# Patient Record
Sex: Female | Born: 1985 | Race: White | Hispanic: No | Marital: Married | Smoking: Never smoker
Health system: Southern US, Community
[De-identification: ages and names within clinical notes are randomized; demographics above are authoritative.]

## PROBLEM LIST (undated history)

## (undated) ENCOUNTER — Inpatient Hospital Stay (HOSPITAL_COMMUNITY): Payer: Self-pay

## (undated) VITALS — BP 101/66 | HR 87 | Temp 97.6°F | Resp 16 | Ht 66.0 in | Wt 226.0 lb

## (undated) VITALS — BP 118/67 | HR 91 | Temp 98.1°F | Resp 18 | Ht 66.0 in | Wt 265.0 lb

## (undated) DIAGNOSIS — F329 Major depressive disorder, single episode, unspecified: Secondary | ICD-10-CM

## (undated) DIAGNOSIS — F909 Attention-deficit hyperactivity disorder, unspecified type: Secondary | ICD-10-CM

## (undated) DIAGNOSIS — O1400 Mild to moderate pre-eclampsia, unspecified trimester: Secondary | ICD-10-CM

## (undated) DIAGNOSIS — Z8619 Personal history of other infectious and parasitic diseases: Secondary | ICD-10-CM

## (undated) DIAGNOSIS — K2970 Gastritis, unspecified, without bleeding: Secondary | ICD-10-CM

## (undated) DIAGNOSIS — K219 Gastro-esophageal reflux disease without esophagitis: Secondary | ICD-10-CM

## (undated) DIAGNOSIS — R87629 Unspecified abnormal cytological findings in specimens from vagina: Secondary | ICD-10-CM

## (undated) DIAGNOSIS — F319 Bipolar disorder, unspecified: Secondary | ICD-10-CM

## (undated) DIAGNOSIS — F32A Depression, unspecified: Secondary | ICD-10-CM

## (undated) DIAGNOSIS — O139 Gestational [pregnancy-induced] hypertension without significant proteinuria, unspecified trimester: Secondary | ICD-10-CM

## (undated) DIAGNOSIS — F419 Anxiety disorder, unspecified: Secondary | ICD-10-CM

## (undated) DIAGNOSIS — A630 Anogenital (venereal) warts: Secondary | ICD-10-CM

## (undated) HISTORY — PX: MOUTH SURGERY: SHX715

## (undated) HISTORY — DX: Anogenital (venereal) warts: A63.0

## (undated) HISTORY — DX: Gastro-esophageal reflux disease without esophagitis: K21.9

## (undated) HISTORY — PX: PLANTAR FASCIA SURGERY: SHX746

## (undated) HISTORY — DX: Unspecified abnormal cytological findings in specimens from vagina: R87.629

## (undated) HISTORY — PX: WISDOM TOOTH EXTRACTION: SHX21

## (undated) HISTORY — DX: Personal history of other infectious and parasitic diseases: Z86.19

## (undated) HISTORY — DX: Gastritis, unspecified, without bleeding: K29.70

## (undated) HISTORY — PX: EYE MUSCLE SURGERY: SHX370

---
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
## 2006-01-05 ENCOUNTER — Emergency Department (HOSPITAL_COMMUNITY): Admission: EM | Admit: 2006-01-05 | Discharge: 2006-01-05 | Payer: Self-pay | Admitting: Family Medicine

## 2006-02-19 ENCOUNTER — Ambulatory Visit (HOSPITAL_COMMUNITY): Payer: Self-pay | Admitting: Psychiatry

## 2006-09-07 ENCOUNTER — Emergency Department (HOSPITAL_COMMUNITY): Admission: EM | Admit: 2006-09-07 | Discharge: 2006-09-07 | Payer: Self-pay | Admitting: Emergency Medicine

## 2006-09-23 ENCOUNTER — Ambulatory Visit: Payer: Self-pay | Admitting: Obstetrics & Gynecology

## 2006-10-04 ENCOUNTER — Ambulatory Visit: Payer: Self-pay | Admitting: Psychiatry

## 2006-10-04 ENCOUNTER — Inpatient Hospital Stay (HOSPITAL_COMMUNITY): Admission: EM | Admit: 2006-10-04 | Discharge: 2006-10-08 | Payer: Self-pay | Admitting: Psychiatry

## 2006-10-14 ENCOUNTER — Ambulatory Visit (HOSPITAL_COMMUNITY): Payer: Self-pay | Admitting: Psychiatry

## 2006-11-13 ENCOUNTER — Ambulatory Visit (HOSPITAL_COMMUNITY): Payer: Self-pay | Admitting: Psychiatry

## 2007-01-01 ENCOUNTER — Emergency Department (HOSPITAL_COMMUNITY): Admission: EM | Admit: 2007-01-01 | Discharge: 2007-01-02 | Payer: Self-pay | Admitting: Emergency Medicine

## 2007-01-06 ENCOUNTER — Ambulatory Visit (HOSPITAL_COMMUNITY): Payer: Self-pay | Admitting: Psychiatry

## 2007-01-25 ENCOUNTER — Ambulatory Visit (HOSPITAL_COMMUNITY): Payer: Self-pay | Admitting: Psychiatry

## 2007-03-08 ENCOUNTER — Ambulatory Visit (HOSPITAL_COMMUNITY): Payer: Self-pay | Admitting: Psychiatry

## 2007-03-24 ENCOUNTER — Emergency Department (HOSPITAL_COMMUNITY): Admission: EM | Admit: 2007-03-24 | Discharge: 2007-03-24 | Payer: Self-pay | Admitting: Family Medicine

## 2007-04-05 ENCOUNTER — Ambulatory Visit (HOSPITAL_COMMUNITY): Payer: Self-pay | Admitting: Psychiatry

## 2007-05-01 ENCOUNTER — Ambulatory Visit: Payer: Self-pay | Admitting: Psychiatry

## 2007-05-01 ENCOUNTER — Inpatient Hospital Stay (HOSPITAL_COMMUNITY): Admission: AD | Admit: 2007-05-01 | Discharge: 2007-05-03 | Payer: Self-pay | Admitting: Psychiatry

## 2007-05-05 ENCOUNTER — Ambulatory Visit (HOSPITAL_COMMUNITY): Payer: Self-pay | Admitting: Psychiatry

## 2007-06-23 ENCOUNTER — Ambulatory Visit (HOSPITAL_COMMUNITY): Payer: Self-pay | Admitting: Psychiatry

## 2007-07-28 ENCOUNTER — Ambulatory Visit (HOSPITAL_COMMUNITY): Payer: Self-pay | Admitting: Psychiatry

## 2007-09-01 ENCOUNTER — Ambulatory Visit (HOSPITAL_COMMUNITY): Payer: Self-pay | Admitting: Psychiatry

## 2007-09-15 ENCOUNTER — Inpatient Hospital Stay (HOSPITAL_COMMUNITY): Admission: RE | Admit: 2007-09-15 | Discharge: 2007-09-17 | Payer: Self-pay | Admitting: *Deleted

## 2007-09-20 ENCOUNTER — Ambulatory Visit (HOSPITAL_COMMUNITY): Payer: Self-pay | Admitting: Psychiatry

## 2007-09-22 ENCOUNTER — Ambulatory Visit: Payer: Self-pay | Admitting: *Deleted

## 2007-10-11 ENCOUNTER — Ambulatory Visit (HOSPITAL_COMMUNITY): Payer: Self-pay | Admitting: Psychiatry

## 2007-11-03 ENCOUNTER — Emergency Department (HOSPITAL_COMMUNITY): Admission: EM | Admit: 2007-11-03 | Discharge: 2007-11-03 | Payer: Self-pay | Admitting: Emergency Medicine

## 2007-11-10 ENCOUNTER — Ambulatory Visit (HOSPITAL_COMMUNITY): Payer: Self-pay | Admitting: Psychiatry

## 2007-12-01 ENCOUNTER — Emergency Department (HOSPITAL_COMMUNITY): Admission: EM | Admit: 2007-12-01 | Discharge: 2007-12-01 | Payer: Self-pay | Admitting: Emergency Medicine

## 2007-12-06 ENCOUNTER — Ambulatory Visit (HOSPITAL_COMMUNITY): Payer: Self-pay | Admitting: Psychiatry

## 2008-01-03 ENCOUNTER — Ambulatory Visit (HOSPITAL_COMMUNITY): Payer: Self-pay | Admitting: Psychiatry

## 2008-02-06 ENCOUNTER — Inpatient Hospital Stay (HOSPITAL_COMMUNITY): Admission: AD | Admit: 2008-02-06 | Discharge: 2008-02-09 | Payer: Self-pay | Admitting: *Deleted

## 2008-02-06 ENCOUNTER — Ambulatory Visit: Payer: Self-pay | Admitting: *Deleted

## 2008-02-23 ENCOUNTER — Ambulatory Visit (HOSPITAL_COMMUNITY): Payer: Self-pay | Admitting: Psychiatry

## 2008-03-22 ENCOUNTER — Ambulatory Visit (HOSPITAL_COMMUNITY): Payer: Self-pay | Admitting: Psychiatry

## 2008-04-16 ENCOUNTER — Emergency Department (HOSPITAL_COMMUNITY): Admission: EM | Admit: 2008-04-16 | Discharge: 2008-04-17 | Payer: Self-pay | Admitting: Emergency Medicine

## 2008-05-24 ENCOUNTER — Ambulatory Visit (HOSPITAL_COMMUNITY): Payer: Self-pay | Admitting: Psychiatry

## 2008-06-23 ENCOUNTER — Ambulatory Visit (HOSPITAL_COMMUNITY): Payer: Self-pay | Admitting: Psychiatry

## 2008-07-10 ENCOUNTER — Ambulatory Visit (HOSPITAL_COMMUNITY): Payer: Self-pay | Admitting: Psychiatry

## 2008-08-23 ENCOUNTER — Ambulatory Visit (HOSPITAL_COMMUNITY): Payer: Self-pay | Admitting: Psychiatry

## 2008-09-12 ENCOUNTER — Ambulatory Visit: Payer: Self-pay | Admitting: *Deleted

## 2008-09-12 ENCOUNTER — Inpatient Hospital Stay (HOSPITAL_COMMUNITY): Admission: RE | Admit: 2008-09-12 | Discharge: 2008-09-14 | Payer: Self-pay | Admitting: *Deleted

## 2008-09-20 ENCOUNTER — Ambulatory Visit (HOSPITAL_COMMUNITY): Payer: Self-pay | Admitting: Psychiatry

## 2008-10-02 ENCOUNTER — Ambulatory Visit (HOSPITAL_COMMUNITY): Payer: Self-pay | Admitting: Psychiatry

## 2008-10-30 ENCOUNTER — Ambulatory Visit (HOSPITAL_COMMUNITY): Payer: Self-pay | Admitting: Psychiatry

## 2008-12-25 ENCOUNTER — Ambulatory Visit (HOSPITAL_COMMUNITY): Payer: Self-pay | Admitting: Psychiatry

## 2009-02-19 ENCOUNTER — Ambulatory Visit (HOSPITAL_COMMUNITY): Payer: Self-pay | Admitting: Psychiatry

## 2009-03-12 ENCOUNTER — Ambulatory Visit (HOSPITAL_COMMUNITY): Payer: Self-pay | Admitting: Psychiatry

## 2009-04-16 ENCOUNTER — Ambulatory Visit (HOSPITAL_COMMUNITY): Payer: Self-pay | Admitting: Psychiatry

## 2009-05-30 ENCOUNTER — Ambulatory Visit (HOSPITAL_COMMUNITY): Payer: Self-pay | Admitting: Psychiatry

## 2009-07-11 ENCOUNTER — Ambulatory Visit (HOSPITAL_COMMUNITY): Payer: Self-pay | Admitting: Psychiatry

## 2009-09-03 ENCOUNTER — Ambulatory Visit (HOSPITAL_COMMUNITY): Payer: Self-pay | Admitting: Psychiatry

## 2009-10-01 ENCOUNTER — Ambulatory Visit (HOSPITAL_COMMUNITY): Payer: Self-pay | Admitting: Psychiatry

## 2009-10-22 ENCOUNTER — Ambulatory Visit (HOSPITAL_COMMUNITY): Payer: Self-pay | Admitting: Psychiatry

## 2009-12-31 ENCOUNTER — Ambulatory Visit (HOSPITAL_COMMUNITY): Payer: Self-pay | Admitting: Psychiatry

## 2010-01-10 ENCOUNTER — Ambulatory Visit: Payer: Self-pay | Admitting: Internal Medicine

## 2010-01-10 DIAGNOSIS — F319 Bipolar disorder, unspecified: Secondary | ICD-10-CM | POA: Insufficient documentation

## 2010-01-10 LAB — CONVERTED CEMR LAB
ALT: 14 units/L (ref 0–35)
AST: 35 units/L (ref 0–37)
Albumin: 4.1 g/dL (ref 3.5–5.2)
Alkaline Phosphatase: 69 units/L (ref 39–117)
BUN: 5 mg/dL — ABNORMAL LOW (ref 6–23)
Basophils Absolute: 0 10*3/uL (ref 0.0–0.1)
Basophils Relative: 0 % (ref 0–1)
CO2: 35 meq/L — ABNORMAL HIGH (ref 19–32)
Calcium: 10.6 mg/dL — ABNORMAL HIGH (ref 8.4–10.5)
Chloride: 100 meq/L (ref 96–112)
Creatinine, Ser: 1 mg/dL (ref 0.40–1.20)
Eosinophils Absolute: 0 10*3/uL (ref 0.0–0.7)
Eosinophils Relative: 0 % (ref 0–5)
Glucose, Bld: 75 mg/dL (ref 70–99)
HCT: 38.8 % (ref 36.0–46.0)
Hemoglobin: 13 g/dL (ref 12.0–15.0)
Lymphocytes Relative: 57 % — ABNORMAL HIGH (ref 12–46)
Lymphs Abs: 1.8 10*3/uL (ref 0.7–4.0)
MCHC: 33.5 g/dL (ref 30.0–36.0)
MCV: 106.6 fL — ABNORMAL HIGH (ref >=78.0)
Monocytes Absolute: 0.3 10*3/uL (ref 0.1–1.0)
Monocytes Relative: 11 % (ref 3–12)
Neutro Abs: 1 10*3/uL — ABNORMAL LOW (ref 1.7–7.7)
Neutrophils Relative %: 32 % — ABNORMAL LOW (ref 43–77)
Platelets: 41 10*3/uL — CL (ref 150–400)
Potassium: 4.6 meq/L (ref 3.5–5.3)
RBC: 3.64 M/uL — ABNORMAL LOW (ref 3.87–5.11)
RDW: 16.3 % — ABNORMAL HIGH (ref 11.5–15.5)
Sodium: 143 meq/L (ref 135–145)
TSH: 5.539 microintl units/mL — ABNORMAL HIGH (ref 0.350–4.5)
Total Bilirubin: 0.8 mg/dL (ref 0.3–1.2)
Total Protein: 6.7 g/dL (ref 6.0–8.3)
Valproic Acid Lvl: 236.8 ug/mL (ref >=50.0)
WBC: 3.2 10*3/uL — ABNORMAL LOW (ref 4.0–10.5)

## 2010-01-11 ENCOUNTER — Telehealth: Payer: Self-pay | Admitting: *Deleted

## 2010-01-11 ENCOUNTER — Encounter: Payer: Self-pay | Admitting: Internal Medicine

## 2010-01-11 ENCOUNTER — Inpatient Hospital Stay (HOSPITAL_COMMUNITY): Admission: AD | Admit: 2010-01-11 | Discharge: 2010-01-15 | Payer: Self-pay | Admitting: Infectious Diseases

## 2010-01-11 ENCOUNTER — Ambulatory Visit: Payer: Self-pay | Admitting: Internal Medicine

## 2010-01-11 ENCOUNTER — Telehealth (INDEPENDENT_AMBULATORY_CARE_PROVIDER_SITE_OTHER): Payer: Self-pay | Admitting: Internal Medicine

## 2010-01-11 ENCOUNTER — Encounter: Payer: Self-pay | Admitting: *Deleted

## 2010-01-11 ENCOUNTER — Ambulatory Visit: Payer: Self-pay | Admitting: Infectious Diseases

## 2010-01-12 ENCOUNTER — Encounter: Payer: Self-pay | Admitting: Internal Medicine

## 2010-01-15 ENCOUNTER — Telehealth: Payer: Self-pay | Admitting: *Deleted

## 2010-01-16 LAB — CONVERTED CEMR LAB
Basophils Absolute: 0 10*3/uL (ref 0.0–0.1)
Basophils Relative: 0 % (ref 0–1)
Eosinophils Absolute: 0 10*3/uL (ref 0.0–0.7)
Eosinophils Relative: 0 % (ref 0–5)
HCT: 39.3 % (ref 36.0–46.0)
Hemoglobin: 13.6 g/dL (ref 12.0–15.0)
Lymphocytes Relative: 45 % (ref 12–46)
Lymphs Abs: 1 10*3/uL (ref 0.7–4.0)
MCHC: 34.7 g/dL (ref 30.0–36.0)
MCV: 108 fL — ABNORMAL HIGH (ref >=78.0)
Monocytes Absolute: 0.2 10*3/uL (ref 0.1–1.0)
Monocytes Relative: 8 % (ref 3–12)
Neutro Abs: 1 10*3/uL — ABNORMAL LOW (ref 1.7–7.7)
Neutrophils Relative %: 46 % (ref 43–77)
Platelets: 37 10*3/uL — CL (ref 150–400)
RBC: 3.64 M/uL — ABNORMAL LOW (ref 3.87–5.11)
RDW: 17.5 % — ABNORMAL HIGH (ref 11.5–15.5)
Valproic Acid Lvl: 197.1 ug/mL (ref >=50.0)
WBC: 2.2 10*3/uL — ABNORMAL LOW (ref 4.0–10.5)

## 2010-01-17 ENCOUNTER — Ambulatory Visit: Payer: Self-pay | Admitting: Internal Medicine

## 2010-01-17 LAB — CONVERTED CEMR LAB
Basophils Absolute: 0 10*3/uL (ref 0.0–0.1)
Basophils Relative: 0 % (ref 0–1)
Eosinophils Absolute: 0 10*3/uL (ref 0.0–0.7)
Eosinophils Relative: 0 % (ref 0–5)
Free T4: 1.29 ng/dL (ref 0.80–1.80)
HCT: 36.9 % (ref 36.0–46.0)
Hemoglobin: 12.7 g/dL (ref 12.0–15.0)
Lymphocytes Relative: 40 % (ref 12–46)
Lymphs Abs: 1.4 10*3/uL (ref 0.7–4.0)
MCHC: 34.3 g/dL (ref 30.0–36.0)
MCV: 108.7 fL — ABNORMAL HIGH (ref >=78.0)
Monocytes Absolute: 0.6 10*3/uL (ref 0.1–1.0)
Monocytes Relative: 16 % — ABNORMAL HIGH (ref 3–12)
Neutro Abs: 1.6 10*3/uL — ABNORMAL LOW (ref 1.7–7.7)
Neutrophils Relative %: 44 % (ref 43–77)
Platelets: 75 10*3/uL — ABNORMAL LOW (ref 150–400)
RBC Folate: 324 ng/mL (ref 180–600)
RBC: 3.39 M/uL — ABNORMAL LOW (ref 3.87–5.11)
RDW: 17.9 % — ABNORMAL HIGH (ref 11.5–15.5)
TSH: 0.806 microintl units/mL (ref 0.350–4.5)
Valproic Acid Lvl: <10 ug/mL — ABNORMAL LOW (ref >=50.0)
Vitamin B-12: 1432 pg/mL — ABNORMAL HIGH (ref 211–911)
WBC: 3.7 10*3/uL — ABNORMAL LOW (ref 4.0–10.5)

## 2010-01-23 ENCOUNTER — Telehealth (INDEPENDENT_AMBULATORY_CARE_PROVIDER_SITE_OTHER): Payer: Self-pay | Admitting: *Deleted

## 2010-02-06 ENCOUNTER — Ambulatory Visit (HOSPITAL_COMMUNITY): Payer: Self-pay | Admitting: Psychiatry

## 2010-02-07 ENCOUNTER — Encounter: Payer: Self-pay | Admitting: Internal Medicine

## 2010-02-11 ENCOUNTER — Ambulatory Visit: Payer: Self-pay | Admitting: Internal Medicine

## 2010-02-11 LAB — CONVERTED CEMR LAB
BUN: 8 mg/dL (ref 6–23)
Basophils Absolute: 0 10*3/uL (ref 0.0–0.1)
Basophils Relative: 0 % (ref 0–1)
CO2: 26 meq/L (ref 19–32)
Calcium: 9.6 mg/dL (ref 8.4–10.5)
Chloride: 106 meq/L (ref 96–112)
Creatinine, Ser: 0.77 mg/dL (ref 0.40–1.20)
Eosinophils Absolute: 0 10*3/uL (ref 0.0–0.7)
Eosinophils Relative: 0 % (ref 0–5)
Glucose, Bld: 83 mg/dL (ref 70–99)
HCT: 38.5 % (ref 36.0–46.0)
Hemoglobin: 12.9 g/dL (ref 12.0–15.0)
Lymphocytes Relative: 52 % — ABNORMAL HIGH (ref 12–46)
Lymphs Abs: 1.6 10*3/uL (ref 0.7–4.0)
MCHC: 33.5 g/dL (ref 30.0–36.0)
MCV: 102.1 fL — ABNORMAL HIGH (ref >=78.0)
Monocytes Absolute: 0.2 10*3/uL (ref 0.1–1.0)
Monocytes Relative: 6 % (ref 3–12)
Neutro Abs: 1.3 10*3/uL — ABNORMAL LOW (ref 1.7–7.7)
Neutrophils Relative %: 42 % — ABNORMAL LOW (ref 43–77)
Platelets: 126 10*3/uL — ABNORMAL LOW (ref 150–400)
Potassium: 4.9 meq/L (ref 3.5–5.3)
RBC: 3.77 M/uL — ABNORMAL LOW (ref 3.87–5.11)
RDW: 13.1 % (ref 11.5–15.5)
Sodium: 142 meq/L (ref 135–145)
WBC: 3.2 10*3/uL — ABNORMAL LOW (ref 4.0–10.5)

## 2010-02-18 ENCOUNTER — Telehealth: Payer: Self-pay | Admitting: Internal Medicine

## 2010-02-18 ENCOUNTER — Ambulatory Visit (HOSPITAL_COMMUNITY): Payer: Self-pay | Admitting: Psychiatry

## 2010-02-26 ENCOUNTER — Ambulatory Visit: Payer: Self-pay | Admitting: Internal Medicine

## 2010-02-26 ENCOUNTER — Encounter: Payer: Self-pay | Admitting: Internal Medicine

## 2010-02-26 LAB — CONVERTED CEMR LAB
Basophils Absolute: 0 10*3/uL (ref 0.0–0.1)
Basophils Relative: 0 % (ref 0–1)
Eosinophils Absolute: 0 10*3/uL (ref 0.0–0.7)
Eosinophils Relative: 0 % (ref 0–5)
HCT: 34.9 % — ABNORMAL LOW (ref 36.0–46.0)
Hemoglobin: 11.2 g/dL — ABNORMAL LOW (ref 12.0–15.0)
Lymphocytes Relative: 39 % (ref 12–46)
Lymphs Abs: 1.7 10*3/uL (ref 0.7–4.0)
MCHC: 32.1 g/dL (ref 30.0–36.0)
MCV: 103.9 fL — ABNORMAL HIGH (ref >=78.0)
Monocytes Absolute: 0.2 10*3/uL (ref 0.1–1.0)
Monocytes Relative: 4 % (ref 3–12)
Neutro Abs: 2.5 10*3/uL (ref 1.7–7.7)
Neutrophils Relative %: 57 % (ref 43–77)
Platelets: 167 10*3/uL (ref 150–400)
RBC: 3.36 M/uL — ABNORMAL LOW (ref 3.87–5.11)
RDW: 13.5 % (ref 11.5–15.5)
WBC: 4.3 10*3/uL (ref 4.0–10.5)

## 2010-03-04 ENCOUNTER — Ambulatory Visit (HOSPITAL_COMMUNITY): Payer: Self-pay | Admitting: Psychiatry

## 2010-03-05 ENCOUNTER — Ambulatory Visit: Payer: Self-pay | Admitting: Internal Medicine

## 2010-03-18 ENCOUNTER — Ambulatory Visit (HOSPITAL_COMMUNITY): Payer: Self-pay | Admitting: Psychiatry

## 2010-04-12 ENCOUNTER — Ambulatory Visit (HOSPITAL_COMMUNITY): Payer: Self-pay | Admitting: Psychiatry

## 2010-05-06 ENCOUNTER — Ambulatory Visit (HOSPITAL_COMMUNITY): Payer: Self-pay | Admitting: Psychiatry

## 2010-05-26 ENCOUNTER — Emergency Department (HOSPITAL_COMMUNITY): Admission: EM | Admit: 2010-05-26 | Discharge: 2010-05-26 | Payer: Self-pay | Admitting: Emergency Medicine

## 2010-06-03 ENCOUNTER — Ambulatory Visit (HOSPITAL_COMMUNITY): Payer: Self-pay | Admitting: Psychiatry

## 2010-06-19 ENCOUNTER — Ambulatory Visit (HOSPITAL_COMMUNITY): Payer: Self-pay | Admitting: Psychiatry

## 2010-06-24 ENCOUNTER — Emergency Department (HOSPITAL_COMMUNITY): Admission: EM | Admit: 2010-06-24 | Discharge: 2010-06-24 | Payer: Self-pay | Admitting: Family Medicine

## 2010-07-02 ENCOUNTER — Ambulatory Visit: Payer: Self-pay | Admitting: Internal Medicine

## 2010-07-02 LAB — CONVERTED CEMR LAB
ALT: 8 units/L (ref 0–35)
AST: 14 units/L (ref 0–37)
Albumin: 4.8 g/dL (ref 3.5–5.2)
Alkaline Phosphatase: 81 units/L (ref 39–117)
BUN: 10 mg/dL (ref 6–23)
Basophils Absolute: 0 10*3/uL (ref 0.0–0.1)
Basophils Relative: 0 % (ref 0–1)
CO2: 26 meq/L (ref 19–32)
Calcium: 9.9 mg/dL (ref 8.4–10.5)
Chloride: 104 meq/L (ref 96–112)
Creatinine, Ser: 0.94 mg/dL (ref 0.40–1.20)
Eosinophils Absolute: 0 10*3/uL (ref 0.0–0.7)
Eosinophils Relative: 0 % (ref 0–5)
Glucose, Bld: 95 mg/dL (ref 70–99)
HCT: 42.5 % (ref 36.0–46.0)
Hemoglobin: 13.3 g/dL (ref 12.0–15.0)
Lymphocytes Relative: 41 % (ref 12–46)
Lymphs Abs: 1.6 10*3/uL (ref 0.7–4.0)
MCHC: 31.3 g/dL (ref 30.0–36.0)
MCV: 94.4 fL (ref >=78.0)
Monocytes Absolute: 0.1 10*3/uL (ref 0.1–1.0)
Monocytes Relative: 2 % — ABNORMAL LOW (ref 3–12)
Neutro Abs: 2.3 10*3/uL (ref 1.7–7.7)
Neutrophils Relative %: 57 % (ref 43–77)
Platelets: 174 10*3/uL (ref 150–400)
Potassium: 5.1 meq/L (ref 3.5–5.3)
RBC: 4.5 M/uL (ref 3.87–5.11)
RDW: 13.9 % (ref 11.5–15.5)
Sodium: 141 meq/L (ref 135–145)
Total Bilirubin: 0.2 mg/dL — ABNORMAL LOW (ref 0.3–1.2)
Total Protein: 7.5 g/dL (ref 6.0–8.3)
WBC: 4 10*3/uL (ref 4.0–10.5)

## 2010-07-03 ENCOUNTER — Telehealth: Payer: Self-pay | Admitting: Internal Medicine

## 2010-07-23 ENCOUNTER — Other Ambulatory Visit: Payer: Self-pay

## 2010-07-23 ENCOUNTER — Ambulatory Visit: Payer: Self-pay | Admitting: Psychiatry

## 2010-07-23 ENCOUNTER — Inpatient Hospital Stay (HOSPITAL_COMMUNITY): Admission: RE | Admit: 2010-07-23 | Discharge: 2010-07-29 | Payer: Self-pay | Admitting: Psychiatry

## 2010-07-23 ENCOUNTER — Telehealth: Payer: Self-pay | Admitting: *Deleted

## 2010-07-31 ENCOUNTER — Ambulatory Visit (HOSPITAL_COMMUNITY): Payer: Self-pay | Admitting: Psychiatry

## 2010-08-28 ENCOUNTER — Ambulatory Visit: Payer: Self-pay | Admitting: Internal Medicine

## 2010-08-28 LAB — CONVERTED CEMR LAB: Beta hcg, urine, semiquantitative: NEGATIVE

## 2010-09-02 ENCOUNTER — Ambulatory Visit (HOSPITAL_COMMUNITY): Payer: Self-pay | Admitting: Psychiatry

## 2010-10-02 ENCOUNTER — Ambulatory Visit: Payer: Self-pay | Admitting: Internal Medicine

## 2010-10-02 LAB — CONVERTED CEMR LAB
Chlamydia, DNA Probe: NEGATIVE
GC Probe Amp, Genital: NEGATIVE
Pap Smear: NEGATIVE

## 2010-10-03 ENCOUNTER — Encounter: Payer: Self-pay | Admitting: Internal Medicine

## 2010-10-03 LAB — CONVERTED CEMR LAB
Candida species: NEGATIVE
Gardnerella vaginalis: NEGATIVE

## 2010-10-15 ENCOUNTER — Telehealth: Payer: Self-pay | Admitting: *Deleted

## 2010-10-25 ENCOUNTER — Emergency Department (HOSPITAL_COMMUNITY): Admission: EM | Admit: 2010-10-25 | Discharge: 2010-10-25 | Payer: Self-pay | Admitting: Family Medicine

## 2010-11-08 ENCOUNTER — Ambulatory Visit: Payer: Self-pay | Admitting: Psychiatry

## 2010-11-08 ENCOUNTER — Inpatient Hospital Stay (HOSPITAL_COMMUNITY)
Admission: RE | Admit: 2010-11-08 | Discharge: 2010-11-12 | Payer: Self-pay | Source: Home / Self Care | Admitting: Psychiatry

## 2010-11-20 ENCOUNTER — Ambulatory Visit: Payer: Self-pay | Admitting: Internal Medicine

## 2010-11-27 ENCOUNTER — Ambulatory Visit (HOSPITAL_COMMUNITY): Payer: Self-pay | Admitting: Psychiatry

## 2011-01-14 NOTE — Progress Notes (Signed)
Summary: Medication Changes//kg  Phone Note Other Incoming   Caller: Dr Jorja Loa Summary of Call: Received call from Dr Jorja Loa.  After reviewing pt's labs from 2/28, he would like to cut both the Abilify and Ritalin in half.  She will now be taking  Abilify 20mg  once daily  and Ritalin 20mg  once daily.  He would also like to have a repeat cbc in one week and in platlets remain low,suggested Dr Phillips Odor doing make a hematology referral.  Will contact pt to make sure she and her mother are aware of this and will also forward note to Dr Phillips Odor for review.Krystal Eaton [facility])  February 18, 2010 3:48 PM   Follow-up for Phone Call        Pt's mother not in, female on other end said to call back in .Krystal Eaton [facility])  February 18, 2010 4:20 PM Pt's mother contacted the clinic, she was actually in the office when Dr Jorja Loa called.  Pt will have repeat cbc drawn in our office on Tue March 15th around 3pm, will  put in order for cbc and forward note to Dr Phillips Odor for signature.Krystal Eaton [facility])  February 18, 2010 4:57 PM   Additional Follow-up for Phone Call Additional follow up Details #1::        Thank you. Agree with plan. I will sign orders and follow results. I a almost crtain this leukopenia is from the drugs- will see how next set of labs are before referring to heme.  Additional Follow-up by: Julaine Fusi  DO,  February 21, 2010 7:30 PM     Process Orders Check Orders Results:     [facility]: Check successful Tests Sent for requisitioning (February 21, 2010 7:29 PM):     02/26/2010: [facility] -- [facility] w/Diff [[id number]] (signed)

## 2011-01-14 NOTE — Miscellaneous (Signed)
Summary: Discussion with Arfeen  Clinical Lists Changes Spoke with Dr. Stacie Glaze started on Lamictal. Will check labs in 1 week. Orders: Added new Test order of T-Comprehensive Metabolic Panel [id number]) - Signed Added new Test order of T-CBC w/Diff [id number]) - Signed

## 2011-01-14 NOTE — Assessment & Plan Note (Signed)
Summary: FU VISIT/DS   Vital Signs:  Patient profile:   25 year old female Height:      64.75 inches (164.47 cm) Weight:      153.06 pounds (69.57 kg) BMI:     25.76 Temp:     98.8 degrees F (37.11 degrees C) oral Pulse rate:   96 / minute BP sitting:   116 / 72  (left arm)  Vitals Entered By: Angelina Ok RN (July 02, 2010 9:05 AM) CC: Depression, Preventive Care Pain Assessment Patient in pain? yes     Location: ABDOMINAL Intensity: 10 Type: Cramps Onset of pain  On period Nutritional Status BMI of 25 - 29 = overweight  Have you ever been in a relationship where you felt threatened, hurt or afraid?No   Does patient need assistance? Functional Status Self care Ambulation Normal Comments Check up.  Having period cramps.   CC:  Depression and Preventive Care.  History of Present Illness: [name] comes in for routine follow-up. She is requesting OCPs and has a yeast infection treated by UC. her mental health is much improved and her lab work has stabilized. No other copmplaints today. Continues with multiple psycho-social problems and problems controlling impulsive behavior. Vaginal infection symptoms improving.   Depression History:      The patient denies a depressed mood most of the day and a diminished interest in her usual daily activities.  Positive alarm features for depression include impaired concentration (indecisiveness).  However, she denies significant weight loss, insomnia, hypersomnia, psychomotor agitation, psychomotor retardation, fatigue (loss of energy), feelings of worthlessness (guilt), and recurrent thoughts of death or suicide.        The patient denies that she feels like life is not worth living, denies that she wishes that she were dead, and denies that she has thought about ending her life.         Preventive Screening-Counseling & Management  Alcohol-Tobacco     Alcohol drinks/day: 0     Smoking Status: never  Current Medications  (verified): 1)  Ritalin La 40 Mg Xr24h-Cap (Methylphenidate Hcl) .... Take 1 Tablet By Mouth Once A Day 2)  Risperdal 1 Mg Tabs (Risperidone) .... Take 1 Tablet By Mouth Once A Day 3)  Trazodone Hcl 50 Mg Tabs (Trazodone Hcl) .... Take 1 Tab By Mouth At Bedtime 4)  Lamictal 25 Mg Tabs (Lamotrigine) 5)  Naproxen 500 Mg Tabs (Naproxen) .... Take 1 Tablet By Mouth Two Times A Day As Needed For Pain 6)  Seasonique 0.15-0.03 &0.01 Mg Tabs (Levonorgest-Eth Estrad 91-Day) .... Take 1 Tablet By Mouth Once A Day As Directed  Allergies (verified): No Known Drug Allergies  Family History: Reviewed history from 01/10/2010 and no changes required. She has a biologic father who has a history of bipolar disorder and alcohol abuse. Maternal Grandmother died of Chronic Obstructive Lung Disease and Breast Cancer.  Social History: Reviewed history and no changes required. Lives at home with her Mother [name] who is primary Guardian and Clinical research associate. 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  Patient is smiling, happy, energetic and cooperative with examination. Eyes:  Mild Strabismus, wears glasses Lungs:  normal respiratory effort and normal breath sounds.   Heart:  normal  rate, regular rhythm, and no murmur.   Abdomen:  soft and non-tender.   Msk:  normal ROM and no joint tenderness.   Neurologic:  alert & oriented X3, cranial nerves II-XII intact, strength normal in all extremities, sensation intact to light touch, and gait normal.   Skin:  No rashes Psych:  at baseline- not agitated, behavior is appropriate   Impression & Recommendations:  Problem # 1:  BIPOLAR DISORDER UNSPECIFIED (ICD-296.80) Continue meds  as presribed by Dr. Lolly Mustache will check lab work today including blood counts and liver function. will send results to Dr. Robert Bellow office.  Orders: T-Comprehensive Metabolic Panel ([id number]) T-CBC w/Diff ([id number])  Problem # 2:  MENSTRUAL PAIN (ICD-625.3) NSAIDS as needed. Will send in script for Naproxen.  Problem # 3:  WARTS, MULTIPLE (ICD-078.10) Will refer to dermatologist for multiple wart removals (extensor surfaces of hands, elbows, knees, foot, also reports presence of gential warts). Has tried topical treatments without success.   Orders: Dermatology Referral (Derma)  Problem # 4:  Preventive Health Care (ICD-V70.0) Will need PAP at next visit. Will prescribe Birth control pills today- patient asking for continuous packs. Also recommended f/u at [facility] for other [facility] options.   Complete Medication List: 1)  Ritalin La 40 Mg Xr24h-cap (Methylphenidate hcl) .... Take 1 tablet by mouth once a day 2)  Risperdal 1 Mg Tabs (Risperidone) .... Take 1 tablet by mouth once a day 3)  Trazodone Hcl 50 Mg Tabs (Trazodone hcl) .... Take 1 tab by mouth at bedtime 4)  Lamictal 25 Mg Tabs (Lamotrigine) 5)  Naproxen 500 Mg Tabs (Naproxen) .... Take 1 tablet by mouth two times a day as needed for pain 6)  Seasonique 0.15-0.03 &0.01 Mg Tabs (Levonorgest-eth estrad 91-day) .... Take 1 tablet by mouth once a day as directed  Osteoporosis Risk Assessment:  Risk Factors for Fracture or Low Bone Density:   Race (White or Asian):     yes   Smoking status:       never  Patient Instructions: 1)  Please schedule a follow-up appointment in 3-6 months, sooner if needed. Prescriptions: NAPROXEN 500 MG TABS (NAPROXEN) Take 1 tablet by mouth two times a day as needed for pain  #60 x 2   Entered and Authorized by:   Julaine Fusi  DO   Signed by:   Julaine Fusi  DO on 07/02/2010   Method used:   Electronically to        [facility]  [address] [id number] (retail)       [address]       [place], [place]  [address]       Ph: [phone] or [phone]       Fax: [phone]   RxID:   [id number] SEASONIQUE 0.15-0.03 &0.01 MG TABS (LEVONORGEST-ETH ESTRAD 91-DAY) Take 1 tablet by mouth once a day as directed  #1 pak x 3   Entered and Authorized by:   Julaine Fusi  DO   Signed by:   Julaine Fusi  DO on 07/02/2010   Method used:   [facility]        [facility]  [address] [id number] ([facility]       [address]       [place], [place]  [address]       Ph: [phone] or [phone]       Fax: [phone]   RxID:   [id number]  NAPROXEN 500 MG TABS (NAPROXEN) Take 1 tablet by mouth two times a day as needed for pain  #60 x 2   Entered and Authorized by:   Julaine Fusi  DO   Signed by:   Julaine Fusi  DO on 07/02/2010   Method used:   Print then Give to Patient   RxID:   [id number]   Prevention & Chronic Care Immunizations   Influenza vaccine: Not documented    Tetanus booster: Not documented    Pneumococcal vaccine: Not documented  Other Screening   Pap smear: Not documented   Smoking status: never  (07/02/2010)  Process Orders Check Orders Results:     [facility]: Check successful Tests Sent for requisitioning (July 11, 2010 10:27 PM):     07/02/2010: [facility] -- T-Comprehensive Metabolic Panel [[id number]] (signed)     07/02/2010: [facility] -- T-CBC w/Diff [[id number]] (signed)     Vital Signs:  Patient profile:   25 year old female Height:      64.75 inches (164.47 cm) Weight:      153.06 pounds (69.57 kg) BMI:     25.76 Temp:     98.8 degrees F (37.11 degrees C) oral Pulse rate:   96 / minute BP sitting:   116 / 72  (left arm)  Vitals Entered By: Angelina Ok RN (July 02, 2010 9:05 AM)   Process Orders Check Orders Results:     [facility]: Check successful Tests Sent for requisitioning (July 11, 2010 10:27 PM):     07/02/2010:  [facility] -- T-Comprehensive Metabolic Panel [[id number]] (signed)     07/02/2010: [facility] -- T-CBC w/Diff [[id number]] (signed)

## 2011-01-14 NOTE — Assessment & Plan Note (Signed)
Summary: check up[mkj]   Vital Signs:  Patient profile:   25 year old female Height:      64.75 inches (164.47 cm) Weight:      133.04 pounds (60.47 kg) BMI:     22.39 Temp:     97.3 degrees F (36.28 degrees C) oral Pulse rate:   78 / minute BP sitting:   107 / 62  (right arm)  Vitals Entered By: Angelina Ok RN (March 05, 2010 1:31 PM) CC: Depression Is Patient Diabetic? No Pain Assessment Patient in pain? no      Nutritional Status BMI of 19 -24 = normal  Have you ever been in a relationship where you felt threatened, hurt or afraid?No   Does patient need assistance? Functional Status Self care Ambulation Normal Comments Check up. Needs of labs.  Problems with Dr. Lolly Mustache.  Had put pt on Depokoke that caused problems.     CC:  Depression.  History of Present Illness: [name] comes ion today for routine follow-up. She is doing much better after starting the Lamictal and reducing her doses of teh Abilify and Ritalin. Her blood counts have also been gradually improving. She does not complain of anymore problems with her balance or slurring of her speech. Her URi has resolved. In terms of her behavior her mother says that she is still extremely impulsive and unpredicatable. She has frequent "temper tantrums" according to her mother. Often troubled by anger outbursts and cannot handle basic life stress and frustration normally. She has 2 new tatoos on her arm- her mother advised against these but a friend apparently did them for free. She has gained some weight.   Depression History:      The patient is having a depressed mood most of the day and has a diminished interest in her usual daily activities.        The patient denies that she feels like life is not worth living, denies that she wishes that she were dead, and denies that she has thought about ending her life.         Preventive Screening-Counseling & Management  Alcohol-Tobacco     Alcohol drinks/day: 0     Smoking  Status: never  Current Medications (verified): 1)  Ritalin La 40 Mg Xr24h-Cap (Methylphenidate Hcl) .... Take 1 Tablet By Mouth Once A Day 2)  Abilify 20 Mg Tabs (Aripiprazole) .... 2 Tabs Daily By Mouth Daily 3)  Trazodone Hcl 50 Mg Tabs (Trazodone Hcl) .... Take 1 Tab By Mouth At Bedtime 4)  Cheratussin Ac 100-10 Mg/1ml Syrp (Guaifenesin-Codeine) .... 5ml By Mouth Q 6 Hours As Needed For Cough and Congestion 5)  Lamictal 25 Mg Tabs (Lamotrigine)  Allergies (verified): No Known Drug Allergies  Review of Systems      See HPI  Physical Exam  General:  alert, well-developed, well-nourished, and well-hydrated.  Patient is smiling, happy, energetic and cooperative with examination. Nose:  External nasal examination shows no deformity or inflammation. Nasal mucosa are pink and moist without lesions or exudates. Mouth:  poor dentition.   Neck:  supple and no masses.   Lungs:  normal respiratory effort and normal breath sounds.   Heart:  normal rate, regular rhythm, and no murmur.   Abdomen:  soft and non-tender.   Msk:  normal ROM and no joint tenderness.   Extremities:  No clubbing, cyanosis, edema, or deformity noted with normal full range of motion of all joints.   Neurologic:  alert & oriented X3, cranial  nerves II-XII intact, strength normal in all extremities, sensation intact to light touch, and gait normal.   Psych:  easily distracted, judgment poor, and hyperactive.     Impression & Recommendations:  Problem # 1:  BIPOLAR DISORDER UNSPECIFIED (ICD-296.80) Still not at an acceptable baseline for her day to day functuioning. She is in weekly counseling for her emotional issues. Dr. Lolly Mustache recently increased her Lamictal. No complications with teh new medication-no rashes or otehr complaints. Her ritalin and Abilfy were reduced. I will continue to follow her lab work at least every three months.  Problem # 2:  UPPER RESPIRATORY INFECTION (ICD-465.9) Resolved. Lingering dry hacking  cough- post-viral cough likely- could last for 3-6 weeks. Ok to continue PM cough medicine. Her updated medication list for this problem includes:    Cheratussin Ac 100-10 Mg/29ml Syrp (Guaifenesin-codeine) .Marland KitchenMarland KitchenMarland KitchenMarland Kitchen 5ml by mouth q 6 hours as needed for cough and congestion  Problem # 3:  THROMBOCYTOPENIA (ICD-287.5) Will check labs in 4 weeks.  Complete Medication List: 1)  Ritalin La 40 Mg Xr24h-cap (Methylphenidate hcl) .... Take 1 tablet by mouth once a day 2)  Abilify 20 Mg Tabs (Aripiprazole) .... 2 tabs daily by mouth daily 3)  Trazodone Hcl 50 Mg Tabs (Trazodone hcl) .... Take 1 tab by mouth at bedtime 4)  Cheratussin Ac 100-10 Mg/75ml Syrp (Guaifenesin-codeine) .... 5ml by mouth q 6 hours as needed for cough and congestion 5)  Lamictal 25 Mg Tabs (Lamotrigine)  Other Orders: Future Orders: T-CBC w/Diff ([id number]) ... 02/18/2011  Patient Instructions: 1)  Lab only appointment in 4 weeks. 2)  F/U in 3 months sooner if needed.  Prevention & Chronic Care Immunizations   Influenza vaccine: Not documented    Tetanus booster: Not documented    Pneumococcal vaccine: Not documented  Other Screening   Pap smear: Not documented   Smoking status: never  (03/05/2010)  Process Orders Check Orders Results:     [facility]: Check successful Tests Sent for requisitioning (March 21, 2010 9:19 PM):     02/18/2011: [facility] -- [facility] w/Diff [[id number]] (signed)

## 2011-01-14 NOTE — Assessment & Plan Note (Signed)
Summary: dry,hacking cough, productive at times-yellow, T 102/pcp-gold...   Vital Signs:  Patient profile:   25 year old female Height:      64.75 inches (164.47 cm) Weight:      125.3 pounds (56.95 kg) BMI:     21.09 Temp:     97.2 degrees F (36.22 degrees C) oral Pulse rate:   107 / minute BP sitting:   117 / 91  (left arm)  Vitals Entered By: Stanton Kidney Ditzler RN (February 11, 2010 4:58 PM) CC: Depression Is Patient Diabetic? No Pain Assessment Patient in pain? yes     Location: throat Intensity: 8 Type: sore Onset of pain  past 2 weeks Nutritional Status Detail appetite down  Have you ever been in a relationship where you felt threatened, hurt or afraid?denies   Does patient need assistance? Functional Status Self care Ambulation Normal Comments Mother with pt. Past 2 weeks off and on sore throat and yellow productive cough. Needs labs.   CC:  Depression.  History of Present Illness: [name] comes in with 2 week history of sinus congestion cough and intermittent fevers. Cough is productive. Nasal congestion is purulent and she has associated facila pain. Decreased appetite.  Depression History:      The patient denies a depressed mood most of the day and a diminished interest in her usual daily activities.        The patient denies that she feels like life is not worth living, denies that she wishes that she were dead, and denies that she has thought about ending her life.        Comments:  Followed by Dr. Lolly Mustache. Mother is primary caregiver and supervises medication.   Preventive Screening-Counseling & Management  Alcohol-Tobacco     Alcohol drinks/day: 0     Smoking Status: never  Current Medications (verified): 1)  Ritalin La 40 Mg Xr24h-Cap (Methylphenidate Hcl) .... Take 1 Tablet By Mouth Once A Day 2)  Abilify 20 Mg Tabs (Aripiprazole) .... 2 Tabs Daily By Mouth Daily 3)  Trazodone Hcl 50 Mg Tabs (Trazodone Hcl) .... Take 1 Tab By Mouth At Bedtime 4)   Augmentin 500-125 Mg Tabs (Amoxicillin-Pot Clavulanate) .... Take 1 Tablet By Mouth Two Times A Day 5)  Cheratussin Ac 100-10 Mg/32ml Syrp (Guaifenesin-Codeine) .... 5ml By Mouth Q 6 Hours As Needed For Cough and Congestion  Allergies (verified): No Known Drug Allergies  Physical Exam  General:  alert, well-developed, well-nourished, and well-hydrated.  Patient is smiling, happy, energetic and cooperative with examination. Ears:  bilateral inflammed TM Nose:  nasal dischargemucosal pallor.   Mouth:  pharyngeal erythema and poor dentition.   Neck:  supple and no masses.   Lungs:  normal respiratory effort and normal breath sounds.   Heart:  normal rate, regular rhythm, and no murmur.   Abdomen:  soft and non-tender.   Skin:  no rashes.   Psych:  Oriented X3, memory intact for recent and remote, and labile affect.     Impression & Recommendations:  Problem # 1:  UPPER RESPIRATORY INFECTION (ICD-465.9)  Will treat with Augmentin for 10 days since this is an ongoing problem. Exam and history consistent with sinusitis and bronchitis. Will give PM cough medicine.  Her updated medication list for this problem includes:    Cheratussin Ac 100-10 Mg/28ml Syrp (Guaifenesin-codeine) .Marland KitchenMarland KitchenMarland KitchenMarland Kitchen 5ml by mouth q 6 hours as needed for cough and congestion  Problem # 2:  BIPOLAR DISORDER UNSPECIFIED (ICD-296.80) Will check platelets today. Hx of depakote  tox. Now on Lamictal. No side effects- doing better. Mother still concerned about weightloss.  Complete Medication List: 1)  Ritalin La 40 Mg Xr24h-cap (Methylphenidate hcl) .... Take 1 tablet by mouth once a day 2)  Abilify 20 Mg Tabs (Aripiprazole) .... 2 tabs daily by mouth daily 3)  Trazodone Hcl 50 Mg Tabs (Trazodone hcl) .... Take 1 tab by mouth at bedtime 4)  Augmentin 500-125 Mg Tabs (Amoxicillin-pot clavulanate) .... Take 1 tablet by mouth two times a day 5)  Cheratussin Ac 100-10 Mg/77ml Syrp (Guaifenesin-codeine) .... 5ml by mouth q 6 hours as  needed for cough and congestion 6)  Lamictal 25 Mg Tabs (Lamotrigine)  Other Orders: T-Basic Metabolic Panel [id number]) T-CBC w/Diff [id number])  Patient Instructions: 1)  Please schedule a follow-up appointment in 1 month sooner if not better. Prescriptions: CHERATUSSIN AC 100-10 MG/5ML SYRP (GUAIFENESIN-CODEINE) 5ml by mouth q 6 hours as needed for cough and congestion  #174ml x 0   Entered and Authorized by:   Julaine Fusi  DO   Signed by:   Julaine Fusi  DO on 02/11/2010   Method used:   Print then Give to Patient   RxID:   [id number] AUGMENTIN 500-125 MG TABS (AMOXICILLIN-POT CLAVULANATE) Take 1 tablet by mouth two times a day  #20 x 0   Entered and Authorized by:   Julaine Fusi  DO   Signed by:   Julaine Fusi  DO on 02/11/2010   Method used:   Electronically to        [facility] [address] [phone]* (retail)       [address]       [place], [place]  [address]       Ph: [phone]       Fax: [phone]   RxID:   [id number]  Process Orders Check Orders Results:     [facility]: Order checked:     Vassie Loll MD NOT AUTHORIZED TO ORDER Tests Sent for requisitioning (February 12, 2010 11:12 AM):     02/11/2010: [facility] -- T-Basic Metabolic Panel [id number] (signed)     02/11/2010: [facility] -- [facility] w/Diff [[id number]] (signed)    Prevention & Chronic Care Immunizations   Influenza vaccine: Not documented    Tetanus booster: Not documented    Pneumococcal vaccine: Not documented  Other Screening   Pap smear: Not documented   Smoking status: never  (02/11/2010)      Resource handout printed.

## 2011-01-14 NOTE — Assessment & Plan Note (Signed)
Summary: ACUTE/GOLDING/TO SEE WOODYEAR/CH   Vital Signs:  Patient profile:   25 year old female Height:      64.75 inches Weight:      159.0 pounds BMI:     26.76 Temp:     99.1 degrees F oral Pulse rate:   76 / minute BP sitting:   118 / 76  (right arm)  Vitals Entered By: Filomena Jungling NT II (August 28, 2010 11:09 AM) CC: needs urine for Depo shot Is Patient Diabetic? No Pain Assessment Patient in pain? no      Nutritional Status BMI of 25 - 29 = overweight  Does patient need assistance? Functional Status Self care Ambulation Normal Comments MOM ASSISTS   CC:  needs urine for Depo shot.  History of Present Illness: 25 year old patient of Dr. Lamar Blinks who has decided that she wants to use depoprovera for birth control because seasonique BCP's are not covered by their insurance.   Her last menses ended yesterday. She is here with her mother. I spoke with both of them about depoprovera and importance of getting the injection every 12 weeks. I also talked about the fact that bleeding can be variable or she can become amenorrheic.   She and her mother voiced understanding and are without complaints.  Preventive Screening-Counseling & Management  Alcohol-Tobacco     Alcohol drinks/day: 0     Smoking Status: never  Current Medications (verified): 1)  Ritalin La 40 Mg Xr24h-Cap (Methylphenidate Hcl) .... Take 1 Tablet By Mouth Once A Day 2)  Risperdal 1 Mg Tabs (Risperidone) .... Take 1 Tablet By Mouth Once A Day 3)  Trazodone Hcl 50 Mg Tabs (Trazodone Hcl) .... Take 1 Tab By Mouth At Bedtime 4)  Lamictal 25 Mg Tabs (Lamotrigine) 5)  Naproxen 500 Mg Tabs (Naproxen) .... Take 1 Tablet By Mouth Two Times A Day As Needed For Pain  Allergies (verified): No Known Drug Allergies  Physical Exam  General:  alert and well-developed.  nervous about getting an injection.not further examined.   Impression & Recommendations:  Problem # 1:  CONTRACEPTIVE MANAGEMENT  (ICD-V25.09)  Urine pregnancy test done today which was negative. Depoprovera injection given today. They will schedule for her to return in 12 weeks. As I discussed in HPI use of depoprovera was explained and questions answered.  Orders: Depo-Provera 150mg  (J1055) Admin of Therapeutic Inj  intramuscular or subcutaneous (01027)  Complete Medication List: 1)  Ritalin La 40 Mg Xr24h-cap (Methylphenidate hcl) .... Take 1 tablet by mouth once a day 2)  Risperdal 1 Mg Tabs (Risperidone) .... Take 1 tablet by mouth once a day 3)  Trazodone Hcl 50 Mg Tabs (Trazodone hcl) .... Take 1 tab by mouth at bedtime 4)  Lamictal 25 Mg Tabs (Lamotrigine) 5)  Naproxen 500 Mg Tabs (Naproxen) .... Take 1 tablet by mouth two times a day as needed for pain  Other Orders: T-Urine Pregnancy (in -house) [phone])  Prevention & Chronic Care Immunizations   Influenza vaccine: Not documented    Tetanus booster: Not documented    Pneumococcal vaccine: Not documented  Other Screening   Pap smear: Not documented   Smoking status: never  (08/28/2010)   Process Orders Check Orders Results:     [facility]: Check successful     Laboratory Results   Urine Tests  Date/Time Received: August 28, 2010 11:30 AM Date/Time Reported: Alric Quan  August 28, 2010 11:30 AM     Urine HCG: negative  Medication Administration  Injection # 1:    Medication: Depo-Provera 150mg     Diagnosis: CONTRACEPTIVE MANAGEMENT (ICD-V25.09)    Route: IM    Site: L deltoid    Exp Date: 01/2013    Lot #: [id number]    Mfr: Illene Silver    Patient tolerated injection without complications    Given by: Angelina Ok RN (August 28, 2010 11:53 AM)  Orders Added: 1)  T-Urine Pregnancy (in -house) [81025] 2)  Est. Patient Level II [86578] 3)  Depo-Provera 150mg  [J1055] 4)  Admin of Therapeutic Inj  intramuscular or subcutaneous [46962]

## 2011-01-14 NOTE — Progress Notes (Signed)
Summary: dermatologist appt/ hla  Phone Note Call from Patient   Summary of Call: mother calls and states she needs to speak to gladys concerning the derm. appt. that pt had, please call her at [phone] or [phone] Initial call taken by: Marin Roberts RN,  October 15, 2010 12:01 PM  Follow-up for Phone Call        RTC to pt's mom.  Visits available were faxed to Dermatologist office.   Follow-up by: Angelina Ok RN,  October 15, 2010 5:01 PM

## 2011-01-14 NOTE — Progress Notes (Signed)
Summary: med change/gp  Phone Note Refill Request Message from:  Fax from Pharmacy on July 03, 2010 10:35 AM  Refills Requested: Medication #1:  SEASONIQUE 0.15-0.03 &0.01 MG TABS Take 1 tablet by mouth once a day as directed. This med. is not covered by pt.'s insurance; also none of the ?continuing packs aren't either per pharmacy. Please change.  Thanks   Method Requested: Electronic Initial call taken by: Chinita Pester RN,  July 03, 2010 10:35 AM  Follow-up for Phone Call        Our formulary checker showed that they would be covered. I will write her for ortho tricyclen and she can just skip the blanks(goal is to not have a period). The other option is that she can make an appointment at the [facility] in [facility]-i think the pills are given to her for free there-including the continuing packs-she can also consider depo-provera shots which after a few months would likely stop her period. Would you find out which option she would liek to do and then let me know. If she has lots of questions I am willing to call her but may not be able to call until later in the week. Also when you speak to her or her mom let them know all of her labs were normal. Follow-up by: Julaine Fusi  DO,  July 08, 2010 1:23 PM  Additional Follow-up for Phone Call Additional follow up Details #1::        Call to pt and her mom.  Pt has been to the [facility] before.  Because she has insurance she will have to still use the Medicaid.  Pt's mom was told about the Tri-Cyclen option of taking the pack except for the last week and staring on a new pack monthly or the Depo Provera injections every 3 months.  Pt's mon said that she will discuss the options with the pt and call the Clinics back to let us know.  Pt's mom was also informed that the labs were normal.  Mom voiced understanding of the plan/options. Angelina Ok RN  July 09, 2010 9:45 AM  Additional Follow-up by: Angelina Ok RN,  July 09, 2010  9:45 AM

## 2011-01-14 NOTE — Assessment & Plan Note (Signed)
Summary: EST-3 MONTH RECHECK/CH   Vital Signs:  Patient profile:   25 year old female Height:      64.75 inches (164.47 cm) Weight:      169.04 pounds (76.84 kg) BMI:     28.45 Temp:     98.7 degrees F (37.06 degrees C) oral Pulse rate:   100 / minute BP sitting:   106 / 68  (right arm) Cuff size:   large  Vitals Entered By: Angelina Ok RN (October 02, 2010 2:06 PM) CC: Depression Is Patient Diabetic? No Pain Assessment Patient in pain? yes     Location: throat Intensity: 8 Type: sore Onset of pain  With activity Nutritional Status BMI of 19 -24 = normal  Have you ever been in a relationship where you felt threatened, hurt or afraid?No   Does patient need assistance? Functional Status Self care Ambulation Normal Comments Check up.  Needs vaginal check.  Sore throat.  Burns when she coughs.  Lots of bumps in vaginal area.  Getting more.   CC:  Depression.  History of Present Illness: 1. Vaginal bumps. Pain and itching. -Symptoms for several months, bumps continue to grow and spread. -Sexually active, using condoms -No vaginal discharge -Needs PAP today  2. Cough, Congestions Dehydration new. Dry cough.  Otherwise doing well.     Depression History:      The patient denies a depressed mood most of the day and a diminished interest in her usual daily activities.         Preventive Screening-Counseling & Management  Alcohol-Tobacco     Alcohol drinks/day: 0     Smoking Status: never  Allergies: No Known Drug Allergies  Physical Exam  General:  Well-developed,well-nourished,in no acute distress; alert,appropriate and cooperative throughout examination Neck:  No deformities, masses, or tenderness noted. Lungs:  Normal respiratory effort, chest expands symmetrically. Lungs are clear to auscultation, no crackles or wheezes. Heart:  Normal rate and regular rhythm. S1 and S2 normal without gallop, murmur, click, rub or other extra sounds. Abdomen:  Bowel  sounds positive,abdomen soft and non-tender without masses, organomegaly or hernias noted. Genitalia:  no vaginal discharge, mucosa pink and moist, no vaginal or cervical lesions, normal uterus size and position, no adnexal masses or tenderness, labial erythema, and conlylomata.   Msk:  No deformity or scoliosis noted of thoracic or lumbar spine.   Skin:  color normal, no ecchymoses, and no ulcerations.  Multiple extensor surface warts. Psych:  Oriented X3, memory intact for recent and remote, and dysphoric affect.     Impression & Recommendations:  Problem # 1:  CONTRACEPTIVE MANAGEMENT (ICD-V25.09) Plan is for depo-provera q 3 months- I advised her to consider going to the health department for family planning issues. discussed safe sex practice and use of condoms and disease prevention importance.  Orders: T-Pap Smear, Thin Prep (03474) T-Chlamydia & GC Probe, Genital ([id number]) T-Wet Prep by Molecular Probe [phone])  Problem # 2:  WARTS, MULTIPLE (ICD-078.10) Referral to dermatology. Multiple warts- will need specialized care.  Problem # 3:  COUGH (ICD-786.2) Likely from post-nasal drip. Will try steroid nasal spray- if not improved will consider an oral anti-histamine- caution with her psych meds. advised to avoid OTC meds with DM or Psuedoephdrine.  Complete Medication List: 1)  Ritalin La 40 Mg Xr24h-cap (Methylphenidate hcl) .... Take 1 tablet by mouth once a day 2)  Risperdal 1 Mg Tabs (Risperidone) .... Take 1 tablet by mouth once a day 3)  Trazodone Hcl 50  Mg Tabs (Trazodone hcl) .... Take 1 tab by mouth at bedtime 4)  Lamictal 25 Mg Tabs (Lamotrigine) 5)  Naproxen 500 Mg Tabs (Naproxen) .... Take 1 tablet by mouth two times a day as needed for pain 6)  Flonase 50 Mcg/act Susp (Fluticasone propionate) .... One spray in each nostril twice daily as directed 7)  Depo-provera 150 Mg/ml Susp (Medroxyprogesterone acetate) .... Q3 months for contraception  Other  Orders: Influenza Vaccine MCR (16109)  Patient Instructions: 1)  Please schedule a follow-up appointment in 6 months. Prescriptions: FLONASE 50 MCG/ACT SUSP (FLUTICASONE PROPIONATE) One spray in each nostril twice daily as directed  #1 bottle x 3   Entered by:   Angelina Ok RN   Authorized by:   Julaine Fusi  DO   Signed by:   Julaine Fusi  DO on 11/05/2010   Method used:   Electronically to        [facility]  [address] [id number] ([facility])       [address]       [place], [place]  [address]       Ph: [phone] or [phone]       Fax: [phone]   RxID:   [id number]    Orders Added: 1)  Est. Patient Level IV [03474] 2)  T-Pap Smear, Thin Prep [25956] 3)  T-Chlamydia & GC Probe, Genital [87491/[id number]] 4)  T-Wet Prep by Molecular Probe [[id number]] 5)  Influenza Vaccine MCR [00025]   Immunizations Administered:  Influenza Vaccine # 1:    Vaccine Type: Fluvax MCR    Site: left deltoid    Mfr: GlaxoSmithKline    Dose: 0.5 ml    Route: IM    Given by: Dimas Aguas, SN, ?G.Herbin,RN    Exp. Date: 06/14/2011    Lot #: [id number]    VIS given: 07/09/10 version given October 02, 2010.  Flu Vaccine Consent Questions:    Do you have a history of severe allergic reactions to this vaccine? no    Any prior history of allergic reactions to egg and/or gelatin? no    Do you have a sensitivity to the preservative Thimersol? no    Do you have a past history of Guillan-Barre Syndrome? no    Do you currently have an acute febrile illness? no    Have you ever had a severe reaction to latex? no    Vaccine information given and explained to patient? yes    Are you currently pregnant? no   Immunizations Administered:  Influenza Vaccine # 1:    Vaccine Type: Fluvax MCR    Site: left deltoid    Mfr: GlaxoSmithKline    Dose: 0.5 ml    Route: IM    Given by: Dimas Aguas, SN, ?G.Herbin,RN    Exp. Date: 06/14/2011    Lot #: [id number]    VIS given:  07/09/10 version given October 02, 2010.  Prevention & Chronic Care Immunizations   Influenza vaccine: Fluvax MCR  (10/02/2010)    Tetanus booster: Not documented    Pneumococcal vaccine: Not documented  Other Screening   Pap smear: Not documented   Smoking status: never  (10/02/2010)  Process Orders Check Orders Results:     [facility]: Order checked:     5990 -- T-Chlamydia & GC Probe, Genital -- ABN required due to diagnosis (CPT: 30160,10932)     35573 -- T-Wet Prep by Molecular Probe -- ABN required due to diagnosis (CPT: [phone])  Tests Sent for requisitioning (November 05, 2010 12:07 AM):     10/02/2010: [facility] -- T-Pap Smear, Thin Prep [60454] (signed)     10/02/2010: [facility] -- T-Chlamydia & GC Probe, Genital [87491/[id number]] (signed)     10/02/2010: [facility] -- T-Wet Prep by Molecular Probe [phone] (signed)

## 2011-01-14 NOTE — Progress Notes (Signed)
Summary: phone/gg  Phone Note Call from Patient   Caller: Mom Summary of Call: Pt mother called and states pt has runny nose,  sore throat and non prod cough.  SHe is taking  fluids,  denies fever.   onset  2 - 3 days ago.  pt seen in clinic and had labs on  2/3.   Mother was told not to give otc meds without calling us first.  Please advise. # [phone] Initial call taken by: Merrie Roof RN,  January 23, 2010 2:17 PM  Follow-up for Phone Call        Her BP is well controlled, so she can take some OTC cold meds. MOst she needsrest and fluids. As she was just in the hospital, if this persists, she should come in to be seen Follow-up by: Zoila Shutter MD,  January 23, 2010 2:40 PM  Additional Follow-up for Phone Call Additional follow up Details #1::        Mother informed Additional Follow-up by: Merrie Roof RN,  January 23, 2010 5:10 PM

## 2011-01-14 NOTE — Progress Notes (Signed)
Summary: Abn labs//kg  Phone Note Outgoing Call   Call placed by: Krystal Eaton Duncan Dull),  January 11, 2010 11:54 AM Call placed to: Patient Summary of Call: Pt in the bed, discussed with mom that pt should not take any more Depakote untill she hears from either our office or Dr Sheela Stack office.  Pt has not taken her morning dose yet and mom verbalizes understanding of not to give her any until she hears from someone.  Contacted Dr Robert Bellow office to inform them of the results, he had already left the office.  He was paged at [phone].  He called back and suggested pt return to our office for repeat labs as this may be a lab error. He also wanted to know what time pt had taken the medicine in relation to what time the labs were drawn, which I could not answer at the time, but after calling pt's mom back, pt had taken her morning dose 1000mg  around 7am and labs were obtained.  Pt's mom stated that when her son gets home with the car around 2pm she will bring dtr in for repeat labs.  Follow-up for Phone Call        Pt returned for repeat labs and decision made by the attending to have pt admitted to [facility] for observation Follow-up by: Krystal Eaton Duncan Dull),  January 11, 2010 5:13 PM  New Problems: ENCOUNTER FOR LONG-TERM USE OF OTHER MEDICATIONS (ICD-V58.69)   New Problems: ENCOUNTER FOR LONG-TERM USE OF OTHER MEDICATIONS (ICD-V58.69)  Process Orders Check Orders Results:     [facility]: Check successful Tests Sent for requisitioning (January 11, 2010 5:11 PM):     01/11/2010: [facility] -- T-Valproic Acid (Depakene) [phone] (signed)     01/11/2010: [facility] -- T-CBC w/Diff [[id number]] (signed)    Vital Signs:  Patient profile:   25 year old female Weight:      135.9 pounds (61.77 kg) Temp:     97.6 degrees F (36.44 degrees C) Pulse rate:   97 / minute BP sitting:   112 / 70  (right arm)  Vitals Entered By: Krystal Eaton Duncan Dull) (January 11, 2010 5:12 PM)

## 2011-01-14 NOTE — Assessment & Plan Note (Signed)
Summary: depo shot/cfb  Nurse Visit   Allergies: No Known Drug Allergies  Medication Administration  Injection # 1:    Medication: Depo-Provera 150mg     Diagnosis: CONTRACEPTIVE MANAGEMENT (ICD-V25.09)    Route: IM    Site: L deltoid    Exp Date: 01/2013    Lot #: [id number]    Mfr: Illene Silver    Patient tolerated injection without complications    Given by: Angelina Ok RN (November 20, 2010 10:20 AM)  Orders Added: 1)  Depo-Provera 150mg  [J1055] 2)  Admin of Therapeutic Inj  intramuscular or subcutaneous [96372]   Medication Administration  Injection # 1:    Medication: Depo-Provera 150mg     Diagnosis: CONTRACEPTIVE MANAGEMENT (ICD-V25.09)    Route: IM    Site: L deltoid    Exp Date: 01/2013    Lot #: [id number]    Mfr: Illene Silver    Patient tolerated injection without complications    Given by: Angelina Ok RN (November 20, 2010 10:20 AM)  Orders Added: 1)  Depo-Provera 150mg  [J1055] 2)  Admin of Therapeutic Inj  intramuscular or subcutaneous [81191]
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
## 2011-01-14 NOTE — Progress Notes (Signed)
Summary: hfu/ hla  ---- Converted from flag ---- ---- 01/15/2010 9:07 AM, Marin Roberts RN wrote: i found out this am pt is still inpt, yesterday no one answered at the ph#, this am someone answered and informed me of that, i paged dr mills and ask her if pt was indeed still inpt, she said yes and pt would need an appt today, i will now call dr Phillips Odor and see if and how we will do this.   Ermin Parisien rnfne ---- 01/15/2010 8:56 AM, Marin Roberts RN wrote:   ---- 01/12/2010 10:02 AM, Nelda Bucks DO wrote: Pt will need f/u appt on [facility] 01/14/10, preferrably with Dr. Phillips Odor.  Please call the pts mother as early as possible to give her the time of the appt.  It is critical that the pt be seen on Monday.  Thanks! ------------------------------  Appended Document: hfu/ hla The patient has an appointment with Dr. Gwenlyn Perking on thursday 01/18/10; she will need to speak with Dr. Phillips Odor at that time.   This has been discussed with Dr. Phillips Odor and she is aware the patient will be in this week.  Please refer to the appended hospital D/C document for further details.

## 2011-01-14 NOTE — Progress Notes (Signed)
Summary: ED, [facility] pysch/ hla  Phone Note Call from Patient   Caller: Mom Summary of Call: pt's mother calls to say pt is going through a very bad time and has threatened to take a large amt of pills and "end it all", wants to bring pt in today, i explained that the ED at [facility] would be the very best place, she asks that i page dr Phillips Odor and ask her, i did so and dr Phillips Odor instructs yes that ED was best or psychiatrist but that if pt insists to give her a clinic appt, i relayed this to pt's mother and she says " so dr Phillips Odor will be waiting for [place]" when told no, that pt will see another clinic md she and pt state she will take pt to [facility] ED for eval. pt  was in background yelling and cursing, at this time i cautioned pt's mother that at anytime she could call 911, she stated she understood but that she didn't want to if it could be helped, states they are leaving now for ED Initial call taken by: Marin Roberts RN,  July 23, 2010 2:07 PM  Follow-up for Phone Call        pt did go to [facility] Follow-up by: Marin Roberts RN,  July 26, 2010 3:22 PM

## 2011-01-14 NOTE — Miscellaneous (Signed)
Summary: hospital D/C  Date of admission: 01/11/10 Date of discharge: 01/09/10  Brief summary: Pt admitted for depakote toxicity with the following signs: tremor, unsteady gait, anorexia, thrombocytopenia and leukopenia.  Depakote level on DC is 133.6.  The blood abnormalities may be 2/2 depakote (low plt, low wbc) +/- ritalin (low wbc) or may reflect more insidious process (leukemia, etc.)  She will need to remain off depakote will close follow up of CBC to ensure abnormalities resolve or, if not, further w/u is pursued.   In the meantime, she will need to begin a new medication for tx of her bipolar d/o and will need follow up with psych (mother and pt are very unhappy with current psychiatrist and refuse to see him further). Please refer to outline below for additional info/plan.  Will also instruct pt to stop taking prilosec for now; it may contribute to low plts.  This can be addressed at her f/u appt.   At the follow up appt the pt will need: - STAT depakote level - CBC, BMET - f/u on peripheral smear obtained in the hospital - referral for new psychiatrist (? Dr. Donell Beers); appt within the week! - discussion with current psychiatrist, previous psychiatrist (or new psychiatrist who will continue to follow pt) regarding new medication in place of Depakote.  The pt will need to be started on a new med on the day of her f/u appt.  This is critical to prevent potential psych decompensation.  By the time of her appt, the depakote will be out of her system. - discussion with psychiatrist re: potential of Ritalin to cause/contribute to leukopenia and possibility of need to taper off med.   Clinical Lists Changes  Medications: Removed medication of DEPAKOTE 500 MG TBEC (DIVALPROEX SODIUM) 2 tabs in am and 3 tabs at night Removed medication of OMEPRAZOLE 20 MG TBEC (OMEPRAZOLE) Take 1 tablet by mouth once a day Observations: Added new observation of INSTRUCTIONS: You will have an appointment  scheduled on Monday 01/14/10.  The clinic will call you as soon as possible with the appointment information. STOP taking depakote. STOP taking prilosec until your appt on Monday 01/14/10.  This medicine can sometimes contribute to low platlets in the blood. CONTINUE all of your other medications as directed. Please call the clinic at [phone] with any concerns or questions.  If a problem arises over the weekend, please call the hospital operator at [phone] and ask to speak to the resident on call for the [facility] Out[facility]. (01/12/2010 10:03)      Patient Instructions: 1)  You will have an appointment scheduled on Monday 01/14/10.  The clinic will call you as soon as possible with the appointment information. 2)  STOP taking depakote. 3)  STOP taking prilosec until your appt on Monday 01/14/10.  This medicine can sometimes contribute to low platlets in the blood. 4)  CONTINUE all of your other medications as directed. 5)  Please call the clinic at [phone] with any concerns or questions.  If a problem arises over the weekend, please call the hospital operator at [phone] and ask to speak to the resident on call for the [facility] Outpatient Clinic.  Appended Document: hospital D/C Pt remainined in the hospital until 01/15/10 2/2 significant thrombocytopenia.  She received 1 PLT transfusion with PLTs remaining stable at appro 50K.  She did not remain to speak with Dr. Jeanie Sewer about starting a medication in place of Depakote.  In my conversation with Dr. Jeanie Sewer, he  suggested she may not need an additional med as she is on Abilify, Ritalin, and trazodone.  Pt was instructed to continue these medications.   At her follow up appointment thie Thursday, she will need a CBC checked.  Additionally, Dr. Phillips Odor should see the patient and speak with her mother about out-pt psych referral and continued medication regimen.   Clinical Lists Changes

## 2011-01-14 NOTE — Assessment & Plan Note (Signed)
Summary: HFU-PER DR MILLS/RESIDENT TO SEE WITH DR. GOLDING/CFB   Vital Signs:  Patient profile:   25 year old female Height:      64.75 inches (164.47 cm) Weight:      132.4 pounds (60.18 kg) BMI:     22.28 Temp:     97.0 degrees F (36.11 degrees C) oral Pulse rate:   114 / minute BP sitting:   116 / 76  (left arm)  Vitals Entered By: Stanton Kidney Ditzler RN (January 17, 2010 2:55 PM) Is Patient Diabetic? No Pain Assessment Patient in pain? no      Nutritional Status BMI of 19 -24 = normal Nutritional Status Detail appetite good  Have you ever been in a relationship where you felt threatened, hurt or afraid?denies   Does patient need assistance? Functional Status Self care Ambulation Normal Comments Mother and father with pt. HFU - feeling better. Discuss heartrate.   History of Present Illness: 25 y/o female with pmh as outlined in the EMR; whocomes to the clinic for HFU after been admitted secondary to depakote intoxication and complicated with thrombocytopenia and leukopenia.  Patient is doing much better today and is requesting changes on her long term psychiatry followup and medication. She is currently using abilify, ritalin and trazodone for her bipolar disorder.  Patient haert rate is mildly elevated today, but patient is not feeling dizzy or lightheaded, she denies CP, SOB, and also abdominal pain or severe reflux.    Depression History:      The patient denies a depressed mood most of the day and a diminished interest in her usual daily activities.        The patient denies that she feels like life is not worth living, denies that she wishes that she were dead, and denies that she has thought about ending her life.         Preventive Screening-Counseling & Management  Alcohol-Tobacco     Alcohol drinks/day: 0     Smoking Status: never  Problems Prior to Update: 1)  Encounter For Long-term Use of Other Medications  (ICD-V58.69) 2)  Gait Imbalance   (ICD-781.2) 3)  Bipolar Disorder Unspecified  (ICD-296.80) 4)  Loss of Weight  (ICD-783.21) 5)  Tachycardia  (ICD-785.0)  Current Problems (verified): 1)  Encounter For Long-term Use of Other Medications  (ICD-V58.69) 2)  Gait Imbalance  (ICD-781.2) 3)  Bipolar Disorder Unspecified  (ICD-296.80) 4)  Loss of Weight  (ICD-783.21) 5)  Tachycardia  (ICD-785.0)  Medications Prior to Update: 1)  Ritalin La 40 Mg Xr24h-Cap (Methylphenidate Hcl) .... Take 1 Tablet By Mouth Once A Day 2)  Abilify 20 Mg Tabs (Aripiprazole) .... 2 Tabs Daily By Mouth Daily 3)  Trazodone Hcl 50 Mg Tabs (Trazodone Hcl) .... Take 1 Tab By Mouth At Bedtime  Current Medications (verified): 1)  Ritalin La 40 Mg Xr24h-Cap (Methylphenidate Hcl) .... Take 1 Tablet By Mouth Once A Day 2)  Abilify 20 Mg Tabs (Aripiprazole) .... 2 Tabs Daily By Mouth Daily 3)  Trazodone Hcl 50 Mg Tabs (Trazodone Hcl) .... Take 1 Tab By Mouth At Bedtime  Allergies (verified): No Known Drug Allergies  Past History:  Past Medical History: Last updated: 01/10/2010 Psychiatric History: -suicide attempt, OD on ASA -self mutilation/cutting during adolesence -Bipolar Disease Dental Problems Childhood Asthma GERD- endoscopy 2000 Sees Counselor regularly: Juliane Lack  Family History: Last updated: 01/10/2010 She has a biologic father who has a history of bipolar disorder and alcohol abuse. Maternal Grandmother died of  Chronic Obstructive Lung Disease and Breast Cancer.  Risk Factors: Alcohol Use: 0 (01/17/2010)  Risk Factors: Smoking Status: never (01/17/2010)  Review of Systems  The patient denies anorexia, fever, chest pain, syncope, dyspnea on exertion, headaches, abdominal pain, melena, hematochezia, severe indigestion/heartburn, and hematuria.    Physical Exam  General:  alert, well-developed, well-nourished, and well-hydrated.  Patient is smiling, happy, energetic and cooperative with examination. Lungs:  Normal  respiratory effort, chest expands symmetrically. Lungs are clear to auscultation, no crackles or wheezes. Heart:  Tachycardia, regular rhythm, no murmurs, no gallops. Abdomen:  Bowel sounds positive,abdomen soft and non-tender without masses, organomegaly or hernias noted. Neurologic:  alert & oriented X3, cranial nerves II-XII intact, strength normal in all extremities, sensation intact to light touch, and gait normal.     Impression & Recommendations:  Problem # 1:  BIPOLAR DISORDER UNSPECIFIED (ICD-296.80) Patient is doin good and stable at this point. Willcontinue abilify, ritalin and trazadone. Will arrange followup with Dr.Polvsky or new psychiatrist and will contact patient with apponment details. Will check CBC and depakote level today (to make sure is out of her system).  Problem # 2:  GERD (ICD-530.81) Use rolaids or tums for now in order to controlled your reflux symptoms. As soon as her platelets are back to normal, if she is still having GERD problems will start a her on a PPI for long term control. Lifestyle modification changes discussed and information given.  Problem # 3:  THROMBOCYTOPENIA (ICD-287.5) Patient thrombocytopenia most likely 2/2 to depakote intoxication. Will repeat CBC today in order to evaluate her platelets levels. Depakote level will be also check today, expecting to be out of her system or really today (all of them has been seen in hemolytic process, like increased destruction 2/2 depakote toxicity); but in order to be thourough and the fact that her MCV is also elevated will check B12 levels, folic acid levels and will repeat TSH (conditions that also share those smear findings).  Orders: T-CBC w/Diff [phone]) T-Vitamin B12 [phone]) T-Folic Acid; RBC ([id number]) T-TSH ([id number]) T-T4, Free ([id number])  Complete Medication List: 1)  Ritalin La 40 Mg Xr24h-cap (Methylphenidate hcl) .... Take 1 tablet by mouth once a day 2)  Abilify 20 Mg  Tabs (Aripiprazole) .... 2 tabs daily by mouth daily 3)  Trazodone Hcl 50 Mg Tabs (Trazodone hcl) .... Take 1 tab by mouth at bedtime  Other Orders: T-Valproic Acid (Depakene) ([id number])  Patient Instructions: 1)  Take your medications as prescribed. 2)  Please schedule a follow-up appointment in 1 month with PCP. 3)  We will call you with your psychiatriy appointment details as soon as we have it. 4)  Use tums or rolaids as directed for reflux. 5)  You will be called with any abnormalities in the tests performed today and the actions that need to be taken upon rsults. 6)  Avoid foods high in acid (tomatoes, citrus juices, spicy foods). Avoid eating within two hours of lying down or before exercising. Do not over eat; try smaller more frequent meals. Elevate head of bed twelve inches when sleeping. Process Orders Check Orders Results:     [facility]: Check successful Tests Sent for requisitioning (January 17, 2010 4:18 PM):     01/17/2010: [facility] -- T-CBC w/Diff [[id number]] (signed)     01/17/2010: [facility] -- T-Valproic Acid (Depakene) [phone] (signed)     01/17/2010: [facility] -- T-Vitamin B12 [[id number]] (signed)     01/17/2010: [facility] -- T-Folic Acid; RBC [[id number]] (signed)     01/17/2010: [facility] -- T-TSH [phone] (signed)     01/17/2010: [facility] -- T-T4, [name] [[id number]] (signed)    Prevention & Chronic Care Immunizations   Influenza vaccine: Not documented    Tetanus booster: Not documented    Pneumococcal vaccine: Not documented  Other Screening   Pap smear: Not documented   Smoking status: never  (01/17/2010)

## 2011-01-14 NOTE — Progress Notes (Signed)
  I was paged by [facility] with critically high level of valproic acid at 236.8 (normal 50-100) and critically low platelets at 41,000. Will notify Dr. Phillips Odor.

## 2011-01-14 NOTE — Miscellaneous (Signed)
Summary: Hospital Admission  INTERNAL MEDICINE ADMISSION HISTORY AND PHYSICAL  Attending: Dr. Ninetta Lights 1st contact: Dr. Arvilla Market [phone] 2nd contact: Dr. Sunnie Nielsen [phone]  After 5pm weekdays, holidays, and weekends: 1st contact: [phone] 2nd contact: [phone]  PCP: Dr. Phillips Odor.   XB:JYNW problems, tremors, bruises.  HPI: [name] is a 25 y/o F with PMH outlined below who presents as an admission from clinic for depakote toxicity.  The HPI is limited 2/2 pts mental capacity; mother is not present for exam and was unable to be reached by cell phone.  The patient reports tremors, "feeling jittery," unsteady gait and decreased appetite for a few weeks.  She reports her girlfriend noticed she was shaking in her sleep and was concerned she was having a seizure.  She denies loss of bowel/bladder incontinence, history of tongue biting, loss of consciousness, dizziness, visual changes but admits to falling at home a few days PTA 2/2 feeling unsteady.  She admits to decreased appetite for weeks to months with significant weight loss.  She denies fever, chills, stomach pain, chest pain, SOB, headache, diarrhea, nausea, vomiting, and bowel/bladder problems.  She endorses lower abdominal pain related to her menses.   No other complaints.  She presented to [facility] on 01/10/10 for evaluation of weight loss.  Depakote level was checked with routine labs and returned significantly elevated at 236.8.  She returned to clinic on 01/11/10 for follow up lab check of depakote level; results still elevated at 197 and pt symptomatic with unsteady gait and tremors.  At this time, it is unclear if she has been taking her depakote inappropriately or not following up with physician for routine check of depakote levels.  ALLERGIES: NKDA  PAST MEDICAL HISTORY: Gait imbalance Bipolar disorder. Suicidal attempt. History of self- inflicted injuries Depression with history of  suicidal ideation. 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   MEDICATIONS:  DEPAKOTE 500 MG TBEC (DIVALPROEX SODIUM) 2 tabs in am and 3 tabs at night RITALIN LA 40 MG XR24H-CAP (METHYLPHENIDATE HCL) Take 1 tablet by mouth once a day ABILIFY 20 MG TABS (ARIPIPRAZOLE) 2 tabs daily by mouth daily TRAZODONE HCL 50 MG TABS (TRAZODONE HCL) Take 1 tab by mouth at bedtime OMEPRAZOLE 20 MG TBEC (OMEPRAZOLE) Take 1 tablet by mouth once a day   SOCIAL HISTORY:  No alcohol or drug use on the part of the   patient.  FAMILY HISTORY Father had a history of alcohol abuse.  GNF:[id number] except as HPI.  VITALS:  T:97.8  P: 108 BP:146/86  R: 20 O2SAT: 94  ON:RA  PHYSICAL EXAM: General:  alert, well-developed, NAD, cooperative Head:  normocephalic and atraumatic.   Eyes:  + bilateral miosis, PERRLA, EOMI, vision grossly intact, conjuctive and sclerae within normal limits.   Mouth:  MMM, no erythema, no exudates, or lesions.   Neck:  supple, full ROM, trachea midline, no palp masses, no JVD, no carotid bruits.   Lungs:  CTAB, normal respiratory effort  Heart: RRR, no M/R/G Msk:  no joint swelling, warmth, or erythema  Ext: multiple eccymoses present on bilateral lower extremities.  No clubbing, cyanosis, or edema. Neurologic:  CN II-XII intact,+5 strength globally, sensation grossly intact, cerebellar function normal, gait unsteady and imbalanced with wide base.  Small tremors present.  No asterixis. Skin:  turgor normal and no rashes.   Psych: some difficulty understanding/interpreting questions, answers appropriately, normally interactive, good eye contact, affect as expected   LABS:  Platelet  Count (PLT)           120    (Sep. 2009)   Valproic Acid (Depakene)                 89.0  (Sep 2009)       WBC                  [L]  2.2 K/uL                    (4.0-10.5)   RBC                  [L]  3.64 MIL/uL                 (3.87-5.11   Hemoglobin                 13.6 g/dL                   (42.7-06.2   Hematocrit                39.3 %                      (36.0-46.0   MCV                  [H]  108.0 fL                    (78.0-100.   MCHC                      34.7 g/dL                   (37.6-28.3   RDW                  [H]  17.5 %                      (11.5-15.5   Platelet Count       [LL] 37 K/uL                     (150-400)     Platelet count confirmed on smear   Granulocyte %             46 %                        (43-77)   Absolute Gran        [L]  1.0 K/uL                    (1.7-7.7)   Lymph %                   45 %                        (12-46)   Absolute Lymph            1.0 K/uL                    (0.7-4.0)   Mono %                    8 %                         (  3-12)   Absolute Mono             0.2 K/uL                    (0.1-1.0)   Eos %                     0 %                         (0-5)   Absolute Eos              0.0 K/uL                    (0.0-0.7)   Baso %                    0 %                         (0-1)   Absolute Baso             0.0 K/uL                    (0.0-0.1)   WBC Morphology            SEE NOTE     WHITE COUNT CONFIRMED BY SMEAR   RBC Morphology       RESULT: Criteria for review not met   Smear Review       RESULT: Criteria for review not met  Tests: (2) Valproic Acid (Depakene) (23520)  Valproic Acid (Depakene)                        [HH] 197.1 ug/mL    ---236              TSH                  [H]  5.539 uIU/mL                (0.350-4.5  ASSESSMENT AND PLAN: (1) Depakote toxicity Depakote toxicity explains a number of [name] symptoms: anorexia, weight loss, tremors, thrombocytopenia, gait disturbance, and maybe leukopenia. - WIll check free T4 (TSH mildly elevated) - monitor depakote level - hold depakote - Check BMET to eval for acidosis/electrolye abnormalities 2/2 toxicity  (2) Leukopenia - may be 2/2 #1 - will check B12   (3)ADHD Continue home medications  (4)VTE  PROPH:scds

## 2011-01-20 ENCOUNTER — Other Ambulatory Visit: Payer: Self-pay | Admitting: Surgery

## 2011-01-29 ENCOUNTER — Encounter (HOSPITAL_COMMUNITY): Payer: Self-pay | Admitting: Psychiatry

## 2011-02-05 ENCOUNTER — Encounter (HOSPITAL_COMMUNITY): Payer: Medicaid Other | Admitting: Psychiatry

## 2011-02-05 DIAGNOSIS — F39 Unspecified mood [affective] disorder: Secondary | ICD-10-CM

## 2011-02-25 LAB — URINALYSIS, ROUTINE W REFLEX MICROSCOPIC
Bilirubin Urine: NEGATIVE
Glucose, UA: NEGATIVE mg/dL
Nitrite: NEGATIVE
Protein, ur: NEGATIVE mg/dL
Specific Gravity, Urine: 1.025 (ref 1.005–1.030)
Urobilinogen, UA: 0.2 mg/dL (ref 0.0–1.0)
pH: 6.5 (ref 5.0–8.0)

## 2011-02-25 LAB — COMPREHENSIVE METABOLIC PANEL
ALT: 12 U/L (ref 0–35)
AST: 16 U/L (ref 0–37)
Albumin: 3.7 g/dL (ref 3.5–5.2)
Alkaline Phosphatase: 67 U/L (ref 39–117)
BUN: 8 mg/dL (ref 6–23)
CO2: 28 mEq/L (ref 19–32)
Calcium: 9.1 mg/dL (ref 8.4–10.5)
Chloride: 105 mEq/L (ref 96–112)
Creatinine, Ser: 1.09 mg/dL (ref 0.4–1.2)
GFR calc Af Amer: >60 mL/min (ref >60)
GFR calc non Af Amer: >60 mL/min (ref >60)
Glucose, Bld: 100 mg/dL — ABNORMAL HIGH (ref 70–99)
Potassium: 4.3 mEq/L (ref 3.5–5.1)
Sodium: 141 mEq/L (ref 135–145)
Total Bilirubin: 0.4 mg/dL (ref 0.3–1.2)
Total Protein: 6.4 g/dL (ref 6.0–8.3)

## 2011-02-25 LAB — PREGNANCY, URINE: Preg Test, Ur: NEGATIVE

## 2011-02-25 LAB — URINE MICROSCOPIC-ADD ON

## 2011-02-28 LAB — URINE MICROSCOPIC-ADD ON

## 2011-02-28 LAB — DIFFERENTIAL
Basophils Absolute: 0 10*3/uL (ref 0.0–0.1)
Basophils Relative: 0 % (ref 0–1)
Eosinophils Absolute: 0 10*3/uL (ref 0.0–0.7)
Eosinophils Relative: 0 % (ref 0–5)
Lymphocytes Relative: 30 % (ref 12–46)
Lymphs Abs: 1.7 10*3/uL (ref 0.7–4.0)
Monocytes Absolute: 0.3 10*3/uL (ref 0.1–1.0)
Monocytes Relative: 5 % (ref 3–12)
Neutro Abs: 3.7 10*3/uL (ref 1.7–7.7)
Neutrophils Relative %: 65 % (ref 43–77)

## 2011-02-28 LAB — CBC
HCT: 38.4 % (ref 36.0–46.0)
Hemoglobin: 12.9 g/dL (ref 12.0–15.0)
MCH: 31 pg (ref 26.0–34.0)
MCHC: 33.5 g/dL (ref 30.0–36.0)
MCV: 92.5 fL (ref 78.0–100.0)
Platelets: 162 10*3/uL (ref 150–400)
RBC: 4.15 MIL/uL (ref 3.87–5.11)
RDW: 14.8 % (ref 11.5–15.5)
WBC: 5.6 10*3/uL (ref 4.0–10.5)

## 2011-02-28 LAB — DRUGS OF ABUSE SCREEN W/O ALC, ROUTINE URINE
Amphetamine Screen, Ur: NEGATIVE
Barbiturate Quant, Ur: NEGATIVE
Benzodiazepines.: NEGATIVE
Cocaine Metabolites: NEGATIVE
Creatinine,U: 188.4 mg/dL
Marijuana Metabolite: NEGATIVE
Methadone: NEGATIVE
Opiate Screen, Urine: NEGATIVE
Phencyclidine (PCP): NEGATIVE
Propoxyphene: NEGATIVE

## 2011-02-28 LAB — URINALYSIS, ROUTINE W REFLEX MICROSCOPIC
Bilirubin Urine: NEGATIVE
Glucose, UA: NEGATIVE mg/dL
Ketones, ur: NEGATIVE mg/dL
Leukocytes, UA: NEGATIVE
Nitrite: NEGATIVE
Protein, ur: NEGATIVE mg/dL
Specific Gravity, Urine: 1.03 (ref 1.005–1.030)
Urobilinogen, UA: 1 mg/dL (ref 0.0–1.0)
pH: 5.5 (ref 5.0–8.0)

## 2011-02-28 LAB — RAPID URINE DRUG SCREEN, HOSP PERFORMED
Amphetamines: NOT DETECTED
Barbiturates: NOT DETECTED
Benzodiazepines: POSITIVE — AB
Cocaine: NOT DETECTED
Opiates: NOT DETECTED
Tetrahydrocannabinol: NOT DETECTED

## 2011-02-28 LAB — BASIC METABOLIC PANEL
BUN: 10 mg/dL (ref 6–23)
CO2: 27 mEq/L (ref 19–32)
Calcium: 9.5 mg/dL (ref 8.4–10.5)
Chloride: 105 mEq/L (ref 96–112)
Creatinine, Ser: 0.9 mg/dL (ref 0.4–1.2)
GFR calc Af Amer: >60 mL/min (ref >60)
GFR calc non Af Amer: >60 mL/min (ref >60)
Glucose, Bld: 83 mg/dL (ref 70–99)
Potassium: 3.8 mEq/L (ref 3.5–5.1)
Sodium: 139 mEq/L (ref 135–145)

## 2011-02-28 LAB — TRICYCLICS SCREEN, URINE: TCA Scrn: NOT DETECTED

## 2011-02-28 LAB — PREGNANCY, URINE: Preg Test, Ur: NEGATIVE

## 2011-02-28 LAB — ETHANOL: Alcohol, Ethyl (B): <5 mg/dL (ref 0–10)

## 2011-03-02 LAB — COMPREHENSIVE METABOLIC PANEL
ALT: 13 U/L (ref 0–35)
ALT: 17 U/L (ref 0–35)
AST: 30 U/L (ref 0–37)
AST: 42 U/L — ABNORMAL HIGH (ref 0–37)
Albumin: 2.9 g/dL — ABNORMAL LOW (ref 3.5–5.2)
Albumin: 3 g/dL — ABNORMAL LOW (ref 3.5–5.2)
Alkaline Phosphatase: 52 U/L (ref 39–117)
Alkaline Phosphatase: 61 U/L (ref 39–117)
BUN: 1 mg/dL — ABNORMAL LOW (ref 6–23)
BUN: <1 mg/dL — ABNORMAL LOW (ref 6–23)
CO2: 33 mEq/L — ABNORMAL HIGH (ref 19–32)
CO2: 33 mEq/L — ABNORMAL HIGH (ref 19–32)
Calcium: 9.1 mg/dL (ref 8.4–10.5)
Calcium: 9.5 mg/dL (ref 8.4–10.5)
Chloride: 102 mEq/L (ref 96–112)
Chloride: 108 mEq/L (ref 96–112)
Creatinine, Ser: 0.79 mg/dL (ref 0.4–1.2)
Creatinine, Ser: 0.87 mg/dL (ref 0.4–1.2)
GFR calc Af Amer: >60 mL/min (ref >60)
GFR calc Af Amer: >60 mL/min (ref >60)
GFR calc non Af Amer: >60 mL/min (ref >60)
GFR calc non Af Amer: >60 mL/min (ref >60)
Glucose, Bld: 79 mg/dL (ref 70–99)
Glucose, Bld: 96 mg/dL (ref 70–99)
Potassium: 3.3 mEq/L — ABNORMAL LOW (ref 3.5–5.1)
Potassium: 4.5 mEq/L (ref 3.5–5.1)
Sodium: 141 mEq/L (ref 135–145)
Sodium: 145 mEq/L (ref 135–145)
Total Bilirubin: 0.8 mg/dL (ref 0.3–1.2)
Total Bilirubin: 1 mg/dL (ref 0.3–1.2)
Total Protein: 5.3 g/dL — ABNORMAL LOW (ref 6.0–8.3)
Total Protein: 5.4 g/dL — ABNORMAL LOW (ref 6.0–8.3)

## 2011-03-02 LAB — CBC
HCT: 30.4 % — ABNORMAL LOW (ref 36.0–46.0)
HCT: 30.9 % — ABNORMAL LOW (ref 36.0–46.0)
HCT: 34.4 % — ABNORMAL LOW (ref 36.0–46.0)
HCT: 34.5 % — ABNORMAL LOW (ref 36.0–46.0)
HCT: 37.1 % (ref 36.0–46.0)
Hemoglobin: 10.5 g/dL — ABNORMAL LOW (ref 12.0–15.0)
Hemoglobin: 10.7 g/dL — ABNORMAL LOW (ref 12.0–15.0)
Hemoglobin: 11.9 g/dL — ABNORMAL LOW (ref 12.0–15.0)
Hemoglobin: 11.9 g/dL — ABNORMAL LOW (ref 12.0–15.0)
Hemoglobin: 12.5 g/dL (ref 12.0–15.0)
MCHC: 33.7 g/dL (ref 30.0–36.0)
MCHC: 34.4 g/dL (ref 30.0–36.0)
MCHC: 34.4 g/dL (ref 30.0–36.0)
MCHC: 34.5 g/dL (ref 30.0–36.0)
MCHC: 34.5 g/dL (ref 30.0–36.0)
MCV: 106.9 fL — ABNORMAL HIGH (ref 78.0–100.0)
MCV: 107.8 fL — ABNORMAL HIGH (ref 78.0–100.0)
MCV: 107.8 fL — ABNORMAL HIGH (ref 78.0–100.0)
MCV: 108 fL — ABNORMAL HIGH (ref 78.0–100.0)
MCV: 108.7 fL — ABNORMAL HIGH (ref 78.0–100.0)
Platelets: 15 10*3/uL — CL (ref 150–400)
Platelets: 21 10*3/uL — CL (ref 150–400)
Platelets: 21 10*3/uL — CL (ref 150–400)
Platelets: 48 10*3/uL — ABNORMAL LOW (ref 150–400)
Platelets: 55 10*3/uL — ABNORMAL LOW (ref 150–400)
RBC: 2.85 MIL/uL — ABNORMAL LOW (ref 3.87–5.11)
RBC: 2.85 MIL/uL — ABNORMAL LOW (ref 3.87–5.11)
RBC: 3.19 MIL/uL — ABNORMAL LOW (ref 3.87–5.11)
RBC: 3.2 MIL/uL — ABNORMAL LOW (ref 3.87–5.11)
RBC: 3.44 MIL/uL — ABNORMAL LOW (ref 3.87–5.11)
RDW: 17.6 % — ABNORMAL HIGH (ref 11.5–15.5)
RDW: 17.8 % — ABNORMAL HIGH (ref 11.5–15.5)
RDW: 18 % — ABNORMAL HIGH (ref 11.5–15.5)
RDW: 18.2 % — ABNORMAL HIGH (ref 11.5–15.5)
RDW: 18.3 % — ABNORMAL HIGH (ref 11.5–15.5)
WBC: 2.2 10*3/uL — ABNORMAL LOW (ref 4.0–10.5)
WBC: 2.3 10*3/uL — ABNORMAL LOW (ref 4.0–10.5)
WBC: 3 10*3/uL — ABNORMAL LOW (ref 4.0–10.5)
WBC: 3.2 10*3/uL — ABNORMAL LOW (ref 4.0–10.5)
WBC: 3.2 10*3/uL — ABNORMAL LOW (ref 4.0–10.5)

## 2011-03-02 LAB — BASIC METABOLIC PANEL
BUN: 1 mg/dL — ABNORMAL LOW (ref 6–23)
CO2: 33 mEq/L — ABNORMAL HIGH (ref 19–32)
Calcium: 9.1 mg/dL (ref 8.4–10.5)
Chloride: 105 mEq/L (ref 96–112)
Creatinine, Ser: 0.84 mg/dL (ref 0.4–1.2)
GFR calc Af Amer: >60 mL/min (ref >60)
GFR calc non Af Amer: >60 mL/min (ref >60)
Glucose, Bld: 96 mg/dL (ref 70–99)
Potassium: 4.3 mEq/L (ref 3.5–5.1)
Sodium: 142 mEq/L (ref 135–145)

## 2011-03-02 LAB — VITAMIN B12: Vitamin B-12: 1154 pg/mL — ABNORMAL HIGH (ref 211–911)

## 2011-03-02 LAB — VALPROIC ACID LEVEL
Valproic Acid Lvl: 133.6 ug/mL — ABNORMAL HIGH (ref 50.0–100.0)
Valproic Acid Lvl: 135.4 ug/mL — ABNORMAL HIGH (ref 50.0–100.0)
Valproic Acid Lvl: 136.3 ug/mL — ABNORMAL HIGH (ref 50.0–100.0)
Valproic Acid Lvl: 164 ug/mL — ABNORMAL HIGH (ref 50.0–100.0)
Valproic Acid Lvl: 35.9 ug/mL — ABNORMAL LOW (ref 50.0–100.0)

## 2011-03-02 LAB — DIFFERENTIAL
Basophils Absolute: 0 10*3/uL (ref 0.0–0.1)
Basophils Relative: 0 % (ref 0–1)
Eosinophils Absolute: 0 10*3/uL (ref 0.0–0.7)
Eosinophils Relative: 0 % (ref 0–5)
Lymphocytes Relative: 48 % — ABNORMAL HIGH (ref 12–46)
Lymphs Abs: 1.5 10*3/uL (ref 0.7–4.0)
Monocytes Absolute: 0.5 10*3/uL (ref 0.1–1.0)
Monocytes Relative: 16 % — ABNORMAL HIGH (ref 3–12)
Neutro Abs: 1.1 10*3/uL — ABNORMAL LOW (ref 1.7–7.7)
Neutrophils Relative %: 36 % — ABNORMAL LOW (ref 43–77)

## 2011-03-02 LAB — GC/CHLAMYDIA PROBE AMP, GENITAL
Chlamydia, DNA Probe: NEGATIVE
GC Probe Amp, Genital: NEGATIVE

## 2011-03-02 LAB — PREPARE PLATELETS

## 2011-03-02 LAB — WET PREP, GENITAL: Trich, Wet Prep: NONE SEEN

## 2011-03-02 LAB — T4, FREE: Free T4: 0.93 ng/dL (ref 0.80–1.80)

## 2011-03-02 LAB — PROTIME-INR
INR: 1.32 (ref 0.00–1.49)
Prothrombin Time: 16.3 seconds — ABNORMAL HIGH (ref 11.6–15.2)

## 2011-03-02 LAB — APTT: aPTT: 33 seconds (ref 24–37)

## 2011-03-02 LAB — POCT URINALYSIS DIP (DEVICE)
Bilirubin Urine: NEGATIVE
Glucose, UA: NEGATIVE mg/dL
Hgb urine dipstick: NEGATIVE
Ketones, ur: NEGATIVE mg/dL
Nitrite: NEGATIVE
Protein, ur: NEGATIVE mg/dL
Specific Gravity, Urine: 1.025 (ref 1.005–1.030)
Urobilinogen, UA: 0.2 mg/dL (ref 0.0–1.0)
pH: 6 (ref 5.0–8.0)

## 2011-03-02 LAB — POCT PREGNANCY, URINE: Preg Test, Ur: NEGATIVE

## 2011-03-02 LAB — FOLATE: Folate: 3.6 ng/mL

## 2011-03-02 LAB — LACTATE DEHYDROGENASE: LDH: 189 U/L (ref 94–250)

## 2011-03-02 LAB — ABO/RH: ABO/RH(D): O POS

## 2011-03-02 LAB — HIV ANTIBODY (ROUTINE TESTING W REFLEX): HIV: NONREACTIVE

## 2011-03-02 LAB — AMMONIA: Ammonia: 42 umol/L — ABNORMAL HIGH (ref 11–35)

## 2011-03-02 LAB — TECHNOLOGIST SMEAR REVIEW

## 2011-03-02 LAB — MAGNESIUM: Magnesium: 2 mg/dL (ref 1.5–2.5)

## 2011-03-05 LAB — CBC
HCT: 30.4 % — ABNORMAL LOW (ref 36.0–46.0)
Hemoglobin: 10.5 g/dL — ABNORMAL LOW (ref 12.0–15.0)
MCHC: 34.5 g/dL (ref 30.0–36.0)
MCV: 107.2 fL — ABNORMAL HIGH (ref 78.0–100.0)
Platelets: 51 10*3/uL — ABNORMAL LOW (ref 150–400)
RBC: 2.84 MIL/uL — ABNORMAL LOW (ref 3.87–5.11)
RDW: 18.3 % — ABNORMAL HIGH (ref 11.5–15.5)
WBC: 4.1 10*3/uL (ref 4.0–10.5)

## 2011-03-05 LAB — BASIC METABOLIC PANEL
BUN: 3 mg/dL — ABNORMAL LOW (ref 6–23)
CO2: 29 mEq/L (ref 19–32)
Calcium: 9.2 mg/dL (ref 8.4–10.5)
Chloride: 108 mEq/L (ref 96–112)
Creatinine, Ser: 0.75 mg/dL (ref 0.4–1.2)
GFR calc Af Amer: >60 mL/min (ref >60)
GFR calc non Af Amer: >60 mL/min (ref >60)
Glucose, Bld: 81 mg/dL (ref 70–99)
Potassium: 4.4 mEq/L (ref 3.5–5.1)
Sodium: 142 mEq/L (ref 135–145)

## 2011-03-19 ENCOUNTER — Encounter (HOSPITAL_COMMUNITY): Payer: Medicaid Other | Admitting: Psychiatry

## 2011-03-22 ENCOUNTER — Inpatient Hospital Stay (INDEPENDENT_AMBULATORY_CARE_PROVIDER_SITE_OTHER)
Admission: RE | Admit: 2011-03-22 | Discharge: 2011-03-22 | Disposition: A | Discharge status: Home / Self Care | Payer: Medicare Other | Source: Ambulatory Visit | Attending: Family Medicine | Admitting: Family Medicine

## 2011-03-22 DIAGNOSIS — J029 Acute pharyngitis, unspecified: Secondary | ICD-10-CM

## 2011-03-22 DIAGNOSIS — J40 Bronchitis, not specified as acute or chronic: Secondary | ICD-10-CM

## 2011-03-22 DIAGNOSIS — J069 Acute upper respiratory infection, unspecified: Secondary | ICD-10-CM

## 2011-03-22 DIAGNOSIS — M940 Chondrocostal junction syndrome [Tietze]: Secondary | ICD-10-CM

## 2011-03-22 LAB — POCT RAPID STREP A (OFFICE): Streptococcus, Group A Screen (Direct): NEGATIVE

## 2011-03-24 ENCOUNTER — Encounter (INDEPENDENT_AMBULATORY_CARE_PROVIDER_SITE_OTHER): Payer: Medicaid Other | Admitting: Psychiatry

## 2011-03-24 DIAGNOSIS — F39 Unspecified mood [affective] disorder: Secondary | ICD-10-CM

## 2011-04-23 ENCOUNTER — Encounter (HOSPITAL_COMMUNITY): Payer: Medicare Other | Admitting: Psychiatry

## 2011-04-23 DIAGNOSIS — F319 Bipolar disorder, unspecified: Secondary | ICD-10-CM

## 2011-04-29 NOTE — H&P (Signed)
[name], [name]            ACCOUNT NO.:  [id number]   MEDICAL RECORD NO.:  [id number]          PATIENT TYPE:  IPS   LOCATION:  0401                          FACILITY:  [facility]   PHYSICIAN:  [name], M.D. DATE OF BIRTH:  10-12-1986   DATE OF ADMISSION:  02/06/2008  DATE OF DISCHARGE:                       PSYCHIATRIC ADMISSION ASSESSMENT   IDENTIFYING INFORMATION:  This is a 25 year old female who was  voluntarily admitted February 06, 2008.   HISTORY OF PRESENT ILLNESS:  The patient is here with a history of self-  inflicted injuries.  The patient has been feeling very depressed and  anxious and feeling suicidal with no plan.  She has been scratching  herself, biting and head and running into doors. She denies any  hallucinations or homicidal thoughts.  The patient had told her mother  what she was doing, although she states her  mother was aware as the  patient states she has done this to herself prior.  She denies any  specific stressors but she just states that there has been a lot of  fighting at [facility], the facility that she resides in.  Actions of  these other residents get her very frustrated.  The patient does state  that she wants to die, but denies that this is suicidal thinking and  again denies any specific plan to kill herself.  She reports compliance  with her medications.   PAST PSYCHIATRIC HISTORY:  The patient was here in October 2008.  She  sees Dr. Lolly Mustache for outpatient mental health services, also has a  therapist named Dr. West Carbo.   SOCIAL HISTORY:  This is a 25 year old female.  She currently resides at  [facility] since August 2008.  Will be living, she states, with her  mother and her roommate shortly and is looking forward to that.   FAMILY HISTORY:  Father with alcohol problems.   ALCOHOL DRUG HISTORY:  She denies any alcohol or drug use.   MEDICAL HISTORY:  Primary care [name] is none.  Medical problems are asthma.   MEDICATIONS:  Takes Ritalin LA 30 mg daily, Protonix 40 mg b.i.d.,  Abilify 40 mg at bedtime, trazodone 50 at bedtime, Valproic acid 2000 mg  at bedtime and Celexa 20 mg daily.  The patient feels the Celexa has  contributed to her feeling more depressed and does not want to take his  medication anymore.   DRUG ALLERGIES:  No known allergies.   PHYSICAL EXAM:  Was performed.  This is a young female in no acute  distress, somewhat overweight.  She denies any physical complaints.  Temperature is 98.4, 81 heart rate, 24 respirations, blood pressure is  116/64, 180 pounds, 5 feet 5 inches tall.  No acute distress, casually  dressed.  HEAD is atraumatic.  NECK:  Negative lymphadenopathy  CHEST is clear, no wheezes.  No rales.  BREAST:  Exam was deferred.  HEART:  Regular rate and rhythm.  No murmurs or gallops.  ABDOMEN:  Soft, nontender abdomen.  Pelvic and GU exam was deferred.  EXTREMITIES:  The patient moves all extremities.  No clubbing or edema.  SKIN:  She is fair skinned.  She does have noted greenish bruises to  both lower arms with some superficial scratch marks.  The patient has  dark purplish bruise to her right arm, self-inflicted.  NEUROLOGICAL findings are intact and nonfocal.   LABORATORY DATA:  None were drawn.   MENTAL STATUS EXAM:  She is fully alert, cooperative and she is casually  dressed, pleasant.  Her speech is appropriate, normal pace and tone.  No  pressure.  Her mood is neutral.  The patient's affect is she, she is  pleasant and cooperative, somewhat anxious, keeps looking out the  consultation window hoping that her mother will be here, but again  remains cooperative throughout the interview.  Her thought processes are  coherent, goal directed.  No evidence of any delusional thinking.  Denies any hallucinations, cognitive function intact.  Her memory is  good.  Judgment is poor, insight is fair.  Poor impulse control.   AXIS I:  Mood disorder NOS.  AXIS II:   Deferred.  AXIS III:  Asthma and GERD.  AXIS IV:  Problems with housing, other psychosocial problems related to  her burden of illness  AXIS V:  Current is 40.   PLAN:  Contract for safety.  We will stabilize her mood and thinking.  We will stop Celexa as the patient feels it has contributed to her  symptoms.  Will contact mother for discharge and other concerns.  We  will clarify her future housing arrangements.  Will continue to work on  her coping skills.  Her tentative length of stay is 2-4 days.      Landry Corporal, N.P.      [name], M.D.  Electronically Signed    JO/MEDQ  D:  02/07/2008  T:  02/07/2008  Job:  [id number]
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
## 2011-04-29 NOTE — Discharge Summary (Signed)
NAMEVIVIKA, [name]            ACCOUNT NO.:  [id number]   MEDICAL RECORD NO.:  [id number]          PATIENT TYPE:  IPS   LOCATION:  0507                          FACILITY:  [facility]   PHYSICIAN:  [name], M.D. DATE OF BIRTH:  1986/09/28   DATE OF ADMISSION:  09/12/2008  DATE OF DISCHARGE:  09/14/2008                               DISCHARGE SUMMARY   IDENTIFICATION:  This is a 25 year old single Caucasian female from  [place] who was admitted on a voluntary basis on September 12, 2008.   HISTORY OF PRESENT ILLNESS:  The patient states she is very depressed  with some suicidal ideation.  She had been to see her therapist Dr.  Juliane Lack, who referred her here.  Stressors include missing her  grandmother who passed away and conflict with her brother.  She states  she has borderline personality disorder and bipolar disorder.  She  denies any alcohol or drug use.  Appetite has been good.  I need help.   PAST PSYCHIATRIC HISTORY:  The patient was here in February 2009.  She  was diagnosed with mood disorder not otherwise specified.  She was  discharged on methylphenidate, Abilify, Depakene, trazodone and was to  follow up with Dr. Lolly Mustache.  Her therapist as indicated above is Dr.  Juliane Lack.   FAMILY HISTORY:  Father had a history of alcohol abuse.   ALCOHOL AND DRUG HISTORY:  No alcohol or drug use on the part of the  patient.   MEDICAL PROBLEMS:  Asthma.   MEDICATIONS:  1. Depakote ER 1000 mg in the morning and 1500 mg at bedtime.  2. Ritalin 30 mg in the morning.  3. Abilify 40 mg at bedtime.  4. Klonopin 0.5 mg in the a.m. and at bedtime.   DRUG ALLERGIES:  No known drug allergies.   PHYSICAL FINDINGS:  There were no acute physical or medical problems  noted.   ADMISSION LABORATORIES:  CMET was within normal limits.  Urine pregnancy  test negative.  Urinalysis was good.  MCV was 101.9.   HOSPITAL COURSE:  Upon admission, the patient was placed on  albuterol  inhaler 2 puffs q.i.d. p.r.n. asthma symptoms.  She was also placed on  Klonopin 0.5 mg p.o. t.i.d. p.r.n. anxiety.  In addition, she was  continued on Depakote ER 1000 mg in the morning and 1500 mg at bedtime,  Ritalin LA 30 mg in the morning, trazodone 50 mg at bedtime, Abilify 40  mg at bedtime, multivitamin 1 pill daily, Protonix 40 mg p.o. b.i.d.  The patient tolerated her medications well with no significant side  effects.  During the hospitalization, her mental status improved.  She  talked with her mother and felt ready to go back home to live with her.  She began to deny suicidal or homicidal ideation.  She was willing to  return to see Dr. Juliane Lack for therapy and Dr. Lolly Mustache for  medication management.  On September 14, 2008, mental status had improved  markedly from admission status.  The patient was less depressed.  The  patient's mood was euthymic.  Affect was  wider range.  There was no  suicidal or homicidal ideation.  No thoughts of self-injurious behavior.  No auditory or visual hallucinations.  No paranoia or delusions.  Thoughts were logical and goal-directed, thought content, had no  predominant theme.  Cognitive was grossly intact.  Insight fair,  judgment fair, impulse control fair.  It was felt the patient was safe  for discharge today.   DISCHARGE DIAGNOSES:  Axis I:  Bipolar disorder NOS.  Axis II:  Borderline personality disorder.  Axis III:  Asthma.  Axis IV:  Severe (problems with primary support group, burden of  psychiatric illness).  Axis V:  Global assessment of functioning was 50 upon discharge.  GAF  was 40 upon admission.  GAF highest past year was 70.   DISCHARGE PLAN:  There was no specific activity level or dietary  restrictions.   POSTHOSPITAL CARE PLANS:  The patient will see Dr. Lolly Mustache for followup  medication management on September 20, 2008, at 3 o'clock a.m.  She will  see Dr. Juliane Lack, her outpatient psychologist for  counseling.  Dr. Camila Li will call the patient with an appointment.   DISCHARGE MEDICATIONS:  1. Trazodone 50 mg at bedtime.  2. Ritalin LA 30 mg at 7:00 a.m.  3. Protonix 40 mg twice a day.  4. Abilify 40 mg at bedtime.  5. Vitamins as prescribed.  6. Klonopin 0.5 mg twice daily as needed for anxiety.  7. Depakene 250 mg/5 mL of syrup take 20 mL in the morning and 30 mL      at bedtime.  The patient preferred the liquid because she had a      difficult time swallowing the large Depakote pills.      [name], M.D.  Electronically Signed     BHS/MEDQ  D:  10/01/2008  T:  10/02/2008  Job:  [id number]
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
## 2011-04-30 ENCOUNTER — Ambulatory Visit: Payer: Medicaid Other | Admitting: Internal Medicine

## 2011-05-01 ENCOUNTER — Ambulatory Visit: Payer: Medicaid Other | Admitting: Internal Medicine
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
## 2011-05-02 NOTE — Op Note (Signed)
NAMEMOKSHA, DORGAN            ACCOUNT NO.:  [id number]   MEDICAL RECORD NO.:  [id number]          PATIENT TYPE:  OUT   LOCATION:  ULT                          FACILITY:  [facility]   PHYSICIAN:  Jon Gills, M.D.  DATE OF BIRTH:  September 17, 1986   DATE OF PROCEDURE:  09/20/2004  DATE OF DISCHARGE:  09/17/2004                                 OPERATIVE REPORT   PREOPERATIVE DIAGNOSIS:  Abdominal pain and vomiting.   POSTOPERATIVE DIAGNOSIS:  Abdominal pain and vomiting.   OPERATION PERFORMED:  Upper GI endoscopy with biopsy.   SURGEON:  Jon Gills, M.D.   ASSISTANT:  None.   ANESTHESIA:   DESCRIPTION OF PROCEDURE:  Following informed written consent, the patient  was taken to the operating room and placed under general anesthesia with  continuous cardiopulmonary monitoring.  The Olympus endoscope was advanced  by mouth and examined the esophagus, stomach and duodenum.  No definite  abnormalities were seen.  Several esophagus biopsies and duodenum biopsies  were obtained and submitted in [place] to pathology.  A solitary gastric  biopsy was obtained to investigate possible Helicobacter pylori.  The  endoscope was gradually withdrawn, the patient was awakened and taken to the  recovery room in satisfactory condition.  She will be released later today  to the care of her mother.  No specific return clinic appointment was  established pending results of the above biopsies.   DESCRIPTION OF TECHNICAL PROCEDURES USED:  Olympus GIF-140 endoscope.   SPECIMENS:  Esophagus times two, gastric times one, duodenum times two.       JHC/MEDQ  D:  09/23/2004  T:  09/23/2004  Job:  [id number]

## 2011-05-02 NOTE — Discharge Summary (Signed)
[place]  Patient:    [name], [name] Visit Number: [id number] MRN: [id number]          Service Type: PSY Location: 100 0101 02 Attending Physician:  Veneta Penton. Dictated by:    Daphine Deutscher, M.D. Admit Date:  06/26/2002   CC:         [facility] in [place]   Discharge Summary  DISCHARGE DIAGNOSES: 1. Salicylate poisoning, suicidal attempt. 2. Bipolar disorder. 3. Obsessive compulsive disorder. 4. Multiple suicidal attempts. 5. Oppositional defiant disorder.  DISCHARGE MEDICATIONS:  She was transferred to the [facility] with no other medications other than her psychiatric medications that she was already using, and they were supposed to be continued there.  They are adderall, Effexor, and Lithium.  CONDITION ON DISCHARGE:  Good medical condition.  FOLLOWUP:  She was instructed to follow up with her psychiatrist and pediatrician.  PROCEDURES:  None.  HISTORY OF PRESENT ILLNESS:  This is a 25 year old with an extensive psychiatric history that on the morning of admission tried to commit suicide by ingestion 10 to 11 pills of aspirin of 325 mg.  She was hospitalized the past month for another episode of suicidal attempt.  Her home regimen was Lithium, Effexor and ______, but she says she was becoming worse in the past couple of weeks after her grandmother died, and she tried to commit suicide today.  FAMILY HISTORY:  Significant for mother with depression, grandmother with depression.  By report, ______ with her mother, uncle, and brother.  She is a ninth grader and attends high school.  Has a lot of internet friends.  PHYSICAL EXAMINATION:  GENERAL:  The patient is alert and oriented, unable to focus and keep track of the conversation.  HEENT:  Pupils are equal, round and reactive to light and accommodation. Extraocular movements intact.  No nystagmus.  Cranial nerves III-XII tested. NECK:   No bruits, no JVD.  HEART:  Tachycardic with a physiological S3 gallop.  LUNGS:  Clear to auscultation bilaterally.  ABDOMEN:  Soft, nontender, nondistended, bowel sounds present.  EXTREMITIES:  No edema, pulses present.  ADMISSION LABORATORY DATA:  Salicylate level 45.1.  Sodium 143, potassium 4, chloride 104, bicarbonate 27, BUN 5, creatinine 0.9, glucose 106.  ABG showed a pH of 7.48, PCO2 34.1, PO2 108, bicarbonate 26, saturating 99% on room air. Lactic acid was 1.2.  HOSPITAL COURSE:  #1 - SUICIDAL POISONING:  This was only a mild poisoning.  Salicylate level was well below the dangerous toxicity level.  We followed her closely with serial BMETs and ABGs.  She closed the anion gap.  The lactic acid was decreased.  She received one 10 mEq of bicarbonate in the ER, but we did not administer anymore bicarbonate.  She was given only D5 normal saline 100 cc per hour, and was closely monitored.  #2 - SUICIDAL ATTEMPT:  We transferred her to [facility] for further workup and evaluation.  DISCHARGE LABORATORY DATA:  Sodium 142, potassium 3.4, chloride 112, CO2 25, BUN 3, creatinine 0.7, glucose 132.  pH 7.4, PCO2 38.6, PO2 96.8, bicarbonate 24.3.Dictated by:    Daphine Deutscher, M.D. Attending Physician:  Veneta Penton DD:  06/27/02 TD:  06/30/02 Job: [id number]
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
## 2011-05-02 NOTE — H&P (Signed)
NAME:  [name], [name]                  ACCOUNT NO.:  [id number]   MEDICAL RECORD NO.:  [id number]                   PATIENT TYPE:  INP   LOCATION:  0100                                 FACILITY:  [facility]   PHYSICIAN:  Beverly Milch, MD                  DATE OF BIRTH:  1986-02-09   DATE OF ADMISSION:  04/02/2004  DATE OF DISCHARGE:                         PSYCHIATRIC ADMISSION ASSESSMENT   IDENTIFICATION:  This is a 25 year old female 10th grade student at [employer] who is admitted emergently  voluntarily at the requirement of Dr. Mariana Single with the patient stating that  she wanted to come and mother stating that she wants the medications  adjusted as the patient is having panic attacks.  The patient apparently,  over the last two weeks, has made some suicide threats and threats to cut  herself.  She has attempted suicide by overdosing with aspirin as well as  cutting herself in the past.  Mother feels Wilhemena Durie is causing  irritability.  Mother has been stopping medication such as Depakote recently  because she feared the patient was gaining weight, even though she has lost  four pounds since her last admit in December 2004.   HISTORY OF PRESENT ILLNESS:  The patient is known from four previous  hospitalizations at the [facility]; June 9-17, 2003, July 13-  17, 2003, March 1-5, 2004, and November 30-November 17, 2003.  The patient in  the past has been treated with a number of medications that are difficult to  slowly tabulate, but include Cogentin, Geodon, Seroquel, Remeron, Effexor,  BuSpar, Adderall, lithium and now at the time of admission she has restarted  Depakote 500 mg q.h.s., Abilify 15 mg every morning and trazodone 100 mg  q.h.s. and mother wants her off of Strattera 80 mg every day.  Although the  patient states that she is having panic attacks, the patient seems to be  exhibiting crying spells, irritability,  moodiness, dissatisfaction and  general resistance to the expectations of others.  Although the mother  chooses to preformulate that these are symptoms due to medications, it  appears that the most likely offending trigger is stopping Depakote.  Mother  states that she worries that the patient is gaining weight, though the  patient has lost four pounds since her last admission.  The patient and  mother do not agree with the tentative formulation of anxiety disorder as  symptoms are reviewed.  Apparently the patient's anxiousness seems to be  more of reflection of mood instability.  The patient has limited coping  skills and social skills, as does mother.  Mother insists upon  hospitalization, when the patient wants that.  Mother reinforces the  patient's regressions and becomes overwhelmed herself and acknowledges that  she herself has depression.  The patient is under the outpatient care of Dr.  Mariana Single.  She does not acknowledge other therapy at this time.  The patient  does not address whether she is still engaged to someone that she would not  discuss last time.  She states that school is good, she has friends and that  home is good.  She notes that her brother used to take medications for  bipolar disorder.  She uses no illicit drugs or alcohol.  She states at the  time of this admission that she is not sexually active.   PAST MEDICAL HISTORY:  The patient has a history of asthma.  She does wear  glasses.  She had menarche at age 55 and her last menses was April 02, 2004.  She reports a history of migraine.  She and mother state that she gained  weight on Depakote.  Her last weight in December 2004 was 141 pounds and at  the time of admission she is 137 pounds.  Weight does not appear to be a  significant issue at this time.  Mother wants to keep the dose of Depakote  as low as possible, but acknowledges that Depakote helps the patient's moods  and if she has anxiety, helps this as  well, in the past.  The patient has no  medication allergies.  She has no history of seizure or syncope.  She has no  heart murmur or arrhythmia.   REVIEW OF SYSTEMS:  The patient denies difficulty with gait, gaze or  countenance.  She denies exposure to communicable disease or toxins.  She  denies rash, jaundice or purpura.  She denies chest pain, palpitations or  presyncope.  She denies abdominal pain, nausea, vomiting or diarrhea.  She  denies dysuria or arthralgia.   Immunizations are up to date.   FAMILY HISTORY:  Father had alcohol abuse and was emotionally and physically  maltreating of the patient.  The patient and mother were dependent upon the  maternal grandparents in the past with maternal grandfather dying in  approximately the year 2000 and maternal grandmother in February 2003.  The  patient's older brother, age 24, has bipolar disorder and was on medication  in the past, though apparently not now.  Mother has depression requiring  hospitalization in the past.   SOCIAL AND DEVELOPMENTAL HISTORY:  There are no complications or  consequences to gestation, delivery or neonatal period known.  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  She is in the 10th grade at  [employer] in the past.  The patient will not  discuss whether she is engaged any longer.  She does not use alcohol or  illicit drugs.  She will state specifically at this time that she is not  sexually active.   ASSETS:  The patient is somewhat more honest than over her last admission.   MENTAL STATUS EXAM:  Height is 65 inches and weight is 137 pounds with blood  pressure 100/66 and heart rate 89 supine and standing blood pressure 102/66  with heart rate of 91.  Neurological exam is generally intact.  The patient  has no abnormal involuntary movements.  She has no soft neurologic findings, except for  her vague cognitive limitations that are difficult to assess, as  the patient does not engage directly in assessment.  She is regressive and  resistant in her interpersonal style.  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  She has no pathologic reflexes or  abnormalities in muscle strengths or tone.  Her  gait and gaze are intact,  though she seems somewhat clumsy.  The patient's regression and resistance  as well as mother's resistance undermine outpatient treatment.  Mother seems  more anxious and overwhelmed than the patient, who is more resistant to  change.  The patient seems to manifest more mood instability than she does  panic and Depakote has been restarted for the last several days.  She does  not present hallucinations or delusions.  Suicide ideation and urge to cut  are reportedly resolved at the time of admission, though mother seems  anxious that these are still unstated treatment needs.  The patient and  mother expect ADHD and anxiety stabilization, but mood stabilization seems  more paramount as well as containment of oppositionality.   IMPRESSION:   AXIS I:  1. Bipolar disorder, mixed.  2. Oppositional defiant disorder.  3. Attention deficit hyperactivity disorder, combined type, moderate     severity.  4. Rule out anxiety disorder, not otherwise specified (provisional     diagnosis).  5. Parent-child problem.  6. Other specified family circumstances.  7. Noncompliance with treatment.   AXIS II:  1. Rule out borderline intellectual functioning (provisional diagnosis).  2. Rule out developmental coordination disorder (provisional diagnosis).  3. Rule out phonological disorder with apraxia of the speech (provisional     diagnosis).   AXIS III:  1. Asthma.  2. History of migraine.  3. Mother is concerned about weight gain, though weight is down four pounds     since last December.   AXIS IV:  Stressors:  Family - Severe, acute and  chronic; phase of life -  severe, chronic; school - moderate to severe, chronic.   AXIS V:  Global assessment of functioning of 46 with highest in the last  year of 58.   PLAN:  The patient is admitted as required for brief stabilization including  of medication and mood and resistant style.  We will change Strattera to  Concerta 36 mg every morning.  Mother refuses to increase Depakote, but we  will check a Depakote level the following day.  We will continue Abilify.  Mother declines Cymbalta instead of Concerta and anxiety does not seem to be  paramount concern for the patient or family.  Behavioral therapy, family  therapy and learning and social skills training are planned.                                               Beverly Milch, MD    GJ/MEDQ  D:  04/03/2004  T:  04/03/2004  Job:  [id number]

## 2011-05-02 NOTE — H&P (Signed)
   NAME:  [name], [name]                      ACCOUNT NO.:  [id number]   MEDICAL RECORD NO.:  [id number]                   PATIENT TYPE:  INP   LOCATION:  0106                                 FACILITY:  [facility]   PHYSICIAN:  Nawar M. Alnaquib, M.D.             DATE OF BIRTH:  01-Jan-1986   DATE OF ADMISSION:  02/13/2003  DATE OF DISCHARGE:                         PSYCHIATRIC ADMISSION ASSESSMENT   HISTORY OF PRESENT ILLNESS:  A 25 year old white female who is a known  diagnosis of bipolar affective disorder on medications for a long time.  She  was reported to have been recently depressed for the last 4 weeks, having a  lot of agitation, crying spells, and cutting on herself in the form of self  mutilation, as well as biting on herself.  She was having difficulty falling  asleep and with frequent awakenings.  Mom is reporting that her medications  were no good, and she had recently been on Effexor which was subsequently  discontinued.  Mom seems to be very distressed about the medication  situation and was asking to have medications changed.  Suicidal ideations  were present on admission.  She is denies these however when she was  interviewed on the ward.  She has a lot of fears about her mother is going  to die on her, particularly with the anniversary of her grandmother's death  a year ago.  Stresses include also grandfather's death about 3-4 years ago.   JUSTIFICATION FOR 24-HOUR CARE:  Dangerous to self.   PAST PSYCHIATRIC HISTORY:  Previous admissions to [facility], states __________  last time, and she sees Dr. Molli Hazard  __________ health therapy.  __________  has been going on for a year.   SUBSTANCE ABUSE HISTORY:  None.   PAST MEDICAL HISTORY:  Unremarkable.   ALLERGIES:  None known.  __________.                                               Nawar M. Alnaquib, M.D.    NMA/MEDQ  D:  02/14/2003  T:  02/14/2003  Job:  [id number]

## 2011-05-02 NOTE — Discharge Summary (Signed)
NAME:  [name], [name]                      ACCOUNT NO.:  [id number]   MEDICAL RECORD NO.:  [id number]                   PATIENT TYPE:  INP   LOCATION:  0101                                 FACILITY:  [facility]   PHYSICIAN:  Beverly Milch, MD                  DATE OF BIRTH:  [birth year]   DATE OF ADMISSION:  04/02/2004  DATE OF DISCHARGE:  04/04/2004                                 DISCHARGE SUMMARY   IDENTIFICATION:  This 25 year old female, 10th grade student at [employer], no longer in the [employer], was  admitted at the requirement of Dr. Mariana Single for inpatient stabilization of  reported panic after stopping Depakote.  Mother was most interested in  changing ADHD medications and the patient herself has perseveratively  fixated her social style becoming disorganized when her mood is less stable,  which she projects and formulates as anxiety.  She and mother note that she  has cut herself and overdosed with aspirin in the past and could be suicidal  again.   SYNOPSIS OF THE PRESENT ILLNESS:  This is the sixth hospitalization at the  [facility] for the patient.  At the time of admission she is  taking Strattera 80 mg every morning, Depakote 500 mg at bedtime, Abilify 15  mg every morning and trazodone 100 mg at bedtime.  The patient has just  restarted Depakote several days ago with mother being concerned about weight  gain, even though the patient weighs four pounds less than she did in  December 2004 at the time of her last admission.  She has had multiple  medications in the past including Cogentin, Geodon, Seroquel, Remergon,  Effexor, BuSpar, Adderall, Depakote and lithium at various times besides her  current medications.  Father had alcohol abuse and was emotionally and  physically maltreating.  Mother and patient were dependent upon maternal  grandparents with the grandfather dying in 11 and the grandmother in  February  2003.  Mother and patient have limited intent or skill to stabilize  the psychosocial function with mother having depression and requiring  hospitalization in the past as well.  Older brother had bipolar disorder,  but is no longer taking medication.   INITIAL MENTAL STATUS EXAM:  The patient had a regressive resistant style  that undermines her outpatient care.  She is perseverative in her social  fixations with mother being anxious while the patient seems to find some  secondary gain in her perseverative regressive style.  The patient does seem  to be somewhat destabilized in mood after stopping Depakote.  However, by  the time of her admission, her suicide ideation and urge to cut herself are  resolved and she expects discharge immediately after admission.  Mother is  interested in changing the Strattera to another medication.   LABORATORY FINDINGS:  On the day of discharge, the patient had a Depakote  level of  55.7 with reference range of 50 to 100, approximately nine hours  after her last dose.   HOSPITAL COURSE AND TREATMENT:  The patient had a general medical exam by  Edwena Blow, P.A.C.  that again reported that she was hospitalized  because of four panic attacks in the last week.  She reported that school is  good and that she does have friends and that home is okay.  She had menarche  at age 9 and reported a history of asthma.  She does wear glasses.  Her  height was 65 inches and weight was 137 pounds, down from 141 in December of  2004 at the [facility].  Her blood pressure on admission was  100/66 with heart rate of 89 sitting and 102/66 with heart rate of 91  standing.  At the time of discharge, her supine blood pressure was 94/63  with heart rate of 76 and standing blood pressure 100/69 with heart rate of  108.  The patient participated in all aspects of active inpatient treatment  including group, milieu, behavioral, individual, special education,  anger  management, family and occupational and therapeutic recreational therapies.  The patient began repeatedly asking for discharge and stating that she was  okay shortly after admission.  Her mother seemed to follow a similar line of  thought, but wanting medications changed.  They declined Cymbalta and  preferred a stimulant.  She was switched from Strattera to Concerta.  We  discussed increasing Depakote, but mother is apprehensive as she fears that  the patient will gain more weight.  The patient's suicide ideation and  ideation about self cutting were confronted repeatedly and were not present  or significant.  The patient had no significant anxiety during her hospital  stay, though she does have a perseverative social fixation upon replacement  for family security, which had been seeking a fiance last admission.  The  patient seems clinically to possibly have borderline intellectual  functioning and question of developmental coordination disorder is also  evident clinically.  She reports a history of migraine.  She had no  migraine, asthma or other physical difficulties during her hospital stay.  She had no anxiety and no suicidality or self injury.   FINAL DIAGNOSES:   AXIS I:  1. Bipolar disorder, mixed.  2. Oppositional defiant disorder.  3. Attention deficit hyperactivity disorder, combined type, moderate in     severity.  4. Parent-child problem.  5. Other specified family circumstances..  6. Noncompliance with treatment.   AXIS II:  1. Rule out developmental coordination disorder (provisional diagnosis).  2. Rule out borderline intellectual functioning (provisional diagnosis).  3. Rule out phonological disorder with apraxia of speech (provisional     diagnosis).   AXIS III:  1. Asthma.  2. History of migraine.   AXIS IV:  Stressors:  Family - Severe, acute and chronic; phase of life -  severe, acute and chronic; school - moderate to severe, chronic.  AXIS V:   Global assessment of functioning on admission was 46 with highest  in the last year estimated at 58 and discharge global assessment of  functioning was 53.   PLAN:  The patient was discharged to mother on the following medications  after final family therapy session.  In the final family therapy session  mother concluded that she thinks medications cause weight gain that makes  the patient panic and mother also suspected that a 24 year old female coworker  had one month ago verbally  requested fellatio from the patient and the  patient had refused.  Mother noted that father has yelling devaluation of  the patient.  Mother noted that the patient will have no further contact  with the 24 year old female.  The patient was discharged on the following  medications:  1. Concerta 36 mg every morning, quantity No. 30 prescribed with no refill.  2. Abilify 15 mg every morning, samples, No. 35 tablets provided.  3. Depakote 500 mg tablet every bedtime, having supply at home, and she may     need to increase to two tablets at bedtime if mood swings persist or if     the patient has significant anxiety with blood level appropriate for     increasing at 55.  4. Trazodone 100 mg tablet every bedtime, having supply at home.  5. Her Strattera was discontinued.   She will see Adelene Amas, Ph.D. for individual and family therapy, Apr 15, 2004 at 1500 and will see Dr. Mariana Single, April 12, 2004 at 10:00 a.m. for  psychiatric follow-up.  Crisis and safety plans are outlined if needed.  Weight control diet is outlined as well as activity being encouraged.   There is a signed release on the chart for both courtesy copies.                                               Beverly Milch, MD    GJ/MEDQ  D:  04/05/2004  T:  04/05/2004  Job:  [id number]   cc:   Laney Pastor. Alnaquib, M.D.  Fax: [phone]   Adelene Amas, Ph.D.  [address]  [place], [place] [address]

## 2011-05-02 NOTE — Discharge Summary (Signed)
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 Alnaquib, M.D., for inpatient stabilization of  suicidal ideation or threats.  The patient was suspected of having disrupted  her usual routine over Thanksgiving and therefore decompensated.  The  patient is very treatment-dependent.  The patient states subsequently that  she did not want to attend work at the [employer] on the day of  admission because she was feeling bored and tired and therefore lied about  the suicidal ideation.  The patient had been noncompliant with Strattera at  times but otherwise reported compliance with medications.  For full details,  please see the typed admission assessment.   HISTORY OF PRESENT ILLNESS:  The patient has had multiple hospitalizations  at the [facility] in the past though she has gone one of her  longest times without hospitalization in a recent treatment history with the  last being in March 2004.  The patient has not been self-injurious in the  interim or otherwise dangerous in her behavior.  She has stabilized in  relationships somewhat, now having a boyfriend that she states she is  engaged to marry.  The patient finds her job somewhat difficult but she not  very capable of clarifying why.  She notes she complains a lot on the job.  She describes stocking shelves on the job.  She maintains unrealistically  that everything will be fine if she  marries.  At the time of admission, she  is on Abilify 15 mg nightly, Cogentin 1 mg nightly, trazodone 100 mg  nightly, and Strattera 80 mg every morning.  Cognitive capabilities have  been questioned.  Mother has been hospitalized with depression in the past.   INITIAL MENTAL STATUS EXAM:  The patient stated she was lying about  suicidality though it is difficult to tell when she is lying about lying.  The patient is convoluted.  She is concrete and rather moribund in  appearance though she is no longer morbid in her identification with rap  music and associated themes.  She has no hypomanic symptoms now though she  reports she has bipolar disorder.  The patient has hypokinesia and  bradykinesia.  She reports self-cutting three weeks ago and has overdosed as  her most recent suicide attempt, which was at least nine months ago.   LABORATORY FINDINGS:  CBC is normal with white count 4300, hemoglobin 12.5,  MCV 87, and platelet count 179,000.  Comprehensive metabolic panel was  normal with sodium 141, potassium 3.9, glucose 91, creatinine 1, calcium 10,  albumin 3.9, AST 13, ALT 10, and GGT 9.  RPR was nonreactive.  TSH was  normal at 0.389 with reference range being 0.35-5.5.  Urine pregnancy test  was negative.  Urine drug screen was negative.  Urinalysis was normal with  specific gravity of 1.028.   HOSPITAL COURSE AND TREATMENT:  Every effort was made to discern the  accuracy of the patient's statements and she was most consistent in stating  that she was distorting about suicidality because she did not feel like  going to work that day.  The patient did not feel that Thanksgiving holiday  had been stressful nor did mother.  The patient did not reveal other active  psychiatric symptoms other than complaining frequently to the nurses,  particularly about wanting discharge but at times about other minor bodily  questions.  The patient maintained a rather abulic posture and faces though   she could become affectively recruited in her voice at times with certain  subjects.  There were questions as to whether she was still grieving the  loss of her grandmother who died one year ago.  There were questions as to  whether she may be overwhelmed with the prospect of getting married though  she stated this was an asset.  Mother is somewhat hesitant about change for  the patient.  As the patient was in the hospital and as she had some  parkinsonian side effects as well as still requiring Cogentin and that these  things may interfere with work as much as psychological issues had in the  past, we did lower the patient's Abilify to 10 mg nightly and her trazodone  to 50 mg nightly.  She was sleeping soundly during her hospital stay even  with these reductions.  Mother did work on the reasons and agreed by the  time of discharge to continue these adjustments at home.  Mother and the  patient had a final family session on the hospital unit, particularly for  establishing crisis and safety plans should there be further concerns.  Mother thinks the patient hates the job because it is too slow and that the  supervisor does not like the patient and that the patient has a difficult  time being around the other people.  The patient wants a different job.  We  worked on ways to constructively approach these concerns.  She was  discharged home in improved condition and was free of any suicidal ideation  or active aggression and was free of any mood or psychotic difficulties.  The patient does have some cognitive limitations but these are difficult to  assess further in the current setting and would require standardized  psychometric testing and measurement.   FINAL DIAGNOSES:   AXIS I:  1. Oppositional defiant disorder.  2. Attention-deficit hyperactivity disorder, combined type, moderate     severity.  3. Bipolar disorder, depressed, mild by history. 4. 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  5. Parent-child problem.  6. Other specified family circumstances.  7. Other interpersonal problems.  8. Noncompliance with Strattera.   AXIS II:  Rule out borderline intellectual functioning (provisional  diagnosis).   AXIS III:  1. Asthma.  2. Weight gain by history.   AXIS IV:  Stressors: Family- severe, predominantly acute and chronic; phase  of life- severe, predominantly acute; employment- moderate, predominantly  acute.   AXIS V:  Global assessment of functioning at the time of admission was 45  with highest in the last year estimated at 58 and discharge global  assessment of functioning 52.   PLAN:  The patient was discharged in improved condition and with safety.  She is discharged on the following medications:  1. Strattera 40 mg to take two every morning, quantity #60 with no refill     prescribed.  2. Abilify 10 mg to use one every bedtime, quantity #30 with no refill     prescribed.  3. Cogentin 1 mg at bedtime, quantity #30 with no refill.  4. Trazodone 50 mg at bedtime, quantity #30 with no refill prescribed.   Mother reviewed at discharge with myself and with nursing the reductions in  Abilify and trazodone and will call if there are any interim exacerbation of  symptoms.  Hopefully, the patient will have fewer parkinsonian side effects  and still be able to function as well from a mood perspective.  She will  then hopefully be able to engage in work more fulfillingly and receive  acknowledgement of such.  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  She  has crisis and safety plans are established if needed.  She will see Adelene Amas November 17, 2003, at 1600 for therapy and will see Nawar M.  Alnaquib, M.D., December 07, 2003, at 10:30 for psychiatric followup.  There  is a signed release on the chart for both courtesy copies.                                               Beverly Milch, MD    GJ/MEDQ  D:   11/18/2003  T:  11/20/2003  Job:  [id number]   cc:   Adelene Amas  [address]  [place], [place] [address]   Laney Pastor. Alnaquib, M.D.  Fax: [phone]

## 2011-05-02 NOTE — Discharge Summary (Signed)
[facility]  Patient:    [name], [name] Visit Number: [id number] MRN: [id number]          Service Type: PSY Location: 100 0104 02 Attending Physician:  Veneta Penton. Dictated by:   Veneta Penton, M.D. Admit Date:  05/23/2002 Disc. Date: 05/31/02                             Discharge Summary  REASON FOR ADMISSION:  This 25 year old white female was admitted complaining of depression with suicidal ideation with a plan to cut her wrists.  For further history of present illness, please see the patients psychiatric admission assessment.  PHYSICAL EXAMINATION:  At the time of admission was significant for a history of gastroesophageal reflux disease for which she took Zantac p.r.n. on an outpatient basis.  She had an otherwise unremarkable physical examination.  LABORATORY EXAMINATION:  The patient underwent a laboratory work-up to rule out any other medical problems contributing to her symptomatology.  A urine drug screen was positive only for amphetamines consistent with the patient taking Adderall.  A urine probe for gonorrhea and chlamydia were negative.  An RPR was nonreactive.  A CBC showed an MCHC of 34.1 and was otherwise unremarkable.  Basic metabolic panel was within normal limits.  Hepatic panel was unremarkable.  TSH and free T4 were within normal limits.  A GGT was within normal limits.  Lithium level on admission was 1.37.  Urine pregnancy test was negative.  A UA was unremarkable.  The patient received no x-rays, no special procedures, no additional consultations.  She sustained no complications during the course of this hospitalization.  HOSPITAL COURSE:  The patient was continued on Remeron, Adderall and Lithobid. She tolerated these medications well without side effects.  She was begun on a trial of Effexor XR and titrated up to a therapeutic dose.  At the time of discharge, she denies any homicidal or suicidal  ideation.  Her affect and mood have improved.  Her concentration has increased.  She is actively participating in all aspects of the therapeutic treatment program, no longer appears to a danger to herself or others, has shown no evidence of a thought disorder throughout this hospital course, and consequently is felt to have reached her maximum benefits of hospitalization and is ready for discharge to a less restricted alternative setting.  CONDITION ON DISCHARGE:  Improved  FINAL DIAGNOSIS: Axis I:    1. Bipolar disorder, mixed type, severe, without psychosis.            2. Rule out major depression.            3. Attention deficit hyperactivity disorder, combined type.            4. Post traumatic stress disorder.            5. Obsessive-compulsive disorder.            6. Oppositional-defiant disorder.            7. Rule out conduct disorder.            8. Cannabis abuse. 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 Axis III:  Gastroesophageal reflux disease. Axis IV:   Current psychosocial stressors are severe. Axis V:    Code 20 on admission, code 30 on discharge.  FURTHER EVALUATION AND TREATMENT RECOMMENDATIONS: 1. The patient is discharged to home. 2. She is discharged on an unrestricted  level of activity and a regular diet. 3. She will follow up with Dr. Kemper Durie at [facility]    for all further aspects of her psychiatric care and consequently I will    sign off on the case at this time.  She will follow up with her primary    care physician for all further aspects of her medical care. DISCHARGE MEDICATIONS: 1. Effexor XR 150 mg p.o. q.a.m. with food. 2. Adderall XR 30 mg p.o. q.a.m. 3. Remeron 15 mg p.o. q.h.s. 4. Lithobid 300 mg in the morning and 600 mg p.o. q.h.s. Dictated by:   Veneta Penton, M.D. Attending Physician:  Veneta Penton DD:  05/31/02 TD:  05/31/02 Job: [id number]

## 2011-05-02 NOTE — H&P (Signed)
[facility]  Patient:    [name], [name] Visit Number: [id number] MRN: [id number]          Service Type: PSY Location: 100 0101 02 Attending Physician:  Veneta Penton. Dictated by:   Carolanne Grumbling, M.D. Admit Date:  06/26/2002                     Psychiatric Admission Assessment  DATE OF ADMISSION:  June 26, 2002  CHIEF COMPLAINT:  The patient was admitted to the hospital in transfer from [facility] where she had been admitted after taking an overdose and cutting her wrist.  HISTORY OF PRESENT ILLNESS:  The patient said there were no precipitating factors; nothing bad happened, nothing changed, she just had the feeling that she wanted to kill herself so she tried.  She said she always has this feeling that life is not worth living.  Consequently, she feels suicidal more often than not.  She said nothing had really changed in her life to make her feel this way; it is just the way it was.  She said the only thing she could think of that might have made a difference was her grandmothers death a year ago but when asked about the relationship with the grandmother, she could not really think of anything but with time she said her grandmother did listen to her and not judge her but that was said with prompting from me about what she might have missed about her grandmother.  She basically did not produce any story about why she does what she does or feels what she feels.  PAST PSYCHIATRIC HISTORY:  She has been in this hospital about three times in the last year, the last time being in June 2003.  She also has been in [place] on one occasion.  Apparently all the admissions are very similar.  She says she does not see an outpatient therapist but she plans to start seeing one.  SUBSTANCE ABUSE HISTORY:  She has a history of using marijuana but no other drugs or legal problems.  PAST MEDICAL HISTORY:  Gastroesophageal reflux  disorder.  ALLERGIES:  She has no known allergies to medicine.  MEDICATIONS: 1. Effexor. 2. Adderall. 3. Lithobid. 4. Remeron.  FAMILY, SCHOOL, AND SOCIAL HISTORY:  She apparently has a learning disability or behavioral problems because she goes to the special school in this building.  She says she gets along with her peers.  She does not particularly like school but her grades are okay.  She admitted to having a learning disability but did not know exactly what kind.  She says she lives with her mother and her 33 year old brother.  She believes she gets along with them okay.  Her father has been separated from her mother since she was about 73 or 79 years old, she says.  She sees him whenever she wants to and he seems okay. Neither parent is remarried and family life, she says, is not an issue.  MENTAL STATUS EXAMINATION:  At the time of the initial evaluation revealed an alert, oriented girl who came to the interview willingly and was cooperative. She was fairly flat in her affect.  She admitted to feeling depressed and that life was not worth living, which she says she always has and having been in the hospital has not changed at all.  There was no evidence of any thought disorder or other psychosis.  Short and long-term memory were intact as  measured by her ability to recall recent and remote events in her own life. Judgment currently seemed poor based on her general attitude toward life. Insight was minimal.  Intellectual functioning seemed about average. Concentration was adequate.  Insight was lacking.  ADMISSION DIAGNOSES: Axis I:    1. Bipolar disorder, mixed, severe without psychosis, by history.            2. Attention-deficit/hyperactivity disorder.            3. Post-traumatic stress disorder by history.            4. Obsessive-compulsive disorder by history.            5. Oppositional defiant disorder.            6. Cannabis abuse. Axis II:   Learning disability, not  otherwise specified. Axis III:  Gastroesophageal reflux disease. Axis IV:   Mild. Axis V:    35/55  ASSETS AND STRENGTHS:  The patient knows what is expected of being in the hospital.  INITIAL PLAN OF CARE:  Stabilize to the point where she is not having suicidal ideation, if possible, and that she comes up with some explanation about why she does what she does so that she can guide her behavior more appropriately. Dr. Haynes Hoehn will be the attending.  ESTIMATED LENGTH OF STAY:  Three to five days. Dictated by:   Carolanne Grumbling, M.D. Attending Physician:  Veneta Penton DD:  06/27/02 TD:  06/28/02 Job: [id number]

## 2011-05-02 NOTE — H&P (Signed)
   NAME:  [name], [name]                      ACCOUNT NO.:  [id number]   MEDICAL RECORD NO.:  [id number]                   PATIENT TYPE:  INP   LOCATION:  0101                                 FACILITY:  [facility]   PHYSICIAN:  Nawar M. Alnaquib, M.D.             DATE OF BIRTH:  1986-05-26   DATE OF ADMISSION:  02/13/2003  DATE OF DISCHARGE:                         PSYCHIATRIC ADMISSION ASSESSMENT   ADDENDUM   CURRENT MEDICATIONS:  1. Geodon 160 mg at bedtime.  2. Lithium 600 mg b.i.d.  3. Adderall XR 30 mg q.a.m.   SOCIAL HISTORY:  The patient is living in [place].  Mom is 57 years old,  works at [employer] in [place].  Brother is 73 years old, employed, has finished  school.  Parents are separated.  Dad had a drinking problem.  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   FAMILY HISTORY:  Older brother has bipolar affective disorder.   MENTAL STATUS EXAM:  The patient reports to be depressed recently with  frequent suicidal ideation, irritability, anger episodes very frequent,  feelings of hopelessness, helplessness, anxiety specific about that mom  might die on her.  That was after she had lost two grandparents.  She  appears to have a labile mood, irritable, emotional, and tearful at times.  She was of fairly average IQ but has poor insight and judgment.   ADMISSION DIAGNOSES:   AXIS I:  Bipolar affective disorder, depressed.   AXIS II:  Deferred.   AXIS III:  None.   AXIS IV:  Psychosocial stressors.   AXIS V:  Global assessment of functioning 35.   ASSETS AND STRENGTHS:  Supportive mom, fairly good IQ.   INITIAL PLAN OF CARE:  Medication change.  Mom _________ medication changes.  She does not believe that the current medications are not working and she  also left the __________.    ESTIMATED LENGTH OF STAY:  One week.   POST HOSPITAL CARE PLAN:  Initial discharge plans are to home.     Nawar M. Alnaquib, M.D.    NMA/MEDQ  D:  02/15/2003  T:  02/15/2003  Job:  [id number]
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
## 2011-05-02 NOTE — Discharge Summary (Signed)
[facility]  Patient:    [name], [name] Visit Number: [id number] MRN: [id number]          Service Type: PSY Location: 100 0101 02 Attending Physician:  Veneta Penton. Dictated by:   Veneta Penton, M.D. Admit Date:  06/26/2002 Discharge Date: 07/04/2002                             Discharge Summary  REASON FOR ADMISSION:  This 25 year old white female was admitted for inpatient psychiatric stabilization following a drug overdose as a suicide attempt as well as attempting to cut her wrists.  For further history of present illness, please see the patients psychiatric admission assessment.  PHYSICAL EXAMINATION:  Significant for history of old and new superficial self-inflicted lacerations of both forearms, as well as having pulled out both her eyelashes and some of her eyebrows.  She had a history of learning disabilities and an otherwise unremarkable physical examination.  LABORATORY EXAMINATION:  The patient underwent a laboratory work-up to rule out any medical problems contributing to her symptomatology.  A urine probe for gonorrhea and chlamydia were negative.  Lithium level was 1.11.  TSH and free T4 were unremarkable.  Hepatic panel showed an albumin of 3.4 and was otherwise unremarkable.  An RPR was nonreactive.  Further laboratory workup was done at the emergency room at [facility] prior to the patient being transferred to this facility for more definitive psychiatric stabilization.  HOSPITAL COURSE:  On admission, the patient was psychomotor agitated.  Affect and mood were depressed, irritable, and angry.  She displayed poor impulse control, decreased concentration and attention span, and significant cognitive processing deficits.  She was profoundly oppositional and defiant.  She was continued on Lithobid, Remeron, and Adderall XR and Effexor was titrated up to a therapeutic dose.  At the time of discharge, she is actively  participating in all aspects of the therapeutic treatment program.  She denies any homicidal or suicidal ideation.  Her affect and mood have improved.  Her concentration has increased.  She has displayed no obsessive or compulsive behaviors while hospitalized.  She remains somewhat oppositional and defiant but is motivated for outpatient therapy, has displayed no evidence of a thought disorder and has displayed no behavior that was a danger to herself or others while hospitalized.  Consequently it is felt she has reached her maximum benefits of hospitalization and is ready for discharge to a less restricted alternative setting.  CONDITION ON DISCHARGE:  Improved  FINAL DIAGNOSIS: Axis I:    1. Bipolar disorder, mixed type, severe, without psychosis.            2. Attention deficit hyperactivity disorder, combined type.            3. Post traumatic stress disorder by history.            4. Obsessive-compulsive disorder by history.            5. Oppositional-defiant disorder.            6. Rule out conduct disorder.            7. Cannabis abuse. 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 Axis III:  Gastroesophageal reflux disease by history. Axis IV:   Current psychosocial stressors are severe. Axis V:    Code 20 on admission, code 30 on discharge.  FURTHER EVALUATION AND TREATMENT RECOMMENDATIONS: 1. The patient is discharged to  home. 2. She is discharged on an unrestricted level of activity and a regular diet. 3. She will follow up with Dr. Chestine Spore, her outpatient psychiatrist at [facility] for all further aspects of her psychiatric    care and consequently I will sign off on the case at this time.  The    patient will follow up with her primary care physician for all further    aspects of her medical care.  DISCHARGE MEDICATIONS: 1. Effexor XR 225 mg p.o. q.a.m. with food. 2. Adderall XR 30 mg p.o. q.a.m. 3. Lithobid 300 mg p.o. q.a.m. and  600 mg q.h.s. 4. Remeron 30 mg p.o. q.h.s. Dictated by:   Veneta Penton, M.D. Attending Physician:  Veneta Penton DD:  07/04/02 TD:  07/04/02 Job: [id number]
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
## 2011-05-02 NOTE — Discharge Summary (Signed)
NAME:  [name], BINNING                      ACCOUNT NO.:  [id number]   MEDICAL RECORD NO.:  [id number]                   PATIENT TYPE:  INP   LOCATION:  0103                                 FACILITY:  [facility]   PHYSICIAN:  Nawar M. Alnaquib, M.D.             DATE OF BIRTH:  September 04, 1986   DATE OF ADMISSION:  02/13/2003  DATE OF DISCHARGE:  02/17/2003                                 DISCHARGE SUMMARY   IDENTIFYING INFORMATION:  This is a 25 year old white female.   REASON FOR ADMISSION:  Depression with agitation with a known diagnosis of  bipolar affective disorder.  On medication for a long time.  The depression,  recently, has been lasting over a month with a lot of agitation and crying  spells, cutting on herself in the form of self-mutilation as well as biting  on herself.  She also had difficulty falling asleep with frequent  awakenings.  Mother had reported that the medication she was on was not  doing her any good.  Recently, her mom had discontinued her Effexor and the  idea that the parents had was to help change her medications into a more  responsive ones.  A recent stressor had also been her grandfather's death,  although it was 3-4 years ago.  It was the anniversary for that death.   ADMISSION DIAGNOSES:   AXIS I:  Bipolar affective disorder, depressed.   AXIS II:  Deferred.   AXIS III:  None.   AXIS IV:  Psychosocial stressors.   AXIS V:  35 Global Assessment of Functioning.   MEDICATIONS:  Geodon 60 mg at bedtime, lithium 600 mg b.i.d., __________  q.a.m.   LABORATORY DATA:  CBC differential:  Her white blood count was on the low  side 5 __________  as was her RBC count.  Her hemoglobin was 10.9 mg/dl.  Hematocrit was 32.2%, platelet count was somewhat low at 179,000.  Otherwise, differential was almost within normal limits.  Routine chemistry  included serum sodium 139, serum potassium 3.8 mEq/L, chloride 109 mEq/L,  glucose 106 mg/dl, BUN 4 mg/dl.  The  routine chemistry for her liver profile  was again within low range of normal.  The thyroid profile was normal.  Urine pregnancy test was negative.  Toxicology testing, urine drug screen,  positive for methamphetamines and none otherwise.  Drug screen with alcohol  in the urine was negative.  Urinalysis was normal.  Test for Chlamydia in  the urine was negative.  RPR was nonreactive.  These were the only tests  that were done other than the hematology findings all were normal.   MENTAL STATUS EXAM:  Before discharge, she was a lot more stable, looked a  lot happier.  Denied any suicidal ideation, any homicidal ideation, denied  feeling depressed.  She denied any psychotic symptoms.  Cognitive abilities  were average and insight and judgment were very much improved.   HOSPITAL COURSE:  She did well during her stay.  She gradually responded  well to medication change, calmed down and interacted well both on  individual therapy and on group therapy sessions.  Medication changes were  as follows:  The patient was continued on Adderall XR 30 mg q.a.m., Geodon  160 mg q.a.m., lithium carbonate 600 mg in the a.m. and bedtime (that was on  admission).  The medication changes included, on the second day of  admission, reduction of Adderall dosage to 20 mg q.a.m.  Addition of  Strattera 40 mg capsules q.a.m.  The patient was started on Abilify 7.5 mg  at bedtime.  Geodon was reduced in dosage to 80 mg at bedtime only.  Cogentin was added at 1 mg at bedtime.  Depakote ER was started 500 mg at  bedtime and lithium carbonate was discontinued at bedtime (maintained only  in the morning).  On February 15, 2003, the second day of admission, Adderall  was reduced to 15 mg q.a.m.  Abilify was increased to 10 mg at bedtime and  Geodon was discontinued.  Remeron was added 45 mg at bedtime on February 16, 2003.  The patient was doing very well on discharge.   DISCHARGE MEDICATIONS:  1. Abilify 10 mg at bedtime.   2. Adderall XR 10 mg q.a.m.  3. Strattera 80 mg q.a.m.  4. Remeron 30 mg at bedtime.  5. Depakote ER 500 mg at bedtime.   CONDITION ON DISCHARGE:  The patient was mentally very stable and her Global  Assessment of Functioning was 51-60, moderate.   DISCHARGE PLANS:  Follow-up visit to the outpatient clinic at [facility] to see Dr. Mariana Single on February 28, 2003 at 3:30 p.m. and to  continue with Bonita Quin __________  on February 20, 2003 at 4 p.m., who is a  Paramedic.                                               Laney Pastor. Alnaquib, M.D.    NMA/MEDQ  D:  03/13/2003  T:  03/13/2003  Job:  [id number]   cc:   Bonita Quin __________
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
## 2011-06-04 ENCOUNTER — Encounter (HOSPITAL_COMMUNITY): Payer: Medicare Other | Admitting: Psychiatry

## 2011-06-04 ENCOUNTER — Encounter (INDEPENDENT_AMBULATORY_CARE_PROVIDER_SITE_OTHER): Payer: Medicare Other | Admitting: Psychiatry

## 2011-06-04 DIAGNOSIS — F309 Manic episode, unspecified: Secondary | ICD-10-CM

## 2011-06-04 DIAGNOSIS — F988 Other specified behavioral and emotional disorders with onset usually occurring in childhood and adolescence: Secondary | ICD-10-CM

## 2011-06-05 ENCOUNTER — Ambulatory Visit (INDEPENDENT_AMBULATORY_CARE_PROVIDER_SITE_OTHER): Payer: Medicare Other | Admitting: Internal Medicine

## 2011-06-05 VITALS — BP 104/73 | HR 66 | Temp 98.6°F | Wt 208.6 lb

## 2011-06-05 DIAGNOSIS — N926 Irregular menstruation, unspecified: Secondary | ICD-10-CM | POA: Insufficient documentation

## 2011-06-05 DIAGNOSIS — F319 Bipolar disorder, unspecified: Secondary | ICD-10-CM

## 2011-06-05 LAB — POCT URINE PREGNANCY: Preg Test, Ur: NEGATIVE

## 2011-06-05 NOTE — Progress Notes (Signed)
  Subjective:    Patient ID: [name], female    DOB: 10-23-1986, 25 y.o.   MRN: [id number]  HPI 26 years old female who had unprotected sex about a month ago is here in the office for pregnancy tests. She is on multiple psych meds which can affect the fetus.  She has no other symptoms   Review of Systems  All other systems reviewed and are negative.       Objective:   Physical Exam  Constitutional: She is oriented to person, place, and time. She appears well-developed and well-nourished.  HENT:  Head: Normocephalic and atraumatic.  Right Ear: External ear normal.  Left Ear: External ear normal.  Eyes: Conjunctivae and EOM are normal. Pupils are equal, round, and reactive to light.  Neck: No JVD present. No tracheal deviation present. No thyromegaly present.  Cardiovascular: Normal rate, regular rhythm and normal heart sounds.  Exam reveals no gallop.   No murmur heard. Pulmonary/Chest: No respiratory distress. She has no wheezes. She has no rales. She exhibits no tenderness.  Abdominal: Soft. Bowel sounds are normal. She exhibits no distension and no mass. There is no tenderness. There is no rebound and no guarding.  Musculoskeletal: Normal range of motion. She exhibits no edema and no tenderness.  Lymphadenopathy:    She has no cervical adenopathy.  Neurological: She is alert and oriented to person, place, and time. She has normal reflexes. No cranial nerve deficit. Coordination normal.  Skin: No rash noted. No erythema.  Psychiatric: She has a normal mood and affect. Her behavior is normal. Thought content normal.       Limited exam as patient was estatic about not being pregnant and did not interact much with me. Mother accompanied the patient and said she was doing fine.           Assessment & Plan:

## 2011-06-05 NOTE — Assessment & Plan Note (Signed)
Negative urine tests. Continue all meds per psych physician.

## 2011-06-05 NOTE — Patient Instructions (Signed)
Return as needed

## 2011-06-05 NOTE — Assessment & Plan Note (Signed)
Likely secondary to depovera. She should stay on it.

## 2011-06-23 ENCOUNTER — Encounter: Payer: Medicare Other | Admitting: Internal Medicine

## 2011-06-27 ENCOUNTER — Ambulatory Visit (INDEPENDENT_AMBULATORY_CARE_PROVIDER_SITE_OTHER): Payer: Medicare Other | Admitting: Internal Medicine

## 2011-06-27 ENCOUNTER — Encounter: Payer: Self-pay | Admitting: Internal Medicine

## 2011-06-27 VITALS — BP 100/80 | HR 86 | Temp 98.1°F | Wt 207.4 lb

## 2011-06-27 DIAGNOSIS — J209 Acute bronchitis, unspecified: Secondary | ICD-10-CM

## 2011-06-27 DIAGNOSIS — N390 Urinary tract infection, site not specified: Secondary | ICD-10-CM

## 2011-06-27 MED ORDER — METHYLPHENIDATE HCL ER (LA) 30 MG PO CP24: 30.0000 mg | ORAL_CAPSULE | ORAL | Status: DC

## 2011-06-27 MED ORDER — SULFAMETHOXAZOLE-TRIMETHOPRIM 800-160 MG PO TABS: 1.0000 | ORAL_TABLET | Freq: Two times a day (BID) | ORAL | Status: AC

## 2011-06-27 MED ORDER — GUAIFENESIN ER 600 MG PO TB12: 600.0000 mg | ORAL_TABLET | Freq: Two times a day (BID) | ORAL | Status: DC

## 2011-06-27 NOTE — Progress Notes (Signed)
  Subjective:    Patient ID: [name], female    DOB: 22-Sep-1986, 25 y.o.   MRN: [id number]  HPI This is a 25 yo young lady with PMH of Bipolar disorder, ADHD and irregular menses presents to the clinic for cough x 1wk.  Pt stated that she started cough with sneezing and rhinorrhea 1 week ago. Some mild yellowish sputum noted. Trace tinged blood noted twice with her sputum x1 day. No night sweats. Able to sleep through the night. Patient also complained of chills at times with highest temperature 99.8 F noted yesterday. Positive sore throat worsening with swallowing. Mild dyspnea with exertion.  She also reported mild nausea and vomited one time with stomach content yesterday. No diarrhea.  When asked, the patient also stated she has had dysuria, and urinary frequency and urgency for 1 week. Patient's mother was with her in the room the entire time. I could sense that patient turned hostile when asked about her urinary symptoms. Patient declined to let us check her UA and throat swab despite the thorough reassurance and explanation of these exams. This was the first time for me to meet this patient. I don't feel that we have enough rapport for her to feel comfortable to tell me about her history of urinary problems and possible sexual activities.  Allergies  Allergen Reactions  . Depakote     Pt had h/o intolerance from Depakote. Stated that she never wants this med.     Review of Systems No headache. No chest pain, chest pressure or palpitation.  No abdominal pain. No melena, diarrhea or incontinence. No muscle weakness. Denies depression. No appetite or weight changes.      Objective:   Physical Exam Flat affect, little eye contact with me.  General: alert, well-developed.  Mouth: pharynx moist, erythema noted, and no exudates.  Lungs: normal respiratory effort, no accessory muscle use, normal breath sounds, no crackles, and no wheezes. Heart: normal rate, regular rhythm, no  murmur, no gallop, and no rub.  Abdomen: soft, non-tender, normal bowel sounds, no distention, no guarding, no rebound tenderness, no hepatomegaly, and no splenomegaly.  Msk: no joint swelling, no joint warmth, and no redness over joints.  Pulses: 2+ DP/PT pulses bilaterally Extremities: No cyanosis, clubbing, edema Neurologic: alert & oriented X3, cranial nerves II-XII intact, strength normal in all extremities, sensation intact to light touch, and gait normal.  Skin: turgor normal and no rashes.  Psych: Oriented X3, memory intact for recent and remote, normally interactive,  not anxious appearing, and not depressed appearing.        Assessment & Plan:

## 2011-06-27 NOTE — Assessment & Plan Note (Signed)
C/o dysuria, urinary frequency and urgency x1 week. No CVA tenderness. Patient declined to give [place] a urine sample for urine analysis despite the reassurance and explanation as to why we are checking it..   Patient's mother was with the patient in the room the entire time.  I got the sense that the patient was hostile when asked about signs and  symptoms urinary system and any sexual activities.  This was my first time seeing this pt.    I do not think that we have enough rapport for her to feel comfortable and tell me more about her history. - will cover her ? UTI with Bactrim DS that I prescribed for her bronchitis. - The pt and her mother left the clinic before I was able to give them AVS. They will be called by the clinic nurse for the instructions and follow up visit.

## 2011-06-27 NOTE — Assessment & Plan Note (Signed)
Cough with sputum x 1 week. The entire family is sick with the similar symptoms. Pt also c/o sore throat. Declined the throat swab for strep throat.  Given her h/o recurrent strep throat. Will cover with ABX - bactrim DS 1 tab po BID x 10 days -Mucinex 600 mg po BID x 2 weeks

## 2011-06-27 NOTE — Patient Instructions (Signed)
1. Will prescribe you the following medications   Bactrim DS 1 tab oral twice a day for 10 days.   Mucinex 600 mg Oral 1 tab twice a day for 2 weeks. 2. Please report to the clinic with any worsening of symptoms. 3. Follow up with the clinic in 3 weeks.

## 2011-07-09 ENCOUNTER — Encounter: Payer: Medicare Other | Admitting: Internal Medicine

## 2011-07-16 ENCOUNTER — Encounter (INDEPENDENT_AMBULATORY_CARE_PROVIDER_SITE_OTHER): Payer: Medicare Other | Admitting: Psychiatry

## 2011-07-16 DIAGNOSIS — F3189 Other bipolar disorder: Secondary | ICD-10-CM

## 2011-07-17 NOTE — Progress Notes (Signed)
[name], [name]            ACCOUNT NO.:  [id number]  MEDICAL RECORD NO.:  [id number]  LOCATION:  [facility]                           FACILITY:  [facility]  PHYSICIAN:  Syed T. Arfeen, M.D.   DATE OF BIRTH:  [birth year]                                PROGRESS NOTE  Date; 07/16/11 HISTORY OF PRESENT ILLNESS: The patient came in today for her follow-up appointment with her mother. On her last visit, she thought that she was pregnant.  We had decided to stop the medication; however, later she called that her test was negative, and she would like to resume her medication.  Per the patient and her mother, the patient has increased mood swings, anger and agitation.  She admitted to a lot of stress as she is going through a difficult relationship with her boyfriend.  She also admitted that she has not seen her therapist in the past few weeks.  She reported poor sleep, anger, being very impulsive, agitated and angry.  There are times when the mother stated that she has been yelling and cursing but so farno violent behavior.  PHYSICAL EXAMINATION: VITAL SIGNS:  Her height is 5 feet, 5 inches, weight 203 pounds which has actually dropped from last visit when she was weighing 207.  Her BMI is 34.  Her blood pressure is 127/83.  Her heart rate is 71.  CURRENT MEDICATIONS: 1. She is taking Lamictal 200 mg daily. 2. Abilify 20 mg daily. 3. Ritalin LA 30 mg daily. 4. Trazodone 300 mg daily.  MENTAL STATUS EXAM: The patient is casually dressed, easily irritable and angry with poor eye contact though she denies any auditory hallucinations, suicidal thoughts or homicidal thoughts.  She is difficult to engage in conversation with poor attention and poor concentration.  Her thought process was circumstantial.  She was easily irritable when talking about the relationship with her boyfriend.  However, there was no psychosis, obsession, delusion present.  Insight and judgment fair.  DIAGNOSES: Axis  I: 1. Bipolar disorder NOS. 2. ADD by history. Axis II:  Deferred. Axis III:  See medical history including obesity. Axis IV:  Mild to moderate.  PLAN: I talked with the patient and her mother at length.  I have noticed more moodiness and anger since we have increased the Ritalin to 30 mg.  We will try to reduce her Ritalin to 20 mg to see if that can help her moodiness.  Also, despite taking 300 mg of trazodone, she has been not sleeping very well.  We will try a small dose of Klonopin 0.5 mg, and if she cannot sleep with the Klonopin, then she can take trazodone 100 mg. I will also increase her Lamictal 150 mg to twice a day.  I explained the risks and benefits of medication in detail including if a rash happened that she needs to call immediately.  I also encouraged her to see a therapist for increased coping and social skills.  I will see her again in 3 weeks.     Syed T. Lolly Mustache, M.D.     STA/MEDQ  D:  07/16/2011  T:  07/16/2011  Job:  [id number]  Electronically Signed by Kathryne Sharper M.D. on  07/17/2011 02:25:49 PM

## 2011-08-06 ENCOUNTER — Encounter (INDEPENDENT_AMBULATORY_CARE_PROVIDER_SITE_OTHER): Payer: Medicare Other | Admitting: Psychiatry

## 2011-08-06 DIAGNOSIS — F39 Unspecified mood [affective] disorder: Secondary | ICD-10-CM

## 2011-09-03 ENCOUNTER — Encounter (INDEPENDENT_AMBULATORY_CARE_PROVIDER_SITE_OTHER): Payer: Medicare Other | Admitting: Psychiatry

## 2011-09-03 DIAGNOSIS — F988 Other specified behavioral and emotional disorders with onset usually occurring in childhood and adolescence: Secondary | ICD-10-CM

## 2011-09-03 DIAGNOSIS — F3189 Other bipolar disorder: Secondary | ICD-10-CM

## 2011-09-12 ENCOUNTER — Inpatient Hospital Stay (INDEPENDENT_AMBULATORY_CARE_PROVIDER_SITE_OTHER)
Admission: RE | Admit: 2011-09-12 | Discharge: 2011-09-12 | Disposition: A | Discharge status: Home / Self Care | Payer: Medicare Other | Source: Ambulatory Visit | Attending: Family Medicine | Admitting: Family Medicine

## 2011-09-12 DIAGNOSIS — B9789 Other viral agents as the cause of diseases classified elsewhere: Secondary | ICD-10-CM

## 2011-09-12 DIAGNOSIS — R197 Diarrhea, unspecified: Secondary | ICD-10-CM

## 2011-09-15 LAB — URINALYSIS, ROUTINE W REFLEX MICROSCOPIC
Bilirubin Urine: NEGATIVE
Glucose, UA: NEGATIVE
Hgb urine dipstick: NEGATIVE
Nitrite: NEGATIVE
Protein, ur: NEGATIVE
Specific Gravity, Urine: 1.031 — ABNORMAL HIGH
Urobilinogen, UA: 2 — ABNORMAL HIGH
pH: 6

## 2011-09-15 LAB — PREGNANCY, URINE: Preg Test, Ur: NEGATIVE

## 2011-09-15 LAB — COMPREHENSIVE METABOLIC PANEL
ALT: 19
AST: 32
Albumin: 3.2 — ABNORMAL LOW
Alkaline Phosphatase: 56
BUN: 11
CO2: 28
Calcium: 9.5
Chloride: 105
Creatinine, Ser: 0.86
GFR calc Af Amer: >60
GFR calc non Af Amer: >60
Glucose, Bld: 86
Potassium: 4.1
Sodium: 138
Total Bilirubin: 0.6
Total Protein: 6.6

## 2011-09-15 LAB — CBC
HCT: 39.1
HCT: 43.1
Hemoglobin: 13.2
Hemoglobin: 14.5
MCHC: 33.6
MCHC: 33.8
MCV: 101.2 — ABNORMAL HIGH
MCV: 101.9 — ABNORMAL HIGH
Platelets: 120 — ABNORMAL LOW
Platelets: 133 — ABNORMAL LOW
RBC: 3.87
RBC: 4.22
RDW: 13.3
RDW: 13.4
WBC: 5.8
WBC: 6.2

## 2011-09-15 LAB — VALPROIC ACID LEVEL: Valproic Acid Lvl: 89

## 2011-09-15 LAB — URINE DRUGS OF ABUSE SCREEN W ALC, ROUTINE (REF LAB)
Amphetamine Screen, Ur: NEGATIVE
Barbiturate Quant, Ur: NEGATIVE
Benzodiazepines.: NEGATIVE
Cocaine Metabolites: NEGATIVE
Creatinine,U: 251.9
Ethyl Alcohol: <10
Marijuana Metabolite: NEGATIVE
Methadone: NEGATIVE
Opiate Screen, Urine: NEGATIVE
Phencyclidine (PCP): NEGATIVE
Propoxyphene: NEGATIVE

## 2011-09-15 LAB — TSH: TSH: 4.071

## 2011-09-23 LAB — POCT RAPID STREP A: Streptococcus, Group A Screen (Direct): NEGATIVE

## 2011-09-25 LAB — DRUGS OF ABUSE SCREEN W/O ALC, ROUTINE URINE
Amphetamine Screen, Ur: NEGATIVE
Barbiturate Quant, Ur: NEGATIVE
Benzodiazepines.: NEGATIVE
Cocaine Metabolites: NEGATIVE
Creatinine,U: 110.4
Marijuana Metabolite: NEGATIVE
Methadone: NEGATIVE
Opiate Screen, Urine: NEGATIVE
Phencyclidine (PCP): NEGATIVE
Propoxyphene: NEGATIVE

## 2011-09-25 LAB — CBC
HCT: 35.7 — ABNORMAL LOW
Hemoglobin: 12.3
MCHC: 34.5
MCV: 95.9
Platelets: 145 — ABNORMAL LOW
RBC: 3.72 — ABNORMAL LOW
RDW: 13.9
WBC: 4.8

## 2011-09-25 LAB — COMPREHENSIVE METABOLIC PANEL
ALT: 10
AST: 14
Albumin: 2.9 — ABNORMAL LOW
Alkaline Phosphatase: 66
BUN: 16
CO2: 27
Calcium: 9.2
Chloride: 103
Creatinine, Ser: 0.93
GFR calc Af Amer: >60
GFR calc non Af Amer: >60
Glucose, Bld: 90
Potassium: 4.1
Sodium: 136
Total Bilirubin: 0.6
Total Protein: 5.5 — ABNORMAL LOW

## 2011-09-25 LAB — URINALYSIS, ROUTINE W REFLEX MICROSCOPIC
Bilirubin Urine: NEGATIVE
Glucose, UA: NEGATIVE
Hgb urine dipstick: NEGATIVE
Ketones, ur: NEGATIVE
Nitrite: NEGATIVE
Protein, ur: NEGATIVE
Specific Gravity, Urine: 1.023
Urobilinogen, UA: 0.2
pH: 6

## 2011-09-25 LAB — VALPROIC ACID LEVEL: Valproic Acid Lvl: 87.5

## 2011-09-25 LAB — TSH: TSH: 1.07

## 2011-09-25 LAB — URINE MICROSCOPIC-ADD ON

## 2011-09-25 LAB — PREGNANCY, URINE: Preg Test, Ur: NEGATIVE

## 2011-10-15 ENCOUNTER — Encounter (INDEPENDENT_AMBULATORY_CARE_PROVIDER_SITE_OTHER): Payer: Medicare Other | Admitting: Psychiatry

## 2011-10-15 DIAGNOSIS — F3189 Other bipolar disorder: Secondary | ICD-10-CM

## 2011-11-21 ENCOUNTER — Other Ambulatory Visit (HOSPITAL_COMMUNITY): Payer: Self-pay | Admitting: Physician Assistant

## 2011-11-21 DIAGNOSIS — F988 Other specified behavioral and emotional disorders with onset usually occurring in childhood and adolescence: Secondary | ICD-10-CM

## 2011-11-21 MED ORDER — METHYLPHENIDATE HCL ER (LA) 20 MG PO CP24: 20.0000 mg | ORAL_CAPSULE | ORAL | Status: DC

## 2011-12-12 ENCOUNTER — Encounter (HOSPITAL_COMMUNITY): Payer: Self-pay

## 2011-12-12 ENCOUNTER — Emergency Department (INDEPENDENT_AMBULATORY_CARE_PROVIDER_SITE_OTHER)
Admission: EM | Admit: 2011-12-12 | Discharge: 2011-12-12 | Disposition: A | Discharge status: Home / Self Care | Payer: Medicare Other | Source: Home / Self Care | Attending: Emergency Medicine | Admitting: Emergency Medicine

## 2011-12-12 ENCOUNTER — Emergency Department (INDEPENDENT_AMBULATORY_CARE_PROVIDER_SITE_OTHER): Payer: Medicare Other

## 2011-12-12 DIAGNOSIS — K044 Acute apical periodontitis of pulpal origin: Secondary | ICD-10-CM

## 2011-12-12 DIAGNOSIS — R42 Dizziness and giddiness: Secondary | ICD-10-CM

## 2011-12-12 DIAGNOSIS — R51 Headache: Secondary | ICD-10-CM

## 2011-12-12 DIAGNOSIS — K047 Periapical abscess without sinus: Secondary | ICD-10-CM

## 2011-12-12 DIAGNOSIS — R109 Unspecified abdominal pain: Secondary | ICD-10-CM

## 2011-12-12 DIAGNOSIS — R042 Hemoptysis: Secondary | ICD-10-CM

## 2011-12-12 HISTORY — DX: Attention-deficit hyperactivity disorder, unspecified type: F90.9

## 2011-12-12 HISTORY — DX: Bipolar disorder, unspecified: F31.9

## 2011-12-12 MED ORDER — HYDROCODONE-ACETAMINOPHEN 5-325 MG PO TABS: ORAL_TABLET | ORAL | Status: AC

## 2011-12-12 MED ORDER — AMOXICILLIN 500 MG PO CAPS: 1000.0000 mg | ORAL_CAPSULE | Freq: Three times a day (TID) | ORAL | Status: AC

## 2011-12-12 NOTE — Discharge Instructions (Signed)
Look up the [facility] [facility] [employer] for free dental clinics. Http://www.ncdental.org/ncds/Schedule.asp ° °Get there early and be prepared to wait. [employer] and [employer] have dental hygienist schools that provide low cost routine dental care.  ° °Other resources: °[facility] °[address] °[place], [place] °([phone] ° °Patients with Medicaid: °[facility]                     [facility] °[address]                                [address] °Phone:  [phone]                                                  Phone:  [phone] ° °If unable to pay or uninsured, contact:  [facility] or [facility] to become qualified for the adult dental clinic. ° °No matter what dental problem you have, it will not get better unless you get good dental care.  If the tooth is not taken care of, your symptoms will come back in time and you will be visiting us again in the Urgent Care Center with a bad toothache.  So, see your dentist as soon as possible.  If you don't have a dentist, we can give you a list of dentists.  Sometimes the most cost effective treatment is removal of the tooth.  This can be done very inexpensively through one of the low cost [facility] Centers such as the facility on [address] in [place] (1-800-336-8873).  The downside to this is that you will have one less tooth and this can effect your ability to chew. ° °Some other things that can be done for a dental infection include the following: ° °· Rinse your mouth out with hot salt water (1/2 tsp of table salt and a pinch of baking soda in 8 oz of hot water).  You can do this every 2 or 3 hours. °· Avoid cold foods, beverages, and cold air.  This will make your symptoms worse. °· Sleep with your head elevated.  Sleeping flat will cause your gums and oral tissues to swell and make them hurt more.  You can sleep on several pillows.  Even  better is to sleep in a recliner with your head higher than your heart. °· For mild to moderate pain, you can take Tylenol, ibuprofen, or Aleve. °· External application of heat by a heating pad, hot water bottle, or hot wet towel can help with pain and speed healing.  You can do this every 2 to 3 hours. Do not fall asleep on a heating pad since this can cause a burn.  °·  °

## 2011-12-12 NOTE — ED Notes (Signed)
C/o pain in teeth (lt upper) for weeks.  States she saw oral surgeon on 12/ 18 and he has scheduled her to have affected teeth removed.  States she is here today because feeling dizzy and lightheaded.  Taking penicillin and hydrocodone per the oral surgeon.

## 2011-12-12 NOTE — ED Provider Notes (Signed)
History     CSN: [id number]  Arrival date & time 12/12/11  1955   First MD Initiated Contact with Patient 12/12/11 1956      Chief Complaint  Patient presents with  . Dental Pain  . Dizziness    (Consider location/radiation/quality/duration/timing/severity/associated sxs/prior treatment) HPI Comments: [name] has had a two-week history of pain in multiple teeth both upper and lower, right and left and she is due to see an oral surgeon next week. She has swelling internally and externally. She has pain with chewing but no trouble swallowing or breathing and today she had cough and coughed up a small amount of blood. She felt somewhat chilled. She denies any fever. No shortness of breath, sweats, weight loss. She also has had a two-day history of feeling dizzy and lightheaded. It feels like her head is spinning. This comes and goes and is non-positional. She denies any ear congestion, ear pain, difficulty hearing, ringing in the ears. She also has had a two-day history of mid abdominal pain and she vomited once. There was no blood in the vomitus. She denies diarrhea or urinary symptoms. She has had no GYN complaints. Her last menstrual period was about 2 days ago. Today she has a headache on the left side in the front. She denies any diplopia, blurred vision, or neurological complaints.  Patient is a 25 y.o. female presenting with tooth pain.  Dental PainThe primary symptoms include headaches and cough. Primary symptoms do not include fever, shortness of breath or sore throat.  The headache is not associated with weakness.  Additional symptoms do not include: ear pain and fatigue.    Past Medical History  Diagnosis Date  . Bipolar disorder   . Attention deficit hyperactivity disorder     History reviewed. No pertinent past surgical history.  History reviewed. No pertinent family history.  History  Substance Use Topics  . Smoking status: Never Smoker   . Smokeless tobacco: Not on  file  . Alcohol Use: No    OB History    Grav Para Term Preterm Abortions TAB SAB Ect Mult Living                  Review of Systems  Constitutional: Negative for fever, chills, fatigue and unexpected weight change.  HENT: Negative for ear pain, congestion, sore throat and rhinorrhea.   Eyes: Negative for redness and visual disturbance.  Respiratory: Positive for cough. Negative for shortness of breath and wheezing.   Cardiovascular: Negative for chest pain and palpitations.  Gastrointestinal: Positive for nausea, vomiting and abdominal pain. Negative for diarrhea and constipation.  Genitourinary: Negative for dysuria, urgency and frequency.  Skin: Negative for rash.  Neurological: Positive for dizziness and headaches. Negative for facial asymmetry, speech difficulty, weakness and numbness.    Allergies  Depakote  Home Medications   Current Outpatient Rx  Name Route Sig Dispense Refill  . ARIPIPRAZOLE 20 MG PO TABS Oral Take 20 mg by mouth daily.      [name] HYDROCODONE-ACETAMINOPHEN 10-325 MG PO TABS Oral Take 1 tablet by mouth every 6 (six) hours as needed.      [name] LAMOTRIGINE 200 MG PO TABS Oral Take 150 mg by mouth 2 (two) times daily.     . METHYLPHENIDATE HCL ER (LA) 20 MG PO CP24 Oral Take 1 capsule (20 mg total) by mouth every morning. 30 capsule 0  . PENICILLIN V POTASSIUM 500 MG PO TABS Oral Take 500 mg by mouth 2 (two) times daily.      [name]  TRAZODONE HCL 100 MG PO TABS Oral Take 300 mg by mouth at bedtime.     . AMOXICILLIN 500 MG PO CAPS Oral Take 2 capsules (1,000 mg total) by mouth 3 (three) times daily. 60 capsule 0  . GUAIFENESIN ER 600 MG PO TB12 Oral Take 1 tablet (600 mg total) by mouth 2 (two) times daily. 60 tablet 2  . HYDROCODONE-ACETAMINOPHEN 5-325 MG PO TABS  1 to 2 tabs every 4 to 6 hours as needed for pain. 20 tablet 0  . METHYLPHENIDATE HCL ER (LA) 30 MG PO CP24 Oral Take 1 capsule (30 mg total) by mouth every morning. 30 capsule 0    BP 143/86  Pulse 94   Temp(Src) 98.2 F (36.8 C) (Oral)  Resp 18  SpO2 98%  LMP 12/10/2011  Physical Exam  Nursing note and vitals reviewed. Constitutional: She is oriented to person, place, and time. She appears well-developed and well-nourished. No distress.  HENT:  Head: Normocephalic and atraumatic.  Mouth/Throat: Oropharynx is clear and moist.       She has multiple carious teeth. There is no swelling inside the mouth on the outside. No swelling of the floor the mouth. The pharynx is clear.  Eyes: Conjunctivae and EOM are normal. Pupils are equal, round, and reactive to light. No scleral icterus.  Neck: Normal range of motion. Neck supple.  Cardiovascular: Normal rate, regular rhythm and normal heart sounds.  Exam reveals no gallop and no friction rub.   No murmur heard. Pulmonary/Chest: Effort normal and breath sounds normal. No respiratory distress. She has no wheezes. She has no rales.  Abdominal: Soft. Bowel sounds are normal. She exhibits no distension and no mass. There is tenderness (there is mild generalized tenderness to palpation without guarding or rebound. Bowel sounds are normal.). There is no rebound and no guarding.  Lymphadenopathy:    She has no cervical adenopathy.  Neurological: She is alert and oriented to person, place, and time.  Skin: Skin is warm and dry. No rash noted. She is not diaphoretic.    ED Course  Procedures (including critical care time)  Labs Reviewed - No data to display Dg Chest 2 View  12/12/2011  *RADIOLOGY REPORT*  Clinical Data: Chronic cough.  Hemoptysis today.  CHEST - 2 VIEW 12/12/2011:  Comparison: Two-view chest x-ray 09/06/2004 [facility].  Findings: Cardiomediastinal silhouette unremarkable.  Lungs clear. Bronchovascular markings normal.  Pulmonary vascularity normal.  No pleural effusions.  No pneumothorax.  Visualized bony thorax intact.  IMPRESSION: Normal examination.  Original Report Authenticated By: Arnell Sieving, M.D.     1.  Dental infection   2. Hemoptysis   3. Headache   4. Abdominal  pain, other specified site   5. Dizziness       MDM  She has multiple seemingly unrelated complaints. She does have several very decayed teeth. I'm not sure the cause of her other symptoms including hemoptysis, dizziness, headache, or abdominal pain. Chest x-ray was normal. I will go ahead and treat with amoxicillin and hydrocodone for the pain. She was urged to see her dentist as soon as possible, and to followup with her primary care physician if her other symptoms have not resolved by next week.        Roque Lias, MD 12/12/11 2126

## 2012-01-11 ENCOUNTER — Encounter (HOSPITAL_COMMUNITY): Payer: Self-pay | Admitting: Emergency Medicine

## 2012-01-11 ENCOUNTER — Emergency Department (HOSPITAL_COMMUNITY)
Admission: EM | Admit: 2012-01-11 | Discharge: 2012-01-12 | Disposition: A | Discharge status: Home / Self Care | Payer: Medicare Other | Attending: Emergency Medicine | Admitting: Emergency Medicine

## 2012-01-11 DIAGNOSIS — S51809A Unspecified open wound of unspecified forearm, initial encounter: Secondary | ICD-10-CM | POA: Insufficient documentation

## 2012-01-11 DIAGNOSIS — S61509A Unspecified open wound of unspecified wrist, initial encounter: Secondary | ICD-10-CM | POA: Insufficient documentation

## 2012-01-11 DIAGNOSIS — F329 Major depressive disorder, single episode, unspecified: Secondary | ICD-10-CM | POA: Insufficient documentation

## 2012-01-11 DIAGNOSIS — F3289 Other specified depressive episodes: Secondary | ICD-10-CM | POA: Insufficient documentation

## 2012-01-11 DIAGNOSIS — F489 Nonpsychotic mental disorder, unspecified: Secondary | ICD-10-CM | POA: Insufficient documentation

## 2012-01-11 DIAGNOSIS — Z7289 Other problems related to lifestyle: Secondary | ICD-10-CM

## 2012-01-11 DIAGNOSIS — X789XXA Intentional self-harm by unspecified sharp object, initial encounter: Secondary | ICD-10-CM | POA: Insufficient documentation

## 2012-01-11 DIAGNOSIS — IMO0002 Reserved for concepts with insufficient information to code with codable children: Secondary | ICD-10-CM

## 2012-01-11 HISTORY — DX: Major depressive disorder, single episode, unspecified: F32.9

## 2012-01-11 HISTORY — DX: Depression, unspecified: F32.A

## 2012-01-11 LAB — CBC
HCT: 39.8 % (ref 36.0–46.0)
Hemoglobin: 13 g/dL (ref 12.0–15.0)
MCH: 30.2 pg (ref 26.0–34.0)
MCHC: 32.7 g/dL (ref 30.0–36.0)
MCV: 92.3 fL (ref 78.0–100.0)
Platelets: 204 10*3/uL (ref 150–400)
RBC: 4.31 MIL/uL (ref 3.87–5.11)
RDW: 12.6 % (ref 11.5–15.5)
WBC: 8.6 10*3/uL (ref 4.0–10.5)

## 2012-01-11 LAB — COMPREHENSIVE METABOLIC PANEL
ALT: 11 U/L (ref 0–35)
AST: 16 U/L (ref 0–37)
Albumin: 3.9 g/dL (ref 3.5–5.2)
Alkaline Phosphatase: 83 U/L (ref 39–117)
BUN: 9 mg/dL (ref 6–23)
CO2: 26 mEq/L (ref 19–32)
Calcium: 9.3 mg/dL (ref 8.4–10.5)
Chloride: 102 mEq/L (ref 96–112)
Creatinine, Ser: 1.04 mg/dL (ref 0.50–1.10)
GFR calc Af Amer: 86 mL/min — ABNORMAL LOW (ref >90)
GFR calc non Af Amer: 75 mL/min — ABNORMAL LOW (ref >90)
Glucose, Bld: 74 mg/dL (ref 70–99)
Potassium: 3.9 mEq/L (ref 3.5–5.1)
Sodium: 137 mEq/L (ref 135–145)
Total Bilirubin: 0.1 mg/dL — ABNORMAL LOW (ref 0.3–1.2)
Total Protein: 7.6 g/dL (ref 6.0–8.3)

## 2012-01-11 LAB — URINALYSIS, ROUTINE W REFLEX MICROSCOPIC
Bilirubin Urine: NEGATIVE
Glucose, UA: NEGATIVE mg/dL
Ketones, ur: NEGATIVE mg/dL
Leukocytes, UA: NEGATIVE
Nitrite: NEGATIVE
Protein, ur: NEGATIVE mg/dL
Specific Gravity, Urine: 1.034 — ABNORMAL HIGH (ref 1.005–1.030)
Urobilinogen, UA: 0.2 mg/dL (ref 0.0–1.0)
pH: 5.5 (ref 5.0–8.0)

## 2012-01-11 LAB — URINE MICROSCOPIC-ADD ON

## 2012-01-11 LAB — ETHANOL: Alcohol, Ethyl (B): <11 mg/dL (ref 0–11)

## 2012-01-11 LAB — POCT PREGNANCY, URINE: Preg Test, Ur: NEGATIVE

## 2012-01-11 MED ORDER — BACITRACIN ZINC 500 UNIT/GM EX OINT
1.0000 "application " | TOPICAL_OINTMENT | Freq: Once | CUTANEOUS | Status: AC
  Administered 2012-01-11: 1 via TOPICAL

## 2012-01-11 MED ORDER — BACITRACIN ZINC 500 UNIT/GM EX OINT
TOPICAL_OINTMENT | CUTANEOUS | Status: AC
  Administered 2012-01-11: 1 via TOPICAL
  Filled 2012-01-11: qty 0.9

## 2012-01-11 MED ORDER — TETANUS-DIPHTH-ACELL PERTUSSIS 5-2.5-18.5 LF-MCG/0.5 IM SUSP
0.5000 mL | Freq: Once | INTRAMUSCULAR | Status: AC
  Administered 2012-01-11: 0.5 mL via INTRAMUSCULAR
  Filled 2012-01-11: qty 0.5

## 2012-01-11 NOTE — ED Provider Notes (Signed)
History     CSN: [id number]  Arrival date & time 01/11/12  2049   First MD Initiated Contact with Patient 01/11/12 2206      Chief Complaint  Patient presents with  . Extremity Laceration    left arm/hand    (Consider location/radiation/quality/duration/timing/severity/associated sxs/prior treatment) HPI Comments: Patient brought in to the Emergency Department by her mother.  Patient reports that earlier today she cut herself on the dorsal left forearm.  She reports that she was not trying to kill herself.  She said that she was just feeling angry at the time.  Patient has a history of Bipolar.  She is currently taking Abilify, Trazadone, and Lamictal.  Her psychiatrist is Dr. Lolly Mustache.  She denies any current thoughts of hurting herself or anyone else.  She denies any alcohol or recreational drug use.  She reports that she has had prior suicide attempts.  She reports taking any over dose of medication 5-6 years ago.  The history is provided by the patient.    Past Medical History  Diagnosis Date  . Bipolar disorder   . Attention deficit hyperactivity disorder   . Depression   . Asthma     Past Surgical History  Procedure Date  . Mouth surgery     History reviewed. No pertinent family history.  History  Substance Use Topics  . Smoking status: Never Smoker   . Smokeless tobacco: Not on file  . Alcohol Use: No    OB History    Grav Para Term Preterm Abortions TAB SAB Ect Mult Living                  Review of Systems  Constitutional: Negative for fever and chills.  Neurological: Negative for dizziness and numbness.  Psychiatric/Behavioral: Positive for self-injury. Negative for hallucinations, behavioral problems, confusion, sleep disturbance, dysphoric mood, decreased concentration and agitation. The patient is not nervous/anxious.     Allergies  Depakote  Home Medications   Current Outpatient Rx  Name Route Sig Dispense Refill  . ARIPIPRAZOLE 20 MG PO TABS  Oral Take 20 mg by mouth daily.      [name] LAMOTRIGINE 200 MG PO TABS Oral Take 150 mg by mouth 2 (two) times daily.     . METHYLPHENIDATE HCL ER (LA) 20 MG PO CP24 Oral Take 20 mg by mouth every morning.    . TRAZODONE HCL 100 MG PO TABS Oral Take 300 mg by mouth at bedtime.     . METHYLPHENIDATE HCL ER (LA) 20 MG PO CP24 Oral Take 1 capsule (20 mg total) by mouth every morning. 30 capsule 0  . METHYLPHENIDATE HCL ER (LA) 30 MG PO CP24 Oral Take 1 capsule (30 mg total) by mouth every morning. 30 capsule 0    BP 113/68  Pulse 95  Temp(Src) 98.9 F (37.2 C) (Oral)  Resp 16  SpO2 99%  LMP 12/09/2011  Physical Exam  Nursing note and vitals reviewed. Constitutional: She is oriented to person, place, and time. She appears well-developed and well-nourished.  Neck: Normal range of motion. Neck supple.  Cardiovascular: Normal rate, regular rhythm and normal heart sounds.   Pulmonary/Chest: Effort normal and breath sounds normal.  Neurological: She is alert and oriented to person, place, and time.  Skin: Skin is warm and dry.     Psychiatric: Her behavior is normal. Her speech is rapid and/or pressured. Cognition and memory are normal. She expresses impulsivity. She exhibits a depressed mood. She expresses no homicidal and  no suicidal ideation. She expresses no suicidal plans and no homicidal plans.    ED Course  Procedures (including critical care time)   Labs Reviewed  CBC  COMPREHENSIVE METABOLIC PANEL  POCT PREGNANCY, URINE  URINALYSIS, ROUTINE W REFLEX MICROSCOPIC  URINE RAPID DRUG SCREEN (HOSP PERFORMED)  ETHANOL   No results found.   No diagnosis found.  Patient given tetanus while in the ED.  1:24 AM Currently awaiting telepsych consult.  Patient discussed with Ivonne Andrew, PA-C who will follow up with patient.  MDM  Patient with self inflicted superficial lacerations of the dorsum of the left forearm.  Patient denies any current SI or HI.  Patient discussed with Dr.  Radford Pax who recommended having a telepsych consult.          Pascal Lux Camak, PA-C 01/12/12 [phone]

## 2012-01-11 NOTE — ED Notes (Signed)
Locked 2 bags of pt belongings in room  Pt and bags have been wanded
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
## 2012-01-11 NOTE — ED Notes (Signed)
Pt presented to the ER with lacerations to the left hand, pt states that "I was mad" pt further explains that this was not SI attempt, however, states that she is battelling with depression. Pt's mother states that around 1900 pt self inflicted injury to her left hand, at this time no active bleeding. Noted two lacerations 2 cm and 3 cm in length.

## 2012-01-12 ENCOUNTER — Encounter (HOSPITAL_COMMUNITY): Payer: Self-pay | Admitting: Psychiatry

## 2012-01-12 ENCOUNTER — Ambulatory Visit (INDEPENDENT_AMBULATORY_CARE_PROVIDER_SITE_OTHER): Payer: Medicare Other | Admitting: Psychiatry

## 2012-01-12 VITALS — Wt 219.0 lb

## 2012-01-12 DIAGNOSIS — F988 Other specified behavioral and emotional disorders with onset usually occurring in childhood and adolescence: Secondary | ICD-10-CM

## 2012-01-12 DIAGNOSIS — F319 Bipolar disorder, unspecified: Secondary | ICD-10-CM

## 2012-01-12 LAB — RAPID URINE DRUG SCREEN, HOSP PERFORMED
Amphetamines: POSITIVE — AB
Barbiturates: NOT DETECTED
Benzodiazepines: NOT DETECTED
Cocaine: NOT DETECTED
Opiates: NOT DETECTED
Tetrahydrocannabinol: NOT DETECTED

## 2012-01-12 MED ORDER — TRAZODONE HCL 100 MG PO TABS
300.0000 mg | ORAL_TABLET | Freq: Every day | ORAL | Status: DC
  Administered 2012-01-12: 300 mg via ORAL
  Filled 2012-01-12: qty 2
  Filled 2012-01-12: qty 1

## 2012-01-12 MED ORDER — ARIPIPRAZOLE 10 MG PO TABS: 20.0000 mg | ORAL_TABLET | Freq: Every day | ORAL | Status: DC

## 2012-01-12 MED ORDER — ARIPIPRAZOLE 30 MG PO TABS: 30.0000 mg | ORAL_TABLET | Freq: Every day | ORAL | Status: DC

## 2012-01-12 MED ORDER — METHYLPHENIDATE HCL ER (LA) 10 MG PO CP24: 10.0000 mg | ORAL_CAPSULE | Freq: Every day | ORAL | Status: DC

## 2012-01-12 MED ORDER — LAMOTRIGINE 150 MG PO TABS: 150.0000 mg | ORAL_TABLET | Freq: Two times a day (BID) | ORAL | Status: DC

## 2012-01-12 MED ORDER — LAMOTRIGINE 150 MG PO TABS
150.0000 mg | ORAL_TABLET | Freq: Two times a day (BID) | ORAL | Status: DC
  Administered 2012-01-12: 150 mg via ORAL
  Filled 2012-01-12 (×3): qty 1
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
## 2012-01-12 NOTE — ED Provider Notes (Signed)
Medical screening examination/treatment/procedure(s) were conducted as a shared visit with non-physician practitioner(s) and myself.  I personally evaluated the patient during the encounter  Pt was assessed by Dr Debbora Presto.  Pt is cleared for discharge from a psychiatric standpoint.  Pt regrets the impulsive behavior.  Pt denies SI  Celene Kras, MD 01/12/12 [phone]

## 2012-01-12 NOTE — Discharge Instructions (Signed)
Laceration Care, Adult A laceration is a cut that goes through all layers of the skin. The cut goes into the tissue beneath the skin. HOME CARE For stitches (sutures) or staples:  Keep the cut clean and dry.   If you have a bandage (dressing), change it at least once a day. Change the bandage if it gets wet or dirty, or as told by your doctor.   Wash the cut with soap and water 2 times a day. Rinse the cut with water. Pat it dry with a clean towel.   Put a thin layer of medicated cream on the cut as told by your doctor.   You may shower after the first 24 hours. Do not soak the cut in water until the stitches are removed.   Only take medicines as told by your doctor.   Have your stitches or staples removed as told by your doctor.  For skin adhesive strips:  Keep the cut clean and dry.   Do not get the strips wet. You may take a bath, but be careful to keep the cut dry.   If the cut gets wet, pat it dry with a clean towel.   The strips will fall off on their own. Do not remove the strips that are still stuck to the cut.  For wound glue:  You may shower or take baths. Do not soak or scrub the cut. Do not swim. Avoid heavy sweating until the glue falls off on its own. After a shower or bath, pat the cut dry with a clean towel.   Do not put medicine on your cut until the glue falls off.   If you have a bandage, do not put tape over the glue.   Avoid lots of sunlight or tanning lamps until the glue falls off. Put sunscreen on the cut for the first year to reduce your scar.   The glue will fall off on its own. Do not pick at the glue.  You may need a tetanus shot if:  You cannot remember when you had your last tetanus shot.   You have never had a tetanus shot.  If you need a tetanus shot and you choose not to have one, you may get tetanus. Sickness from tetanus can be serious. GET HELP RIGHT AWAY IF:   Your pain does not get better with medicine.   Your arm, hand, leg, or  foot loses feeling (numbness) or changes color.   Your cut is bleeding.   Your joint feels weak, or you cannot use your joint.   You have painful lumps on your body.   Your cut is red, puffy (swollen), or painful.   You have a red line on the skin near the cut.   You have yellowish-white fluid (pus) coming from the cut.   You have a fever.   You have a bad smell coming from the cut or bandage.   Your cut breaks open before or after stitches are removed.   You notice something coming out of the cut, such as wood or glass.   You cannot move a finger or toe.  MAKE SURE YOU:   Understand these instructions.   Will watch your condition.   Will get help right away if you are not doing well or get worse.  Document Released: 05/19/2008 Document Revised: 08/13/2011 Document Reviewed: 05/27/2011 [facility] Patient Information 2012 [place], [place].Suicidal Feelings, How to Help Yourself Everyone feels sad or unhappy at times, but depressing  thoughts and feelings of hopelessness can lead to thoughts of suicide. It can seem as if life is too tough to handle. It is as if the mountain is just too high and your climbing skills are not great enough. At that moment these dark thoughts and feelings may seem overwhelming and never ending. It is important to remember these feelings are temporary! They will go away. If you feel as though you have reached the point where suicide is the only answer, it is time to let someone know immediately. This is the first step to feeling better. The following steps will move you to safer ground and lead you in a positive direction out of depression. HOW TO COPE AND PREVENT SUICIDE  Let family, friends, teachers and/or counselors know. Get help. Try not to isolate yourself from those who care about you. Even though you may not feel sociable or think that you are not good company, talk with someone everyday. It is best if it is face to face. Remember, they will want to  help you.   Eat a regularly spaced and well-balanced diet, and get plenty of rest.   Avoid alcohol and drugs because they will only make you feel worse and may also lower your inhibitions. Remove them from the home. If you are thinking of taking an overdose of your prescribed medications, give your medicines to someone who can give them to you one day at a time. If you are on antidepressants, let your caregiver know of your feelings so he or she can provide a safer medication, if that is a concern.   Remove weapons or poisons from your home.   Try to stick to routines. That may mean just walking the dog or feeding the cat. Follow a schedule and remind yourself that you have to keep that schedule every day. Play with your pets. If it is possible, and you do not have a pet, get one. They give you a sense of well-being, lower your blood pressure and make your heart feel good. They need you, and we all want to be needed.   Set some realistic goals and achieve them. Make a list and cross things off as you go. Accomplishments give a sense of worth. Wait until you are feeling better before doing things you find difficult or unpleasant to do.   If you are able, try to start exercising. Even half-hour periods of exercise each day will make you feel better. Getting out in the sun or into nature helps you recover from depression faster. If you have a favorite place to walk, take advantage of that.   Increase safe activities that have always given you pleasure. This may include playing your favorite music, reading a good book, painting a picture or playing your favorite instrument. Do whatever takes your mind off your depression and puts a smile on your face.   Keep your living space well lit with windows open, and let the sun shine in. Bright light definitely treats depression, not just people with the seasonal affective disorders (SAD).  Above all else remember, depression is temporary. It will go away. Do  not contemplate suicide. Death as a permanent solution is not the answer. Suicide will take away the beautiful rest of life, and do lifelong harm to those around you who love you. Help is available. National Suicide Help Lines with 24 hour help are: 1-800-SUICIDE (249)042-4499 Document Released: 06/07/2003 Document Revised: 08/13/2011 Document Reviewed: 10/26/2007 [facility] Patient Information 2012 [place], [place].

## 2012-01-12 NOTE — ED Notes (Signed)
Patient provided with soup and crackers at this time.

## 2012-01-12 NOTE — ED Provider Notes (Signed)
Medical screening examination/treatment/procedure(s) were performed by non-physician practitioner and as supervising physician I was immediately available for consultation/collaboration.    Jadalee Westcott L Caramia Boutin, MD 01/12/12 2258 

## 2012-01-12 NOTE — ED Notes (Signed)
Patient provided with soup and crackers at this time. Updated on plan of care. Awaiting psych consult recommendation and discharge paperwork.

## 2012-01-12 NOTE — ED Notes (Signed)
Patient provided with soda x2.

## 2012-01-12 NOTE — BH Assessment (Signed)
Assessment Note   [name] is a 26 y.o. female who presents voluntarily with her mother to [facility]. Pt had self-inflicted lacerations on left forearm due to anger over today's breakup with boyfriend of 9 mos. Pt states it was not a suicide attempt and she denies SI. She describes mood as "moody". She stated she "was battling depression". Pt endorses fatigue but no other depressive symptoms. She reports hx of bipolar disorder. Pt states she is currently "manic" and that her mania is caused by "not getting enough sleep". Pt endorses decreased amt of sleep. She endorses minimal anxiety. Affect is irritable and sad. However, pt reports 3 or 4 previous suicide attempts but unable to recall dates of prior attempts.  Poor insight and poor impulse control. Pt denies hx of substance abuse. She denies HI and A/VH.   Pt reports she has appt 01/11/12 at 2 pm with her psychiatrist Dr. Lolly Mustache and has appt on 01/12/12 with her therapist Adelene Amas at [facility].  Axis I: Bipolar Disorder Axis II: Cluster B Traits Axis III:  Past Medical History  Diagnosis Date  . Bipolar disorder   . Attention deficit hyperactivity disorder   . Depression   . Asthma    Axis IV: other psychosocial or environmental problems, problems related to social environment and problems with primary support group Axis V: 51-60 moderate symptoms  Past Medical History:  Past Medical History  Diagnosis Date  . Bipolar disorder   . Attention deficit hyperactivity disorder   . Depression   . Asthma     Past Surgical History  Procedure Date  . Mouth surgery     Family History: History reviewed. No pertinent family history.  Social History:  reports that she has never smoked. She does not have any smokeless tobacco history on file. She reports that she does not drink alcohol or use illicit drugs.  Additional Social History:  Alcohol / Drug Use Pain Medications: none Prescriptions: none Over the Counter:  none History of alcohol / drug use?: No history of alcohol / drug abuse Longest period of sobriety (when/how long): n/a Allergies:  Allergies  Allergen Reactions  . Depakote     Pt had h/o intolerance from Depakote. Stated that she never wants this med.    Home Medications:  Medications Prior to Admission  Medication Dose Route Frequency Provider Last Rate Last Dose  . bacitracin ointment 1 application  1 application Topical Once Cyndra Numbers, MD   1 application at 01/11/12 2346  . lamoTRIgine (LAMICTAL) tablet 150 mg  150 mg Oral BID Pascal Lux Wingen, PA-C   150 mg at 01/12/12 0128  . TDaP (BOOSTRIX) injection 0.5 mL  0.5 mL Intramuscular Once [employer], PA-C   0.5 mL at 01/11/12 2313  . traZODone (DESYREL) tablet 300 mg  300 mg Oral QHS Pascal Lux Wingen, PA-C   300 mg at 01/12/12 0128  . DISCONTD: ARIPiprazole (ABILIFY) tablet 20 mg  20 mg Oral Daily Pascal Lux Wingen, PA-C       Medications Prior to Admission  Medication Sig Dispense Refill  . ARIPiprazole (ABILIFY) 20 MG tablet Take 20 mg by mouth daily.        [name] lamoTRIgine (LAMICTAL) 200 MG tablet Take 150 mg by mouth 2 (two) times daily.       . traZODone (DESYREL) 100 MG tablet Take 300 mg by mouth at bedtime.         OB/GYN Status:  Patient's last menstrual period  was 12/09/2011.  General Assessment Data Location of Assessment: [facility] ED Living Arrangements: Family members;Parent (mom, brother) Can pt return to current living arrangement?: Yes Admission Status: Voluntary Is patient capable of signing voluntary admission?: Yes Transfer from: Home Referral Source: Self/Family/Friend     Risk to self Suicidal Ideation: No Suicidal Intent: No Is patient at risk for suicide?: No Suicidal Plan?: No Access to Means: No What has been your use of drugs/alcohol within the last 12 months?: none Previous Attempts/Gestures: Yes How many times?: 3  Triggers for Past Attempts: Unpredictable Intentional Self  Injurious Behavior: Cutting Comment - Self Injurious Behavior: hx of cutting Family Suicide History: Unknown Recent stressful life event(s): Conflict (Comment) (break up w/ boyfriend) Persecutory voices/beliefs?: No Depression: No Depression Symptoms: Fatigue Substance abuse history and/or treatment for substance abuse?: No Suicide prevention information given to non-admitted patients: Not applicable  Risk to Others Homicidal Ideation: No Thoughts of Harm to Others: No Current Homicidal Intent: No Current Homicidal Plan: No Access to Homicidal Means: No Identified Victim: n.a History of harm to others?: No Violent Behavior Description: n/a Criminal Charges Pending?: No Does patient have a court date: No  Psychosis Hallucinations: None noted Delusions: None noted  Mental Status Report Appear/Hygiene:  (appropriate) Eye Contact: Good Motor Activity: Freedom of movement Speech: Logical/coherent Level of Consciousness: Drowsy;Alert Mood: Irritable ("moody") Affect: Irritable;Silly Anxiety Level: Minimal Thought Processes: Coherent;Relevant Judgement: Impaired Orientation: Person;Place;Time;Situation Obsessive Compulsive Thoughts/Behaviors: None  Cognitive Functioning Concentration: Decreased Memory: Recent Impaired IQ: Average Insight: Poor Impulse Control: Poor Appetite: Fair Weight Loss: 0  Weight Gain: 0  Sleep: Decreased Total Hours of Sleep:  (unknown) Vegetative Symptoms: None  Prior Inpatient Therapy Prior Inpatient Therapy: Yes Prior Therapy Dates: several inpatient episodes Prior Therapy Facilty/Provider(s): [facility] Reason for Treatment: bipolar, SI  Prior Outpatient Therapy Prior Outpatient Therapy: Yes Prior Therapy Dates: currently Prior Therapy Facilty/Provider(s): [facility]/dr arfeen Reason for Treatment: med management/bipolar  ADL Screening (condition at time of admission) Patient's cognitive ability adequate to safely complete  daily activities?: Yes Patient able to express need for assistance with ADLs?: Yes Independently performs ADLs?: Yes Weakness of Legs: None Weakness of Arms/Hands: None       Abuse/Neglect Assessment (Assessment to be complete while patient is alone) Physical Abuse: Denies Verbal Abuse: Denies Sexual Abuse: Denies Exploitation of patient/patient's resources: Denies Self-Neglect: Denies Values / Beliefs Cultural Requests During Hospitalization: None Spiritual Requests During Hospitalization: None        Additional Information 1:1 In Past 12 Months?: No CIRT Risk: No Elopement Risk: No Does patient have medical clearance?: Yes     Disposition:  Disposition Disposition of Patient: Outpatient treatment Type of outpatient treatment: Adult  Telepsych ordered.   On Site Evaluation by:   Reviewed with Physician:     Thornell Sartorius 01/12/2012 2:38 AM

## 2012-01-12 NOTE — Progress Notes (Signed)
Writer completed telepsych order. Dr. Jacky Kindle will conduct telepsych. EPD and pt's RN notified.

## 2012-01-26 ENCOUNTER — Ambulatory Visit (INDEPENDENT_AMBULATORY_CARE_PROVIDER_SITE_OTHER): Payer: Medicare Other | Admitting: Internal Medicine

## 2012-01-26 ENCOUNTER — Encounter: Payer: Self-pay | Admitting: Internal Medicine

## 2012-01-26 ENCOUNTER — Telehealth: Payer: Self-pay | Admitting: *Deleted

## 2012-01-26 DIAGNOSIS — J209 Acute bronchitis, unspecified: Secondary | ICD-10-CM

## 2012-01-26 MED ORDER — DM-GUAIFENESIN ER 30-600 MG PO TB12: 1.0000 | ORAL_TABLET | Freq: Two times a day (BID) | ORAL | Status: AC

## 2012-01-26 NOTE — Telephone Encounter (Signed)
Pt's mother calls and states pt has runny nose, cough and diarrhea for several days. Does not know about fevers.  appt per chilonb. 1445 dr Scot Dock. Pt and mother are agreeable

## 2012-01-26 NOTE — Assessment & Plan Note (Signed)
Given history and physical, this sounds like bilateral bronchitis. Will not prescribe any antibiotics at this time. Plan continue with symptomatic management with Mucinex and over-the-counter decongestants. Encourage hydration and vitamin C. We did not check for flu PCR in the clinic. The patient is 3 days in this illness so Tamiflu may not work  Past patient to call us back  by the end of this week if symptoms aren't better. Will give a prescription of Z-Pak at that time.

## 2012-01-26 NOTE — Progress Notes (Signed)
Patient ID: SHERAN NEWSTROM, female   DOB: 10/22/1986, 26 y.o.   MRN: [id number]  26 year old woman with ADHD and bipolar disorder comes to the clinic complaining of cough, sore throat, fatigue, runny nose and headache for the past 3 days. She does not remember how it started. Symptoms have been stable. Takes over-the-counter decongestant with some relief. Was incontinent with her mother who was diagnosed with COPD exacerbation last month and was in the hospital. Flu PCR was not checked in the hospital. No fever, chills, sputum production, chest pain or shortness of breath.  Physical exam   General Appearance:     Filed Vitals:   01/26/12 1426  BP: 118/77  Pulse: 82  Temp: 98.3 F (36.8 C)  TempSrc: Oral  Height: 5\' 6"  (1.676 m)  Weight: 222 lb 4.8 oz (100.835 kg)  SpO2: 97%     Alert, cooperative, no distress, appears stated age  Head:    Normocephalic, without obvious abnormality, atraumatic  Eyes:    PERRL, conjunctiva/corneas clear, EOM's intact, fundi    benign, both eyes     HEENT: Posterior pharyngeal wall congested and erythematous. No tonsillar enlargement.   Neck:   Supple, symmetrical, trachea midline, no adenopathy;       thyroid:  No enlargement/tenderness/nodules; no carotid   bruit or JVD  Lungs:     Clear to auscultation bilaterally, respirations unlabored  Chest wall:    No tenderness or deformity  Heart:    Regular rate and rhythm, S1 and S2 normal, no murmur, rub   or gallop  Abdomen:     Soft, non-tender, bowel sounds active all four quadrants,    no masses, no organomegaly  Extremities:   Extremities normal, atraumatic, no cyanosis or edema  Pulses:   2+ and symmetric all extremities  Skin:   Skin color, texture, turgor normal, no rashes or lesions  Neurologic:  nonfocal grossly   Review of systems As per history of present illness otherwise Constitutional: Denies fever, chills, diaphoresis, appetite change Respiratory: Denies SOB, DOE,, chest  tightness,  and wheezing.   Cardiovascular: Denies chest pain, palpitations and leg swelling.  Gastrointestinal: Denies nausea, vomiting, abdominal pain, diarrhea, constipation, blood in stool and abdominal distention.  Skin: Denies pallor, rash and wound.  Neurological: Denies dizziness, light-headedness, numbness and headaches.

## 2012-01-26 NOTE — Patient Instructions (Signed)
Call the clinic if you are not feeling better by the end of this week  Influenza Facts Flu (influenza) is a contagious respiratory illness caused by the influenza viruses. It can cause mild to severe illness. While most healthy people recover from the flu without specific treatment and without complications, older people, young children, and people with certain health conditions are at higher risk for serious complications from the flu, including death. CAUSES   The flu virus is spread from person to person by respiratory droplets from coughing and sneezing.   A person can also become infected by touching an object or surface with a virus on it and then touching their mouth, eye or nose.   Adults may be able to infect others from 1 day before symptoms occur and up to 7 days after getting sick. So it is possible to give someone the flu even before you know you are sick and continue to infect others while you are sick.  SYMPTOMS   Fever (usually high).   Headache.   Tiredness (can be extreme).   Cough.   Sore throat.   Runny or stuffy nose.   Body aches.   Diarrhea and vomiting may also occur, particularly in children.   These symptoms are referred to as "flu-like symptoms". A lot of different illnesses, including the common cold, can have similar symptoms.  DIAGNOSIS   There are tests that can determine if you have the flu as long you are tested within the first 2 or 3 days of illness.   A doctor's exam and additional tests may be needed to identify if you have a disease that is a complicating the flu.  RISKS AND COMPLICATIONS  Some of the complications caused by the flu include:  Bacterial pneumonia or progressive pneumonia caused by the flu virus.   Loss of body fluids (dehydration).   Worsening of chronic medical conditions, such as heart failure, asthma, or diabetes.   Sinus problems and ear infections.  HOME CARE INSTRUCTIONS   Seek medical care early on.   If you  are at high risk from complications of the flu, consult your health-care provider as soon as you develop flu-like symptoms. Those at high risk for complications include:   People 65 years or older.   People with chronic medical conditions, including diabetes.   Pregnant women.   Young children.   Your caregiver may recommend use of an antiviral medication to help treat the flu.   If you get the flu, get plenty of rest, drink a lot of liquids, and avoid using alcohol and tobacco.   You can take over-the-counter medications to relieve the symptoms of the flu if your caregiver approves. (Never give aspirin to children or teenagers who have flu-like symptoms, particularly fever).  PREVENTION  The single best way to prevent the flu is to get a flu vaccine each fall. Other measures that can help protect against the flu are:  Antiviral Medications   A number of antiviral drugs are approved for use in preventing the flu. These are prescription medications, and a doctor should be consulted before they are used.   Habits for Good Health   Cover your nose and mouth with a tissue when you cough or sneeze, throw the tissue away after you use it.   Wash your hands often with soap and water, especially after you cough or sneeze. If you are not near water, use an alcohol-based hand cleaner.   Avoid people who are sick.  If you get the flu, stay home from work or school. Avoid contact with other people so that you do not make them sick, too.   Try not to touch your eyes, nose, or mouth as germs ore often spread this way.  IN CHILDREN, EMERGENCY WARNING SIGNS THAT NEED URGENT MEDICAL ATTENTION:  Fast breathing or trouble breathing.   Bluish skin color.   Not drinking enough fluids.   Not waking up or not interacting.   Being so irritable that the child does not want to be held.   Flu-like symptoms improve but then return with fever and worse cough.   Fever with a rash.  IN ADULTS,  EMERGENCY WARNING SIGNS THAT NEED URGENT MEDICAL ATTENTION:  Difficulty breathing or shortness of breath.   Pain or pressure in the chest or abdomen.   Sudden dizziness.   Confusion.   Severe or persistent vomiting.  SEEK IMMEDIATE MEDICAL CARE IF:  You or someone you know is experiencing any of the symptoms above. When you arrive at the emergency center,report that you think you have the flu. You may be asked to wear a mask and/or sit in a secluded area to protect others from getting sick. MAKE SURE YOU:   Understand these instructions.   Monitor your condition.   Seek medical care if you are getting worse, or not improving.  Document Released: 12/04/2003 Document Revised: 08/13/2011 Document Reviewed: 08/30/2009 [facility] Patient Information 2012 [place], [place].

## 2012-02-02 ENCOUNTER — Ambulatory Visit (INDEPENDENT_AMBULATORY_CARE_PROVIDER_SITE_OTHER): Payer: Medicare Other | Admitting: Psychiatry

## 2012-02-02 ENCOUNTER — Emergency Department (HOSPITAL_COMMUNITY)
Admission: EM | Admit: 2012-02-02 | Discharge: 2012-02-02 | Disposition: A | Discharge status: Home / Self Care | Payer: Medicare Other | Source: Home / Self Care | Attending: Emergency Medicine | Admitting: Emergency Medicine

## 2012-02-02 ENCOUNTER — Encounter (HOSPITAL_COMMUNITY): Payer: Self-pay | Admitting: Psychiatry

## 2012-02-02 DIAGNOSIS — F988 Other specified behavioral and emotional disorders with onset usually occurring in childhood and adolescence: Secondary | ICD-10-CM

## 2012-02-02 DIAGNOSIS — F319 Bipolar disorder, unspecified: Secondary | ICD-10-CM

## 2012-02-02 MED ORDER — METHYLPHENIDATE HCL ER (LA) 10 MG PO CP24: 10.0000 mg | ORAL_CAPSULE | Freq: Every day | ORAL | Status: DC

## 2012-02-02 MED ORDER — LAMOTRIGINE 150 MG PO TABS: 150.0000 mg | ORAL_TABLET | Freq: Two times a day (BID) | ORAL | Status: DC

## 2012-02-02 MED ORDER — ARIPIPRAZOLE 30 MG PO TABS: 30.0000 mg | ORAL_TABLET | Freq: Every day | ORAL | Status: DC

## 2012-02-02 MED ORDER — TRAZODONE HCL 300 MG PO TABS: 300.0000 mg | ORAL_TABLET | Freq: Every day | ORAL | Status: DC

## 2012-02-02 NOTE — ED Notes (Signed)
Pt called x 3 .  °

## 2012-02-02 NOTE — Progress Notes (Signed)
Chief complaint I'm taking my medication  History of presenting illness Patient's 26 year old Caucasian female who came for her followup appointment with her mother. Patient was seen today at urgent care last week due to flulike symptoms. Patient is now doing better. Patient mother is concerned as patient restarted her relationship with her boyfriend. Patient is not sleeping very well and she talks with her boyfriend on the phone until midnight. However patient likes increase Abilify which is helping her mood swing and anger. Patient denies any agitation or any anger episode and last few weeks. She is tolerating Abilify 30 mg and reported no side effects. Patient also told her relationship is going very well. She does not want to end the relationship at this time. Patient has significant history of impulsive behavior. She had history of cutting herself when her demands are not met. He has h/o inpatient treatment. She [facility] however she has not heard any response from them. She is not using any drugs alcohol. She is seeing her therapist once a week.  Past psychiatric history Patient has a long history of impulsive behavior with multiple inpatient psychiatric treatment. She has history of suicidal attempt, cutting herself and aggressive behavior. She had history of treatment with Prozac Depakote Klonopin Concerta and Strattera.  Psychosocial history Patient lives with her mother. She has no children. She was involved in very intense relationship which ended yesterday.  Medical history Obesity  Mental status examination Patient is mildly obese female who is casually dressed and fairly groomed. Her attention and concentration is easily distracted. Her speech is fast, pressured and rapid at times. She is cooperative and maintained fair eye contact. She denies any active or passive suicidal thoughts or homicidal thoughts. She denies any paranoid thinking or  delusions. There no psychotic symptoms present. Her thought process is circumstantial. She's alert and oriented x3. Her insight, judgment and impulse control is fair.   Assessment Axis I bipolar disorder, ADD Axis II borderline intellect Axis III see medical history Axis IV moderate Axis V 50-55  Plan I talked to the patient again in length about her relationship. Patient has history of impulsive behavior when her relationship have ended. I explained risks and benefits of the medication along with risk of decompensation of her expectations not met in the relationship. This time patient is more aware about the consequences. She want t to try this relationship one more time. She will continue to see therapist and followup on CST program.  We talked about safety plan that in case if she feels worsening of her symptoms or any time having suicidal thoughts or homicidal thoughts and she need to call 911 or go local her. I will see her again in 4 weeks. Time spent 30 minutes

## 2012-02-02 NOTE — ED Notes (Signed)
Called second time without answer at 12:53. BHudson, Rn

## 2012-02-02 NOTE — ED Notes (Signed)
Called in waiting room at 12:35 with no response. Monna Fam, RN

## 2012-02-06 ENCOUNTER — Ambulatory Visit (INDEPENDENT_AMBULATORY_CARE_PROVIDER_SITE_OTHER): Payer: Medicare Other | Admitting: Internal Medicine

## 2012-02-06 ENCOUNTER — Encounter: Payer: Self-pay | Admitting: Internal Medicine

## 2012-02-06 VITALS — BP 117/74 | HR 77 | Temp 97.8°F | Ht 66.0 in | Wt 218.6 lb

## 2012-02-06 DIAGNOSIS — K921 Melena: Secondary | ICD-10-CM

## 2012-02-06 DIAGNOSIS — J209 Acute bronchitis, unspecified: Secondary | ICD-10-CM

## 2012-02-06 DIAGNOSIS — J40 Bronchitis, not specified as acute or chronic: Secondary | ICD-10-CM

## 2012-02-06 LAB — CBC WITH DIFFERENTIAL/PLATELET
Basophils Absolute: 0 10*3/uL (ref 0.0–0.1)
Basophils Relative: 0 % (ref 0–1)
Eosinophils Absolute: 0 10*3/uL (ref 0.0–0.7)
Eosinophils Relative: 0 % (ref 0–5)
HCT: 42.2 % (ref 36.0–46.0)
Hemoglobin: 13.2 g/dL (ref 12.0–15.0)
Lymphocytes Relative: 21 % (ref 12–46)
Lymphs Abs: 1.7 10*3/uL (ref 0.7–4.0)
MCH: 29.9 pg (ref 26.0–34.0)
MCHC: 31.3 g/dL (ref 30.0–36.0)
MCV: 95.5 fL (ref 78.0–100.0)
Monocytes Absolute: 0.3 10*3/uL (ref 0.1–1.0)
Monocytes Relative: 4 % (ref 3–12)
Neutro Abs: 6 10*3/uL (ref 1.7–7.7)
Neutrophils Relative %: 75 % (ref 43–77)
Platelets: 221 10*3/uL (ref 150–400)
RBC: 4.42 MIL/uL (ref 3.87–5.11)
RDW: 12.5 % (ref 11.5–15.5)
WBC: 8 10*3/uL (ref 4.0–10.5)

## 2012-02-06 MED ORDER — GUAIFENESIN-CODEINE 100-10 MG/5ML PO SYRP: 5.0000 mL | ORAL_SOLUTION | Freq: Three times a day (TID) | ORAL | Status: AC | PRN

## 2012-02-06 MED ORDER — AZITHROMYCIN 250 MG PO TABS: ORAL_TABLET | ORAL | Status: AC

## 2012-02-06 NOTE — Assessment & Plan Note (Signed)
She reports an episode of noticing trace amounts of blood in her stools yesterday. On my exam she was not noticed to have  external hemorrhoids, anal fissure or fistula. I think this could be related to internal hemorrhoid. Guaiac her stools and the results were negative. -Check CBC. -She was advised to call us if she has more episodes.

## 2012-02-06 NOTE — Assessment & Plan Note (Signed)
She reports symptoms of congestion, productive cough and fever for the last 10 days. I think this is most likely bronchitis. -Treat with Z-Pak. -She was advised to drink plenty of fluids and keep herself hydrated.

## 2012-02-06 NOTE — Patient Instructions (Signed)
Please schedule a follow up appointment as needed. . Please bring your medication bottles with your next appointment. Please take your medicines as prescribed. I will call you with your lab results if anything will be abnormal. Please drink plenty of fluids.

## 2012-02-06 NOTE — Progress Notes (Signed)
  Subjective:    Patient ID: [name], female    DOB: [birth year], 26 y.o.   MRN: [id number]  HPI: 26 year old woman with past medical history significant for attention deficit hyperactive disorder comes to the clinic for cough and congestion for last one week.  She was seen in our clinic about 10 days ago for viral URI and was given symptomatic treatment. She also went to the urgent care Center but states that she did not get any treatment from there, but records state that the patient left home before she was seen. She returns to the clinic today stating that she has been having productive cough for last one week there she is bringing up some yellowish phlegm associated with posttussive emesis. She states that she has been doing the sense yesterday she had 2 episodes in total the last one was this morning. She describes her vomiting is nonbilious and nonbloody .She also reports congestion, runny nose and low-grade fevers.  She also reports an episode of noticing small amount of blood while whiping when she moved her bowels yesterday. She states that this has never happened to her before. Also states that she does not have any problems with constipation.    Review of Systems  Constitutional: Positive for fever.  HENT: Negative for congestion and rhinorrhea.   Respiratory: Positive for cough and shortness of breath.   Cardiovascular: Negative for chest pain, palpitations and leg swelling.  Neurological: Negative for facial asymmetry, light-headedness and headaches.  Hematological: Negative for adenopathy.       Objective:   Physical Exam  Constitutional: She is oriented to person, place, and time. She appears well-developed and well-nourished. No distress.  HENT:  Head: Normocephalic and atraumatic.  Mouth/Throat: No oropharyngeal exudate.  Eyes: Conjunctivae and EOM are normal. Pupils are equal, round, and reactive to light.  Neck: Normal range of motion. Neck supple. No JVD  present. No tracheal deviation present. No thyromegaly present.  Cardiovascular: Normal rate, regular rhythm, normal heart sounds and intact distal pulses.   Pulmonary/Chest: No stridor. No respiratory distress. She has no wheezes. She has no rales.       Coarse breath sounds bilaterally  Abdominal: Soft. Bowel sounds are normal. She exhibits no distension. There is no tenderness. There is no rebound and no guarding.  Genitourinary:       Rectal exam- no external hemorrhoids noticed, guaiac negative  Musculoskeletal: Normal range of motion. She exhibits no edema and no tenderness.  Lymphadenopathy:    She has no cervical adenopathy.  Neurological: She is alert and oriented to person, place, and time. She has normal reflexes. No cranial nerve deficit. Coordination normal.  Skin: Skin is warm. She is not diaphoretic.          Assessment & Plan:

## 2012-03-01 ENCOUNTER — Encounter (HOSPITAL_COMMUNITY): Payer: Self-pay | Admitting: Psychiatry

## 2012-03-01 ENCOUNTER — Ambulatory Visit (INDEPENDENT_AMBULATORY_CARE_PROVIDER_SITE_OTHER): Payer: Medicare Other | Admitting: Psychiatry

## 2012-03-01 VITALS — BP 119/86 | HR 86 | Wt 220.0 lb

## 2012-03-01 DIAGNOSIS — F988 Other specified behavioral and emotional disorders with onset usually occurring in childhood and adolescence: Secondary | ICD-10-CM

## 2012-03-01 MED ORDER — METHYLPHENIDATE HCL ER (LA) 10 MG PO CP24: 10.0000 mg | ORAL_CAPSULE | Freq: Every day | ORAL | Status: DC

## 2012-03-01 NOTE — Progress Notes (Signed)
Chief complaint I medication management and followup  Current psychiatric medication Abilify 30 mg daily Lamictal 150 mg twice a day Ritalin 10 mg daily  History of presenting illness Patient's 26 year old Caucasian female who came for her followup appointment with her mother. Patient is compliant with the medication and reported no side effects. She started again relationship and having some tense moment on the phone. As per mother patient continue to be in the age and impulsive behavior. She was notice yelling cursing on the phone however she is able to calm herself and redirect. She was able to listen her mother and go to sleep. She is seeing therapist twice a week. She is taking Abilify and Lamictal without any side effects. She denies any tremors or shakes. She has gained some weight from the past however she also admitted eating sweets and not compliance with her calorie intake. The patient and her mother are still waiting from community support group program.  Past psychiatric history Patient has a long history of impulsive behavior with multiple inpatient psychiatric treatment. She has history of suicidal attempt, cutting herself and aggressive behavior. She had history of treatment with Prozac Depakote Klonopin Concerta and Strattera.  Psychosocial history Patient lives with her mother. She has no children. She was involved in very intense relationship which ended yesterday.  Medical history Obesity  Mental status examination Patient is mildly obese female who is casually dressed and fairly groomed. Her attention and concentration is easily distracted. Her speech is fast, pressured and rapid at times. She is cooperative and maintained fair eye contact. She denies any active or passive suicidal thoughts or homicidal thoughts. She denies any paranoid thinking or delusions. There no psychotic symptoms present. Her thought process is circumstantial. She's alert and oriented x3. Her insight,  judgment and impulse control is fair.   Assessment Axis I bipolar disorder, ADD Axis II borderline intellect Axis III see medical history Axis IV moderate Axis V 50-55  Plan I review psychosocial stressors, her current relationship with intense moment, last progress note and collateral information. One more time I reinforce about consequences if her relationship does not go very well. This time patient feel she has more control of her behavior and she is able to listen her mother when she feel very agitated. I will continue her current medication at this time. I also encouraged her to watch her diet carefully to avoid further weight gain. Risk and benefit explained in detail. I will see her again in 2 months. Time spent 30 minutes.

## 2012-03-29 ENCOUNTER — Encounter (HOSPITAL_COMMUNITY): Payer: Self-pay | Admitting: Psychiatry

## 2012-03-29 ENCOUNTER — Ambulatory Visit (INDEPENDENT_AMBULATORY_CARE_PROVIDER_SITE_OTHER): Payer: Medicare Other | Admitting: Psychiatry

## 2012-03-29 VITALS — Wt 222.3 lb

## 2012-03-29 DIAGNOSIS — F319 Bipolar disorder, unspecified: Secondary | ICD-10-CM

## 2012-03-29 DIAGNOSIS — F988 Other specified behavioral and emotional disorders with onset usually occurring in childhood and adolescence: Secondary | ICD-10-CM

## 2012-03-29 MED ORDER — METHYLPHENIDATE HCL ER (LA) 10 MG PO CP24: 10.0000 mg | ORAL_CAPSULE | Freq: Every day | ORAL | Status: DC

## 2012-03-29 MED ORDER — TRAZODONE HCL 300 MG PO TABS: 300.0000 mg | ORAL_TABLET | Freq: Every day | ORAL | Status: DC

## 2012-03-29 MED ORDER — ARIPIPRAZOLE 30 MG PO TABS: 30.0000 mg | ORAL_TABLET | Freq: Every day | ORAL | Status: DC

## 2012-03-29 MED ORDER — LAMOTRIGINE 150 MG PO TABS: 150.0000 mg | ORAL_TABLET | Freq: Two times a day (BID) | ORAL | Status: DC

## 2012-03-29 NOTE — Progress Notes (Signed)
Chief complaint Medication management and followup  Current psychiatric medication Abilify 30 mg daily Lamictal 150 mg twice a day Ritalin 10 mg daily  History of presenting illness Patient's 26 year old Caucasian female who came for her followup appointment with her mother. Patient is compliant with the medication and reported no side effects. She reported her relationship is going better.  She continues to have episodic agitation and anger but lately she denies any aggressive episode.  She is trying to get a job and applying with his places.  She sleeps better since we have increased the trazodone.  Her appetite remains unchanged.  Despite trying losing her weight she has not able to get weight loss.  However she is not disappointed and continues to work on weight loss.  She denies any tremors or rash.  Past psychiatric history Patient has a long history of impulsive behavior with multiple inpatient psychiatric treatment. She has history of suicidal attempt, cutting herself and aggressive behavior. She had history of treatment with Prozac Depakote Klonopin Concerta and Strattera.  Psychosocial history Patient lives with her mother. She has no children. She was involved in very intense relationship which ended yesterday.  Medical history Obesity  Mental status examination Patient is mildly obese female who is casually dressed and fairly groomed.  She is anxious but cooperative.  Her attention and concentration is distracted. Her speech is fast, pressured and rapid at times. She is cooperative and maintained fair eye contact. She denies any active or passive suicidal thoughts or homicidal thoughts. She denies any paranoid thinking or delusions. There no psychotic symptoms present. Her thought process is circumstantial. She's alert and oriented x3. Her insight, judgment and impulse control is fair.   Assessment Axis I bipolar disorder, ADD Axis II borderline intellect Axis III see medical  history Axis IV moderate Axis V 50-55  Plan I will continue her current medication.  I explained risks and benefits of medication.  I continued to encourage for whether exercise and monitor her diet.  I will see her again in 2 months.

## 2012-04-11 ENCOUNTER — Inpatient Hospital Stay (HOSPITAL_COMMUNITY)
Admission: RE | Admit: 2012-04-11 | Discharge: 2012-04-15 | DRG: 885 | Disposition: A | Discharge status: Home / Self Care | Payer: Medicare Other | Source: Ambulatory Visit | Attending: Psychiatry | Admitting: Psychiatry

## 2012-04-11 ENCOUNTER — Encounter (HOSPITAL_COMMUNITY): Payer: Self-pay

## 2012-04-11 DIAGNOSIS — F603 Borderline personality disorder: Secondary | ICD-10-CM | POA: Diagnosis present

## 2012-04-11 DIAGNOSIS — F3130 Bipolar disorder, current episode depressed, mild or moderate severity, unspecified: Principal | ICD-10-CM | POA: Diagnosis present

## 2012-04-11 DIAGNOSIS — F319 Bipolar disorder, unspecified: Secondary | ICD-10-CM

## 2012-04-11 DIAGNOSIS — F909 Attention-deficit hyperactivity disorder, unspecified type: Secondary | ICD-10-CM | POA: Diagnosis present

## 2012-04-11 DIAGNOSIS — J45909 Unspecified asthma, uncomplicated: Secondary | ICD-10-CM | POA: Diagnosis present

## 2012-04-11 HISTORY — DX: Anxiety disorder, unspecified: F41.9
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
## 2012-04-12 DIAGNOSIS — F319 Bipolar disorder, unspecified: Secondary | ICD-10-CM

## 2012-04-12 DIAGNOSIS — F909 Attention-deficit hyperactivity disorder, unspecified type: Secondary | ICD-10-CM

## 2012-04-12 LAB — COMPREHENSIVE METABOLIC PANEL
ALT: 11 U/L (ref 0–35)
AST: 13 U/L (ref 0–37)
Albumin: 3.6 g/dL (ref 3.5–5.2)
Alkaline Phosphatase: 72 U/L (ref 39–117)
BUN: 10 mg/dL (ref 6–23)
CO2: 25 mEq/L (ref 19–32)
Calcium: 9.4 mg/dL (ref 8.4–10.5)
Chloride: 104 mEq/L (ref 96–112)
Creatinine, Ser: 0.97 mg/dL (ref 0.50–1.10)
GFR calc Af Amer: >90 mL/min (ref >90)
GFR calc non Af Amer: 81 mL/min — ABNORMAL LOW (ref >90)
Glucose, Bld: 101 mg/dL — ABNORMAL HIGH (ref 70–99)
Potassium: 4 mEq/L (ref 3.5–5.1)
Sodium: 140 mEq/L (ref 135–145)
Total Bilirubin: 0.2 mg/dL — ABNORMAL LOW (ref 0.3–1.2)
Total Protein: 6.9 g/dL (ref 6.0–8.3)

## 2012-04-12 LAB — LIPID PANEL
Cholesterol: 142 mg/dL (ref 0–200)
HDL: 50 mg/dL (ref >39)
LDL Cholesterol: 78 mg/dL (ref 0–99)
Total CHOL/HDL Ratio: 2.8 RATIO
Triglycerides: 71 mg/dL (ref <150)
VLDL: 14 mg/dL (ref 0–40)

## 2012-04-12 LAB — CBC
HCT: 41.3 % (ref 36.0–46.0)
Hemoglobin: 13.4 g/dL (ref 12.0–15.0)
MCH: 30.1 pg (ref 26.0–34.0)
MCHC: 32.4 g/dL (ref 30.0–36.0)
MCV: 92.8 fL (ref 78.0–100.0)
Platelets: 215 10*3/uL (ref 150–400)
RBC: 4.45 MIL/uL (ref 3.87–5.11)
RDW: 13 % (ref 11.5–15.5)
WBC: 5.8 10*3/uL (ref 4.0–10.5)

## 2012-04-12 MED ORDER — ALUM & MAG HYDROXIDE-SIMETH 200-200-20 MG/5ML PO SUSP: 30.0000 mL | ORAL | Status: DC | PRN

## 2012-04-12 MED ORDER — CITALOPRAM HYDROBROMIDE 20 MG PO TABS
20.0000 mg | ORAL_TABLET | Freq: Every day | ORAL | Status: DC
  Administered 2012-04-12 – 2012-04-13 (×2): 20 mg via ORAL
  Filled 2012-04-12 (×4): qty 1

## 2012-04-12 MED ORDER — MAGNESIUM HYDROXIDE 400 MG/5ML PO SUSP: 30.0000 mL | Freq: Every day | ORAL | Status: DC | PRN

## 2012-04-12 MED ORDER — ACETAMINOPHEN 325 MG PO TABS: 650.0000 mg | ORAL_TABLET | Freq: Four times a day (QID) | ORAL | Status: DC | PRN

## 2012-04-12 MED ORDER — TRAZODONE HCL 150 MG PO TABS
300.0000 mg | ORAL_TABLET | Freq: Once | ORAL | Status: DC
  Filled 2012-04-12: qty 1
  Filled 2012-04-12: qty 2

## 2012-04-12 MED ORDER — LAMOTRIGINE 150 MG PO TABS
150.0000 mg | ORAL_TABLET | Freq: Once | ORAL | Status: DC
  Filled 2012-04-12: qty 6
  Filled 2012-04-12: qty 1

## 2012-04-12 NOTE — Progress Notes (Signed)
Patient seen during d/c planning group.  She advised of increased depression and SI due to breakup with boyfriend.  Patient advised she does not know if she can live without him.  She is denying SI/HI today.  She rates depression at five and anxiety at six. She reports having outpatient providers and access to medications.

## 2012-04-12 NOTE — Tx Team (Signed)
Initial Interdisciplinary Treatment Plan  PATIENT STRENGTHS: (choose at least two) Ability for insight Active sense of humor General fund of knowledge Motivation for treatment/growth Physical Health Religious Affiliation Special hobby/interest Supportive family/friends  PATIENT STRESSORS: Financial difficulties Loss of ... recent breakup with boyfriend *   PROBLEM LIST: Problem List/Patient Goals Date to be addressed Date deferred Reason deferred Estimated date of resolution  Increased risk for Si      Anxiety      Depression                                           DISCHARGE CRITERIA:  Ability to meet basic life and health needs Improved stabilization in mood, thinking, and/or behavior Motivation to continue treatment in a less acute level of care Need for constant or close observation no longer present Reduction of life-threatening or endangering symptoms to within safe limits Verbal commitment to aftercare and medication compliance  PRELIMINARY DISCHARGE PLAN: Attend PHP/IOP Outpatient therapy  PATIENT/FAMIILY INVOLVEMENT: This treatment plan has been presented to and reviewed with the patient, [name].  The patient and family have been given the opportunity to ask questions and make suggestions.  Manfred Shirts, RN 04/12/2012, 4:47 AM
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
## 2012-04-12 NOTE — Progress Notes (Signed)
Recreation Therapy Group Note  Date: 04/12/2012        Time: 1145       Group Topic/Focus: Patient invited to participate in animal assisted therapy. Pets as a coping skill and responsibility were discussed.   Participation Level: Minimal  Participation Quality: Attentive  Affect: Blunted  Cognitive: Alert  Additional Comments: None
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
## 2012-04-12 NOTE — Progress Notes (Signed)
Pt has been visible in milieu and interacting appropriately with peers. Appears flat and depressed. Calm and cooperative with assessment. No acute distress noted. She is childlike at times. States she is adjusting to the milieu and is going to groups. Wanted information about her anti-depressant. Information printed and reviewed. Understanding verbalized. Did have some concern about not having trazodone ordered. Support and encouragement provided. Writer assured her he would call on call MD if she tried to lay down for one hour and did not fall asleep. States she was agreeable with that plan. Otherwise offered no questions or concerns. Denied SI/HI/AVH and contracted for safety. POC reviewed and understanding verbalized. Safety has been maintained with Q15 minute observation. Will continue current POC.

## 2012-04-12 NOTE — Progress Notes (Addendum)
[facility] Group Notes: (Counselor/Nursing/MHT/Case Management/Adjunct) 04/12/2012   @  11:00am Overcoming Obstacles to Wellness   Type of Therapy:  Group Therapy  Participation Level:  None  Participation Quality:  Inattentive  Affect:  Blunted  Cognitive:  Appropriate  Insight:  None  Engagement in Group: None   Engagement in Therapy:  None  Modes of Intervention:  Support and Exploration  Summary of Progress/Problems:  [name] was not engaged in group process and left group early.  [name] 04/12/2012   12:44pm       [facility] Group Notes:  (Counselor/Nursing/MHT/Case Management/Adjunct) 04/12/2012  1:15pm From Suicide to [facility]   Type of Therapy:  Group Therapy  Participation Level:  Did Not Attend - [name] came to group but left again very soon after  [name] 04/12/2012  4:06 PM
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
## 2012-04-13 DIAGNOSIS — F603 Borderline personality disorder: Secondary | ICD-10-CM

## 2012-04-13 MED ORDER — CITALOPRAM HYDROBROMIDE 20 MG PO TABS
20.0000 mg | ORAL_TABLET | Freq: Every day | ORAL | Status: DC
  Administered 2012-04-14: 20 mg via ORAL
  Filled 2012-04-13 (×2): qty 1
  Filled 2012-04-13: qty 3

## 2012-04-13 MED ORDER — TRAZODONE HCL 150 MG PO TABS
300.0000 mg | ORAL_TABLET | Freq: Every day | ORAL | Status: DC
  Administered 2012-04-13 – 2012-04-14 (×2): 300 mg via ORAL
  Filled 2012-04-13 (×3): qty 2
  Filled 2012-04-13: qty 6

## 2012-04-13 MED ORDER — LAMOTRIGINE 150 MG PO TABS
150.0000 mg | ORAL_TABLET | Freq: Every day | ORAL | Status: DC
  Administered 2012-04-13 – 2012-04-14 (×2): 150 mg via ORAL
  Filled 2012-04-13 (×2): qty 1
  Filled 2012-04-13: qty 6
  Filled 2012-04-13: qty 1

## 2012-04-13 NOTE — Progress Notes (Addendum)
[facility] MD Progress Note  04/13/2012 10:45 PM  Diagnosis:  Axis I: Bipolar Disorder Axis II: Borderline Personality Dis.  ADL's:  Intact  Sleep: Good  Appetite:  Good  Suicidal Ideation:  Pt denies any suicidal ideation Homicidal Ideation:  Denies adamantly any homicidal thoughts.  Mental Status Examination/Evaluation: Objective:  Appearance: Casual  Eye Contact::  Good  Speech:  Clear and Coherent  Volume:  Normal  Mood:  Euphoric and Euthymic or a blend of this.  Pushing big time to leave.  Will need to check with family as to whether this makes any sense now.  Affect:  appearing pretty bright in affect, but am not sure if this ia a ruse.   Thought Process:  Coherent  Orientation:  Full  Thought Content:  WDL  Suicidal Thoughts:  No  Homicidal Thoughts:  No  Memory:  Immediate;   Fair  Judgement:  Fair  Insight:  Fair  Psychomotor Activity:  Normal  Concentration:  Fair  Recall:  Fair  Akathisia:  No  AIMS (if indicated):     Assets:  Communication Skills Desire for Improvement  Sleep:  Number of Hours: 6.75    Vital Signs:Blood pressure 132/81, pulse 120, temperature 97.9 F (36.6 C), temperature source Oral, resp. rate 18, height 5\' 6"  (1.676 m), weight 103.874 kg (229 lb). Current Medications: Current Facility-Administered Medications  Medication Dose Route Frequency Provider Last Rate Last Dose  . acetaminophen (TYLENOL) tablet 650 mg  650 mg Oral Q6H PRN Nehemiah Settle, MD      . alum & mag hydroxide-simeth (MAALOX/MYLANTA) 200-200-20 MG/5ML suspension 30 mL  30 mL Oral Q4H PRN Nehemiah Settle, MD      . citalopram (CELEXA) tablet 20 mg  20 mg Oral QHS Mike Craze, MD      . lamoTRIgine (LAMICTAL) tablet 150 mg  150 mg Oral Once Nehemiah Settle, MD      . lamoTRIgine (LAMICTAL) tablet 150 mg  150 mg Oral QHS Mike Craze, MD   150 mg at 04/13/12 2134  . magnesium hydroxide (MILK OF MAGNESIA) suspension 30 mL  30 mL Oral Daily  PRN Nehemiah Settle, MD      . traZODone (DESYREL) tablet 300 mg  300 mg Oral Once Nehemiah Settle, MD      . traZODone (DESYREL) tablet 300 mg  300 mg Oral QHS Mike Craze, MD   300 mg at 04/13/12 2135  . DISCONTD: citalopram (CELEXA) tablet 20 mg  20 mg Oral Daily Sanjuana Kava, NP   20 mg at 04/13/12 0932    Lab Results:  Results for orders placed during the hospital encounter of 04/11/12 (from the past 48 hour(s))  CBC     Status: Normal   Collection Time   04/12/12  6:27 AM      Component Value Range Comment   WBC 5.8  4.0 - 10.5 (K/uL)    RBC 4.45  3.87 - 5.11 (MIL/uL)    Hemoglobin 13.4  12.0 - 15.0 (g/dL)    HCT 16.1  09.6 - 04.5 (%)    MCV 92.8  78.0 - 100.0 (fL)    MCH 30.1  26.0 - 34.0 (pg)    MCHC 32.4  30.0 - 36.0 (g/dL)    RDW 40.9  81.1 - 91.4 (%)    Platelets 215  150 - 400 (K/uL)   COMPREHENSIVE METABOLIC PANEL     Status: Abnormal   Collection Time  04/12/12  6:27 AM      Component Value Range Comment   Sodium 140  135 - 145 (mEq/L)    Potassium 4.0  3.5 - 5.1 (mEq/L)    Chloride 104  96 - 112 (mEq/L)    CO2 25  19 - 32 (mEq/L)    Glucose, Bld 101 (*) 70 - 99 (mg/dL)    BUN 10  6 - 23 (mg/dL)    Creatinine, Ser 4.09  0.50 - 1.10 (mg/dL)    Calcium 9.4  8.4 - 10.5 (mg/dL)    Total Protein 6.9  6.0 - 8.3 (g/dL)    Albumin 3.6  3.5 - 5.2 (g/dL)    AST 13  0 - 37 (U/L)    ALT 11  0 - 35 (U/L)    Alkaline Phosphatase 72  39 - 117 (U/L)    Total Bilirubin 0.2 (*) 0.3 - 1.2 (mg/dL)    GFR calc non Af Amer 81 (*) >90 (mL/min)    GFR calc Af Amer >90  >90 (mL/min)   LIPID PANEL     Status: Normal   Collection Time   04/12/12  6:27 AM      Component Value Range Comment   Cholesterol 142  0 - 200 (mg/dL)    Triglycerides 71  <811 (mg/dL)    HDL 50  >91 (mg/dL)    Total CHOL/HDL Ratio 2.8      VLDL 14  0 - 40 (mg/dL)    LDL Cholesterol 78  0 - 99 (mg/dL)     Physical Findings: AIMS:  , ,  ,  ,    CIWA:    COWS:     Treatment Plan  Summary: Daily contact with patient to assess and evaluate symptoms and progress in treatment Medication management  Plan: Have staff check with her family as to whether she is any better or not. Restarting her medications and getting some sleep may be making a huge difference in her. Gave her the [employer] to develop her own coping strategy/serenity list. Dan Humphreys, Eldana Isip 04/13/2012, 10:45 PM

## 2012-04-13 NOTE — Progress Notes (Signed)
Pt. Brightens on approach. C/o being sleepy @ times. Pt. Turned in the urine specimen after the lab. had already left.Denies SI,HI, @ [facility].Pt. Contracts for safety & contracts not to cut.Pt. Took medications as prescribed. Continues on 15 minute checks. Pt. Safety maintained.

## 2012-04-13 NOTE — Progress Notes (Signed)
Slept well last nite, appetite is good, energy level is normal, ability to pay attention is good, depressed 2/10 and hopeless 1/10, denies SI or Hi, claims to be veery sleepy at this time, trying to attend groups without falling asleep, eating meals in the DR q33mins safety checks continue and support offered Safety maintained

## 2012-04-13 NOTE — Progress Notes (Addendum)
[facility] Group Notes:  (Counselor/Nursing/MHT/Case Management/Adjunct) 04/13/2012  11:00am Feelings About Diagnosis   Type of Therapy:  Group Therapy  Participation Level:  Did Not Attend     [name] 04/13/2012  12:43pm    [facility] Group Notes:  (Counselor/Nursing/MHT/Case Management/Adjunct)  04/13/2012  1:15pm Breathing & Meditation for Anger/Anxiety  Type of Therapy:  Group Therapy  Participation Level:  Did Not Attend    [name] 04/13/2012  4:21 PM

## 2012-04-14 LAB — URINALYSIS, ROUTINE W REFLEX MICROSCOPIC
Bilirubin Urine: NEGATIVE
Glucose, UA: NEGATIVE mg/dL
Hgb urine dipstick: NEGATIVE
Ketones, ur: NEGATIVE mg/dL
Leukocytes, UA: NEGATIVE
Nitrite: NEGATIVE
Protein, ur: NEGATIVE mg/dL
Specific Gravity, Urine: 1.027 (ref 1.005–1.030)
Urobilinogen, UA: 0.2 mg/dL (ref 0.0–1.0)
pH: 6 (ref 5.0–8.0)

## 2012-04-14 LAB — PREGNANCY, URINE: Preg Test, Ur: NEGATIVE

## 2012-04-14 MED ORDER — CITALOPRAM HYDROBROMIDE 20 MG PO TABS: 20.0000 mg | ORAL_TABLET | Freq: Every day | ORAL | Status: DC

## 2012-04-14 MED ORDER — TRAZODONE HCL 300 MG PO TABS: 300.0000 mg | ORAL_TABLET | Freq: Every day | ORAL | Status: DC

## 2012-04-14 MED ORDER — LAMOTRIGINE 150 MG PO TABS: 150.0000 mg | ORAL_TABLET | Freq: Two times a day (BID) | ORAL | Status: DC

## 2012-04-14 MED ORDER — FRUIT OF THE EARTH/ALOE VERA EX LOTN: 1.0000 "application " | TOPICAL_LOTION | CUTANEOUS | Status: DC | PRN

## 2012-04-14 NOTE — Progress Notes (Signed)
PHARMACIST - PHYSICIAN ORDER COMMUNICATION  CONCERNING: P&T Medication Policy on Herbal Medications  DESCRIPTION:  This patient's order for: Fruit of the earth/Aloe Caesar Chestnut has been noted.  This product(s) is classified as an "herbal" or natural product. Due to a lack of definitive safety studies or FDA approval, nonstandard manufacturing practices, plus the potential risk of unknown drug-drug interactions while on inpatient medications, the Pharmacy and Therapeutics Committee does not permit the use of "herbal" or natural products of this type within [facility].   ACTION TAKEN: The pharmacy department is unable to verify this order at this time and your patient has been informed of this safety policy. Please reevaluate patient's clinical condition at discharge and address if the herbal or natural product(s) should be resumed at that time.  Geoffry Paradise, PharmD.   Pager:  [phone] 4:24 PM

## 2012-04-14 NOTE — Progress Notes (Signed)
Pt denies SI/HI/AVH. Pt intrusive and assertive. Pt childlike in interaction. Pt attended groups. Support and encouragement offered. Pt receptive.

## 2012-04-14 NOTE — Progress Notes (Signed)
Pt resting in bed with eyes closed.  No distress observed.  Safety maintained with q15 minute checks. 

## 2012-04-14 NOTE — Tx Team (Signed)
Interdisciplinary Treatment Plan Update (Adult)  Date:  04/14/2012  Time Reviewed:  11:02 AM   Progress in Treatment: Attending groups:   Yes   Participating in groups:  Yes Taking medication as prescribed:  Yes Tolerating medication:  Yes Family/Significant othe contact made: Contact made with mother Patient understands diagnosis:  Yes Discussing patient identified problems/goals with staff: Yes Medical problems stabilized or resolved: Yes Denies suicidal/homicidal ideation:Yes Issues/concerns per patient self-inventory:  Other:  New problem(s) identified:  Reason for Continuation of Hospitalization: Anxiety Depression Medication stabilization  Interventions implemented related to continuation of hospitalization:  Medication Management; safety checks q 15 mins  Additional comments:  Estimated length of stay:  Discharge Plan:  New goal(s):  Review of initial/current patient goals per problem list:    1.  Goal(s):  Eliminate SI/Other thoughts of self-ham  Met:  Yes  Target date: d/c  As evidenced by: Patient is no longer endorsing SI/thoughts of self-harm  2.  Goal (s):   Reduce depression/anxiety (Previously rated both at six)  Met:  Yes  Target date: d/c  As evidenced by: Patient will rate depression at four or below (currently rates all symptoms at zero)  3.  Goal(s):  Stabilize on medications  Met:  Yes  Target date: d/c  As evidenced by:  Patient reports meds are working - symptoms improved  Attendees: Patient:  [name] 04/14/2012  11:03 AM  Family:     Physician:  Orson Aloe, MD 04/14/2012 11:02 AM   Nursing:   Baird Cancer, RN 04/14/2012 11:02 AM   CaseManager:  Juline Patch, LCSW 04/14/2012 11:02 AM   Counselor:  Angus Palms, LCSW 04/14/2012 11:02 AM   Other:  Serena Colonel, NP 04/14/2012 11:02 AM   Other:  Reyes Ivan, LCSWA 04/14/2012  11:02 AM     Other:  Chinita Greenland, RN 04/14/2012  11:09 AM  Other:      Scribe for Treatment  Team:   Wynn Banker, LCSW,  04/14/2012 11:02 AM

## 2012-04-14 NOTE — [facility] Suicide Risk Assessment (Signed)
Suicide Risk Assessment  Discharge Assessment     Demographic factors:  Adolescent or young adult;Caucasian    Current Mental Status Per Nursing Assessment::   On Admission:   (Si on/off contracts for safety) At Discharge:     Loss Factors: Loss of significant relationship;Financial problems / change in socioeconomic status  Historical Factors: Prior suicide attempts;Family history of mental illness or substance abuse;Anniversary of important loss  Risk Reduction Factors:      Continued Clinical Symptoms:  Previous Psychiatric Diagnoses and Treatments  Discharge Diagnoses:   AXIS I:  Bipolar, Depressed AXIS II:  Borderline Personality Dis. AXIS III:   Past Medical History  Diagnosis Date  . Bipolar disorder   . Attention deficit hyperactivity disorder   . Depression   . Asthma   . Anxiety    AXIS IV:  other psychosocial or environmental problems AXIS V:  41-50 serious symptoms  Cognitive Features That Contribute To Risk:  Thought constriction (tunnel vision)    Suicide Risk:  Minimal: No identifiable suicidal ideation.  Patients presenting with no risk factors but with morbid ruminations; may be classified as minimal risk based on the severity of the depressive symptoms  Current Mental Status Per Physician: ADL's:  Intact  Sleep: Good  Appetite:  Good  Suicidal Ideation:  Denies adamantly any suicidal thoughts. Homicidal Ideation:  Denies adamantly any homicidal thoughts.  Mental Status Examination/Evaluation: Objective:  Appearance: Casual  Eye Contact::  Good  Speech:  Clear and Coherent  Volume:  Normal  Mood:  Euthymic  Affect:  Congruent  Thought Process:  Coherent  Orientation:  Full  Thought Content:  WDL  Suicidal Thoughts:  No  Homicidal Thoughts:  No  Memory:  Immediate;   Good  Judgement:  Good  Insight:  Good  Psychomotor Activity:  Normal  Concentration:  Good  Recall:  Good  Akathisia:  No  AIMS (if indicated):     Assets:   Communication Skills Desire for Improvement Financial Resources/Insurance Housing  Sleep: Number of Hours: 6.25    Vital Signs: Blood pressure 104/71, pulse 103, temperature 98.3 F (36.8 C), temperature source Oral, resp. rate 20, height 5\' 6"  (1.676 m), weight 103.874 kg (229 lb).  Labs Results for orders placed during the hospital encounter of 04/11/12 (from the past 72 hour(s))  CBC     Status: Normal   Collection Time   04/12/12  6:27 AM      Component Value Range Comment   WBC 5.8  4.0 - 10.5 (K/uL)    RBC 4.45  3.87 - 5.11 (MIL/uL)    Hemoglobin 13.4  12.0 - 15.0 (g/dL)    HCT 78.2  95.6 - 21.3 (%)    MCV 92.8  78.0 - 100.0 (fL)    MCH 30.1  26.0 - 34.0 (pg)    MCHC 32.4  30.0 - 36.0 (g/dL)    RDW 08.6  57.8 - 46.9 (%)    Platelets 215  150 - 400 (K/uL)   COMPREHENSIVE METABOLIC PANEL     Status: Abnormal   Collection Time   04/12/12  6:27 AM      Component Value Range Comment   Sodium 140  135 - 145 (mEq/L)    Potassium 4.0  3.5 - 5.1 (mEq/L)    Chloride 104  96 - 112 (mEq/L)    CO2 25  19 - 32 (mEq/L)    Glucose, Bld 101 (*) 70 - 99 (mg/dL)    BUN 10  6 -  23 (mg/dL)    Creatinine, Ser 1.30  0.50 - 1.10 (mg/dL)    Calcium 9.4  8.4 - 10.5 (mg/dL)    Total Protein 6.9  6.0 - 8.3 (g/dL)    Albumin 3.6  3.5 - 5.2 (g/dL)    AST 13  0 - 37 (U/L)    ALT 11  0 - 35 (U/L)    Alkaline Phosphatase 72  39 - 117 (U/L)    Total Bilirubin 0.2 (*) 0.3 - 1.2 (mg/dL)    GFR calc non Af Amer 81 (*) >90 (mL/min)    GFR calc Af Amer >90  >90 (mL/min)   LIPID PANEL     Status: Normal   Collection Time   04/12/12  6:27 AM      Component Value Range Comment   Cholesterol 142  0 - 200 (mg/dL)    Triglycerides 71  <865 (mg/dL)    HDL 50  >78 (mg/dL)    Total CHOL/HDL Ratio 2.8      VLDL 14  0 - 40 (mg/dL)    LDL Cholesterol 78  0 - 99 (mg/dL)   PREGNANCY, URINE     Status: Normal   Collection Time   04/13/12  7:48 PM      Component Value Range Comment   Preg Test, Ur NEGATIVE   NEGATIVE    URINALYSIS, ROUTINE W REFLEX MICROSCOPIC     Status: Normal   Collection Time   04/13/12  7:48 PM      Component Value Range Comment   Color, Urine YELLOW  YELLOW     APPearance CLEAR  CLEAR     Specific Gravity, Urine 1.027  1.005 - 1.030     pH 6.0  5.0 - 8.0     Glucose, UA NEGATIVE  NEGATIVE (mg/dL)    Hgb urine dipstick NEGATIVE  NEGATIVE     Bilirubin Urine NEGATIVE  NEGATIVE     Ketones, ur NEGATIVE  NEGATIVE (mg/dL)    Protein, ur NEGATIVE  NEGATIVE (mg/dL)    Urobilinogen, UA 0.2  0.0 - 1.0 (mg/dL)    Nitrite NEGATIVE  NEGATIVE     Leukocytes, UA NEGATIVE  NEGATIVE  MICROSCOPIC NOT DONE ON URINES WITH NEGATIVE PROTEIN, BLOOD, LEUKOCYTES, NITRITE, OR GLUCOSE <1000 mg/dL.    RISK REDUCTION FACTORS: What pt has learned from hospital stay is that she won't get what she wants when she wants it.  She has also learned that she is better off alone than with someone who constantly tears her down.  She adds that she thinks that the Celexa has helped her with her compulsiveness and her anger.  She didn't get angry like she usually does when disappointed about getting to go when she wanted to .  Risk of self harm is elevated by her diagnosis and her impulsivity, but she seems to be learning and practicing some patience.  She realizes that she has God and herself and her family to live for.  Risk of harm to others is minimal in that she has not been involved in fights or had any legal charges filed on her.  Pt seen in treatment team where she was confronted about her sudden shifts.  She was advised that she will need to wait 24 mor hours and she was able to handle that well.   PLAN: Discharge home Continue Medication List  As of 04/14/2012  7:49 PM   STOP taking these medications  ARIPiprazole 30 MG tablet      methylphenidate 10 MG 24 hr capsule         TAKE these medications         citalopram 20 MG tablet   Commonly known as: CELEXA   Take 1 tablet (20 mg  total) by mouth at bedtime. For depression.      lamoTRIgine 150 MG tablet   Commonly known as: LAMICTAL   Take 1 tablet (150 mg total) by mouth 2 (two) times daily. For mood control.      trazodone 300 MG tablet   Commonly known as: DESYREL   Take 1 tablet (300 mg total) by mouth at bedtime. For insomnia.           Follow-up recommendations:  Activities: Resume typical activities Diet: Resume typical diet Other: Follow up with outpatient provider and report any side effects to out patient prescriber.  Abdikadir Fohl 04/14/2012, 7:47 PM

## 2012-04-14 NOTE — Progress Notes (Signed)
[facility] Family/Significant Other Suicide Prevention Education  Suicide Prevention Education:  Education Completed; Aralynn Brake, mother, [phone]) has been identified by the patient as the family member/significant other with whom the patient will be residing, and identified as the person(s) who will aid the patient in the event of a mental health crisis (suicidal ideations/suicide attempt).  With written consent from the patient, the family member/significant other has been provided the following suicide prevention education, prior to the and/or following the discharge of the patient.  The suicide prevention education provided includes the following:  Suicide risk factors  Suicide prevention and interventions  National Suicide Hotline telephone number  [facility] assessment telephone number  [facility] Emergency Assistance 911  [facility] and/or Residential Mobile Crisis Unit telephone number  Request made of family/significant other to:  Remove weapons (e.g., guns, rifles, knives), all items previously/currently identified as safety concern.    Remove drugs/medications (over-the-counter, prescriptions, illicit drugs), all items previously/currently identified as a safety concern.  Glendon stated she has concerns about Takita's sudden request to be discharged. She reported that after [place] and her boyfriend broke up she stated she was "over him" but then when [place] returned from church she found that [place] had cut herself several times. Glendon stated that in the past sudden changes have been a red flag for her, and that this makes her more concerned about Lexine being discharged today. She went on to say that yesterday at lunch  Jerri said she could not imagine her life without her boyfriend, but by dinner last night she was saying she was over him and wanted to leave. Another patient also reported to Glendon yesterday at lunch that Makaria had a  crying outburst which she said was about the boyfriend.   [name] was receptive to suicide prevention education and reported that [place] has no access to firearms. She stated that [place] does not use substances and that she is not concerned about there being a danger of Lorea hurting ex-boyfriend or him hurting her as the majority of their relationship was over the phone versus face to face. Glendon had no questions about suicide, but does have safety concerns.  Lyn Hollingshead, Lyndee Hensen 04/14/2012, 10:21 AM

## 2012-04-14 NOTE — Progress Notes (Signed)
Patient denies HI/SI, states that she feels much better and wants to go home.  Participating in groups and interacting with others appropriately.

## 2012-04-14 NOTE — Progress Notes (Signed)
Patient seen during during d/c planning group and or treatment team.  She reports being much better and hopes to discharge home soon.  Patient is denying SI/HI.  Patient advised of being very tearful and upset over breakup with boyfriend yesterday but better today.    Per State Regulation 482.30 This chart was reviewed for medical necessity with respect to the patient's  Admission/Duration of Stay  [facility], LCSW @5 /12/2011    Next Review Date 04/17/12

## 2012-04-14 NOTE — Progress Notes (Signed)
[facility] MD Progress Note  04/14/2012 7:32 PM  Diagnosis:  Axis I: Bipolar Disorder Axis II: Borderline Personality Dis.  ADL's:  Intact  Sleep: Good  Appetite:  Good  Suicidal Ideation:  Pt denies any suicidal ideation Homicidal Ideation:  Denies adamantly any homicidal thoughts.  Mental Status Examination/Evaluation: Objective:  Appearance: Casual  Eye Contact::  Good  Speech:  Clear and Coherent  Volume:  Normal  Mood:  Euthymic   Affect:  Congruent  Thought Process:  Coherent  Orientation:  Full  Thought Content:  WDL  Suicidal Thoughts:  No  Homicidal Thoughts:  No  Memory:  Immediate;   Fair  Judgement:  Intact  Insight:  Good  Psychomotor Activity:  Normal  Concentration:  Fair  Recall:  Fair  Akathisia:  No  AIMS (if indicated):     Assets:  Communication Skills Desire for Improvement  Sleep:  Number of Hours: 6.25    ROS: Neuro: no headaches, ataxia, weakness  MS: no weakness, muscle cramps, aches.  GI: no N/V/D/cramps/constipation  Vital Signs:Blood pressure 104/71, pulse 103, temperature 98.3 F (36.8 C), temperature source Oral, resp. rate 20, height 5\' 6"  (1.676 m), weight 103.874 kg (229 lb). Current Medications: Current Facility-Administered Medications  Medication Dose Route Frequency Provider Last Rate Last Dose  . acetaminophen (TYLENOL) tablet 650 mg  650 mg Oral Q6H PRN Nehemiah Settle, MD      . alum & mag hydroxide-simeth (MAALOX/MYLANTA) 200-200-20 MG/5ML suspension 30 mL  30 mL Oral Q4H PRN Nehemiah Settle, MD      . citalopram (CELEXA) tablet 20 mg  20 mg Oral QHS Mike Craze, MD      . lamoTRIgine (LAMICTAL) tablet 150 mg  150 mg Oral Once Nehemiah Settle, MD      . lamoTRIgine (LAMICTAL) tablet 150 mg  150 mg Oral QHS Mike Craze, MD   150 mg at 04/13/12 2134  . magnesium hydroxide (MILK OF MAGNESIA) suspension 30 mL  30 mL Oral Daily PRN Nehemiah Settle, MD      . traZODone (DESYREL) tablet  300 mg  300 mg Oral Once Nehemiah Settle, MD      . traZODone (DESYREL) tablet 300 mg  300 mg Oral QHS Mike Craze, MD   300 mg at 04/13/12 2135  . DISCONTD: Fruit of the Earth/Aloe Vera LOTN 1 application  1 application Apply externally PRN Mike Craze, MD        Lab Results:  Results for orders placed during the hospital encounter of 04/11/12 (from the past 48 hour(s))  PREGNANCY, URINE     Status: Normal   Collection Time   04/13/12  7:48 PM      Component Value Range Comment   Preg Test, Ur NEGATIVE  NEGATIVE    URINALYSIS, ROUTINE W REFLEX MICROSCOPIC     Status: Normal   Collection Time   04/13/12  7:48 PM      Component Value Range Comment   Color, Urine YELLOW  YELLOW     APPearance CLEAR  CLEAR     Specific Gravity, Urine 1.027  1.005 - 1.030     pH 6.0  5.0 - 8.0     Glucose, UA NEGATIVE  NEGATIVE (mg/dL)    Hgb urine dipstick NEGATIVE  NEGATIVE     Bilirubin Urine NEGATIVE  NEGATIVE     Ketones, ur NEGATIVE  NEGATIVE (mg/dL)    Protein, ur NEGATIVE  NEGATIVE (mg/dL)  Urobilinogen, UA 0.2  0.0 - 1.0 (mg/dL)    Nitrite NEGATIVE  NEGATIVE     Leukocytes, UA NEGATIVE  NEGATIVE  MICROSCOPIC NOT DONE ON URINES WITH NEGATIVE PROTEIN, BLOOD, LEUKOCYTES, NITRITE, OR GLUCOSE <1000 mg/dL.    Physical Findings: AIMS:  , ,  ,  ,    CIWA:    COWS:     Treatment Plan Summary: Daily contact with patient to assess and evaluate symptoms and progress in treatment Medication management  Plan: Have checked with her family.  Mother is somewhat concerned about her flipping and flopping quickly.  In treatment team she was confronted about this quick change of attitude particularly when her mother left for church one hour and the next hour came back and pt had cut on her arm. Pt explained that she had had a fight with the boy friend.  Pt was able to outline that she has learned a lot from her roommate and is saying that she would be better off without any one than to live  with someone who tears her down a lot.  Pt describes that she has learned several coping skills like walking, writing, and listening to music.  Ky Rumple 04/14/2012, 7:32 PM

## 2012-04-15 NOTE — Discharge Instructions (Signed)
You may benefit from attending the DBT groups at [facility] during their next cycle this summer.

## 2012-04-15 NOTE — Progress Notes (Signed)
Patient discharged home.  Discharge instructions, follow up appointment, and sample medications given.  Prescriptions sent to patient's pharmacy.  Patient verbalized understanding of instructions.   Items collected from locker.

## 2012-04-15 NOTE — Progress Notes (Signed)
Patient ID: [name], female   DOB: January 29, 1986, 26 y.o.   MRN: [id number]   Patient lying in bed. Appears to be sleeping. Respirations even and non-labored. Staff will monitor on q 15 minute checks.

## 2012-04-15 NOTE — Discharge Summary (Signed)
Physician Discharge Summary Note  Patient:  [name] is an 26 y.o., female MRN:  [id number] DOB:  05/05/1986 Patient phone:  [phone] (home)  Patient address:   [address] [place] [address],   Date of Admission:  04/11/2012 Date of Discharge: 04/15/12  Reason for Admission: Suicidal ideations  Discharge Diagnoses: Active Problems:  Borderline behavior   Axis Diagnosis:   AXIS I:  Bipolar  disorder. AXIS II:  Borderline behavior AXIS III:   Past Medical History  Diagnosis Date  . Bipolar disorder   . Attention deficit hyperactivity disorder   . Depression   . Asthma   . Anxiety    AXIS IV:  other psychosocial or environmental problems and problems related to social environment AXIS V:  65  Level of Care:  OP  Hospital Course: This is a 26 year old Caucasian female, admitted to [facility] as a walk-in admission with complaints of suicidal thoughts with superficial cuts to Lt. Arm areas. Patient reports, "I broke up with my boyfriend on April 2nd of this year. I was with him for almost 1 year. I have not talked with him since the break-up. I miss him so much. We broke-up because he was very controlling. I have depression, but it got worse since the break-up. My depression started after my grandmother passed away few years ago. I have been depressed most of my life. This past Saturday, I got to where I could not help but to miss him that I became suicidal. I started cutting on my left arm. This is not to kill myself, rather, it helps numb my pain".  While a patient in this facility, [name] received medication management as well group counseling. She received Lamictal 150 mg daily for mood control, Citalopram 20 mg daily for signs and symptoms of depression, and Trazodone 300 mg Q hs for sleep. She participated in group counseling sessions and activities. In the first few days of her admission, patient continue to ruminate how much she missed her  boyfriend. However, as she continued on her treatment regimen, patient met and befriended another patient. Patient seem to like her new friend and spend her free time with this patient in the day room talking to each. Patient expressed how much her new friend was helping her put things into perspective.   On 04/14/12, patient attended treatment team meeting and stated that she was not only stable for discharge, that she was over her break-up with boyfriend. Patient was evaluated by the treatment team and decided that her sudden improvement was too quick for someone that was just complaining of suicide and can't live without her boyfriend. She was instructed and encouraged to stay and continue her treatment regimen as staff continue to monitor her mood. Patient also added that she also wanted to go home because she planned on playing bingo with her mother on Wednesday night.  Patient attended treatment team meeting this am. She presented with good mood and affect. She had her hair neatly in braids. She agreed with the treatment team that she is stable for discharge. However, she will continue psychiatric care on outpatient basis at [facility] outpatient clinic with Flint Melter on 04/20/12 for counseling services and with Dr. Lolly Mustache on 04/26/12 for medication management. The address, dates and times for these appointments provided for patient. Patient adamantly denies suicidal, homicidal ideations, auditory, visual hallucinations and or delusional thinking. She was provided with 3 days worth samples of her [facility] discharge medications.  Patient left [facility] with all personal belongings via family transport in no apparent distress.  Consults:  None  Significant Diagnostic Studies:  None  Discharge Vitals:   Blood pressure 111/77, pulse 120, temperature 98.7 F (37.1 C), temperature source Oral, resp. rate 16, height 5\' 6"  (1.676 m), weight 103.874 kg (229 lb).  Mental Status Exam: See Mental Status Examination and  Suicide Risk Assessment completed by Attending Physician prior to discharge.  Discharge destination:  Home  Is patient on multiple antipsychotic therapies at discharge:  No   Has Patient had three or more failed trials of antipsychotic monotherapy by history:  No  Recommended Plan for Multiple Antipsychotic Therapies: NA   Medication List  As of 04/15/2012  3:25 PM   STOP taking these medications         ARIPiprazole 30 MG tablet      methylphenidate 10 MG 24 hr capsule         TAKE these medications      Indication    citalopram 20 MG tablet   Commonly known as: CELEXA   Take 1 tablet (20 mg total) by mouth at bedtime. For depression.       lamoTRIgine 150 MG tablet   Commonly known as: LAMICTAL   Take 1 tablet (150 mg total) by mouth 2 (two) times daily. For mood control.       trazodone 300 MG tablet   Commonly known as: DESYREL   Take 1 tablet (300 mg total) by mouth at bedtime. For insomnia.            Follow-up Information    Follow up with Adelene Amas, PhD on 04/20/2012. (You are scheduled with Adelene Amas, PhD on Tuesday, Apr 20, 2012 at 2:00 p.m.)       Follow up with Dr. Lolly Mustache - [facility] on 04/26/2012. (You are scheduled with Dr. Lolly Mustache on Monday, Apr 26, 2012 at 3:15 p.m.)    Contact information:   [address] [place], [place]  [address]  [phone]         Follow-up recommendations:  Activities as tolerated. Keep all follow-up appointments as recommended.  Comments: Take all your medications as prescribed.. Report any adverse effects from medications to your outpatient provider promptly. Patient is instructed to not drink alcohol and use illegal drugs while on prescription medicines.  SignedArmandina Stammer I 04/15/2012, 3:25 PM

## 2012-04-15 NOTE — Tx Team (Signed)
Interdisciplinary Treatment Plan Update (Adult)  Date:  04/15/2012  Time Reviewed:  10:33 AM   Progress in Treatment: Attending groups: Yes Participating in groups:  Yes Taking medication as prescribed:  Yes Tolerating medication: Yes Family/Significant othe contact made:  Yes, contact made with: mother, [name] Patient understands diagnosis: Yes Discussing patient identified problems/goals with staff:  Yes Medical problems stabilized or resolved: Yes Denies suicidal/homicidal ideation: Yes Issues/concerns per patient self-inventory:  No  Other:  New problem(s) identified: None  Reason for Continuation of Hospitalization: Appropriate for discharge   Interventions implemented related to continuation of hospitalization:  Medication stabilization, safety checks q 15 mins, group attendance  Additional comments:  Estimated length of stay: discharge today  Discharge Plan:  [name] will discharge home with mother and will follow up with [name]  New goal(s):  Review of initial/current patient goals per problem list:   1. Goal(s): Eliminate SI/Other thoughts of self-ham  Met: Yes  Target date: d/c  As evidenced by: [name] is no longer endorsing SI/thoughts of self-harm 2. Goal (s): Reduce depression/anxiety to rating of 4 or less Met: Yes  Target date: d/c  As evidenced by: [name] rates anxiety and depression at 0 3. Goal(s): Stabilize on medications  Met: Yes  Target date: d/c  As evidenced by: [name] reports medications are working with no intolerable side effects    Attendees: Patient:     Family:     Physician:  Dr Orson Aloe, MD 04/15/2012 10:33 AM  Nursing:   Beckie Salts, RN 04/15/2012 10:33 AM  Case Manager:  Richelle Ito, LCSW 04/15/2012 10:33 AM  Counselor:  Angus Palms, LCSW 04/15/2012 10:33 AM  Other:  Reyes Ivan, LCSWA 04/15/2012 10:33 AM  Other:  Serena Colonel, NP 04/15/2012 10:33 AM  Other:  Omelia Blackwater, RN 04/15/2012 10:33 AM  Other:      Scribe  for Treatment Team:   [name], 04/15/2012 10:33 AM

## 2012-04-15 NOTE — Progress Notes (Signed)
[facility] Case Management Discharge Plan:  Will you be returning to the same living situation after discharge: Yes,  home with mother At discharge, do you have transportation home?:Yes,  mother Do you have the ability to pay for your medications:Yes,  medicaid  Interagency Information:     Release of information consent forms completed and in the chart;  Patient's signature needed at discharge.  Patient to Follow up at:  Follow-up Information    Follow up with Adelene Amas, PhD on 04/20/2012. (You are scheduled with Adelene Amas, PhD on Tuesday, Apr 20, 2012 at 2:00 p.m.)       Follow up with Dr. Lolly Mustache - [facility] on 04/26/2012. (You are scheduled with Dr. Lolly Mustache on Monday, Apr 26, 2012 at 3:15 p.m.)    Contact information:   [address] [place], [place]  [address]  [phone]         Patient denies SI/HI:   Yes,  yes    Safety Planning and Suicide Prevention discussed:  Yes,  yes  Barrier to discharge identified:No.  Summary and Recommendations:   Ida Rogue 04/15/2012, 11:44 AM

## 2012-04-18 ENCOUNTER — Encounter (HOSPITAL_COMMUNITY): Payer: Self-pay | Admitting: Emergency Medicine

## 2012-04-18 ENCOUNTER — Emergency Department (HOSPITAL_COMMUNITY)
Admission: EM | Admit: 2012-04-18 | Discharge: 2012-04-18 | Disposition: A | Discharge status: Home / Self Care | Payer: Medicare Other | Attending: Emergency Medicine | Admitting: Emergency Medicine

## 2012-04-18 DIAGNOSIS — F319 Bipolar disorder, unspecified: Secondary | ICD-10-CM | POA: Insufficient documentation

## 2012-04-18 DIAGNOSIS — F410 Panic disorder [episodic paroxysmal anxiety] without agoraphobia: Secondary | ICD-10-CM

## 2012-04-18 DIAGNOSIS — J45909 Unspecified asthma, uncomplicated: Secondary | ICD-10-CM | POA: Insufficient documentation

## 2012-04-18 NOTE — Discharge Instructions (Signed)
Return here as needed.  Follow up with her primary care doctor for recheck

## 2012-04-18 NOTE — ED Notes (Signed)
[id number]> Expected date:<BR> Expected time: 6:08 PM<BR> Means of arrival:<BR> Comments:<BR> M80 - 26yoF [place], asthma, ambulatory

## 2012-04-18 NOTE — ED Notes (Signed)
Lungs clear.  NAD.

## 2012-04-18 NOTE — ED Notes (Signed)
UA and wet prep ordered on wrong person.

## 2012-04-18 NOTE — ED Notes (Signed)
Pt requesting food.  All vitals stable.

## 2012-04-18 NOTE — ED Provider Notes (Signed)
History     CSN: [id number]  Arrival date & time 04/18/12  6213   First MD Initiated Contact with Patient 04/18/12 1942      Chief Complaint  Patient presents with  . Asthma  . Diarrhea    (Consider location/radiation/quality/duration/timing/severity/associated sxs/prior treatment) HPI Patient presents emergency Dept. with an episode of trouble breathing.  She states she was thinking about her ex-boyfriend and she got upset and had trouble breathing.  She says that she thought it might be asthma, and that is why she wanted to be checked out.  The patient has had episodes in the past with wheezing during brain time due to the pollen.  The mother states that she also had an episode similar to this, this morning at church.  The patient says she feels completely resolved at this time from all of her symptoms.  Patient denies chest pain, nausea, vomiting, abdominal pain, back pain, headache, visual changes, hallucinations, suicidal or homicidal ideation. Past Medical History  Diagnosis Date  . Bipolar disorder   . Attention deficit hyperactivity disorder   . Depression   . Asthma   . Anxiety     Past Surgical History  Procedure Date  . Mouth surgery     Family History  Problem Relation Age of Onset  . Depression Mother   . Depression Father   . Depression Brother     History  Substance Use Topics  . Smoking status: Never Smoker   . Smokeless tobacco: Not on file  . Alcohol Use: No    OB History    Grav Para Term Preterm Abortions TAB SAB Ect Mult Living                  Review of Systems All other systems negative except as documented in the HPI. All pertinent positives and negatives as reviewed in the HPI.  Allergies  Divalproex sodium and Methylphenidate derivatives  Home Medications   Current Outpatient Rx  Name Route Sig Dispense Refill  . CITALOPRAM HYDROBROMIDE 20 MG PO TABS Oral Take 1 tablet (20 mg total) by mouth at bedtime. For depression. 30 tablet 0   . LAMOTRIGINE 150 MG PO TABS Oral Take 1 tablet (150 mg total) by mouth 2 (two) times daily. For mood control. 60 tablet 0  . TRAZODONE HCL 300 MG PO TABS Oral Take 1 tablet (300 mg total) by mouth at bedtime. For insomnia. 30 tablet 0    BP 115/68  Pulse 80  Resp 16  SpO2 100%  Physical Exam Physical Examination: General appearance - alert, well appearing, and in no distress, oriented to person, place, and time and overweight Mental status - alert, oriented to person, place, and time Eyes - pupils equal and reactive, extraocular eye movements intact Ears - bilateral TM's and external ear canals normal Nose - normal and patent, no erythema, discharge or polyps Mouth - mucous membranes moist, pharynx normal without lesions Chest - clear to auscultation, no wheezes, rales or rhonchi, symmetric air entry Heart - normal rate, regular rhythm, normal S1, S2, no murmurs, rubs, clicks or gallops  ED Course  Procedures (including critical care time)  The patient has no symptoms of wheezing or shortness of breath in the emergency department his of asthma with her primary care doctor for recheck. told to return here as needed.  Patient is well-appearing and I cannot find a reason to order lab testing or x-rays.   MDM  Carlyle Dolly, PA-C 04/18/12 1955

## 2012-04-18 NOTE — ED Notes (Addendum)
Per EMS: Pt came to the fire dept c/o sob and diarrhea.  States she had an asthma attack but has no hx of asthma.  Was talking on the phone and put her headphones in while EMS was trying to get a hx.  No sob noted.  Was released from [facility] a few days ago.

## 2012-04-20 NOTE — Progress Notes (Signed)
Patient Discharge Instructions:  After Visit Summary (AVS):   Faxed to:  04/19/2012 Face Sheet:   Faxed to:  04/19/2012 Psychiatric Admission Assessment Note:   Faxed to:  04/19/2012 Suicide Risk Assessment - Discharge Assessment:   Faxed to:  04/19/2012 Faxed/Sent to the Next Level Care provider:  04/19/2012 Next Level Care Provider Has Access to the EMR, 04/19/2012  Faxed to Adelene Amas @ [phone] And [facility] O/p Dr. Lolly Mustache has access to emr via [facility]/Epic access.  Wandra Scot, 04/20/2012, 3:27 PM

## 2012-04-20 NOTE — ED Provider Notes (Signed)
Medical screening examination/treatment/procedure(s) were performed by non-physician practitioner and as supervising physician I was immediately available for consultation/collaboration.  Jendayi Berling T Louvenia Golomb, MD 04/20/12 0604 

## 2012-04-20 NOTE — Discharge Summary (Signed)
I agree with this D/C Summary.  

## 2012-04-26 ENCOUNTER — Encounter (HOSPITAL_COMMUNITY): Payer: Self-pay | Admitting: Psychiatry

## 2012-04-26 ENCOUNTER — Ambulatory Visit (INDEPENDENT_AMBULATORY_CARE_PROVIDER_SITE_OTHER): Payer: Medicare Other | Admitting: Psychiatry

## 2012-04-26 VITALS — Wt 224.0 lb

## 2012-04-26 DIAGNOSIS — F319 Bipolar disorder, unspecified: Secondary | ICD-10-CM

## 2012-04-26 DIAGNOSIS — F988 Other specified behavioral and emotional disorders with onset usually occurring in childhood and adolescence: Secondary | ICD-10-CM

## 2012-04-26 MED ORDER — CITALOPRAM HYDROBROMIDE 20 MG PO TABS: 20.0000 mg | ORAL_TABLET | Freq: Every day | ORAL | Status: DC

## 2012-04-26 NOTE — Progress Notes (Signed)
Chief complaint I was [facility]    History of present illness. Patient is 26 year old Caucasian female who came for her followup appointment.  [facility] was [facility] after she experiencing increased depression and having suicidal thoughts.  Prior to that patient has superficial cuts on her arm.  She was brought up with her boyfriend and she was missing him.  Patient endorse poor sleep and agitation and felt that she need to talk to someone and then she went to ER she was admitted.  In the hospital her Abilify and Ritalin was discontinued and she was started on Celexa.  She was continued on Lamictal.  She is doing much better on her Celexa.  Her mother endorse that her mood has been improved from the past she is sleeping better.  She denies any agitation or anger.  She's actually lost some weight since Abilify has been discontinued.  She is still taking trazodone 300 mg at bedtime.  She does not want to change her medication.  Since [facility] she has not engaged in any self abusive behavior.  Current psychiatric medication Celexa 20 mg daily  Lamictal 150 mg twice a day Trazodone 300 mg at bedtime.    Past psychiatric history Patient has a long history of impulsive behavior with multiple inpatient psychiatric treatment. She has history of suicidal attempt, cutting herself and aggressive behavior. She had history of treatment with Prozac Depakote Klonopin Concerta and Strattera.  She was [facility] due to suicidal thinking.  Psychosocial history Patient lives with her mother. She has no children. She was involved in very intense relationship which ended yesterday.  Medical history Obesity  Mental status examination Patient is mildly obese female who is casually dressed and fairly groomed.  She is pleasant and cooperative.  Her attention and concentration remains  distracted however she is relevant in conversation.  She denies any active or passive suicidal thinking and homicidal thinking.  She denies any auditory or visual hallucination.  She has some thought blocking but overall she denies any obsessions or psychosis.  Her speech is fast but relevant.  She denies any paranoid thinking at this time.  There were no tremors or shakes.  She's alert and oriented x3.  Her insight judgment and impulse control is fair.  Assessment Axis I bipolar disorder, ADD Axis II borderline intellect Axis III see medical history Axis IV moderate Axis V 50-55  Plan I reviewed recent discharge summary, medication response, collateral information and blood results.  She is not taking Abilify and Ritalin.  I will continue Celexa, Lamictal, trazodone at present does.  Patient denies any side effects of medication including any rash.  Patient and her mother is happy that she is doing better on Celexa.  She is able to lose some weight in past 4 weeks.  I will see her again in 4 weeks.  I recommend to call us if she feels any question or concern about the medication or started to have depressive symptoms.  I talk about safety plan that in case she start having any suicidal thoughts or homicidal thoughts or any self abusive behavior and she need to call 911 or go to local emergency room.  Time spent 30 minutes.

## 2012-05-14 ENCOUNTER — Encounter (HOSPITAL_COMMUNITY): Payer: Self-pay | Admitting: Emergency Medicine

## 2012-05-14 ENCOUNTER — Telehealth: Payer: Self-pay | Admitting: *Deleted

## 2012-05-14 ENCOUNTER — Emergency Department (HOSPITAL_COMMUNITY)
Admission: EM | Admit: 2012-05-14 | Discharge: 2012-05-14 | Discharge status: Against Medical Advice | Payer: Medicare Other | Attending: Emergency Medicine | Admitting: Emergency Medicine

## 2012-05-14 DIAGNOSIS — J029 Acute pharyngitis, unspecified: Secondary | ICD-10-CM | POA: Insufficient documentation

## 2012-05-14 DIAGNOSIS — R1110 Vomiting, unspecified: Secondary | ICD-10-CM | POA: Insufficient documentation

## 2012-05-14 NOTE — ED Notes (Signed)
Pt states she feels like she has heat stroke.  Has been vomiting x 1 week, h/a, shaky, abd pain.

## 2012-05-14 NOTE — ED Notes (Signed)
Patient adds that she has intermittent dizziness

## 2012-05-14 NOTE — ED Notes (Signed)
Patient states that she has symptoms of "heat stroke".  Patient describes her symptoms as sore throat, vomiting and stomach cramps x 2 weeks.

## 2012-05-14 NOTE — Telephone Encounter (Signed)
Mother of pt called for past  week problems with dizziness, vomiting and yellow-orange productive cough. Called at 2:20PM - no opening left for today. Suggest to go to ER or Urgent Care. Stanton Kidney Margree Gimbel RN 5/31 13 2:25PM

## 2012-05-15 ENCOUNTER — Encounter (HOSPITAL_COMMUNITY): Payer: Self-pay | Admitting: Emergency Medicine

## 2012-05-15 ENCOUNTER — Emergency Department (HOSPITAL_COMMUNITY)
Admission: EM | Admit: 2012-05-15 | Discharge: 2012-05-16 | Disposition: A | Discharge status: Home / Self Care | Payer: Medicare Other | Attending: Emergency Medicine | Admitting: Emergency Medicine

## 2012-05-15 DIAGNOSIS — R1011 Right upper quadrant pain: Secondary | ICD-10-CM | POA: Insufficient documentation

## 2012-05-15 DIAGNOSIS — R1110 Vomiting, unspecified: Secondary | ICD-10-CM | POA: Insufficient documentation

## 2012-05-15 NOTE — ED Notes (Signed)
Pt c/o vomiting and abdominal pain that began 2 weeks ago. Pt states it became worse 2 days ago and she has been unable to keep down any food. Pt also c/o burning chest pain that began yesterday.

## 2012-05-15 NOTE — ED Notes (Signed)
Pt c/o vomiting, RUQ abdominal pain, pain in chest w/ vomiting and headache w/ vomiting. Pt states this has been persistent x 2 weeks. Pt has not been seen for these multiple complaints.

## 2012-05-16 NOTE — ED Notes (Signed)
Pt called multiple times, not visualized in WR

## 2012-05-17 ENCOUNTER — Encounter: Payer: Self-pay | Admitting: Internal Medicine

## 2012-05-17 ENCOUNTER — Other Ambulatory Visit (HOSPITAL_COMMUNITY)
Admission: RE | Admit: 2012-05-17 | Discharge: 2012-05-17 | Disposition: A | Discharge status: Home / Self Care | Payer: Medicare Other | Source: Ambulatory Visit | Attending: Internal Medicine | Admitting: Internal Medicine

## 2012-05-17 ENCOUNTER — Ambulatory Visit (INDEPENDENT_AMBULATORY_CARE_PROVIDER_SITE_OTHER): Payer: Medicare Other | Admitting: Internal Medicine

## 2012-05-17 ENCOUNTER — Telehealth: Payer: Self-pay | Admitting: *Deleted

## 2012-05-17 VITALS — BP 109/68 | HR 78 | Temp 98.0°F | Ht 66.0 in | Wt 221.6 lb

## 2012-05-17 DIAGNOSIS — R059 Cough, unspecified: Secondary | ICD-10-CM | POA: Insufficient documentation

## 2012-05-17 DIAGNOSIS — R05 Cough: Secondary | ICD-10-CM

## 2012-05-17 DIAGNOSIS — Z113 Encounter for screening for infections with a predominantly sexual mode of transmission: Secondary | ICD-10-CM | POA: Insufficient documentation

## 2012-05-17 DIAGNOSIS — R1110 Vomiting, unspecified: Secondary | ICD-10-CM | POA: Insufficient documentation

## 2012-05-17 DIAGNOSIS — R300 Dysuria: Secondary | ICD-10-CM

## 2012-05-17 LAB — BASIC METABOLIC PANEL WITH GFR
BUN: 9 mg/dL (ref 6–23)
CO2: 26 mEq/L (ref 19–32)
Calcium: 9.7 mg/dL (ref 8.4–10.5)
Chloride: 105 mEq/L (ref 96–112)
Creat: 1.08 mg/dL (ref 0.50–1.10)
GFR, Est African American: 82 mL/min
GFR, Est Non African American: 72 mL/min
Glucose, Bld: 93 mg/dL (ref 70–99)
Potassium: 4.8 mEq/L (ref 3.5–5.3)
Sodium: 141 mEq/L (ref 135–145)

## 2012-05-17 LAB — POCT URINALYSIS DIPSTICK
Glucose, UA: NEGATIVE
Ketones, UA: NEGATIVE
Leukocytes, UA: NEGATIVE
Nitrite, UA: NEGATIVE
Protein, UA: 100
Spec Grav, UA: 1.025
Urobilinogen, UA: 1
pH, UA: 5.5

## 2012-05-17 LAB — POCT URINE PREGNANCY: Preg Test, Ur: NEGATIVE

## 2012-05-17 MED ORDER — GUAIFENESIN-CODEINE 100-10 MG/5ML PO SYRP: 5.0000 mL | ORAL_SOLUTION | Freq: Three times a day (TID) | ORAL | Status: DC | PRN

## 2012-05-17 MED ORDER — ALBUTEROL SULFATE HFA 108 (90 BASE) MCG/ACT IN AERS: 2.0000 | INHALATION_SPRAY | Freq: Four times a day (QID) | RESPIRATORY_TRACT | Status: DC | PRN

## 2012-05-17 MED ORDER — PANTOPRAZOLE SODIUM 40 MG PO TBEC: 40.0000 mg | DELAYED_RELEASE_TABLET | Freq: Every day | ORAL | Status: DC

## 2012-05-17 NOTE — Assessment & Plan Note (Signed)
Etiology uncertain.  Given sore throat preceding, likely postviral cough.  Given history of needing albuterol in past, she may have undiagnosed reactive airway disease or asthma that has been exacerbated.  GERD could also be contributing.   -Prescribed Protonix that she has been on in past.  Omeprazole contraindicated because of interaction with psych med -Albuterol PRN.  Take 3x per day scheduled for 4 days to help with cough -Codeine-guaifenesin cough syrup

## 2012-05-17 NOTE — Progress Notes (Signed)
Subjective:   Patient ID: [name] female   DOB: 1986/01/16 26 y.o.   MRN: [id number]  HPI: [name] is a 26 y.o. with bipolar disorder who presents with two weeks of cough gagging and vomiting.  Started with sore throat two weeks ago that resolved but persistent cough that comes in spells persists.  Spells cause gagging which leads to vomiting.  No vomiting without preceding coughing spell.  However, she does have some nausea as well.  She is tolerating good PO food and fluid intake.  She reports history of GERD and used to be on protonix.  She also has been prescribed albuterol 3 times in her life but does not have diagnosis of asthma.  Mom smokes in the home.  She does report loose stools past two days, two bowel movements per day total.  She has had some dysuria today.  No constipation.  No blood in stools or urine.  Reports fever to 101 F two days ago at home but none yesterday.  She has implanted contraceptive.  No sexual activity recently but has been sexually active in the past.    She did feel a little lightheaded a couple days ago when she tripped down the stairs and landed on her hands and knees.  No lightheadedness otherwise.    Past Medical History  Diagnosis Date  . Bipolar disorder   . Attention deficit hyperactivity disorder   . Depression   . Anxiety   . Asthma    Current Outpatient Prescriptions  Medication Sig Dispense Refill  . albuterol (PROVENTIL HFA;VENTOLIN HFA) 108 (90 BASE) MCG/ACT inhaler Inhale 2 puffs into the lungs every 6 (six) hours as needed for wheezing.  1 Inhaler  0  . citalopram (CELEXA) 20 MG tablet Take 1 tablet (20 mg total) by mouth at bedtime. For depression.  30 tablet  0  . guaiFENesin-codeine (ROBITUSSIN AC) 100-10 MG/5ML syrup Take 5 mLs by mouth 3 (three) times daily as needed for cough.  120 mL  0  . lamoTRIgine (LAMICTAL) 150 MG tablet Take 1 tablet (150 mg total) by mouth 2 (two) times daily. For mood control.  60  tablet  0  . Menthol (COUGH DROPS) 10 MG LOZG Use as directed 1 lozenge in the mouth or throat.      . pantoprazole (PROTONIX) 40 MG tablet Take 1 tablet (40 mg total) by mouth daily.  31 tablet  0  . trazodone (DESYREL) 300 MG tablet Take 1 tablet (300 mg total) by mouth at bedtime. For insomnia.  30 tablet  0   Family History  Problem Relation Age of Onset  . Depression Mother   . Depression Father   . Depression Brother    History   Social History  . Marital Status: Single    Spouse Name: N/A    Number of Children: N/A  . Years of Education: N/A   Social History Main Topics  . Smoking status: Never Smoker   . Smokeless tobacco: None  . Alcohol Use: No  . Drug Use: No  . Sexually Active: Yes    Birth Control/ Protection: None   Other Topics Concern  . None   Social History Narrative  . None   Review of Systems: Constitutional: Denies diaphoresis, appetite change and fatigue.  Respiratory: Denies SOB, DOE, cough, chest tightness,  and wheezing.   Cardiovascular: Denies chest pain, palpitations and leg swelling.  Genitourinary: Denies urgency, frequency, hematuria, flank pain and difficulty urinating.  Skin: Denies pallor, rash and wound.  Neurological: Denies dizziness, seizures, syncope, weakness, numbness and headaches.    Objective:  Physical Exam: Filed Vitals:   05/17/12 1141  BP: 109/68  Pulse: 78  Temp: 98 F (36.7 C)  TempSrc: Oral  Height: 5\' 6"  (1.676 m)  Weight: 221 lb 9.6 oz (100.517 kg)   Constitutional: Vital signs reviewed.  Patient is a well-developed and well-nourished female in no acute distress and cooperative with exam. Alert and oriented x3.  Head: Normocephalic and atraumatic Mouth: no erythema or exudates, MMM Eyes: PERRL, EOMI, conjunctivae normal, No scleral icterus.  Neck: No JVD Cardiovascular: RRR, S1 normal, S2 normal, no MRG, pulses symmetric and intact bilaterally Pulmonary/Chest: CTAB, no wheezes, rales, or  rhonchi  Abdominal: Soft. Diffuse mild subjective tenderness.  Non-distended, bowel sounds are normal, no masses, organomegaly, or guarding present.   Musculoskeletal: No joint deformities, erythema, or stiffness, ROM full and no nontender  Neurological: A&O x3, Strength is normal and symmetric bilaterally, cranial nerve II-XII are grossly intact, no focal motor deficit, sensory intact to light touch bilaterally.  Skin: Warm, dry and intact. No rash, cyanosis, or clubbing.    Assessment & Plan:

## 2012-05-17 NOTE — Telephone Encounter (Signed)
Pt called never went to ER - denies temp, has orange productive cough and vomiting. Appt given this AM 05/17/12 11:15AM Dr Larey Seat. Stanton Kidney Georgena Weisheit RN 05/17/12 9:10AM

## 2012-05-17 NOTE — Patient Instructions (Signed)
Please use albuterol inhaler 3x per day for 4 days.  If your symptoms do not improve in two weeks please call the clinic.

## 2012-05-17 NOTE — Assessment & Plan Note (Signed)
Most likely seems to be due to GERD and coughing spells causing gagging -> vomiting.  However, may also be viral etiology.  Although she reports good PO intake and tolerating a lot of food and fluids, will check bmet to make sure she does not seem dehydrated and potassium ok.  Will treat cough as per below to try to limit gagging.  Will restart protonix which she was on in past.  Omeprazole contraindicated due to interaction to another med she is on, so if this needs preauthorization she should qualify.

## 2012-05-17 NOTE — Assessment & Plan Note (Signed)
For past day or two.  Uncertain etiology.  Given her abdominal tenderness and vomiting, will check GC/Chla to rule out PID, which seems like an unlikely diagnosis.  Upon getting urine sample it appeared quite dark, so UA dipstick sent as well

## 2012-05-19 ENCOUNTER — Encounter: Payer: Self-pay | Admitting: Internal Medicine

## 2012-05-19 ENCOUNTER — Telehealth: Payer: Self-pay | Admitting: *Deleted

## 2012-05-19 ENCOUNTER — Ambulatory Visit (INDEPENDENT_AMBULATORY_CARE_PROVIDER_SITE_OTHER): Payer: Medicare Other | Admitting: Internal Medicine

## 2012-05-19 VITALS — BP 110/71 | HR 84 | Temp 98.4°F | Wt 223.3 lb

## 2012-05-19 DIAGNOSIS — R059 Cough, unspecified: Secondary | ICD-10-CM

## 2012-05-19 DIAGNOSIS — R05 Cough: Secondary | ICD-10-CM

## 2012-05-19 DIAGNOSIS — R42 Dizziness and giddiness: Secondary | ICD-10-CM

## 2012-05-19 DIAGNOSIS — K219 Gastro-esophageal reflux disease without esophagitis: Secondary | ICD-10-CM | POA: Insufficient documentation

## 2012-05-19 NOTE — Telephone Encounter (Signed)
Mother called - pt is worse with dizziness - walking into walls this AM. Appt made for today. Stanton Kidney Lazlo Tunney RN 05/19/12 8:50AM

## 2012-05-19 NOTE — Assessment & Plan Note (Addendum)
Patient is still complaining of cough. Albuterol and cough suppressants have not been able to help. Patient is yet to refill her Protonix prescription which makes me think that her cough is most likely related to her reflux. I discussed options with the patient and came to the conclusion of prescribing her Zantac which is available for $4. This way the patient does not have to wait for insurance and should be able to get at least some relief. I will prescribe her Zantac 150 mg twice a day till her insurance approves the prescription for Protonix. Patient's reflux symptoms very severe and she cannot be tried on omeprazole at this time due to interaction with her other antipsychotics. I would discontinue albuterol at this time.

## 2012-05-19 NOTE — Patient Instructions (Signed)
Dehydration, Adult Dehydration is when you lose more fluids from the body than you take in. Vital organs like the kidneys, brain, and heart cannot function without a proper amount of fluids and salt. Any loss of fluids from the body can cause dehydration.  CAUSES   Vomiting.   Diarrhea.   Excessive sweating.   Excessive urine output.   Fever.  SYMPTOMS  Mild dehydration  Thirst.   Dry lips.   Slightly dry mouth.  Moderate dehydration  Very dry mouth.   Sunken eyes.   Skin does not bounce back quickly when lightly pinched and released.   Dark urine and decreased urine production.   Decreased tear production.   Headache.  Severe dehydration  Very dry mouth.   Extreme thirst.   Rapid, weak pulse (more than 100 beats per minute at rest).   Cold hands and feet.   Not able to sweat in spite of heat and temperature.   Rapid breathing.   Blue lips.   Confusion and lethargy.   Difficulty being awakened.   Minimal urine production.   No tears.  DIAGNOSIS  Your caregiver will diagnose dehydration based on your symptoms and your exam. Blood and urine tests will help confirm the diagnosis. The diagnostic evaluation should also identify the cause of dehydration. TREATMENT  Treatment of mild or moderate dehydration can often be done at home by increasing the amount of fluids that you drink. It is best to drink small amounts of fluid more often. Drinking too much at one time can make vomiting worse. Refer to the home care instructions below. Severe dehydration needs to be treated at the hospital where you will probably be given intravenous (IV) fluids that contain water and electrolytes. HOME CARE INSTRUCTIONS   Ask your caregiver about specific rehydration instructions.   Drink enough fluids to keep your urine clear or pale yellow.   Drink small amounts frequently if you have nausea and vomiting.   Eat as you normally do.   Avoid:   Foods or drinks high in  sugar.   Carbonated drinks.   Juice.   Extremely hot or cold fluids.   Drinks with caffeine.   Fatty, greasy foods.   Alcohol.   Tobacco.   Overeating.   Gelatin desserts.   Wash your hands well to avoid spreading bacteria and viruses.   Only take over-the-counter or prescription medicines for pain, discomfort, or fever as directed by your caregiver.   Ask your caregiver if you should continue all prescribed and over-the-counter medicines.   Keep all follow-up appointments with your caregiver.  SEEK MEDICAL CARE IF:  You have abdominal pain and it increases or stays in one area (localizes).   You have a rash, stiff neck, or severe headache.   You are irritable, sleepy, or difficult to awaken.   You are weak, dizzy, or extremely thirsty.  SEEK IMMEDIATE MEDICAL CARE IF:   You are unable to keep fluids down or you get worse despite treatment.   You have frequent episodes of vomiting or diarrhea.   You have blood or green matter (bile) in your vomit.   You have blood in your stool or your stool looks black and tarry.   You have not urinated in 6 to 8 hours, or you have only urinated a small amount of very dark urine.   You have a fever.   You faint.  MAKE SURE YOU:   Understand these instructions.   Will watch your condition.     Will get help right away if you are not doing well or get worse.  Document Released: 12/01/2005 Document Revised: 11/20/2011 Document Reviewed: 07/21/2011 Exit[employer] Patient Information 2012 [employer]. 

## 2012-05-19 NOTE — Assessment & Plan Note (Signed)
Patient is complaining of some loose stools on Monday which have not resolved. Her fluid intake is questionable. Orthostatic vitals suggest her heart rate jumped more than 20 which suggests some dehydration. I suggested IV fluids in the clinic and reassessment after an hour but patient is not willing to get IV fluids. I explained the risks. Patient is adamant that she will go home and drink plenty of fluids.  I explained the situation to her mom who was accompanying the patient and will make sure that she drinks at least 5-6 bottles a 1 L fluid when she gets back home. Patient was also advised to come back to the clinic or to the ER if she feels worse, has intractable vomiting, diarrhea gets worse or dizziness is not improved. Patient and her mom are agreeable to the plan.

## 2012-05-19 NOTE — Progress Notes (Signed)
  Subjective:    Patient ID: [name], female    DOB: 28-Mar-1986, 26 y.o.   MRN: [id number]  HPI  Patient is a 26 year old female with past medical history most significant for bipolar disorder who is here today for multiple complaints but most significant for dizziness.  The patient describes dizziness as a feeling of about to fall whenever she is changing position.  Patient was seen in the clinic on Monday by Dr. Yaakov Guthrie with complaints of loose stools and dysuria with some abdominal pain.  Patient states that her loose stools have resolved completely and she has had only 2 bowel movements in last 2 days. Patient also states that her abdominal cramps and pain have completely resolved. She does not have any difficulty during urination.   Patient still complains of cough with gagging sensation and states that albuterol, Robitussin has not helped. Patient has not refilled her prescription for protonix as yet due to insurance issues. She complains of reflux symptoms.  No other complaints at this time.  Review of Systems  Constitutional: Negative for fever, activity change and appetite change.  HENT: Negative for sore throat.   Respiratory: Positive for cough and shortness of breath.   Cardiovascular: Negative for chest pain and leg swelling.  Gastrointestinal: Negative for nausea, abdominal pain, diarrhea, constipation and abdominal distention.  Genitourinary: Negative for frequency, hematuria and difficulty urinating.  Neurological: Positive for dizziness. Negative for headaches.  Psychiatric/Behavioral: Negative for suicidal ideas and behavioral problems.       Objective:   Physical Exam  Constitutional: She is oriented to person, place, and time. She appears well-developed and well-nourished.  HENT:  Head: Normocephalic and atraumatic.  Eyes: Conjunctivae and EOM are normal. Pupils are equal, round, and reactive to light. No scleral icterus.  Neck: Normal range of motion.  Neck supple. No JVD present. No thyromegaly present.  Cardiovascular: Normal rate, regular rhythm, normal heart sounds and intact distal pulses.  Exam reveals no gallop and no friction rub.   No murmur heard. Pulmonary/Chest: Effort normal and breath sounds normal. No respiratory distress. She has no wheezes. She has no rales.  Abdominal: Soft. Bowel sounds are normal. She exhibits no distension and no mass. There is no tenderness. There is no rebound and no guarding.  Musculoskeletal: Normal range of motion. She exhibits no edema and no tenderness.  Lymphadenopathy:    She has no cervical adenopathy.  Neurological: She is alert and oriented to person, place, and time.  Psychiatric: She has a normal mood and affect. Her behavior is normal.          Assessment & Plan:

## 2012-05-20 ENCOUNTER — Encounter (HOSPITAL_COMMUNITY): Payer: Self-pay | Admitting: *Deleted

## 2012-05-20 ENCOUNTER — Emergency Department (HOSPITAL_COMMUNITY)
Admission: EM | Admit: 2012-05-20 | Discharge: 2012-05-20 | Disposition: A | Discharge status: Home / Self Care | Payer: Medicare Other | Attending: Emergency Medicine | Admitting: Emergency Medicine

## 2012-05-20 ENCOUNTER — Other Ambulatory Visit: Payer: Self-pay

## 2012-05-20 DIAGNOSIS — F909 Attention-deficit hyperactivity disorder, unspecified type: Secondary | ICD-10-CM | POA: Insufficient documentation

## 2012-05-20 DIAGNOSIS — R42 Dizziness and giddiness: Secondary | ICD-10-CM | POA: Insufficient documentation

## 2012-05-20 DIAGNOSIS — R059 Cough, unspecified: Secondary | ICD-10-CM

## 2012-05-20 DIAGNOSIS — R51 Headache: Secondary | ICD-10-CM | POA: Insufficient documentation

## 2012-05-20 DIAGNOSIS — R05 Cough: Secondary | ICD-10-CM

## 2012-05-20 DIAGNOSIS — R0602 Shortness of breath: Secondary | ICD-10-CM

## 2012-05-20 DIAGNOSIS — F319 Bipolar disorder, unspecified: Secondary | ICD-10-CM | POA: Insufficient documentation

## 2012-05-20 MED ORDER — IBUPROFEN 800 MG PO TABS
800.0000 mg | ORAL_TABLET | Freq: Once | ORAL | Status: AC
  Administered 2012-05-20: 800 mg via ORAL
  Filled 2012-05-20: qty 1

## 2012-05-20 MED ORDER — AZITHROMYCIN 250 MG PO TABS: 250.0000 mg | ORAL_TABLET | Freq: Every day | ORAL | Status: AC

## 2012-05-20 NOTE — ED Provider Notes (Addendum)
History     CSN: [id number]  Arrival date & time 05/20/12  9562   First MD Initiated Contact with Patient 05/20/12 [phone]      Chief Complaint  Patient presents with  . Pain  . Dizziness  . Headache    (Consider location/radiation/quality/duration/timing/severity/associated sxs/prior treatment) HPI  26yoF h/o asthma, bipolar disease pw multiple complaints. She states that for the past 2 weeks she has experience productive cough which has gotten worse in the last 2 days. She also complains of intermittent chest tightness. She has no wheezing and does not feel like this is her asthma. She is seeing her primary care provider is x2 as an outpatient and initially prescribed albuterol which was discontinued and her symptoms thought to be secondary to worsening GERD and she was prescribed Zantac. She also had abd/n/diarrhea at that time which has resolved. SHe states that her URI like  symptoms have not improved. She denies shortness of breath at this time. She denies fevers, chills. She does complain of mild sore throat a few days ago which is resolved she's also had rhinorrhea nasal congestion. This morning she woke up with a right-sided temporal headache which is a 5/10 at this time. There've been any changes in her vision. She also had lightheadedness which is resolved. She was transported by EMS. She did not try anything for pain before she came to the emergency department.    ED Notes, ED Provider Notes from 05/20/12 0000 to 05/20/12 08:34:54       Christene Lye, RN 05/20/2012 08:33      Pt in by ems with multiple complaints. Generalized chronic pain, dizziness, HA x2 weeks. Reports she was seen at PCP yesterday, no new Rx. Has not tried OTC meds. Ambulatory on scene, and to and from ambulance. Ems reports pt was talking and texting on cell phone during transport. VSS.      Past Medical History  Diagnosis Date  . Bipolar disorder   . Attention deficit hyperactivity disorder   .  Depression   . Anxiety   . Asthma     Past Surgical History  Procedure Date  . Mouth surgery     Family History  Problem Relation Age of Onset  . Depression Mother   . Depression Father   . Depression Brother     History  Substance Use Topics  . Smoking status: Never Smoker   . Smokeless tobacco: Not on file  . Alcohol Use: No    OB History    Grav Para Term Preterm Abortions TAB SAB Ect Mult Living                  Review of Systems  All other systems reviewed and are negative.   except as noted HPI    Allergies  Divalproex sodium and Methylphenidate derivatives  Home Medications   Current Outpatient Rx  Name Route Sig Dispense Refill  . CITALOPRAM HYDROBROMIDE 20 MG PO TABS Oral Take 1 tablet (20 mg total) by mouth at bedtime. For depression. 30 tablet 0  . LAMOTRIGINE 150 MG PO TABS Oral Take 1 tablet (150 mg total) by mouth 2 (two) times daily. For mood control. 60 tablet 0  . PANTOPRAZOLE SODIUM 40 MG PO TBEC Oral Take 1 tablet (40 mg total) by mouth daily. 31 tablet 0  . TRAZODONE HCL 300 MG PO TABS Oral Take 1 tablet (300 mg total) by mouth at bedtime. For insomnia. 30 tablet 0  . AZITHROMYCIN 250  MG PO TABS Oral Take 1 tablet (250 mg total) by mouth daily. Take first 2 tablets together, then 1 every day until finished. 6 tablet 0    BP 116/58  Pulse 78  Temp(Src) 98.2 F (36.8 C) (Oral)  Resp 14  SpO2 98%  Physical Exam  Nursing note and vitals reviewed. Constitutional: She is oriented to person, place, and time. She appears well-developed.  HENT:  Head: Atraumatic.  Mouth/Throat: Oropharynx is clear and moist.  Eyes: Conjunctivae and EOM are normal. Pupils are equal, round, and reactive to light.  Neck: Normal range of motion. Neck supple.  Cardiovascular: Normal rate, regular rhythm, normal heart sounds and intact distal pulses.   Pulmonary/Chest: Effort normal and breath sounds normal. No respiratory distress. She has no wheezes. She has  no rales.  Abdominal: Soft. She exhibits no distension. There is no tenderness. There is no rebound and no guarding.  Musculoskeletal: Normal range of motion.  Neurological: She is alert and oriented to person, place, and time. No cranial nerve deficit. She exhibits normal muscle tone. Coordination normal.  Skin: Skin is warm and dry. No rash noted.  Psychiatric: She has a normal mood and affect.    Date: 06/22/2012  Rate: 64  Rhythm: normal sinus rhythm  QRS Axis: right  Intervals: normal  ST/T Wave abnormalities: nonspecific T wave changes  Conduction Disutrbances:none  Narrative Interpretation:   Old EKG Reviewed: none available    ED Course  Procedures (including critical care time)  Labs Reviewed - No data to display No results found.   1. Cough   2. Headache   3. Dizziness   4. Shortness of breath     MDM  Well-appearing patient who is well-hydrated. She presents with multiple complaints today most of which are nonspecific and I think are related to viral illness. She's refusing chest x-ray. She'll be prescribed azithromycin she can continue her albuterol as needed and over-the-counter medications for pain. Even before her complete initial evaluation in the emergency department she stated that she was ready to go home, talking on her cellular phone, and repeatedly asking how long for her to go home. Amulatory without dizziness. No EMC precluding discharge at this time. Given Precautions for return. PMD f/u.         Forbes Cellar, MD 05/20/12 9629  Forbes Cellar, MD 05/20/12 5284  Forbes Cellar, MD 06/22/12 1143

## 2012-05-20 NOTE — ED Notes (Signed)
Pt ambulated down the hall and back to room. No SOB or weakness noted.

## 2012-05-20 NOTE — Discharge Instructions (Signed)
Cough, Adult  A cough is a reflex that helps clear your throat and airways. It can help heal the body or may be a reaction to an irritated airway. A cough may only last 2 or 3 weeks (acute) or may last more than 8 weeks (chronic).  CAUSES Acute cough:  Viral or bacterial infections.  Chronic cough:  Infections.   Allergies.   Asthma.   Post-nasal drip.   Smoking.   Heartburn or acid reflux.   Some medicines.   Chronic lung problems (COPD).   Cancer.  SYMPTOMS   Cough.   Fever.   Chest pain.   Increased breathing rate.   High-pitched whistling sound when breathing (wheezing).   Colored mucus that you cough up (sputum).  TREATMENT   A bacterial cough may be treated with antibiotic medicine.   A viral cough must run its course and will not respond to antibiotics.   Your caregiver may recommend other treatments if you have a chronic cough.  HOME CARE INSTRUCTIONS   Only take over-the-counter or prescription medicines for pain, discomfort, or fever as directed by your caregiver. Use cough suppressants only as directed by your caregiver.   Use a cold steam vaporizer or humidifier in your bedroom or home to help loosen secretions.   Sleep in a semi-upright position if your cough is worse at night.   Rest as needed.   Stop smoking if you smoke.  SEEK IMMEDIATE MEDICAL CARE IF:   You have pus in your sputum.   Your cough starts to worsen.   You cannot control your cough with suppressants and are losing sleep.   You begin coughing up blood.   You have difficulty breathing.   You develop pain which is getting worse or is uncontrolled with medicine.   You have a fever.  MAKE SURE YOU:   Understand these instructions.   Will watch your condition.   Will get help right away if you are not doing well or get worse.  Document Released: 05/30/2011 Document Revised: 11/20/2011 Document Reviewed: 05/30/2011 [facility] Patient Information 2012 [place],  [place].  RESOURCE GUIDE  Dental Problems  Patients with Medicaid: [employer] [id number]                                           [phone] [address] Phone:  [phone]                                                   Phone:  [phone]  If unable to pay or uninsured, contact:  [facility] or [facility]. to become qualified for the adult dental clinic.  Chronic Pain Problems Contact [facility]  [phone] Patients need to be referred by their primary care doctor.  Insufficient Money for Medicine Contact [employer]:  call "211" or [facility] [phone].  No Primary Care Doctor Call [facility]  [phone] Other agencies that provide inexpensive medical care    [facility]  [phone]    [facility]  [phone]  [facility]  [phone]    [facility]  [phone]    [facility]  [phone]    [facility] [place] [facility]  [phone]  Psychological Services [facility]  [phone] [facility]  [phone] [facility]   [phone] (emergency services [phone])  Abuse/Neglect [facility] Child Abuse Hotline 3472746754 [facility] Child Abuse Hotline 774-540-3212 (After Hours)  Emergency Shelter [facility] [phone]  [facility] [place] [facility] [place] [phone] [facility] [phone]  MRSA Hotline #:   604 482 6923    [facility]  [facility] [place]  [employer]                           [facility] Dept. [address] [place]                     [address]         [address]  Blondell Reveal Phone:  [phone]                                  Phone:  [phone]                   Phone:   [phone]  [facility] Phone:  [phone]  [facility] Child Abuse Hotline [phone] [phone] (After Hours)

## 2012-05-20 NOTE — ED Notes (Signed)
Pt in by ems with multiple complaints. Generalized chronic pain, dizziness, HA x2 weeks. Reports she was seen at PCP yesterday, no new Rx. Has not tried OTC meds. Ambulatory on scene, and to and from ambulance. Ems reports pt was talking and texting on cell phone during transport. VSS.

## 2012-05-24 ENCOUNTER — Ambulatory Visit (INDEPENDENT_AMBULATORY_CARE_PROVIDER_SITE_OTHER): Payer: Medicare Other | Admitting: Psychiatry

## 2012-05-24 ENCOUNTER — Encounter (HOSPITAL_COMMUNITY): Payer: Self-pay | Admitting: Psychiatry

## 2012-05-24 VITALS — BP 116/69 | HR 60 | Wt 222.0 lb

## 2012-05-24 DIAGNOSIS — F988 Other specified behavioral and emotional disorders with onset usually occurring in childhood and adolescence: Secondary | ICD-10-CM

## 2012-05-24 DIAGNOSIS — F319 Bipolar disorder, unspecified: Secondary | ICD-10-CM

## 2012-05-24 MED ORDER — TRAZODONE HCL 300 MG PO TABS: 300.0000 mg | ORAL_TABLET | Freq: Every day | ORAL | Status: DC

## 2012-05-24 MED ORDER — LAMOTRIGINE 150 MG PO TABS: 150.0000 mg | ORAL_TABLET | Freq: Two times a day (BID) | ORAL | Status: DC

## 2012-05-24 MED ORDER — CITALOPRAM HYDROBROMIDE 20 MG PO TABS: 20.0000 mg | ORAL_TABLET | Freq: Every day | ORAL | Status: DC

## 2012-05-24 NOTE — Progress Notes (Signed)
Chief complaint I was in the emergency room for cough.      History of present illness. Patient is 26 year old Caucasian female who came for her followup appointment.  Last week patient visited emergency room due to cough and shortness or breath.  She was given antibiotic .  Her blood test was normal including urinalysis .  She is doing better on her current psychiatric medication.  She denies any agitation anger mood swing.  She came today by herself as her mother was sick and could not come with her.  Overall patient likes current psychiatric medication.  She denies any severe anger or poor impulse control.  She denies any side effects of medication.  She is not taking any Abilify or stimulant.  She is not impulsive .  Her relationship with her boyfriend is going well.  Patient is concerned about her mother who is sick however she is handling her sickness very well.  Patient has not involved in any recent self abusive behavior.  She denies any side effects of medication.  She's not drinking or using any illegal substance.  Her weight and vitals are stable.  Current psychiatric medication Celexa 20 mg daily  Lamictal 150 mg twice a day Trazodone 300 mg at bedtime.    Past psychiatric history Patient has a long history of impulsive behavior with multiple inpatient psychiatric treatment. She has history of suicidal attempt, cutting herself and aggressive behavior. She had history of treatment with Prozac Depakote Klonopin Concerta and Strattera.  She [facility] due to suicidal thinking.  Psychosocial history Patient lives with her mother. She has no children. She was involved in very intense relationship which ended yesterday.  Medical history Obesity  Mental status examination Patient is mildly obese female who is casually dressed and fairly groomed.  She is pleasant and cooperative.  Her attention and concentration is better from the past.  She is relevant  in conversation.  She denies any active or passive suicidal thinking and homicidal thinking.  She denies any auditory or visual hallucination.  She has some thought blocking but overall she denies any obsessions or psychosis.  Her speech is fast but relevant.  She denies any paranoid thinking at this time.  There were no tremors or shakes.  She's alert and oriented x3.  Her insight judgment and impulse control is fair.  Assessment Axis I bipolar disorder, ADD Axis II borderline intellect Axis III see medical history Axis IV moderate Axis V 50-55  Plan I reviewed recent medication response, collateral information and blood results from emergency visit.  Her blood results are normal.  I will continue her current psychiatric medication.  I recommend to call us if she is any question or concern about the medication or if she feel worsening of the symptoms.  Time spent 30 minutes.  I will see her again in 4 weeks.  Portion of this note is generated with voice recognition software and may contain typographical error.

## 2012-05-31 ENCOUNTER — Emergency Department (EMERGENCY_DEPARTMENT_HOSPITAL)
Admission: EM | Admit: 2012-05-31 | Discharge: 2012-06-01 | Disposition: A | Discharge status: Other Acute Inpatient Hospital | Payer: Medicare Other | Source: Home / Self Care | Attending: Emergency Medicine | Admitting: Emergency Medicine

## 2012-05-31 ENCOUNTER — Encounter (HOSPITAL_COMMUNITY): Payer: Self-pay | Admitting: *Deleted

## 2012-05-31 ENCOUNTER — Ambulatory Visit (HOSPITAL_COMMUNITY): Payer: Self-pay | Admitting: Psychiatry

## 2012-05-31 DIAGNOSIS — T39094A Poisoning by salicylates, undetermined, initial encounter: Secondary | ICD-10-CM | POA: Insufficient documentation

## 2012-05-31 DIAGNOSIS — T39012A Poisoning by aspirin, intentional self-harm, initial encounter: Secondary | ICD-10-CM

## 2012-05-31 DIAGNOSIS — S51809A Unspecified open wound of unspecified forearm, initial encounter: Secondary | ICD-10-CM | POA: Insufficient documentation

## 2012-05-31 DIAGNOSIS — T398X2A Poisoning by other nonopioid analgesics and antipyretics, not elsewhere classified, intentional self-harm, initial encounter: Secondary | ICD-10-CM | POA: Insufficient documentation

## 2012-05-31 DIAGNOSIS — T394X2A Poisoning by antirheumatics, not elsewhere classified, intentional self-harm, initial encounter: Secondary | ICD-10-CM | POA: Insufficient documentation

## 2012-05-31 LAB — CBC
HCT: 40.7 % (ref 36.0–46.0)
Hemoglobin: 13.2 g/dL (ref 12.0–15.0)
MCH: 30.3 pg (ref 26.0–34.0)
MCHC: 32.4 g/dL (ref 30.0–36.0)
MCV: 93.3 fL (ref 78.0–100.0)
Platelets: 202 10*3/uL (ref 150–400)
RBC: 4.36 MIL/uL (ref 3.87–5.11)
RDW: 13 % (ref 11.5–15.5)
WBC: 6.6 10*3/uL (ref 4.0–10.5)

## 2012-05-31 LAB — COMPREHENSIVE METABOLIC PANEL
ALT: 9 U/L (ref 0–35)
AST: 13 U/L (ref 0–37)
Albumin: 3.7 g/dL (ref 3.5–5.2)
Alkaline Phosphatase: 74 U/L (ref 39–117)
BUN: 8 mg/dL (ref 6–23)
CO2: 27 mEq/L (ref 19–32)
Calcium: 9.7 mg/dL (ref 8.4–10.5)
Chloride: 103 mEq/L (ref 96–112)
Creatinine, Ser: 0.97 mg/dL (ref 0.50–1.10)
GFR calc Af Amer: >90 mL/min (ref >90)
GFR calc non Af Amer: 81 mL/min — ABNORMAL LOW (ref >90)
Glucose, Bld: 90 mg/dL (ref 70–99)
Potassium: 4 mEq/L (ref 3.5–5.1)
Sodium: 138 mEq/L (ref 135–145)
Total Bilirubin: 0.1 mg/dL — ABNORMAL LOW (ref 0.3–1.2)
Total Protein: 7.1 g/dL (ref 6.0–8.3)

## 2012-05-31 LAB — COMPREHENSIVE METABOLIC PANEL WITH GFR
ALT: 9 U/L (ref 0–35)
AST: 12 U/L (ref 0–37)
Albumin: 3.3 g/dL — ABNORMAL LOW (ref 3.5–5.2)
Alkaline Phosphatase: 69 U/L (ref 39–117)
BUN: 8 mg/dL (ref 6–23)
CO2: 23 meq/L (ref 19–32)
Calcium: 9.1 mg/dL (ref 8.4–10.5)
Chloride: 105 meq/L (ref 96–112)
Creatinine, Ser: 0.93 mg/dL (ref 0.50–1.10)
GFR calc Af Amer: >90 mL/min
GFR calc non Af Amer: 85 mL/min — ABNORMAL LOW
Glucose, Bld: 100 mg/dL — ABNORMAL HIGH (ref 70–99)
Potassium: 3.6 meq/L (ref 3.5–5.1)
Sodium: 138 meq/L (ref 135–145)
Total Bilirubin: 0.1 mg/dL — ABNORMAL LOW (ref 0.3–1.2)
Total Protein: 6.4 g/dL (ref 6.0–8.3)

## 2012-05-31 LAB — URINALYSIS, ROUTINE W REFLEX MICROSCOPIC
Bilirubin Urine: NEGATIVE
Glucose, UA: NEGATIVE mg/dL
Hgb urine dipstick: NEGATIVE
Ketones, ur: NEGATIVE mg/dL
Leukocytes, UA: NEGATIVE
Nitrite: NEGATIVE
Protein, ur: NEGATIVE mg/dL
Specific Gravity, Urine: 1.035 — ABNORMAL HIGH (ref 1.005–1.030)
Urobilinogen, UA: 0.2 mg/dL (ref 0.0–1.0)
pH: 6 (ref 5.0–8.0)

## 2012-05-31 LAB — RAPID URINE DRUG SCREEN, HOSP PERFORMED
Amphetamines: POSITIVE — AB
Barbiturates: NOT DETECTED
Benzodiazepines: NOT DETECTED
Cocaine: NOT DETECTED
Opiates: NOT DETECTED
Tetrahydrocannabinol: NOT DETECTED

## 2012-05-31 LAB — BLOOD GAS, ARTERIAL
Acid-base deficit: 0.1 mmol/L (ref 0.0–2.0)
Bicarbonate: 23.3 mEq/L (ref 20.0–24.0)
Drawn by: 336861
FIO2: 0.21 %
O2 Saturation: 98.7 %
Patient temperature: 98.6
TCO2: 20.5 mmol/L (ref 0–100)
pCO2 arterial: 35.8 mmHg (ref 35.0–45.0)
pH, Arterial: 7.429 — ABNORMAL HIGH (ref 7.350–7.400)
pO2, Arterial: 108 mmHg — ABNORMAL HIGH (ref 80.0–100.0)

## 2012-05-31 LAB — CARDIAC PANEL(CRET KIN+CKTOT+MB+TROPI)
CK, MB: 1.3 ng/mL (ref 0.3–4.0)
Relative Index: 0.9 (ref 0.0–2.5)
Total CK: 151 U/L (ref 7–177)
Troponin I: <0.3 ng/mL (ref <0.30)

## 2012-05-31 LAB — SALICYLATE LEVEL
Salicylate Lvl: 24.2 mg/dL — ABNORMAL HIGH (ref 2.8–20.0)
Salicylate Lvl: 26.4 mg/dL — ABNORMAL HIGH (ref 2.8–20.0)

## 2012-05-31 LAB — ETHANOL: Alcohol, Ethyl (B): <11 mg/dL (ref 0–11)

## 2012-05-31 LAB — ACETAMINOPHEN LEVEL: Acetaminophen (Tylenol), Serum: <15 ug/mL (ref 10–30)

## 2012-05-31 LAB — PREGNANCY, URINE: Preg Test, Ur: NEGATIVE

## 2012-05-31 MED ORDER — SODIUM CHLORIDE 0.9 % IV SOLN
INTRAVENOUS | Status: DC
  Administered 2012-05-31: 22:00:00 via INTRAVENOUS

## 2012-05-31 NOTE — ED Notes (Signed)
Pt coming from home with c/o of drug overdose and suicide attempt. Pt states she took 15 324 mg aspirin and attempted to cut her left wrist. Pt presents with multiple lacerations to left wrist. Pt confirms SI denies HI. Pt is A & O x4. No apparent distress.

## 2012-05-31 NOTE — ED Notes (Signed)
Patient is in blue scrubs and red socks and has been wanded by security.

## 2012-05-31 NOTE — ED Notes (Signed)
Patient mother took her one personal belonging bag to the car her cell phone she gave to her mother and she took it to the car as well.

## 2012-05-31 NOTE — ED Notes (Signed)
JYN:[id number]<AO> Expected date:05/31/12<BR> Expected time:<BR> Means of arrival:<BR> Comments:<BR> EMS 61 GC - suiidal/stable/OD on asa/wrist lacs

## 2012-05-31 NOTE — ED Provider Notes (Signed)
History     CSN: [id number]  Arrival date & time 05/31/12  6440   First MD Initiated Contact with Patient 05/31/12 2010      Chief Complaint  Patient presents with  . Ingestion  . Suicidal    (Consider location/radiation/quality/duration/timing/severity/associated sxs/prior treatment) HPI  Patient presents to the ED by EMS after suicide attempt. Patient took approx 15 aspirin and has multiple superficial laceration to her left wrist.  She told her mother after she ingested the substances and then her mom called the police. The patient has an extensive psych history which includes bipolar disorder, ADD, depression and anxiety for which she takes Lamictal and trazodone. The patient tells me she is no longer suicidal but she was an hour ago. She denies having any other injuries or ingesting any other substances. She is in no acute distress right now and is hemodynamically stable at this time. She is A and  0 x 3.  Past Medical History  Diagnosis Date  . Bipolar disorder   . Attention deficit hyperactivity disorder   . Depression   . Anxiety   . Asthma     Past Surgical History  Procedure Date  . Mouth surgery     Family History  Problem Relation Age of Onset  . Depression Mother   . Depression Father   . Depression Brother     History  Substance Use Topics  . Smoking status: Never Smoker   . Smokeless tobacco: Not on file  . Alcohol Use: No    OB History    Grav Para Term Preterm Abortions TAB SAB Ect Mult Living                  Review of Systems   HEENT: denies blurry vision or change in hearing PULMONARY: Denies difficulty breathing and SOB CARDIAC: denies chest pain or heart palpitations MUSCULOSKELETAL:  denies being unable to ambulate ABDOMEN AL: denies abdominal pain GU: denies loss of bowel or urinary control NEURO: denies numbness and tingling in extremities SKIN: no new rashes PSYCH: patient behavior is normal NECK: Not complaining of neck  pain     Allergies  Divalproex sodium and Methylphenidate derivatives  Home Medications   Current Outpatient Rx  Name Route Sig Dispense Refill  . CITALOPRAM HYDROBROMIDE 20 MG PO TABS Oral Take 1 tablet (20 mg total) by mouth at bedtime. For depression. 30 tablet 0  . LAMOTRIGINE 150 MG PO TABS Oral Take 1 tablet (150 mg total) by mouth 2 (two) times daily. For mood control. 60 tablet 0  . PANTOPRAZOLE SODIUM 40 MG PO TBEC Oral Take 1 tablet (40 mg total) by mouth daily. 31 tablet 0  . TRAZODONE HCL 300 MG PO TABS Oral Take 1 tablet (300 mg total) by mouth at bedtime. For insomnia. 30 tablet 0    BP 104/64  Pulse 85  Temp 98.9 F (37.2 C) (Oral)  Resp 15  SpO2 94%  Physical Exam  Nursing note and vitals reviewed. Constitutional: She appears well-developed and well-nourished. No distress.  HENT:  Head: Normocephalic and atraumatic.  Eyes: Pupils are equal, round, and reactive to light.  Neck: Normal range of motion. Neck supple.  Cardiovascular: Normal rate and regular rhythm.   Pulmonary/Chest: Effort normal.  Abdominal: Soft.  Neurological: She is alert.  Skin: Skin is warm and dry.          Multiple superficial lacerations to left forearm      ED Course  Procedures (including  critical care time)  Labs Reviewed  SALICYLATE LEVEL - Abnormal; Notable for the following:    Salicylate Lvl 24.2 (*)     All other components within normal limits  URINE RAPID DRUG SCREEN (HOSP PERFORMED) - Abnormal; Notable for the following:    Amphetamines POSITIVE (*)     All other components within normal limits  BLOOD GAS, ARTERIAL - Abnormal; Notable for the following:    pH, Arterial 7.429 (*)     pO2, Arterial 108.0 (*)     All other components within normal limits  COMPREHENSIVE METABOLIC PANEL - Abnormal; Notable for the following:    Total Bilirubin 0.1 (*)     GFR calc non Af Amer 81 (*)     All other components within normal limits  SALICYLATE LEVEL - Abnormal;  Notable for the following:    Salicylate Lvl 26.4 (*)     All other components within normal limits  COMPREHENSIVE METABOLIC PANEL - Abnormal; Notable for the following:    Glucose, Bld 100 (*)     Albumin 3.3 (*)     Total Bilirubin 0.1 (*)     GFR calc non Af Amer 85 (*)     All other components within normal limits  URINALYSIS, ROUTINE W REFLEX MICROSCOPIC - Abnormal; Notable for the following:    APPearance CLOUDY (*)     Specific Gravity, Urine 1.035 (*)     All other components within normal limits  COMPREHENSIVE METABOLIC PANEL - Abnormal; Notable for the following:    Potassium 3.4 (*)     Glucose, Bld 114 (*)     Calcium 8.3 (*)     Total Protein 5.9 (*)     Albumin 3.1 (*)     Total Bilirubin 0.1 (*)     GFR calc non Af Amer 88 (*)     All other components within normal limits  SALICYLATE LEVEL - Abnormal; Notable for the following:    Salicylate Lvl 20.6 (*)     All other components within normal limits  CBC - Abnormal; Notable for the following:    Hemoglobin 11.7 (*)     All other components within normal limits  ACETAMINOPHEN LEVEL  CBC  ETHANOL  PREGNANCY, URINE  CARDIAC PANEL(CRET KIN+CKTOT+MB+TROPI)  COMPREHENSIVE METABOLIC PANEL  ACETAMINOPHEN LEVEL  SALICYLATE LEVEL   No results found.   1. Intentional aspirin overdose       MDM     Date: 05/31/2012  Rate: 62  Rhythm: pacemaker spikes  QRS Axis: right axis deviation  Intervals: normal  ST/T Wave abnormalities: borderline T wave abnormalities  Conduction Disutrbances:reconstructed pacer spiker leads  Narrative Interpretation:   Old EKG Reviewed: unchanged from Dec 13, 2011   Patient continues to remain stable in ED. She denied any symptoms at first but then developed Tinnitus. Therefore the Poison control aspirin toxicity protocol was initiated. The patient received activated charcoal.  A bolus of 1amp of bicarb follow by an infusion of D5 with of potassium chlor and 3 amps of  bicarb.  Patients asa levels stayed below 30 during the whole stay. Seriel CMP, tylenol and asa levels drawn every hours. They continue to decrease and have dropped below 20. Her tylenol level has stayed normal.   The patient has been medically cleared and I will now have Psych eval.  Patient monitored in the ED, remained hemodynamically stable throughout stay for 10 hours.   Dr. Fonnie Jarvis saw patient during his shift and Dr.  Molpus followed patient as well after Dr. Fonnie Jarvis shift ended.      Dorthula Matas, PA 06/01/12 [phone]

## 2012-06-01 ENCOUNTER — Encounter (HOSPITAL_COMMUNITY): Payer: Self-pay | Admitting: *Deleted

## 2012-06-01 ENCOUNTER — Telehealth (HOSPITAL_COMMUNITY): Payer: Self-pay | Admitting: Licensed Clinical Social Worker

## 2012-06-01 ENCOUNTER — Inpatient Hospital Stay (HOSPITAL_COMMUNITY)
Admission: AD | Admit: 2012-06-01 | Discharge: 2012-06-03 | DRG: 885 | Disposition: A | Discharge status: Home / Self Care | Payer: Medicare Other | Source: Ambulatory Visit | Attending: Psychiatry | Admitting: Psychiatry

## 2012-06-01 DIAGNOSIS — F909 Attention-deficit hyperactivity disorder, unspecified type: Secondary | ICD-10-CM | POA: Diagnosis present

## 2012-06-01 DIAGNOSIS — F319 Bipolar disorder, unspecified: Secondary | ICD-10-CM

## 2012-06-01 DIAGNOSIS — F411 Generalized anxiety disorder: Secondary | ICD-10-CM

## 2012-06-01 DIAGNOSIS — J45909 Unspecified asthma, uncomplicated: Secondary | ICD-10-CM | POA: Diagnosis present

## 2012-06-01 DIAGNOSIS — F3132 Bipolar disorder, current episode depressed, moderate: Secondary | ICD-10-CM | POA: Diagnosis present

## 2012-06-01 DIAGNOSIS — R1110 Vomiting, unspecified: Secondary | ICD-10-CM

## 2012-06-01 DIAGNOSIS — F603 Borderline personality disorder: Secondary | ICD-10-CM | POA: Diagnosis present

## 2012-06-01 DIAGNOSIS — F3130 Bipolar disorder, current episode depressed, mild or moderate severity, unspecified: Principal | ICD-10-CM | POA: Diagnosis present

## 2012-06-01 LAB — COMPREHENSIVE METABOLIC PANEL
ALT: 8 U/L (ref 0–35)
ALT: 8 U/L (ref 0–35)
AST: 11 U/L (ref 0–37)
AST: 11 U/L (ref 0–37)
Albumin: 3.1 g/dL — ABNORMAL LOW (ref 3.5–5.2)
Albumin: 3 g/dL — ABNORMAL LOW (ref 3.5–5.2)
Alkaline Phosphatase: 67 U/L (ref 39–117)
Alkaline Phosphatase: 66 U/L (ref 39–117)
BUN: 8 mg/dL (ref 6–23)
BUN: 8 mg/dL (ref 6–23)
CO2: 26 mEq/L (ref 19–32)
CO2: 28 mEq/L (ref 19–32)
Calcium: 8.3 mg/dL — ABNORMAL LOW (ref 8.4–10.5)
Calcium: 8.4 mg/dL (ref 8.4–10.5)
Chloride: 106 mEq/L (ref 96–112)
Chloride: 104 mEq/L (ref 96–112)
Creatinine, Ser: 0.9 mg/dL (ref 0.50–1.10)
Creatinine, Ser: 0.87 mg/dL (ref 0.50–1.10)
GFR calc Af Amer: >90 mL/min (ref >90)
GFR calc Af Amer: >90 mL/min (ref >90)
GFR calc non Af Amer: 88 mL/min — ABNORMAL LOW (ref >90)
GFR calc non Af Amer: >90 mL/min (ref >90)
Glucose, Bld: 114 mg/dL — ABNORMAL HIGH (ref 70–99)
Glucose, Bld: 94 mg/dL (ref 70–99)
Potassium: 3.4 mEq/L — ABNORMAL LOW (ref 3.5–5.1)
Potassium: 3.8 mEq/L (ref 3.5–5.1)
Sodium: 141 mEq/L (ref 135–145)
Sodium: 139 mEq/L (ref 135–145)
Total Bilirubin: 0.1 mg/dL — ABNORMAL LOW (ref 0.3–1.2)
Total Bilirubin: 0.1 mg/dL — ABNORMAL LOW (ref 0.3–1.2)
Total Protein: 5.9 g/dL — ABNORMAL LOW (ref 6.0–8.3)
Total Protein: 5.9 g/dL — ABNORMAL LOW (ref 6.0–8.3)

## 2012-06-01 LAB — CBC
HCT: 36.5 % (ref 36.0–46.0)
Hemoglobin: 11.7 g/dL — ABNORMAL LOW (ref 12.0–15.0)
MCH: 29.7 pg (ref 26.0–34.0)
MCHC: 32.1 g/dL (ref 30.0–36.0)
MCV: 92.6 fL (ref 78.0–100.0)
Platelets: 181 10*3/uL (ref 150–400)
RBC: 3.94 MIL/uL (ref 3.87–5.11)
RDW: 13.2 % (ref 11.5–15.5)
WBC: 6.6 10*3/uL (ref 4.0–10.5)

## 2012-06-01 LAB — SALICYLATE LEVEL
Salicylate Lvl: 20.6 mg/dL — ABNORMAL HIGH (ref 2.8–20.0)
Salicylate Lvl: 15.4 mg/dL (ref 2.8–20.0)

## 2012-06-01 LAB — ACETAMINOPHEN LEVEL: Acetaminophen (Tylenol), Serum: <15 ug/mL (ref 10–30)

## 2012-06-01 MED ORDER — ALUM & MAG HYDROXIDE-SIMETH 200-200-20 MG/5ML PO SUSP: 30.0000 mL | ORAL | Status: DC | PRN

## 2012-06-01 MED ORDER — PANTOPRAZOLE SODIUM 40 MG PO TBEC
40.0000 mg | DELAYED_RELEASE_TABLET | Freq: Every day | ORAL | Status: DC
  Administered 2012-06-02 – 2012-06-03 (×2): 40 mg via ORAL
  Filled 2012-06-01 (×3): qty 1

## 2012-06-01 MED ORDER — ONDANSETRON HCL 4 MG PO TABS: 4.0000 mg | ORAL_TABLET | Freq: Three times a day (TID) | ORAL | Status: DC | PRN

## 2012-06-01 MED ORDER — ACETAMINOPHEN 325 MG PO TABS: 650.0000 mg | ORAL_TABLET | Freq: Four times a day (QID) | ORAL | Status: DC | PRN

## 2012-06-01 MED ORDER — ACETAMINOPHEN 325 MG PO TABS: 650.0000 mg | ORAL_TABLET | ORAL | Status: DC | PRN

## 2012-06-01 MED ORDER — ZOLPIDEM TARTRATE 5 MG PO TABS: 5.0000 mg | ORAL_TABLET | Freq: Every evening | ORAL | Status: DC | PRN

## 2012-06-01 MED ORDER — IBUPROFEN 600 MG PO TABS: 600.0000 mg | ORAL_TABLET | Freq: Three times a day (TID) | ORAL | Status: DC | PRN

## 2012-06-01 MED ORDER — CHARCOAL ACTIVATED PO LIQD
50.0000 g | Freq: Once | ORAL | Status: AC
  Administered 2012-06-01: 50 g via ORAL
  Filled 2012-06-01: qty 240

## 2012-06-01 MED ORDER — TRAZODONE HCL 150 MG PO TABS
300.0000 mg | ORAL_TABLET | Freq: Every day | ORAL | Status: DC
  Administered 2012-06-01 – 2012-06-02 (×2): 300 mg via ORAL
  Filled 2012-06-01: qty 6
  Filled 2012-06-01 (×3): qty 2

## 2012-06-01 MED ORDER — SODIUM BICARBONATE 8.4 % IV SOLN: 50.0000 meq | Freq: Once | INTRAVENOUS | Status: DC

## 2012-06-01 MED ORDER — MAGNESIUM HYDROXIDE 400 MG/5ML PO SUSP: 30.0000 mL | Freq: Every day | ORAL | Status: DC | PRN

## 2012-06-01 MED ORDER — SODIUM BICARBONATE 8.4 % IV SOLN
INTRAVENOUS | Status: DC
  Administered 2012-06-01: 02:00:00 via INTRAVENOUS
  Filled 2012-06-01 (×5): qty 850

## 2012-06-01 MED ORDER — CITALOPRAM HYDROBROMIDE 20 MG PO TABS
20.0000 mg | ORAL_TABLET | Freq: Every day | ORAL | Status: DC
  Administered 2012-06-02: 20 mg via ORAL
  Filled 2012-06-01 (×2): qty 1

## 2012-06-01 MED ORDER — NICOTINE 21 MG/24HR TD PT24: 21.0000 mg | MEDICATED_PATCH | Freq: Every day | TRANSDERMAL | Status: DC

## 2012-06-01 MED ORDER — SODIUM BICARBONATE 8.4 % IV SOLN
50.0000 meq | Freq: Once | INTRAVENOUS | Status: AC
  Administered 2012-06-01: 50 meq via INTRAVENOUS
  Filled 2012-06-01: qty 50
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
## 2012-06-01 NOTE — Tx Team (Signed)
Initial Interdisciplinary Treatment Plan  PATIENT STRENGTHS: (choose at least two) Ability for insight Average or above average intelligence Capable of independent living Communication skills Financial means General fund of knowledge Motivation for treatment/growth Physical Health Religious Affiliation Supportive family/friends  PATIENT STRESSORS: Financial difficulties Marital or family conflict   PROBLEM LIST: Problem List/Patient Goals Date to be addressed Date deferred Reason deferred Estimated date of resolution  Depression      Anxiety      SI                                           DISCHARGE CRITERIA:  Ability to meet basic life and health needs Adequate post-discharge living arrangements Improved stabilization in mood, thinking, and/or behavior Medical problems require only outpatient monitoring Motivation to continue treatment in a less acute level of care Need for constant or close observation no longer present Reduction of life-threatening or endangering symptoms to within safe limits Safe-care adequate arrangements made Verbal commitment to aftercare and medication compliance  PRELIMINARY DISCHARGE PLAN: Outpatient therapy Return to previous living arrangement  PATIENT/FAMIILY INVOLVEMENT: This treatment plan has been presented to and reviewed with the patient, [name].  The patient has been given the opportunity to ask questions and make suggestions.  [name] 06/01/2012, 10:16 PM

## 2012-06-01 NOTE — ED Notes (Signed)
Pt has been accepted to [facility] by Dr. Allena Katz to Dr. Dan Humphreys, bed 502-2. EDP has been notified and is in agreement with disposition. RN has been made to call report. All support paper work has been completed and faxed to [facility] for review. Pt is in agreement with disposition to transfer to [facility].  No further needs identified at this time.

## 2012-06-01 NOTE — ED Provider Notes (Signed)
  Physical Exam  BP 118/79  Pulse 59  Temp 98.2 F (36.8 C) (Oral)  Resp 18  SpO2 98%  Physical Exam  ED Course  Procedures  MDM Patient now states that she overdosed on soma some of her other medications. His been 24 hours she appears to be medically cleared. She's been accepted at behavioral health.      Juliet Rude. Rubin Payor, MD 06/01/12 1801

## 2012-06-01 NOTE — ED Provider Notes (Signed)
Medical screening examination/treatment/procedure(s) were conducted as a shared visit with non-physician practitioner(s) and myself.  I personally evaluated the patient during the encounter  Sudden impulsive gesture when had argument and got angry then OD on reported 15 tabs ASA as well as self-mutilation abrasions left wrist; never really had SI, also no HI/psychosis, no SI now, was fine all day, only has minimal tinittis, no hyperventilation, initial labs unremarkable except ASA level positive but less than 30, will follow serial labs in ED to screen for signs of toxicity developing.  Hurman Horn, MD 06/02/12 Moses Manners

## 2012-06-01 NOTE — Consult Note (Signed)
Reason for Consult: Self-injurious behavior and suicidal attempt with overdose on aspirin and history of for mood disorder Referring Physician: ED physician  [name] is an 26 y.o. female.  HPI: Patient was seen and chart reviewed. This is a 26 years old is single Caucasian female, brought in by emergency medical services to the [facility] long emergency department followed by self-injurious behavior and suicidal attempt with overdose on aspirin. Patient reported she has been arguing with her boyfriend x one year and 3 months and broke up, got mad went to the kitchen took the knife superficially cut on her left wrist and than she she continued to talk with her boyfriend on the phone and got more angry than went to the medication cabinet and grabbed aspirin 10 or 15 pills. At this point Patient mother tried to prevent overdose, but patient was belligerent with her mother. Patient brother called the 911 to get the emergency services. Patient has a multiple previous acute psychiatric hospitalization for self injurious behavior, depression, and suicidal ideations. Patient requested to be stabilized on her emotional condition and suicidal ideation. Patient lost her grandmother 2 years ago and still grieving and unable to forget her. Reportedly Patient was very close to her grandmother. Patient was frustrated about her boyfriend leaving to [place], [place] to live with his aunt's home. She also reported they are able to get together after talking to each other this morning and not stressed about her relationship at this time..  Past Medical History  Diagnosis Date  . Bipolar disorder   . Attention deficit hyperactivity disorder   . Depression   . Anxiety   . Asthma     Past Surgical History  Procedure Date  . Mouth surgery     Family History  Problem Relation Age of Onset  . Depression Mother   . Depression Father   . Depression Brother     Social History:  reports that she has never  smoked. She does not have any smokeless tobacco history on file. She reports that she does not drink alcohol or use illicit drugs.  Allergies:  Allergies  Allergen Reactions  . Divalproex Sodium     Pt had h/o intolerance from Depakote. Stated that she never wants this med.  . Methylphenidate Derivatives     Gets depressed and lots of anger    Medications: I have reviewed the patient's current medications.  Results for orders placed during the hospital encounter of 05/31/12 (from the past 48 hour(s))  COMPREHENSIVE METABOLIC PANEL     Status: Abnormal   Collection Time   05/31/12  8:55 PM      Component Value Range Comment   Sodium 138  135 - 145 mEq/L    Potassium 4.0  3.5 - 5.1 mEq/L    Chloride 103  96 - 112 mEq/L    CO2 27  19 - 32 mEq/L    Glucose, Bld 90  70 - 99 mg/dL    BUN 8  6 - 23 mg/dL    Creatinine, Ser 1.61  0.50 - 1.10 mg/dL    Calcium 9.7  8.4 - 09.6 mg/dL    Total Protein 7.1  6.0 - 8.3 g/dL    Albumin 3.7  3.5 - 5.2 g/dL    AST 13  0 - 37 U/L    ALT 9  0 - 35 U/L    Alkaline Phosphatase 74  39 - 117 U/L    Total Bilirubin 0.1 (*) 0.3 - 1.2  mg/dL    GFR calc non Af Amer 81 (*) >90 mL/min    GFR calc Af Amer >90  >90 mL/min   SALICYLATE LEVEL     Status: Abnormal   Collection Time   05/31/12  8:56 PM      Component Value Range Comment   Salicylate Lvl 24.2 (*) 2.8 - 20.0 mg/dL   ACETAMINOPHEN LEVEL     Status: Normal   Collection Time   05/31/12  8:56 PM      Component Value Range Comment   Acetaminophen (Tylenol), Serum <15.0  10 - 30 ug/mL   CBC     Status: Normal   Collection Time   05/31/12  8:56 PM      Component Value Range Comment   WBC 6.6  4.0 - 10.5 K/uL    RBC 4.36  3.87 - 5.11 MIL/uL    Hemoglobin 13.2  12.0 - 15.0 g/dL    HCT 16.1  09.6 - 04.5 %    MCV 93.3  78.0 - 100.0 fL    MCH 30.3  26.0 - 34.0 pg    MCHC 32.4  30.0 - 36.0 g/dL    RDW 40.9  81.1 - 91.4 %    Platelets 202  150 - 400 K/uL   ETHANOL     Status: Normal   Collection  Time   05/31/12  8:56 PM      Component Value Range Comment   Alcohol, Ethyl (B) <11  0 - 11 mg/dL   CARDIAC PANEL(CRET KIN+CKTOT+MB+TROPI)     Status: Normal   Collection Time   05/31/12  8:56 PM      Component Value Range Comment   Total CK 151  7 - 177 U/L    CK, MB 1.3  0.3 - 4.0 ng/mL    Troponin I <0.30  <0.30 ng/mL    Relative Index 0.9  0.0 - 2.5   URINE RAPID DRUG SCREEN (HOSP PERFORMED)     Status: Abnormal   Collection Time   05/31/12  9:01 PM      Component Value Range Comment   Opiates NONE DETECTED  NONE DETECTED    Cocaine NONE DETECTED  NONE DETECTED    Benzodiazepines NONE DETECTED  NONE DETECTED    Amphetamines POSITIVE (*) NONE DETECTED    Tetrahydrocannabinol NONE DETECTED  NONE DETECTED    Barbiturates NONE DETECTED  NONE DETECTED   PREGNANCY, URINE     Status: Normal   Collection Time   05/31/12  9:01 PM      Component Value Range Comment   Preg Test, Ur NEGATIVE  NEGATIVE   URINALYSIS, ROUTINE W REFLEX MICROSCOPIC     Status: Abnormal   Collection Time   05/31/12  9:01 PM      Component Value Range Comment   Color, Urine YELLOW  YELLOW    APPearance CLOUDY (*) CLEAR    Specific Gravity, Urine 1.035 (*) 1.005 - 1.030    pH 6.0  5.0 - 8.0    Glucose, UA NEGATIVE  NEGATIVE mg/dL    Hgb urine dipstick NEGATIVE  NEGATIVE    Bilirubin Urine NEGATIVE  NEGATIVE    Ketones, ur NEGATIVE  NEGATIVE mg/dL    Protein, ur NEGATIVE  NEGATIVE mg/dL    Urobilinogen, UA 0.2  0.0 - 1.0 mg/dL    Nitrite NEGATIVE  NEGATIVE    Leukocytes, UA NEGATIVE  NEGATIVE MICROSCOPIC NOT DONE ON URINES WITH NEGATIVE PROTEIN, BLOOD, LEUKOCYTES, NITRITE, OR  GLUCOSE <1000 mg/dL.  BLOOD GAS, ARTERIAL     Status: Abnormal   Collection Time   05/31/12  9:07 PM      Component Value Range Comment   FIO2 0.21      pH, Arterial 7.429 (*) 7.350 - 7.400    pCO2 arterial 35.8  35.0 - 45.0 mmHg    pO2, Arterial 108.0 (*) 80.0 - 100.0 mmHg    Bicarbonate 23.3  20.0 - 24.0 mEq/L    TCO2 20.5  0 -  100 mmol/L    Acid-base deficit 0.1  0.0 - 2.0 mmol/L    O2 Saturation 98.7      Patient temperature 98.6      Collection site RIGHT RADIAL      Drawn by [id number]      Sample type ARTERIAL DRAW      Allens test (pass/fail) PASS  PASS   SALICYLATE LEVEL     Status: Abnormal   Collection Time   05/31/12 10:58 PM      Component Value Range Comment   Salicylate Lvl 26.4 (*) 2.8 - 20.0 mg/dL   COMPREHENSIVE METABOLIC PANEL     Status: Abnormal   Collection Time   05/31/12 10:58 PM      Component Value Range Comment   Sodium 138  135 - 145 mEq/L    Potassium 3.6  3.5 - 5.1 mEq/L    Chloride 105  96 - 112 mEq/L    CO2 23  19 - 32 mEq/L    Glucose, Bld 100 (*) 70 - 99 mg/dL    BUN 8  6 - 23 mg/dL    Creatinine, Ser 9.14  0.50 - 1.10 mg/dL    Calcium 9.1  8.4 - 78.2 mg/dL    Total Protein 6.4  6.0 - 8.3 g/dL    Albumin 3.3 (*) 3.5 - 5.2 g/dL    AST 12  0 - 37 U/L    ALT 9  0 - 35 U/L    Alkaline Phosphatase 69  39 - 117 U/L    Total Bilirubin 0.1 (*) 0.3 - 1.2 mg/dL    GFR calc non Af Amer 85 (*) >90 mL/min    GFR calc Af Amer >90  >90 mL/min   COMPREHENSIVE METABOLIC PANEL     Status: Abnormal   Collection Time   06/01/12  2:25 AM      Component Value Range Comment   Sodium 141  135 - 145 mEq/L    Potassium 3.4 (*) 3.5 - 5.1 mEq/L    Chloride 106  96 - 112 mEq/L    CO2 26  19 - 32 mEq/L    Glucose, Bld 114 (*) 70 - 99 mg/dL    BUN 8  6 - 23 mg/dL    Creatinine, Ser 9.56  0.50 - 1.10 mg/dL    Calcium 8.3 (*) 8.4 - 10.5 mg/dL    Total Protein 5.9 (*) 6.0 - 8.3 g/dL    Albumin 3.1 (*) 3.5 - 5.2 g/dL    AST 11  0 - 37 U/L    ALT 8  0 - 35 U/L    Alkaline Phosphatase 67  39 - 117 U/L    Total Bilirubin 0.1 (*) 0.3 - 1.2 mg/dL    GFR calc non Af Amer 88 (*) >90 mL/min    GFR calc Af Amer >90  >90 mL/min   SALICYLATE LEVEL     Status: Abnormal   Collection Time  06/01/12  2:25 AM      Component Value Range Comment   Salicylate Lvl 20.6 (*) 2.8 - 20.0 mg/dL   CBC     Status:  Abnormal   Collection Time   06/01/12  2:25 AM      Component Value Range Comment   WBC 6.6  4.0 - 10.5 K/uL    RBC 3.94  3.87 - 5.11 MIL/uL    Hemoglobin 11.7 (*) 12.0 - 15.0 g/dL    HCT 40.9  81.1 - 91.4 %    MCV 92.6  78.0 - 100.0 fL    MCH 29.7  26.0 - 34.0 pg    MCHC 32.1  30.0 - 36.0 g/dL    RDW 78.2  95.6 - 21.3 %    Platelets 181  150 - 400 K/uL   COMPREHENSIVE METABOLIC PANEL     Status: Abnormal   Collection Time   06/01/12  5:30 AM      Component Value Range Comment   Sodium 139  135 - 145 mEq/L    Potassium 3.8  3.5 - 5.1 mEq/L    Chloride 104  96 - 112 mEq/L    CO2 28  19 - 32 mEq/L    Glucose, Bld 94  70 - 99 mg/dL    BUN 8  6 - 23 mg/dL    Creatinine, Ser 0.86  0.50 - 1.10 mg/dL    Calcium 8.4  8.4 - 57.8 mg/dL    Total Protein 5.9 (*) 6.0 - 8.3 g/dL    Albumin 3.0 (*) 3.5 - 5.2 g/dL    AST 11  0 - 37 U/L    ALT 8  0 - 35 U/L    Alkaline Phosphatase 66  39 - 117 U/L    Total Bilirubin 0.1 (*) 0.3 - 1.2 mg/dL    GFR calc non Af Amer >90  >90 mL/min    GFR calc Af Amer >90  >90 mL/min   ACETAMINOPHEN LEVEL     Status: Normal   Collection Time   06/01/12  5:30 AM      Component Value Range Comment   Acetaminophen (Tylenol), Serum <15.0  10 - 30 ug/mL   SALICYLATE LEVEL     Status: Normal   Collection Time   06/01/12  5:30 AM      Component Value Range Comment   Salicylate Lvl 15.4  2.8 - 20.0 mg/dL     No results found.  No psychosis and Positive for anxiety, bad mood, behavior problems, bipolar, borderline personality disorder, depression and mood swings Blood pressure 118/79, pulse 59, temperature 98.2 F (36.8 C), temperature source Oral, resp. rate 18, SpO2 98.00%.   Assessment/Plan: Bipolar disorder, not otherwise specified Anxiety disorder, not otherwise specified. Attention deficit hyperactive disorder by history  Recommended acute psychiatric hospitalization for safety and stabilization and medication adjustment.  Jeronda Don,JANARDHAHA  R. 06/01/2012, 5:34 PM
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
## 2012-06-02 DIAGNOSIS — F3132 Bipolar disorder, current episode depressed, moderate: Secondary | ICD-10-CM

## 2012-06-02 MED ORDER — PANTOPRAZOLE SODIUM 40 MG PO TBEC: 40.0000 mg | DELAYED_RELEASE_TABLET | Freq: Every day | ORAL | Status: DC

## 2012-06-02 MED ORDER — TRAZODONE HCL 150 MG PO TABS
300.0000 mg | ORAL_TABLET | Freq: Every day | ORAL | Status: DC
Start: 2012-06-02 — End: 2012-06-02
  Filled 2012-06-02: qty 2

## 2012-06-02 MED ORDER — LAMOTRIGINE 150 MG PO TABS
150.0000 mg | ORAL_TABLET | Freq: Two times a day (BID) | ORAL | Status: DC
  Administered 2012-06-02 – 2012-06-03 (×2): 150 mg via ORAL
  Filled 2012-06-02 (×4): qty 1

## 2012-06-02 MED ORDER — CITALOPRAM HYDROBROMIDE 20 MG PO TABS
20.0000 mg | ORAL_TABLET | Freq: Every day | ORAL | Status: DC
  Administered 2012-06-02: 20 mg via ORAL
  Filled 2012-06-02 (×3): qty 1

## 2012-06-02 NOTE — Progress Notes (Signed)
D) Patient resting quietly in room upon my assessment. Patient c/o light headed episodes today. Patient states " I am light headed because I didn't get much sleep last night." A)  Patient encouraged to be careful when attempting to stand or ambulate to avoid risk of falling. R) Patient verbalizes understanding, will not attempt to get up if feeling dizzy or light headed. Patient denies SI/HI, denies A/V hallucinations. Patient remains safe on unit with Q15 minute checks for safety. Will continue to monitor.
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
## 2012-06-02 NOTE — Progress Notes (Addendum)
[facility] MD Progress Note  06/02/2012 11:31 PM  Diagnosis:   Axis I: Bipolar, Depressed Axis II: Borderline Personality Dis. Axis III:  Past Medical History  Diagnosis Date  . Bipolar disorder   . Attention deficit hyperactivity disorder   . Depression   . Anxiety   . Asthma     ADL's:  Intact  Sleep: Fair  Appetite:  Fair  Suicidal Ideation:  Pt denies suicidal thoughts. Homicidal Ideation:  Denies adamantly any homicidal thoughts.  Mental Status Examination/Evaluation: Objective:  Appearance: Casual  Eye Contact::  Good  Speech:  Clear and Coherent  Volume:  Normal  Mood:  Anxious and Dysphoric  Affect:  Congruent  Thought Process:  Coherent  Orientation:  Full  Thought Content:  WDL  Suicidal Thoughts:  No  Homicidal Thoughts:  No  Memory:  Immediate;   Fair  Judgement:  Fair  Insight:  Fair  Psychomotor Activity:  Normal  Concentration:  Fair  Recall:  Fair  Akathisia:  No  Handed:    AIMS (if indicated):     Assets:  Communication Skills Desire for Improvement  Sleep:  Number of Hours: 5.75    Vital Signs:Blood pressure 101/68, pulse 84, temperature 98.6 F (37 C), temperature source Oral, resp. rate 16, height 5\' 4"  (1.626 m), weight 98.884 kg (218 lb), last menstrual period 04/14/2012. Current Medications: Current Facility-Administered Medications  Medication Dose Route Frequency Provider Last Rate Last Dose  . acetaminophen (TYLENOL) tablet 650 mg  650 mg Oral Q6H PRN Wonda Cerise, MD      . alum & mag hydroxide-simeth (MAALOX/MYLANTA) 200-200-20 MG/5ML suspension 30 mL  30 mL Oral Q4H PRN Wonda Cerise, MD      . citalopram (CELEXA) tablet 20 mg  20 mg Oral QHS Sanjuana Kava, NP   20 mg at 06/02/12 2311  . lamoTRIgine (LAMICTAL) tablet 150 mg  150 mg Oral BID Sanjuana Kava, NP   150 mg at 06/02/12 1714  . magnesium hydroxide (MILK OF MAGNESIA) suspension 30 mL  30 mL Oral Daily PRN Wonda Cerise, MD      . pantoprazole (PROTONIX) EC tablet 40 mg  40 mg Oral Q  breakfast Wonda Cerise, MD   40 mg at 06/02/12 0814  . traZODone (DESYREL) tablet 300 mg  300 mg Oral QHS Wonda Cerise, MD   300 mg at 06/02/12 2311  . DISCONTD: citalopram (CELEXA) tablet 20 mg  20 mg Oral Daily Wonda Cerise, MD   20 mg at 06/02/12 0814  . DISCONTD: pantoprazole (PROTONIX) EC tablet 40 mg  40 mg Oral Daily Sanjuana Kava, NP      . DISCONTD: traZODone (DESYREL) tablet 300 mg  300 mg Oral QHS Sanjuana Kava, NP        Lab Results:  Results for orders placed during the hospital encounter of 05/31/12 (from the past 48 hour(s))  COMPREHENSIVE METABOLIC PANEL     Status: Abnormal   Collection Time   06/01/12  2:25 AM      Component Value Range Comment   Sodium 141  135 - 145 mEq/L    Potassium 3.4 (*) 3.5 - 5.1 mEq/L    Chloride 106  96 - 112 mEq/L    CO2 26  19 - 32 mEq/L    Glucose, Bld 114 (*) 70 - 99 mg/dL    BUN 8  6 - 23 mg/dL    Creatinine, Ser 9.81  0.50 - 1.10 mg/dL    Calcium 8.3 (*)  8.4 - 10.5 mg/dL    Total Protein 5.9 (*) 6.0 - 8.3 g/dL    Albumin 3.1 (*) 3.5 - 5.2 g/dL    AST 11  0 - 37 U/L    ALT 8  0 - 35 U/L    Alkaline Phosphatase 67  39 - 117 U/L    Total Bilirubin 0.1 (*) 0.3 - 1.2 mg/dL    GFR calc non Af Amer 88 (*) >90 mL/min    GFR calc Af Amer >90  >90 mL/min   SALICYLATE LEVEL     Status: Abnormal   Collection Time   06/01/12  2:25 AM      Component Value Range Comment   Salicylate Lvl 20.6 (*) 2.8 - 20.0 mg/dL   CBC     Status: Abnormal   Collection Time   06/01/12  2:25 AM      Component Value Range Comment   WBC 6.6  4.0 - 10.5 K/uL    RBC 3.94  3.87 - 5.11 MIL/uL    Hemoglobin 11.7 (*) 12.0 - 15.0 g/dL    HCT 16.1  09.6 - 04.5 %    MCV 92.6  78.0 - 100.0 fL    MCH 29.7  26.0 - 34.0 pg    MCHC 32.1  30.0 - 36.0 g/dL    RDW 40.9  81.1 - 91.4 %    Platelets 181  150 - 400 K/uL   COMPREHENSIVE METABOLIC PANEL     Status: Abnormal   Collection Time   06/01/12  5:30 AM      Component Value Range Comment   Sodium 139  135 - 145 mEq/L     Potassium 3.8  3.5 - 5.1 mEq/L    Chloride 104  96 - 112 mEq/L    CO2 28  19 - 32 mEq/L    Glucose, Bld 94  70 - 99 mg/dL    BUN 8  6 - 23 mg/dL    Creatinine, Ser 7.82  0.50 - 1.10 mg/dL    Calcium 8.4  8.4 - 95.6 mg/dL    Total Protein 5.9 (*) 6.0 - 8.3 g/dL    Albumin 3.0 (*) 3.5 - 5.2 g/dL    AST 11  0 - 37 U/L    ALT 8  0 - 35 U/L    Alkaline Phosphatase 66  39 - 117 U/L    Total Bilirubin 0.1 (*) 0.3 - 1.2 mg/dL    GFR calc non Af Amer >90  >90 mL/min    GFR calc Af Amer >90  >90 mL/min   ACETAMINOPHEN LEVEL     Status: Normal   Collection Time   06/01/12  5:30 AM      Component Value Range Comment   Acetaminophen (Tylenol), Serum <15.0  10 - 30 ug/mL   SALICYLATE LEVEL     Status: Normal   Collection Time   06/01/12  5:30 AM      Component Value Range Comment   Salicylate Lvl 15.4  2.8 - 20.0 mg/dL     Physical Findings: AIMS:  , ,  ,  ,    CIWA:    COWS:     Treatment Plan Summary: Daily contact with patient to assess and evaluate symptoms and progress in treatment Medication management  Plan: Admit, continue her effective medications and consider D/C tomorrow.  Rashee Marschall 06/02/2012, 11:31 PM

## 2012-06-02 NOTE — Progress Notes (Signed)
[facility] Group Notes: (Counselor/Nursing/MHT/Case Management/Adjunct) 06/02/2012    11:00am Emotion Regulation  Type of Therapy:  Group Therapy  Participation Level:  Limited  Participation Quality:  Attentive, Sharing  Affect:  Blunted  Cognitive:  Appropriate  Insight:  Limited  Engagement in Group: Good  Engagement in Therapy:  Limited  Modes of Intervention:  Support and Exploration  Summary of Progress/Problems: [name] came to group late but was very engaged. She explored ways that she has dealt with distressing emotions, including screaming, cursing and cutting herself. She did not seem able to process healthier ways, but related well to others who discussed changing perspective.  [name] 06/02/2012  2:33 PM     [facility] Group Notes:  (Counselor/Nursing/MHT/Case Management/Adjunct) 06/02/2012  1:15pm Breathing & Relaxation for Anxiety/ Anger   Type of Therapy:  Group Therapy  Participation Level:  Did Not Attend     [name] 06/02/2012   2:37 PM

## 2012-06-02 NOTE — Progress Notes (Signed)
06/02/2012         Time: 1415      Group Topic/Focus: The focus of this group is on discussing various styles of communication and communicating assertively using 'I' (feeling) statements.   Participation Level: Minimal  Participation Quality: Redirectable  Affect: Blunted  Cognitive: Alert  Additional Comments: Patient late to group, minimally engaged, trying to have side conversations with peers.   Layla Kesling 06/02/2012 3:02 PM

## 2012-06-02 NOTE — [facility] Suicide Risk Assessment (Signed)
Suicide Risk Assessment  Admission Assessment     Demographic factors:  Assessment Details Time of Assessment: Admission Information Obtained From: Patient Current Mental Status:  Current Mental Status:  (currently denies SI) Loss Factors:  Loss Factors: Loss of significant relationship;Financial problems / change in socioeconomic status Historical Factors:  Historical Factors: Prior suicide attempts;Family history of mental illness or substance abuse Risk Reduction Factors:  Risk Reduction Factors: Religious beliefs about death;Sense of responsibility to family;Positive social support;Living with another person, especially a relative  CLINICAL FACTORS:   Bipolar Disorder:   Bipolar II Personality Disorders:   Cluster B Previous Psychiatric Diagnoses and Treatments  COGNITIVE FEATURES THAT CONTRIBUTE TO RISK:  Thought constriction (tunnel vision)    SUICIDE RISK:   Mild:  Suicidal ideation of limited frequency, intensity, duration, and specificity.  There are no identifiable plans, no associated intent, mild dysphoria and related symptoms, good self-control (both objective and subjective assessment), few other risk factors, and identifiable protective factors, including available and accessible social support.  Reason for hospitalization: .Suicidal behavior scratches on wrist. Diagnosis:   Axis I: Bipolar, Depressed Axis II: Borderline Personality Dis. Axis III:  Past Medical History  Diagnosis Date  . Bipolar disorder   . Attention deficit hyperactivity disorder   . Depression   . Anxiety   . Asthma     ADL's:  Intact  Sleep: Fair  Appetite:  Fair  Suicidal Ideation:  Pt denies suicidal thoughts. Homicidal Ideation:  Denies adamantly any homicidal thoughts.  Mental Status Examination/Evaluation: Objective:  Appearance: Casual  Eye Contact::  Good  Speech:  Clear and Coherent  Volume:  Normal  Mood:  Anxious and Dysphoric  Affect:  Congruent  Thought Process:   Coherent  Orientation:  Full  Thought Content:  WDL  Suicidal Thoughts:  No  Homicidal Thoughts:  No  Memory:  Immediate;   Fair  Judgement:  Fair  Insight:  Fair  Psychomotor Activity:  Normal  Concentration:  Fair  Recall:  Fair  Akathisia:  No  Handed:    AIMS (if indicated):     Assets:  Communication Skills Desire for Improvement  Sleep:  Number of Hours: 5.75    Vital Signs:Blood pressure 101/68, pulse 84, temperature 98.6 F (37 C), temperature source Oral, resp. rate 16, height 5\' 4"  (1.626 m), weight 98.884 kg (218 lb), last menstrual period 04/14/2012. Current Medications: Current Facility-Administered Medications  Medication Dose Route Frequency Provider Last Rate Last Dose  . acetaminophen (TYLENOL) tablet 650 mg  650 mg Oral Q6H PRN Wonda Cerise, MD      . alum & mag hydroxide-simeth (MAALOX/MYLANTA) 200-200-20 MG/5ML suspension 30 mL  30 mL Oral Q4H PRN Wonda Cerise, MD      . citalopram (CELEXA) tablet 20 mg  20 mg Oral QHS Sanjuana Kava, NP   20 mg at 06/02/12 2311  . lamoTRIgine (LAMICTAL) tablet 150 mg  150 mg Oral BID Sanjuana Kava, NP   150 mg at 06/02/12 1714  . magnesium hydroxide (MILK OF MAGNESIA) suspension 30 mL  30 mL Oral Daily PRN Wonda Cerise, MD      . pantoprazole (PROTONIX) EC tablet 40 mg  40 mg Oral Q breakfast Wonda Cerise, MD   40 mg at 06/02/12 0814  . traZODone (DESYREL) tablet 300 mg  300 mg Oral QHS Wonda Cerise, MD   300 mg at 06/02/12 2311  . DISCONTD: citalopram (CELEXA) tablet 20 mg  20 mg Oral Daily Wonda Cerise, MD  20 mg at 06/02/12 0814  . DISCONTD: pantoprazole (PROTONIX) EC tablet 40 mg  40 mg Oral Daily Sanjuana Kava, NP      . DISCONTD: traZODone (DESYREL) tablet 300 mg  300 mg Oral QHS Sanjuana Kava, NP        Lab Results:  Results for orders placed during the hospital encounter of 05/31/12 (from the past 48 hour(s))  COMPREHENSIVE METABOLIC PANEL     Status: Abnormal   Collection Time   06/01/12  2:25 AM      Component Value Range  Comment   Sodium 141  135 - 145 mEq/L    Potassium 3.4 (*) 3.5 - 5.1 mEq/L    Chloride 106  96 - 112 mEq/L    CO2 26  19 - 32 mEq/L    Glucose, Bld 114 (*) 70 - 99 mg/dL    BUN 8  6 - 23 mg/dL    Creatinine, Ser 9.56  0.50 - 1.10 mg/dL    Calcium 8.3 (*) 8.4 - 10.5 mg/dL    Total Protein 5.9 (*) 6.0 - 8.3 g/dL    Albumin 3.1 (*) 3.5 - 5.2 g/dL    AST 11  0 - 37 U/L    ALT 8  0 - 35 U/L    Alkaline Phosphatase 67  39 - 117 U/L    Total Bilirubin 0.1 (*) 0.3 - 1.2 mg/dL    GFR calc non Af Amer 88 (*) >90 mL/min    GFR calc Af Amer >90  >90 mL/min   SALICYLATE LEVEL     Status: Abnormal   Collection Time   06/01/12  2:25 AM      Component Value Range Comment   Salicylate Lvl 20.6 (*) 2.8 - 20.0 mg/dL   CBC     Status: Abnormal   Collection Time   06/01/12  2:25 AM      Component Value Range Comment   WBC 6.6  4.0 - 10.5 K/uL    RBC 3.94  3.87 - 5.11 MIL/uL    Hemoglobin 11.7 (*) 12.0 - 15.0 g/dL    HCT 21.3  08.6 - 57.8 %    MCV 92.6  78.0 - 100.0 fL    MCH 29.7  26.0 - 34.0 pg    MCHC 32.1  30.0 - 36.0 g/dL    RDW 46.9  62.9 - 52.8 %    Platelets 181  150 - 400 K/uL   COMPREHENSIVE METABOLIC PANEL     Status: Abnormal   Collection Time   06/01/12  5:30 AM      Component Value Range Comment   Sodium 139  135 - 145 mEq/L    Potassium 3.8  3.5 - 5.1 mEq/L    Chloride 104  96 - 112 mEq/L    CO2 28  19 - 32 mEq/L    Glucose, Bld 94  70 - 99 mg/dL    BUN 8  6 - 23 mg/dL    Creatinine, Ser 4.13  0.50 - 1.10 mg/dL    Calcium 8.4  8.4 - 24.4 mg/dL    Total Protein 5.9 (*) 6.0 - 8.3 g/dL    Albumin 3.0 (*) 3.5 - 5.2 g/dL    AST 11  0 - 37 U/L    ALT 8  0 - 35 U/L    Alkaline Phosphatase 66  39 - 117 U/L    Total Bilirubin 0.1 (*) 0.3 - 1.2 mg/dL    GFR  calc non Af Amer >90  >90 mL/min    GFR calc Af Amer >90  >90 mL/min   ACETAMINOPHEN LEVEL     Status: Normal   Collection Time   06/01/12  5:30 AM      Component Value Range Comment   Acetaminophen (Tylenol), Serum <15.0  10  - 30 ug/mL   SALICYLATE LEVEL     Status: Normal   Collection Time   06/01/12  5:30 AM      Component Value Range Comment   Salicylate Lvl 15.4  2.8 - 20.0 mg/dL     Physical Findings: AIMS:  , ,  ,  ,    CIWA:    COWS:     Risk: Risk of harm to self is elevated by her depression and impulsivity with her borderline personality.  Risk of harm to others is minimal in that she has not been involved in fights or had any legal charges filed on her.  Treatment Plan Summary: Daily contact with patient to assess and evaluate symptoms and progress in treatment Medication management  Plan: Admit, continue her effective medications and consider D/C tomorrow. We will continue on q. 15 checks the unit protocol. At this time there is no clinical indication for one-to-one observation as patient contract for safety and presents little risk to harm themself and others.  We will increase collateral information. I encourage patient to participate in group milieu therapy. Pt will be seen in treatment team soon for further treatment and appropriate discharge planning. Please see history and physical note for more detailed information ELOS: 3 to 5 days.   Dorcas Melito 06/02/2012, 11:34 PM

## 2012-06-02 NOTE — H&P (Signed)
Psychiatric Admission Assessment Adult  Patient Identification:  [name]  Date of Evaluation:  06/02/2012  Chief Complaint:  Bipolar Disorder, Depressed Mood  History of Present Illness: This is one of numerous admissions in this hospital for this 26 year old Caucasian female. Admitted from the [facility] ED with complaint of suicide attempt by overdose. Patient apparently reacted this way because of a relationship break-up. Patient reports, I feel light headed right now. That is why I am lying the bed. For the past 2 weeks, my depression got worse. My boy-friend and I we keep arguing.  Then my boyfriend told me that we are breaking up, my depression worsened. I felt like I don't want to live no more. So, I took a bunch of ASA pills to commit suicide. But my mother found out and that was how I ended up at the [facility] ED. I am feeling better now because my boyfriend and I made up and we are couple again. I am not suicidal any more"  Mood Symptoms:  Mood Swings, Past 2 Weeks, Sadness, SI, Worthlessness,  Depression Symptoms:  depressed mood, suicidal thoughts with specific plan, suicidal attempt,  (Hypo) Manic Symptoms:  Impulsivity, Irritable Mood,  Anxiety Symptoms:  Excessive Worry,  Psychotic Symptoms:  Hallucinations: None  PTSD Symptoms: Had a traumatic exposure:  None reported  Past Psychiatric History: Diagnosis: Bipolar affective disorder  Hospitalizations: [facility] x 6  Outpatient Care: [facility] clinic with Dr, Lolly Mustache  Substance Abuse Care: None reported  Self-Mutilation: "I cut on my arms to feel better and or numb my pain"  Suicidal Attempts: "Yes, I took bunch of ASA pills"  Violent Behaviors: None reported   Past Medical History:   Past Medical History  Diagnosis Date  . Bipolar disorder   . Attention deficit hyperactivity disorder   . Depression   . Anxiety   . Asthma     Allergies:   Allergies  Allergen Reactions  .  Divalproex Sodium     Pt had h/o intolerance from Depakote. Stated that she never wants this med.  . Methylphenidate Derivatives     Gets depressed and lots of anger   PTA Medications: Prescriptions prior to admission  Medication Sig Dispense Refill  . citalopram (CELEXA) 20 MG tablet Take 1 tablet (20 mg total) by mouth at bedtime. For depression.  30 tablet  0  . lamoTRIgine (LAMICTAL) 150 MG tablet Take 1 tablet (150 mg total) by mouth 2 (two) times daily. For mood control.  60 tablet  0  . pantoprazole (PROTONIX) 40 MG tablet Take 1 tablet (40 mg total) by mouth daily.  31 tablet  0  . trazodone (DESYREL) 300 MG tablet Take 1 tablet (300 mg total) by mouth at bedtime. For insomnia.  30 tablet  0     Substance Abuse History in the last 12 months: Substance Age of 1st Use Last Use Amount Specific Type  Nicotine " I don't smoke, drink alcohol and or use drugs"     Alcohol      Cannabis      Opiates      Cocaine      Methamphetamines "I take ADHD medication sometimes"     LSD      Ecstasy      Benzodiazepines      Caffeine      Inhalants      Others:  Consequences of Substance Abuse: Medical Consequences:  Liver damage Legal Consequences:  Arrests, jail time Family Consequences:  Family discord  Social History: Current Place of Residence: [place] of Birth:  [place]  Family Members: "My mother"  Marital Status:  Single  Children: 0  Sons: 0  Daughters: 0  Relationships: "My boy-friend broke up with me"  Education:  HS Graduate  Educational Problems/Performance: None reported  Religious Beliefs/Practices: None reported  History of Abuse (Emotional/Phsycial/Sexual): None reported  Occupational Experiences: English as a second language teacher History:  None.  Legal History: none reported  Hobbies/Interests: None reported  Family History:   Family History  Problem Relation Age of Onset  . Depression Mother   . Depression Father    . Depression Brother     Mental Status Examination/Evaluation: Objective:  Appearance: Casual  Eye Contact::  Good  Speech:  Clear and Coherent  Volume:  Normal  Mood:  Depressed  Affect:  Flat  Thought Process:  Coherent  Orientation:  Full  Thought Content:  Rumination  Suicidal Thoughts:  No  Homicidal Thoughts:  No  Memory:  Immediate;   Good Recent;   Good Remote;   Good  Judgement:  Poor  Insight:  Lacking  Psychomotor Activity:  Normal  Concentration:  Good  Recall:  Good  Akathisia:  No  Handed:  Right  AIMS (if indicated):     Assets:  Financial Resources/Insurance  Sleep:  Number of Hours: 5.75     Laboratory/X-Ray: None Psychological Evaluation(s)      Assessment:    AXIS I:  Bipolar affective disorder. AXIS II:  Cluster B Traits AXIS III:   Past Medical History  Diagnosis Date  . Bipolar disorder   . Attention deficit hyperactivity disorder   . Depression   . Anxiety   . Asthma    AXIS IV:  other psychosocial or environmental problems AXIS V:  11-20 some danger of hurting self or others possible OR occasionally fails to maintain minimal personal hygiene OR gross impairment in communication  Treatment Plan/Recommendations: Admit for safety and stabilization. Review and reinstate any pertinent home medications for other health condition. Continue current treatment plan.   Treatment Plan Summary: Daily contact with patient to assess and evaluate symptoms and progress in treatment Medication management  Current Medications:  Current Facility-Administered Medications  Medication Dose Route Frequency Provider Last Rate Last Dose  . acetaminophen (TYLENOL) tablet 650 mg  650 mg Oral Q6H PRN Wonda Cerise, MD      . alum & mag hydroxide-simeth (MAALOX/MYLANTA) 200-200-20 MG/5ML suspension 30 mL  30 mL Oral Q4H PRN Wonda Cerise, MD      . citalopram (CELEXA) tablet 20 mg  20 mg Oral Daily Wonda Cerise, MD   20 mg at 06/02/12 0814  . magnesium hydroxide  (MILK OF MAGNESIA) suspension 30 mL  30 mL Oral Daily PRN Wonda Cerise, MD      . pantoprazole (PROTONIX) EC tablet 40 mg  40 mg Oral Q breakfast Wonda Cerise, MD   40 mg at 06/02/12 0814  . traZODone (DESYREL) tablet 300 mg  300 mg Oral QHS Wonda Cerise, MD   300 mg at 06/01/12 2308   Facility-Administered Medications Ordered in Other Encounters  Medication Dose Route Frequency Provider Last Rate Last Dose  . DISCONTD: acetaminophen (TYLENOL) tablet 650 mg  650 mg Oral Q4H PRN Dorthula Matas, PA      . DISCONTD: alum & mag hydroxide-simeth (MAALOX/MYLANTA) 200-200-20 MG/5ML suspension 30 mL  30  mL Oral PRN Dorthula Matas, PA      . DISCONTD: ibuprofen (ADVIL,MOTRIN) tablet 600 mg  600 mg Oral Q8H PRN Dorthula Matas, PA      . DISCONTD: nicotine (NICODERM CQ - dosed in mg/24 hours) patch 21 mg  21 mg Transdermal Daily Dorthula Matas, PA      . DISCONTD: ondansetron (ZOFRAN) tablet 4 mg  4 mg Oral Q8H PRN Dorthula Matas, PA      . DISCONTD: zolpidem (AMBIEN) tablet 5 mg  5 mg Oral QHS PRN Dorthula Matas, PA        Observation Level/Precautions:  Q 15 minute checks for safety  Laboratory:  Reviewed ED lab findings on file.  Psychotherapy: Group   Medications:  See medication lists  Routine PRN Medications:  Yes  Consultations:  None indicated  Discharge Concerns: Safety   Other:     Armandina Stammer I 6/19/201312:28 PM

## 2012-06-02 NOTE — [facility] Suicide Risk Assessment (Signed)
Suicide Risk Assessment  Discharge Assessment     Demographic factors:  Caucasian;Low socioeconomic status;Unemployed;Adolescent or young adult    Current Mental Status Per Nursing Assessment::   On Admission:   (currently denies SI) At Discharge:     Current Mental Status Per Physician:  Loss Factors: Loss of significant relationship;Financial problems / change in socioeconomic status  Historical Factors: Prior suicide attempts;Family history of mental illness or substance abuse  Risk Reduction Factors:      Continued Clinical Symptoms:  Bipolar Disorder:   Bipolar II Personality Disorders:   Cluster B Previous Psychiatric Diagnoses and Treatments  Discharge Diagnoses:   AXIS I:  Bipolar, Depressed AXIS II:  Borderline Personality Dis. AXIS III:   Past Medical History  Diagnosis Date  . Bipolar disorder   . Attention deficit hyperactivity disorder   . Depression   . Anxiety   . Asthma    AXIS IV:  other psychosocial or environmental problems AXIS V:  61-70 mild symptoms  Cognitive Features That Contribute To Risk:  Thought constriction (tunnel vision)    Suicide Risk:  Minimal: No identifiable suicidal ideation.  Patients presenting with no risk factors but with morbid ruminations; may be classified as minimal risk based on the severity of the depressive symptoms  Diagnosis:   Axis I: Bipolar, Depressed Axis II: Borderline Personality Dis. Axis III:  Past Medical History  Diagnosis Date  . Bipolar disorder   . Attention deficit hyperactivity disorder   . Depression   . Anxiety   . Asthma     ADL's:  Intact  Sleep: Fair  Appetite:  Fair  Suicidal Ideation:  Pt denies suicidal thoughts. Homicidal Ideation:  Denies adamantly any homicidal thoughts.  Mental Status Examination/Evaluation: Objective:  Appearance: Casual  Eye Contact::  Good  Speech:  Clear and Coherent  Volume:  Normal  Mood:  Anxious and Dysphoric  Affect:  Congruent    Thought Process:  Coherent  Orientation:  Full  Thought Content:  WDL  Suicidal Thoughts:  No  Homicidal Thoughts:  No  Memory:  Immediate;   Fair  Judgement:  Fair  Insight:  Fair  Psychomotor Activity:  Normal  Concentration:  Fair  Recall:  Fair  Akathisia:  No  Handed:    AIMS (if indicated):     Assets:  Communication Skills Desire for Improvement  Sleep:  Number of Hours: 5.75    Vital Signs:Blood pressure 101/68, pulse 84, temperature 98.6 F (37 C), temperature source Oral, resp. rate 16, height 5\' 4"  (1.626 m), weight 98.884 kg (218 lb), last menstrual period 04/14/2012. Current Medications: Current Facility-Administered Medications  Medication Dose Route Frequency Provider Last Rate Last Dose  . acetaminophen (TYLENOL) tablet 650 mg  650 mg Oral Q6H PRN Wonda Cerise, MD      . alum & mag hydroxide-simeth (MAALOX/MYLANTA) 200-200-20 MG/5ML suspension 30 mL  30 mL Oral Q4H PRN Wonda Cerise, MD      . citalopram (CELEXA) tablet 20 mg  20 mg Oral QHS Sanjuana Kava, NP   20 mg at 06/02/12 2311  . lamoTRIgine (LAMICTAL) tablet 150 mg  150 mg Oral BID Sanjuana Kava, NP   150 mg at 06/02/12 1714  . magnesium hydroxide (MILK OF MAGNESIA) suspension 30 mL  30 mL Oral Daily PRN Wonda Cerise, MD      . pantoprazole (PROTONIX) EC tablet 40 mg  40 mg Oral Q breakfast Wonda Cerise, MD   40 mg at 06/02/12 0814  . traZODone (  DESYREL) tablet 300 mg  300 mg Oral QHS Wonda Cerise, MD   300 mg at 06/02/12 2311  . DISCONTD: citalopram (CELEXA) tablet 20 mg  20 mg Oral Daily Wonda Cerise, MD   20 mg at 06/02/12 0814  . DISCONTD: pantoprazole (PROTONIX) EC tablet 40 mg  40 mg Oral Daily Sanjuana Kava, NP      . DISCONTD: traZODone (DESYREL) tablet 300 mg  300 mg Oral QHS Sanjuana Kava, NP        Lab Results:  Results for orders placed during the hospital encounter of 05/31/12 (from the past 72 hour(s))  COMPREHENSIVE METABOLIC PANEL     Status: Abnormal   Collection Time   05/31/12  8:55 PM       Component Value Range Comment   Sodium 138  135 - 145 mEq/L    Potassium 4.0  3.5 - 5.1 mEq/L    Chloride 103  96 - 112 mEq/L    CO2 27  19 - 32 mEq/L    Glucose, Bld 90  70 - 99 mg/dL    BUN 8  6 - 23 mg/dL    Creatinine, Ser 7.82  0.50 - 1.10 mg/dL    Calcium 9.7  8.4 - 95.6 mg/dL    Total Protein 7.1  6.0 - 8.3 g/dL    Albumin 3.7  3.5 - 5.2 g/dL    AST 13  0 - 37 U/L    ALT 9  0 - 35 U/L    Alkaline Phosphatase 74  39 - 117 U/L    Total Bilirubin 0.1 (*) 0.3 - 1.2 mg/dL    GFR calc non Af Amer 81 (*) >90 mL/min    GFR calc Af Amer >90  >90 mL/min   SALICYLATE LEVEL     Status: Abnormal   Collection Time   05/31/12  8:56 PM      Component Value Range Comment   Salicylate Lvl 24.2 (*) 2.8 - 20.0 mg/dL   ACETAMINOPHEN LEVEL     Status: Normal   Collection Time   05/31/12  8:56 PM      Component Value Range Comment   Acetaminophen (Tylenol), Serum <15.0  10 - 30 ug/mL   CBC     Status: Normal   Collection Time   05/31/12  8:56 PM      Component Value Range Comment   WBC 6.6  4.0 - 10.5 K/uL    RBC 4.36  3.87 - 5.11 MIL/uL    Hemoglobin 13.2  12.0 - 15.0 g/dL    HCT 21.3  08.6 - 57.8 %    MCV 93.3  78.0 - 100.0 fL    MCH 30.3  26.0 - 34.0 pg    MCHC 32.4  30.0 - 36.0 g/dL    RDW 46.9  62.9 - 52.8 %    Platelets 202  150 - 400 K/uL   ETHANOL     Status: Normal   Collection Time   05/31/12  8:56 PM      Component Value Range Comment   Alcohol, Ethyl (B) <11  0 - 11 mg/dL   CARDIAC PANEL(CRET KIN+CKTOT+MB+TROPI)     Status: Normal   Collection Time   05/31/12  8:56 PM      Component Value Range Comment   Total CK 151  7 - 177 U/L    CK, MB 1.3  0.3 - 4.0 ng/mL    Troponin I <0.30  <0.30 ng/mL  Relative Index 0.9  0.0 - 2.5   URINE RAPID DRUG SCREEN (HOSP PERFORMED)     Status: Abnormal   Collection Time   05/31/12  9:01 PM      Component Value Range Comment   Opiates NONE DETECTED  NONE DETECTED    Cocaine NONE DETECTED  NONE DETECTED    Benzodiazepines NONE  DETECTED  NONE DETECTED    Amphetamines POSITIVE (*) NONE DETECTED    Tetrahydrocannabinol NONE DETECTED  NONE DETECTED    Barbiturates NONE DETECTED  NONE DETECTED   PREGNANCY, URINE     Status: Normal   Collection Time   05/31/12  9:01 PM      Component Value Range Comment   Preg Test, Ur NEGATIVE  NEGATIVE   URINALYSIS, ROUTINE W REFLEX MICROSCOPIC     Status: Abnormal   Collection Time   05/31/12  9:01 PM      Component Value Range Comment   Color, Urine YELLOW  YELLOW    APPearance CLOUDY (*) CLEAR    Specific Gravity, Urine 1.035 (*) 1.005 - 1.030    pH 6.0  5.0 - 8.0    Glucose, UA NEGATIVE  NEGATIVE mg/dL    Hgb urine dipstick NEGATIVE  NEGATIVE    Bilirubin Urine NEGATIVE  NEGATIVE    Ketones, ur NEGATIVE  NEGATIVE mg/dL    Protein, ur NEGATIVE  NEGATIVE mg/dL    Urobilinogen, UA 0.2  0.0 - 1.0 mg/dL    Nitrite NEGATIVE  NEGATIVE    Leukocytes, UA NEGATIVE  NEGATIVE MICROSCOPIC NOT DONE ON URINES WITH NEGATIVE PROTEIN, BLOOD, LEUKOCYTES, NITRITE, OR GLUCOSE <1000 mg/dL.  BLOOD GAS, ARTERIAL     Status: Abnormal   Collection Time   05/31/12  9:07 PM      Component Value Range Comment   FIO2 0.21      pH, Arterial 7.429 (*) 7.350 - 7.400    pCO2 arterial 35.8  35.0 - 45.0 mmHg    pO2, Arterial 108.0 (*) 80.0 - 100.0 mmHg    Bicarbonate 23.3  20.0 - 24.0 mEq/L    TCO2 20.5  0 - 100 mmol/L    Acid-base deficit 0.1  0.0 - 2.0 mmol/L    O2 Saturation 98.7      Patient temperature 98.6      Collection site RIGHT RADIAL      Drawn by [id number]      Sample type ARTERIAL DRAW      Allens test (pass/fail) PASS  PASS   SALICYLATE LEVEL     Status: Abnormal   Collection Time   05/31/12 10:58 PM      Component Value Range Comment   Salicylate Lvl 26.4 (*) 2.8 - 20.0 mg/dL   COMPREHENSIVE METABOLIC PANEL     Status: Abnormal   Collection Time   05/31/12 10:58 PM      Component Value Range Comment   Sodium 138  135 - 145 mEq/L    Potassium 3.6  3.5 - 5.1 mEq/L    Chloride 105   96 - 112 mEq/L    CO2 23  19 - 32 mEq/L    Glucose, Bld 100 (*) 70 - 99 mg/dL    BUN 8  6 - 23 mg/dL    Creatinine, Ser 0.45  0.50 - 1.10 mg/dL    Calcium 9.1  8.4 - 40.9 mg/dL    Total Protein 6.4  6.0 - 8.3 g/dL    Albumin 3.3 (*) 3.5 - 5.2 g/dL  AST 12  0 - 37 U/L    ALT 9  0 - 35 U/L    Alkaline Phosphatase 69  39 - 117 U/L    Total Bilirubin 0.1 (*) 0.3 - 1.2 mg/dL    GFR calc non Af Amer 85 (*) >90 mL/min    GFR calc Af Amer >90  >90 mL/min   COMPREHENSIVE METABOLIC PANEL     Status: Abnormal   Collection Time   06/01/12  2:25 AM      Component Value Range Comment   Sodium 141  135 - 145 mEq/L    Potassium 3.4 (*) 3.5 - 5.1 mEq/L    Chloride 106  96 - 112 mEq/L    CO2 26  19 - 32 mEq/L    Glucose, Bld 114 (*) 70 - 99 mg/dL    BUN 8  6 - 23 mg/dL    Creatinine, Ser 4.78  0.50 - 1.10 mg/dL    Calcium 8.3 (*) 8.4 - 10.5 mg/dL    Total Protein 5.9 (*) 6.0 - 8.3 g/dL    Albumin 3.1 (*) 3.5 - 5.2 g/dL    AST 11  0 - 37 U/L    ALT 8  0 - 35 U/L    Alkaline Phosphatase 67  39 - 117 U/L    Total Bilirubin 0.1 (*) 0.3 - 1.2 mg/dL    GFR calc non Af Amer 88 (*) >90 mL/min    GFR calc Af Amer >90  >90 mL/min   SALICYLATE LEVEL     Status: Abnormal   Collection Time   06/01/12  2:25 AM      Component Value Range Comment   Salicylate Lvl 20.6 (*) 2.8 - 20.0 mg/dL   CBC     Status: Abnormal   Collection Time   06/01/12  2:25 AM      Component Value Range Comment   WBC 6.6  4.0 - 10.5 K/uL    RBC 3.94  3.87 - 5.11 MIL/uL    Hemoglobin 11.7 (*) 12.0 - 15.0 g/dL    HCT 29.5  62.1 - 30.8 %    MCV 92.6  78.0 - 100.0 fL    MCH 29.7  26.0 - 34.0 pg    MCHC 32.1  30.0 - 36.0 g/dL    RDW 65.7  84.6 - 96.2 %    Platelets 181  150 - 400 K/uL   COMPREHENSIVE METABOLIC PANEL     Status: Abnormal   Collection Time   06/01/12  5:30 AM      Component Value Range Comment   Sodium 139  135 - 145 mEq/L    Potassium 3.8  3.5 - 5.1 mEq/L    Chloride 104  96 - 112 mEq/L    CO2 28  19 - 32  mEq/L    Glucose, Bld 94  70 - 99 mg/dL    BUN 8  6 - 23 mg/dL    Creatinine, Ser 9.52  0.50 - 1.10 mg/dL    Calcium 8.4  8.4 - 84.1 mg/dL    Total Protein 5.9 (*) 6.0 - 8.3 g/dL    Albumin 3.0 (*) 3.5 - 5.2 g/dL    AST 11  0 - 37 U/L    ALT 8  0 - 35 U/L    Alkaline Phosphatase 66  39 - 117 U/L    Total Bilirubin 0.1 (*) 0.3 - 1.2 mg/dL    GFR calc non Af Amer >90  >  90 mL/min    GFR calc Af Amer >90  >90 mL/min   ACETAMINOPHEN LEVEL     Status: Normal   Collection Time   06/01/12  5:30 AM      Component Value Range Comment   Acetaminophen (Tylenol), Serum <15.0  10 - 30 ug/mL   SALICYLATE LEVEL     Status: Normal   Collection Time   06/01/12  5:30 AM      Component Value Range Comment   Salicylate Lvl 15.4  2.8 - 20.0 mg/dL     RISK REDUCTION FACTORS: What pt has learned from hospital stay is that laughter is the best medicine, everything happens for a reason, and you have choices.  Risk of self harm is elevated by her mood disorder and her impulsivity with her borderline personality, but she has decided that she has her Steffanie Rainwater and her mother to get better to live for as well as herself to get better.  Risk of harm to others is minimal in that she has not been involved in fights or had any legal charges filed on her.  Pt seen in treatment team where she divulged the above information. The treatment team concluded that she was ready for discharge and had met her goals for an inpatient setting.  PLAN: Discharge home Continue Medication List  As of 06/02/2012 11:43 PM   TAKE these medications      Indication    citalopram 20 MG tablet   Commonly known as: CELEXA   Take 1 tablet (20 mg total) by mouth at bedtime. For depression.       lamoTRIgine 150 MG tablet   Commonly known as: LAMICTAL   Take 1 tablet (150 mg total) by mouth 2 (two) times daily. For mood control.       pantoprazole 40 MG tablet   Commonly known as: PROTONIX   Take 1 tablet (40 mg total) by mouth  daily. For control of stomach acid secretion and helps GERD.       trazodone 300 MG tablet   Commonly known as: DESYREL   Take 1 tablet (300 mg total) by mouth at bedtime. For insomnia.            Follow-up recommendations:  Activities: Resume typical activities Diet: Resume typical diet Other: Follow up with outpatient provider and report any side effects to out patient prescriber.  Plan: Consider D/C tomorrow.  Holman Bonsignore 06/02/2012, 11:40 PM

## 2012-06-02 NOTE — Progress Notes (Signed)
Pt did not attend d/c planning group on this date.  SW met with pt individually at this time.  Pt presents with flat affect and depressed mood.  Pt rates depression and anxiety at a 10 today.  Pt denies SI.  Pt states that she was tired of having headaches and stomach aches and overdosed on aspirin.  Pt states that she wants to treat her mother better.  Pt states that she is able to return home with her mother and her brother.  Pt states that she is seen by Dr. Lolly Mustache for medication management and Lala Lund for therapy.  No further needs voiced by pt at this time.   Reyes Ivan, LCSWA 06/02/2012  11:27 AM    Per State Regulation 482.30 This Chart was reviewed for medical necessity with respect to the patient's Admission/Duration of stay.   [name]  06/02/2012  Next Review Date:  06/04/12

## 2012-06-02 NOTE — Progress Notes (Signed)
Pt is in bed resting with eyes. Respirations even and unlabored. Pt appears to be in no signs of distress at this time. Pt safety remains with q15min checks.  

## 2012-06-02 NOTE — Tx Team (Signed)
Interdisciplinary Treatment Plan Update (Adult)  Date:  06/02/2012  Time Reviewed:  11:20 AM   Progress in Treatment: Attending groups: Yes Participating in groups:  Yes Taking medication as prescribed: Yes Tolerating medication:  Yes Family/Significant other contact made:  Counselor assessing for appropriate contact Patient understands diagnosis:  Yes Discussing patient identified problems/goals with staff:  Yes Medical problems stabilized or resolved:  Yes Denies suicidal/homicidal ideation: Yes Issues/concerns per patient self-inventory:  None identified Other: N/A  New problem(s) identified: None Identified  Reason for Continuation of Hospitalization: Anxiety Depression Medication stabilization Suicidal ideation  Interventions implemented related to continuation of hospitalization: mood stabilization, medication monitoring and adjustment, group therapy and psycho education, safety checks q 15 mins  Additional comments: N/A  Estimated length of stay: 3-5 days  Discharge Plan: Pt will return to her providers for medication management and therapy.    New goal(s): N/A  Review of initial/current patient goals per problem list:    1.  Goal(s): Reduce depressive symptoms  Met:  No  Target date: by discharge  As evidenced by: Reducing depression from a 10 to a 3 as reported by pt.   2.  Goal (s): Reduce/Eliminate suicidal ideation  Met:  No  Target date: by discharge  As evidenced by: pt reporting no SI.    3.  Goal(s): Reduce anxiety symptoms  Met:  No  Target date: by discharge  As evidenced by: Reduce anxiety from a 10 to a 3 as reported by pt.    Attendees: Patient:  [name] 06/02/2012 11:21 AM   Family:     Physician:  Orson Aloe, MD  06/02/2012  11:20 AM   Nursing:   Abbie Sons, RN 06/02/2012 11:21 AM   Case Manager:  Reyes Ivan, LCSWA 06/02/2012  11:20 AM   Counselor:  Angus Palms, LCSW 06/02/2012  11:20 AM   Other:  Juline Patch, LCSW 06/02/2012  11:20 AM   Other:  Clarice Pole, LCASA 06/02/2012  11:20 AM   Other:     Other:      Scribe for Treatment Team:   Carmina Miller, 06/02/2012 , 11:20 AM

## 2012-06-03 NOTE — Progress Notes (Signed)
[facility] Case Management Discharge Plan:  Will you be returning to the same living situation after discharge: Yes,  return home At discharge, do you have transportation home?:Yes,  access to transportation Do you have the ability to pay for your medications:Yes,  access to meds   Release of information consent forms completed and in the chart;  Patient's signature needed at discharge.  Patient to Follow up at:  Follow-up Information    Follow up with Lala Lund, PhD - therapy on 06/08/2012. (Appointment scheduled at 2:00 pm)    Contact information:   [address] [place], [place] [address] [phone]      Follow up with [facility] on 06/23/2012. (Appointment scheduled at 1:30 pm with Dr. Lolly Mustache)    Contact information:   [address] [place], [place] [address] [phone]         Patient denies SI/HI:   Yes,  denies SI/HI    Safety Planning and Suicide Prevention discussed:  Yes,  discussed with pt  Barrier to discharge identified:No.  Summary and Recommendations: Pt attended discharge planning group and actively participated.  Pt presents with calm mood and affect.  Pt denies having depression, anxiety and SI.  Pt reports feeling stable to d/c today.  No recommendations from SW.  No further needs voiced by pt.  Pt stable to discharge.     Carmina Miller 06/03/2012, 9:14 AM

## 2012-06-03 NOTE — Progress Notes (Signed)
Patient discharged to home.  Reviewed all discharge instructions, medications, and follow up care.  Patient verbalized understanding of all.  Denies major depressive symptoms or suicidal ideation at this time.  All belongings collected from the patients room.  No belongings were in lockers out front.  Patient left the unit ambulatory and was escorted to the front lobby where her ride was waiting.

## 2012-06-03 NOTE — Progress Notes (Signed)
[facility] Family/Significant Other Suicide Prevention Education  Suicide Prevention Education:  Contact Attempts: Shaana Acocella, mother, [phone] ) has been identified by the patient as the family member/significant other with whom the patient will be residing, and identified as the person(s) who will aid the patient in the event of a mental health crisis.  With written consent from the patient, two attempts were made to provide suicide prevention education, prior to and/or following the patient's discharge.  We were unsuccessful in providing suicide prevention education.  A suicide education pamphlet was given to the patient to share with family/significant other.  Date and time of first attempt: 06/03/2012   8:24AM  Date and time of second attempt: 06/03/2012 11:21AM  [name] 06/03/2012, 11:20 AM

## 2012-06-03 NOTE — Progress Notes (Signed)
[facility] MD Progress Note  06/03/2012 2:44 PM  Diagnosis:   Axis I: Bipolar, Depressed Axis II: Borderline Personality Dis. Axis III:  Past Medical History  Diagnosis Date  . Bipolar disorder   . Attention deficit hyperactivity disorder   . Depression   . Anxiety   . Asthma     ADL's:  Intact  Sleep: Good  Appetite:  Fair  Suicidal Ideation:  Pt denies suicidal thoughts. Homicidal Ideation:  Denies adamantly any homicidal thoughts.  Mental Status Examination/Evaluation: Objective:  Appearance: Casual  Eye Contact::  Good  Speech:  Clear and Coherent  Volume:  Normal  Mood:  Euthymic  Affect:  Congruent  Thought Process:  Coherent  Orientation:  Full  Thought Content:  WDL  Suicidal Thoughts:  No  Homicidal Thoughts:  No  Memory:  Immediate;   Fair  Judgement:  Fair  Insight:  Fair  Psychomotor Activity:  Normal  Concentration:  Good  Recall:  Good  Akathisia:  No  Handed:    AIMS (if indicated):     Assets:  Communication Skills Desire for Improvement  Sleep:  Number of Hours: 6    Vital Signs:Blood pressure 113/65, pulse 93, temperature 98.2 F (36.8 C), temperature source Oral, resp. rate 18, height 5\' 4"  (1.626 m), weight 98.884 kg (218 lb), last menstrual period 04/14/2012. Current Medications: Current Facility-Administered Medications  Medication Dose Route Frequency Provider Last Rate Last Dose  . DISCONTD: acetaminophen (TYLENOL) tablet 650 mg  650 mg Oral Q6H PRN Wonda Cerise, MD      . DISCONTD: alum & mag hydroxide-simeth (MAALOX/MYLANTA) 200-200-20 MG/5ML suspension 30 mL  30 mL Oral Q4H PRN Wonda Cerise, MD      . DISCONTD: citalopram (CELEXA) tablet 20 mg  20 mg Oral QHS Sanjuana Kava, NP   20 mg at 06/02/12 2311  . DISCONTD: lamoTRIgine (LAMICTAL) tablet 150 mg  150 mg Oral BID Sanjuana Kava, NP   150 mg at 06/03/12 0829  . DISCONTD: magnesium hydroxide (MILK OF MAGNESIA) suspension 30 mL  30 mL Oral Daily PRN Wonda Cerise, MD      . DISCONTD:  pantoprazole (PROTONIX) EC tablet 40 mg  40 mg Oral Q breakfast Wonda Cerise, MD   40 mg at 06/03/12 0829  . DISCONTD: traZODone (DESYREL) tablet 300 mg  300 mg Oral QHS Wonda Cerise, MD   300 mg at 06/02/12 2311   Current Outpatient Prescriptions  Medication Sig Dispense Refill  . citalopram (CELEXA) 20 MG tablet Take 1 tablet (20 mg total) by mouth at bedtime. For depression.  30 tablet  0  . lamoTRIgine (LAMICTAL) 150 MG tablet Take 1 tablet (150 mg total) by mouth 2 (two) times daily. For mood control.  60 tablet  0  . trazodone (DESYREL) 300 MG tablet Take 1 tablet (300 mg total) by mouth at bedtime. For insomnia.  30 tablet  0  . pantoprazole (PROTONIX) 40 MG tablet Take 1 tablet (40 mg total) by mouth daily. For control of stomach acid secretion and helps GERD.  31 tablet  0    Lab Results:  No results found for this or any previous visit (from the past 48 hour(s)).  Physical Findings: AIMS:  , ,  ,  ,    CIWA:    COWS:     Treatment Plan Summary: Daily contact with patient to assess and evaluate symptoms and progress in treatment Medication management  Plan: Pt has met inpatient goals,  Will D/C today.  Eldrick Penick 06/03/2012, 2:44 PM

## 2012-06-03 NOTE — Tx Team (Signed)
Interdisciplinary Treatment Plan Update (Adult)  Date:  06/03/2012  Time Reviewed:  10:43 AM   Progress in Treatment: Attending groups: Yes Participating in groups:  Yes Taking medication as prescribed: Yes Tolerating medication:  Yes Family/Significant other contact made: Yes  Patient understands diagnosis:  Yes Discussing patient identified problems/goals with staff:  Yes Medical problems stabilized or resolved:  Yes Denies suicidal/homicidal ideation: Yes Issues/concerns per patient self-inventory:  None identified Other: N/A  New problem(s) identified: None Identified  Reason for Continuation of Hospitalization: Stable to d/c  Interventions implemented related to continuation of hospitalization: Stable to d/c  Additional comments: N/A  Estimated length of stay: D/C today  Discharge Plan: Pt will follow up with Dr. Lolly Mustache for medication management and Lala Lund for therapy.    New goal(s): N/A  Review of initial/current patient goals per problem list:    1.  Goal(s): Reduce depressive symptoms  Met:  Yes  Target date: by discharge  As evidenced by: Reducing depression from a 10 to a 3 as reported by pt. Pt denies depression.   2.  Goal (s): Reduce/Eliminate suicidal ideation  Met:  Yes  Target date: by discharge  As evidenced by: pt reporting no SI.    3.  Goal(s): Reduce anxiety symptoms  Met:  Yes  Target date: by discharge  As evidenced by: Reduce anxiety from a 10 to a 3 as reported by pt. Pt denies anxiety.     Attendees: Patient:  [name] 06/03/2012 10:44 AM   Family:     Physician:  Orson Aloe, MD  06/03/2012  10:43 AM   Nursing:   Roswell Miners, RN 06/03/2012 10:44 AM   Case Manager:  Reyes Ivan, LCSWA 06/03/2012  10:43 AM   Counselor:  Angus Palms, LCSW 06/03/2012  10:43 AM   Other:  Juline Patch, LCSW 06/03/2012  10:43 AM   Other:  Fritz Pickerel, RN 06/03/2012  10:43 AM   Other:  Corinne Ports, Psych Intern 06/03/2012 10:43  AM   Other:  Clarice Pole, LCASA   06/03/2012 10:43 AM    Scribe for Treatment Team:   Carmina Miller, 06/03/2012 , 10:43 AM

## 2012-06-03 NOTE — Progress Notes (Signed)
Patient ID: [name], female   DOB: 10-14-1986, 26 y.o.   MRN: [id number] D: Pt attended group, but otherwise wasn't observed by the writer interacting with her peers. Pt was from one telephone to another most of the shift. Informed the writer that she's to be discharged tomorrow. Writer asked if her calls were pleasant. Pt smirked and said "yes". Pt stated she plans to return to her mother's house to live.  A: Adm scheduled meds to the pt.  R: Pt responded well and remains safe.

## 2012-06-03 NOTE — H&P (Signed)
Medical/psychiatric screening examination/treatment/procedure(s) were performed by non-physician practitioner and as supervising physician I was immediately available for consultation/collaboration.  I have seen and examined this patient and agree the major elements of this evaluation.  

## 2012-06-04 NOTE — Progress Notes (Signed)
Patient Discharge Instructions:  After Visit Summary (AVS):   Faxed to:  06/04/2012 Access to EMR:  06/04/2012 Psychiatric Admission Assessment Note:   Faxed to:  06/04/2012 Access to EMR:  06/04/2012 Suicide Risk Assessment - Discharge Assessment:   Faxed to:  06/04/2012 Access to EMR:  06/04/2012 Faxed/Sent to the Next Level Care provider:  06/04/2012 Next Level Care Provider Has Access to the EMR, 06/04/2012  Faxed to [facility] - Lala Lund, PhD @ [phone] And records provided to [facility] O/P Dr. Lolly Mustache via [facility]/Epic acces.  Wandra Scot, 06/04/2012, 4:28 PM

## 2012-06-11 NOTE — Discharge Summary (Signed)
Physician Discharge Summary Note  Patient:  [name] is an 26 y.o., female MRN:  [id number] DOB:  [birth year] Patient phone:  [phone] (home)  Patient address:   [address] [place] [address],   Date of Admission:  06/01/2012 Date of Discharge: 06/03/12  Reason for Admission: Suicide attempts by overdose  Discharge Diagnoses: Principal Problem:  *Bipolar affective disorder, depressed, moderate degree   Axis Diagnosis:   AXIS I:  Bipolar affective disorder, depressed, moderate degree AXIS II:  Cluster B Traits AXIS III:   Past Medical History  Diagnosis Date  . Bipolar disorder   . Attention deficit hyperactivity disorder   . Depression   . Anxiety   . Asthma    AXIS IV:  other psychosocial or environmental problems AXIS V:  65  Level of Care:  OP  Hospital Course: This is one of numerous admissions in this hospital for this 26 year old Caucasian female. Admitted from the [facility] ED with complaint of suicide attempt by overdose. Patient apparently reacted this way because of a relationship break-up. Patient reports, I feel light headed right now. That is why I am lying the bed. For the past 2 weeks, my depression got worse. My boy-friend and I we keep arguing. Then my boyfriend told me that we are breaking up, my depression worsened. I felt like I don't want to live no more. So, I took a bunch of ASA pills to commit suicide. But my mother found out and that was how I ended up at the [facility] ED. I am feeling better now because my boyfriend and I made up and we are couple again. I am not suicidal any more".  While a patient in this hospital this time around, [name] was started on Citalopram 20 mg daily for depressive mood, Lamictal 150 mg daily for mood stabilization and Trazodone 300 mg for sleep. She was also enrolled in group counseling sessions and activities which she participated in on daily basis.   Patient attended  treatment team meeting this am and met with the team members. Her symptoms, treatment regimen and response to treatment discussed. Patient endorsed that she is doing well and stable for discharge. She will resume psychiatric care on outpatient basis. She will follow-up with Dr. Lolly Mustache at the [facility] behavioral health outpatient services on 06/23/12 @ 1:30 pm and with Lala Lund on 06:25:13 on [address] for therapy.  Upon discharge, patient adamantly denies suicidal, homicidal ideations, auditory, visual hallucinations and or delusional thinking. As to what pt has learned from this hospital stay, she states that laughter is the best medicine, everything happens for a reason, and you do have choices. Patient left [facility] with all personal belongings via family transport in no apparent distress.  Consults:  None  Significant Diagnostic Studies:  labs: CBC with diss, CMP, Toxicology  Discharge Vitals:   Blood pressure 113/65, pulse 93, temperature 98.2 F (36.8 C), temperature source Oral, resp. rate 18, height 5\' 4"  (1.626 m), weight 98.884 kg (218 lb), last menstrual period 04/14/2012.  Mental Status Exam: See Mental Status Examination and Suicide Risk Assessment completed by Attending Physician prior to discharge.  Discharge destination:  Home  Is patient on multiple antipsychotic therapies at discharge:  No   Has Patient had three or more failed trials of antipsychotic monotherapy by history:  No  Recommended Plan for Multiple Antipsychotic Therapies: NA   Medication List  As of 06/11/2012  4:23 PM  TAKE these medications      Indication    citalopram 20 MG tablet   Commonly known as: CELEXA   Take 1 tablet (20 mg total) by mouth at bedtime. For depression.       lamoTRIgine 150 MG tablet   Commonly known as: LAMICTAL   Take 1 tablet (150 mg total) by mouth 2 (two) times daily. For mood control.       pantoprazole 40 MG tablet   Commonly known as: PROTONIX   Take 1  tablet (40 mg total) by mouth daily. For control of stomach acid secretion and helps GERD.       trazodone 300 MG tablet   Commonly known as: DESYREL   Take 1 tablet (300 mg total) by mouth at bedtime. For insomnia.            Follow-up Information    Follow up with Lala Lund, PhD - therapy on 06/08/2012. (Appointment scheduled at 2:00 pm)    Contact information:   [address] [place], [place] [address] [phone]      Follow up with [facility] on 06/23/2012. (Appointment scheduled at 1:30 pm with Dr. Lolly Mustache)    Contact information:   [address] [place], [place] [address] [phone]         Follow-up recommendations:  Activity:  as tolerated Other:  Keep all scheduled follow-up appointments as recommended.    Comments:  Take all your medications as prescribed by your mental healthcare provider. Report any adverse effects and or reactions from your medicines to your outpatient provider promptly. Patient is instructed and cautioned to not engage in alcohol and or illegal drug use while on prescription medicines. In the event of worsening symptoms, patient is instructed to call the crisis hotline, 911 and or go to the nearest ED for appropriate evaluation and treatment of symptoms.    SignedArmandina Stammer I 06/11/2012, 4:23 PM

## 2012-06-14 NOTE — Discharge Summary (Signed)
I agree with this D/C Summary.  

## 2012-06-21 ENCOUNTER — Ambulatory Visit (HOSPITAL_COMMUNITY): Payer: Medicare Other | Admitting: Psychiatry

## 2012-06-23 ENCOUNTER — Encounter (HOSPITAL_COMMUNITY): Payer: Self-pay | Admitting: Psychiatry

## 2012-06-23 ENCOUNTER — Ambulatory Visit (INDEPENDENT_AMBULATORY_CARE_PROVIDER_SITE_OTHER): Payer: Medicare Other | Admitting: Psychiatry

## 2012-06-23 VITALS — BP 111/78 | HR 75 | Wt 216.0 lb

## 2012-06-23 DIAGNOSIS — F319 Bipolar disorder, unspecified: Secondary | ICD-10-CM

## 2012-06-23 DIAGNOSIS — F988 Other specified behavioral and emotional disorders with onset usually occurring in childhood and adolescence: Secondary | ICD-10-CM

## 2012-06-23 MED ORDER — ARIPIPRAZOLE 10 MG PO TABS: 10.0000 mg | ORAL_TABLET | Freq: Every day | ORAL | Status: DC

## 2012-06-23 NOTE — Progress Notes (Signed)
Chief complaint I was admitted last month at behavioral Center.  I cut my wrist and took overdose on aspirin.      History of present illness. Patient is 26 year old Caucasian female who came for followup appointment with her mother.  Patient was admitted last month at [facility] when she took overdose on aspirin and cut her wrist with knife.  However patient do not require any stitches .  After medical clearance she was admitted to behavioral Health Center for 2 days.  Patient told she became very upset and depressed when she breakup with her boyfriend.  This is her third psychiatric patient in past 10 months.  Patient reported that she is back together and things are going very well.  However I have notice that she has significant issues in her relationship.  She gets very impulsive and agitated when things are not going very well.  Her mother also believe that her relationship is not healthy for her emotional well-being.  No new medication were added when she was in the hospital.  Patient currently taking all her psychiatric medication and reported no side effects.  Patient admitted episodic agitation anger and mood swing but denies any violent or aggressive behavior.  She denies any active or passive suicidal thoughts or homicidal thoughts.  She denies any paranoia or any hallucination.  She is seeing therapist Lala Lund twice a week for counseling.  She's not drinking or using any illegal substance.  Current psychiatric medication Celexa 20 mg daily  Lamictal 150 mg twice a day Trazodone 300 mg at bedtime.    Past psychiatric history Patient has a long history of impulsive behavior with multiple inpatient psychiatric treatment. She has history of suicidal attempt, cutting herself and aggressive behavior. She had history of treatment with Prozac Depakote Klonopin Abilify Concerta and Strattera.  She was recently admitted to behavioral [facility] due to overdose on aspirin and  superficially cut her wrist.  In past year she has been admitted 3 times due to relationship issue and suicidal thinking.  Psychosocial history Patient lives with her mother. She has no children. She was involved in very intense relationship which ended yesterday.  Medical history Obesity, GERD  Labs On 06/01/2012 her albumin is 3.0, total protein 5.9.  Her potassium was 3.4 calcium 8.3 and her hemoglobin was 11.7.  Her liver function test and creatinine were normal.  Mental status examination Patient is mildly obese female who is casually dressed and fairly groomed.  She is tense and frustrated.  Her attention and concentration is distracted.  She has a short attention span.  Her speech is fast.  Her thought processes distracted.  She denies any active or passive suicidal thinking and homicidal thinking.  She denies any auditory or visual hallucination. She has some thought blocking but overall she denies any obsessions or psychosis.  She denies any paranoid thinking at this time.  There were no tremors or shakes.  She's alert and oriented x3.  Her insight judgment and impulse control is fair.  Assessment Axis I bipolar disorder, ADD Axis II borderline intellect Axis III see medical history Axis IV moderate Axis V 50-55  Plan I reviewed recent medication, discharge summary, recent blood test results and progress note.  I do believe patient require another mood stabilizer since Lamictal alone is not working very well.  We have discussed multiple hospitalization in past one year.  In the past she has taken Abilify up to 40 mg and remained fairly stable.  I recommend to restart Abilify 10 mg at bedtime.  She will continue her Celexa Lamictal and trazodone.  We will consider lowering her trazodone in the future.  I explained the risk and benefits including extrapyramidal side effects metabolic side effects and weight gain with Abilify.  I also recommend to see therapist regularly and talk about  her relationship issues with her in detail.  Time spent 30 minutes.  I will see her again in 3 weeks.  Safety plan discussed that in case she started to feel more depressed and having suicidal thoughts or homicidal thoughts and she need to call 911 or go to local emergency room.  Portion of this note is generated with voice dictation software and may contain typographical error.

## 2012-07-05 ENCOUNTER — Encounter (HOSPITAL_COMMUNITY): Payer: Self-pay

## 2012-07-05 ENCOUNTER — Telehealth: Payer: Self-pay | Admitting: *Deleted

## 2012-07-05 ENCOUNTER — Emergency Department (INDEPENDENT_AMBULATORY_CARE_PROVIDER_SITE_OTHER)
Admission: EM | Admit: 2012-07-05 | Discharge: 2012-07-05 | Disposition: A | Discharge status: Home / Self Care | Payer: Medicare Other | Source: Home / Self Care | Attending: Emergency Medicine | Admitting: Emergency Medicine

## 2012-07-05 DIAGNOSIS — A499 Bacterial infection, unspecified: Secondary | ICD-10-CM

## 2012-07-05 DIAGNOSIS — B373 Candidiasis of vulva and vagina: Secondary | ICD-10-CM

## 2012-07-05 DIAGNOSIS — N760 Acute vaginitis: Secondary | ICD-10-CM

## 2012-07-05 DIAGNOSIS — B9689 Other specified bacterial agents as the cause of diseases classified elsewhere: Secondary | ICD-10-CM

## 2012-07-05 DIAGNOSIS — B3731 Acute candidiasis of vulva and vagina: Secondary | ICD-10-CM

## 2012-07-05 LAB — WET PREP, GENITAL
Clue Cells Wet Prep HPF POC: NONE SEEN
Trich, Wet Prep: NONE SEEN
Yeast Wet Prep HPF POC: NONE SEEN

## 2012-07-05 LAB — POCT URINALYSIS DIP (DEVICE)
Glucose, UA: NEGATIVE mg/dL
Hgb urine dipstick: NEGATIVE
Ketones, ur: NEGATIVE mg/dL
Leukocytes, UA: NEGATIVE
Nitrite: NEGATIVE
Protein, ur: 30 mg/dL — AB
Specific Gravity, Urine: >=1.03 (ref 1.005–1.030)
Urobilinogen, UA: 0.2 mg/dL (ref 0.0–1.0)
pH: 5.5 (ref 5.0–8.0)

## 2012-07-05 LAB — POCT PREGNANCY, URINE: Preg Test, Ur: NEGATIVE

## 2012-07-05 MED ORDER — METRONIDAZOLE 500 MG PO TABS: 500.0000 mg | ORAL_TABLET | Freq: Two times a day (BID) | ORAL | Status: AC

## 2012-07-05 MED ORDER — TERCONAZOLE 0.4 % VA CREA: 1.0000 | TOPICAL_CREAM | Freq: Every day | VAGINAL | Status: AC

## 2012-07-05 NOTE — Discharge Instructions (Signed)
To restore the normal balance of "good bacteria" in your system.  Take a probiotic once daily.  These can be gotten over the counter at the drug store without a prescription and come under various brand names such as Culturelle, Align, Florastore, and [employer].  The best thing to do is to ask your pharmacist to recommend a good probiotic that is not too expensive.   Candidal Vulvovaginitis Candidal vulvovaginitis is an infection of the vagina and vulva. The vulva is the skin around the opening of the vagina. This may cause itching and discomfort in and around the vagina.  HOME CARE  Only take medicine as told by your doctor.   Do not have sex (intercourse) until the infection is healed or as told by your doctor.   Practice safe sex.   Tell your sex partner about your infection.   Do not douche or use tampons.   Wear cotton underwear. Do not wear tight pants or panty hose.   Eat yogurt. This may help treat and prevent yeast infections.  GET HELP RIGHT AWAY IF:   You have a fever.   Your problems get worse during treatment or do not get better in 3 days.   You have discomfort, irritation, or itching in your vagina or vulva area.   You have pain after sex.   You start to get belly (abdominal) pain.  MAKE SURE YOU:  Understand these instructions.   Will watch your condition.   Will get help right away if you are not doing well or get worse.  Document Released: 02/27/2009 Document Revised: 11/20/2011 Document Reviewed: 02/27/2009 [facility] Patient Information 2012 [place], [place].  Bacterial Vaginosis Bacterial vaginosis is an infection of the vagina. A healthy vagina has many kinds of good germs (bacteria). Sometimes the number of good germs can change. This allows bad germs to move in and cause an infection. You may be given medicine (antibiotics) to treat the infection. Or, you may not need treatment at all. HOME CARE  Take your medicine as told. Finish them even if you start  to feel better.   Do not have sex until you finish your medicine.   Do not douche.   Practice safe sex.   Tell your sex partner that you have an infection. They should see their doctor for treatment if they have problems.  GET HELP RIGHT AWAY IF:  You do not get better after 3 days of treatment.   You have grey fluid (discharge) coming from your vagina.   You have pain.   You have a temperature of 102 F (38.9 C) or higher.  MAKE SURE YOU:   Understand these instructions.   Will watch your condition.   Will get help right away if you are not doing well or get worse.  Document Released: 09/09/2008 Document Revised: 11/20/2011 Document Reviewed: 09/09/2008 [facility] Patient Information 2012 [place], [place].

## 2012-07-05 NOTE — Telephone Encounter (Signed)
Pt called has vaginal discharge - pt states she gets every year - unsure of name of med. Pt aware no openings for today but will try to get in as soon as possibe. Has no money to try OTC creams. Pt states will go to Urgent Care. Stanton Kidney Culley Hedeen RN 07/05/12 12:30PM

## 2012-07-05 NOTE — ED Provider Notes (Signed)
Chief Complaint  Patient presents with  . Vaginal Discharge    History of Present Illness:   [name] is a 26 year old female who presents today with a 2-3 day history of a brown discharge which is somewhat malodorous. She's also had vulvar and vaginal itching, and vaginal pain. It hurts and burns to urinate, but she denies any frequency or urgency. She's had no lower back pain, pelvic pain, fever, chills, nausea, or vomiting. She has an Implanad and has not had menses. She has not been sexually active in about 6 months. She does have a history of yeast in the past but denies any history of bacterial vaginosis or Trichomonas. She denies any history of STDs such as gonorrhea or chlamydia.  Review of Systems:  Other than noted above, the patient denies any of the following symptoms: Systemic:  No fever, chills, sweats, fatigue, or weight loss. GI:  No abdominal pain, nausea, anorexia, vomiting, diarrhea, constipation, melena or hematochezia. GU:  No dysuria, frequency, urgency, hematuria, vaginal discharge, itching, or abnormal vaginal bleeding. Skin:  No rash or itching.   PMFSH:  Past medical history, family history, social history, meds, and allergies were reviewed.  Physical Exam:   Vital signs:  BP 112/79  Pulse 73  Temp 99.4 F (37.4 C) (Oral)  Resp 18  SpO2 96% General:  Alert, oriented and in no distress. Lungs:  Breath sounds clear and equal bilaterally.  No wheezes, rales or rhonchi. Heart:  Regular rhythm.  No gallops or murmers. Abdomen:  Soft, flat and non-distended.  No organomegaly or mass.  No tenderness, guarding or rebound.  Bowel sounds normally active. Pelvic exam:  Normal external genitalia, vaginal and cervical mucosa were normal, no cervical motion tenderness. Uterus is midposition and normal in size and shape. She has mild uterine tenderness to palpation and mild bilateral adnexal tenderness to palpation without any masses. Skin:  Clear, warm and dry.  Labs:     Results for orders placed during the hospital encounter of 07/05/12  POCT URINALYSIS DIP (DEVICE)      Component Value Range   Glucose, UA NEGATIVE  NEGATIVE mg/dL   Bilirubin Urine SMALL (*) NEGATIVE   Ketones, ur NEGATIVE  NEGATIVE mg/dL   Specific Gravity, Urine >=1.030  1.005 - 1.030   Hgb urine dipstick NEGATIVE  NEGATIVE   pH 5.5  5.0 - 8.0   Protein, ur 30 (*) NEGATIVE mg/dL   Urobilinogen, UA 0.2  0.0 - 1.0 mg/dL   Nitrite NEGATIVE  NEGATIVE   Leukocytes, UA NEGATIVE  NEGATIVE  POCT PREGNANCY, URINE      Component Value Range   Preg Test, Ur NEGATIVE  NEGATIVE     Assessment:  The primary encounter diagnosis was Candida vaginitis. A diagnosis of Bacterial vaginosis was also pertinent to this visit.  Plan:   1.  The following meds were prescribed:   New Prescriptions   METRONIDAZOLE (FLAGYL) 500 MG TABLET    Take 1 tablet (500 mg total) by mouth 2 (two) times daily.   TERCONAZOLE (TERAZOL 7) 0.4 % VAGINAL CREAM    Place 1 applicator vaginally at bedtime.   2.  The patient was instructed in symptomatic care and handouts were given. 3.  The patient was told to return if becoming worse in any way, if no better in 3 or 4 days, and given some red flag symptoms that would indicate earlier return.    Reuben Likes, MD 07/05/12 [phone]

## 2012-07-05 NOTE — ED Notes (Signed)
C/o vaginal discharge, itching and odor.  Also c/o intermittent burning with urination.  Sx for 3-4 days.

## 2012-07-06 LAB — GC/CHLAMYDIA PROBE AMP, GENITAL
Chlamydia, DNA Probe: NEGATIVE
GC Probe Amp, Genital: NEGATIVE

## 2012-07-07 ENCOUNTER — Encounter (HOSPITAL_COMMUNITY): Payer: Self-pay | Admitting: Psychiatry

## 2012-07-07 ENCOUNTER — Ambulatory Visit (INDEPENDENT_AMBULATORY_CARE_PROVIDER_SITE_OTHER): Payer: Medicare Other | Admitting: Psychiatry

## 2012-07-07 VITALS — BP 117/68 | HR 68 | Wt 218.0 lb

## 2012-07-07 DIAGNOSIS — F988 Other specified behavioral and emotional disorders with onset usually occurring in childhood and adolescence: Secondary | ICD-10-CM

## 2012-07-07 DIAGNOSIS — F319 Bipolar disorder, unspecified: Secondary | ICD-10-CM

## 2012-07-07 MED ORDER — TRAZODONE HCL 300 MG PO TABS: 300.0000 mg | ORAL_TABLET | Freq: Every day | ORAL | Status: DC

## 2012-07-07 MED ORDER — CITALOPRAM HYDROBROMIDE 20 MG PO TABS: 20.0000 mg | ORAL_TABLET | Freq: Every day | ORAL | Status: DC

## 2012-07-07 MED ORDER — ARIPIPRAZOLE 20 MG PO TABS: 20.0000 mg | ORAL_TABLET | Freq: Every day | ORAL | Status: DC

## 2012-07-07 MED ORDER — LAMOTRIGINE 150 MG PO TABS: 150.0000 mg | ORAL_TABLET | Freq: Two times a day (BID) | ORAL | Status: DC

## 2012-07-07 NOTE — Progress Notes (Signed)
Chief complaint I had yeast infection .  Sometime I don't take my medication on time.        History of present illness. Patient is 26 year old Caucasian female who came for followup appointment with her mother.  Patient was seen to his ago for yeast infection.  She is taking antibiotic.  Patient continued to have mood swing anger and agitation.  Her mother is very concerned about her behavior and poorly compliant with her psychiatric medication.  She sleeps very late at night due to talking to her boyfriend and then she wakes up very late and some time she do not take her medication.  On her last visit we started her Abilify 10 mg .  Patient denies any side effects of Abilify.  In the past she has taken Abilify up to 40 mg.  She continues to have poor attention poor concentration and impulsive behavior.  She is seeing therapist however mother belief that she had a friendship relationship with her therapist.  Geronimo Running wondering if she can see a therapist in this office.  Patient continues to have agitation anger and mood lability.  She has any tremors or shakes.  She has any aggression or violence however requires constant redirection and reminder her medication compliance.  She denies any hallucination or any paranoid thinking.  She's not drinking or using any illegal substance.  Current psychiatric medication Celexa 20 mg daily  Lamictal 150 mg twice a day Abilify 10 mg daily  Trazodone 300 mg at bedtime.    Past psychiatric history Patient has a long history of impulsive behavior with multiple inpatient psychiatric treatment. She has history of suicidal attempt, cutting herself and aggressive behavior. She had history of treatment with Prozac Depakote Klonopin Abilify Concerta and Strattera.  She was [facility] due to overdose on aspirin and superficially cut her wrist.  In past year she has been admitted 3 times due to relationship issue and suicidal  thinking.  Psychosocial history Patient lives with her mother. She has no children. She was involved in very intense relationship which ended yesterday.  Medical history Obesity, GERD patient recently had yeast infection and taking antibiotic.  Labs On 06/01/2012 her albumin is 3.0, total protein 5.9.  Her potassium was 3.4 calcium 8.3 and her hemoglobin was 11.7.  Her liver function test and creatinine were normal.  Mental status examination Patient is mildly obese female who is casually dressed and fairly groomed.  She is  superficially cooperative and easily distracted and her conversation.  She appears tense and frustrated when her mother describe the symptoms.  Her attention and concentration is distracted.  She has a short attention span.  Her speech is fast.  Her thought processes distracted.  She denies any active or passive suicidal thinking and homicidal thinking.  She denies any auditory or visual hallucination. She has some thought blocking but overall she denies any obsessions or psychosis.  She denies any paranoid thinking at this time.  There were no tremors or shakes.  She's alert and oriented x3.  Her insight judgment and impulse control is fair.  Assessment Axis I bipolar disorder, ADD Axis II borderline intellect Axis III see medical history Axis IV moderate Axis V 50-55  Plan I reviewed recent medication,  recent urinalysis and progress note.   I will increase her Abilify to 20 mg daily.  In the past patient was taking 40 mg without any side effects.  I offered to see therapist in this  office as a second opinion.  Patient and her mother agreed with the plan.  Patient still has residual symptoms of bipolar disorder the at she has ADD however she is not taking any stimulant which had cost increase impulsive behavior in the past.  I discussed in detail the risks of poorly compliant with the medication .  Time spent 30 minutes.  I will see her again in 4-6 weeks.  Patient is  scheduled to see Lean Yates for counseling and therapy in this office.  Portion of this note is generated with voice dictation software and may contain typographical error.

## 2012-07-28 DIAGNOSIS — E669 Obesity, unspecified: Secondary | ICD-10-CM | POA: Insufficient documentation

## 2012-08-03 ENCOUNTER — Ambulatory Visit (INDEPENDENT_AMBULATORY_CARE_PROVIDER_SITE_OTHER): Payer: Medicare Other | Admitting: Internal Medicine

## 2012-08-03 ENCOUNTER — Telehealth: Payer: Self-pay | Admitting: *Deleted

## 2012-08-03 ENCOUNTER — Encounter: Payer: Self-pay | Admitting: Internal Medicine

## 2012-08-03 VITALS — BP 113/74 | HR 92 | Temp 98.4°F | Resp 20 | Ht 65.5 in | Wt 225.7 lb

## 2012-08-03 DIAGNOSIS — R300 Dysuria: Secondary | ICD-10-CM | POA: Insufficient documentation

## 2012-08-03 DIAGNOSIS — F3132 Bipolar disorder, current episode depressed, moderate: Secondary | ICD-10-CM

## 2012-08-03 DIAGNOSIS — F1910 Other psychoactive substance abuse, uncomplicated: Secondary | ICD-10-CM

## 2012-08-03 DIAGNOSIS — E669 Obesity, unspecified: Secondary | ICD-10-CM

## 2012-08-03 LAB — POCT URINE PREGNANCY: Preg Test, Ur: NEGATIVE

## 2012-08-03 MED ORDER — CIPROFLOXACIN HCL 250 MG PO TABS: 250.0000 mg | ORAL_TABLET | Freq: Two times a day (BID) | ORAL | Status: DC

## 2012-08-03 NOTE — Assessment & Plan Note (Signed)
Patient is interested in weight loss  - I spent over 30 minutes discussing with her about weight loss plan -I recommend her to start with diet and exercise -Written information regarding calorie count diet and exercise plans given to patient -Patient and her mother understand the plan

## 2012-08-03 NOTE — Telephone Encounter (Signed)
Pt called stated she wanted an appt, very hard to obtain information from her, i ask 3 times for her complete name, twice for her birthdate and it continued, she finally stated "when i pee, i need to see the doctor" finally she states she is having burning, small voids, freq of voiding. appt is given for 1415 dr Dierdre Searles per sharonb.

## 2012-08-03 NOTE — Progress Notes (Signed)
Subjective:   Patient ID: [name] female   DOB: 21-Apr-1986 26 y.o.   MRN: [id number]  HPI: [name] is a 26 y.o. woman with PMH of substance abuse ( Amphetamines), ADHD, anxiety, depression and Bipolar disorder who presents to the clinic for urinary discomfort. Patient had ED visit on 07/05/12 when she was given Flagyl and Terconazole cream for bacteria vaginosis and vaginal candidiasis. Notably, her wet prep and GC/Chlamydia were all negative. She has implanted contraceptive ( Nexplanon). No sexual activity for one year but has been sexually active in the past.    Patient reports urinary burning sensation, frequency and urgency for 2 days. Denies fever of chills. Denies back/flank pain. Denies any recent sexual activity.denies h/o STDs. Last LMP 2 weeks ago. See GYN Dr. Ambrose Mantle and had Pap smear in Sep, 2012. Patient reported that it was normal.  With regards to her psychiatry disorders, she has been followed by her Psychiatrist Dr. Kathryne Sharper and last office visit was on 07/07/12.  She was admitted to behavioral health center in June 2013. Her current psychiatric meds include Abilify, Celexa, Lamictal, Trazodone.   With regards to her obesity, I spent 30 minutes discussing with her about weight loss plan.   Past Medical History  Diagnosis Date  . Bipolar disorder   . Attention deficit hyperactivity disorder   . Depression   . Anxiety   . Asthma    Current Outpatient Prescriptions  Medication Sig Dispense Refill  . ARIPiprazole (ABILIFY) 20 MG tablet Take 1 tablet (20 mg total) by mouth daily.  30 tablet  0  . citalopram (CELEXA) 20 MG tablet Take 1 tablet (20 mg total) by mouth at bedtime. For depression.  30 tablet  0  . lamoTRIgine (LAMICTAL) 150 MG tablet Take 1 tablet (150 mg total) by mouth 2 (two) times daily. For mood control.  60 tablet  0  . pantoprazole (PROTONIX) 40 MG tablet Take 1 tablet (40 mg total) by mouth daily. For control of stomach acid  secretion and helps GERD.  31 tablet  0  . trazodone (DESYREL) 300 MG tablet Take 1 tablet (300 mg total) by mouth at bedtime. For insomnia.  30 tablet  0   Family History  Problem Relation Age of Onset  . Depression Mother   . Depression Father   . Depression Brother    History   Social History  . Marital Status: Single    Spouse Name: N/A    Number of Children: N/A  . Years of Education: N/A   Social History Main Topics  . Smoking status: Never Smoker   . Smokeless tobacco: Not on file  . Alcohol Use: No  . Drug Use: No  . Sexually Active: No   Other Topics Concern  . Not on file   Social History Narrative  . No narrative on file   Review of Systems: Review of Systems:  Constitutional:  Denies fever, chills, diaphoresis, appetite change and fatigue.   HEENT:  Denies congestion, sore throat, rhinorrhea, sneezing, mouth sores, trouble swallowing, neck pain   Respiratory:  Denies SOB, DOE, cough, and wheezing.   Cardiovascular:  Denies palpitations and leg swelling.   Gastrointestinal:  Denies nausea, vomiting, abdominal pain, diarrhea, constipation, blood in stool and abdominal distention.   Genitourinary:  positive dysuria, urgency, frequency, denies hematuria, flank pain and difficulty urinating.   Musculoskeletal:  Denies myalgias, back pain, joint swelling, arthralgias and gait problem.   Skin:  Denies pallor, rash and  wound.   Neurological:  Denies dizziness, seizures, syncope, weakness, light-headedness, numbness and headaches.    .   Objective:  Physical Exam: There were no vitals filed for this visit. General: alert, well-developed, and cooperative to examination.  Head: normocephalic and atraumatic.  Eyes: vision grossly intact, pupils equal, pupils round, pupils reactive to light, no injection and anicteric.  Mouth: pharynx pink and moist, no erythema, and no exudates.  Neck: supple, full ROM, no thyromegaly, no JVD, and no carotid bruits.  Lungs: normal  respiratory effort, no accessory muscle use, normal breath sounds, no crackles, and no wheezes. No breast swelling or nodules  noted per physical exam B/L.  Heart: normal rate, regular rhythm, no murmur, no gallop, and no rub.  Abdomen: soft, non-tender, normal bowel sounds, no distention, no guarding, no rebound tenderness, no hepatomegaly, and no splenomegaly.  Msk: no joint swelling, no joint warmth, and no redness over joints.  GU: No CTA tenderness Pulses: 2+ DP/PT pulses bilaterally Extremities: No cyanosis, clubbing, edema Neurologic: alert & oriented X3, cranial nerves II-XII intact, strength normal in all extremities, sensation intact to light touch, and gait normal.  Skin: turgor normal and no rashes.  Psych: Oriented X3, memory intact for recent and remote, normally interactive, good eye contact, not anxious appearing, and not depressed appearing.   Assessment & Plan:

## 2012-08-03 NOTE — Patient Instructions (Addendum)
1. Follow up in 6 months  Calorie Counting Diet A calorie counting diet requires you to eat the number of calories that are right for you in a day. Calories are the measurement of how much energy you get from the food you eat. Eating the right amount of calories is important for staying at a healthy weight. If you eat too many calories, your body will store them as fat and you may gain weight. If you eat too few calories, you may lose weight. Counting the number of calories you eat during a day will help you know if you are eating the right amount. A Registered Dietitian can determine how many calories you need in a day. The amount of calories needed varies from person to person. If your goal is to lose weight, you will need to eat fewer calories. Losing weight can benefit you if you are overweight or have health problems such as heart disease, high blood pressure, or diabetes. If your goal is to gain weight, you will need to eat more calories. Gaining weight may be necessary if you have a certain health problem that causes your body to need more energy. TIPS Whether you are increasing or decreasing the number of calories you eat during a day, it may be hard to get used to changes in what you eat and drink. The following are tips to help you keep track of the number of calories you eat.  Measure foods at home with measuring cups. This helps you know the amount of food and number of calories you are eating.   Restaurants often serve food in amounts that are larger than 1 serving. While eating out, estimate how many servings of a food you are given. For example, a serving of cooked rice is  cup or about the size of half of a fist. Knowing serving sizes will help you be aware of how much food you are eating at restaurants.   Ask for smaller portion sizes or child-size portions at restaurants.   Plan to eat half of a meal at a restaurant. Take the rest home or share the other half with a friend.   Read  the Nutrition Facts panel on food labels for calorie content and serving size. You can find out how many servings are in a package, the size of a serving, and the number of calories each serving has.   For example, a package might contain 3 cookies. The Nutrition Facts panel on that package says that 1 serving is 1 cookie. Below that, it will say there are 3 servings in the container. The calories section of the Nutrition Facts label says there are 90 calories. This means there are 90 calories in 1 cookie (1 serving). If you eat 1 cookie you have eaten 90 calories. If you eat all 3 cookies, you have eaten 270 calories (3 servings x 90 calories = 270 calories).  The list below tells you how big or small some common portion sizes are.  1 oz.........4 stacked dice.   3 oz........Marland KitchenDeck of cards.   1 tsp.......Marland KitchenTip of little finger.   1 tbs......Marland KitchenMarland KitchenThumb.   2 tbs.......Marland KitchenGolf ball.    cup......Marland KitchenHalf of a fist.   1 cup.......Marland KitchenA fist.  KEEP A FOOD LOG Write down every food item you eat, the amount you eat, and the number of calories in each food you eat during the day. At the end of the day, you can add up the total number of calories you have  eaten. It may help to keep a list like the one below. Find out the calorie information by reading the Nutrition Facts panel on food labels. Breakfast  Bran cereal (1 cup, 110 calories).   Fat-free milk ( cup, 45 calories).  Snack  Apple (1 medium, 80 calories).  Lunch  Spinach (1 cup, 20 calories).   Tomato ( medium, 20 calories).   Chicken breast strips (3 oz, 165 calories).   Shredded cheddar cheese ( cup, 110 calories).   Light [place] dressing (2 tbs, 60 calories).   Whole-wheat bread (1 slice, 80 calories).   Tub margarine (1 tsp, 35 calories).   Vegetable soup (1 cup, 160 calories).  Dinner  Pork chop (3 oz, 190 calories).   Brown rice (1 cup, 215 calories).   Steamed broccoli ( cup, 20 calories).   Strawberries (1  cup,  65 calories).   Whipped cream (1 tbs, 50 calories).  Daily Calorie Total: 1425 Document Released: 12/01/2005 Document Revised: 11/20/2011 Document Reviewed: 05/28/2007 [facility] Patient Information 2012 [place], [place].   Exercise to Lose Weight Exercise and a healthy diet may help you lose weight. Your doctor may suggest specific exercises. EXERCISE IDEAS AND TIPS  Choose low-cost things you enjoy doing, such as walking, bicycling, or exercising to workout videos.   Take stairs instead of the elevator.   Walk during your lunch break.   Park your car further away from work or school.   Go to a gym or an exercise class.   Start with 5 to 10 minutes of exercise each day. Build up to 30 minutes of exercise 4 to 6 days a week.   Wear shoes with good support and comfortable clothes.   Stretch before and after working out.   Work out until you breathe harder and your heart beats faster.   Drink extra water when you exercise.   Do not do so much that you hurt yourself, feel dizzy, or get very short of breath.  Exercises that burn about 150 calories:  Running 1  miles in 15 minutes.   Playing volleyball for 45 to 60 minutes.   Washing and waxing a car for 45 to 60 minutes.   Playing touch football for 45 minutes.   Walking 1  miles in 35 minutes.   Pushing a stroller 1  miles in 30 minutes.   Playing basketball for 30 minutes.   Raking leaves for 30 minutes.   Bicycling 5 miles in 30 minutes.   Walking 2 miles in 30 minutes.   Dancing for 30 minutes.   Shoveling snow for 15 minutes.   Swimming laps for 20 minutes.   Walking up stairs for 15 minutes.   Bicycling 4 miles in 15 minutes.   Gardening for 30 to 45 minutes.   Jumping rope for 15 minutes.   Washing windows or floors for 45 to 60 minutes.  Document Released: 01/03/2011 Document Revised: 11/20/2011 Document Reviewed: 01/03/2011 [facility] Patient Information 2012 [place], [place].

## 2012-08-03 NOTE — Assessment & Plan Note (Addendum)
Patient is calm and cooperative during the interview today at clinic.  No anger or vegetation noted.  She just had office visit with her psychiatrist and on 07/07/12.  - Will instruct patient to continue followup with her psychiatrist -Continue the current regimen

## 2012-08-03 NOTE — Assessment & Plan Note (Signed)
The clinical manifestation is consistent with UTI. Will collect UA and culture  - will empirically treated with Cipro 250 mg po BID x 3 days.

## 2012-08-04 ENCOUNTER — Ambulatory Visit (INDEPENDENT_AMBULATORY_CARE_PROVIDER_SITE_OTHER): Payer: Medicare Other | Admitting: Psychiatry

## 2012-08-04 ENCOUNTER — Encounter (HOSPITAL_COMMUNITY): Payer: Self-pay | Admitting: Psychiatry

## 2012-08-04 VITALS — Wt 224.0 lb

## 2012-08-04 DIAGNOSIS — F988 Other specified behavioral and emotional disorders with onset usually occurring in childhood and adolescence: Secondary | ICD-10-CM

## 2012-08-04 DIAGNOSIS — F319 Bipolar disorder, unspecified: Secondary | ICD-10-CM

## 2012-08-04 LAB — URINALYSIS, ROUTINE W REFLEX MICROSCOPIC
Bilirubin Urine: NEGATIVE
Glucose, UA: NEGATIVE mg/dL
Hgb urine dipstick: NEGATIVE
Ketones, ur: NEGATIVE mg/dL
Leukocytes, UA: NEGATIVE
Nitrite: NEGATIVE
Protein, ur: 30 mg/dL — AB
Specific Gravity, Urine: 1.037 — ABNORMAL HIGH (ref 1.005–1.030)
Urobilinogen, UA: 0.2 mg/dL (ref 0.0–1.0)
pH: 5.5 (ref 5.0–8.0)

## 2012-08-04 LAB — URINALYSIS, MICROSCOPIC ONLY
Bacteria, UA: NONE SEEN
Casts: NONE SEEN
Crystals: NONE SEEN

## 2012-08-04 MED ORDER — CITALOPRAM HYDROBROMIDE 20 MG PO TABS: 20.0000 mg | ORAL_TABLET | Freq: Every day | ORAL | Status: DC

## 2012-08-04 MED ORDER — TRAZODONE HCL 300 MG PO TABS: 300.0000 mg | ORAL_TABLET | Freq: Every day | ORAL | Status: DC

## 2012-08-04 MED ORDER — LAMOTRIGINE 150 MG PO TABS: 150.0000 mg | ORAL_TABLET | Freq: Two times a day (BID) | ORAL | Status: DC

## 2012-08-04 MED ORDER — ARIPIPRAZOLE 20 MG PO TABS: 20.0000 mg | ORAL_TABLET | Freq: Every day | ORAL | Status: DC

## 2012-08-04 NOTE — Progress Notes (Signed)
Chief complaint I am doing better .  However I continued to have UTI infection.          History of present illness. Patient is 26 year old Caucasian female who came for followup appointment with her mother.  On her last visit we have increased Abilify to 20 mg.  She's doing better .  She is less irritable and impulsive.  She sleeping better.  Her mother endorse improvement in her behavior.  She has eye surgery and recently UTI.  She is taking medication for UTI.  Her symptoms are slowly getting better.  She likes her current psychiatric medication.  She continues to have issue in her relationship with her boyfriend .  She was scheduled to see therapist however due to recent eye surgery and UTI symptoms she was unable to see therapist.  She's not drinking or using any illegal substance.  She denies any recent hallucination or any paranoid thinking.  She denies any recent episode of self abusive behavior.  Current psychiatric medication Celexa 20 mg daily  Lamictal 150 mg twice a day Abilify 10 mg daily  Trazodone 300 mg at bedtime.    Past psychiatric history Patient has a long history of impulsive behavior with multiple inpatient psychiatric treatment. She has history of suicidal attempt, cutting herself and aggressive behavior. She had history of treatment with Prozac Depakote Klonopin Abilify Concerta and Strattera.  She was recently [facility] due to overdose on aspirin and superficially cut her wrist.  In past year she has been admitted 3 times due to relationship issue and suicidal thinking.  Psychosocial history Patient lives with her mother. She has no children. She was involved in very intense relationship which ended yesterday.  Medical history Obesity, GERD patient recently had yeast infection and taking antibiotic.  She also has recent eye surgery.  Mental status examination Patient is mildly obese female who is casually dressed and fairly groomed.  She is   superficially cooperative and easily distracted and her conversation.  She appears somewhat calm and relaxed.  Her attention and concentration is distracted.  She has a short attention span.  Her speech is fast.  Her thought processes distracted.  She denies any active or passive suicidal thinking and homicidal thinking.  She denies any auditory or visual hallucination.  There were no psychotic symptoms present at this time.  There were no tremors or shakes.  She's alert and oriented x3.  Her insight judgment and impulse control is fair.  Assessment Axis I bipolar disorder, ADD Axis II borderline intellect Axis III see medical history Axis IV moderate Axis V 50-55  Plan I will continue her current psychiatric medication.  Since Abilify dose has increased to 20 mg she is showing much improvement.  I review her chart , progress note and recent blood test.  I recommend to call us if she is a question or concern about the medication or if she feels worsening of the symptoms.  In the past she has taken Abilify 40 mg however we will defer increasing the Abilify at this time.  Time spent 30 minutes I will see her again in 2 months.  Portion of this note is generated with voice dictation software and may contain typographical error.

## 2012-08-05 LAB — URINE CULTURE
Colony Count: NO GROWTH
Organism ID, Bacteria: NO GROWTH

## 2012-08-07 ENCOUNTER — Emergency Department (INDEPENDENT_AMBULATORY_CARE_PROVIDER_SITE_OTHER)
Admission: EM | Admit: 2012-08-07 | Discharge: 2012-08-07 | Disposition: A | Discharge status: Home / Self Care | Payer: Medicare Other | Source: Home / Self Care | Attending: Emergency Medicine | Admitting: Emergency Medicine

## 2012-08-07 ENCOUNTER — Encounter (HOSPITAL_COMMUNITY): Payer: Self-pay | Admitting: *Deleted

## 2012-08-07 DIAGNOSIS — R079 Chest pain, unspecified: Secondary | ICD-10-CM

## 2012-08-07 DIAGNOSIS — R1110 Vomiting, unspecified: Secondary | ICD-10-CM

## 2012-08-07 MED ORDER — GI COCKTAIL ~~LOC~~
30.0000 mL | Freq: Once | ORAL | Status: AC
  Administered 2012-08-07: 30 mL via ORAL

## 2012-08-07 MED ORDER — GI COCKTAIL ~~LOC~~
ORAL | Status: AC
  Filled 2012-08-07: qty 30

## 2012-08-07 MED ORDER — PANTOPRAZOLE SODIUM 40 MG PO TBEC: 40.0000 mg | DELAYED_RELEASE_TABLET | Freq: Every day | ORAL | Status: DC

## 2012-08-07 NOTE — ED Notes (Signed)
EKG performed @ 19:15:20 24/08/13 showing NSR with suggestion of septal infarct

## 2012-08-07 NOTE — ED Notes (Signed)
States pain is much better after taking GI cocktail.

## 2012-08-07 NOTE — ED Provider Notes (Signed)
Medical screening examination/treatment/procedure(s) were performed by non-physician practitioner and as supervising physician I was immediately available for consultation/collaboration.  Leslee Home, M.D.   Reuben Likes, MD 08/07/12 [phone]

## 2012-08-07 NOTE — ED Provider Notes (Signed)
History     CSN: [id number]  Arrival date & time 08/07/12  1707   None     Chief Complaint  Patient presents with  . Chest Pain    (Consider location/radiation/quality/duration/timing/severity/associated sxs/prior treatment) The history is provided by the patient.  DORATHA MCSWAIN is a 26 y.o. female who complains of substernal chest pain that began 1 day ago. Episode Length: intermittent sharp, has not changed in nature. Has no pain at current.  The quality of the pain is described as sharp. The pain does not radiate. Exacerbated by: nothing. Primary symptoms: chest pain.  Associated symptoms: None.  She has tried nothing for the symptoms. Risk factors include no known risk factors. Past medical history comments: gastric reflux.  Family history comments: noncontributory Procedure history comments: none contributory.       Past Medical History  Diagnosis Date  . Bipolar disorder   . Attention deficit hyperactivity disorder   . Depression   . Anxiety   . Asthma   . UTI (urinary tract infection)     Completed Cipro 08/06/12    Past Surgical History  Procedure Date  . Mouth surgery   . Eye muscle surgery     07/29/12    Family History  Problem Relation Age of Onset  . Depression Mother   . Depression Father   . Depression Brother     History  Substance Use Topics  . Smoking status: Never Smoker   . Smokeless tobacco: Not on file  . Alcohol Use: No    OB History    Grav Para Term Preterm Abortions TAB SAB Ect Mult Living                  Review of Systems  Constitutional: Negative.   Respiratory: Negative.   Cardiovascular: Negative.   Skin: Negative.   Neurological: Negative.     Allergies  Divalproex sodium and Methylphenidate derivatives  Home Medications   Current Outpatient Rx  Name Route Sig Dispense Refill  . ALBUTEROL IN Inhalation Inhale into the lungs as needed.    . ARIPIPRAZOLE 20 MG PO TABS Oral Take 1 tablet (20 mg total) by mouth  daily. 30 tablet 1  . CITALOPRAM HYDROBROMIDE 20 MG PO TABS Oral Take 1 tablet (20 mg total) by mouth at bedtime. For depression. 30 tablet 1  . LAMOTRIGINE 150 MG PO TABS Oral Take 1 tablet (150 mg total) by mouth 2 (two) times daily. For mood control. 60 tablet 1  . TRAZODONE HCL 300 MG PO TABS Oral Take 1 tablet (300 mg total) by mouth at bedtime. For insomnia. 30 tablet 1  . CIPROFLOXACIN HCL 250 MG PO TABS Oral Take 250 mg by mouth 2 (two) times daily. Took last dose 8/23    . OXYCODONE-ACETAMINOPHEN 5-325 MG PO TABS      . PANTOPRAZOLE SODIUM 40 MG PO TBEC Oral Take 1 tablet (40 mg total) by mouth daily. For control of stomach acid secretion and helps GERD. 30 tablet 2    BP 109/69  Pulse 59  Temp 98.5 F (36.9 C) (Oral)  Resp 17  SpO2 99%  LMP 07/21/2012  Physical Exam  Nursing note and vitals reviewed. Constitutional: She is oriented to person, place, and time. Vital signs are normal. She appears well-developed and well-nourished. She is active and cooperative.  HENT:  Head: Normocephalic.  Eyes: Conjunctivae are normal. Pupils are equal, round, and reactive to light. No scleral icterus.  Neck: Trachea normal. Neck  supple.  Cardiovascular: Normal rate, regular rhythm, normal heart sounds and intact distal pulses.   No murmur heard. Pulmonary/Chest: Effort normal and breath sounds normal. No respiratory distress. She exhibits no tenderness.  Abdominal: Soft. Bowel sounds are normal. There is no tenderness.  Lymphadenopathy:    She has no cervical adenopathy.  Neurological: She is alert and oriented to person, place, and time. No cranial nerve deficit or sensory deficit.  Skin: Skin is warm and dry.  Psychiatric: She has a normal mood and affect. Her speech is normal and behavior is normal. Judgment and thought content normal. Cognition and memory are normal.    ED Course  Procedures (including critical care time)  Labs Reviewed - No data to display No results  found.   1. Chest pain   2. Vomiting       MDM  Gi cocktail in office.  Begin Protonix as previously prescribed.  Follow up with primary care provider this week.          Johnsie Kindred, NP 08/07/12 1944

## 2012-08-07 NOTE — ED Notes (Addendum)
C/O sharp pains from upper chest at base of throat extending down occasionally into epigastric region.  Denies n/v, cold sxs, cough, or fevers.  Pain is worse with deep breathing, palpation, and movement.  Was taking pantoprazole - ran out approx 1 month ago (had taken x 1 mo).

## 2012-08-07 NOTE — Discharge Instructions (Signed)
Begin your protonix as previously prescribed.  Follow up with your primary care provider in one week.  Return if symptoms are not improved or worsen.

## 2012-08-09 ENCOUNTER — Ambulatory Visit (INDEPENDENT_AMBULATORY_CARE_PROVIDER_SITE_OTHER): Payer: Medicare Other | Admitting: Internal Medicine

## 2012-08-09 ENCOUNTER — Encounter: Payer: Self-pay | Admitting: Internal Medicine

## 2012-08-09 ENCOUNTER — Telehealth: Payer: Self-pay | Admitting: *Deleted

## 2012-08-09 VITALS — BP 111/76 | HR 61 | Temp 97.5°F | Ht 66.0 in | Wt 224.3 lb

## 2012-08-09 DIAGNOSIS — R0789 Other chest pain: Secondary | ICD-10-CM

## 2012-08-09 DIAGNOSIS — R071 Chest pain on breathing: Secondary | ICD-10-CM

## 2012-08-09 MED ORDER — IBUPROFEN 800 MG PO TABS: 800.0000 mg | ORAL_TABLET | Freq: Three times a day (TID) | ORAL | Status: AC | PRN

## 2012-08-09 NOTE — Telephone Encounter (Signed)
Pt's mother informed and we will see at 2:15.  If pt gets worse, dizziness, vomiting etc please go to ED at once.

## 2012-08-09 NOTE — Patient Instructions (Signed)
Take Ibuprofen 800 mg every 8 hours as needed for the costochondritis chest pain. Follow-up with your doctor if needed.

## 2012-08-09 NOTE — Telephone Encounter (Signed)
Agree with plan to have her seen in clinic for this chest pain given age, chronicity, etc.

## 2012-08-09 NOTE — Assessment & Plan Note (Signed)
Very tender to mild palpation over sternum.   Will give trial of ibuprofen given EKG without acute findings in urgent care.  Also advised pt to fill Protonix prescription and take with ibuprofen for several days.

## 2012-08-09 NOTE — Telephone Encounter (Signed)
Pt's mother call because pt is  c/o chest pain. Rates pain 7/10, a little SOB and mild sweating., no nausea. Pain is on left side of chest without radiation.  Pain is on and off.  Present with both rest and activity.  This is not new, she had this pain in past. Seen in [facility] on 8/24 for chest pain, started on protonix and was to f/u with [place] this week. EKG done at [facility].  Pt # [id number]

## 2012-08-09 NOTE — Progress Notes (Signed)
  Subjective:    Patient ID: [name], female    DOB: 05-Aug-1986, 26 y.o.   MRN: [id number]  HPI  26-year-old with history significant for bipolar disorder and GERD presents for post urgent care followup of chest pain. EKG at that time without acute findings for ischemic etiology. She was prescribed Protonix of which he has not filled the prescription but states the patient took a couple of her mother's Protonix which did not help. Patient describes chest pain as sharp nonradiating in the center of her chest. This is easily reproducible on palpation. Eyes cough, shortness of breath, nausea vomiting, sick contacts or exerting herself including no heavy lifting.  Review of Systems  Constitutional: Negative for fever and fatigue.  HENT: Negative for congestion.   Eyes: Negative for visual disturbance.  Respiratory: Negative for cough, chest tightness, shortness of breath and wheezing.   Cardiovascular: Positive for chest pain. Negative for leg swelling.  Musculoskeletal: Negative for back pain.  Neurological: Negative for weakness, light-headedness, numbness and headaches.  Psychiatric/Behavioral: Negative for dysphoric mood and agitation.       Objective:   Physical Exam  Constitutional: She is oriented to person, place, and time. She appears well-developed and well-nourished. No distress.  HENT:  Head: Normocephalic and atraumatic.  Eyes: Conjunctivae and EOM are normal. Pupils are equal, round, and reactive to light.  Neck: Normal range of motion. Neck supple.  Cardiovascular: Normal rate, regular rhythm, normal heart sounds and intact distal pulses.   No murmur heard. Pulmonary/Chest: Effort normal and breath sounds normal. No respiratory distress. She has no wheezes. She exhibits tenderness.  Abdominal: Soft. Bowel sounds are normal.  Neurological: She is alert and oriented to person, place, and time.  Skin: Skin is warm and dry.  Psychiatric: She has a normal mood and  affect. Her behavior is normal.          Assessment & Plan:  1. costochondritis: Patient given trial of ibuprofen 800 mg Q8 hours to be taken with Protonix

## 2012-08-10 ENCOUNTER — Encounter (HOSPITAL_COMMUNITY): Payer: Self-pay | Admitting: Psychology

## 2012-08-10 ENCOUNTER — Ambulatory Visit (INDEPENDENT_AMBULATORY_CARE_PROVIDER_SITE_OTHER): Payer: Medicare Other | Admitting: Psychology

## 2012-08-10 DIAGNOSIS — F319 Bipolar disorder, unspecified: Secondary | ICD-10-CM

## 2012-08-10 NOTE — Progress Notes (Signed)
Patient:   [name]   DOB:   1986-05-03  MR Number:  [id number]  Location:  [facility] [address] [place] [address] Dept: [phone]           Date of Service:   08/10/12  Start Time:   10.36am End Time:   11.30am  Provider/Observer:  [facility]       Billing Code/Service: [phone]  Chief Complaint:     Chief Complaint  Patient presents with  . Establish Care    bipolar 1 disorder    Reason for Service:  Dr. Lolly Mustache referred for counseling.  Pt reports she is working w/ a Veterinary surgeon but not feeling good relationship w/ current Veterinary surgeon.  Pt reports she is seeking counseling for coping w/ depression, anger and relationship w/ boyfriend, Thayer Ohm.  Current Status:  Pt states boyrfriend is her main problem as he is controlling and reports feeling hurt by him emotionally.  Pt stated awareness that boyfriend is not healthy for her, but loves him.  Pt reports she is not able to set boundaries in this relationship. Pt reports she will yell and curse at others when angry.  Hasn't been destructive during anger episodes in 3-4 months during.  Pt reported that she hasn't cut in 2+ months and no SI in 2+ months.   Reliability of Information: Pt provided information and pt chart was reviewed by counselor for information.  Mom did not present in the lobby today.  Behavioral Observation: [name]  presents as a 26 y.o.-year-old Caucasian Female who appeared her stated age. her dress was Appropriate and she was Casual and Fairly Groomed and her manners were Appropriate to the situation.  There were not any physical disabilities noted.  she displayed an appropriate level of cooperation and motivation.    Interactions:    Active   Attention:   distracted  Memory:   normal  Visuo-spatial:   not examined  Speech (Volume):  normal  Speech:   normal pitch and normal volume  Thought  Process:  Coherent and Relevant  Though Content:  WNL  Orientation:   person, place, time/date and situation  Judgment:   Fair  Planning:   Fair  Affect:    Appropriate  Mood:    Euthymic  Insight:   Fair  Intelligence:   low  Marital Status/Living: Pt lives w/ her mother and brother 34y/o. Pt is close w/ brother, Gerlene Burdock.  Pt is close w/ mother.  Pt reports sees dad only occasionally- parents separated when she was very young.  Sister through father doesn't see.   Social Hx:   Pt writes poetry- Regulatory affairs officer, enjoys spending time online- facebook and youtube and [place], Swimming,  Reading.  Pt boyfriend (19y/o) dating since April 08, 2011.  Pt reports Mom doesn't like him.  They have Broken up 3 times for couple weeks at a time.  [place]- met through text and they See each other every couple of months. She reports they Talk on phone throughout the day.  Pt reports He's controlling and she identifies as main problem in her life is the relationship.     Pt reports no friendships.  [employer] that pt is involved with.    Current Employment: Pt is on disability.    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   Substance Use:  No concerns of substance abuse are reported.    Education:   HS Graduate of [employer] w/ a certificate in 2007.    Medical History:   Past Medical History  Diagnosis Date  . Bipolar disorder   . Attention deficit hyperactivity disorder   . Depression   . Anxiety   . Asthma   . UTI (urinary tract infection)     Completed Cipro 08/06/12        Outpatient Encounter Prescriptions as of 08/10/2012  Medication Sig Dispense Refill  . ALBUTEROL IN Inhale into the lungs as needed.      . ARIPiprazole (ABILIFY) 20 MG tablet Take 1 tablet (20 mg total) by mouth daily.  30 tablet  1  . ciprofloxacin (CIPRO) 250 MG tablet Take 250 mg  by mouth 2 (two) times daily. Took last dose 8/23      . citalopram (CELEXA) 20 MG tablet Take 1 tablet (20 mg total) by mouth at bedtime. For depression.  30 tablet  1  . ibuprofen (ADVIL,MOTRIN) 800 MG tablet Take 1 tablet (800 mg total) by mouth every 8 (eight) hours as needed for pain.  30 tablet  0  . lamoTRIgine (LAMICTAL) 150 MG tablet Take 1 tablet (150 mg total) by mouth 2 (two) times daily. For mood control.  60 tablet  1  . oxyCODONE-acetaminophen (PERCOCET/ROXICET) 5-325 MG per tablet       . pantoprazole (PROTONIX) 40 MG tablet Take 1 tablet (40 mg total) by mouth daily. For control of stomach acid secretion and helps GERD.  30 tablet  2  . trazodone (DESYREL) 300 MG tablet Take 1 tablet (300 mg total) by mouth at bedtime. For insomnia.  30 tablet  1        Pt reports medication compliance- mom helps monitor and give meds.  Sexual History:   History  Sexual Activity  . Sexually Active: Not Currently    Abuse/Trauma History: Pt denied any trauma.  Pt reported loss of maternal gm who died when she was [age]yrs old was difficult as she lived w/ her growing up.    Psychiatric History:  Pt reports she has been working w/ [employer] at [employer] for 13 years.  Pt reported she is scheduled for session w/ her today.  She reports feeling that she isn't making any further progress and more just a friendship and wants to change providers.  Pt has been working w/ Dr. Lolly Mustache for several years- addressing her Bipolar D/O.  Family Med/Psych History:  Family History  Problem Relation Age of Onset  . Depression Mother   . Depression Father   . Depression Brother     Risk of Suicide/Violence: low Pt reports no current SI or recent- last SI was last inpt tx- June 2013 for OD on aspirin.  Pt also reports hx of cutting in past but hasn't since about June 2013.  Pt has had 2 inpt tx in last 9months.  Impression/DX:  Pt is a 26y/o female referred by Dr. Lolly Mustache for tx of  Bipolar I D/O.  Pt has hx of multiple inpt tx for cutting or suicidal gestures- w/ 2 in past 9 months.  Pt reports currently depression improved and no SI- self harm in past 2 months.  Pt identifies main stressor as her relationship w/ boyfriend who she identifies as controlling.  Pt is in tx currently w/ another therapist, but expresses want to change providers and  so agreed to inform counselor of ending relationship.  Pt appears to be limited in cognitive functioning, is supported by mom to follow tx recommendations and limited in her insight.   Disposition/Plan:  Pt states she wants to seek counseling with this provider and terminate care with [facility].  Pt agrees to inform [employer] and will schedule an appointment for f/u in 2 weeks once completed.  Pt signed a consent to release information w/ this provider.  Diagnosis:    Axis I:   1. Bipolar I disorder, most recent episode (or current) unspecified         Axis II: Deferred       Axis III:  Asthma, Eye surgery      Axis IV:  problems related to social environment          Axis V:  51-60 moderate symptoms

## 2012-08-22 ENCOUNTER — Emergency Department (HOSPITAL_COMMUNITY)
Admission: EM | Admit: 2012-08-22 | Discharge: 2012-08-24 | Disposition: A | Discharge status: Home / Self Care | Payer: Medicare Other | Attending: Emergency Medicine | Admitting: Emergency Medicine

## 2012-08-22 ENCOUNTER — Encounter (HOSPITAL_COMMUNITY): Payer: Self-pay | Admitting: Emergency Medicine

## 2012-08-22 DIAGNOSIS — R079 Chest pain, unspecified: Secondary | ICD-10-CM | POA: Insufficient documentation

## 2012-08-22 LAB — URINALYSIS, ROUTINE W REFLEX MICROSCOPIC
Bilirubin Urine: NEGATIVE
Glucose, UA: NEGATIVE mg/dL
Ketones, ur: NEGATIVE mg/dL
Leukocytes, UA: NEGATIVE
Nitrite: NEGATIVE
Protein, ur: NEGATIVE mg/dL
Specific Gravity, Urine: 1.031 — ABNORMAL HIGH (ref 1.005–1.030)
Urobilinogen, UA: 1 mg/dL (ref 0.0–1.0)
pH: 5.5 (ref 5.0–8.0)

## 2012-08-22 LAB — URINE MICROSCOPIC-ADD ON

## 2012-08-22 LAB — POCT PREGNANCY, URINE: Preg Test, Ur: NEGATIVE

## 2012-08-22 NOTE — ED Notes (Signed)
Chest pain, stabbing feeling, hurts worse when taking  Big breath, just started a few minutes ago. No resp distress, also c/o vaginal itching -- no discharge. Has been on cell phone ion triage room, in no apparent distress

## 2012-09-01 ENCOUNTER — Ambulatory Visit (HOSPITAL_COMMUNITY): Payer: Self-pay | Admitting: Physician Assistant

## 2012-09-01 ENCOUNTER — Emergency Department (HOSPITAL_COMMUNITY): Payer: Medicare Other

## 2012-09-01 ENCOUNTER — Emergency Department (HOSPITAL_COMMUNITY)
Admission: EM | Admit: 2012-09-01 | Discharge: 2012-09-01 | Disposition: A | Discharge status: Home / Self Care | Payer: Medicare Other | Attending: Emergency Medicine | Admitting: Emergency Medicine

## 2012-09-01 ENCOUNTER — Encounter (HOSPITAL_COMMUNITY): Payer: Self-pay | Admitting: Emergency Medicine

## 2012-09-01 DIAGNOSIS — R300 Dysuria: Secondary | ICD-10-CM | POA: Insufficient documentation

## 2012-09-01 DIAGNOSIS — J45909 Unspecified asthma, uncomplicated: Secondary | ICD-10-CM | POA: Insufficient documentation

## 2012-09-01 DIAGNOSIS — R0789 Other chest pain: Secondary | ICD-10-CM

## 2012-09-01 DIAGNOSIS — R071 Chest pain on breathing: Secondary | ICD-10-CM | POA: Insufficient documentation

## 2012-09-01 DIAGNOSIS — Z818 Family history of other mental and behavioral disorders: Secondary | ICD-10-CM | POA: Insufficient documentation

## 2012-09-01 DIAGNOSIS — F909 Attention-deficit hyperactivity disorder, unspecified type: Secondary | ICD-10-CM | POA: Insufficient documentation

## 2012-09-01 DIAGNOSIS — Z888 Allergy status to other drugs, medicaments and biological substances status: Secondary | ICD-10-CM | POA: Insufficient documentation

## 2012-09-01 DIAGNOSIS — F319 Bipolar disorder, unspecified: Secondary | ICD-10-CM | POA: Insufficient documentation

## 2012-09-01 LAB — WET PREP, GENITAL
Trich, Wet Prep: NONE SEEN
Yeast Wet Prep HPF POC: NONE SEEN

## 2012-09-01 LAB — URINALYSIS, ROUTINE W REFLEX MICROSCOPIC
Bilirubin Urine: NEGATIVE
Glucose, UA: NEGATIVE mg/dL
Ketones, ur: NEGATIVE mg/dL
Leukocytes, UA: NEGATIVE
Nitrite: NEGATIVE
Protein, ur: NEGATIVE mg/dL
Specific Gravity, Urine: 1.023 (ref 1.005–1.030)
Urobilinogen, UA: 1 mg/dL (ref 0.0–1.0)
pH: 7 (ref 5.0–8.0)

## 2012-09-01 LAB — URINE MICROSCOPIC-ADD ON

## 2012-09-01 LAB — POCT PREGNANCY, URINE: Preg Test, Ur: NEGATIVE

## 2012-09-01 MED ORDER — KETOROLAC TROMETHAMINE 30 MG/ML IJ SOLN
30.0000 mg | Freq: Once | INTRAMUSCULAR | Status: DC
  Filled 2012-09-01: qty 1

## 2012-09-01 NOTE — ED Notes (Signed)
Lab ordered by Preston Fleeting EDP discontinued d/t pt's refusal

## 2012-09-01 NOTE — ED Notes (Signed)
Pt refusing blood draw, IV start, Pain medicine IV and IM. MD notified.

## 2012-09-01 NOTE — ED Notes (Signed)
GEX:BM84<XL> Expected date:09/01/12<BR> Expected time: 8:21 AM<BR> Means of arrival:Ambulance<BR> Comments:<BR> 26yoF, epigastric/back pain

## 2012-09-01 NOTE — ED Notes (Addendum)
Per EMS-pt c/o of chest pain, tender upon touch, also abdominal pain, abd tender, diarrhea no vomiting pt also c/o of vaginal discharge.

## 2012-09-01 NOTE — ED Notes (Signed)
Pt. Is unable to use the restroom at this time, but is aware that we need urine.  

## 2012-09-01 NOTE — ED Provider Notes (Signed)
History     CSN: [id number]  Arrival date & time 09/01/12  4098   First MD Initiated Contact with Patient 09/01/12 0840      Chief Complaint  Patient presents with  . Chest Pain  . Abdominal Pain  . Vaginal Discharge    (Consider location/radiation/quality/duration/timing/severity/associated sxs/prior treatment) Patient is a 26 y.o. female presenting with chest pain, abdominal pain, and vaginal discharge. The history is provided by the patient.  Chest Pain Primary symptoms include abdominal pain.    Abdominal Pain The primary symptoms of the illness include abdominal pain and vaginal discharge.  Vaginal Discharge Associated symptoms include chest pain and abdominal pain.   she had onset at 4:30 AM of a sharp left-sided chest pain which radiated to her back and into her abdomen. Pain is worse with deep breathing and palpation, but not worse with movement. She denies dyspnea, nausea, vomiting, diaphoresis. She's not had any cough. She's had some chills, but no fever. This is similar to pain she has been having over the last month, but worse than prior pains. She rates the pain at 7/10. She's not taken anything to treat the pain. She's also complaining about dysuria and a white vaginal discharge. There is mild suprapubic pain associated with this. She is using birth control pills for contraception.  Past Medical History  Diagnosis Date  . Bipolar disorder   . Attention deficit hyperactivity disorder   . Depression   . Anxiety   . Asthma   . UTI (urinary tract infection)     Completed Cipro 08/06/12    Past Surgical History  Procedure Date  . Mouth surgery   . Eye muscle surgery     07/29/12    Family History  Problem Relation Age of Onset  . Depression Mother   . Depression Father   . Depression Brother     History  Substance Use Topics  . Smoking status: Never Smoker   . Smokeless tobacco: Never Used  . Alcohol Use: No    OB History    Grav Para Term Preterm  Abortions TAB SAB Ect Mult Living                  Review of Systems  Cardiovascular: Positive for chest pain.  Gastrointestinal: Positive for abdominal pain.  Genitourinary: Positive for vaginal discharge.  All other systems reviewed and are negative.    Allergies  Divalproex sodium and Methylphenidate derivatives  Home Medications   Current Outpatient Rx  Name Route Sig Dispense Refill  . ARIPIPRAZOLE 20 MG PO TABS Oral Take 20 mg by mouth daily.    [name] CITALOPRAM HYDROBROMIDE 20 MG PO TABS Oral Take 20 mg by mouth at bedtime. For depression.    [name] LAMOTRIGINE 150 MG PO TABS Oral Take 150 mg by mouth 2 (two) times daily. For mood control.    . TRAZODONE HCL 300 MG PO TABS Oral Take 300 mg by mouth at bedtime. For insomnia.      BP 111/64  Pulse 76  Temp 98.4 F (36.9 C) (Oral)  Resp 19  SpO2 98%  LMP 07/21/2012  Physical Exam  Nursing note and vitals reviewed. 26 year old female, resting comfortably and in no acute distress. Vital signs are normal. Oxygen saturation is 98%, which is normal. Head is normocephalic and atraumatic. PERRLA, EOMI. Oropharynx is clear. Neck is nontender and supple without adenopathy or JVD. Back is nontender and there is no CVA tenderness. Lungs are clear without rales,  wheezes, or rhonchi. Chest is has moderate tenderness of the anterior chest wall which is worse on the left than on the right. Heart has regular rate and rhythm without murmur. Abdomen is soft, flat, with mild suprapubic tenderness. There are no masses or hepatosplenomegaly and peristalsis is normoactive. Pelvic: Normal external female genitalia, small amount of menstrual flow present, fundus normal size and position, no cervical motion tenderness, no adnexal masses or tenderness. Extremities have no cyanosis or edema, full range of motion is present. Skin is warm and dry without rash. Neurologic: Mental status is significant for flat affect, cranial nerves are intact, there are  no motor or sensory deficits.   ED Course  Procedures (including critical care time)  Results for orders placed during the hospital encounter of 09/01/12  URINALYSIS, ROUTINE W REFLEX MICROSCOPIC      Component Value Range   Color, Urine YELLOW  YELLOW   APPearance CLOUDY (*) CLEAR   Specific Gravity, Urine 1.023  1.005 - 1.030   pH 7.0  5.0 - 8.0   Glucose, UA NEGATIVE  NEGATIVE mg/dL   Hgb urine dipstick LARGE (*) NEGATIVE   Bilirubin Urine NEGATIVE  NEGATIVE   Ketones, ur NEGATIVE  NEGATIVE mg/dL   Protein, ur NEGATIVE  NEGATIVE mg/dL   Urobilinogen, UA 1.0  0.0 - 1.0 mg/dL   Nitrite NEGATIVE  NEGATIVE   Leukocytes, UA NEGATIVE  NEGATIVE  WET PREP, GENITAL      Component Value Range   Yeast Wet Prep HPF POC NONE SEEN  NONE SEEN   Trich, Wet Prep NONE SEEN  NONE SEEN   Clue Cells Wet Prep HPF POC FEW (*) NONE SEEN   WBC, Wet Prep HPF POC RARE (*) NONE SEEN  POCT PREGNANCY, URINE      Component Value Range   Preg Test, Ur NEGATIVE  NEGATIVE  URINE MICROSCOPIC-ADD ON      Component Value Range   Squamous Epithelial / LPF FEW (*) RARE   RBC / HPF 11-20  <3 RBC/hpf   Bacteria, UA FEW (*) RARE   Urine-Other MUCOUS PRESENT     Dg Chest 2 View  09/01/2012  *RADIOLOGY REPORT*  Clinical Data: Chest pain.  History of asthma.  CHEST - 2 VIEW  Comparison: 12/12/2011.  Findings: The heart is upper limits of normal and stable.  The mediastinal and hilar contours are unchanged.  There are mild bronchitic type lung changes which could be due to reactive airways disease.  No focal infiltrates, pleural effusion or pneumothorax. The bony thorax is intact.  IMPRESSION: Mild bronchitic type lung changes likely secondary to reactive airways disease.  No focal infiltrates.   Original Report Authenticated By: P. Loralie Champagne, M.D.      ECG shows normal sinus rhythm with a rate of 78, no ectopy. Normal axis. Normal P wave. Normal QRS. Normal intervals. Normal ST and T waves. Impression: normal  ECG. Compared with ECG of 08/22/2012, no significant changes are seen.   1. Costochondral chest pain   2. Dysuria       MDM  Chest pain which seems to be musculoskeletal. Chest x-ray will be obtained as well as EKG and she will be given in. Treatment with ketorolac. Urinary symptoms and vaginal discharge will need to be evaluated with urinalysis and pelvic exam with wet prep. Patient was advised of workup that has been ordered and she states she does not want any IV more blood drawn. Prior records are reviewed and she had been  seen in urgent care and in her physician's office for costochondral chest pain one month ago.   Patient refused blood tests and medication. Workup has come back negative, the patient apparently left the ED without telling anybody prior to the results coming back.     Dione Booze, MD 09/02/12 [phone]

## 2012-09-01 NOTE — ED Notes (Signed)
MD at bedside. 

## 2012-09-02 LAB — GC/CHLAMYDIA PROBE AMP, GENITAL
Chlamydia, DNA Probe: NEGATIVE
GC Probe Amp, Genital: NEGATIVE

## 2012-09-15 DIAGNOSIS — F79 Unspecified intellectual disabilities: Secondary | ICD-10-CM | POA: Insufficient documentation

## 2012-09-19 ENCOUNTER — Encounter (HOSPITAL_COMMUNITY): Payer: Self-pay

## 2012-09-19 ENCOUNTER — Emergency Department (INDEPENDENT_AMBULATORY_CARE_PROVIDER_SITE_OTHER)
Admission: EM | Admit: 2012-09-19 | Discharge: 2012-09-19 | Disposition: A | Discharge status: Home / Self Care | Payer: Medicare Other | Source: Home / Self Care | Attending: Emergency Medicine | Admitting: Emergency Medicine

## 2012-09-19 DIAGNOSIS — J029 Acute pharyngitis, unspecified: Secondary | ICD-10-CM

## 2012-09-19 LAB — POCT RAPID STREP A: Streptococcus, Group A Screen (Direct): NEGATIVE

## 2012-09-19 MED ORDER — BENZONATATE 200 MG PO CAPS: 200.0000 mg | ORAL_CAPSULE | Freq: Three times a day (TID) | ORAL | Status: DC | PRN

## 2012-09-19 MED ORDER — ONDANSETRON 8 MG PO TBDP: 8.0000 mg | ORAL_TABLET | Freq: Three times a day (TID) | ORAL | Status: DC | PRN

## 2012-09-19 MED ORDER — NAPROXEN 500 MG PO TABS: 500.0000 mg | ORAL_TABLET | Freq: Two times a day (BID) | ORAL | Status: DC

## 2012-09-19 MED ORDER — GUAIFENESIN-CODEINE 100-10 MG/5ML PO SYRP: 5.0000 mL | ORAL_SOLUTION | Freq: Three times a day (TID) | ORAL | Status: DC | PRN

## 2012-09-19 NOTE — ED Notes (Signed)
Sore throat x 1 day 

## 2012-09-19 NOTE — ED Provider Notes (Signed)
Chief Complaint  Patient presents with  . Sore Throat    History of Present Illness:   [name] is a 26 year old female who presents today with a two-day history of sore throat, pain on swallowing, a fever blister on her left lower lip, cough productive yellow sputum, vomiting, nausea, rhinorrhea, and neck pain. She denies fever, chills, headache, ear pain, facial pain, wheezing, or chest tightness. She has had no abdominal pain or diarrhea. No known exposures. She has not tried anything for the symptoms. She has a history of asthma and takes as needed albuterol. She is also bipolar and takes Celexa, Abilify, trazodone, and lithium for that. She is allergic to Depakote and Ritalin.  Review of Systems:  Other than noted above, the patient denies any of the following symptoms. Systemic:  No fever, chills, sweats, fatigue, myalgias, headache, or anorexia. Eye:  No redness, pain or drainage. ENT:  No earache, ear congestion, nasal congestion, sneezing, rhinorrhea, sinus pressure, sinus pain, post nasal drip, or sore throat. Lungs:  No cough, sputum production, wheezing, shortness of breath, or chest pain. GI:  No abdominal pain, nausea, vomiting, or diarrhea.  PMFSH:  Past medical history, family history, social history, meds, and allergies were reviewed.  Physical Exam:   Vital signs:  BP 114/73  Pulse 70  Temp 99.5 F (37.5 C) (Oral)  Resp 23  SpO2 95% General:  Alert, in no distress. Eye:  No conjunctival injection or drainage. Lids were normal. ENT:  TMs and canals were normal, without erythema or inflammation.  Nasal mucosa was clear and uncongested, without drainage.  Mucous membranes were moist.  Pharynx was clear, without exudate or drainage.  There were no oral ulcerations or lesions. Neck:  Supple, no adenopathy, tenderness or mass. Lungs:  No respiratory distress.  Lungs were clear to auscultation, without wheezes, rales or rhonchi.  Breath sounds were clear and equal bilaterally.    Heart:  Regular rhythm, without gallops, murmers or rubs. Skin:  Clear, warm, and dry, without rash or lesions.  Labs:   Results for orders placed during the hospital encounter of 09/19/12  POCT RAPID STREP A ([facility] URG CARE ONLY)      Component Value Range   Streptococcus, Group A Screen (Direct) NEGATIVE  NEGATIVE    Assessment:  The encounter diagnosis was Viral pharyngitis.  Plan:   1.  The following meds were prescribed:   New Prescriptions   GUAIFENESIN-CODEINE (ROBITUSSIN AC) 100-10 MG/5ML SYRUP    Take 5 mLs by mouth 3 (three) times daily as needed for cough.   NAPROXEN (NAPROSYN) 500 MG TABLET    Take 1 tablet (500 mg total) by mouth 2 (two) times daily.   ONDANSETRON (ZOFRAN ODT) 8 MG DISINTEGRATING TABLET    Take 1 tablet (8 mg total) by mouth every 8 (eight) hours as needed for nausea.   2.  The patient was instructed in symptomatic care and handouts were given. 3.  The patient was told to return if becoming worse in any way, if no better in 3 or 4 days, and given some red flag symptoms that would indicate earlier return.   Reuben Likes, MD 09/19/12 1256

## 2012-09-19 NOTE — Discharge Instructions (Signed)

## 2012-09-24 ENCOUNTER — Encounter (HOSPITAL_COMMUNITY): Payer: Self-pay | Admitting: *Deleted

## 2012-09-24 ENCOUNTER — Emergency Department (HOSPITAL_COMMUNITY)
Admission: EM | Admit: 2012-09-24 | Discharge: 2012-09-24 | Disposition: A | Discharge status: Home / Self Care | Payer: Medicare Other | Attending: Emergency Medicine | Admitting: Emergency Medicine

## 2012-09-24 DIAGNOSIS — J45909 Unspecified asthma, uncomplicated: Secondary | ICD-10-CM | POA: Insufficient documentation

## 2012-09-24 DIAGNOSIS — K922 Gastrointestinal hemorrhage, unspecified: Secondary | ICD-10-CM | POA: Insufficient documentation

## 2012-09-24 LAB — URINALYSIS, ROUTINE W REFLEX MICROSCOPIC
Bilirubin Urine: NEGATIVE
Glucose, UA: NEGATIVE mg/dL
Ketones, ur: NEGATIVE mg/dL
Nitrite: NEGATIVE
Protein, ur: NEGATIVE mg/dL
Specific Gravity, Urine: 1.016 (ref 1.005–1.030)
Urobilinogen, UA: 1 mg/dL (ref 0.0–1.0)
pH: 8 (ref 5.0–8.0)

## 2012-09-24 LAB — CBC WITH DIFFERENTIAL/PLATELET
Basophils Absolute: 0 10*3/uL (ref 0.0–0.1)
Basophils Relative: 0 % (ref 0–1)
Eosinophils Absolute: 0 10*3/uL (ref 0.0–0.7)
Eosinophils Relative: 0 % (ref 0–5)
HCT: 42.5 % (ref 36.0–46.0)
Hemoglobin: 14 g/dL (ref 12.0–15.0)
Lymphocytes Relative: 27 % (ref 12–46)
Lymphs Abs: 1.5 10*3/uL (ref 0.7–4.0)
MCH: 30.6 pg (ref 26.0–34.0)
MCHC: 32.9 g/dL (ref 30.0–36.0)
MCV: 93 fL (ref 78.0–100.0)
Monocytes Absolute: 0.2 10*3/uL (ref 0.1–1.0)
Monocytes Relative: 3 % (ref 3–12)
Neutro Abs: 3.8 10*3/uL (ref 1.7–7.7)
Neutrophils Relative %: 70 % (ref 43–77)
Platelets: 188 10*3/uL (ref 150–400)
RBC: 4.57 MIL/uL (ref 3.87–5.11)
RDW: 12.7 % (ref 11.5–15.5)
WBC: 5.5 10*3/uL (ref 4.0–10.5)

## 2012-09-24 LAB — URINE MICROSCOPIC-ADD ON

## 2012-09-24 LAB — COMPREHENSIVE METABOLIC PANEL
ALT: 10 U/L (ref 0–35)
AST: 14 U/L (ref 0–37)
Albumin: 3.5 g/dL (ref 3.5–5.2)
Alkaline Phosphatase: 73 U/L (ref 39–117)
BUN: 8 mg/dL (ref 6–23)
CO2: 27 mEq/L (ref 19–32)
Calcium: 9.2 mg/dL (ref 8.4–10.5)
Chloride: 104 mEq/L (ref 96–112)
Creatinine, Ser: 0.96 mg/dL (ref 0.50–1.10)
GFR calc Af Amer: >90 mL/min (ref >90)
GFR calc non Af Amer: 82 mL/min — ABNORMAL LOW (ref >90)
Glucose, Bld: 92 mg/dL (ref 70–99)
Potassium: 4 mEq/L (ref 3.5–5.1)
Sodium: 138 mEq/L (ref 135–145)
Total Bilirubin: 0.2 mg/dL — ABNORMAL LOW (ref 0.3–1.2)
Total Protein: 6.6 g/dL (ref 6.0–8.3)

## 2012-09-24 LAB — PREGNANCY, URINE: Preg Test, Ur: NEGATIVE

## 2012-09-24 NOTE — Discharge Instructions (Signed)
Gastrointestinal Bleeding Gastrointestinal (GI) bleeding means there is bleeding somewhere along the digestive tract, between the mouth and anus. CAUSES  There are many different problems that can cause GI bleeding. Possible causes include:  Esophagitis. This is inflammation, irritation, or swelling of the esophagus.  Hemorrhoids.These are veins that are full of blood (engorged) in the rectum. They cause pain, inflammation, and may bleed.  Anal fissures.These are areas of painful tearing which may bleed. They are often caused by passing hard stool.  Diverticulosis.These are pouches that form on the colon over time, with age, and may bleed significantly.  Diverticulitis.This is inflammation in areas with diverticulosis. It can cause pain, fever, and bloody stools, although bleeding is rare.  Polyps and cancer. Colon cancer often starts out as precancerous polyps.  Gastritis and ulcers.Bleeding from the upper gastrointestinal tract (near the stomach) may travel through the intestines and produce black, sometimes tarry, often bad smelling stools. In certain cases, if the bleeding is fast enough, the stools may not be black, but red. This condition may be life-threatening. SYMPTOMS   Vomiting bright red blood or material that looks like coffee grounds.  Bloody, black, or tarry stools. DIAGNOSIS  Your caregiver may diagnose your condition by taking your history and performing a physical exam. More tests may be needed, including:  X-rays and other imaging tests.  Esophagogastroduodenoscopy (EGD). This test uses a flexible, lighted tube to look at your esophagus, stomach, and small intestine.  Colonoscopy. This test uses a flexible, lighted tube to look at your colon. TREATMENT  Treatment depends on the cause of your bleeding.   For bleeding from the esophagus, stomach, small intestine, or colon, the caregiver doing your EGD or colonoscopy may be able to stop the bleeding as part of  the procedure.  Inflammation or infection of the colon can be treated with medicines.  Many rectal problems can be treated with creams, suppositories, or warm baths.  Surgery is sometimes needed.  Blood transfusions are sometimes needed if you have lost a lot of blood. If bleeding is slow, you may be allowed to go home. If there is a lot of bleeding, you will need to stay in the hospital for observation. HOME CARE INSTRUCTIONS   Take any medicines exactly as prescribed.  Keep your stools soft by eating foods that are high in fiber. These foods include whole grains, legumes, fruits, and vegetables. Prunes (1 to 3 a day) work well for many people.  Drink enough fluids to keep your urine clear or pale yellow.  Take sitz baths if advised. A sitz bath is when you sit in a bathtub with warm water for 10 to 15 minutes to soak, soothe, and cleanse the rectal area. SEEK IMMEDIATE MEDICAL CARE IF:   Your bleeding increases.  You feel lightheaded, weak, or you faint.  You have severe cramps in your back or abdomen.  You pass large blood clots in your stool.  Your problems are getting worse. MAKE SURE YOU:   Understand these instructions.  Will watch your condition.  Will get help right away if you are not doing well or get worse. Document Released: 11/28/2000 Document Revised: 02/23/2012 Document Reviewed: 11/10/2011 [facility] Patient Information 2013 [place].

## 2012-09-24 NOTE — ED Provider Notes (Signed)
History     CSN: [id number]  Arrival date & time 09/24/12  4098   First MD Initiated Contact with Patient 09/24/12 0857      Chief Complaint  Patient presents with  . Rectal Bleeding    (Consider location/radiation/quality/duration/timing/severity/associated sxs/prior treatment) Patient is a 26 y.o. female presenting with hematochezia. The history is provided by the patient.  Rectal Bleeding  Associated symptoms include abdominal pain. Pertinent negatives include no diarrhea, no nausea, no vomiting, no chest pain, no headaches and no rash.   patient presents with blood in the toilet this morning. She thinks it is rectal bleeding. She states her was a lot of blood there. She states the water was red. No other bleeding. She states her abdomen hurts a little bit. She states she cannot be pregnant because she is on birth control. No dysuria. She states that her periods are irregular. No lightheadedness or dizziness.  Past Medical History  Diagnosis Date  . Bipolar disorder   . Attention deficit hyperactivity disorder   . Depression   . Anxiety   . Asthma   . UTI (urinary tract infection)     Completed Cipro 08/06/12    Past Surgical History  Procedure Date  . Mouth surgery   . Eye muscle surgery     07/29/12    Family History  Problem Relation Age of Onset  . Depression Mother   . Depression Father   . Depression Brother     History  Substance Use Topics  . Smoking status: Never Smoker   . Smokeless tobacco: Never Used  . Alcohol Use: No    OB History    Grav Para Term Preterm Abortions TAB SAB Ect Mult Living   0 0              Review of Systems  Constitutional: Negative for activity change and appetite change.  HENT: Negative for neck stiffness.   Eyes: Negative for pain.  Respiratory: Negative for chest tightness and shortness of breath.   Cardiovascular: Negative for chest pain and leg swelling.  Gastrointestinal: Positive for abdominal pain, blood in  stool and hematochezia. Negative for nausea, vomiting and diarrhea.  Genitourinary: Negative for flank pain.  Musculoskeletal: Negative for back pain.  Skin: Negative for rash.  Neurological: Negative for weakness, numbness and headaches.  Psychiatric/Behavioral: Negative for behavioral problems.    Allergies  Divalproex sodium and Methylphenidate derivatives  Home Medications   Current Outpatient Rx  Name Route Sig Dispense Refill  . ARIPIPRAZOLE 20 MG PO TABS Oral Take 20 mg by mouth daily.    [name] CITALOPRAM HYDROBROMIDE 20 MG PO TABS Oral Take 20 mg by mouth daily. For depression.    . GUAIFENESIN-CODEINE 100-10 MG/5ML PO SYRP Oral Take 5 mLs by mouth 3 (three) times daily as needed. Cough    . LITHIUM CARBONATE 300 MG PO TABS Oral Take 300 mg by mouth 2 (two) times daily.    [name] ONDANSETRON 8 MG PO TBDP Oral Take 8 mg by mouth every 8 (eight) hours as needed. Nausea    . TRAZODONE HCL 300 MG PO TABS Oral Take 300 mg by mouth at bedtime. For insomnia.      BP 111/79  Pulse 74  Temp 98.3 F (36.8 C) (Oral)  Resp 16  SpO2 98%  LMP 06/17/2012  Physical Exam  Nursing note and vitals reviewed. Constitutional: She is oriented to person, place, and time. She appears well-developed and well-nourished.  HENT:  Head: Normocephalic  and atraumatic.  Eyes: EOM are normal. Pupils are equal, round, and reactive to light.  Neck: Normal range of motion. Neck supple.  Cardiovascular: Normal rate, regular rhythm and normal heart sounds.   No murmur heard. Pulmonary/Chest: Effort normal and breath sounds normal. No respiratory distress. She has no wheezes. She has no rales.  Abdominal: Soft. Bowel sounds are normal. She exhibits no distension. There is tenderness. There is no rebound and no guarding.       Mildly diffuse abdominal pain without rebound or guarding.  Musculoskeletal: Normal range of motion.  Neurological: She is alert and oriented to person, place, and time. No cranial nerve  deficit.  Skin: Skin is warm and dry.  Psychiatric: She has a normal mood and affect. Her speech is normal.   rectal exam showed minimal tenderness. There was some mild red bleeding. No clear mass. Vaginal exam did not show any blood.  ED Course  Procedures (including critical care time)  Labs Reviewed  COMPREHENSIVE METABOLIC PANEL - Abnormal; Notable for the following:    Total Bilirubin 0.2 (*)     GFR calc non Af Amer 82 (*)     All other components within normal limits  URINALYSIS, ROUTINE W REFLEX MICROSCOPIC - Abnormal; Notable for the following:    APPearance CLOUDY (*)     Hgb urine dipstick MODERATE (*)     Leukocytes, UA SMALL (*)     All other components within normal limits  URINE MICROSCOPIC-ADD ON - Abnormal; Notable for the following:    Squamous Epithelial / LPF MANY (*)     Bacteria, UA FEW (*)     All other components within normal limits  CBC WITH DIFFERENTIAL  PREGNANCY, URINE   No results found.   1. GI bleeding       MDM  Patient with GI bleeding. Normal pulse and normal hemoglobin. I doubt a hemodynamically significant bleed at this time. She'll follow up with gastroenterology or primary care Dr. She was given instructions on what to return to the ER for. She'll be discharged.        Juliet Rude. Rubin Payor, MD 09/24/12 1156

## 2012-09-24 NOTE — ED Notes (Signed)
ZOX:[id number]<AV> Expected date:09/24/12<BR> Expected time: 8:40 AM<BR> Means of arrival:Ambulance<BR> Comments:<BR> Medic 34- 26 y/o F, possible blood in stool vs vag bleed... VS stable

## 2012-09-24 NOTE — ED Notes (Signed)
Pt. Under garments are off. Pt. Set up and ready for pelvic exam.

## 2012-09-24 NOTE — ED Notes (Signed)
Per EMS: Called to home for bright, red blood in toilet this morning. Pt denies any pain.

## 2012-10-04 ENCOUNTER — Ambulatory Visit (HOSPITAL_COMMUNITY): Payer: Self-pay | Admitting: Psychiatry

## 2012-10-05 ENCOUNTER — Emergency Department (HOSPITAL_COMMUNITY)
Admission: EM | Admit: 2012-10-05 | Discharge: 2012-10-05 | Discharge status: Absent without leave | Payer: Medicare Other

## 2012-10-05 NOTE — ED Notes (Signed)
Per Duwayne Heck NT, pt states that she did not want to be seen, but is asking for something to eat.  Pt transported to the ED via EMS.

## 2012-10-06 ENCOUNTER — Inpatient Hospital Stay (HOSPITAL_COMMUNITY)
Admission: RE | Admit: 2012-10-06 | Discharge: 2012-10-08 | DRG: 885 | Disposition: A | Discharge status: Home / Self Care | Payer: Medicare Other | Attending: Psychiatry | Admitting: Psychiatry

## 2012-10-06 ENCOUNTER — Encounter (HOSPITAL_COMMUNITY): Payer: Self-pay

## 2012-10-06 DIAGNOSIS — F3160 Bipolar disorder, current episode mixed, unspecified: Principal | ICD-10-CM | POA: Diagnosis present

## 2012-10-06 DIAGNOSIS — Z733 Stress, not elsewhere classified: Secondary | ICD-10-CM

## 2012-10-06 DIAGNOSIS — Z79899 Other long term (current) drug therapy: Secondary | ICD-10-CM

## 2012-10-06 DIAGNOSIS — J45909 Unspecified asthma, uncomplicated: Secondary | ICD-10-CM | POA: Diagnosis present

## 2012-10-06 DIAGNOSIS — F319 Bipolar disorder, unspecified: Secondary | ICD-10-CM

## 2012-10-06 DIAGNOSIS — F909 Attention-deficit hyperactivity disorder, unspecified type: Secondary | ICD-10-CM | POA: Diagnosis present

## 2012-10-06 DIAGNOSIS — F3132 Bipolar disorder, current episode depressed, moderate: Secondary | ICD-10-CM

## 2012-10-06 LAB — LITHIUM LEVEL: Lithium Lvl: 0.4 mEq/L — ABNORMAL LOW (ref 0.80–1.40)

## 2012-10-06 MED ORDER — ARIPIPRAZOLE 10 MG PO TABS
20.0000 mg | ORAL_TABLET | Freq: Every day | ORAL | Status: DC
  Administered 2012-10-07 – 2012-10-08 (×2): 20 mg via ORAL
  Filled 2012-10-06 (×4): qty 2

## 2012-10-06 MED ORDER — CITALOPRAM HYDROBROMIDE 20 MG PO TABS
20.0000 mg | ORAL_TABLET | Freq: Every day | ORAL | Status: DC
  Administered 2012-10-07 – 2012-10-08 (×2): 20 mg via ORAL
  Filled 2012-10-06 (×5): qty 1

## 2012-10-06 MED ORDER — TRAZODONE HCL 100 MG PO TABS
100.0000 mg | ORAL_TABLET | Freq: Every evening | ORAL | Status: DC | PRN
  Administered 2012-10-06 – 2012-10-07 (×2): 100 mg via ORAL
  Filled 2012-10-06 (×2): qty 1

## 2012-10-06 MED ORDER — LITHIUM CARBONATE 300 MG PO CAPS
300.0000 mg | ORAL_CAPSULE | Freq: Two times a day (BID) | ORAL | Status: DC
  Administered 2012-10-07 – 2012-10-08 (×3): 300 mg via ORAL
  Filled 2012-10-06 (×8): qty 1

## 2012-10-06 MED ORDER — ACETAMINOPHEN 325 MG PO TABS: 650.0000 mg | ORAL_TABLET | Freq: Four times a day (QID) | ORAL | Status: DC | PRN

## 2012-10-06 MED ORDER — MAGNESIUM HYDROXIDE 400 MG/5ML PO SUSP: 30.0000 mL | Freq: Every day | ORAL | Status: DC | PRN

## 2012-10-06 MED ORDER — ALUM & MAG HYDROXIDE-SIMETH 200-200-20 MG/5ML PO SUSP
30.0000 mL | ORAL | Status: DC | PRN
  Administered 2012-10-06: 30 mL via ORAL

## 2012-10-06 NOTE — Progress Notes (Addendum)
Patient ID: [name], female   DOB: August 31, 1986, 26 y.o.   MRN: [id number] D: Pt is awake and active on the unit this PM. Pt denies SI/HI and A/V hallucinations. Pt is participating in the milieu and is cooperative with staff. Pt rates their depression at 8 and hopelessness at 5. Pt mood is alert and her affect is animated. Pt requested to sign a 72 hour request for d/c. Pt states that PSR will allow her to return to the group after d/c and that she feels much better now.   A: Writer utilized therapeutic communication, encouraged pt to discuss feelings with staff and administered medication per MD orders. Writer also encouraged pt to attend groups. Writer explained that she could sign because she was voluntary but that did not mean she would definately leave.     R: Pt is attending groups and tolerating medications well. Writer will continue to monitor. 15 minute checks are ongoing for safety. Pt verbalized understanding but hopes to leave as early as tomorrow. Pt signed 72 hour request for d/c at 1816 hrs today. Writer also encouraged pt to put what she has learned into practice in the future to cope with her stressors. Pt acknowledged understanding of this information.

## 2012-10-06 NOTE — Tx Team (Signed)
Initial Interdisciplinary Treatment Plan  PATIENT STRENGTHS: (choose at least two) General fund of knowledge Motivation for treatment/growth Supportive family/friends  PATIENT STRESSORS: Financial difficulties Legal issue Loss of significant relationship with boyfriend*   PROBLEM LIST: Problem List/Patient Goals Date to be addressed Date deferred Reason deferred Estimated date of resolution                                                         DISCHARGE CRITERIA:  Improved stabilization in mood, thinking, and/or behavior Motivation to continue treatment in a less acute level of care Verbal commitment to aftercare and medication compliance  PRELIMINARY DISCHARGE PLAN: Return to previous living arrangement  PATIENT/FAMIILY INVOLVEMENT: This treatment plan has been presented to and reviewed with the patient, [name]. The patient and family have been given the opportunity to ask questions and make suggestions.  Alfonse Spruce 10/06/2012, 1:19 PM

## 2012-10-06 NOTE — Progress Notes (Signed)
Patient ID: [name], female   DOB: 06-18-1986, 26 y.o.   MRN: [id number] Pt recently broke up with boyfriend and wants to get help to "get stable", Pt has been very depressed rates depression at an 8/10 and anxiety at 5/10, states that yesterday she was having SI without a plan, pt denies SI/HI/AVH today, pt states that she has had difficulty focusing, pt is unemployed and has been having financial trouble in addition to break up.  Pt is developmentally delayed but is coherent and thought processes are intact, Hx asthma, ADD, bipolar and depression, pt states that she was recently started on Lithium and gets her medications from [facility] on [address].
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
## 2012-10-06 NOTE — Care Management Utilization Note (Signed)
Per State Regulation 482.30  This chart was reviewed for medical necessity with respect to the patient's Admission/Duration of stay.   Next review due:  10/08/12   Ambrose Mantle, LCSW  10/06/2012  4:41 PM

## 2012-10-07 DIAGNOSIS — F319 Bipolar disorder, unspecified: Secondary | ICD-10-CM

## 2012-10-07 NOTE — Progress Notes (Signed)
[facility] Adult Inpatient Family/Significant Other Suicide Prevention Education  Suicide Prevention Education:  Education Completed; Share Etten - mother [phone]),  (name of family member/significant other) has been identified by the patient as the family member/significant other with whom the patient will be residing, and identified as the person(s) who will aid the patient in the event of a mental health crisis (suicidal ideations/suicide attempt).  With written consent from the patient, the family member/significant other has been provided the following suicide prevention education, prior to the and/or following the discharge of the patient.  The suicide prevention education provided includes the following:  Suicide risk factors  Suicide prevention and interventions  National Suicide Hotline telephone number  [facility] assessment telephone number  [facility] Emergency Assistance 911  [facility] and/or Residential Mobile Crisis Unit telephone number  Request made of family/significant other to:  Remove weapons (e.g., guns, rifles, knives), all items previously/currently identified as safety concern.    Remove drugs/medications (over-the-counter, prescriptions, illicit drugs), all items previously/currently identified as a safety concern.  The family member/significant other verbalizes understanding of the suicide prevention education information provided.  The family member/significant other agrees to remove the items of safety concern listed above. Ms. Byers states that if pt could "just forget this boy" she would be fine.  Ms. Guglielmetti states that pt has been in and out of the hospital this past year due to problems with this relationship.  Ms. Vanhorne states that every time she has a problem with her boyfriend she is depressed and ends up in the hospital.    [name] 10/07/2012, 10:11 AM

## 2012-10-07 NOTE — Social Work (Signed)
Aftercare Planning Group: 10/07/2012 9:45 AM  Pt attended discharge planning group and actively participated in group.  SW provided pt with today's workbook.  Pt presents with calm mood and affect.  Pt rates depression and anxiety at a 0 today.  Pt denies SI/HI today.  Pt states that she came to the hospital to "get stable".  Pt states that she feels better today because she was able to think a lot.  Pt states that she feels ready to d/c.  Pt states that she will return home with her mom and has access to transportation.  Pt states that she sees [employer] and Corie Chiquito for therapy and medication management.  SW secured pt's follow up appointments.  No further needs voiced by pt at this time.  Safety planning and suicide prevention discussed.  Pt participated in discussion and acknowledged an understanding of the information provided.       [facility] Group Notes:  (Counselor/Nursing/MHT/Case Management/Adjunct)  10/07/2012  1:15 PM  Type of Therapy:  Group Therapy  Participation Level:  Active  Participation Quality:  Appropriate and Attentive  Affect:  Appropriate  Cognitive:  Alert and Appropriate  Insight:  Good  Engagement in Group:  Good  Engagement in Therapy:  Good  Modes of Intervention:  Clarification, Education, Socialization and Support  Summary of Progress/Problems: Patient was attentive and engaged with speaker from [employer].  Patient expressed interest in their programs and services.  Patient processed ways they can relate to the speaker.      Reyes Ivan, LCSWA 10/07/2012 9:50 AM

## 2012-10-07 NOTE — Progress Notes (Signed)
D:   Patient's self inventory sheet, patient sleeps well, has good appetite, normal energy level, good attention span.   Denied depression and hopelessness.   Denied withdrawals.   Denied SI.   Denied physical problems.  After discharge, will "stay stable, stay focused, don't worry about Thayer Ohm.   A:  Medications given per MD order.  Support and encouragement given throughout day.  Support and safety completed as ordered. R:  Following treatment plan.   Denied SI and HI.   Denied A/V hallucinations.  Contracts for safety.  Patient remains safe and receptive on unit.

## 2012-10-07 NOTE — Progress Notes (Signed)
Patient ID: [name], female   DOB: 1986-03-16, 26 y.o.   MRN: [id number] D: Pt. In bed eyes closed, resp. Even, unlabored. A: Pt. Will be monitored q75min for safety. Writer will monitor for s/s of distress. R: Pt. Is safe on the unit. No distress noted.
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
## 2012-10-07 NOTE — Progress Notes (Signed)
Psychoeducational Group Note  Date:  10/07/2012 Time: 2000  Group Topic/Focus:  Wrap-Up Group:   The focus of this group is to help patients review their daily goal of treatment and discuss progress on daily workbooks.  Participation Level:  Minimal  Participation Quality:  Appropriate  Affect:  Irritable  Cognitive:  Confused  Insight:  Limited  Engagement in Group:  Limited  Additional Comments:  Patient shared that she was not feeling well and wants to focus on going home.  Custer Pimenta, Newton Pigg 10/07/2012, 12:26 AM

## 2012-10-07 NOTE — Progress Notes (Signed)
Psychoeducational Group Note  Date:  10/07/2012 Time:  1100  Group Topic/Focus:  Therapeutic group   Participation Level:  Active  Participation Quality:  Appropriate, Attentive, Sharing and Supportive  Affect:  Appropriate  Cognitive:  Alert and Appropriate  Insight:  Good  Engagement in Group:  Good  Additional Comments:  Productive group  Earline Mayotte 10/07/2012, 5:23 PM

## 2012-10-07 NOTE — Progress Notes (Signed)
Psychoeducational Group Note  Date:  10/07/2012 Time:  1000 Group Topic/Focus:  Rediscovering Joy:   The focus of this group is to explore various ways to relieve stress in a positive manner.  Participation Level:  Active  Participation Quality:  Appropriate, Attentive and Sharing  Affect:  Appropriate  Cognitive:  Appropriate  Insight:  Good  Engagement in Group:  Good  Additional Comments: Patient participated in Rediscovering Joy group. Patient stated ways humor and laughter can have a positive benefit on the body and mind. Patient also states ways humor can be brought into your life by activities that involve humor and make it a positive happy to be involved in positive activities. Patient also stated one thing under the 5 senses: seeing, smell, touch, taste, hearing how one thing that brings joy into patient life that could be used as a coping mechanism.     Karleen Hampshire Brittini 10/07/2012, 6:57 PM
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
## 2012-10-07 NOTE — Progress Notes (Signed)
Patient ID: [name], female   DOB: 20-Apr-1986, 26 y.o.   MRN: [id number] D: Pt. Pacing the halls, on phone off and on. Pt. Appears restless, but denies depression. A: Pt. Will be monitored q59min for safety. Staff encouraged karaoke. Writer provided positive support encouraged pt. To verbalized feelings to help relieve anxiety. R: Pt. Is safe on the unit. Pt. Attended karaoke. Pt. Reports no concerns at this time.

## 2012-10-07 NOTE — Progress Notes (Signed)
Psychoeducational Group Note  Date:  10/07/2012 Time:  2000  Group Topic/Focus:  Karaoke  Participation Level:  Active  Participation Quality:  Appropriate  Affect:  blunted  Cognitive:  Alert  Insight:  Limited  Engagement in Group:  Good  Additional Comments:    Humberto Seals Monique 10/07/2012, 10:52 PM

## 2012-10-07 NOTE — Progress Notes (Addendum)
Adult Psychosocial Assessment Update Interdisciplinary Team  [facility] admissions/discharges:  Admissions Discharges  Date: 06/01/12 Date: 06/03/12  Date: Date:  Date: Date:  Date: Date:  Date: Date:   Changes since the last Psychosocial Assessment (including adherence to outpatient mental health and/or substance abuse treatment, situational issues contributing to decompensation and/or relapse). Pt reports coming to the hospital to "get stable".  Pt reports she broke up with her boyfriend of 1 1/2 years approximately 4 days ago and she has been severely depressed from this loss.  Pt has been following up with her outpatient providers.  Pt was seeing Dr. Lolly Mustache for medication management but states that she switched providers.  Pt can return home where she resides with her mom.               Discharge Plan 1. Will you be returning to the same living situation after discharge?   Yes: [name] will be returning home where she resides with her mother. No:      If no, what is your plan?           2. Would you like a referral for services when you are discharged? Yes: [name] would like to be referred back to her outpatient providers Lala Lund for therapy and Corie Chiquito for medication management.     If yes, for what services?  No:              Summary and Recommendations (to be completed by the evaluator) Patient is a 26 year old Caucasian Female that resides with family in [place].  Patient will benefit from crisis stabilization, medication evaluation, group therapy and psycho education in addition to case management for discharge planning.                         Signature:  Carmina Miller, 10/07/2012 9:28 AM

## 2012-10-08 DIAGNOSIS — F3132 Bipolar disorder, current episode depressed, moderate: Secondary | ICD-10-CM

## 2012-10-08 MED ORDER — ARIPIPRAZOLE 20 MG PO TABS: 20.0000 mg | ORAL_TABLET | Freq: Every day | ORAL | Status: DC

## 2012-10-08 MED ORDER — CITALOPRAM HYDROBROMIDE 20 MG PO TABS: 20.0000 mg | ORAL_TABLET | Freq: Every day | ORAL | Status: DC

## 2012-10-08 MED ORDER — LITHIUM CARBONATE 300 MG PO CAPS: 300.0000 mg | ORAL_CAPSULE | Freq: Two times a day (BID) | ORAL | Status: DC

## 2012-10-08 NOTE — Progress Notes (Signed)
DC note: Pt given AVS, states she understands, and will   Comply With f/u plan. SHe denies SI, states she  Is ready" to go. She signed appropriate paperwork, is given presciptions for meds per MD and will  Leave when family member presents to [facility] to drive pt home PD RN [facility]

## 2012-10-08 NOTE — Progress Notes (Signed)
D Pt is seen out in the milieu today. She has a limited, and narrow view  of things, but can contract for safety, denies SI, rates her depression and hopelessness "0/0" and says her DC plan is to " stay stable, cont to take meds, go to [facility], and stay in program."   A  SHe is slated for DC this afternoon.   R Safety is in place and POC incldues continuing to finalize and execute DC plans today .

## 2012-10-08 NOTE — Progress Notes (Signed)
[facility] Case Management Discharge Plan:  Will you be returning to the same living situation after discharge: Yes,  returning home with mom At discharge, do you have transportation home?:Yes,  family will pick pt up Do you have the ability to pay for your medications:Yes,  access to meds  Release of information consent forms completed and in the chart;  Patient's signature needed at discharge.  Patient to Follow up at:  Follow-up Information    Follow up with Lala Lund, PhD.   Contact information:   [address] [place] [place], [place] [address] [phone]      Follow up with Corie Chiquito. On 10/18/2012. (Appointment scheduled at 1:30 pm)    Contact information:   [address] [place], [place] [address] [phone]      Follow up with [facility]. On 10/11/2012. (Monday - Friday 9 am - 2 pm)    Contact information:   [address] [place], [place] [address]  [phone]            Patient denies SI/HI:   Yes,  denies SI/HI today    Safety Planning and Suicide Prevention discussed:  Yes,  discussed with pt  Barrier to discharge identified:No.  Summary and Recommendations: Pt attended discharge planning group and actively participated in group.  SW provided pt with today's workbook.  Pt presents with calm mood and affect.  Pt rates depression and anxiety at a 0 today.  Pt denies SI/HI.  Pt reports feeling stable to d/c today.  Pt was interested in getting information about group homes and assisted living facilities.  SW provided pt with info.  No recommendations from SW.  No further needs voiced by pt.  Pt stable to discharge.     Carmina Miller 10/08/2012, 10:05 AM

## 2012-10-08 NOTE — Discharge Summary (Signed)
Physician Discharge Summary Note  Patient:  [name] is an 26 y.o., female MRN:  [id number] DOB:  [birth year] Patient phone:  [phone] (home)  Patient address:   [address] [place] [address]   Date of Admission:  10/06/2012 Date of Discharge: 10/08/2012  Discharge Diagnoses: Bipolar d/o  Axis Diagnosis: AXIS I: Bipolar, mixed  AXIS II: No diagnosis  AXIS III:  Past Medical History   Diagnosis  Date   .  Chronic kidney disease      hx of polycystic kidney disease with left nephrectomy   .  Abnormal Pap smear      colpo/LGSIL   .  McCune-Albright syndrome      brittle bones   .  Hypertension      No history of med use   .  Genital herpes  2007     last outbreak Dec 2012   .  Speech impediment     AXIS IV: occupational problems and other psychosocial or environmental problems  AXIS V: 61-70 mild symptoms       Level of Care:  Inpatient Hospitalization.  Hospital Course:   The patient attended treatment team meeting this am and met with treatment team members. The patient's symptoms, treatment plan and response to treatment was discussed. The patient endorsed that their symptoms have improved. The patient also stated that they felt stable for discharge.  They reported that from this hospital stay they had learned many coping skills.  In other to maintain their psychiatric stability, they will continue psychiatric care on an outpatient basis. They will follow-up as outlined below.  In addition they were instructed  to take all your medications as prescribed by their mental healthcare provider and to report any adverse effects and or reactions from your medicines to their outpatient provider promptly.  The patient is also instructed and cautioned to not engage in alcohol and or illegal drug use while on prescription medicines.  In the event of worsening symptoms the patient is instructed to call the crisis hotline, 911 and or go to the nearest ED for  appropriate evaluation and treatment of symptoms.   Also while a patient in this hospital, the patient received medication management for his psychiatric symptoms. They were ordered and received as outlined below:    Medication List     As of 10/08/2012 12:49 PM    STOP taking these medications         ondansetron 8 MG disintegrating tablet   Commonly known as: ZOFRAN-ODT      TAKE these medications      Indication    ARIPiprazole 20 MG tablet   Commonly known as: ABILIFY   Take 20 mg by mouth daily.       ARIPiprazole 20 MG tablet   Commonly known as: ABILIFY   Take 1 tablet (20 mg total) by mouth daily.    Indication: Major Depressive Disorder      citalopram 20 MG tablet   Commonly known as: CELEXA   Take 20 mg by mouth daily. For depression.       citalopram 20 MG tablet   Commonly known as: CELEXA   Take 1 tablet (20 mg total) by mouth daily.    Indication: Depression      lithium carbonate 300 MG capsule   Take 1 capsule (300 mg total) by mouth 2 (two) times daily with a meal.    Indication: Manic-Depression      lithium 300  MG tablet   Take 300 mg by mouth 2 (two) times daily.       trazodone 300 MG tablet   Commonly known as: DESYREL   Take 300 mg by mouth at bedtime. For insomnia.        They were also enrolled in group counseling sessions and activities in which they participated actively.       Follow-up Information    Schedule an appointment as soon as possible for a visit with Lala Lund, PhD.   Contact information:   [address] [place] [place], [place] [address] [phone]      Follow up with Corie Chiquito. On 10/18/2012. (Appointment scheduled at 1:30 pm)    Contact information:   [address] [place], [place] [address] [phone]      Follow up with [facility] - [facility]. On 10/11/2012. (Monday - Friday 9 am - 2 pm)    Contact information:   [address] [place], [place] [address]  [phone]            Upon  discharge, patient adamantly denies suicidal, homicidal ideations, auditory, visual hallucinations and or delusional thinking. They left [facility] with all personal belongings via personal transportation in no apparent distress.  Consults:  Please see electronic medical record for details.  Significant Diagnostic Studies:  Please see electronic medical record for details.  Discharge Vitals:   Blood pressure 101/66, pulse 87, temperature 97.6 F (36.4 C), temperature source Oral, resp. rate 16, height 5\' 6"  (1.676 m), weight 102.513 kg (226 lb), last menstrual period 06/17/2012..  Mental Status Exam: See Mental Status Examination and Suicide Risk Assessment completed by Attending Physician prior to discharge.  Discharge destination:  Home  Is patient on multiple antipsychotic therapies at discharge:  No  Has Patient had three or more failed trials of antipsychotic monotherapy by history: N/A Recommended Plan for Multiple Antipsychotic Therapies: N/A Discharge Orders    Future Orders Please Complete By Expires   Diet - low sodium heart healthy      Increase activity slowly          Medication List     As of 10/08/2012 12:49 PM    STOP taking these medications         ondansetron 8 MG disintegrating tablet   Commonly known as: ZOFRAN-ODT      TAKE these medications      Indication    ARIPiprazole 20 MG tablet   Commonly known as: ABILIFY   Take 20 mg by mouth daily.       ARIPiprazole 20 MG tablet   Commonly known as: ABILIFY   Take 1 tablet (20 mg total) by mouth daily.    Indication: Major Depressive Disorder      citalopram 20 MG tablet   Commonly known as: CELEXA   Take 20 mg by mouth daily. For depression.       citalopram 20 MG tablet   Commonly known as: CELEXA   Take 1 tablet (20 mg total) by mouth daily.    Indication: Depression      lithium carbonate 300 MG capsule   Take 1 capsule (300 mg total) by mouth 2 (two) times daily with a meal.    Indication:  Manic-Depression      lithium 300 MG tablet   Take 300 mg by mouth 2 (two) times daily.       trazodone 300 MG tablet   Commonly known as: DESYREL   Take  300 mg by mouth at bedtime. For insomnia.            Follow-up Information    Schedule an appointment as soon as possible for a visit with Lala Lund, PhD.   Contact information:   [address] [place] [place], [place] [address] [phone]      Follow up with Corie Chiquito. On 10/18/2012. (Appointment scheduled at 1:30 pm)    Contact information:   [address] [place], [place] [address] [phone]      Follow up with [facility] - [facility]. On 10/11/2012. (Monday - Friday 9 am - 2 pm)    Contact information:   [address] [place], [place] [address]  [phone]           Follow-up recommendations:   Activities: Resume typical activities Diet: Resume typical diet Other: Follow up with outpatient provider and report any side effects to out patient prescriber.  Comments:  Take all your medications as prescribed by your mental healthcare provider. Report any adverse effects and or reactions from your medicines to your outpatient provider promptly. Patient is instructed and cautioned to not engage in alcohol and or illegal drug use while on prescription medicines. In the event of worsening symptoms, patient is instructed to call the crisis hotline, 911 and or go to the nearest ED for appropriate evaluation and treatment of symptoms. Follow-up with your primary care provider for your other medical issues, concerns and or health care needs.  SignedPatrick North 10/08/2012 12:49 PM

## 2012-10-08 NOTE — [facility] Suicide Risk Assessment (Signed)
Suicide Risk Assessment  Discharge Assessment     Demographic Factors:  Female, Caucasian, Unemployed  Mental Status Per Nursing Assessment::   On Admission:  Suicidal ideation indicated by patient (PTA, denies now)  Current Mental Status by Physician: Patient alert and oriented to 4. Mood has improved. Denies AH/VH/SI/HI.  Loss Factors: Decrease in vocational status  Historical Factors: Family history of mental illness or substance abuse and Impulsivity  Risk Reduction Factors:   Living with another person, especially a relative and Positive social support  Continued Clinical Symptoms:  Bipolar Disorder:   Mixed State Depression:   Recent sense of peace/wellbeing  Cognitive Features That Contribute To Risk:  Thought constriction (tunnel vision)    Suicide Risk:  Minimal: No identifiable suicidal ideation.  Patients presenting with no risk factors but with morbid ruminations; may be classified as minimal risk based on the severity of the depressive symptoms  Discharge Diagnoses:   AXIS I:  Bipolar, mixed AXIS II:  No diagnosis AXIS III:   Past Medical History  Diagnosis Date  . Bipolar disorder   . Attention deficit hyperactivity disorder   . Depression   . Anxiety   . Asthma   . UTI (urinary tract infection)     Completed Cipro 08/06/12   AXIS IV:  economic problems and occupational problems AXIS V:  61-70 mild symptoms  Plan Of Care/Follow-up recommendations:  Activity:  normal Diet:  normal Follow up with outpatient therapy.  Is patient on multiple antipsychotic therapies at discharge:  No   Has Patient had three or more failed trials of antipsychotic monotherapy by history:  No  Recommended Plan for Multiple Antipsychotic Therapies: NA  Gabbrielle Mcnicholas 10/08/2012, 9:58 AM

## 2012-10-08 NOTE — Tx Team (Signed)
Interdisciplinary Treatment Plan Update (Adult)  Date:  10/08/2012  Time Reviewed:  10:42 AM   Progress in Treatment: Attending groups: Yes Participating in groups:  Yes Taking medication as prescribed: Yes Tolerating medication:  Yes Family/Significant other contact made: Yes, contact made with mother Patient understands diagnosis:  Yes Discussing patient identified problems/goals with staff:  Yes Medical problems stabilized or resolved:  Yes Denies suicidal/homicidal ideation: Yes Issues/concerns per patient self-inventory:  None identified Other: N/A  New problem(s) identified: None Identified  Reason for Continuation of Hospitalization: Stable to d/c  Interventions implemented related to continuation of hospitalization: Stable to d/c  Additional comments: N/A  Estimated length of stay: D/C today  Discharge Plan: Pt will follow up with [facility] for PSR, [employer] and Corie Chiquito for medication management and therapy.    New goal(s): N/A  Review of initial/current patient goals per problem list:    1.  Goal(s): Reduce depressive symptoms  Met:  Yes  Target date: by discharge  As evidenced by: Reducing depression from a 10 to a 3 as reported by pt.  Pt rates at a 0 today.   2.  Goal (s): Reduce/Eliminate suicidal ideation  Met:  Yes  Target date: by discharge  As evidenced by: pt reporting no SI.    3.  Goal(s): Reduce anxiety symptoms  Met:  Yes  Target date: by discharge  As evidenced by: Reduce anxiety from a 10 to a 3 as reported by pt. Pt rates at a 0 today.     Attendees: Patient:  [name]  10/08/2012 10:42 AM   Family:     Physician:  Patrick North, MD 10/08/2012 10:42 AM   Nursing:   Lamount Cranker, RN 10/08/2012 10:42 AM   Case Manager:  Reyes Ivan, LCSWA 10/08/2012 10:42 AM   Other:  10/08/2012 10:42 AM   Other:  Juline Patch, LCSW 10/08/2012 10:42 AM   Other:    Other:     Other:      Scribe for Treatment  Team:   Carmina Miller, 10/08/2012 10:42 AM

## 2012-10-12 ENCOUNTER — Ambulatory Visit (HOSPITAL_COMMUNITY): Payer: Self-pay | Admitting: Psychiatry

## 2012-10-13 ENCOUNTER — Telehealth: Payer: Self-pay | Admitting: *Deleted

## 2012-10-13 NOTE — Telephone Encounter (Signed)
Mother of pt called - needs GI referral per [facility] - seen few weeks ago for rectal bleeding and has cont. ER suggest Dr Arlyce Dice Needs PM appt. Stanton Kidney Arvle Grabe RN 10/13/12 4:10PM

## 2012-10-13 NOTE — Progress Notes (Signed)
Patient Discharge Instructions:  After Visit Summary (AVS):   Faxed to:  10/13/12 Psychiatric Admission Assessment Note:   Faxed to:  10/13/12 Suicide Risk Assessment - Discharge Assessment:   Faxed to:  10/13/12 Faxed/Sent to the Next Level Care provider:  10/13/12 Faxed to [facility] @ [phone] Faxed to [facility] @ [phone] Faxed to [facility] @ [phone]   Jerelene Redden, 10/13/2012, 2:20 PM

## 2012-10-14 ENCOUNTER — Telehealth: Payer: Self-pay | Admitting: *Deleted

## 2012-10-14 NOTE — Telephone Encounter (Signed)
Talked with mother G. Whittaker - Dr Dierdre Searles would like pt to be checked first in clinic for rectal bleeding before doing referral. Unable to come today. Dr Dierdre Searles aware - Dr Allena Katz  10/18/12 3:45pm. Stanton Kidney Finnlee Silvernail RN 10/14/12 3:50PM+

## 2012-10-15 NOTE — Telephone Encounter (Signed)
Front desk pool to make appt to be seen in clinic before referral is made per [facility].

## 2012-10-18 ENCOUNTER — Ambulatory Visit (INDEPENDENT_AMBULATORY_CARE_PROVIDER_SITE_OTHER): Payer: Medicare Other | Admitting: Internal Medicine

## 2012-10-18 ENCOUNTER — Encounter: Payer: Self-pay | Admitting: Internal Medicine

## 2012-10-18 VITALS — BP 116/81 | HR 66 | Temp 98.9°F | Wt 229.1 lb

## 2012-10-18 DIAGNOSIS — K921 Melena: Secondary | ICD-10-CM

## 2012-10-18 DIAGNOSIS — F319 Bipolar disorder, unspecified: Secondary | ICD-10-CM

## 2012-10-18 NOTE — Patient Instructions (Addendum)
Please make a followup appointment as needed. Get sample on the stool cards and mail it back to the clinic. If you have any more bleeding, call the clinic to make an urgent appointment or go to the ED.  Use over-the-counter stool softeners if you have constipation.

## 2012-10-18 NOTE — Progress Notes (Signed)
  Subjective:    Patient ID: [name], female    DOB: 1986/03/28, 26 y.o.   MRN: [id number]  HPI patient is a 26 year old woman with past history of bipolar affective disorder who comes to the clinic for followup for rectal bleeding. She was seen in ED on 09/24/2012 for rectal bleeding- was found to be stable, with stable hemoglobin vital signs and was discharged home. Was advised see primary care or GI. She says that she had bleeding for about 2 weeks until last Friday- when she stopped breathing. She did complain of some pain with bowel movements and also reports having constipation for past few weeks. She was started on lithium recently for her bipolar, per her psychiatrist. Patient denies any fever, chills, nausea, vomiting, urinary discharge, headache, palpitations. No other new medications except lithium.  Review of Systems    As per history of present illness, all other systems reviewed and negative  Objective:   Physical Exam  General: Mildly anxious. HEENT: PERRL, EOMI, no scleral icterus Cardiac: S1, S2, RRR, no rubs, murmurs or gallops Pulm: clear to auscultation bilaterally, moving normal volumes of air Abd: soft, nontender, nondistended, BS present Ext: warm and well perfused, no pedal edema Neuro: alert and oriented X3, cranial nerves II-XII grossly intact       Assessment & Plan:

## 2012-10-18 NOTE — Assessment & Plan Note (Signed)
Patient has had previous history of reported blood in stool. I do not think [name] had a GI evaluation yet. Hemoglobin stable. Vitals stable. No more bleeding for past 3 days. Last bowel movement normal yesterday afternoon. Advised her to take 3 Hemoccult cards at home and mail back- if any of them shows occult bleeding, will consider GI referral. [name] refused getting rectal exam today. Says that [name] does not need it as [name] is complaining any more.

## 2012-10-18 NOTE — Assessment & Plan Note (Signed)
Recently started on lithium per psychiatrist. Continue medication regimen. Advised to use a stool softener if she feels she is constipated.

## 2012-10-20 ENCOUNTER — Encounter (HOSPITAL_COMMUNITY): Payer: Self-pay | Admitting: Emergency Medicine

## 2012-10-20 ENCOUNTER — Emergency Department (HOSPITAL_COMMUNITY)
Admission: EM | Admit: 2012-10-20 | Discharge: 2012-10-20 | Disposition: A | Discharge status: Home / Self Care | Payer: Medicare Other | Attending: Emergency Medicine | Admitting: Emergency Medicine

## 2012-10-20 ENCOUNTER — Emergency Department (HOSPITAL_COMMUNITY): Payer: Medicare Other

## 2012-10-20 DIAGNOSIS — J069 Acute upper respiratory infection, unspecified: Secondary | ICD-10-CM

## 2012-10-20 DIAGNOSIS — Z79899 Other long term (current) drug therapy: Secondary | ICD-10-CM | POA: Insufficient documentation

## 2012-10-20 DIAGNOSIS — J45909 Unspecified asthma, uncomplicated: Secondary | ICD-10-CM | POA: Insufficient documentation

## 2012-10-20 DIAGNOSIS — F319 Bipolar disorder, unspecified: Secondary | ICD-10-CM | POA: Insufficient documentation

## 2012-10-20 DIAGNOSIS — F329 Major depressive disorder, single episode, unspecified: Secondary | ICD-10-CM | POA: Insufficient documentation

## 2012-10-20 DIAGNOSIS — F3289 Other specified depressive episodes: Secondary | ICD-10-CM | POA: Insufficient documentation

## 2012-10-20 DIAGNOSIS — R059 Cough, unspecified: Secondary | ICD-10-CM | POA: Insufficient documentation

## 2012-10-20 DIAGNOSIS — R0789 Other chest pain: Secondary | ICD-10-CM

## 2012-10-20 DIAGNOSIS — Z87440 Personal history of urinary (tract) infections: Secondary | ICD-10-CM | POA: Insufficient documentation

## 2012-10-20 DIAGNOSIS — F909 Attention-deficit hyperactivity disorder, unspecified type: Secondary | ICD-10-CM | POA: Insufficient documentation

## 2012-10-20 DIAGNOSIS — R071 Chest pain on breathing: Secondary | ICD-10-CM | POA: Insufficient documentation

## 2012-10-20 DIAGNOSIS — F411 Generalized anxiety disorder: Secondary | ICD-10-CM | POA: Insufficient documentation

## 2012-10-20 DIAGNOSIS — R05 Cough: Secondary | ICD-10-CM | POA: Insufficient documentation

## 2012-10-20 MED ORDER — FAMOTIDINE 20 MG PO TABS: 20.0000 mg | ORAL_TABLET | Freq: Two times a day (BID) | ORAL | Status: DC

## 2012-10-20 MED ORDER — GUAIFENESIN ER 1200 MG PO TB12: 1.0000 | ORAL_TABLET | Freq: Two times a day (BID) | ORAL | Status: DC

## 2012-10-20 MED ORDER — IBUPROFEN 800 MG PO TABS: 800.0000 mg | ORAL_TABLET | Freq: Three times a day (TID) | ORAL | Status: DC | PRN

## 2012-10-20 MED ORDER — GI COCKTAIL ~~LOC~~: 30.0000 mL | Freq: Once | ORAL | Status: DC

## 2012-10-20 MED ORDER — PROMETHAZINE-DM 6.25-15 MG/5ML PO SYRP: 5.0000 mL | ORAL_SOLUTION | Freq: Four times a day (QID) | ORAL | Status: DC | PRN

## 2012-10-20 NOTE — ED Provider Notes (Signed)
History     CSN: [id number]  Arrival date & time 10/20/12  4098   First MD Initiated Contact with Patient 10/20/12 [phone]      Chief Complaint  Patient presents with  . Chest Pain    (Consider location/radiation/quality/duration/timing/severity/associated sxs/prior treatment) HPI Pt is a 26 yo female who presents to the ER with a chief complaint of chest pain.  The pain began this morning upon waking up.  She describes it as a sharp pain that is constant, and aggravated by deep breathing, coughing, and rotating her torso.  Nothing makes it better.  Pt states that she has had a cough for 2 weeks.  She has also had sore throat and congestion.  Pt states that she has been vomiting 1 or 2 times a day for 2 weeks associated with coughing.  Vomiting is not associated with meals.  Pt used to take Protonix for GERD, but hasn't for a while for financial reasons.  Pt is having 1 BM more per day than usually and it is like diarrhea.  Pt has no family history of MI at a young age.  Pt denies fever, chills, SOB, abdominal pain, pelvic pain, dysuria, and leg swelling.  Past Medical History  Diagnosis Date  . Bipolar disorder   . Attention deficit hyperactivity disorder   . Depression   . Anxiety   . Asthma   . UTI (urinary tract infection)     Completed Cipro 08/06/12    Past Surgical History  Procedure Date  . Mouth surgery   . Eye muscle surgery     07/29/12    Family History  Problem Relation Age of Onset  . Depression Mother   . Depression Father   . Depression Brother     History  Substance Use Topics  . Smoking status: Never Smoker   . Smokeless tobacco: Never Used  . Alcohol Use: No    OB History    Grav Para Term Preterm Abortions TAB SAB Ect Mult Living   0 0              Review of Systems All other systems negative except as documented in the HPI. All pertinent positives and negatives as reviewed in the HPI.   Allergies  Divalproex sodium and Methylphenidate  derivatives  Home Medications   Current Outpatient Rx  Name  Route  Sig  Dispense  Refill  . ARIPIPRAZOLE 20 MG PO TABS   Oral   Take 1 tablet (20 mg total) by mouth daily.   30 tablet   0   . CITALOPRAM HYDROBROMIDE 20 MG PO TABS   Oral   Take 20 mg by mouth daily. For depression.         [name] LITHIUM CARBONATE 300 MG PO CAPS   Oral   Take 1 capsule (300 mg total) by mouth 2 (two) times daily with a meal.   60 capsule   0   . TRAZODONE HCL 300 MG PO TABS   Oral   Take 300 mg by mouth at bedtime. For insomnia.           BP 108/71  Pulse 71  Temp 98.1 F (36.7 C) (Oral)  Resp 18  SpO2 100%  LMP 06/17/2012  Physical Exam  Constitutional: She is oriented to person, place, and time. She appears well-developed and well-nourished. No distress.  HENT:  Head: Normocephalic and atraumatic.  Mouth/Throat: Oropharynx is clear and moist.  Cardiovascular: Normal rate, regular rhythm and  normal heart sounds.   Pulmonary/Chest: Effort normal and breath sounds normal. No respiratory distress. She has no wheezes. She has no rales.       Chest tenderness near sternum below the clavicle bilaterally  Abdominal: Soft. Bowel sounds are normal. She exhibits no distension. There is no tenderness.  Musculoskeletal: Normal range of motion. She exhibits no edema.  Neurological: She is alert and oriented to person, place, and time.  Skin: Skin is warm and dry.    ED Course  Procedures (including critical care time)  Labs Reviewed - No data to display Dg Chest 2 View  10/20/2012  *RADIOLOGY REPORT*  Clinical Data: Chest pain.  Asthma.  CHEST - 2 VIEW  Comparison: 09/01/2012.  12/12/2011.  Findings:  Cardiopericardial silhouette within normal limits. Mediastinal contours normal. Trachea midline.  No airspace disease or effusion.  IMPRESSION: Negative two-view chest.   Original Report Authenticated By: Andreas Newport, M.D.    Patient did not want to stay and wanted to be discharged because  her mom is coming to get her. The patient is low risk based on Wells Criteria, and PERC negative. The patient has no significant risk for cardiac disease. The patient has been coughing before the pain started. The patient is stable and in no acute distress on exam. The patient is referred back to her PCP. Told to return here as needed for any worsening in her condition.   MDM          Carlyle Dolly, PA-C 10/23/12 [phone]

## 2012-10-20 NOTE — ED Notes (Signed)
Pt states that she woke up this am with midchest pain.  No distress noted.  States that coughing and palpation makes it worse.  Ambulation makes it better.  States that she did have nausea yesterday.

## 2012-10-20 NOTE — ED Notes (Signed)
Patient denies pain and is resting comfortably.  

## 2012-10-20 NOTE — Discharge Instructions (Signed)
Return here as needed. Follow up with your doctor for a recheck. Use heat on your chest wall.

## 2012-10-27 ENCOUNTER — Ambulatory Visit: Payer: Medicare Other

## 2012-10-27 NOTE — ED Provider Notes (Signed)
Medical screening examination/treatment/procedure(s) were performed by non-physician practitioner and as supervising physician I was immediately available for consultation/collaboration.  Arlett Goold R. Evalisse Prajapati, MD 10/27/12 0751 

## 2012-10-29 ENCOUNTER — Encounter (HOSPITAL_COMMUNITY): Payer: Self-pay | Admitting: *Deleted

## 2012-10-29 ENCOUNTER — Emergency Department (HOSPITAL_COMMUNITY)
Admission: EM | Admit: 2012-10-29 | Discharge: 2012-10-30 | Disposition: A | Discharge status: Home / Self Care | Payer: Medicare Other | Attending: Emergency Medicine | Admitting: Emergency Medicine

## 2012-10-29 DIAGNOSIS — Z87440 Personal history of urinary (tract) infections: Secondary | ICD-10-CM | POA: Insufficient documentation

## 2012-10-29 DIAGNOSIS — F411 Generalized anxiety disorder: Secondary | ICD-10-CM | POA: Insufficient documentation

## 2012-10-29 DIAGNOSIS — Z79899 Other long term (current) drug therapy: Secondary | ICD-10-CM | POA: Insufficient documentation

## 2012-10-29 DIAGNOSIS — R45851 Suicidal ideations: Secondary | ICD-10-CM | POA: Insufficient documentation

## 2012-10-29 DIAGNOSIS — F32A Depression, unspecified: Secondary | ICD-10-CM

## 2012-10-29 DIAGNOSIS — F3289 Other specified depressive episodes: Secondary | ICD-10-CM | POA: Insufficient documentation

## 2012-10-29 DIAGNOSIS — F909 Attention-deficit hyperactivity disorder, unspecified type: Secondary | ICD-10-CM | POA: Insufficient documentation

## 2012-10-29 DIAGNOSIS — J45909 Unspecified asthma, uncomplicated: Secondary | ICD-10-CM | POA: Insufficient documentation

## 2012-10-29 DIAGNOSIS — F319 Bipolar disorder, unspecified: Secondary | ICD-10-CM | POA: Insufficient documentation

## 2012-10-29 DIAGNOSIS — F329 Major depressive disorder, single episode, unspecified: Secondary | ICD-10-CM | POA: Insufficient documentation

## 2012-10-29 LAB — URINE MICROSCOPIC-ADD ON

## 2012-10-29 LAB — RAPID URINE DRUG SCREEN, HOSP PERFORMED
Amphetamines: NOT DETECTED
Barbiturates: NOT DETECTED
Benzodiazepines: POSITIVE — AB
Cocaine: NOT DETECTED
Opiates: NOT DETECTED
Tetrahydrocannabinol: NOT DETECTED

## 2012-10-29 LAB — CBC WITH DIFFERENTIAL/PLATELET
Basophils Absolute: 0 10*3/uL (ref 0.0–0.1)
Basophils Relative: 0 % (ref 0–1)
Eosinophils Absolute: 0.1 10*3/uL (ref 0.0–0.7)
Eosinophils Relative: 1 % (ref 0–5)
HCT: 41.7 % (ref 36.0–46.0)
Hemoglobin: 13.9 g/dL (ref 12.0–15.0)
Lymphocytes Relative: 33 % (ref 12–46)
Lymphs Abs: 2.9 10*3/uL (ref 0.7–4.0)
MCH: 31.2 pg (ref 26.0–34.0)
MCHC: 33.3 g/dL (ref 30.0–36.0)
MCV: 93.7 fL (ref 78.0–100.0)
Monocytes Absolute: 0.5 10*3/uL (ref 0.1–1.0)
Monocytes Relative: 5 % (ref 3–12)
Neutro Abs: 5.5 10*3/uL (ref 1.7–7.7)
Neutrophils Relative %: 62 % (ref 43–77)
Platelets: 170 10*3/uL (ref 150–400)
RBC: 4.45 MIL/uL (ref 3.87–5.11)
RDW: 12.5 % (ref 11.5–15.5)
WBC: 9 10*3/uL (ref 4.0–10.5)

## 2012-10-29 LAB — URINALYSIS, ROUTINE W REFLEX MICROSCOPIC
Bilirubin Urine: NEGATIVE
Glucose, UA: NEGATIVE mg/dL
Ketones, ur: NEGATIVE mg/dL
Nitrite: NEGATIVE
Protein, ur: NEGATIVE mg/dL
Specific Gravity, Urine: 1.026 (ref 1.005–1.030)
Urobilinogen, UA: 0.2 mg/dL (ref 0.0–1.0)
pH: 6 (ref 5.0–8.0)

## 2012-10-29 LAB — BASIC METABOLIC PANEL
BUN: 9 mg/dL (ref 6–23)
CO2: 27 mEq/L (ref 19–32)
Calcium: 9.3 mg/dL (ref 8.4–10.5)
Chloride: 104 mEq/L (ref 96–112)
Creatinine, Ser: 0.95 mg/dL (ref 0.50–1.10)
GFR calc Af Amer: >90 mL/min (ref >90)
GFR calc non Af Amer: 83 mL/min — ABNORMAL LOW (ref >90)
Glucose, Bld: 78 mg/dL (ref 70–99)
Potassium: 3.3 mEq/L — ABNORMAL LOW (ref 3.5–5.1)
Sodium: 140 mEq/L (ref 135–145)

## 2012-10-29 LAB — ETHANOL: Alcohol, Ethyl (B): <11 mg/dL (ref 0–11)

## 2012-10-29 LAB — PREGNANCY, URINE: Preg Test, Ur: NEGATIVE

## 2012-10-29 MED ORDER — TRAZODONE HCL 100 MG PO TABS
300.0000 mg | ORAL_TABLET | Freq: Every day | ORAL | Status: DC
  Administered 2012-10-29: 300 mg via ORAL
  Filled 2012-10-29: qty 3

## 2012-10-29 MED ORDER — CITALOPRAM HYDROBROMIDE 20 MG PO TABS
20.0000 mg | ORAL_TABLET | Freq: Every day | ORAL | Status: DC
  Administered 2012-10-30: 20 mg via ORAL
  Filled 2012-10-29: qty 1

## 2012-10-29 MED ORDER — IBUPROFEN 600 MG PO TABS: 600.0000 mg | ORAL_TABLET | Freq: Three times a day (TID) | ORAL | Status: DC | PRN

## 2012-10-29 MED ORDER — ACETAMINOPHEN 325 MG PO TABS: 650.0000 mg | ORAL_TABLET | ORAL | Status: DC | PRN

## 2012-10-29 MED ORDER — LITHIUM CARBONATE 300 MG PO CAPS
300.0000 mg | ORAL_CAPSULE | Freq: Two times a day (BID) | ORAL | Status: DC
  Administered 2012-10-30: 300 mg via ORAL
  Filled 2012-10-29: qty 1

## 2012-10-29 MED ORDER — LORAZEPAM 1 MG PO TABS
1.0000 mg | ORAL_TABLET | Freq: Three times a day (TID) | ORAL | Status: DC | PRN
  Administered 2012-10-30: 1 mg via ORAL
  Filled 2012-10-29: qty 1

## 2012-10-29 NOTE — ED Notes (Signed)
Pt sts shes been having suicidial thoughts x1 week b/c she hasnt been getting along with her boyfriend, mother at bedside

## 2012-10-29 NOTE — ED Notes (Signed)
Family at bedside. 

## 2012-10-29 NOTE — ED Provider Notes (Signed)
History     CSN: [id number]  Arrival date & time 10/29/12  1831   First MD Initiated Contact with Patient 10/29/12 2044      Chief Complaint  Patient presents with  . Medical Clearance    (Consider location/radiation/quality/duration/timing/severity/associated sxs/prior treatment) HPI Comments: Patient with a history of bipolar disorder presents with worsening depression and suicidal ideations. She states that she feels like she would herself but does not have a specific plan. She does have a history of depression psychiatric admissions in the past. She states her symptoms have been worsening over last 2-3 days. She denies any physical complaints. Denies any hallucinations. She denies any substance abuse.   Past Medical History  Diagnosis Date  . Bipolar disorder   . Attention deficit hyperactivity disorder   . Depression   . Anxiety   . Asthma   . UTI (urinary tract infection)     Completed Cipro 08/06/12    Past Surgical History  Procedure Date  . Mouth surgery   . Eye muscle surgery     07/29/12    Family History  Problem Relation Age of Onset  . Depression Mother   . Depression Father   . Depression Brother     History  Substance Use Topics  . Smoking status: Never Smoker   . Smokeless tobacco: Never Used  . Alcohol Use: No    OB History    Grav Para Term Preterm Abortions TAB SAB Ect Mult Living   0 0              Review of Systems  Constitutional: Negative for fever, chills, diaphoresis and fatigue.  HENT: Negative for congestion, rhinorrhea and sneezing.   Eyes: Negative.   Respiratory: Negative for cough, chest tightness and shortness of breath.   Cardiovascular: Negative for chest pain and leg swelling.  Gastrointestinal: Negative for nausea, vomiting, abdominal pain, diarrhea and blood in stool.  Genitourinary: Negative for frequency, hematuria, flank pain and difficulty urinating.  Musculoskeletal: Negative for back pain and arthralgias.    Skin: Negative for rash.  Neurological: Negative for dizziness, speech difficulty, weakness, numbness and headaches.  Psychiatric/Behavioral: Positive for suicidal ideas.    Allergies  Divalproex sodium and Methylphenidate derivatives  Home Medications   Current Outpatient Rx  Name  Route  Sig  Dispense  Refill  . ALPRAZOLAM 0.5 MG PO TABS   Oral   Take 0.5 mg by mouth at bedtime as needed. Anxiety         . ARIPIPRAZOLE 20 MG PO TABS   Oral   Take 1 tablet (20 mg total) by mouth daily.   30 tablet   0   . CITALOPRAM HYDROBROMIDE 20 MG PO TABS   Oral   Take 20 mg by mouth daily. For depression.         [name] FAMOTIDINE 20 MG PO TABS   Oral   Take 1 tablet (20 mg total) by mouth 2 (two) times daily.   30 tablet   0   . GUAIFENESIN ER 1200 MG PO TB12   Oral   Take 1 tablet by mouth 2 (two) times daily.         . IBUPROFEN 800 MG PO TABS   Oral   Take 800 mg by mouth every 8 (eight) hours as needed. Pain         . LITHIUM CARBONATE 300 MG PO CAPS   Oral   Take 1 capsule (300 mg total) by mouth  2 (two) times daily with a meal.   60 capsule   0   . TRAZODONE HCL 300 MG PO TABS   Oral   Take 300 mg by mouth at bedtime. For insomnia.           BP 104/71  Pulse 71  Temp 98 F (36.7 C) (Oral)  Resp 20  SpO2 100%  Physical Exam  Constitutional: She is oriented to person, place, and time. She appears well-developed and well-nourished.  HENT:  Head: Normocephalic and atraumatic.  Eyes: Pupils are equal, round, and reactive to light.  Neck: Normal range of motion. Neck supple.  Cardiovascular: Normal rate, regular rhythm and normal heart sounds.   Pulmonary/Chest: Effort normal and breath sounds normal. No respiratory distress. She has no wheezes. She has no rales. She exhibits no tenderness.  Abdominal: Soft. Bowel sounds are normal. There is no tenderness. There is no rebound and no guarding.  Musculoskeletal: Normal range of motion. She exhibits no  edema.  Lymphadenopathy:    She has no cervical adenopathy.  Neurological: She is alert and oriented to person, place, and time.  Skin: Skin is warm and dry. No rash noted.  Psychiatric: She has a normal mood and affect.    ED Course  Procedures (including critical care time)  Results for orders placed during the hospital encounter of 10/29/12  URINE RAPID DRUG SCREEN (HOSP PERFORMED)      Component Value Range   Opiates NONE DETECTED  NONE DETECTED   Cocaine NONE DETECTED  NONE DETECTED   Benzodiazepines POSITIVE (*) NONE DETECTED   Amphetamines NONE DETECTED  NONE DETECTED   Tetrahydrocannabinol NONE DETECTED  NONE DETECTED   Barbiturates NONE DETECTED  NONE DETECTED  URINALYSIS, ROUTINE W REFLEX MICROSCOPIC      Component Value Range   Color, Urine YELLOW  YELLOW   APPearance CLOUDY (*) CLEAR   Specific Gravity, Urine 1.026  1.005 - 1.030   pH 6.0  5.0 - 8.0   Glucose, UA NEGATIVE  NEGATIVE mg/dL   Hgb urine dipstick LARGE (*) NEGATIVE   Bilirubin Urine NEGATIVE  NEGATIVE   Ketones, ur NEGATIVE  NEGATIVE mg/dL   Protein, ur NEGATIVE  NEGATIVE mg/dL   Urobilinogen, UA 0.2  0.0 - 1.0 mg/dL   Nitrite NEGATIVE  NEGATIVE   Leukocytes, UA SMALL (*) NEGATIVE  URINE MICROSCOPIC-ADD ON      Component Value Range   Squamous Epithelial / LPF MANY (*) RARE   WBC, UA 0-2  <3 WBC/hpf   RBC / HPF 0-2  <3 RBC/hpf   Bacteria, UA MANY (*) RARE  PREGNANCY, URINE      Component Value Range   Preg Test, Ur NEGATIVE  NEGATIVE  CBC WITH DIFFERENTIAL      Component Value Range   WBC 9.0  4.0 - 10.5 K/uL   RBC 4.45  3.87 - 5.11 MIL/uL   Hemoglobin 13.9  12.0 - 15.0 g/dL   HCT 16.1  09.6 - 04.5 %   MCV 93.7  78.0 - 100.0 fL   MCH 31.2  26.0 - 34.0 pg   MCHC 33.3  30.0 - 36.0 g/dL   RDW 40.9  81.1 - 91.4 %   Platelets 170  150 - 400 K/uL   Neutrophils Relative 62  43 - 77 %   Neutro Abs 5.5  1.7 - 7.7 K/uL   Lymphocytes Relative 33  12 - 46 %   Lymphs Abs 2.9  0.7 - 4.0 K/uL  Monocytes Relative 5  3 - 12 %   Monocytes Absolute 0.5  0.1 - 1.0 K/uL   Eosinophils Relative 1  0 - 5 %   Eosinophils Absolute 0.1  0.0 - 0.7 K/uL   Basophils Relative 0  0 - 1 %   Basophils Absolute 0.0  0.0 - 0.1 K/uL  BASIC METABOLIC PANEL      Component Value Range   Sodium 140  135 - 145 mEq/L   Potassium 3.3 (*) 3.5 - 5.1 mEq/L   Chloride 104  96 - 112 mEq/L   CO2 27  19 - 32 mEq/L   Glucose, Bld 78  70 - 99 mg/dL   BUN 9  6 - 23 mg/dL   Creatinine, Ser 1.61  0.50 - 1.10 mg/dL   Calcium 9.3  8.4 - 09.6 mg/dL   GFR calc non Af Amer 83 (*) >90 mL/min   GFR calc Af Amer >90  >90 mL/min  ETHANOL      Component Value Range   Alcohol, Ethyl (B) <11  0 - 11 mg/dL   No results found.   1. Depression       MDM  Pt awaiting ACT consult.        Rolan Bucco, MD 10/29/12 [phone]

## 2012-10-30 LAB — LITHIUM LEVEL: Lithium Lvl: 0.42 mEq/L — ABNORMAL LOW (ref 0.80–1.40)

## 2012-10-30 MED ORDER — CITALOPRAM HYDROBROMIDE 10 MG PO TABS: 10.0000 mg | ORAL_TABLET | Freq: Every day | ORAL | Status: DC

## 2012-10-30 MED ORDER — LITHIUM CARBONATE 300 MG PO TABS: ORAL_TABLET | ORAL | Status: DC

## 2012-10-30 NOTE — ED Notes (Signed)
D/C instructions and prescriptions given with understanding stated. Denies SI, HI and A/VH. Belongings returned and escorted by staff to front of ED.

## 2012-10-30 NOTE — ED Provider Notes (Signed)
Pt has had telepsych consult done by Dr Leretha Pol who recommends she can be discharged after checking a lithium level. If not toxic, increase her lithium to 300 mg qam and 600 mg qpm, increase celexa to 30 mg qam, otherwise continue her other psych meds as prescribed.   Ward Givens, MD 10/30/12 1355

## 2012-10-30 NOTE — ED Notes (Signed)
Lab in to draw

## 2012-10-30 NOTE — ED Notes (Signed)
Consult for tele psych has been faxed and called.

## 2012-10-30 NOTE — ED Notes (Signed)
Up to the desk on the phone 

## 2012-10-30 NOTE — ED Notes (Signed)
Greg CSW into see 

## 2012-10-30 NOTE — ED Notes (Signed)
Up to the bathrooom 
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
## 2012-10-30 NOTE — ED Notes (Signed)
Up to the bathroom 

## 2012-10-30 NOTE — ED Notes (Signed)
CSW met with pt by bedside per request of RN. Pt verbalized to CSW that she desires to receive inpatient treatment at [facility] if possible. CSW explained to pt that CSW/ACT would contact the facility to determine if there is a vacancy per request of pt. CSW explained to pt if there are no beds available ACT/CSW will seek placement at other facilities for treatment. Pt verbalized that if she cannot be placed at [facility] she would prefer [facility]. CSW assured pt that her preferences will be notated.   Janann Colonel., MSW, [facility] Clinical Social Worker [phone]

## 2012-10-30 NOTE — ED Provider Notes (Signed)
LITHIUM LEVEL      Component Value Range   Lithium Lvl 0.42 (*) 0.80 - 1.40 mEq/L   Will discharge pt now.   Ward Givens, MD 10/30/12 [phone]

## 2012-10-30 NOTE — ED Notes (Signed)
Family at bedside. 

## 2012-10-30 NOTE — ED Notes (Signed)
Is requesting to talk with social worker in reference to going to "[facility]" in [place].

## 2012-10-30 NOTE — ED Notes (Signed)
Pt declined at [facility] per Broadus John by MD Domingo Pulse. ACT/CSW will continue to look for alternative placement.  Janann Colonel., MSW, [facility] Clinical Social Worker [phone]

## 2012-10-30 NOTE — ED Notes (Signed)
Dr knapp into see 

## 2012-10-30 NOTE — ED Notes (Signed)
On the phone 

## 2012-10-30 NOTE — Discharge Instructions (Signed)
The psychiatrist who saw you today wants you to increase your lithium to 300 mg in the morning and 600 mg in the evening, she also wants you to increase your Celexa to 30 mg in the morning which would be one 20 mg tablet plus a 10 mg tablet and she wants you to continue all your other medications the way you have been taking them and as they are prescribed. She wants you to take your medicine every day and followup with your mental health counselor. Return to the ED if you feel worse.

## 2012-10-30 NOTE — ED Provider Notes (Signed)
Pt came in for SI after breaking up with boyfriend. States she is "on and off" about feeling SI, is not currently SI but states "that could change".  Has been seen by ACT, will get telepsych consult.   Ward Givens, MD 10/30/12 [phone]

## 2012-11-10 ENCOUNTER — Ambulatory Visit (INDEPENDENT_AMBULATORY_CARE_PROVIDER_SITE_OTHER): Payer: Medicare Other | Admitting: Internal Medicine

## 2012-11-10 VITALS — BP 108/72 | HR 79 | Temp 97.7°F | Ht 66.0 in | Wt 233.1 lb

## 2012-11-10 DIAGNOSIS — B9789 Other viral agents as the cause of diseases classified elsewhere: Secondary | ICD-10-CM

## 2012-11-10 DIAGNOSIS — K921 Melena: Secondary | ICD-10-CM

## 2012-11-10 DIAGNOSIS — B349 Viral infection, unspecified: Secondary | ICD-10-CM

## 2012-11-10 DIAGNOSIS — Z8719 Personal history of other diseases of the digestive system: Secondary | ICD-10-CM

## 2012-11-10 LAB — POC HEMOCCULT BLD/STL (OFFICE/1-CARD/DIAGNOSTIC)
Fecal Occult Blood, POC: NEGATIVE
Occult Blood, Stool #2: NEGATIVE
Occult Blood, Stool #3: NEGATIVE

## 2012-11-10 MED ORDER — BISMUTH SUBSALICYLATE 262 MG/15ML PO SUSP: 15.0000 mL | Freq: Four times a day (QID) | ORAL | Status: DC | PRN

## 2012-11-10 MED ORDER — GUAIFENESIN 100 MG/5ML PO LIQD: 200.0000 mg | Freq: Three times a day (TID) | ORAL | Status: DC | PRN

## 2012-11-10 MED ORDER — ACETAMINOPHEN 325 MG PO TABS: 650.0000 mg | ORAL_TABLET | ORAL | Status: DC | PRN

## 2012-11-10 NOTE — Progress Notes (Signed)
  Subjective:    Patient ID: [name], female    DOB: 1986/01/30, 26 y.o.   MRN: [id number]  HPI  Please see the A&P for the status of the pt's chronic medical problems.   Review of Systems  Constitutional: Negative for activity change and appetite change.  HENT: Positive for congestion, sore throat, postnasal drip and sinus pressure.   Respiratory: Positive for cough, chest tightness and shortness of breath.   Cardiovascular: Negative for chest pain.  Gastrointestinal: Positive for nausea, vomiting, abdominal pain and diarrhea.  Neurological: Positive for headaches.  Psychiatric/Behavioral: Negative for sleep disturbance.       Objective:   Physical Exam  Constitutional: She is oriented to person, place, and time. She appears well-developed and well-nourished. No distress.  HENT:  Head: Normocephalic and atraumatic.  Right Ear: External ear normal.  Left Ear: External ear normal.  Nose: Nose normal.  Mouth/Throat: Mucous membranes are not pale and not dry. Posterior oropharyngeal erythema present. No oropharyngeal exudate, posterior oropharyngeal edema or tonsillar abscesses.  Eyes: Conjunctivae normal and EOM are normal.  Cardiovascular: Normal rate, regular rhythm and normal heart sounds.   Pulmonary/Chest: Effort normal and breath sounds normal. No respiratory distress. She has no wheezes. She has no rales. She exhibits no tenderness.  Abdominal: Soft. Bowel sounds are normal. There is no tenderness.  Musculoskeletal: Normal range of motion.  Neurological: She is alert and oriented to person, place, and time.  Skin: Skin is warm and dry. She is not diaphoretic.  Psychiatric: Her behavior is normal. Judgment and thought content normal. Her mood appears anxious. Her affect is blunt. Her affect is not angry. Her speech is not rapid and/or pressured, not delayed, not tangential and not slurred. She is not slowed and not actively hallucinating. Thought content is not  delusional. Cognition and memory are impaired. She is communicative.       She is concerned that she needs to be admitted but not delusional or insisting. She answers "yes" to "does this hurt" before I even touch her. HAs trouble remembering details of her sxs from past 72 hrs.   She is attentive.          Assessment & Plan:

## 2012-11-10 NOTE — Assessment & Plan Note (Signed)
She returned the stool cards that had been ordered when presented earlier with blood in stools. All were negative but 2 of the three were too old. I reviewed Dr Eliane Decree note and I saw no indication to repeat the study.

## 2012-11-10 NOTE — Progress Notes (Signed)
Subjective:     Patient ID: [name], female   DOB: [birth year], 26 y.o.   MRN: [id number]  HPI 26 year old female with a PMH of bipolar disorder presents to the clinic with complaints of cough, vomiting, headache, diarrhea, and congestion for the past 1 week. The cough is non productive, associated with chest pain, and has not taken any mediation to alleviate the cough. She has had 2 episodes of diarrhea of which 1 was visibly bloody. Vomiting has been non bloody, not associated with meals, and has occurred 3 or 4 times in the past week. She believes her mother and brother are also sick with the same symptoms. No alleviating or aggravating factors revealed. No fever, some shortness of breath, no rashes. The patient wanted to be admitted for her symptoms.   Review of Systems  Constitutional: Positive for chills and fatigue. Negative for fever, appetite change and unexpected weight change.  HENT: Positive for congestion and rhinorrhea. Negative for neck pain and neck stiffness.   Eyes: Negative.   Respiratory: Positive for cough and shortness of breath. Negative for choking and wheezing.   Cardiovascular: Negative.   Gastrointestinal: Positive for nausea, vomiting, diarrhea and blood in stool. Negative for abdominal pain and constipation.  Genitourinary: Negative.   Musculoskeletal: Negative.   Skin: Negative.   Neurological: Positive for dizziness. Negative for syncope.  Psychiatric/Behavioral: Negative.       Objective:   Physical Exam  Constitutional: She is oriented to person, place, and time. She appears well-developed and well-nourished. No distress.  HENT:  Head: Normocephalic.  Right Ear: External ear normal.  Left Ear: External ear normal.  Nose: Nose normal.  Mouth/Throat: Oropharynx is clear and moist.  Eyes: Right eye exhibits no discharge. Left eye exhibits no discharge. Left pupil is not reactive.       Dilated bilaterally  Cardiovascular: Normal rate, regular  rhythm and normal heart sounds.  Exam reveals no gallop and no friction rub.   No murmur heard. Pulmonary/Chest: Effort normal. No respiratory distress. She has no wheezes. She has no rales.  Abdominal: Soft. She exhibits no distension. There is no tenderness. There is no rebound.  Musculoskeletal: Normal range of motion. She exhibits no edema and no tenderness.  Neurological: She is alert and oriented to person, place, and time. She has normal reflexes.  Skin: She is not diaphoretic.  Psychiatric: She has a normal mood and affect.       Assessment/Plan:     26 year old female presents with complaints of cough, headache, congestion, vomiting, diarrhea, and chest pain with coughing. The symptoms have not been improving over the past week. Patient has been able to eat and sleep without problems. Explained to the patient that she does not need to be admitted, and will likely feel better after 2 weeks with rest and over the counter medications. 1. Upper respiratory infection. Likely viral etiology.  - Cough/Congestion: Robitussin and plenty of fluids/rest - Headache: Tylenol - Vomiting/Diarrhea: Pepto-Bismol  If symptoms don't resolve within 2 weeks, please call the clinic to make a follow up appointment.

## 2012-11-10 NOTE — Patient Instructions (Addendum)
Please take over the counter medications as directed. The Pepto Bismol may make your tongue and stool black, but this is a known side-effect of the medication. Do not worry about this color change. Your tongue will return to its normal color once you stop taking the medication. The stool will not be black after you stop taking the medication.   Drink plenty of fluids daily and rest your body!   If the symptoms last for more than 2 weeks, please come back to the clinic to see [place].   Have a great Thanksgiving! It was a pleasure seeing you today at the outpatient clinic.    Upper Respiratory Infection, Adult An upper respiratory infection (URI) is also known as the common cold. It is often caused by a type of germ (virus). Colds are easily spread (contagious). You can pass it to others by kissing, coughing, sneezing, or drinking out of the same glass. Usually, you get better in 1 or 2 weeks.  HOME CARE   Only take medicine as told by your doctor.  Use a warm mist humidifier or breathe in steam from a hot shower.  Drink enough water and fluids to keep your pee (urine) clear or pale yellow.  Get plenty of rest.  Return to work when your temperature is back to normal or as told by your doctor. You may use a face mask and wash your hands to stop your cold from spreading. GET HELP RIGHT AWAY IF:   After the first few days, you feel you are getting worse.  You have questions about your medicine.  You have chills, shortness of breath, or brown or red spit (mucus).  You have yellow or brown snot (nasal discharge) or pain in the face, especially when you bend forward.  You have a fever, puffy (swollen) neck, pain when you swallow, or white spots in the back of your throat.  You have a bad headache, ear pain, sinus pain, or chest pain.  You have a high-pitched whistling sound when you breathe in and out (wheezing).  You have a lasting cough or cough up blood.  You have sore muscles or a  stiff neck. MAKE SURE YOU:   Understand these instructions.  Will watch your condition.  Will get help right away if you are not doing well or get worse. Document Released: 05/19/2008 Document Revised: 02/23/2012 Document Reviewed: 04/07/2011 [facility] Patient Information 2013 [place], [place].

## 2012-11-10 NOTE — Assessment & Plan Note (Addendum)
She is accompanied by her mom but her mom also needed to be seen for an acute visit so I spoke to the pt without her mom present. About one week of non productive cough that is assoc with chest pain, sore throat, congestion, HA, V, D, and trouble breathing. She believes that she is getting worse. She was able to eat a cheeseburger and fries today recently without and N/V/D. She has had no D today. She had two episodes of D yesterday. She is sleeping OK. She wants to be admitted bc she is concerned about her breathing. She is able to speak in full sentences. Her lungs are clear with GAM. Her O2 sat was normal. Her brother is also ill and her mom needed to be seen for lightheadedness and anorexia today.   I agree that she has a viral illness that is causing URI sxs and GI sxs. She needs reassurance and symptomatic tx - tylenol, pepto, robitussin. She can return to clinic if no better.

## 2012-11-14 ENCOUNTER — Emergency Department (HOSPITAL_COMMUNITY)
Admission: EM | Admit: 2012-11-14 | Discharge: 2012-11-14 | Disposition: A | Discharge status: Home / Self Care | Payer: Medicare Other | Attending: Emergency Medicine | Admitting: Emergency Medicine

## 2012-11-14 ENCOUNTER — Encounter (HOSPITAL_COMMUNITY): Payer: Self-pay | Admitting: Emergency Medicine

## 2012-11-14 DIAGNOSIS — Z87440 Personal history of urinary (tract) infections: Secondary | ICD-10-CM | POA: Insufficient documentation

## 2012-11-14 DIAGNOSIS — R071 Chest pain on breathing: Secondary | ICD-10-CM | POA: Insufficient documentation

## 2012-11-14 DIAGNOSIS — F411 Generalized anxiety disorder: Secondary | ICD-10-CM | POA: Insufficient documentation

## 2012-11-14 DIAGNOSIS — R0789 Other chest pain: Secondary | ICD-10-CM

## 2012-11-14 DIAGNOSIS — F909 Attention-deficit hyperactivity disorder, unspecified type: Secondary | ICD-10-CM | POA: Insufficient documentation

## 2012-11-14 DIAGNOSIS — Z79899 Other long term (current) drug therapy: Secondary | ICD-10-CM | POA: Insufficient documentation

## 2012-11-14 DIAGNOSIS — J45909 Unspecified asthma, uncomplicated: Secondary | ICD-10-CM | POA: Insufficient documentation

## 2012-11-14 DIAGNOSIS — F319 Bipolar disorder, unspecified: Secondary | ICD-10-CM | POA: Insufficient documentation

## 2012-11-14 MED ORDER — GI COCKTAIL ~~LOC~~
30.0000 mL | Freq: Once | ORAL | Status: DC
  Filled 2012-11-14: qty 30

## 2012-11-14 MED ORDER — IBUPROFEN 800 MG PO TABS
800.0000 mg | ORAL_TABLET | Freq: Once | ORAL | Status: AC
  Administered 2012-11-14: 800 mg via ORAL
  Filled 2012-11-14: qty 1

## 2012-11-14 MED ORDER — ALUM & MAG HYDROXIDE-SIMETH 200-200-20 MG/5ML PO SUSP
30.0000 mL | Freq: Once | ORAL | Status: AC
  Administered 2012-11-14: 30 mL via ORAL
  Filled 2012-11-14: qty 30

## 2012-11-14 NOTE — ED Notes (Signed)
Took GI cocktail to room while EDP at bedside.  Pt asked if it was pink and states that she has had it before.  EDP offered for her to take Mylanta instead if she wanted and she chose Mylanta.

## 2012-11-14 NOTE — ED Notes (Signed)
EDP notified of pt's request for discharge papers.

## 2012-11-14 NOTE — Discharge Instructions (Signed)
Chest Pain, Nonspecific  It is often hard to give a specific diagnosis for the cause of chest pain. There is always a chance that your pain could be related to something serious, like a heart attack or a blood clot in the lungs. You need to follow up with your caregiver for further evaluation. More lab tests or other studies such as X-rays, electrocardiography, stress testing, or cardiac imaging may be needed to find the cause of your pain.  Most of the time, nonspecific chest pain improves within 2 to 3 days with rest and mild pain medicine. For the next few days, avoid physical exertion or activities that bring on pain. Do not smoke. Avoid drinking alcohol. Call your caregiver for routine follow-up as advised.   SEEK IMMEDIATE MEDICAL CARE IF:  · You develop increased chest pain or pain that radiates to the arm, neck, jaw, back, or abdomen.  · You develop shortness of breath, increased coughing, or you start coughing up blood.  · You have severe back or abdominal pain, nausea, or vomiting.  · You develop severe weakness, fainting, fever, or chills.  Document Released: 12/01/2005 Document Revised: 02/23/2012 Document Reviewed: 05/21/2007  [employer]® Patient Information ©2013 [employer].

## 2012-11-14 NOTE — ED Notes (Signed)
Pt given Mylanta as ordered and states that she needs chips to eat with it.  Informed pt that we did not have chips and that we needed to wait to see if her pain improved with the medication prior to eating anything.  Pt states that she does not need ibuprofen.  Explained to pt that EDP ordered it but that it was up to her if she wanted to take it.  Pt took medication and states that she is ready for her discharge paperwork and is feeling better.

## 2012-11-14 NOTE — ED Provider Notes (Signed)
History     CSN: [id number]  Arrival date & time 11/14/12  1545   First MD Initiated Contact with Patient 11/14/12 1556      Chief Complaint  Patient presents with  . Heartburn    (Consider location/radiation/quality/duration/timing/severity/associated sxs/prior treatment) HPIBobbie R Branum is a 26 y.o. female history of multiple psychiatric issues as well as asthma who presents with chest pain. She says her chest pain is sharp, intermittent, lasts less than one second it started about an hour ago after she ate a bowl of chili with "spices and stuff", it is 7.5/10 in intensity and does not radiate says she's had occasional issues with stomach acid problems in the past and has taken things like Pepto-Bismol. If not associated with diaphoresis, shortness of breath, abdominal pain, dizziness or lightheadedness. She's been otherwise feeling well and has no history cardiovascular or pulmonary disease.    Past Medical History  Diagnosis Date  . Bipolar disorder   . Attention deficit hyperactivity disorder   . Depression   . Anxiety   . Asthma   . UTI (urinary tract infection)     Completed Cipro 08/06/12    Past Surgical History  Procedure Date  . Mouth surgery   . Eye muscle surgery     07/29/12    Family History  Problem Relation Age of Onset  . Depression Mother   . Depression Father   . Depression Brother     History  Substance Use Topics  . Smoking status: Never Smoker   . Smokeless tobacco: Never Used  . Alcohol Use: No    OB History    Grav Para Term Preterm Abortions TAB SAB Ect Mult Living   0 0              Review of Systems At least 10pt or greater review of systems completed and are negative except where specified in the HPI.  Allergies  Divalproex sodium and Methylphenidate derivatives  Home Medications   Current Outpatient Rx  Name  Route  Sig  Dispense  Refill  . ALPRAZOLAM 0.5 MG PO TABS   Oral   Take 0.5 mg by mouth at bedtime as  needed. Anxiety         . ARIPIPRAZOLE 30 MG PO TABS   Oral   Take 30 mg by mouth daily.         [name] BISMUTH SUBSALICYLATE 262 MG/15ML PO SUSP   Oral   Take 15 mLs by mouth every 6 (six) hours as needed for indigestion.   360 mL   0   . CARBAMAZEPINE 200 MG PO TABS   Oral   Take 200 mg by mouth 2 (two) times daily. 400 mg at bed and 200 mg in the morning         . CITALOPRAM HYDROBROMIDE 10 MG PO TABS   Oral   Take 30 mg by mouth daily.         [name] FAMOTIDINE 20 MG PO TABS   Oral   Take 1 tablet (20 mg total) by mouth 2 (two) times daily.   30 tablet   0   . GUAIFENESIN 100 MG/5ML PO LIQD   Oral   Take 10 mLs (200 mg total) by mouth 3 (three) times daily as needed for cough.   120 mL   0   . GUAIFENESIN ER 1200 MG PO TB12   Oral   Take 1 tablet by mouth 2 (two) times daily.         [name]  IBUPROFEN 800 MG PO TABS   Oral   Take 800 mg by mouth every 8 (eight) hours as needed. Pain         . TRAZODONE HCL 300 MG PO TABS   Oral   Take 300 mg by mouth at bedtime. For insomnia.           BP 99/67  Pulse 62  Temp 98.1 F (36.7 C) (Oral)  Resp 16  SpO2 97%  Physical Exam  Nursing notes reviewed.  Electronic medical record reviewed. VITAL SIGNS:   Filed Vitals:   11/14/12 1559 11/14/12 1615 11/14/12 1630  BP: 103/51  99/67  Pulse: 72  62  Temp: 98.1 F (36.7 C)    TempSrc: Oral    Resp: 22  16  SpO2: 96% 97%    CONSTITUTIONAL: Awake, oriented, appears non-toxic HENT: Atraumatic, normocephalic, oral mucosa pink and moist, airway patent. Nares patent without drainage. External ears normal. EYES: Conjunctiva clear, EOMI, PERRLA NECK: Trachea midline, non-tender, supple CARDIOVASCULAR: Normal heart rate, Normal rhythm, No murmurs, rubs, gallops PULMONARY/CHEST: Clear to auscultation, no rhonchi, wheezes, or rales. Symmetrical breath sounds. Left upper side of the chest wall is tender to palpation from approximately the second rib to the clavicle on the  left ABDOMINAL: Non-distended, obese, soft, non-tender - no rebound or guarding.  BS normal. NEUROLOGIC: Non-focal, moving all four extremities, no gross sensory or motor deficits. EXTREMITIES: No clubbing, cyanosis, or edema SKIN: Warm, Dry, No erythema, No rash  ED Course  Procedures (including critical care time)  Labs Reviewed - No data to display No results found.   1. Chest wall pain       MDM  SAWYER KAHAN is a 26 y.o. female Presenting with atypical chest pain is reproducible on physical exam. Patient is very odd in this encounter specifying that her pain is 7 point 5/10, describes getting EKG and she says "can I refuse the EKG?" Council's described treatment plan for her which involves Mylanta and ibuprofen to which she said "I refuse the ibuprofen since I had some at home?"   Patient took Mylanta and then promptly asked nurse for some chips to go with her Mylanta.    Based on the patient's benign appearance, low risk profile being a 26 year old female without known cardiac disease or any other significant intrathoracic pathology, we'll discharge the patient home stable and in good condition. She is urged to use TUMS/Mylanta or any other over-the-counter acid reducing products following meals that causes her discomfort. If she has chest wall pain she can use ibuprofen as needed.  I explained the diagnosis and have given explicit precautions to return to the ER including chest pain or shortness of breath or any other new or worsening symptoms. The patient understands and accepts the medical plan as it's been dictated and I have answered their questions. Discharge instructions concerning home care and prescriptions have been given.  The patient is STABLE and is discharged to home in good condition.       Jones Skene, MD 11/14/12 1656

## 2012-11-14 NOTE — ED Notes (Signed)
Report from [facility].  Pt c/o heartburn after eating chili today.

## 2012-11-21 ENCOUNTER — Encounter (HOSPITAL_COMMUNITY): Payer: Self-pay | Admitting: Emergency Medicine

## 2012-11-21 ENCOUNTER — Emergency Department (HOSPITAL_COMMUNITY)
Admission: EM | Admit: 2012-11-21 | Discharge: 2012-11-22 | Disposition: A | Discharge status: Home / Self Care | Payer: Medicare Other | Attending: Emergency Medicine | Admitting: Emergency Medicine

## 2012-11-21 DIAGNOSIS — Z87440 Personal history of urinary (tract) infections: Secondary | ICD-10-CM | POA: Insufficient documentation

## 2012-11-21 DIAGNOSIS — F411 Generalized anxiety disorder: Secondary | ICD-10-CM | POA: Insufficient documentation

## 2012-11-21 DIAGNOSIS — Z79899 Other long term (current) drug therapy: Secondary | ICD-10-CM | POA: Insufficient documentation

## 2012-11-21 DIAGNOSIS — F909 Attention-deficit hyperactivity disorder, unspecified type: Secondary | ICD-10-CM | POA: Insufficient documentation

## 2012-11-21 DIAGNOSIS — R45851 Suicidal ideations: Secondary | ICD-10-CM

## 2012-11-21 DIAGNOSIS — T50901A Poisoning by unspecified drugs, medicaments and biological substances, accidental (unintentional), initial encounter: Secondary | ICD-10-CM

## 2012-11-21 DIAGNOSIS — J45909 Unspecified asthma, uncomplicated: Secondary | ICD-10-CM | POA: Insufficient documentation

## 2012-11-21 DIAGNOSIS — F3289 Other specified depressive episodes: Secondary | ICD-10-CM | POA: Insufficient documentation

## 2012-11-21 DIAGNOSIS — F319 Bipolar disorder, unspecified: Secondary | ICD-10-CM | POA: Insufficient documentation

## 2012-11-21 DIAGNOSIS — Z3202 Encounter for pregnancy test, result negative: Secondary | ICD-10-CM | POA: Insufficient documentation

## 2012-11-21 DIAGNOSIS — F329 Major depressive disorder, single episode, unspecified: Secondary | ICD-10-CM | POA: Insufficient documentation

## 2012-11-21 DIAGNOSIS — T43502A Poisoning by unspecified antipsychotics and neuroleptics, intentional self-harm, initial encounter: Secondary | ICD-10-CM | POA: Insufficient documentation

## 2012-11-21 DIAGNOSIS — T43294A Poisoning by other antidepressants, undetermined, initial encounter: Secondary | ICD-10-CM | POA: Insufficient documentation

## 2012-11-21 LAB — SALICYLATE LEVEL: Salicylate Lvl: <2 mg/dL — ABNORMAL LOW (ref 2.8–20.0)

## 2012-11-21 LAB — COMPREHENSIVE METABOLIC PANEL
ALT: 11 U/L (ref 0–35)
AST: 14 U/L (ref 0–37)
Albumin: 3.6 g/dL (ref 3.5–5.2)
Alkaline Phosphatase: 77 U/L (ref 39–117)
BUN: 12 mg/dL (ref 6–23)
CO2: 31 mEq/L (ref 19–32)
Calcium: 9.2 mg/dL (ref 8.4–10.5)
Chloride: 100 mEq/L (ref 96–112)
Creatinine, Ser: 0.92 mg/dL (ref 0.50–1.10)
GFR calc Af Amer: >90 mL/min (ref >90)
GFR calc non Af Amer: 86 mL/min — ABNORMAL LOW (ref >90)
Glucose, Bld: 87 mg/dL (ref 70–99)
Potassium: 4.3 mEq/L (ref 3.5–5.1)
Sodium: 137 mEq/L (ref 135–145)
Total Bilirubin: 0.1 mg/dL — ABNORMAL LOW (ref 0.3–1.2)
Total Protein: 6.9 g/dL (ref 6.0–8.3)

## 2012-11-21 LAB — URINALYSIS, ROUTINE W REFLEX MICROSCOPIC
Bilirubin Urine: NEGATIVE
Glucose, UA: NEGATIVE mg/dL
Hgb urine dipstick: NEGATIVE
Ketones, ur: NEGATIVE mg/dL
Leukocytes, UA: NEGATIVE
Nitrite: NEGATIVE
Protein, ur: NEGATIVE mg/dL
Specific Gravity, Urine: 1.026 (ref 1.005–1.030)
Urobilinogen, UA: 0.2 mg/dL (ref 0.0–1.0)
pH: 6 (ref 5.0–8.0)

## 2012-11-21 LAB — CBC WITH DIFFERENTIAL/PLATELET
Basophils Absolute: 0 10*3/uL (ref 0.0–0.1)
Basophils Relative: 0 % (ref 0–1)
Eosinophils Absolute: 0 10*3/uL (ref 0.0–0.7)
Eosinophils Relative: 1 % (ref 0–5)
HCT: 41.5 % (ref 36.0–46.0)
Hemoglobin: 13.7 g/dL (ref 12.0–15.0)
Lymphocytes Relative: 33 % (ref 12–46)
Lymphs Abs: 2.1 10*3/uL (ref 0.7–4.0)
MCH: 30.8 pg (ref 26.0–34.0)
MCHC: 33 g/dL (ref 30.0–36.0)
MCV: 93.3 fL (ref 78.0–100.0)
Monocytes Absolute: 0.3 10*3/uL (ref 0.1–1.0)
Monocytes Relative: 4 % (ref 3–12)
Neutro Abs: 3.9 10*3/uL (ref 1.7–7.7)
Neutrophils Relative %: 62 % (ref 43–77)
Platelets: 184 10*3/uL (ref 150–400)
RBC: 4.45 MIL/uL (ref 3.87–5.11)
RDW: 12 % (ref 11.5–15.5)
WBC: 6.3 10*3/uL (ref 4.0–10.5)

## 2012-11-21 LAB — RAPID URINE DRUG SCREEN, HOSP PERFORMED
Amphetamines: NOT DETECTED
Barbiturates: NOT DETECTED
Benzodiazepines: POSITIVE — AB
Cocaine: NOT DETECTED
Opiates: NOT DETECTED
Tetrahydrocannabinol: NOT DETECTED

## 2012-11-21 LAB — ACETAMINOPHEN LEVEL: Acetaminophen (Tylenol), Serum: <15 ug/mL (ref 10–30)

## 2012-11-21 LAB — PREGNANCY, URINE: Preg Test, Ur: NEGATIVE

## 2012-11-21 LAB — ETHANOL: Alcohol, Ethyl (B): <11 mg/dL (ref 0–11)

## 2012-11-21 MED ORDER — CARBAMAZEPINE 200 MG PO TABS: 200.0000 mg | ORAL_TABLET | Freq: Every day | ORAL | Status: DC

## 2012-11-21 MED ORDER — TRAZODONE HCL 100 MG PO TABS: 300.0000 mg | ORAL_TABLET | Freq: Every day | ORAL | Status: DC

## 2012-11-21 MED ORDER — BISMUTH SUBSALICYLATE 262 MG/15ML PO SUSP
15.0000 mL | Freq: Four times a day (QID) | ORAL | Status: DC | PRN
  Filled 2012-11-21: qty 236

## 2012-11-21 MED ORDER — ALPRAZOLAM 0.5 MG PO TABS: 0.5000 mg | ORAL_TABLET | Freq: Every evening | ORAL | Status: DC | PRN

## 2012-11-21 MED ORDER — IBUPROFEN 200 MG PO TABS: 600.0000 mg | ORAL_TABLET | Freq: Three times a day (TID) | ORAL | Status: DC | PRN

## 2012-11-21 MED ORDER — LORAZEPAM 1 MG PO TABS: 1.0000 mg | ORAL_TABLET | Freq: Three times a day (TID) | ORAL | Status: DC | PRN

## 2012-11-21 MED ORDER — CARBAMAZEPINE 200 MG PO TABS
400.0000 mg | ORAL_TABLET | Freq: Every day | ORAL | Status: DC
  Filled 2012-11-21: qty 2

## 2012-11-21 MED ORDER — ACETAMINOPHEN 325 MG PO TABS: 650.0000 mg | ORAL_TABLET | ORAL | Status: DC | PRN

## 2012-11-21 NOTE — ED Provider Notes (Signed)
History     CSN: [id number]  Arrival date & time 11/21/12  2031   First MD Initiated Contact with Patient 11/21/12 2044      Chief Complaint  Patient presents with  . Drug Overdose    (Consider location/radiation/quality/duration/timing/severity/associated sxs/prior treatment) The history is provided by the patient.  [name] is a 26 y.o. female hx of bipolar, ADHD, anxiety here with suicidal attempt. She went to [facility] this AM and told them she was going to commit suicide. She was sent home. Around 7:30pm tonight, she took 20 20mg  paxil pills (Mother's prescription). She said that she felt depressed. Her grandmother died 10 years ago around the holidays and she gets depressed around this time of year. She had previous suicide attempt with cutting and with aspirin overdose. She denies taking any other drugs or drinking alcohol today. She said that she wanted to kill herself but denies homicidal ideations or hallucinations.    Past Medical History  Diagnosis Date  . Bipolar disorder   . Attention deficit hyperactivity disorder   . Depression   . Anxiety   . Asthma   . UTI (urinary tract infection)     Completed Cipro 08/06/12    Past Surgical History  Procedure Date  . Mouth surgery   . Eye muscle surgery     07/29/12    Family History  Problem Relation Age of Onset  . Depression Mother   . Depression Father   . Depression Brother     History  Substance Use Topics  . Smoking status: Never Smoker   . Smokeless tobacco: Never Used  . Alcohol Use: No    OB History    Grav Para Term Preterm Abortions TAB SAB Ect Mult Living   0 0              Review of Systems  Psychiatric/Behavioral: Positive for suicidal ideas.  All other systems reviewed and are negative.    Allergies  Divalproex sodium and Methylphenidate derivatives  Home Medications   Current Outpatient Rx  Name  Route  Sig  Dispense  Refill  . ALPRAZOLAM 0.5 MG PO TABS   Oral   Take  0.5 mg by mouth at bedtime as needed. Anxiety         . ARIPIPRAZOLE 30 MG PO TABS   Oral   Take 30 mg by mouth daily.         [name] CARBAMAZEPINE 200 MG PO TABS   Oral   Take 200-400 mg by mouth 2 (two) times daily. Take 1 tablet (200mg ) in the morning Take 2 tablets (400mg ) at bedtime         . CITALOPRAM HYDROBROMIDE 10 MG PO TABS   Oral   Take 10 mg by mouth daily. Take along with 20mg  tablet to make a total of 30mg .         [name] CITALOPRAM HYDROBROMIDE 20 MG PO TABS   Oral   Take 20 mg by mouth daily. Take along with 10mg  to make a total of 30mg .         . FAMOTIDINE 20 MG PO TABS   Oral   Take 1 tablet (20 mg total) by mouth 2 (two) times daily.   30 tablet   0   . TRAZODONE HCL 300 MG PO TABS   Oral   Take 300 mg by mouth at bedtime. For insomnia.         [name] BISMUTH SUBSALICYLATE 262 MG/15ML PO  SUSP   Oral   Take 15 mLs by mouth every 6 (six) hours as needed for indigestion.   360 mL   0   . GUAIFENESIN 100 MG/5ML PO LIQD   Oral   Take 10 mLs (200 mg total) by mouth 3 (three) times daily as needed for cough.   120 mL   0   . IBUPROFEN 800 MG PO TABS   Oral   Take 800 mg by mouth every 8 (eight) hours as needed. Pain           BP 114/70  Pulse 71  Temp 98.7 F (37.1 C) (Oral)  Resp 16  SpO2 98%  Physical Exam  Nursing note and vitals reviewed. Constitutional: She is oriented to person, place, and time. She appears well-developed and well-nourished.       Depressed  HENT:  Head: Normocephalic.  Mouth/Throat: Oropharynx is clear and moist.  Eyes: Conjunctivae normal are normal. Pupils are equal, round, and reactive to light.  Neck: Normal range of motion. Neck supple.  Cardiovascular: Normal rate, regular rhythm and normal heart sounds.   Pulmonary/Chest: Effort normal and breath sounds normal. No respiratory distress. She has no wheezes. She has no rales.  Abdominal: Soft. Bowel sounds are normal. She exhibits no distension. There is no  tenderness. There is no rebound and no guarding.  Musculoskeletal: Normal range of motion. She exhibits no edema and no tenderness.  Neurological: She is alert and oriented to person, place, and time.  Skin: Skin is warm and dry.  Psychiatric:       Depressed, poor judgment     ED Course  Procedures (including critical care time)  Labs Reviewed  COMPREHENSIVE METABOLIC PANEL - Abnormal; Notable for the following:    Total Bilirubin 0.1 (*)     GFR calc non Af Amer 86 (*)     All other components within normal limits  SALICYLATE LEVEL - Abnormal; Notable for the following:    Salicylate Lvl <2.0 (*)     All other components within normal limits  URINALYSIS, ROUTINE W REFLEX MICROSCOPIC - Abnormal; Notable for the following:    APPearance CLOUDY (*)     All other components within normal limits  URINE RAPID DRUG SCREEN (HOSP PERFORMED) - Abnormal; Notable for the following:    Benzodiazepines POSITIVE (*)     All other components within normal limits  CBC WITH DIFFERENTIAL  ACETAMINOPHEN LEVEL  ETHANOL  PREGNANCY, URINE   No results found.   No diagnosis found.   Date: 11/21/2012  Rate: 60  Rhythm: normal sinus rhythm  QRS Axis: normal  Intervals: normal  ST/T Wave abnormalities: normal  Conduction Disutrbances:none  Narrative Interpretation: QT not prolonged   Old EKG Reviewed: unchanged    MDM  [name] is a 26 y.o. female here with suicidal attempt. She is suicidal. Will get labs, ACT and psych consult.   10:29 PM Nurse called poison control, who recommend that patient be on tele monitor for 6 hrs from the ingestion to look for QT prolongation. Tox + benzos (patient on xanax), otherwise neg. Will observe until 1AM and then transfer to [facility]. Telepsych will see patient.        Richardean Canal, MD 11/21/12 2230

## 2012-11-21 NOTE — ED Notes (Addendum)
Poison Control notified of OD. Symptoms may be delayed because this releases slowly. S/S may include tachycardia, nausea/vomiting, tremors, QRS prolongation, CNS depression, SIADH, hyponatremia, extrapyramidal effects. Recommend continuous cardiac monitoring for a minimum of 6.5h.

## 2012-11-21 NOTE — ED Notes (Signed)
Per EMS, pt went to [facility] this morning and told them she was going to commit suicide. They discharged her home. She proceeded to take 20 - 20 mg tablets of her Mother's Paxil (self-report). Pt has hx of cutting and previous suicide attempt. When asked why she took the pills, pt told EMS, "I want to kill myself."

## 2012-11-21 NOTE — ED Notes (Signed)
MD notified of Poison Control recommendations. Ingestion time was 1900. Will keep her and monitor until 0130. Repeat EKG at 0100 and hold meds until she is cleared.

## 2012-11-21 NOTE — ED Notes (Signed)
Bed:RESB<BR> Expected date:<BR> Expected time:<BR> Means of arrival:<BR> Comments:<BR>

## 2012-11-21 NOTE — ED Notes (Signed)
House coverage notified of SI

## 2012-11-22 NOTE — ED Notes (Signed)
Pt is changing back into her regular clothes.

## 2012-11-22 NOTE — ED Notes (Signed)
Spoke with [employer]. Provided update.

## 2012-11-22 NOTE — ED Provider Notes (Signed)
EKG Interpretation:  Date & Time: 11/22/2012 12:47 AM  Rate: 66  Rhythm: normal sinus rhythm  QRS Axis: right  Intervals: normal  ST/T Wave abnormalities: nonspecific T wave changes  Conduction Disutrbances:none  Narrative Interpretation:   Old EKG Reviewed: unchanged  Dr. Jacky Kindle (telepsychiatrist) deems patient safe for discharge.      Hanley Seamen, MD 11/22/12 [phone]

## 2012-11-22 NOTE — Discharge Instructions (Signed)

## 2012-11-23 ENCOUNTER — Encounter (HOSPITAL_COMMUNITY): Payer: Self-pay | Admitting: *Deleted

## 2012-11-23 ENCOUNTER — Emergency Department (INDEPENDENT_AMBULATORY_CARE_PROVIDER_SITE_OTHER)
Admission: EM | Admit: 2012-11-23 | Discharge: 2012-11-23 | Disposition: A | Discharge status: Home / Self Care | Payer: Medicare Other | Source: Home / Self Care | Attending: Emergency Medicine | Admitting: Emergency Medicine

## 2012-11-23 DIAGNOSIS — T887XXA Unspecified adverse effect of drug or medicament, initial encounter: Secondary | ICD-10-CM

## 2012-11-23 DIAGNOSIS — T50905A Adverse effect of unspecified drugs, medicaments and biological substances, initial encounter: Secondary | ICD-10-CM

## 2012-11-23 NOTE — Discharge Instructions (Signed)
Rest and stay well hydrated as discussed. If further symptoms are not improved followup with your doctor in a couple days if worsening symptoms should go to the emergency department as discussed.

## 2012-11-23 NOTE — ED Provider Notes (Addendum)
History     CSN: [id number]  Arrival date & time 11/23/12  1611   First MD Initiated Contact with Patient 11/23/12 1653      Chief Complaint  Patient presents with  . Headache    (Consider location/radiation/quality/duration/timing/severity/associated sxs/prior treatment) HPI Comments: Pt reports she felt suicidal and took "a lot of pills"  ,  took 20 paxil 12/6/ 2014   On 11/21/2012 she went to [facility] and after a psych eval she was released.  She denies suicidal thoughts today.  She c/o feeling very tired. Headache, and weakness with dizziness.  She vomited 2 Xs this morning.  She is asking to be admitted so that a reason can be found for her extreme weakness.    Patient denies any chest pains, shortness of breath or abdominal pain.  Patient is a 26 y.o. female presenting with headaches. The history is provided by the patient and a parent.  Headache The primary symptoms include headaches and dizziness. Primary symptoms do not include syncope, loss of consciousness, altered mental status, focal weakness, loss of sensation, speech change, memory loss, fever or vomiting. The symptoms began 2 days ago. The symptoms are unchanged.  The headache is not associated with neck stiffness.  Dizziness does not occur with tinnitus, vomiting or diaphoresis.  Additional symptoms do not include neck stiffness, hearing loss, tinnitus or dysphoric mood. Medical issues do not include seizures or cerebral vascular accident. Workup history does not include MRI.    Past Medical History  Diagnosis Date  . Bipolar disorder   . Attention deficit hyperactivity disorder   . Depression   . Anxiety   . Asthma   . UTI (urinary tract infection)     Completed Cipro 08/06/12    Past Surgical History  Procedure Date  . Mouth surgery   . Eye muscle surgery     07/29/12    Family History  Problem Relation Age of Onset  . Depression Mother   . Depression Father   . Depression Brother     History   Substance Use Topics  . Smoking status: Never Smoker   . Smokeless tobacco: Never Used  . Alcohol Use: No    OB History    Grav Para Term Preterm Abortions TAB SAB Ect Mult Living   0 0              Review of Systems  Constitutional: Positive for activity change, appetite change and fatigue. Negative for fever, chills, diaphoresis and unexpected weight change.  HENT: Negative for hearing loss, neck stiffness and tinnitus.   Respiratory: Negative for shortness of breath.   Cardiovascular: Negative for chest pain, palpitations, leg swelling and syncope.  Gastrointestinal: Negative for vomiting.  Skin: Negative for rash and wound.  Neurological: Positive for dizziness and headaches. Negative for speech change, focal weakness and loss of consciousness.  Psychiatric/Behavioral: Negative for suicidal ideas, memory loss, behavioral problems, confusion, self-injury, dysphoric mood, decreased concentration, agitation and altered mental status. The patient is not nervous/anxious.     Allergies  Divalproex sodium and Methylphenidate derivatives  Home Medications   Current Outpatient Rx  Name  Route  Sig  Dispense  Refill  . ALPRAZOLAM 0.5 MG PO TABS   Oral   Take 0.5 mg by mouth at bedtime as needed. Anxiety         . ARIPIPRAZOLE 30 MG PO TABS   Oral   Take 30 mg by mouth daily.         [name]  CARBAMAZEPINE 200 MG PO TABS   Oral   Take 200-400 mg by mouth 2 (two) times daily. Take 1 tablet (200mg ) in the morning Take 2 tablets (400mg ) at bedtime         . CITALOPRAM HYDROBROMIDE 10 MG PO TABS   Oral   Take 10 mg by mouth daily. Take along with 20mg  tablet to make a total of 30mg .         . CITALOPRAM HYDROBROMIDE 20 MG PO TABS   Oral   Take 20 mg by mouth daily. Take along with 10mg  to make a total of 30mg .         . IBUPROFEN 800 MG PO TABS   Oral   Take 800 mg by mouth every 8 (eight) hours as needed. Pain         . TRAZODONE HCL 300 MG PO TABS   Oral   Take  300 mg by mouth at bedtime. For insomnia.         [name] BISMUTH SUBSALICYLATE 262 MG/15ML PO SUSP   Oral   Take 15 mLs by mouth every 6 (six) hours as needed for indigestion.   360 mL   0   . FAMOTIDINE 20 MG PO TABS   Oral   Take 1 tablet (20 mg total) by mouth 2 (two) times daily.   30 tablet   0   . GUAIFENESIN 100 MG/5ML PO LIQD   Oral   Take 10 mLs (200 mg total) by mouth 3 (three) times daily as needed for cough.   120 mL   0     BP 169/77  Pulse 94  Temp 98.6 F (37 C) (Oral)  Resp 20  SpO2 97%  LMP 11/16/2012  Physical Exam  Nursing note and vitals reviewed. Constitutional: She is oriented to person, place, and time. Vital signs are normal. She appears well-developed and well-nourished.  Non-toxic appearance. She does not have a sickly appearance. She does not appear ill. No distress.  Cardiovascular: Normal rate, regular rhythm and normal heart sounds.  Exam reveals no gallop and no friction rub.   No murmur heard.      Normal sinus rhythm, ventricular rate of 67 beats per minute. Patient monitor with handheld device for 60 seconds  Pulmonary/Chest: Effort normal.  Abdominal: Soft. Normal appearance. She exhibits no distension. There is hepatomegaly. There is no hepatosplenomegaly or splenomegaly. There is no tenderness. There is no rebound, no guarding, no tenderness at McBurney's point and negative Murphy's sign. No hernia.  Neurological: She is alert and oriented to person, place, and time.  Skin: No rash noted. No erythema.    ED Course  Procedures (including critical care time)  Labs Reviewed - No data to display No results found.   1. Medication reaction    MDM   Status post suicidal ingestion of 20 tablets of Paxil. Patient is describing headache and feeling dizzy. Had a normal cardiovascular exam. Including a rhythm strip for 60 seconds were no acute T. prolongation was observed or any other abnormalities. Patient has been encouraged to continue  with aggressive hydration and to rest I advised mother of symptoms that should warrant further evaluation in the emergency department. They're both agreeable with discharge plan and symptoms should warrant further evaluation. She is again assessed for potential suicidal ideation or homicidal ideation patient denies and mother with feels comfortable although she did admitted to closing with keys all of her medicines.     Jimmie Molly, MD  11/23/12 1816 

## 2012-11-23 NOTE — ED Notes (Signed)
Pt reports she felt suicidal and took "a lot of pills"  21 ibuprofen 11/17/2012, then took 20 paxil 12/6/ 2014   On 11/21/2012 she went to [facility] and after a psych eval she was released.  She denies suicidal thoughts today.  She c/o feeling very tired. Headache, and weakness with dizziness.  She vomited 2 Xs this morning.  She is asking to be admitted so that a reason can be found for her extreme weakness.    She is alert and  coperative.

## 2012-11-24 DIAGNOSIS — H5000 Unspecified esotropia: Secondary | ICD-10-CM | POA: Insufficient documentation

## 2012-11-29 ENCOUNTER — Encounter (HOSPITAL_COMMUNITY): Payer: Self-pay | Admitting: Emergency Medicine

## 2012-11-29 ENCOUNTER — Emergency Department (HOSPITAL_COMMUNITY): Payer: Medicare Other

## 2012-11-29 ENCOUNTER — Emergency Department (HOSPITAL_COMMUNITY)
Admission: EM | Admit: 2012-11-29 | Discharge: 2012-11-29 | Disposition: A | Discharge status: Home / Self Care | Payer: Medicare Other | Attending: Emergency Medicine | Admitting: Emergency Medicine

## 2012-11-29 DIAGNOSIS — Z975 Presence of (intrauterine) contraceptive device: Secondary | ICD-10-CM | POA: Insufficient documentation

## 2012-11-29 DIAGNOSIS — Z87440 Personal history of urinary (tract) infections: Secondary | ICD-10-CM | POA: Insufficient documentation

## 2012-11-29 DIAGNOSIS — H6120 Impacted cerumen, unspecified ear: Secondary | ICD-10-CM | POA: Insufficient documentation

## 2012-11-29 DIAGNOSIS — R059 Cough, unspecified: Secondary | ICD-10-CM | POA: Insufficient documentation

## 2012-11-29 DIAGNOSIS — F319 Bipolar disorder, unspecified: Secondary | ICD-10-CM | POA: Insufficient documentation

## 2012-11-29 DIAGNOSIS — F329 Major depressive disorder, single episode, unspecified: Secondary | ICD-10-CM | POA: Insufficient documentation

## 2012-11-29 DIAGNOSIS — Z79899 Other long term (current) drug therapy: Secondary | ICD-10-CM | POA: Insufficient documentation

## 2012-11-29 DIAGNOSIS — R071 Chest pain on breathing: Secondary | ICD-10-CM | POA: Insufficient documentation

## 2012-11-29 DIAGNOSIS — R0789 Other chest pain: Secondary | ICD-10-CM

## 2012-11-29 DIAGNOSIS — J45909 Unspecified asthma, uncomplicated: Secondary | ICD-10-CM | POA: Insufficient documentation

## 2012-11-29 DIAGNOSIS — F3289 Other specified depressive episodes: Secondary | ICD-10-CM | POA: Insufficient documentation

## 2012-11-29 DIAGNOSIS — F411 Generalized anxiety disorder: Secondary | ICD-10-CM | POA: Insufficient documentation

## 2012-11-29 DIAGNOSIS — F909 Attention-deficit hyperactivity disorder, unspecified type: Secondary | ICD-10-CM | POA: Insufficient documentation

## 2012-11-29 DIAGNOSIS — R05 Cough: Secondary | ICD-10-CM | POA: Insufficient documentation

## 2012-11-29 LAB — D-DIMER, QUANTITATIVE: D-Dimer, Quant: <0.27 ug/mL-FEU (ref 0.00–0.48)

## 2012-11-29 MED ORDER — IBUPROFEN 800 MG PO TABS
800.0000 mg | ORAL_TABLET | Freq: Once | ORAL | Status: AC
  Administered 2012-11-29: 800 mg via ORAL
  Filled 2012-11-29: qty 1

## 2012-11-29 MED ORDER — OMEPRAZOLE 20 MG PO CPDR: 20.0000 mg | DELAYED_RELEASE_CAPSULE | Freq: Every day | ORAL | Status: DC

## 2012-11-29 MED ORDER — GI COCKTAIL ~~LOC~~
30.0000 mL | Freq: Once | ORAL | Status: AC
  Administered 2012-11-29: 30 mL via ORAL
  Filled 2012-11-29: qty 30

## 2012-11-29 NOTE — ED Notes (Signed)
ems called for cp this am hurts to breath, cough x 1 week has yellow sputum

## 2012-11-29 NOTE — ED Provider Notes (Signed)
History     CSN: [id number]  Arrival date & time 11/29/12  4098   First MD Initiated Contact with Patient 11/29/12 0724      No chief complaint on file.   (Consider location/radiation/quality/duration/timing/severity/associated sxs/prior treatment) HPI Comments: Presents with anterior chest pain that started this morning and has been constant. Associated with pain with deep breathing.  Has had cough x 1 week productive of yellow sputum.  Denies fever, chills, nausea, vomiting.  No leg pain or swelling.  No abdominal pain.  On implanon birth control.  Denies dizziness or lightheadedness.  The history is provided by the patient and the EMS personnel.    Past Medical History  Diagnosis Date  . Bipolar disorder   . Attention deficit hyperactivity disorder   . Depression   . Anxiety   . Asthma   . UTI (urinary tract infection)     Completed Cipro 08/06/12    Past Surgical History  Procedure Date  . Mouth surgery   . Eye muscle surgery     07/29/12    Family History  Problem Relation Age of Onset  . Depression Mother   . Depression Father   . Depression Brother     History  Substance Use Topics  . Smoking status: Never Smoker   . Smokeless tobacco: Never Used  . Alcohol Use: No    OB History    Grav Para Term Preterm Abortions TAB SAB Ect Mult Living   0 0              Review of Systems  Constitutional: Negative for fever, activity change and appetite change.  HENT: Negative for congestion and rhinorrhea.   Respiratory: Positive for cough and chest tightness. Negative for shortness of breath.   Cardiovascular: Positive for chest pain.  Gastrointestinal: Negative for nausea, vomiting and abdominal pain.  Genitourinary: Negative for dysuria, hematuria, vaginal bleeding and vaginal discharge.  Musculoskeletal: Negative for back pain.  Skin: Negative for rash.  Neurological: Negative for dizziness, weakness and headaches.    Allergies  Divalproex sodium and  Methylphenidate derivatives  Home Medications   Current Outpatient Rx  Name  Route  Sig  Dispense  Refill  . CITALOPRAM HYDROBROMIDE 10 MG PO TABS   Oral   Take 10 mg by mouth daily. Take along with 20mg  tablet to make a total of 30mg .         . CITALOPRAM HYDROBROMIDE 20 MG PO TABS   Oral   Take 20 mg by mouth daily. Take along with 10mg  to make a total of 30mg .         [name] ALPRAZOLAM 0.5 MG PO TABS   Oral   Take 0.5 mg by mouth at bedtime as needed. Anxiety         . ARIPIPRAZOLE 30 MG PO TABS   Oral   Take 30 mg by mouth daily.         [name] BISMUTH SUBSALICYLATE 262 MG/15ML PO SUSP   Oral   Take 15 mLs by mouth every 6 (six) hours as needed for indigestion.   360 mL   0   . CARBAMAZEPINE 200 MG PO TABS   Oral   Take 200-400 mg by mouth 2 (two) times daily. Take 1 tablet (200mg ) in the morning Take 2 tablets (400mg ) at bedtime         . FAMOTIDINE 20 MG PO TABS   Oral   Take 1 tablet (20 mg total) by mouth 2 (  two) times daily.   30 tablet   0   . GUAIFENESIN 100 MG/5ML PO LIQD   Oral   Take 10 mLs (200 mg total) by mouth 3 (three) times daily as needed for cough.   120 mL   0   . IBUPROFEN 800 MG PO TABS   Oral   Take 800 mg by mouth every 8 (eight) hours as needed. Pain         . OMEPRAZOLE 20 MG PO CPDR   Oral   Take 1 capsule (20 mg total) by mouth daily.   30 capsule   0   . TRAZODONE HCL 300 MG PO TABS   Oral   Take 300 mg by mouth at bedtime. For insomnia.           BP 104/56  Pulse 74  Temp 98.5 F (36.9 C) (Oral)  Resp 16  SpO2 98%  LMP 11/16/2012  Physical Exam  Constitutional: She is oriented to person, place, and time. She appears well-developed and well-nourished. No distress.  HENT:  Head: Normocephalic and atraumatic.  Mouth/Throat: Oropharynx is clear and moist. No oropharyngeal exudate.       No exudates or asymmetry Bilateral cerumen impaction  Eyes: Conjunctivae normal and EOM are normal. Pupils are equal, round,  and reactive to light.  Neck: Normal range of motion. Neck supple.  Cardiovascular: Normal rate, regular rhythm and normal heart sounds.   No murmur heard. Pulmonary/Chest: Effort normal and breath sounds normal. No respiratory distress. She exhibits tenderness.       Reproducible chest wall tenderness  Abdominal: There is no tenderness. There is no rebound and no guarding.  Musculoskeletal: Normal range of motion. She exhibits no edema and no tenderness.  Neurological: She is alert and oriented to person, place, and time. No cranial nerve deficit. She exhibits normal muscle tone. Coordination normal.  Skin: Skin is warm.    ED Course  Procedures (including critical care time)   Labs Reviewed  D-DIMER, QUANTITATIVE   Dg Chest 2 View  11/29/2012  *RADIOLOGY REPORT*  Clinical Data: Cough.  Chest pain.  Fever.  CHEST - 2 VIEW  Comparison: Chest x-ray 10/20/2012.  Findings: Lung volumes are normal.  No consolidative airspace disease.  No pleural effusions.  No pneumothorax.  No pulmonary nodule or mass noted.  Pulmonary vasculature and the cardiomediastinal silhouette are within normal limits.  IMPRESSION: 1. No radiographic evidence of acute cardiopulmonary disease.   Original Report Authenticated By: Trudie Reed, M.D.      1. Chest wall pain       MDM  Reproducible chest pain since this morning with 1 week of productive cough.  No fever or SOB.  Lungs clear, no distress.  On arrival patient is immediately requesting how long this will take.  Her chest pain is reproducible to palpation. Her chest x-ray is clear. Her d-dimer is negative. She is stable for discharge with PPI.     Date: 11/29/2012  Rate: 72  Rhythm: normal sinus rhythm  QRS Axis: normal  Intervals: normal  ST/T Wave abnormalities: nonspecific ST/T changes  Conduction Disutrbances:none  Narrative Interpretation:   Old EKG Reviewed: unchanged    Glynn Octave, MD 11/29/12 1005

## 2012-11-29 NOTE — Discharge Instructions (Signed)
Chest Wall Pain Chest wall pain is pain in or around the bones and muscles of your chest. It may take up to 6 weeks to get better. It may take longer if you must stay physically active in your work and activities.  CAUSES  Chest wall pain may happen on its own. However, it may be caused by:  A viral illness like the flu.  Injury.  Coughing.  Exercise.  Arthritis.  Fibromyalgia.  Shingles. HOME CARE INSTRUCTIONS   Avoid overtiring physical activity. Try not to strain or perform activities that cause pain. This includes any activities using your chest or your abdominal and side muscles, especially if heavy weights are used.  Put ice on the sore area.  Put ice in a plastic bag.  Place a towel between your skin and the bag.  Leave the ice on for 15 to 20 minutes per hour while awake for the first 2 days.  Only take over-the-counter or prescription medicines for pain, discomfort, or fever as directed by your caregiver. SEEK IMMEDIATE MEDICAL CARE IF:   Your pain increases, or you are very uncomfortable.  You have a fever.  Your chest pain becomes worse.  You have new, unexplained symptoms.  You have nausea or vomiting.  You feel sweaty or lightheaded.  You have a cough with phlegm (sputum), or you cough up blood. MAKE SURE YOU:   Understand these instructions.  Will watch your condition.  Will get help right away if you are not doing well or get worse. Document Released: 12/01/2005 Document Revised: 02/23/2012 Document Reviewed: 07/28/2011 [employer] Patient Information 2013 [employer].  

## 2012-11-29 NOTE — ED Notes (Signed)
Pt states that she will refuse lab work

## 2012-12-10 ENCOUNTER — Telehealth: Payer: Self-pay | Admitting: *Deleted

## 2012-12-10 NOTE — Telephone Encounter (Signed)
Pt calls and states her chest "feels funny" it pounds, she is requesting an appt in clinic, she is referred to urg care or ED asap, she states she wants to be seen in clinic today and is advised that there are no available appts in clinic and due to her chest discomfort the Ed or urg care would be best, she states she will go now

## 2012-12-10 NOTE — Telephone Encounter (Signed)
Agree with plan 

## 2012-12-21 ENCOUNTER — Encounter (HOSPITAL_COMMUNITY): Payer: Self-pay | Admitting: Psychology

## 2012-12-21 DIAGNOSIS — F319 Bipolar disorder, unspecified: Secondary | ICD-10-CM

## 2012-12-21 NOTE — Progress Notes (Signed)
Patient ID: [name], female   DOB: 1986-08-09, 27 y.o.   MRN: [id number] Outpatient Therapist Discharge Summary  [name]    01-17-1986   Admission Date: 08/10/12   Discharge Date:  10/10/12 Reason for Discharge:  Pt wish to f/u w/ therapist, Lala Lund Diagnosis:    1. Bipolar I disorder, most recent episode (or current) unspecified      Comments:  Pt was initially referred by Dr. Lolly Mustache for counseling- pt was already in care of therapist and chose to continue w/ that therapist.  Forde Radon

## 2013-01-17 ENCOUNTER — Telehealth: Payer: Self-pay | Admitting: *Deleted

## 2013-01-17 NOTE — Telephone Encounter (Signed)
Pt calls states she awoke with a sore throat, denies fever, h/a, N&V, pain, dizziness, cough, congestion, states "i've just got a sore throat and i would like to be seen today" no appts available til wed 2/5 1400 dr Anselm Jungling per sharonb. States she may go to [facility], ask to call and cancel if she does

## 2013-01-19 ENCOUNTER — Encounter: Payer: Self-pay | Admitting: Internal Medicine

## 2013-01-19 ENCOUNTER — Ambulatory Visit: Payer: Medicare Other | Admitting: Internal Medicine

## 2013-01-19 VITALS — BP 112/78 | HR 80 | Temp 97.5°F | Ht 66.0 in | Wt 236.6 lb

## 2013-01-19 DIAGNOSIS — E669 Obesity, unspecified: Secondary | ICD-10-CM

## 2013-01-19 NOTE — Progress Notes (Signed)
Patient ID: [name], female   DOB: 08-11-1986, 27 y.o.   MRN: [id number]  [name] is a 27 yo W with bipolar, hx of drug overdose and suicidal with multiple [facility] admissions presents today because she wants some weight loss medication.  She is trying to loose weight but not working.  Tried walking and eat healthy over the summer  it did not help so now she is eating whatever she wants, lot of junk food.  Given her hx of overdose and psych history, I am not comfortable prescribing weight loss meds in this patient.  I encouraged patient to continue diet and exercise for at least several months and to discuss with her PCP if it still does not work.  Patient then walked out of the examine room before I could perform exam.  Patient was accompagnied by her mother who insisted that patient tell me about her chest pain but patient states "I don't have any chest pain right now and haven't had it in a while".  Her mother then said, "this is a waste of trip"!! They then both left our clinic.

## 2013-01-19 NOTE — Assessment & Plan Note (Signed)
-  Diet and Exercise 

## 2013-01-21 ENCOUNTER — Ambulatory Visit (HOSPITAL_COMMUNITY)
Admission: AD | Admit: 2013-01-21 | Discharge: 2013-01-21 | Disposition: A | Discharge status: Home / Self Care | Payer: Medicare Other | Attending: Psychiatry | Admitting: Psychiatry

## 2013-01-21 DIAGNOSIS — F3130 Bipolar disorder, current episode depressed, mild or moderate severity, unspecified: Secondary | ICD-10-CM | POA: Insufficient documentation

## 2013-01-21 NOTE — H&P (Signed)
Behavioral Health Medical Screening Exam  [name] is an 27 y.o. female.  Review of Systems  Constitutional: Negative.   HENT: Negative.   Eyes: Negative.   Respiratory: Negative.   Cardiovascular: Negative.   Gastrointestinal: Negative.   Genitourinary: Negative.   Musculoskeletal: Negative.   Skin: Negative.   Neurological: Negative.   Endo/Heme/Allergies: Negative.   Psychiatric/Behavioral: Negative.        Patient states that she has some depression related to breaking up with online boyfriend.  States that she is able to contract for safety.  Patient states next appointment with therapist is February 11.  Informed we would also give her other resource information on IOP.      Physical Exam  Constitutional: She is oriented to person, place, and time. She appears well-developed and well-nourished.  HENT:  Head: Normocephalic.  Eyes: Conjunctivae normal are normal. Pupils are equal, round, and reactive to light.  Neck: Normal range of motion. Neck supple.  Cardiovascular: Normal rate, regular rhythm and normal heart sounds.   Respiratory: Effort normal and breath sounds normal.  GI: Soft. Bowel sounds are normal.  Musculoskeletal: Normal range of motion.  Neurological: She is alert and oriented to person, place, and time.  Skin: Skin is warm and dry.       Patient appeared to have head lice.  Noticeable white eggs attached to shaft of hair did not see any live lice.    Psychiatric: She has a normal mood and affect. Her behavior is normal. Judgment and thought content normal.       Patient voice depression states that she has history of SI but is able to contract for safety and that she doesn't really have a plan for SI at this time.  States that if she felt that she would do something that would harm her self or others that she would come back.    Last menstrual period 11/18/2012.  Recommendations:  Based on my evaluation the patient does not appear to have an  emergency medical condition.  Resource information given.     Rankin, Shuvon 01/21/2013, 6:33 PM
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
## 2013-01-30 ENCOUNTER — Emergency Department (HOSPITAL_COMMUNITY)
Admission: EM | Admit: 2013-01-30 | Discharge: 2013-01-30 | Discharge status: Against Medical Advice | Payer: Medicare Other | Attending: Internal Medicine | Admitting: Internal Medicine

## 2013-01-30 ENCOUNTER — Encounter (HOSPITAL_COMMUNITY): Payer: Self-pay | Admitting: *Deleted

## 2013-01-30 DIAGNOSIS — F3289 Other specified depressive episodes: Secondary | ICD-10-CM | POA: Insufficient documentation

## 2013-01-30 DIAGNOSIS — F411 Generalized anxiety disorder: Secondary | ICD-10-CM | POA: Insufficient documentation

## 2013-01-30 DIAGNOSIS — Z87440 Personal history of urinary (tract) infections: Secondary | ICD-10-CM | POA: Insufficient documentation

## 2013-01-30 DIAGNOSIS — F329 Major depressive disorder, single episode, unspecified: Secondary | ICD-10-CM | POA: Insufficient documentation

## 2013-01-30 DIAGNOSIS — J45909 Unspecified asthma, uncomplicated: Secondary | ICD-10-CM | POA: Insufficient documentation

## 2013-01-30 DIAGNOSIS — R197 Diarrhea, unspecified: Secondary | ICD-10-CM | POA: Insufficient documentation

## 2013-01-30 DIAGNOSIS — F909 Attention-deficit hyperactivity disorder, unspecified type: Secondary | ICD-10-CM | POA: Insufficient documentation

## 2013-01-30 DIAGNOSIS — Z79899 Other long term (current) drug therapy: Secondary | ICD-10-CM | POA: Insufficient documentation

## 2013-01-30 DIAGNOSIS — J029 Acute pharyngitis, unspecified: Secondary | ICD-10-CM | POA: Insufficient documentation

## 2013-01-30 NOTE — ED Notes (Signed)
Pt in from home by ems. C/o sore throat, n/v/d today.

## 2013-01-30 NOTE — ED Notes (Signed)
Pt called x 2 from waiting room, no answer.

## 2013-02-03 ENCOUNTER — Encounter: Payer: Self-pay | Admitting: Internal Medicine

## 2013-02-03 ENCOUNTER — Ambulatory Visit (INDEPENDENT_AMBULATORY_CARE_PROVIDER_SITE_OTHER): Payer: Medicare Other | Admitting: Internal Medicine

## 2013-02-03 VITALS — BP 123/93 | HR 70 | Temp 98.9°F | Wt 241.6 lb

## 2013-02-03 DIAGNOSIS — K219 Gastro-esophageal reflux disease without esophagitis: Secondary | ICD-10-CM

## 2013-02-03 DIAGNOSIS — R1011 Right upper quadrant pain: Secondary | ICD-10-CM

## 2013-02-03 DIAGNOSIS — R1110 Vomiting, unspecified: Secondary | ICD-10-CM

## 2013-02-03 DIAGNOSIS — K921 Melena: Secondary | ICD-10-CM

## 2013-02-03 DIAGNOSIS — R197 Diarrhea, unspecified: Secondary | ICD-10-CM

## 2013-02-03 DIAGNOSIS — F3132 Bipolar disorder, current episode depressed, moderate: Secondary | ICD-10-CM

## 2013-02-03 LAB — URINALYSIS, ROUTINE W REFLEX MICROSCOPIC
Bilirubin Urine: NEGATIVE
Glucose, UA: NEGATIVE mg/dL
Hgb urine dipstick: NEGATIVE
Ketones, ur: NEGATIVE mg/dL
Leukocytes, UA: NEGATIVE
Nitrite: NEGATIVE
Protein, ur: NEGATIVE mg/dL
Specific Gravity, Urine: 1.026 (ref 1.005–1.030)
Urobilinogen, UA: 0.2 mg/dL (ref 0.0–1.0)
pH: 5.5 (ref 5.0–8.0)

## 2013-02-03 LAB — COMPLETE METABOLIC PANEL WITH GFR
ALT: 16 U/L (ref 0–35)
AST: 15 U/L (ref 0–37)
Albumin: 4.1 g/dL (ref 3.5–5.2)
Alkaline Phosphatase: 89 U/L (ref 39–117)
BUN: 9 mg/dL (ref 6–23)
CO2: 27 mEq/L (ref 19–32)
Calcium: 9.3 mg/dL (ref 8.4–10.5)
Chloride: 101 mEq/L (ref 96–112)
Creat: 0.78 mg/dL (ref 0.50–1.10)
GFR, Est African American: >89 mL/min
GFR, Est Non African American: >89 mL/min
Glucose, Bld: 87 mg/dL (ref 70–99)
Potassium: 3.9 mEq/L (ref 3.5–5.3)
Sodium: 138 mEq/L (ref 135–145)
Total Bilirubin: 0.2 mg/dL — ABNORMAL LOW (ref 0.3–1.2)
Total Protein: 7.6 g/dL (ref 6.0–8.3)

## 2013-02-03 LAB — CBC WITH DIFFERENTIAL/PLATELET
Basophils Absolute: 0 10*3/uL (ref 0.0–0.1)
Basophils Relative: 0 % (ref 0–1)
Eosinophils Absolute: 0 10*3/uL (ref 0.0–0.7)
Eosinophils Relative: 0 % (ref 0–5)
HCT: 42.9 % (ref 36.0–46.0)
Hemoglobin: 14.3 g/dL (ref 12.0–15.0)
Lymphocytes Relative: 28 % (ref 12–46)
Lymphs Abs: 1.8 10*3/uL (ref 0.7–4.0)
MCH: 31.4 pg (ref 26.0–34.0)
MCHC: 33.3 g/dL (ref 30.0–36.0)
MCV: 94.1 fL (ref 78.0–100.0)
Monocytes Absolute: 0.4 10*3/uL (ref 0.1–1.0)
Monocytes Relative: 6 % (ref 3–12)
Neutro Abs: 4.2 10*3/uL (ref 1.7–7.7)
Neutrophils Relative %: 66 % (ref 43–77)
Platelets: 178 10*3/uL (ref 150–400)
RBC: 4.56 MIL/uL (ref 3.87–5.11)
RDW: 12.8 % (ref 11.5–15.5)
WBC: 6.4 10*3/uL (ref 4.0–10.5)

## 2013-02-03 LAB — POCT URINE PREGNANCY: Preg Test, Ur: NEGATIVE

## 2013-02-03 LAB — LIPASE: Lipase: 20 U/L (ref 11–59)

## 2013-02-03 MED ORDER — PANTOPRAZOLE SODIUM 40 MG PO TBEC: 40.0000 mg | DELAYED_RELEASE_TABLET | Freq: Every day | ORAL | Status: DC

## 2013-02-03 NOTE — Patient Instructions (Signed)
General Instructions: -Please take Protonix for your heart burn -You need to come back next week for reevaluation of your symptoms -Please bring the stool sample as soon as possible.    Treatment Goals:  Goals (1 Years of Data) as of 02/03/13   None      Progress Toward Treatment Goals:  Treatment Goal 02/03/2013  Other  unchanged    Self Care Goals & Plans:  Self Care Goal 02/03/2013  Manage my medications bring my medications to every visit  Eat healthy foods eat baked foods instead of fried foods; eat foods that are low in salt; eat more vegetables; eat smaller portions  Be physically active find an activity I enjoy       Care Management & Community Referrals:  Referral 02/03/2013  Referrals made for care management support none needed

## 2013-02-04 DIAGNOSIS — R197 Diarrhea, unspecified: Secondary | ICD-10-CM | POA: Insufficient documentation

## 2013-02-04 DIAGNOSIS — R1110 Vomiting, unspecified: Secondary | ICD-10-CM | POA: Insufficient documentation

## 2013-02-04 NOTE — Assessment & Plan Note (Signed)
She reports 5-7 days of diarrhea she at first denies blood in her stool but reported this finding after her mother left the exam room. Differential to include viral gastroenteritis, bacterial gastroenteritis (including C. Dif colitis given her recent Abx use for recent dental work), parasite infection (particularly Giardia which is common in this region), hepatitis, drug induced (though no new prescriptions were recently started), or IBS (she broke up with her boyfriend of one year just prior to onset of her diarrhea).  -CBC, CMP, lipase, UA normal.  -Ordered stool studies, pending pt bringing stool sample. She was given stool sample collection kit to take home with instructions explained to her and to her mother.  -Pt asked to return next week for further evaluation, hopefully stool studies results will be ready by then. The pt agreed and understood.

## 2013-02-04 NOTE — Assessment & Plan Note (Addendum)
She reports 1-2 emesis that is non bilious, non bloody, small amount of ingested food immediately after eating fried foods. She reports good appetite with adequate intake per mouth. She denies nausea. This has never happened to her before. She does not think she is pregnant; her mother reports that she has not been sexually active. She has upper quadrant tenderness on exam, then LLQ tenderness (confusing physical exam 2/2 pt's mental health). This could be 2/2 gallbladder disease.  -BMET STAT normal -lipase STAT normal -Pregnancy test negative -CBC with no leukocytosis -Pt encouraged to stay hydrated and avoid fried foods -Would consider abdominal US for gallbladder eval if this persists -Pt asked to follow up in clinic next week.

## 2013-02-04 NOTE — Assessment & Plan Note (Signed)
Pt initially denied blood in the stools but later kept asking to be admitted then reporting "a lot" of blood in her stool.  CBC STAT with normal Hgb.  -Continue monitoring

## 2013-02-04 NOTE — Assessment & Plan Note (Addendum)
Calm and cooperative during exam. She will continue following up with her psychiatrist.

## 2013-02-04 NOTE — Progress Notes (Signed)
Subjective:    Patient ID: [name], female    DOB: 01-May-1986, 27 y.o.   MRN: [id number]  HPI Ms. Carroll is a 27 year old woman with extensive psychiatric history and obesity who comes in for evaluation of sore throat, and vomiting and diarrhea for 5-7 days. She is accompanied by her mother. She reports that she has been having liquid and soft stools multiple times per day. She notes that she has seen some blood in her stool but cannot quantify it. Her sore throat started one week ago and has improved, she reports subjective fever for one day at the onset of her sore throat. She has vomited 1-2 times per day for the past week or so. She denies nausea but states that she vomits small amounts of food immediately after eating fried greasy foods. She denies bloody emesis. She denies cough, rhinorrhea, or post-nasal drip.   She also complains of LLQ pain that has been present for one day but is similar to menstrual cramping, she thinks her period will come soon. She denies dysuria or vaginal discharge.   She broke up with her boyfriend on 1 year one week ago, just prior to the onset of her symptoms.      Review of Systems  Constitutional: Negative for fever, chills, diaphoresis, activity change, appetite change, fatigue and unexpected weight change.  HENT: Positive for sore throat. Negative for nosebleeds, congestion, rhinorrhea, trouble swallowing and postnasal drip.   Eyes: Negative for pain and itching.  Respiratory: Negative for cough, chest tightness, shortness of breath and wheezing.   Cardiovascular: Negative for chest pain, palpitations and leg swelling.  Gastrointestinal: Positive for vomiting, abdominal pain and diarrhea. Negative for nausea.  Genitourinary: Negative for dysuria, frequency, flank pain, vaginal discharge and vaginal pain.  Musculoskeletal: Negative for myalgias.  Skin: Negative for color change, pallor, rash and wound.  Neurological: Negative for dizziness,  tremors, weakness, light-headedness and headaches.  Hematological: Negative for adenopathy. Does not bruise/bleed easily.  Psychiatric/Behavioral: Negative for suicidal ideas, dysphoric mood and agitation.       Objective:   Physical Exam  Nursing note and vitals reviewed. Constitutional: No distress.  Obese  HENT:  Nose: Nose normal.  Mouth/Throat: Oropharynx is clear and moist. No oropharyngeal exudate.  No tonsillar edema or erythema. Nasal turbinates normal  Eyes: Conjunctivae are normal. Right eye exhibits no discharge. Left eye exhibits no discharge. No scleral icterus.  Wearing prescription glasses   Neck: Neck supple. No thyromegaly present.  Cardiovascular: Normal rate and regular rhythm.   Pulmonary/Chest: Effort normal and breath sounds normal. No respiratory distress. She has no wheezes. She has no rales. She exhibits no tenderness.  Abdominal: Soft. Bowel sounds are normal. She exhibits no distension and no mass. There is tenderness. There is no rebound and no guarding.  Initially she reported LLQ tenderness.  RUQ tenderness also noted.  Repeat exam with attending, Dr. Meredith Pel, pt reports RLQ tenderness but no LLQ tenderness.   Musculoskeletal: She exhibits no edema and no tenderness.  Lymphadenopathy:    She has no cervical adenopathy.  Neurological: She is alert.  Skin: Skin is warm and dry. No rash noted. She is not diaphoretic. No erythema. No pallor.  Psychiatric:  Poor eye contact, distracted, defer questions to her mother at times, texting on her smart phone during most of the interview. Repeatedly asking to be admitted to the hospital.   Not anxious appearing, however, no SI/HI.  Assessment & Plan:

## 2013-02-04 NOTE — Assessment & Plan Note (Signed)
She describes having acid reflux with chest discomfort recently. She currently has regurgitation of food/vomiting which could be 2/2 worsening of her GERD.  -Protonix 40mg  daily.

## 2013-02-05 LAB — URINE CULTURE: Colony Count: >=100000

## 2013-02-08 ENCOUNTER — Encounter: Payer: Self-pay | Admitting: Internal Medicine

## 2013-02-08 ENCOUNTER — Ambulatory Visit (INDEPENDENT_AMBULATORY_CARE_PROVIDER_SITE_OTHER): Payer: Medicare Other | Admitting: Internal Medicine

## 2013-02-08 VITALS — BP 111/72 | HR 73 | Temp 97.6°F | Resp 20 | Ht 64.0 in | Wt 235.7 lb

## 2013-02-08 DIAGNOSIS — R1110 Vomiting, unspecified: Secondary | ICD-10-CM

## 2013-02-08 DIAGNOSIS — K219 Gastro-esophageal reflux disease without esophagitis: Secondary | ICD-10-CM

## 2013-02-08 DIAGNOSIS — K921 Melena: Secondary | ICD-10-CM

## 2013-02-08 DIAGNOSIS — F3132 Bipolar disorder, current episode depressed, moderate: Secondary | ICD-10-CM

## 2013-02-08 DIAGNOSIS — R197 Diarrhea, unspecified: Secondary | ICD-10-CM

## 2013-02-08 DIAGNOSIS — F319 Bipolar disorder, unspecified: Secondary | ICD-10-CM

## 2013-02-08 NOTE — Addendum Note (Signed)
Addended by: Bufford Spikes on: 02/08/2013 04:08 PM   Modules accepted: Orders

## 2013-02-08 NOTE — Patient Instructions (Addendum)
General Instructions: -We will call you with an appointment for the Gastroenterologist -[name], the social worker will contact you in regards to finding a new counselor -We will call you if your stool results are abnormal -Follow up with [place] in two weeks but sooner if you develop fever of temperature of 100.70F or chills, decreased food or fluid intake or worsening diarrhea.  -Please let us know if there is anything we can help you with.    Treatment Goals:  Continue taking your medicines  Progress Toward Treatment Goals:  Treatment Goal 02/03/2013  Other  unchanged    Self Care Goals & Plans:  Self Care Goal 02/03/2013  Manage my medications bring my medications to every visit  Eat healthy foods eat baked foods instead of fried foods; eat foods that are low in salt; eat more vegetables; eat smaller portions  Be physically active find an activity I enjoy       Care Management & Community Referrals:  Referral 02/03/2013  Referrals made for care management support none needed

## 2013-02-09 LAB — GIARDIA ANTIGEN: Giardia Screen (EIA): NEGATIVE

## 2013-02-09 LAB — FECAL FAT QUALITATIVE
Free Fatty Acids: NORMAL
NEUTRAL FAT: NORMAL

## 2013-02-09 LAB — CLOSTRIDIUM DIFFICILE EIA: CDIFTX: NEGATIVE

## 2013-02-09 LAB — FECAL LACTOFERRIN, QUANT: Lactoferrin: NEGATIVE

## 2013-02-09 NOTE — Assessment & Plan Note (Signed)
Resolved now after initiation of Protonix

## 2013-02-09 NOTE — Progress Notes (Signed)
  Subjective:    Patient ID: [name], female    DOB: 1986-04-07, 27 y.o.   MRN: [id number]  HPI [name] is a 27 year old woman with PMH listed below who comes in for follow up visit for diarrhea and vomiting. She states that her diarrhea is persistent and she has noticed blood in the toilet, in the stool. She denies anal/rectal pain or bleeding. Her mother accompanies her today and states that the diarrhea has been present off and on for two months now.  Since her last visit, [name] has been taking Protonix for her GERD symptoms and reports no improvement of her emesis with no vomiting in a two days.  She brought her stool sample with her and they are liquid and brown with no obvious blood in it.   She denies decreased appetite or intake per mouth. She denies recent travel or sick contacts.    Review of Systems  Constitutional: Negative for fever, chills, diaphoresis, activity change, appetite change, fatigue and unexpected weight change.  HENT: Negative for sore throat.   Respiratory: Negative for cough, chest tightness, shortness of breath and wheezing.   Cardiovascular: Negative for chest pain, palpitations and leg swelling.  Gastrointestinal: Positive for diarrhea and blood in stool. Negative for nausea, vomiting, abdominal pain and constipation.  Genitourinary: Negative for dysuria, urgency, frequency, flank pain, vaginal discharge and difficulty urinating.  Musculoskeletal: Negative for myalgias and back pain.  Skin: Negative for color change, pallor, rash and wound.  Neurological: Negative for dizziness, syncope, weakness, light-headedness and headaches.  Hematological: Negative for adenopathy. Does not bruise/bleed easily.  Psychiatric/Behavioral: Negative for behavioral problems and agitation.       Objective:   Physical Exam  Nursing note and vitals reviewed. Constitutional: She is oriented to person, place, and time. No distress.  Obese  HENT:  Head:  Atraumatic.  Eyes: Conjunctivae are normal. Right eye exhibits no discharge. Left eye exhibits no discharge. No scleral icterus.  Neck: No thyromegaly present.  Cardiovascular: Normal rate.   Pulmonary/Chest: Effort normal. No respiratory distress.  Abdominal: Soft. Bowel sounds are normal. She exhibits no distension and no mass. There is tenderness. There is no rebound and no guarding.  Diffuse abdominal tenderness that improve with distraction but RUQ tenderness more prevalent.   Musculoskeletal: She exhibits no edema and no tenderness.  Neurological: She is alert and oriented to person, place, and time.  Skin: Skin is warm and dry. No rash noted. She is not diaphoretic. No erythema. No pallor.  Psychiatric: She has a normal mood and affect.  She is calm and cooperative to her exam.           Assessment & Plan:

## 2013-02-09 NOTE — Assessment & Plan Note (Signed)
Symptoms improved with Protonix, no vomiting (regurgitation of food) reported in the last 2 days.

## 2013-02-09 NOTE — Addendum Note (Signed)
Addended by: Bufford Spikes on: 02/09/2013 02:28 PM   Modules accepted: Orders

## 2013-02-09 NOTE — Assessment & Plan Note (Addendum)
The pt and her mother would like a different mental health provider. Referral to Lynnae January, [facility] CSW for behavioral health referral

## 2013-02-09 NOTE — Assessment & Plan Note (Addendum)
Per pt's mom, diarrhea has been present off and on for two months now.  Pt provided stool sample during visit.  -Stool lactoferrin, fat, culture, Giardia Ag, C. Diff  -referral to GI for further evaluation of possible IBS

## 2013-02-09 NOTE — Assessment & Plan Note (Signed)
She is unable to quantify it.  -Stool for occult blood.

## 2013-02-17 ENCOUNTER — Telehealth: Payer: Self-pay | Admitting: Licensed Clinical Social Worker

## 2013-02-17 ENCOUNTER — Ambulatory Visit (INDEPENDENT_AMBULATORY_CARE_PROVIDER_SITE_OTHER): Payer: Medicare Other | Admitting: Internal Medicine

## 2013-02-17 ENCOUNTER — Encounter: Payer: Self-pay | Admitting: Internal Medicine

## 2013-02-17 VITALS — BP 132/83 | HR 92 | Temp 97.6°F | Wt 238.9 lb

## 2013-02-17 DIAGNOSIS — R300 Dysuria: Secondary | ICD-10-CM

## 2013-02-17 DIAGNOSIS — R109 Unspecified abdominal pain: Secondary | ICD-10-CM | POA: Insufficient documentation

## 2013-02-17 DIAGNOSIS — R197 Diarrhea, unspecified: Secondary | ICD-10-CM

## 2013-02-17 LAB — POCT URINE PREGNANCY: Preg Test, Ur: NEGATIVE

## 2013-02-17 NOTE — Progress Notes (Signed)
Subjective:   Patient ID: [name] female   DOB: Jun 15, 1986 27 y.o.   MRN: [id number]  HPI: [name] is a 27 y.o. obese white female with PMH of bipolar disorder, ADHD, and GERD presenting to clinic today with her mother for continued complaints of N/V/D and abdominal pain.  She claims she has been having these symptoms for at least 1 month.  She apparently vomited this morning after taking her medications with pepsi.  She thinks she noticed small amount of blood with phlegm in her vomit upon questions.  She endorses vomiting daily along with diarrhea 2-3 episodes daily, and constant abdominal pain worse in lower quadrants.  She had a high fat with lots of cheese diet.  Her last meal was chicken nuggets last night and she has been able to eat daily despite her symptoms.  She denies blood in her stool today.    LMP ~3/1 or 3/2 lasting 3-4 days.  Sexually active, no condoms but claims to have depo-provera in arm, had sex last night.  Endorses occasional burning with urination, denies abnormal discharge and being tested for STIs last year.    She denies any chest pain, shortness of breath, fever, chills, or constipation at this time.    Past Medical History  Diagnosis Date  . Bipolar disorder   . Attention deficit hyperactivity disorder   . Depression   . Anxiety   . Asthma   . UTI (urinary tract infection)     Completed Cipro 08/06/12   Current Outpatient Prescriptions  Medication Sig Dispense Refill  . ALPRAZolam (XANAX) 0.5 MG tablet Take 0.5 mg by mouth 3 (three) times daily. Anxiety      . ARIPiprazole (ABILIFY) 30 MG tablet Take 30 mg by mouth daily.      . carbamazepine (TEGRETOL) 200 MG tablet Take 200-400 mg by mouth 2 (two) times daily. Take 1 tablet (200mg ) in the morning Take 2 tablets (400mg ) at bedtime      . citalopram (CELEXA) 20 MG tablet Take 20 mg by mouth daily. Take along with 10mg  to make a total of 30mg .      . NUEDEXTA 20-10 MG CAPS       .  pantoprazole (PROTONIX) 40 MG tablet Take 1 tablet (40 mg total) by mouth daily.  30 tablet  1  . trazodone (DESYREL) 300 MG tablet Take 300 mg by mouth at bedtime. For insomnia.      . citalopram (CELEXA) 10 MG tablet Take 10 mg by mouth daily. Take along with 20mg  tablet to make a total of 30mg .      . promethazine (PHENERGAN) 12.5 MG tablet        No current facility-administered medications for this visit.   Family History  Problem Relation Age of Onset  . Depression Mother   . Depression Father   . Depression Brother    History   Social History  . Marital Status: Single    Spouse Name: N/A    Number of Children: N/A  . Years of Education: N/A   Social History Main Topics  . Smoking status: Never Smoker   . Smokeless tobacco: Never Used  . Alcohol Use: No  . Drug Use: No  . Sexually Active: Yes    Birth Control/ Protection: Inserts   Other Topics Concern  . None   Social History Narrative  . None   Review of Systems: Constitutional: Denies fever, chills, diaphoresis, appetite change and fatigue.  HEENT: Denies  photophobia, eye pain, redness, hearing loss, ear pain, congestion, sore throat, rhinorrhea, sneezing, mouth sores, trouble swallowing, neck pain, neck stiffness and tinnitus.   Respiratory: Denies SOB, DOE, cough, chest tightness,  and wheezing.   Cardiovascular: Denies chest pain, palpitations and leg swelling.  Gastrointestinal: Nausea, vomiting, abdominal pain, diarrhea.  Denies constipation, blood in stool and abdominal distention.  Genitourinary: Denies dysuria, urgency, frequency, hematuria, flank pain and difficulty urinating.  Musculoskeletal: Denies myalgias, back pain, joint swelling, arthralgias and gait problem.  Skin: Denies pallor, rash and wound.  Neurological: Denies dizziness, seizures, syncope, weakness, light-headedness, numbness and headaches.  Hematological: Denies adenopathy. Easy bruising, personal or family bleeding history   Psychiatric/Behavioral: Denies suicidal ideation, mood changes, confusion, nervousness, sleep disturbance and agitation.  Bipolar and depression.    Objective:  Physical Exam: Filed Vitals:   02/17/13 1328  BP: 132/83  Pulse: 92  Temp: 97.6 F (36.4 C)  TempSrc: Oral  Weight: 238 lb 14.4 oz (108.364 kg)  SpO2: 98%   Constitutional: Vital signs reviewed.  Patient is an obese female in acute distress and cooperative with exam. Alert and oriented x3.  Head: Normocephalic and atraumatic Ear: TM normal bilaterally Mouth: no erythema or exudates, MMM Eyes: PERRL, EOMI, conjunctivae normal, No scleral icterus.  Neck: Supple, Trachea midline normal ROM Cardiovascular: RRR, S1 normal, S2 normal, no MRG, pulses symmetric and intact bilaterally Pulmonary/Chest: CTAB, no wheezes, rales, or rhonchi Abdominal: Soft. Obese, tender to deep palpation of lower abdomen, non-distended, bowel sounds are normal, no masses, organomegaly, or guarding present.  GU: no CVA tenderness Musculoskeletal: No joint deformities, erythema, or stiffness, ROM full and no nontender Hematology: no cervical, inginal, or axillary adenopathy.  Neurological: A&O x3, Strength is normal and symmetric bilaterally, cranial nerve II-XII are grossly intact, no focal motor deficit, sensory intact to light touch bilaterally.  Skin: Warm, dry and intact. No rash, cyanosis, or clubbing.  Psychiatric: Normal mood and affect. speech and behavior is normal. Judgment and thought content normal. Cognition and memory are normal. Bipolar and Depression  Assessment & Plan:  Discussed with Dr. Criselda Peaches  N/V/D and abdominal pain: GI referral, imodium, psyllium, low lactose diet, fobt negative and urine preg neg

## 2013-02-17 NOTE — Assessment & Plan Note (Addendum)
Continue to complain of diarrhea and associated nausea, vomiting, and abdominal pain x several weeks.  Poor historian. Continues to gain weight and eat despite claiming to have vomiting and pain.   Has tried imodium with mild improvement. Eats lots of cheese. Prior stool studies negative.  Rectal exam done today--FOBT negative.    ?IBS vs. Lactose intolerance? -recommend imodium and psyllium -GI referral--next week visit -try low lactose diet for 2 weeks to see if any improvement

## 2013-02-17 NOTE — Patient Instructions (Addendum)
Please try immodium and Psyllium for your diarrhea  Please follow up your GI doctor appointment next week  You can try to take Zofran for your nausea as needed  If you pain gets severe or symptoms persist, call or come to clinic [phone] or go to ED  TRY TO AVOID LACTOSE as much as possible in your diet for the next two weeks

## 2013-02-17 NOTE — Assessment & Plan Note (Addendum)
No relief with NSAIDS. Tender to palpation of lower abdomen although inconsistent during physical exam.  Start of clinic was lying in bed due to pain and by the end of clinic was up and walking around.  Initially refused urine sample then agreed.  Is sexually active, last encounter yesterday.  Abdominal pain associated with N/V/D.  Constant and crampy in nature.  Continues to gain weight and eat.   -urine preg negative -try low lactose diet x2 weeks -GI referral -f/u U/A, consider adding tox screen if possible has hx of amphetamine use -may need STI recheck

## 2013-02-17 NOTE — Telephone Encounter (Signed)
[name] was referred to CSW for mental health providers.  CSW placed call and left message, pt's mother returned call.  [name] is currently being seen at [facility], but mother states pt's therapist is d/c pt due to lack of payment from [facility].  CSW checked pt's medicaid, listing is [facility] for PCP.  Therefore, the referral entity is not an issue for medicaid non-payment.  Mother states her and pt will be in [facility] today for a f/u appt.  Time of appt is after CSW hours.  CSW informed mother, CSW will provide listing of Medicaid approved mental health providers from [facility] website.  CSW will also leave ROI for pt to sign once pt has chosen agency.  Mother informed all paperwork will be left with nursing staff working with physician today.

## 2013-02-18 LAB — URINALYSIS, ROUTINE W REFLEX MICROSCOPIC
Bilirubin Urine: NEGATIVE
Glucose, UA: NEGATIVE mg/dL
Hgb urine dipstick: NEGATIVE
Ketones, ur: NEGATIVE mg/dL
Leukocytes, UA: NEGATIVE
Nitrite: NEGATIVE
Protein, ur: NEGATIVE mg/dL
Specific Gravity, Urine: 1.017 (ref 1.005–1.030)
Urobilinogen, UA: 0.2 mg/dL (ref 0.0–1.0)
pH: 6 (ref 5.0–8.0)

## 2013-02-22 ENCOUNTER — Encounter: Payer: Self-pay | Admitting: Internal Medicine

## 2013-02-22 ENCOUNTER — Ambulatory Visit: Payer: Self-pay | Admitting: Internal Medicine

## 2013-02-28 ENCOUNTER — Ambulatory Visit: Payer: Self-pay | Admitting: Internal Medicine

## 2013-03-16 ENCOUNTER — Encounter: Payer: Self-pay | Admitting: Internal Medicine

## 2013-03-18 ENCOUNTER — Ambulatory Visit (INDEPENDENT_AMBULATORY_CARE_PROVIDER_SITE_OTHER): Payer: Medicare Other | Admitting: Internal Medicine

## 2013-03-18 ENCOUNTER — Encounter: Payer: Self-pay | Admitting: Internal Medicine

## 2013-03-18 VITALS — BP 94/62 | HR 96 | Ht 64.75 in | Wt 229.1 lb

## 2013-03-18 DIAGNOSIS — R112 Nausea with vomiting, unspecified: Secondary | ICD-10-CM

## 2013-03-18 DIAGNOSIS — K625 Hemorrhage of anus and rectum: Secondary | ICD-10-CM

## 2013-03-18 DIAGNOSIS — R197 Diarrhea, unspecified: Secondary | ICD-10-CM

## 2013-03-18 DIAGNOSIS — R109 Unspecified abdominal pain: Secondary | ICD-10-CM

## 2013-03-18 MED ORDER — LOPERAMIDE HCL 2 MG PO TABS: 2.0000 mg | ORAL_TABLET | Freq: Four times a day (QID) | ORAL | Status: DC | PRN

## 2013-03-18 NOTE — Progress Notes (Signed)
Patient ID: [name], female   DOB: 1986-05-30, 27 y.o.   MRN: [id number] HPI: [name] is a 27 yo female with PMH of ADHD, bipolar disorder with anxiety and depression, mild asthma who seen in consultation at the request of Dr. Wyn Quaker for evaluation of diarrhea, abdominal pain, nausea and vomiting. The patient reports ongoing issues with diarrhea occurring 5-7 times per day. She describes her stools as watery. She's also had some rectal bleeding with her diarrhea. She describes the blood as bright red. The diarrhea has been worse over the last 4-6 weeks. She denies any new medications started around this time.  She reports significant abdominal pain which is lower in described as sharp and stabbing. This is worse just before bowel movements. She reports prior to developing diarrhea her stools are regular, formed occurring every other day. She does report some issues with heartburn and was taking pantoprazole 40 mg but she is off of this. She's tried Pepto-Bismol for heartburn with some relief. She reports nausea worse after eating with intermittent vomiting. Her emesis has been non-bloody. Stool studies were performed by her primary care negative. She's not used any other antidiarrheal.  He denies fevers or chills.  Past Medical History  Diagnosis Date  . Bipolar disorder   . Attention deficit hyperactivity disorder   . Depression   . Anxiety   . Asthma   . UTI (urinary tract infection)     Completed Cipro 08/06/12    Past Surgical History  Procedure Laterality Date  . Mouth surgery    . Eye muscle surgery Bilateral     07/29/12    Current Outpatient Prescriptions  Medication Sig Dispense Refill  . ARIPiprazole (ABILIFY) 30 MG tablet Take 30 mg by mouth daily.      . carbamazepine (TEGRETOL) 200 MG tablet Take 200-400 mg by mouth 2 (two) times daily. Take 1 tablet (200mg ) in the morning Take 2 tablets (400mg ) at bedtime      . citalopram (CELEXA) 10 MG tablet Take 10 mg by  mouth daily. Take along with 20mg  tablet to make a total of 30mg .      . citalopram (CELEXA) 20 MG tablet Take 20 mg by mouth daily. Take along with 10mg  to make a total of 30mg .      . loperamide (IMODIUM A-D) 2 MG tablet Take 1 tablet (2 mg total) by mouth 4 (four) times daily as needed for diarrhea or loose stools.  30 tablet  0  . NUEDEXTA 20-10 MG CAPS       . pantoprazole (PROTONIX) 40 MG tablet Take 1 tablet (40 mg total) by mouth daily.  30 tablet  1  . promethazine (PHENERGAN) 12.5 MG tablet       . trazodone (DESYREL) 300 MG tablet Take 300 mg by mouth at bedtime. For insomnia.       No current facility-administered medications for this visit.    Allergies  Allergen Reactions  . Depakote (Divalproex Sodium)     Pt had h/o intolerance from Depakote. Stated that she never wants this med.  . Methylphenidate Derivatives     Gets depressed and lots of anger  . Ritalin (Methylphenidate Hcl)     Family History  Problem Relation Age of Onset  . Depression Mother   . Depression Father   . Depression Brother   . Colon cancer Mother   . Colon polyps Mother   . Liver disease    . Kidney disease  History   Social History  . Marital Status: Single    Spouse Name: N/A    Number of Children: 0  . Years of Education: N/A   Social History Main Topics  . Smoking status: Never Smoker   . Smokeless tobacco: Never Used  . Alcohol Use: No  . Drug Use: No  . Sexually Active: Yes    Birth Control/ Protection: Inserts   Other Topics Concern  . None   Social History Narrative  . None    ROS: As per history of present illness, otherwise negative  BP 94/62  Pulse 96  Ht 5' 4.75" (1.645 m)  Wt 229 lb 2 oz (103.93 kg)  BMI 38.41 kg/m2 Constitutional: Well-developed and well-nourished. No distress. HEENT: Normocephalic and atraumatic. Oropharynx is clear and moist. No oropharyngeal exudate. Conjunctivae are normal.  No scleral icterus. Neck: Neck supple. Trachea  midline. Cardiovascular: Normal rate, regular rhythm and intact distal pulses. No M/R/G Pulmonary/chest: Effort normal and breath sounds normal. No wheezing, rales or rhonchi. Abdominal: Soft,  Mild diffuse tenderness without rebound or guarding, nondistended. Bowel sounds active throughout.  Extremities: no clubbing, cyanosis, or edema Lymphadenopathy: No cervical adenopathy noted. Neurological: Alert and oriented to person place and time. Skin: Skin is warm and dry. No rashes noted. Psychiatric: Normal mood and affect. Behavior is normal.  RELEVANT LABS AND IMAGING: CBC    Component Value Date/Time   WBC 6.4 02/03/2013 1521   RBC 4.56 02/03/2013 1521   HGB 14.3 02/03/2013 1521   HCT 42.9 02/03/2013 1521   PLT 178 02/03/2013 1521   MCV 94.1 02/03/2013 1521   MCH 31.4 02/03/2013 1521   MCHC 33.3 02/03/2013 1521   RDW 12.8 02/03/2013 1521   LYMPHSABS 1.8 02/03/2013 1521   MONOABS 0.4 02/03/2013 1521   EOSABS 0.0 02/03/2013 1521   BASOSABS 0.0 02/03/2013 1521    CMP     Component Value Date/Time   NA 138 02/03/2013 1521   K 3.9 02/03/2013 1521   CL 101 02/03/2013 1521   CO2 27 02/03/2013 1521   GLUCOSE 87 02/03/2013 1521   BUN 9 02/03/2013 1521   CREATININE 0.78 02/03/2013 1521   CREATININE 0.92 11/21/2012 2125   CALCIUM 9.3 02/03/2013 1521   PROT 7.6 02/03/2013 1521   ALBUMIN 4.1 02/03/2013 1521   AST 15 02/03/2013 1521   ALT 16 02/03/2013 1521   ALKPHOS 89 02/03/2013 1521   BILITOT 0.2* 02/03/2013 1521   GFRNONAA 86* 11/21/2012 2125   GFRAA >90 11/21/2012 2125   Lipase     Component Value Date/Time   LIPASE 20 02/03/2013 1521   Labs dated 02/14/2013 C. difficile toxin negative, lactoferrin neg,  fatty acids negative,  pregnancy test negative  Giardia screen negative UA unremarkable   ASSESSMENT/PLAN: 27 yo female with PMH of ADHD, bipolar disorder with anxiety and depression, mild asthma who seen in consultation at the request of Dr. Wyn Quaker for evaluation of diarrhea, abdominal pain,  nausea and vomiting.  1.  Abd pain/diarrhea/N/V/rectal bleeding -- she has multiple abdominal complaints, all of which have been going on longer than one would expect for an acute viral or bacterial gastroenteritis. Her stool studies were negative and labs unrevealing to this point.  I recommended loperamide as an antidiarrheal. We also will proceed with upper endoscopy and colonoscopy to further evaluate her symptoms. We discussed the test today including the risks and benefits and she is agreeable to proceed. Zofran 4 mg as needed and as directed for nausea.  Further recommendations after procedures

## 2013-03-18 NOTE — Patient Instructions (Addendum)
You have been scheduled for a colonoscopy/Endoscopy with propofol. Please follow written instructions given to you at your visit today.  Please pick up your prep kit at the pharmacy within the next 1-3 days. If you use inhalers (even only as needed), please bring them with you on the day of your procedure.  We have sent the following medications to your pharmacy for you to pick up at your convenience: Moviprep; you were given samples today at your office visit.  Take immodium 2mg  in the morning and as needed throughout the day.

## 2013-03-24 ENCOUNTER — Telehealth: Payer: Self-pay | Admitting: *Deleted

## 2013-03-24 NOTE — Telephone Encounter (Signed)
Pt's mother called asking for an appointment for pt today. Pt c/o SOB related to allergies.  EMS was called this AM by pt but when they arrived they told pt to call PCP. We are not able to get pt in clinic today or tomorrow so advised evaluation at [facility]. Mother voices understanding

## 2013-03-24 NOTE — Telephone Encounter (Signed)
Agree with the plan.  Thanks Dr. Dierdre Searles

## 2013-03-25 ENCOUNTER — Emergency Department (INDEPENDENT_AMBULATORY_CARE_PROVIDER_SITE_OTHER)
Admission: EM | Admit: 2013-03-25 | Discharge: 2013-03-25 | Disposition: A | Discharge status: Home / Self Care | Payer: Medicare Other | Source: Home / Self Care | Attending: Emergency Medicine | Admitting: Emergency Medicine

## 2013-03-25 ENCOUNTER — Encounter (HOSPITAL_COMMUNITY): Payer: Self-pay | Admitting: Emergency Medicine

## 2013-03-25 DIAGNOSIS — J309 Allergic rhinitis, unspecified: Secondary | ICD-10-CM

## 2013-03-25 DIAGNOSIS — J019 Acute sinusitis, unspecified: Secondary | ICD-10-CM

## 2013-03-25 MED ORDER — AMOXICILLIN 500 MG PO CAPS: 1000.0000 mg | ORAL_CAPSULE | Freq: Three times a day (TID) | ORAL | Status: DC

## 2013-03-25 MED ORDER — METHYLPREDNISOLONE ACETATE 80 MG/ML IJ SUSP: 80.0000 mg | Freq: Once | INTRAMUSCULAR | Status: DC

## 2013-03-25 MED ORDER — FEXOFENADINE-PSEUDOEPHED ER 60-120 MG PO TB12: 1.0000 | ORAL_TABLET | Freq: Two times a day (BID) | ORAL | Status: DC

## 2013-03-25 MED ORDER — FLUTICASONE PROPIONATE 50 MCG/ACT NA SUSP: 2.0000 | Freq: Every day | NASAL | Status: DC

## 2013-03-25 NOTE — ED Notes (Signed)
Pt declined depo-medrol Dr. Lorenz Coaster has been made aware Cancelled depo-medrol 80mg 

## 2013-03-25 NOTE — Discharge Instructions (Signed)
People who suffer from allergies frequently have symptoms of nasal congestion, runny nose, sneezing, itching of the nose, eyes, ears or throat, mucous in the throat, watering of the eyes and cough.  These symptoms are caused by the body's immune response to environmental allergens.  For seasonal allergies this is pollen (tree pollen in the spring, grass pollen in the summer, and weed pollen in the fall).  Year round allergy symptoms are usually caused by dust or mould.  Many people have year round symptoms which are worse seasonally. ° °For people who have seasonal allergies, pollen avoidance may help to decease symptoms.  This means keeping windows in the house down and windows in the car up.  Run your air conditioning, since this filters out many of the pollen particles.  If you have to spend a prolonged time outdoors during heavy pollen season, it might be prudent to wear a mask.  These can be purchased at any drug store.  When you come in after heavy pollen exposure, your skin, clothing and hair are covered with pollen.  Changing your clothing, taking a shower, and washing your hair may help with your pollen exposure.  Also, your bedding, pillow, and pillowcase may become contaminated with pollen, so frequent washing of your bedding and pillowcase and changing out your pillow may help as well.  (Your pillow can also be a source of dust and mould exposure as well.)  Showering at bedtime may also help. ° °During heavy pollen season (April and September), a large amount of pollen gets trapped in your nasal cavity.  This can contribute to ongoing allergy symptoms.  Saline irrigation of the nasal cavity can help to remove this and relieve allergy symptoms.  This can be accomplished in several ways.  You can mix up your own saline solution using the following recipe:  8 oz of distilled or boiled water, 1/2 tsp of table salt (sodium choride), and a pinch of baking soda (sodium bicarbonate).  If nasal congestion is a  problem.  1 to 2 drops of Afrin solution can be added to this as well.  To do the irrigation, purchase a nasal bulb syringe (the kind you would use to clean out an infant's nose).  Fill this up with the solution, lean you head over a sink with the nostril to be irrigated turned upward, insert the syringe into your nostril, making a tight seal, and gently irrigate, compressing the bulb.  The solution will flow into your nostril and out the other, some may also come out of your mouth.  Repeat this on both sides.  You can do this once daily.  Do not store the solution, mix it up fresh each day.  A commercial solution, called Neomed Solution, can be purchased over the counter without prescription.  You can also use a Netti Pot for irrigation.  These can be purchased at your drug store as well.  Be sure to use distilled or boiled water in these as well and make sure the Netti pot is completely dry between uses. ° °Over the counter medications can be helpful, and in many cases can completely control allergy symptoms without resorting to more expensive prescription meds.   °Antihistamines are the mainstay of allergy treatment.  The newer non-sedating antihistamines are all available over the counter.  These include Allegra, Zyrtec, and Claritin which also can be purchased in their generic forms: fexofenadine, cetirizine, and  Cetirizine.  Combining these meds with a decongestant such as pseudoephedrine or   phenylephrine helps with nasal congestion, but decongestants can also cause elevations in blood pressure.  Pseudoephedrine tends to be more effective than phenylephrine.  The older, more sedating antihistamines such as chlorpheniramine, brompheniramine, and diphenhydramine are also very effective, sometimes more so than the newer antihistamines, but with the price of more sedation.  You should be careful about driving or operating heavy machinery when taking sedating antihistamines, and men with enlarged prostates may  experience urinary retention with diphenhydramine. ° °Naslacrom nasal spray can be very effective for allergy symptoms.  It is available over the counter and has very few side effects.  The dosage is 2 sprays in each nostril twice daily.  It is recommended that you pinch your nose shut for 30 seconds after using it since it is a watery spray and can run out.  It can be used as long as needed.  There is no risk of dependency. ° °For people with year round allergies, dust, mould, insect emanations, and pet dander are usually the culprits. ° °To avoid dust, you need to avoid dust mites which are the main source of allergens in house dust.  Cover your bedding with moisture and mite impervious covers.  These can be purchased at any mattress store.  The modern covers are a little expensive, but not at all uncomfortable. Keeping your house as dry as possible will also help to control dust mites.  Do not use a humidifier and it may help to use a dehumidifier.  Use of a HEPA filter air filter is also a great way to reduce dust and mold exposure.  These units can be purchased commercially.  Make sure to buy one large enough for the room you intend to use it.  Change the filter as per the manufacturer's instructions.  Also, using a HEPA filter vacuum for your carpets is helpful.  There are chemicals that you can sprinkle on your carpet called acaricides that will kill dist mites.  The most commonly used brand is Acarosan.  This can be purchased on line.  It does have to be periodically reapplied.  Wash you pillows and bedsheets regularly in hot water. ° ° ° ° ° ° ° ° ° °

## 2013-03-25 NOTE — ED Notes (Signed)
Pt c/o cold sx onset yest Sx include: sneezing, nasal congestion, headache, sore throat, cough, vomiting Denies: fevers, itchy/watery eyes Hx of allergies to pollen and asthma  She is alert and oriented w/no signs of acute distress.

## 2013-03-25 NOTE — ED Provider Notes (Signed)
Chief Complaint:   Chief Complaint  Patient presents with  . URI    History of Present Illness:   [name] is a 27 year old female with multiple psychiatric issues who has had a two-day history of sneezing, nasal itching, nasal congestion with green drainage, headache, sore throat, cough, she vomited once, and she's had chronic abdominal pain for which she is seeing a gastroenterologist and has a colonoscopy scheduled. The patient states that she has had allergies for years usually worse in the spring and summer. She's not taking anything for this right now. She has asthma for which she takes albuterol. She denies any wheezing. She's had no fever or chills.  Review of Systems:  Other than noted above, the patient denies any of the following symptoms: Systemic:  No fevers, chills, sweats, weight loss or gain, fatigue, or tiredness. Eye:  No redness or discharge. ENT:  No ear pain, drainage, headache, nasal congestion, drainage, sinus pressure, difficulty swallowing, or sore throat. Neck:  No neck pain or swollen glands. Lungs:  No cough, sputum production, hemoptysis, wheezing, chest tightness, shortness of breath or chest pain. GI:  No abdominal pain, nausea, vomiting or diarrhea.  PMFSH:  Past medical history, family history, social history, meds, and allergies were reviewed. She is allergic to Ritalin and Depakote. She takes Abilify, Tegretol, Celexa, Flonase, Nuedexta, Protonix, Phenergan, and trazodone  Physical Exam:   Vital signs:  There were no vitals taken for this visit. General:  Alert and oriented.  In no distress.  Skin warm and dry. Eye:  No conjunctival injection or drainage. Lids were normal. ENT:  TMs and canals were normal, without erythema or inflammation.  Nasal mucosa was clear and uncongested, without drainage.  Mucous membranes were moist.  Pharynx was clear with no exudate or drainage.  There were no oral ulcerations or lesions. Neck:  Supple, no adenopathy,  tenderness or mass. Lungs:  No respiratory distress.  Lungs were clear to auscultation, without wheezes, rales or rhonchi.  Breath sounds were clear and equal bilaterally.  Heart:  Regular rhythm, without gallops, murmers or rubs. Skin:  Clear, warm, and dry, without rash or lesions.  Assessment:  The primary encounter diagnosis was Allergic rhinitis. A diagnosis of Acute sinusitis was also pertinent to this visit.  Plan:   1.  The following meds were prescribed:   Discharge Medication List as of 03/25/2013  2:17 PM    START taking these medications   Details  amoxicillin (AMOXIL) 500 MG capsule Take 2 capsules (1,000 mg total) by mouth 3 (three) times daily., Starting 03/25/2013, Until Discontinued, Normal    fexofenadine-pseudoephedrine (ALLEGRA-D) 60-120 MG per tablet Take 1 tablet by mouth every 12 (twelve) hours., Starting 03/25/2013, Until Discontinued, Normal    fluticasone (FLONASE) 50 MCG/ACT nasal spray Place 2 sprays into the nose daily., Starting 03/25/2013, Until Discontinued, Normal       2.  The patient was instructed in symptomatic care and handouts were given. 3.  The patient was told to return if becoming worse in any way, if no better in 3 or 4 days, and given some red flag symptoms such as fever or difficulty breathing that would indicate earlier return.      Reuben Likes, MD 03/25/13 [phone]

## 2013-03-25 NOTE — ED Notes (Signed)
Pt was very inpatient and kept asking when she would be d/c Mother was also very inpatient and upon being d/c, mother stated: "she wishes that I would one day find a profession that I enjoyed"

## 2013-03-30 ENCOUNTER — Telehealth: Payer: Self-pay | Admitting: Internal Medicine

## 2013-03-30 MED ORDER — PEG-KCL-NACL-NASULF-NA ASC-C 100 G PO SOLR: 1.0000 | Freq: Once | ORAL | Status: DC

## 2013-03-30 NOTE — Telephone Encounter (Signed)
moviprep sent into pt's pharmacy

## 2013-04-03 ENCOUNTER — Emergency Department (HOSPITAL_COMMUNITY)
Admission: EM | Admit: 2013-04-03 | Discharge: 2013-04-03 | Disposition: A | Discharge status: Home / Self Care | Payer: Medicare Other | Attending: Emergency Medicine | Admitting: Emergency Medicine

## 2013-04-03 ENCOUNTER — Encounter (HOSPITAL_COMMUNITY): Payer: Self-pay | Admitting: *Deleted

## 2013-04-03 DIAGNOSIS — J45901 Unspecified asthma with (acute) exacerbation: Secondary | ICD-10-CM | POA: Insufficient documentation

## 2013-04-03 DIAGNOSIS — Z87440 Personal history of urinary (tract) infections: Secondary | ICD-10-CM | POA: Insufficient documentation

## 2013-04-03 DIAGNOSIS — F411 Generalized anxiety disorder: Secondary | ICD-10-CM | POA: Insufficient documentation

## 2013-04-03 DIAGNOSIS — R0602 Shortness of breath: Secondary | ICD-10-CM | POA: Insufficient documentation

## 2013-04-03 DIAGNOSIS — L293 Anogenital pruritus, unspecified: Secondary | ICD-10-CM | POA: Insufficient documentation

## 2013-04-03 DIAGNOSIS — F909 Attention-deficit hyperactivity disorder, unspecified type: Secondary | ICD-10-CM | POA: Insufficient documentation

## 2013-04-03 DIAGNOSIS — R3915 Urgency of urination: Secondary | ICD-10-CM | POA: Insufficient documentation

## 2013-04-03 DIAGNOSIS — Z79899 Other long term (current) drug therapy: Secondary | ICD-10-CM | POA: Insufficient documentation

## 2013-04-03 DIAGNOSIS — F319 Bipolar disorder, unspecified: Secondary | ICD-10-CM | POA: Insufficient documentation

## 2013-04-03 DIAGNOSIS — R300 Dysuria: Secondary | ICD-10-CM | POA: Insufficient documentation

## 2013-04-03 DIAGNOSIS — N898 Other specified noninflammatory disorders of vagina: Secondary | ICD-10-CM | POA: Insufficient documentation

## 2013-04-03 NOTE — ED Notes (Signed)
AVW:[id number]<XB> Expected date:04/03/13<BR> Expected time: 9:32 AM<BR> Means of arrival:Ambulance<BR> Comments:<BR> UTI, shortness of breath

## 2013-04-03 NOTE — ED Notes (Signed)
Pt came up to the desk and said "I want to leave."  I asked her why and she just shrugged her shoulders.  Would not give a reason.  Pt signed out AMA.  Pt went to 3rd floor to visit someone.

## 2013-04-03 NOTE — ED Provider Notes (Signed)
History     CSN: [id number]  Arrival date & time 04/03/13  0940   First MD Initiated Contact with Patient 04/03/13 [phone]      Chief Complaint  Patient presents with  . Shortness of Breath  . Dysuria    (Consider location/radiation/quality/duration/timing/severity/associated sxs/prior treatment) HPI  27 year old female with history of bipolar, attention deficit, and anxiety presents complaining of shortness of breath and dysuria. Patient also has a history of recurrent urinary tract infection. Patient reports for the past 5 days she has been having burning urination, with urinary urgency. She also report having vaginal discharge with whitish discharge. Complaining of itching sensation in her vagina. This morning she reports having some shortness of breath however that has resolved after using her inhaler she denies any fever, chills, nausea, vomiting, diarrhea, back pain, abdominal pain, or rash. No prior history of STD. Last sexual activities was 2 weeks ago, unprotected.  Past Medical History  Diagnosis Date  . Bipolar disorder   . Attention deficit hyperactivity disorder   . Depression   . Anxiety   . Asthma   . UTI (urinary tract infection)     Completed Cipro 08/06/12    Past Surgical History  Procedure Laterality Date  . Mouth surgery    . Eye muscle surgery Bilateral     07/29/12    Family History  Problem Relation Age of Onset  . Depression Mother   . Depression Father   . Depression Brother   . Colon cancer Mother   . Colon polyps Mother   . Liver disease    . Kidney disease      History  Substance Use Topics  . Smoking status: Never Smoker   . Smokeless tobacco: Never Used  . Alcohol Use: No    OB History   Grav Para Term Preterm Abortions TAB SAB Ect Mult Living   0 0              Review of Systems  Constitutional: Negative for fever.       10 Systems reviewed and all are negative for acute change except as noted in the HPI.   Gastrointestinal:  Negative for abdominal pain.  Genitourinary: Positive for dysuria.  Skin: Negative for rash.    Allergies  Depakote; Methylphenidate derivatives; and Ritalin  Home Medications   Current Outpatient Rx  Name  Route  Sig  Dispense  Refill  . amoxicillin (AMOXIL) 500 MG capsule   Oral   Take 2 capsules (1,000 mg total) by mouth 3 (three) times daily.   60 capsule   0     Dispense as written.   . ARIPiprazole (ABILIFY) 30 MG tablet   Oral   Take 30 mg by mouth daily.         . carbamazepine (TEGRETOL) 200 MG tablet   Oral   Take 200-400 mg by mouth 2 (two) times daily. Take 1 tablet (200mg ) in the morning Take 2 tablets (400mg ) at bedtime         . citalopram (CELEXA) 10 MG tablet   Oral   Take 10 mg by mouth daily. Take along with 20mg  tablet to make a total of 30mg .         . citalopram (CELEXA) 20 MG tablet   Oral   Take 20 mg by mouth daily. Take along with 10mg  to make a total of 30mg .         . fexofenadine-pseudoephedrine (ALLEGRA-D) 60-120 MG per tablet  Oral   Take 1 tablet by mouth every 12 (twelve) hours.   30 tablet   3   . loperamide (IMODIUM A-D) 2 MG tablet   Oral   Take 1 tablet (2 mg total) by mouth 4 (four) times daily as needed for diarrhea or loose stools.   30 tablet   0   . NUEDEXTA 20-10 MG CAPS               . peg 3350 powder (MOVIPREP) 100 G SOLR   Oral   Take 1 kit (100 g total) by mouth once.   1 kit   0   . trazodone (DESYREL) 300 MG tablet   Oral   Take 300 mg by mouth at bedtime. For insomnia.           There were no vitals taken for this visit.  Physical Exam  Nursing note and vitals reviewed. Constitutional: She is oriented to person, place, and time. She appears well-developed and well-nourished. No distress.  Awake, alert, nontoxic appearance  HENT:  Head: Atraumatic.  Eyes: Conjunctivae are normal. Right eye exhibits no discharge. Left eye exhibits no discharge.  Neck: Neck supple.  Cardiovascular:  Normal rate and regular rhythm.   Pulmonary/Chest: Effort normal. No respiratory distress. She exhibits no tenderness.  Abdominal: Soft. There is no tenderness. There is no rebound.  Musculoskeletal: She exhibits no tenderness.  ROM appears intact, no obvious focal weakness  Neurological: She is alert and oriented to person, place, and time.  Mental status and motor strength appears intact  Skin: No rash noted.  Psychiatric: She has a normal mood and affect.    ED Course  Procedures (including critical care time)   Patient complains of dysuria, pregnancy test and UA ordered. Abdomen nontender on exam. Patient also reports having vaginal discharge and itchiness in her vagina, is sexually active. Will perform pelvic exam further evaluation. Patient did report having some increased shortness of breath this morning, has history of asthma, uses her inhaler and this was of breath has resolved. Her lung exam is unremarkable. She is afebrile with stable normal vital signs  10:14 AM  patient requests to be discharged at this time. No work up has initiated it. I explained the risks of an undiagnosed urinary tract infection or STD, however patient would like to leave.  Pt will sign AMA form.  Return precaution discussed. Pt is mentating appropriately and able to make clinical decision.  Labs Reviewed  URINALYSIS, ROUTINE W REFLEX MICROSCOPIC  PREGNANCY, URINE   No results found.   1. Dysuria       MDM  BP 106/65  Pulse 66  Temp(Src) 98.4 F (36.9 C) (Oral)  Wt 233 lb (105.688 kg)  BMI 39.06 kg/m2  SpO2 96%  I have reviewed nursing notes and vital signs. I personally reviewed the imaging tests through PACS system  I reviewed available ER/hospitalization records thought the EMR         Fayrene Helper, [name] 04/03/13 1028

## 2013-04-03 NOTE — Discharge Instructions (Signed)
Discharge Against Medical Advice I am signing this paper to show that I am leaving this hospital or health care center of my own free will. It is done against all medical advice. In doing so, I am releasing this hospital or health care center and the attending physicians from any and all claims that I may want to make. I understand that further care has been recommended. My condition may worsen. This could cause me further bodily injury, illness, or even death. I do know that the medical staff has fully explained to me the risk that I am taking in leaving against medical advice. Document Released: 12/01/2005 Document Revised: 02/23/2012 Document Reviewed: 05/18/2007 [facility] Patient Information 2013 [place], [place].

## 2013-04-03 NOTE — ED Notes (Addendum)
EMS reports pt woke at 0645 with dyspnea, used inhaler, no s/s of distress, talkative throughout transport without distress, 3 days of urinary burning symptoms. Pt texting and talking the entire transport.

## 2013-04-08 ENCOUNTER — Telehealth: Payer: Self-pay | Admitting: *Deleted

## 2013-04-08 ENCOUNTER — Encounter: Payer: Self-pay | Admitting: Internal Medicine

## 2013-04-08 ENCOUNTER — Ambulatory Visit (INDEPENDENT_AMBULATORY_CARE_PROVIDER_SITE_OTHER): Payer: Medicare Other | Admitting: Internal Medicine

## 2013-04-08 ENCOUNTER — Other Ambulatory Visit (HOSPITAL_COMMUNITY)
Admission: RE | Admit: 2013-04-08 | Discharge: 2013-04-08 | Disposition: A | Discharge status: Home / Self Care | Payer: Medicare Other | Source: Ambulatory Visit | Attending: Internal Medicine | Admitting: Internal Medicine

## 2013-04-08 VITALS — BP 110/82 | HR 90 | Temp 98.7°F | Wt 234.5 lb

## 2013-04-08 DIAGNOSIS — N898 Other specified noninflammatory disorders of vagina: Secondary | ICD-10-CM | POA: Insufficient documentation

## 2013-04-08 DIAGNOSIS — Z113 Encounter for screening for infections with a predominantly sexual mode of transmission: Secondary | ICD-10-CM | POA: Insufficient documentation

## 2013-04-08 DIAGNOSIS — N760 Acute vaginitis: Secondary | ICD-10-CM | POA: Insufficient documentation

## 2013-04-08 LAB — POCT URINE PREGNANCY: Preg Test, Ur: NEGATIVE

## 2013-04-08 NOTE — Patient Instructions (Signed)
**  I will call you with the results from your testing done today. The reuslts will not be back until late tomorrow, so I will likely call you on Monday. If you need to be treated, I will call something in for you.
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
## 2013-04-08 NOTE — Progress Notes (Signed)
Case discussed with Dr. Glenn at the time of the visit, immediately after the resident saw the patient.  I reviewed the resident's history and exam and pertinent patient test results.  I agree with the assessment, diagnosis and plan of care documented in the resident's note.  

## 2013-04-08 NOTE — ED Provider Notes (Signed)
Medical screening examination/treatment/procedure(s) were performed by non-physician practitioner and as supervising physician I was immediately available for consultation/collaboration.  Hurman Horn, MD 04/08/13 0010

## 2013-04-08 NOTE — Telephone Encounter (Signed)
Pt called with c/o vaginal itching and white discharge.  Onset 2 weeks ago. No known cause. Will see today for evaluation.

## 2013-04-12 ENCOUNTER — Ambulatory Visit (AMBULATORY_SURGERY_CENTER): Payer: Medicare Other | Admitting: Internal Medicine

## 2013-04-12 ENCOUNTER — Encounter: Payer: Self-pay | Admitting: Internal Medicine

## 2013-04-12 VITALS — BP 107/62 | HR 59 | Temp 97.6°F | Resp 15 | Ht 64.0 in | Wt 229.0 lb

## 2013-04-12 DIAGNOSIS — K625 Hemorrhage of anus and rectum: Secondary | ICD-10-CM

## 2013-04-12 DIAGNOSIS — K219 Gastro-esophageal reflux disease without esophagitis: Secondary | ICD-10-CM

## 2013-04-12 DIAGNOSIS — D131 Benign neoplasm of stomach: Secondary | ICD-10-CM

## 2013-04-12 DIAGNOSIS — R109 Unspecified abdominal pain: Secondary | ICD-10-CM

## 2013-04-12 DIAGNOSIS — R197 Diarrhea, unspecified: Secondary | ICD-10-CM

## 2013-04-12 DIAGNOSIS — K6389 Other specified diseases of intestine: Secondary | ICD-10-CM

## 2013-04-12 DIAGNOSIS — R112 Nausea with vomiting, unspecified: Secondary | ICD-10-CM

## 2013-04-12 DIAGNOSIS — K2970 Gastritis, unspecified, without bleeding: Secondary | ICD-10-CM

## 2013-04-12 MED ORDER — PANTOPRAZOLE SODIUM 40 MG PO TBEC: 40.0000 mg | DELAYED_RELEASE_TABLET | Freq: Every day | ORAL | Status: DC

## 2013-04-12 MED ORDER — SODIUM CHLORIDE 0.9 % IV SOLN: 500.0000 mL | INTRAVENOUS | Status: DC

## 2013-04-12 NOTE — Progress Notes (Signed)
Called to room to assist during endoscopic procedure.  Patient ID and intended procedure confirmed with present staff. Received instructions for my participation in the procedure from the performing physician.  

## 2013-04-12 NOTE — Op Note (Signed)
[facility] [address] [place], [address]   COLONOSCOPY PROCEDURE REPORT  PATIENT: [name], [name]  MR#: [id number] BIRTHDATE: [birth year] , 27  yrs. old GENDER: Female ENDOSCOPIST: Beverley Fiedler, MD PROCEDURE DATE:  04/12/2013 PROCEDURE:   Colonoscopy with biopsy ASA CLASS:   Class II INDICATIONS:unexplained diarrhea, Abdominal pain, Nausea, Vomiting, and Rectal Bleeding. MEDICATIONS: MAC sedation, administered by CRNA and propofol (Diprivan) 400mg  IV  DESCRIPTION OF PROCEDURE:   After the risks benefits and alternatives of the procedure were thoroughly explained, informed consent was obtained.  A digital rectal exam revealed no rectal mass.   The LB CF-H180AL [id number]  endoscope was introduced through the anus and advanced to the terminal ileum which was intubated for a short distance. No adverse events experienced.   The quality of the prep was good, using MoviPrep  The instrument was then slowly withdrawn as the colon was fully examined.      COLON FINDINGS: The mucosa appeared normal in the terminal ileum. The colonic mucosa appeared normal throughout the entire examined colon without evidence for inflammation, diverticulosis, polyps or masses.  Multiple random biopsies of the area were performed. Retroflexed views revealed no abnormalities. The time to cecum=2 minutes 44 seconds.  Withdrawal time=7 minutes 03 seconds.  The scope was withdrawn and the procedure completed.  COMPLICATIONS: There were no complications.  ENDOSCOPIC IMPRESSION: 1.   Normal mucosa in the terminal ileum 2.   The colonic mucosa appeared normal throughout the entire examined colon; multiple random biopsies of the area were performed   RECOMMENDATIONS: 1.  Await pathology results 2.  Loperamide (Imodium) can be used per box instructions to help control diarrhea 3.  Office followup in 3-4 weeks   eSigned:  Beverley Fiedler, MD 04/12/2013 3:02 PM   cc: The Patient  and Dede Query MD, and Dr. Wyn Quaker

## 2013-04-12 NOTE — Patient Instructions (Addendum)
YOU HAD AN ENDOSCOPIC PROCEDURE TODAY AT THE [facility]: Refer to the procedure report that was given to you for any specific questions about what was found during the examination.  If the procedure report does not answer your questions, please call your gastroenterologist to clarify.  If you requested that your care partner not be given the details of your procedure findings, then the procedure report has been included in a sealed envelope for you to review at your convenience later.  YOU SHOULD EXPECT: Some feelings of bloating in the abdomen. Passage of more gas than usual.  Walking can help get rid of the air that was put into your GI tract during the procedure and reduce the bloating. If you had a lower endoscopy (such as a colonoscopy or flexible sigmoidoscopy) you may notice spotting of blood in your stool or on the toilet paper. If you underwent a bowel prep for your procedure, then you may not have a normal bowel movement for a few days.  DIET: Your first meal following the procedure should be a light meal and then it is ok to progress to your normal diet.  A half-sandwich or bowl of soup is an example of a good first meal.  Heavy or fried foods are harder to digest and may make you feel nauseous or bloated.  Likewise meals heavy in dairy and vegetables can cause extra gas to form and this can also increase the bloating.  Drink plenty of fluids but you should avoid alcoholic beverages for 24 hours.  ACTIVITY: Your care partner should take you home directly after the procedure.  You should plan to take it easy, moving slowly for the rest of the day.  You can resume normal activity the day after the procedure however you should NOT DRIVE or use heavy machinery for 24 hours (because of the sedation medicines used during the test).    SYMPTOMS TO REPORT IMMEDIATELY: A gastroenterologist can be reached at any hour.  During normal business hours, 8:30 AM to 5:00 PM Monday through Friday,  call [phone].  After hours and on weekends, please call the GI answering service at [phone] who will take a message and have the physician on call contact you.   Following lower endoscopy (colonoscopy or flexible sigmoidoscopy):  Excessive amounts of blood in the stool  Significant tenderness or worsening of abdominal pains  Swelling of the abdomen that is new, acute  Fever of 100F or higher  Following upper endoscopy (EGD)  Vomiting of blood or coffee ground material  New chest pain or pain under the shoulder blades  Painful or persistently difficult swallowing  New shortness of breath  Fever of 100F or higher  Black, tarry-looking stools  FOLLOW UP: If any biopsies were taken you will be contacted by phone or by letter within the next 1-3 weeks.  Call your gastroenterologist if you have not heard about the biopsies in 3 weeks.  Our staff will call the home number listed on your records the next business day following your procedure to check on you and address any questions or concerns that you may have at that time regarding the information given to you following your procedure. This is a courtesy call and so if there is no answer at the home number and we have not heard from you through the emergency physician on call, we will assume that you have returned to your regular daily activities without incident.  SIGNATURES/CONFIDENTIALITY: You and/or your care   partner have signed paperwork which will be entered into your electronic medical record.  These signatures attest to the fact that that the information above on your After Visit Summary has been reviewed and is understood.  Full responsibility of the confidentiality of this discharge information lies with you and/or your care-partner.  Esophagitis, gastritis-handouts given  Trial pantoprazole 40 mg by mouth, take 30 mins to 1 hr before breakfast, for acid suppression given erosive gastritis  Wait  pathology  Loperamide (Imodium) can be used per box instructions to help control diarrhea  Follow-up in 3-4 weeks, if office has not called by 5/2 please call office to schedule

## 2013-04-12 NOTE — Op Note (Signed)
[facility] [address] [place], [address]   ENDOSCOPY PROCEDURE REPORT  PATIENT: Nichole, [name]  MR#: [id number] BIRTHDATE: 11/12/1986 , 27  yrs. old GENDER: Female ENDOSCOPIST: Beverley Fiedler, MD PROCEDURE DATE:  04/12/2013 PROCEDURE:  EGD w/ biopsy for H.pylori ASA CLASS:     Class II INDICATIONS:  Nausea.   Vomiting.   Unexplained diarrhea. abdominal pain. MEDICATIONS: MAC sedation, administered by CRNA and propofol (Diprivan) 400mg  IV TOPICAL ANESTHETIC: none  DESCRIPTION OF PROCEDURE: After the risks benefits and alternatives of the procedure were thoroughly explained, informed consent was obtained.  The LB GIF-H180 [id number] endoscope was introduced through the mouth and advanced to the second portion of the duodenum. Without limitations.  The instrument was slowly withdrawn as the mucosa was fully examined.     ESOPHAGUS: There was LA Class B esophagitis noted at GE junction, with otherwise normal-appearing esophageal mucosa  STOMACH: Mild erosive gastritis (inflammation) was found in the gastric antrum and prepyloric region of the stomach.  Biopsies were taken in the antrum and angularis.  DUODENUM: The duodenal mucosa showed no abnormalities in the bulb and second portion of the duodenum.  Retroflexed views revealed no abnormalities.     The scope was then withdrawn from the patient and the procedure completed.  COMPLICATIONS: There were no complications. ENDOSCOPIC IMPRESSION: 1.   There was LA Class B esophagitis noted 2.   Erosive gastritis (inflammation) was found in the gastric antrum and prepyloric region of the stomach; biopsies were taken in the antrum and angularis 3.   The duodenal mucosa showed no abnormalities in the bulb and second portion of the duodenum  RECOMMENDATIONS: 1.  Trial of pantoprazole 40 mg daily (best taken 30 minutes to 1 hour before breakfast) for acid suppression given erosive gastritis. 2.  Await  pathology results 3.  Follow-up of helicobacter pylori status, treat if indicated  eSigned:  Beverley Fiedler, MD 04/12/2013 2:58 PM CC:The Patient and Dede Query MD  and Dr. Wyn Quaker

## 2013-04-12 NOTE — Progress Notes (Signed)
Patient did not experience any of the following events: a burn prior to discharge; a fall within the facility; wrong site/side/patient/procedure/implant event; or a hospital transfer or hospital admission upon discharge from the facility. (G8907) Patient did not have preoperative order for IV antibiotic SSI prophylaxis. (G8918)  

## 2013-04-13 ENCOUNTER — Telehealth: Payer: Self-pay | Admitting: *Deleted

## 2013-04-13 MED ORDER — FLUCONAZOLE 150 MG PO TABS: 150.0000 mg | ORAL_TABLET | Freq: Once | ORAL | Status: DC

## 2013-04-13 MED ORDER — METRONIDAZOLE 500 MG PO TABS: 500.0000 mg | ORAL_TABLET | Freq: Two times a day (BID) | ORAL | Status: DC

## 2013-04-13 NOTE — Telephone Encounter (Signed)
  Follow up Call-  Call back number 04/12/2013  Post procedure Call Back phone  # [phone]  Permission to leave phone message Yes     Patient questions:  Do you have a fever, pain , or abdominal swelling? no Pain Score  0 *  Have you tolerated food without any problems? yes  Have you been able to return to your normal activities? yes  Do you have any questions about your discharge instructions: Diet   no Medications  no Follow up visit  no  Do you have questions or concerns about your Care? no  Actions: * If pain score is 4 or above: No action needed, pain <4.

## 2013-04-13 NOTE — Addendum Note (Signed)
Addended by: Charlsie Merles F on: 04/13/2013 10:14 AM   Modules accepted: Orders

## 2013-04-13 NOTE — Progress Notes (Signed)
Patient ID: [name], female   DOB: 06-12-1986, 27 y.o.   MRN: [id number]  Wet prep is positive for Candida and Gardnerella. Called patient and spoke with her, and also spoke to her mother at the patient's request, and discussed her lab results and her treatment.  E-prescribing fluconazole 150 mg by mouth x1, and metronidazole 500 mg by mouth twice a day x7 days.

## 2013-04-15 ENCOUNTER — Other Ambulatory Visit: Payer: Self-pay | Admitting: Gastroenterology

## 2013-04-18 ENCOUNTER — Other Ambulatory Visit: Payer: Self-pay | Admitting: Gastroenterology

## 2013-04-19 ENCOUNTER — Telehealth: Payer: Self-pay | Admitting: Gastroenterology

## 2013-04-19 ENCOUNTER — Encounter: Payer: Self-pay | Admitting: Internal Medicine

## 2013-04-19 ENCOUNTER — Other Ambulatory Visit: Payer: Self-pay | Admitting: Gastroenterology

## 2013-04-19 NOTE — Telephone Encounter (Signed)
lvm on [place] (mother) cell phone which is the only number we have, to let pt know I am starting a prior auth for protonix.

## 2013-04-20 ENCOUNTER — Telehealth: Payer: Self-pay | Admitting: *Deleted

## 2013-04-20 NOTE — Telephone Encounter (Signed)
Call from pt's mother - Stated pt continues to cough,congested; tried Allegra-D, cough syrup w/no improvement. Stated might be her allergies. Requesting to be seen today; no available appts - suggested Urgent Care but request to be seen tomorrow. Appt scheduled tomorrow at 1445PM w/Dt Allena Katz. Informed her mother, if condition worsens, to go to [facility] [place] or ED - she agreed.

## 2013-04-20 NOTE — Telephone Encounter (Signed)
Ok, thanks.

## 2013-04-21 ENCOUNTER — Encounter: Payer: Self-pay | Admitting: Internal Medicine

## 2013-04-21 ENCOUNTER — Ambulatory Visit (INDEPENDENT_AMBULATORY_CARE_PROVIDER_SITE_OTHER): Payer: Medicare Other | Admitting: Internal Medicine

## 2013-04-21 VITALS — BP 118/92 | HR 75 | Temp 98.4°F | Ht 64.5 in | Wt 235.9 lb

## 2013-04-21 DIAGNOSIS — J309 Allergic rhinitis, unspecified: Secondary | ICD-10-CM | POA: Insufficient documentation

## 2013-04-21 MED ORDER — PSEUDOEPH-CHLORPHEN-HYDROCOD 60-4-5 MG/5ML PO SOLN: 5.0000 mL | Freq: Three times a day (TID) | ORAL | Status: DC | PRN

## 2013-04-21 NOTE — Progress Notes (Signed)
  Subjective:    Patient ID: [name], female    DOB: 01/20/1986, 27 y.o.   MRN: [id number]  HPI patient is a 27 year woman with bipolar affective disorder, GERD and other problems as her problem list who comes the clinic for cough for past 5 weeks.  Patient describes having cough with yellowish sputum production and sinus congestion for past 5 weeks. She was evaluated in urgent care on 03/25/2013 and was prescribed amoxicillin, Allegra-D and Flonase. Her symptoms got better for a while and then started getting worse. Now she has coughing spells which is up with vomiting.  She denies any fever, chills, headache, vision changes, earache, ear discharge.  She does have throat pain but says that happens after coughing spell.    Review of Systems    as per history of present illness. Objective:   Physical Exam  General: NAD HEENT: PERRL, EOMI, no scleral icterus Cardiac: S1, S2, RRR, no rubs, murmurs or gallops Pulm: clear to auscultation bilaterally, moving normal volumes of air Abd: soft, nontender, nondistended, BS present Ext: warm and well perfused, no pedal edema Neuro: alert and oriented X3, cranial nerves II-XII grossly intact       Assessment & Plan:

## 2013-04-21 NOTE — Patient Instructions (Signed)
Please make a followup appointment as needed.  Take the cough suppressant syrup- 5 ML 3 times a day as needed.  Continue taking Allegra and using Flonase.  If the symptoms gets worse or does not improve in next 1-2 weeks, gives a call to make an early appointment.

## 2013-04-21 NOTE — Assessment & Plan Note (Signed)
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 Completed amoxicillin course in April- which helped for a while and then symptoms got worse. Inadequate cough suppression.  - Continue antihistamine, Flonase and add pseudoephedrine/chlorpheniramine/hydrocodone cough syrup- which was only one re-imbursed by Medicare. Patient and her mom says that did not have money to get over-the-counter medications. - No need for imaging or further antibiotics at this point in time. - Return office visit as needed.

## 2013-04-22 NOTE — Progress Notes (Signed)
Case discussed with Dr. Patel immediately after the resident saw the patient.  We reviewed the resident's history and exam and pertinent patient test results.  I agree with the assessment, diagnosis and plan of care documented in the resident's note. 

## 2013-04-26 ENCOUNTER — Institutional Professional Consult (permissible substitution): Payer: Self-pay | Admitting: Pulmonary Disease

## 2013-05-04 ENCOUNTER — Encounter: Payer: Self-pay | Admitting: Internal Medicine

## 2013-05-05 ENCOUNTER — Ambulatory Visit: Payer: Self-pay | Admitting: Internal Medicine

## 2013-05-21 DIAGNOSIS — J45909 Unspecified asthma, uncomplicated: Secondary | ICD-10-CM | POA: Insufficient documentation

## 2013-05-21 DIAGNOSIS — Z79899 Other long term (current) drug therapy: Secondary | ICD-10-CM | POA: Insufficient documentation

## 2013-05-23 ENCOUNTER — Institutional Professional Consult (permissible substitution): Payer: Self-pay | Admitting: Pulmonary Disease

## 2013-05-28 ENCOUNTER — Encounter (HOSPITAL_COMMUNITY): Payer: Self-pay | Admitting: Emergency Medicine

## 2013-05-28 ENCOUNTER — Emergency Department (HOSPITAL_COMMUNITY)
Admission: EM | Admit: 2013-05-28 | Discharge: 2013-05-28 | Disposition: A | Discharge status: Home / Self Care | Payer: Medicare Other | Attending: Emergency Medicine | Admitting: Emergency Medicine

## 2013-05-28 DIAGNOSIS — J45909 Unspecified asthma, uncomplicated: Secondary | ICD-10-CM | POA: Insufficient documentation

## 2013-05-28 DIAGNOSIS — Z8719 Personal history of other diseases of the digestive system: Secondary | ICD-10-CM | POA: Insufficient documentation

## 2013-05-28 DIAGNOSIS — Z79899 Other long term (current) drug therapy: Secondary | ICD-10-CM | POA: Insufficient documentation

## 2013-05-28 DIAGNOSIS — K625 Hemorrhage of anus and rectum: Secondary | ICD-10-CM | POA: Insufficient documentation

## 2013-05-28 DIAGNOSIS — F319 Bipolar disorder, unspecified: Secondary | ICD-10-CM | POA: Insufficient documentation

## 2013-05-28 DIAGNOSIS — F411 Generalized anxiety disorder: Secondary | ICD-10-CM | POA: Insufficient documentation

## 2013-05-28 DIAGNOSIS — Z87440 Personal history of urinary (tract) infections: Secondary | ICD-10-CM | POA: Insufficient documentation

## 2013-05-28 DIAGNOSIS — F909 Attention-deficit hyperactivity disorder, unspecified type: Secondary | ICD-10-CM | POA: Insufficient documentation

## 2013-05-28 DIAGNOSIS — K921 Melena: Secondary | ICD-10-CM | POA: Insufficient documentation

## 2013-05-28 DIAGNOSIS — IMO0002 Reserved for concepts with insufficient information to code with codable children: Secondary | ICD-10-CM | POA: Insufficient documentation

## 2013-05-28 NOTE — Discharge Instructions (Signed)
You have deferred your rectal exam--follow up with your doctor Bloody Stools Bloody stools often mean that there is a problem in the digestive tract. Your caregiver may use the term "melena" to describe black, tarry, and bad smelling stools or "hematochezia" to describe red or maroon-colored stools. Blood seen in the stool can be caused by bleeding anywhere along the intestinal tract.  A black stool usually means that blood is coming from the upper part of the gastrointestinal tract (esophagus, stomach, or small bowel). Passing maroon-colored stools or bright red blood usually means that blood is coming from lower down in the large bowel or the rectum. However, sometimes massive bleeding in the stomach or small intestine can cause bright red bloody stools.  Consuming black licorice, lead, iron pills, medicines containing bismuth subsalicylate, or blueberries can also cause black stools. Your caregiver can test black stools to see if blood is present. It is important that the cause of the bleeding be found. Treatment can then be started, and the problem can be corrected. Rectal bleeding may not be serious, but you should not assume everything is okay until you know the cause.It is very important to follow up with your caregiver or a specialist in gastrointestinal problems. CAUSES  Blood in the stools can come from various underlying causes.Often, the cause is not found during your first visit. Testing is often needed to discover the cause of bleeding in the gastrointestinal tract. Causes range from simple to serious or even life-threatening.Possible causes include:  Hemorrhoids.These are veins that are full of blood (engorged) in the rectum. They cause pain, inflammation, and may bleed.  Anal fissures.These are areas of painful tearing which may bleed. They are often caused by passing hard stool.  Diverticulosis.These are pouches that form on the colon over time, with age, and may bleed  significantly.  Diverticulitis.This is inflammation in areas with diverticulosis. It can cause pain, fever, and bloody stools, although bleeding is rare.  Proctitis and colitis. These are inflamed areas of the rectum or colon. They may cause pain, fever, and bloody stools.  Polyps and cancer. Colon cancer is a leading cause of preventable cancer death.It often starts out as precancerous polyps that can be removed during a colonoscopy, preventing progression into cancer. Sometimes, polyps and cancer may cause rectal bleeding.  Gastritis and ulcers.Bleeding from the upper gastrointestinal tract (near the stomach) may travel through the intestines and produce black, sometimes tarry, often bad smelling stools. In certain cases, if the bleeding is fast enough, the stools may not be black, but red and the condition may be life-threatening. SYMPTOMS  You may have stools that are bright red and bloody, that are normal color with blood on them, or that are dark black and tarry. In some cases, you may only have blood in the toilet bowl. Any of these cases need medical care. You may also have:  Pain at the anus or anywhere in the rectum.  Lightheadedness or feeling faint.  Extreme weakness.  Nausea or vomiting.  Fever. DIAGNOSIS Your caregiver may use the following methods to find the cause of your bleeding:  Taking a medical history. Age is important. Older people tend to develop polyps and cancer more often. If there is anal pain and a hard, large stool associated with bleeding, a tear of the anus may be the cause. If blood drips into the toilet after a bowel movement, bleeding hemorrhoids may be the problem. The color and frequency of the bleeding are additional considerations. In most  cases, the medical history provides clues, but seldom the final answer.  A visual and finger (digital) exam. Your caregiver will inspect the anal area, looking for tears and hemorrhoids. A finger exam can provide  information when there is tenderness or a growth inside. In men, the prostate is also examined.  Endoscopy. Several types of small, long scopes (endoscopes) are used to view the colon.  In the office, your caregiver may use a rigid, or more commonly, a flexible viewing sigmoidoscope. This exam is called flexible sigmoidoscopy. It is performed in 5 to 10 minutes.  A more thorough exam is accomplished with a colonoscope. It allows your caregiver to view the entire 5 to 6 foot long colon. Medicine to help you relax (sedative) is usually given for this exam. Frequently, a bleeding lesion may be present beyond the reach of the sigmoidoscope. So, a colonoscopy may be the best exam to start with. Both exams are usually done on an outpatient basis. This means the patient does not stay overnight in the hospital or surgery center.  An upper endoscopy may be needed to examine your stomach. Sedation is used and a flexible endoscope is put in your mouth, down to your stomach.  A barium enema X-ray. This is an X-ray exam. It uses liquid barium inserted by enema into the rectum. This test alone may not identify an actual bleeding point. X-rays highlight abnormal shadows, such as those made by lumps (tumors), diverticuli, or colitis. TREATMENT  Treatment depends on the cause of your bleeding.   For bleeding from the stomach or colon, the caregiver doing your endoscopy or colonoscopy may be able to stop the bleeding as part of the procedure.  Inflammation or infection of the colon can be treated with medicines.  Many rectal problems can be treated with creams, suppositories, or warm baths.  Surgery is sometimes needed.  Blood transfusions are sometimes needed if you have lost a lot of blood.  For any bleeding problem, let your caregiver know if you take aspirin or other blood thinners regularly. HOME CARE INSTRUCTIONS   Take any medicines exactly as prescribed.  Keep your stools soft by eating a diet  high in fiber. Prunes (1 to 3 a day) work well for many people.  Drink enough water and fluids to keep your urine clear or pale yellow.  Take sitz baths if advised. A sitz bath is when you sit in a bathtub with warm water for 10 to 15 minutes to soak, soothe, and cleanse the rectal area.  If enemas or suppositories are advised, be sure you know how to use them. Tell your caregiver if you have problems with this.  Monitor your bowel movements to look for signs of improvement or worsening. SEEK MEDICAL CARE IF:   You do not improve in the time expected.  Your condition worsens after initial improvement.  You develop any new symptoms. SEEK IMMEDIATE MEDICAL CARE IF:   You develop severe or prolonged rectal bleeding.  You vomit blood.  You feel weak or faint.  You have a fever. MAKE SURE YOU:  Understand these instructions.  Will watch your condition.  Will get help right away if you are not doing well or get worse. Document Released: 11/21/2002 Document Revised: 02/23/2012 Document Reviewed: 04/18/2011 [facility] Patient Information 2014 [place], [place].

## 2013-05-28 NOTE — ED Provider Notes (Addendum)
History     CSN: [id number]  Arrival date & time 05/28/13  7829   First MD Initiated Contact with Patient 05/28/13 0840      Chief Complaint  Patient presents with  . Rectal Bleeding    (Consider location/radiation/quality/duration/timing/severity/associated sxs/prior treatment) Patient is a 27 y.o. female presenting with hematochezia. The history is provided by the patient.  Rectal Bleeding Quality:  Bright red Amount:  Scant Duration:  1 hour Timing:  Intermittent Progression:  Unchanged Chronicity:  New Context: defecation   Similar prior episodes: no   Relieved by:  Nothing Worsened by:  Nothing tried Ineffective treatments:  None tried Associated symptoms: no abdominal pain   pt with constipation, denies weakness, abd pain or vomiting  Past Medical History  Diagnosis Date  . Bipolar disorder   . Attention deficit hyperactivity disorder   . Depression   . Anxiety   . Asthma   . UTI (urinary tract infection)     Completed Cipro 08/06/12  . Gastritis     Past Surgical History  Procedure Laterality Date  . Mouth surgery    . Eye muscle surgery Bilateral     07/29/12    Family History  Problem Relation Age of Onset  . Depression Mother   . Depression Father   . Depression Brother   . Colon cancer Mother   . Colon polyps Mother   . Liver disease    . Kidney disease      History  Substance Use Topics  . Smoking status: Never Smoker   . Smokeless tobacco: Never Used  . Alcohol Use: No    OB History   Grav Para Term Preterm Abortions TAB SAB Ect Mult Living   0 0              Review of Systems  Gastrointestinal: Positive for hematochezia. Negative for abdominal pain.  All other systems reviewed and are negative.    Allergies  Depakote; Methylphenidate derivatives; and Ritalin  Home Medications   Current Outpatient Rx  Name  Route  Sig  Dispense  Refill  . ARIPiprazole (ABILIFY) 30 MG tablet   Oral   Take 30 mg by mouth daily.          . carbamazepine (TEGRETOL) 200 MG tablet   Oral   Take 200-400 mg by mouth 2 (two) times daily. Take 1 tablet (200mg ) in the morning Take 2 tablets (400mg ) at bedtime         . citalopram (CELEXA) 10 MG tablet   Oral   Take 10 mg by mouth daily. Take along with 20mg  tablet to make a total of 30mg .         . citalopram (CELEXA) 20 MG tablet   Oral   Take 20 mg by mouth daily. Take along with 10mg  to make a total of 30mg .         . fexofenadine-pseudoephedrine (ALLEGRA-D) 60-120 MG per tablet   Oral   Take 1 tablet by mouth every 12 (twelve) hours.   30 tablet   3   . fluconazole (DIFLUCAN) 150 MG tablet   Oral   Take 1 tablet (150 mg total) by mouth once.   1 tablet   0   . fluticasone (FLONASE) 50 MCG/ACT nasal spray               . loperamide (IMODIUM A-D) 2 MG tablet   Oral   Take 1 tablet (2 mg total) by mouth 4 (four)  times daily as needed for diarrhea or loose stools.   30 tablet   0   . LORazepam (ATIVAN) 0.5 MG tablet               . metroNIDAZOLE (FLAGYL) 500 MG tablet   Oral   Take 1 tablet (500 mg total) by mouth 2 (two) times daily.   14 tablet   0   . Pseudoeph-Chlorphen-Hydrocod 60-4-5 MG/5ML SOLN   Oral   Take 5 mLs by mouth 3 (three) times daily as needed.   1 Bottle   0   . trazodone (DESYREL) 300 MG tablet   Oral   Take 300 mg by mouth at bedtime. For insomnia.           BP 105/58  Pulse 66  Temp(Src) 98.6 F (37 C) (Oral)  Resp 18  Ht 5\' 6"  (1.676 m)  SpO2 96%  Physical Exam  Nursing note and vitals reviewed. Constitutional: She is oriented to person, place, and time. She appears well-developed and well-nourished.  Non-toxic appearance. No distress.  HENT:  Head: Normocephalic and atraumatic.  Eyes: Conjunctivae, EOM and lids are normal. Pupils are equal, round, and reactive to light.  Neck: Normal range of motion. Neck supple. No tracheal deviation present. No mass present.  Cardiovascular: Normal rate,  regular rhythm and normal heart sounds.  Exam reveals no gallop.   No murmur heard. Pulmonary/Chest: Effort normal and breath sounds normal. No stridor. No respiratory distress. She has no decreased breath sounds. She has no wheezes. She has no rhonchi. She has no rales.  Abdominal: Soft. Normal appearance and bowel sounds are normal. She exhibits no distension. There is no tenderness. There is no rebound and no CVA tenderness.  Musculoskeletal: Normal range of motion. She exhibits no edema and no tenderness.  Neurological: She is alert and oriented to person, place, and time. She has normal strength. No cranial nerve deficit or sensory deficit. GCS eye subscore is 4. GCS verbal subscore is 5. GCS motor subscore is 6.  Skin: Skin is warm and dry. No abrasion and no rash noted.  Psychiatric: She has a normal mood and affect. Her speech is normal and behavior is normal.    ED Course  Procedures (including critical care time)  Labs Reviewed - No data to display No results found.   1. Rectal bleeding       MDM  Pt refused rectal exam wants to f/u her pcp        Toy Baker, MD 05/28/13 1610  Toy Baker, MD 06/30/13 [phone]

## 2013-05-28 NOTE — ED Notes (Signed)
Per EMS pt comes from home where she c/o blood in her stool this am.  States is was bright red blood when she wiped.  Denied diarrhea, nausea but has been vomiting which is normal with her acid reflex.

## 2013-05-28 NOTE — ED Notes (Signed)
[id number]<JY> Expected date:<BR> Expected time:<BR> Means of arrival:<BR> Comments:<BR> ems- 27 yo Female blood in stool

## 2013-06-15 ENCOUNTER — Ambulatory Visit (INDEPENDENT_AMBULATORY_CARE_PROVIDER_SITE_OTHER): Payer: Medicare Other | Admitting: Internal Medicine

## 2013-06-15 ENCOUNTER — Encounter: Payer: Self-pay | Admitting: Internal Medicine

## 2013-06-15 VITALS — BP 109/71 | HR 87 | Temp 97.9°F | Ht 64.75 in | Wt 238.8 lb

## 2013-06-15 DIAGNOSIS — M25473 Effusion, unspecified ankle: Secondary | ICD-10-CM

## 2013-06-15 DIAGNOSIS — R1110 Vomiting, unspecified: Secondary | ICD-10-CM

## 2013-06-15 MED ORDER — ALBUTEROL SULFATE HFA 108 (90 BASE) MCG/ACT IN AERS: 2.0000 | INHALATION_SPRAY | Freq: Four times a day (QID) | RESPIRATORY_TRACT | Status: DC | PRN

## 2013-06-15 NOTE — Progress Notes (Signed)
Subjective:   Patient ID: [name] female   DOB: January 27, 1986 27 y.o.   MRN: [id number]  Chief complaints: ankle swelling HPI: [name] is a 27 y.o. woman with PMH of substance abuse ( Amphetamines), ADHD, anxiety, depression and Bipolar disorder who presents to the clinic for ankle swelling.   Patient reports occasional ankle swelling during her work. She works 2-3 times weekly and is on her feet constantly during her work time. She noticed ankle swelling at the end of day, which would resolve spontaneously next day. She feels well otherwise. She is here for evaluation.   With regards to her psychiatry disorders, she has been followed by her Psychiatrist. She was admitted to behavioral health center in June 2013. Her current psychiatric meds include Abilify, Ativan, Celexa, tegretol, and Trazodone.     Past Medical History  Diagnosis Date  . Bipolar disorder   . Attention deficit hyperactivity disorder   . Depression   . Anxiety   . Asthma   . UTI (urinary tract infection)     Completed Cipro 08/06/12  . Gastritis    Current Outpatient Prescriptions  Medication Sig Dispense Refill  . ARIPiprazole (ABILIFY) 30 MG tablet Take 30 mg by mouth daily.      . carbamazepine (TEGRETOL) 200 MG tablet Take 200-400 mg by mouth 2 (two) times daily. Take 1 tablet (200mg ) in the morning Take 2 tablets (400mg ) at bedtime      . citalopram (CELEXA) 20 MG tablet Take 20 mg by mouth daily. Take along with 10mg  to make a total of 30mg .      [name] LORazepam (ATIVAN) 0.5 MG tablet 0.5 mg every 8 (eight) hours as needed.       . trazodone (DESYREL) 300 MG tablet Take 300 mg by mouth at bedtime. For insomnia.      [name] albuterol (PROVENTIL HFA;VENTOLIN HFA) 108 (90 BASE) MCG/ACT inhaler Inhale 2 puffs into the lungs every 6 (six) hours as needed for wheezing.  1 Inhaler  11   No current facility-administered medications for this visit.   Family History  Problem Relation Age of Onset  .  Depression Mother   . Depression Father   . Depression Brother   . Colon cancer Mother   . Colon polyps Mother   . Liver disease    . Kidney disease     History   Social History  . Marital Status: Single    Spouse Name: N/A    Number of Children: 0  . Years of Education: N/A   Social History Main Topics  . Smoking status: Never Smoker   . Smokeless tobacco: Never Used  . Alcohol Use: No  . Drug Use: No  . Sexually Active: Yes    Birth Control/ Protection: Inserts   Other Topics Concern  . None   Social History Narrative  . None   Review of Systems: Review of Systems:  Constitutional:  Denies fever, chills, diaphoresis, appetite change and fatigue.   HEENT:  Denies congestion, sore throat, rhinorrhea, sneezing, mouth sores, trouble swallowing, neck pain   Respiratory:  Denies SOB, DOE, cough, and wheezing.   Cardiovascular:  Denies palpitations and leg swelling.   Gastrointestinal:  Denies nausea, vomiting, abdominal pain, diarrhea, constipation, blood in stool and abdominal distention.   Genitourinary:  Denies dysuria, urgency, frequency, denies hematuria, flank pain and difficulty urinating.   Musculoskeletal:  Denies myalgias, back pain, joint swelling, arthralgias and gait problem.   Skin:  Denies pallor,  rash and wound.   Neurological:  Denies dizziness, seizures, syncope, weakness, light-headedness, numbness and headaches.    .   Objective:  Physical Exam: Filed Vitals:   06/15/13 1344  BP: 109/71  Pulse: 87  Temp: 97.9 F (36.6 C)  TempSrc: Oral  Height: 5' 4.75" (1.645 m)  Weight: 238 lb 12.8 oz (108.319 kg)  SpO2: 98%   General: NAD Extremities: No cyanosis, clubbing, edema  She declines physical examination except above assessment.   Assessment & Plan:

## 2013-06-15 NOTE — Patient Instructions (Addendum)
1. Wear stocking during the work 2. Low salt diet 3. Follow up in 6 months.  Sodium-Controlled Diet Sodium is a mineral. It is found in many foods. Sodium may be found naturally or added during the making of a food. The most common form of sodium is salt, which is made up of sodium and chloride. Reducing your sodium intake involves changing your eating habits. The following guidelines will help you reduce the sodium in your diet:  Stop using the salt shaker.  Use salt sparingly in cooking and baking.  Substitute with sodium-free seasonings and spices.  Do not use a salt substitute (potassium chloride) without your caregiver's permission.  Include a variety of fresh, unprocessed foods in your diet.  Limit the use of processed and convenience foods that are high in sodium. USE THE FOLLOWING FOODS SPARINGLY: Breads/Starches  Commercial bread stuffing, commercial pancake or waffle mixes, coating mixes. Waffles. Croutons. Prepared (boxed or frozen) potato, rice, or noodle mixes that contain salt or sodium. Salted [place] fries or hash browns. Salted popcorn, breads, crackers, chips, or snack foods. Vegetables  Vegetables canned with salt or prepared in cream, butter, or cheese sauces. Sauerkraut. Tomato or vegetable juices canned with salt.  Fresh vegetables are allowed if rinsed thoroughly. Fruit  Fruit is okay to eat. Meat and Meat Substitutes  Salted or smoked meats, such as bacon or Canadian bacon, chipped or corned beef, hot dogs, salt pork, luncheon meats, pastrami, ham, or sausage. Canned or smoked fish, poultry, or meat. Processed cheese or cheese spreads, blue or Roquefort cheese. Battered or frozen fish products. Prepared spaghetti sauce. Baked beans. Reuben sandwiches. Salted nuts. Caviar. Milk  Limit buttermilk to 1 cup per week. Soups and Combination Foods  Bouillon cubes, canned or dried soups, broth, consomm. Convenience (frozen or packaged) dinners with more than 600  mg sodium. Pot pies, pizza, Asian food, fast food cheeseburgers, and specialty sandwiches. Desserts and Sweets  Regular (salted) desserts, pie, commercial fruit snack pies, commercial snack cakes, canned puddings.  Eat desserts and sweets in moderation. Fats and Oils  Gravy mixes or canned gravy. No more than 1 to 2 tbs of salad dressing. Chip dips.  Eat fats and oils in moderation. Beverages  See those listed under the vegetables and milk groups. Condiments  Ketchup, mustard, meat sauces, salsa, regular (salted) and lite soy sauce or mustard. Dill pickles, olives, meat tenderizer. Prepared horseradish or pickle relish. Dutch-processed cocoa. Baking powder or baking soda used medicinally. Worcestershire sauce. "Light" salt. Salt substitute, unless approved by your caregiver. Document Released: 05/23/2002 Document Revised: 02/23/2012 Document Reviewed: 12/24/2009 [facility] Patient Information 2014 [place], [place].

## 2013-06-15 NOTE — Assessment & Plan Note (Signed)
Occasional ankle swelling when standing for long period of time. No varicose veins noted on exam. No edema noted.   - instruct low sodium diet - wear compression stocking on the working days. - elevated feet as much as possible.

## 2013-06-16 NOTE — Progress Notes (Signed)
Case discussed with Dr. Li soon after the resident saw the patient. We reviewed the resident's history and exam and pertinent patient test results. I agree with the assessment, diagnosis, and plan of care documented in the resident's note. 

## 2013-06-23 ENCOUNTER — Ambulatory Visit (HOSPITAL_COMMUNITY)
Admission: EM | Admit: 2013-06-23 | Discharge: 2013-06-23 | Disposition: A | Discharge status: Home / Self Care | Payer: Medicare Other | Attending: Psychiatry | Admitting: Psychiatry

## 2013-06-23 ENCOUNTER — Institutional Professional Consult (permissible substitution): Payer: Self-pay | Admitting: Pulmonary Disease

## 2013-06-23 DIAGNOSIS — F319 Bipolar disorder, unspecified: Secondary | ICD-10-CM | POA: Insufficient documentation

## 2013-06-23 DIAGNOSIS — J45909 Unspecified asthma, uncomplicated: Secondary | ICD-10-CM | POA: Insufficient documentation

## 2013-06-23 DIAGNOSIS — F909 Attention-deficit hyperactivity disorder, unspecified type: Secondary | ICD-10-CM | POA: Insufficient documentation
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
## 2013-06-28 ENCOUNTER — Encounter: Payer: Self-pay | Admitting: Pulmonary Disease

## 2013-06-29 ENCOUNTER — Emergency Department (HOSPITAL_COMMUNITY): Payer: Medicare Other

## 2013-06-29 ENCOUNTER — Emergency Department (HOSPITAL_COMMUNITY)
Admission: EM | Admit: 2013-06-29 | Discharge: 2013-06-29 | Discharge status: Against Medical Advice | Payer: Medicare Other | Attending: Emergency Medicine | Admitting: Emergency Medicine

## 2013-06-29 ENCOUNTER — Encounter (HOSPITAL_COMMUNITY): Payer: Self-pay | Admitting: Emergency Medicine

## 2013-06-29 DIAGNOSIS — Z8719 Personal history of other diseases of the digestive system: Secondary | ICD-10-CM | POA: Insufficient documentation

## 2013-06-29 DIAGNOSIS — F329 Major depressive disorder, single episode, unspecified: Secondary | ICD-10-CM | POA: Insufficient documentation

## 2013-06-29 DIAGNOSIS — J45909 Unspecified asthma, uncomplicated: Secondary | ICD-10-CM | POA: Insufficient documentation

## 2013-06-29 DIAGNOSIS — Z8659 Personal history of other mental and behavioral disorders: Secondary | ICD-10-CM | POA: Insufficient documentation

## 2013-06-29 DIAGNOSIS — F411 Generalized anxiety disorder: Secondary | ICD-10-CM | POA: Insufficient documentation

## 2013-06-29 DIAGNOSIS — Z87440 Personal history of urinary (tract) infections: Secondary | ICD-10-CM | POA: Insufficient documentation

## 2013-06-29 DIAGNOSIS — F3289 Other specified depressive episodes: Secondary | ICD-10-CM | POA: Insufficient documentation

## 2013-06-29 DIAGNOSIS — Z79899 Other long term (current) drug therapy: Secondary | ICD-10-CM | POA: Insufficient documentation

## 2013-06-29 DIAGNOSIS — F319 Bipolar disorder, unspecified: Secondary | ICD-10-CM | POA: Insufficient documentation

## 2013-06-29 DIAGNOSIS — R079 Chest pain, unspecified: Secondary | ICD-10-CM

## 2013-06-29 NOTE — ED Notes (Signed)
Pt refusing medical treatment, states that she is ready to leave.

## 2013-06-29 NOTE — ED Provider Notes (Signed)
I discussed risk of death/disability of leaving against medical advice and the patient accepts these risks.  The patient is awake/alert able to make decisions, and not intoxicated Patient discharged against medical advice.    Pt wanted to leave immediately after arrival.  She refused all evaluation including EKG prehospital EKG reviewed - no acute ST changes noted Pt advised to return at any time for further evaluation  Joya Gaskins, MD 06/29/13 [phone]

## 2013-06-29 NOTE — ED Provider Notes (Signed)
History    CSN: [id number] Arrival date & time 06/29/13  4098  First MD Initiated Contact with Patient 06/29/13 [phone]     Chief Complaint  Patient presents with  . Chest Pain   (Consider location/radiation/quality/duration/timing/severity/associated sxs/prior Treatment) HPI Comments: Pt is a 27yo female who presents with central chest pain beginning at 3 AM. Pain is stabbing and left sided, now resolved. Pt cannot characterize it well in terms of severity or how long it lasted. Pt called EMS and now states she wants to leave. Denies diaphoresis, fever/chills, N/V, SOB or difficulty breathing, current chest pain, abd pain, LE swelling or pain. Hx of bipolar disorder, last evaluated 7/10 as an outpt.  Patient is a 27 y.o. female presenting with chest pain. The history is provided by the patient. No language interpreter was used.  Chest Pain  Past Medical History  Diagnosis Date  . Bipolar disorder   . Attention deficit hyperactivity disorder   . Depression   . Anxiety   . Asthma   . UTI (urinary tract infection)     Completed Cipro 08/06/12  . Gastritis    Past Surgical History  Procedure Laterality Date  . Mouth surgery    . Eye muscle surgery Bilateral     07/29/12   Family History  Problem Relation Age of Onset  . Depression Mother   . Depression Father   . Depression Brother   . Colon cancer Mother   . Colon polyps Mother   . Liver disease    . Kidney disease     History  Substance Use Topics  . Smoking status: Never Smoker   . Smokeless tobacco: Never Used  . Alcohol Use: No   OB History   Grav Para Term Preterm Abortions TAB SAB Ect Mult Living   0 0             Review of Systems  Cardiovascular: Positive for chest pain.  All other systems reviewed and are negative.  Otherwise per HPI  Allergies  Depakote; Methylphenidate derivatives; and Ritalin  Home Medications   Current Outpatient Rx  Name  Route  Sig  Dispense  Refill  . albuterol (PROVENTIL  HFA;VENTOLIN HFA) 108 (90 BASE) MCG/ACT inhaler   Inhalation   Inhale 2 puffs into the lungs every 6 (six) hours as needed for wheezing.   1 Inhaler   11   . ARIPiprazole (ABILIFY) 30 MG tablet   Oral   Take 30 mg by mouth daily.         . carbamazepine (TEGRETOL) 200 MG tablet   Oral   Take 200-400 mg by mouth 2 (two) times daily. Take 1 tablet (200mg ) in the morning Take 2 tablets (400mg ) at bedtime         . citalopram (CELEXA) 20 MG tablet   Oral   Take 20 mg by mouth daily. Take along with 10mg  to make a total of 30mg .         . gabapentin (NEURONTIN) 300 MG capsule   Oral   Take 300 mg by mouth 3 (three) times daily. Pt finding medication adversive, dizziness and grogginess so just taking at hs.         [name] LORazepam (ATIVAN) 0.5 MG tablet      0.5 mg every 8 (eight) hours as needed.          . trazodone (DESYREL) 300 MG tablet   Oral   Take 300 mg by mouth at bedtime. For  insomnia.          BP 128/75  Pulse 75  Temp(Src) 98.6 F (37 C) (Oral)  Resp 16  SpO2 98% Physical Exam  Nursing note reviewed. Constitutional: She is oriented to person, place, and time. She appears well-developed and well-nourished. No distress.  HENT:  Head: Normocephalic and atraumatic.  Cardiovascular: Normal rate, regular rhythm and normal heart sounds.   No murmur heard. Pulmonary/Chest: Effort normal and breath sounds normal. No respiratory distress.  Abdominal: Soft. Bowel sounds are normal. She exhibits no distension.  Musculoskeletal: Normal range of motion. She exhibits no edema and no tenderness.  Neurological: She is alert and oriented to person, place, and time. Coordination normal.  Skin: Skin is warm and dry. No rash noted. She is not diaphoretic.  Psychiatric: Her speech is normal. Her affect is blunt. She is not agitated, not aggressive and not combative. She expresses impulsivity. She expresses no homicidal and no suicidal ideation.  Not acutely psychotic,  delusional, or suicidal. Poor insight/judgment, but appears to have capacity.    ED Course  Procedures (including critical care time) Labs Reviewed - No data to display No results found. 1. Chest pain     MDM  27yo female with chest pain (now resolved), refuses EKG or other testing. Reviewed and reiterated risk of clinical decline, cardiac arrest, and subsequent disability or death. Pt chooses to leave AMA without work-up.  The above was discussed in its entirety with attending ED physician Dr. Bebe Shaggy.   Bobbye Morton, MD  PGY-2, [facility]  Bobbye Morton, MD 06/29/13 [phone]

## 2013-06-29 NOTE — ED Notes (Signed)
Pt refused EKG.

## 2013-06-29 NOTE — Discharge Instructions (Signed)

## 2013-06-29 NOTE — ED Notes (Signed)
[id number]<AV> Expected date:<BR> Expected time:<BR> Means of arrival:<BR> Comments:<BR> 27 y/o chest pain

## 2013-06-29 NOTE — ED Notes (Signed)
Per EMS-pt c/o of centralized chest pain that started 3am described as stabbing. Hx Bipolar, anxiety, depression. NAD at this time.

## 2013-06-29 NOTE — ED Provider Notes (Signed)
I have personally seen and examined the patient.  I have discussed the plan of care with the resident.  I have reviewed the documentation on PMH/FH/Soc. History.  I have reviewed the documentation of the resident and agree.   Joya Gaskins, MD 06/29/13 1254

## 2013-07-01 ENCOUNTER — Ambulatory Visit (HOSPITAL_COMMUNITY): Payer: Medicare Other

## 2013-07-01 ENCOUNTER — Ambulatory Visit (INDEPENDENT_AMBULATORY_CARE_PROVIDER_SITE_OTHER): Payer: Medicare Other | Admitting: Internal Medicine

## 2013-07-01 ENCOUNTER — Encounter: Payer: Self-pay | Admitting: Internal Medicine

## 2013-07-01 VITALS — BP 112/66 | HR 77 | Temp 98.0°F | Wt 242.7 lb

## 2013-07-01 DIAGNOSIS — R079 Chest pain, unspecified: Secondary | ICD-10-CM

## 2013-07-01 DIAGNOSIS — R0602 Shortness of breath: Secondary | ICD-10-CM

## 2013-07-01 DIAGNOSIS — R9431 Abnormal electrocardiogram [ECG] [EKG]: Secondary | ICD-10-CM

## 2013-07-01 DIAGNOSIS — K5900 Constipation, unspecified: Secondary | ICD-10-CM | POA: Insufficient documentation

## 2013-07-01 LAB — POCT URINE PREGNANCY: Preg Test, Ur: NEGATIVE

## 2013-07-01 MED ORDER — CITALOPRAM HYDROBROMIDE 20 MG PO TABS: 20.0000 mg | ORAL_TABLET | Freq: Every day | ORAL | Status: DC

## 2013-07-01 NOTE — Assessment & Plan Note (Signed)
Recent onset, substernal, non-radiating, tenderness to palpation of chest wall, improved with protonix.  Was in ED on 7/16 for pain then left AMA as she "did not like their attitude".  Claims today pain in 8/10 although does not appear to be in distress.  Reports nocturnal SOB and fall in bathtub backwards yesterday.  No major changes on EKG except for prolonged qtc 479 likely due to medications.  Noted to have incomplete RBBB and nonspecific t wave changes.  Currently no sob.  Likely MSK given tenderness and reproducibility vs. GERD as relieved with protonix.    -cxr (patitent says she may not get cxr done since her mom is waiting on her, i urged her to have it done today.  -continue protonix -discontinue trazodone (says doesn't help much), taper celexa to 20mg  qd, and recommended asap followup with psychiatrist for medication adjustment.  [facility] [place] but claim Corie Chiquito works in their other office that is closed today and to try back Monday.  Will have RN call on Monday [phone] and urged patient to call and make appointment as well.   -counseled extensive that if chest pain continues or doesn't improve or worsens or SOB, fever, chills, diaphoresis, radiation of pain down arms or back, to go to ED immediately.  She voices understanding.

## 2013-07-01 NOTE — Assessment & Plan Note (Signed)
Likely secondary to medications including celexa and trazodone.  -discontinued trazodone today as she says it is not helping and tapered celexa back down to 20mg  qd (she says celexa isn't helping much either) -urged patient to call psychiatrist office and schedule urgent appointment for further medication adjustment -tried calling Corie Chiquito, her provider at [place] [facility] but their office is closed on Friday -RN will call again on Monday to [phone] and/or send letter

## 2013-07-01 NOTE — Assessment & Plan Note (Signed)
Rectal pain with straining.  Recommended otc fiber and stool softener if needed

## 2013-07-01 NOTE — Patient Instructions (Addendum)
General Instructions:  Please use your inhaler as I showed you in clinic today  Please get your xray done  Try over the counter ibuprofen as needed for the chest wall pain 200-400mg  every 8 hours, maximum of 1.2grams per day do not take more  If your chest pain gets worse or does not improve and worsening shortness of breath, you need to go to the emergency room right away  We have dropped your celexa to 20mg  daily and stopped your trazodone.  Please follow up with your psychiatrist right away and make an appointment to be seen as soon as possible.   Treatment Goals:  Goals (1 Years of Data) as of 07/01/13   None      Progress Toward Treatment Goals:    Self Care Goals & Plans:  Self Care Goal 02/17/2013  Manage my medications take my medicines as prescribed  Eat healthy foods eat foods that are low in salt; eat baked foods instead of fried foods; eat smaller portions; eat more vegetables  Be physically active find an activity I enjoy    Care Management & Community Referrals:  Referral 02/03/2013  Referrals made for care management support none needed     Costochondritis Costochondritis is a condition in which the tissue (cartilage) that connects your ribs with your breastbone (sternum) becomes irritated and causes chest pain.  HOME CARE  Avoid activities that wear you out.  Do not strain your ribs. Avoid activities that use your:  Chest.  Belly.  Side muscles.  Put ice on the area.  Put ice in a plastic bag.  Place a towel betwen your skin and the bag.  Leave the ice on for 15-20 minutes, 3-4 times a day.  Only take medicine as told by your doctor. GET HELP RIGHT AWAY IF:   Your pain gets worse.  You are very uncomfortable.  You have a fever.  You have trouble breathing.  You cough up blood.  You start sweating or throwing up (vomiting).  You develop new, unexplained symptoms. MAKE SURE YOU:   Understand these instructions.  Will watch your  condition.  Will get help right away if you are not doing well or get worse. Document Released: 05/19/2008 Document Revised: 02/23/2012 Document Reviewed: 05/19/2008 [facility] Patient Information 2014 [place], [place].

## 2013-07-01 NOTE — Assessment & Plan Note (Signed)
Recent onset nocturnal sob per patient.  Wakes up in the middle of night due to sob, spontaneously resolves.  Thinks she needs o2 while sleeping.  Need to evaluate sleep pattern and nocturnal pulse ox  -sleep study -cxr (endorses recent fall)

## 2013-07-01 NOTE — Progress Notes (Signed)
Subjective:   Patient ID: [name] female   DOB: [birth year] 27 y.o.   MRN: [id number]  HPI: [name] is a 27 y.o. white female with PMH of ADHD, Bipolar disorder, depression, and multiple opc visits and frequent ED visits presenting to clinic today telling the front desk she has rectal pain but then informing me in the room she wishes to get admitted due to nocturnal SOB.  She asks me repeatedly to admit her and when I question her she says she gets short of breath at night in the middle of the night and eventually falls back asleep.  She says she feels like she needs oxygen to sleep at night and that her symptoms do spontaneously resolve.  She currently has no SOB.  She says this has been going on for a couple of weeks and also endorses substernal chest pain at times with tenderness to palpation of chest.  She also has occasional dizziness she claims but denies headaches, nausea, vomiting, abdominal pain, fever, chills, diarrhea.  She has constipation and i recommended fiber supplements.  She says she denies seeing blood in her stool but that it hurts when she has to strain for her BMs.    EKG in opc today showed prolonged qtc of 479 likely secondary to her medications and incomplete RBBB with nonspecific t wave changes notable for inversions in v1 and v2 not much different from prior ekg (which showed qtc 438 and t wave inversions in v1-v6).  Reviewed EKG with Dr. Criselda Peaches.   In regards to her chest pain: she went to the ED yesterday but left AMA because "she didn't like their attitude".  She says the chest pain improves with protonix and that she is having 8/10 chest pain at this time.  She has diffuse chest wall tenderness and currently denies any shortness of breath.  She says it feels like a sharp stabbing feeling and denies any radiation of the pain.  She last took protonix yesterday.    She denies smoking cigarettes but her mother who she lives with smokes often.  She claims  her depression is stable but that her trazodone and maybe even celexa do not work very well.  She follows in [place] with [facility].    When asked again if she will be admitted, I explained that likely she does not meet criteria for admission and appears to have chest wall pain and that we can consider sleep study that may explain her nocturnal symptoms and she was pleased with that management.  She also reports falling in her bathtub yesterday hitting her back and is sore due to the fall. She does report hitting the back of her head but denies any headache, nausea, or vomiting, changes in vision, but does occasionally have dizziness that was present prior to fall.  She is advised that if any of these symptoms occur, to go directly to ED. As such we will get CXR in case possible fracture that may explain chest wall pain and sob.   Past Medical History  Diagnosis Date  . Bipolar disorder   . Attention deficit hyperactivity disorder   . Depression   . Anxiety   . Asthma   . UTI (urinary tract infection)     Completed Cipro 08/06/12  . Gastritis    Current Outpatient Prescriptions  Medication Sig Dispense Refill  . ARIPiprazole (ABILIFY) 30 MG tablet Take 30 mg by mouth daily.      [name]  carbamazepine (TEGRETOL) 200 MG tablet Take 200-400 mg by mouth 2 (two) times daily. Take 1 tablet (200mg ) in the morning Take 2 tablets (400mg ) at bedtime      . citalopram (CELEXA) 20 MG tablet Take 1 tablet (20 mg total) by mouth daily.  30 tablet  0  . LORazepam (ATIVAN) 0.5 MG tablet 0.5 mg every 8 (eight) hours as needed.       [name] albuterol (PROVENTIL HFA;VENTOLIN HFA) 108 (90 BASE) MCG/ACT inhaler Inhale 2 puffs into the lungs every 6 (six) hours as needed for wheezing.  1 Inhaler  11   No current facility-administered medications for this visit.   Family History  Problem Relation Age of Onset  . Depression Mother   . Depression Father   . Depression Brother   .  Colon cancer Mother   . Colon polyps Mother   . Liver disease    . Kidney disease     History   Social History  . Marital Status: Single    Spouse Name: N/A    Number of Children: 0  . Years of Education: N/A   Social History Main Topics  . Smoking status: Never Smoker   . Smokeless tobacco: Never Used  . Alcohol Use: No  . Drug Use: No  . Sexually Active: Yes    Birth Control/ Protection: Inserts   Other Topics Concern  . Not on file   Social History Narrative  . No narrative on file   Review of Systems:  Constitutional:  Denies fever, chills, diaphoresis, appetite change and fatigue.   HEENT:  Denies congestion, sore throat, rhinorrhea, sneezing, mouth sores, trouble swallowing, neck pain   Respiratory:  Nocturnal SOB and occasional wheezing.  Denies DOE, cough, and wheezing.   Cardiovascular:  Chest pain.  Denies palpitations, and leg swelling.   Gastrointestinal:  Constipation.  Denies nausea, vomiting, abdominal pain, diarrhea, blood in stool and abdominal distention.   Genitourinary:  Denies dysuria, urgency, frequency, hematuria, flank pain and difficulty urinating.   Musculoskeletal:  Back pain.  Denies myalgias, joint swelling, arthralgias and gait problem.   Skin:  Denies pallor, rash and wound.   Neurological:  Dizziness.  Denies seizures, syncope, weakness, light-headedness, numbness and headaches.    Objective:  Physical Exam: Filed Vitals:   07/01/13 1536  BP: 112/66  Pulse: 77  Temp: 98 F (36.7 C)  TempSrc: Oral  Weight: 242 lb 11.2 oz (110.088 kg)  SpO2: 100%   Vitals reviewed. General: sitting in chair, does not appear to be in distress, flat affect minimal blinking and pouting HEENT: PERRL, EOMI, no scleral icterus Cardiac: RRR, no rubs, murmurs or gallops Chest: diffuse tenderness to palpation across chest and back Pulm: clear to auscultation bilaterally, no wheezes, rales, or rhonchi Abd: soft, nontender, nondistended, BS present Ext: warm  and well perfused, no pedal edema, +2DP B/L Neuro: alert and oriented X3, cranial nerves II-XII grossly intact, strength and sensation to light touch equal in bilateral upper and lower extremities  Assessment & Plan:  Discussed with Dr. Criselda Peaches SOB: sleep study, CXR Chest pain: likely MSK, EKG reviewed. Prolonged qtc, d/c trazodone and taper celexa, needs to follow up with psychiatrist asap

## 2013-07-03 ENCOUNTER — Encounter (HOSPITAL_COMMUNITY): Payer: Self-pay | Admitting: *Deleted

## 2013-07-03 ENCOUNTER — Emergency Department (HOSPITAL_COMMUNITY)
Admission: EM | Admit: 2013-07-03 | Discharge: 2013-07-03 | Discharge status: Against Medical Advice | Payer: Medicare Other | Attending: Emergency Medicine | Admitting: Emergency Medicine

## 2013-07-03 ENCOUNTER — Emergency Department (HOSPITAL_COMMUNITY): Payer: Medicare Other

## 2013-07-03 ENCOUNTER — Other Ambulatory Visit: Payer: Self-pay

## 2013-07-03 DIAGNOSIS — F909 Attention-deficit hyperactivity disorder, unspecified type: Secondary | ICD-10-CM | POA: Insufficient documentation

## 2013-07-03 DIAGNOSIS — F411 Generalized anxiety disorder: Secondary | ICD-10-CM | POA: Insufficient documentation

## 2013-07-03 DIAGNOSIS — Z79899 Other long term (current) drug therapy: Secondary | ICD-10-CM | POA: Insufficient documentation

## 2013-07-03 DIAGNOSIS — Z3202 Encounter for pregnancy test, result negative: Secondary | ICD-10-CM | POA: Insufficient documentation

## 2013-07-03 DIAGNOSIS — Z9049 Acquired absence of other specified parts of digestive tract: Secondary | ICD-10-CM | POA: Insufficient documentation

## 2013-07-03 DIAGNOSIS — R0600 Dyspnea, unspecified: Secondary | ICD-10-CM

## 2013-07-03 DIAGNOSIS — Z8719 Personal history of other diseases of the digestive system: Secondary | ICD-10-CM | POA: Insufficient documentation

## 2013-07-03 DIAGNOSIS — J45901 Unspecified asthma with (acute) exacerbation: Secondary | ICD-10-CM | POA: Insufficient documentation

## 2013-07-03 DIAGNOSIS — Z87440 Personal history of urinary (tract) infections: Secondary | ICD-10-CM | POA: Insufficient documentation

## 2013-07-03 DIAGNOSIS — R079 Chest pain, unspecified: Secondary | ICD-10-CM | POA: Insufficient documentation

## 2013-07-03 DIAGNOSIS — F319 Bipolar disorder, unspecified: Secondary | ICD-10-CM | POA: Insufficient documentation

## 2013-07-03 LAB — PREGNANCY, URINE: Preg Test, Ur: NEGATIVE

## 2013-07-03 NOTE — ED Notes (Signed)
Pt's mother reports pt seeing her PMD Friday for cp-was dx with costochondritis.  Mom reports that her doctor stopped her trazodone which she has been on for a long time.  Pt states "my body just don't feel right."  Pt reports feeling out of breath, numb and dizzy.  Pt's mother reports "she's not acting like herself at all."

## 2013-07-03 NOTE — ED Notes (Signed)
Pt states "I am ready to leave"; RN advised that the MD is not completed with pt and that she is not ready for discharge; pt states that she does not want to wait and requests to leave now; RN advised pt of potential risks and that pt accepts responsibility for leaving.

## 2013-07-03 NOTE — ED Provider Notes (Signed)
History     27yF with multiple complaints. Doesn't feel right. Attributes to trazadone just being stopped by PCP. Pt not sure why but appears to be because of QT prolongation noted on EKG. SOB at night. Feels ok during day. No fever or chills. No cough. No unusual leg pain or swelling. Intermittent sharp CP starting several days ago. No appreciable exacerbating or relieving factors aside from that she has noticed that the pain is worse when she touches the area. No rash.    CSN: [id number] Arrival date & time 07/03/13  1608  First MD Initiated Contact with Patient 07/03/13 1646     Chief Complaint  Patient presents with  . Shortness of Breath   (Consider location/radiation/quality/duration/timing/severity/associated sxs/prior Treatment) HPI Past Medical History  Diagnosis Date  . Bipolar disorder   . Attention deficit hyperactivity disorder   . Depression   . Anxiety   . Asthma   . UTI (urinary tract infection)     Completed Cipro 08/06/12  . Gastritis    Past Surgical History  Procedure Laterality Date  . Mouth surgery    . Eye muscle surgery Bilateral     07/29/12   Family History  Problem Relation Age of Onset  . Depression Mother   . Depression Father   . Depression Brother   . Colon cancer Mother   . Colon polyps Mother   . Liver disease    . Kidney disease     History  Substance Use Topics  . Smoking status: Never Smoker   . Smokeless tobacco: Never Used  . Alcohol Use: No   OB History   Grav Para Term Preterm Abortions TAB SAB Ect Mult Living   0 0             Review of Systems  All systems reviewed and negative, other than as noted in HPI.   Allergies  Depakote; Methylphenidate derivatives; and Ritalin  Home Medications   Current Outpatient Rx  Name  Route  Sig  Dispense  Refill  . albuterol (PROVENTIL HFA;VENTOLIN HFA) 108 (90 BASE) MCG/ACT inhaler   Inhalation   Inhale 2 puffs into the lungs every 6 (six) hours as needed for wheezing.  1 Inhaler   11   . ARIPiprazole (ABILIFY) 30 MG tablet   Oral   Take 30 mg by mouth daily.         . carbamazepine (TEGRETOL) 200 MG tablet   Oral   Take 200-400 mg by mouth 2 (two) times daily. Take 1 tablet (200mg ) in the morning Take 2 tablets (400mg ) at bedtime         . citalopram (CELEXA) 20 MG tablet   Oral   Take 1 tablet (20 mg total) by mouth daily.   30 tablet   0   . LORazepam (ATIVAN) 0.5 MG tablet   Oral   Take 0.5 mg by mouth every 8 (eight) hours as needed for anxiety.          . trazodone (DESYREL) 300 MG tablet   Oral   Take 300 mg by mouth at bedtime.          BP 120/94  Pulse 86  Temp(Src) 98.8 F (37.1 C) (Oral)  Resp 18  Ht 5\' 10"  (1.778 m)  Wt 190 lb (86.183 kg)  BMI 27.26 kg/m2  SpO2 100%  LMP 06/20/2013 Physical Exam  Nursing note and vitals reviewed. Constitutional: She appears well-developed and well-nourished. No distress.  Laying in bed.  NAD.   HENT:  Head: Normocephalic and atraumatic.  Eyes: Conjunctivae are normal. Right eye exhibits no discharge. Left eye exhibits no discharge.  Neck: Neck supple.  Cardiovascular: Normal rate, regular rhythm and normal heart sounds.  Exam reveals no gallop and no friction rub.   No murmur heard. Pulmonary/Chest: Effort normal and breath sounds normal. No respiratory distress. She exhibits tenderness.  Abdominal: Soft. She exhibits no distension. There is no tenderness.  Musculoskeletal: She exhibits no edema and no tenderness.  Lower extremities symmetric as compared to each other. No calf tenderness. Negative Homan's. No palpable cords.   Neurological: She is alert.  Skin: Skin is warm and dry. She is not diaphoretic.  Psychiatric: She has a normal mood and affect. Her behavior is normal. Thought content normal.    ED Course  Procedures (including critical care time) Labs Reviewed  PREGNANCY, URINE   Dg Chest 2 View  07/03/2013   *RADIOLOGY REPORT*  Clinical Data: Shortness of  breath  CHEST - 2 VIEW  Comparison: 11/29/2012  Findings: The heart, mediastinum and hila are within normal limits. The lungs are clear.  There are no pleural effusions or pneumothoraces.  The bony thorax and surrounding soft tissues are unremarkable.  IMPRESSION: Normal chest radiographs.   Original Report Authenticated By: Amie Portland, M.D.   EKG:  Rhythm: normal sinus Vent. rate 65 BPM PR interval 140 ms QRS duration 88 ms QT/QTc 392/408 ms: ST segments: NS ST changes Comparison: QT interval decreased from previous   1. Chest pain   2. Dyspnea     MDM  27yF with CP and vague sensation of not feeling write. Pt left AMA. I was not aware until she had already left.   Raeford Razor, MD 07/05/13 [phone]

## 2013-07-03 NOTE — ED Notes (Signed)
Patient reports that she was seen 2 weeks ago and was diagnosed with Trichomonas. Patient states she is now having abdominal pain, chills, and SOB. Patient states she has taken all of the pain meds that were prescribed and is still having abdominal pain. Patient states she is still having yellow vaginal discharge and having diarrhea.

## 2013-07-03 NOTE — ED Notes (Signed)
Pt refused lab work and wants to leave AMA.  RN aware.

## 2013-07-05 ENCOUNTER — Encounter (HOSPITAL_COMMUNITY): Payer: Self-pay | Admitting: Emergency Medicine

## 2013-07-05 ENCOUNTER — Encounter (HOSPITAL_COMMUNITY): Payer: Self-pay

## 2013-07-05 ENCOUNTER — Emergency Department (HOSPITAL_COMMUNITY)
Admission: EM | Admit: 2013-07-05 | Discharge: 2013-07-05 | Disposition: A | Discharge status: Other Acute Inpatient Hospital | Payer: Medicare Other | Source: Home / Self Care | Attending: Emergency Medicine | Admitting: Emergency Medicine

## 2013-07-05 ENCOUNTER — Emergency Department (HOSPITAL_COMMUNITY)
Admission: EM | Admit: 2013-07-05 | Discharge: 2013-07-05 | Disposition: A | Discharge status: Home / Self Care | Payer: Medicare Other | Source: Home / Self Care

## 2013-07-05 ENCOUNTER — Inpatient Hospital Stay (HOSPITAL_COMMUNITY)
Admission: AD | Admit: 2013-07-05 | Discharge: 2013-07-08 | DRG: 885 | Disposition: A | Discharge status: Home / Self Care | Payer: Medicare Other | Source: Intra-hospital | Attending: Psychiatry | Admitting: Psychiatry

## 2013-07-05 ENCOUNTER — Encounter (HOSPITAL_COMMUNITY): Payer: Self-pay | Admitting: *Deleted

## 2013-07-05 DIAGNOSIS — R0602 Shortness of breath: Secondary | ICD-10-CM

## 2013-07-05 DIAGNOSIS — R109 Unspecified abdominal pain: Secondary | ICD-10-CM

## 2013-07-05 DIAGNOSIS — Z79899 Other long term (current) drug therapy: Secondary | ICD-10-CM

## 2013-07-05 DIAGNOSIS — R45851 Suicidal ideations: Secondary | ICD-10-CM

## 2013-07-05 DIAGNOSIS — K5900 Constipation, unspecified: Secondary | ICD-10-CM

## 2013-07-05 DIAGNOSIS — F329 Major depressive disorder, single episode, unspecified: Secondary | ICD-10-CM

## 2013-07-05 DIAGNOSIS — J45909 Unspecified asthma, uncomplicated: Secondary | ICD-10-CM | POA: Diagnosis present

## 2013-07-05 DIAGNOSIS — N898 Other specified noninflammatory disorders of vagina: Secondary | ICD-10-CM

## 2013-07-05 DIAGNOSIS — E669 Obesity, unspecified: Secondary | ICD-10-CM

## 2013-07-05 DIAGNOSIS — J309 Allergic rhinitis, unspecified: Secondary | ICD-10-CM

## 2013-07-05 DIAGNOSIS — F1910 Other psychoactive substance abuse, uncomplicated: Secondary | ICD-10-CM

## 2013-07-05 DIAGNOSIS — N926 Irregular menstruation, unspecified: Secondary | ICD-10-CM

## 2013-07-05 DIAGNOSIS — R079 Chest pain, unspecified: Secondary | ICD-10-CM

## 2013-07-05 DIAGNOSIS — F3132 Bipolar disorder, current episode depressed, moderate: Principal | ICD-10-CM | POA: Diagnosis present

## 2013-07-05 DIAGNOSIS — M25471 Effusion, right ankle: Secondary | ICD-10-CM

## 2013-07-05 DIAGNOSIS — K219 Gastro-esophageal reflux disease without esophagitis: Secondary | ICD-10-CM

## 2013-07-05 DIAGNOSIS — R9431 Abnormal electrocardiogram [ECG] [EKG]: Secondary | ICD-10-CM

## 2013-07-05 DIAGNOSIS — F32A Depression, unspecified: Secondary | ICD-10-CM

## 2013-07-05 DIAGNOSIS — R1110 Vomiting, unspecified: Secondary | ICD-10-CM

## 2013-07-05 LAB — COMPREHENSIVE METABOLIC PANEL
ALT: 17 U/L (ref 0–35)
AST: 16 U/L (ref 0–37)
Albumin: 3.7 g/dL (ref 3.5–5.2)
Alkaline Phosphatase: 93 U/L (ref 39–117)
BUN: 12 mg/dL (ref 6–23)
CO2: 26 mEq/L (ref 19–32)
Calcium: 9.2 mg/dL (ref 8.4–10.5)
Chloride: 103 mEq/L (ref 96–112)
Creatinine, Ser: 0.81 mg/dL (ref 0.50–1.10)
GFR calc Af Amer: >90 mL/min (ref >90)
GFR calc non Af Amer: >90 mL/min (ref >90)
Glucose, Bld: 92 mg/dL (ref 70–99)
Potassium: 4 mEq/L (ref 3.5–5.1)
Sodium: 138 mEq/L (ref 135–145)
Total Bilirubin: 0.1 mg/dL — ABNORMAL LOW (ref 0.3–1.2)
Total Protein: 7.2 g/dL (ref 6.0–8.3)

## 2013-07-05 LAB — RAPID URINE DRUG SCREEN, HOSP PERFORMED
Amphetamines: NOT DETECTED
Barbiturates: NOT DETECTED
Benzodiazepines: NOT DETECTED
Cocaine: NOT DETECTED
Opiates: NOT DETECTED
Tetrahydrocannabinol: NOT DETECTED

## 2013-07-05 LAB — CBC WITH DIFFERENTIAL/PLATELET
Basophils Absolute: 0 10*3/uL (ref 0.0–0.1)
Basophils Relative: 0 % (ref 0–1)
Eosinophils Absolute: 0 10*3/uL (ref 0.0–0.7)
Eosinophils Relative: 0 % (ref 0–5)
HCT: 41.4 % (ref 36.0–46.0)
Hemoglobin: 13.8 g/dL (ref 12.0–15.0)
Lymphocytes Relative: 31 % (ref 12–46)
Lymphs Abs: 1.8 10*3/uL (ref 0.7–4.0)
MCH: 31.2 pg (ref 26.0–34.0)
MCHC: 33.3 g/dL (ref 30.0–36.0)
MCV: 93.7 fL (ref 78.0–100.0)
Monocytes Absolute: 0.2 10*3/uL (ref 0.1–1.0)
Monocytes Relative: 4 % (ref 3–12)
Neutro Abs: 3.8 10*3/uL (ref 1.7–7.7)
Neutrophils Relative %: 65 % (ref 43–77)
Platelets: 178 10*3/uL (ref 150–400)
RBC: 4.42 MIL/uL (ref 3.87–5.11)
RDW: 12.1 % (ref 11.5–15.5)
WBC: 6 10*3/uL (ref 4.0–10.5)

## 2013-07-05 LAB — ETHANOL: Alcohol, Ethyl (B): <11 mg/dL (ref 0–11)

## 2013-07-05 LAB — URINALYSIS, ROUTINE W REFLEX MICROSCOPIC
Bilirubin Urine: NEGATIVE
Glucose, UA: NEGATIVE mg/dL
Hgb urine dipstick: NEGATIVE
Ketones, ur: NEGATIVE mg/dL
Nitrite: NEGATIVE
Protein, ur: NEGATIVE mg/dL
Specific Gravity, Urine: 1.029 (ref 1.005–1.030)
Urobilinogen, UA: 0.2 mg/dL (ref 0.0–1.0)
pH: 6.5 (ref 5.0–8.0)

## 2013-07-05 LAB — ACETAMINOPHEN LEVEL: Acetaminophen (Tylenol), Serum: <15 ug/mL (ref 10–30)

## 2013-07-05 LAB — SALICYLATE LEVEL: Salicylate Lvl: <2 mg/dL — ABNORMAL LOW (ref 2.8–20.0)

## 2013-07-05 LAB — URINE MICROSCOPIC-ADD ON

## 2013-07-05 LAB — POCT PREGNANCY, URINE: Preg Test, Ur: NEGATIVE

## 2013-07-05 LAB — LIPASE, BLOOD: Lipase: 30 U/L (ref 11–59)

## 2013-07-05 MED ORDER — ACETAMINOPHEN 325 MG PO TABS: 650.0000 mg | ORAL_TABLET | Freq: Four times a day (QID) | ORAL | Status: DC | PRN

## 2013-07-05 MED ORDER — ONDANSETRON HCL 4 MG PO TABS: 4.0000 mg | ORAL_TABLET | Freq: Three times a day (TID) | ORAL | Status: DC | PRN

## 2013-07-05 MED ORDER — ARIPIPRAZOLE 15 MG PO TABS
30.0000 mg | ORAL_TABLET | Freq: Every day | ORAL | Status: DC
  Filled 2013-07-05 (×2): qty 2

## 2013-07-05 MED ORDER — ALBUTEROL SULFATE HFA 108 (90 BASE) MCG/ACT IN AERS: 2.0000 | INHALATION_SPRAY | Freq: Four times a day (QID) | RESPIRATORY_TRACT | Status: DC | PRN

## 2013-07-05 MED ORDER — MAGNESIUM HYDROXIDE 400 MG/5ML PO SUSP: 30.0000 mL | Freq: Every day | ORAL | Status: DC | PRN

## 2013-07-05 MED ORDER — LORAZEPAM 1 MG PO TABS: 1.0000 mg | ORAL_TABLET | Freq: Three times a day (TID) | ORAL | Status: DC | PRN

## 2013-07-05 MED ORDER — ALUM & MAG HYDROXIDE-SIMETH 200-200-20 MG/5ML PO SUSP: 30.0000 mL | ORAL | Status: DC | PRN

## 2013-07-05 MED ORDER — CARBAMAZEPINE 200 MG PO TABS
200.0000 mg | ORAL_TABLET | Freq: Every day | ORAL | Status: DC
  Filled 2013-07-05 (×2): qty 1

## 2013-07-05 MED ORDER — ACETAMINOPHEN 325 MG PO TABS: 650.0000 mg | ORAL_TABLET | ORAL | Status: DC | PRN

## 2013-07-05 MED ORDER — ZOLPIDEM TARTRATE 5 MG PO TABS: 5.0000 mg | ORAL_TABLET | Freq: Every evening | ORAL | Status: DC | PRN

## 2013-07-05 MED ORDER — NICOTINE 21 MG/24HR TD PT24: 21.0000 mg | MEDICATED_PATCH | Freq: Every day | TRANSDERMAL | Status: DC

## 2013-07-05 MED ORDER — LORAZEPAM 0.5 MG PO TABS: 0.5000 mg | ORAL_TABLET | Freq: Three times a day (TID) | ORAL | Status: DC | PRN

## 2013-07-05 MED ORDER — IBUPROFEN 200 MG PO TABS: 600.0000 mg | ORAL_TABLET | Freq: Three times a day (TID) | ORAL | Status: DC | PRN

## 2013-07-05 MED ORDER — CITALOPRAM HYDROBROMIDE 20 MG PO TABS
20.0000 mg | ORAL_TABLET | Freq: Every day | ORAL | Status: DC
  Filled 2013-07-05 (×2): qty 1

## 2013-07-05 MED ORDER — CARBAMAZEPINE 200 MG PO TABS
400.0000 mg | ORAL_TABLET | Freq: Every day | ORAL | Status: DC
  Administered 2013-07-05: 400 mg via ORAL
  Filled 2013-07-05 (×3): qty 2

## 2013-07-05 MED ORDER — HYDROXYZINE HCL 50 MG PO TABS
50.0000 mg | ORAL_TABLET | Freq: Every evening | ORAL | Status: DC | PRN
  Administered 2013-07-05 – 2013-07-07 (×3): 50 mg via ORAL
  Filled 2013-07-05 (×5): qty 1
  Filled 2013-07-05: qty 4
  Filled 2013-07-05 (×3): qty 1
  Filled 2013-07-05: qty 4

## 2013-07-05 NOTE — ED Notes (Signed)
Pt is awake and alert, denies HI,SI,AH or VH. Pt is pleasant and cooperative. Report given @2045  to RN Micheal.Pt is transported to [facility] by [facility].

## 2013-07-05 NOTE — ED Notes (Signed)
1 pt belonging bag at triage nurses station

## 2013-07-05 NOTE — Progress Notes (Signed)
I saw and evaluated the patient.  I personally confirmed the key portions of the history and exam documented by Dr. Qureshi and I reviewed pertinent patient test results.  The assessment, diagnosis, and plan were formulated together and I agree with the documentation in the resident's note. 

## 2013-07-05 NOTE — ED Notes (Signed)
Patient changed into blue scrubs. Security called to wand patient and belongings.

## 2013-07-05 NOTE — ED Notes (Signed)
Pt seen in ED 7/16 and 7/20 for similar complaints. Pt reports she "doesnt feel right and is out of breath at night". Pt denies pain.

## 2013-07-05 NOTE — ED Notes (Signed)
Patient wanded by security. 

## 2013-07-05 NOTE — BH Assessment (Signed)
Assessment Note   [name] is an 27 y.o. female presents voluntarily as a walk in to [facility] accompanied by her mother. Pt has just left the [facility] hour earlier with a complaint of SOB w/CP. Pt irritable, poor eye contact, reports that she is compliant with her medication. Pt reports that her doctor has d/c'd her Trazadone and the mother said "I don't feel comfortable with her around me". Pt denies knowledge of what is going on with her mental health. Pt said "Yeah, I'm having suicidal thoughts and I got a plan to hang my self". Pt then recanted her statement to hang herself.  Mother reports patient has been having irritability, impulsiveness, some suicidal ideation, and mother's main concern is preoccupation with social media websites, giving out home address to strangers who then come by and want to take her out in their cars, which mother won't allow. Pt has told her mother that she has some thoughts to cut herself. Per pt's mom she cannot trust her. Pt denies homicidal ideation or having any intent. Denies AH/VH or paranoia, yet she constantly looks to her left as if looking at something. Pt admits to being irritable, Pt was recently hospitalized in June 2014, at [facility] for one day for suicidal impulses. Pt has a long history of impulsive behavior and poor judgment. Patient does not have friends, most of her activities are on the Internet. Pt is sent to [facility] for medical clearance. Pt will be admitted to [facility] once cleared. Denice Bors, AADC 07/05/2013 6:49 PM    Axis I: Psychotic Disorder NOS Axis II: Deferred Axis III:  Past Medical History  Diagnosis Date  . Bipolar disorder   . Attention deficit hyperactivity disorder   . Depression   . Anxiety   . Asthma   . UTI (urinary tract infection)     Completed Cipro 08/06/12  . Gastritis    Axis IV: other psychosocial or environmental problems, problems related to legal system/crime, problems related to social  environment, problems with access to health care services and problems with primary support group Axis V: 31-40 impairment in reality testing  Past Medical History:  Past Medical History  Diagnosis Date  . Bipolar disorder   . Attention deficit hyperactivity disorder   . Depression   . Anxiety   . Asthma   . UTI (urinary tract infection)     Completed Cipro 08/06/12  . Gastritis     Past Surgical History  Procedure Laterality Date  . Mouth surgery    . Eye muscle surgery Bilateral     07/29/12    Family History:  Family History  Problem Relation Age of Onset  . Depression Mother   . Depression Father   . Depression Brother   . Colon cancer Mother   . Colon polyps Mother   . Liver disease    . Kidney disease      Social History:  reports that she has never smoked. She has never used smokeless tobacco. She reports that she does not drink alcohol or use illicit drugs.  Additional Social History:  Alcohol / Drug Use Pain Medications: denies abuse Prescriptions: denies abuse Over the Counter: denies abuse History of alcohol / drug use?: No history of alcohol / drug abuse  CIWA: CIWA-Ar BP: 100/65 mmHg Pulse Rate: 69 COWS:    Allergies:  Allergies  Allergen Reactions  . Depakote (Divalproex Sodium)     Pt had h/o intolerance from Depakote. Stated that  she never wants this med.  . Methylphenidate Derivatives     Gets depressed and lots of anger  . Ritalin (Methylphenidate Hcl)     SAME AS PREVIOUS LISTED METHYLPHENIDATE    Home Medications:  (Not in a hospital admission)  OB/GYN Status:  Patient's last menstrual period was 06/20/2013.  General Assessment Data Location of Assessment: [facility] Living Arrangements: Parent Can pt return to current living arrangement?: Yes Admission Status: Voluntary Is patient capable of signing voluntary admission?: Yes Transfer from: Home Referral Source: Self/Family/Friend  Education Status Is patient  currently in school?: No  Risk to self Suicidal Ideation: Yes-Currently Present Suicidal Intent: No Is patient at risk for suicide?: Yes Suicidal Plan?: Yes-Currently Present Specify Current Suicidal Plan:  (pt reported that she would hang her self) Access to Means: Yes Specify Access to Suicidal Means:  (pt reported that she would use a rope) What has been your use of drugs/alcohol within the last 12 months?:  (none) Previous Attempts/Gestures: Yes How many times?:  (3) Other Self Harm Risks:  (inviting strangers to home) Triggers for Past Attempts: Family contact Intentional Self Injurious Behavior: None Family Suicide History: Unknown Recent stressful life event(s): Conflict (Comment) Persecutory voices/beliefs?: No Depression: Yes Depression Symptoms: Tearfulness;Isolating;Fatigue;Guilt;Loss of interest in usual pleasures;Feeling worthless/self pity;Feeling angry/irritable Substance abuse history and/or treatment for substance abuse?: No (pt.'s UDS/ETOH both neg.) Suicide prevention information given to non-admitted patients: Not applicable  Risk to Others Homicidal Ideation: No Thoughts of Harm to Others: No Current Homicidal Intent: No Current Homicidal Plan: No Access to Homicidal Means: No History of harm to others?: No Assessment of Violence: None Noted Does patient have access to weapons?: No Criminal Charges Pending?: No Does patient have a court date: No  Psychosis Hallucinations: None noted (pt appeared distracted by internal stimuli) Delusions: None noted  Mental Status Report Appear/Hygiene: Poor hygiene Eye Contact: Fair Motor Activity: Freedom of movement Speech: Logical/coherent Level of Consciousness: Alert;Irritable Mood: Depressed Affect: Appropriate to circumstance Anxiety Level: Moderate Thought Processes: Coherent;Relevant Judgement: Impaired Orientation: Person;Place;Time;Situation Obsessive Compulsive Thoughts/Behaviors: None  Cognitive  Functioning Concentration: Decreased Memory: Recent Impaired;Remote Impaired IQ: Average Insight: Poor Impulse Control: Poor Appetite: Good Sleep: No Change Total Hours of Sleep:  (7) Vegetative Symptoms: Decreased grooming  ADLScreening [facility] Assessment Services) Patient's cognitive ability adequate to safely complete daily activities?: Yes Patient able to express need for assistance with ADLs?: Yes Independently performs ADLs?: Yes (appropriate for developmental age)  Abuse/Neglect [facility]) Physical Abuse: Denies Verbal Abuse: Denies Sexual Abuse: Denies  Prior Inpatient Therapy Prior Inpatient Therapy: Yes Prior Therapy Dates: multiple, last June 2014 Prior Therapy Facilty/Provider(s): [facility] Reason for Treatment: depression SI  Prior Outpatient Therapy Prior Outpatient Therapy: Yes Prior Therapy Dates: current Prior Therapy Facilty/Provider(s): [facility] Reason for Treatment: bipolar  ADL Screening (condition at time of admission) Patient's cognitive ability adequate to safely complete daily activities?: Yes Is the patient deaf or have difficulty hearing?: No Does the patient have difficulty seeing, even when wearing glasses/contacts?: No Does the patient have difficulty concentrating, remembering, or making decisions?: No Patient able to express need for assistance with ADLs?: Yes Does the patient have difficulty dressing or bathing?: No Independently performs ADLs?: Yes (appropriate for developmental age) Does the patient have difficulty walking or climbing stairs?: No Weakness of Legs: None Weakness of Arms/Hands: None  Home Assistive Devices/Equipment Home Assistive Devices/Equipment: Eyeglasses    Abuse/Neglect Assessment (Assessment to be complete while patient is alone) Physical Abuse: Denies  Verbal Abuse: Denies Sexual Abuse: Denies Exploitation of patient/patient's resources: Denies Self-Neglect: Denies Values / Beliefs Cultural  Requests During Hospitalization: None Spiritual Requests During Hospitalization: None   Advance Directives (For Healthcare) Advance Directive: Patient does not have advance directive Pre-existing out of facility DNR order (yellow form or pink MOST form): No Nutrition Screen- [facility] Adult/[facility] Patient's home diet: Regular  Additional Information 1:1 In Past 12 Months?: No CIRT Risk: No Elopement Risk: No Does patient have medical clearance?: No     Disposition:  Disposition Initial Assessment Completed for this Encounter: Yes Disposition of Patient: Inpatient treatment program Type of inpatient treatment program: Adult Other disposition(s): To current provider  On Site Evaluation by:   Reviewed with Physician:     Manual Meier 07/05/2013 6:43 PM

## 2013-07-05 NOTE — Tx Team (Signed)
Initial Interdisciplinary Treatment Plan  PATIENT STRENGTHS: (choose at least two) Communication skills Physical Health Supportive family/friends  PATIENT STRESSORS: Would not elaborate   PROBLEM LIST: Problem List/Patient Goals Date to be addressed Date deferred Reason deferred Estimated date of resolution  Depression 07/05/13     Suicide Risk 07/05/13                                                DISCHARGE CRITERIA:  Improved stabilization in mood, thinking, and/or behavior Motivation to continue treatment in a less acute level of care Need for constant or close observation no longer present Verbal commitment to aftercare and medication compliance  PRELIMINARY DISCHARGE PLAN: Outpatient therapy Return to previous living arrangement  PATIENT/FAMIILY INVOLVEMENT: This treatment plan has been presented to and reviewed with the patient, [name], and/or family member, .  The patient and family have been given the opportunity to ask questions and make suggestions.  [name] 07/05/2013, 11:17 PM

## 2013-07-05 NOTE — BHH Counselor (Signed)
Patient accepted to [facility] by Fransisca Kaufmann, NP. The atttending psychiatrist is Dr. Jannifer Franklin. The room assignment is 402-2. Nursing report # is [phone].

## 2013-07-05 NOTE — ED Notes (Signed)
Pt came up to nurse's station with bag of mcdonald's and states that she is going to leave.  States that if she leaves, she is not allowed to just come back to her room and that she will have to check back in.  Pt states that is fine.  Pt walked out the door.

## 2013-07-05 NOTE — ED Provider Notes (Signed)
Medical screening examination/treatment/procedure(s) were performed by non-physician practitioner and as supervising physician I was immediately available for consultation/collaboration.   Ahmira Boisselle, MD 07/05/13 2210 

## 2013-07-05 NOTE — Progress Notes (Signed)
Patient ID: [name], female   DOB: Jan 21, 1986, 27 y.o.   MRN: [id number]  Admission Note: Patient is a 27 year old female admitted to [facility] with SI and plans to hang herself. Pt recently at [facility] for SI. Pt tearful on admission stating "I just said it to get in here, I didn't mean it". Pt states that she was depressed for quite a while and wants all of her medications stopped and to "start over on them". Pt upset that she is on [address] hall stating "Why am I on this hall? I have to be moved this time!" Pt informed that is up to MD and to address it in the am on assessment. Pt with dull, flat affect endorsing depression. Pt oriented to unit; no s/s of distress noted.

## 2013-07-05 NOTE — ED Notes (Signed)
1 pt belonging bag in TCU locker #[id number]

## 2013-07-05 NOTE — ED Provider Notes (Signed)
History    This chart was scribed for non-physician practitioner Trixie Dredge, PA-C, working with Gerhard Munch, MD by Donne Anon, ED Scribe. This patient was seen in room WTR1/WLPT1 and the patient's care was started at 1527.  CSN: [id number] Arrival date & time 07/05/13  1509  First MD Initiated Contact with Patient 07/05/13 1527     No chief complaint on file.   The history is provided by the patient. No language interpreter was used.   HPI Comments: [name] is a 27 y.o. female who presents to the Emergency Department needing medical clearance for behavioral health for suicidal ideation. She states she went to [facility] and they advised her to come here. She states she has been depressed for a couple of months, but today it worsened and she came up with a plan to hang herself with a rope, already in her possession, once everyone went to sleep. She lives with her family. She reports feeling anxious. She previously overdosed on pills in a suicide attempt when she was 13. She denies visual and auditory hallucinations. Denies HI.   She was in the ED earlier today for CP but left AMA. She states she only had chest pain yesterday and has not had any today.  She states the pain felt like someone pounding on her chest, lasted 5 minutes and went away, only occurred when she was eating, improved when she was not eating. She denies fever, sore throat, cough, SOB, leg swelling, abdominal pain, nausea, vomiting, diarrhea, dysuria, urinary frequency, abnormal vaginal discharge, vaginal bleeding or any other pain. LMP July 7 was on time and normal.   Pt was in ED earlier today and left AMA.  Please see chart, EKG performed at that time.   No previous abdominal surgery.    Dr. Corie Chiquito is her PCP.    Past Medical History  Diagnosis Date  . Bipolar disorder   . Attention deficit hyperactivity disorder   . Depression   . Anxiety   . Asthma   . UTI (urinary tract infection)      Completed Cipro 08/06/12  . Gastritis    Past Surgical History  Procedure Laterality Date  . Mouth surgery    . Eye muscle surgery Bilateral     07/29/12   Family History  Problem Relation Age of Onset  . Depression Mother   . Depression Father   . Depression Brother   . Colon cancer Mother   . Colon polyps Mother   . Liver disease    . Kidney disease     History  Substance Use Topics  . Smoking status: Never Smoker   . Smokeless tobacco: Never Used  . Alcohol Use: No   OB History   Grav Para Term Preterm Abortions TAB SAB Ect Mult Living   0 0             Review of Systems  Constitutional: Negative for fever and chills.  HENT: Positive for congestion. Negative for sore throat.   Respiratory: Negative for cough and shortness of breath.   Cardiovascular: Negative for chest pain.  Gastrointestinal: Negative for nausea, vomiting, abdominal pain and diarrhea.  Genitourinary: Negative for dysuria, frequency, vaginal bleeding, vaginal discharge, vaginal pain and menstrual problem.  Neurological: Negative for headaches.  Psychiatric/Behavioral: Positive for suicidal ideas and dysphoric mood. Negative for hallucinations. The patient is nervous/anxious.   All other systems reviewed and are negative.    Allergies  Depakote; Methylphenidate derivatives; and  Ritalin  Home Medications   Current Outpatient Rx  Name  Route  Sig  Dispense  Refill  . albuterol (PROVENTIL HFA;VENTOLIN HFA) 108 (90 BASE) MCG/ACT inhaler   Inhalation   Inhale 2 puffs into the lungs every 6 (six) hours as needed for wheezing.   1 Inhaler   11   . ARIPiprazole (ABILIFY) 30 MG tablet   Oral   Take 30 mg by mouth daily.         . carbamazepine (TEGRETOL) 200 MG tablet   Oral   Take 200-400 mg by mouth 2 (two) times daily. Take 1 tablet (200mg ) in the morning Take 2 tablets (400mg ) at bedtime         . citalopram (CELEXA) 20 MG tablet   Oral   Take 1 tablet (20 mg total) by mouth  daily.   30 tablet   0   . LORazepam (ATIVAN) 0.5 MG tablet   Oral   Take 0.5 mg by mouth every 8 (eight) hours as needed for anxiety.          . trazodone (DESYREL) 300 MG tablet   Oral   Take 300 mg by mouth at bedtime.          BP 113/83  Pulse 93  Temp(Src) 99 F (37.2 C) (Oral)  Resp 16  SpO2 99%  LMP 06/20/2013  Physical Exam  Nursing note and vitals reviewed. Constitutional: She appears well-developed and well-nourished. No distress.  HENT:  Head: Normocephalic and atraumatic.  Neck: Normal range of motion. Neck supple.  Cardiovascular: Normal rate and regular rhythm.   Pulmonary/Chest: Effort normal and breath sounds normal. No stridor. No respiratory distress. She has no wheezes. She has no rales.  Abdominal: Soft. She exhibits no distension. There is tenderness in the right upper quadrant. There is no rebound and no guarding.  Musculoskeletal: She exhibits no edema.  Neurological: She is alert.  Skin: She is not diaphoretic.    ED Course  Procedures (including critical care time) DIAGNOSTIC STUDIES: Oxygen Saturation is 99% on RA, normal by my interpretation.    COORDINATION OF CARE: 3:30 PM Discussed treatment plan which includes consult with ACT team and labs with pt at bedside and pt agreed to plan.   Labs Reviewed  COMPREHENSIVE METABOLIC PANEL - Abnormal; Notable for the following:    Total Bilirubin 0.1 (*)    All other components within normal limits  SALICYLATE LEVEL - Abnormal; Notable for the following:    Salicylate Lvl <2.0 (*)    All other components within normal limits  URINALYSIS, ROUTINE W REFLEX MICROSCOPIC - Abnormal; Notable for the following:    APPearance CLOUDY (*)    Leukocytes, UA SMALL (*)    All other components within normal limits  URINE MICROSCOPIC-ADD ON - Abnormal; Notable for the following:    Squamous Epithelial / LPF MANY (*)    Bacteria, UA FEW (*)    All other components within normal limits  ACETAMINOPHEN  LEVEL  ETHANOL  URINE RAPID DRUG SCREEN (HOSP PERFORMED)  LIPASE, BLOOD  CBC WITH DIFFERENTIAL  POCT PREGNANCY, URINE   Dg Chest 2 View  07/03/2013   *RADIOLOGY REPORT*  Clinical Data: Shortness of breath  CHEST - 2 VIEW  Comparison: 11/29/2012  Findings: The heart, mediastinum and hila are within normal limits. The lungs are clear.  There are no pleural effusions or pneumothoraces.  The bony thorax and surrounding soft tissues are unremarkable.  IMPRESSION: Normal chest radiographs.  Original Report Authenticated By: Amie Portland, M.D.   1. Depression   2. Suicidal ideation     MDM  Pt with long standing depression now with SI and plan.  To be seen by ACT team and placed.   I personally performed the services described in this documentation, which was scribed in my presence. The recorded information has been reviewed and is accurate.    Trixie Dredge, PA-C 07/05/13 2048

## 2013-07-05 NOTE — ED Notes (Signed)
Pt presents with c/o SI. Pt went to [facility] and they told her that she needed to come here to get medically cleared. Pt says she had a plan to hang herself tonight after everyone went to bed. Pt is A & O x 4. Denies HI.

## 2013-07-06 DIAGNOSIS — F3130 Bipolar disorder, current episode depressed, mild or moderate severity, unspecified: Secondary | ICD-10-CM

## 2013-07-06 MED ORDER — FLUOXETINE HCL 20 MG PO CAPS
20.0000 mg | ORAL_CAPSULE | Freq: Every day | ORAL | Status: DC
Start: 2013-07-06 — End: 2013-07-08
  Administered 2013-07-06 – 2013-07-08 (×3): 20 mg via ORAL
  Filled 2013-07-06: qty 2
  Filled 2013-07-06 (×5): qty 1

## 2013-07-06 MED ORDER — LURASIDONE HCL 40 MG PO TABS
40.0000 mg | ORAL_TABLET | Freq: Every day | ORAL | Status: DC
  Administered 2013-07-06 – 2013-07-07 (×2): 40 mg via ORAL
  Filled 2013-07-06 (×3): qty 1
  Filled 2013-07-06: qty 2

## 2013-07-06 MED ORDER — OXCARBAZEPINE 150 MG PO TABS
150.0000 mg | ORAL_TABLET | Freq: Two times a day (BID) | ORAL | Status: DC
  Administered 2013-07-06 – 2013-07-08 (×5): 150 mg via ORAL
  Filled 2013-07-06 (×5): qty 1
  Filled 2013-07-06: qty 4
  Filled 2013-07-06: qty 1
  Filled 2013-07-06: qty 4
  Filled 2013-07-06 (×2): qty 1

## 2013-07-06 MED ORDER — TRAZODONE HCL 50 MG PO TABS
50.0000 mg | ORAL_TABLET | Freq: Every evening | ORAL | Status: DC | PRN
  Filled 2013-07-06: qty 2

## 2013-07-06 NOTE — Tx Team (Signed)
  Interdisciplinary Treatment Plan Update   Date Reviewed:  07/06/2013  Time Reviewed:  8:12 AM  Progress in Treatment:   Attending groups: Yes Participating in groups: Yes Taking medication as prescribed: Yes  Tolerating medication: Yes Family/Significant other contact made: No Patient understands diagnosis:No Limited insight  States she is ready to d/c today  Discussing patient identified problems/goals with staff: Yes  See initial plan Medical problems stabilized or resolved: Yes Denies suicidal/homicidal ideation: Yes  In tx team Patient has not harmed self or others: Yes  For review of initial/current patient goals, please see plan of care.  Estimated Length of Stay:  4-5 days  Reason for Continuation of Hospitalization: Depression Medication stabilization  New Problems/Goals identified:  N/A  Discharge Plan or Barriers:   return home, follow up outpt  Additional Comments:  [name] is an 26 y.o. female presents voluntarily as a walk in to [facility] accompanied by her mother. [facility] with a complaint of SOB w/CP. Pt irritable, poor eye contact, reports that she is compliant with her medication. Pt reports that her doctor has d/c'd her Trazadone and the mother said "I don't feel comfortable with her around me". Pt denies knowledge of what is going on with her mental health. Pt said "Yeah, I'm having suicidal thoughts and I got a plan to hang my self". Pt then recanted her statement to hang herself. Mother reports patient has been having irritability, impulsiveness, some suicidal ideation, and mother's main concern is preoccupation with social media websites, giving out home address to strangers who then come by and want to take her out in their cars, which mother won't allow   Attendees:  Signature: Thedore Mins, MD 07/06/2013 8:12 AM   Signature: Richelle Ito, LCSW 07/06/2013 8:12 AM  Signature: Fransisca Kaufmann, NP 07/06/2013 8:12 AM  Signature:  Joslyn Devon, RN 07/06/2013 8:12 AM  Signature: Liborio Nixon, RN 07/06/2013 8:12 AM  Signature:  07/06/2013 8:12 AM  Signature:   07/06/2013 8:12 AM  Signature:    Signature:    Signature:    Signature:    Signature:    Signature:      Scribe for Treatment Team:   Richelle Ito, LCSW  07/06/2013 8:12 AM

## 2013-07-06 NOTE — Progress Notes (Signed)
Patient in bed resting at the beginning of this shift. Her mood and affects appropriate. She denied SI/HI and denied hallucinations. Writer encouraged and supported patient. Q 15 minute check continues as ordered to maintain safety.

## 2013-07-06 NOTE — BHH Group Notes (Signed)
BHH Group Notes:  (Counselor/Nursing/MHT/Case Management/Adjunct)  07/06/2013 1:15PM  Type of Therapy:  Group Therapy  Participation Level:  Was invited but did not attend.  "My tummy hurts."  Participation Quality:  Appropriate  Affect:  Flat  Cognitive:  Oriented  Insight:  Improving  Engagement in Group:  Limited  Engagement in Therapy:  Limited  Modes of Intervention:  Discussion, Exploration and Socialization  Summary of Progress/Problems: The topic for group was balance in life.  Pt participated in the discussion about when their life was in balance and out of balance and how this feels.  Pt discussed ways to get back in balance and short term goals they can work on to get where they want to be.    [name] 07/06/2013 2:17 PM

## 2013-07-06 NOTE — Progress Notes (Signed)
Recreation Therapy Notes   Date: 07.23.2014 Time: 9:30am Location: 400 Hall Dayroom      Group Topic/Focus: Goal Setting  Participation Level: Minimal  Participation Quality: Appropriate  Affect: Flat  Cognitive: Oriented  Additional Comments: Activity: Goal Sheet ; Explanation: Patients were asked to identify two goals and the dates in which they would like to accomplish goals by.  Patient chose not to write goals down. Patient verbalized only goal of "going home today." LRT advised patient to discuss d/c with LCSW and physician. Patient nodded in agreement.   Marykay Lex Phenix Vandermeulen, LRT/CTRS  Jearl Klinefelter 07/06/2013 5:33 PM
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
## 2013-07-06 NOTE — Progress Notes (Signed)
Adult Psychoeducational Group Note  Date:  07/06/2013 Time:  11:00am Group Topic/Focus:  Personal Choices and Values:   The focus of this group is to help patients assess and explore the importance of values in their lives, how their values affect their decisions, how they express their values and what opposes their expression.  Participation Level:  Active  Participation Quality:  Appropriate  Affect:  Flat  Cognitive:  Appropriate  Insight: Appropriate  Engagement in Group:  Limited  Modes of Intervention:  Discussion and Education  Additional Comments:  Pt. Attended and participated in group with limited input. Pt stated when asked what her loan time gaol was she stated" Not to be admitted here anymore, stay on my medication and seek some therapy."  Shelly Bombard D 07/06/2013, 1:45 PM

## 2013-07-06 NOTE — [facility] Group Notes (Signed)
[name] LCSW Aftercare Discharge Planning Group Note   07/06/2013 8:12 AM  Participation Quality:  Did not attend    [place]

## 2013-07-06 NOTE — Progress Notes (Signed)
D: Patient denies SI/HI and auditory and visual hallucinations. Patient has a depressed mood and affect. The patient states that she is sad because she "hates being in the hospital." The patient is also upset at not being able to see family and friends whenever she wants to. The patient states that she just "wants to go home" to see her "boyfriend who lives in [place]." The patient reports sleeping fairly well and states that her appetite and energy level are normal. The patient is interacting within the milieu appropriately.  A: Patient given emotional support from RN. Patient encouraged to come to staff with concerns and/or questions. Patient's medication routine continued. Patient's orders and plan of care reviewed.  R: Patient remains cooperative. Will continue to monitor patient q15 minutes for safety.

## 2013-07-06 NOTE — [facility] Suicide Risk Assessment (Signed)
Suicide Risk Assessment  Admission Assessment     Nursing information obtained from:  Patient Demographic factors:  Caucasian;Unemployed Current Mental Status:  NA Loss Factors:  NA Historical Factors:  NA Risk Reduction Factors:  Sense of responsibility to family;Living with another person, especially a relative;Positive therapeutic relationship  CLINICAL FACTORS:   Bipolar Disorder:   Depressive phase Depression:   Aggression Anhedonia Hopelessness Impulsivity Insomnia Previous Psychiatric Diagnoses and Treatments  COGNITIVE FEATURES THAT CONTRIBUTE TO RISK:  Closed-mindedness Polarized thinking    SUICIDE RISK:   Minimal: No identifiable suicidal ideation.  Patients presenting with no risk factors but with morbid ruminations; may be classified as minimal risk based on the severity of the depressive symptoms  PLAN OF CARE:1. Admit for crisis management and stabilization. 2. Medication management to reduce current symptoms to base line and improve the     patient's overall level of functioning 3. Treat health problems as indicated. 4. Develop treatment plan to decrease risk of relapse upon discharge and the need for     readmission. 5. Psycho-social education regarding relapse prevention and self care. 6. Health care follow up as needed for medical problems. 7. Restart home medications where appropriate.   I certify that inpatient services furnished can reasonably be expected to improve the patient's condition.  Christina Waldrop,MD 07/06/2013, 10:59 AM

## 2013-07-07 DIAGNOSIS — F3132 Bipolar disorder, current episode depressed, moderate: Principal | ICD-10-CM

## 2013-07-07 NOTE — [facility] Suicide Risk Assessment (Signed)
[facility] INPATIENT:  Family/Significant Other Suicide Prevention Education  Suicide Prevention Education:  Education Completed; Pola Furno, mother, [phone] has been identified by the patient as the family member/significant other with whom the patient will be residing, and identified as the person(s) who will aid the patient in the event of a mental health crisis (suicidal ideations/suicide attempt).  With written consent from the patient, the family member/significant other has been provided the following suicide prevention education, prior to the and/or following the discharge of the patient.  The suicide prevention education provided includes the following:  Suicide risk factors  Suicide prevention and interventions  National Suicide Hotline telephone number  [facility] assessment telephone number  [facility] Emergency Assistance 911  [facility] and/or Residential Mobile Crisis Unit telephone number  Request made of family/significant other to:  Remove weapons (e.g., guns, rifles, knives), all items previously/currently identified as safety concern.    Remove drugs/medications (over-the-counter, prescriptions, illicit drugs), all items previously/currently identified as a safety concern.  The family member/significant other verbalizes understanding of the suicide prevention education information provided.  The family member/significant other agrees to remove the items of safety concern listed above.  Daryel Gerald B 07/07/2013, 1:41 PM

## 2013-07-07 NOTE — Progress Notes (Signed)
D: Patient denies SI/HI and auditory and visual hallucinations. Patient has a depressed mood and affect. The patient is still requesting discharge today. The patient reports still being upset at not being able to see family and friends whenever she wants to. The patient maintains that there is "nothing wrong" with her and that she is "good enough to go." The patient reports sleeping fairly well and states that her appetite is good and her energy level is high. The patient is interacting within the milieu appropriately and attending groups.  A: Patient given emotional support from RN. Patient encouraged to come to staff with concerns and/or questions. Patient's medication routine continued. Patient's orders and plan of care reviewed. Patient referred to MD for discharge concerns.  R: Patient remains cooperative. Will continue to monitor patient q15 minutes for safety.

## 2013-07-07 NOTE — Progress Notes (Signed)
[facility] MD Progress Note  07/07/2013 10:51 AM [name]  MRN:  [id number] Subjective: " I am anxious to go home to my family." Objective: Patient reports decreased anxiety, mood swings and depressive symptoms. She is compliant with her medications and did not report any adverse reactions.  Diagnosis:  Axis I:Bipolar affective disorder, depressed, moderate degree   ADL's:  Intact  Sleep: Fair  Appetite:  Fair  Suicidal Ideation: denies  Homicidal Ideation: denies  AEB (as evidenced by):  Psychiatric Specialty Exam: Review of Systems  Constitutional: Negative.   HENT: Negative.   Eyes: Negative.   Respiratory: Negative.   Cardiovascular: Negative.   Gastrointestinal: Negative.   Genitourinary: Negative.   Musculoskeletal: Negative.   Skin: Negative.   Neurological: Negative.   Endo/Heme/Allergies: Negative.   Psychiatric/Behavioral: Positive for depression. The patient is nervous/anxious and has insomnia.     Blood pressure 118/83, pulse 86, temperature 98 F (36.7 C), temperature source Oral, resp. rate 17, height 5\' 5"  (1.651 m), weight 106.142 kg (234 lb), last menstrual period 06/20/2013.Body mass index is 38.94 kg/(m^2).  General Appearance: Fairly Groomed  Patent attorney::  Fair  Speech:  Normal Rate  Volume:  Normal  Mood:  Dysphoric  Affect:  Blunt  Thought Process:  Disorganized  Orientation:  Full (Time, Place, and Person)  Thought Content:  Negative  Suicidal Thoughts:  No  Homicidal Thoughts:  No  Memory:  Immediate;   Fair Recent;   Fair Remote;   Fair  Judgement:  Impaired  Insight:  Shallow  Psychomotor Activity:  Normal  Concentration:  Fair  Recall:  Fair  Akathisia:  No  Handed:  Right  AIMS (if indicated):     Assets:  Communication Skills Desire for Improvement Social Support  Sleep:  Number of Hours: 6.75   Current Medications: Current Facility-Administered Medications  Medication Dose Route Frequency Provider Last Rate Last Dose   . acetaminophen (TYLENOL) tablet 650 mg  650 mg Oral Q6H PRN Kerry Hough, PA-C      . albuterol (PROVENTIL HFA;VENTOLIN HFA) 108 (90 BASE) MCG/ACT inhaler 2 puff  2 puff Inhalation Q6H PRN Kerry Hough, PA-C      . alum & mag hydroxide-simeth (MAALOX/MYLANTA) 200-200-20 MG/5ML suspension 30 mL  30 mL Oral Q4H PRN Kerry Hough, PA-C      . FLUoxetine (PROZAC) capsule 20 mg  20 mg Oral Daily Floy Riegler   20 mg at 07/07/13 1478  . hydrOXYzine (ATARAX/VISTARIL) tablet 50 mg  50 mg Oral QHS,MR X 1 Kerry Hough, PA-C   50 mg at 07/06/13 2110  . LORazepam (ATIVAN) tablet 0.5 mg  0.5 mg Oral Q8H PRN Kerry Hough, PA-C      . lurasidone (LATUDA) tablet 40 mg  40 mg Oral QPC supper [facility]   40 mg at 07/06/13 1706  . magnesium hydroxide (MILK OF MAGNESIA) suspension 30 mL  30 mL Oral Daily PRN Kerry Hough, PA-C      . OXcarbazepine (TRILEPTAL) tablet 150 mg  150 mg Oral BID Cade Olberding   150 mg at 07/07/13 0728  . traZODone (DESYREL) tablet 50 mg  50 mg Oral QHS PRN Laurelin Elson        Lab Results:  Results for orders placed during the hospital encounter of 07/05/13 (from the past 48 hour(s))  URINE RAPID DRUG SCREEN (HOSP PERFORMED)     Status: None   Collection Time    07/05/13  3:38 PM  Result Value Range   Opiates NONE DETECTED  NONE DETECTED   Cocaine NONE DETECTED  NONE DETECTED   Benzodiazepines NONE DETECTED  NONE DETECTED   Amphetamines NONE DETECTED  NONE DETECTED   Tetrahydrocannabinol NONE DETECTED  NONE DETECTED   Barbiturates NONE DETECTED  NONE DETECTED   Comment:            DRUG SCREEN FOR MEDICAL PURPOSES     ONLY.  IF CONFIRMATION IS NEEDED     FOR ANY PURPOSE, NOTIFY LAB     WITHIN 5 DAYS.                LOWEST DETECTABLE LIMITS     FOR URINE DRUG SCREEN     Drug Class       Cutoff (ng/mL)     Amphetamine      1000     Barbiturate      200     Benzodiazepine   200     Tricyclics       300     Opiates          300     Cocaine           300     THC              50  POCT PREGNANCY, URINE     Status: None   Collection Time    07/05/13  3:47 PM      Result Value Range   Preg Test, Ur NEGATIVE  NEGATIVE   Comment:            THE SENSITIVITY OF THIS     METHODOLOGY IS >24 mIU/mL  ACETAMINOPHEN LEVEL     Status: None   Collection Time    07/05/13  3:55 PM      Result Value Range   Acetaminophen (Tylenol), Serum <15.0  10 - 30 ug/mL   Comment:            THERAPEUTIC CONCENTRATIONS VARY     SIGNIFICANTLY. A RANGE OF 10-30     ug/mL MAY BE AN EFFECTIVE     CONCENTRATION FOR MANY PATIENTS.     HOWEVER, SOME ARE BEST TREATED     AT CONCENTRATIONS OUTSIDE THIS     RANGE.     ACETAMINOPHEN CONCENTRATIONS     >150 ug/mL AT 4 HOURS AFTER     INGESTION AND >50 ug/mL AT 12     HOURS AFTER INGESTION ARE     OFTEN ASSOCIATED WITH TOXIC     REACTIONS.  COMPREHENSIVE METABOLIC PANEL     Status: Abnormal   Collection Time    07/05/13  3:55 PM      Result Value Range   Sodium 138  135 - 145 mEq/L   Potassium 4.0  3.5 - 5.1 mEq/L   Chloride 103  96 - 112 mEq/L   CO2 26  19 - 32 mEq/L   Glucose, Bld 92  70 - 99 mg/dL   BUN 12  6 - 23 mg/dL   Creatinine, Ser 5.62  0.50 - 1.10 mg/dL   Calcium 9.2  8.4 - 13.0 mg/dL   Total Protein 7.2  6.0 - 8.3 g/dL   Albumin 3.7  3.5 - 5.2 g/dL   AST 16  0 - 37 U/L   ALT 17  0 - 35 U/L   Alkaline Phosphatase 93  39 - 117 U/L   Total Bilirubin 0.1 (*) 0.3 -  1.2 mg/dL   GFR calc non Af Amer >90  >90 mL/min   GFR calc Af Amer >90  >90 mL/min   Comment:            The eGFR has been calculated     using the CKD EPI equation.     This calculation has not been     validated in all clinical     situations.     eGFR's persistently     <90 mL/min signify     possible Chronic Kidney Disease.  ETHANOL     Status: None   Collection Time    07/05/13  3:55 PM      Result Value Range   Alcohol, Ethyl (B) <11  0 - 11 mg/dL   Comment:            LOWEST DETECTABLE LIMIT FOR     SERUM  ALCOHOL IS 11 mg/dL     FOR MEDICAL PURPOSES ONLY  SALICYLATE LEVEL     Status: Abnormal   Collection Time    07/05/13  3:55 PM      Result Value Range   Salicylate Lvl <2.0 (*) 2.8 - 20.0 mg/dL  LIPASE, BLOOD     Status: None   Collection Time    07/05/13  3:57 PM      Result Value Range   Lipase 30  11 - 59 U/L  CBC WITH DIFFERENTIAL     Status: None   Collection Time    07/05/13  3:57 PM      Result Value Range   WBC 6.0  4.0 - 10.5 K/uL   RBC 4.42  3.87 - 5.11 MIL/uL   Hemoglobin 13.8  12.0 - 15.0 g/dL   HCT 16.1  09.6 - 04.5 %   MCV 93.7  78.0 - 100.0 fL   MCH 31.2  26.0 - 34.0 pg   MCHC 33.3  30.0 - 36.0 g/dL   RDW 40.9  81.1 - 91.4 %   Platelets 178  150 - 400 K/uL   Neutrophils Relative % 65  43 - 77 %   Neutro Abs 3.8  1.7 - 7.7 K/uL   Lymphocytes Relative 31  12 - 46 %   Lymphs Abs 1.8  0.7 - 4.0 K/uL   Monocytes Relative 4  3 - 12 %   Monocytes Absolute 0.2  0.1 - 1.0 K/uL   Eosinophils Relative 0  0 - 5 %   Eosinophils Absolute 0.0  0.0 - 0.7 K/uL   Basophils Relative 0  0 - 1 %   Basophils Absolute 0.0  0.0 - 0.1 K/uL  URINALYSIS, ROUTINE W REFLEX MICROSCOPIC     Status: Abnormal   Collection Time    07/05/13  7:16 PM      Result Value Range   Color, Urine YELLOW  YELLOW   APPearance CLOUDY (*) CLEAR   Specific Gravity, Urine 1.029  1.005 - 1.030   pH 6.5  5.0 - 8.0   Glucose, UA NEGATIVE  NEGATIVE mg/dL   Hgb urine dipstick NEGATIVE  NEGATIVE   Bilirubin Urine NEGATIVE  NEGATIVE   Ketones, ur NEGATIVE  NEGATIVE mg/dL   Protein, ur NEGATIVE  NEGATIVE mg/dL   Urobilinogen, UA 0.2  0.0 - 1.0 mg/dL   Nitrite NEGATIVE  NEGATIVE   Leukocytes, UA SMALL (*) NEGATIVE  URINE MICROSCOPIC-ADD ON     Status: Abnormal   Collection Time    07/05/13  7:16 PM  Result Value Range   Squamous Epithelial / LPF MANY (*) RARE   Comment: CALLED T LEWIS RN 2006 07/05/13 A [facility]     CORRECTED ON 07/22 AT 2007: PREVIOUSLY REPORTED AS RARE   WBC, UA 0-2  <3 WBC/hpf    Bacteria, UA FEW (*) RARE    Physical Findings: AIMS: Facial and Oral Movements Muscles of Facial Expression: None, normal Lips and Perioral Area: None, normal Jaw: None, normal Tongue: None, normal,Extremity Movements Upper (arms, wrists, hands, fingers): None, normal Lower (legs, knees, ankles, toes): None, normal, Trunk Movements Neck, shoulders, hips: None, normal, Overall Severity Severity of abnormal movements (highest score from questions above): None, normal Incapacitation due to abnormal movements: None, normal Patient's awareness of abnormal movements (rate only patient's report): No Awareness, Dental Status Current problems with teeth and/or dentures?: No Does patient usually wear dentures?: No  CIWA:  CIWA-Ar Total: 0 COWS:  COWS Total Score: 0  Treatment Plan Summary: Daily contact with patient to assess and evaluate symptoms and progress in treatment Medication management  Plan:1. Admit for crisis management and stabilization. 2. Medication management to reduce current symptoms to base line and improve the     patient's overall level of functioning 3. Treat health problems as indicated. 4. Develop treatment plan to decrease risk of relapse upon discharge and the need for     readmission. 5. Psycho-social education regarding relapse prevention and self care. 6. Health care follow up as needed for medical problems. 7. Restart home medications where appropriate.   Medical Decision Making Problem Points:  Established problem, stable/improving (1), Review of last therapy session (1) and Review of psycho-social stressors (1) Data Points:  Order Aims Assessment (2) Review of medication regiment & side effects (2) Review of new medications or change in dosage (2)  I certify that inpatient services furnished can reasonably be expected to improve the patient's condition.   Marynell Bies,MD 07/07/2013, 10:51 AM

## 2013-07-07 NOTE — [facility] Counselor (Signed)
Adult Psychosocial Assessment Update Interdisciplinary Team  [facility] admissions/discharges:  Admissions Discharges  Date: 10/06/12 Date:  Date: 06/01/12 Date:  Date: 04/11/12 Date:  Date: 11/11 Date:  Date: Date:   Changes since the last Psychosocial Assessment (including adherence to outpatient mental health and/or substance abuse treatment, situational issues contributing to decompensation and/or relapse). The patient during admission assessment shows no insight into her reason for being in the hospital stating "I have had depression and SI for months. I don't know why. I don't have it today because its a new day. My mood fluctuates I guess. I don't feel my medications were helping." The patient denies having any triggers or stressors for her depression stating "Everything is fine." When asked about relationships patient states "I'm engaged to a man from [place] that I met on facebook. I've never met him but he is coming to the [place]" Zyaira was unable to process that this could be a dangerous situation for her and denies that her mother is concerned about her online activities. She is currently denying SI/HI/AVH but is interested in having her medications adjusted              Discharge Plan 1. Will you be returning to the same living situation after discharge?   Yes:X  home No:      If no, what is your plan?           2. Would you like a referral for services when you are discharged? Yes:     If yes, for what services?  No:   X  Will retain same providers           Summary and Recommendations (to be completed by the evaluator) Karen Kitchens can benefit from crises stabilization, therapeutic miliey, medication management and coordination with outpt providers.                       Signature:  Ida Rogue, 07/07/2013 8:33 AM

## 2013-07-08 MED ORDER — TRAZODONE HCL 50 MG PO TABS: 50.0000 mg | ORAL_TABLET | Freq: Every evening | ORAL | Status: DC | PRN

## 2013-07-08 MED ORDER — HYDROXYZINE HCL 50 MG PO TABS: 50.0000 mg | ORAL_TABLET | Freq: Every evening | ORAL | Status: DC | PRN

## 2013-07-08 MED ORDER — FLUOXETINE HCL 20 MG PO CAPS: 20.0000 mg | ORAL_CAPSULE | Freq: Every day | ORAL | Status: DC

## 2013-07-08 MED ORDER — LORAZEPAM 0.5 MG PO TABS: 0.5000 mg | ORAL_TABLET | Freq: Three times a day (TID) | ORAL | Status: DC | PRN

## 2013-07-08 MED ORDER — OXCARBAZEPINE 150 MG PO TABS: 150.0000 mg | ORAL_TABLET | Freq: Two times a day (BID) | ORAL | Status: DC

## 2013-07-08 MED ORDER — LURASIDONE HCL 40 MG PO TABS: 40.0000 mg | ORAL_TABLET | Freq: Every day | ORAL | Status: DC

## 2013-07-08 MED ORDER — ALBUTEROL SULFATE HFA 108 (90 BASE) MCG/ACT IN AERS: 2.0000 | INHALATION_SPRAY | Freq: Four times a day (QID) | RESPIRATORY_TRACT | Status: DC | PRN

## 2013-07-08 NOTE — Progress Notes (Signed)
[facility] Case Management Discharge Plan :  Will you be returning to the same living situation after discharge: Yes,  home with mother At discharge, do you have transportation home?:Yes,  mother Do you have the ability to pay for your medications:Yes,  medicare  Release of information consent forms completed and in the chart;  Patient's signature needed at discharge.  Patient to Follow up at: Follow-up Information   Follow up with [facility] On 07/18/2013. (3:30PM with Corie Chiquito)    Contact information:   [address]  [place]  [336] [phone]      Patient denies SI/HI:   Yes,  self report    Safety Planning and Suicide Prevention discussed:  Yes,  SPE completed with pt's mother, [facility], Dalbert Stillings 07/08/2013, 11:43 AM

## 2013-07-08 NOTE — Progress Notes (Signed)
D: Pt is blunted in affect and depressed in mood. Pt was observed with minimum interaction within the milieu. She refused to attend group this evening, despite encouragement. In the dayroom this pt wanted this writer to speak to her mom immediately on the phone about a situation that just occurred between her and another pt. Pt was informed that I have not been informed of the situation and therefore cannot speak on it at this time to her mom. I told the patient that I can verify her release of information sheet and speak to her mom at the nursing station in confidentially. Pt's mom never called the unit after the patient got off of the phone. Writer went to the other pt who was able to verbalize to the pt that he had no desire to harm her. The pt above reported that another pt was starring at her and in return she asked, "what was he looking at". Writer then spoke with both involved pt's about the misinterpretations of non-verbal communication. Writer then verbally asked pt if she felt like there was a continue threat to her safety. Pt denied. No further animosity was observed from the patients.  A: Writer administered scheduled medications to pt. Continued support and availability as needed was extended to this pt. Staff continue to monitor pt with q38min checks.  R: No adverse drug reactions noted. Pt receptive to treatment. Pt remains safe at this time.

## 2013-07-08 NOTE — [facility] Suicide Risk Assessment (Signed)
Suicide Risk Assessment  Discharge Assessment     Demographic Factors:  Low socioeconomic status, Unemployed and female  Mental Status Per Nursing Assessment::   On Admission:  NA  Current Mental Status by Physician: Pt reports thpatient denies suicidal ideation, intent or plan  Loss Factors: Financial problems/change in socioeconomic status  Historical Factors: Impulsivity  Risk Reduction Factors:   Sense of responsibility to family, Living with another person, especially a relative and Positive social support  Continued Clinical Symptoms:  Resolving mood symptoms  Cognitive Features That Contribute To Risk:  Closed-mindedness Polarized thinking    Suicide Risk:  Minimal: No identifiable suicidal ideation.  Patients presenting with no risk factors but with morbid ruminations; may be classified as minimal risk based on the severity of the depressive symptoms  Discharge Diagnoses:   AXIS I:  Bipolar affective disorder, depressed, moderate degree  AXIS II:  Deferred AXIS III:   Past Medical History  Diagnosis Date  . Asthma   . UTI (urinary tract infection)     Completed Cipro 08/06/12  . Gastritis    AXIS IV:  other psychosocial or environmental problems and problems related to social environment AXIS V:  61-70 mild symptoms  Plan Of Care/Follow-up recommendations:  Activity:  as tolerated Diet:  healthy Tests:  routine Other:  patient to keep her after care appointment  Is patient on multiple antipsychotic therapies at discharge:  No   Has Patient had three or more failed trials of antipsychotic monotherapy by history:  No  Recommended Plan for Multiple Antipsychotic Therapies: N/A  Nichole Avellino,MD 07/08/2013, 11:28 AM

## 2013-07-08 NOTE — Progress Notes (Signed)
Pt discharged per MD orders; pt currently denies SI/HI and auditory/visual hallucinations; pt was given education by RN regarding follow-up appointments and medications and pt denied any questions or concerns about these instructions; pt was then escorted to search room to retrieve her belongings by RN before being discharged to hospital lobby. 

## 2013-07-08 NOTE — Discharge Summary (Signed)
Physician Discharge Summary Note  Patient:  [name] is an 27 y.o., female MRN:  [id number] DOB:  12-11-1986 Patient phone:  [phone] (home)  Patient address:   [address] [place] [address]   Date of Admission:  07/05/2013 Date of Discharge: 07/08/13  Discharge Diagnoses: Principal Problem:   Bipolar affective disorder, depressed, moderate degree  Axis Diagnosis:  AXIS I: Bipolar affective disorder, depressed, moderate degree  AXIS II: Deferred  AXIS III:  Past Medical History   Diagnosis  Date   .  Asthma    .  UTI (urinary tract infection)      Completed Cipro 08/06/12   .  Gastritis    AXIS IV: other psychosocial or environmental problems and problems related to social environment  AXIS V: 61-70 mild symptoms  Level of Care:  OP  Hospital Course:   [name] is an 27 y.o. female presents voluntarily as a walk in to [facility] accompanied by her mother. The patient reports that she has been compliant with her medications but has been becoming increasingly depressed with thoughts of hanging herself. The patient's mother is very concerned about her and feels her mood has been worsening. Her mother expressed concern that the patient was impulsively meeting men on social media sites therefore jeopardizing the safety of the patient and family.  The patient during admission assessment shows no insight into her reason for being in the hospital stating "I have had depression and SI for months. I don't know why. I don't have it today because its a new day. My mood fluctuates I guess. I don't feel my medications were helping." The patient denies having any triggers or stressors for her depression stating "Everything is fine." When asked about relationships patient states "I'm engaged to a man from [place] that I met on facebook. I've never met him but he is coming to the [place]" [name] was unable to process that this could be a dangerous situation for her  and denies that her mother is concerned about her online activities. She is currently denying SI/HI/AVH but is interested in having her medications adjusted. She reports that her main depressive symptoms is hypersomnia, crying episodes and overeating.   While a patient in this hospital, [name] was enrolled in group counseling and activities as well as received the following medication Current facility-administered medications:acetaminophen (TYLENOL) tablet 650 mg, 650 mg, Oral, Q6H PRN, Mena Goes Simon, PA-C;  albuterol (PROVENTIL HFA;VENTOLIN HFA) 108 (90 BASE) MCG/ACT inhaler 2 puff, 2 puff, Inhalation, Q6H PRN, Kerry Hough, PA-C;  alum & mag hydroxide-simeth (MAALOX/MYLANTA) 200-200-20 MG/5ML suspension 30 mL, 30 mL, Oral, Q4H PRN, Mena Goes Simon, PA-C FLUoxetine (PROZAC) capsule 20 mg, 20 mg, Oral, Daily, Mojeed Akintayo, 20 mg at 07/08/13 4696;  hydrOXYzine (ATARAX/VISTARIL) tablet 50 mg, 50 mg, Oral, QHS,MR X 1, Spencer E Simon, PA-C, 50 mg at 07/07/13 2113;  LORazepam (ATIVAN) tablet 0.5 mg, 0.5 mg, Oral, Q8H PRN, Mena Goes Simon, PA-C;  lurasidone (LATUDA) tablet 40 mg, 40 mg, Oral, QPC supper, Mojeed Akintayo, 40 mg at 07/07/13 1622 magnesium hydroxide (MILK OF MAGNESIA) suspension 30 mL, 30 mL, Oral, Daily PRN, Mena Goes Simon, PA-C;  OXcarbazepine (TRILEPTAL) tablet 150 mg, 150 mg, Oral, BID, Mojeed Akintayo, 150 mg at 07/08/13 2952;  traZODone (DESYREL) tablet 50 mg, 50 mg, Oral, QHS PRN, Mojeed Akintayo The patient had multiple medication changes on admission as she reported that her current regimen was not helping. Her Celexa  20 mg was changed to Prozac 20 mg daily for depression. Her Tegretol was not continued. The patient was started on Latuda 40 mg daily to help with symptoms of Bipolar Depression and also Trileptal 150 mg BID to help with mood stability. The patient began to report an improvement of her symptoms on her second day of admission and became fixated on leaving the  hospital. The case manager contacted the patient's mother prior to discharge to obtain collateral information. The patient denied any SI and her behavior was stable on the unit. [name] admitted that she was anxious to resume her online activities. Patient attended treatment team meeting this am and met with treatment team members. Pt symptoms, treatment plan and response to treatment discussed. [name] endorsed that their symptoms have improved. Pt also stated that they are stable for discharge.  In other to control Principal Problem:   Bipolar affective disorder, depressed, moderate degree , they will continue psychiatric care on outpatient basis. They will follow-up at  Follow-up Information   Follow up with [facility] On 07/18/2013. (3:30PM with Corie Chiquito)    Contact information:   [address]  [place]  [phone]    .  In addition they were instructed to take all your medications as prescribed by your mental healthcare provider, to report any adverse effects and or reactions from your medicines to your outpatient provider promptly, patient is instructed and cautioned to not engage in alcohol and or illegal drug use while on prescription medicines, in the event of worsening symptoms, patient is instructed to call the crisis hotline, 911 and or go to the nearest ED for appropriate evaluation and treatment of symptoms.   Upon discharge, patient adamantly denies suicidal, homicidal ideations, auditory, visual hallucinations and or delusional thinking. They left [facility] with all personal belongings in no apparent distress.  Consults:  See electronic record for details  Significant Diagnostic Studies:  See electronic record for details  Discharge Vitals:   Blood pressure 121/89, pulse 99, temperature 98.2 F (36.8 C), temperature source Oral, resp. rate 20, height 5\' 5"  (1.651 m), weight 106.142 kg (234 lb), last menstrual period 06/20/2013..  Mental Status  Exam: See Mental Status Examination and Suicide Risk Assessment completed by Attending Physician prior to discharge.  Discharge destination:  Home  Is patient on multiple antipsychotic therapies at discharge:  No  Has Patient had three or more failed trials of antipsychotic monotherapy by history: N/A Recommended Plan for Multiple Antipsychotic Therapies: N/A     Discharge Orders   Future Appointments Provider Department Dept Phone   08/04/2013 8:00 PM [facility] [phone]   Future Orders Complete By Expires     Activity as tolerated - No restrictions  As directed         Medication List    STOP taking these medications       ARIPiprazole 30 MG tablet  Commonly known as:  ABILIFY     carbamazepine 200 MG tablet  Commonly known as:  TEGRETOL     citalopram 20 MG tablet  Commonly known as:  CELEXA      TAKE these medications     Indication   albuterol 108 (90 BASE) MCG/ACT inhaler  Commonly known as:  PROVENTIL HFA;VENTOLIN HFA  Inhale 2 puffs into the lungs every 6 (six) hours as needed for wheezing.   Indication:  Asthma     FLUoxetine 20  MG capsule  Commonly known as:  PROZAC  Take 1 capsule (20 mg total) by mouth daily. For depression   Indication:  Depressive Phase of Manic-Depression     hydrOXYzine 50 MG tablet  Commonly known as:  ATARAX/VISTARIL  Take 1 tablet (50 mg total) by mouth at bedtime and may repeat dose one time if needed.   Indication:  Sedation, Tension     LORazepam 0.5 MG tablet  Commonly known as:  ATIVAN  Take 1 tablet (0.5 mg total) by mouth every 8 (eight) hours as needed for anxiety.   Indication:  Feeling Anxious     lurasidone 40 MG Tabs  Commonly known as:  LATUDA  Take 1 tablet (40 mg total) by mouth daily after supper. For mood stability.   Indication:  Depressive Phase of Manic-Depression     OXcarbazepine 150 MG tablet  Commonly known as:  TRILEPTAL  Take 1 tablet (150  mg total) by mouth 2 (two) times daily. For mood stability.   Indication:  Manic-Depression     traZODone 50 MG tablet  Commonly known as:  DESYREL  Take 1 tablet (50 mg total) by mouth at bedtime as needed for sleep.   Indication:  Trouble Sleeping       Follow-up Information   Follow up with [facility] On 07/18/2013. (3:30PM with Corie Chiquito)    Contact information:   [address]  [place]  [phone]] [phone]     Follow-up recommendations:   Activities: Resume typical activities Diet: Resume typical diet Tests: none Other: Follow up with outpatient provider and report any side effects to out patient prescriber.  Comments:  Take all your medications as prescribed by your mental healthcare provider. Report any adverse effects and or reactions from your medicines to your outpatient provider promptly. Patient is instructed and cautioned to not engage in alcohol and or illegal drug use while on prescription medicines. In the event of worsening symptoms, patient is instructed to call the crisis hotline, 911 and or go to the nearest ED for appropriate evaluation and treatment of symptoms. Follow-up with your primary care provider for your other medical issues, concerns and or health care needs.  SignedFransisca Kaufmann NP-C 07/08/2013 2:41 PM

## 2013-07-08 NOTE — Progress Notes (Addendum)
Recreation Therapy Notes  Date: 07.25.2014 Time: 9:30am Location: [address] Dayroom  Group Topic: Memory/Cognition  Goal Area(s) Addresses:  Patient will verbalize understanding of importance of working on memory/cognition. Patient will successfully repeat sequence performed by LRT.   Behavioral Response: Engaged, Attentive, Limited Processing  Intervention: Memory Recall  Activity: Color Block & Memory Sequence. Patients were shown pieces of paper with colors written on them, the colors were written in a different color than the word. For example: blue written in the color green. Patients were asked to identify both the color written and the color the word was written in. Patients were asked to complete a sequence of actions created by LRT.   Education: Cognitive health, Discharge planning, Personal Health   Education Outcome: Needs additional education  Clinical Observations/Feedback: Patient was successful at color block activity. Patient tolerated peer answered more quickly than she could. Patient was not successful at repeating sequence created. Patient verbalized that she understood effect of memory health to managing therapeutic appointments as well as medication compliance. Additionally patient nodded in agreement when group discussion the importance of remembering personal hygiene tasks. Patient participated in activity, however she was more interested in LRT telling her about adolescent patients at [facility], inquiring primarily about the ages of the patients on the adolescent unit.   [name], LRT/CTRS  Jearl Klinefelter 07/08/2013 12:49 PM

## 2013-07-08 NOTE — Discharge Instructions (Signed)
Emotional Crisis Part of your problem today may be due to an emotional crisis. Emotional states can cause many different physical signs and symptoms. These may include:  Chest or stomach pain.  Fluttering heartbeat.  Passing out.  Breathing difficulty.  Headaches.  Trembling.  Hot or cold flashes.  Numbness.  Dizziness.  Unusual muscle pain or fatigue.  Insomnia. When you have other medical problems, they are often made worse by emotional upsets. Emotional crises can increase your stress and anxiety. Finding ways to reduce your stress level can make you feel better. You will become more capable of dealing with these emotional states. Regular physical exercise such as walking can be very beneficial. Counseling or medicine to treat anxiety or depression may also be needed. See your caregiver if you have further problems or questions about your condition. Document Released: 12/01/2005 Document Revised: 02/23/2012 Document Reviewed: 05/18/2007 [facility] Patient Information 2014 [place], [place].  Manic Depressive Illness Manic depressive illness (manic depression) is called a bipolar disorder because patients with this illness have both ends of the range of feelings. They may feel as though they are in a deep hole during the depression phase and feel unable to get out of what they believe is a hopeless situation. During the manic phase they feel as though they are full of energy and can accomplish anything with their boundless energy. Many lives are ruined by this disease; and without effective treatment, the illness is connected with an increased risk of suicide. Bipolar disorder is a serious brain disease that causes extreme shifts in mood, energy, and functioning. It affects about 2.3 million adult Americans. This is about 1.2 percent of the population. Men and women are equally likely to develop this illness. The disorder usually starts in adolescence or early adulthood, but can start in  childhood. This illness may be passed from your parents but the gene causing this illness has not been found. Cycles, or episodes, of depression, mania, or "mixed" manic and depressive symptoms often recur (come back) and may become more frequent. This illness can disrupt work, school, family, and social life. It is important to give your caregiver a complete picture of what has been happening. Help is often looked for during the depression phase. If treatment for depression is started, some of the antidepressant medications can actually make things worse. Antidepressants can trigger mania with a worsening of the illness. There are a number of medications which work well with this disease and your caregiver can help you find the medication or combination of medications which will work best for you.  SYMPTOMS  MANIA Abnormally and persistently elevated (high) mood or irritability and aggressiveness, accompanied by at least three of the following symptoms:  Overly-inflated self-esteem (You think a lot of yourself like a show-off)  Decreased need for sleep (You feel so full of energy that it seems as if you do not need sleep)  Increased talkativeness  Racing thoughts (Your ideas and thoughts may jump from one to the other in an endless stream)  Distractibility (It is difficult to keep your mind on one subject.)  Increased goal-directed activity such as shopping  Physical agitation (You may find it difficult to sit still)  Excessive involvement in risky or reckless behaviors or activities, such as spending sprees, poor business decisions, and sexual indiscretions  Poor judgment and decision making. (Your decisions are not normal or sensible)  Impulsiveness (You react quickly in an instant without thinking things through) DEPRESSION Symptoms include:  Loss of interest  or pleasure in activities that were once enjoyed  Significant change in appetite or body weight  Difficulty sleeping, or  oversleeping  Physical slowing or agitation  Loss of energy  Feelings of worthlessness or inappropriate guilt  Difficulty thinking or concentrating; poor decision making abilities  Feelings of inadequacy and low self esteem (You may feel as though you are worth nothing)  Prolonged periods of sadness without apparent cause  Unexplained crying spells  Withdrawal from usual friends and family (You may spend more time alone)  Recurrent thoughts of death and suicide MIXED STATE Symptoms of mania and depression are present at the same time. The symptom picture frequently includes:  Agitation  Trouble sleeping  Significant change in appetite  Psychosis  Suicidal thinking BIPOLAR DISORDER WITH RAPID RECYCLING Especially early in the course of illness, the episodes may be separated by periods of wellness during which a person suffers few to no symptoms. When four or more episodes of illness occur within a 32-month period, the person is said to have bipolar disorder with rapid cycling. Bipolar disorder is often complicated by co-occurring alcohol or substance abuse. PSYCHOSIS Severe depression or mania may be accompanied by symptoms of psychosis. These symptoms include: hallucinations (hearing, seeing, or otherwise sensing the presence of stimuli that are not there) and delusions (false personal beliefs that are not subject to reason or contradictory evidence, and are not explained by a person's cultural concepts). Psychotic symptoms associated with bipolar typically reflect the extreme mood state at the time. TREATMENT  Many of the above problems sound awful. The good news is that if you work with your caregivers and let them know what is wrong, they can usually help you.  A variety of medications are used to treat bipolar disorder, but even with the best medication treatment, many people with the illness have some residual (left over) symptoms. Certain types of psychotherapy or  psychosocial interventions, in combination with medication, often can provide additional benefits. These include cognitive-behavioral therapy, interpersonal and social rhythm therapy, family therapy, and psychoeducation. Your caregiver can explain these therapies to you.  Lithium has long been used as a first-line treatment for bipolar disorder. It has been an effective mood-stabilizing medication for many people with bipolar disorder.  Some anticonvulsant medications have been used as alternatives to lithium in many cases. Some research suggests that different combinations of lithium and anticonvulsants may be helpful.  According to studies conducted in [place] in patients with epilepsy, valproate may increase testosterone levels in teenage girls and produce polycystic ovary syndrome in women who began taking the medication before age 69. Increased testosterone can lead to polycystic ovary syndrome with irregular or absent menses (menstrual periods), obesity (being very overweight), and abnormal growth of hair. Therefore, young female patients taking valproate should be watched carefully by a physician.  During a depressive episode, people with bipolar disorder commonly require additional treatment with antidepressant medication. Typically, lithium or anticonvulsant mood stabilizers are prescribed along with an antidepressant to protect against a switch into mania or rapid cycling. In some cases, the newer, atypical antipsychotic drugs may help relieve severe symptoms of bipolar disorder and prevent the return of mania. More research is needed to establish the safety and efficacy of atypical antipsychotics as long-term treatments for this disorder. Document Released: 02/27/2004 Document Revised: 02/23/2012 Document Reviewed: 12/01/2005 [facility] Patient Information 2014 [place], [place].

## 2013-07-11 ENCOUNTER — Ambulatory Visit (INDEPENDENT_AMBULATORY_CARE_PROVIDER_SITE_OTHER): Payer: Medicare Other | Admitting: Internal Medicine

## 2013-07-11 ENCOUNTER — Encounter: Payer: Self-pay | Admitting: Internal Medicine

## 2013-07-11 VITALS — BP 108/76 | HR 76 | Temp 97.5°F | Ht 65.0 in | Wt 240.9 lb

## 2013-07-11 DIAGNOSIS — IMO0002 Reserved for concepts with insufficient information to code with codable children: Secondary | ICD-10-CM | POA: Insufficient documentation

## 2013-07-11 DIAGNOSIS — T7421XA Adult sexual abuse, confirmed, initial encounter: Secondary | ICD-10-CM

## 2013-07-11 DIAGNOSIS — K5900 Constipation, unspecified: Secondary | ICD-10-CM

## 2013-07-11 NOTE — Patient Instructions (Signed)
Please follow up with your pcp and continue to see your counselor and psychiatrist and follow up with your PCP.  If you change your mind about having social work involved, please let us know

## 2013-07-11 NOTE — Discharge Summary (Signed)
Seen and agreed. Lynasia Meloche, MD 

## 2013-07-11 NOTE — Assessment & Plan Note (Addendum)
Recent hospital admission last week.  Change in medications including prozac, latuda, and trileptal.  Has follow up with psychiatrist next week.  Currently denies any suicidal or homicidal ideation.

## 2013-07-11 NOTE — Assessment & Plan Note (Signed)
Continues to complain of rectal pain when straining that resolves after 10-20 minutes.  Reports hard stools.  She has not tried otc fiber or stool softener.  Recommended again, possible relief with metamucil if she is able to purchase.  Last colonoscopy April 2014 was un-revealing of possible cause with benign colonic mucosa.  -metamucil prn
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
## 2013-07-12 NOTE — Progress Notes (Signed)
Case discussed with Dr. Qureshi at the time of the visit.  We reviewed the resident's history and exam and pertinent patient test results.  I agree with the assessment, diagnosis, and plan of care documented in the resident's note. 

## 2013-07-13 NOTE — Progress Notes (Signed)
Patient Discharge Instructions:  After Visit Summary (AVS):   Faxed to:  07/14/13 Discharge Summary Note:   Faxed to:  07/14/13 Psychiatric Admission Assessment Note:   Faxed to:  07/14/13 Suicide Risk Assessment - Discharge Assessment:   Faxed to:  07/14/13 Faxed/Sent to the Next Level Care provider:  07/14/13 Faxed to [facility] @ [phone]  Jerelene Redden, 07/13/2013, 4:09 PM

## 2013-07-17 ENCOUNTER — Encounter (HOSPITAL_COMMUNITY): Payer: Self-pay

## 2013-07-17 ENCOUNTER — Emergency Department (INDEPENDENT_AMBULATORY_CARE_PROVIDER_SITE_OTHER)
Admission: EM | Admit: 2013-07-17 | Discharge: 2013-07-17 | Disposition: A | Discharge status: Home / Self Care | Payer: Medicare Other | Source: Home / Self Care

## 2013-07-17 DIAGNOSIS — J029 Acute pharyngitis, unspecified: Secondary | ICD-10-CM

## 2013-07-17 LAB — POCT RAPID STREP A: Streptococcus, Group A Screen (Direct): NEGATIVE

## 2013-07-17 NOTE — ED Notes (Signed)
C/o sore throat states started yesterday, declined swab

## 2013-07-17 NOTE — ED Provider Notes (Signed)
CSN: [id number]     Arrival date & time 07/17/13  1232 History     First MD Initiated Contact with Patient 07/17/13 1256     Chief Complaint  Patient presents with  . Sore Throat   (Consider location/radiation/quality/duration/timing/severity/associated sxs/prior Treatment) HPI  27 yo wf presents with a one day hx of the above complaint.  Was feeling fine before onset.  Pain with swallowing but no dysphagia or dyspnea.  103 temp last night.  Has been using tylenol.  Denies chills cough, headache, dizziness.cp.  One episode of vomiting.  No hematochezia.  States her mother told her that the number one cause of sore throat is the Ebola virus and she is concerned about this.    Past Medical History  Diagnosis Date  . Bipolar disorder   . Attention deficit hyperactivity disorder   . Depression   . Anxiety   . Asthma   . UTI (urinary tract infection)     Completed Cipro 08/06/12  . Gastritis    Past Surgical History  Procedure Laterality Date  . Mouth surgery    . Eye muscle surgery Bilateral     07/29/12   Family History  Problem Relation Age of Onset  . Depression Mother   . Depression Father   . Depression Brother   . Colon cancer Mother   . Colon polyps Mother   . Liver disease    . Kidney disease     History  Substance Use Topics  . Smoking status: Never Smoker   . Smokeless tobacco: Never Used  . Alcohol Use: No   OB History   Grav Para Term Preterm Abortions TAB SAB Ect Mult Living   0 0             Review of Systems  Constitutional: Positive for fever. Negative for chills, activity change and appetite change.  HENT: Positive for sore throat and trouble swallowing. Negative for hearing loss, ear pain, congestion, rhinorrhea, voice change, postnasal drip, sinus pressure and ear discharge.   Eyes: Negative.   Respiratory: Negative for apnea, cough, choking, chest tightness, shortness of breath and wheezing.   Gastrointestinal: Positive for vomiting (one episode).   Endocrine: Negative.   Genitourinary: Negative.   Musculoskeletal: Negative.   Skin: Negative.   Allergic/Immunologic: Negative.   Neurological: Negative.     Allergies  Depakote; Methylphenidate derivatives; and Ritalin  Home Medications   Current Outpatient Rx  Name  Route  Sig  Dispense  Refill  . albuterol (PROVENTIL HFA;VENTOLIN HFA) 108 (90 BASE) MCG/ACT inhaler   Inhalation   Inhale 2 puffs into the lungs every 6 (six) hours as needed for wheezing.   1 Inhaler   11   . FLUoxetine (PROZAC) 20 MG capsule   Oral   Take 1 capsule (20 mg total) by mouth daily. For depression   30 capsule   0   . hydrOXYzine (ATARAX/VISTARIL) 50 MG tablet   Oral   Take 1 tablet (50 mg total) by mouth at bedtime and may repeat dose one time if needed.   30 tablet   0   . LORazepam (ATIVAN) 0.5 MG tablet   Oral   Take 1 tablet (0.5 mg total) by mouth every 8 (eight) hours as needed for anxiety.   30 tablet   0   . lurasidone (LATUDA) 40 MG TABS   Oral   Take 1 tablet (40 mg total) by mouth daily after supper. For mood stability.   30  tablet   0   . OXcarbazepine (TRILEPTAL) 150 MG tablet   Oral   Take 1 tablet (150 mg total) by mouth 2 (two) times daily. For mood stability.   30 tablet   0   . traZODone (DESYREL) 50 MG tablet   Oral   Take 1 tablet (50 mg total) by mouth at bedtime as needed for sleep.   30 tablet   0    BP 111/71  Pulse 63  Temp(Src) 99.3 F (37.4 C) (Oral)  Resp 18  SpO2 98%  LMP 07/16/2013 Physical Exam  Constitutional: She is oriented to person, place, and time. She appears well-developed. No distress.  HENT:  Head: Normocephalic and atraumatic.  Nose: Nose normal.  Mouth/Throat: No oropharyngeal exudate.  Slight redness throat.  No exudates.  No swelling.    Eyes: Conjunctivae and EOM are normal. Pupils are equal, round, and reactive to light.  Neck: Normal range of motion.  Cardiovascular: Normal rate, regular rhythm and normal heart  sounds.   Pulmonary/Chest: Effort normal and breath sounds normal. No respiratory distress. She has no wheezes. She has no rales.  Musculoskeletal: Normal range of motion.  Lymphadenopathy:    She has no cervical adenopathy.  Neurological: She is alert and oriented to person, place, and time.  Skin: Skin is warm and dry.    ED Course   Procedures (including critical care time)  Labs Reviewed  CULTURE, GROUP A STREP  POCT RAPID STREP A ([facility]RG CARE ONLY)   No results found. 1. Acute viral pharyngitis     MDM  Will treat conservatively with ibuprofen prn for pain and fever.  Salt water gargles.  Advised rapid strep is negative.   Reassured patient that her sore throat is not from the Ebola virus.  All questions answered.   Zonia Kief, PA-C 07/17/13 1341

## 2013-07-17 NOTE — ED Provider Notes (Signed)
Medical screening examination/treatment/procedure(s) were performed by non-physician practitioner and as supervising physician I was immediately available for consultation/collaboration.  Akshaya Toepfer, M.D.  Johanne Mcglade C Caydan Mctavish, MD 07/17/13 1619 

## 2013-07-17 NOTE — Discharge Instructions (Signed)
Salt Water Gargle This solution will help make your mouth and throat feel better. HOME CARE INSTRUCTIONS   Mix 1 teaspoon of salt in 8 ounces of warm water.  Gargle with this solution as much or often as you need or as directed. Swish and gargle gently if you have any sores or wounds in your mouth.  Do not swallow this mixture. Document Released: 09/04/2004 Document Revised: 02/23/2012 Document Reviewed: 01/26/2009 [facility] Patient Information 2014 [place], [place].  Viral Pharyngitis Viral pharyngitis is a viral infection that produces redness, pain, and swelling (inflammation) of the throat. It can spread from person to person (contagious). CAUSES Viral pharyngitis is caused by inhaling a large amount of certain germs called viruses. Many different viruses cause viral pharyngitis. SYMPTOMS Symptoms of viral pharyngitis include:  Sore throat.  Tiredness.  Stuffy nose.  Low-grade fever.  Congestion.  Cough. TREATMENT Treatment includes rest, drinking plenty of fluids, and the use of over-the-counter medication (approved by your caregiver). HOME CARE INSTRUCTIONS   Drink enough fluids to keep your urine clear or pale yellow.  Eat soft, cold foods such as ice cream, frozen ice pops, or gelatin dessert.  Gargle with warm salt water (1 tsp salt per 1 qt of water).  If over age 16, throat lozenges may be used safely.  Only take over-the-counter or prescription medicines for pain, discomfort, or fever as directed by your caregiver. Do not take aspirin. To help prevent spreading viral pharyngitis to others, avoid:  Mouth-to-mouth contact with others.  Sharing utensils for eating and drinking.  Coughing around others. SEEK MEDICAL CARE IF:   You are better in a few days, then become worse.  You have a fever or pain not helped by pain medicines.  There are any other changes that concern you. Document Released: 09/10/2005 Document Revised: 02/23/2012 Document Reviewed:  02/06/2011 [facility] Patient Information 2014 [place], [place].

## 2013-07-19 ENCOUNTER — Telehealth: Payer: Self-pay

## 2013-07-19 LAB — CULTURE, GROUP A STREP

## 2013-07-20 ENCOUNTER — Encounter (HOSPITAL_BASED_OUTPATIENT_CLINIC_OR_DEPARTMENT_OTHER): Payer: Self-pay

## 2013-07-20 NOTE — Telephone Encounter (Signed)
Spoke to this pt we believe that the sleep ctr called her about her study tonight she was given to # to sleep lab [name]

## 2013-08-02 ENCOUNTER — Encounter: Payer: Self-pay | Admitting: Internal Medicine

## 2013-08-02 ENCOUNTER — Telehealth: Payer: Self-pay | Admitting: *Deleted

## 2013-08-02 ENCOUNTER — Ambulatory Visit: Payer: Self-pay

## 2013-08-02 NOTE — Telephone Encounter (Signed)
Pt c/o back and neck pain for 2 days. No known injury. Not taking any meds for this pain.  Nothing helps make this pain better.  New onset.  Pain is constant, rates 81/2. Also c/o Chest hurting, mid sternal. Non radiating. Onset today about 1000. Constant.  Rates pain 6/10 States she has nausea, sweating, dizziness when questioned.  Denies SOB

## 2013-08-02 NOTE — Telephone Encounter (Signed)
Pt scheduled for 2:15 today.  Advised ED if needed before appointment.

## 2013-08-02 NOTE — Telephone Encounter (Signed)
This is not a new complaint - CP is on PL and was seen recently for it. Therefore, OK to sch appt this afternoon with [facility].

## 2013-08-04 ENCOUNTER — Encounter (HOSPITAL_BASED_OUTPATIENT_CLINIC_OR_DEPARTMENT_OTHER): Payer: Self-pay

## 2013-08-07 ENCOUNTER — Emergency Department (HOSPITAL_COMMUNITY)
Admission: EM | Admit: 2013-08-07 | Discharge: 2013-08-07 | Discharge status: Absent without leave | Payer: Medicare Other | Source: Home / Self Care

## 2013-08-16 ENCOUNTER — Telehealth: Payer: Self-pay | Admitting: Licensed Clinical Social Worker

## 2013-08-16 ENCOUNTER — Encounter: Payer: Self-pay | Admitting: Internal Medicine

## 2013-08-16 NOTE — Telephone Encounter (Signed)
[name] placed call into [facility] today requesting a signed FL-2.  Pt states she is going into a group home but was unable to recall the name.  CSW obtained phone number of group home from pt and called.  Pt is seeking admission today to [facility], [address] [place] [address].  CSW spoke with coordinator [phone], who is expecting admission today.  Facility requesting signed FL-2, prescriptions and H&P if possible.  Discussed with attending.  Pt no-showed most recent [facility] appt with PCP and PCP is not in the office today.  Pt scheduled with Dr.Jones today at 1515 for completion of FL-2.  CSW discussed with Ms. Kimberling, pt able to come today for appointment. Pt aware appt is for the completion of her FL-2.  Form initiated and placed in Dr. Yetta Barre' mailbox.

## 2013-08-20 ENCOUNTER — Encounter (HOSPITAL_COMMUNITY): Payer: Self-pay | Admitting: *Deleted

## 2013-08-20 ENCOUNTER — Emergency Department (INDEPENDENT_AMBULATORY_CARE_PROVIDER_SITE_OTHER)
Admission: EM | Admit: 2013-08-20 | Discharge: 2013-08-20 | Disposition: A | Discharge status: Home / Self Care | Payer: Medicare Other | Source: Home / Self Care | Attending: Family Medicine | Admitting: Family Medicine

## 2013-08-20 DIAGNOSIS — K529 Noninfective gastroenteritis and colitis, unspecified: Secondary | ICD-10-CM

## 2013-08-20 DIAGNOSIS — K5289 Other specified noninfective gastroenteritis and colitis: Secondary | ICD-10-CM

## 2013-08-20 MED ORDER — ONDANSETRON HCL 4 MG PO TABS: 4.0000 mg | ORAL_TABLET | Freq: Four times a day (QID) | ORAL | Status: DC

## 2013-08-20 MED ORDER — ONDANSETRON 4 MG PO TBDP
4.0000 mg | ORAL_TABLET | Freq: Once | ORAL | Status: AC
  Administered 2013-08-20: 4 mg via ORAL

## 2013-08-20 NOTE — ED Provider Notes (Signed)
CSN: [id number]     Arrival date & time 08/20/13  1258 History   First MD Initiated Contact with Patient 08/20/13 1406     Chief Complaint  Patient presents with  . Abdominal Pain   (Consider location/radiation/quality/duration/timing/severity/associated sxs/prior Treatment) Patient is a 27 y.o. female presenting with abdominal pain. The history is provided by the patient.  Abdominal Pain This is a new problem. The current episode started 6 to 12 hours ago. The problem has not changed since onset.Associated symptoms include abdominal pain.    Past Medical History  Diagnosis Date  . Bipolar disorder   . Attention deficit hyperactivity disorder   . Depression   . Anxiety   . Asthma   . UTI (urinary tract infection)     Completed Cipro 08/06/12  . Gastritis    Past Surgical History  Procedure Laterality Date  . Mouth surgery    . Eye muscle surgery Bilateral     07/29/12   Family History  Problem Relation Age of Onset  . Depression Mother   . Depression Father   . Depression Brother   . Colon cancer Mother   . Colon polyps Mother   . Liver disease    . Kidney disease     History  Substance Use Topics  . Smoking status: Never Smoker   . Smokeless tobacco: Never Used  . Alcohol Use: No   OB History   Grav Para Term Preterm Abortions TAB SAB Ect Mult Living   0 0             Review of Systems  Constitutional: Negative.   Gastrointestinal: Positive for nausea, vomiting and abdominal pain. Negative for diarrhea, constipation and blood in stool.    Allergies  Depakote; Methylphenidate derivatives; and Ritalin  Home Medications   Current Outpatient Rx  Name  Route  Sig  Dispense  Refill  . albuterol (PROVENTIL HFA;VENTOLIN HFA) 108 (90 BASE) MCG/ACT inhaler   Inhalation   Inhale 2 puffs into the lungs every 6 (six) hours as needed for wheezing.   1 Inhaler   11   . FLUoxetine (PROZAC) 20 MG capsule   Oral   Take 1 capsule (20 mg total) by mouth daily. For  depression   30 capsule   0   . hydrOXYzine (ATARAX/VISTARIL) 50 MG tablet   Oral   Take 1 tablet (50 mg total) by mouth at bedtime and may repeat dose one time if needed.   30 tablet   0   . LORazepam (ATIVAN) 0.5 MG tablet   Oral   Take 1 tablet (0.5 mg total) by mouth every 8 (eight) hours as needed for anxiety.   30 tablet   0   . lurasidone (LATUDA) 40 MG TABS   Oral   Take 1 tablet (40 mg total) by mouth daily after supper. For mood stability.   30 tablet   0   . ondansetron (ZOFRAN) 4 MG tablet   Oral   Take 1 tablet (4 mg total) by mouth every 6 (six) hours. Prn n/v   8 tablet   0   . OXcarbazepine (TRILEPTAL) 150 MG tablet   Oral   Take 1 tablet (150 mg total) by mouth 2 (two) times daily. For mood stability.   30 tablet   0   . traZODone (DESYREL) 50 MG tablet   Oral   Take 1 tablet (50 mg total) by mouth at bedtime as needed for sleep.   30 tablet  0    BP 122/84  Pulse 102  Temp(Src) 99.3 F (37.4 C) (Oral)  Resp 16  SpO2 100%  LMP 08/20/2013 Physical Exam  Nursing note and vitals reviewed. Constitutional: She is oriented to person, place, and time. She appears well-developed and well-nourished.  Pulmonary/Chest: Effort normal and breath sounds normal.  Abdominal: Soft. Bowel sounds are normal. She exhibits no distension and no mass. There is no tenderness. There is no rebound and no guarding.  Neurological: She is alert and oriented to person, place, and time.  Skin: Skin is warm and dry.    ED Course  Procedures (including critical care time) Labs Review Labs Reviewed - No data to display Imaging Review No results found.  MDM   1. Gastroenteritis, acute        Linna Hoff, MD 08/20/13 [phone]

## 2013-08-20 NOTE — Discharge Instructions (Signed)
Clear liquid  diet tonight as tolerated, advance on sun as improved, use medicine as needed, return or see your doctor if any problems.  °

## 2013-08-20 NOTE — ED Notes (Signed)
Pt  Reports  Symptoms  Of  Abdominal  Pain   With  Nausea   And  Vomited  X  1     Symptoms  Began   Today     Ambulated  To  Room  With a  Steady fluid  Gait         Sitting  Upright on  Exam table  Speaking in  Complete  sentances

## 2013-08-21 ENCOUNTER — Emergency Department (HOSPITAL_COMMUNITY)
Admission: EM | Admit: 2013-08-21 | Discharge: 2013-08-21 | Disposition: A | Discharge status: Home / Self Care | Payer: Medicare Other | Attending: Emergency Medicine | Admitting: Emergency Medicine

## 2013-08-21 ENCOUNTER — Encounter (HOSPITAL_COMMUNITY): Payer: Self-pay | Admitting: Emergency Medicine

## 2013-08-21 DIAGNOSIS — R1110 Vomiting, unspecified: Secondary | ICD-10-CM | POA: Insufficient documentation

## 2013-08-21 DIAGNOSIS — Z87440 Personal history of urinary (tract) infections: Secondary | ICD-10-CM | POA: Insufficient documentation

## 2013-08-21 DIAGNOSIS — F319 Bipolar disorder, unspecified: Secondary | ICD-10-CM | POA: Insufficient documentation

## 2013-08-21 DIAGNOSIS — Z8719 Personal history of other diseases of the digestive system: Secondary | ICD-10-CM | POA: Insufficient documentation

## 2013-08-21 DIAGNOSIS — F411 Generalized anxiety disorder: Secondary | ICD-10-CM | POA: Insufficient documentation

## 2013-08-21 DIAGNOSIS — R0789 Other chest pain: Secondary | ICD-10-CM | POA: Insufficient documentation

## 2013-08-21 DIAGNOSIS — Z79899 Other long term (current) drug therapy: Secondary | ICD-10-CM | POA: Insufficient documentation

## 2013-08-21 DIAGNOSIS — J45909 Unspecified asthma, uncomplicated: Secondary | ICD-10-CM | POA: Insufficient documentation

## 2013-08-21 DIAGNOSIS — F909 Attention-deficit hyperactivity disorder, unspecified type: Secondary | ICD-10-CM | POA: Insufficient documentation

## 2013-08-21 MED ORDER — GI COCKTAIL ~~LOC~~
30.0000 mL | Freq: Once | ORAL | Status: AC
  Administered 2013-08-21: 30 mL via ORAL
  Filled 2013-08-21: qty 30

## 2013-08-21 MED ORDER — ESOMEPRAZOLE MAGNESIUM 20 MG PO CPDR: 20.0000 mg | DELAYED_RELEASE_CAPSULE | Freq: Every day | ORAL | Status: DC

## 2013-08-21 NOTE — ED Notes (Signed)
Report chest pain that started yesterday after lunch after vomiting x1. Yesterday was a 6/10; today no N/V but chest pain still there. Now a 3/10 and hurts to palpation.

## 2013-08-21 NOTE — ED Provider Notes (Signed)
CSN: [id number]     Arrival date & time 08/21/13  1121 History   First MD Initiated Contact with Patient 08/21/13 1131     Chief Complaint  Patient presents with  . Chest Pain   (Consider location/radiation/quality/duration/timing/severity/associated sxs/prior Treatment) HPI Comments: Patient presents to the ER for evaluation of chest pain. Patient reports that the pain began after eating pizza yesterday. She reports a history of reflux and says that pizza usually upsets her reflux. She takes Protonix regularly. Patient reports that the pain is in the left upper chest and it is very tender to the touch. She denies any injury. She vomited once yesterday, but no further nausea or vomiting today. Patient denies any shortness of breath.  Patient is a 27 y.o. female presenting with chest pain.  Chest Pain Associated symptoms: no shortness of breath     Past Medical History  Diagnosis Date  . Bipolar disorder   . Attention deficit hyperactivity disorder   . Depression   . Anxiety   . Asthma   . UTI (urinary tract infection)     Completed Cipro 08/06/12  . Gastritis    Past Surgical History  Procedure Laterality Date  . Mouth surgery    . Eye muscle surgery Bilateral     07/29/12   Family History  Problem Relation Age of Onset  . Depression Mother   . Depression Father   . Depression Brother   . Colon cancer Mother   . Colon polyps Mother   . Liver disease    . Kidney disease     History  Substance Use Topics  . Smoking status: Never Smoker   . Smokeless tobacco: Never Used  . Alcohol Use: No   OB History   Grav Para Term Preterm Abortions TAB SAB Ect Mult Living   0 0             Review of Systems  Respiratory: Negative for shortness of breath.   Cardiovascular: Positive for chest pain.  All other systems reviewed and are negative.    Allergies  Depakote; Methylphenidate derivatives; and Prozac  Home Medications   Current Outpatient Rx  Name  Route  Sig   Dispense  Refill  . albuterol (PROVENTIL HFA;VENTOLIN HFA) 108 (90 BASE) MCG/ACT inhaler   Inhalation   Inhale 2 puffs into the lungs every 6 (six) hours as needed for wheezing.   1 Inhaler   11   . FLUoxetine (PROZAC) 20 MG capsule   Oral   Take 1 capsule (20 mg total) by mouth daily. For depression   30 capsule   0   . hydrOXYzine (ATARAX/VISTARIL) 50 MG tablet   Oral   Take 1 tablet (50 mg total) by mouth at bedtime and may repeat dose one time if needed.   30 tablet   0   . LORazepam (ATIVAN) 0.5 MG tablet   Oral   Take 1 tablet (0.5 mg total) by mouth every 8 (eight) hours as needed for anxiety.   30 tablet   0   . lurasidone (LATUDA) 40 MG TABS   Oral   Take 1 tablet (40 mg total) by mouth daily after supper. For mood stability.   30 tablet   0   . ondansetron (ZOFRAN) 4 MG tablet   Oral   Take 1 tablet (4 mg total) by mouth every 6 (six) hours. Prn n/v   8 tablet   0   . OXcarbazepine (TRILEPTAL) 150 MG tablet  Oral   Take 1 tablet (150 mg total) by mouth 2 (two) times daily. For mood stability.   30 tablet   0   . traZODone (DESYREL) 50 MG tablet   Oral   Take 1 tablet (50 mg total) by mouth at bedtime as needed for sleep.   30 tablet   0    BP 94/59  Pulse 80  Temp(Src) 98.4 F (36.9 C) (Oral)  Resp 16  Ht 5\' 6"  (1.676 m)  Wt 200 lb (90.719 kg)  BMI 32.3 kg/m2  SpO2 98%  LMP 08/20/2013 Physical Exam  Constitutional: She is oriented to person, place, and time. She appears well-developed and well-nourished. No distress.  HENT:  Head: Normocephalic and atraumatic.  Right Ear: Hearing normal.  Left Ear: Hearing normal.  Nose: Nose normal.  Mouth/Throat: Oropharynx is clear and moist and mucous membranes are normal.  Eyes: Conjunctivae and EOM are normal. Pupils are equal, round, and reactive to light.  Neck: Normal range of motion. Neck supple.  Cardiovascular: Regular rhythm, S1 normal and S2 normal.  Exam reveals no gallop and no friction  rub.   No murmur heard. Pulmonary/Chest: Effort normal and breath sounds normal. No respiratory distress. She exhibits no tenderness.    Abdominal: Soft. Normal appearance and bowel sounds are normal. There is no hepatosplenomegaly. There is no tenderness. There is no rebound, no guarding, no tenderness at McBurney's point and negative Murphy's sign. No hernia.  Musculoskeletal: Normal range of motion.  Neurological: She is alert and oriented to person, place, and time. She has normal strength. No cranial nerve deficit or sensory deficit. Coordination normal. GCS eye subscore is 4. GCS verbal subscore is 5. GCS motor subscore is 6.  Skin: Skin is warm, dry and intact. No rash noted. No cyanosis.  Psychiatric: She has a normal mood and affect. Her speech is normal and behavior is normal. Thought content normal.    ED Course  Procedures (including critical care time)  EKG:  Date: 08/21/2013  Rate: 70  Rhythm: normal sinus rhythm  QRS Axis: normal  Intervals: normal  ST/T Wave abnormalities: nonspecific T wave changes  Conduction Disutrbances:none  Narrative Interpretation:   Old EKG Reviewed: unchanged   Labs Review Labs Reviewed - No data to display Imaging Review No results found.  MDM  Diagnosis: Atypical Chest Pain  Patient presents with complaints of chest pain. Pain is extremely atypical. It is reproducible with palpation the left upper chest wall region. Patient is convinced that it is consistent with her reflux. It started after eating pizza which often causes her reflux to act up. Her EKG has nonspecific T-wave changes, poor R wave progression in anterior leads, but is consistent with previous EKGs. I do not have anything that makes me concerned for acute coronary syndrome at this point. Patient declines any blood work. I did give her a GI cocktail she had resolution of her pain.   Discharge, followup with her primary doctor.      Gilda Crease, MD 08/21/13  1210

## 2013-08-21 NOTE — Discharge Instructions (Signed)
Chest Pain (Nonspecific) °It is often hard to give a specific diagnosis for the cause of chest pain. There is always a chance that your pain could be related to something serious, such as a heart attack or a blood clot in the lungs. You need to follow up with your caregiver for further evaluation. °CAUSES  °· Heartburn. °· Pneumonia or bronchitis. °· Anxiety or stress. °· Inflammation around your heart (pericarditis) or lung (pleuritis or pleurisy). °· A blood clot in the lung. °· A collapsed lung (pneumothorax). It can develop suddenly on its own (spontaneous pneumothorax) or from injury (trauma) to the chest. °· Shingles infection (herpes zoster virus). °The chest wall is composed of bones, muscles, and cartilage. Any of these can be the source of the pain. °· The bones can be bruised by injury. °· The muscles or cartilage can be strained by coughing or overwork. °· The cartilage can be affected by inflammation and become sore (costochondritis). °DIAGNOSIS  °Lab tests or other studies, such as X-rays, electrocardiography, stress testing, or cardiac imaging, may be needed to find the cause of your pain.  °TREATMENT  °· Treatment depends on what may be causing your chest pain. Treatment may include: °· Acid blockers for heartburn. °· Anti-inflammatory medicine. °· Pain medicine for inflammatory conditions. °· Antibiotics if an infection is present. °· You may be advised to change lifestyle habits. This includes stopping smoking and avoiding alcohol, caffeine, and chocolate. °· You may be advised to keep your head raised (elevated) when sleeping. This reduces the chance of acid going backward from your stomach into your esophagus. °· Most of the time, nonspecific chest pain will improve within 2 to 3 days with rest and mild pain medicine. °HOME CARE INSTRUCTIONS  °· If antibiotics were prescribed, take your antibiotics as directed. Finish them even if you start to feel better. °· For the next few days, avoid physical  activities that bring on chest pain. Continue physical activities as directed. °· Do not smoke. °· Avoid drinking alcohol. °· Only take over-the-counter or prescription medicine for pain, discomfort, or fever as directed by your caregiver. °· Follow your caregiver's suggestions for further testing if your chest pain does not go away. °· Keep any follow-up appointments you made. If you do not go to an appointment, you could develop lasting (chronic) problems with pain. If there is any problem keeping an appointment, you must call to reschedule. °SEEK MEDICAL CARE IF:  °· You think you are having problems from the medicine you are taking. Read your medicine instructions carefully. °· Your chest pain does not go away, even after treatment. °· You develop a rash with blisters on your chest. °SEEK IMMEDIATE MEDICAL CARE IF:  °· You have increased chest pain or pain that spreads to your arm, neck, jaw, back, or abdomen. °· You develop shortness of breath, an increasing cough, or you are coughing up blood. °· You have severe back or abdominal pain, feel nauseous, or vomit. °· You develop severe weakness, fainting, or chills. °· You have a fever. °THIS IS AN EMERGENCY. Do not wait to see if the pain will go away. Get medical help at once. Call your local emergency services (911 in [place] Do not drive yourself to the hospital. °MAKE SURE YOU:  °· Understand these instructions. °· Will watch your condition. °· Will get help right away if you are not doing well or get worse. °Document Released: 09/10/2005 Document Revised: 02/23/2012 Document Reviewed: 07/06/2008 °Exit[employer]® Patient Information ©2014 [employer]   [employer]. ° °

## 2013-08-22 ENCOUNTER — Ambulatory Visit (HOSPITAL_BASED_OUTPATIENT_CLINIC_OR_DEPARTMENT_OTHER): Payer: Medicare Other | Attending: Internal Medicine | Admitting: Radiology

## 2013-08-22 VITALS — Ht 65.0 in | Wt 240.0 lb

## 2013-08-22 DIAGNOSIS — R0602 Shortness of breath: Secondary | ICD-10-CM

## 2013-08-22 DIAGNOSIS — G4733 Obstructive sleep apnea (adult) (pediatric): Secondary | ICD-10-CM | POA: Insufficient documentation

## 2013-08-26 DIAGNOSIS — G4733 Obstructive sleep apnea (adult) (pediatric): Secondary | ICD-10-CM

## 2013-08-27 NOTE — Procedures (Signed)
NAME:  [name], [name]            ACCOUNT NO.:  [id number]  MEDICAL RECORD NO.:  [id number]          PATIENT TYPE:  OUT  LOCATION:  SLEEP CENTER                 FACILITY:  [facility]  PHYSICIAN:  Clinton D. Maple Hudson, MD, FCCP, FACPDATE OF BIRTH:  1987-06-02  DATE OF STUDY:  08/22/2013                           NOCTURNAL POLYSOMNOGRAM  REFERRING PHYSICIAN:  SAMAYA QURESHI  INDICATION FOR STUDY:  Hypersomnia with sleep apnea.  EPWORTH SLEEPINESS SCORE:  8/24.  BMI 39.9, weight 240 pounds, height 65 inches, neck 15 inches.  MEDICATIONS:  Home medications are charted for review.  SLEEP ARCHITECTURE:  Total sleep time 379 minutes with sleep efficiency 93.1%.  Stage I was 0.8%, stage II 19.8%, stage III 58.6%, REM 20.8% of total sleep time.  Sleep latency 21.5 minutes, REM latency 189 minutes. Awake after sleep onset 6.5 minutes.  Arousal index 8.2.  BEDTIME MEDICATION:  Trazodone, Abilify.  RESPIRATORY DATA:  Apnea-hypopnea index (AHI) 5.4 per hour.  A total of 34 events was scored including 20 obstructive apneas, 6 central apneas, 1 mixed apnea, 7 hypopneas.  All events were associated with supine sleep position and with REM.  REM AHI 9.1 per hour.  This is a diagnostic NPSG protocol without CPAP.  OXYGEN DATA:  Mild-to-moderate snoring with oxygen desaturation to a nadir of 86% and mean oxygen saturation through the study of 94.7% on room air.  CARDIAC DATA:  Sinus rhythm with occasional PAC.  MOVEMENT-PARASOMNIA:  A total of 55 limb jerks were counted, of which 12 were associated with arousal or awakening for periodic limb movements with arousal index of 1.9 per hour.  No bathroom trips.  IMPRESSIONS-RECOMMENDATIONS: 1. Minimal obstructive sleep apnea/hypopnea syndrome, AHI 5.4 per hour     with supine events.  REM AHI 9.1 per hour.  The normal AHI for     adults is 0-5 events per hour.  Mild-to-moderate snoring with     oxygen desaturation to a nadir of 86% and mean oxygen  saturation     through the study of 94.7% on room air. 2. 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  The total of     55 limb jerks were counted, of which 12 were associated with     arousals or awakenings for periodic limb movement with arousal     index of 1.9 per hour.     Clinton D. Maple Hudson, MD, [facility], FACP Diplomate, American Board of Sleep Medicine    CDY/MEDQ  D:  08/26/2013 20:13:51  T:  08/27/2013 05:49:30  Job:  [id number]

## 2013-09-09 ENCOUNTER — Encounter (HOSPITAL_COMMUNITY): Payer: Self-pay | Admitting: Emergency Medicine

## 2013-09-09 ENCOUNTER — Emergency Department (HOSPITAL_COMMUNITY)
Admission: EM | Admit: 2013-09-09 | Discharge: 2013-09-09 | Disposition: A | Discharge status: Home / Self Care | Payer: Medicare Other | Attending: Emergency Medicine | Admitting: Emergency Medicine

## 2013-09-09 DIAGNOSIS — N898 Other specified noninflammatory disorders of vagina: Secondary | ICD-10-CM | POA: Insufficient documentation

## 2013-09-09 DIAGNOSIS — K921 Melena: Secondary | ICD-10-CM | POA: Insufficient documentation

## 2013-09-09 DIAGNOSIS — J45909 Unspecified asthma, uncomplicated: Secondary | ICD-10-CM | POA: Insufficient documentation

## 2013-09-09 DIAGNOSIS — Z8719 Personal history of other diseases of the digestive system: Secondary | ICD-10-CM | POA: Insufficient documentation

## 2013-09-09 DIAGNOSIS — Z87440 Personal history of urinary (tract) infections: Secondary | ICD-10-CM | POA: Insufficient documentation

## 2013-09-09 DIAGNOSIS — Z79899 Other long term (current) drug therapy: Secondary | ICD-10-CM | POA: Insufficient documentation

## 2013-09-09 DIAGNOSIS — R0789 Other chest pain: Secondary | ICD-10-CM | POA: Insufficient documentation

## 2013-09-09 DIAGNOSIS — F411 Generalized anxiety disorder: Secondary | ICD-10-CM | POA: Insufficient documentation

## 2013-09-09 DIAGNOSIS — F319 Bipolar disorder, unspecified: Secondary | ICD-10-CM | POA: Insufficient documentation

## 2013-09-09 LAB — OCCULT BLOOD, POC DEVICE: Fecal Occult Bld: NEGATIVE

## 2013-09-09 MED ORDER — IBUPROFEN 600 MG PO TABS: 600.0000 mg | ORAL_TABLET | Freq: Four times a day (QID) | ORAL | Status: DC | PRN

## 2013-09-09 MED ORDER — OMEPRAZOLE 20 MG PO CPDR: 20.0000 mg | DELAYED_RELEASE_CAPSULE | Freq: Every day | ORAL | Status: DC

## 2013-09-09 NOTE — ED Provider Notes (Signed)
CSN: [id number]     Arrival date & time 09/09/13  1057 History   First MD Initiated Contact with Patient 09/09/13 1113     Chief Complaint  Patient presents with  . Chest Pain  . Rectal Bleeding   (Consider location/radiation/quality/duration/timing/severity/associated sxs/prior Treatment) HPI Comments: Patient is a 27 year old female with a history of esophageal reflux who presents for chest pain with onset this morning lasting one hour before resolving spontaneously. Patient states that symptom onset was during a nap after patient ate a bowl of spaghetti. Patient states pain was sharp in nature and nonradiating. She states she is chest pain free at this time and has been since arrival. Patient denies taking anything for her symptoms as well as any alleviating factors. Patient also endorsing a spot of blood in the toilet while having a bowel movement. Patient states she noticed, after, that she was beginning her menstrual cycle. Patient endorses irregular menses and LMP 08/20/2013. She denies N/V/D, melena, hematochezia, and fever as well as SOB and syncope. Patient endorses noncompliance with her GERD medication.  Patient is a 27 y.o. female presenting with chest pain and hematochezia. The history is provided by the patient. No language interpreter was used.  Chest Pain Associated symptoms: no fever   Rectal Bleeding Associated symptoms: no fever     Past Medical History  Diagnosis Date  . Bipolar disorder   . Attention deficit hyperactivity disorder   . Depression   . Anxiety   . Asthma   . UTI (urinary tract infection)     Completed Cipro 08/06/12  . Gastritis    Past Surgical History  Procedure Laterality Date  . Mouth surgery    . Eye muscle surgery Bilateral     07/29/12   Family History  Problem Relation Age of Onset  . Depression Mother   . Depression Father   . Depression Brother   . Colon cancer Mother   . Colon polyps Mother   . Liver disease    . Kidney disease      History  Substance Use Topics  . Smoking status: Never Smoker   . Smokeless tobacco: Never Used  . Alcohol Use: No   OB History   Grav Para Term Preterm Abortions TAB SAB Ect Mult Living   0 0             Review of Systems  Constitutional: Negative for fever.  Cardiovascular: Positive for chest pain.  Gastrointestinal: Positive for hematochezia.  Genitourinary: Positive for vaginal bleeding.  All other systems reviewed and are negative.    Allergies  Depakote; Methylphenidate derivatives; and Prozac  Home Medications   Current Outpatient Rx  Name  Route  Sig  Dispense  Refill  . albuterol (PROVENTIL HFA;VENTOLIN HFA) 108 (90 BASE) MCG/ACT inhaler   Inhalation   Inhale 2 puffs into the lungs every 6 (six) hours as needed for wheezing.   1 Inhaler   11   . ARIPiprazole (ABILIFY) 30 MG tablet   Oral   Take 30 mg by mouth at bedtime.         . carbamazepine (TEGRETOL) 200 MG tablet   Oral   Take 400 mg by mouth 2 (two) times daily.         . citalopram (CELEXA) 20 MG tablet   Oral   Take 20 mg by mouth daily.         [name] LORazepam (ATIVAN) 0.5 MG tablet   Oral   Take 1 tablet (  0.5 mg total) by mouth every 8 (eight) hours as needed for anxiety.   30 tablet   0   . trazodone (DESYREL) 300 MG tablet   Oral   Take 300 mg by mouth at bedtime.         [name] ibuprofen (ADVIL,MOTRIN) 600 MG tablet   Oral   Take 1 tablet (600 mg total) by mouth every 6 (six) hours as needed for pain.   30 tablet   0   . omeprazole (PRILOSEC) 20 MG capsule   Oral   Take 1 capsule (20 mg total) by mouth daily.   30 capsule   0    BP 110/62  Pulse 72  Temp(Src) 98.7 F (37.1 C) (Oral)  Resp 16  SpO2 95%  LMP 08/20/2013  Physical Exam  Nursing note and vitals reviewed. Constitutional: She is oriented to person, place, and time. She appears well-developed and well-nourished. No distress.  HENT:  Head: Normocephalic and atraumatic.  Eyes: Conjunctivae and EOM are  normal. No scleral icterus.  Neck: Normal range of motion.  Cardiovascular: Normal rate, regular rhythm, normal heart sounds and intact distal pulses.   Pulmonary/Chest: Effort normal. No respiratory distress. She has no wheezes. She has no rales.  Abdominal: Soft. She exhibits no distension. There is no tenderness. There is no rebound and no guarding.  No peritoneal signs or areas of focal tenderness  Genitourinary: Rectum normal. Rectal exam shows no external hemorrhoid, no internal hemorrhoid and no fissure. Guaiac negative stool.  Musculoskeletal: Normal range of motion.  Neurological: She is alert and oriented to person, place, and time.  Skin: Skin is warm and dry. No rash noted. She is not diaphoretic. No erythema. No pallor.  Psychiatric: She has a normal mood and affect. Her behavior is normal.    ED Course  Procedures (including critical care time) Labs Review Labs Reviewed  OCCULT BLOOD, POC DEVICE   Imaging Review No results found.   Date: 09/09/2013  Rate: 67  Rhythm: normal sinus rhythm  QRS Axis: normal  Intervals: normal  ST/T Wave abnormalities: nonspecific T wave changes  Conduction Disutrbances:none  Narrative Interpretation: NSR with NS T wave changes  Old EKG Reviewed: unchanged from 08/21/2013 I have personally reviewed and interpreted this EKG.  MDM   1. Atypical chest pain    27 year old with history of esophageal reflux presents for atypical chest pain. Chest pain spontaneously resolved and patient has been chest pain-free for duration of ED stay. Patient in no visible are audible discomfort. She is using her self on and is easily preoccupied. Requesting a sandwich and Coke. Physical exam unremarkable and patient hemodynamically stable and afebrile. EKG unchanged from prior and Hemoccult negative. Blood in the toilet likely from onset of menstrual cycle today. Suspect that chest pain is secondary to esophageal reflux and symptoms began after patient at both  passive and then took a nap; she also endorses noncompliance with her esophageal reflux medications. Patient appropriate for discharge with primary care followup for further evaluation of symptoms. Return precautions discussed and patient agreeable to plan with no unaddressed concerns. Prescription for omeprazole given; ibuprofen recommended for menstrual cramps.    Antony Madura, PA-C 09/09/13 1654

## 2013-09-09 NOTE — Discharge Instructions (Signed)
Take omeprazole as prescribed for symptoms. Also recommend ibuprofen for menstrual cramps. Followup with her primary care provider regarding symptoms. Return if symptoms worsen.  Gastroesophageal Reflux Disease, Adult Gastroesophageal reflux disease (GERD) happens when acid from your stomach flows up into the esophagus. When acid comes in contact with the esophagus, the acid causes soreness (inflammation) in the esophagus. Over time, GERD may create small holes (ulcers) in the lining of the esophagus. CAUSES   Increased body weight. This puts pressure on the stomach, making acid rise from the stomach into the esophagus.  Smoking. This increases acid production in the stomach.  Drinking alcohol. This causes decreased pressure in the lower esophageal sphincter (valve or ring of muscle between the esophagus and stomach), allowing acid from the stomach into the esophagus.  Late evening meals and a full stomach. This increases pressure and acid production in the stomach.  A malformed lower esophageal sphincter. Sometimes, no cause is found. SYMPTOMS   Burning pain in the lower part of the mid-chest behind the breastbone and in the mid-stomach area. This may occur twice a week or more often.  Trouble swallowing.  Sore throat.  Dry cough.  Asthma-like symptoms including chest tightness, shortness of breath, or wheezing. DIAGNOSIS  Your caregiver may be able to diagnose GERD based on your symptoms. In some cases, X-rays and other tests may be done to check for complications or to check the condition of your stomach and esophagus. TREATMENT  Your caregiver may recommend over-the-counter or prescription medicines to help decrease acid production. Ask your caregiver before starting or adding any new medicines.  HOME CARE INSTRUCTIONS   Change the factors that you can control. Ask your caregiver for guidance concerning weight loss, quitting smoking, and alcohol consumption.  Avoid foods and  drinks that make your symptoms worse, such as:  Caffeine or alcoholic drinks.  Chocolate.  Peppermint or mint flavorings.  Garlic and onions.  Spicy foods.  Citrus fruits, such as oranges, lemons, or limes.  Tomato-based foods such as sauce, chili, salsa, and pizza.  Fried and fatty foods.  Avoid lying down for the 3 hours prior to your bedtime or prior to taking a nap.  Eat small, frequent meals instead of large meals.  Wear loose-fitting clothing. Do not wear anything tight around your waist that causes pressure on your stomach.  Raise the head of your bed 6 to 8 inches with wood blocks to help you sleep. Extra pillows will not help.  Only take over-the-counter or prescription medicines for pain, discomfort, or fever as directed by your caregiver.  Do not take aspirin, ibuprofen, or other nonsteroidal anti-inflammatory drugs (NSAIDs). SEEK IMMEDIATE MEDICAL CARE IF:   You have pain in your arms, neck, jaw, teeth, or back.  Your pain increases or changes in intensity or duration.  You develop nausea, vomiting, or sweating (diaphoresis).  You develop shortness of breath, or you faint.  Your vomit is green, yellow, black, or looks like coffee grounds or blood.  Your stool is red, bloody, or black. These symptoms could be signs of other problems, such as heart disease, gastric bleeding, or esophageal bleeding. MAKE SURE YOU:   Understand these instructions.  Will watch your condition.  Will get help right away if you are not doing well or get worse. Document Released: 09/10/2005 Document Revised: 02/23/2012 Document Reviewed: 06/20/2011 [facility] Patient Information 2014 [place], [place].

## 2013-09-09 NOTE — ED Notes (Signed)
Pt reports that she started having CP around 5am today. Pt has hx of CP but was told that she has "heartburn". Pt adds that she had "quarter size" blood in toilet after having a BM this am also. Pt is A&O and in NAD. Pt states that her CP is 8.5/10, but has never taken her eyes of cell phone to answer questions.

## 2013-09-10 NOTE — ED Provider Notes (Signed)
Medical screening examination/treatment/procedure(s) were performed by non-physician practitioner and as supervising physician I was immediately available for consultation/collaboration.  Hurman Horn, MD 09/10/13 [id number]

## 2013-09-15 ENCOUNTER — Encounter: Payer: Self-pay | Admitting: Internal Medicine

## 2013-09-15 DIAGNOSIS — G4733 Obstructive sleep apnea (adult) (pediatric): Secondary | ICD-10-CM | POA: Insufficient documentation

## 2013-09-19 ENCOUNTER — Ambulatory Visit: Payer: Self-pay | Admitting: Internal Medicine

## 2013-09-21 ENCOUNTER — Ambulatory Visit: Payer: Medicare Other | Admitting: Internal Medicine

## 2013-09-21 ENCOUNTER — Encounter: Payer: Self-pay | Admitting: Internal Medicine

## 2013-09-21 VITALS — BP 100/70 | HR 76 | Temp 98.7°F | Ht 65.0 in | Wt 245.4 lb

## 2013-09-21 DIAGNOSIS — A084 Viral intestinal infection, unspecified: Secondary | ICD-10-CM

## 2013-09-21 NOTE — Patient Instructions (Addendum)
Please come back to clinic if you have any of the following symptoms: You cannot keep fluids down. You do not pee at least once every 6 to 8 hours. You are short of breath. You see blood in your poop or throw up. This may look like coffee grounds. You have belly (abdominal) pain that gets worse or is just in one small spot (localized). You keep throwing up or having watery poop. You have a fever. Bloody diarrhea Feeling dizzy of light headed   Please take plenty of fluids. Avoid flavored foods/drinks  Water is the best.  You may take ibuprofen for muscle pains. Follow up as needed.     Viral Gastroenteritis Viral gastroenteritis is also called stomach flu. This illness is caused by a certain type of germ (virus). It can cause sudden watery poop (diarrhea) and throwing up (vomiting). This can cause you to lose body fluids (dehydration). This illness usually lasts for 3 to 8 days. It usually goes away on its own. HOME CARE   Drink enough fluids to keep your pee (urine) clear or pale yellow. Drink small amounts of fluids often.  Ask your doctor how to replace body fluid losses (rehydration).  Avoid:  Foods high in sugar.  Alcohol.  Bubbly (carbonated) drinks.  Tobacco.  Juice.  Caffeine drinks.  Very hot or cold fluids.  Fatty, greasy foods.  Eating too much at one time.  Dairy products until 24 to 48 hours after your watery poop stops.  You may eat foods with active cultures (probiotics). They can be found in some yogurts and supplements.  Wash your hands well to avoid spreading the illness.  Only take medicines as told by your doctor. Do not give aspirin to children. Do not take medicines for watery poop (antidiarrheals).  Ask your doctor if you should keep taking your regular medicines.  Keep all doctor visits as told. GET HELP RIGHT AWAY IF:   You cannot keep fluids down.  You do not pee at least once every 6 to 8 hours.  You are short of  breath.  You see blood in your poop or throw up. This may look like coffee grounds.  You have belly (abdominal) pain that gets worse or is just in one small spot (localized).  You keep throwing up or having watery poop.  You have a fever.  The patient is a child younger than 3 months, and he or she has a fever.  The patient is a child older than 3 months, and he or she has a fever and problems that do not go away.  The patient is a child older than 3 months, and he or she has a fever and problems that suddenly get worse.  The patient is a baby, and he or she has no tears when crying. MAKE SURE YOU:   Understand these instructions.  Will watch your condition.  Will get help right away if you are not doing well or get worse. Document Released: 05/19/2008 Document Revised: 02/23/2012 Document Reviewed: 09/17/2011 [facility] Patient Information 2014 [place], [place].

## 2013-09-22 DIAGNOSIS — A084 Viral intestinal infection, unspecified: Secondary | ICD-10-CM | POA: Insufficient documentation

## 2013-09-22 NOTE — Progress Notes (Signed)
Patient ID: [name], female   DOB: [birth year], 26 y.o.   MRN: [id number]   Subjective:   HPI: [name] is a 27 y.o. woman with past medical history of bipolar disorder, borderline disorder, GERD, substance abuse, obesity, and obstructive sleep apnea among myriad of other medical problems presents for an acute visit due to lower abdominal pain, diarrhea, nausea and vomiting.  Symptoms have been present for 4 days. Diarrhea is alternating between loose and watery stools; non bloody; without offensive smell. 3-4 diarrhea motions daily. She vomits once daily;  no hematemesis; most clear emesis. Lower abdominal pain is dull and sharp, constant, mild 4/10, nonradiating, no aggravating or relieving factors. No change in appetite. No urinary symptoms. No  fevers or chills. However, she reports some myalgias, and arthralgias occurring with onset of the above symptoms. She reports, that she sometimes feels dizzy but no recent falls. No history of contact with a patient with similar symptoms. No other family members with similar symptoms. She does not frequently gets these symptoms.   Past Medical History  Diagnosis Date  . Bipolar disorder   . Attention deficit hyperactivity disorder   . Depression   . Anxiety   . Asthma   . UTI (urinary tract infection)     Completed Cipro 08/06/12  . Gastritis    Current Outpatient Prescriptions  Medication Sig Dispense Refill  . albuterol (PROVENTIL HFA;VENTOLIN HFA) 108 (90 BASE) MCG/ACT inhaler Inhale 2 puffs into the lungs every 6 (six) hours as needed for wheezing.  1 Inhaler  11  . ARIPiprazole (ABILIFY) 30 MG tablet Take 30 mg by mouth at bedtime.      . carbamazepine (TEGRETOL) 200 MG tablet Take 400 mg by mouth 2 (two) times daily.      . citalopram (CELEXA) 20 MG tablet Take 20 mg by mouth daily.      [name] ibuprofen (ADVIL,MOTRIN) 600 MG tablet Take 1 tablet (600 mg total) by mouth every 6 (six) hours as needed for pain.  30 tablet  0    . LORazepam (ATIVAN) 0.5 MG tablet Take 1 tablet (0.5 mg total) by mouth every 8 (eight) hours as needed for anxiety.  30 tablet  0  . omeprazole (PRILOSEC) 20 MG capsule Take 1 capsule (20 mg total) by mouth daily.  30 capsule  0  . trazodone (DESYREL) 300 MG tablet Take 300 mg by mouth at bedtime.       No current facility-administered medications for this visit.   Family History  Problem Relation Age of Onset  . Depression Mother   . Depression Father   . Depression Brother   . Colon cancer Mother   . Colon polyps Mother   . Liver disease    . Kidney disease     History   Social History  . Marital Status: Single    Spouse Name: N/A    Number of Children: 0  . Years of Education: N/A   Social History Main Topics  . Smoking status: Never Smoker   . Smokeless tobacco: Never Used  . Alcohol Use: No  . Drug Use: No  . Sexual Activity: None   Other Topics Concern  . None   Social History Narrative  . None   Review of Systems: Constitutional: Denies fever, chills, diaphoresis, appetite change and fatigue.  Respiratory: Denies SOB, DOE, cough, chest tightness, and wheezing. Denies chest pain. Cardiovascular: No chest pain, palpitations and leg swelling.  Musculoskeletal: No myalgias, back pain,  joint swelling, arthralgias  Psych: No depression symptoms. No SI or SA.   Objective:  Physical Exam: Filed Vitals:   09/21/13 1428  BP: 100/70  Pulse: 76  Temp: 98.7 F (37.1 C)  TempSrc: Oral  Height: 5\' 5"  (1.651 m)  Weight: 245 lb 6.4 oz (111.313 kg)  SpO2: 99%   General: obesity, no acute distress. Mom in exam room  HEENT: Normal oral mucosa. Moist mucus membranes.  Lungs: CTA bilaterally. Heart: RRR; no extra sounds or murmurs  Abdomen: mild bilateral lower quadrant tenderness. No rebound tenderness, or guarding. normal BS, soft, abdomen, no hepatosplenomegaly  Extremities: No pedal edema. No joint swelling or tenderness. Neurologic: Normal EOM,  Alert and  oriented x3. No obvious neurologic/cranial nerve deficits.  Assessment & Plan:  I have discussed my assessment and plan  with  my attending in the clinic, Dr. Josem Kaufmann  as detailed under problem based charting.

## 2013-09-22 NOTE — Assessment & Plan Note (Addendum)
Clinical history and physical examination findings are consistent with a possibility of viral gastroenteritis or possible food poisoning. Patient declines sample for fecal exam. She does not appear sick looking, and vital signs are stable. Without urinary symptoms, I don't suspect a UTI. STI is also unlikely without vaginal discharge/complaints. No recent sexual contact per patient.  Plan. -Symptomatic management with increased fluid intake. -She decline Zofran for nausea - use motrin for muscle and joint pains for now -If symptoms persist I encouraged her to call clinic for reevaluation. -If bloody stools - will have to do fecal examination.

## 2013-09-26 NOTE — Progress Notes (Signed)
Case discussed with Dr. Kazibwe at the time of the visit.  We reviewed the resident's history and exam and pertinent patient test results.  I agree with the assessment, diagnosis and plan of care documented in the resident's note. 

## 2013-09-30 ENCOUNTER — Other Ambulatory Visit (HOSPITAL_COMMUNITY)
Admission: RE | Admit: 2013-09-30 | Discharge: 2013-09-30 | Disposition: A | Discharge status: Home / Self Care | Payer: Medicare Other | Source: Ambulatory Visit | Attending: Family Medicine | Admitting: Family Medicine

## 2013-09-30 ENCOUNTER — Emergency Department (INDEPENDENT_AMBULATORY_CARE_PROVIDER_SITE_OTHER)
Admission: EM | Admit: 2013-09-30 | Discharge: 2013-09-30 | Disposition: A | Discharge status: Home / Self Care | Payer: Medicare Other | Source: Home / Self Care | Attending: Family Medicine | Admitting: Family Medicine

## 2013-09-30 ENCOUNTER — Encounter (HOSPITAL_COMMUNITY): Payer: Self-pay | Admitting: Emergency Medicine

## 2013-09-30 DIAGNOSIS — Z113 Encounter for screening for infections with a predominantly sexual mode of transmission: Secondary | ICD-10-CM | POA: Insufficient documentation

## 2013-09-30 DIAGNOSIS — N760 Acute vaginitis: Secondary | ICD-10-CM | POA: Insufficient documentation

## 2013-09-30 LAB — POCT URINALYSIS DIP (DEVICE)
Glucose, UA: NEGATIVE mg/dL
Hgb urine dipstick: NEGATIVE
Leukocytes, UA: NEGATIVE
Nitrite: NEGATIVE
Protein, ur: NEGATIVE mg/dL
Specific Gravity, Urine: >=1.03 (ref 1.005–1.030)
Urobilinogen, UA: 1 mg/dL (ref 0.0–1.0)
pH: 6.5 (ref 5.0–8.0)

## 2013-09-30 LAB — POCT PREGNANCY, URINE: Preg Test, Ur: NEGATIVE

## 2013-09-30 MED ORDER — FLUCONAZOLE 150 MG PO TABS: 150.0000 mg | ORAL_TABLET | Freq: Once | ORAL | Status: DC

## 2013-09-30 NOTE — ED Notes (Addendum)
States she wants to be checked for an STD and UTI.  When she urinates it burns onset last week. States she is going frequently in small amounts. C/o thin white vaginal discharge x 2 days. Pt. refusing the urine test and asked for her papers.  I went and got the AMA form.  Pt. called her boyfriend and he talked her into being checked.  Pt. sent to [facility].

## 2013-09-30 NOTE — ED Provider Notes (Signed)
CSN: [id number]     Arrival date & time 09/30/13  1855 History   First MD Initiated Contact with Patient 09/30/13 2014     Chief Complaint  Patient presents with  . Urinary Tract Infection   (Consider location/radiation/quality/duration/timing/severity/associated sxs/prior Treatment) Patient is a 27 y.o. female presenting with urinary tract infection.  Urinary Tract Infection    Patient presents with dysuria and vaginal discharge. The dysuria started 1 week ago. It is not associated with abdominal pain, nausea or vomiting. Also, she denies back pain and fever.   Patient with vaginal discharge. She does not clearly state when it started. She also does not remember when she last had intercourse but states "a while ago". No pelvic pain. No itching. No missed period.    Past Medical History  Diagnosis Date  . Bipolar disorder   . Attention deficit hyperactivity disorder   . Depression   . Anxiety   . Asthma   . UTI (urinary tract infection)     Completed Cipro 08/06/12  . Gastritis    Past Surgical History  Procedure Laterality Date  . Mouth surgery    . Eye muscle surgery Bilateral     07/29/12   Family History  Problem Relation Age of Onset  . Depression Mother   . Depression Father   . Depression Brother   . Colon cancer Mother   . Colon polyps Mother   . Liver disease    . Kidney disease     History  Substance Use Topics  . Smoking status: Never Smoker   . Smokeless tobacco: Never Used  . Alcohol Use: No   OB History   Grav Para Term Preterm Abortions TAB SAB Ect Mult Living   0 0             Review of Systems  Constitutional: Negative for fever and chills.  Genitourinary: Positive for dysuria and frequency. Negative for flank pain, vaginal discharge, vaginal pain and pelvic pain.  Psychiatric/Behavioral: Negative.     Allergies  Depakote; Methylphenidate derivatives; and Prozac  Home Medications   Current Outpatient Rx  Name  Route  Sig  Dispense   Refill  . albuterol (PROVENTIL HFA;VENTOLIN HFA) 108 (90 BASE) MCG/ACT inhaler   Inhalation   Inhale 2 puffs into the lungs every 6 (six) hours as needed for wheezing.   1 Inhaler   11   . ARIPiprazole (ABILIFY) 30 MG tablet   Oral   Take 30 mg by mouth at bedtime.         . carbamazepine (TEGRETOL) 200 MG tablet   Oral   Take 400 mg by mouth 2 (two) times daily.         . citalopram (CELEXA) 20 MG tablet   Oral   Take 20 mg by mouth daily.         [name] LORazepam (ATIVAN) 0.5 MG tablet   Oral   Take 1 tablet (0.5 mg total) by mouth every 8 (eight) hours as needed for anxiety.   30 tablet   0   . trazodone (DESYREL) 300 MG tablet   Oral   Take 300 mg by mouth at bedtime.         . fluconazole (DIFLUCAN) 150 MG tablet   Oral   Take 1 tablet (150 mg total) by mouth once.   1 tablet   0   . ibuprofen (ADVIL,MOTRIN) 600 MG tablet   Oral   Take 1 tablet (600 mg total) by  mouth every 6 (six) hours as needed for pain.   30 tablet   0   . omeprazole (PRILOSEC) 20 MG capsule   Oral   Take 1 capsule (20 mg total) by mouth daily.   30 capsule   0    BP 126/76  Pulse 63  Temp(Src) 98.4 F (36.9 C) (Oral)  Resp 18  SpO2 100% Physical Exam  Constitutional: She is oriented to person, place, and time. She appears well-developed and well-nourished. No distress.  Obese  Abdominal: Soft. Bowel sounds are normal.  Genitourinary: There is no rash, tenderness or lesion on the right labia. There is no rash, tenderness or lesion on the left labia. Cervix exhibits no discharge. No tenderness or bleeding around the vagina. Vaginal discharge found.  Neurological: She is alert and oriented to person, place, and time.  Skin: Skin is warm and dry. She is not diaphoretic.  Psychiatric:  Strange affect and very confused by basic explanation of testing process for urinary tract infections and STI's, concern for possible intellectual deficit    ED Course  Procedures (including  critical care time) Labs Review Labs Reviewed  POCT URINALYSIS DIP (DEVICE) - Abnormal; Notable for the following:    Bilirubin Urine SMALL (*)    Ketones, ur TRACE (*)    All other components within normal limits  POCT PREGNANCY, URINE  CERVICOVAGINAL ANCILLARY ONLY   Imaging Review No results found.    MDM   1. Vaginitis     No evidence of UTI on urinalysis, so won't culture urine. Will treat for vaginal candidiasis presumptively and follow up for BV, trichomoniasis, gonorrhea and chlamydia.     Garnetta Buddy, MD 09/30/13 2154

## 2013-09-30 NOTE — Discharge Instructions (Signed)
Please take the one tablet called diflucan that I sent to the pharmacy. It will treat any yeast infection. We will let you know if the results from the other test come back and need treatment. If the tests are normal, then you will not hear from [place].   Take Care,   Dr. Clinton Sawyer

## 2013-10-01 NOTE — ED Provider Notes (Signed)
Medical screening examination/treatment/procedure(s) were performed by a resident physician or non-physician practitioner and as the supervising physician I was immediately available for consultation/collaboration.  Keonia Pasko, MD    Torrie Namba S Toye Rouillard, MD 10/01/13 0841 

## 2013-10-04 NOTE — ED Notes (Signed)
GC/Chlamydia neg., Affirm: Candida pos., Gardnerella and Trich neg.  Pt. adequately treated with Diflucan. Somaly Marteney M 10/04/2013  

## 2013-10-20 ENCOUNTER — Telehealth: Payer: Self-pay | Admitting: *Deleted

## 2013-10-20 ENCOUNTER — Encounter: Payer: Self-pay | Admitting: Internal Medicine

## 2013-10-20 ENCOUNTER — Ambulatory Visit (INDEPENDENT_AMBULATORY_CARE_PROVIDER_SITE_OTHER): Payer: Medicare Other | Admitting: Internal Medicine

## 2013-10-20 VITALS — BP 124/84 | HR 78 | Temp 98.6°F | Wt 243.4 lb

## 2013-10-20 DIAGNOSIS — A084 Viral intestinal infection, unspecified: Secondary | ICD-10-CM

## 2013-10-20 DIAGNOSIS — A088 Other specified intestinal infections: Secondary | ICD-10-CM

## 2013-10-20 NOTE — Patient Instructions (Signed)
Thank you for your visit. If your symptoms do not improve within the next week please call our clinic and schedule followup appointment.  If your diarrhea is not resolved by Monday please provide [place] with a stool sample. We gave you samples cups to go home with.  Please increase your fluid intake. Gatorade and water are good options. I suspect your symptoms will resolve on their own within the next week.  You requested a flu shot today. Since you are not feeling well today, we will postpone this. Please call our clinic and schedule a nurse appointment to receive your flu shot when you're feeling better.

## 2013-10-20 NOTE — Progress Notes (Signed)
I saw and evaluated the patient.  I personally confirmed the key portions of the history and exam documented by Dr. Chikowsky and I reviewed pertinent patient test results.  The assessment, diagnosis, and plan were formulated together and I agree with the documentation in the resident's note. 

## 2013-10-20 NOTE — Assessment & Plan Note (Signed)
Patient presents with another episode of nausea, vomiting, diarrhea today. She also has some complaints of URI symptoms. I believe this is another episode of viral gastroenteritis, which will likely resolve on its own within the next week. I do not suspect bacterial infection or invasive diarrhea given she is not describing any blood or mucous in her stool and she has no fever or other systemic symptoms. I also do not suspect C. difficile at this time given she is only having 3-4 episodes of diarrhea per day. Pneumonia is unlikely given her cough is nonproductive, she is afebrile, not tachycardic. She is is well appearing. I also do not think this episode represents influenza as she does not have fever or body aches. Patient does complain of a sore throat, however she does not have anterior cervical lymphadenopathy, she does have a cough, she is afebrile, there are no pharyngeal exudates on exam- so I do not think GAS is likely. I think pregnancy is a possibility, however patient did have a negative UPT 2 weeks ago. Patient does not want a UPT repeated today. She is adamant that she has no chance of being pregnant. She is on multiple forms of birth control.  - increase her fluid intake -Return for a nurse visit for flu shot when she is healthy (patient wanted flu shot today) -Provided patient with stool sample cups to take home with her. She was instructed to provide [place] with a sample if her symptoms have not improved within the next week. I asked patient to provide a sample that is as fresh as possible. She plans to call her clinic next week if her symptoms have not improved and after scheduling an appointment she will provide a sample. If patient does return to clinic I will plan to order a stool pathogen panel.

## 2013-10-20 NOTE — Telephone Encounter (Signed)
Pt calls stating she has N&V, diarrhea x 1 week, feels really bad wants to be seen this am. Added to dr Hollace Kinnier am schedule this am, 1030

## 2013-10-20 NOTE — Telephone Encounter (Signed)
Agree with the plan.

## 2013-10-20 NOTE — Progress Notes (Signed)
Patient ID: [name], female   DOB: 02/14/1986, 27 y.o.   MRN: [id number] HPI The patient is a 27 y.o. female with a history of OSA, GERD, borderline PD,  Bipolar disorder who presents today for an acute visit.  Patient reports having nausea vomiting and diarrhea for one week. She reports having 3-4 episodes of nonbloody, watery diarrhea each day. She reports that after she eats or drinks anything she has an episode of diarrhea. She also reports having nausea which is worse in the morning and afternoon, but seems to improve at night. She has vomited once each morning since onset of these symptoms, nonbloody emesis. She has been able to keep down both food and water over the last week, appetite has been normal. Patient also reports having a sore throat, rhinorrhea, nonproductive cough over the past week. Her brother is sick at home with URI symptoms. No contacts with GI symptoms. No fevers, chills, body aches, abdominal pain, dysuria, hematuria, vaginal discharge, new sexual partners, chest pain, shortness of breath. Patient is sexually active with one female partner for the past month, + Implanon, + condoms. She reports there is absolutely no chance she is pregnant. She had a negative UPT and workup for UTI and STIs in the ED approximately 2 weeks ago.  ROS: General: no fevers, chills, changes in weight, changes in appetite Skin: no rash HEENT: see HPI Pulm: see HPI CV: no chest pain, palpitations, shortness of breath Abd: See HPI GU: no dysuria, hematuria, polyuria Ext: no arthralgias, myalgias Neuro: no weakness, numbness, or tingling  Filed Vitals:   10/20/13 1035  BP: 124/84  Pulse: 78  Temp: 98.6 F (37 C)   Physical Exam General: alert, cooperative, comfortable appearing, in no apparent distress HEENT: pupils equal round and reactive to light, vision grossly intact, oropharynx clear and mildly erythematous, no pharyngeal exudates Neck: supple, no lymphadenopathy Lungs: clear  to ascultation bilaterally, normal work of respiration, no wheezes, rales, ronchi Heart: regular rate and rhythm, no murmurs, gallops, or rubs Abdomen: obese, soft, mild TTP RUQ and RLQ, non-distended, normal bowel sounds Extremities: warm extremities bilaterally, no BLE edema Neurologic: alert & oriented X3, cranial nerves II-XII intact, strength grossly intact, sensation intact to light touch  Current Outpatient Prescriptions on File Prior to Visit  Medication Sig Dispense Refill  . albuterol (PROVENTIL HFA;VENTOLIN HFA) 108 (90 BASE) MCG/ACT inhaler Inhale 2 puffs into the lungs every 6 (six) hours as needed for wheezing.  1 Inhaler  11  . ARIPiprazole (ABILIFY) 30 MG tablet Take 30 mg by mouth at bedtime.      . carbamazepine (TEGRETOL) 200 MG tablet Take 400 mg by mouth 2 (two) times daily.      . citalopram (CELEXA) 20 MG tablet Take 20 mg by mouth daily.      . fluconazole (DIFLUCAN) 150 MG tablet Take 1 tablet (150 mg total) by mouth once.  1 tablet  0  . ibuprofen (ADVIL,MOTRIN) 600 MG tablet Take 1 tablet (600 mg total) by mouth every 6 (six) hours as needed for pain.  30 tablet  0  . LORazepam (ATIVAN) 0.5 MG tablet Take 1 tablet (0.5 mg total) by mouth every 8 (eight) hours as needed for anxiety.  30 tablet  0  . omeprazole (PRILOSEC) 20 MG capsule Take 1 capsule (20 mg total) by mouth daily.  30 capsule  0  . trazodone (DESYREL) 300 MG tablet Take 300 mg by mouth at bedtime.  No current facility-administered medications on file prior to visit.    Assessment/Plan

## 2013-10-31 ENCOUNTER — Emergency Department (HOSPITAL_COMMUNITY)
Admission: EM | Admit: 2013-10-31 | Discharge: 2013-10-31 | Discharge status: Against Medical Advice | Payer: Medicare Other | Attending: Emergency Medicine | Admitting: Emergency Medicine

## 2013-10-31 ENCOUNTER — Encounter (HOSPITAL_COMMUNITY): Payer: Self-pay | Admitting: Emergency Medicine

## 2013-10-31 DIAGNOSIS — R197 Diarrhea, unspecified: Secondary | ICD-10-CM | POA: Insufficient documentation

## 2013-10-31 DIAGNOSIS — F411 Generalized anxiety disorder: Secondary | ICD-10-CM | POA: Insufficient documentation

## 2013-10-31 DIAGNOSIS — R509 Fever, unspecified: Secondary | ICD-10-CM | POA: Insufficient documentation

## 2013-10-31 DIAGNOSIS — F319 Bipolar disorder, unspecified: Secondary | ICD-10-CM | POA: Insufficient documentation

## 2013-10-31 DIAGNOSIS — Z87440 Personal history of urinary (tract) infections: Secondary | ICD-10-CM | POA: Insufficient documentation

## 2013-10-31 DIAGNOSIS — Z8719 Personal history of other diseases of the digestive system: Secondary | ICD-10-CM | POA: Insufficient documentation

## 2013-10-31 DIAGNOSIS — Z888 Allergy status to other drugs, medicaments and biological substances status: Secondary | ICD-10-CM | POA: Insufficient documentation

## 2013-10-31 DIAGNOSIS — Z139 Encounter for screening, unspecified: Secondary | ICD-10-CM

## 2013-10-31 DIAGNOSIS — K644 Residual hemorrhoidal skin tags: Secondary | ICD-10-CM | POA: Insufficient documentation

## 2013-10-31 DIAGNOSIS — F909 Attention-deficit hyperactivity disorder, unspecified type: Secondary | ICD-10-CM | POA: Insufficient documentation

## 2013-10-31 DIAGNOSIS — J45909 Unspecified asthma, uncomplicated: Secondary | ICD-10-CM | POA: Insufficient documentation

## 2013-10-31 DIAGNOSIS — Z79899 Other long term (current) drug therapy: Secondary | ICD-10-CM | POA: Insufficient documentation

## 2013-10-31 LAB — OCCULT BLOOD, POC DEVICE: Fecal Occult Bld: NEGATIVE

## 2013-10-31 NOTE — Discharge Instructions (Signed)
If you change your mind and would like to complete your evaluation and testing in the Emergency department please return at anytime.  Call for a follow up appointment with Dr. Dierdre Searles for further evaluation of your rectal bleeding.  Return if Symptoms worsen.   Take medication as prescribed.

## 2013-10-31 NOTE — ED Notes (Signed)
Patient states diarrhea x 2-3 days with bright red blood in toilet.  Patient also states some vomiting, patient denies abdominal pain
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
## 2013-10-31 NOTE — ED Notes (Signed)
Patient refusing any more services or examinations, patient signed out AMA

## 2013-11-03 NOTE — ED Provider Notes (Signed)
Medical screening examination/treatment/procedure(s) were performed by non-physician practitioner and as supervising physician I was immediately available for consultation/collaboration.  EKG Interpretation   None         Gavin Pound. Baneen Wieseler, MD 11/03/13 1704

## 2013-11-20 ENCOUNTER — Emergency Department (INDEPENDENT_AMBULATORY_CARE_PROVIDER_SITE_OTHER)
Admission: EM | Admit: 2013-11-20 | Discharge: 2013-11-20 | Disposition: A | Discharge status: Home / Self Care | Payer: Medicare HMO | Source: Home / Self Care | Attending: Family Medicine | Admitting: Family Medicine

## 2013-11-20 ENCOUNTER — Encounter (HOSPITAL_COMMUNITY): Payer: Self-pay | Admitting: Emergency Medicine

## 2013-11-20 DIAGNOSIS — R071 Chest pain on breathing: Secondary | ICD-10-CM

## 2013-11-20 DIAGNOSIS — R0789 Other chest pain: Secondary | ICD-10-CM

## 2013-11-20 MED ORDER — DICLOFENAC POTASSIUM 50 MG PO TABS: 50.0000 mg | ORAL_TABLET | Freq: Three times a day (TID) | ORAL | Status: DC

## 2013-11-20 NOTE — ED Notes (Signed)
Pt here with sudden sternal CP,nonradiating that started this am. States the pain woke her up. Appears calm and in no distress. Pt worsens with touch 6/10

## 2013-11-20 NOTE — ED Provider Notes (Signed)
CSN: [id number]     Arrival date & time 11/20/13  1659 History   First MD Initiated Contact with Patient 11/20/13 1710     Chief Complaint  Patient presents with  . Chest Pain   (Consider location/radiation/quality/duration/timing/severity/associated sxs/prior Treatment) Patient is a 27 y.o. female presenting with chest pain. The history is provided by the patient.  Chest Pain Pain location:  L lateral chest Pain quality: sharp and stabbing   Pain radiates to:  Does not radiate Pain radiates to the back: no   Pain severity:  Moderate Onset quality:  Sudden Duration:  10 hours Progression:  Unchanged Chronicity:  New Context: movement and raising an arm   Worsened by:  Movement and certain positions Associated symptoms: no abdominal pain, no fever and no palpitations     Past Medical History  Diagnosis Date  . Bipolar disorder   . Attention deficit hyperactivity disorder   . Depression   . Anxiety   . Asthma   . UTI (urinary tract infection)     Completed Cipro 08/06/12  . Gastritis    Past Surgical History  Procedure Laterality Date  . Mouth surgery    . Eye muscle surgery Bilateral     07/29/12   Family History  Problem Relation Age of Onset  . Depression Mother   . Depression Father   . Depression Brother   . Colon cancer Mother   . Colon polyps Mother   . Liver disease    . Kidney disease     History  Substance Use Topics  . Smoking status: Never Smoker   . Smokeless tobacco: Never Used  . Alcohol Use: No   OB History   Grav Para Term Preterm Abortions TAB SAB Ect Mult Living   0 0             Review of Systems  Constitutional: Negative.  Negative for fever.  Respiratory: Negative.   Cardiovascular: Positive for chest pain. Negative for palpitations.  Gastrointestinal: Negative for abdominal pain.    Allergies  Depakote; Methylphenidate derivatives; and Prozac  Home Medications   Current Outpatient Rx  Name  Route  Sig  Dispense  Refill  .  albuterol (PROVENTIL HFA;VENTOLIN HFA) 108 (90 BASE) MCG/ACT inhaler   Inhalation   Inhale 2 puffs into the lungs every 6 (six) hours as needed for wheezing.   1 Inhaler   11   . ARIPiprazole (ABILIFY) 30 MG tablet   Oral   Take 30 mg by mouth at bedtime.         . carbamazepine (TEGRETOL) 200 MG tablet   Oral   Take 400 mg by mouth 2 (two) times daily.         . citalopram (CELEXA) 20 MG tablet   Oral   Take 20 mg by mouth daily.         . diclofenac (CATAFLAM) 50 MG tablet   Oral   Take 1 tablet (50 mg total) by mouth 3 (three) times daily.   21 tablet   0   . fluconazole (DIFLUCAN) 150 MG tablet   Oral   Take 1 tablet (150 mg total) by mouth once.   1 tablet   0   . ibuprofen (ADVIL,MOTRIN) 600 MG tablet   Oral   Take 1 tablet (600 mg total) by mouth every 6 (six) hours as needed for pain.   30 tablet   0   . LORazepam (ATIVAN) 0.5 MG tablet   Oral  Take 1 tablet (0.5 mg total) by mouth every 8 (eight) hours as needed for anxiety.   30 tablet   0   . omeprazole (PRILOSEC) 20 MG capsule   Oral   Take 1 capsule (20 mg total) by mouth daily.   30 capsule   0   . trazodone (DESYREL) 300 MG tablet   Oral   Take 300 mg by mouth at bedtime.          BP 130/68  Pulse 71  Temp(Src) 98.2 F (36.8 C) (Oral)  Resp 16  SpO2 100%  LMP 11/11/2013 Physical Exam  Nursing note and vitals reviewed. Constitutional: She is oriented to person, place, and time. She appears well-developed and well-nourished.  Neck: Normal range of motion. Neck supple.  Cardiovascular: Regular rhythm, normal heart sounds and intact distal pulses.   Pulmonary/Chest: Effort normal and breath sounds normal. She exhibits tenderness.  Abdominal: Soft. Bowel sounds are normal.  Lymphadenopathy:    She has no cervical adenopathy.  Neurological: She is alert and oriented to person, place, and time.  Skin: Skin is warm and dry.    ED Course  Procedures (including critical care  time) Labs Review Labs Reviewed - No data to display Imaging Review No results found.  EKG Interpretation    Date/Time:    Ventricular Rate:    PR Interval:    QRS Duration:   QT Interval:    QTC Calculation:   R Axis:     Text Interpretation:              MDM      Linna Hoff, MD 11/20/13 1733

## 2013-11-23 ENCOUNTER — Encounter: Payer: Self-pay | Admitting: Internal Medicine

## 2013-11-23 ENCOUNTER — Ambulatory Visit (INDEPENDENT_AMBULATORY_CARE_PROVIDER_SITE_OTHER): Payer: Medicare HMO | Admitting: Internal Medicine

## 2013-11-23 VITALS — BP 116/80 | HR 89 | Temp 98.6°F | Ht 65.0 in | Wt 251.8 lb

## 2013-11-23 DIAGNOSIS — M722 Plantar fascial fibromatosis: Secondary | ICD-10-CM

## 2013-11-23 MED ORDER — MELOXICAM 7.5 MG PO TABS: 7.5000 mg | ORAL_TABLET | Freq: Every day | ORAL | Status: DC

## 2013-11-23 NOTE — Patient Instructions (Signed)
Plantar Fasciitis Plantar fasciitis is a common condition that causes foot pain. It is soreness (inflammation) of the band of tough fibrous tissue on the bottom of the foot that runs from the heel bone (calcaneus) to the ball of the foot. The cause of this soreness may be from excessive standing, poor fitting shoes, running on hard surfaces, being overweight, having an abnormal walk, or overuse (this is common in runners) of the painful foot or feet. It is also common in aerobic exercise dancers and ballet dancers. SYMPTOMS  Most people with plantar fasciitis complain of:  Severe pain in the morning on the bottom of their foot especially when taking the first steps out of bed. This pain recedes after a few minutes of walking.  Severe pain is experienced also during walking following a long period of inactivity.  Pain is worse when walking barefoot or up stairs DIAGNOSIS   Your caregiver will diagnose this condition by examining and feeling your foot.  Special tests such as X-rays of your foot, are usually not needed. PREVENTION   Consult a sports medicine professional before beginning a new exercise program.  Walking programs offer a good workout. With walking there is a lower chance of overuse injuries common to runners. There is less impact and less jarring of the joints.  Begin all new exercise programs slowly. If problems or pain develop, decrease the amount of time or distance until you are at a comfortable level.  Wear good shoes and replace them regularly.  Stretch your foot and the heel cords at the back of the ankle (Achilles tendon) both before and after exercise.  Run or exercise on even surfaces that are not hard. For example, asphalt is better than pavement.  Do not run barefoot on hard surfaces.  If using a treadmill, vary the incline.  Do not continue to workout if you have foot or joint problems. Seek professional help if they do not improve. HOME CARE INSTRUCTIONS     Avoid activities that cause you pain until you recover.  Use ice or cold packs on the problem or painful areas after working out.  Only take over-the-counter or prescription medicines for pain, discomfort, or fever as directed by your caregiver.  Soft shoe inserts or athletic shoes with air or gel sole cushions may be helpful.  If problems continue or become more severe, consult a sports medicine caregiver or your own health care provider. Cortisone is a potent anti-inflammatory medication that may be injected into the painful area. You can discuss this treatment with your caregiver. MAKE SURE YOU:   Understand these instructions.  Will watch your condition.  Will get help right away if you are not doing well or get worse. Document Released: 08/26/2001 Document Revised: 02/23/2012 Document Reviewed: 10/25/2008 [employer] Patient Information 2014 [employer].  

## 2013-11-23 NOTE — Assessment & Plan Note (Signed)
pt describes classic symptoms with heel pain worse with first steps in AM and pain with dorsiflexion on physical exam. Pt seems to have a low foot arch as well that maybe exacerbating the pain as she wears only flat shoes such as flip flops and no supportive shoes. No muscle, joint, or nerve deficits appreciated on exam.  -Mobic  -plantar fasciitis exercises provided -podiatry referral for possible orthotics

## 2013-11-23 NOTE — Progress Notes (Signed)
Subjective:   Patient ID: [name] female   DOB: [birth year] 27 y.o.   MRN: [id number]  HPI: [name] is a 27 y.o. presents for acute bilateral foot pain. She describes heel pain that is worse with the first steps in the morning or after a period of inactivity. The pain lessens with gradually increased activity, but worsens towards the end of the day. She denies any recent trauma to the area or previous pain like this in the past. No previous injuries. No associated muscle weakness or decreased nerve sensation. She mostly wears flip flops and never tennis or supportive shoes. She has not taken any OTC pain medication to help with the pain. She is still able to ambulate with out any concern or trouble except pain.   She denied any other systemic symptoms of fever/chills, nausea/vomiting/diarrhea, rashes, other joint or muscle pain. All other ROS was negative.    Past Medical History  Diagnosis Date  . Bipolar disorder   . Attention deficit hyperactivity disorder   . Depression   . Anxiety   . Asthma   . UTI (urinary tract infection)     Completed Cipro 08/06/12  . Gastritis    Current Outpatient Prescriptions  Medication Sig Dispense Refill  . albuterol (PROVENTIL HFA;VENTOLIN HFA) 108 (90 BASE) MCG/ACT inhaler Inhale 2 puffs into the lungs every 6 (six) hours as needed for wheezing.  1 Inhaler  11  . ARIPiprazole (ABILIFY) 30 MG tablet Take 30 mg by mouth at bedtime.      . carbamazepine (TEGRETOL) 200 MG tablet Take 400 mg by mouth 2 (two) times daily.      . citalopram (CELEXA) 20 MG tablet Take 20 mg by mouth daily.      . diclofenac (CATAFLAM) 50 MG tablet Take 1 tablet (50 mg total) by mouth 3 (three) times daily.  21 tablet  0  . fluconazole (DIFLUCAN) 150 MG tablet Take 1 tablet (150 mg total) by mouth once.  1 tablet  0  . ibuprofen (ADVIL,MOTRIN) 600 MG tablet Take 1 tablet (600 mg total) by mouth every 6 (six) hours as needed for pain.  30 tablet  0  .  LORazepam (ATIVAN) 0.5 MG tablet Take 1 tablet (0.5 mg total) by mouth every 8 (eight) hours as needed for anxiety.  30 tablet  0  . meloxicam (MOBIC) 7.5 MG tablet Take 1 tablet (7.5 mg total) by mouth daily.  30 tablet  2  . omeprazole (PRILOSEC) 20 MG capsule Take 1 capsule (20 mg total) by mouth daily.  30 capsule  0  . trazodone (DESYREL) 300 MG tablet Take 300 mg by mouth at bedtime.       No current facility-administered medications for this visit.   Family History  Problem Relation Age of Onset  . Depression Mother   . Depression Father   . Depression Brother   . Colon cancer Mother   . Colon polyps Mother   . Liver disease    . Kidney disease     History   Social History  . Marital Status: Single    Spouse Name: N/A    Number of Children: 0  . Years of Education: N/A   Social History Main Topics  . Smoking status: Never Smoker   . Smokeless tobacco: Never Used  . Alcohol Use: No  . Drug Use: No  . Sexual Activity: Yes    Birth Control/ Protection: Implant   Other Topics Concern  .  None   Social History Narrative  . None   Review of Systems: Constitutional: Denies fever, chills, diaphoresis, appetite change and fatigue.  HEENT: Denies photophobia, eye pain, redness, hearing loss, ear pain, congestion, sore throat, rhinorrhea, sneezing, mouth sores, trouble swallowing, neck pain, neck stiffness and tinnitus.   Respiratory: Denies SOB, DOE, cough, chest tightness,  and wheezing.   Cardiovascular: Denies chest pain, palpitations and leg swelling.  Gastrointestinal: Denies nausea, vomiting, abdominal pain, diarrhea, constipation, blood in stool and abdominal distention.  Genitourinary: Denies dysuria, urgency, frequency, hematuria, flank pain and difficulty urinating.  Endocrine: Denies: hot or cold intolerance, sweats, changes in hair or nails, polyuria, polydipsia. Musculoskeletal: Denies myalgias, back pain, joint swelling, arthralgias and gait problem.  Skin:  Denies pallor, rash and wound.  Neurological: Denies dizziness, seizures, syncope, weakness, light-headedness, numbness and headaches.  Hematological: Denies adenopathy. Easy bruising, personal or family bleeding history  Psychiatric/Behavioral: Denies suicidal ideation, mood changes, confusion, nervousness, sleep disturbance and agitation  Objective:  Physical Exam: Filed Vitals:   11/23/13 1432  BP: 116/80  Pulse: 89  Temp: 98.6 F (37 C)  TempSrc: Oral  Height: 5\' 5"  (1.651 m)  Weight: 251 lb 12.8 oz (114.216 kg)   General: resting in bed HEENT: PERRL, EOMI, no scleral icterus Cardiac: RRR, no rubs, murmurs or gallops Pulm: clear to auscultation bilaterally, moving normal volumes of air Abd: soft, nontender, nondistended, BS present Ext: warm and well perfused, no pedal edema MS: tenderness with dorsiflexion of feet bilaterally, 5/5 LE strength feet bilaterally, normal nerve sensation of feet bilaterally, no joint effusions or tenderness of feet bilaterally, slight low foot arch bilaterally Skin: warm, moist, well perfused Neuro: alert and oriented X3, cranial nerves II-XII grossly intact   Assessment & Plan:  1. Plantar fasciitis: pt describes classic symptoms with heel pain worse with first steps in AM and pain with dorsiflexion on physical exam. Pt seems to have a low foot arch as well that maybe exacerbating the pain as she wears only flat shoes such as flip flops and no supportive shoes. No muscle, joint, or nerve deficits appreciated on exam.  -Mobic  -plantar fasciitis exercises provided -podiatry referral for possible orthotics  Pt was discussed with Dr. Kem Kays

## 2013-11-29 ENCOUNTER — Ambulatory Visit: Payer: Self-pay | Admitting: Internal Medicine

## 2013-11-29 ENCOUNTER — Encounter: Payer: Self-pay | Admitting: Internal Medicine

## 2013-11-29 ENCOUNTER — Ambulatory Visit (INDEPENDENT_AMBULATORY_CARE_PROVIDER_SITE_OTHER): Payer: Medicare HMO | Admitting: Internal Medicine

## 2013-11-29 VITALS — BP 119/85 | HR 81 | Temp 97.5°F | Ht 66.0 in | Wt 250.1 lb

## 2013-11-29 DIAGNOSIS — J069 Acute upper respiratory infection, unspecified: Secondary | ICD-10-CM

## 2013-11-29 DIAGNOSIS — J020 Streptococcal pharyngitis: Secondary | ICD-10-CM | POA: Insufficient documentation

## 2013-11-29 DIAGNOSIS — J029 Acute pharyngitis, unspecified: Secondary | ICD-10-CM

## 2013-11-29 LAB — RAPID STREP SCREEN (MED CTR MEBANE ONLY): Streptococcus, Group A Screen (Direct): POSITIVE — AB

## 2013-11-29 MED ORDER — SALINE SPRAY 0.65 % NA SOLN: 1.0000 | NASAL | Status: DC | PRN

## 2013-11-29 MED ORDER — GUAIFENESIN ER 600 MG PO TB12: 600.0000 mg | ORAL_TABLET | Freq: Two times a day (BID) | ORAL | Status: DC

## 2013-11-29 MED ORDER — FLUTICASONE PROPIONATE 50 MCG/ACT NA SUSP: 1.0000 | Freq: Every day | NASAL | Status: DC

## 2013-11-29 NOTE — Assessment & Plan Note (Addendum)
Productive cough with clear mucus, nasal congestion, PND, and sore throat x2d. No fevers or chills. Mom and brother with similar symptoms. On exam, lungs are CTAB, nasal mucosa erythematous with mild purulence, turbinates edematous. Mild PNA, throat erythematous. Mild exudates located at the left tonsil. Her symptoms are likely from viral uri given that her mother and brother have similar symptoms. Will test for strep given her tonsillar exudates and throat pain/erythema, which is lower on the differential given that she is afebrile, has no cervical adenopathy, and has a cough and rhinorrhea.  - Checking rapid strep test - Mucinex 600mg  BID PRN congestion - Nasal saline spray 1-2 sprays in each nostril BID - Continue Flonase 1 spray in each nostril daily.

## 2013-11-29 NOTE — Assessment & Plan Note (Addendum)
Productive cough with clear mucus, nasal congestion, PND, and sore throat x2d. No fevers or chills. Mom and brother with similar symptoms. On exam, lungs are CTAB, nasal mucosa erythematous with mild purulence, turbinates edematous. Mild PNA, throat erythematous. Mild exudates located at the left tonsil. Her symptoms are likely from viral uri given that her mother and brother have similar symptoms. Will test for strep given her tonsillar exudates and throat pain/erythema, which is lower on the differential given that she is afebrile, has no cervical adenopathy, and has a cough and rhinorrhea.  - Checking rapid strep test - Mucinex 600mg  BID PRN congestion - Nasal saline spray 1-2 sprays in each nostril BID - Continue Flonase 1 spray in each nostril daily.  Addendum: 11/30/13: Rapid strep test positive, confirming group A strep. Treating with Clindamycin 300mg  po q8h x10 days; abx e-prescribed to the patient's pharmacy. Patient and her mother were notified.

## 2013-11-29 NOTE — Progress Notes (Signed)
Patient ID: CHERRISE OCCHIPINTI, female   DOB: 01-10-1986, 27 y.o.   MRN: [id number]  Subjective:   Patient ID: AMAYRANY CAFARO female   DOB: Sep 02, 1986 27 y.o.   MRN: [id number]  HPI: Ms.Nashayla R Clopper is a 27 y.o. F w/ PMH bipolar disorder, depression , ADHD, anxiety, and asthma presents today with URI symptoms.   She endorses a productive cough with clear mucus, sore throat, and runny nose x2 days. She denies fevers or chills. +sick contacts with mother and brother. +emesis this am which consisted of entirely clear mucus per pt. She denies any diarrhea or constipation.   Pt states that she did receive her flu vaccine this year but is unsure when.    Past Medical History  Diagnosis Date  . Bipolar disorder   . Attention deficit hyperactivity disorder   . Depression   . Anxiety   . Asthma   . UTI (urinary tract infection)     Completed Cipro 08/06/12  . Gastritis    Current Outpatient Prescriptions  Medication Sig Dispense Refill  . albuterol (PROVENTIL HFA;VENTOLIN HFA) 108 (90 BASE) MCG/ACT inhaler Inhale 2 puffs into the lungs every 6 (six) hours as needed for wheezing.  1 Inhaler  11  . ARIPiprazole (ABILIFY) 30 MG tablet Take 30 mg by mouth at bedtime.      . carbamazepine (TEGRETOL) 200 MG tablet Take 400 mg by mouth 2 (two) times daily.      . citalopram (CELEXA) 20 MG tablet Take 20 mg by mouth daily.      . diclofenac (CATAFLAM) 50 MG tablet Take 1 tablet (50 mg total) by mouth 3 (three) times daily.  21 tablet  0  . fluconazole (DIFLUCAN) 150 MG tablet Take 1 tablet (150 mg total) by mouth once.  1 tablet  0  . ibuprofen (ADVIL,MOTRIN) 600 MG tablet Take 1 tablet (600 mg total) by mouth every 6 (six) hours as needed for pain.  30 tablet  0  . LORazepam (ATIVAN) 0.5 MG tablet Take 1 tablet (0.5 mg total) by mouth every 8 (eight) hours as needed for anxiety.  30 tablet  0  . meloxicam (MOBIC) 7.5 MG tablet Take 1 tablet (7.5 mg total) by mouth daily.  30 tablet  2  .  omeprazole (PRILOSEC) 20 MG capsule Take 1 capsule (20 mg total) by mouth daily.  30 capsule  0  . trazodone (DESYREL) 300 MG tablet Take 300 mg by mouth at bedtime.       No current facility-administered medications for this visit.   Family History  Problem Relation Age of Onset  . Depression Mother   . Depression Father   . Depression Brother   . Colon cancer Mother   . Colon polyps Mother   . Liver disease    . Kidney disease     History   Social History  . Marital Status: Single    Spouse Name: N/A    Number of Children: 0  . Years of Education: N/A   Social History Main Topics  . Smoking status: Never Smoker   . Smokeless tobacco: Never Used  . Alcohol Use: No  . Drug Use: No  . Sexual Activity: Yes    Birth Control/ Protection: Implant   Other Topics Concern  . None   Social History Narrative  . None   Review of Systems: A 12 point ROS was performed; pertinent positives and negatives were noted in the HPI   Objective:  Physical Exam: Filed Vitals:   11/29/13 1445  BP: 119/85  Pulse: 81  Temp: 97.5 F (36.4 C)  TempSrc: Oral  Height: 5\' 6"  (1.676 m)  Weight: 250 lb 1.6 oz (113.445 kg)  SpO2: 97%   Constitutional: Vital signs reviewed.  Patient is a well-developed and well-nourished female in no acute distress and cooperative with exam.  Head: Normocephalic and atraumatic Ear: TM normal bilaterally. Cerumen present in bilateral ear canals.  Nose: +erythema and mild purulent drainage noted.  Turbinates edematous and erythematous. Mouth: +erythema with mild exudates on the left tonsil, MMM Eyes: PERRL, EOMI, conjunctivae normal, No scleral icterus.  Neck: Supple, Trachea midline normal ROM Cardiovascular: RRR, no MRG, pulses symmetric and intact bilaterally Pulmonary/Chest: Normal respiratory effort, CTAB, no wheezes, rales, or rhonchi Abdominal: Soft. Non-tender, non-distended, bowel sounds are normal, no masses, organomegaly, or guarding present.   Musculoskeletal: No joint deformities, erythema, or stiffness Hematology: No cervical adenopathy.  Neurological: A&O x3, non focal  Skin: Warm, dry and intact. Multiple tattoos  Psychiatric: Normal mood and affect. speech and behavior appear normal.   Assessment & Plan:   Please refer to Problem List based Assessment and Plan

## 2013-11-29 NOTE — Patient Instructions (Signed)
Use the saline nasal spray (Ocean) 1-2 times a day and then blow your nose really well. Then use the Flonase nasal spray.   Take the Mucinex 1 tablet 2 times a day as needed for congestion.   I will call you with the results of your strep test. If you do not hear from [place] tomorrow, call the clinic.

## 2013-11-30 MED ORDER — CLINDAMYCIN HCL 300 MG PO CAPS: 300.0000 mg | ORAL_CAPSULE | Freq: Three times a day (TID) | ORAL | Status: DC

## 2013-11-30 NOTE — Addendum Note (Signed)
Addended by: Genelle Gather on: 11/30/2013 09:23 AM   Modules accepted: Orders

## 2013-12-01 NOTE — Progress Notes (Signed)
Case discussed with Dr. Glenn at the time of the visit.  We reviewed the resident's history and exam and pertinent patient test results.  I agree with the assessment, diagnosis, and plan of care documented in the resident's note.   

## 2013-12-01 NOTE — Progress Notes (Signed)
Case discussed with Dr. Sadek soon after the resident saw the patient.  We reviewed the resident's history and exam and pertinent patient test results.  I agree with the assessment, diagnosis, and plan of care documented in the resident's note. 

## 2013-12-10 ENCOUNTER — Emergency Department (HOSPITAL_COMMUNITY)
Admission: EM | Admit: 2013-12-10 | Discharge: 2013-12-10 | Discharge status: Against Medical Advice | Payer: Commercial Managed Care - HMO | Source: Home / Self Care

## 2013-12-10 NOTE — ED Notes (Signed)
Patient called from waiting area x2, not present.

## 2013-12-12 ENCOUNTER — Ambulatory Visit (INDEPENDENT_AMBULATORY_CARE_PROVIDER_SITE_OTHER): Payer: Medicare HMO | Admitting: Internal Medicine

## 2013-12-12 VITALS — BP 112/80 | HR 88 | Temp 98.1°F | Ht 66.0 in | Wt 252.3 lb

## 2013-12-12 DIAGNOSIS — M722 Plantar fascial fibromatosis: Secondary | ICD-10-CM

## 2013-12-12 MED ORDER — DICLOFENAC POTASSIUM 50 MG PO TABS: 50.0000 mg | ORAL_TABLET | Freq: Three times a day (TID) | ORAL | Status: DC

## 2013-12-12 NOTE — Progress Notes (Signed)
   Subjective:    Patient ID: [name], female    DOB: [birth year], 27 y.o.   MRN: [id number]  HPI   Pt stopped in to get her feet checked.  States that she has had bilateral foot pain and has taken the last of her Cataflam.  Missed her podiatry appt for orthotics as referred by PCP for bilateral plantar fasciitis. Has not tried icing, states that she has been trying to do the recommended stretches but "it makes it worse".   Review of Systems  Constitutional: Negative.   HENT: Negative.   Eyes: Negative.   Respiratory: Negative.   Cardiovascular: Negative.   Gastrointestinal: Negative.   Endocrine: Negative.   Genitourinary: Negative.   Musculoskeletal:       Bilateral foot pain.  Skin: Negative.   Allergic/Immunologic: Negative.   Neurological: Negative.   Hematological: Negative.   Psychiatric/Behavioral: Negative.        Objective:   Physical Exam  Constitutional: She is oriented to person, place, and time. She appears well-developed and well-nourished.  HENT:  Head: Normocephalic and atraumatic.  Eyes: Conjunctivae and EOM are normal. Pupils are equal, round, and reactive to light.  Cardiovascular: Normal rate.   Pulmonary/Chest: Effort normal.  Musculoskeletal: She exhibits no edema.       Right foot: She exhibits tenderness.       Left foot: She exhibits tenderness.  Neurological: She is oriented to person, place, and time.  Skin: Skin is warm and dry.          Assessment & Plan:  See separate problem-list charting: #1 bilateral plantar fasciitis: recommend ice, stretching, refilled diclofenac and keep Podiatry appt in Jan 2015

## 2013-12-12 NOTE — Patient Instructions (Signed)
We have refilled the diclofenac (cataflam) pain medicine for your foot pain. Ice your soles at least twice a day as described.  Take the pain medicine as needed. Keep your appointment with podiatry.  Plantar Fasciitis Plantar fasciitis is a common condition that causes foot pain. It is soreness (inflammation) of the band of tough fibrous tissue on the bottom of the foot that runs from the heel bone (calcaneus) to the ball of the foot. The cause of this soreness may be from excessive standing, poor fitting shoes, running on hard surfaces, being overweight, having an abnormal walk, or overuse (this is common in runners) of the painful foot or feet. It is also common in aerobic exercise dancers and ballet dancers. SYMPTOMS  Most people with plantar fasciitis complain of:  Severe pain in the morning on the bottom of their foot especially when taking the first steps out of bed. This pain recedes after a few minutes of walking.  Severe pain is experienced also during walking following a long period of inactivity.  Pain is worse when walking barefoot or up stairs DIAGNOSIS   Your caregiver will diagnose this condition by examining and feeling your foot.  Special tests such as X-rays of your foot, are usually not needed. PREVENTION   Consult a sports medicine professional before beginning a new exercise program.  Walking programs offer a good workout. With walking there is a lower chance of overuse injuries common to runners. There is less impact and less jarring of the joints.  Begin all new exercise programs slowly. If problems or pain develop, decrease the amount of time or distance until you are at a comfortable level.  Wear good shoes and replace them regularly.  Stretch your foot and the heel cords at the back of the ankle (Achilles tendon) both before and after exercise.  Run or exercise on even surfaces that are not hard. For example, asphalt is better than pavement.  Do not run  barefoot on hard surfaces.  If using a treadmill, vary the incline.  Do not continue to workout if you have foot or joint problems. Seek professional help if they do not improve. HOME CARE INSTRUCTIONS   Avoid activities that cause you pain until you recover.  Use ice or cold packs on the problem or painful areas after working out.  Only take over-the-counter or prescription medicines for pain, discomfort, or fever as directed by your caregiver.  Soft shoe inserts or athletic shoes with air or gel sole cushions may be helpful.  If problems continue or become more severe, consult a sports medicine caregiver or your own health care provider. Cortisone is a potent anti-inflammatory medication that may be injected into the painful area. You can discuss this treatment with your caregiver. MAKE SURE YOU:   Understand these instructions.  Will watch your condition.  Will get help right away if you are not doing well or get worse. Document Released: 08/26/2001 Document Revised: 02/23/2012 Document Reviewed: 10/25/2008 [facility] Patient Information 2014 [place], [place].

## 2013-12-12 NOTE — Progress Notes (Signed)
Case discussed with Dr. Schooler at time of visit.  We reviewed the resident's history and exam and pertinent patient test results.  I agree with the assessment, diagnosis, and plan of care documented in the resident's note. 

## 2013-12-30 ENCOUNTER — Telehealth: Payer: Self-pay | Admitting: *Deleted

## 2013-12-30 ENCOUNTER — Ambulatory Visit: Payer: Self-pay | Admitting: Internal Medicine

## 2013-12-30 NOTE — Telephone Encounter (Signed)
Pt called clinic c/o of chest pains past few days. Pt states she gave pain #7. Suggest to go to ER. Pt refuses to go to ER. Bertram Denver states at this point of time - no appt available for today. Pt then states having problems with stomach same time period - states upper abd area. Suggest to go to Urgent Care or ER. Hilda Blades Allie Gerhold RN 12/30/13 10AM

## 2014-01-26 ENCOUNTER — Encounter (HOSPITAL_COMMUNITY): Payer: Self-pay | Admitting: Emergency Medicine

## 2014-01-26 ENCOUNTER — Ambulatory Visit (HOSPITAL_COMMUNITY)
Admission: RE | Admit: 2014-01-26 | Discharge: 2014-01-26 | Disposition: A | Discharge status: Home / Self Care | Payer: Medicare HMO | Attending: Psychiatry | Admitting: Psychiatry

## 2014-01-26 ENCOUNTER — Emergency Department (HOSPITAL_COMMUNITY)
Admission: EM | Admit: 2014-01-26 | Discharge: 2014-01-26 | Discharge status: Against Medical Advice | Payer: Medicare HMO | Attending: Emergency Medicine | Admitting: Emergency Medicine

## 2014-01-26 DIAGNOSIS — J45909 Unspecified asthma, uncomplicated: Secondary | ICD-10-CM | POA: Diagnosis not present

## 2014-01-26 DIAGNOSIS — J111 Influenza due to unidentified influenza virus with other respiratory manifestations: Secondary | ICD-10-CM | POA: Insufficient documentation

## 2014-01-26 NOTE — ED Notes (Signed)
Pt states doesn't need to be seen and her mother is here to pick her up; walked out of ER

## 2014-01-26 NOTE — ED Notes (Signed)
Per ems pt c/o flu symptoms; ambulatory on arrival
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
## 2014-01-27 ENCOUNTER — Encounter (HOSPITAL_COMMUNITY): Payer: Self-pay

## 2014-01-27 ENCOUNTER — Telehealth (HOSPITAL_COMMUNITY): Payer: Self-pay

## 2014-02-03 NOTE — ED Provider Notes (Signed)
Patient LWBS  Osvaldo Shipper, MD 02/03/14 2328

## 2014-02-10 ENCOUNTER — Emergency Department (HOSPITAL_COMMUNITY)
Admission: EM | Admit: 2014-02-10 | Discharge: 2014-02-10 | Disposition: A | Discharge status: Home / Self Care | Payer: Medicare HMO | Source: Home / Self Care

## 2014-02-10 ENCOUNTER — Encounter (HOSPITAL_COMMUNITY): Payer: Self-pay | Admitting: *Deleted

## 2014-02-10 ENCOUNTER — Inpatient Hospital Stay (HOSPITAL_COMMUNITY)
Admission: AD | Admit: 2014-02-10 | Discharge: 2014-02-14 | DRG: 885 | Disposition: A | Discharge status: Home / Self Care | Payer: Medicare HMO | Attending: Psychiatry | Admitting: Psychiatry

## 2014-02-10 DIAGNOSIS — R45851 Suicidal ideations: Secondary | ICD-10-CM

## 2014-02-10 DIAGNOSIS — F319 Bipolar disorder, unspecified: Secondary | ICD-10-CM | POA: Diagnosis present

## 2014-02-10 DIAGNOSIS — F3132 Bipolar disorder, current episode depressed, moderate: Secondary | ICD-10-CM | POA: Diagnosis present

## 2014-02-10 MED ORDER — CARBAMAZEPINE ER 200 MG PO TB12
400.0000 mg | ORAL_TABLET | Freq: Two times a day (BID) | ORAL | Status: DC
  Administered 2014-02-10 – 2014-02-14 (×8): 400 mg via ORAL
  Filled 2014-02-10 (×4): qty 1
  Filled 2014-02-10: qty 2
  Filled 2014-02-10 (×3): qty 1
  Filled 2014-02-10: qty 12
  Filled 2014-02-10 (×3): qty 1
  Filled 2014-02-10: qty 12

## 2014-02-10 MED ORDER — CITALOPRAM HYDROBROMIDE 20 MG PO TABS
20.0000 mg | ORAL_TABLET | Freq: Every day | ORAL | Status: DC
  Administered 2014-02-11 – 2014-02-14 (×4): 20 mg via ORAL
  Filled 2014-02-10 (×4): qty 1
  Filled 2014-02-10: qty 3
  Filled 2014-02-10 (×3): qty 1

## 2014-02-10 MED ORDER — ARIPIPRAZOLE 15 MG PO TABS
15.0000 mg | ORAL_TABLET | Freq: Two times a day (BID) | ORAL | Status: DC
  Administered 2014-02-11 – 2014-02-14 (×7): 15 mg via ORAL
  Filled 2014-02-10 (×3): qty 1
  Filled 2014-02-10: qty 6
  Filled 2014-02-10 (×3): qty 1
  Filled 2014-02-10: qty 6
  Filled 2014-02-10 (×5): qty 1

## 2014-02-10 MED ORDER — PANTOPRAZOLE SODIUM 40 MG PO TBEC: 40.0000 mg | DELAYED_RELEASE_TABLET | Freq: Every day | ORAL | Status: DC

## 2014-02-10 MED ORDER — TRAZODONE HCL 150 MG PO TABS
300.0000 mg | ORAL_TABLET | Freq: Every day | ORAL | Status: DC
  Administered 2014-02-10 – 2014-02-13 (×4): 300 mg via ORAL
  Filled 2014-02-10 (×5): qty 2
  Filled 2014-02-10: qty 6
  Filled 2014-02-10 (×3): qty 2

## 2014-02-10 MED ORDER — ALUM & MAG HYDROXIDE-SIMETH 200-200-20 MG/5ML PO SUSP
30.0000 mL | ORAL | Status: DC | PRN
  Administered 2014-02-13: 30 mL via ORAL

## 2014-02-10 MED ORDER — MAGNESIUM HYDROXIDE 400 MG/5ML PO SUSP: 30.0000 mL | Freq: Every day | ORAL | Status: DC | PRN

## 2014-02-10 MED ORDER — TRAZODONE HCL 100 MG PO TABS: 200.0000 mg | ORAL_TABLET | Freq: Every day | ORAL | Status: DC

## 2014-02-10 MED ORDER — ACETAMINOPHEN 325 MG PO TABS: 650.0000 mg | ORAL_TABLET | Freq: Four times a day (QID) | ORAL | Status: DC | PRN
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
## 2014-02-10 NOTE — Progress Notes (Signed)
Psychoeducational Group Note  Date:  02/10/2014 Time:  2000   Group Topic/Focus:  Wrap-Up Group:   The focus of this group is to help patients review their daily goal of treatment and discuss progress on daily workbooks.  Participation Level: Did Not Attend  Participation Quality:  Not Applicable  Affect:  Not Applicable  Cognitive:  Not Applicable  Insight:  Not Applicable  Engagement in Group: Not Applicable  Additional Comments:    Sharmon Revere 02/10/2014, 10:02 PM

## 2014-02-10 NOTE — Progress Notes (Signed)
Pt lives to be called [name] and came to [facility] as a walk in voluntary admission. Pt reports si thoughts with no specific plan, depression and anger. Pt lives with her mother, 27 yr old brother and an uncle. Pt states that she gets angry with her mother because she is "overprotective and controlling." Pt has missing upper teeth and wears dentures that are at her home. She has a hx of asthma and plantar fascitis. Pt has been hospitalized several times in the past and had an attempt last June by cutting her wrist. Pt does not working and states that she is looking for a job. She does not drive and has a boyfriend that drives her places along with her mother. Pt reports that she may have a yeast infection. Pt is negative for hi and voices. She is wanting her medication adjusted.

## 2014-02-10 NOTE — ED Notes (Signed)
Attempted to get EKG on pt and she refused EKG

## 2014-02-10 NOTE — Tx Team (Signed)
Initial Interdisciplinary Treatment Plan  PATIENT STRENGTHS: (choose at least two) Communication skills General fund of knowledge Physical Health Religious Affiliation Supportive family/friends  PATIENT STRESSORS: Medication change or noncompliance   PROBLEM LIST: Problem List/Patient Goals Date to be addressed Date deferred Reason deferred Estimated date of resolution  si thoughts 02/10/14     anger 02/10/14     depression 02/10/14                                          DISCHARGE CRITERIA:  Ability to meet basic life and health needs Improved stabilization in mood, thinking, and/or behavior Motivation to continue treatment in a less acute level of care Need for constant or close observation no longer present Reduction of life-threatening or endangering symptoms to within safe limits Safe-care adequate arrangements made Verbal commitment to aftercare and medication compliance  PRELIMINARY DISCHARGE PLAN: Outpatient therapy Return to previous living arrangement  PATIENT/FAMIILY INVOLVEMENT: This treatment plan has been presented to and reviewed with the patient, [name], and/or family member, .  The patient and family have been given the opportunity to ask questions and make suggestions.  Mosie Lukes 02/10/2014, 6:15 PM

## 2014-02-11 DIAGNOSIS — F29 Unspecified psychosis not due to a substance or known physiological condition: Secondary | ICD-10-CM

## 2014-02-11 NOTE — Progress Notes (Addendum)
Patient ID: [name], female   DOB: [birth year], 28 y.o.   MRN: [id number] D. Patient presents with depressed mood, affect blunted. She reports '' My mood is fine. I'm doing fine '' Patient completed self inventory and rates depression at 2/10 on depression scale 10 being worst depression 1 being least. Patient later requesting to sign 72 hour request for discharge form. She states '' I'm just ready to leave. I want to sign that form so that I know I can be out before Tuesday. '' A. Medications given as ordered. Support and encouragement provided. Discussed above information with Dr. Barney Drain. R. Patient has been visible on the unit,attending unit programming. No further voiced concerns at this time . Pt in no acute distress. Will continue to monitor q 15 minutes for safety.
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
## 2014-02-11 NOTE — [facility] Group Notes (Signed)
[place] Group Notes:  (Nursing/MHT/Case Management/Adjunct)  Date:  02/11/2014  Time:  10:23 AM  Type of Therapy:  Psychoeducational Skills--healthy coping skills  Participation Level:  Active  Participation Quality:  Inattentive  Affect:  Depressed  Cognitive:  Lacking  Insight:  Limited  Engagement in Group:  Lacking and Limited  Modes of Intervention:  Discussion, Education and Exploration  Summary of Progress/Problems:Psychoeducational review of healthy coping skills. Reviewed unhealthy coping , with a focus on healthy coping skills in areas that have historically been problematic for the patient. [name] states '' anger is my big problem, I broke a window before coming here '' Patient was able to identify healthy coping - listening to music and walking in place of unhealthy coping. [name] 02/11/2014, 10:23 AM
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
## 2014-02-11 NOTE — BHH Group Notes (Signed)
[facility] Group Notes:  (Nursing/MHT/Case Management/Adjunct)  Date:  02/11/2014  Time:  10:20 AM  Type of Therapy:  Psychoeducational Skills--self inventory review  Participation Level:  Active  Participation Quality:  Inattentive  Affect:  Depressed  Cognitive:  Lacking  Insight:  Limited  Engagement in Group:  Lacking  Modes of Intervention:  Discussion, Education and Exploration  Summary of Progress/Problems: self inventory review with RN.   [name] 02/11/2014, 10:20 AM
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
## 2014-02-11 NOTE — Progress Notes (Signed)
Adult Psychoeducational Group Note  Date:  02/11/2014 Time:  8:45 PM  Group Topic/Focus:  Wrap-Up Group:   The focus of this group is to help patients review their daily goal of treatment and discuss progress on daily workbooks.  Participation Level:  Active  Participation Quality:  Appropriate  Affect:  Appropriate  Cognitive:  Appropriate  Insight: Appropriate  Engagement in Group:  Engaged  Modes of Intervention:  Discussion  Additional Comments:The pt expressed that her positive coping skill is to yell in a pillow to calm her anxiety.  Nash Shearer 02/11/2014, 8:45 PM

## 2014-02-12 LAB — LIPID PANEL
Cholesterol: 156 mg/dL (ref 0–200)
HDL: 57 mg/dL (ref >39)
LDL Cholesterol: 79 mg/dL (ref 0–99)
Total CHOL/HDL Ratio: 2.7 RATIO
Triglycerides: 98 mg/dL (ref <150)
VLDL: 20 mg/dL (ref 0–40)

## 2014-02-12 LAB — CARBAMAZEPINE LEVEL, TOTAL: Carbamazepine Lvl: 9.2 ug/mL (ref 4.0–12.0)

## 2014-02-12 LAB — HEMOGLOBIN A1C
Hgb A1c MFr Bld: 5.4 % (ref <5.7)
Mean Plasma Glucose: 108 mg/dL (ref <117)

## 2014-02-12 NOTE — Progress Notes (Signed)
Adult Psychoeducational Group Note  Date:  02/12/2014 Time:  10:07 PM  Group Topic/Focus:  Wrap-Up Group:   The focus of this group is to help patients review their daily goal of treatment and discuss progress on daily workbooks.  Participation Level:  Minimal  Participation Quality:  minimal  Affect:  Flat  Cognitive:  Lacking  Insight: Limited  Engagement in Group:  Limited  Modes of Intervention:  Education, Exploration, Socialization and Support  Additional Comments:  Patient attended and participated in group tonight. She reports having a good day. She went to her groups, went down for meals but did not eat. Her mother visited. Her support system are her, mother, her brother, her daughter and her X boyfriend. Patient advised that she will like to get help from her social worker to get counseling.  Salley Scarlet [facility] 02/12/2014, 10:07 PM

## 2014-02-12 NOTE — Progress Notes (Signed)
Patient ID: SYEDA PRICKETT, female   DOB: 12/06/1986, 28 y.o.   MRN: [id number]  S:  She reports better mood. She is impulsive and has poor insight. Fair sleep now.  Psychiatric Specialty Exam:  Physical Exam   ROS none     General Appearance: Casual   Eye Contact:: Poor   Speech: Blocked   Volume: Decreased   Mood: Anxious   Affect: Blunt   Thought Process: Disorganized  At times  Orientation: Negative   Thought Content: Negative   Suicidal Thoughts: No   Homicidal Thoughts: No   Memory: Negative   Judgement: Impaired   Insight: Lacking   Psychomotor Activity: Decreased   Concentration: Poor   Recall: Poor   Fund of Knowledge:Poor   Language: Fair   Akathisia: No   Handed: Right   AIMS (if indicated):   Assets: Physical Health     No results found for this or any previous visit (from the past 72 hour(s)).  Psychological Evaluations:  Assessment:  DSM5:  AXIS I: Psychotic Disorder NOS  AXIS II: Deferred  AXIS III:  Past Medical History   Diagnosis  Date   .  Bipolar disorder    .  Attention deficit hyperactivity disorder    .  Depression    .  Anxiety    .  Asthma    .  UTI (urinary tract infection)      Completed Cipro 08/06/12   .  Gastritis     AXIS IV: other psychosocial or environmental problems  AXIS V: 21-30 behavior considerably influenced by delusions or hallucinations OR serious impairment in judgment, communication OR inability to function in almost all areas  Treatment Plan/Recommendations:  Continue current med's  Expand history  Treatment Plan Summary:  Daily contact with patient to assess and evaluate symptoms and progress in treatment

## 2014-02-12 NOTE — BHH Group Notes (Signed)
[facility] Group Notes:  (Nursing/MHT/Case Management/Adjunct)  Date:  02/12/2014  Time:  10:38 AM  Type of Therapy:  Psychoeducational Skills--self inventory review with RN.  Participation Level:  Active  Participation Quality:  Active   Affect:  Blunted   Cognitive:  Limited, but appropriate  Insight:  limited  Engagement in Group:  engaged  Modes of Intervention:  Discussion , education , exploration  Summary of Progress/Problems:  [name] 02/12/2014, 10:38 AM

## 2014-02-12 NOTE — BHH Group Notes (Signed)
[facility] Group Notes:  (Clinical Social Work)  02/12/2014   11:15am-12:00pm  Summary of Progress/Problems:  The main focus of today's process group was to listen to a variety of genres of music and to identify that different types of music provoke different responses.  The patient then was able to identify personally what was soothing for them, as well as energizing.  Handouts were used to record feelings evoked, as well as how patient can personally use this knowledge in sleep habits, with depression, and with other symptoms.  The patient expressed understanding of concepts, as well as knowledge of how each type of music affected her and how this can be used at home as a wellness/recovery tool.  She was very vocal in her likes and dislikes, showed little insight into other people feeling different from her.  Type of Therapy:  Music Therapy   Participation Level:  Active  Participation Quality:  Attentive and Sharing  Affect:  Blunted  Cognitive:  Oriented  Insight:  Engaged  Engagement in Therapy:  Engaged  Modes of Intervention:   Activity, Exploration  Selmer Dominion, LCSW 02/12/2014, 12:30pm

## 2014-02-12 NOTE — Progress Notes (Signed)
Patient ID: [name], female   DOB: 28-Oct-1986, 28 y.o.   MRN: [id number] D. Patient presents with depressed mood, affect blunted. She reports her mood is improving and reports ''the anger is getting better. I'm doing good. I am ready to go home'' A. Medications given as ordered. Support and encouragement provided. Discussed above information with Dr. Barney Drain. R. Patient has been visible on the unit,attending unit programming. No further voiced concerns at this time . Pt in no acute distress. Will continue to monitor q 15 minutes for safety.

## 2014-02-12 NOTE — BHH Group Notes (Signed)
[facility] Group Notes:  (Nursing/MHT/Case Management/Adjunct)  Date:  02/12/2014  Time:  10:45 AM   Type of Therapy: Psychoeducational Skills--healthy support systems RN.  Participation Level: Active  Participation Quality: Active  Affect: Blunted  Cognitive: Limited, but appropriate  Insight: limited  Engagement in Group: engaged  Modes of Intervention: Discussion , education , exploration  Summary of Progress/Problems: Psychoeducational review with a focus on healthy support systems. Patient identified areas of potential support and triggers. She states she plans to use her coping skills learned, as walking and deep breathing.   [name] 02/12/2014, 10:45 AM

## 2014-02-12 NOTE — Progress Notes (Signed)
D: Patient has a depressed mood and affect. Stated she is feeling better today. Plans to journal today and to write poetry. No complaints of pain. Appropriate in groups and milieu.Denies HI/SI  A: Patient given emotional support from RN. Patient given medications per MD orders. Patient encouraged to attend groups and unit activities. Patient encouraged to come to staff with any questions or concerns.  R: Patient remains cooperative and appropriate. Will continue to monitor patient for safety.

## 2014-02-13 MED ORDER — LOPERAMIDE HCL 2 MG PO CAPS
2.0000 mg | ORAL_CAPSULE | Freq: Three times a day (TID) | ORAL | Status: DC | PRN
  Administered 2014-02-14: 2 mg via ORAL
  Filled 2014-02-13: qty 1

## 2014-02-13 MED ORDER — LOPERAMIDE HCL 2 MG PO CAPS
4.0000 mg | ORAL_CAPSULE | Freq: Once | ORAL | Status: DC
Start: 2014-02-13 — End: 2014-02-13

## 2014-02-13 MED ORDER — LOPERAMIDE HCL 2 MG PO CAPS
4.0000 mg | ORAL_CAPSULE | Freq: Once | ORAL | Status: DC
  Filled 2014-02-13 (×2): qty 2

## 2014-02-13 NOTE — Progress Notes (Signed)
D: Pt presents anxious this morning with pressured speech, fidgety and pacing. Pt has poor insight for treatment and continues to request to be discharged. Pt denies SI/HI/AVH. Pt denies feeling depressed or aggressive today. Pt minimizes her symptoms and blames others for her problems. A: Medications administered as ordered per MD. Verbal support given. Pt encouraged to attend groups. 15 Minute checks performed for safety. R: Pt safety maintained at this time. Pt childlike and attention seeking throughout the day.

## 2014-02-13 NOTE — Progress Notes (Signed)
Adult Psychoeducational Group Note  Date:  02/13/2014 Time:  10:25 PM  Group Topic/Focus:  Wrap-Up Group:   The focus of this group is to help patients review their daily goal of treatment and discuss progress on daily workbooks.  Participation Level:  Active  Participation Quality:  Appropriate  Affect:  Appropriate  Cognitive:  Alert  Insight: Appropriate  Engagement in Group:  Engaged  Modes of Intervention:  Discussion  Additional Comments:  Pt stated she needs to learn to keep her mouth shut at times. She plans to continue her therapy and her medication after she goes home.  Wyonia Hough 02/13/2014, 10:25 PM
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
## 2014-02-13 NOTE — [facility] Group Notes (Signed)
[facility] LCSW Aftercare Discharge Planning Group Note   02/13/2014 10:07 AM  Participation Quality:  Active  Mood/Affect:  Blunted  Depression Rating:  0  Anxiety Rating:  3 (wanting to go home today)  Thoughts of Suicide:  No Will you contract for safety?   Yes  Current AVH:  No  Plan for Discharge/Comments:  [name] reportedly signed a 72 hour on 2/28. She is requesting DC today, however she will remain until 3/3.  [name] would like another provider for therapy, specifically in [facility] OPT clinic.  Patient will return to live with her mother, brother, and Uncle.   Transportation Means: Mother  Supports:Family.  [name]

## 2014-02-13 NOTE — Progress Notes (Signed)
D   Pt is anxious and nervous   She reports the doctor told her she would probably be discharged and she said she is ready to go   Pt is limited in her insight into her illness which appears to be normal for her  And does not seem willing to change   She does denie suicidal and homicidal ideation and is otherwise compliant with treatment   A  Verbal support given   Medications administered and effectiveness monitored    Q 15 min checks R   Pt safe at present

## 2014-02-13 NOTE — BHH Group Notes (Signed)
[facility] LCSW Group Therapy  02/13/2014 2:55 PM  Type of Therapy:  Group Therapy  Participation Level:  Minimal  Participation Quality:  Attentive  Affect:  Anxious  And tearful  Cognitive:  Alert and Oriented  Insight:  Limited  Engagement in Therapy:  Lacking  Modes of Intervention:  Discussion, Exploration and Support   Summary of Progress/Problems:  Group today consisted of the topic of obstacles.  Group discussed how to overcome problems, why problems persist and how to overcome problems in which group facilitated discussion.   [name] was in group for the entire time, awake and engaged in listening to others.  She cried several times, but never shared reasons for crying. She was respectful and added thoughts and small comments to the discussion, but had limited insight to process topic.  [name] 02/13/2014, 2:55 PM

## 2014-02-13 NOTE — Progress Notes (Signed)
Patient ID: [name], female   DOB: [birth year], 28 y.o.   MRN: [id number] D. The patient has a blunted mood and affect. Stated that she did not have a good day because the food served in the cafeteria was horrible. Stated, " My mother better bring me some [employer]". Social with select peers in the milieu. Engaged in a group card game this evening. A. Encouraged to attend evening group. Discussed discharge plans. Reviewed and administered HS medication. R. The patient attended evening group. Stated that she was ready for discharge and hoped she would be discharged tomorrow. Stated that she has a therapist in [place] but that her Social Worker is helping her find a therapist in [place] so her mother doesn't have to drive so far. Compliant with medication.

## 2014-02-13 NOTE — Tx Team (Signed)
Date:02/13/2014  Progress in Treatment:  Attending groups: Yes  Participating in groups: Yes  Taking medication as prescribed: Yes  Tolerating medication: Yes  Family/Significant othe contact made: Patient mother has been involved in treatment Patient understands diagnosis: Yes, reporting she wants a closer MD as her family has trouble getting her to her appointments to get the help she needs Discussing patient identified problems/goals with staff: Limited Medical problems stabilized or resolved: Yes  Denies suicidal/homicidal ideation: Yes Patient has not harmed self or Others: Yes   New problem(s) identified: none  Discharge Plan or Barriers:  Patient to follow up outpatient.  Requesting a new MD as her current one is too far away.  Additional comments: n/a   Reason for Continuation of Hospitalization:  Aggression  Depression Hallucinations Homicidal ideation Mania Medication stabilization Suicidal ideation  Other; describe other   Estimated length of stay: DC 3/3. Patient has signed a 72 hour     For review of initial/current patient goals, please see plan of care.  Attendees:  Patient:    Family:    Physician: Dr.Akintayo, MD  02/13/2014 10:34 AM   Nursing: Sharl Ma RN 02/13/2014 10:34 AM   Clinical Social Worker Vidal Schwalbe, Marlinda Mike, MSW 02/13/2014 10:34 AM   Other: Mickel Baas, NP 02/13/2014 10:34 AM   Other:  02/13/2014 10:34 AM   Other:  02/13/2014 10:34 AM   Other: 02/13/2014 10:34 AM   Scribe for Treatment Team:  Vidal Schwalbe, Marlinda Mike, MSW   02/13/2014

## 2014-02-14 DIAGNOSIS — F3132 Bipolar disorder, current episode depressed, moderate: Principal | ICD-10-CM

## 2014-02-14 MED ORDER — CARBAMAZEPINE ER 400 MG PO TB12: 400.0000 mg | ORAL_TABLET | Freq: Two times a day (BID) | ORAL | Status: DC

## 2014-02-14 MED ORDER — CITALOPRAM HYDROBROMIDE 20 MG PO TABS: 20.0000 mg | ORAL_TABLET | Freq: Every day | ORAL | Status: DC

## 2014-02-14 MED ORDER — TRAZODONE HCL 300 MG PO TABS: 300.0000 mg | ORAL_TABLET | Freq: Every day | ORAL | Status: DC

## 2014-02-14 MED ORDER — ARIPIPRAZOLE 15 MG PO TABS: 15.0000 mg | ORAL_TABLET | Freq: Two times a day (BID) | ORAL | Status: DC

## 2014-02-14 NOTE — Progress Notes (Signed)
Discharge note: Pt received both written and verbal discharge instructions. Pt agreed to call and make f/u appt. Pt agreed to med regimen. Pt received all belongings room and locker. Pt denies SI/HI/AVH at time of discharge. Pt safely left [facility] with her mother.

## 2014-02-14 NOTE — [facility] Suicide Risk Assessment (Signed)
   Demographic Factors:  Female, Low socioeconomic status and Unemployed  Total Time spent with patient: 20 minutes  Psychiatric Specialty Exam: Physical Exam  Psychiatric: She has a normal mood and affect. Her speech is normal and behavior is normal. Judgment and thought content normal. Cognition and memory are normal.    Review of Systems  Constitutional: Negative.   HENT: Negative.   Eyes: Negative.   Respiratory: Negative.   Cardiovascular: Negative.   Gastrointestinal: Negative.   Genitourinary: Negative.   Musculoskeletal: Negative.   Skin: Negative.   Neurological: Negative.   Endo/Heme/Allergies: Negative.   Psychiatric/Behavioral: Negative.     Blood pressure 118/67, pulse 91, temperature 98.1 F (36.7 C), temperature source Oral, resp. rate 18, height 5\' 6"  (1.676 m), weight 120.203 kg (265 lb), last menstrual period 02/03/2014.Body mass index is 42.79 kg/(m^2).  General Appearance: Fairly Groomed  Engineer, water::  Good  Speech:  Clear and Coherent  Volume:  Normal  Mood:  Euthymic  Affect:  Appropriate  Thought Process:  Circumstantial  Orientation:  Full (Time, Place, and Person)  Thought Content:  Negative  Suicidal Thoughts:  No  Homicidal Thoughts:  No  Memory:  Immediate;   Fair Recent;   Fair Remote;   Fair  Judgement:  Fair  Insight:  Fair  Psychomotor Activity:  Normal  Concentration:  Fair  Recall:  [employer] [place]  Language: Fair  Akathisia:  No  Handed:  Right  AIMS (if indicated):     Assets:  Communication Skills Social Support  Sleep:  Number of Hours: 6    Musculoskeletal: Strength & Muscle Tone: within normal limits Gait & Station: normal Patient leans: N/A   Mental Status Per Nursing Assessment::   On Admission:  Self-harm thoughts  Current Mental Status by Physician: patient denies suicidal ideation, intent or plan  Loss Factors: Financial problems/change in socioeconomic status  Historical  Factors: Impulsivity  Risk Reduction Factors:   Sense of responsibility to family and Positive social support  Continued Clinical Symptoms:  Resolving mood symptoms  Cognitive Features That Contribute To Risk:  Polarized thinking    Suicide Risk:  Minimal: No identifiable suicidal ideation.  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 Asthma   . UTI (urinary tract infection)     Completed Cipro 08/06/12  . Gastritis    AXIS IV:  other psychosocial or environmental problems and problems related to social environment AXIS V:  61-70 mild symptoms  Plan Of Care/Follow-up recommendations:  Activity:  as tolerated Diet:  healthy Tests:  routine Other:  patient to keep her after care appointment  Is patient on multiple antipsychotic therapies at discharge:  No   Has Patient had three or more failed trials of antipsychotic monotherapy by history:  No  Recommended Plan for Multiple Antipsychotic Therapies: NA    Corena Pilgrim, MD 02/14/2014, 9:53 AM

## 2014-02-14 NOTE — Progress Notes (Signed)
[facility] Adult Case Management Discharge Plan :  Will you be returning to the same living situation after discharge: Yes,  home with mother  At discharge, do you have transportation home?:Yes,  mother will pick patient up at 2:00pm Do you have the ability to pay for your medications:Yes,  no barriers  Release of information consent forms completed and in the chart;  Patient's signature needed at discharge.  Patient to Follow up at: Follow-up Information   Follow up with [facility]. (Follow up for medicaitons with current provider in [place])    Contact information:   Erich Montane, PA [address] [place], [place] [address]      Follow up with [facility]. Schedule an appointment as soon as possible for a visit on 02/14/2014. Kenney Houseman was called several times for appointment, however no call returned. Mother and patient aware to call and follow up with Kenney Houseman.)    Contact information:   Reita Chard, Therapist [address] [place], [place] [address] [phone]      Patient denies SI/HI:   Yes,  no reports of SI    Safety Planning and Suicide Prevention discussed:  Yes,  see SI note.  [name] 02/14/2014, 10:00 AM

## 2014-02-14 NOTE — [facility] Group Notes (Signed)
[place] LCSW Group Therapy  02/14/2014 12:40 PM  Type of Therapy:  Group Therapy  Participation Level:  Minimal  Participation Quality:  Attentive  Affect:  Appropriate  Cognitive:  Limited  Insight:  Lacking  Engagement in Therapy:  Engaged  Modes of Intervention:  Clarification, Discussion, Exploration, Problem-solving and Support  Summary of Progress/Problems:  Group today consisted of members discussing feelings around their diagnosis.  Group discussed negative, positive, stigmas and feelings about labels and how they associate with mental health.  [name] remains having limited insight and processing skills. She was able to give one word answers when describing negative feelings associated with mental health, but could not show improved insight into overcoming her negative feelings around diagnosis.    [name] 02/14/2014, 12:40 PM

## 2014-02-14 NOTE — [facility] Suicide Risk Assessment (Signed)
[place] INPATIENT:  Family/Significant Other Suicide Prevention Education  Suicide Prevention Education:  Education Completed; [name],Nichole   has been identified by the patient as the family member/significant other with whom the patient will be residing, and identified as the person(s) who will aid the patient in the event of a mental health crisis (suicidal ideations/suicide attempt).  With written consent from the patient, the family member/significant other has been provided the following suicide prevention education, prior to the and/or following the discharge of the patient.  The suicide prevention education provided includes the following:  Suicide risk factors  Suicide prevention and interventions  National Suicide Hotline telephone number  [facility] assessment telephone number  [facility] and/or Residential Mobile Crisis Unit telephone number  Request made of family/significant other to:  Remove weapons (e.g., guns, rifles, knives), all items previously/currently identified as safety concern.    Remove drugs/medications (over-the-counter, prescriptions, illicit drugs), all items previously/currently identified as a safety concern.  The family member/significant other verbalizes understanding of the suicide prevention education information provided.  The family member/significant other agrees to remove the items of safety concern listed above.  [name] 02/14/2014, 8:39 AM
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
## 2014-02-17 NOTE — Progress Notes (Addendum)
Patient Discharge Instructions:  After Visit Summary (AVS):   Faxed to:  02/17/14 Discharge Summary Note:   Faxed to:  02/17/14 Psychiatric Admission Assessment Note:   Faxed to:  02/17/14 Suicide Risk Assessment - Discharge Assessment:   Faxed to:  02/17/14 Faxed/Sent to the Next Level Care provider:  02/17/14 Faxed to [facility] @ [phone] Faxed to [place] @ [phone] Patsey Berthold, 02/17/2014, 12:42 PM

## 2014-02-18 ENCOUNTER — Encounter (HOSPITAL_COMMUNITY): Payer: Self-pay | Admitting: Emergency Medicine

## 2014-02-18 ENCOUNTER — Emergency Department (HOSPITAL_COMMUNITY)
Admission: EM | Admit: 2014-02-18 | Discharge: 2014-02-18 | Disposition: A | Discharge status: Home / Self Care | Payer: Medicare HMO | Attending: Emergency Medicine | Admitting: Emergency Medicine

## 2014-02-18 DIAGNOSIS — Z8719 Personal history of other diseases of the digestive system: Secondary | ICD-10-CM | POA: Insufficient documentation

## 2014-02-18 DIAGNOSIS — Z79899 Other long term (current) drug therapy: Secondary | ICD-10-CM | POA: Insufficient documentation

## 2014-02-18 DIAGNOSIS — Z87440 Personal history of urinary (tract) infections: Secondary | ICD-10-CM | POA: Insufficient documentation

## 2014-02-18 DIAGNOSIS — F319 Bipolar disorder, unspecified: Secondary | ICD-10-CM | POA: Insufficient documentation

## 2014-02-18 DIAGNOSIS — F411 Generalized anxiety disorder: Secondary | ICD-10-CM | POA: Insufficient documentation

## 2014-02-18 DIAGNOSIS — J45909 Unspecified asthma, uncomplicated: Secondary | ICD-10-CM | POA: Insufficient documentation

## 2014-02-18 DIAGNOSIS — R079 Chest pain, unspecified: Secondary | ICD-10-CM

## 2014-02-18 MED ORDER — IBUPROFEN 800 MG PO TABS
800.0000 mg | ORAL_TABLET | Freq: Once | ORAL | Status: AC
  Administered 2014-02-18: 800 mg via ORAL
  Filled 2014-02-18: qty 1

## 2014-02-18 NOTE — ED Provider Notes (Signed)
CSN: [id number]     Arrival date & time 02/18/14  0541 History   First MD Initiated Contact with Patient 02/18/14 [phone]     Chief Complaint  Patient presents with  . Chest Pain     (Consider location/radiation/quality/duration/timing/severity/associated sxs/prior Treatment) HPI History provided by patient. History of bipolar disorder and asthma presents with left-sided chest pain onset about 4:30 AM when she woke up, able to point with one finger where hurts underneath her left breast. Pain lasted about an hour until she got here and now is completely gone. No trauma. No cough. No fevers or chills. Pain is worse with movement. No known alleviating factors. Did not take any medications for this. No history of same. No associated leg pain or leg swelling. No history of DVT or PE. Now in the ER without symptoms declines any blood work or chest x-ray.  Past Medical History  Diagnosis Date  . Bipolar disorder   . Attention deficit hyperactivity disorder   . Depression   . Anxiety   . Asthma   . UTI (urinary tract infection)     Completed Cipro 08/06/12  . Gastritis    Past Surgical History  Procedure Laterality Date  . Mouth surgery    . Eye muscle surgery Bilateral     07/29/12   Family History  Problem Relation Age of Onset  . Depression Mother   . Depression Father   . Depression Brother   . Colon cancer Mother   . Colon polyps Mother   . Liver disease    . Kidney disease     History  Substance Use Topics  . Smoking status: Never Smoker   . Smokeless tobacco: Never Used  . Alcohol Use: No   OB History   Grav Para Term Preterm Abortions TAB SAB Ect Mult Living   0 0             Review of Systems  Constitutional: Negative for fever and chills.  Respiratory: Negative for cough, shortness of breath and wheezing.   Cardiovascular: Positive for chest pain. Negative for palpitations and leg swelling.  Gastrointestinal: Negative for abdominal pain.  Genitourinary: Negative  for dysuria.  Musculoskeletal: Negative for back pain, neck pain and neck stiffness.  Skin: Negative for rash.  Neurological: Negative for headaches.  All other systems reviewed and are negative.      Allergies  Depakote; Methylphenidate derivatives; Neurontin; and Prozac  Home Medications   Current Outpatient Rx  Name  Route  Sig  Dispense  Refill  . ARIPiprazole (ABILIFY) 15 MG tablet   Oral   Take 1 tablet (15 mg total) by mouth 2 (two) times daily before a meal.   60 tablet   0   . carbamazepine (TEGRETOL XR) 400 MG 12 hr tablet   Oral   Take 1 tablet (400 mg total) by mouth 2 (two) times daily.   60 tablet   0   . citalopram (CELEXA) 20 MG tablet   Oral   Take 1 tablet (20 mg total) by mouth daily.   30 tablet   0   . traZODone (DESYREL) 300 MG tablet   Oral   Take 1 tablet (300 mg total) by mouth at bedtime.   30 tablet   0    LMP 02/03/2014 Physical Exam  Constitutional: She is oriented to person, place, and time. She appears well-developed and well-nourished.  HENT:  Head: Normocephalic and atraumatic.  Eyes: EOM are normal. Pupils are equal,  round, and reactive to light.  Neck: Neck supple.  Cardiovascular: Normal rate, regular rhythm and intact distal pulses.   Pulmonary/Chest: Effort normal and breath sounds normal. No respiratory distress. She exhibits no tenderness.  No rash. No crepitus.  Abdominal: Soft. She exhibits no distension. There is no tenderness. There is no rebound and no guarding.  Musculoskeletal: Normal range of motion. She exhibits no edema.  Neurological: She is alert and oriented to person, place, and time.  Skin: Skin is warm and dry.  Psychiatric: She has a normal mood and affect. Her behavior is normal.    ED Course  Procedures (including critical care time) Labs Review Labs Reviewed - No data to display Imaging Review No results found.   EKG Interpretation   Date/Time:  Saturday February 18 2014 05:52:09  EST Ventricular Rate:  90 PR Interval:  129 QRS Duration: 79 QT Interval:  366 QTC Calculation: 448 R Axis:   77 Text Interpretation:  Sinus rhythm Low voltage, precordial leads  Borderline T abnormalities, diffuse leads No significant change since last  tracing Confirmed by Charish Schroepfer  MD, Chevi Lim ([id number]) on 02/18/2014 6:07:50 AM      Motrin provided. Patient declines any blood work or imaging. Patient is adamant that she be discharged and was like followup with her primary care physician in the outpatient clinics.  MDM   Diagnosis: Chest pain  EKG reviewed Patient declines imaging or blood work  Given now asymptomatic doubt serious etiology for her symptoms, workup is although incomplete Previous records reviewed - vital signs and nursing notes reviewed and considered.   Teressa Lower, MD 02/18/14 [phone]

## 2014-02-18 NOTE — ED Notes (Signed)
I spoke with patients mother on the phone to confirm patient has been seen by the doctor and that she refused to go to xray. Pt was also provided a coke, by request.

## 2014-02-18 NOTE — ED Notes (Signed)
Pt. Having 6/10 left mid-axillary pain that started at 0430. Pt reports it becomes worse on deep respiration and with movement. Vital signs stable.

## 2014-02-18 NOTE — ED Notes (Signed)
Patient refused to go to xray. MD aware.

## 2014-02-18 NOTE — Discharge Instructions (Signed)
Chest Pain (Nonspecific) I recommend that you stay and have a chest x-ray and blood work to be evaluated for potentially serious causes for your symptoms . Return here anytime you change your mind, otherwise follow up with your doctor today as planned in the clinic.   It is often hard to give a specific diagnosis for the cause of chest pain. There is always a chance that your pain could be related to something serious, such as a heart attack or a blood clot in the lungs. You need to follow up with your caregiver for further evaluation. CAUSES   Heartburn.  Pneumonia or bronchitis.  Anxiety or stress.  Inflammation around your heart (pericarditis) or lung (pleuritis or pleurisy).  A blood clot in the lung.  A collapsed lung (pneumothorax). It can develop suddenly on its own (spontaneous pneumothorax) or from injury (trauma) to the chest.  Shingles infection (herpes zoster virus). The chest wall is composed of bones, muscles, and cartilage. Any of these can be the source of the pain.  The bones can be bruised by injury.  The muscles or cartilage can be strained by coughing or overwork.  The cartilage can be affected by inflammation and become sore (costochondritis). DIAGNOSIS  Lab tests or other studies, such as X-rays, electrocardiography, stress testing, or cardiac imaging, may be needed to find the cause of your pain.  TREATMENT   Treatment depends on what may be causing your chest pain. Treatment may include:  Acid blockers for heartburn.  Anti-inflammatory medicine.  Pain medicine for inflammatory conditions.  Antibiotics if an infection is present.  You may be advised to change lifestyle habits. This includes stopping smoking and avoiding alcohol, caffeine, and chocolate.  You may be advised to keep your head raised (elevated) when sleeping. This reduces the chance of acid going backward from your stomach into your esophagus.  Most of the time, nonspecific chest pain  will improve within 2 to 3 days with rest and mild pain medicine. HOME CARE INSTRUCTIONS   If antibiotics were prescribed, take your antibiotics as directed. Finish them even if you start to feel better.  For the next few days, avoid physical activities that bring on chest pain. Continue physical activities as directed.  Do not smoke.  Avoid drinking alcohol.  Only take over-the-counter or prescription medicine for pain, discomfort, or fever as directed by your caregiver.  Follow your caregiver's suggestions for further testing if your chest pain does not go away.  Keep any follow-up appointments you made. If you do not go to an appointment, you could develop lasting (chronic) problems with pain. If there is any problem keeping an appointment, you must call to reschedule. SEEK MEDICAL CARE IF:   You think you are having problems from the medicine you are taking. Read your medicine instructions carefully.  Your chest pain does not go away, even after treatment.  You develop a rash with blisters on your chest. SEEK IMMEDIATE MEDICAL CARE IF:   You have increased chest pain or pain that spreads to your arm, neck, jaw, back, or abdomen.  You develop shortness of breath, an increasing cough, or you are coughing up blood.  You have severe back or abdominal pain, feel nauseous, or vomit.  You develop severe weakness, fainting, or chills.  You have a fever. THIS IS AN EMERGENCY. Do not wait to see if the pain will go away. Get medical help at once. Call your local emergency services (911 in [place] Do not drive yourself  to the hospital. MAKE SURE YOU:   Understand these instructions.  Will watch your condition.  Will get help right away if you are not doing well or get worse. Document Released: 09/10/2005 Document Revised: 02/23/2012 Document Reviewed: 07/06/2008 [facility] Patient Information 2014 [place].

## 2014-03-02 ENCOUNTER — Emergency Department (HOSPITAL_COMMUNITY)
Admission: EM | Admit: 2014-03-02 | Discharge: 2014-03-02 | Discharge status: Against Medical Advice | Payer: Medicare HMO | Attending: Emergency Medicine | Admitting: Emergency Medicine

## 2014-03-02 DIAGNOSIS — J45909 Unspecified asthma, uncomplicated: Secondary | ICD-10-CM | POA: Insufficient documentation

## 2014-03-02 DIAGNOSIS — R079 Chest pain, unspecified: Secondary | ICD-10-CM | POA: Insufficient documentation

## 2014-03-02 DIAGNOSIS — J029 Acute pharyngitis, unspecified: Secondary | ICD-10-CM | POA: Insufficient documentation

## 2014-03-02 DIAGNOSIS — R059 Cough, unspecified: Secondary | ICD-10-CM | POA: Insufficient documentation

## 2014-03-02 DIAGNOSIS — R05 Cough: Secondary | ICD-10-CM | POA: Insufficient documentation

## 2014-03-02 NOTE — ED Notes (Signed)
Per EMS, pt from home.  Pt c/o n/v/d x 1 week.  No c/o fever.  C/o sore throat and cough also.  Pt arrived in lobby.  Pt is stating she wants to leave and [facility] to [facility] b/c she will be placed in lobby.  Vitals in route - 124/94, hr 76, 98%, resp 18 and 97.4 temp

## 2014-03-03 ENCOUNTER — Encounter: Payer: Self-pay | Admitting: Internal Medicine

## 2014-03-03 ENCOUNTER — Other Ambulatory Visit (HOSPITAL_COMMUNITY)
Admission: RE | Admit: 2014-03-03 | Discharge: 2014-03-03 | Disposition: A | Discharge status: Home / Self Care | Payer: Medicare HMO | Source: Ambulatory Visit | Attending: Internal Medicine | Admitting: Internal Medicine

## 2014-03-03 ENCOUNTER — Telehealth: Payer: Self-pay | Admitting: *Deleted

## 2014-03-03 ENCOUNTER — Ambulatory Visit (INDEPENDENT_AMBULATORY_CARE_PROVIDER_SITE_OTHER): Payer: Medicare HMO | Admitting: Internal Medicine

## 2014-03-03 VITALS — BP 117/83 | HR 72 | Temp 98.9°F | Ht 66.0 in | Wt 259.0 lb

## 2014-03-03 DIAGNOSIS — Z124 Encounter for screening for malignant neoplasm of cervix: Secondary | ICD-10-CM | POA: Insufficient documentation

## 2014-03-03 DIAGNOSIS — Z113 Encounter for screening for infections with a predominantly sexual mode of transmission: Secondary | ICD-10-CM | POA: Insufficient documentation

## 2014-03-03 DIAGNOSIS — N760 Acute vaginitis: Secondary | ICD-10-CM | POA: Insufficient documentation

## 2014-03-03 MED ORDER — FLUCONAZOLE 150 MG PO TABS: 150.0000 mg | ORAL_TABLET | Freq: Every day | ORAL | Status: DC

## 2014-03-03 NOTE — Telephone Encounter (Signed)
Pt calls and requests appt this am for an "infected area" on inner thigh. No appts available for this am. Offered only appt available today, 1400 dr schooler, complained but accepted, if an earlier appt opens up pt will be called and offered the appt.

## 2014-03-03 NOTE — Patient Instructions (Signed)
We will prescribe a pill that you can pick up at the pharmacy. If your symptoms do not improve by next week, call the clinic at [phone].  Please bring your medicines with you each time you come.   Medicines may be  Eye drops  Herbal   Vitamins  Pills  Seeing these help [place] take care of you.   Candidal Vulvovaginitis Candidal vulvovaginitis is an infection of the vagina and vulva. The vulva is the skin around the opening of the vagina. This may cause itching and discomfort in and around the vagina.  HOME CARE  Only take medicine as told by your doctor.  Do not have sex (intercourse) until the infection is healed or as told by your doctor.  Practice safe sex.  Tell your sex partner about your infection.  Do not douche or use tampons.  Wear cotton underwear. Do not wear tight pants or panty hose.  Eat yogurt. This may help treat and prevent yeast infections. GET HELP RIGHT AWAY IF:   You have a fever.  Your problems get worse during treatment or do not get better in 3 days.  You have discomfort, irritation, or itching in your vagina or vulva area.  You have pain after sex.  You start to get belly (abdominal) pain. MAKE SURE YOU:  Understand these instructions.  Will watch your condition.  Will get help right away if you are not doing well or get worse. Document Released: 02/27/2009 Document Revised: 02/23/2012 Document Reviewed: 02/27/2009 [facility] Patient Information 2014 [place], [place].

## 2014-03-03 NOTE — Telephone Encounter (Signed)
Agree with appt. 

## 2014-03-03 NOTE — Progress Notes (Signed)
   Subjective:    Patient ID: [name], female    DOB: [birth year], 28 y.o.   MRN: [id number]  HPI  Pt is a 28 yo with hx significant for depressed bipolar d/o and borderline d/o followed by [facility], presents with c/o right thigh boil. Mother present initially but asked to leave by daughter.  After departure of mother pt reports that she is actually having irritation of the "lips" of her vagina and her inner thigh areas. She has not been wearing underwear and furthermore states that she is on her period today and wore tampon without underwear.  Endorses recent unprotected sex.  Denies discharge, dyuria, odor, or new bumps or lesions to the area.   Review of Systems  Constitutional: Negative for fever and fatigue.  HENT: Negative.   Eyes: Negative.   Respiratory: Negative.   Cardiovascular: Negative.   Endocrine: Negative.   Genitourinary: Positive for vaginal pain and menstrual problem. Negative for dysuria, hematuria, vaginal discharge and genital sores.       Irreg periods secondary to [place]  Musculoskeletal: Negative.   Skin: Positive for rash.       Rash on inner thighs  Allergic/Immunologic: Negative.   Neurological: Negative.   Psychiatric/Behavioral: Negative for dysphoric mood.       Denies depressive symptoms today       Objective:   Physical Exam  Constitutional: She is oriented to person, place, and time. She appears well-developed and well-nourished. No distress.  HENT:  Head: Normocephalic and atraumatic.  Eyes: Conjunctivae and EOM are normal. Pupils are equal, round, and reactive to light.  Cardiovascular: Normal rate, regular rhythm and normal heart sounds.   Pulmonary/Chest: Effort normal and breath sounds normal.  Abdominal: Soft. Bowel sounds are normal. She exhibits no distension. There is no tenderness.  Genitourinary: Uterus normal.    There is rash on the right labia. There is rash on the left labia. Cervix exhibits discharge. Cervix  exhibits no friability. Right adnexum displays no mass, no tenderness and no fullness. Left adnexum displays no mass, no tenderness and no fullness. Vaginal discharge found.  Scant white discharge mixed with small amount of blood from cervical os, pt on menses  Musculoskeletal: She exhibits no edema and no tenderness.  Lymphadenopathy:       Right: No inguinal adenopathy present.       Left: No inguinal adenopathy present.  Neurological: She is alert and oriented to person, place, and time.  Skin: Skin is warm and dry.  Psychiatric: Her speech is normal. Thought content normal. Her affect is blunt.    Erythematous vulvar and inner thighs      Assessment & Plan:  See separate problem-list charting for full details:  1. Vulvocandidiasis: fluconazole 150 mg x1 -declines HIV today -send GC/Chlamydia, Trich,  Wet prep

## 2014-03-06 LAB — CERVICOVAGINAL ANCILLARY ONLY
Chlamydia: NEGATIVE
Neisseria Gonorrhea: NEGATIVE
Wet Prep (BD Affirm): NEGATIVE
Wet Prep (BD Affirm): NEGATIVE
Wet Prep (BD Affirm): NEGATIVE

## 2014-03-07 ENCOUNTER — Telehealth: Payer: Self-pay | Admitting: *Deleted

## 2014-03-07 NOTE — Assessment & Plan Note (Signed)
Clinical exam c/w candidal infection.  Encouraged to wear cotton underwear as it appears vaginal discharge is getting in contact with her inner thighs thus contributing to a larger irritated area.  -fluconazole 150 mg x1

## 2014-03-07 NOTE — Assessment & Plan Note (Signed)
Pap Smear today.

## 2014-03-07 NOTE — Telephone Encounter (Signed)
Message copied by Ebbie Latus on Tue Mar 07, 2014  8:49 AM ------      Message from: Jeralene Huff      Created: Mon Mar 06, 2014  3:45 PM       Please contact [name] and let her know that her STD tests were negative.            Thanks,      Santiago Glad schooler ------

## 2014-03-07 NOTE — Telephone Encounter (Signed)
Called pt -pt identified herself via stating her correct birthday. Informed her STD tests were negative per Dr Michail Sermon. Request test result to be mailed to her; does not have my chart. OK w/Dr Michail Sermon - I will maill results.

## 2014-03-08 NOTE — Progress Notes (Signed)
Case discussed with Dr. Schooler soon after the resident saw the patient.  We reviewed the resident's history and exam and pertinent patient test results.  I agree with the assessment, diagnosis, and plan of care documented in the resident's note. 

## 2014-03-12 ENCOUNTER — Emergency Department (INDEPENDENT_AMBULATORY_CARE_PROVIDER_SITE_OTHER)
Admission: EM | Admit: 2014-03-12 | Discharge: 2014-03-12 | Disposition: A | Discharge status: Home / Self Care | Payer: Commercial Managed Care - HMO | Source: Home / Self Care | Attending: Family Medicine | Admitting: Family Medicine

## 2014-03-12 ENCOUNTER — Encounter (HOSPITAL_COMMUNITY): Payer: Self-pay | Admitting: Emergency Medicine

## 2014-03-12 DIAGNOSIS — R079 Chest pain, unspecified: Secondary | ICD-10-CM

## 2014-03-12 NOTE — ED Notes (Signed)
Patient complains of chest pain since this am

## 2014-03-12 NOTE — ED Provider Notes (Signed)
[name] is a 28 y.o. female who presents to Urgent Care today for chest pain. Patient has experienced about 4 hours of central chest pain starting today. The pain is located centrally and worse with deep inspiration. She denies any shortness of breath or palpitations. She denies any exertional pain or radiating pain. She has not tried any medications yet. She suspects her pain may be related to reflux and picked up her proton on the way to the urgent care for evaluation. She was evaluated in the emergency room for similar complaint on March 7 however patient left prior to x-rays or blood work to evaluate for pneumonia, mass, or PE/DVT. Patient currently does not have any leg swelling. She feels well otherwise.   Past Medical History  Diagnosis Date  . Bipolar disorder   . Attention deficit hyperactivity disorder   . Depression   . Anxiety   . Asthma   . UTI (urinary tract infection)     Completed Cipro 08/06/12  . Gastritis    History  Substance Use Topics  . Smoking status: Never Smoker   . Smokeless tobacco: Never Used  . Alcohol Use: No   ROS as above Medications: No current facility-administered medications for this encounter.   Current Outpatient Prescriptions  Medication Sig Dispense Refill  . ARIPiprazole (ABILIFY) 15 MG tablet Take 1 tablet (15 mg total) by mouth 2 (two) times daily before a meal.  60 tablet  0  . carbamazepine (TEGRETOL XR) 400 MG 12 hr tablet Take 1 tablet (400 mg total) by mouth 2 (two) times daily.  60 tablet  0  . citalopram (CELEXA) 20 MG tablet Take 1 tablet (20 mg total) by mouth daily.  30 tablet  0  . fluconazole (DIFLUCAN) 150 MG tablet Take 1 tablet (150 mg total) by mouth daily.  1 tablet  0  . traZODone (DESYREL) 300 MG tablet Take 1 tablet (300 mg total) by mouth at bedtime.  30 tablet  0    Exam:  BP 134/87  Pulse 92  Temp(Src) 98.5 F (36.9 C) (Oral)  Resp 18  SpO2 95%  LMP 02/03/2014 Gen: Well NAD HEENT: EOMI,  MMM Lungs:  Normal work of breathing. CTABL Heart: RRR no MRG Chest wall: Nontender Abd: NABS, Soft. NT, ND Exts: Brisk capillary refill, warm and well perfused. No swelling lower extremities  Twelve-lead EKG: Normal sinus rhythm at 83 beats per min. no ST segment elevations or depressions. Unchanged from prior EKG   Assessment and Plan: 28 y.o. female with chest pain. Unclear etiology. Patient refuses workup. Likely GERD. PE, pleuritis, other series etiology is a possibility. Patient left AMA. She expressed understanding and agreement that there is some considerable risk for needing AMA including death or disability. She states that she'll start taking her Protonix and followup with her doctor tomorrow.  Discussed warning signs or symptoms. Please see discharge instructions. Patient expresses understanding.    Gregor Hams, MD 03/12/14 [phone]

## 2014-03-12 NOTE — Discharge Instructions (Signed)
Thank you for coming in today. You are leaving against my medical opinion.  You are risking death or disability.  If you worsen go to the ER.  Please see your doctor soon.  Call or go to the emergency room if you get worse, have trouble breathing, have chest pains, or palpitations.    Chest Pain (Nonspecific) It is often hard to give a specific diagnosis for the cause of chest pain. There is always a chance that your pain could be related to something serious, such as a heart attack or a blood clot in the lungs. You need to follow up with your caregiver for further evaluation. CAUSES   Heartburn.  Pneumonia or bronchitis.  Anxiety or stress.  Inflammation around your heart (pericarditis) or lung (pleuritis or pleurisy).  A blood clot in the lung.  A collapsed lung (pneumothorax). It can develop suddenly on its own (spontaneous pneumothorax) or from injury (trauma) to the chest.  Shingles infection (herpes zoster virus). The chest wall is composed of bones, muscles, and cartilage. Any of these can be the source of the pain.  The bones can be bruised by injury.  The muscles or cartilage can be strained by coughing or overwork.  The cartilage can be affected by inflammation and become sore (costochondritis). DIAGNOSIS  Lab tests or other studies, such as X-rays, electrocardiography, stress testing, or cardiac imaging, may be needed to find the cause of your pain.  TREATMENT   Treatment depends on what may be causing your chest pain. Treatment may include:  Acid blockers for heartburn.  Anti-inflammatory medicine.  Pain medicine for inflammatory conditions.  Antibiotics if an infection is present.  You may be advised to change lifestyle habits. This includes stopping smoking and avoiding alcohol, caffeine, and chocolate.  You may be advised to keep your head raised (elevated) when sleeping. This reduces the chance of acid going backward from your stomach into your  esophagus.  Most of the time, nonspecific chest pain will improve within 2 to 3 days with rest and mild pain medicine. HOME CARE INSTRUCTIONS   If antibiotics were prescribed, take your antibiotics as directed. Finish them even if you start to feel better.  For the next few days, avoid physical activities that bring on chest pain. Continue physical activities as directed.  Do not smoke.  Avoid drinking alcohol.  Only take over-the-counter or prescription medicine for pain, discomfort, or fever as directed by your caregiver.  Follow your caregiver's suggestions for further testing if your chest pain does not go away.  Keep any follow-up appointments you made. If you do not go to an appointment, you could develop lasting (chronic) problems with pain. If there is any problem keeping an appointment, you must call to reschedule. SEEK MEDICAL CARE IF:   You think you are having problems from the medicine you are taking. Read your medicine instructions carefully.  Your chest pain does not go away, even after treatment.  You develop a rash with blisters on your chest. SEEK IMMEDIATE MEDICAL CARE IF:   You have increased chest pain or pain that spreads to your arm, neck, jaw, back, or abdomen.  You develop shortness of breath, an increasing cough, or you are coughing up blood.  You have severe back or abdominal pain, feel nauseous, or vomit.  You develop severe weakness, fainting, or chills.  You have a fever. THIS IS AN EMERGENCY. Do not wait to see if the pain will go away. Get medical help at once.  Call your local emergency services (911 in [place] Do not drive yourself to the hospital. MAKE SURE YOU:   Understand these instructions.  Will watch your condition.  Will get help right away if you are not doing well or get worse. Document Released: 09/10/2005 Document Revised: 02/23/2012 Document Reviewed: 07/06/2008 [facility] Patient Information 2014 [place].

## 2014-03-27 ENCOUNTER — Encounter (HOSPITAL_COMMUNITY): Payer: Self-pay | Admitting: Emergency Medicine

## 2014-03-27 ENCOUNTER — Emergency Department (HOSPITAL_COMMUNITY)
Admission: EM | Admit: 2014-03-27 | Discharge: 2014-03-28 | Disposition: A | Discharge status: Home / Self Care | Payer: Medicare HMO | Attending: Emergency Medicine | Admitting: Emergency Medicine

## 2014-03-27 DIAGNOSIS — J45909 Unspecified asthma, uncomplicated: Secondary | ICD-10-CM | POA: Insufficient documentation

## 2014-03-27 DIAGNOSIS — R45851 Suicidal ideations: Secondary | ICD-10-CM | POA: Insufficient documentation

## 2014-03-27 DIAGNOSIS — F319 Bipolar disorder, unspecified: Secondary | ICD-10-CM | POA: Insufficient documentation

## 2014-03-27 DIAGNOSIS — F411 Generalized anxiety disorder: Secondary | ICD-10-CM | POA: Insufficient documentation

## 2014-03-27 DIAGNOSIS — Z8719 Personal history of other diseases of the digestive system: Secondary | ICD-10-CM | POA: Insufficient documentation

## 2014-03-27 DIAGNOSIS — F4325 Adjustment disorder with mixed disturbance of emotions and conduct: Secondary | ICD-10-CM | POA: Insufficient documentation

## 2014-03-27 DIAGNOSIS — Z79899 Other long term (current) drug therapy: Secondary | ICD-10-CM | POA: Insufficient documentation

## 2014-03-27 DIAGNOSIS — Z791 Long term (current) use of non-steroidal anti-inflammatories (NSAID): Secondary | ICD-10-CM | POA: Insufficient documentation

## 2014-03-27 DIAGNOSIS — Z87440 Personal history of urinary (tract) infections: Secondary | ICD-10-CM | POA: Insufficient documentation

## 2014-03-27 DIAGNOSIS — F1510 Other stimulant abuse, uncomplicated: Secondary | ICD-10-CM | POA: Insufficient documentation

## 2014-03-27 LAB — COMPREHENSIVE METABOLIC PANEL
ALT: 22 U/L (ref 0–35)
AST: 17 U/L (ref 0–37)
Albumin: 3.9 g/dL (ref 3.5–5.2)
Alkaline Phosphatase: 99 U/L (ref 39–117)
BUN: 8 mg/dL (ref 6–23)
CO2: 22 mEq/L (ref 19–32)
Calcium: 9.2 mg/dL (ref 8.4–10.5)
Chloride: 103 mEq/L (ref 96–112)
Creatinine, Ser: 0.74 mg/dL (ref 0.50–1.10)
GFR calc Af Amer: >90 mL/min (ref >90)
GFR calc non Af Amer: >90 mL/min (ref >90)
Glucose, Bld: 104 mg/dL — ABNORMAL HIGH (ref 70–99)
Potassium: 3.9 mEq/L (ref 3.7–5.3)
Sodium: 140 mEq/L (ref 137–147)
Total Bilirubin: <0.2 mg/dL — ABNORMAL LOW (ref 0.3–1.2)
Total Protein: 7.2 g/dL (ref 6.0–8.3)

## 2014-03-27 LAB — RAPID URINE DRUG SCREEN, HOSP PERFORMED
Amphetamines: POSITIVE — AB
Barbiturates: NOT DETECTED
Benzodiazepines: NOT DETECTED
Cocaine: NOT DETECTED
Opiates: NOT DETECTED
Tetrahydrocannabinol: NOT DETECTED

## 2014-03-27 LAB — CBC WITH DIFFERENTIAL/PLATELET
Basophils Absolute: 0 10*3/uL (ref 0.0–0.1)
Basophils Relative: 0 % (ref 0–1)
Eosinophils Absolute: 0 10*3/uL (ref 0.0–0.7)
Eosinophils Relative: 0 % (ref 0–5)
HCT: 42.1 % (ref 36.0–46.0)
Hemoglobin: 14.2 g/dL (ref 12.0–15.0)
Lymphocytes Relative: 33 % (ref 12–46)
Lymphs Abs: 2.1 10*3/uL (ref 0.7–4.0)
MCH: 31.1 pg (ref 26.0–34.0)
MCHC: 33.7 g/dL (ref 30.0–36.0)
MCV: 92.3 fL (ref 78.0–100.0)
Monocytes Absolute: 0.3 10*3/uL (ref 0.1–1.0)
Monocytes Relative: 5 % (ref 3–12)
Neutro Abs: 4 10*3/uL (ref 1.7–7.7)
Neutrophils Relative %: 62 % (ref 43–77)
Platelets: 186 10*3/uL (ref 150–400)
RBC: 4.56 MIL/uL (ref 3.87–5.11)
RDW: 12 % (ref 11.5–15.5)
WBC: 6.4 10*3/uL (ref 4.0–10.5)

## 2014-03-27 LAB — CARBAMAZEPINE LEVEL, TOTAL: Carbamazepine Lvl: 8.4 ug/mL (ref 4.0–12.0)

## 2014-03-27 LAB — ACETAMINOPHEN LEVEL: Acetaminophen (Tylenol), Serum: <15 ug/mL (ref 10–30)

## 2014-03-27 LAB — SALICYLATE LEVEL: Salicylate Lvl: <2 mg/dL — ABNORMAL LOW (ref 2.8–20.0)

## 2014-03-27 LAB — ETHANOL: Alcohol, Ethyl (B): <11 mg/dL (ref 0–11)

## 2014-03-27 MED ORDER — FAMOTIDINE 20 MG PO TABS
40.0000 mg | ORAL_TABLET | Freq: Two times a day (BID) | ORAL | Status: DC
  Administered 2014-03-27 – 2014-03-28 (×2): 40 mg via ORAL
  Filled 2014-03-27 (×2): qty 2

## 2014-03-27 MED ORDER — SODIUM CHLORIDE 0.9 % IV BOLUS (SEPSIS)
1000.0000 mL | Freq: Once | INTRAVENOUS | Status: AC
  Administered 2014-03-27: 1000 mL via INTRAVENOUS

## 2014-03-27 MED ORDER — ALUM & MAG HYDROXIDE-SIMETH 200-200-20 MG/5ML PO SUSP: 30.0000 mL | ORAL | Status: DC | PRN

## 2014-03-27 MED ORDER — ONDANSETRON HCL 4 MG PO TABS
4.0000 mg | ORAL_TABLET | Freq: Three times a day (TID) | ORAL | Status: DC | PRN
  Administered 2014-03-27: 4 mg via ORAL
  Filled 2014-03-27: qty 1

## 2014-03-27 MED ORDER — ACETAMINOPHEN 325 MG PO TABS: 650.0000 mg | ORAL_TABLET | ORAL | Status: DC | PRN

## 2014-03-27 MED ORDER — GI COCKTAIL ~~LOC~~
30.0000 mL | Freq: Once | ORAL | Status: AC
  Administered 2014-03-27: 30 mL via ORAL
  Filled 2014-03-27: qty 30

## 2014-03-27 MED ORDER — ZOLPIDEM TARTRATE 5 MG PO TABS: 5.0000 mg | ORAL_TABLET | Freq: Every evening | ORAL | Status: DC | PRN

## 2014-03-27 NOTE — ED Notes (Signed)
Patient was brought in by her mother and reported that the patient took an overdose of Ibuprofen. Patient after much encouragement stated that she took 2 handfuls. Patient admits to trying to kill herself. Patient was at [facility]  last week for suicidal thoughts. Patient denies having HI, auditory or visual hallucinations.

## 2014-03-27 NOTE — ED Notes (Signed)
Patient family has her belongings. 

## 2014-03-27 NOTE — ED Notes (Signed)
Patient's mother counted Ibuprofen tablets left in a new bottle of 200 ct. And there were 131 left. 69 Ibuprofen tablets missing per mother.

## 2014-03-27 NOTE — ED Notes (Signed)
[place]-Poison Control notified of overdose of Ibuprofen 200 mg x 69 tablets. Suggestions-Observe x 6 hours, Iv hydration, GI cocktail, Acetaminophen level, CMP.

## 2014-03-27 NOTE — ED Notes (Signed)
Poison Control returned call:  Updated on pt's condition; was advised that pt has been medically cleared.

## 2014-03-27 NOTE — ED Notes (Signed)
Bed: ZD63 Expected date:  Expected time:  Means of arrival:  Comments: Hold for fm

## 2014-03-27 NOTE — ED Provider Notes (Signed)
CSN: [id number]     Arrival date & time 03/27/14  1553 History   First MD Initiated Contact with Patient 03/27/14 1540     Chief Complaint  Patient presents with  . Medical Clearance  . Drug Overdose    Ibuprofen 2 handfuls     (Consider location/radiation/quality/duration/timing/severity/associated sxs/prior Treatment) HPI Comments: Pt states she was d/ced from [facility] last week and taken off risperdal but sinct that time she has not been any better with frequent mood swings, crying one min and throwing things the next.  She states she has been miserable.  Mother states that she can't handle her and she has been very sad.  She saw her psychiatrist today and told to increase her celexa to 40 and start safris but they are waiting for insurance approval.  Pt states that she was so miserable and depressed that she took 52 - 200mg  ibuprofen pills 2 hours pta.  Patient is a 28 y.o. female presenting with Overdose. The history is provided by the patient.  Drug Overdose This is a new problem. The current episode started 1 to 2 hours ago. The problem occurs constantly. The problem has not changed since onset.Pertinent negatives include no chest pain, no abdominal pain and no shortness of breath. Associated symptoms comments: No N/V. Nothing aggravates the symptoms. Nothing relieves the symptoms. She has tried nothing for the symptoms.    Past Medical History  Diagnosis Date  . Bipolar disorder   . Attention deficit hyperactivity disorder   . Depression   . Anxiety   . Asthma   . UTI (urinary tract infection)     Completed Cipro 08/06/12  . Gastritis    Past Surgical History  Procedure Laterality Date  . Mouth surgery    . Eye muscle surgery Bilateral     07/29/12   Family History  Problem Relation Age of Onset  . Depression Mother   . Depression Father   . Depression Brother   . Colon cancer Mother   . Colon polyps Mother   . Liver disease    . Kidney disease      History  Substance Use Topics  . Smoking status: Never Smoker   . Smokeless tobacco: Never Used  . Alcohol Use: No   OB History   Grav Para Term Preterm Abortions TAB SAB Ect Mult Living   0 0             Review of Systems  Respiratory: Negative for shortness of breath.   Cardiovascular: Negative for chest pain.  Gastrointestinal: Negative for nausea, vomiting and abdominal pain.  Genitourinary:       Currently on menses  Psychiatric/Behavioral: Positive for suicidal ideas.  All other systems reviewed and are negative.     Allergies  Depakote; Methylphenidate derivatives; Neurontin; and Prozac  Home Medications   Current Outpatient Rx  Name  Route  Sig  Dispense  Refill  . albuterol (PROVENTIL HFA;VENTOLIN HFA) 108 (90 BASE) MCG/ACT inhaler   Inhalation   Inhale 2 puffs into the lungs every 6 (six) hours as needed for wheezing or shortness of breath.         . carbamazepine (TEGRETOL XR) 400 MG 12 hr tablet   Oral   Take 1 tablet (400 mg total) by mouth 2 (two) times daily.   60 tablet   0   . citalopram (CELEXA) 20 MG tablet   Oral   Take 1 tablet (20 mg total) by mouth daily.  30 tablet   0   . citalopram (CELEXA) 40 MG tablet   Oral   Take 40 mg by mouth daily. To stop the 20 mg celexa after today 03/27/14         . ibuprofen (ADVIL,MOTRIN) 200 MG tablet   Oral   Take 13,800 mg by mouth once.         [name] LORazepam (ATIVAN) 0.5 MG tablet   Oral   Take 0.5 mg by mouth 3 (three) times daily.         . traZODone (DESYREL) 300 MG tablet   Oral   Take 1 tablet (300 mg total) by mouth at bedtime.   30 tablet   0    BP 109/90  Pulse 88  Temp(Src) 99.4 F (37.4 C) (Oral)  Resp 18  Ht 5\' 8"  (1.727 m)  SpO2 99%  LMP 03/27/2014 Physical Exam  Nursing note and vitals reviewed. Constitutional: She is oriented to person, place, and time. She appears well-developed and well-nourished. No distress.  HENT:  Head: Normocephalic and atraumatic.   Mouth/Throat: Oropharynx is clear and moist.  Eyes: Conjunctivae and EOM are normal. Pupils are equal, round, and reactive to light.  Neck: Normal range of motion. Neck supple.  Cardiovascular: Normal rate, regular rhythm and intact distal pulses.   No murmur heard. Pulmonary/Chest: Effort normal and breath sounds normal. No respiratory distress. She has no wheezes. She has no rales.  Abdominal: Soft. She exhibits no distension. There is no tenderness. There is no rebound and no guarding.  Musculoskeletal: Normal range of motion. She exhibits no edema and no tenderness.  Neurological: She is alert and oriented to person, place, and time.  Skin: Skin is warm and dry. No rash noted. No erythema.  Psychiatric: Her speech is normal and behavior is normal. She is not actively hallucinating. Thought content is not paranoid. She expresses impulsivity. She exhibits a depressed mood. She expresses suicidal ideation. She expresses no homicidal ideation. She expresses suicidal plans.  disheveled    ED Course  Procedures (including critical care time) Labs Review Labs Reviewed  COMPREHENSIVE METABOLIC PANEL - Abnormal; Notable for the following:    Glucose, Bld 104 (*)    Total Bilirubin <0.2 (*)    All other components within normal limits  URINE RAPID DRUG SCREEN (HOSP PERFORMED) - Abnormal; Notable for the following:    Amphetamines POSITIVE (*)    All other components within normal limits  CBC WITH DIFFERENTIAL  ETHANOL  CARBAMAZEPINE LEVEL, TOTAL  SALICYLATE LEVEL  ACETAMINOPHEN LEVEL   Imaging Review No results found.   EKG Interpretation None      MDM   Final diagnoses:  None   Patient with a long psychiatric history has recently been taken off of Risperdal and saw her psychiatrist today and was prescribed a new medication (safris) that she's not started who had a suicidal attempt today by taking 69 tablets of 200 mg ibuprofen approximately 2 hours prior to arrival.  Patient  states she feels so bad she just wants to die. She is manic then depressed. She is crying and then she is throwing things. Mother is at bedside and corroborates her story. She was recently admitted to [facility] and discharged several days ago. Patient has no underlying medical problems. Spoke with poison control who recommended observation for 6 hours, IV hydration, GI cocktail and medical clearance labs. Also will check a Tegretol level as patient is taking that medication.  She currently is  denying nausea, vomiting or epigastric pain.  11:55 PM Labs normal pt with some epigastric tenderness but no vomiting.  Cleared for psych    Blanchie Dessert, MD 03/28/14 [phone]

## 2014-03-28 DIAGNOSIS — F4325 Adjustment disorder with mixed disturbance of emotions and conduct: Secondary | ICD-10-CM

## 2014-03-28 DIAGNOSIS — T39314A Poisoning by propionic acid derivatives, undetermined, initial encounter: Secondary | ICD-10-CM

## 2014-03-28 DIAGNOSIS — F39 Unspecified mood [affective] disorder: Secondary | ICD-10-CM

## 2014-03-28 NOTE — Consult Note (Signed)
  Review of Systems  Constitutional: Negative.   HENT: Negative.   Eyes: Negative.   Respiratory: Negative.   Cardiovascular: Negative.   Gastrointestinal: Negative.   Genitourinary: Negative.   Musculoskeletal: Negative.   Skin: Negative.   Neurological: Negative.   Endo/Heme/Allergies: Negative.   Psychiatric/Behavioral: Negative.    Moodiness and irritability are the main issues she says

## 2014-03-28 NOTE — [facility] Suicide Risk Assessment (Signed)
Suicide Risk Assessment  Discharge Assessment     Demographic Factors:  Adolescent or young adult  Total Time spent with patient: 45 minutes  Psychiatric Specialty Exam:     Blood pressure 103/73, pulse 68, temperature 97.9 F (36.6 C), temperature source Oral, resp. rate 18, height 5\' 8"  (1.727 m), last menstrual period 03/27/2014, SpO2 100.00%.There is no weight on file to calculate BMI.  General Appearance: Casual  Eye Contact::  Good  Speech:  Clear and Coherent  Volume:  Normal  Mood:  Euthymic  Affect:  Appropriate  Thought Process:  Coherent  Orientation:  Full (Time, Place, and Person)  Thought Content:  Negative  Suicidal Thoughts:  No  Homicidal Thoughts:  No  Memory:  Immediate;   Good Recent;   Good Remote;   Good  Judgement:  Intact  Insight:  Present  Psychomotor Activity:  Normal  Concentration:  Good  Recall:  Good  Fund of Knowledge:Good  Language: Good  Akathisia:  Negative  Handed:  Right  AIMS (if indicated):     Assets:  Communication Skills Desire for Improvement Financial Resources/Insurance Housing Physical Health Social Support Transportation  Sleep:       Musculoskeletal: Strength & Muscle Tone: within normal limits Gait & Station: normal Patient leans: N/A   Mental Status Per Nursing Assessment::   On Admission:     Current Mental Status by Physician: NA  Loss Factors: NA  Historical Factors: Prior suicide attempts  Risk Reduction Factors:   Sense of responsibility to family, Living with another person, especially a relative and Positive social support  Continued Clinical Symptoms:  Bipolar Disorder:   Bipolar II  Cognitive Features That Contribute To Risk:  Closed-mindedness    Suicide Risk:  Minimal: No identifiable suicidal ideation.  Patients presenting with no risk factors but with morbid ruminations; may be classified as minimal risk based on the severity of the depressive symptoms  Discharge Diagnoses:    AXIS I:  Adjustment Disorder with Mixed Disturbance of Emotions and Conduct AXIS II:  Deferred AXIS III:   Past Medical History  Diagnosis Date  . Bipolar disorder   . Attention deficit hyperactivity disorder   . Depression   . Anxiety   . Asthma   . UTI (urinary tract infection)     Completed Cipro 08/06/12  . Gastritis    AXIS IV:  problems with access to health care services AXIS V:  61-70 mild symptoms  Plan Of Care/Follow-up recommendations:  Activity:  resume usual activity Diet:  resume usual diet  Is patient on multiple antipsychotic therapies at discharge:  No   Has Patient had three or more failed trials of antipsychotic monotherapy by history:  No  Recommended Plan for Multiple Antipsychotic Therapies: NA    Clarene Reamer 03/28/2014, 11:26 AM

## 2014-03-28 NOTE — Consult Note (Signed)
[facility] Face-to-Face Psychiatry Consult   Reason for Consult:  Took overdose of ibuprophen  Referring Physician:  ER MD  [name] is an 28 y.o. female. Total Time spent with patient: 45 minutes  Assessment: AXIS I:  Adjustment Disorder with Mixed Disturbance of Emotions and Conduct, Mood disorder, NIOS AXIS II:  Deferred AXIS III:   Past Medical History  Diagnosis Date  . Bipolar disorder   . Attention deficit hyperactivity disorder   . Depression   . Anxiety   . Asthma   . UTI (urinary tract infection)     Completed Cipro 08/06/12  . Gastritis    AXIS IV:  problems related to chronic mood disorder and conduct issues AXIS V:  61-70 mild symptoms  Plan:  No evidence of imminent risk to self or others at present.    Subjective:   [name] is a 28 y.o. female patient admitted with impulsive taking a handful of ibuprophen.  HPI:  [name] says she did impulsively take a handful of pills.  Denies any suicidal ideation or intent.  Believes that a recent Med change to risperidone and then off it has thrown her impulse control/moods off.  Says she has not been feeling depressed and wants to go home. She was just in the hospital last week for suicidal thoughts and does not need to go back, she says.  I talked to her mother who said she has been irritable and impulsive for several weeks.  They are looking for a new psychiatrist.  She says Saydi will say what we want to hear and then be oppositional as soon as she steps out of the door.  No apparent effects from the overdose. HPI Elements:   Location:  impulsive overdose. Quality:  not suicidal. Severity:  took a handful of pills. Timing:  no precipitant other than med changes. Duration:  several weeks. Context:  med change.  Past Psychiatric History: Past Medical History  Diagnosis Date  . Bipolar disorder   . Attention deficit hyperactivity disorder   . Depression   . Anxiety   . Asthma   . UTI (urinary tract  infection)     Completed Cipro 08/06/12  . Gastritis     reports that she has never smoked. She has never used smokeless tobacco. She reports that she does not drink alcohol or use illicit drugs. Family History  Problem Relation Age of Onset  . Depression Mother   . Depression Father   . Depression Brother   . Colon cancer Mother   . Colon polyps Mother   . Liver disease    . Kidney disease             Allergies:   Allergies  Allergen Reactions  . Depakote [Divalproex Sodium]     Pt had h/o intolerance from Depakote. Stated that she never wants this med."Blood level was toxic"  . Methylphenidate Derivatives     Gets depressed and lots of anger  . Neurontin [Gabapentin]     Pt reports extreme dizziness  . Prozac [Fluoxetine Hcl] Other (See Comments)    Reaction: anger    ACT Assessment Complete:  Yes:    Educational Status    Risk to Self: Risk to self Is patient at risk for suicide?: Yes Substance abuse history and/or treatment for substance abuse?: No  Risk to Others:    Abuse:    Prior Inpatient Therapy:    Prior Outpatient Therapy:    Additional Information:  Objective: Blood pressure 103/73, pulse 68, temperature 97.9 F (36.6 C), temperature source Oral, resp. rate 18, height _0  (1.727 m), last menstrual period 03/27/2014, SpO2 100.00%.There is no weight on file to calculate BMI. Results for orders placed during the hospital encounter of 03/27/14 (from the past 72 hour(s))  CBC WITH DIFFERENTIAL     Status: None   Collection Time    03/27/14  3:49 PM      Result Value Ref Range   WBC 6.4  4.0 - 10.5 K/uL   RBC 4.56  3.87 - 5.11 MIL/uL   Hemoglobin 14.2  12.0 - 15.0 g/dL   HCT 42.1  36.0 - 46.0 %   MCV 92.3  78.0 - 100.0 fL   MCH 31.1  26.0 - 34.0 pg   MCHC 33.7  30.0 - 36.0 g/dL   RDW 12.0  11.5 - 15.5 %   Platelets 186  150 - 400 K/uL   Neutrophils Relative % 62  43 - 77 %   Neutro Abs 4.0  1.7 - 7.7 K/uL    Lymphocytes Relative 33  12 - 46 %   Lymphs Abs 2.1  0.7 - 4.0 K/uL   Monocytes Relative 5  3 - 12 %   Monocytes Absolute 0.3  0.1 - 1.0 K/uL   Eosinophils Relative 0  0 - 5 %   Eosinophils Absolute 0.0  0.0 - 0.7 K/uL   Basophils Relative 0  0 - 1 %   Basophils Absolute 0.0  0.0 - 0.1 K/uL  COMPREHENSIVE METABOLIC PANEL     Status: Abnormal   Collection Time    03/27/14  3:49 PM      Result Value Ref Range   Sodium 140  137 - 147 mEq/L   Potassium 3.9  3.7 - 5.3 mEq/L   Chloride 103  96 - 112 mEq/L   CO2 22  19 - 32 mEq/L   Glucose, Bld 104 (*) 70 - 99 mg/dL   BUN 8  6 - 23 mg/dL   Creatinine, Ser 0.74  0.50 - 1.10 mg/dL   Calcium 9.2  8.4 - 10.5 mg/dL   Total Protein 7.2  6.0 - 8.3 g/dL   Albumin 3.9  3.5 - 5.2 g/dL   AST 17  0 - 37 U/L   ALT 22  0 - 35 U/L   Alkaline Phosphatase 99  39 - 117 U/L   Total Bilirubin <0.2 (*) 0.3 - 1.2 mg/dL   GFR calc non Af Amer >90  >90 mL/min   GFR calc Af Amer >90  >90 mL/min   Comment: (NOTE)     The eGFR has been calculated using the CKD EPI equation.     This calculation has not been validated in all clinical situations.     eGFR's persistently <90 mL/min signify possible Chronic Kidney     Disease.  ETHANOL     Status: None   Collection Time    03/27/14  3:49 PM      Result Value Ref Range   Alcohol, Ethyl (B) <11  0 - 11 mg/dL   Comment:            LOWEST DETECTABLE LIMIT FOR     SERUM ALCOHOL IS 11 mg/dL     FOR MEDICAL PURPOSES ONLY  SALICYLATE LEVEL     Status: Abnormal   Collection Time    03/27/14  3:50 PM      Result Value Ref Range  Salicylate Lvl <9.4 (*) 2.8 - 20.0 mg/dL  ACETAMINOPHEN LEVEL     Status: None   Collection Time    03/27/14  3:50 PM      Result Value Ref Range   Acetaminophen (Tylenol), Serum <15.0  10 - 30 ug/mL   Comment:            THERAPEUTIC CONCENTRATIONS VARY     SIGNIFICANTLY. A RANGE OF 10-30     ug/mL MAY BE AN EFFECTIVE     CONCENTRATION FOR MANY PATIENTS.     HOWEVER, SOME ARE BEST  TREATED     AT CONCENTRATIONS OUTSIDE THIS     RANGE.     ACETAMINOPHEN CONCENTRATIONS     >150 ug/mL AT 4 HOURS AFTER     INGESTION AND >50 ug/mL AT 12     HOURS AFTER INGESTION ARE     OFTEN ASSOCIATED WITH TOXIC     REACTIONS.  URINE RAPID DRUG SCREEN (HOSP PERFORMED)     Status: Abnormal   Collection Time    03/27/14  4:06 PM      Result Value Ref Range   Opiates NONE DETECTED  NONE DETECTED   Cocaine NONE DETECTED  NONE DETECTED   Benzodiazepines NONE DETECTED  NONE DETECTED   Amphetamines POSITIVE (*) NONE DETECTED   Tetrahydrocannabinol NONE DETECTED  NONE DETECTED   Barbiturates NONE DETECTED  NONE DETECTED   Comment:            DRUG SCREEN FOR MEDICAL PURPOSES     ONLY.  IF CONFIRMATION IS NEEDED     FOR ANY PURPOSE, NOTIFY LAB     WITHIN 5 DAYS.                LOWEST DETECTABLE LIMITS     FOR URINE DRUG SCREEN     Drug Class       Cutoff (ng/mL)     Amphetamine      1000     Barbiturate      200     Benzodiazepine   327     Tricyclics       614     Opiates          300     Cocaine          300     THC              50  CARBAMAZEPINE LEVEL, TOTAL     Status: None   Collection Time    03/27/14  4:33 PM      Result Value Ref Range   Carbamazepine Lvl 8.4  4.0 - 12.0 ug/mL   Comment: Performed at [facility] are reviewed and are pertinent for no psychiatric issue.  Current Facility-Administered Medications  Medication Dose Route Frequency Provider Last Rate Last Dose  . acetaminophen (TYLENOL) tablet 650 mg  650 mg Oral Q4H PRN Blanchie Dessert, MD      . alum & mag hydroxide-simeth (MAALOX/MYLANTA) 200-200-20 MG/5ML suspension 30 mL  30 mL Oral PRN Blanchie Dessert, MD      . famotidine (PEPCID) tablet 40 mg  40 mg Oral BID Blanchie Dessert, MD   40 mg at 03/28/14 0828  . ondansetron (ZOFRAN) tablet 4 mg  4 mg Oral Q8H PRN Blanchie Dessert, MD   4 mg at 03/27/14 2149  . zolpidem (AMBIEN) tablet 5 mg  5 mg Oral QHS PRN Blanchie Dessert, MD  Current Outpatient Prescriptions  Medication Sig Dispense Refill  . albuterol (PROVENTIL HFA;VENTOLIN HFA) 108 (90 BASE) MCG/ACT inhaler Inhale 2 puffs into the lungs every 6 (six) hours as needed for wheezing or shortness of breath.      . carbamazepine (TEGRETOL XR) 400 MG 12 hr tablet Take 1 tablet (400 mg total) by mouth 2 (two) times daily.  60 tablet  0  . citalopram (CELEXA) 20 MG tablet Take 1 tablet (20 mg total) by mouth daily.  30 tablet  0  . citalopram (CELEXA) 40 MG tablet Take 40 mg by mouth daily. To stop the 20 mg celexa after today 03/27/14      . ibuprofen (ADVIL,MOTRIN) 200 MG tablet Take 13,800 mg by mouth once.      [name] LORazepam (ATIVAN) 0.5 MG tablet Take 0.5 mg by mouth 3 (three) times daily.      . traZODone (DESYREL) 300 MG tablet Take 1 tablet (300 mg total) by mouth at bedtime.  30 tablet  0    Psychiatric Specialty Exam:     Blood pressure 103/73, pulse 68, temperature 97.9 F (36.6 C), temperature source Oral, resp. rate 18, height _0  (1.727 m), last menstrual period 03/27/2014, SpO2 100.00%.There is no weight on file to calculate BMI.  General Appearance: Casual  Eye Contact::  Good  Speech:  Clear and Coherent  Volume:  Normal  Mood:  Euthymic  Affect:  Appropriate  Thought Process:  Coherent  Orientation:  Full (Time, Place, and Person)  Thought Content:  Negative  Suicidal Thoughts:  No  Homicidal Thoughts:  No  Memory:  Immediate;   Good Recent;   Good Remote;   Good  Judgement:  Intact  Insight:  Fair  Psychomotor Activity:  Normal  Concentration:  Good  Recall:  Good  Fund of Knowledge:Good  Language: Good  Akathisia:  Negative  Handed:  Right  AIMS (if indicated):     Assets:  Agricultural consultant Housing Physical Health Social Support Transportation  Sleep:      Musculoskeletal: Strength & Muscle Tone: within normal limits Gait & Station: normal Patient leans: N/A  Treatment Plan  Summary: discharge home to be followed outpatient  Clarene Reamer 03/28/2014 11:07 AM

## 2014-04-18 ENCOUNTER — Emergency Department (HOSPITAL_COMMUNITY)
Admission: EM | Admit: 2014-04-18 | Discharge: 2014-04-18 | Discharge status: Against Medical Advice | Payer: Medicare HMO | Attending: Emergency Medicine | Admitting: Emergency Medicine

## 2014-04-18 ENCOUNTER — Encounter (HOSPITAL_COMMUNITY): Payer: Self-pay | Admitting: Emergency Medicine

## 2014-04-18 DIAGNOSIS — R5383 Other fatigue: Principal | ICD-10-CM

## 2014-04-18 DIAGNOSIS — R5381 Other malaise: Secondary | ICD-10-CM | POA: Insufficient documentation

## 2014-04-18 DIAGNOSIS — J45909 Unspecified asthma, uncomplicated: Secondary | ICD-10-CM | POA: Insufficient documentation

## 2014-04-18 NOTE — ED Notes (Signed)
Pt encouraged to stay and be evaluated by a physician, pt continues to refuse further evaluation or treatment, pt also refused to sign AMA
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
## 2014-04-18 NOTE — ED Notes (Signed)
Pt presents to ED via [facility], pt from home reports weakness x3 weeks, pt is requesting to speak with social worker because she does not want to go back home.

## 2014-04-21 ENCOUNTER — Encounter: Payer: Self-pay | Admitting: Internal Medicine

## 2014-04-21 ENCOUNTER — Ambulatory Visit (INDEPENDENT_AMBULATORY_CARE_PROVIDER_SITE_OTHER): Payer: Medicare HMO | Admitting: Internal Medicine

## 2014-04-21 VITALS — BP 123/84 | HR 84 | Temp 98.9°F | Ht 66.0 in | Wt 255.2 lb

## 2014-04-21 DIAGNOSIS — R5383 Other fatigue: Secondary | ICD-10-CM

## 2014-04-21 DIAGNOSIS — R5381 Other malaise: Secondary | ICD-10-CM

## 2014-04-21 NOTE — Progress Notes (Signed)
   Subjective:    Patient ID: [name], female    DOB: [birth year], 28 y.o.   MRN: [id number]  HPI [name] is a 28 yo woman pmh as as listed below that presents with generalized weakness.   The patient states that this occurred over about a month time. Her appetitie has decreased and she makes requests to enter the hospital for hydration and IVF. The patient does not participate in the interview and cannot be redirected. While in the interview the patient is eating and drinking take away food without any problem. Her main issue is ongoing fatigue and feeling down. The patient makes several requests that "if i can't go into hospital just give me a pill that will give me boosts of energy or make me feel high." After explaining to the patient that given some of her medications that she is on can cause changes in mood and also thyroid function and that some simple lab tests can be performed the patient requested to leave and exited the interview/visit.   The patient has extensive psychiatric disease and recent evaluation for suicide 03/28/14. She was cleared at that time but reported increased impulsive behavior that led to the ingestion of pills (ibuprophen).   Review of Systems  Constitutional: Positive for appetite change and fatigue. Negative for fever, activity change and unexpected weight change.  Cardiovascular: Negative for chest pain.  Gastrointestinal: Positive for vomiting. Negative for nausea, abdominal pain, diarrhea, constipation, blood in stool and abdominal distention.       Objective:   Physical Exam Filed Vitals:   04/21/14 1519  BP: 123/84  Pulse: 84  Temp: 98.9 F (37.2 C)   General: sitting in chair, NAD, drinking soda and eating [employer] HEENT: PERRL, EOMI, no scleral icterus Cardiac: RRR, no rubs, murmurs or gallops Pulm: clear to auscultation bilaterally, moving normal volumes of air Abd: soft, nontender, nondistended, BS present Ext: warm and well  perfused, no pedal edema, normal capillary refill, right leg in immobilizer Neuro: alert and oriented X3, cranial nerves II-XII grossly intact    Assessment & Plan:  Please see problem oriented charting  Pt discussed with Dr. Beryle Beams

## 2014-04-22 DIAGNOSIS — R5383 Other fatigue: Secondary | ICD-10-CM | POA: Insufficient documentation

## 2014-04-22 NOTE — Assessment & Plan Note (Signed)
Pt with a very vague description and was not cooperative or wanting to complete ROS interview to isolate possible etiology. Pt left visit prematurely after her requests for admission and "pills to make her have energy" were denied before preliminary investigations such as TSH, CBC, and Cmet were completed. Pt appeared to be eating food during interview without concern or vomiting. Pt has also had a weight gain since last evaluation contradicting her perception of inability to eat. The patient was advised that if her symptoms continued to make another clinic appt with her PCP. Pt denied all workup.  Wt Readings from Last 3 Encounters:  04/21/14 255 lb 3.2 oz (115.758 kg)  04/18/14 253 lb (114.76 kg)  03/03/14 259 lb (117.482 kg)

## 2014-04-24 NOTE — Progress Notes (Signed)
Attending physician note: Presenting complaints, physical findings, and medications, discussed with resident physician Dr. Clinton Gallant and I concur with her management. This patient has nonspecific complaints with no good medical or physical correlates. Murriel Hopper, M.D., [place]

## 2014-05-13 ENCOUNTER — Emergency Department (INDEPENDENT_AMBULATORY_CARE_PROVIDER_SITE_OTHER)
Admission: EM | Admit: 2014-05-13 | Discharge: 2014-05-13 | Disposition: A | Discharge status: Home / Self Care | Payer: Medicare HMO | Source: Home / Self Care | Attending: Emergency Medicine | Admitting: Emergency Medicine

## 2014-05-13 ENCOUNTER — Encounter (HOSPITAL_COMMUNITY): Payer: Self-pay | Admitting: Emergency Medicine

## 2014-05-13 DIAGNOSIS — A088 Other specified intestinal infections: Secondary | ICD-10-CM

## 2014-05-13 DIAGNOSIS — A084 Viral intestinal infection, unspecified: Secondary | ICD-10-CM

## 2014-05-13 DIAGNOSIS — J029 Acute pharyngitis, unspecified: Secondary | ICD-10-CM

## 2014-05-13 LAB — POCT RAPID STREP A: Streptococcus, Group A Screen (Direct): NEGATIVE

## 2014-05-13 MED ORDER — ONDANSETRON 4 MG PO TBDP
8.0000 mg | ORAL_TABLET | Freq: Once | ORAL | Status: AC
  Administered 2014-05-13: 8 mg via ORAL

## 2014-05-13 MED ORDER — ONDANSETRON 8 MG PO TBDP: 8.0000 mg | ORAL_TABLET | Freq: Three times a day (TID) | ORAL | Status: DC | PRN

## 2014-05-13 MED ORDER — ONDANSETRON 4 MG PO TBDP
ORAL_TABLET | ORAL | Status: AC
  Filled 2014-05-13: qty 2

## 2014-05-13 MED ORDER — NAPROXEN 500 MG PO TABS: 500.0000 mg | ORAL_TABLET | Freq: Two times a day (BID) | ORAL | Status: DC

## 2014-05-13 MED ORDER — PREDNISONE 20 MG PO TABS: 20.0000 mg | ORAL_TABLET | Freq: Two times a day (BID) | ORAL | Status: DC

## 2014-05-13 NOTE — ED Notes (Signed)
C/O sore throat and vomiting since yesterday.  States feels like strep throat.  Has not taken any meds.  Pt with small emesis in exam room.

## 2014-05-13 NOTE — Discharge Instructions (Signed)
You have been diagnosed with gastroenteritis.  This can be caused by a virus or a bacteria.  Viral infections can last from less than a day to a week.  If your symptoms last more than a week, a bacterial infection is more likely.  Either way, you must assume you are contagious and take infectious precautions.  If you work in food preparation, you should stay out of work.  Likewise, you should not prepare food for your family.  Practice frequent hand washing.  Hand sanitizer does not reliably kill the virus.  Wash your hands after you use the bathroom, touch your mouth or face, and before contact with anyone.  Do not kiss anyone and do not let anyone eat or drink after you.  For right now, we recommend taking only clear liquids.  This would include things like Gator Aid or other sports drinks, tea, water, ice chips, clear juices, ginger ale, Seven-Up, Sprite, Pedialyte, jello, clear broth--anything you can see through and applesauce.  You should do this for at least 24 hours, perhaps longer.  We recommend small sips at a time.  Sometimes drinking a large amount will cause you to be nauseated and you will vomit it back up.  Sometimes it helps to have this chilled or drink it over ice chips.  Once your stomach settles down a little, you can advance to a very light diet.  We have a diet called the b.r.a.t. Diet which stands for the following:  Bananas  Rice  Apple sauce (not apple juice)  Toast or crackers.  If diarrhea becomes a problem, you may try Imodeum unless your doctor tells you not to. You can take up to 4 per day or 1 every 6 hours.  Stick with this for about 24 hours, then you may advance to a more regular diet, but your stomach will be sensitive for 5 to 7 days, so it would be a good idea to avoid heavy, greasy, fried, or spicey foods.    You should return if:  You symptoms are not better in 3 days or they have gone on for 7 days total.  You have severe symptoms of high fever or severe  abdominal pain.  You feel you are getting dehydrated with dizziness, weakness, muscle cramps, or severe fatigue.  You have blood in your vomitus or stool.  This includes black discoloration of your vomitus or stool.  But remember that [place] can cause black stools.     Sore Throat A sore throat is pain, burning, irritation, or scratchiness of the throat. There is often pain or tenderness when swallowing or talking. A sore throat may be accompanied by other symptoms, such as coughing, sneezing, fever, and swollen neck glands. A sore throat is often the first sign of another sickness, such as a cold, flu, strep throat, or mononucleosis (commonly known as mono). Most sore throats go away without medical treatment. CAUSES  The most common causes of a sore throat include:  A viral infection, such as a cold, flu, or mono.  A bacterial infection, such as strep throat, tonsillitis, or whooping cough.  Seasonal allergies.  Dryness in the air.  Irritants, such as smoke or pollution.  Gastroesophageal reflux disease (GERD). HOME CARE INSTRUCTIONS   Only take over-the-counter medicines as directed by your caregiver.  Drink enough fluids to keep your urine clear or pale yellow.  Rest as needed.  Try using throat sprays, lozenges, or sucking on hard candy to ease any pain (  if older than 4 years or as directed).  Sip warm liquids, such as broth, herbal tea, or warm water with honey to relieve pain temporarily. You may also eat or drink cold or frozen liquids such as frozen ice pops.  Gargle with salt water (mix 1 tsp salt with 8 oz of water).  Do not smoke and avoid secondhand smoke.  Put a cool-mist humidifier in your bedroom at night to moisten the air. You can also turn on a hot shower and sit in the bathroom with the door closed for 5 10 minutes. SEEK IMMEDIATE MEDICAL CARE IF:  You have difficulty breathing.  You are unable to swallow fluids, soft foods, or your  saliva.  You have increased swelling in the throat.  Your sore throat does not get better in 7 days.  You have nausea and vomiting.  You have a fever or persistent symptoms for more than 2 3 days.  You have a fever and your symptoms suddenly get worse. MAKE SURE YOU:   Understand these instructions.  Will watch your condition.  Will get help right away if you are not doing well or get worse. Document Released: 01/08/2005 Document Revised: 11/17/2012 Document Reviewed: 08/08/2012 [facility] Patient Information 2014 [place], [place].

## 2014-05-13 NOTE — ED Provider Notes (Signed)
Chief Complaint   Chief Complaint  Patient presents with  . Sore Throat  . Emesis    History of Present Illness   [name] is a 28 year old female who has had a history since yesterday of sore throat, chills, nasal congestion, cough, and nausea and vomiting. She's not had a fever, headache, nasal drainage, sputum production, wheezing, or diarrhea. She denies any abdominal pain. She's had no known sick exposures. She has a history of strep a year ago.  Review of Systems   Other than as noted above, the patient denies any of the following symptoms: Systemic:  No fevers, chills, sweats, or myalgias. Eye:  No redness or discharge. ENT:  No ear pain, headache, nasal congestion, drainage, sinus pressure, or sore throat. Neck:  No neck pain, stiffness, or swollen glands. Lungs:  No cough, sputum production, hemoptysis, wheezing, chest tightness, shortness of breath or chest pain. GI:  No abdominal pain, nausea, vomiting or diarrhea.  [place]   Past medical history, family history, social history, meds, and allergies were reviewed.   Physical exam   Vital signs:  BP 117/91  Pulse 105  Temp(Src) 99.4 F (37.4 C) (Oral)  Resp 20  SpO2 98%  LMP 05/11/2014 General:  Alert and oriented.  In no distress.  Skin warm and dry. Eye:  No conjunctival injection or drainage. Lids were normal. ENT:  TMs and canals were normal, without erythema or inflammation.  Nasal mucosa was clear and uncongested, without drainage.  Mucous membranes were moist.  Pharynx was clear with no exudate or drainage.  There were no oral ulcerations or lesions. Neck:  Supple, no adenopathy, tenderness or mass. Lungs:  No respiratory distress.  Lungs were clear to auscultation, without wheezes, rales or rhonchi.  Breath sounds were clear and equal bilaterally.  Heart:  Regular rhythm, without gallops, murmers or rubs. Skin:  Clear, warm, and dry, without rash or lesions.  Labs   Results for orders placed  during the hospital encounter of 05/13/14  POCT RAPID STREP A ([facility] URG CARE ONLY)      Result Value Ref Range   Streptococcus, Group A Screen (Direct) NEGATIVE  NEGATIVE    Course in Urgent [place]   Given Zofran ODT 8 mg sublingually.  Assessment     The primary encounter diagnosis was Viral pharyngitis. A diagnosis of Viral gastroenteritis was also pertinent to this visit.  Plan    1.  Meds:  The following meds were prescribed:   Discharge Medication List as of 05/13/2014  2:03 PM    START taking these medications   Details  naproxen (NAPROSYN) 500 MG tablet Take 1 tablet (500 mg total) by mouth 2 (two) times daily., Starting 05/13/2014, Until Discontinued, Normal    ondansetron (ZOFRAN ODT) 8 MG disintegrating tablet Take 1 tablet (8 mg total) by mouth every 8 (eight) hours as needed for nausea., Starting 05/13/2014, Until Discontinued, Normal    predniSONE (DELTASONE) 20 MG tablet Take 1 tablet (20 mg total) by mouth 2 (two) times daily., Starting 05/13/2014, Until Discontinued, Normal        2.  Patient Education/Counseling:  The patient was given appropriate handouts, self care instructions, and instructed in symptomatic relief.  Instructed to get extra fluids, rest, and use a cool mist vaporizer.    3.  Follow up:  The patient was told to follow up here if no better in 3 to 4 days, or sooner if becoming worse in any way, and given some  red flag symptoms such as increasing fever, difficulty breathing, chest pain, or persistent vomiting which would prompt immediate return.  Follow up here as needed.      Harden Mo, MD 05/13/14 Carollee Massed

## 2014-05-15 LAB — CULTURE, GROUP A STREP

## 2014-05-16 ENCOUNTER — Emergency Department (HOSPITAL_COMMUNITY)
Admission: EM | Admit: 2014-05-16 | Discharge: 2014-05-16 | Discharge status: Against Medical Advice | Payer: Medicare HMO | Attending: Emergency Medicine | Admitting: Emergency Medicine

## 2014-05-16 ENCOUNTER — Encounter (HOSPITAL_COMMUNITY): Payer: Self-pay | Admitting: Emergency Medicine

## 2014-05-16 DIAGNOSIS — J45909 Unspecified asthma, uncomplicated: Secondary | ICD-10-CM | POA: Insufficient documentation

## 2014-05-16 DIAGNOSIS — R1110 Vomiting, unspecified: Secondary | ICD-10-CM | POA: Insufficient documentation

## 2014-05-16 NOTE — ED Notes (Signed)
Pt decided after being placed in triage #1 that she is going to leave and will see her PMD tomorrow.

## 2014-05-16 NOTE — ED Notes (Signed)
Pt states that she has been sick since Friday but has vomited 20 times today. Took zofran with no relief.

## 2014-05-18 ENCOUNTER — Emergency Department (HOSPITAL_COMMUNITY)
Admission: EM | Admit: 2014-05-18 | Discharge: 2014-05-18 | Discharge status: Against Medical Advice | Payer: Medicare HMO | Attending: Emergency Medicine | Admitting: Emergency Medicine

## 2014-05-18 ENCOUNTER — Emergency Department (HOSPITAL_COMMUNITY): Payer: Medicare HMO

## 2014-05-18 ENCOUNTER — Encounter (HOSPITAL_COMMUNITY): Payer: Self-pay | Admitting: Emergency Medicine

## 2014-05-18 DIAGNOSIS — Z791 Long term (current) use of non-steroidal anti-inflammatories (NSAID): Secondary | ICD-10-CM | POA: Insufficient documentation

## 2014-05-18 DIAGNOSIS — R109 Unspecified abdominal pain: Secondary | ICD-10-CM

## 2014-05-18 DIAGNOSIS — J029 Acute pharyngitis, unspecified: Secondary | ICD-10-CM | POA: Insufficient documentation

## 2014-05-18 DIAGNOSIS — IMO0002 Reserved for concepts with insufficient information to code with codable children: Secondary | ICD-10-CM | POA: Insufficient documentation

## 2014-05-18 DIAGNOSIS — Z8719 Personal history of other diseases of the digestive system: Secondary | ICD-10-CM | POA: Insufficient documentation

## 2014-05-18 DIAGNOSIS — Z79899 Other long term (current) drug therapy: Secondary | ICD-10-CM | POA: Insufficient documentation

## 2014-05-18 DIAGNOSIS — F319 Bipolar disorder, unspecified: Secondary | ICD-10-CM | POA: Insufficient documentation

## 2014-05-18 DIAGNOSIS — F411 Generalized anxiety disorder: Secondary | ICD-10-CM | POA: Insufficient documentation

## 2014-05-18 DIAGNOSIS — R112 Nausea with vomiting, unspecified: Secondary | ICD-10-CM | POA: Insufficient documentation

## 2014-05-18 DIAGNOSIS — Z87440 Personal history of urinary (tract) infections: Secondary | ICD-10-CM | POA: Insufficient documentation

## 2014-05-18 DIAGNOSIS — R1011 Right upper quadrant pain: Secondary | ICD-10-CM | POA: Insufficient documentation

## 2014-05-18 DIAGNOSIS — J45909 Unspecified asthma, uncomplicated: Secondary | ICD-10-CM | POA: Insufficient documentation

## 2014-05-18 LAB — RAPID STREP SCREEN (MED CTR MEBANE ONLY): Streptococcus, Group A Screen (Direct): NEGATIVE

## 2014-05-18 LAB — CBC WITH DIFFERENTIAL/PLATELET
Basophils Absolute: 0 10*3/uL (ref 0.0–0.1)
Basophils Relative: 1 % (ref 0–1)
Eosinophils Absolute: 0.2 10*3/uL (ref 0.0–0.7)
Eosinophils Relative: 3 % (ref 0–5)
HCT: 39.8 % (ref 36.0–46.0)
Hemoglobin: 13.2 g/dL (ref 12.0–15.0)
Lymphocytes Relative: 36 % (ref 12–46)
Lymphs Abs: 2.1 10*3/uL (ref 0.7–4.0)
MCH: 31.7 pg (ref 26.0–34.0)
MCHC: 33.2 g/dL (ref 30.0–36.0)
MCV: 95.4 fL (ref 78.0–100.0)
Monocytes Absolute: 0.4 10*3/uL (ref 0.1–1.0)
Monocytes Relative: 7 % (ref 3–12)
Neutro Abs: 3.2 10*3/uL (ref 1.7–7.7)
Neutrophils Relative %: 53 % (ref 43–77)
Platelets: 175 10*3/uL (ref 150–400)
RBC: 4.17 MIL/uL (ref 3.87–5.11)
RDW: 12.7 % (ref 11.5–15.5)
WBC: 6 10*3/uL (ref 4.0–10.5)

## 2014-05-18 LAB — LIPASE, BLOOD: Lipase: 16 U/L (ref 11–59)

## 2014-05-18 LAB — COMPREHENSIVE METABOLIC PANEL
ALT: 22 U/L (ref 0–35)
AST: 16 U/L (ref 0–37)
Albumin: 3.2 g/dL — ABNORMAL LOW (ref 3.5–5.2)
Alkaline Phosphatase: 86 U/L (ref 39–117)
BUN: 11 mg/dL (ref 6–23)
CO2: 29 mEq/L (ref 19–32)
Calcium: 8.8 mg/dL (ref 8.4–10.5)
Chloride: 105 mEq/L (ref 96–112)
Creatinine, Ser: 0.85 mg/dL (ref 0.50–1.10)
GFR calc Af Amer: >90 mL/min (ref >90)
GFR calc non Af Amer: >90 mL/min (ref >90)
Glucose, Bld: 89 mg/dL (ref 70–99)
Potassium: 4.7 mEq/L (ref 3.7–5.3)
Sodium: 142 mEq/L (ref 137–147)
Total Bilirubin: <0.2 mg/dL — ABNORMAL LOW (ref 0.3–1.2)
Total Protein: 6.5 g/dL (ref 6.0–8.3)

## 2014-05-18 NOTE — ED Notes (Signed)
Pt returned from [place]. Pt states she cannot give a urine sample "and I don't want to try." Pt made aware of importance to sample.

## 2014-05-18 NOTE — ED Notes (Signed)
Pt refusing to drink water or attempt to go to the bathroom. PA made aware. Pt requesting to be in and out cath; states "I just don't want to go to the bathroom."

## 2014-05-18 NOTE — ED Notes (Signed)
Pt now refusing in and out cath. States "I am just going to leave. I don't feel like doing this anymore." PA Herbie Baltimore made aware.

## 2014-05-18 NOTE — ED Notes (Addendum)
Per EMS: Pt reports sore throat x 4 days. Pt also reports generalized abdominal pain with N/V x 2 days. Airway intact. AO x4.

## 2014-05-18 NOTE — ED Notes (Signed)
PT decided to leave AMA. Unable to assess last set of vital signs. Pt in NAD.

## 2014-05-18 NOTE — ED Provider Notes (Signed)
CSN: [id number]     Arrival date & time 05/18/14  1115 History   First MD Initiated Contact with Patient 05/18/14 1117     Chief Complaint  Patient presents with  . Sore Throat  . Abdominal Pain     (Consider location/radiation/quality/duration/timing/severity/associated sxs/prior Treatment) HPI Comments: Patient presents to the emergency department with chief complaint of abdominal pain and sore throat. She states that she has been having symptoms for the past week. She reports associated nausea and vomiting. He states that her pain as a 6/10. It does not radiate. She denies fevers, chills, diarrhea, or constipation. She denies any history of abdominal surgeries. There are no aggravating or alleviating factors.  The history is provided by the patient. No language interpreter was used.    Past Medical History  Diagnosis Date  . Bipolar disorder   . Attention deficit hyperactivity disorder   . Depression   . Anxiety   . Asthma   . UTI (urinary tract infection)     Completed Cipro 08/06/12  . Gastritis    Past Surgical History  Procedure Laterality Date  . Mouth surgery    . Eye muscle surgery Bilateral     07/29/12   Family History  Problem Relation Age of Onset  . Depression Mother   . Depression Father   . Depression Brother   . Colon cancer Mother   . Colon polyps Mother   . Liver disease    . Kidney disease     History  Substance Use Topics  . Smoking status: Never Smoker   . Smokeless tobacco: Never Used  . Alcohol Use: No   OB History   Grav Para Term Preterm Abortions TAB SAB Ect Mult Living   0 0             Review of Systems  Constitutional: Negative for fever and chills.  HENT: Positive for sore throat.   Respiratory: Negative for shortness of breath.   Cardiovascular: Negative for chest pain.  Gastrointestinal: Positive for nausea and vomiting. Negative for diarrhea and constipation.  Genitourinary: Negative for dysuria.      Allergies   Depakote; Methylphenidate derivatives; Neurontin; and Prozac  Home Medications   Prior to Admission medications   Medication Sig Start Date End Date Taking? Authorizing Provider  albuterol (PROVENTIL HFA;VENTOLIN HFA) 108 (90 BASE) MCG/ACT inhaler Inhale 2 puffs into the lungs every 6 (six) hours as needed for wheezing or shortness of breath.    Historical Provider, MD  carbamazepine (TEGRETOL XR) 400 MG 12 hr tablet Take 1 tablet (400 mg total) by mouth 2 (two) times daily. 02/14/14   Elmarie Shiley, NP  citalopram (CELEXA) 20 MG tablet Take 1 tablet (20 mg total) by mouth daily. 02/14/14   Elmarie Shiley, NP  citalopram (CELEXA) 40 MG tablet Take 40 mg by mouth daily. To stop the 20 mg celexa after today 03/27/14 03/28/14   Historical Provider, MD  ibuprofen (ADVIL,MOTRIN) 200 MG tablet Take 13,800 mg by mouth once.    Historical Provider, MD  LORazepam (ATIVAN) 0.5 MG tablet Take 0.5 mg by mouth 3 (three) times daily.    Historical Provider, MD  naproxen (NAPROSYN) 500 MG tablet Take 1 tablet (500 mg total) by mouth 2 (two) times daily. 05/13/14   Harden Mo, MD  ondansetron (ZOFRAN ODT) 8 MG disintegrating tablet Take 1 tablet (8 mg total) by mouth every 8 (eight) hours as needed for nausea. 05/13/14   Harden Mo, MD  predniSONE (DELTASONE) 20 MG tablet Take 1 tablet (20 mg total) by mouth 2 (two) times daily. 05/13/14   Harden Mo, MD  traZODone (DESYREL) 300 MG tablet Take 1 tablet (300 mg total) by mouth at bedtime. 02/14/14   Elmarie Shiley, NP   BP 117/82  Pulse 88  Temp(Src) 98.2 F (36.8 C) (Oral)  Resp 16  SpO2 97%  LMP 05/11/2014 Physical Exam  Nursing note and vitals reviewed. Constitutional: She is oriented to person, place, and time. She appears well-developed and well-nourished.  HENT:  Head: Normocephalic and atraumatic.  Eyes: Conjunctivae and EOM are normal. Pupils are equal, round, and reactive to light.  Neck: Normal range of motion. Neck supple.  Cardiovascular: Normal  rate and regular rhythm.  Exam reveals no gallop and no friction rub.   No murmur heard. Pulmonary/Chest: Effort normal and breath sounds normal. No respiratory distress. She has no wheezes. She has no rales. She exhibits no tenderness.  Abdominal: Soft. Bowel sounds are normal. She exhibits no distension and no mass. There is tenderness. There is no rebound and no guarding.  Right upper quadrant is moderately tender to palpation, no other focal abdominal tenderness  Musculoskeletal: Normal range of motion. She exhibits no edema and no tenderness.  Neurological: She is alert and oriented to person, place, and time.  Skin: Skin is warm and dry.  Psychiatric: She has a normal mood and affect. Her behavior is normal. Judgment and thought content normal.    ED Course  Procedures (including critical care time) Results for orders placed during the hospital encounter of 05/18/14  RAPID STREP SCREEN      Result Value Ref Range   Streptococcus, Group A Screen (Direct) NEGATIVE  NEGATIVE  CBC WITH DIFFERENTIAL      Result Value Ref Range   WBC 6.0  4.0 - 10.5 K/uL   RBC 4.17  3.87 - 5.11 MIL/uL   Hemoglobin 13.2  12.0 - 15.0 g/dL   HCT 39.8  36.0 - 46.0 %   MCV 95.4  78.0 - 100.0 fL   MCH 31.7  26.0 - 34.0 pg   MCHC 33.2  30.0 - 36.0 g/dL   RDW 12.7  11.5 - 15.5 %   Platelets 175  150 - 400 K/uL   Neutrophils Relative % 53  43 - 77 %   Neutro Abs 3.2  1.7 - 7.7 K/uL   Lymphocytes Relative 36  12 - 46 %   Lymphs Abs 2.1  0.7 - 4.0 K/uL   Monocytes Relative 7  3 - 12 %   Monocytes Absolute 0.4  0.1 - 1.0 K/uL   Eosinophils Relative 3  0 - 5 %   Eosinophils Absolute 0.2  0.0 - 0.7 K/uL   Basophils Relative 1  0 - 1 %   Basophils Absolute 0.0  0.0 - 0.1 K/uL  COMPREHENSIVE METABOLIC PANEL      Result Value Ref Range   Sodium 142  137 - 147 mEq/L   Potassium 4.7  3.7 - 5.3 mEq/L   Chloride 105  96 - 112 mEq/L   CO2 29  19 - 32 mEq/L   Glucose, Bld 89  70 - 99 mg/dL   BUN 11  6 - 23  mg/dL   Creatinine, Ser 0.85  0.50 - 1.10 mg/dL   Calcium 8.8  8.4 - 10.5 mg/dL   Total Protein 6.5  6.0 - 8.3 g/dL   Albumin 3.2 (*) 3.5 - 5.2 g/dL  AST 16  0 - 37 U/L   ALT 22  0 - 35 U/L   Alkaline Phosphatase 86  39 - 117 U/L   Total Bilirubin <0.2 (*) 0.3 - 1.2 mg/dL   GFR calc non Af Amer >90  >90 mL/min   GFR calc Af Amer >90  >90 mL/min  LIPASE, BLOOD      Result Value Ref Range   Lipase 16  11 - 59 U/L   US Abdomen Limited  05/18/2014   CLINICAL DATA:  Right upper quadrant tenderness  EXAM: US ABDOMEN LIMITED - RIGHT UPPER QUADRANT  COMPARISON:  03/19/2009  FINDINGS: Gallbladder:  No gallstones or wall thickening visualized. No sonographic Murphy sign noted.  Common bile duct:  Diameter: 2.5 mm  Liver:  No focal lesion identified. Within normal limits in parenchymal echogenicity.  IMPRESSION: Normal exam   Electronically Signed   By: Kerby Moors M.D.   On: 05/18/2014 12:40     No results found.   EKG Interpretation None      MDM   Final diagnoses:  None    Patient with abdominal pain, nausea, and vomiting times one week. She also complains of right upper quadrant abdominal pain. No prior abdominal surgical history. Will check labs, an order right upper quadrant ultrasound.  Labs are reassuring.  UA and urine preg are still pending.  1:42 PM Patient eloped prior to giving urine sample.   Montine Circle, PA-C 05/18/14 1343

## 2014-05-18 NOTE — ED Provider Notes (Signed)
Medical screening examination/treatment/procedure(s) were performed by non-physician practitioner and as supervising physician I was immediately available for consultation/collaboration.   EKG Interpretation None        Houston Siren III, MD 05/18/14 2038

## 2014-05-20 LAB — CULTURE, GROUP A STREP

## 2014-07-05 ENCOUNTER — Encounter (HOSPITAL_COMMUNITY): Payer: Self-pay | Admitting: Emergency Medicine

## 2014-07-05 ENCOUNTER — Emergency Department (HOSPITAL_COMMUNITY)
Admission: EM | Admit: 2014-07-05 | Discharge: 2014-07-05 | Disposition: A | Discharge status: Home / Self Care | Payer: Medicare HMO | Attending: Emergency Medicine | Admitting: Emergency Medicine

## 2014-07-05 DIAGNOSIS — IMO0002 Reserved for concepts with insufficient information to code with codable children: Secondary | ICD-10-CM | POA: Diagnosis not present

## 2014-07-05 DIAGNOSIS — F329 Major depressive disorder, single episode, unspecified: Secondary | ICD-10-CM

## 2014-07-05 DIAGNOSIS — Z79899 Other long term (current) drug therapy: Secondary | ICD-10-CM | POA: Diagnosis not present

## 2014-07-05 DIAGNOSIS — F411 Generalized anxiety disorder: Secondary | ICD-10-CM | POA: Diagnosis not present

## 2014-07-05 DIAGNOSIS — F319 Bipolar disorder, unspecified: Secondary | ICD-10-CM | POA: Diagnosis not present

## 2014-07-05 DIAGNOSIS — Z791 Long term (current) use of non-steroidal anti-inflammatories (NSAID): Secondary | ICD-10-CM | POA: Insufficient documentation

## 2014-07-05 DIAGNOSIS — Z8719 Personal history of other diseases of the digestive system: Secondary | ICD-10-CM | POA: Diagnosis not present

## 2014-07-05 DIAGNOSIS — J45909 Unspecified asthma, uncomplicated: Secondary | ICD-10-CM | POA: Diagnosis not present

## 2014-07-05 DIAGNOSIS — Z87440 Personal history of urinary (tract) infections: Secondary | ICD-10-CM | POA: Diagnosis not present

## 2014-07-05 DIAGNOSIS — F3289 Other specified depressive episodes: Secondary | ICD-10-CM | POA: Diagnosis present

## 2014-07-05 DIAGNOSIS — F32A Depression, unspecified: Secondary | ICD-10-CM

## 2014-07-05 LAB — RAPID URINE DRUG SCREEN, HOSP PERFORMED
Amphetamines: POSITIVE — AB
Barbiturates: NOT DETECTED
Benzodiazepines: NOT DETECTED
Cocaine: NOT DETECTED
Opiates: NOT DETECTED
Tetrahydrocannabinol: NOT DETECTED

## 2014-07-05 LAB — COMPREHENSIVE METABOLIC PANEL
ALT: 22 U/L (ref 0–35)
AST: 18 U/L (ref 0–37)
Albumin: 3.6 g/dL (ref 3.5–5.2)
Alkaline Phosphatase: 116 U/L (ref 39–117)
Anion gap: 12 (ref 5–15)
BUN: 9 mg/dL (ref 6–23)
CO2: 25 mEq/L (ref 19–32)
Calcium: 9.3 mg/dL (ref 8.4–10.5)
Chloride: 101 mEq/L (ref 96–112)
Creatinine, Ser: 0.87 mg/dL (ref 0.50–1.10)
GFR calc Af Amer: >90 mL/min (ref >90)
GFR calc non Af Amer: >90 mL/min (ref >90)
Glucose, Bld: 96 mg/dL (ref 70–99)
Potassium: 4.4 mEq/L (ref 3.7–5.3)
Sodium: 138 mEq/L (ref 137–147)
Total Bilirubin: <0.2 mg/dL — ABNORMAL LOW (ref 0.3–1.2)
Total Protein: 7.2 g/dL (ref 6.0–8.3)

## 2014-07-05 LAB — CBC
HCT: 42.6 % (ref 36.0–46.0)
Hemoglobin: 13.7 g/dL (ref 12.0–15.0)
MCH: 30.8 pg (ref 26.0–34.0)
MCHC: 32.2 g/dL (ref 30.0–36.0)
MCV: 95.7 fL (ref 78.0–100.0)
Platelets: 175 10*3/uL (ref 150–400)
RBC: 4.45 MIL/uL (ref 3.87–5.11)
RDW: 12.3 % (ref 11.5–15.5)
WBC: 7.9 10*3/uL (ref 4.0–10.5)

## 2014-07-05 LAB — ETHANOL: Alcohol, Ethyl (B): <11 mg/dL (ref 0–11)

## 2014-07-05 LAB — ACETAMINOPHEN LEVEL: Acetaminophen (Tylenol), Serum: <15 ug/mL (ref 10–30)

## 2014-07-05 LAB — SALICYLATE LEVEL: Salicylate Lvl: <2 mg/dL — ABNORMAL LOW (ref 2.8–20.0)

## 2014-07-05 NOTE — ED Notes (Signed)
PA at bedside.

## 2014-07-05 NOTE — Discharge Instructions (Signed)
Depression, Adult Depression refers to feeling sad, low, down in the dumps, blue, gloomy, or empty. In general, there are two kinds of depression: 1. Depression that we all experience from time to time because of upsetting life experiences, including the loss of a job or the ending of a relationship (normal sadness or normal grief). This kind of depression is considered normal, is short lived, and resolves within a few days to 2 weeks. (Depression experienced after the loss of a loved one is called bereavement. Bereavement often lasts longer than 2 weeks but normally gets better with time.) 2. Clinical depression, which lasts longer than normal sadness or normal grief or interferes with your ability to function at home, at work, and in school. It also interferes with your personal relationships. It affects almost every aspect of your life. Clinical depression is an illness. Symptoms of depression also can be caused by conditions other than normal sadness and grief or clinical depression. Examples of these conditions are listed as follows:  Physical illness--Some physical illnesses, including underactive thyroid gland (hypothyroidism), severe anemia, specific types of cancer, diabetes, uncontrolled seizures, heart and lung problems, strokes, and chronic pain are commonly associated with symptoms of depression.  Side effects of some prescription medicine--In some people, certain types of prescription medicine can cause symptoms of depression.  Substance abuse--Abuse of alcohol and illicit drugs can cause symptoms of depression. SYMPTOMS Symptoms of normal sadness and normal grief include the following:  Feeling sad or crying for short periods of time.  Not caring about anything (apathy).  Difficulty sleeping or sleeping too much.  No longer able to enjoy the things you used to enjoy.  Desire to be by oneself all the time (social isolation).  Lack of energy or motivation.  Difficulty  concentrating or remembering.  Change in appetite or weight.  Restlessness or agitation. Symptoms of clinical depression include the same symptoms of normal sadness or normal grief and also the following symptoms:  Feeling sad or crying all the time.  Feelings of guilt or worthlessness.  Feelings of hopelessness or helplessness.  Thoughts of suicide or the desire to harm yourself (suicidal ideation).  Loss of touch with reality (psychotic symptoms). Seeing or hearing things that are not real (hallucinations) or having false beliefs about your life or the people around you (delusions and paranoia). DIAGNOSIS  The diagnosis of clinical depression usually is based on the severity and duration of the symptoms. Your caregiver also will ask you questions about your medical history and substance use to find out if physical illness, use of prescription medicine, or substance abuse is causing your depression. Your caregiver also may order blood tests. TREATMENT  Typically, normal sadness and normal grief do not require treatment. However, sometimes antidepressant medicine is prescribed for bereavement to ease the depressive symptoms until they resolve. The treatment for clinical depression depends on the severity of your symptoms but typically includes antidepressant medicine, counseling with a mental health professional, or a combination of both. Your caregiver will help to determine what treatment is best for you. Depression caused by physical illness usually goes away with appropriate medical treatment of the illness. If prescription medicine is causing depression, talk with your caregiver about stopping the medicine, decreasing the dose, or substituting another medicine. Depression caused by abuse of alcohol or illicit drugs abuse goes away with abstinence from these substances. Some adults need professional help in order to stop drinking or using drugs. SEEK IMMEDIATE CARE IF:  You have thoughts  about   hurting yourself or others.  You lose touch with reality (have psychotic symptoms).  You are taking medicine for depression and have a serious side effect. FOR MORE INFORMATION National Alliance on Mental Illness: www.nami.org National Institute of Mental Health: www.nimh.nih.gov Document Released: 11/28/2000 Document Revised: 06/01/2012 Document Reviewed: 03/01/2012 Exit[employer] Patient Information 2015 [employer]. This information is not intended to replace advice given to you by your health care provider. Make sure you discuss any questions you have with your health care provider.  

## 2014-07-05 NOTE — ED Notes (Signed)
MD at bedside. 

## 2014-07-05 NOTE — ED Provider Notes (Signed)
CSN: [id number]     Arrival date & time 07/05/14  0120 History   First MD Initiated Contact with Patient 07/05/14 0200     Chief Complaint  Patient presents with  . IVC      (Consider location/radiation/quality/duration/timing/severity/associated sxs/prior Treatment) HPI Comments: Patient is a 28 year old female with a history of bipolar disorder, depression, anxiety, and suicide attempt who presents to the emergency department under IVC. Paperwork states that the respondent has been noncompliant with her outpatient treatment. Givers state that patient said that she has nothing to live for and wants to die, but did not have a plan.  Patient states that she called mobile crisis earlier this evening because she was feeling depressed. Mother states that patient has been experiencing worsening depression over the past few weeks. Mother and patient both deny medication noncompliance. Mother states that she keeps the patient's medication locked up and gives it to her at her scheduled times. Mother states that mobile crisis came to the house after the patient called. During this time, patient and mother declined psychiatric assessment. Patient and mother both state that patient contracted for safety at this time and denied suicidal ideations. Patient denies any SI/HI or auditory/visual hallucinations at this time.  The history is provided by the patient and a parent. No language interpreter was used.    Past Medical History  Diagnosis Date  . Bipolar disorder   . Attention deficit hyperactivity disorder   . Depression   . Anxiety   . Asthma   . UTI (urinary tract infection)     Completed Cipro 08/06/12  . Gastritis    Past Surgical History  Procedure Laterality Date  . Mouth surgery    . Eye muscle surgery Bilateral     07/29/12   Family History  Problem Relation Age of Onset  . Depression Mother   . Depression Father   . Depression Brother   . Colon cancer Mother   . Colon polyps  Mother   . Liver disease    . Kidney disease     History  Substance Use Topics  . Smoking status: Never Smoker   . Smokeless tobacco: Never Used  . Alcohol Use: No   OB History   Grav Para Term Preterm Abortions TAB SAB Ect Mult Living   0 0              Review of Systems  Psychiatric/Behavioral: Positive for behavioral problems.  All other systems reviewed and are negative.    Allergies  Depakote; Methylphenidate derivatives; Neurontin; and Prozac  Home Medications   Prior to Admission medications   Medication Sig Start Date End Date Taking? Authorizing Provider  albuterol (PROVENTIL HFA;VENTOLIN HFA) 108 (90 BASE) MCG/ACT inhaler Inhale 2 puffs into the lungs every 6 (six) hours as needed for wheezing or shortness of breath.    Historical Provider, MD  carbamazepine (TEGRETOL XR) 400 MG 12 hr tablet Take 1 tablet (400 mg total) by mouth 2 (two) times daily. 02/14/14   Elmarie Shiley, NP  citalopram (CELEXA) 20 MG tablet Take 1 tablet (20 mg total) by mouth daily. 02/14/14   Elmarie Shiley, NP  citalopram (CELEXA) 40 MG tablet Take 40 mg by mouth daily. To stop the 20 mg celexa after today 03/27/14 03/28/14   Historical Provider, MD  ibuprofen (ADVIL,MOTRIN) 200 MG tablet Take 13,800 mg by mouth once.    Historical Provider, MD  LORazepam (ATIVAN) 0.5 MG tablet Take 0.5 mg by mouth 3 (three) times  daily.    Historical Provider, MD  naproxen (NAPROSYN) 500 MG tablet Take 1 tablet (500 mg total) by mouth 2 (two) times daily. 05/13/14   Harden Mo, MD  ondansetron (ZOFRAN ODT) 8 MG disintegrating tablet Take 1 tablet (8 mg total) by mouth every 8 (eight) hours as needed for nausea. 05/13/14   Harden Mo, MD  predniSONE (DELTASONE) 20 MG tablet Take 1 tablet (20 mg total) by mouth 2 (two) times daily. 05/13/14   Harden Mo, MD  traZODone (DESYREL) 300 MG tablet Take 1 tablet (300 mg total) by mouth at bedtime. 02/14/14   Elmarie Shiley, NP   BP 94/71  Pulse 108  Temp(Src) 99.8 F (37.7  C) (Oral)  SpO2 97%  Physical Exam  Nursing note and vitals reviewed. Constitutional: She is oriented to person, place, and time. She appears well-developed and well-nourished. No distress.  HENT:  Head: Normocephalic and atraumatic.  Eyes: Conjunctivae and EOM are normal. No scleral icterus.  Neck: Normal range of motion.  Pulmonary/Chest: Effort normal. No respiratory distress.  Musculoskeletal: Normal range of motion.  Neurological: She is alert and oriented to person, place, and time.  Skin: Skin is warm and dry. No rash noted. She is not diaphoretic. No erythema. No pallor.  Psychiatric: Her speech is normal and behavior is normal. Cognition and memory are normal. She exhibits a depressed mood (mild). She expresses no homicidal and no suicidal ideation. She expresses no suicidal plans and no homicidal plans.    ED Course  Procedures (including critical care time) Labs Review Labs Reviewed  URINE RAPID DRUG SCREEN (HOSP PERFORMED) - Abnormal; Notable for the following:    Amphetamines POSITIVE (*)    All other components within normal limits  CBC  ACETAMINOPHEN LEVEL  COMPREHENSIVE METABOLIC PANEL  ETHANOL  SALICYLATE LEVEL    Imaging Review No results found.   EKG Interpretation None      MDM   Final diagnoses:  Depression    28 year old female with known psychiatric history presents to the emergency department under IVC taken out by mobile crisis. IVC papers referenced medication noncompliance and suicidal ideations. Mother and patient both deny medication noncompliance. Mother states that she gets her daughter her medications daily. Patient denies suicidal ideations and is able to contract for safety. Mother concurs the patient is not suicidal, she has only been experiencing some worsening in her depression recently. Patient with outpatient followup. Patient and mother appear reasonable and patient appears to have good insight into her psychiatric illness. Do not  believe patient is a threat to herself or others; believe IVC papers may be rescinded. Patient seen and evaluated also by my supervising physician who is in agreement. Patient stable for discharge with instruction followup with her psychiatrist/therapist as an outpatient. Return precautions provided and patient agreeable to plan with no unaddressed concerns.   Filed Vitals:   07/05/14 0131  BP: 94/71  Pulse: 108  Temp: 99.8 F (37.7 C)     Antonietta Breach, PA-C 07/05/14 [phone]

## 2014-07-05 NOTE — ED Notes (Signed)
Pt has 1 bag of clothing.  Seen and wanded by security.

## 2014-07-05 NOTE — ED Notes (Signed)
Pt states she was feeling depressed earlier today and called mobile crisis  They came out and talked to her and she states she felt better  Pt states later the police showed up and brought her here  Pt denies SI/HI at this time

## 2014-07-05 NOTE — ED Notes (Signed)
Pt brought in by police under IVC  Paperwork states that the respondent is bipolar and is noncompliant with her outpt treatment  Papers state the pt said she has nothing to live for and wants to die but refuses to give a plan and refused to contract for safety

## 2014-07-05 NOTE — ED Provider Notes (Signed)
Medical screening examination/treatment/procedure(s) were conducted as a shared visit with non-physician practitioner(s) and myself.  I personally evaluated the patient during the encounter.   EKG Interpretation None      Well appearing. No HI or SI. Mother is reliable and with pt, feels she is not a threat to herself or others. IVC reversal. Dc home. Mother and patient completely comfortable with plan   Hoy Morn, MD 07/05/14 [phone]

## 2014-07-11 ENCOUNTER — Encounter (HOSPITAL_COMMUNITY): Payer: Self-pay | Admitting: Emergency Medicine

## 2014-07-11 ENCOUNTER — Emergency Department (HOSPITAL_COMMUNITY)
Admission: EM | Admit: 2014-07-11 | Discharge: 2014-07-12 | Disposition: A | Discharge status: Home / Self Care | Payer: Medicare HMO | Attending: Emergency Medicine | Admitting: Emergency Medicine

## 2014-07-11 DIAGNOSIS — Z791 Long term (current) use of non-steroidal anti-inflammatories (NSAID): Secondary | ICD-10-CM | POA: Insufficient documentation

## 2014-07-11 DIAGNOSIS — F3130 Bipolar disorder, current episode depressed, mild or moderate severity, unspecified: Secondary | ICD-10-CM | POA: Insufficient documentation

## 2014-07-11 DIAGNOSIS — Z8719 Personal history of other diseases of the digestive system: Secondary | ICD-10-CM | POA: Diagnosis not present

## 2014-07-11 DIAGNOSIS — F411 Generalized anxiety disorder: Secondary | ICD-10-CM | POA: Insufficient documentation

## 2014-07-11 DIAGNOSIS — J45909 Unspecified asthma, uncomplicated: Secondary | ICD-10-CM | POA: Diagnosis not present

## 2014-07-11 DIAGNOSIS — Z79899 Other long term (current) drug therapy: Secondary | ICD-10-CM | POA: Insufficient documentation

## 2014-07-11 DIAGNOSIS — IMO0002 Reserved for concepts with insufficient information to code with codable children: Secondary | ICD-10-CM | POA: Diagnosis not present

## 2014-07-11 DIAGNOSIS — Z87440 Personal history of urinary (tract) infections: Secondary | ICD-10-CM | POA: Diagnosis not present

## 2014-07-11 DIAGNOSIS — R45851 Suicidal ideations: Secondary | ICD-10-CM | POA: Insufficient documentation

## 2014-07-11 DIAGNOSIS — Z3202 Encounter for pregnancy test, result negative: Secondary | ICD-10-CM | POA: Diagnosis not present

## 2014-07-11 DIAGNOSIS — F319 Bipolar disorder, unspecified: Secondary | ICD-10-CM

## 2014-07-11 LAB — COMPREHENSIVE METABOLIC PANEL
ALT: 20 U/L (ref 0–35)
AST: 15 U/L (ref 0–37)
Albumin: 3.7 g/dL (ref 3.5–5.2)
Alkaline Phosphatase: 100 U/L (ref 39–117)
Anion gap: 9 (ref 5–15)
BUN: 10 mg/dL (ref 6–23)
CO2: 28 mEq/L (ref 19–32)
Calcium: 9.1 mg/dL (ref 8.4–10.5)
Chloride: 101 mEq/L (ref 96–112)
Creatinine, Ser: 0.83 mg/dL (ref 0.50–1.10)
GFR calc Af Amer: >90 mL/min (ref >90)
GFR calc non Af Amer: >90 mL/min (ref >90)
Glucose, Bld: 84 mg/dL (ref 70–99)
Potassium: 5 mEq/L (ref 3.7–5.3)
Sodium: 138 mEq/L (ref 137–147)
Total Bilirubin: <0.2 mg/dL — ABNORMAL LOW (ref 0.3–1.2)
Total Protein: 7.4 g/dL (ref 6.0–8.3)

## 2014-07-11 LAB — CBC
HCT: 42.5 % (ref 36.0–46.0)
Hemoglobin: 13.8 g/dL (ref 12.0–15.0)
MCH: 30.9 pg (ref 26.0–34.0)
MCHC: 32.5 g/dL (ref 30.0–36.0)
MCV: 95.3 fL (ref 78.0–100.0)
Platelets: 197 10*3/uL (ref 150–400)
RBC: 4.46 MIL/uL (ref 3.87–5.11)
RDW: 12.4 % (ref 11.5–15.5)
WBC: 6.4 10*3/uL (ref 4.0–10.5)

## 2014-07-11 LAB — RAPID URINE DRUG SCREEN, HOSP PERFORMED
Amphetamines: NOT DETECTED
Barbiturates: NOT DETECTED
Benzodiazepines: NOT DETECTED
Cocaine: NOT DETECTED
Opiates: NOT DETECTED
Tetrahydrocannabinol: NOT DETECTED

## 2014-07-11 LAB — ETHANOL: Alcohol, Ethyl (B): <11 mg/dL (ref 0–11)

## 2014-07-11 LAB — ACETAMINOPHEN LEVEL: Acetaminophen (Tylenol), Serum: <15 ug/mL (ref 10–30)

## 2014-07-11 LAB — SALICYLATE LEVEL: Salicylate Lvl: <2 mg/dL — ABNORMAL LOW (ref 2.8–20.0)

## 2014-07-11 LAB — POC URINE PREG, ED: Preg Test, Ur: NEGATIVE

## 2014-07-11 MED ORDER — CITALOPRAM HYDROBROMIDE 10 MG PO TABS
40.0000 mg | ORAL_TABLET | Freq: Every day | ORAL | Status: DC
Start: 2014-07-11 — End: 2014-07-12
  Administered 2014-07-12: 40 mg via ORAL
  Filled 2014-07-11 (×2): qty 4

## 2014-07-11 MED ORDER — ZOLPIDEM TARTRATE 5 MG PO TABS: 5.0000 mg | ORAL_TABLET | Freq: Every evening | ORAL | Status: DC | PRN

## 2014-07-11 MED ORDER — CITALOPRAM HYDROBROMIDE 10 MG PO TABS
20.0000 mg | ORAL_TABLET | Freq: Every day | ORAL | Status: DC
  Filled 2014-07-11: qty 2

## 2014-07-11 MED ORDER — PREDNISONE 20 MG PO TABS
20.0000 mg | ORAL_TABLET | Freq: Two times a day (BID) | ORAL | Status: DC
  Filled 2014-07-11: qty 1

## 2014-07-11 MED ORDER — ONDANSETRON 4 MG PO TBDP: 8.0000 mg | ORAL_TABLET | Freq: Three times a day (TID) | ORAL | Status: DC | PRN

## 2014-07-11 MED ORDER — NAPROXEN 250 MG PO TABS
500.0000 mg | ORAL_TABLET | Freq: Two times a day (BID) | ORAL | Status: DC
  Filled 2014-07-11: qty 2

## 2014-07-11 MED ORDER — ACETAMINOPHEN 325 MG PO TABS: 650.0000 mg | ORAL_TABLET | ORAL | Status: DC | PRN

## 2014-07-11 MED ORDER — ALBUTEROL SULFATE HFA 108 (90 BASE) MCG/ACT IN AERS: 2.0000 | INHALATION_SPRAY | Freq: Four times a day (QID) | RESPIRATORY_TRACT | Status: DC | PRN

## 2014-07-11 MED ORDER — CARBAMAZEPINE ER 400 MG PO TB12
400.0000 mg | ORAL_TABLET | ORAL | Status: DC
  Filled 2014-07-11: qty 1

## 2014-07-11 MED ORDER — LORAZEPAM 1 MG PO TABS
0.5000 mg | ORAL_TABLET | Freq: Three times a day (TID) | ORAL | Status: DC
  Administered 2014-07-11 – 2014-07-12 (×3): 0.5 mg via ORAL
  Filled 2014-07-11 (×3): qty 1

## 2014-07-11 MED ORDER — CARBAMAZEPINE ER 400 MG PO TB12
400.0000 mg | ORAL_TABLET | Freq: Two times a day (BID) | ORAL | Status: DC
  Administered 2014-07-11 – 2014-07-12 (×2): 400 mg via ORAL
  Filled 2014-07-11 (×3): qty 1

## 2014-07-11 MED ORDER — ALBUTEROL SULFATE (2.5 MG/3ML) 0.083% IN NEBU: 2.5000 mg | INHALATION_SOLUTION | Freq: Four times a day (QID) | RESPIRATORY_TRACT | Status: DC | PRN

## 2014-07-11 MED ORDER — TRAZODONE HCL 50 MG PO TABS
300.0000 mg | ORAL_TABLET | Freq: Every day | ORAL | Status: DC
  Administered 2014-07-11: 300 mg via ORAL
  Filled 2014-07-11: qty 6

## 2014-07-11 NOTE — ED Notes (Signed)
Advised pt urine sample needs to be collected.  Sitter will advise when obtained.

## 2014-07-11 NOTE — ED Notes (Signed)
Pt in paper scrubs, wanded by security and called for a sitter

## 2014-07-11 NOTE — ED Notes (Signed)
Meal tray ordered for patient.

## 2014-07-11 NOTE — ED Provider Notes (Signed)
CSN: [id number]     Arrival date & time 07/11/14  1148 History   First MD Initiated Contact with Patient 07/11/14 1305     Chief Complaint  Patient presents with  . Suicidal  . Medical Clearance     HPI Patient presents emergency department complaining of suicidal thoughts.  She thought about hanging herself today.  She has a long-standing history of depression.  She was seen emergency room 5 days ago with similar complaints but between her and her mom and outpatient plan was arranged.  She states she did well for several days but then began having suicidal thoughts again.  No attempts today.  Compliant with medications.  No other complaints.   Past Medical History  Diagnosis Date  . Bipolar disorder   . Attention deficit hyperactivity disorder   . Depression   . Anxiety   . Asthma   . UTI (urinary tract infection)     Completed Cipro 08/06/12  . Gastritis    Past Surgical History  Procedure Laterality Date  . Mouth surgery    . Eye muscle surgery Bilateral     07/29/12   Family History  Problem Relation Age of Onset  . Depression Mother   . Depression Father   . Depression Brother   . Colon cancer Mother   . Colon polyps Mother   . Liver disease    . Kidney disease     History  Substance Use Topics  . Smoking status: Never Smoker   . Smokeless tobacco: Never Used  . Alcohol Use: No   OB History   Grav Para Term Preterm Abortions TAB SAB Ect Mult Living   0 0             Review of Systems  All other systems reviewed and are negative.     Allergies  Depakote; Methylphenidate derivatives; Neurontin; and Prozac  Home Medications   Prior to Admission medications   Medication Sig Start Date End Date Taking? Authorizing Provider  albuterol (PROVENTIL HFA;VENTOLIN HFA) 108 (90 BASE) MCG/ACT inhaler Inhale 2 puffs into the lungs every 6 (six) hours as needed for wheezing or shortness of breath.    Historical Provider, MD  carbamazepine (TEGRETOL XR) 400 MG 12 hr  tablet Take 1 tablet (400 mg total) by mouth 2 (two) times daily. 02/14/14   Elmarie Shiley, NP  citalopram (CELEXA) 20 MG tablet Take 1 tablet (20 mg total) by mouth daily. 02/14/14   Elmarie Shiley, NP  citalopram (CELEXA) 40 MG tablet Take 40 mg by mouth daily. To stop the 20 mg celexa after today 03/27/14 03/28/14   Historical Provider, MD  ibuprofen (ADVIL,MOTRIN) 200 MG tablet Take 13,800 mg by mouth once.    Historical Provider, MD  LORazepam (ATIVAN) 0.5 MG tablet Take 0.5 mg by mouth 3 (three) times daily.    Historical Provider, MD  naproxen (NAPROSYN) 500 MG tablet Take 1 tablet (500 mg total) by mouth 2 (two) times daily. 05/13/14   Harden Mo, MD  ondansetron (ZOFRAN ODT) 8 MG disintegrating tablet Take 1 tablet (8 mg total) by mouth every 8 (eight) hours as needed for nausea. 05/13/14   Harden Mo, MD  predniSONE (DELTASONE) 20 MG tablet Take 1 tablet (20 mg total) by mouth 2 (two) times daily. 05/13/14   Harden Mo, MD  traZODone (DESYREL) 300 MG tablet Take 1 tablet (300 mg total) by mouth at bedtime. 02/14/14   Elmarie Shiley, NP   BP 125/50  Pulse 96  Temp(Src) 98.6 F (37 C) (Oral)  Resp 18  SpO2 97% Physical Exam  Nursing note and vitals reviewed. Constitutional: She is oriented to person, place, and time. She appears well-developed and well-nourished. No distress.  HENT:  Head: Normocephalic and atraumatic.  Eyes: EOM are normal.  Neck: Normal range of motion.  Cardiovascular: Normal rate, regular rhythm and normal heart sounds.   Pulmonary/Chest: Effort normal and breath sounds normal.  Abdominal: Soft. She exhibits no distension. There is no tenderness.  Musculoskeletal: Normal range of motion.  Neurological: She is alert and oriented to person, place, and time.  Skin: Skin is warm and dry.  Psychiatric: She has a normal mood and affect. Judgment normal.  Suicidal thoughts with plan.  No homicidal ideation    ED Course  Procedures (including critical care time) Labs  Review Labs Reviewed  CBC  ACETAMINOPHEN LEVEL  COMPREHENSIVE METABOLIC PANEL  ETHANOL  SALICYLATE LEVEL  URINE RAPID DRUG SCREEN (HOSP PERFORMED)  POC URINE PREG, ED    Imaging Review No results found.   EKG Interpretation None      MDM   Final diagnoses:  None    Patient here voluntarily.  Suicidal thoughts with a plan.  No attempts.  TTS to evaluate    Hoy Morn, MD 07/11/14 1340

## 2014-07-11 NOTE — BH Assessment (Addendum)
[place] Assessment Progress Note  At the request of Ava Johnnye Sima, TS this writer called [facility] to update them on this pt's dispositional status.  Ava reports that pt has been staffed with Catalina Pizza, NP, who determined that pt would benefit from psychiatric hospitalization at this time.  However, no appropriate beds are currently available.  TTS is to seek alternative placement.  I spoke to EDP Dr Tawnya Crook, as well as pt's nurse to notify them.  Jalene Mullet, MA Triage Specialist 07/11/2014 @ 17:47
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
## 2014-07-11 NOTE — ED Notes (Signed)
Arrived to room 3 in [place] [employer] sitter present

## 2014-07-11 NOTE — ED Notes (Signed)
Pt reports having SI, denies any attempts. Calm and cooperative at triage.

## 2014-07-12 DIAGNOSIS — F3130 Bipolar disorder, current episode depressed, mild or moderate severity, unspecified: Secondary | ICD-10-CM

## 2014-07-12 NOTE — ED Notes (Signed)
Patient is resting comfortably, with sitter at the beside.
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
## 2014-07-12 NOTE — ED Notes (Signed)
Pt up and amb to the phone and to the bathroom with sitter.

## 2014-07-12 NOTE — ED Notes (Signed)
Patient is resting comfortably, with sitter at the bedside. 

## 2014-07-12 NOTE — ED Notes (Signed)
Pt going to take a shower at this time.

## 2014-07-12 NOTE — Discharge Instructions (Signed)
Suicidal Feelings, How to Help Yourself Everyone feels sad or unhappy at times, but depressing thoughts and feelings of hopelessness can lead to thoughts of suicide. It can seem as if life is too tough to handle. If you feel as though you have reached the point where suicide is the only answer, it is time to let someone know immediately.  HOW TO COPE AND PREVENT SUICIDE  Let family, friends, teachers, or counselors know. Get help. Try not to isolate yourself from those who care about you. Even though you may not feel sociable, talk with someone every day. It is best if it is face-to-face. Remember, they will want to help you.  Eat a regularly spaced and well-balanced diet.  Get plenty of rest.  Avoid alcohol and drugs because they will only make you feel worse and may also lower your inhibitions. Remove them from the home. If you are thinking of taking an overdose of your prescribed medicines, give your medicines to someone who can give them to you one day at a time. If you are on antidepressants, let your caregiver know of your feelings so he or she can provide a safer medicine, if that is a concern.  Remove weapons or poisons from your home.  Try to stick to routines. Follow a schedule and remind yourself that you have to keep that schedule every day.  Set some realistic goals and achieve them. Make a list and cross things off as you go. Accomplishments give a sense of worth. Wait until you are feeling better before doing things you find difficult or unpleasant to do.  If you are able, try to start exercising. Even half-hour periods of exercise each day will make you feel better. Getting out in the sun or into nature helps you recover from depression faster. If you have a favorite place to walk, take advantage of that.  Increase safe activities that have always given you pleasure. This may include playing your favorite music, reading a good book, painting a picture, or playing your favorite  instrument. Do whatever takes your mind off your depression.  Keep your living space well-lighted. GET HELP Contact a suicide hotline, crisis center, or local suicide prevention center for help right away. Local centers may include a hospital, clinic, community service organization, social service provider, or health department.  Call your local emergency services (911 in the [place]).  Call a suicide hotline:  1-800-273-TALK (1-534-333-5280) in the [place].  1-800-SUICIDE 959-608-4095) in the [place].  ([phone] in the [place] for Spanish-speaking counselors.  2-376-283-1DVV [phone]) in the [place] for TTY users.  Visit the following websites for information and help:  National Suicide Prevention Lifeline: www.suicidepreventionlifeline.org  Hopeline: www.hopeline.[place] for Suicide Prevention: PromotionalLoans.co.za  For lesbian, gay, bisexual, transgender, or questioning youth, contact [employer]:  6-948-5-I-OEVOJJ (980)742-1176) in the [place].  www.thetrevorproject.org  In [place], treatment resources are listed in each [place] with listings available under USAA for [employer] or similar titles. Another source for Crisis Centres by [place] is located at http://www.suicideprevention.ca/in-crisis-now/find-a-crisis-centre-now/crisis-centres Document Released: 06/07/2003 Document Revised: 02/23/2012 Document Reviewed: 03/28/2014 [facility] Patient Information 2015 [place], [place]. This information is not intended to replace advice given to you by your health care provider. Make sure you discuss any questions you have with your health care provider.   Emergency Department Resource Guide 1) Find a Doctor and Pay Out of Pocket Although you won't have to find out who is covered by your insurance  plan, it is a good idea to ask around and get recommendations. You will then need to call the office and  see if the doctor you have chosen will accept you as a new patient and what types of options they offer for patients who are self-pay. Some doctors offer discounts or will set up payment plans for their patients who do not have insurance, but you will need to ask so you aren't surprised when you get to your appointment.  2) Contact Your Local Health Department Not all health departments have doctors that can see patients for sick visits, but many do, so it is worth a call to see if yours does. If you don't know where your local health department is, you can check in your phone book. The CDC also has a tool to help you locate your state's health department, and many state websites also have listings of all of their local health departments.  3) Find a [facility] [place] If your illness is not likely to be very severe or complicated, you may want to try a walk in clinic. These are popping up all over the country in pharmacies, drugstores, and shopping centers. They're usually staffed by nurse practitioners or physician assistants that have been trained to treat common illnesses and complaints. They're usually fairly quick and inexpensive. However, if you have serious medical issues or chronic medical problems, these are probably not your best option.  No Primary Care Doctor: - Call Health Connect at  [phone] - they can help you locate a primary care doctor that  accepts your insurance, provides certain services, etc. - Physician Referral Service- [phone]  Chronic Pain Problems: Organization         Address  Phone   Notes  [facility]  [phone] Patients need to be referred by their primary care doctor.   Medication Assistance: Organization         Address  Phone   Notes  [facility] Medication [facility] [address] [place], [place] [address] [phone] --Must be a resident of [facility] -- Must have NO insurance coverage whatsoever  (no Medicaid/ Medicare, etc.) -- The pt. MUST have a primary care doctor that directs their care regularly and follows them in the community   MedAssist  [phone]   [employer]  [phone]    Agencies that provide inexpensive medical care: Organization         Address  Phone   Notes  [place]  [phone]   [facility]    [phone]   [facility] [place], [place] [address] [phone]   [place] [address] [place] [phone]   Planned Parenthood    [phone]   [facility]    [phone]   [place] and [place] [address], [place] Phone:  [phone], Fax:  [phone] Hours of Operation:  9 am - 6 pm, M-F.  Also accepts Medicaid/Medicare and self-pay.  [facility] for [place], [address],  Phone: [phone], Fax: [phone]. Hours of Operation:  8:30 am - 5:30 pm, M-F.  Also accepts Medicaid and self-pay.  [facility] [place] [address], [place] Phone: [phone]   [place], [place], [place] [phone], Ext. [phone] Mondays & Thursdays:  7-9 AM.  First 15 patients are seen on a first come, first serve basis.    [place] Providers:  Organization         Address  Phone   Notes  [facility] [address], [place] [phone] Also accepts self-pay patients.  [facility] [address], [place]  [phone]   [place], [address], [place] [phone]   [facility] [address], [place] [phone]   Lucianne Lei [address], [place]   [phone] Only accepts [place] Access [place] patients after they have their name applied to their  card.   Self-Pay (no insurance) in [facility]:  Organization         Address  Phone   Notes  Sickle Cell Patients, [facility] [place] [phone]   [facility] [place] [phone]   [facility] [place]  [place], [place], [place] [phone]   [facility]/Dr. Osei-Bonsu  [address], [place] [address], [place] [phone] Phone number for both [place] and [place] locations is the same.  Urgent Medical and [facility] [address] [place] [phone]   [facility] [address] [place] or [address] [phone]   [facility] [address] [place] [phone], phone; [phone], fax Sees patients 1st and 3rd Saturday of every month.  Must not qualify for public or private insurance (i.e. Medicaid, Medicare, Hughes Health Choice, Veterans' Benefits)  Household income should be no more than 200% of the poverty level The clinic cannot treat you if you are pregnant or think you are pregnant  Sexually transmitted diseases are not treated at the clinic.    Dental Care: Organization         Address  Phone  Notes  [facility] Department of [facility] [place] [phone] Accepts children up to age 105 who are enrolled in [place] or [place]; pregnant women with a Medicaid card; and children who have applied for Medicaid or [place] Health Choice, but were declined, whose parents can pay a reduced fee at time of service.  [facility] Department of [facility]  [address], [place] [phone] Accepts children up to age 2 who are enrolled in [place] or [place]; pregnant women with a Medicaid card; and children who have applied for Medicaid or [place] Health  Choice, but were declined, whose parents can pay a reduced fee at time of service.  [place] Adult [facility] Access PROGRAM  [place] [phone] Patients are seen by appointment only. Walk-ins are not accepted. [place] will see patients 58 years of age and older. Monday - Tuesday (8am-5pm) Most Wednesdays (8:30-5pm) $30 per visit, cash only  [facility] Adult Dental Access PROGRAM  [address], [facility] [phone] Patients are seen by appointment only. Walk-ins are not accepted. [place] will see patients 83 years of age and older. One Wednesday Evening (Monthly: Volunteer Based).  $30 per visit, cash only  [place]  [phone] for adults; Children under age  4, call Graduate Pediatric Dentistry at [phone]. Children aged 19-14, please call [phone] to request a pediatric application.  Dental services are provided in all areas of dental care including fillings, crowns and bridges, complete and partial dentures, implants, gum treatment, root canals, and extractions. Preventive care is also provided. Treatment is provided to both adults and children. Patients are selected via a lottery and there is often a waiting list.   [facility] [address], [place]  [phone] www.drcivils.com   [facility] [address] [place], [place] [phone], Ext. [phone] Second and Fourth Thursday of each month, opens at 6:30 AM; Clinic ends at 9 AM.  Patients are seen on a first-come first-served basis, and a limited number are seen during each clinic.   [facility]  [address] Hillard Danker [place], [place] [phone]   Eligibility Requirements You must have lived in [place], [place], or [place] counties for at least the last three months.   You cannot be eligible for state or federal sponsored [employer], including [employer], [place], or [employer].   You  generally cannot be eligible for healthcare insurance through your employer.    How to apply: Eligibility screenings are held every Tuesday and Wednesday afternoon from 1:00 pm until 4:00 pm. You do not need an appointment for the interview!  [facility] [address], [place], [place]   [place]  [place] Department  [place]  [phone]    Behavioral Health Resources in the Community: Intensive Outpatient Programs Organization         Address  Phone  Notes  [place]. [address] [place], [place] [phone]   [facility] [address], [place], [place]   ADS: Alcohol & Drug Svcs [address] [place], [place]   [place] [address]  [place], [place] or [phone]   Substance Abuse Resources Organization         Address  Phone  Notes  Alcohol and Drug Services  [phone]   [place]  [phone]   [facility] [place]   Chinita Pester  [phone]   Residential & Outpatient Substance Abuse Program  [phone]   Psychological Services Organization         Address  Phone  Notes  [facility] [place]  [place]  [phone]   [place] [address], [place] or [phone]    Mobile Crisis Teams Organization         Address  Phone  Notes  [facility], Mobile Crisis Care Unit  [phone]   Assertive Psychotherapeutic Services  [address] [place], [place]   [place] [address], [place] [phone]    Self-Help/Support Groups Organization         Address  Phone             Notes  [place]. of [place] - variety of support groups  [place] Call for  more information  Narcotics Anonymous (NA), Caring Services [address], [employer] [place]  2 meetings at this location   Brewing technologist  Notes  ASAP Residential Treatment [phone] [place]  Mardene Speak [place]  [place]  [address] [place] 312811, [place], [place]   [place], [place] [phone] Admissions: 8am-3pm M-F  Incentives Substance [place] [address],    [place], [place] [phone]   [facility] [address] [place], [place], [place]   [facility] [address],  [place], [place]   [facility]ight Programs - Intensive Outpatient [address] Kristeen [address], [place], [place]   [facility] [place].,  [place], [place] [phone] or [phone]   [facility]idential Treatment Services (RTS) [address] [place], [place] Accepts Medicaid  [place] [address],  [place] [phone] Substance Abuse/Addiction Treatment   [facility] Organization         Address  Phone  Notes  [facility]  [phone]   Domenic Schwab, PhD [address] [place], [place]   [phone] or [phone]   [place] [address] [place], [place] [phone]   [facility] [address] [place], [place] [phone] Insurance/Medicaid/sponsorship through [facility] and Families [address] [place]                                    [place], [place] [phone] [place] [address] [place], [place] [phone]    Dr. Adele Schilder  [phone]   [facility] Dept. 1) [address], [place] 2) [place] 3)  [place], [place] [phone]  [phone]   [place] [phone] or [phone] (After Hours)

## 2014-07-12 NOTE — ED Notes (Signed)
Pt decided to wait and take a shower at home.

## 2014-08-01 ENCOUNTER — Encounter (HOSPITAL_COMMUNITY): Payer: Self-pay | Admitting: Emergency Medicine

## 2014-08-01 ENCOUNTER — Emergency Department (HOSPITAL_COMMUNITY)
Admission: EM | Admit: 2014-08-01 | Discharge: 2014-08-02 | Disposition: A | Discharge status: Home / Self Care | Payer: Medicare HMO | Attending: Emergency Medicine | Admitting: Emergency Medicine

## 2014-08-01 DIAGNOSIS — Z79899 Other long term (current) drug therapy: Secondary | ICD-10-CM | POA: Insufficient documentation

## 2014-08-01 DIAGNOSIS — F411 Generalized anxiety disorder: Secondary | ICD-10-CM | POA: Diagnosis not present

## 2014-08-01 DIAGNOSIS — R05 Cough: Secondary | ICD-10-CM | POA: Diagnosis not present

## 2014-08-01 DIAGNOSIS — F319 Bipolar disorder, unspecified: Secondary | ICD-10-CM | POA: Insufficient documentation

## 2014-08-01 DIAGNOSIS — F329 Major depressive disorder, single episode, unspecified: Secondary | ICD-10-CM

## 2014-08-01 DIAGNOSIS — J45909 Unspecified asthma, uncomplicated: Secondary | ICD-10-CM | POA: Insufficient documentation

## 2014-08-01 DIAGNOSIS — Z87440 Personal history of urinary (tract) infections: Secondary | ICD-10-CM | POA: Diagnosis not present

## 2014-08-01 DIAGNOSIS — R45851 Suicidal ideations: Secondary | ICD-10-CM | POA: Insufficient documentation

## 2014-08-01 DIAGNOSIS — Z8719 Personal history of other diseases of the digestive system: Secondary | ICD-10-CM | POA: Insufficient documentation

## 2014-08-01 DIAGNOSIS — R059 Cough, unspecified: Secondary | ICD-10-CM | POA: Insufficient documentation

## 2014-08-01 DIAGNOSIS — F32A Depression, unspecified: Secondary | ICD-10-CM

## 2014-08-01 LAB — COMPREHENSIVE METABOLIC PANEL
ALT: 20 U/L (ref 0–35)
AST: 18 U/L (ref 0–37)
Albumin: 3.8 g/dL (ref 3.5–5.2)
Alkaline Phosphatase: 102 U/L (ref 39–117)
Anion gap: 15 (ref 5–15)
BUN: 11 mg/dL (ref 6–23)
CO2: 22 mEq/L (ref 19–32)
Calcium: 9.4 mg/dL (ref 8.4–10.5)
Chloride: 100 mEq/L (ref 96–112)
Creatinine, Ser: 0.8 mg/dL (ref 0.50–1.10)
GFR calc Af Amer: >90 mL/min (ref >90)
GFR calc non Af Amer: >90 mL/min (ref >90)
Glucose, Bld: 96 mg/dL (ref 70–99)
Potassium: 4.1 mEq/L (ref 3.7–5.3)
Sodium: 137 mEq/L (ref 137–147)
Total Bilirubin: 0.2 mg/dL — ABNORMAL LOW (ref 0.3–1.2)
Total Protein: 8.1 g/dL (ref 6.0–8.3)

## 2014-08-01 LAB — RAPID URINE DRUG SCREEN, HOSP PERFORMED
Amphetamines: NOT DETECTED
Barbiturates: NOT DETECTED
Benzodiazepines: NOT DETECTED
Cocaine: NOT DETECTED
Opiates: NOT DETECTED
Tetrahydrocannabinol: NOT DETECTED

## 2014-08-01 LAB — CBC
HCT: 43.3 % (ref 36.0–46.0)
Hemoglobin: 14.5 g/dL (ref 12.0–15.0)
MCH: 31.5 pg (ref 26.0–34.0)
MCHC: 33.5 g/dL (ref 30.0–36.0)
MCV: 93.9 fL (ref 78.0–100.0)
Platelets: 217 10*3/uL (ref 150–400)
RBC: 4.61 MIL/uL (ref 3.87–5.11)
RDW: 12.3 % (ref 11.5–15.5)
WBC: 7.5 10*3/uL (ref 4.0–10.5)

## 2014-08-01 LAB — ACETAMINOPHEN LEVEL: Acetaminophen (Tylenol), Serum: <15 ug/mL (ref 10–30)

## 2014-08-01 LAB — SALICYLATE LEVEL: Salicylate Lvl: <2 mg/dL — ABNORMAL LOW (ref 2.8–20.0)

## 2014-08-01 LAB — ETHANOL: Alcohol, Ethyl (B): <11 mg/dL (ref 0–11)

## 2014-08-01 MED ORDER — TRAZODONE HCL 100 MG PO TABS
300.0000 mg | ORAL_TABLET | Freq: Every day | ORAL | Status: DC
  Administered 2014-08-01: 300 mg via ORAL
  Filled 2014-08-01: qty 3

## 2014-08-01 MED ORDER — LORAZEPAM 0.5 MG PO TABS
0.5000 mg | ORAL_TABLET | Freq: Two times a day (BID) | ORAL | Status: DC
  Administered 2014-08-01: 0.5 mg via ORAL
  Filled 2014-08-01: qty 1

## 2014-08-01 MED ORDER — ASENAPINE MALEATE 5 MG SL SUBL
5.0000 mg | SUBLINGUAL_TABLET | Freq: Every day | SUBLINGUAL | Status: DC
  Administered 2014-08-01: 5 mg via SUBLINGUAL
  Filled 2014-08-01 (×2): qty 1

## 2014-08-01 MED ORDER — ALBUTEROL SULFATE HFA 108 (90 BASE) MCG/ACT IN AERS: 2.0000 | INHALATION_SPRAY | Freq: Four times a day (QID) | RESPIRATORY_TRACT | Status: DC | PRN

## 2014-08-01 MED ORDER — CITALOPRAM HYDROBROMIDE 40 MG PO TABS
40.0000 mg | ORAL_TABLET | Freq: Every day | ORAL | Status: DC
Start: 2014-08-02 — End: 2014-08-02
  Filled 2014-08-01: qty 1

## 2014-08-01 MED ORDER — CARBAMAZEPINE ER 400 MG PO TB12
400.0000 mg | ORAL_TABLET | Freq: Two times a day (BID) | ORAL | Status: DC
  Administered 2014-08-01: 400 mg via ORAL
  Filled 2014-08-01 (×3): qty 1

## 2014-08-01 NOTE — ED Notes (Signed)
Per patient she voided earlier, unable to void at this time.

## 2014-08-01 NOTE — ED Notes (Signed)
Pt c/o SI w/o plan, pt voluntary brought in by sheriff.

## 2014-08-01 NOTE — ED Provider Notes (Signed)
CSN: [id number]     Arrival date & time 08/01/14  1853 History   First MD Initiated Contact with Patient 08/01/14 1928     Chief Complaint  Patient presents with  . Suicidal  . Medical Clearance     (Consider location/radiation/quality/duration/timing/severity/associated sxs/prior Treatment) HPI Comments:  Patient presents with depression. She has a history of bipolar disorder and has had frequent visits to the ED for depression and suicidal ideations. She does say that she has not been taking her medications regularly. She's had some worsening depression of the last 2-3 days. She denies any suicidal ideations. She states she just feels like she needs to talk to somebody. She denies any physical complaints or recent illnesses. She denies alcohol or drug use.   Past Medical History  Diagnosis Date  . Bipolar disorder   . Attention deficit hyperactivity disorder   . Depression   . Anxiety   . Asthma   . UTI (urinary tract infection)     Completed Cipro 08/06/12  . Gastritis    Past Surgical History  Procedure Laterality Date  . Mouth surgery    . Eye muscle surgery Bilateral     07/29/12   Family History  Problem Relation Age of Onset  . Depression Mother   . Depression Father   . Depression Brother   . Colon cancer Mother   . Colon polyps Mother   . Liver disease    . Kidney disease     History  Substance Use Topics  . Smoking status: Never Smoker   . Smokeless tobacco: Never Used  . Alcohol Use: No   OB History   Grav Para Term Preterm Abortions TAB SAB Ect Mult Living   0 0             Review of Systems  Constitutional: Negative for fever, chills, diaphoresis and fatigue.  HENT: Negative for congestion, rhinorrhea and sneezing.   Eyes: Negative.   Respiratory: Positive for cough. Negative for chest tightness and shortness of breath.   Cardiovascular: Negative for chest pain and leg swelling.  Gastrointestinal: Negative for nausea, vomiting, abdominal pain,  diarrhea and blood in stool.  Genitourinary: Negative for frequency, hematuria, flank pain and difficulty urinating.  Musculoskeletal: Negative for arthralgias and back pain.  Skin: Negative for rash.  Neurological: Negative for dizziness, speech difficulty, weakness, numbness and headaches.  Psychiatric/Behavioral: Positive for dysphoric mood. Negative for suicidal ideas, sleep disturbance and self-injury.      Allergies  Depakote; Methylphenidate derivatives; Neurontin; and Prozac  Home Medications   Prior to Admission medications   Medication Sig Start Date End Date Taking? Authorizing Provider  albuterol (PROVENTIL HFA;VENTOLIN HFA) 108 (90 BASE) MCG/ACT inhaler Inhale 2 puffs into the lungs every 6 (six) hours as needed for wheezing or shortness of breath.   Yes Historical Provider, MD  asenapine (SAPHRIS) 5 MG SUBL 24 hr tablet Place 5 mg under the tongue at bedtime.   Yes Historical Provider, MD  carbamazepine (TEGRETOL XR) 400 MG 12 hr tablet Take 1 tablet (400 mg total) by mouth 2 (two) times daily. 02/14/14  Yes Elmarie Shiley, NP  citalopram (CELEXA) 40 MG tablet Take 40 mg by mouth every morning.  03/28/14  Yes Historical Provider, MD  LORazepam (ATIVAN) 0.5 MG tablet Take 0.5 mg by mouth 2 (two) times daily. Takes 1 tablet in the morning and 2 tablets at night   Yes Historical Provider, MD  traZODone (DESYREL) 300 MG tablet Take 1 tablet (300  mg total) by mouth at bedtime. 02/14/14  Yes Elmarie Shiley, NP  etonogestrel (IMPLANON) 68 MG IMPL implant Inject 1 each into the skin once.    Historical Provider, MD   BP 120/82  Pulse 71  Temp(Src) 98.1 F (36.7 C) (Oral)  Resp 18  SpO2 100% Physical Exam  Constitutional: She is oriented to person, place, and time. She appears well-developed and well-nourished.  HENT:  Head: Normocephalic and atraumatic.  Eyes: Pupils are equal, round, and reactive to light.  Neck: Normal range of motion. Neck supple.  Cardiovascular: Normal rate,  regular rhythm and normal heart sounds.   Pulmonary/Chest: Effort normal and breath sounds normal. No respiratory distress. She has no wheezes. She has no rales. She exhibits no tenderness.  Abdominal: Soft. Bowel sounds are normal. There is no tenderness. There is no rebound and no guarding.  Musculoskeletal: Normal range of motion. She exhibits no edema.  Lymphadenopathy:    She has no cervical adenopathy.  Neurological: She is alert and oriented to person, place, and time.  Skin: Skin is warm and dry. No rash noted.  Psychiatric: She has a normal mood and affect.    ED Course  Procedures (including critical care time) Labs Review Results for orders placed during the hospital encounter of 08/01/14  ACETAMINOPHEN LEVEL      Result Value Ref Range   Acetaminophen (Tylenol), Serum <15.0  10 - 30 ug/mL  CBC      Result Value Ref Range   WBC 7.5  4.0 - 10.5 K/uL   RBC 4.61  3.87 - 5.11 MIL/uL   Hemoglobin 14.5  12.0 - 15.0 g/dL   HCT 43.3  36.0 - 46.0 %   MCV 93.9  78.0 - 100.0 fL   MCH 31.5  26.0 - 34.0 pg   MCHC 33.5  30.0 - 36.0 g/dL   RDW 12.3  11.5 - 15.5 %   Platelets 217  150 - 400 K/uL  COMPREHENSIVE METABOLIC PANEL      Result Value Ref Range   Sodium 137  137 - 147 mEq/L   Potassium 4.1  3.7 - 5.3 mEq/L   Chloride 100  96 - 112 mEq/L   CO2 22  19 - 32 mEq/L   Glucose, Bld 96  70 - 99 mg/dL   BUN 11  6 - 23 mg/dL   Creatinine, Ser 0.80  0.50 - 1.10 mg/dL   Calcium 9.4  8.4 - 10.5 mg/dL   Total Protein 8.1  6.0 - 8.3 g/dL   Albumin 3.8  3.5 - 5.2 g/dL   AST 18  0 - 37 U/L   ALT 20  0 - 35 U/L   Alkaline Phosphatase 102  39 - 117 U/L   Total Bilirubin 0.2 (*) 0.3 - 1.2 mg/dL   GFR calc non Af Amer >90  >90 mL/min   GFR calc Af Amer >90  >90 mL/min   Anion gap 15  5 - 15  ETHANOL      Result Value Ref Range   Alcohol, Ethyl (B) <11  0 - 11 mg/dL  SALICYLATE LEVEL      Result Value Ref Range   Salicylate Lvl <7.3 (*) 2.8 - 20.0 mg/dL  URINE RAPID DRUG SCREEN  (HOSP PERFORMED)      Result Value Ref Range   Opiates NONE DETECTED  NONE DETECTED   Cocaine NONE DETECTED  NONE DETECTED   Benzodiazepines NONE DETECTED  NONE DETECTED   Amphetamines NONE DETECTED  NONE DETECTED   Tetrahydrocannabinol NONE DETECTED  NONE DETECTED   Barbiturates NONE DETECTED  NONE DETECTED   No results found.    Imaging Review No results found.   EKG Interpretation None      MDM   Final diagnoses:  Depression    Patient presents with worsening depression. She does deny any suicidal ideations and is here voluntarily. She's currently awaiting behavioral health assessment.    Malvin Johns, MD 08/01/14 [phone]

## 2014-08-02 DIAGNOSIS — F319 Bipolar disorder, unspecified: Secondary | ICD-10-CM | POA: Diagnosis not present

## 2014-08-02 NOTE — ED Notes (Signed)
Mother reported that it will be an hour before she can pick patient up. On coming nurse informed.

## 2014-08-02 NOTE — BH Assessment (Signed)
Assessment Note  [name] is an 28 y.o. female.  -Clinician spoke to Dr. Tamera Punt at [facility] about need for TTS.  Patient has been having worseniing depression over the last 2-3 days.  Denies SI, just wanted to talk to someone.  Patient had contacted [employer] reporting that she was having suicidal thoughts.  When she was interviewed by this clinician she denies SI, HI or A/V hallucinations.  Patient actually denies any current depressive symptoms.  She says that her anxiety level is low.  Patient currently lives with her mother and takes care of her.  Patient says that she sees Dr. Sheppard Evens for outpatient psychiatry at [facility] outpatient.  In an earlier assessment she noted someone else as her psychiatrist and that she had Suzzanne Cloud as her therapist.  Today she says that she does not have a therapist.  Pt does not appear to be meeting inpatient psychiatric care criteria at this time.  -Patient care discussed with Patriciaann Clan, PA.  He recommended getting a no harm contract and giving patient a list of referrals for outpatient therapists.  This was done.  Patient's mother will arrange to get her picked up from [facility] in the AM.  Axis I: Bipolar, mixed Axis II: Deferred Axis III:  Past Medical History  Diagnosis Date  . Bipolar disorder   . Attention deficit hyperactivity disorder   . Depression   . Anxiety   . Asthma   . UTI (urinary tract infection)     Completed Cipro 08/06/12  . Gastritis    Axis IV: other psychosocial or environmental problems Axis V: 61-70 mild symptoms  Past Medical History:  Past Medical History  Diagnosis Date  . Bipolar disorder   . Attention deficit hyperactivity disorder   . Depression   . Anxiety   . Asthma   . UTI (urinary tract infection)     Completed Cipro 08/06/12  . Gastritis     Past Surgical History  Procedure Laterality Date  . Mouth surgery    . Eye muscle surgery Bilateral     07/29/12    Family History:  Family History   Problem Relation Age of Onset  . Depression Mother   . Depression Father   . Depression Brother   . Colon cancer Mother   . Colon polyps Mother   . Liver disease    . Kidney disease      Social History:  reports that she has never smoked. She has never used smokeless tobacco. She reports that she does not drink alcohol or use illicit drugs.  Additional Social History:  Alcohol / Drug Use Pain Medications: See PTA medication list Prescriptions: Tegretol, Saffris, Ativan, Trazadone Over the Counter: N/a History of alcohol / drug use?: No history of alcohol / drug abuse  CIWA: CIWA-Ar BP: 113/67 mmHg Pulse Rate: 84 COWS:    Allergies:  Allergies  Allergen Reactions  . Depakote [Divalproex Sodium] Other (See Comments)    Pt had h/o intolerance from Depakote. Stated that she never wants this med."Blood level was toxic"  . Methylphenidate Derivatives Other (See Comments)    Gets depressed and lots of anger  . Neurontin [Gabapentin] Other (See Comments)    Pt reports extreme dizziness  . Prozac [Fluoxetine Hcl] Other (See Comments)    Reaction: anger    Home Medications:  (Not in a hospital admission)  OB/GYN Status:  No LMP recorded. Patient has had an implant.  General Assessment Data [facility] of Assessment: [facility]  ED Is this a Tele or Face-to-Face Assessment?: Face-to-Face Is this an Initial Assessment or a Re-assessment for this encounter?: Initial Assessment Living Arrangements: Parent;Other relatives Can pt return to current living arrangement?: Yes Admission Status: Voluntary Is patient capable of signing voluntary admission?: Yes Transfer from: [facility] Referral Source: Self/Family/Friend     [place] Living Arrangements: Parent;Other relatives Name of Psychiatrist: Dr Sheppard Evens at [facility] outpatient Name of Therapist: None     Risk to self with the past 6 months Suicidal Ideation: No Suicidal Intent: No Is patient at risk for  suicide?: No Suicidal Plan?: No Access to Means: No What has been your use of drugs/alcohol within the last 12 months?: Pt denies Previous Attempts/Gestures: Yes How many times?: 3 Other Self Harm Risks: None reported Triggers for Past Attempts: None known Intentional Self Injurious Behavior: None Family Suicide History: No Recent stressful life event(s): Other (Comment) (Pt cannot name a recent stressor) Persecutory voices/beliefs?: No Depression: Yes Depression Symptoms:  (Pt denies current symptoms but rates at 6/10) Substance abuse history and/or treatment for substance abuse?: No Suicide prevention information given to non-admitted patients: Not applicable  Risk to Others within the past 6 months Homicidal Ideation: No Thoughts of Harm to Others: No Current Homicidal Intent: No Current Homicidal Plan: No Access to Homicidal Means: No Identified Victim: No one History of harm to others?: No Assessment of Violence: None Noted Violent Behavior Description: None reported Does patient have access to weapons?: No Criminal Charges Pending?: No Does patient have a court date: No  Psychosis Hallucinations: None noted Delusions: None noted  Mental Status Report Appear/Hygiene: Disheveled Eye Contact: Good Motor Activity: Freedom of movement;Unremarkable Speech: Logical/coherent Level of Consciousness: Alert Mood: Depressed Affect: Appropriate to circumstance Anxiety Level: None Thought Processes: Coherent;Relevant Judgement: Unimpaired Orientation: Person;Place;Time;Situation Obsessive Compulsive Thoughts/Behaviors: None  Cognitive Functioning Concentration: Normal Memory: Recent Intact;Remote Intact IQ: Average Insight: Fair Impulse Control: Fair Appetite: Good Weight Loss: 0 Weight Gain: 0 Sleep: No Change Total Hours of Sleep: 8 Vegetative Symptoms: None  ADLScreening [facility] Assessment Services) Patient's cognitive ability adequate to safely complete daily  activities?: Yes Patient able to express need for assistance with ADLs?: Yes Independently performs ADLs?: Yes (appropriate for developmental age)  Prior Inpatient Therapy Prior Inpatient Therapy: Yes Prior Therapy Dates: About a year ago  (Pt says about a year ago.) Prior Therapy Facilty/Provider(s): [facility] Reason for Treatment: SI  Prior Outpatient Therapy Prior Outpatient Therapy: Yes Prior Therapy Dates: One year to current Prior Therapy Facilty/Provider(s): [facility]- Dr. Sheppard Evens Reason for Treatment: OPT and Med Mgt  ADL Screening (condition at time of admission) Patient's cognitive ability adequate to safely complete daily activities?: Yes Is the patient deaf or have difficulty hearing?: No Does the patient have difficulty seeing, even when wearing glasses/contacts?: No (Wears glasses) Does the patient have difficulty concentrating, remembering, or making decisions?: No Patient able to express need for assistance with ADLs?: Yes Does the patient have difficulty dressing or bathing?: No Independently performs ADLs?: Yes (appropriate for developmental age) Does the patient have difficulty walking or climbing stairs?: No Weakness of Legs: None Weakness of Arms/Hands: None       Abuse/Neglect Assessment (Assessment to be complete while patient is alone) Physical Abuse: Denies Verbal Abuse: Denies Sexual Abuse: Denies Exploitation of patient/patient's resources: Denies Self-Neglect: Denies Values / Beliefs Cultural Requests During Hospitalization: None Spiritual Requests During Hospitalization: None   Advance Directives (For Healthcare) Does patient have an advance directive?: No Would  patient like information on creating an advanced directive?: No - patient declined information    Additional Information 1:1 In Past 12 Months?: No CIRT Risk: No Elopement Risk: No Does patient have medical clearance?: Yes     Disposition:  Disposition Initial Assessment  Completed for this Encounter: Yes Disposition of Patient: Other dispositions Type of inpatient treatment program: Adult Other disposition(s): Referred to outside facility (Pt given outpatient referals for therapists)  On Site Evaluation by:   Reviewed with Physician:    Raymondo Band 08/02/2014 7:10 AM

## 2014-08-02 NOTE — Discharge Instructions (Signed)

## 2014-08-02 NOTE — ED Notes (Signed)
Patient discharged to care of mother.  She left ambulatory.  She denies any SI/HI/AVH.

## 2014-08-02 NOTE — ED Notes (Signed)
[name], TTS  counselor at patient bedside for consult.

## 2014-08-02 NOTE — ED Provider Notes (Signed)
Patient seen by TTS. Discussed with behavioral health PA. Do not feel the patient needs inpatient requirements. Patient can contract for safety. Continues to deny suicidal ideation. Is given outpatient referrals. Return precautions given.  Julianne Rice, MD 08/02/14 0500

## 2014-08-29 ENCOUNTER — Encounter (HOSPITAL_COMMUNITY): Payer: Self-pay | Admitting: Emergency Medicine

## 2014-08-29 ENCOUNTER — Emergency Department (HOSPITAL_COMMUNITY)
Admission: EM | Admit: 2014-08-29 | Discharge: 2014-08-29 | Discharge status: Against Medical Advice | Payer: Medicare HMO | Attending: Emergency Medicine | Admitting: Emergency Medicine

## 2014-08-29 ENCOUNTER — Telehealth: Payer: Self-pay | Admitting: *Deleted

## 2014-08-29 DIAGNOSIS — R1110 Vomiting, unspecified: Secondary | ICD-10-CM | POA: Insufficient documentation

## 2014-08-29 DIAGNOSIS — J45909 Unspecified asthma, uncomplicated: Secondary | ICD-10-CM | POA: Diagnosis not present

## 2014-08-29 NOTE — Telephone Encounter (Signed)
Mother of pt calls asking for appt asap, states pt has had N&V for several weeks now but it has been worse in recent days, pt is now weak, cannot keep anything "down". She is advised to transport pt to urg care or ED at [place] for evaluation. She is agreeable

## 2014-08-29 NOTE — Telephone Encounter (Signed)
Agree with plan 

## 2014-08-29 NOTE — ED Notes (Signed)
Pt presents to department for evaluation of vomiting x1 month. Denies abdominal pain. Denies urinary symptoms. Pt is alert and oriented x4. No active vomiting at present. NAD.

## 2014-08-30 ENCOUNTER — Emergency Department (HOSPITAL_COMMUNITY)
Admission: EM | Admit: 2014-08-30 | Discharge: 2014-08-30 | Discharge status: Against Medical Advice | Payer: Medicare HMO | Attending: Emergency Medicine | Admitting: Emergency Medicine

## 2014-08-30 ENCOUNTER — Encounter (HOSPITAL_COMMUNITY): Payer: Self-pay | Admitting: Emergency Medicine

## 2014-08-30 DIAGNOSIS — Z3202 Encounter for pregnancy test, result negative: Secondary | ICD-10-CM | POA: Insufficient documentation

## 2014-08-30 DIAGNOSIS — F909 Attention-deficit hyperactivity disorder, unspecified type: Secondary | ICD-10-CM | POA: Diagnosis not present

## 2014-08-30 DIAGNOSIS — F411 Generalized anxiety disorder: Secondary | ICD-10-CM | POA: Diagnosis not present

## 2014-08-30 DIAGNOSIS — F319 Bipolar disorder, unspecified: Secondary | ICD-10-CM | POA: Insufficient documentation

## 2014-08-30 DIAGNOSIS — J45909 Unspecified asthma, uncomplicated: Secondary | ICD-10-CM | POA: Insufficient documentation

## 2014-08-30 DIAGNOSIS — Z79899 Other long term (current) drug therapy: Secondary | ICD-10-CM | POA: Insufficient documentation

## 2014-08-30 DIAGNOSIS — Z87898 Personal history of other specified conditions: Secondary | ICD-10-CM

## 2014-08-30 DIAGNOSIS — Z87440 Personal history of urinary (tract) infections: Secondary | ICD-10-CM | POA: Diagnosis not present

## 2014-08-30 DIAGNOSIS — R112 Nausea with vomiting, unspecified: Secondary | ICD-10-CM | POA: Insufficient documentation

## 2014-08-30 LAB — URINE MICROSCOPIC-ADD ON

## 2014-08-30 LAB — LIPASE, BLOOD: Lipase: 17 U/L (ref 11–59)

## 2014-08-30 LAB — URINALYSIS, ROUTINE W REFLEX MICROSCOPIC
Bilirubin Urine: NEGATIVE
Glucose, UA: NEGATIVE mg/dL
Hgb urine dipstick: NEGATIVE
Ketones, ur: NEGATIVE mg/dL
Nitrite: NEGATIVE
Protein, ur: NEGATIVE mg/dL
Specific Gravity, Urine: 1.025 (ref 1.005–1.030)
Urobilinogen, UA: 1 mg/dL (ref 0.0–1.0)
pH: 6.5 (ref 5.0–8.0)

## 2014-08-30 LAB — COMPREHENSIVE METABOLIC PANEL
ALT: 22 U/L (ref 0–35)
AST: 18 U/L (ref 0–37)
Albumin: 3.9 g/dL (ref 3.5–5.2)
Alkaline Phosphatase: 109 U/L (ref 39–117)
Anion gap: 12 (ref 5–15)
BUN: 12 mg/dL (ref 6–23)
CO2: 26 mEq/L (ref 19–32)
Calcium: 9.6 mg/dL (ref 8.4–10.5)
Chloride: 102 mEq/L (ref 96–112)
Creatinine, Ser: 0.8 mg/dL (ref 0.50–1.10)
GFR calc Af Amer: >90 mL/min (ref >90)
GFR calc non Af Amer: >90 mL/min (ref >90)
Glucose, Bld: 86 mg/dL (ref 70–99)
Potassium: 4.7 mEq/L (ref 3.7–5.3)
Sodium: 140 mEq/L (ref 137–147)
Total Bilirubin: <0.2 mg/dL — ABNORMAL LOW (ref 0.3–1.2)
Total Protein: 8.1 g/dL (ref 6.0–8.3)

## 2014-08-30 LAB — PREGNANCY, URINE: Preg Test, Ur: NEGATIVE

## 2014-08-30 MED ORDER — SODIUM CHLORIDE 0.9 % IV BOLUS (SEPSIS): 1000.0000 mL | Freq: Once | INTRAVENOUS | Status: DC

## 2014-08-30 MED ORDER — ONDANSETRON HCL 4 MG/2ML IJ SOLN: 4.0000 mg | Freq: Once | INTRAMUSCULAR | Status: DC

## 2014-08-30 NOTE — ED Notes (Signed)
Pt came to desk stating she wanted to leave.  Pt signed AMA to leave.  Will notify PA.  Pt did not want to finish treatment

## 2014-08-30 NOTE — ED Provider Notes (Signed)
CSN: [id number]     Arrival date & time 08/30/14  1402 History   First MD Initiated Contact with Patient 08/30/14 1504     Chief Complaint  Patient presents with  . Emesis     (Consider location/radiation/quality/duration/timing/severity/associated sxs/prior Treatment) HPI Comments: The patient is a 28 year old female past history of bipolar disorder, gastritis presents emergency room chief complaint of persistent vomiting for 2 weeks. The patient reports multiple episodes of vomiting every day. Last episode of emesis yesterday. She denies abdominal pain. Symptoms worsened with oral intake.  LMP 1-1.5 weeks ago. The patient reports normal bowel movements, last BM yesterday. She denies recent change in medication, travel, fever, chills, urinary symptoms, abnormal vaginal discharge. Denies history of abdominal surgeries.  Reports being evaluated by a Gastroenterologist in the past, unsure provider or practice. PCP: Nichole Chiquito, MD  Patient is a 28 y.o. female presenting with vomiting. The history is provided by the patient and medical records.  Emesis Associated symptoms: no abdominal pain, no chills and no diarrhea     Past Medical History  Diagnosis Date  . Bipolar disorder   . Attention deficit hyperactivity disorder   . Depression   . Anxiety   . Asthma   . UTI (urinary tract infection)     Completed Cipro 08/06/12  . Gastritis    Past Surgical History  Procedure Laterality Date  . Mouth surgery    . Eye muscle surgery Bilateral     07/29/12   Family History  Problem Relation Age of Onset  . Depression Mother   . Depression Father   . Depression Brother   . Colon cancer Mother   . Colon polyps Mother   . Liver disease    . Kidney disease     History  Substance Use Topics  . Smoking status: Never Smoker   . Smokeless tobacco: Never Used  . Alcohol Use: No   OB History   Grav Para Term Preterm Abortions TAB SAB Ect Mult Living   0 0             Review of  Systems  Constitutional: Negative for fever and chills.  Gastrointestinal: Positive for nausea and vomiting. Negative for abdominal pain, diarrhea and blood in stool.  Genitourinary: Negative for dysuria, decreased urine volume, vaginal bleeding and vaginal discharge.  Musculoskeletal: Negative for back pain.      Allergies  Depakote; Methylphenidate derivatives; Neurontin; and Prozac  Home Medications   Prior to Admission medications   Medication Sig Start Date End Date Taking? Authorizing Provider  albuterol (PROVENTIL HFA;VENTOLIN HFA) 108 (90 BASE) MCG/ACT inhaler Inhale 2 puffs into the lungs every 6 (six) hours as needed for wheezing or shortness of breath (shortness of breath).    Yes Historical Provider, MD  asenapine (SAPHRIS) 5 MG SUBL 24 hr tablet Place 5 mg under the tongue at bedtime.   Yes Historical Provider, MD  carbamazepine (TEGRETOL XR) 400 MG 12 hr tablet Take 1 tablet (400 mg total) by mouth 2 (two) times daily. 02/14/14  Yes Elmarie Shiley, NP  citalopram (CELEXA) 40 MG tablet Take 40 mg by mouth every morning.  03/28/14  Yes Historical Provider, MD  etonogestrel (IMPLANON) 68 MG IMPL implant Inject 1 each into the skin once.   Yes Historical Provider, MD  LORazepam (ATIVAN) 0.5 MG tablet Take 0.5 mg by mouth 2 (two) times daily. Takes 1 tablet in the morning and 1 tablet at night   Yes Historical Provider, MD  traZODone (  DESYREL) 300 MG tablet Take 1 tablet (300 mg total) by mouth at bedtime. 02/14/14  Yes Elmarie Shiley, NP   BP 121/77  Pulse 71  Temp(Src) 98.1 F (36.7 C) (Oral)  Resp 18  SpO2 98% Physical Exam  Nursing note and vitals reviewed. Constitutional: She is oriented to person, place, and time. She appears well-developed and well-nourished.  Non-toxic appearance. She does not have a sickly appearance. She does not appear ill. No distress.  HENT:  Head: Atraumatic.  Eyes: EOM are normal. No scleral icterus.  Neck: Neck supple.  Cardiovascular: Normal rate  and regular rhythm.   Pulmonary/Chest: Effort normal and breath sounds normal. No respiratory distress. She has no wheezes. She has no rales.  Abdominal: Soft. There is tenderness in the epigastric area and left upper quadrant. There is no rigidity, no guarding and no CVA tenderness.  Obese abdomen  Musculoskeletal: Normal range of motion.  Neurological: She is alert and oriented to person, place, and time.  Skin: Skin is warm and dry. She is not diaphoretic.  Psychiatric: Her behavior is normal.    ED Course  Procedures (including critical care time) Labs Review Labs Reviewed  CBC WITH DIFFERENTIAL  COMPREHENSIVE METABOLIC PANEL  URINALYSIS, ROUTINE W REFLEX MICROSCOPIC  PREGNANCY, URINE  LIPASE, BLOOD  CBC WITH DIFFERENTIAL    Imaging Review No results found.   EKG Interpretation None      MDM   Final diagnoses:  History of nausea and vomiting   Pt presents with vomiting and nausea for 2 weeks. Mild epigastric and LUQ tenderness pt reports only pain with palpation, denies abdominal pain during history. Non-toxic appearance, no peritoneal signs, afebrile, NAD. Labs, fluids, antinausea medication ordered. EMR shows negative abdominal US 05/18/2014, 09/2013, no stones identified.  3:44 PM RN reported that the patient signed out [place] after labs were drawn. EMR shows the patient has left the ED AMA in the past. Meds given in ED:  Medications  ondansetron (ZOFRAN) injection 4 mg (not administered)  sodium chloride 0.9 % bolus 1,000 mL (not administered)    Discharge Medication List as of 08/30/2014  3:47 PM      Harvie Heck, PA-C 08/30/14 1553

## 2014-08-30 NOTE — ED Notes (Signed)
Pt states n/v for 2 weeks.  Denies abdominal pain and fever.  States she went to [facility] yesterday but wait too long and she left.

## 2014-08-31 ENCOUNTER — Telehealth: Payer: Self-pay | Admitting: *Deleted

## 2014-08-31 NOTE — ED Provider Notes (Signed)
Medical screening examination/treatment/procedure(s) were performed by non-physician practitioner and as supervising physician I was immediately available for consultation/collaboration.    Margarine Grosshans, MD 08/31/14 1558 

## 2014-08-31 NOTE — Telephone Encounter (Signed)
Mother called - has been to ER x 2 recently. Past 1-2 weeks n/v, h/a and weakness. No appt available today. Sch appt 09/01/14 3:15PM Dr Mellody Dance. If any change - suggest to go to ER. Hilda Blades Rosalynn Sergent RN 08/31/14 11:30AM

## 2014-09-01 ENCOUNTER — Encounter: Payer: Self-pay | Admitting: Internal Medicine

## 2014-09-01 ENCOUNTER — Ambulatory Visit (INDEPENDENT_AMBULATORY_CARE_PROVIDER_SITE_OTHER): Payer: Commercial Managed Care - HMO | Admitting: Internal Medicine

## 2014-09-01 VITALS — BP 137/92 | HR 86 | Temp 98.4°F | Ht 68.0 in | Wt 252.1 lb

## 2014-09-01 DIAGNOSIS — R1110 Vomiting, unspecified: Secondary | ICD-10-CM

## 2014-09-01 MED ORDER — GUAIFENESIN-CODEINE 400-10 MG PO TABS: 1.0000 | ORAL_TABLET | Freq: Four times a day (QID) | ORAL | Status: DC | PRN

## 2014-09-01 MED ORDER — ONDANSETRON HCL 4 MG PO TABS: 4.0000 mg | ORAL_TABLET | Freq: Three times a day (TID) | ORAL | Status: DC | PRN

## 2014-09-01 NOTE — Progress Notes (Signed)
Patient ID: [name], female   DOB: [birth year], 28 y.o.   MRN: [id number]   Subjective:   Patient ID: [name] female   DOB: 08/20/1986 28 y.o.   MRN: [id number]  HPI: [name] is a 28 y.o. with PMH listed below, presented today with complaint of Vomiting for 2 weeks, 2-3 times everyday. Vomitus is yellowish in colour, not related to meals. No abdominal pain before episodes. No hematemesis, no melena, denies use of NSAIDS, no reflux feeling or hx of abdominal pain. No fever, change in urine colour, stool colour or pruritus. No breast tenderness, doesn't know if she might be pregnant, has not missed a period. No sick contacts. Pt says over the past 2 days she has been having loose stools- 2 ce a day, normal bowel habits- once a day everyday.   On further question pt says over the past 2 weeks she has been coughing prior to each vomiting episode, denies myalgias, but had nasal congestion and sorethroat previously that has since resolved. Cough is productive of clear sputum. Mother who leaves with her is a chronic smoker- has COPD and is always coughing. Pt has no SOB, chest pain, or leg swelling.  Pt was in the Ed 2 days prior for similar complaints of vomiting, workup-  CMET, UA with reflex microscopy, lipase, Pregnancy test, were all done, but pt left AMA, because she says she was there a while, and her mother had to leave. All work up however were negative, except for UA showing- Positive LE and many bacteria. Pt today denies dysuric symptoms, lower abd pain, frequency or urgency.  Past Medical History  Diagnosis Date  . Bipolar disorder   . Attention deficit hyperactivity disorder   . Depression   . Anxiety   . Asthma   . UTI (urinary tract infection)     Completed Cipro 08/06/12  . Gastritis    Current Outpatient Prescriptions  Medication Sig Dispense Refill  . albuterol (PROVENTIL HFA;VENTOLIN HFA) 108 (90 BASE) MCG/ACT inhaler Inhale 2 puffs into the lungs  every 6 (six) hours as needed for wheezing or shortness of breath (shortness of breath).       [name] asenapine (SAPHRIS) 5 MG SUBL 24 hr tablet Place 5 mg under the tongue at bedtime.      . carbamazepine (TEGRETOL XR) 400 MG 12 hr tablet Take 1 tablet (400 mg total) by mouth 2 (two) times daily.  60 tablet  0  . citalopram (CELEXA) 40 MG tablet Take 40 mg by mouth every morning.       . etonogestrel (IMPLANON) 68 MG IMPL implant Inject 1 each into the skin once.      . Guaifenesin-Codeine 400-10 MG TABS Take 1 tablet by mouth every 6 (six) hours as needed.  30 each  0  . LORazepam (ATIVAN) 0.5 MG tablet Take 0.5 mg by mouth 2 (two) times daily. Takes 1 tablet in the morning and 1 tablet at night      . ondansetron (ZOFRAN) 4 MG tablet Take 1 tablet (4 mg total) by mouth every 8 (eight) hours as needed for nausea or vomiting.  15 tablet  0  . traZODone (DESYREL) 300 MG tablet Take 1 tablet (300 mg total) by mouth at bedtime.  30 tablet  0   No current facility-administered medications for this visit.   Family History  Problem Relation Age of Onset  . Depression Mother   . Depression Father   . Depression Brother   .  Colon cancer Mother   . Colon polyps Mother   . Liver disease    . Kidney disease     History   Social History  . Marital Status: Single    Spouse Name: N/A    Number of Children: 0  . Years of Education: N/A   Social History Main Topics  . Smoking status: Never Smoker   . Smokeless tobacco: Never Used  . Alcohol Use: No  . Drug Use: No  . Sexual Activity: None   Other Topics Concern  . None   Social History Narrative  . None   Review of Systems: CONSTITUTIONAL- No Fever, weightloss, night sweat or change in appetite. SKIN- No Rash, colour changes or itching. HEAD- No Headache or dizziness. EYES- No Vision loss, pain, redness, double or blurred vision. EARS- No vertigo, hearing loss or ear discharge. CARDIAC- No Palpitations, DOE, PND or chest pain. URINARY-  No Frequency, urgency, straining or dysuria. NEUROLOGIC- No Numbness, syncope, seizures or burning. [facility]- Denies depression or anxiety.  Objective:  Physical Exam: Filed Vitals:   09/01/14 1523  BP: 137/92  Pulse: 86  Temp: 98.4 F (36.9 C)  TempSrc: Oral  Height: 5\' 8"  (1.727 m)  Weight: 252 lb 1.6 oz (114.352 kg)  SpO2: 99%   GENERAL- alert, co-operative, appears as stated age, not in any distress. HEENT- Atraumatic, normocephalic, PERRL, EOMI, oral mucosa appears moist, no pharyngeal erythema, no cervical LN enlargement, thyroid does not appear enlarged, neck supple. CARDIAC- RRR, no murmurs, rubs or gallops. RESP- Moving equal volumes of air, and clear to auscultation bilaterally, no wheezes or crackles. ABDOMEN- Soft, obese, nontender, no palpable masses or organomegaly, bowel sounds present. BACK- Normal curvature of the spine, No tenderness along the vertebrae, no CVA tenderness. NEURO- No obvious Cr N abnormality, strenght upper and lower extremities- intact, Gait- Normal. EXTREMITIES- pulse 2+, symmetric, no pedal edema. SKIN- Warm, dry, No rash or lesion. PSYCH- Normal mood and affect, appropriate thought content and speech.  Assessment & Plan:  The patient's case and plan of care was discussed with attending physician, Dr. Eppie Gibson.  Please see problem based charting for assessment and plan.

## 2014-09-01 NOTE — Patient Instructions (Addendum)
General Instructions:  I will be prescribing a medication called Zofran for your Vomiting- take one tablet as needed only three times a day.  For your cough take 1 tablet 4 times a day as needed.  If you are still having problems in 2 weeks come back and see [place].    Please bring your medicines with you each time you come to clinic.  Medicines may include prescription medications, over-the-counter medications, herbal remedies, eye drops, vitamins, or other pills.

## 2014-09-02 DIAGNOSIS — R1110 Vomiting, unspecified: Secondary | ICD-10-CM | POA: Insufficient documentation

## 2014-09-02 NOTE — Assessment & Plan Note (Addendum)
Vomiting episodes all preceded by coughing. Pt has no associated symptoms to suggest need for further workup. Pt obese, likely increasing risk for post tussive vomiting, due to increased intrabdominal pressure. Pt has not tried anything for vomiting or coughing. Work up in ED- 08/30/2014- Cmet, preg test all negative. UA- positive for LE and Bacteria, but pt denies dysuria, frequency or abdominal pain.   Assessment- Upper respiratory tract infection- Cough, prior nasal congestion and prior sore-throat, mostly resolved except cough, with resultant vomiting.  Plan- Symptomatic management for now, except if symptoms worsen or unresolved in 2-3 weeks, pt encouraged to make appointment and come to clinic. - Zofran- 4mg  TID PRN # 15 pills - Codeine- guaifenacin- 10-400mg  Q6H PRN, #30 pills. - Asymptomatic bacteuria- will not treat.

## 2014-09-04 NOTE — Progress Notes (Signed)
Case discussed with Dr. Emokpae soon after the resident saw the patient. We reviewed the resident's history and exam and pertinent patient test results. I agree with the assessment, diagnosis, and plan of care documented in the resident's note. 

## 2014-09-05 ENCOUNTER — Telehealth: Payer: Self-pay | Admitting: *Deleted

## 2014-09-05 ENCOUNTER — Other Ambulatory Visit: Payer: Self-pay | Admitting: Internal Medicine

## 2014-09-05 MED ORDER — GUAIFENESIN-CODEINE 100-10 MG/5ML PO SOLN: 10.0000 mL | Freq: Four times a day (QID) | ORAL | Status: DC | PRN

## 2014-09-05 NOTE — Progress Notes (Signed)
Guaifenesin w/Codeine 100- 10 mg per 5cc rx called to [facility].

## 2014-09-05 NOTE — Telephone Encounter (Signed)
Call from [facility] pharmacist [place] wants to change Guaifenesin w/codeine 400-10 mg to Guaifenesin w/codeine 100-10 mg per 5cc which he was able to located for the pt. Thanks

## 2014-09-05 NOTE — Telephone Encounter (Signed)
See Order Only tab.

## 2014-11-06 ENCOUNTER — Telehealth: Payer: Self-pay | Admitting: *Deleted

## 2014-11-06 ENCOUNTER — Encounter: Payer: Self-pay | Admitting: Internal Medicine

## 2014-11-06 ENCOUNTER — Ambulatory Visit (INDEPENDENT_AMBULATORY_CARE_PROVIDER_SITE_OTHER): Payer: Commercial Managed Care - HMO | Admitting: Internal Medicine

## 2014-11-06 VITALS — BP 144/102 | HR 75 | Temp 99.2°F | Wt 263.4 lb

## 2014-11-06 DIAGNOSIS — J069 Acute upper respiratory infection, unspecified: Secondary | ICD-10-CM

## 2014-11-06 DIAGNOSIS — R112 Nausea with vomiting, unspecified: Secondary | ICD-10-CM | POA: Insufficient documentation

## 2014-11-06 LAB — CBC WITH DIFFERENTIAL/PLATELET
Basophils Absolute: 0 10*3/uL (ref 0.0–0.1)
Basophils Relative: 0 % (ref 0–1)
Eosinophils Absolute: 0 10*3/uL (ref 0.0–0.7)
Eosinophils Relative: 0 % (ref 0–5)
HCT: 41.1 % (ref 36.0–46.0)
Hemoglobin: 13.7 g/dL (ref 12.0–15.0)
Lymphocytes Relative: 29 % (ref 12–46)
Lymphs Abs: 1.9 10*3/uL (ref 0.7–4.0)
MCH: 31.7 pg (ref 26.0–34.0)
MCHC: 33.3 g/dL (ref 30.0–36.0)
MCV: 95.1 fL (ref 78.0–100.0)
MPV: 10.6 fL (ref 9.4–12.4)
Monocytes Absolute: 0.3 10*3/uL (ref 0.1–1.0)
Monocytes Relative: 5 % (ref 3–12)
Neutro Abs: 4.4 10*3/uL (ref 1.7–7.7)
Neutrophils Relative %: 66 % (ref 43–77)
Platelets: 210 10*3/uL (ref 150–400)
RBC: 4.32 MIL/uL (ref 3.87–5.11)
RDW: 12.2 % (ref 11.5–15.5)
WBC: 6.6 10*3/uL (ref 4.0–10.5)

## 2014-11-06 LAB — POCT URINALYSIS DIPSTICK
Blood, UA: NEGATIVE
Glucose, UA: NEGATIVE
Ketones, UA: NEGATIVE
Leukocytes, UA: NEGATIVE
Nitrite, UA: NEGATIVE
Protein, UA: NEGATIVE
Spec Grav, UA: >=1.03
Urobilinogen, UA: 0.2
pH, UA: 5

## 2014-11-06 LAB — COMPLETE METABOLIC PANEL WITH GFR
ALT: 21 U/L (ref 0–35)
AST: 16 U/L (ref 0–37)
Albumin: 3.6 g/dL (ref 3.5–5.2)
Alkaline Phosphatase: 109 U/L (ref 39–117)
BUN: 12 mg/dL (ref 6–23)
CO2: 26 mEq/L (ref 19–32)
Calcium: 9.2 mg/dL (ref 8.4–10.5)
Chloride: 101 mEq/L (ref 96–112)
Creat: 0.78 mg/dL (ref 0.50–1.10)
GFR, Est African American: >89 mL/min
GFR, Est Non African American: >89 mL/min
Glucose, Bld: 84 mg/dL (ref 70–99)
Potassium: 4 mEq/L (ref 3.5–5.3)
Sodium: 139 mEq/L (ref 135–145)
Total Bilirubin: <0.2 mg/dL — ABNORMAL LOW (ref 0.3–1.2)
Total Protein: 7.5 g/dL (ref 6.0–8.3)

## 2014-11-06 LAB — POCT URINE PREGNANCY: Preg Test, Ur: NEGATIVE

## 2014-11-06 MED ORDER — GUAIFENESIN-CODEINE 100-10 MG/5ML PO SOLN: 10.0000 mL | Freq: Four times a day (QID) | ORAL | Status: DC | PRN

## 2014-11-06 MED ORDER — ONDANSETRON HCL 4 MG PO TABS
4.0000 mg | ORAL_TABLET | Freq: Three times a day (TID) | ORAL | Status: DC | PRN
Start: 2014-11-06 — End: 2015-03-15

## 2014-11-06 NOTE — Progress Notes (Signed)
Medicine attending: Medical history, presenting problems, physical findings, and medications, reviewed with resident physician Dr. Hayes Ludwig and I concur with her evaluation and management plan.

## 2014-11-06 NOTE — Assessment & Plan Note (Addendum)
Likely post-tussive as it is always preceded by her cough. Denies fever/chills with occasional loose stools making gastroenteritis less likely.  Reports decreased intake per mouth but does not appear hypovolemic with mild hypertension.  -Check CMP, CBC STAT-->all returned within normal limits -Check urine Pregnancy test and urinalysis STAT--> negative for pregnancy, negative for UTI -Rx Zofran 4mg  PRN for nausea -Referred to OB/GYN for Implanon exchange --per pt's mother this is due to be exchanged soon.

## 2014-11-06 NOTE — Patient Instructions (Signed)
General Instructions: -Start taking Claritin 10mg  daily to reduce the post-nasal drip.  -Start taking the cough syrup as instructed to help reduce the cough.  -Take Zofran as needed for the nausea.  -Continue drinking water and plenty of fluids to prevent dehydration.  -Follow up as needed if your symptoms worsen. If you see blood in the vomiting or in the stool, please go to the E.      Please bring your medicines with you each time you come to clinic.  Medicines may include prescription medications, over-the-counter medications, herbal remedies, eye drops, vitamins, or other pills.   Progress Toward Treatment Goals:  No flowsheet data found.  Self Care Goals & Plans:  Self Care Goal 02/17/2013  Manage my medications take my medicines as prescribed  Eat healthy foods eat foods that are low in salt; eat baked foods instead of fried foods; eat smaller portions; eat more vegetables  Be physically active find an activity I enjoy    No flowsheet data found.   Care Management & Community Referrals:  Referral 02/03/2013  Referrals made for care management support none needed

## 2014-11-06 NOTE — Telephone Encounter (Signed)
Pt called with c/o vomiting for 2 weeks, on and off.  Diarrhea for the same amount of time.  Also stuffy nose and productive cough. Not sure about fever.  Will see today in clinic.

## 2014-11-06 NOTE — Progress Notes (Signed)
   Subjective:    Patient ID: [name], female    DOB: Jan 09, 1986, 28 y.o.   MRN: [id number]  HPI [name] is a 28 year old woman with PMH significant for GERD, obesity, bipolar disorder, presenting for evaluation of URI symptoms and nausea/vomiting for 2 weeks.  She states that her cough started first with post-nasal drip that caused her to have nausea with vomiting. She has had intermittent loose stools as well. Denies any sick contacts. No melena, hematochezia, or bloody emesis. Has had decreased intake per mouth with occasional dizziness. Also reports dysuria but denies abdominal pain.    Review of Systems  Constitutional: Positive for appetite change. Negative for fever, diaphoresis, activity change, fatigue and unexpected weight change.  HENT: Positive for congestion, postnasal drip and rhinorrhea. Negative for ear pain, nosebleeds, sinus pressure and sore throat.   Respiratory: Positive for cough. Negative for shortness of breath and wheezing.   Cardiovascular: Negative for chest pain, palpitations and leg swelling.  Gastrointestinal: Positive for nausea, vomiting and diarrhea. Negative for abdominal pain, constipation and blood in stool.  Genitourinary: Positive for dysuria. Negative for flank pain.  Musculoskeletal: Negative for back pain.  Skin: Negative for rash.  Neurological: Positive for light-headedness. Negative for dizziness and weakness.  Psychiatric/Behavioral: Negative for agitation.       Objective:   Physical Exam  Constitutional: She is oriented to person, place, and time. She appears well-developed and well-nourished. No distress.  HENT:  Mouth/Throat: Oropharynx is clear and moist. No oropharyngeal exudate.  Pale nasal turbinates bilaterally, no sinus tenderness   Cardiovascular: Normal rate and regular rhythm.   Pulmonary/Chest: Effort normal and breath sounds normal. No respiratory distress. She has no wheezes. She has no rales.  Abdominal:  Soft. Bowel sounds are normal. She exhibits no distension. There is no tenderness. There is no rebound and no guarding.  Genitourinary:  No flank pain, no suprapubic tenderness   Musculoskeletal: She exhibits no edema or tenderness.  Neurological: She is alert and oriented to person, place, and time. Coordination normal.  Skin: Skin is warm and dry. No rash noted. She is not diaphoretic.  Psychiatric: She has a normal mood and affect.  Nursing note and vitals reviewed.         Assessment & Plan:

## 2014-11-06 NOTE — Assessment & Plan Note (Signed)
Has had a cough with post-nasal drip and clear to yellow rhinorrhea for 2 weeks. Viral for allergic etiology likely.  -Rx OTC Claritin 10mg  daily (unable to determine if Flonase covered by her ins plan) -Rx guaifenesin-robitussin cough suppressant --pt's mom tells me they are not able to afford OTC cough suppressants, Tessalon Perles not covered by her insurance

## 2014-12-02 ENCOUNTER — Encounter (HOSPITAL_COMMUNITY): Payer: Self-pay | Admitting: Emergency Medicine

## 2014-12-02 ENCOUNTER — Emergency Department (INDEPENDENT_AMBULATORY_CARE_PROVIDER_SITE_OTHER)
Admission: EM | Admit: 2014-12-02 | Discharge: 2014-12-02 | Disposition: A | Discharge status: Home / Self Care | Payer: Commercial Managed Care - HMO | Source: Home / Self Care | Attending: Emergency Medicine | Admitting: Emergency Medicine

## 2014-12-02 DIAGNOSIS — S46811A Strain of other muscles, fascia and tendons at shoulder and upper arm level, right arm, initial encounter: Secondary | ICD-10-CM

## 2014-12-02 MED ORDER — NAPROXEN 500 MG PO TABS: 500.0000 mg | ORAL_TABLET | Freq: Two times a day (BID) | ORAL | Status: DC

## 2014-12-02 MED ORDER — KETOROLAC TROMETHAMINE 60 MG/2ML IM SOLN
INTRAMUSCULAR | Status: AC
  Filled 2014-12-02: qty 2

## 2014-12-02 MED ORDER — KETOROLAC TROMETHAMINE 60 MG/2ML IM SOLN
60.0000 mg | Freq: Once | INTRAMUSCULAR | Status: AC
  Administered 2014-12-02: 60 mg via INTRAMUSCULAR

## 2014-12-02 NOTE — ED Notes (Signed)
C/o upper back/neck pain onset 2-3 weeks Increases w/activity Denies inj/trauma Steady gait; NAD Alert, no signs of acute distress.

## 2014-12-02 NOTE — Discharge Instructions (Signed)

## 2014-12-02 NOTE — ED Provider Notes (Addendum)
CSN: [id number]     Arrival date & time 12/02/14  1247 History   First MD Initiated Contact with Patient 12/02/14 1337     Chief Complaint  Patient presents with  . Back Pain   (Consider location/radiation/quality/duration/timing/severity/associated sxs/prior Treatment) HPI Comments: Patient presents with 2-3 week history of right sided neck and posterior shoulder discomfort. Has taken the occasional dose of ibuprofen for discomfort with some relief. Has not followed up with her PCP at [facility] for this issue. Denies any known injury or previous episodes. No loss or strength or sensation of upper extremities. Only has discomfort with use of upper extremities or ROM of head/neck.  Is right hand dominant Unemployed    The history is provided by the patient.    Past Medical History  Diagnosis Date  . Bipolar disorder   . Attention deficit hyperactivity disorder   . Depression   . Anxiety   . Asthma   . UTI (urinary tract infection)     Completed Cipro 08/06/12  . Gastritis    Past Surgical History  Procedure Laterality Date  . Mouth surgery    . Eye muscle surgery Bilateral     07/29/12   Family History  Problem Relation Age of Onset  . Depression Mother   . Depression Father   . Depression Brother   . Colon cancer Mother   . Colon polyps Mother   . Liver disease    . Kidney disease     History  Substance Use Topics  . Smoking status: Never Smoker   . Smokeless tobacco: Never Used  . Alcohol Use: No   OB History    Gravida Para Term Preterm AB TAB SAB Ectopic Multiple Living   0 0             Review of Systems  All other systems reviewed and are negative.   Allergies  Depakote; Methylphenidate derivatives; Neurontin; and Prozac  Home Medications   Prior to Admission medications   Medication Sig Start Date End Date Taking? Authorizing Provider  asenapine (SAPHRIS) 5 MG SUBL 24 hr tablet Place 5 mg under the tongue at bedtime.   Yes Historical Provider, MD    carbamazepine (TEGRETOL XR) 400 MG 12 hr tablet Take 1 tablet (400 mg total) by mouth 2 (two) times daily. 02/14/14  Yes Elmarie Shiley, NP  citalopram (CELEXA) 40 MG tablet Take 40 mg by mouth every morning.  03/28/14  Yes Historical Provider, MD  LORazepam (ATIVAN) 0.5 MG tablet Take 0.5 mg by mouth 2 (two) times daily. Takes 1 tablet in the morning and 1 tablet at night   Yes Historical Provider, MD  traZODone (DESYREL) 300 MG tablet Take 1 tablet (300 mg total) by mouth at bedtime. 02/14/14  Yes Elmarie Shiley, NP  albuterol (PROVENTIL HFA;VENTOLIN HFA) 108 (90 BASE) MCG/ACT inhaler Inhale 2 puffs into the lungs every 6 (six) hours as needed for wheezing or shortness of breath (shortness of breath).     Historical Provider, MD  etonogestrel (IMPLANON) 68 MG IMPL implant Inject 1 each into the skin once.    Historical Provider, MD  guaiFENesin-codeine 100-10 MG/5ML syrup Take 10 mLs by mouth every 6 (six) hours as needed for cough. 11/06/14   Blain Pais, MD  naproxen (NAPROSYN) 500 MG tablet Take 1 tablet (500 mg total) by mouth 2 (two) times daily with a meal. X 7 days 12/02/14   Audelia Hives Palmer Shorey, PA  ondansetron (ZOFRAN) 4 MG tablet Take  1 tablet (4 mg total) by mouth every 8 (eight) hours as needed for nausea or vomiting. 11/06/14   Blain Pais, MD   BP 123/84 mmHg  Pulse 74  Temp(Src) 97.8 F (36.6 C) (Oral)  Resp 16  SpO2 96% Physical Exam  Constitutional: She is oriented to person, place, and time. She appears well-developed and well-nourished. No distress.  HENT:  Head: Normocephalic and atraumatic.  Right Ear: Hearing and external ear normal.  Left Ear: Hearing and external ear normal.  Eyes: Conjunctivae are normal.  Neck: Normal range of motion and phonation normal. Muscular tenderness present. No tracheal tenderness present. No tracheal deviation present. No thyroid mass and no thyromegaly present.    Outlined area is area of tenderness along body of right  trapezius muscle. No rash. No spasm. No skin changes, ecchymosis or STS  Cardiovascular: Normal rate, regular rhythm and normal heart sounds.   Pulmonary/Chest: Effort normal and breath sounds normal. No stridor.  Musculoskeletal: Normal range of motion.  Neurological: She is alert and oriented to person, place, and time.  CSM exam of bilateral upper extremities normal/intact.   Skin: Skin is warm and dry.  Psychiatric: She has a normal mood and affect. Her behavior is normal.  Nursing note and vitals reviewed.   ED Course  Procedures (including critical care time) Labs Review Labs Reviewed - No data to display  Imaging Review No results found.   MDM   1. Trapezius muscle strain, right, initial encounter   Warm compresses and NSAIDs as prescribed with PCP follow up if no improvement. Exam without clinical evidence of neurovascular or neuromuscular compromise.    Lutricia Feil, [place] 12/02/14 1440  12/02/2014: Addendum: Patient received 60mg  IM toradol while at [facility] for pain management.   Lutricia Feil, [place] 12/02/14 [phone]

## 2014-12-19 ENCOUNTER — Telehealth: Payer: Self-pay | Admitting: *Deleted

## 2014-12-19 NOTE — Telephone Encounter (Signed)
Pt's mother calls and states daughter had a female friend stay over Sunday night, he left early mon am, was nervous acting, pt has been acting like a "zombie since", she ask for an appt for evaluation of this, she is ask to take pt to ED for evaluation and assistance. She is agreeable

## 2014-12-19 NOTE — Telephone Encounter (Signed)
Agree with plan.  I have not yet had the opportunity to meet this patient, though I am listed as PCP.  Agree with ED evaluation.

## 2014-12-28 ENCOUNTER — Ambulatory Visit (INDEPENDENT_AMBULATORY_CARE_PROVIDER_SITE_OTHER): Payer: Medicare Other | Admitting: Internal Medicine

## 2014-12-28 ENCOUNTER — Encounter: Payer: Self-pay | Admitting: Internal Medicine

## 2014-12-28 VITALS — BP 124/99 | HR 84 | Temp 98.4°F | Wt 258.7 lb

## 2014-12-28 DIAGNOSIS — R34 Anuria and oliguria: Secondary | ICD-10-CM

## 2014-12-28 DIAGNOSIS — F3132 Bipolar disorder, current episode depressed, moderate: Secondary | ICD-10-CM

## 2014-12-28 NOTE — Assessment & Plan Note (Addendum)
Patient's impulsiveness during this visit (from asking to be admitted to refusing to visit the ED, all within 15 minutes) are consistent with borderline behavior. She is followed by [facility]. She denies ingestion of any medications or illicit drugs other than her prescribed medications. She denies SI or HI.  - Sent to ED, patient refused

## 2014-12-28 NOTE — Progress Notes (Signed)
Subjective:    Patient ID: [name], female    DOB: 10-Nov-1986, 29 y.o.   MRN: [id number]  HPI [name] is a 29 yo woman with a history of bipolar disorder, anxiety, prior suicide attempt (03/2014, ibuprofen overdose), obesity and GERD who presented to clinic for evaluation since "I haven't peed in 6 weeks". She states that she has been eating and drinking normally over that time period. She has also noticed sharp pain throughout her lower abdomen over this same period of time. The pain is so severe (a 10/10 in intensity) that she has not been able to sleep. She denies subjective fever, chills, nausea, vomiting or vaginal discharge. She has not been sexually active for 2 years and denies any alcohol, illicit drug or new medication ingestion. She has never had trouble urinating in the past.  Of note, her mother called [facility] last week after she found her daughter to be nervous-acting and "zombie"-like. It was advised that the patient visit the ED, but it does not appear that she ever did. The patient does not recall this event. Two weeks prior to that, on 12/02/14, the patient did visit the ED with neck and shoulder discomfort. The patient had no mention of dysuria, decreased urinary frequency or abdominal pain at that time. On her prior [facility] clinic visit, her urinalysis, CMP, CBC were all negative. On other clinic and ED visits, patient has given vague symptoms and left prior to completion of assessment (left clinic in 04/2014 when told she didn't need to be admitted; left ED visit prior to being seen 05/2014 and prior to providing urine sample on another occasion).  Patient reports compliance with her asenapine, celexa, ativan and trazodone as prescribed.  Review of Systems  Constitutional: Negative for fever, chills, activity change and appetite change.       Drinking normal amount for her, which is "a lot of water"  Eyes: Negative.   Respiratory: Negative for shortness of breath.     Cardiovascular: Negative.  Negative for chest pain, palpitations and leg swelling.  Gastrointestinal: Negative for nausea, vomiting, diarrhea, constipation and blood in stool.       Diffuse, sharp abdominal pain  Endocrine: Negative for cold intolerance, heat intolerance and polyuria.  Genitourinary: Positive for decreased urine volume and difficulty urinating. Negative for dysuria, frequency, hematuria, flank pain, vaginal bleeding, vaginal discharge, enuresis and genital sores.  Musculoskeletal: Negative.   Skin: Negative for pallor, rash and wound.  Neurological: Negative.   Psychiatric/Behavioral:       History of bipolar disorder and anxiety. Borderline behavior has also been mentioned in her chart.       Objective:   Physical Exam  Constitutional: She is oriented to person, place, and time. She appears well-developed and well-nourished. No distress.  HENT:  Head: Normocephalic and atraumatic.  Eyes: Conjunctivae and EOM are normal. Pupils are equal, round, and reactive to light.  Neck: Normal range of motion.  Cardiovascular: Normal rate, regular rhythm and normal heart sounds.   No murmur heard. Pulmonary/Chest: Effort normal and breath sounds normal. She has no wheezes.  No increased work of breathing  Abdominal: Soft. Bowel sounds are normal. She exhibits no distension.  Obese abdomen. Diffusely tender to palpation. No guarding. No rebound. No masses.   Genitourinary:  No CVA tenderness b/l.  Musculoskeletal: She exhibits no edema or tenderness.  Neurological: She is alert and oriented to person, place, and time.  Skin: Skin is warm and dry. No rash  noted. She is not diaphoretic. No erythema. No pallor.  Psychiatric:  Patient initially asking to be admitted. When I later told her the recommendation that she visit the ED for imaging of her abdomen, she stated that she just wanted to go home.          Assessment & Plan:  Please see problem oriented charting for  assessment & plan  Venita Lick, MD

## 2014-12-28 NOTE — Assessment & Plan Note (Addendum)
Patient presented because "I haven't peed in 6 weeks" despite high water and food consumption. She has accompanying diffuse abdominal pain. Initially patient requested admission to the hospital. Then, when it was suggested that we take her to the ED for imaging, she stated that she preferred to go home. She was advised multiple times that this decision was against medical advice. She agreed to come back to the ED if her abdominal pain worsened or if she continued to fail to produce urine. None of her complaints were mentioned or physical exam findings noted on clinic or ED visits over past 6 weeks. Patient is afebrile with no CVA tenderness and a soft abdomen; she does not appear to be in distress on exam.  - BMET, patient refused - ED for imaging, patient refused

## 2014-12-28 NOTE — Assessment & Plan Note (Addendum)
The patient currently denies depressed mood. She is not teary, slow-moving or agitated on exam. She does state that she has not been sleeping very well; she appears tired on exam. She is followed by [facility].   - Continue asenapine, celexa, ativan and trazodone as prescribed (patient reports compliance) - Sent to ED, patient refused

## 2014-12-28 NOTE — Patient Instructions (Signed)
Suggest visit to the Emergency Department for imaging and lab work.  General Instructions:   Please bring your medicines with you each time you come to clinic.  Medicines may include prescription medications, over-the-counter medications, herbal remedies, eye drops, vitamins, or other pills.   Progress Toward Treatment Goals:  No flowsheet data found.  Self Care Goals & Plans:  Self Care Goal 02/17/2013  Manage my medications take my medicines as prescribed  Eat healthy foods eat foods that are low in salt; eat baked foods instead of fried foods; eat smaller portions; eat more vegetables  Be physically active find an activity I enjoy    No flowsheet data found.   Care Management & Community Referrals:  Referral 02/03/2013  Referrals made for care management support none needed

## 2014-12-29 NOTE — Progress Notes (Signed)
INTERNAL MEDICINE TEACHING ATTENDING ADDENDUM - Oviya Ammar, MD: I reviewed and discussed at the time of visit with the resident Dr. Mallory, the patient's medical history, physical examination, diagnosis and results of pertinent tests and treatment and I agree with the patient's care as documented.  

## 2015-01-01 ENCOUNTER — Encounter: Payer: Self-pay | Admitting: Internal Medicine

## 2015-01-03 ENCOUNTER — Encounter (HOSPITAL_COMMUNITY): Payer: Self-pay

## 2015-01-03 ENCOUNTER — Emergency Department (HOSPITAL_COMMUNITY)
Admission: EM | Admit: 2015-01-03 | Discharge: 2015-01-04 | Disposition: A | Discharge status: Home / Self Care | Payer: Medicare Other | Attending: Emergency Medicine | Admitting: Emergency Medicine

## 2015-01-03 DIAGNOSIS — Z79899 Other long term (current) drug therapy: Secondary | ICD-10-CM | POA: Diagnosis not present

## 2015-01-03 DIAGNOSIS — J45909 Unspecified asthma, uncomplicated: Secondary | ICD-10-CM | POA: Insufficient documentation

## 2015-01-03 DIAGNOSIS — Y9389 Activity, other specified: Secondary | ICD-10-CM | POA: Diagnosis not present

## 2015-01-03 DIAGNOSIS — N739 Female pelvic inflammatory disease, unspecified: Secondary | ICD-10-CM

## 2015-01-03 DIAGNOSIS — Z791 Long term (current) use of non-steroidal anti-inflammatories (NSAID): Secondary | ICD-10-CM | POA: Insufficient documentation

## 2015-01-03 DIAGNOSIS — F419 Anxiety disorder, unspecified: Secondary | ICD-10-CM | POA: Diagnosis not present

## 2015-01-03 DIAGNOSIS — Z87440 Personal history of urinary (tract) infections: Secondary | ICD-10-CM | POA: Insufficient documentation

## 2015-01-03 DIAGNOSIS — T7421XA Adult sexual abuse, confirmed, initial encounter: Secondary | ICD-10-CM | POA: Diagnosis not present

## 2015-01-03 DIAGNOSIS — Y92008 Other place in unspecified non-institutional (private) residence as the place of occurrence of the external cause: Secondary | ICD-10-CM | POA: Insufficient documentation

## 2015-01-03 DIAGNOSIS — Z0441 Encounter for examination and observation following alleged adult rape: Secondary | ICD-10-CM | POA: Diagnosis present

## 2015-01-03 DIAGNOSIS — Z3202 Encounter for pregnancy test, result negative: Secondary | ICD-10-CM | POA: Diagnosis not present

## 2015-01-03 DIAGNOSIS — F319 Bipolar disorder, unspecified: Secondary | ICD-10-CM | POA: Diagnosis not present

## 2015-01-03 DIAGNOSIS — Y998 Other external cause status: Secondary | ICD-10-CM | POA: Diagnosis not present

## 2015-01-03 DIAGNOSIS — Z8719 Personal history of other diseases of the digestive system: Secondary | ICD-10-CM | POA: Diagnosis not present
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
## 2015-01-03 NOTE — ED Notes (Signed)
Pt also complains of frequent urination and urinating a very small amount
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
## 2015-01-04 DIAGNOSIS — T7421XA Adult sexual abuse, confirmed, initial encounter: Secondary | ICD-10-CM | POA: Diagnosis not present

## 2015-01-04 LAB — URINALYSIS, ROUTINE W REFLEX MICROSCOPIC
Glucose, UA: NEGATIVE mg/dL
Ketones, ur: NEGATIVE mg/dL
Nitrite: NEGATIVE
Protein, ur: 30 mg/dL — AB
Specific Gravity, Urine: 1.037 — ABNORMAL HIGH (ref 1.005–1.030)
Urobilinogen, UA: 1 mg/dL (ref 0.0–1.0)
pH: 5.5 (ref 5.0–8.0)

## 2015-01-04 LAB — URINE MICROSCOPIC-ADD ON

## 2015-01-04 LAB — WET PREP, GENITAL
Clue Cells Wet Prep HPF POC: NONE SEEN
Trich, Wet Prep: NONE SEEN
Yeast Wet Prep HPF POC: NONE SEEN

## 2015-01-04 LAB — GC/CHLAMYDIA PROBE AMP (~~LOC~~) NOT AT ARMC
Chlamydia: NEGATIVE
Neisseria Gonorrhea: NEGATIVE

## 2015-01-04 LAB — PREGNANCY, URINE: Preg Test, Ur: NEGATIVE

## 2015-01-04 MED ORDER — DOXYCYCLINE HYCLATE 100 MG PO CAPS: 100.0000 mg | ORAL_CAPSULE | Freq: Two times a day (BID) | ORAL | Status: DC

## 2015-01-04 MED ORDER — CEFTRIAXONE SODIUM 250 MG IJ SOLR
250.0000 mg | Freq: Once | INTRAMUSCULAR | Status: AC
  Administered 2015-01-04: 250 mg via INTRAMUSCULAR
  Filled 2015-01-04: qty 250

## 2015-01-04 NOTE — ED Notes (Signed)
Pt ambulated to restroom w/o difficulty or pain, pelvic set up and pt instructed to undress from waist down, pt requesting female do her pelvic exam.

## 2015-01-04 NOTE — Discharge Instructions (Signed)
Return here as needed.  Follow-up with your primary care doctor °
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
## 2015-01-12 LAB — URINE CULTURE: Colony Count: >=100000

## 2015-01-17 ENCOUNTER — Encounter: Payer: Self-pay | Admitting: Internal Medicine

## 2015-02-02 ENCOUNTER — Ambulatory Visit (INDEPENDENT_AMBULATORY_CARE_PROVIDER_SITE_OTHER): Payer: Medicare Other | Admitting: Internal Medicine

## 2015-02-02 ENCOUNTER — Encounter: Payer: Self-pay | Admitting: Internal Medicine

## 2015-02-02 VITALS — BP 124/87 | HR 79 | Temp 98.5°F | Resp 20 | Ht 65.5 in | Wt 258.2 lb

## 2015-02-02 DIAGNOSIS — H109 Unspecified conjunctivitis: Secondary | ICD-10-CM | POA: Insufficient documentation

## 2015-02-02 DIAGNOSIS — Z7251 High risk heterosexual behavior: Secondary | ICD-10-CM

## 2015-02-02 MED ORDER — ERYTHROMYCIN 5 MG/GM OP OINT: TOPICAL_OINTMENT | Freq: Four times a day (QID) | OPHTHALMIC | Status: AC

## 2015-02-02 NOTE — Assessment & Plan Note (Signed)
Pt has unprotected sex 5 days ago that was consensual. Denies dysuria and vaginal discharge. Offered to do a urine STD test. Pt refused at this time as she states she cannot provide urine sample right now. Instructed pt to call clinic for an appt if she would like STD screening.

## 2015-02-02 NOTE — Progress Notes (Signed)
Subjective:     Patient ID: MEIA EMLEY, female   DOB: 10-12-1986, 29 y.o.   MRN: [id number]  HPI Pt is a 29 y/o female w/ PMHx of OSA, obesity, bipolar d/o, and GERD who presents to clinic for acute visit for b/l red eyes. States it started with her rt eye 3-4 days ago and now both her eye are red. Has thick yellow discharge throughout the day. Associated w/ photophobia, blurry vision and foreign body sensation. Pt does not wear contact lens. Pt does not work and stays at home. Denies recent URI, dysuria, and vaginal discharge. Had unprotected sex 5 days ago that was consensual.    Review of Systems  Constitutional: Negative for fever.  Eyes: Positive for photophobia, discharge, redness, itching and visual disturbance.  Genitourinary: Negative for dysuria and vaginal discharge.       Objective:   Physical Exam  Constitutional: She appears well-developed and well-nourished.  HENT:  Head: Normocephalic.  Eyes injected b/l, no discharge or crusting noted, upper lids b/l swollen and erythematous. Pupils reactive to light b/l, normal EOMI.        Assessment:     Please see problem based charting.      Plan:     Please see problem based charting.

## 2015-02-02 NOTE — Patient Instructions (Signed)
Start using erythromycin ointment 4 times a day for 5-7 days. Return to clinic if your eye symptoms do not get better.     Bacterial Conjunctivitis Bacterial conjunctivitis, commonly called pink eye, is an inflammation of the clear membrane that covers the white part of the eye (conjunctiva). The inflammation can also happen on the underside of the eyelids. The blood vessels in the conjunctiva become inflamed, causing the eye to become red or pink. Bacterial conjunctivitis may spread easily from one eye to another and from person to person (contagious).  CAUSES  Bacterial conjunctivitis is caused by bacteria. The bacteria may come from your own skin, your upper respiratory tract, or from someone else with bacterial conjunctivitis. SYMPTOMS  The normally white color of the eye or the underside of the eyelid is usually pink or red. The pink eye is usually associated with irritation, tearing, and some sensitivity to light. Bacterial conjunctivitis is often associated with a thick, yellowish discharge from the eye. The discharge may turn into a crust on the eyelids overnight, which causes your eyelids to stick together. If a discharge is present, there may also be some blurred vision in the affected eye. DIAGNOSIS  Bacterial conjunctivitis is diagnosed by your caregiver through an eye exam and the symptoms that you report. Your caregiver looks for changes in the surface tissues of your eyes, which may point to the specific type of conjunctivitis. A sample of any discharge may be collected on a cotton-tip swab if you have a severe case of conjunctivitis, if your cornea is affected, or if you keep getting repeat infections that do not respond to treatment. The sample will be sent to a lab to see if the inflammation is caused by a bacterial infection and to see if the infection will respond to antibiotic medicines. TREATMENT   Bacterial conjunctivitis is treated with antibiotics. Antibiotic eyedrops are most  often used. However, antibiotic ointments are also available. Antibiotics pills are sometimes used. Artificial tears or eye washes may ease discomfort. HOME CARE INSTRUCTIONS   To ease discomfort, apply a cool, clean washcloth to your eye for 10-20 minutes, 3-4 times a day.  Gently wipe away any drainage from your eye with a warm, wet washcloth or a cotton ball.  Wash your hands often with soap and water. Use paper towels to dry your hands.  Do not share towels or washcloths. This may spread the infection.  Change or wash your pillowcase every day.  You should not use eye makeup until the infection is gone.  Do not operate machinery or drive if your vision is blurred.  Stop using contact lenses. Ask your caregiver how to sterilize or replace your contacts before using them again. This depends on the type of contact lenses that you use.  When applying medicine to the infected eye, do not touch the edge of your eyelid with the eyedrop bottle or ointment tube. SEEK IMMEDIATE MEDICAL CARE IF:   Your infection has not improved within 3 days after beginning treatment.  You had yellow discharge from your eye and it returns.  You have increased eye pain.  Your eye redness is spreading.  Your vision becomes blurred.  You have a fever or persistent symptoms for more than 2-3 days.  You have a fever and your symptoms suddenly get worse.  You have facial pain, redness, or swelling. MAKE SURE YOU:   Understand these instructions.  Will watch your condition.  Will get help right away if you are not  doing well or get worse. Document Released: 12/01/2005 Document Revised: 04/17/2014 Document Reviewed: 05/03/2012 [facility] Patient Information 2015 [place], [place]. This information is not intended to replace advice given to you by your health care provider. Make sure you discuss any questions you have with your health care provider.

## 2015-02-02 NOTE — Assessment & Plan Note (Signed)
Pt has b/l conjunctivitis likely bacterial due to thick, yellow mucous d/c throughout the day. EOMI.   - rx for erythromycin ointment QID x 5-7 days. Instructed to RTC if sx do not improved. Educated on Comptroller.

## 2015-02-05 NOTE — Progress Notes (Signed)
INTERNAL MEDICINE TEACHING ATTENDING ADDENDUM - Aldine Contes, MD: I reviewed and discussed at the time of visit with the resident Dr. Hulen Luster, the patient's medical history, physical examination, diagnosis and results of pertinent tests and treatment and I agree with the patient's care as documented.

## 2015-03-07 ENCOUNTER — Ambulatory Visit: Payer: Self-pay | Admitting: Internal Medicine

## 2015-03-12 ENCOUNTER — Encounter (HOSPITAL_COMMUNITY): Payer: Self-pay | Admitting: Emergency Medicine

## 2015-03-12 ENCOUNTER — Emergency Department (HOSPITAL_COMMUNITY)
Admission: EM | Admit: 2015-03-12 | Discharge: 2015-03-13 | Disposition: A | Discharge status: Other Acute Inpatient Hospital | Payer: Medicare Other | Attending: Emergency Medicine | Admitting: Emergency Medicine

## 2015-03-12 DIAGNOSIS — F319 Bipolar disorder, unspecified: Secondary | ICD-10-CM | POA: Diagnosis not present

## 2015-03-12 DIAGNOSIS — S60812A Abrasion of left wrist, initial encounter: Secondary | ICD-10-CM | POA: Insufficient documentation

## 2015-03-12 DIAGNOSIS — F1310 Sedative, hypnotic or anxiolytic abuse, uncomplicated: Secondary | ICD-10-CM | POA: Insufficient documentation

## 2015-03-12 DIAGNOSIS — Y9289 Other specified places as the place of occurrence of the external cause: Secondary | ICD-10-CM | POA: Diagnosis not present

## 2015-03-12 DIAGNOSIS — Z8719 Personal history of other diseases of the digestive system: Secondary | ICD-10-CM | POA: Insufficient documentation

## 2015-03-12 DIAGNOSIS — J45909 Unspecified asthma, uncomplicated: Secondary | ICD-10-CM | POA: Diagnosis not present

## 2015-03-12 DIAGNOSIS — Z046 Encounter for general psychiatric examination, requested by authority: Secondary | ICD-10-CM | POA: Diagnosis present

## 2015-03-12 DIAGNOSIS — Y998 Other external cause status: Secondary | ICD-10-CM | POA: Insufficient documentation

## 2015-03-12 DIAGNOSIS — Y9389 Activity, other specified: Secondary | ICD-10-CM | POA: Diagnosis not present

## 2015-03-12 DIAGNOSIS — Z3202 Encounter for pregnancy test, result negative: Secondary | ICD-10-CM | POA: Insufficient documentation

## 2015-03-12 DIAGNOSIS — Z79899 Other long term (current) drug therapy: Secondary | ICD-10-CM | POA: Diagnosis not present

## 2015-03-12 DIAGNOSIS — Z87440 Personal history of urinary (tract) infections: Secondary | ICD-10-CM | POA: Insufficient documentation

## 2015-03-12 DIAGNOSIS — F419 Anxiety disorder, unspecified: Secondary | ICD-10-CM | POA: Diagnosis not present

## 2015-03-12 DIAGNOSIS — X788XXA Intentional self-harm by other sharp object, initial encounter: Secondary | ICD-10-CM | POA: Diagnosis not present

## 2015-03-12 DIAGNOSIS — T1491XA Suicide attempt, initial encounter: Secondary | ICD-10-CM

## 2015-03-12 LAB — CBC
HCT: 40.7 % (ref 36.0–46.0)
Hemoglobin: 13.3 g/dL (ref 12.0–15.0)
MCH: 31.3 pg (ref 26.0–34.0)
MCHC: 32.7 g/dL (ref 30.0–36.0)
MCV: 95.8 fL (ref 78.0–100.0)
Platelets: 182 10*3/uL (ref 150–400)
RBC: 4.25 MIL/uL (ref 3.87–5.11)
RDW: 12.5 % (ref 11.5–15.5)
WBC: 7.6 10*3/uL (ref 4.0–10.5)

## 2015-03-12 LAB — COMPREHENSIVE METABOLIC PANEL
ALT: 22 U/L (ref 0–35)
AST: 17 U/L (ref 0–37)
Albumin: 3.7 g/dL (ref 3.5–5.2)
Alkaline Phosphatase: 78 U/L (ref 39–117)
Anion gap: 8 (ref 5–15)
BUN: 8 mg/dL (ref 6–23)
CO2: 26 mmol/L (ref 19–32)
Calcium: 8.8 mg/dL (ref 8.4–10.5)
Chloride: 106 mmol/L (ref 96–112)
Creatinine, Ser: 0.86 mg/dL (ref 0.50–1.10)
GFR calc Af Amer: >90 mL/min (ref >90)
GFR calc non Af Amer: >90 mL/min (ref >90)
Glucose, Bld: 93 mg/dL (ref 70–99)
Potassium: 4.3 mmol/L (ref 3.5–5.1)
Sodium: 140 mmol/L (ref 135–145)
Total Bilirubin: 0.2 mg/dL — ABNORMAL LOW (ref 0.3–1.2)
Total Protein: 7.3 g/dL (ref 6.0–8.3)

## 2015-03-12 LAB — RAPID URINE DRUG SCREEN, HOSP PERFORMED
Amphetamines: NOT DETECTED
Barbiturates: NOT DETECTED
Benzodiazepines: POSITIVE — AB
Cocaine: NOT DETECTED
Opiates: NOT DETECTED
Tetrahydrocannabinol: NOT DETECTED

## 2015-03-12 LAB — POC URINE PREG, ED: Preg Test, Ur: NEGATIVE

## 2015-03-12 LAB — ACETAMINOPHEN LEVEL: Acetaminophen (Tylenol), Serum: <10 ug/mL — ABNORMAL LOW (ref 10–30)

## 2015-03-12 LAB — ETHANOL: Alcohol, Ethyl (B): <5 mg/dL (ref 0–9)

## 2015-03-12 LAB — SALICYLATE LEVEL: Salicylate Lvl: <4 mg/dL (ref 2.8–20.0)

## 2015-03-12 MED ORDER — ONDANSETRON HCL 4 MG PO TABS: 4.0000 mg | ORAL_TABLET | Freq: Three times a day (TID) | ORAL | Status: DC | PRN

## 2015-03-12 MED ORDER — ALUM & MAG HYDROXIDE-SIMETH 200-200-20 MG/5ML PO SUSP: 30.0000 mL | ORAL | Status: DC | PRN

## 2015-03-12 MED ORDER — ZOLPIDEM TARTRATE 5 MG PO TABS: 5.0000 mg | ORAL_TABLET | Freq: Every evening | ORAL | Status: DC | PRN

## 2015-03-12 MED ORDER — ACETAMINOPHEN 325 MG PO TABS: 650.0000 mg | ORAL_TABLET | ORAL | Status: DC | PRN

## 2015-03-12 MED ORDER — IBUPROFEN 200 MG PO TABS: 600.0000 mg | ORAL_TABLET | Freq: Three times a day (TID) | ORAL | Status: DC | PRN
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
## 2015-03-12 NOTE — ED Provider Notes (Signed)
CSN: [id number]     Arrival date & time 03/12/15  1912 History   First MD Initiated Contact with Patient 03/12/15 1950     Chief Complaint  Patient presents with  . Medical Clearance     (Consider location/radiation/quality/duration/timing/severity/associated sxs/prior Treatment) HPI [name] is a 29 year old female with past medical history of bipolar disorder, depression, previous suicide attempts who presents the ER brought by [facility] for IVC/suicide attempt. Patient states she was involved in a verbal altercation with her significant other, and threatened to kill herself. Patient took a razor and abraded her left wrist several times with it. Patient denies numbness, weakness, loss of sensation or function distally to this injury.  Past Medical History  Diagnosis Date  . Bipolar disorder   . Attention deficit hyperactivity disorder   . Depression   . Anxiety   . Asthma   . UTI (urinary tract infection)     Completed Cipro 08/06/12  . Gastritis    Past Surgical History  Procedure Laterality Date  . Mouth surgery    . Eye muscle surgery Bilateral     07/29/12   Family History  Problem Relation Age of Onset  . Depression Mother   . Depression Father   . Depression Brother   . Colon cancer Mother   . Colon polyps Mother   . Liver disease    . Kidney disease     History  Substance Use Topics  . Smoking status: Never Smoker   . Smokeless tobacco: Never Used  . Alcohol Use: No   OB History    Gravida Para Term Preterm AB TAB SAB Ectopic Multiple Living   0 0             Review of Systems  Constitutional: Negative for fever.  HENT: Negative for trouble swallowing.   Eyes: Negative for visual disturbance.  Respiratory: Negative for shortness of breath.   Cardiovascular: Negative for chest pain.  Gastrointestinal: Negative for nausea, vomiting and abdominal pain.  Genitourinary: Negative for dysuria.  Musculoskeletal: Negative for  neck pain.  Skin: Positive for wound. Negative for rash.  Neurological: Negative for dizziness, weakness and numbness.  Psychiatric/Behavioral: Positive for suicidal ideas.      Allergies  Depakote; Methylphenidate derivatives; Neurontin; and Prozac  Home Medications   Prior to Admission medications   Medication Sig Start Date End Date Taking? Authorizing Provider  albuterol (PROVENTIL HFA;VENTOLIN HFA) 108 (90 BASE) MCG/ACT inhaler Inhale 2 puffs into the lungs every 6 (six) hours as needed for wheezing or shortness of breath.    Yes Historical Provider, MD  asenapine (SAPHRIS) 5 MG SUBL 24 hr tablet Place 5 mg under the tongue at bedtime.   Yes Historical Provider, MD  carbamazepine (TEGRETOL XR) 400 MG 12 hr tablet Take 1 tablet (400 mg total) by mouth 2 (two) times daily. 02/14/14  Yes Niel Hummer, NP  citalopram (CELEXA) 40 MG tablet Take 40 mg by mouth every morning.  03/28/14  Yes Historical Provider, MD  traZODone (DESYREL) 300 MG tablet Take 1 tablet (300 mg total) by mouth at bedtime. 02/14/14  Yes Niel Hummer, NP  doxycycline (VIBRAMYCIN) 100 MG capsule Take 1 capsule (100 mg total) by mouth 2 (two) times daily. Patient not taking: Reported on 03/12/2015 01/04/15   Dalia Heading, PA-C  guaiFENesin-codeine 100-10 MG/5ML syrup Take 10 mLs by mouth every 6 (six) hours as needed for cough. Patient not taking: Reported on 01/04/2015 11/06/14   Milbert Coulter  Linnell Fulling, MD  naproxen (NAPROSYN) 500 MG tablet Take 1 tablet (500 mg total) by mouth 2 (two) times daily with a meal. X 7 days Patient not taking: Reported on 01/04/2015 12/02/14   Audelia Hives Presson, PA  ondansetron (ZOFRAN) 4 MG tablet Take 1 tablet (4 mg total) by mouth every 8 (eight) hours as needed for nausea or vomiting. Patient not taking: Reported on 01/04/2015 11/06/14   Blain Pais, MD   BP 128/88 mmHg  Pulse 101  Temp(Src) 98.6 F (37 C) (Oral)  Resp 18  SpO2 100% Physical Exam  Constitutional: She is  oriented to person, place, and time. She appears well-developed and well-nourished. No distress.  HENT:  Head: Normocephalic and atraumatic.  Mouth/Throat: Oropharynx is clear and moist. No oropharyngeal exudate.  Eyes: Right eye exhibits no discharge. Left eye exhibits no discharge. No scleral icterus.  Neck: Normal range of motion.  Cardiovascular: Normal rate, regular rhythm and normal heart sounds.   No murmur heard. Pulmonary/Chest: Effort normal and breath sounds normal. No respiratory distress.  Abdominal: Soft. There is no tenderness.  Musculoskeletal: Normal range of motion. She exhibits no edema or tenderness.  Neurological: She is alert and oriented to person, place, and time. No cranial nerve deficit. Coordination normal.  Skin: Skin is warm and dry. No rash noted. She is not diaphoretic.  6-7 small, 2-3 cm abrasions noted to patient's volar aspect of her left wrist. Hemorrhaging is controlled without bandaging. Radial pulse 2+. Capillary refill less than 2 seconds distally. Motor strength 5 out of 5 at shoulder, elbow, wrist, grip. Distal sensation intact.  Psychiatric:  Patient's mood is smiled [place] depressed with affect appropriate to mood. Speech is normal and communicative. Behavior is appropriate, patient does not appear to be responding to internal stimuli. Thought content is directed at patient's verbal altercation with her significant other, and he endorses having a plan to harm herself earlier. Judgment and insight are impaired.  Nursing note and vitals reviewed.   ED Course  Procedures (including critical care time) Labs Review Labs Reviewed  ACETAMINOPHEN LEVEL - Abnormal; Notable for the following:    Acetaminophen (Tylenol), Serum <10.0 (*)    All other components within normal limits  COMPREHENSIVE METABOLIC PANEL - Abnormal; Notable for the following:    Total Bilirubin 0.2 (*)    All other components within normal limits  URINE RAPID DRUG SCREEN (HOSP  PERFORMED) - Abnormal; Notable for the following:    Benzodiazepines POSITIVE (*)    All other components within normal limits  CBC  ETHANOL  SALICYLATE LEVEL  POC URINE PREG, ED    Imaging Review No results found.   EKG Interpretation None      MDM   Final diagnoses:  Suicide attempt    Patient here for medical screening exam after cutting her wrists with a razor. Several abrasions noted to patient's superficial wrist, no lacerations or repair required. Patient is up-to-date with tetanus. Screening exam performed, patient medically cleared for behavioral health placement. Patient denying having any other medical complaints. Patient was placed on psych hold and TTS consulted.  Following IVC papers from [place], patient does meet inpatient criteria for behavioral health placement. Patient transferred to behavioral health.  BP 128/88 mmHg  Pulse 101  Temp(Src) 98.6 F (37 C) (Oral)  Resp 18  SpO2 100%  Signed,  Dahlia Bailiff, PA-C 10:43 PM     Dahlia Bailiff, PA-C 03/12/15 [id number]  Ernestina Patches, MD 03/13/15 [phone]

## 2015-03-12 NOTE — ED Notes (Signed)
Pt brought in by sheriff, being IVC'd by sheriff. Pt cut her left wrist superficially, but denies SI at the moment.

## 2015-03-12 NOTE — ED Notes (Signed)
Pt's mother: Jenisha Faison [phone] (cell) [phone] (home)  Pt's mother states pt told her "I know their stupid system and I know what I'm going to tell them, and I'm just gonna sign a safety contract." Pt also stated "if my boyfriend don't take me back, and if they let me go home, after you go to sleep I'm going to kill myself."

## 2015-03-12 NOTE — ED Notes (Signed)
Pt has been wanded by security. 

## 2015-03-12 NOTE — ED Provider Notes (Signed)
Pt accepted by Nicoletta Ba, MD 03/12/15 [id number]

## 2015-03-13 ENCOUNTER — Encounter (HOSPITAL_COMMUNITY): Payer: Self-pay | Admitting: *Deleted

## 2015-03-13 ENCOUNTER — Inpatient Hospital Stay (HOSPITAL_COMMUNITY)
Admission: EM | Admit: 2015-03-13 | Discharge: 2015-03-15 | DRG: 885 | Disposition: A | Discharge status: Home / Self Care | Payer: 59 | Source: Intra-hospital | Attending: Psychiatry | Admitting: Psychiatry

## 2015-03-13 DIAGNOSIS — F332 Major depressive disorder, recurrent severe without psychotic features: Principal | ICD-10-CM | POA: Diagnosis present

## 2015-03-13 DIAGNOSIS — F3130 Bipolar disorder, current episode depressed, mild or moderate severity, unspecified: Secondary | ICD-10-CM | POA: Diagnosis present

## 2015-03-13 DIAGNOSIS — Z79899 Other long term (current) drug therapy: Secondary | ICD-10-CM | POA: Diagnosis not present

## 2015-03-13 DIAGNOSIS — R45851 Suicidal ideations: Secondary | ICD-10-CM | POA: Diagnosis present

## 2015-03-13 DIAGNOSIS — F319 Bipolar disorder, unspecified: Secondary | ICD-10-CM | POA: Diagnosis present

## 2015-03-13 MED ORDER — ALUM & MAG HYDROXIDE-SIMETH 200-200-20 MG/5ML PO SUSP: 30.0000 mL | ORAL | Status: DC | PRN

## 2015-03-13 MED ORDER — CITALOPRAM HYDROBROMIDE 40 MG PO TABS
40.0000 mg | ORAL_TABLET | ORAL | Status: DC
  Administered 2015-03-13 – 2015-03-15 (×3): 40 mg via ORAL
  Filled 2015-03-13 (×2): qty 1
  Filled 2015-03-13: qty 2
  Filled 2015-03-13 (×3): qty 1

## 2015-03-13 MED ORDER — ALBUTEROL SULFATE HFA 108 (90 BASE) MCG/ACT IN AERS
2.0000 | INHALATION_SPRAY | Freq: Four times a day (QID) | RESPIRATORY_TRACT | Status: DC | PRN
Start: 2015-03-13 — End: 2015-03-15

## 2015-03-13 MED ORDER — CARBAMAZEPINE ER 400 MG PO TB12
400.0000 mg | ORAL_TABLET | Freq: Two times a day (BID) | ORAL | Status: DC
  Administered 2015-03-13 – 2015-03-15 (×5): 400 mg via ORAL
  Filled 2015-03-13 (×2): qty 1
  Filled 2015-03-13: qty 2
  Filled 2015-03-13 (×7): qty 1

## 2015-03-13 MED ORDER — TRAZODONE HCL 150 MG PO TABS
300.0000 mg | ORAL_TABLET | Freq: Every day | ORAL | Status: DC
  Administered 2015-03-13: 300 mg via ORAL
  Filled 2015-03-13 (×3): qty 2

## 2015-03-13 MED ORDER — ACETAMINOPHEN 325 MG PO TABS: 650.0000 mg | ORAL_TABLET | Freq: Four times a day (QID) | ORAL | Status: DC | PRN

## 2015-03-13 MED ORDER — MAGNESIUM HYDROXIDE 400 MG/5ML PO SUSP: 30.0000 mL | Freq: Every day | ORAL | Status: DC | PRN

## 2015-03-13 MED ORDER — ASENAPINE MALEATE 5 MG SL SUBL
5.0000 mg | SUBLINGUAL_TABLET | Freq: Every day | SUBLINGUAL | Status: DC
  Administered 2015-03-13 – 2015-03-14 (×2): 5 mg via SUBLINGUAL
  Filled 2015-03-13 (×4): qty 1

## 2015-03-13 MED ORDER — HYDROXYZINE HCL 25 MG PO TABS
25.0000 mg | ORAL_TABLET | Freq: Four times a day (QID) | ORAL | Status: DC | PRN
  Administered 2015-03-13 – 2015-03-14 (×2): 25 mg via ORAL
  Filled 2015-03-13 (×2): qty 1

## 2015-03-13 NOTE — Progress Notes (Signed)
Patient ID: [name], female   DOB: Jul 22, 1986, 29 y.o.   MRN: [id number]  Pt currently presents with a flat affect and depressed behavior. Pt is cooperative, pleasant and also very childlike in her interaction. Pt giggles at inappropriate times. Per self inventory, pt rates depression, hopelessness and anxiety at a 0. Pt's daily goal is "not to call my ex" and they intend to do so by "get my mind on something else." Pt reports good sleep, good concentration and a poor appetite.   Pt provided with medications per providers orders. Pt's labs and vitals were monitored throughout the day. Pt supported emotionally and encouraged to express concerns and questions. Pt educated on medications and procedures. EKG completed and placed in pt chart. Md notified.   Pt's safety ensured with 15 minute and environmental checks. Pt currently denies SI/HI and A/V hallucinations. Pt verbally agrees to seek staff if SI/HI or A/VH occurs and to consult with staff before acting on these thoughts. Pt remains in bed this morning but engages with staff when approached.

## 2015-03-13 NOTE — Progress Notes (Signed)
[facility] [place] Group Notes:  (Nursing/MHT/Case Management/Adjunct)  Date:  03/13/2015  Time:  11:42 PM  Type of Therapy:  Group Therapy  Participation Level:  Minimal  Participation Quality:  Appropriate  Affect:  Appropriate  Cognitive:  Alert and Appropriate  Insight:  Improving  Engagement in Group:  Developing/Improving  Modes of Intervention:  Socialization and Support  Summary of Progress/Problems: Pt. Participated in group discussion of recovery.  Pt. Stated her recovery is taking medications as they are prescribed.  Lanell Persons 03/13/2015, 11:42 PM
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
## 2015-03-13 NOTE — Clinical Social Work Note (Addendum)
CSW spoke with patient's mother Joelyn Lover [phone] who reports that patient has a history of being non-compliant with saphris and mother feels that current medications are not effective in stabilizing patient's moods.   Tilden Fossa, MSW, [place] Worker [facility] [phone]
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
## 2015-03-13 NOTE — BHH Group Notes (Signed)
[place] Group Notes:  (Nursing/MHT/Case Management/Adjunct)  Date:  03/13/2015  Time:  0930  Type of Therapy:  Nurse Education  Participation Level:  Did Not Attend  Participation Quality:    Affect:    Cognitive:    Insight:    Engagement in Group:    Modes of Intervention:    Summary of Progress/Problems:  Marissa Calamity 03/13/2015, 10:13 AM

## 2015-03-13 NOTE — BHH Group Notes (Signed)
[place] LCSW Group Therapy 03/13/2015  1:15 PM   Type of Therapy: Group Therapy  Participation Level: Did Not Attend. Patient invited to participate but declined.   Tilden Fossa, MSW, [place] Worker [facility] [phone]

## 2015-03-13 NOTE — Progress Notes (Signed)
D: Patient resting in bed with eyes closed.  Respirations even and unlabored.  Patient appears to be in no apparent distress. A: Staff to monitor Q 15 mins for safety.   R:Patient remains safe on the unit.  

## 2015-03-13 NOTE — ED Notes (Signed)
[employer] called for transportation to [facility]
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
## 2015-03-13 NOTE — Progress Notes (Signed)
D   Pt is pleasant on approach  She complains of anxiety and sadness  Her mood is labile  She interacts with her peers and staff appropriately     A   Verbal support given   Medications administered and effectiveness monitored   Q 15 min checks R   Pt safe at present

## 2015-03-13 NOTE — Progress Notes (Signed)
Pt presents to [facility] alert and cooperative. Pt presented to ED under  IVC due to suicide attempt. Pt reports taking razor and cutting her left wrist after an argument with her boyfriend. "I had a fight with my ex-boyfriend because I cheated on him". Pt has hx of Bipolar d/o, Depression, previous suicide attempts and Asthma.  Currently denies SI/HI, -A/vhall and verbally contracts for safety.  Pt admitted for evaluation, stabilization and reduction of baseline. Wil monitor closely.

## 2015-03-13 NOTE — [facility] Suicide Risk Assessment (Signed)
[facility] INPATIENT:  Family/Significant Other Suicide Prevention Education  Suicide Prevention Education:  Education Completed; mother Celisa Schoenberg [phone],  (name of family member/significant other) has been identified by the patient as the family member/significant other with whom the patient will be residing, and identified as the person(s) who will aid the patient in the event of a mental health crisis (suicidal ideations/suicide attempt).  With written consent from the patient, the family member/significant other has been provided the following suicide prevention education, prior to the and/or following the discharge of the patient.  The suicide prevention education provided includes the following:  Suicide risk factors  Suicide prevention and interventions  National Suicide Hotline telephone number  [facility] assessment telephone number  [facility] Emergency Assistance [place] and/or Residential Mobile Crisis Unit telephone number  Request made of family/significant other to:  Remove weapons (e.g., guns, rifles, knives), all items previously/currently identified as safety concern.    Remove drugs/medications (over-the-counter, prescriptions, illicit drugs), all items previously/currently identified as a safety concern.  The family member/significant other verbalizes understanding of the suicide prevention education information provided.  The family member/significant other agrees to remove the items of safety concern listed above.  Lashondra Vaquerano, Casimiro Needle 03/13/2015, 3:38 PM

## 2015-03-14 DIAGNOSIS — F3130 Bipolar disorder, current episode depressed, mild or moderate severity, unspecified: Secondary | ICD-10-CM

## 2015-03-14 MED ORDER — TRAZODONE HCL 150 MG PO TABS
150.0000 mg | ORAL_TABLET | Freq: Every day | ORAL | Status: DC
  Administered 2015-03-14: 150 mg via ORAL
  Filled 2015-03-14 (×3): qty 1

## 2015-03-14 NOTE — Clinical Social Work Note (Signed)
Referral made to [place] for medication management.  CSW left voicemail for therapist Reita Chard regarding therapy services, awaiting return call.  Tilden Fossa, MSW, [place] Worker [facility] [phone]

## 2015-03-14 NOTE — Clinical Social Work Note (Signed)
CSW left voicemail for therapist Reita Chard regarding scheduling an upcoming appointment. Awaiting return call.   Tilden Fossa, MSW, [place] Worker [facility] [phone]
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
## 2015-03-14 NOTE — BHH Group Notes (Signed)
[place] LCSW Group Therapy 03/14/2015  1:15 PM Type of Therapy: Group Therapy Participation Level: Active  Participation Quality: Attentive, Sharing and Supportive  Affect: Appropriate  Cognitive: Alert and Oriented  Insight: Developing/Improving and Engaged  Engagement in Therapy: Developing/Improving and Engaged  Modes of Intervention: Clarification, Confrontation, Discussion, Education, Exploration, Limit-setting, Orientation, Problem-solving, Rapport Building, Art therapist, Socialization and Support  Summary of Progress/Problems: The topic for group today was emotional regulation. This group focused on both positive and negative emotion identification and allowed group members to process ways to identify feelings, regulate negative emotions, and find healthy ways to manage internal/external emotions. Group members were asked to reflect on a time when their reaction to an emotion led to a negative outcome and explored how alternative responses using emotion regulation would have benefited them. Group members were also asked to discuss a time when emotion regulation was utilized when a negative emotion was experienced. Patient engaged in discussion of triggers and coping skills for anxiety, depression, and anger. Patient reports that her hospitalization has helped her to not feel alone and that she is excited to return home tomorrow. Patient also shared that she recently reconnected with her ex-boyfriend who she identifies as supportive.  Tilden Fossa, MSW, [place] Worker [facility] [phone]

## 2015-03-14 NOTE — Progress Notes (Signed)
D: Patient denies SI/HI and A/V hallucinations; patient reports that she slept well; patient refused labs, vital signs, and refused to go to breakfast;   A: Monitored q 15 minutes; patient encouraged to attend groups; patient educated about medications; patient given medications per physician orders; patient encouraged to express feelings and/or concerns  R: Patient is assertive and can be demanding; patient was in bed all morning until she got up to wait for the [place] at the nurses station so he could see her ;patient's interaction with staff and peers is intrusive when she is up on the milieu otherwise she is in the bed; patient was able to set goal to talk with staff 1:1 when having feelings of SI; patient is taking medications as prescribed and tolerating medications; patient is not attending any groups

## 2015-03-14 NOTE — BHH Group Notes (Signed)
[facility] LCSW Aftercare Discharge Planning Group Note  03/14/2015  8:45 AM  Participation Quality: Did Not Attend. Patient in bed, invited to participate but declined.   Tilden Fossa, MSW, [place] Worker [facility] [phone]

## 2015-03-14 NOTE — Progress Notes (Signed)
Recreation Therapy Notes  Date: 03.30.2016 Time: 9:30am Location: [address] Group Room   Group Topic: Stress Management  Goal Area(s) Addresses:  Patient will actively participate in stress management techniques presented during session.   Behavioral Response: Did not attend.   Aniza Shor L Oris Calmes, LRT/CTRS  Mahamadou Weltz L 03/14/2015 3:10 PM 

## 2015-03-15 ENCOUNTER — Encounter (HOSPITAL_COMMUNITY): Payer: Self-pay | Admitting: Registered Nurse

## 2015-03-15 MED ORDER — CITALOPRAM HYDROBROMIDE 40 MG PO TABS: 40.0000 mg | ORAL_TABLET | ORAL | Status: DC

## 2015-03-15 MED ORDER — HYDROXYZINE HCL 25 MG PO TABS: 25.0000 mg | ORAL_TABLET | Freq: Four times a day (QID) | ORAL | Status: DC | PRN

## 2015-03-15 MED ORDER — TRAZODONE HCL 150 MG PO TABS: 150.0000 mg | ORAL_TABLET | Freq: Every day | ORAL | Status: DC

## 2015-03-15 NOTE — BHH Group Notes (Signed)
The focus of this group is to educate the patient on the purpose and policies of crisis stabilization and provide a format to answer questions about their admission.  The group details unit policies and expectations of patients while admitted. Patient did not attend this group.

## 2015-03-15 NOTE — Progress Notes (Signed)
  [facility] Adult Case Management Discharge Plan :  Will you be returning to the same living situation after discharge:  Yes,  patient plans to return home with her mother At discharge, do you have transportation home?: Yes,  patient reports access to transportation by family or bus pass Do you have the ability to pay for your medications: Yes,  patient will be provided with prescriptions at discharge  Release of information consent forms completed and in the chart;  Patient's signature needed at discharge.  Patient to Follow up at: Follow-up Information    Follow up with [facility].   Why:  Staff member should call you within 5 business days to schedule medication management appointment. If you do not hear back from office by Wed. April 6th, please call office to schedule appointment. Referral sent on 03/13/15.     Contact information:   [address] [place], [place] [address] Phone: [phone] Fax: [phone]      Follow up with [facility] On 03/21/2015.   Why:  Therapy appointment with Reita Chard on Wed. April 6th at 2:10 pm. Please call office if you need to reschedule appointment.   Contact information:   [address] [place], [place] [address] [phone]  fax [phone]      Patient denies SI/HI: Yes,  denies    Safety Planning and Suicide Prevention discussed: Yes,  with patient and mother  Have you used any form of tobacco in the last 30 days? (Cigarettes, Smokeless Tobacco, Cigars, and/or Pipes): No  Has patient been referred to the Quitline?: N/A patient is not a smoker  Murray Durrell, Casimiro Needle 03/15/2015, 9:49 AM

## 2015-03-15 NOTE — Progress Notes (Signed)
RN made aware of BP 

## 2015-03-15 NOTE — Progress Notes (Signed)
Discharge note: Pt received both written and verbal discharge instructions. Pt verbalized understanding of discharge instructions. Pt agreed to f/u appt and med regimen.  Pt received belongings from room and locker. Pt refused  sample meds. Pt received  prescriptions. Pt denies suicidal thoughts at time of discharge. Pt picked up by her mother. Pt safely lefts [place].
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
## 2015-03-15 NOTE — Progress Notes (Addendum)
D: Pt presents blunted in affect and depressed in mood. Pt verbally endorses anxiety. Pt is currently negative for any SI/HI/AVH. Pt verbally contracts for safety. Pt did not attend group, despite encouragement. Pt observed interacting with her roommate only.  A: Writer administered scheduled and prn medications to pt, per MD orders. Continued support and availability as needed was extended to this pt. Staff continue to monitor pt with q27min checks.  R: No adverse drug reactions noted. Pt receptive to treatment. Pt remains safe at this time.

## 2015-03-19 NOTE — Progress Notes (Signed)
Patient Discharge Instructions:  After Visit Summary (AVS):   Faxed to:  03/19/15 Discharge Summary Note:   Faxed to:  03/19/15 Psychiatric Admission Assessment Note:   Faxed to:  03/19/15 Suicide Risk Assessment - Discharge Assessment:   Faxed to:  03/19/15 Faxed/Sent to the [facility] Care provider:  03/19/15 Faxed to [facility] @ [phone] Faxed to [facility] @ [phone] No documentation was faxed to [facility] for [facility]IPS.  Per the SW/ROI the patient did not give consent to fax.  Patsey Berthold, 03/19/2015, 2:46 PM

## 2015-04-01 ENCOUNTER — Emergency Department (HOSPITAL_COMMUNITY)
Admission: EM | Admit: 2015-04-01 | Discharge: 2015-04-01 | Discharge status: Against Medical Advice | Payer: Medicare Other | Source: Home / Self Care
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
## 2015-04-01 NOTE — ED Notes (Signed)
Called patient and patient did not answer. 

## 2015-04-01 NOTE — ED Notes (Signed)
Name called again.  Patient is not here

## 2015-04-02 ENCOUNTER — Encounter: Payer: Self-pay | Admitting: Internal Medicine

## 2015-04-02 ENCOUNTER — Ambulatory Visit (INDEPENDENT_AMBULATORY_CARE_PROVIDER_SITE_OTHER): Payer: Medicare Other | Admitting: Internal Medicine

## 2015-04-02 VITALS — BP 135/93 | HR 98 | Temp 99.1°F | Ht 66.0 in | Wt 248.6 lb

## 2015-04-02 DIAGNOSIS — J069 Acute upper respiratory infection, unspecified: Secondary | ICD-10-CM | POA: Diagnosis not present

## 2015-04-02 MED ORDER — DEXTROMETHORPHAN-GUAIFENESIN 5-100 MG/5ML PO LIQD: 10.0000 mL | Freq: Two times a day (BID) | ORAL | Status: DC | PRN

## 2015-04-02 MED ORDER — HYDROCOD POLST-CPM POLST ER 10-8 MG/5ML PO SUER: 5.0000 mL | Freq: Two times a day (BID) | ORAL | Status: DC | PRN

## 2015-04-02 MED ORDER — PROMETHAZINE HCL 12.5 MG PO TABS: 12.5000 mg | ORAL_TABLET | Freq: Two times a day (BID) | ORAL | Status: DC | PRN

## 2015-04-02 NOTE — Assessment & Plan Note (Signed)
Likely viral gastroenteritis and URI.  Continue supportive care with nasal saline, Delsym, continue OTC meds for cold and flu, disc. Warm salt water gargles, stay hydrated and try to eat with broths, gatorade, ginger ale   Pt declined labs today  Will try phenerghan x 3 days prn, Delsym  F/u in 1 week if not improved

## 2015-04-02 NOTE — Progress Notes (Signed)
Internal Medicine Clinic Attending  Case discussed with Dr. McLean at the time of the visit.  We reviewed the resident's history and exam and pertinent patient test results.  I agree with the assessment, diagnosis, and plan of care documented in the resident's note. 

## 2015-04-02 NOTE — Progress Notes (Signed)
   Subjective:    Patient ID: [name], female    DOB: [birth year], 29 y.o.   MRN: [id number]  HPI Comments: 29 y.o significant psychiatric PMH  She presents for multiple sx's 1. Sore throat, stuffy nose, sneezing, coughing, yellow vomiting this am 4 am and qd 2-4 x for the last 3 days, diarrhea 3 x w/in 3 days.  She states yesterday she had a fever 102.8 (today AF 99.1). She has tried Dayquil and Nyquil and Ibuprofen.  Denies CP, SOB, +exposure to her brother as a sick contact but he is getting better and she feels she is getting slightly better.        Review of Systems  HENT: Positive for congestion and sore throat.   Respiratory: Positive for cough. Negative for shortness of breath.   Cardiovascular: Negative for chest pain.       Objective:   Physical Exam  Constitutional: She is oriented to person, place, and time. Vital signs are normal. She appears well-developed and well-nourished. She is cooperative. No distress.  HENT:  Head: Normocephalic and atraumatic.  Mouth/Throat: Oropharynx is clear and moist and mucous membranes are normal. No oropharyngeal exudate.  Mild tonsilar erythema no exudate   Eyes: Conjunctivae are normal. Right eye exhibits no discharge. Left eye exhibits no discharge. No scleral icterus.  Cardiovascular: Normal rate, regular rhythm and normal heart sounds.   No murmur heard. Neg edema   Pulmonary/Chest: Effort normal and breath sounds normal. No respiratory distress. She has no wheezes.  Abdominal: Soft. Bowel sounds are normal. There is no tenderness.  Neurological: She is alert and oriented to person, place, and time. Gait normal.  Skin: Skin is warm, dry and intact. No rash noted. She is not diaphoretic.  Psychiatric: She has a normal mood and affect. Her speech is normal and behavior is normal. Judgment and thought content normal. Cognition and memory are normal.  Flat affect   Nursing note and vitals reviewed.         Assessment  & Plan:  F/u in 1 week if not bette r

## 2015-04-02 NOTE — Patient Instructions (Addendum)
General Instructions: Please try phenerghan for nausea  Please try Delsym for cough  Please try warm salt water gargles  Please try nasal saline  Take care Return in 1 week if not better  Try to eat and drink    Treatment Goals:  Goals (1 Years of Data) as of 04/02/15    None      Progress Toward Treatment Goals:  No flowsheet data found.  Self Care Goals & Plans:  Self Care Goal 02/17/2013  Manage my medications take my medicines as prescribed  Eat healthy foods eat foods that are low in salt; eat baked foods instead of fried foods; eat smaller portions; eat more vegetables  Be physically active find an activity I enjoy    No flowsheet data found.   Care Management & Community Referrals:  Referral 04/02/2015  Referrals made for care management support -  Referrals made to community resources none       Upper Respiratory Infection, Adult An upper respiratory infection (URI) is also sometimes known as the common cold. The upper respiratory tract includes the nose, sinuses, throat, trachea, and bronchi. Bronchi are the airways leading to the lungs. Most people improve within 1 week, but symptoms can last up to 2 weeks. A residual cough may last even longer.  CAUSES Many different viruses can infect the tissues lining the upper respiratory tract. The tissues become irritated and inflamed and often become very moist. Mucus production is also common. A cold is contagious. You can easily spread the virus to others by oral contact. This includes kissing, sharing a glass, coughing, or sneezing. Touching your mouth or nose and then touching a surface, which is then touched by another person, can also spread the virus. SYMPTOMS  Symptoms typically develop 1 to 3 days after you come in contact with a cold virus. Symptoms vary from person to person. They may include:  Runny nose.  Sneezing.  Nasal congestion.  Sinus irritation.  Sore throat.  Loss of voice  (laryngitis).  Cough.  Fatigue.  Muscle aches.  Loss of appetite.  Headache.  Low-grade fever. DIAGNOSIS  You might diagnose your own cold based on familiar symptoms, since most people get a cold 2 to 3 times a year. Your caregiver can confirm this based on your exam. Most importantly, your caregiver can check that your symptoms are not due to another disease such as strep throat, sinusitis, pneumonia, asthma, or epiglottitis. Blood tests, throat tests, and X-rays are not necessary to diagnose a common cold, but they may sometimes be helpful in excluding other more serious diseases. Your caregiver will decide if any further tests are required. RISKS AND COMPLICATIONS  You may be at risk for a more severe case of the common cold if you smoke cigarettes, have chronic heart disease (such as heart failure) or lung disease (such as asthma), or if you have a weakened immune system. The very young and very old are also at risk for more serious infections. Bacterial sinusitis, middle ear infections, and bacterial pneumonia can complicate the common cold. The common cold can worsen asthma and chronic obstructive pulmonary disease (COPD). Sometimes, these complications can require emergency medical care and may be life-threatening. PREVENTION  The best way to protect against getting a cold is to practice good hygiene. Avoid oral or hand contact with people with cold symptoms. Wash your hands often if contact occurs. There is no clear evidence that vitamin C, vitamin E, echinacea, or exercise reduces the chance of developing  a cold. However, it is always recommended to get plenty of rest and practice good nutrition. TREATMENT  Treatment is directed at relieving symptoms. There is no cure. Antibiotics are not effective, because the infection is caused by a virus, not by bacteria. Treatment may include:  Increased fluid intake. Sports drinks offer valuable electrolytes, sugars, and fluids.  Breathing  heated mist or steam (vaporizer or shower).  Eating chicken soup or other clear broths, and maintaining good nutrition.  Getting plenty of rest.  Using gargles or lozenges for comfort.  Controlling fevers with ibuprofen or acetaminophen as directed by your caregiver.  Increasing usage of your inhaler if you have asthma. Zinc gel and zinc lozenges, taken in the first 24 hours of the common cold, can shorten the duration and lessen the severity of symptoms. Pain medicines may help with fever, muscle aches, and throat pain. A variety of non-prescription medicines are available to treat congestion and runny nose. Your caregiver can make recommendations and may suggest nasal or lung inhalers for other symptoms.  HOME CARE INSTRUCTIONS   Only take over-the-counter or prescription medicines for pain, discomfort, or fever as directed by your caregiver.  Use a warm mist humidifier or inhale steam from a shower to increase air moisture. This may keep secretions moist and make it easier to breathe.  Drink enough water and fluids to keep your urine clear or pale yellow.  Rest as needed.  Return to work when your temperature has returned to normal or as your caregiver advises. You may need to stay home longer to avoid infecting others. You can also use a face mask and careful hand washing to prevent spread of the virus. SEEK MEDICAL CARE IF:   After the first few days, you feel you are getting worse rather than better.  You need your caregiver's advice about medicines to control symptoms.  You develop chills, worsening shortness of breath, or brown or red sputum. These may be signs of pneumonia.  You develop yellow or brown nasal discharge or pain in the face, especially when you bend forward. These may be signs of sinusitis.  You develop a fever, swollen neck glands, pain with swallowing, or white areas in the back of your throat. These may be signs of strep throat. SEEK IMMEDIATE MEDICAL CARE  IF:   You have a fever.  You develop severe or persistent headache, ear pain, sinus pain, or chest pain.  You develop wheezing, a prolonged cough, cough up blood, or have a change in your usual mucus (if you have chronic lung disease).  You develop sore muscles or a stiff neck. Document Released: 05/27/2001 Document Revised: 02/23/2012 Document Reviewed: 03/08/2014 [facility] Patient Information 2015 [place], [place]. This information is not intended to replace advice given to you by your health care provider. Make sure you discuss any questions you have with your health care provider.

## 2015-04-24 ENCOUNTER — Ambulatory Visit (INDEPENDENT_AMBULATORY_CARE_PROVIDER_SITE_OTHER): Payer: Medicare Other | Admitting: Internal Medicine

## 2015-04-24 VITALS — BP 120/57 | HR 73 | Temp 98.8°F | Resp 20 | Ht 65.0 in | Wt 246.9 lb

## 2015-04-24 DIAGNOSIS — A630 Anogenital (venereal) warts: Secondary | ICD-10-CM | POA: Insufficient documentation

## 2015-04-24 MED ORDER — PODOFILOX 0.5 % EX GEL: Freq: Two times a day (BID) | CUTANEOUS | Status: DC

## 2015-04-24 NOTE — Patient Instructions (Signed)
General Instructions:   Please try to bring all your medicines next time. This will help [place] keep you safe from mistakes.   Progress Toward Treatment Goals:  No flowsheet data found.  Self Care Goals & Plans:  Self Care Goal 02/17/2013  Manage my medications take my medicines as prescribed  Eat healthy foods eat foods that are low in salt; eat baked foods instead of fried foods; eat smaller portions; eat more vegetables  Be physically active find an activity I enjoy    No flowsheet data found.   Care Management & Community Referrals:  Referral 04/02/2015  Referrals made for care management support -  Referrals made to community resources none       Genital Warts Genital warts are caused by a germ (human papillomavirus, HPV). This germ is spread by having unprotected sex (intercourse) with an infected person. It can be spread by vaginal, anal, and oral sex. A person who is infected might not show any signs or problems. HOME CARE  Follow your doctor's treatment instructions.  Do not use medicine that is meant for hand warts. Only take medicine as told by your doctor.  Tell your past and current sex partner(s) that you have genital warts. They may need treatment.  Avoid sexual contact while you are being treated.  Do not touch or scratch the warts. You could spread it to other parts of your body.  Women with genital warts should have a cervical cancer check (Pap test) at least once a year.  Tell your doctor if you become pregnant. The germ can be passed to the baby.  After treatment, use a condom during sex.  Ask your doctor before using anti-itch creams. GET HELP RIGHT AWAY IF:   Your treated skin becomes red, puffy (swollen), or painful.  You have a fever.  You feel generally sick.  You feel little lumps in and around your genital area.  You are bleeding or have pain during sex. MAKE SURE YOU:   Understand these instructions.  Will watch your  condition.  Will get help right away if you are not doing well or get worse. Document Released: 02/25/2010 Document Revised: 02/23/2012 Document Reviewed: 06/09/2011 [facility] Patient Information 2015 [place], [place]. This information is not intended to replace advice given to you by your health care provider. Make sure you discuss any questions you have with your health care provider.

## 2015-04-24 NOTE — Progress Notes (Signed)
INTERNAL MEDICINE TEACHING ATTENDING ADDENDUM - Joe Tanney, MD: I reviewed and discussed at the time of visit with the resident Dr. Sadek, the patient's medical history, physical examination, diagnosis and results of pertinent tests and treatment and I agree with the patient's care as documented.  

## 2015-04-24 NOTE — Progress Notes (Signed)
Subjective:   Patient ID: [name] female   DOB: [birth year] 29 y.o.   MRN: [id number]  HPI: [name] is a 29 y.o. woman pmh as listed below presents for new vaginal warts.   The patient had a new sexual partner about a month ago and then broke out with some tender vaginal masses. She does not have any abnormal vaginal discharge, pelvic pain, abdominal pain, fevers or chills, rashes, or joint pain. She has had multiple unprotected sexual encounters. The patient has not had any abnormal vaginal bleeding. She has a recent stressful event including her mother being admitted to hospice care today. She has not had any anal sex encounters or anal masses along with no diarrhea or constipation.   Past Medical History  Diagnosis Date  . Bipolar disorder   . Attention deficit hyperactivity disorder   . Depression   . Anxiety   . Asthma   . UTI (urinary tract infection)     Completed Cipro 08/06/12  . Gastritis    Current Outpatient Prescriptions  Medication Sig Dispense Refill  . albuterol (PROVENTIL HFA;VENTOLIN HFA) 108 (90 BASE) MCG/ACT inhaler Inhale 2 puffs into the lungs every 6 (six) hours as needed for wheezing or shortness of breath.     [name] asenapine (SAPHRIS) 5 MG SUBL 24 hr tablet Place 5 mg under the tongue at bedtime.    . carbamazepine (TEGRETOL XR) 400 MG 12 hr tablet Take 1 tablet (400 mg total) by mouth 2 (two) times daily. 60 tablet 0  . citalopram (CELEXA) 40 MG tablet Take 1 tablet (40 mg total) by mouth every morning. For depression 30 tablet 0  . Dextromethorphan-Guaifenesin (DELSYM COUGH/CHEST CONGEST DM) 5-100 MG/5ML LIQD Take 10 mLs by mouth 2 (two) times daily as needed. 177 mL 0  . hydrOXYzine (ATARAX/VISTARIL) 25 MG tablet Take 1 tablet (25 mg total) by mouth every 6 (six) hours as needed for anxiety. 30 tablet 0  . podofilox (CONDYLOX) 0.5 % gel Apply topically 2 (two) times daily. 3.5 g 0  . promethazine (PHENERGAN) 12.5 MG tablet Take 1 tablet  (12.5 mg total) by mouth 2 (two) times daily as needed for nausea or vomiting. 6 tablet 0  . traZODone (DESYREL) 150 MG tablet Take 1 tablet (150 mg total) by mouth at bedtime. 30 tablet 0   No current facility-administered medications for this visit.   Family History  Problem Relation Age of Onset  . Depression Mother   . Depression Father   . Depression Brother   . Colon cancer Mother   . Colon polyps Mother   . Liver disease    . Kidney disease     History   Social History  . Marital Status: Single    Spouse Name: N/A  . Number of Children: 0  . Years of Education: N/A   Social History Main Topics  . Smoking status: Never Smoker   . Smokeless tobacco: Never Used  . Alcohol Use: No  . Drug Use: No  . Sexual Activity: Yes    Birth Control/ Protection: None   Other Topics Concern  . Not on file   Social History Narrative   Review of Systems: Pertinent items are noted in HPI. Objective:  Physical Exam: Filed Vitals:   04/24/15 1428  BP: 120/57  Pulse: 73  Temp: 98.8 F (37.1 C)  TempSrc: Oral  Resp: 20  Height: 5\' 5"  (1.651 m)  Weight: 246 lb 14.4 oz (111.993 kg)  SpO2:  99%   General: sitting in chair, NAD HEENT: PERRL, EOMI, no scleral icterus Cardiac: RRR, no rubs, murmurs or gallops Pulm: clear to auscultation bilaterally, moving normal volumes of air Abd: soft, nontender, nondistended, BS present Ext: warm and well perfused, no pedal edema Female genitalia: not done Vagina: not indicated, vaginal mass in the 6 o'clock position multpile and small pedunculated, vaginal tenderness along wall no lesions or tears or bleeding and Pelvic Floor Exam no cystocele, rectocele or prolapse noted Rectal: not indicated and no visible warts exam limited by tenderness rectovaginal exam confirms above findings   Assessment & Plan:  Please see problem oriented charting  Pt discussed with Dr. Dareen Piano

## 2015-04-24 NOTE — Assessment & Plan Note (Signed)
This is likely a result of recent new sexual partner. The patient adamantly refuses all other STI checks. Pelvic exam was limited by pain and Pap smear was unable to be performed. 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 -Podofilox topical gel -refer to ob/gyn if fails to improve -education and discussion regarding importance of using sexual protection either female or female condoms was had with the patient

## 2015-05-10 ENCOUNTER — Ambulatory Visit: Payer: Self-pay | Admitting: Internal Medicine

## 2015-05-26 ENCOUNTER — Emergency Department (INDEPENDENT_AMBULATORY_CARE_PROVIDER_SITE_OTHER)
Admission: EM | Admit: 2015-05-26 | Discharge: 2015-05-26 | Disposition: A | Discharge status: Home / Self Care | Payer: Medicare Other | Source: Home / Self Care | Attending: Family Medicine | Admitting: Family Medicine

## 2015-05-26 ENCOUNTER — Encounter (HOSPITAL_COMMUNITY): Payer: Self-pay | Admitting: Emergency Medicine

## 2015-05-26 DIAGNOSIS — Z711 Person with feared health complaint in whom no diagnosis is made: Secondary | ICD-10-CM

## 2015-05-26 DIAGNOSIS — Z3202 Encounter for pregnancy test, result negative: Secondary | ICD-10-CM

## 2015-05-26 LAB — POCT PREGNANCY, URINE: Preg Test, Ur: NEGATIVE

## 2015-05-26 LAB — POCT URINALYSIS DIP (DEVICE)
Glucose, UA: NEGATIVE mg/dL
Hgb urine dipstick: NEGATIVE
Ketones, ur: NEGATIVE mg/dL
Leukocytes, UA: NEGATIVE
Nitrite: NEGATIVE
Protein, ur: 100 mg/dL — AB
Specific Gravity, Urine: >=1.03 (ref 1.005–1.030)
Urobilinogen, UA: 1 mg/dL (ref 0.0–1.0)
pH: 6 (ref 5.0–8.0)

## 2015-05-26 NOTE — Discharge Instructions (Signed)
Pregnancy Tests HOW DO PREGNANCY TESTS WORK? All pregnancy tests look for a special hormone in the urine or blood that is only present in pregnant women. This hormone, human chorionic gonadotropin (hCG), is also called the pregnancy hormone.  WHAT IS THE DIFFERENCE BETWEEN A URINE AND A BLOOD PREGNANCY TEST? IS ONE BETTER THAN THE OTHER? There are two types of pregnancy tests.  Blood tests.  Urine tests. Both tests look for the presence of hCG, the pregnancy hormone. Many women use a urine test or home pregnancy test (HPT) to find out if they are pregnant. HPTs are cheap, easy to use, can be done at home, and are private. When a woman has a positive result on an HPT, she needs to see her caregiver right away. The caregiver can confirm a positive HPT result with another urine test, a blood test, ultrasound, and a pelvic exam.  There are two types of blood tests you can get from a caregiver.   A quantitative blood test (or the beta hCG test). This test measures the exact amount of hCG in the blood. This means it can pick up very small amounts of hCG, making it a very accurate test.  A qualitative hCG blood test. This test gives a simple yes or no answer to whether you are pregnant. This test is more like a urine test in terms of its accuracy. Blood tests can pick up hCG earlier in a pregnancy than urine tests can. Blood tests can tell if you are pregnant about 6 to 8 days after you release an egg from an ovary (ovulate). Urine tests can determine pregnancy about 2 weeks after ovulation.  HOW IS A HOME PREGNANCY TEST DONE?  There are many types of home pregnancy tests or HPTs that can be bought over-the-counter at drug or discount stores.   Some involve collecting your urine in a cup and dipping a stick into the urine or putting some of the urine into a special container with an eyedropper.  Others are done by placing a stick into your urine stream.  Tests vary in how long you need to wait for  the stick or container to turn a certain color or have a symbol on it (like a plus or a minus).  All tests come with written instructions. Most tests also have toll-free phone numbers to call if you have any questions about how to do the test or read the results. HOW ACCURATE ARE HOME PREGNANCY TESTS?  HPTs are very accurate. Most brands of HPTs say they are 97% to 99% accurate when taken 1 week after missing your menstrual period, but this can vary with actual use. Each brand varies in how sensitive it is in picking up the pregnancy hormone hCG. If a test is not done correctly, it will be less accurate. Always check the package to make sure it is not past its expiration date. If it is, it will not be accurate. Most brands of HPTs tell users to do the test again in a few days, no matter what the results.  If you use an HPT too early in your pregnancy, you may not have enough of the pregnancy hormone hCG in your urine to have a positive test result. Most HPTs will be accurate if you test yourself around the time your period is due (about 2 weeks after you ovulate). You can get a negative test result if you are not pregnant or if you ovulated later than you thought you did.  You may also have problems with the pregnancy, which affects the amount of hCG you have in your urine. If your HPT is negative, test yourself again within a few days to 1 week. If you keep getting a negative result and think you are pregnant, talk with your caregiver right away about getting a blood pregnancy test.  FALSE POSITIVE PREGNANCY TEST A false positive HPT can happen if there is blood or protein present in your urine. A false positive can also happen if you were recently pregnant or if you take a pregnancy test too soon after taking fertility drug that contains hCG. Also, some prescription medicines such as water pills (diuretics), tranquilizers, seizure medicines, psychiatric medicines, and allergy and nausea medicines  (promethazine) give false positive readings. FALSE NEGATIVE PREGNANCY TEST  A false negative HPT can happen if you do the test too early. Try to wait until you are at least 1 day late for your menstrual period.  It may happen if you wait too long to test the urine (longer than 15 minutes).  It may also happen if the urine is too diluted because you drank a lot of fluids before getting the urine sample. It is best to test the first morning urine after you get out of bed. If your menstrual period did not start after a week of a negative HPT, repeat the pregnancy test. CAN ANYTHING INTERFERE WITH HOME PREGNANCY TEST RESULTS?  Most medicines, both over-the-counter and prescription drugs, including birth control pills and antibiotics, should not affect the results of a HPT. Only those drugs that have the pregnancy hormone hCG in them can give a false positive test result. Drugs that have hCG in them may be used for treating infertility (not being able to get pregnant). Alcohol and illegal drugs do not affect HPT results, but you should not be using these substances if you are trying to get pregnant. If you have a positive pregnancy test, call your caregiver to make an appointment to begin prenatal care. Document Released: 12/04/2003 Document Revised: 02/23/2012 Document Reviewed: 03/17/2014 [facility] Patient Information 2015 [place], [place]. This information is not intended to replace advice given to you by your health care provider. Make sure you discuss any questions you have with your health care provider.

## 2015-05-26 NOTE — ED Provider Notes (Signed)
CSN: [id number]     Arrival date & time 05/26/15  1954 History   First MD Initiated Contact with Patient 05/26/15 2126     Chief Complaint  Patient presents with  . Possible Pregnancy   (Consider location/radiation/quality/duration/timing/severity/associated sxs/prior Treatment) HPI Comments: 29 year old female states she is trying to get pregnant. She states that her menses within likely would have occurred this week. She did have 2 days of menstrual bleeding that ended yesterday.. She comes to the urgent care for a pregnancy test. She states she did not consider going to the department or seeing her PCP or purchasing a home pregnancy test. No other complaints.  Patient is a 29 y.o. female presenting with pregnancy problem.  Possible Pregnancy    Past Medical History  Diagnosis Date  . Bipolar disorder   . Attention deficit hyperactivity disorder   . Depression   . Anxiety   . Asthma   . UTI (urinary tract infection)     Completed Cipro 08/06/12  . Gastritis    Past Surgical History  Procedure Laterality Date  . Mouth surgery    . Eye muscle surgery Bilateral     07/29/12   Family History  Problem Relation Age of Onset  . Depression Mother   . Depression Father   . Depression Brother   . Colon cancer Mother   . Colon polyps Mother   . Liver disease    . Kidney disease     History  Substance Use Topics  . Smoking status: Never Smoker   . Smokeless tobacco: Never Used  . Alcohol Use: No   OB History    Gravida Para Term Preterm AB TAB SAB Ectopic Multiple Living   0 0             Review of Systems  Constitutional: Negative.   All other systems reviewed and are negative.   Allergies  Depakote; Methylphenidate derivatives; Neurontin; and Prozac  Home Medications   Prior to Admission medications   Medication Sig Start Date End Date Taking? Authorizing Provider  albuterol (PROVENTIL HFA;VENTOLIN HFA) 108 (90 BASE) MCG/ACT inhaler Inhale 2 puffs into the  lungs every 6 (six) hours as needed for wheezing or shortness of breath.     Historical Provider, MD  asenapine (SAPHRIS) 5 MG SUBL 24 hr tablet Place 5 mg under the tongue at bedtime.    Historical Provider, MD  carbamazepine (TEGRETOL XR) 400 MG 12 hr tablet Take 1 tablet (400 mg total) by mouth 2 (two) times daily. 02/14/14   Niel Hummer, NP  citalopram (CELEXA) 40 MG tablet Take 1 tablet (40 mg total) by mouth every morning. For depression 03/15/15   Shuvon B Rankin, NP  Dextromethorphan-Guaifenesin (DELSYM COUGH/CHEST CONGEST DM) 5-100 MG/5ML LIQD Take 10 mLs by mouth 2 (two) times daily as needed. 04/02/15   Cresenciano Genre, MD  hydrOXYzine (ATARAX/VISTARIL) 25 MG tablet Take 1 tablet (25 mg total) by mouth every 6 (six) hours as needed for anxiety. 03/15/15   Shuvon B Rankin, NP  podofilox (CONDYLOX) 0.5 % gel Apply topically 2 (two) times daily. 04/24/15   Jerrye Noble, MD  promethazine (PHENERGAN) 12.5 MG tablet Take 1 tablet (12.5 mg total) by mouth 2 (two) times daily as needed for nausea or vomiting. 04/02/15   Cresenciano Genre, MD  traZODone (DESYREL) 150 MG tablet Take 1 tablet (150 mg total) by mouth at bedtime. 03/15/15   Shuvon B Rankin, NP   BP 113/76 mmHg  Pulse 99  Temp(Src) 98.3 F (36.8 C) (Oral)  Resp 16  SpO2 96% Physical Exam  Constitutional: She is oriented to person, place, and time. She appears well-developed and well-nourished. No distress.  Neck: Normal range of motion. Neck supple.  Pulmonary/Chest: Effort normal. No respiratory distress.  Musculoskeletal: She exhibits no edema.  Neurological: She is alert and oriented to person, place, and time.  Skin: Skin is warm and dry.  Psychiatric: She has a normal mood and affect.  Nursing note and vitals reviewed.   ED Course  Procedures (including critical care time) Labs Review Labs Reviewed  POCT URINALYSIS DIP (DEVICE) - Abnormal; Notable for the following:    Bilirubin Urine SMALL (*)    Protein, ur 100 (*)    All  other components within normal limits  POCT PREGNANCY, URINE    Imaging Review No results found.   MDM   1. Worried well   2. Negative pregnancy test    Patient advised to speak to her PCP prior to getting pregnant. She is on medications that may be incompatible with healthy pregnancy. Pregnancy test negative.    Janne Napoleon, NP 05/26/15 2135

## 2015-05-26 NOTE — ED Notes (Signed)
Here for pregnancy test Unaware when last menstrual cycle

## 2015-06-14 ENCOUNTER — Inpatient Hospital Stay (HOSPITAL_COMMUNITY)
Admission: AD | Admit: 2015-06-14 | Discharge: 2015-06-14 | Disposition: A | Discharge status: Home / Self Care | Payer: Medicare Other | Source: Ambulatory Visit | Attending: Obstetrics & Gynecology | Admitting: Obstetrics & Gynecology

## 2015-06-14 ENCOUNTER — Encounter (HOSPITAL_COMMUNITY): Payer: Self-pay | Admitting: *Deleted

## 2015-06-14 DIAGNOSIS — Z3201 Encounter for pregnancy test, result positive: Secondary | ICD-10-CM | POA: Diagnosis not present

## 2015-06-14 DIAGNOSIS — Z3200 Encounter for pregnancy test, result unknown: Secondary | ICD-10-CM | POA: Diagnosis present

## 2015-06-14 LAB — POCT PREGNANCY, URINE: Preg Test, Ur: POSITIVE — AB

## 2015-06-14 NOTE — MAU Note (Addendum)
Needs confirmation.  +HPT prior to coming.  No complaints or problems, just wants confirmation

## 2015-06-14 NOTE — Discharge Instructions (Signed)
Safe Medications in Pregnancy   Acne: Benzoyl Peroxide Salicylic Acid  Backache/Headache: Tylenol: 2 regular strength every 4 hours OR              2 Extra strength every 6 hours  Colds/Coughs/Allergies: Benadryl (alcohol free) 25 mg every 6 hours as needed Breath right strips Claritin Cepacol throat lozenges Chloraseptic throat spray Cold-Eeze- up to three times per day Cough drops, alcohol free Flonase (by prescription only) Guaifenesin Mucinex Robitussin DM (plain only, alcohol free) Saline nasal spray/drops Sudafed (pseudoephedrine) & Actifed ** use only after [redacted] weeks gestation and if you do not have high blood pressure Tylenol Vicks Vaporub Zinc lozenges Zyrtec   Constipation: Colace Ducolax suppositories Fleet enema Glycerin suppositories Metamucil Milk of magnesia Miralax Senokot Smooth move tea  Diarrhea: Kaopectate Imodium A-D  *NO pepto Bismol  Hemorrhoids: Anusol Anusol HC Preparation H Tucks  Indigestion: Tums Maalox Mylanta Zantac  Pepcid  Insomnia: Benadryl (alcohol free) 25mg  every 6 hours as needed Tylenol PM Unisom, no Gelcaps  Leg Cramps: Tums MagGel  Nausea/Vomiting:  Bonine Dramamine Emetrol Ginger extract Sea bands Meclizine  Nausea medication to take during pregnancy:  Unisom (doxylamine succinate 25 mg tablets) Take one tablet daily at bedtime. If symptoms are not adequately controlled, the dose can be increased to a maximum recommended dose of two tablets daily (1/2 tablet in the morning, 1/2 tablet mid-afternoon and one at bedtime). Vitamin B6 100mg  tablets. Take one tablet twice a day (up to 200 mg per day).  Skin Rashes: Aveeno products Benadryl cream or 25mg  every 6 hours as needed Calamine Lotion 1% cortisone cream  Yeast infection: Gyne-lotrimin 7 Monistat 7   **If taking multiple medications, please check labels to avoid duplicating the same active ingredients **take medication as directed on  the label ** Do not exceed 4000 mg of tylenol in 24 hours **Do not take medications that contain aspirin or ibuprofen    ________________________________________     To schedule your Maternity Eligibility Appointment, please call [phone].  When you arrive for your appointment you must bring the following items or information listed below.  Your appointment will be rescheduled if you do not have these items or are 15 minutes late. If currently receiving Medicaid, you MUST bring: 1. Medicaid Card 2. Social Security Card 3. Picture ID 4. Proof of Pregnancy 5. Verification of current address if the address on Medicaid card is incorrect "postmarked mail" If not receiving Medicaid, you MUST bring: 1. Social Security Card 2. Picture ID 3. Birth Certificate (if available) Passport or *Green Card 4. Proof of Pregnancy 5. Verification of current address "postmarked mail" for each income presented. 6. Verification of insurance coverage, if any 7. Check stubs from each employer for the previous month (if unable to present check stub  for each week, we will accept check stub for the first and last week ill the same month.) If you can't locate check stubs, you must bring a letter from the employer(s) and it must have the following information on letterhead, typed, in English: o name of [employer] telephone number o how long been with the company, if less than one month o how much person earns per hour o how many hours per week work o the gross pay the person earned for the previous month If you are 28 years old or less, you do not have to bring proof of income unless you work or live with the father of the baby and at that time we  will need proof of income from you and/or the father of the baby. Green Card recipients are eligible for Medicaid for Pregnant Women (MPW)

## 2015-06-14 NOTE — MAU Provider Note (Signed)
History     CSN: [id number]  Arrival date and time: 06/14/15 1218   None     Chief Complaint  Patient presents with  . Possible Pregnancy   HPI  Pt is G1P0 who presents for confirmation of pregnancy. LMP 05/12/2015 @ [redacted]w[redacted]d [place]. Pt denies any cramping or spotting/bleeding. Pt c/o of having weird cravings and being tired. Pt is [facility] patient with bipolar and ADHD and anxiety- on Tegretol and   RN note:  Addendum  MAU Note 06/14/2015 12:44 PM    Expand All Collapse All   Needs confirmation. +HPT prior to coming. No complaints or problems, just wants confirmation       Past Medical History  Diagnosis Date  . Bipolar disorder   . Attention deficit hyperactivity disorder   . Depression   . Anxiety   . Asthma   . UTI (urinary tract infection)     Completed Cipro 08/06/12  . Gastritis     Past Surgical History  Procedure Laterality Date  . Mouth surgery    . Eye muscle surgery Bilateral     07/29/12    Family History  Problem Relation Age of Onset  . Depression Mother   . Depression Father   . Depression Brother   . Colon cancer Mother   . Colon polyps Mother   . Liver disease    . Kidney disease      History  Substance Use Topics  . Smoking status: Never Smoker   . Smokeless tobacco: Never Used  . Alcohol Use: No    Allergies:  Allergies  Allergen Reactions  . Depakote [Divalproex Sodium] Other (See Comments)    Pt had h/o intolerance from Depakote. Stated that she never wants this med."Blood level was toxic"  . Methylphenidate Derivatives Other (See Comments)    Gets depressed and lots of anger  . Neurontin [Gabapentin] Other (See Comments)    Pt reports extreme dizziness  . Prozac [Fluoxetine Hcl] Other (See Comments)    Reaction: anger    Prescriptions prior to admission  Medication Sig Dispense Refill Last Dose  . albuterol (PROVENTIL HFA;VENTOLIN HFA) 108 (90 BASE) MCG/ACT inhaler Inhale 2 puffs into the lungs every 6 (six) hours as needed for  wheezing or shortness of breath.    Taking  . asenapine (SAPHRIS) 5 MG SUBL 24 hr tablet Place 5 mg under the tongue at bedtime.   Taking  . carbamazepine (TEGRETOL XR) 400 MG 12 hr tablet Take 1 tablet (400 mg total) by mouth 2 (two) times daily. 60 tablet 0 Taking  . citalopram (CELEXA) 40 MG tablet Take 1 tablet (40 mg total) by mouth every morning. For depression 30 tablet 0 Taking  . Dextromethorphan-Guaifenesin (DELSYM COUGH/CHEST CONGEST DM) 5-100 MG/5ML LIQD Take 10 mLs by mouth 2 (two) times daily as needed. 177 mL 0 Taking  . hydrOXYzine (ATARAX/VISTARIL) 25 MG tablet Take 1 tablet (25 mg total) by mouth every 6 (six) hours as needed for anxiety. 30 tablet 0 Taking  . podofilox (CONDYLOX) 0.5 % gel Apply topically 2 (two) times daily. 3.5 g 0   . promethazine (PHENERGAN) 12.5 MG tablet Take 1 tablet (12.5 mg total) by mouth 2 (two) times daily as needed for nausea or vomiting. 6 tablet 0   . traZODone (DESYREL) 150 MG tablet Take 1 tablet (150 mg total) by mouth at bedtime. 30 tablet 0 Taking    Review of Systems  Constitutional: Negative for fever and chills.  Gastrointestinal: Negative for  nausea, vomiting, abdominal pain, diarrhea and constipation.  Psychiatric/Behavioral: Positive for depression. The patient is nervous/anxious.    Physical Exam   Blood pressure 126/90, pulse 96, temperature 98.8 F (37.1 C), temperature source Oral, resp. rate 20, height 5\' 6"  (1.676 m), weight 232 lb (105.235 kg), last menstrual period 05/12/2015.  Physical Exam  Nursing note and vitals reviewed. Constitutional: She is oriented to person, place, and time. She appears well-developed and well-nourished. No distress.  HENT:  Head: Normocephalic.  Eyes: Pupils are equal, round, and reactive to light.  Neck: Normal range of motion. Neck supple.  Cardiovascular: Normal rate.   GI: Soft.  Musculoskeletal: Normal range of motion.  Neurological: She is alert and oriented to person, place, and  time.  Skin: Skin is warm and dry.  Psychiatric: She has a normal mood and affect.    MAU Course  Procedures Results for orders placed or performed during the hospital encounter of 06/14/15 (from the past 24 hour(s))  Pregnancy, urine POC     Status: Abnormal   Collection Time: 06/14/15 12:58 PM  Result Value Ref Range   Preg Test, Ur POSITIVE (A) NEGATIVE   Reviewed medications with pt- advised to f/u with psychiatrist to get alternatives for Tegretol Discussed not to take Ativan; pt can take Vistaril for anxiety Advised Prenatal vitamins - to get OB appointment early Assessment and Plan  Positive pregnancy test F/u with psychiatrist and OB care  [facility] 06/14/2015, 2:32 PM

## 2015-06-14 NOTE — MAU Note (Signed)
Urine in lab 

## 2015-06-17 ENCOUNTER — Encounter (HOSPITAL_COMMUNITY): Payer: Self-pay | Admitting: *Deleted

## 2015-06-17 ENCOUNTER — Inpatient Hospital Stay (HOSPITAL_COMMUNITY)
Admission: AD | Admit: 2015-06-17 | Discharge: 2015-06-17 | Disposition: A | Discharge status: Home / Self Care | Payer: Medicare Other | Source: Ambulatory Visit | Attending: Obstetrics & Gynecology | Admitting: Obstetrics & Gynecology

## 2015-06-17 ENCOUNTER — Inpatient Hospital Stay (HOSPITAL_COMMUNITY): Payer: Medicare Other

## 2015-06-17 DIAGNOSIS — Z3A01 Less than 8 weeks gestation of pregnancy: Secondary | ICD-10-CM | POA: Insufficient documentation

## 2015-06-17 DIAGNOSIS — N8320 Unspecified ovarian cysts: Secondary | ICD-10-CM | POA: Diagnosis not present

## 2015-06-17 DIAGNOSIS — O26891 Other specified pregnancy related conditions, first trimester: Secondary | ICD-10-CM | POA: Diagnosis not present

## 2015-06-17 DIAGNOSIS — O26851 Spotting complicating pregnancy, first trimester: Secondary | ICD-10-CM

## 2015-06-17 DIAGNOSIS — O3680X0 Pregnancy with inconclusive fetal viability, not applicable or unspecified: Secondary | ICD-10-CM

## 2015-06-17 LAB — CBC
HCT: 39.7 % (ref 36.0–46.0)
Hemoglobin: 12.9 g/dL (ref 12.0–15.0)
MCH: 30.9 pg (ref 26.0–34.0)
MCHC: 32.5 g/dL (ref 30.0–36.0)
MCV: 95.2 fL (ref 78.0–100.0)
Platelets: 171 10*3/uL (ref 150–400)
RBC: 4.17 MIL/uL (ref 3.87–5.11)
RDW: 12.9 % (ref 11.5–15.5)
WBC: 6.5 10*3/uL (ref 4.0–10.5)

## 2015-06-17 LAB — URINALYSIS, ROUTINE W REFLEX MICROSCOPIC
Bilirubin Urine: NEGATIVE
Glucose, UA: NEGATIVE mg/dL
Hgb urine dipstick: NEGATIVE
Ketones, ur: NEGATIVE mg/dL
Leukocytes, UA: NEGATIVE
Nitrite: NEGATIVE
Protein, ur: NEGATIVE mg/dL
Specific Gravity, Urine: >1.03 — ABNORMAL HIGH (ref 1.005–1.030)
Urobilinogen, UA: 0.2 mg/dL (ref 0.0–1.0)
pH: 5.5 (ref 5.0–8.0)

## 2015-06-17 LAB — WET PREP, GENITAL
Trich, Wet Prep: NONE SEEN
Yeast Wet Prep HPF POC: NONE SEEN

## 2015-06-17 LAB — HCG, QUANTITATIVE, PREGNANCY: hCG, Beta Chain, Quant, S: 607 m[IU]/mL — ABNORMAL HIGH (ref <5)

## 2015-06-17 NOTE — Discharge Instructions (Signed)
First Trimester of Pregnancy The first trimester of pregnancy is from week 1 until the end of week 12 (months 1 through 3). A week after a sperm fertilizes an egg, the egg will implant on the wall of the uterus. This embryo will begin to develop into a baby. Genes from you and your partner are forming the baby. The female genes determine whether the baby is a boy or a girl. At 6-8 weeks, the eyes and face are formed, and the heartbeat can be seen on ultrasound. At the end of 12 weeks, all the baby's organs are formed.  Now that you are pregnant, you will want to do everything you can to have a healthy baby. Two of the most important things are to get good prenatal care and to follow your health care provider's instructions. Prenatal care is all the medical care you receive before the baby's birth. This care will help prevent, find, and treat any problems during the pregnancy and childbirth. BODY CHANGES Your body goes through many changes during pregnancy. The changes vary from woman to woman.   You may gain or lose a couple of pounds at first.  You may feel sick to your stomach (nauseous) and throw up (vomit). If the vomiting is uncontrollable, call your health care provider.  You may tire easily.  You may develop headaches that can be relieved by medicines approved by your health care provider.  You may urinate more often. Painful urination may mean you have a bladder infection.  You may develop heartburn as a result of your pregnancy.  You may develop constipation because certain hormones are causing the muscles that push waste through your intestines to slow down.  You may develop hemorrhoids or swollen, bulging veins (varicose veins).  Your breasts may begin to grow larger and become tender. Your nipples may stick out more, and the tissue that surrounds them (areola) may become darker.  Your gums may bleed and may be sensitive to brushing and flossing.  Dark spots or blotches (chloasma,  mask of pregnancy) may develop on your face. This will likely fade after the baby is born.  Your menstrual periods will stop.  You may have a loss of appetite.  You may develop cravings for certain kinds of food.  You may have changes in your emotions from day to day, such as being excited to be pregnant or being concerned that something may go wrong with the pregnancy and baby.  You may have more vivid and strange dreams.  You may have changes in your hair. These can include thickening of your hair, rapid growth, and changes in texture. Some women also have hair loss during or after pregnancy, or hair that feels dry or thin. Your hair will most likely return to normal after your baby is born. WHAT TO EXPECT AT YOUR PRENATAL VISITS During a routine prenatal visit:  You will be weighed to make sure you and the baby are growing normally.  Your blood pressure will be taken.  Your abdomen will be measured to track your baby's growth.  The fetal heartbeat will be listened to starting around week 10 or 12 of your pregnancy.  Test results from any previous visits will be discussed. Your health care provider may ask you:  How you are feeling.  If you are feeling the baby move.  If you have had any abnormal symptoms, such as leaking fluid, bleeding, severe headaches, or abdominal cramping.  If you have any questions. Other tests   that may be performed during your first trimester include:  Blood tests to find your blood type and to check for the presence of any previous infections. They will also be used to check for low iron levels (anemia) and Rh antibodies. Later in the pregnancy, blood tests for diabetes will be done along with other tests if problems develop.  Urine tests to check for infections, diabetes, or protein in the urine.  An ultrasound to confirm the proper growth and development of the baby.  An amniocentesis to check for possible genetic problems.  Fetal screens for  spina bifida and Down syndrome.  You may need other tests to make sure you and the baby are doing well. HOME CARE INSTRUCTIONS  Medicines  Follow your health care provider's instructions regarding medicine use. Specific medicines may be either safe or unsafe to take during pregnancy.  Take your prenatal vitamins as directed.  If you develop constipation, try taking a stool softener if your health care provider approves. Diet  Eat regular, well-balanced meals. Choose a variety of foods, such as meat or vegetable-based protein, fish, milk and low-fat dairy products, vegetables, fruits, and whole grain breads and cereals. Your health care provider will help you determine the amount of weight gain that is right for you.  Avoid raw meat and uncooked cheese. These carry germs that can cause birth defects in the baby.  Eating four or five small meals rather than three large meals a day may help relieve nausea and vomiting. If you start to feel nauseous, eating a few soda crackers can be helpful. Drinking liquids between meals instead of during meals also seems to help nausea and vomiting.  If you develop constipation, eat more high-fiber foods, such as fresh vegetables or fruit and whole grains. Drink enough fluids to keep your urine clear or pale yellow. Activity and Exercise  Exercise only as directed by your health care provider. Exercising will help you:  Control your weight.  Stay in shape.  Be prepared for labor and delivery.  Experiencing pain or cramping in the lower abdomen or low back is a good sign that you should stop exercising. Check with your health care provider before continuing normal exercises.  Try to avoid standing for long periods of time. Move your legs often if you must stand in one place for a long time.  Avoid heavy lifting.  Wear low-heeled shoes, and practice good posture.  You may continue to have sex unless your health care provider directs you  otherwise. Relief of Pain or Discomfort  Wear a good support bra for breast tenderness.   Take warm sitz baths to soothe any pain or discomfort caused by hemorrhoids. Use hemorrhoid cream if your health care provider approves.   Rest with your legs elevated if you have leg cramps or low back pain.  If you develop varicose veins in your legs, wear support hose. Elevate your feet for 15 minutes, 3-4 times a day. Limit salt in your diet. Prenatal Care  Schedule your prenatal visits by the twelfth week of pregnancy. They are usually scheduled monthly at first, then more often in the last 2 months before delivery.  Write down your questions. Take them to your prenatal visits.  Keep all your prenatal visits as directed by your health care provider. Safety  Wear your seat belt at all times when driving.  Make a list of emergency phone numbers, including numbers for family, friends, the hospital, and police and fire departments. General Tips    Ask your health care provider for a referral to a local prenatal education class. Begin classes no later than at the beginning of month 6 of your pregnancy.  Ask for help if you have counseling or nutritional needs during pregnancy. Your health care provider can offer advice or refer you to specialists for help with various needs.  Do not use hot tubs, steam rooms, or saunas.  Do not douche or use tampons or scented sanitary pads.  Do not cross your legs for long periods of time.  Avoid cat litter boxes and soil used by cats. These carry germs that can cause birth defects in the baby and possibly loss of the fetus by miscarriage or stillbirth.  Avoid all smoking, herbs, alcohol, and medicines not prescribed by your health care provider. Chemicals in these affect the formation and growth of the baby.  Schedule a dentist appointment. At home, brush your teeth with a soft toothbrush and be gentle when you floss. SEEK MEDICAL CARE IF:   You have  dizziness.  You have mild pelvic cramps, pelvic pressure, or nagging pain in the abdominal area.  You have persistent nausea, vomiting, or diarrhea.  You have a bad smelling vaginal discharge.  You have pain with urination.  You notice increased swelling in your face, hands, legs, or ankles. SEEK IMMEDIATE MEDICAL CARE IF:   You have a fever.  You are leaking fluid from your vagina.  You have spotting or bleeding from your vagina.  You have severe abdominal cramping or pain.  You have rapid weight gain or loss.  You vomit blood or material that looks like coffee grounds.  You are exposed to German measles and have never had them.  You are exposed to fifth disease or chickenpox.  You develop a severe headache.  You have shortness of breath.  You have any kind of trauma, such as from a fall or a car accident. Document Released: 11/25/2001 Document Revised: 04/17/2014 Document Reviewed: 10/11/2013 [employer] Patient Information 2015 [employer]. This information is not intended to replace advice given to you by your health care provider. Make sure you discuss any questions you have with your health care provider.  

## 2015-06-17 NOTE — MAU Provider Note (Signed)
Chief Complaint: Vaginal Bleeding   First Provider Initiated Contact with Patient 06/17/15 1910     SUBJECTIVE HPI: [name] is a 29 y.o. G1P0 at [redacted]w[redacted]d by LMP who presents to Maternity Admissions reporting spotting for the last 2 days but none today. Denies abdominal pain, clots or passage of tissue. Was seen in maternity admissions 06/14/2015 for pregnancy confirmation. No other testing this pregnancy. Last intercourse yesterday has also been using a vibrator. Concerned that this may cause her spotting.   Blood type O+.  Past Medical History  Diagnosis Date  . Bipolar disorder   . Attention deficit hyperactivity disorder   . Depression   . Anxiety   . Asthma   . UTI (urinary tract infection)     Completed Cipro 08/06/12  . Gastritis    OB History  Gravida Para Term Preterm AB SAB TAB Ectopic Multiple Living  1 0            # Outcome Date GA Lbr Len/2nd Weight Sex Delivery Anes PTL Lv  1 Current              Past Surgical History  Procedure Laterality Date  . Mouth surgery    . Eye muscle surgery Bilateral     07/29/12   History   Social History  . Marital Status: Single    Spouse Name: N/A  . Number of Children: 0  . Years of Education: N/A   Occupational History  . Not on file.   Social History Main Topics  . Smoking status: Never Smoker   . Smokeless tobacco: Never Used  . Alcohol Use: No  . Drug Use: No  . Sexual Activity: Yes    Birth Control/ Protection: None   Other Topics Concern  . Not on file   Social History Narrative   No current facility-administered medications on file prior to encounter.   Current Outpatient Prescriptions on File Prior to Encounter  Medication Sig Dispense Refill  . albuterol (PROVENTIL HFA;VENTOLIN HFA) 108 (90 BASE) MCG/ACT inhaler Inhale 2 puffs into the lungs every 6 (six) hours as needed for wheezing or shortness of breath.     . citalopram (CELEXA) 40 MG tablet Take 1 tablet (40 mg total) by mouth every  morning. For depression 30 tablet 0  . Dextromethorphan-Guaifenesin (DELSYM COUGH/CHEST CONGEST DM) 5-100 MG/5ML LIQD Take 10 mLs by mouth 2 (two) times daily as needed. 177 mL 0  . hydrOXYzine (ATARAX/VISTARIL) 25 MG tablet Take 1 tablet (25 mg total) by mouth every 6 (six) hours as needed for anxiety. 30 tablet 0  . podofilox (CONDYLOX) 0.5 % gel Apply topically 2 (two) times daily. 3.5 g 0  . promethazine (PHENERGAN) 12.5 MG tablet Take 1 tablet (12.5 mg total) by mouth 2 (two) times daily as needed for nausea or vomiting. 6 tablet 0  . traZODone (DESYREL) 150 MG tablet Take 1 tablet (150 mg total) by mouth at bedtime. 30 tablet 0   Allergies  Allergen Reactions  . Depakote [Divalproex Sodium] Other (See Comments)    Pt had h/o intolerance from Depakote. Stated that she never wants this med."Blood level was toxic"  . Methylphenidate Derivatives Other (See Comments)    Gets depressed and lots of anger  . Neurontin [Gabapentin] Other (See Comments)    Pt reports extreme dizziness  . Prozac [Fluoxetine Hcl] Other (See Comments)    Reaction: anger    Review of Systems  Constitutional: Negative for fever and chills.  Gastrointestinal:  Positive for nausea and vomiting. Negative for abdominal pain, diarrhea and constipation.  Genitourinary: Negative for dysuria, urgency, frequency, hematuria and flank pain.       Positive for spotting. Negative for vaginal discharge.    OBJECTIVE Blood pressure 122/88, pulse 119, temperature 98.2 F (36.8 C), resp. rate 18, last menstrual period 05/12/2015.    GENERAL: Well-developed, well-nourished female in no acute distress. Poor hygiene. Concern for developmental delay or learning disability. HEART: Tachycardia. RESP: normal effort GI: Abdomen soft, non-tender.  MS: Nontender, no edema NEURO: Alert and oriented  Pelvic: External: no lesion Vagina: small amount of white discharge Cervix: pink, smooth, no CMT Uterus: NSSC Adnexa: NT  LAB  RESULTS Results for orders placed or performed during the hospital encounter of 06/17/15 (from the past 24 hour(s))  Urinalysis, Routine w reflex microscopic (not at [facility])     Status: Abnormal   Collection Time: 06/17/15  6:45 PM  Result Value Ref Range   Color, Urine YELLOW YELLOW   APPearance HAZY (A) CLEAR   Specific Gravity, Urine >1.030 (H) 1.005 - 1.030   pH 5.5 5.0 - 8.0   Glucose, UA NEGATIVE NEGATIVE mg/dL   Hgb urine dipstick NEGATIVE NEGATIVE   Bilirubin Urine NEGATIVE NEGATIVE   Ketones, ur NEGATIVE NEGATIVE mg/dL   Protein, ur NEGATIVE NEGATIVE mg/dL   Urobilinogen, UA 0.2 0.0 - 1.0 mg/dL   Nitrite NEGATIVE NEGATIVE   Leukocytes, UA NEGATIVE NEGATIVE  CBC     Status: None   Collection Time: 06/17/15  7:20 PM  Result Value Ref Range   WBC 6.5 4.0 - 10.5 K/uL   RBC 4.17 3.87 - 5.11 MIL/uL   Hemoglobin 12.9 12.0 - 15.0 g/dL   HCT 39.7 36.0 - 46.0 %   MCV 95.2 78.0 - 100.0 fL   MCH 30.9 26.0 - 34.0 pg   MCHC 32.5 30.0 - 36.0 g/dL   RDW 12.9 11.5 - 15.5 %   Platelets 171 150 - 400 K/uL   US Ob Comp Less 14 Wks  06/17/2015   CLINICAL DATA:  Acute onset of vaginal spotting.  Initial encounter.  EXAM: OBSTETRIC <14 WK [place] AND TRANSVAGINAL OB US  TECHNIQUE: Both transabdominal and transvaginal ultrasound examinations were performed for complete evaluation of the gestation as well as the maternal uterus, adnexal regions, and pelvic cul-de-sac. Transvaginal technique was performed to assess early pregnancy.  COMPARISON:  None.  FINDINGS: Intrauterine gestational sac: Visualized/normal in shape.  Yolk sac:  No  Embryo:  No  Cardiac Activity: N/A  MSD: 2.5  mm   4 w   5  d  Maternal uterus/adnexae: No subchorionic hemorrhage is noted. The uterus is unremarkable in appearance.  A predominantly cystic focus is noted at the right ovary, measuring 4.9 x 3.9 x 3.8 cm, with a retracted region of mildly increased echogenicity. This appearance is most compatible with an organizing hemorrhagic  cyst. The left ovary is unremarkable in appearance. There is no evidence for ovarian torsion.  Trace free fluid is seen within the pelvic cul-de-sac.  IMPRESSION: 1. Single intrauterine gestational sac noted, with a mean sac diameter of 2.5 mm, corresponding to a gestational age of [redacted] weeks 5 days. No yolk sac or embryo seen, within normal limits. This matches the gestational age of [redacted] weeks 6 days by LMP, reflecting an estimated date of delivery of February 18, 2016. 2. 4.9 cm organizing hemorrhagic cyst noted at the right ovary. No evidence for ovarian torsion.   Electronically Signed  By: Garald Balding M.D.   On: 06/17/2015 20:19   US Ob Transvaginal  06/17/2015   CLINICAL DATA:  Acute onset of vaginal spotting.  Initial encounter.  EXAM: OBSTETRIC <14 WK [place] AND TRANSVAGINAL OB US  TECHNIQUE: Both transabdominal and transvaginal ultrasound examinations were performed for complete evaluation of the gestation as well as the maternal uterus, adnexal regions, and pelvic cul-de-sac. Transvaginal technique was performed to assess early pregnancy.  COMPARISON:  None.  FINDINGS: Intrauterine gestational sac: Visualized/normal in shape.  Yolk sac:  No  Embryo:  No  Cardiac Activity: N/A  MSD: 2.5  mm   4 w   5  d  Maternal uterus/adnexae: No subchorionic hemorrhage is noted. The uterus is unremarkable in appearance.  A predominantly cystic focus is noted at the right ovary, measuring 4.9 x 3.9 x 3.8 cm, with a retracted region of mildly increased echogenicity. This appearance is most compatible with an organizing hemorrhagic cyst. The left ovary is unremarkable in appearance. There is no evidence for ovarian torsion.  Trace free fluid is seen within the pelvic cul-de-sac.  IMPRESSION: 1. Single intrauterine gestational sac noted, with a mean sac diameter of 2.5 mm, corresponding to a gestational age of [redacted] weeks 5 days. No yolk sac or embryo seen, within normal limits. This matches the gestational age of [redacted] weeks 6 days by  LMP, reflecting an estimated date of delivery of February 18, 2016. 2. 4.9 cm organizing hemorrhagic cyst noted at the right ovary. No evidence for ovarian torsion.   Electronically Signed   By: Garald Balding M.D.   On: 06/17/2015 20:19    MAU COURSE CBC, Quant, UA, wet prep, GC/chlamydia cultures.  Marcille Buffy, CNM semicurved patient at 8:15 PM. Patient awaiting ultrasound.  Assessment/Plan:  1. Pregnancy of unknown anatomic location   2. Spotting in first trimester    DC home Comfort measures reviewed  1st trimester precautions   Bleeding precautions Ectopic precautions RX: none  Return to MAU as needed FU with OB as planned  Follow-up Information    Follow up with [facility] In 2 days.   Contact information:   [address] [id number] mc [place] [phone]      Mathis Bud 06/17/2015 8:58 PM   Manya Silvas, [place] 06/17/2015  8:23 PM

## 2015-06-17 NOTE — MAU Note (Signed)
Pt presents to MAU with complaints of vaginal spotting since intercourse last night. Denies any abdominal cramping

## 2015-06-18 LAB — HIV ANTIBODY (ROUTINE TESTING W REFLEX): HIV Screen 4th Generation wRfx: NONREACTIVE

## 2015-06-19 DIAGNOSIS — N925 Other specified irregular menstruation: Secondary | ICD-10-CM | POA: Diagnosis not present

## 2015-06-19 LAB — GC/CHLAMYDIA PROBE AMP (~~LOC~~) NOT AT ARMC
Chlamydia: NEGATIVE
Neisseria Gonorrhea: NEGATIVE

## 2015-06-25 DIAGNOSIS — Z3201 Encounter for pregnancy test, result positive: Secondary | ICD-10-CM | POA: Diagnosis not present

## 2015-06-25 DIAGNOSIS — N911 Secondary amenorrhea: Secondary | ICD-10-CM | POA: Diagnosis not present

## 2015-07-02 DIAGNOSIS — Z3A01 Less than 8 weeks gestation of pregnancy: Secondary | ICD-10-CM | POA: Diagnosis not present

## 2015-07-02 DIAGNOSIS — O200 Threatened abortion: Secondary | ICD-10-CM | POA: Diagnosis not present

## 2015-07-05 ENCOUNTER — Inpatient Hospital Stay (HOSPITAL_COMMUNITY)
Admission: AD | Admit: 2015-07-05 | Discharge: 2015-07-05 | Discharge status: Against Medical Advice | Payer: Medicare Other | Source: Ambulatory Visit | Attending: Obstetrics and Gynecology | Admitting: Obstetrics and Gynecology

## 2015-07-05 DIAGNOSIS — Z5320 Procedure and treatment not carried out because of patient's decision for unspecified reasons: Secondary | ICD-10-CM | POA: Diagnosis not present

## 2015-07-05 DIAGNOSIS — N939 Abnormal uterine and vaginal bleeding, unspecified: Secondary | ICD-10-CM | POA: Diagnosis present

## 2015-07-05 NOTE — MAU Note (Signed)
Pt stated she has been spotting off and on for 1.5 months. Got heavier today and passed some small clots. C/o. Some cramping.

## 2015-07-05 NOTE — MAU Note (Signed)
After pt was triaged she asked if this was going to take more than 30 min to wai and I told her yes it would take some time to evaluate her. She then dicided she would call her doctor and leave.

## 2015-07-06 DIAGNOSIS — Z3A01 Less than 8 weeks gestation of pregnancy: Secondary | ICD-10-CM | POA: Diagnosis not present

## 2015-07-06 DIAGNOSIS — O200 Threatened abortion: Secondary | ICD-10-CM | POA: Diagnosis not present

## 2015-07-11 DIAGNOSIS — O200 Threatened abortion: Secondary | ICD-10-CM | POA: Diagnosis not present

## 2015-07-11 DIAGNOSIS — Z3A08 8 weeks gestation of pregnancy: Secondary | ICD-10-CM | POA: Diagnosis not present

## 2015-07-16 DIAGNOSIS — Z36 Encounter for antenatal screening of mother: Secondary | ICD-10-CM | POA: Diagnosis not present

## 2015-07-16 DIAGNOSIS — F319 Bipolar disorder, unspecified: Secondary | ICD-10-CM | POA: Diagnosis not present

## 2015-07-16 DIAGNOSIS — Z3A08 8 weeks gestation of pregnancy: Secondary | ICD-10-CM | POA: Diagnosis not present

## 2015-07-16 DIAGNOSIS — Z3491 Encounter for supervision of normal pregnancy, unspecified, first trimester: Secondary | ICD-10-CM | POA: Diagnosis not present

## 2015-07-16 DIAGNOSIS — O200 Threatened abortion: Secondary | ICD-10-CM | POA: Diagnosis not present

## 2015-07-16 DIAGNOSIS — Z3401 Encounter for supervision of normal first pregnancy, first trimester: Secondary | ICD-10-CM | POA: Diagnosis not present

## 2015-07-16 LAB — OB RESULTS CONSOLE HIV ANTIBODY (ROUTINE TESTING): HIV: NONREACTIVE

## 2015-07-16 LAB — OB RESULTS CONSOLE GBS: GBS: POSITIVE

## 2015-07-16 LAB — OB RESULTS CONSOLE HEPATITIS B SURFACE ANTIGEN: Hepatitis B Surface Ag: NEGATIVE

## 2015-07-16 LAB — OB RESULTS CONSOLE ABO/RH: RH Type: POSITIVE

## 2015-07-16 LAB — OB RESULTS CONSOLE RPR: RPR: NONREACTIVE

## 2015-07-16 LAB — OB RESULTS CONSOLE ANTIBODY SCREEN: Antibody Screen: NEGATIVE

## 2015-07-16 LAB — OB RESULTS CONSOLE RUBELLA ANTIBODY, IGM: Rubella: IMMUNE

## 2015-07-16 LAB — OB RESULTS CONSOLE GC/CHLAMYDIA
Chlamydia: NEGATIVE
Gonorrhea: NEGATIVE

## 2015-07-17 DIAGNOSIS — Z1272 Encounter for screening for malignant neoplasm of vagina: Secondary | ICD-10-CM | POA: Diagnosis not present

## 2015-07-17 DIAGNOSIS — Z124 Encounter for screening for malignant neoplasm of cervix: Secondary | ICD-10-CM | POA: Diagnosis not present

## 2015-07-21 DIAGNOSIS — J45909 Unspecified asthma, uncomplicated: Secondary | ICD-10-CM | POA: Diagnosis not present

## 2015-07-21 DIAGNOSIS — F319 Bipolar disorder, unspecified: Secondary | ICD-10-CM | POA: Diagnosis not present

## 2015-07-21 DIAGNOSIS — S60512A Abrasion of left hand, initial encounter: Secondary | ICD-10-CM | POA: Diagnosis not present

## 2015-07-21 DIAGNOSIS — R000 Tachycardia, unspecified: Secondary | ICD-10-CM | POA: Diagnosis not present

## 2015-07-21 DIAGNOSIS — S30810A Abrasion of lower back and pelvis, initial encounter: Secondary | ICD-10-CM | POA: Diagnosis not present

## 2015-07-23 DIAGNOSIS — Z3A09 9 weeks gestation of pregnancy: Secondary | ICD-10-CM | POA: Diagnosis not present

## 2015-07-23 DIAGNOSIS — O3680X0 Pregnancy with inconclusive fetal viability, not applicable or unspecified: Secondary | ICD-10-CM | POA: Diagnosis not present

## 2015-07-23 DIAGNOSIS — Z1379 Encounter for other screening for genetic and chromosomal anomalies: Secondary | ICD-10-CM | POA: Diagnosis not present

## 2015-07-28 ENCOUNTER — Encounter (HOSPITAL_COMMUNITY): Payer: Self-pay | Admitting: Emergency Medicine

## 2015-07-28 ENCOUNTER — Emergency Department (HOSPITAL_COMMUNITY)
Admission: EM | Admit: 2015-07-28 | Discharge: 2015-07-28 | Discharge status: Against Medical Advice | Payer: Medicare Other | Attending: Emergency Medicine | Admitting: Emergency Medicine

## 2015-07-28 ENCOUNTER — Inpatient Hospital Stay (HOSPITAL_COMMUNITY)
Admission: AD | Admit: 2015-07-28 | Discharge: 2015-07-28 | Disposition: A | Discharge status: Home / Self Care | Payer: Medicare Other | Source: Ambulatory Visit | Attending: Obstetrics and Gynecology | Admitting: Obstetrics and Gynecology

## 2015-07-28 ENCOUNTER — Encounter (HOSPITAL_COMMUNITY): Payer: Self-pay | Admitting: *Deleted

## 2015-07-28 DIAGNOSIS — O26851 Spotting complicating pregnancy, first trimester: Secondary | ICD-10-CM

## 2015-07-28 DIAGNOSIS — R5383 Other fatigue: Secondary | ICD-10-CM | POA: Insufficient documentation

## 2015-07-28 DIAGNOSIS — O26899 Other specified pregnancy related conditions, unspecified trimester: Secondary | ICD-10-CM

## 2015-07-28 DIAGNOSIS — R109 Unspecified abdominal pain: Secondary | ICD-10-CM | POA: Diagnosis not present

## 2015-07-28 DIAGNOSIS — Z3A10 10 weeks gestation of pregnancy: Secondary | ICD-10-CM | POA: Insufficient documentation

## 2015-07-28 DIAGNOSIS — J45909 Unspecified asthma, uncomplicated: Secondary | ICD-10-CM | POA: Insufficient documentation

## 2015-07-28 DIAGNOSIS — Z3A09 9 weeks gestation of pregnancy: Secondary | ICD-10-CM | POA: Insufficient documentation

## 2015-07-28 DIAGNOSIS — O99511 Diseases of the respiratory system complicating pregnancy, first trimester: Secondary | ICD-10-CM | POA: Insufficient documentation

## 2015-07-28 DIAGNOSIS — R531 Weakness: Secondary | ICD-10-CM | POA: Diagnosis not present

## 2015-07-28 DIAGNOSIS — R42 Dizziness and giddiness: Secondary | ICD-10-CM | POA: Diagnosis not present

## 2015-07-28 DIAGNOSIS — O209 Hemorrhage in early pregnancy, unspecified: Secondary | ICD-10-CM | POA: Insufficient documentation

## 2015-07-28 DIAGNOSIS — O9989 Other specified diseases and conditions complicating pregnancy, childbirth and the puerperium: Secondary | ICD-10-CM | POA: Insufficient documentation

## 2015-07-28 DIAGNOSIS — R404 Transient alteration of awareness: Secondary | ICD-10-CM | POA: Diagnosis not present

## 2015-07-28 LAB — URINALYSIS, ROUTINE W REFLEX MICROSCOPIC
Glucose, UA: NEGATIVE mg/dL
Hgb urine dipstick: NEGATIVE
Ketones, ur: 15 mg/dL — AB
Leukocytes, UA: NEGATIVE
Nitrite: NEGATIVE
Protein, ur: NEGATIVE mg/dL
Specific Gravity, Urine: >1.03 — ABNORMAL HIGH (ref 1.005–1.030)
Urobilinogen, UA: 1 mg/dL (ref 0.0–1.0)
pH: 6 (ref 5.0–8.0)

## 2015-07-28 NOTE — MAU Provider Note (Signed)
History     CSN: [id number]  Arrival date and time: 07/28/15 1404   First Provider Initiated Contact with Patient 07/28/15 1439      Chief Complaint  Patient presents with  . Vaginal Bleeding  . Fatigue   HPI [name] is a 29 y.o. G1P0 at [redacted]w[redacted]d who presents to MAU today with complaint of vaginal bleeding and lower abdominal cramping. The patient states that bleeding started today. She also had some bleeding a few weeks ago. She reports a normal [place] in the office last week and EDC of 02/24/15. She states bleeding has been only a small amount. She has not required a pad. She rates her pain at 5/10 now and has not taken anything for pain. She continues to have some N/V consistent with morning sickness that has been present throughout the pregnancy. She denies diarrhea or constipation or UTI symptoms.   OB History    Gravida Para Term Preterm AB TAB SAB Ectopic Multiple Living   1 0              Past Medical History  Diagnosis Date  . Bipolar disorder   . Attention deficit hyperactivity disorder   . Depression   . Anxiety   . Asthma   . UTI (urinary tract infection)     Completed Cipro 08/06/12  . Gastritis     Past Surgical History  Procedure Laterality Date  . Mouth surgery    . Eye muscle surgery Bilateral     07/29/12    Family History  Problem Relation Age of Onset  . Depression Mother   . Depression Father   . Depression Brother   . Colon cancer Mother   . Colon polyps Mother   . Liver disease    . Kidney disease      Social History  Substance Use Topics  . Smoking status: Never Smoker   . Smokeless tobacco: Never Used  . Alcohol Use: No    Allergies:  Allergies  Allergen Reactions  . Depakote [Divalproex Sodium] Other (See Comments)    Pt stated that her blood level was toxic when she had this medication.    . Methylphenidate Derivatives Other (See Comments)    Reaction:  Depression and anger   . Neurontin [Gabapentin] Other (See  Comments)    Reaction:  Dizziness   . Prozac [Fluoxetine Hcl] Other (See Comments)    Reaction:  Anger     Prescriptions prior to admission  Medication Sig Dispense Refill Last Dose  . citalopram (CELEXA) 40 MG tablet Take 1 tablet (40 mg total) by mouth every morning. For depression (Patient taking differently: Take 40 mg by mouth daily. ) 30 tablet 0 07/28/2015 at Unknown time  . Prenatal Vit-Fe Fumarate-FA (PRENATAL MULTIVITAMIN) TABS tablet Take 1 tablet by mouth daily at 12 noon.   07/27/2015 at Unknown time  . traZODone (DESYREL) 150 MG tablet Take 1 tablet (150 mg total) by mouth at bedtime. (Patient taking differently: Take 150 mg by mouth at bedtime as needed for sleep. ) 30 tablet 0 07/27/2015 at Unknown time  . hydrOXYzine (ATARAX/VISTARIL) 25 MG tablet Take 1 tablet (25 mg total) by mouth every 6 (six) hours as needed for anxiety. (Patient not taking: Reported on 07/28/2015) 30 tablet 0 06/17/2015 at Unknown time  . promethazine (PHENERGAN) 12.5 MG tablet Take 1 tablet (12.5 mg total) by mouth 2 (two) times daily as needed for nausea or vomiting. (Patient not taking: Reported on 06/17/2015)  6 tablet 0     Review of Systems  Constitutional: Negative for fever and malaise/fatigue.  Gastrointestinal: Positive for nausea, vomiting and abdominal pain. Negative for diarrhea and constipation.  Genitourinary: Negative for dysuria, urgency and frequency.       Neg - vaginal discharge + vaginal bleeding   Physical Exam   Blood pressure 130/60, pulse 70, temperature 98.2 F (36.8 C), temperature source Oral, resp. rate 18, last menstrual period 05/12/2015, SpO2 99 %.  Physical Exam  Nursing note and vitals reviewed. Constitutional: She is oriented to person, place, and time. She appears well-developed and well-nourished. No distress.  HENT:  Head: Normocephalic and atraumatic.  Cardiovascular: Normal rate.   Respiratory: Effort normal.  GI: Soft. She exhibits no distension and no mass.  There is no tenderness. There is no rebound and no guarding.  Genitourinary: Uterus is enlarged (slightly). Uterus is not tender. Cervix exhibits no motion tenderness, no discharge and no friability. No bleeding in the vagina. No vaginal discharge found.  Neurological: She is alert and oriented to person, place, and time.  Skin: Skin is warm and dry. No erythema.  Psychiatric: She has a normal mood and affect.   Results for orders placed or performed during the hospital encounter of 07/28/15 (from the past 24 hour(s))  Urinalysis, Routine w reflex microscopic (not at [facility])     Status: Abnormal   Collection Time: 07/28/15  2:00 PM  Result Value Ref Range   Color, Urine YELLOW YELLOW   APPearance CLEAR CLEAR   Specific Gravity, Urine >1.030 (H) 1.005 - 1.030   pH 6.0 5.0 - 8.0   Glucose, UA NEGATIVE NEGATIVE mg/dL   Hgb urine dipstick NEGATIVE NEGATIVE   Bilirubin Urine SMALL (A) NEGATIVE   Ketones, ur 15 (A) NEGATIVE mg/dL   Protein, ur NEGATIVE NEGATIVE mg/dL   Urobilinogen, UA 1.0 0.0 - 1.0 mg/dL   Nitrite NEGATIVE NEGATIVE   Leukocytes, UA NEGATIVE NEGATIVE    MAU Course  Procedures None  MDM Patient also requesting to find out results from recent office testing, advised to call the office or access online portal for results Patient immediately requested to order food while in MAU Discussed with Dr. Willis Modena. Agrees with plan for discharge at this time. Patient to follow-up in the office as scheduled for routine prenatal care.   Assessment and Plan  A: SIUP at [redacted]w[redacted]d Abdominal pain in pregnancy Vaginal bleeding in pregnancy  P: Discharge home Bleeding precautions discussed PO hydration advised Patient advised to follow-up with [facility] as scheduled for routine prenatal care Patient may return to MAU as needed or if her condition were to change or worsen  Luvenia Redden, PA-C  07/28/2015, 3:45 PM

## 2015-07-28 NOTE — MAU Note (Signed)
Pt presents to MAU with complaints of vaginal bleeding that started this morning. Intercourse 2 days ago. Reports lower abdominal cramping

## 2015-07-28 NOTE — ED Notes (Signed)
Patient reports weakness, [redacted] weeks pregnant. Patient was just discharged from [facility].

## 2015-07-28 NOTE — Discharge Instructions (Signed)
First Trimester of Pregnancy The first trimester of pregnancy is from week 1 until the end of week 12 (months 1 through 3). During this time, your baby will begin to develop inside you. At 6-8 weeks, the eyes and face are formed, and the heartbeat can be seen on ultrasound. At the end of 12 weeks, all the baby's organs are formed. Prenatal care is all the medical care you receive before the birth of your baby. Make sure you get good prenatal care and follow all of your doctor's instructions. HOME CARE  Medicines  Take medicine only as told by your doctor. Some medicines are safe and some are not during pregnancy.  Take your prenatal vitamins as told by your doctor.  Take medicine that helps you poop (stool softener) as needed if your doctor says it is okay. Diet  Eat regular, healthy meals.  Your doctor will tell you the amount of weight gain that is right for you.  Avoid raw meat and uncooked cheese.  If you feel sick to your stomach (nauseous) or throw up (vomit):  Eat 4 or 5 small meals a day instead of 3 large meals.  Try eating a few soda crackers.  Drink liquids between meals instead of during meals.  If you have a hard time pooping (constipation):  Eat high-fiber foods like fresh vegetables, fruit, and whole grains.  Drink enough fluids to keep your pee (urine) clear or pale yellow. Activity and Exercise  Exercise only as told by your doctor. Stop exercising if you have cramps or pain in your lower belly (abdomen) or low back.  Try to avoid standing for long periods of time. Move your legs often if you must stand in one place for a long time.  Avoid heavy lifting.  Wear low-heeled shoes. Sit and stand up straight.  You can have sex unless your doctor tells you not to. Relief of Pain or Discomfort  Wear a good support bra if your breasts are sore.  Take warm water baths (sitz baths) to soothe pain or discomfort caused by hemorrhoids. Use hemorrhoid cream if your  doctor says it is okay.  Rest with your legs raised if you have leg cramps or low back pain.  Wear support hose if you have puffy, bulging veins (varicose veins) in your legs. Raise (elevate) your feet for 15 minutes, 3-4 times a day. Limit salt in your diet. Prenatal Care  Schedule your prenatal visits by the twelfth week of pregnancy.  Write down your questions. Take them to your prenatal visits.  Keep all your prenatal visits as told by your doctor. Safety  Wear your seat belt at all times when driving.  Make a list of emergency phone numbers. The list should include numbers for family, friends, the hospital, and police and fire departments. General Tips  Ask your doctor for a referral to a local prenatal class. Begin classes no later than at the start of month 6 of your pregnancy.  Ask for help if you need counseling or help with nutrition. Your doctor can give you advice or tell you where to go for help.  Do not use hot tubs, steam rooms, or saunas.  Do not douche or use tampons or scented sanitary pads.  Do not cross your legs for long periods of time.  Avoid litter boxes and soil used by cats.  Avoid all smoking, herbs, and alcohol. Avoid drugs not approved by your doctor.  Visit your dentist. At home, brush your teeth   with a soft toothbrush. Be gentle when you floss. GET HELP IF:  You are dizzy.  You have mild cramps or pressure in your lower belly.  You have a nagging pain in your belly area.  You continue to feel sick to your stomach, throw up, or have watery poop (diarrhea).  You have a bad smelling fluid coming from your vagina.  You have pain with peeing (urination).  You have increased puffiness (swelling) in your face, hands, legs, or ankles. GET HELP RIGHT AWAY IF:   You have a fever.  You are leaking fluid from your vagina.  You have spotting or bleeding from your vagina.  You have very bad belly cramping or pain.  You gain or lose weight  rapidly.  You throw up blood. It may look like coffee grounds.  You are around people who have German measles, fifth disease, or chickenpox.  You have a very bad headache.  You have shortness of breath.  You have any kind of trauma, such as from a fall or a car accident. Document Released: 05/19/2008 Document Revised: 04/17/2014 Document Reviewed: 10/11/2013 ExitCare Patient Information 2015 [employer]. This information is not intended to replace advice given to you by your health care provider. Make sure you discuss any questions you have with your health care provider.  

## 2015-08-02 ENCOUNTER — Inpatient Hospital Stay (HOSPITAL_COMMUNITY)
Admission: AD | Admit: 2015-08-02 | Discharge: 2015-08-02 | Disposition: A | Discharge status: Home / Self Care | Payer: Medicare Other | Source: Ambulatory Visit | Attending: Obstetrics and Gynecology | Admitting: Obstetrics and Gynecology

## 2015-08-02 ENCOUNTER — Encounter (HOSPITAL_COMMUNITY): Payer: Self-pay

## 2015-08-02 DIAGNOSIS — R109 Unspecified abdominal pain: Secondary | ICD-10-CM

## 2015-08-02 DIAGNOSIS — O9989 Other specified diseases and conditions complicating pregnancy, childbirth and the puerperium: Secondary | ICD-10-CM

## 2015-08-02 DIAGNOSIS — Z3A10 10 weeks gestation of pregnancy: Secondary | ICD-10-CM | POA: Insufficient documentation

## 2015-08-02 DIAGNOSIS — O26899 Other specified pregnancy related conditions, unspecified trimester: Secondary | ICD-10-CM

## 2015-08-02 DIAGNOSIS — R1030 Lower abdominal pain, unspecified: Secondary | ICD-10-CM | POA: Diagnosis not present

## 2015-08-02 NOTE — MAU Provider Note (Signed)
History     CSN: [id number]  Arrival date and time: 08/02/15 1520   First Provider Initiated Contact with Patient 08/02/15 1547      Chief Complaint  Patient presents with  . Near Syncope   HPI [name] 29 y.o. G1P0 @ [redacted]w[redacted]d presents to the MAU with c/o of abdominal pain that started approximately 1 hour ago. She denies vaginal bleeding. Denies having nausea.   Past Medical History  Diagnosis Date  . Bipolar disorder   . Attention deficit hyperactivity disorder   . Depression   . Anxiety   . Asthma   . UTI (urinary tract infection)     Completed Cipro 08/06/12  . Gastritis     Past Surgical History  Procedure Laterality Date  . Mouth surgery    . Eye muscle surgery Bilateral     07/29/12    Family History  Problem Relation Age of Onset  . Depression Mother   . Depression Father   . Depression Brother   . Colon cancer Mother   . Colon polyps Mother   . Liver disease    . Kidney disease      Social History  Substance Use Topics  . Smoking status: Never Smoker   . Smokeless tobacco: Never Used  . Alcohol Use: No    Allergies:  Allergies  Allergen Reactions  . Depakote [Divalproex Sodium] Other (See Comments)    Pt stated that her blood level was toxic when she had this medication.    . Methylphenidate Derivatives Other (See Comments)    Reaction:  Depression and anger   . Neurontin [Gabapentin] Other (See Comments)    Reaction:  Dizziness   . Prozac [Fluoxetine Hcl] Other (See Comments)    Reaction:  Anger     Prescriptions prior to admission  Medication Sig Dispense Refill Last Dose  . citalopram (CELEXA) 40 MG tablet Take 1 tablet (40 mg total) by mouth every morning. For depression (Patient taking differently: Take 40 mg by mouth daily. ) 30 tablet 0 07/28/2015 at Unknown time  . Prenatal Vit-Fe Fumarate-FA (PRENATAL MULTIVITAMIN) TABS tablet Take 1 tablet by mouth daily at 12 noon.   07/27/2015 at Unknown time  . traZODone (DESYREL) 150 MG  tablet Take 1 tablet (150 mg total) by mouth at bedtime. (Patient taking differently: Take 150 mg by mouth at bedtime as needed for sleep. ) 30 tablet 0 07/27/2015 at Unknown time    Review of Systems  Constitutional: Negative for fever.  Gastrointestinal: Positive for abdominal pain.  All other systems reviewed and are negative.  Physical Exam   Blood pressure 128/72, pulse 86, temperature 98.2 F (36.8 C), temperature source Oral, resp. rate 16, last menstrual period 05/12/2015.  Physical Exam  Nursing note and vitals reviewed. Constitutional: She is oriented to person, place, and time. She appears well-developed and well-nourished. No distress.  HENT:  Head: Normocephalic and atraumatic.  Neck: Normal range of motion.  Cardiovascular: Normal rate and regular rhythm.   Respiratory: Effort normal. No respiratory distress.  GI: Soft. There is no tenderness.  Musculoskeletal: Normal range of motion. She exhibits no edema.  Neurological: She is alert and oriented to person, place, and time.  Skin: Skin is warm and dry.  Psychiatric: She has a normal mood and affect. Her behavior is normal. Judgment and thought content normal.   Cervix- closed/th/high; no bleeding on exam MAU Course  Procedures  MDM Spoke to Dr Willis Modena re POC- Feed pt; Vag exam  closed; no bleeding noted on glove; and pt will be discharged to home. Pt does not have transportation; Social Services consult ordered.  Assessment and Plan  Abdominal Pain in pregnancy Social Work consult Discharge to home  [facility] 08/02/2015, 4:01 PM

## 2015-08-02 NOTE — Progress Notes (Signed)
CSW authorized taxi voucher for pt's transportation home.

## 2015-08-02 NOTE — Discharge Instructions (Signed)

## 2015-08-02 NOTE — MAU Note (Signed)
Pt presents via EMS stating she felt like she was going to pass out. Pt states she is having 10/10 period cramps. Pt states she is having bleeding like a period and vaginal discharge.

## 2015-08-13 DIAGNOSIS — O3680X0 Pregnancy with inconclusive fetal viability, not applicable or unspecified: Secondary | ICD-10-CM | POA: Diagnosis not present

## 2015-08-13 DIAGNOSIS — Z3A12 12 weeks gestation of pregnancy: Secondary | ICD-10-CM | POA: Diagnosis not present

## 2015-08-13 DIAGNOSIS — Z36 Encounter for antenatal screening of mother: Secondary | ICD-10-CM | POA: Diagnosis not present

## 2015-08-23 ENCOUNTER — Encounter (HOSPITAL_COMMUNITY): Payer: Self-pay | Admitting: Emergency Medicine

## 2015-08-23 ENCOUNTER — Emergency Department (HOSPITAL_COMMUNITY)
Admission: EM | Admit: 2015-08-23 | Discharge: 2015-08-23 | Discharge status: Against Medical Advice | Payer: Medicare Other | Attending: Emergency Medicine | Admitting: Emergency Medicine

## 2015-08-23 DIAGNOSIS — J45909 Unspecified asthma, uncomplicated: Secondary | ICD-10-CM | POA: Diagnosis not present

## 2015-08-23 DIAGNOSIS — R079 Chest pain, unspecified: Secondary | ICD-10-CM | POA: Insufficient documentation

## 2015-08-23 DIAGNOSIS — R109 Unspecified abdominal pain: Secondary | ICD-10-CM | POA: Insufficient documentation

## 2015-08-23 DIAGNOSIS — O9989 Other specified diseases and conditions complicating pregnancy, childbirth and the puerperium: Secondary | ICD-10-CM | POA: Diagnosis present

## 2015-08-23 DIAGNOSIS — Z3A16 16 weeks gestation of pregnancy: Secondary | ICD-10-CM | POA: Insufficient documentation

## 2015-08-23 DIAGNOSIS — O99512 Diseases of the respiratory system complicating pregnancy, second trimester: Secondary | ICD-10-CM | POA: Insufficient documentation

## 2015-08-23 DIAGNOSIS — O210 Mild hyperemesis gravidarum: Secondary | ICD-10-CM | POA: Diagnosis not present

## 2015-08-23 DIAGNOSIS — K2970 Gastritis, unspecified, without bleeding: Secondary | ICD-10-CM | POA: Diagnosis not present

## 2015-08-23 DIAGNOSIS — R112 Nausea with vomiting, unspecified: Secondary | ICD-10-CM | POA: Diagnosis not present

## 2015-08-23 NOTE — ED Notes (Addendum)
Per EMS. Pt reports CP and stomach pain for the past hour. Pt has been having n/v for the past week. Pt is 4 months pregnant. Pt states the last time she threw up was yesterday.

## 2015-08-23 NOTE — ED Notes (Signed)
Bed: NS25 Expected date:  Expected time:  Means of arrival:  Comments: EMS- 29yo F, acid reflux/4 months pregnant

## 2015-08-23 NOTE — ED Notes (Signed)
Per EMS, Pt adamantly refused to be transported to [facility].  It is noted that the Pt's mother is a Pt upstairs.  Pt's boyfriend and Pt were transported earlier w/ the boyfriend stating a medical complaint.  Upon EMS transport/arrival, he refused to be seen and they left.

## 2015-09-06 ENCOUNTER — Encounter (HOSPITAL_COMMUNITY): Payer: Self-pay

## 2015-09-06 ENCOUNTER — Emergency Department (HOSPITAL_COMMUNITY)
Admission: EM | Admit: 2015-09-06 | Discharge: 2015-09-07 | Disposition: A | Discharge status: Other Acute Inpatient Hospital | Payer: Medicare Other | Attending: Physician Assistant | Admitting: Physician Assistant

## 2015-09-06 DIAGNOSIS — T50902A Poisoning by unspecified drugs, medicaments and biological substances, intentional self-harm, initial encounter: Secondary | ICD-10-CM | POA: Diagnosis not present

## 2015-09-06 DIAGNOSIS — F319 Bipolar disorder, unspecified: Secondary | ICD-10-CM | POA: Diagnosis not present

## 2015-09-06 DIAGNOSIS — T450X1A Poisoning by antiallergic and antiemetic drugs, accidental (unintentional), initial encounter: Secondary | ICD-10-CM | POA: Diagnosis not present

## 2015-09-06 DIAGNOSIS — O9A212 Injury, poisoning and certain other consequences of external causes complicating pregnancy, second trimester: Secondary | ICD-10-CM | POA: Diagnosis not present

## 2015-09-06 DIAGNOSIS — T450X2A Poisoning by antiallergic and antiemetic drugs, intentional self-harm, initial encounter: Secondary | ICD-10-CM | POA: Diagnosis not present

## 2015-09-06 DIAGNOSIS — T43592A Poisoning by other antipsychotics and neuroleptics, intentional self-harm, initial encounter: Secondary | ICD-10-CM | POA: Diagnosis not present

## 2015-09-06 DIAGNOSIS — Z818 Family history of other mental and behavioral disorders: Secondary | ICD-10-CM | POA: Diagnosis not present

## 2015-09-06 DIAGNOSIS — F419 Anxiety disorder, unspecified: Secondary | ICD-10-CM | POA: Diagnosis not present

## 2015-09-06 DIAGNOSIS — J45909 Unspecified asthma, uncomplicated: Secondary | ICD-10-CM | POA: Diagnosis not present

## 2015-09-06 DIAGNOSIS — F909 Attention-deficit hyperactivity disorder, unspecified type: Secondary | ICD-10-CM | POA: Diagnosis not present

## 2015-09-06 DIAGNOSIS — Z3A15 15 weeks gestation of pregnancy: Secondary | ICD-10-CM | POA: Diagnosis not present

## 2015-09-06 DIAGNOSIS — Z800 Family history of malignant neoplasm of digestive organs: Secondary | ICD-10-CM | POA: Diagnosis not present

## 2015-09-06 LAB — URINE MICROSCOPIC-ADD ON

## 2015-09-06 LAB — RAPID URINE DRUG SCREEN, HOSP PERFORMED
Amphetamines: NOT DETECTED
Barbiturates: NOT DETECTED
Benzodiazepines: NOT DETECTED
Cocaine: NOT DETECTED
Opiates: NOT DETECTED
Tetrahydrocannabinol: NOT DETECTED

## 2015-09-06 LAB — COMPREHENSIVE METABOLIC PANEL
ALT: 27 U/L (ref 14–54)
AST: 22 U/L (ref 15–41)
Albumin: 3 g/dL — ABNORMAL LOW (ref 3.5–5.0)
Alkaline Phosphatase: 53 U/L (ref 38–126)
Anion gap: 9 (ref 5–15)
BUN: <5 mg/dL — ABNORMAL LOW (ref 6–20)
CO2: 22 mmol/L (ref 22–32)
Calcium: 9.2 mg/dL (ref 8.9–10.3)
Chloride: 107 mmol/L (ref 101–111)
Creatinine, Ser: 0.62 mg/dL (ref 0.44–1.00)
GFR calc Af Amer: >60 mL/min (ref >60)
GFR calc non Af Amer: >60 mL/min (ref >60)
Glucose, Bld: 83 mg/dL (ref 65–99)
Potassium: 4.1 mmol/L (ref 3.5–5.1)
Sodium: 138 mmol/L (ref 135–145)
Total Bilirubin: 0.5 mg/dL (ref 0.3–1.2)
Total Protein: 6.2 g/dL — ABNORMAL LOW (ref 6.5–8.1)

## 2015-09-06 LAB — ACETAMINOPHEN LEVEL: Acetaminophen (Tylenol), Serum: <10 ug/mL — ABNORMAL LOW (ref 10–30)

## 2015-09-06 LAB — CBC WITH DIFFERENTIAL/PLATELET
Basophils Absolute: 0 10*3/uL (ref 0.0–0.1)
Basophils Relative: 0 %
Eosinophils Absolute: 0 10*3/uL (ref 0.0–0.7)
Eosinophils Relative: 0 %
HCT: 38 % (ref 36.0–46.0)
Hemoglobin: 12.8 g/dL (ref 12.0–15.0)
Lymphocytes Relative: 26 %
Lymphs Abs: 1.4 10*3/uL (ref 0.7–4.0)
MCH: 31 pg (ref 26.0–34.0)
MCHC: 33.7 g/dL (ref 30.0–36.0)
MCV: 92 fL (ref 78.0–100.0)
Monocytes Absolute: 0.4 10*3/uL (ref 0.1–1.0)
Monocytes Relative: 7 %
Neutro Abs: 3.7 10*3/uL (ref 1.7–7.7)
Neutrophils Relative %: 67 %
Platelets: 131 10*3/uL — ABNORMAL LOW (ref 150–400)
RBC: 4.13 MIL/uL (ref 3.87–5.11)
RDW: 12.9 % (ref 11.5–15.5)
WBC: 5.5 10*3/uL (ref 4.0–10.5)

## 2015-09-06 LAB — URINALYSIS, ROUTINE W REFLEX MICROSCOPIC
Glucose, UA: NEGATIVE mg/dL
Hgb urine dipstick: NEGATIVE
Ketones, ur: 40 mg/dL — AB
Nitrite: NEGATIVE
Protein, ur: NEGATIVE mg/dL
Specific Gravity, Urine: 1.023 (ref 1.005–1.030)
Urobilinogen, UA: 4 mg/dL — ABNORMAL HIGH (ref 0.0–1.0)
pH: 7 (ref 5.0–8.0)

## 2015-09-06 LAB — ETHANOL: Alcohol, Ethyl (B): <5 mg/dL (ref <5)

## 2015-09-06 MED ORDER — ACETAMINOPHEN 325 MG PO TABS: 650.0000 mg | ORAL_TABLET | Freq: Four times a day (QID) | ORAL | Status: DC | PRN

## 2015-09-06 MED ORDER — ACETAMINOPHEN 325 MG PO TABS: 650.0000 mg | ORAL_TABLET | ORAL | Status: DC | PRN

## 2015-09-06 MED ORDER — IBUPROFEN 400 MG PO TABS: 600.0000 mg | ORAL_TABLET | Freq: Three times a day (TID) | ORAL | Status: DC | PRN

## 2015-09-06 NOTE — ED Notes (Addendum)
Per EMS, Patient called out for OD. About 2100,  Patient took "about 50 Hydroxyzine" due to depression and thoughts of harming oneself. Patient denies any thoughts of harming herself at this time. Patient called today because of her pregnancy. Pt is 15 wks 3 days pregnant, and she wanted to make sure the baby was okay. Vitals per EMS: 134/76, 76 HR, 20 RR, 90 % on RA.

## 2015-09-06 NOTE — ED Notes (Signed)
Spoke with [facility] about patient's care. Patient meets inpatient criteria.

## 2015-09-06 NOTE — ED Notes (Signed)
Security standing by. Pt changed - gave cell phone and charger to fiance. Clothing placed in belongings bag by pt - pt states she wants them to stay in ED.

## 2015-09-06 NOTE — ED Provider Notes (Signed)
CSN: [id number]     Arrival date & time 09/06/15  1113 History   First MD Initiated Contact with Patient 09/06/15 1117     Chief Complaint  Patient presents with  . Drug Overdose     (Consider location/radiation/quality/duration/timing/severity/associated sxs/prior Treatment) Patient is a 29 y.o. female presenting with mental health disorder.  Mental Health Problem Presenting symptoms: self mutilation   Presenting symptoms: no suicidal thoughts   Patient accompanied by:  Spouse Degree of incapacity (severity):  Severe Onset quality:  Gradual Duration:  4 months Timing:  Constant Progression:  Worsening Chronicity:  New Context: recent medication change   Treatment compliance:  All of the time Relieved by:  None tried Worsened by:  Family interactions Ineffective treatments:  None tried Associated symptoms: poor judgment   Associated symptoms: no abdominal pain, no chest pain and no headaches   Risk factors: hx of mental illness and hx of suicide attempts     Past Medical History  Diagnosis Date  . Bipolar disorder   . Attention deficit hyperactivity disorder   . Depression   . Anxiety   . Asthma   . UTI (urinary tract infection)     Completed Cipro 08/06/12  . Gastritis    Past Surgical History  Procedure Laterality Date  . Mouth surgery    . Eye muscle surgery Bilateral     07/29/12  . Plantar fascia surgery     Family History  Problem Relation Age of Onset  . Depression Mother   . Depression Father   . Depression Brother   . Colon cancer Mother   . Colon polyps Mother   . Liver disease    . Kidney disease     Social History  Substance Use Topics  . Smoking status: Never Smoker   . Smokeless tobacco: Never Used  . Alcohol Use: No   OB History    Gravida Para Term Preterm AB TAB SAB Ectopic Multiple Living   1 0             Review of Systems  Constitutional: Negative for fever and chills.  HENT: Negative for congestion and sore throat.   Eyes:  Negative for visual disturbance.  Respiratory: Negative for cough, shortness of breath and wheezing.   Cardiovascular: Negative for chest pain.  Gastrointestinal: Negative for nausea, vomiting, abdominal pain, diarrhea and constipation.  Genitourinary: Negative for dysuria, difficulty urinating and vaginal pain.  Musculoskeletal: Negative for myalgias and arthralgias.  Skin: Negative for rash.  Neurological: Negative for syncope and headaches.  Psychiatric/Behavioral: Positive for self-injury. Negative for suicidal ideas and behavioral problems.  All other systems reviewed and are negative.     Allergies  Depakote; Methylphenidate derivatives; Neurontin; and Prozac  Home Medications   Prior to Admission medications   Medication Sig Start Date End Date Taking? Authorizing Provider  albuterol (PROVENTIL HFA;VENTOLIN HFA) 108 (90 BASE) MCG/ACT inhaler Inhale 2 puffs into the lungs every 6 (six) hours as needed for wheezing or shortness of breath.   Yes Historical Provider, MD  citalopram (CELEXA) 40 MG tablet Take 1 tablet (40 mg total) by mouth every morning. For depression Patient taking differently: Take 40 mg by mouth daily.  03/15/15  Yes Shuvon B Rankin, NP  hydrOXYzine (VISTARIL) 25 MG capsule Take 25 mg by mouth 3 (three) times daily as needed.   Yes Historical Provider, MD  Prenatal Vit-Fe Fumarate-FA (PRENATAL MULTIVITAMIN) TABS tablet Take 2 tablets by mouth daily at 12 noon.    Yes  Historical Provider, MD  traZODone (DESYREL) 150 MG tablet Take 1 tablet (150 mg total) by mouth at bedtime. 03/15/15  Yes Shuvon B Rankin, NP   BP 116/66 mmHg  Pulse 83  Temp(Src) 98.6 F (37 C) (Oral)  Resp 16  SpO2 98%  LMP 05/12/2015 (Approximate) Physical Exam  Constitutional: She is oriented to person, place, and time. She appears well-developed and well-nourished. No distress.  HENT:  Head: Normocephalic and atraumatic.  Eyes: EOM are normal.  Neck: Normal range of motion.   Cardiovascular: Normal rate, regular rhythm and normal heart sounds.   No murmur heard. Pulmonary/Chest: Effort normal and breath sounds normal. No respiratory distress. She has no wheezes.  Abdominal: Soft. There is no tenderness.  Musculoskeletal: She exhibits no edema.  Neurological: She is alert and oriented to person, place, and time.  Skin: She is not diaphoretic.  Psychiatric: Her speech is normal and behavior is normal. Her affect is inappropriate. She expresses no homicidal and no suicidal ideation. She expresses no suicidal plans and no homicidal plans.    ED Course  Procedures (including critical care time) Labs Review Labs Reviewed  CBC WITH DIFFERENTIAL/PLATELET - Abnormal; Notable for the following:    Platelets 131 (*)    All other components within normal limits  COMPREHENSIVE METABOLIC PANEL - Abnormal; Notable for the following:    BUN <5 (*)    Total Protein 6.2 (*)    Albumin 3.0 (*)    All other components within normal limits  ACETAMINOPHEN LEVEL - Abnormal; Notable for the following:    Acetaminophen (Tylenol), Serum <10 (*)    All other components within normal limits  ETHANOL  URINALYSIS, ROUTINE W REFLEX MICROSCOPIC (NOT AT [facility])  URINE RAPID DRUG SCREEN, HOSP PERFORMED    Imaging Review No results found. I have personally reviewed and evaluated these images and lab results as part of my medical decision-making.   EKG Interpretation None     EMERGENCY DEPARTMENT [place] PREGNANCY "Study: Limited Ultrasound of the Pelvis for Pregnancy"  INDICATIONS:Pregnancy(required) Multiple views of the uterus and pelvic cavity were obtained in real-time with a multi-frequency probe.  APPROACH:Transabdominal   PERFORMED BY: Myself  IMAGES ARCHIVED?: Yes  LIMITATIONS: Body habitus  PREGNANCY FREE FLUID: None  PREGNANCY FINDINGS: Fetal heart activity seen  INTERPRETATION: Viable intrauterine pregnancy  GESTATIONAL AGE, ESTIMATE: 16  FETAL HEART RATE:  150  CPT Codes:  79024-09 (transabdominal OB)  768-26-52 (transvaginal OB, Reduced level of service for incomplete exam)     MDM   Final diagnoses:  Antihistamines overdose, intentional self-harm, initial encounter     Patient is a 29 year old female with a history of bipolar, [redacted] weeks pregnant that presents after intentional overdose with Vistaril. Patient states that 4 months ago they stopped her Tegretol due to pregnancy and since then she's had worsening mood swings. Patient states she's been arguing frequently with her fianc and mother and yesterday wanted to take Vistaril to make that stopped. Patient denies any attempt to end her life. Patient currently denies SI at this time. When explained that with the patient taken potentially kill her she was surprised by this fact and stated she just wanted to go to sleep so the arguing would stop. Patient states she's had no symptoms after ingesting the Vistaril. On arrival to the ED the patient is afebrile stable vital signs. Patient's has a normal neurologic exam without hyperreflexia or any signs of antihistamine overdose. Patient denies any abdominal pain, vaginal bleeding, vaginal fluid loss.  Pharmacy found out that the patient had 60 Vistaril filled on the sixth and patient states she has none left at this time. Patient did say that the overdose was a attempt to get help. We will do suicide screening labs, consult psychiatry, UA to assess for UTI and pregnancy.  Psychiatry evaluated the patient and feel that this time she warrants inpatient admission. Patient will be awaiting placement. Patient's psych screening labs are unremarkable. Urine is still pending. If UTIs present we will treat as the patient is pregnant.  Renne Musca, MD 09/06/15 [place], MD 09/06/15 1600
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
## 2015-09-06 NOTE — Progress Notes (Signed)
Seeking inpatient psychiatric placement. (Note facilities listed are able to consider patients who are pregnant on a case-by-case basis)  Referred to: [facility]- per [facility]- per Earl Lites- per Darlene Left voicemails with [facility] and [facility] and will refer if appropriate.  All other facilities contact are either at capacity or pregnancy is exclusionary for admission.   Sharren Bridge, MSW, LCSW Clinical Social Work, Disposition  09/06/2015 [phone]

## 2015-09-06 NOTE — ED Notes (Signed)
MD at the bedside  

## 2015-09-06 NOTE — ED Notes (Addendum)
Pt arrived to [facility] - ambulatory w/fiance - wearing personal clothing. Pt given paper scrubs and asked to change. Pt initially refusing. Pt asking for cab voucher for fiance - advised her not available and advised he may call someone to come pick him up.

## 2015-09-06 NOTE — ED Notes (Signed)
Pt made one phone call for today.

## 2015-09-06 NOTE — ED Notes (Addendum)
Mali, Agricultural consultant, and Pharmacist, hospital, Staffing, aware of need for sitter.

## 2015-09-06 NOTE — ED Notes (Addendum)
Pt wanded by security. Pt on phone calling her mother. Pt aware of need for urine specimen. Pt voiced understanding of need for urine specimen.

## 2015-09-06 NOTE — ED Notes (Signed)
Per [place], SW, [facility], seeking placement at [facility] or [facility] d/t pt 4 months pregnant.

## 2015-09-06 NOTE — Progress Notes (Signed)
LCSW received call from [facility] ACT team:  [name]. Updated that patient is needing inpatient, currently voluntary, and possibly IVC if patient attempts to leave. Currently now she is complying.  Patient will be referred to perinatal programs for MH and SA.  Discussed with [facility] Meghan who is in agreement.  Lane Hacker, MSW Clinical Social Work: Emergency Room [phone]

## 2015-09-06 NOTE — ED Notes (Signed)
Phlebotomy at the bedside  

## 2015-09-06 NOTE — BH Assessment (Signed)
Per May, NP - patient meets criteria for inpatient hospitalization.  Writer informed the [facility] Claiborne Billings).  Writer informed  the RN Orvil Feil) of the patients disposition. The ER MD was unavailable and the RN will inform him of the disposition.   CSW Trinda Pascal will seek placement.

## 2015-09-07 NOTE — ED Notes (Addendum)
Patient requesting to take a shower, patient escorted to shower by sitter. Patients sitter advised the floor was wet from environmental mopping. Pt advised she would just wait and shower later.

## 2015-09-07 NOTE — ED Notes (Signed)
Patient requesting another update on anything new with her care. Advised patient nothing new to report and that this RN will keep her updated if anything changes.

## 2015-09-07 NOTE — ED Notes (Signed)
[employer] Officer Ysidro Evert in ED to sign IVC paperwork.

## 2015-09-07 NOTE — ED Notes (Signed)
Patients mother called requesting the EDP to speak with her. Notified RN of this and she will make EDP aware.  Mothers number- [phone]

## 2015-09-07 NOTE — ED Notes (Signed)
[employer] transportation services called and advised they would arrive in approx 1.5 hrs to pick up patient for transport to [facility].

## 2015-09-07 NOTE — ED Notes (Signed)
[name] made aware that patient will remain under IVC and will need transport to [facility].

## 2015-09-07 NOTE — ED Notes (Signed)
Megan from [facility] advised [facility] will accept patient only if she is IVC'ed. Megan advised she would talk to Dr. Colin Rhein regarding IVC paperwork for patient.

## 2015-09-07 NOTE — ED Notes (Signed)
Patient advised that she will be going to [facility]. Patient stating "do you think if my mom calls and talks to the doctor, they will change their mind about making me go there. " This RN advised patient that the doctor is not going to change his mind and that because she took medication to hurt herself, for her safety, she is required to go to the facility that has accepted her for treatment. Pt replied stating, "I did not try to hurt myself." patient asking this RN how long she will be at [facility], this RN advised that I do not know that information and that will be determined after she arrives at the facility for treatment.

## 2015-09-07 NOTE — ED Notes (Signed)
Patient up to restroom, escorted by her sitter.

## 2015-09-07 NOTE — ED Notes (Addendum)
Deno Lunger at the [employer] made aware that the patient we originally called for transportation for is awaiting reassessment at this time and that patient may or may not need transport to [facility]. Raechel Chute Paschal advised this RN if another request for transportation of the patient is not made before 1900, patient will not be able to be transported to [facility] until tomorrow.

## 2015-09-07 NOTE — ED Notes (Signed)
Patient hanging out at the desk requesting to have the number to [facility], patient advised I cannot give her the number to [facility]. This RN asked the patient to please return to her room.

## 2015-09-07 NOTE — Progress Notes (Addendum)
Pt accepted to [facility] by Dr. Miquel Dunn, report number for RN is [phone]. Pt can be transported when IVC paperwork is ready. Spoke with EDP and and RN re: pt's placement.  Received call from [place] with pt's [facility] [place] [facility]). Informed her of plan to transfer. Barnett Applebaum communicated that pt has denies any SA since learning of pregnancy. States she will be in contact with [facility] in order to facilitate continuity of care.   Sharren Bridge, MSW, LCSW Clinical Social Work, Disposition  09/07/2015 [phone]

## 2015-09-07 NOTE — ED Provider Notes (Signed)
Assumed care of pt at shift change.  Pt did not want to go to [facility].  I spoke with the psychiatrist who is familiar with the pt and feels the pt is at high risk.  Transferred to [facility] and [place].    1. Antihistamines overdose, intentional self-harm, initial encounter      Debby Freiberg, MD 09/08/15 [phone]

## 2015-09-07 NOTE — ED Notes (Signed)
Patient refused to place dinner tray order, advising "I don't want anything for dinner."

## 2015-09-07 NOTE — ED Notes (Signed)
Officer perry from [employer] here to transport patient.

## 2015-09-07 NOTE — ED Notes (Signed)
Belongings given to officer Henrene Pastor from [employer] office prior to patient's transport to [facility].

## 2015-09-07 NOTE — ED Notes (Signed)
Patient requesting update on plan of care, patient advised nothing new to report. This RN advised her I will let her know if anything changes.

## 2015-09-07 NOTE — ED Notes (Signed)
Original IVC paperwork placed in red folder in Social Work Office.

## 2015-09-07 NOTE — ED Notes (Signed)
Spoke with Deputy O'Bryan at [place] [facility] in regards to patient being transported to [facility]. Deputy O'Bryan advised they would call back when they had a pick up time for the patient.

## 2015-09-07 NOTE — ED Notes (Signed)
Patient requesting this RN to come to her room. Upon arrival to patients room, patient asking when she is going to [facility] and requesting this RN to call and find out how much longer it is going to be. Advised patient sherriff's office is aware of her need for transport and they will be here as soon as they can be.

## 2015-09-07 NOTE — ED Notes (Signed)
Dr. Colin Rhein in to speak with patient. After speaking with patient, Dr. Colin Rhein advised this RN that he is going to speak with Psych again in regards to a possible reassessment of patient

## 2015-09-07 NOTE — ED Notes (Signed)
[place] sherriff's office called in regards to transporting patient to [facility], Officer advised that patient is awaiting reassessment and may not be going to [facility]. Dr. Colin Rhein aware of this as well.

## 2015-09-07 NOTE — ED Notes (Signed)
Patient now advising that she would like a dinner tray. Service response called and food tray ordered for patient.

## 2015-09-07 NOTE — ED Notes (Signed)
Dr. Colin Rhein made aware that patient's mother has called requesting him to call her and explain what is going on with patient.

## 2015-09-07 NOTE — ED Notes (Signed)
Copy of ivc paperwork signed by [facility] officer placed in medical records along with copy of iVc service and observation form that was signed by sherriffs deputy perry.

## 2015-09-20 ENCOUNTER — Encounter (HOSPITAL_COMMUNITY): Payer: Self-pay

## 2015-09-20 ENCOUNTER — Inpatient Hospital Stay (HOSPITAL_COMMUNITY)
Admission: AD | Admit: 2015-09-20 | Discharge: 2015-09-20 | Disposition: A | Discharge status: Home / Self Care | Payer: Medicare Other | Source: Ambulatory Visit | Attending: Obstetrics and Gynecology | Admitting: Obstetrics and Gynecology

## 2015-09-20 DIAGNOSIS — O26892 Other specified pregnancy related conditions, second trimester: Secondary | ICD-10-CM | POA: Insufficient documentation

## 2015-09-20 DIAGNOSIS — F419 Anxiety disorder, unspecified: Secondary | ICD-10-CM | POA: Insufficient documentation

## 2015-09-20 DIAGNOSIS — O219 Vomiting of pregnancy, unspecified: Secondary | ICD-10-CM

## 2015-09-20 DIAGNOSIS — R112 Nausea with vomiting, unspecified: Secondary | ICD-10-CM | POA: Insufficient documentation

## 2015-09-20 DIAGNOSIS — R1012 Left upper quadrant pain: Secondary | ICD-10-CM | POA: Diagnosis not present

## 2015-09-20 DIAGNOSIS — F329 Major depressive disorder, single episode, unspecified: Secondary | ICD-10-CM | POA: Insufficient documentation

## 2015-09-20 DIAGNOSIS — F909 Attention-deficit hyperactivity disorder, unspecified type: Secondary | ICD-10-CM | POA: Insufficient documentation

## 2015-09-20 DIAGNOSIS — F319 Bipolar disorder, unspecified: Secondary | ICD-10-CM | POA: Insufficient documentation

## 2015-09-20 DIAGNOSIS — Z3A22 22 weeks gestation of pregnancy: Secondary | ICD-10-CM | POA: Insufficient documentation

## 2015-09-20 DIAGNOSIS — J45909 Unspecified asthma, uncomplicated: Secondary | ICD-10-CM | POA: Insufficient documentation

## 2015-09-20 LAB — CBC
HCT: 38 % (ref 36.0–46.0)
Hemoglobin: 12.8 g/dL (ref 12.0–15.0)
MCH: 31.1 pg (ref 26.0–34.0)
MCHC: 33.7 g/dL (ref 30.0–36.0)
MCV: 92.2 fL (ref 78.0–100.0)
Platelets: 149 10*3/uL — ABNORMAL LOW (ref 150–400)
RBC: 4.12 MIL/uL (ref 3.87–5.11)
RDW: 13.2 % (ref 11.5–15.5)
WBC: 6.5 10*3/uL (ref 4.0–10.5)

## 2015-09-20 LAB — COMPREHENSIVE METABOLIC PANEL
ALT: 40 U/L (ref 14–54)
AST: 33 U/L (ref 15–41)
Albumin: 3.3 g/dL — ABNORMAL LOW (ref 3.5–5.0)
Alkaline Phosphatase: 57 U/L (ref 38–126)
Anion gap: 6 (ref 5–15)
BUN: 5 mg/dL — ABNORMAL LOW (ref 6–20)
CO2: 27 mmol/L (ref 22–32)
Calcium: 9 mg/dL (ref 8.9–10.3)
Chloride: 104 mmol/L (ref 101–111)
Creatinine, Ser: 0.7 mg/dL (ref 0.44–1.00)
GFR calc Af Amer: >60 mL/min (ref >60)
GFR calc non Af Amer: >60 mL/min (ref >60)
Glucose, Bld: 91 mg/dL (ref 65–99)
Potassium: 3.9 mmol/L (ref 3.5–5.1)
Sodium: 137 mmol/L (ref 135–145)
Total Bilirubin: 0.5 mg/dL (ref 0.3–1.2)
Total Protein: 7.1 g/dL (ref 6.5–8.1)

## 2015-09-20 MED ORDER — PROMETHAZINE HCL 25 MG/ML IJ SOLN
25.0000 mg | Freq: Once | INTRAMUSCULAR | Status: AC
  Administered 2015-09-20: 25 mg via INTRAVENOUS
  Filled 2015-09-20: qty 1

## 2015-09-20 MED ORDER — PROMETHAZINE HCL 25 MG PO TABS: 12.5000 mg | ORAL_TABLET | Freq: Four times a day (QID) | ORAL | Status: DC | PRN

## 2015-09-20 MED ORDER — DEXTROSE 5 % IN LACTATED RINGERS IV BOLUS
1000.0000 mL | Freq: Once | INTRAVENOUS | Status: AC
  Administered 2015-09-20: 1000 mL via INTRAVENOUS

## 2015-09-20 NOTE — MAU Note (Signed)
Patient did not drink ginger ale or eat crackers as requested. After going over discharge instructions patient refused to have BP taken. The importance of knowing vitals before discharge was explained and patient still refused.

## 2015-09-20 NOTE — MAU Note (Signed)
Patient given saltine crackers and gingerale and told due to the fact that she has had vomiting for 4 days, explained will be discharging to home soon.

## 2015-09-20 NOTE — Discharge Instructions (Signed)

## 2015-09-20 NOTE — MAU Provider Note (Signed)
Chief Complaint: Nausea and Emesis   First Provider Initiated Contact with Patient 09/20/15 1258      SUBJECTIVE HPI: [name] is a 29 y.o. G1P0 at [redacted]w[redacted]d by LMP who presents to maternity admissions reporting nausea/vomiting in last 4 days making it difficult to keep down food/fluids. [name] in room with pt and answered most questions, although pt did respond appropriately when questioned.  Per chart review, patient has diagnosis of bipolar, anxiety, and depression and had recent involuntary commitment to [facility] x 4 days for overdose of Vistaril.  Pt reports doing well since this admission and reports positive feelings about pregnancy/baby.  She denies suicidal thoughts/ideas today.  She reports she is safe where she lives.  Request in chart for social work consult so order placed and social work to evaluate pt needs in MAU.  She denies vaginal bleeding, vaginal itching/burning, urinary symptoms, h/a, dizziness, n/v, or fever/chills.     Emesis  This is a new problem. The current episode started in the past 7 days. The problem occurs 5 to 10 times per day. The problem has been gradually worsening. The emesis has an appearance of stomach contents. There has been no fever. Associated symptoms include abdominal pain. Pertinent negatives include no chest pain, chills, diarrhea, dizziness, fever or headaches. Treatments tried: Pepto Bismol. The treatment provided no relief.    Past Medical History  Diagnosis Date  . Bipolar disorder ([place])   . Attention deficit hyperactivity disorder   . Depression   . Anxiety   . Asthma   . UTI (urinary tract infection)     Completed Cipro 08/06/12  . Gastritis    Past Surgical History  Procedure Laterality Date  . Mouth surgery    . Eye muscle surgery Bilateral     07/29/12  . Plantar fascia surgery     Social History   Social History  . Marital Status: Single    Spouse Name: N/A  . Number of Children: 0  . Years of Education: N/A    Occupational History  . Not on file.   Social History Main Topics  . Smoking status: Never Smoker   . Smokeless tobacco: Never Used  . Alcohol Use: No  . Drug Use: No  . Sexual Activity: Yes    Birth Control/ Protection: None   Other Topics Concern  . Not on file   Social History Narrative   No current facility-administered medications on file prior to encounter.   Current Outpatient Prescriptions on File Prior to Encounter  Medication Sig Dispense Refill  . albuterol (PROVENTIL HFA;VENTOLIN HFA) 108 (90 BASE) MCG/ACT inhaler Inhale 2 puffs into the lungs every 6 (six) hours as needed for wheezing or shortness of breath.    . citalopram (CELEXA) 40 MG tablet Take 1 tablet (40 mg total) by mouth every morning. For depression (Patient taking differently: Take 40 mg by mouth daily. ) 30 tablet 0  . hydrOXYzine (VISTARIL) 25 MG capsule Take 25 mg by mouth 3 (three) times daily as needed.    . Prenatal Vit-Fe Fumarate-FA (PRENATAL MULTIVITAMIN) TABS tablet Take 2 tablets by mouth daily at 12 noon.     . traZODone (DESYREL) 150 MG tablet Take 1 tablet (150 mg total) by mouth at bedtime. 30 tablet 0   Allergies  Allergen Reactions  . Depakote [Divalproex Sodium] Other (See Comments)    Pt stated that her blood level was toxic when she had this medication.    . Methylphenidate Derivatives Other (  See Comments)    Reaction:  Depression and anger   . Neurontin [Gabapentin] Other (See Comments)    Reaction:  Dizziness   . Prozac [Fluoxetine Hcl] Other (See Comments)    Reaction:  Anger     ROS:  Review of Systems  Constitutional: Negative for fever, chills and fatigue.  HENT: Negative for sinus pressure.   Eyes: Negative for photophobia.  Respiratory: Negative for shortness of breath.   Cardiovascular: Negative for chest pain.  Gastrointestinal: Positive for vomiting and abdominal pain. Negative for nausea, diarrhea and constipation.  Genitourinary: Negative for dysuria,  frequency, flank pain, vaginal bleeding, vaginal discharge, difficulty urinating, vaginal pain and pelvic pain.  Musculoskeletal: Negative for neck pain.  Neurological: Negative for dizziness, weakness and headaches.  Psychiatric/Behavioral: Negative.      I have reviewed patient's Past Medical Hx, Surgical Hx, Family Hx, Social Hx, medications and allergies.   Physical Exam  Patient Vitals for the past 24 hrs:  BP Temp Pulse Resp  09/20/15 1227 116/76 mmHg 98.3 F (36.8 C) 80 16   Constitutional: Well-developed, well-nourished female in no acute distress.  Cardiovascular: normal rate Respiratory: normal effort GI: Abd soft, non-tender. Pos BS x 4 MS: Extremities nontender, no edema, normal ROM Neurologic: Alert and oriented x 4.  GU: Neg CVAT.  PELVIC EXAM: Cervix pink, visually closed, without lesion, scant white creamy discharge, vaginal walls and external genitalia normal Bimanual exam: Cervix 0/long/high, firm, anterior, neg CMT, uterus nontender, nonenlarged, adnexa without tenderness, enlargement, or mass  FHT 147 by doppler  LAB RESULTS Results for orders placed or performed during the hospital encounter of 09/20/15 (from the past 24 hour(s))  CBC     Status: Abnormal   Collection Time: 09/20/15 12:55 PM  Result Value Ref Range   WBC 6.5 4.0 - 10.5 K/uL   RBC 4.12 3.87 - 5.11 MIL/uL   Hemoglobin 12.8 12.0 - 15.0 g/dL   HCT 38.0 36.0 - 46.0 %   MCV 92.2 78.0 - 100.0 fL   MCH 31.1 26.0 - 34.0 pg   MCHC 33.7 30.0 - 36.0 g/dL   RDW 13.2 11.5 - 15.5 %   Platelets 149 (L) 150 - 400 K/uL  Comprehensive metabolic panel     Status: Abnormal   Collection Time: 09/20/15 12:55 PM  Result Value Ref Range   Sodium 137 135 - 145 mmol/L   Potassium 3.9 3.5 - 5.1 mmol/L   Chloride 104 101 - 111 mmol/L   CO2 27 22 - 32 mmol/L   Glucose, Bld 91 65 - 99 mg/dL   BUN 5 (L) 6 - 20 mg/dL   Creatinine, Ser 0.70 0.44 - 1.00 mg/dL   Calcium 9.0 8.9 - 10.3 mg/dL   Total Protein 7.1  6.5 - 8.1 g/dL   Albumin 3.3 (L) 3.5 - 5.0 g/dL   AST 33 15 - 41 U/L   ALT 40 14 - 54 U/L   Alkaline Phosphatase 57 38 - 126 U/L   Total Bilirubin 0.5 0.3 - 1.2 mg/dL   GFR calc non Af Amer >60 >60 mL/min   GFR calc Af Amer >60 >60 mL/min   Anion gap 6 5 - 15       IMAGING No results found.  MAU Management/MDM: Ordered labs and reviewed results.  Consult Dr Melba Coon.  Consult Social Work who came to room to evaluate pt needs.  Treatments in MAU included D5LR x 1000 ml, Phenergan 25 mg IV. Pt reported improvement in nausea and  asked for food tray.  Pt offered crackers/ginger ale which she declined.  Pt discharged and encouraged to eat as tolerated.  Pt stable at time of discharge.   ASSESSMENT 1. Nausea and vomiting during pregnancy prior to [redacted] weeks gestation     PLAN Discharge home F/U as scheduled in office Phenergan 12.5-25 mg PO Q 6 hours       Follow-up Information    Follow up with Janyth Contes, MD.   Specialty:  Obstetrics and Gynecology   Why:  As scheduled   Contact information:   [address] [address] [phone]       Follow up with [place].   Why:  As needed for emergencies   Contact information:   [address] [id number] [place] Certified Nurse-Midwife 09/20/2015  4:58 PM

## 2015-09-20 NOTE — MAU Note (Signed)
Social Work at bedside 

## 2015-09-20 NOTE — Clinical Social Work Note (Signed)
Clinical Social Work Assessment  Patient Details  Name: [name] MRN: [id number] Date of Birth: [birth year]  Date of referral:  09/20/15               Reason for consult:  Provider requested consult due to recent IVC.            Permission sought to share information with:  Family Supports Permission granted to share information::  Yes, Verbal Permission Granted  Name::     [name], [name]/signficiant other  Agency::  N/A  Relationship::  N/A  Contact Information:  N/A  Housing/Transportation Living arrangements for the past 2 months:  Mobile Home Source of Information:  Patient, Partner, Charter Review Patient Interpreter Needed:  None Criminal Activity/Legal Involvement Pertinent to Current Situation/Hospitalization:  No  Significant Relationships:  Significant Other, Parents, Mental Health Provider (ACTT team at [facility]) Lives with:  Parents, Significant Other Do you feel safe going back to the place where you live?  Yes Need for family participation in patient care:  Yes. Patient's participation was limited due to being tired and groggy.   Care giving concerns:  N/A  Facilities manager / plan:   Due to patient's presentation, majority of information obtained from [name]; however, the [name] was also a limited historian since he often reported that he did not know the answer to CSW information.   [name] stated that they currently live with the patient's mother in a trailer home, and endorsed feeling supported. He stated that the patient's mother has diagnosis of COPD and is "sick". He was unable to further clarify her symptoms.  The [name] reported that all basic needs are met, and confirmed that they bills are able to be paid and they have food on their table. The [name] shared that he has recently been approved for disability, and confirmed that the patient also receives disability. He reported that he is familiar with Medicaid transportation, and expressed comfort in arranging  for transportation for himself and the patient.  [name] also confirmed that he has transportation home once the patient is discharged from the MAU.  Per chart review, patient has diagnosis of bipolar, anxiety, and depression.  Current psychotropic medications include Celexa and Vistaril, and recent behavioral assessment documents that patient was previously prescribed Tegretol.  Patient and [name] confirmed that patient currently receives mental health care with an ACTT Team at [place]. Per [name], she has been receiving ACTT Team services for more than 5 months.  Patient confirmed that a member of the care team is at her home every other day.  The [name] stated that the psychiatrist came to their home today, but then EMS arrived to transport her to the MAU.   Patient and [name] acknowledged recent [place] and transfer to [facility]. Per chart review, patient evaluated in the ED on 9/22, and transferred to [facility] on 9/23 secondary to intentional overdose of 50 Vistaril pills after an argument between the patient and her uncle.  The [name] stated that she was hospitalized for 4 days. He stated that the patient is "good" since she has returned home.  The patient declined offer to process the recent overdose.    Patient denied any unmet needs at this time.  She inquired about reason for CSW visit since she does not believe that she needs to be hospitalized at this time. Patient denied SI/HI. No delusions noted.  Patient was able to answer questions appropriately, but presented as minimally interested in discussing  her mental health and/or psychosocial stressors. Patient would ask questions about food and ability to go to the bathroom.   Patient reported feeling "excited" about the pregnancy. She stated that she has wanted a baby for a "long time". Patient shared that she is pregnancy with a little girl, and reported that the baby will be named "[name]".  She denied any feelings of stress or worry as  she prepares to become a mother. The [name] stated that the patient's cousin will have a baby shower for them next month to help them prepare for the baby since they have only obtained a few basic items (clothing).   Employment status:  Disabled, receives disability Insurance information:  Medicare, Medicaid In [place] PT Recommendations:  Not assessed at this time Information / Referral to community resources:  CSW offered referral to [facility] case management. Patient declined.  Patient/Family's Response to care:  Patient was difficult to engage and minimally receptive to the visit. She was sleeping upon CSW and MSW Intern arrival.  The [name] attempted to arouse patient, but she was only awake for brief period of time before returning to sleep.  Patient presented as suspicious of CSW, and asked if she was being assessed for inpatient psychiatric admission since she has extensive history of assessment by clinical social workers to evaluate for need for inpatient care.  Patient denied need for any CSW intervention, support, or referrals, and was unable to identify any areas of need.  Patient/Family's Understanding of and Emotional Response to Diagnosis, Current Treatment, and Prognosis:  Patient and [name] presented with understanding of events that led to MAU visit, current course of treatment, and likely discharge recommendations.    Patient and [name] acknowledged CSW ongoing availability at [facility], and acknowledged that CSW will visit with the family once the infant has been born.  Emotional Assessment Appearance:  Disheveled, Younger than stated age Attitude/Demeanor/Rapport:  Suspicious, Lethargic, Tired, Difficult to engage Affect (typically observed):  Flat, Blunt Orientation:  Oriented to Self, Oriented to Place, Oriented to  Time, Oriented to Situation Alcohol / Substance use:  Not Applicable Psych involvement (Current and /or in the community):  Outpatient Provider-- [facility]  team  Discharge Needs  Concerns to be addressed:  No discharge needs identified Readmission within the last 30 days:  Yes Current discharge risk:  Psychiatric Illness Barriers to Discharge:  No Barriers Identified   Sheilah Mins, LCSW 09/20/2015, 3:45 PM

## 2015-09-20 NOTE — MAU Note (Signed)
Pt presents to MAU with complaints of nausea and vomiting for 4 days. Denies any vaginal bleeding. Pt states she is very weak.

## 2015-09-28 DIAGNOSIS — R939 Diagnostic imaging inconclusive due to excess body fat of patient: Secondary | ICD-10-CM | POA: Diagnosis not present

## 2015-09-28 DIAGNOSIS — Z3A18 18 weeks gestation of pregnancy: Secondary | ICD-10-CM | POA: Diagnosis not present

## 2015-09-28 DIAGNOSIS — O99212 Obesity complicating pregnancy, second trimester: Secondary | ICD-10-CM | POA: Diagnosis not present

## 2015-10-08 ENCOUNTER — Inpatient Hospital Stay (HOSPITAL_COMMUNITY)
Admission: AD | Admit: 2015-10-08 | Discharge: 2015-10-08 | Disposition: A | Discharge status: Home / Self Care | Payer: Medicare Other | Source: Ambulatory Visit | Attending: Obstetrics and Gynecology | Admitting: Obstetrics and Gynecology

## 2015-10-08 ENCOUNTER — Encounter (HOSPITAL_COMMUNITY): Payer: Self-pay | Admitting: *Deleted

## 2015-10-08 DIAGNOSIS — Z3A20 20 weeks gestation of pregnancy: Secondary | ICD-10-CM | POA: Insufficient documentation

## 2015-10-08 DIAGNOSIS — O212 Late vomiting of pregnancy: Secondary | ICD-10-CM | POA: Insufficient documentation

## 2015-10-08 DIAGNOSIS — O219 Vomiting of pregnancy, unspecified: Secondary | ICD-10-CM

## 2015-10-08 DIAGNOSIS — R112 Nausea with vomiting, unspecified: Secondary | ICD-10-CM | POA: Diagnosis not present

## 2015-10-08 LAB — URINALYSIS, ROUTINE W REFLEX MICROSCOPIC
Bilirubin Urine: NEGATIVE
Glucose, UA: NEGATIVE mg/dL
Hgb urine dipstick: NEGATIVE
Ketones, ur: NEGATIVE mg/dL
Leukocytes, UA: NEGATIVE
Nitrite: NEGATIVE
Protein, ur: NEGATIVE mg/dL
Specific Gravity, Urine: >1.03 — ABNORMAL HIGH (ref 1.005–1.030)
Urobilinogen, UA: 0.2 mg/dL (ref 0.0–1.0)
pH: 6 (ref 5.0–8.0)

## 2015-10-08 NOTE — MAU Note (Signed)
Pt presents to MAU with complaints of nausea and vomiting since this morning. Denies any vaginal bleeding.

## 2015-10-08 NOTE — MAU Provider Note (Signed)
History   G1 at 20.1 wks with c/o feeling "dehydrated" in same sentence pt is requesting food. States is feeling Marcille Buffy but is able to keep food and fluids down.  CSN: [id number]  Arrival date and time: 10/08/15 1638   None     No chief complaint on file.  HPI  OB History    Gravida Para Term Preterm AB TAB SAB Ectopic Multiple Living   1 0              Past Medical History  Diagnosis Date  . Bipolar disorder ([place])   . Attention deficit hyperactivity disorder   . Depression   . Anxiety   . Asthma   . UTI (urinary tract infection)     Completed Cipro 08/06/12  . Gastritis     Past Surgical History  Procedure Laterality Date  . Mouth surgery    . Eye muscle surgery Bilateral     07/29/12  . Plantar fascia surgery      Family History  Problem Relation Age of Onset  . Depression Mother   . Depression Father   . Depression Brother   . Colon cancer Mother   . Colon polyps Mother   . Liver disease    . Kidney disease      Social History  Substance Use Topics  . Smoking status: Never Smoker   . Smokeless tobacco: Never Used  . Alcohol Use: No    Allergies:  Allergies  Allergen Reactions  . Depakote [Divalproex Sodium] Other (See Comments)    Pt stated that her blood level was toxic when she had this medication.    . Methylphenidate Derivatives Other (See Comments)    Reaction:  Depression and anger   . Neurontin [Gabapentin] Other (See Comments)    Reaction:  Dizziness   . Prozac [Fluoxetine Hcl] Other (See Comments)    Reaction:  Anger     Prescriptions prior to admission  Medication Sig Dispense Refill Last Dose  . albuterol (PROVENTIL HFA;VENTOLIN HFA) 108 (90 BASE) MCG/ACT inhaler Inhale 2 puffs into the lungs every 6 (six) hours as needed for wheezing or shortness of breath.   Past Week at Unknown time  . citalopram (CELEXA) 40 MG tablet Take 1 tablet (40 mg total) by mouth every morning. For depression (Patient taking differently: Take 40 mg by  mouth daily. ) 30 tablet 0 09/05/2015 at Unknown time  . hydrOXYzine (VISTARIL) 25 MG capsule Take 25 mg by mouth 3 (three) times daily as needed.   09/05/2015 at Unknown time  . Prenatal Vit-Fe Fumarate-FA (PRENATAL MULTIVITAMIN) TABS tablet Take 2 tablets by mouth daily at 12 noon.    09/05/2015 at Unknown time  . promethazine (PHENERGAN) 25 MG tablet Take 0.5-1 tablets (12.5-25 mg total) by mouth every 6 (six) hours as needed for nausea. 30 tablet 0   . traZODone (DESYREL) 150 MG tablet Take 1 tablet (150 mg total) by mouth at bedtime. 30 tablet 0 09/05/2015 at Unknown time    Review of Systems  Constitutional: Negative.   HENT: Negative.   Eyes: Negative.   Respiratory: Negative.   Gastrointestinal: Positive for nausea.  Genitourinary: Negative.   Musculoskeletal: Negative.   Skin: Negative.   Neurological: Negative.   Endo/Heme/Allergies: Negative.   Psychiatric/Behavioral: Negative.    Physical Exam   Blood pressure 131/75, pulse 95, resp. rate 18, height 5\' 6"  (1.676 m), weight 224 lb (101.606 kg), last menstrual period 05/12/2015.  Physical Exam  Constitutional: She is  oriented to person, place, and time. She appears well-developed and well-nourished.  HENT:  Head: Normocephalic.  Eyes: Pupils are equal, round, and reactive to light.  Neck: Normal range of motion.  Cardiovascular: Normal rate, regular rhythm, normal heart sounds and intact distal pulses.   Respiratory: Effort normal and breath sounds normal.  GI: Soft. Bowel sounds are normal.  Musculoskeletal: Normal range of motion.  Neurological: She is alert and oriented to person, place, and time. She has normal reflexes.  Skin: Skin is warm and dry.  Psychiatric: She has a normal mood and affect. Her behavior is normal. Judgment and thought content normal.    MAU Course  Procedures  MDM Nausea of pregnancy   Assessment and Plan  Will feed pt and see if she retains po's . Pt left AMA  Telvin Reinders  DARLENE 10/08/2015, 5:02 PM

## 2015-10-08 NOTE — MAU Note (Signed)
Pt presents to desk and states that she has to leave because her ride is here to get her.

## 2015-10-15 DIAGNOSIS — R939 Diagnostic imaging inconclusive due to excess body fat of patient: Secondary | ICD-10-CM | POA: Diagnosis not present

## 2015-10-15 DIAGNOSIS — Z3A21 21 weeks gestation of pregnancy: Secondary | ICD-10-CM | POA: Diagnosis not present

## 2015-10-15 DIAGNOSIS — O09212 Supervision of pregnancy with history of pre-term labor, second trimester: Secondary | ICD-10-CM | POA: Diagnosis not present

## 2015-11-06 ENCOUNTER — Encounter (HOSPITAL_COMMUNITY): Payer: Self-pay | Admitting: *Deleted

## 2015-11-06 ENCOUNTER — Inpatient Hospital Stay (HOSPITAL_COMMUNITY)
Admission: AD | Admit: 2015-11-06 | Discharge: 2015-11-06 | Disposition: A | Discharge status: Home / Self Care | Payer: Medicare Other | Source: Ambulatory Visit | Attending: Obstetrics and Gynecology | Admitting: Obstetrics and Gynecology

## 2015-11-06 DIAGNOSIS — H538 Other visual disturbances: Secondary | ICD-10-CM | POA: Diagnosis not present

## 2015-11-06 DIAGNOSIS — R404 Transient alteration of awareness: Secondary | ICD-10-CM | POA: Diagnosis not present

## 2015-11-06 DIAGNOSIS — Z3A24 24 weeks gestation of pregnancy: Secondary | ICD-10-CM | POA: Insufficient documentation

## 2015-11-06 DIAGNOSIS — R531 Weakness: Secondary | ICD-10-CM | POA: Diagnosis not present

## 2015-11-06 DIAGNOSIS — O26892 Other specified pregnancy related conditions, second trimester: Secondary | ICD-10-CM | POA: Diagnosis not present

## 2015-11-06 NOTE — MAU Note (Signed)
C/o blurred vision for past 2 days;

## 2015-11-06 NOTE — Discharge Instructions (Signed)
Second Trimester of Pregnancy  The second trimester is from week 13 through week 28, month 4 through 6. This is often the time in pregnancy that you feel your best. Often times, morning sickness has lessened or quit. You may have more energy, and you may get hungry more often. Your unborn baby (fetus) is growing rapidly. At the end of the sixth month, he or she is about 9 inches long and weighs about 1½ pounds. You will likely feel the baby move (quickening) between 18 and 20 weeks of pregnancy.  HOME CARE   · Avoid all smoking, herbs, and alcohol. Avoid drugs not approved by your doctor.  · Do not use any tobacco products, including cigarettes, chewing tobacco, and electronic cigarettes. If you need help quitting, ask your doctor. You may get counseling or other support to help you quit.  · Only take medicine as told by your doctor. Some medicines are safe and some are not during pregnancy.  · Exercise only as told by your doctor. Stop exercising if you start having cramps.  · Eat regular, healthy meals.  · Wear a good support bra if your breasts are tender.  · Do not use hot tubs, steam rooms, or saunas.  · Wear your seat belt when driving.  · Avoid raw meat, uncooked cheese, and liter boxes and soil used by cats.  · Take your prenatal vitamins.  · Take 1500-2000 milligrams of calcium daily starting at the 20th week of pregnancy until you deliver your baby.  · Try taking medicine that helps you poop (stool softener) as needed, and if your doctor approves. Eat more fiber by eating fresh fruit, vegetables, and whole grains. Drink enough fluids to keep your pee (urine) clear or pale yellow.  · Take warm water baths (sitz baths) to soothe pain or discomfort caused by hemorrhoids. Use hemorrhoid cream if your doctor approves.  · If you have puffy, bulging veins (varicose veins), wear support hose. Raise (elevate) your feet for 15 minutes, 3-4 times a day. Limit salt in your diet.  · Avoid heavy lifting, wear low heals,  and sit up straight.  · Rest with your legs raised if you have leg cramps or low back pain.  · Visit your dentist if you have not gone during your pregnancy. Use a soft toothbrush to brush your teeth. Be gentle when you floss.  · You can have sex (intercourse) unless your doctor tells you not to.  · Go to your doctor visits.  GET HELP IF:   · You feel dizzy.  · You have mild cramps or pressure in your lower belly (abdomen).  · You have a nagging pain in your belly area.  · You continue to feel sick to your stomach (nauseous), throw up (vomit), or have watery poop (diarrhea).  · You have bad smelling fluid coming from your vagina.  · You have pain with peeing (urination).  GET HELP RIGHT AWAY IF:   · You have a fever.  · You are leaking fluid from your vagina.  · You have spotting or bleeding from your vagina.  · You have severe belly cramping or pain.  · You lose or gain weight rapidly.  · You have trouble catching your breath and have chest pain.  · You notice sudden or extreme puffiness (swelling) of your face, hands, ankles, feet, or legs.  · You have not felt the baby move in over an hour.  · You have severe headaches that do   not go away with medicine.  · You have vision changes.     This information is not intended to replace advice given to you by your health care provider. Make sure you discuss any questions you have with your health care provider.     Document Released: 02/25/2010 Document Revised: 12/22/2014 Document Reviewed: 02/01/2013  Elsevier Interactive Patient Education ©2016 Elsevier Inc.

## 2015-11-06 NOTE — Progress Notes (Signed)
CSW originally met patient on 10/6; therefore, full assessment not completed during this encounter.   CSW consult requested due to concerns about ability to access basic needs.   Patient reported that she vaguely remembers CSW from previous encounter.  She immediately asked CSW about access to a taxi voucher since she is concerned about how she will be getting home.  Patient stated that she arrived via EMS, and has no one at home who has access to a car.  She shared that she previously has been transported home by her uncle, but reported that he is out of town. Patient also reported that her church friends are at a funeral and are unavailable.  Patient stated that [place] not [place] on the bus line.  CSW to discuss arrangement of a taxi voucher with RN.  Patient reported that she has access to food, and stated that all basic needs are met at her home.  Patient shared that she has access to a case manager with her ACTT team (mental health team at [facility]) in the event that she no longer has access to basic needs.  Patient stated that she continues to work with her mental health providers, and denied any recent acute mental health concerns.    Patient denied additional needs at this time.   Re-consult CSW if additional needs arise.

## 2015-11-06 NOTE — MAU Note (Signed)
First thing pt asked me was could she get something to eat; insisted that her BPs be taken on her lower arm; asked for a phone to use; wanted her door left open and later wanted her door shut; asking about "how long is this going to take"; changed location of her pain from the R flank to the lower abdomen;

## 2015-11-06 NOTE — MAU Provider Note (Signed)
History     CSN: [id number]  Arrival date and time: 11/06/15 1448   First Provider Initiated Contact with Patient 11/06/15 1525      Chief Complaint  Patient presents with  . Eye Problem   HPI  [name] is a 29 y.o. G1P0 at [redacted]w[redacted]d who presents to MAU today by EMS with complaint of blurred vision. She states this has been present for 2 days. She states it is mild. She denies headache, abdominal pain or peripheral edema. She also denies vaginal bleeding, abnormal discharge or LOF. She states that earlier today she had some "side pain" but states that this is unlikely to be pregnancy related since she had this prior to pregnancy as well. She states that she has an appointment with [facility] in December. She wants to "make sure the baby is ok."   OB History    Gravida Para Term Preterm AB TAB SAB Ectopic Multiple Living   1 0              Past Medical History  Diagnosis Date  . Bipolar disorder ([place])   . Attention deficit hyperactivity disorder   . Depression   . Anxiety   . Asthma   . UTI (urinary tract infection)     Completed Cipro 08/06/12  . Gastritis     Past Surgical History  Procedure Laterality Date  . Mouth surgery    . Eye muscle surgery Bilateral     07/29/12  . Plantar fascia surgery      Family History  Problem Relation Age of Onset  . Depression Mother   . Depression Father   . Depression Brother   . Colon cancer Mother   . Colon polyps Mother   . Liver disease    . Kidney disease      Social History  Substance Use Topics  . Smoking status: Never Smoker   . Smokeless tobacco: Never Used  . Alcohol Use: No    Allergies:  Allergies  Allergen Reactions  . Depakote [Divalproex Sodium] Other (See Comments)    Pt stated that her blood level was toxic when she had this medication.    . Methylphenidate Derivatives Other (See Comments)    Reaction:  Depression and anger   . Neurontin [Gabapentin] Other (See Comments)    Reaction:   Dizziness   . Prozac [Fluoxetine Hcl] Other (See Comments)    Reaction:  Anger     Prescriptions prior to admission  Medication Sig Dispense Refill Last Dose  . citalopram (CELEXA) 40 MG tablet Take 1 tablet (40 mg total) by mouth every morning. For depression (Patient taking differently: Take 40 mg by mouth daily. ) 30 tablet 0 11/06/2015 at Unknown time  . hydrOXYzine (VISTARIL) 25 MG capsule Take 25 mg by mouth 2 (two) times daily.    11/06/2015 at Unknown time  . OLANZapine (ZYPREXA) 10 MG tablet Take 10 mg by mouth at bedtime.   11/05/2015 at Unknown time  . Prenatal Vit-Fe Fumarate-FA (PRENATAL MULTIVITAMIN) TABS tablet Take 2 tablets by mouth daily at 12 noon.    11/06/2015 at Unknown time  . traZODone (DESYREL) 150 MG tablet Take 1 tablet (150 mg total) by mouth at bedtime. 30 tablet 0 11/05/2015 at Unknown time  . albuterol (PROVENTIL HFA;VENTOLIN HFA) 108 (90 BASE) MCG/ACT inhaler Inhale 2 puffs into the lungs every 6 (six) hours as needed for wheezing or shortness of breath.   resscue    Review of  Systems  Constitutional: Negative for fever and malaise/fatigue.  Gastrointestinal: Negative for nausea, vomiting, abdominal pain, diarrhea and constipation.  Genitourinary:       Neg - vaginal bleeding, discharge, LOF   Physical Exam   Blood pressure 112/79, pulse 90, temperature 98.1 F (36.7 C), temperature source Oral, resp. rate 18, last menstrual period 05/12/2015.  Physical Exam  Nursing note and vitals reviewed. Constitutional: She is oriented to person, place, and time. She appears well-developed and well-nourished. No distress.  HENT:  Head: Normocephalic and atraumatic.  Cardiovascular: Normal rate.   Respiratory: Effort normal.  GI: Soft. She exhibits no distension and no mass. There is no tenderness. There is no rebound and no guarding.  Musculoskeletal: She exhibits no edema.  Neurological: She is alert and oriented to person, place, and time. She has normal  reflexes.  No clonus  Skin: Skin is warm and dry. No erythema.  Psychiatric: She has a normal mood and affect.   Fetal Monitoring: Baseline: 130 bpm, moderate variability, + 10 x 10 accelerations, no decelerations Contractions: none  MAU Course  Procedures None  MDM Discussed with Dr. Melba Coon. She states that patient has outpatient psychiatric follow-up already, but would like SW consult while she is in MAU today.  Patient allowed to order hot food today.  SW consult placed and RN to call Social Worker to see patient in MAU.  Assessment and Plan  A: SIUP at [redacted]w[redacted]d Social issues  P: Discharge home Third trimester precautions discussed Patient advised to follow-up with [facility] as scheduled for routine prenatal care Patient advised to follow-up with Dr. Ronelle Nigh for psychiatry as scheduled or sooner PRN Patient may return to MAU as needed or if her condition were to change or worsen   Luvenia Redden, PA-C  11/06/2015, 4:50 PM

## 2015-12-03 ENCOUNTER — Encounter (HOSPITAL_COMMUNITY): Payer: Self-pay | Admitting: *Deleted

## 2015-12-03 ENCOUNTER — Inpatient Hospital Stay (HOSPITAL_COMMUNITY)
Admission: AD | Admit: 2015-12-03 | Discharge: 2015-12-03 | Disposition: A | Discharge status: Home / Self Care | Payer: Medicare Other | Source: Ambulatory Visit | Attending: Obstetrics and Gynecology | Admitting: Obstetrics and Gynecology

## 2015-12-03 DIAGNOSIS — R102 Pelvic and perineal pain: Secondary | ICD-10-CM | POA: Diagnosis not present

## 2015-12-03 DIAGNOSIS — B3731 Acute candidiasis of vulva and vagina: Secondary | ICD-10-CM

## 2015-12-03 DIAGNOSIS — B373 Candidiasis of vulva and vagina: Secondary | ICD-10-CM | POA: Insufficient documentation

## 2015-12-03 DIAGNOSIS — O98813 Other maternal infectious and parasitic diseases complicating pregnancy, third trimester: Secondary | ICD-10-CM | POA: Diagnosis not present

## 2015-12-03 DIAGNOSIS — N949 Unspecified condition associated with female genital organs and menstrual cycle: Secondary | ICD-10-CM

## 2015-12-03 DIAGNOSIS — Z3A28 28 weeks gestation of pregnancy: Secondary | ICD-10-CM | POA: Diagnosis not present

## 2015-12-03 DIAGNOSIS — A630 Anogenital (venereal) warts: Secondary | ICD-10-CM | POA: Insufficient documentation

## 2015-12-03 DIAGNOSIS — O98313 Other infections with a predominantly sexual mode of transmission complicating pregnancy, third trimester: Secondary | ICD-10-CM | POA: Insufficient documentation

## 2015-12-03 DIAGNOSIS — O2690 Pregnancy related conditions, unspecified, unspecified trimester: Secondary | ICD-10-CM | POA: Diagnosis not present

## 2015-12-03 DIAGNOSIS — R109 Unspecified abdominal pain: Secondary | ICD-10-CM | POA: Diagnosis present

## 2015-12-03 LAB — WET PREP, GENITAL
Clue Cells Wet Prep HPF POC: NONE SEEN
Sperm: NONE SEEN
Trich, Wet Prep: NONE SEEN
Yeast Wet Prep HPF POC: NONE SEEN

## 2015-12-03 LAB — URINALYSIS, ROUTINE W REFLEX MICROSCOPIC
Bilirubin Urine: NEGATIVE
Glucose, UA: NEGATIVE mg/dL
Hgb urine dipstick: NEGATIVE
Ketones, ur: NEGATIVE mg/dL
Leukocytes, UA: NEGATIVE
Nitrite: NEGATIVE
Protein, ur: NEGATIVE mg/dL
Specific Gravity, Urine: 1.02 (ref 1.005–1.030)
pH: 6 (ref 5.0–8.0)

## 2015-12-03 MED ORDER — TERCONAZOLE 0.4 % VA CREA: 1.0000 | TOPICAL_CREAM | Freq: Every day | VAGINAL | Status: DC

## 2015-12-03 NOTE — Discharge Instructions (Signed)
Genital Warts Genital warts are small growths in the genital area or anal area. They are caused by a type of germ (HPV virus). This germ is spread from person to person during sex. It can be spread through vaginal, anal, and oral sex. Genital warts can lead to other problems if they are not treated. HOME CARE Medicines  Apply over-the-counter and prescription medicines only as told by your doctor.  Do not use medicines that are meant for treating hand warts.  Talk with your doctor about using anti-itch creams. General Instructions  Do not touch or scratch the warts.  Do not have sex until your treatment is done.  Tell your current and past sexual partners about your condition. They may need treatment.  Keep all follow-up visits as told by your doctor. This is important.  After treatment, use condoms during sex. Other Instructions for Women  Women who have genital warts may need to be checked more often for cervical cancer.  If you become pregnant, tell your doctor that you have had genital warts. The germ can be passed to the baby. GET HELP IF:  You have redness, swelling, or pain in the area of the treated skin.  You have a fever.  You feel generally sick.  You feel lumps in and around your genital area or anal area.  You have bleeding in your genital area or anal area.  You have pain during sex.   This information is not intended to replace advice given to you by your health care provider. Make sure you discuss any questions you have with your health care provider.   Document Released: 02/25/2010 Document Revised: 08/22/2015 Document Reviewed: 02/26/2015 Elsevier Interactive Patient Education 2016 Elsevier Inc. Monilial Vaginitis Vaginitis in a soreness, swelling and redness (inflammation) of the vagina and vulva. Monilial vaginitis is not a sexually transmitted infection. CAUSES  Yeast vaginitis is caused by yeast (candida) that is normally found in your vagina.  With a yeast infection, the candida has overgrown in number to a point that upsets the chemical balance. SYMPTOMS   White, thick vaginal discharge.  Swelling, itching, redness and irritation of the vagina and possibly the lips of the vagina (vulva).  Burning or painful urination.  Painful intercourse. DIAGNOSIS  Things that may contribute to monilial vaginitis are:  Postmenopausal and virginal states.  Pregnancy.  Infections.  Being tired, sick or stressed, especially if you had monilial vaginitis in the past.  Diabetes. Good control will help lower the chance.  Birth control pills.  Tight fitting garments.  Using bubble bath, feminine sprays, douches or deodorant tampons.  Taking certain medications that kill germs (antibiotics).  Sporadic recurrence can occur if you become ill. TREATMENT  Your caregiver will give you medication.  There are several kinds of anti monilial vaginal creams and suppositories specific for monilial vaginitis. For recurrent yeast infections, use a suppository or cream in the vagina 2 times a week, or as directed.  Anti-monilial or steroid cream for the itching or irritation of the vulva may also be used. Get your caregiver's permission.  Painting the vagina with methylene blue solution may help if the monilial cream does not work.  Eating yogurt may help prevent monilial vaginitis. HOME CARE INSTRUCTIONS   Finish all medication as prescribed.  Do not have sex until treatment is completed or after your caregiver tells you it is okay.  Take warm sitz baths.  Do not douche.  Do not use tampons, especially scented ones.  Wear cotton  underwear.  Avoid tight pants and panty hose.  Tell your sexual partner that you have a yeast infection. They should go to their caregiver if they have symptoms such as mild rash or itching.  Your sexual partner should be treated as well if your infection is difficult to eliminate.  Practice safer sex.  Use condoms.  Some vaginal medications cause latex condoms to fail. Vaginal medications that harm condoms are:  Cleocin cream.  Butoconazole (Femstat).  Terconazole (Terazol) vaginal suppository.  Miconazole (Monistat) (may be purchased over the counter). SEEK MEDICAL CARE IF:   You have a temperature by mouth above 102 F (38.9 C).  The infection is getting worse after 2 days of treatment.  The infection is not getting better after 3 days of treatment.  You develop blisters in or around your vagina.  You develop vaginal bleeding, and it is not your menstrual period.  You have pain when you urinate.  You develop intestinal problems.  You have pain with sexual intercourse.   This information is not intended to replace advice given to you by your health care provider. Make sure you discuss any questions you have with your health care provider.   Document Released: 09/10/2005 Document Revised: 02/23/2012 Document Reviewed: 06/04/2015 Elsevier Interactive Patient Education [employer].

## 2015-12-03 NOTE — MAU Provider Note (Signed)
History     CSN: [id number]  Arrival date and time: 12/03/15 1203   First Provider Initiated Contact with Patient 12/03/15 1242      Chief Complaint  Patient presents with  . Abdominal Pain   HPI    [name] is a 29 y.o. female G1P0 at [redacted]w[redacted]d presenting to MAU via EMS with stomach pains. The patient has multiple visits to the MAU. The pain started a few days ago and has gotten worse. The pain is located in the lower part of her stomach; "in the middle and on both sides."  The pain worsens when she gets up and when she changes position.  Patient is not wearing a pregnancy support belt and asked appropriate questions on where she can purchase one.   Denies vaginal bleeding, Denies leaking of fluid.   + vaginal itching which has worsened over the last few days. The itching is near the inside of the vagina and the outside.   OB History    Gravida Para Term Preterm AB TAB SAB Ectopic Multiple Living   1 0              Past Medical History  Diagnosis Date  . Bipolar disorder ([place])   . Attention deficit hyperactivity disorder   . Depression   . Anxiety   . Asthma   . UTI (urinary tract infection)     Completed Cipro 08/06/12  . Gastritis     Past Surgical History  Procedure Laterality Date  . Mouth surgery    . Eye muscle surgery Bilateral     07/29/12  . Plantar fascia surgery      Family History  Problem Relation Age of Onset  . Depression Mother   . Depression Father   . Depression Brother   . Colon cancer Mother   . Colon polyps Mother   . Liver disease    . Kidney disease      Social History  Substance Use Topics  . Smoking status: Never Smoker   . Smokeless tobacco: Never Used  . Alcohol Use: No    Allergies:  Allergies  Allergen Reactions  . Depakote [Divalproex Sodium] Other (See Comments)    Pt stated that her blood level was toxic when she had this medication.    . Methylphenidate Derivatives Other (See Comments)    Reaction:   Depression and anger   . Neurontin [Gabapentin] Other (See Comments)    Reaction:  Dizziness   . Prozac [Fluoxetine Hcl] Other (See Comments)    Reaction:  Anger     Prescriptions prior to admission  Medication Sig Dispense Refill Last Dose  . albuterol (PROVENTIL HFA;VENTOLIN HFA) 108 (90 BASE) MCG/ACT inhaler Inhale 2 puffs into the lungs every 6 (six) hours as needed for wheezing or shortness of breath.   resscue  . citalopram (CELEXA) 40 MG tablet Take 1 tablet (40 mg total) by mouth every morning. For depression (Patient taking differently: Take 40 mg by mouth daily. ) 30 tablet 0 11/06/2015 at Unknown time  . hydrOXYzine (VISTARIL) 25 MG capsule Take 25 mg by mouth 2 (two) times daily.    11/06/2015 at Unknown time  . OLANZapine (ZYPREXA) 10 MG tablet Take 10 mg by mouth at bedtime.   11/05/2015 at Unknown time  . Prenatal Vit-Fe Fumarate-FA (PRENATAL MULTIVITAMIN) TABS tablet Take 2 tablets by mouth daily at 12 noon.    11/06/2015 at Unknown time  . traZODone (DESYREL) 150 MG tablet Take 1  tablet (150 mg total) by mouth at bedtime. 30 tablet 0 11/05/2015 at Unknown time   Results for orders placed or performed during the hospital encounter of 12/03/15 (from the past 48 hour(s))  Wet prep, genital     Status: Abnormal   Collection Time: 12/03/15 12:57 PM  Result Value Ref Range   Yeast Wet Prep HPF POC NONE SEEN NONE SEEN   Trich, Wet Prep NONE SEEN NONE SEEN   Clue Cells Wet Prep HPF POC NONE SEEN NONE SEEN   WBC, Wet Prep HPF POC FEW (A) NONE SEEN    Comment: FEW BACTERIA SEEN   Sperm NONE SEEN   Urinalysis, Routine w reflex microscopic (not at [facility])     Status: None   Collection Time: 12/03/15  1:09 PM  Result Value Ref Range   Color, Urine YELLOW YELLOW   APPearance CLEAR CLEAR   Specific Gravity, Urine 1.020 1.005 - 1.030   pH 6.0 5.0 - 8.0   Glucose, UA NEGATIVE NEGATIVE mg/dL   Hgb urine dipstick NEGATIVE NEGATIVE   Bilirubin Urine NEGATIVE NEGATIVE   Ketones, ur  NEGATIVE NEGATIVE mg/dL   Protein, ur NEGATIVE NEGATIVE mg/dL   Nitrite NEGATIVE NEGATIVE   Leukocytes, UA NEGATIVE NEGATIVE    Comment: MICROSCOPIC NOT DONE ON URINES WITH NEGATIVE PROTEIN, BLOOD, LEUKOCYTES, NITRITE, OR GLUCOSE <1000 mg/dL.    Review of Systems  Constitutional: Negative for fever and chills.  Gastrointestinal: Positive for abdominal pain. Negative for heartburn, nausea, vomiting, diarrhea and constipation.  Genitourinary: Positive for dysuria. Negative for urgency, frequency, hematuria and flank pain.   Physical Exam   Blood pressure 104/67, pulse 94, temperature 98.3 F (36.8 C), temperature source Oral, resp. rate 16, height 5\' 6"  (1.676 m), weight 240 lb (108.863 kg), last menstrual period 05/12/2015.  Physical Exam  Constitutional: She is oriented to person, place, and time. She appears well-developed and well-nourished. No distress.  Un-kept appearance   HENT:  Head: Normocephalic.  Eyes: Pupils are equal, round, and reactive to light.  GI: Soft.  Genitourinary:  Cervix: closed, thick, posterior  Wet prep collected without speculum  Patchy white discharge noted near introitus and labia minora.  External genitalia with condyloma  Musculoskeletal: Normal range of motion.  Neurological: She is alert and oriented to person, place, and time.  Skin: Skin is warm. She is not diaphoretic.  Psychiatric: Her speech is delayed. She is slowed.   Fetal Tracing: Baseline: 120 bpm  Variability: Moderate Accelerations: 15x15 Decelerations: quick variables  Toco: None Difficult tracing due to patients inability to lay in the bed for NST  MAU Course  Procedures  None  MDM  Patient requesting food multiple times Patient requesting to leave AMA   HR noted on arrival as 130's,  at the time of discharge RN states she did not get a set of vitals before the patient left. Per fetal tracing maternal HR in the 90's at the time of discharge.    Assessment and Plan    A:  1. Round ligament pain   2. Vaginal venereal warts   3. Yeast vaginitis     P:  Discharge home in stable condition  RX: Terazol 7 day Patient encouraged to purchase a pregnancy support belt.  Return to MAU if symptoms worsen  Follow up with OB as scheduled     Lezlie Lye, NP 12/03/2015 12:51 PM

## 2015-12-03 NOTE — MAU Note (Signed)
Patient presents via EMS stretcher at [redacted] weeks gestation with c/o abdominal pain X couple of days that worsened today. States she wants to see the doctor about delivering the baby now as she is in so much pain. Fetus active. Denies bleeding but has discharge.

## 2015-12-16 NOTE — L&D Delivery Note (Signed)
Delivery Note At 4:00 AM a viable and healthy female was delivered via Vaginal, Spontaneous Delivery (Presentation: Left Occiput Anterior).  APGAR: 9, 9; weight P .   Placenta status: Intact, Spontaneous.  Cord: 3 vessels with the following complications: Nuchal.   Anesthesia: Epidural  Episiotomy: None Lacerations: 1st degree;Labial Suture Repair: 3.0 vicryl rapide Est. Blood Loss (mL): 400cc  Mom to postpartum.  Baby to Couplet care / Skin to Skin.  Bovard-Stuckert, [name] 02/11/2016, 4:43 AM  Bo/no Tdap in PNC/RI/O+/Contra?

## 2015-12-28 ENCOUNTER — Encounter (HOSPITAL_COMMUNITY): Payer: Self-pay | Admitting: *Deleted

## 2015-12-28 ENCOUNTER — Inpatient Hospital Stay (HOSPITAL_COMMUNITY)
Admission: AD | Admit: 2015-12-28 | Discharge: 2015-12-28 | Disposition: A | Discharge status: Home / Self Care | Payer: Medicare Other | Source: Ambulatory Visit | Attending: Obstetrics and Gynecology | Admitting: Obstetrics and Gynecology

## 2015-12-28 DIAGNOSIS — F319 Bipolar disorder, unspecified: Secondary | ICD-10-CM | POA: Diagnosis not present

## 2015-12-28 DIAGNOSIS — O4703 False labor before 37 completed weeks of gestation, third trimester: Secondary | ICD-10-CM

## 2015-12-28 DIAGNOSIS — Z3A31 31 weeks gestation of pregnancy: Secondary | ICD-10-CM | POA: Diagnosis not present

## 2015-12-28 DIAGNOSIS — F909 Attention-deficit hyperactivity disorder, unspecified type: Secondary | ICD-10-CM | POA: Insufficient documentation

## 2015-12-28 DIAGNOSIS — O26893 Other specified pregnancy related conditions, third trimester: Secondary | ICD-10-CM | POA: Diagnosis not present

## 2015-12-28 DIAGNOSIS — J45909 Unspecified asthma, uncomplicated: Secondary | ICD-10-CM | POA: Diagnosis not present

## 2015-12-28 DIAGNOSIS — N898 Other specified noninflammatory disorders of vagina: Secondary | ICD-10-CM | POA: Diagnosis not present

## 2015-12-28 LAB — AMNISURE RUPTURE OF MEMBRANE (ROM) NOT AT ARMC: Amnisure ROM: NEGATIVE

## 2015-12-28 LAB — POCT FERN TEST: POCT Fern Test: NEGATIVE

## 2015-12-28 NOTE — Progress Notes (Signed)
CSW received request for consult due to transportation needs.  Patient and her significant other remember CSW from previous encounters. CSW did not complete full assessment since CSW met with patient in the MAU on 09/20/15 and 11/06/15.    CSW inquired about events that led to her visit to the MAU.  Patient stated that she was concerned about discharge and blood, and reported that she was concerned about being in labor.  Patient shared that she is actually unsure of signs of labor, and reported that since she was concerned, she contacted her OB's office, who recommended that she visit the MAU.  Patient stated that she is nervous about returning home in the event that she goes into labor.  CSW notes that patient has numerous visits to the MAU during this pregnancy, often for vague events.  CSW consulted with CNM in order to discuss potential increase in concrete education regarding signs and symptoms of need to return to the MAU and indicators of early labor.  CNM agreed that this may be helpful.  Patient expressed acute fear of her DSS involvement at infant's birth. She stated that she is concerned since she is diagnosed with bipolar and has no transportation.  CSW explored with patient factors that contribute to DSS involvement at [facility]. Patient presented with ability to problem solve and shared that she and the [name] are working toward moving closer to town in order to increase access to transportation and the [employer] office.  She reported that they have almost obtained all baby items.   Per patient, she continues to work with her ACTT team, and is going to be re-starting additional medications postpartum. She shared that she will not breastfeed if medications are not safe while breastfeeding in order to support her mental health.  Patient expressed belief that she will ensure that the infant is safe once it is born.   Patient expressed need for transportation help to get home. She stated that she has  contacted her sister numerous times, and has been unsuccessful in her attempts to reach her. Per patient, her mother is in a nursing home, and her uncle is at a doctor's appointment and is not feeling well.  Patient reported that her ACTT team is unable to pick her and the [name] up since they can only transport her due to insurance/liability.  She stated that she usually uses Medicaid transportation, and reported that she does not live on the bus line.  Patient and [name] unable to identify additional options for transportation.  CSW encouraged patient and [name] to continue to explore options, and stated that CSW will return to receive update on their progress.

## 2015-12-28 NOTE — Discharge Instructions (Signed)

## 2015-12-28 NOTE — MAU Provider Note (Signed)
Chief Complaint:  Rupture of Membranes and Contractions   First Provider Initiated Contact with Patient 12/28/15 1334      HPI: [name] is a 30 y.o. G1P0 at [redacted]w[redacted]d who presents by EMS to maternity admissions reporting abdominal cramping x 3 weeks and leaking of some mucus, pink spotting, and clear discharge x 1 episode today. Last had intercourse yesterday.  She also reports some clear leakage off and on x several weeks and pink spotting after intercourse.   She reports good fetal movement, denies vaginal itching/burning, urinary symptoms, h/a, dizziness, n/v, or fever/chills.    Vaginal Discharge The patient's primary symptoms include pelvic pain and vaginal discharge. This is a recurrent problem. The current episode started 1 to 4 weeks ago. The problem occurs intermittently. The problem has been waxing and waning. The pain is moderate. The problem affects both sides. She is pregnant. Associated symptoms include abdominal pain and back pain. Pertinent negatives include no chills, dysuria, fever, flank pain, frequency, headaches, nausea or vomiting. The vaginal discharge was clear, mucoid and thin. The vaginal bleeding is spotting. She has not been passing clots. She has not been passing tissue. The symptoms are aggravated by intercourse. She has tried nothing for the symptoms.    Past Medical History: Past Medical History  Diagnosis Date  . Bipolar disorder ([place])   . Attention deficit hyperactivity disorder   . Depression   . Anxiety   . Asthma   . UTI (urinary tract infection)     Completed Cipro 08/06/12  . Gastritis     Past obstetric history: OB History  Gravida Para Term Preterm AB SAB TAB Ectopic Multiple Living  1 0            # Outcome Date GA Lbr Len/2nd Weight Sex Delivery Anes PTL Lv  1 Current               Past Surgical History: Past Surgical History  Procedure Laterality Date  . Mouth surgery    . Eye muscle surgery Bilateral     07/29/12  . Plantar  fascia surgery      Family History: Family History  Problem Relation Age of Onset  . Depression Mother   . Depression Father   . Depression Brother   . Colon cancer Mother   . Colon polyps Mother   . Liver disease    . Kidney disease      Social History: Social History  Substance Use Topics  . Smoking status: Never Smoker   . Smokeless tobacco: Never Used  . Alcohol Use: No    Allergies:  Allergies  Allergen Reactions  . Depakote [Divalproex Sodium] Other (See Comments)    Pt stated that her blood level was toxic when she had this medication.    . Methylphenidate Derivatives Other (See Comments)    Reaction:  Depression and anger   . Neurontin [Gabapentin] Other (See Comments)    Reaction:  Dizziness   . Prozac [Fluoxetine Hcl] Other (See Comments)    Reaction:  Anger     Meds:  Prescriptions prior to admission  Medication Sig Dispense Refill Last Dose  . acetaminophen (TYLENOL) 500 MG tablet Take 1,000 mg by mouth every 6 (six) hours as needed for mild pain or headache.   12/27/2015 at Unknown time  . Prenatal Vit-Fe Fumarate-FA (PRENATAL MULTIVITAMIN) TABS tablet Take 1 tablet by mouth daily at 12 noon.   12/28/2015 at Unknown time  . citalopram (CELEXA) 40 MG tablet  Take 1 tablet (40 mg total) by mouth every morning. For depression (Patient not taking: Reported on 12/03/2015) 30 tablet 0   . terconazole (TERAZOL 7) 0.4 % vaginal cream Place 1 applicator vaginally at bedtime. (Patient not taking: Reported on 12/28/2015) 45 g 0 Not Taking at Unknown time  . traZODone (DESYREL) 150 MG tablet Take 1 tablet (150 mg total) by mouth at bedtime. (Patient not taking: Reported on 12/03/2015) 30 tablet 0     ROS:  Review of Systems  Constitutional: Negative for fever, chills and fatigue.  Respiratory: Negative for shortness of breath.   Cardiovascular: Negative for chest pain.  Gastrointestinal: Positive for abdominal pain. Negative for nausea and vomiting.  Genitourinary:  Positive for vaginal discharge and pelvic pain. Negative for dysuria, frequency, flank pain, difficulty urinating and vaginal pain.  Musculoskeletal: Positive for back pain.  Neurological: Negative for dizziness and headaches.  Psychiatric/Behavioral: Negative.      I have reviewed patient's Past Medical Hx, Surgical Hx, Family Hx, Social Hx, medications and allergies.   Physical Exam   Patient Vitals for the past 24 hrs:  BP Temp Temp src Pulse Resp Height Weight  12/28/15 1304 114/88 mmHg - - 115 - - -  12/28/15 1301 - - - - - 5\' 7"  (1.702 m) 242 lb (109.77 kg)  12/28/15 1259 142/92 mmHg 98.1 F (36.7 C) Oral - 18 - -   Constitutional: Well-developed, well-nourished female in no acute distress.  Cardiovascular: normal rate Respiratory: normal effort GI: Abd soft, non-tender, gravid appropriate for gestational age.  MS: Extremities nontender, no edema, normal ROM Neurologic: Alert and oriented x 4.  GU: Neg CVAT.  PELVIC EXAM: Cervix pink, visually closed, without lesion, scant white creamy discharge, no blood visualized, vaginal walls and external genitalia normal  Dilation: Closed Effacement (%): Thick Cervical Position: Middle Exam by:: AThurman Coyer, RN  FHT:  Baseline 135 , moderate variability, accelerations present, no decelerations Contractions: none on toco or to palpation   Labs: Results for orders placed or performed during the hospital encounter of 12/28/15 (from the past 24 hour(s))  Fern Test     Status: None   Collection Time: 12/28/15  1:18 PM  Result Value Ref Range   POCT Fern Test Negative = intact amniotic membranes   Amnisure rupture of membrane (rom)not at [facility]     Status: None   Collection Time: 12/28/15  1:35 PM  Result Value Ref Range   Amnisure ROM NEGATIVE       Imaging:  No results found.  MAU Course/MDM: I have ordered labs and reviewed results.  With closed cervix, no contractions on Toco, and negative amnisure, unlikely preterm  labor.  No evidence of bleeding on exam.  Consult Dr Terri Piedra.  Pt given meal while in MAU and SW to bedside to assist pt with ride home as she came in by EMS and reports she has no way to get home. Pt stable at time of discharge.  Assessment: 1. Vaginal discharge during pregnancy in third trimester   2. Threatened preterm labor, third trimester     Plan: Discharge home Preterm labor precautions and fetal kick counts F/U in office as scheduled Return to MAU as needed for emergencies     Follow-up Information    Follow up with [facility], DO.   Specialty:  Obstetrics and Gynecology   Why:  As scheduled, Return to MAU as needed for emergencies   Contact information:   [address] [place] [address] [phone]        Medication List    STOP taking these medications        citalopram 40 MG tablet  Commonly known as:  CELEXA     traZODone 150 MG tablet  Commonly known as:  DESYREL      TAKE these medications        acetaminophen 500 MG tablet  Commonly known as:  TYLENOL  Take 1,000 mg by mouth every 6 (six) hours as needed for mild pain or headache.     prenatal multivitamin Tabs tablet  Take 1 tablet by mouth daily at 12 noon.     terconazole 0.4 % vaginal cream  Commonly known as:  TERAZOL 7  Place 1 applicator vaginally at bedtime.        Fatima Blank Certified Nurse-Midwife 12/28/2015 3:10 PM

## 2015-12-28 NOTE — MAU Note (Signed)
Pt reports feeling contractions for 5 days but today they increased to 3 minutes apart. Pt has noted some spotting today. Pt felt like her water broke at noon. Pt states her mucous plug came out and some clear fluid at that time.

## 2016-01-01 DIAGNOSIS — Z36 Encounter for antenatal screening of mother: Secondary | ICD-10-CM | POA: Diagnosis not present

## 2016-01-06 ENCOUNTER — Encounter (HOSPITAL_COMMUNITY): Payer: Self-pay | Admitting: *Deleted

## 2016-01-06 ENCOUNTER — Inpatient Hospital Stay (HOSPITAL_COMMUNITY)
Admission: AD | Admit: 2016-01-06 | Discharge: 2016-01-06 | Disposition: A | Discharge status: Home / Self Care | Payer: Medicare Other | Source: Ambulatory Visit | Attending: Obstetrics and Gynecology | Admitting: Obstetrics and Gynecology

## 2016-01-06 DIAGNOSIS — R55 Syncope and collapse: Secondary | ICD-10-CM | POA: Diagnosis present

## 2016-01-06 DIAGNOSIS — O212 Late vomiting of pregnancy: Secondary | ICD-10-CM | POA: Diagnosis not present

## 2016-01-06 DIAGNOSIS — O219 Vomiting of pregnancy, unspecified: Secondary | ICD-10-CM

## 2016-01-06 DIAGNOSIS — Z3A33 33 weeks gestation of pregnancy: Secondary | ICD-10-CM | POA: Insufficient documentation

## 2016-01-06 DIAGNOSIS — R079 Chest pain, unspecified: Secondary | ICD-10-CM | POA: Diagnosis not present

## 2016-01-06 LAB — URINALYSIS, ROUTINE W REFLEX MICROSCOPIC
Glucose, UA: NEGATIVE mg/dL
Hgb urine dipstick: NEGATIVE
Ketones, ur: >80 mg/dL — AB
Nitrite: NEGATIVE
Protein, ur: 100 mg/dL — AB
Specific Gravity, Urine: >1.03 — ABNORMAL HIGH (ref 1.005–1.030)
pH: 6 (ref 5.0–8.0)

## 2016-01-06 LAB — URINE MICROSCOPIC-ADD ON

## 2016-01-06 MED ORDER — PROMETHAZINE HCL 25 MG/ML IJ SOLN
25.0000 mg | INTRAMUSCULAR | Status: DC
  Filled 2016-01-06: qty 1

## 2016-01-06 MED ORDER — LACTATED RINGERS IV BOLUS (SEPSIS)
1000.0000 mL | Freq: Once | INTRAVENOUS | Status: AC
  Administered 2016-01-06: 1000 mL via INTRAVENOUS

## 2016-01-06 MED ORDER — PROMETHAZINE HCL 12.5 MG PO TABS: 12.5000 mg | ORAL_TABLET | Freq: Four times a day (QID) | ORAL | Status: DC | PRN

## 2016-01-06 MED ORDER — DEXTROSE 5 % IN LACTATED RINGERS IV BOLUS
1000.0000 mL | Freq: Once | INTRAVENOUS | Status: AC
  Administered 2016-01-06: 1000 mL via INTRAVENOUS

## 2016-01-06 NOTE — MAU Note (Signed)
Pt states she felt faint like she was going to pass out.  Pt states she is having back pain, nausea, throwing up, and diarrhea.  Pt states she felt like she was having chest pains last night but it was indigestion.  Pt states the baby is not moving as much as normal.

## 2016-01-06 NOTE — MAU Provider Note (Signed)
History     CSN: [id number]  Arrival date and time: 01/06/16 1744   First Provider Initiated Contact with Patient 01/06/16 1837      Chief Complaint  Patient presents with  . Near Syncope   HPI [name] 30 y.o. G1P0 @[redacted]w[redacted]d  presents to MAU feeling dehydrating, with weakness, nausea, vomiting, diarrhea.  She has vomited 3 times and had diarrhea 5-6 times.  The diarrhea is dark brown and runny.  She ate a sausage biscuit this morning.  She drinks water and it comes back up.  Same with [facility].  She used phenergan tab this am but vomited it back up.  She does not have anymore.  She feels decreased fetal movement.  She has been having contractions 3 minutes apart for one week.  She had some vaginal bleeding last night that soaked a panty liner but no bleeding today.  She has had leaking of clear fluid 2 days ago with some mucus.  Severe back pain, indigestion.   OB History    Gravida Para Term Preterm AB TAB SAB Ectopic Multiple Living   1 0              Past Medical History  Diagnosis Date  . Bipolar disorder ([place])   . Attention deficit hyperactivity disorder   . Depression   . Anxiety   . Asthma   . UTI (urinary tract infection)     Completed Cipro 08/06/12  . Gastritis     Past Surgical History  Procedure Laterality Date  . Mouth surgery    . Eye muscle surgery Bilateral     07/29/12  . Plantar fascia surgery      Family History  Problem Relation Age of Onset  . Depression Mother   . Depression Father   . Depression Brother   . Colon cancer Mother   . Colon polyps Mother   . Liver disease    . Kidney disease      Social History  Substance Use Topics  . Smoking status: Never Smoker   . Smokeless tobacco: Never Used  . Alcohol Use: No    Allergies:  Allergies  Allergen Reactions  . Depakote [Divalproex Sodium] Other (See Comments)    Pt stated that her blood level was toxic when she had this medication.    . Methylphenidate Derivatives Other  (See Comments)    Reaction:  Depression and anger   . Neurontin [Gabapentin] Other (See Comments)    Reaction:  Dizziness   . Prozac [Fluoxetine Hcl] Other (See Comments)    Reaction:  Anger     Prescriptions prior to admission  Medication Sig Dispense Refill Last Dose  . acetaminophen (TYLENOL) 500 MG tablet Take 1,000 mg by mouth every 6 (six) hours as needed for mild pain or headache.   12/27/2015 at Unknown time  . Prenatal Vit-Fe Fumarate-FA (PRENATAL MULTIVITAMIN) TABS tablet Take 1 tablet by mouth daily at 12 noon.   12/28/2015 at Unknown time  . terconazole (TERAZOL 7) 0.4 % vaginal cream Place 1 applicator vaginally at bedtime. (Patient not taking: Reported on 12/28/2015) 45 g 0 Not Taking at Unknown time    ROS Pertinent ROS in HPI.  All other systems are negative.   Physical Exam   Blood pressure 117/75, pulse 96, temperature 98.8 F (37.1 C), temperature source Oral, resp. rate 18, last menstrual period 05/12/2015, SpO2 96 %.  Physical Exam  Constitutional: She is oriented to person, place, and time. She appears  well-developed and well-nourished. No distress.  HENT:  Head: Normocephalic and atraumatic.  Eyes: Conjunctivae and EOM are normal.  Neck: Normal range of motion. Neck supple.  Cardiovascular: Normal rate.   Respiratory: Breath sounds normal. No respiratory distress.  GI: Soft. Bowel sounds are normal. She exhibits no distension.  Genitourinary:  Cervix closed.  No vaginal bleeding.  Musculoskeletal: Normal range of motion. She exhibits no edema.  Neurological: She is alert and oriented to person, place, and time.  Skin: Skin is warm and dry.  Psychiatric: She has a normal mood and affect. Her behavior is normal.   FHR 120's, +accels MAU Course  Procedures  MDM IV D5LR with Phenergan ordered.   Pt requesting to order food prior to IV and phenergan.   Dr. Marvel Plan called.  She is agreeable to pt receiving IV fluids and phenergan.  If no complications,  may discharge to home Pt declines phenergan.   Pt has ordered 2 hot dogs, french fries and corn along with Coke.  Also demanding many crackers and sprite while awaiting cafe food.   Report given and care turned over to [facility], [place].    Haynes Kerns Clark 01/06/2016, 6:37 PM   2105 Pt able to eat hogs and french fries without difficulty.  No emesis after eating. Assessment and Plan  30 y.o. G1P0 at [redacted]w[redacted]d IUP Nausea and Vomiting in Pregnancy  Plan: Discharge home Continue meds Follow-up in office as scheduled  [place], CNM

## 2016-01-14 DIAGNOSIS — O368130 Decreased fetal movements, third trimester, not applicable or unspecified: Secondary | ICD-10-CM | POA: Diagnosis not present

## 2016-01-14 DIAGNOSIS — Z3A34 34 weeks gestation of pregnancy: Secondary | ICD-10-CM | POA: Diagnosis not present

## 2016-01-15 ENCOUNTER — Encounter (HOSPITAL_COMMUNITY): Payer: Self-pay | Admitting: Emergency Medicine

## 2016-01-15 ENCOUNTER — Emergency Department (HOSPITAL_COMMUNITY)
Admission: EM | Admit: 2016-01-15 | Discharge: 2016-01-15 | Disposition: A | Discharge status: Home / Self Care | Payer: Medicare Other | Attending: Emergency Medicine | Admitting: Emergency Medicine

## 2016-01-15 DIAGNOSIS — J45909 Unspecified asthma, uncomplicated: Secondary | ICD-10-CM | POA: Diagnosis not present

## 2016-01-15 DIAGNOSIS — R030 Elevated blood-pressure reading, without diagnosis of hypertension: Secondary | ICD-10-CM | POA: Diagnosis not present

## 2016-01-15 DIAGNOSIS — M545 Low back pain: Secondary | ICD-10-CM | POA: Insufficient documentation

## 2016-01-15 DIAGNOSIS — O9989 Other specified diseases and conditions complicating pregnancy, childbirth and the puerperium: Secondary | ICD-10-CM | POA: Insufficient documentation

## 2016-01-15 DIAGNOSIS — Z3A33 33 weeks gestation of pregnancy: Secondary | ICD-10-CM | POA: Insufficient documentation

## 2016-01-15 DIAGNOSIS — M549 Dorsalgia, unspecified: Secondary | ICD-10-CM | POA: Diagnosis not present

## 2016-01-15 NOTE — ED Notes (Signed)
Patient c/o mid lumbar pain, non radiating, 6/10. No injury to same. 19 wee pregnant, no abdominal pain, no urinary symptoms, no numbness or tingling. Ambulatory in triage with steady gait.

## 2016-01-15 NOTE — ED Notes (Signed)
Pt continues asking repeatedly to go see her boyfriend who is currently roomed in the main ED.  This RN attempted to explain numerous times that she needs to remain in this area for evaluation and treatment, but she refuses to do so.

## 2016-01-15 NOTE — ED Notes (Signed)
Patient presents from home for lumbar pain x1 day, worsening over the course of the day. [redacted] weeks pregnant.   Last VS: 158/110, 106hr, 18resp, 98% ra

## 2016-01-16 ENCOUNTER — Encounter: Payer: Medicare Other | Admitting: Internal Medicine

## 2016-01-16 ENCOUNTER — Encounter: Payer: Self-pay | Admitting: Internal Medicine

## 2016-01-21 ENCOUNTER — Encounter (HOSPITAL_COMMUNITY): Payer: Self-pay | Admitting: *Deleted

## 2016-01-21 ENCOUNTER — Inpatient Hospital Stay (HOSPITAL_COMMUNITY)
Admission: AD | Admit: 2016-01-21 | Discharge: 2016-01-21 | Disposition: A | Discharge status: Home / Self Care | Payer: Medicare Other | Source: Ambulatory Visit | Attending: Obstetrics and Gynecology | Admitting: Obstetrics and Gynecology

## 2016-01-21 DIAGNOSIS — F319 Bipolar disorder, unspecified: Secondary | ICD-10-CM | POA: Diagnosis not present

## 2016-01-21 DIAGNOSIS — O9989 Other specified diseases and conditions complicating pregnancy, childbirth and the puerperium: Secondary | ICD-10-CM

## 2016-01-21 DIAGNOSIS — O2690 Pregnancy related conditions, unspecified, unspecified trimester: Secondary | ICD-10-CM | POA: Diagnosis not present

## 2016-01-21 DIAGNOSIS — R109 Unspecified abdominal pain: Secondary | ICD-10-CM | POA: Diagnosis not present

## 2016-01-21 DIAGNOSIS — O26893 Other specified pregnancy related conditions, third trimester: Secondary | ICD-10-CM | POA: Insufficient documentation

## 2016-01-21 DIAGNOSIS — Z3A35 35 weeks gestation of pregnancy: Secondary | ICD-10-CM | POA: Insufficient documentation

## 2016-01-21 DIAGNOSIS — J45909 Unspecified asthma, uncomplicated: Secondary | ICD-10-CM | POA: Diagnosis not present

## 2016-01-21 DIAGNOSIS — N949 Unspecified condition associated with female genital organs and menstrual cycle: Secondary | ICD-10-CM | POA: Diagnosis not present

## 2016-01-21 DIAGNOSIS — F909 Attention-deficit hyperactivity disorder, unspecified type: Secondary | ICD-10-CM | POA: Diagnosis not present

## 2016-01-21 LAB — URINALYSIS, ROUTINE W REFLEX MICROSCOPIC
Bilirubin Urine: NEGATIVE
Glucose, UA: NEGATIVE mg/dL
Hgb urine dipstick: NEGATIVE
Ketones, ur: 15 mg/dL — AB
Leukocytes, UA: NEGATIVE
Nitrite: NEGATIVE
Protein, ur: 100 mg/dL — AB
Specific Gravity, Urine: 1.025 (ref 1.005–1.030)
pH: 6 (ref 5.0–8.0)

## 2016-01-21 LAB — URINE MICROSCOPIC-ADD ON

## 2016-01-21 NOTE — Progress Notes (Signed)
FHT from this am reviewed.  Reactive NST, no regular ctx.

## 2016-01-21 NOTE — MAU Provider Note (Signed)
History     CSN: [id number]  Arrival date and time: 01/21/16 [id number]   First Provider Initiated Contact with Patient 01/21/16 [phone]      Chief Complaint  Patient presents with  . Contractions   Abdominal Pain This is a new problem. The current episode started today. The onset quality is sudden. The problem occurs intermittently (patient unable to time contractions ). The problem has been unchanged. The pain is located in the suprapubic region. The pain is at a severity of 8/10. The quality of the pain is cramping. The abdominal pain radiates to the LLQ. Pertinent negatives include no constipation, diarrhea, dysuria, fever, frequency, nausea or vomiting. Nothing aggravates the pain. The pain is relieved by nothing. She has tried nothing for the symptoms.    Past Medical History  Diagnosis Date  . Bipolar disorder ([place])   . Attention deficit hyperactivity disorder   . Depression   . Anxiety   . Asthma   . UTI (urinary tract infection)     Completed Cipro 08/06/12  . Gastritis     Past Surgical History  Procedure Laterality Date  . Mouth surgery    . Eye muscle surgery Bilateral     07/29/12  . Plantar fascia surgery      Family History  Problem Relation Age of Onset  . Depression Mother   . Depression Father   . Depression Brother   . Colon cancer Mother   . Colon polyps Mother   . Liver disease    . Kidney disease      Social History  Substance Use Topics  . Smoking status: Never Smoker   . Smokeless tobacco: Never Used  . Alcohol Use: No    Allergies:  Allergies  Allergen Reactions  . Depakote [Divalproex Sodium] Other (See Comments)    Pt stated that her blood level was toxic when she had this medication.    . Methylphenidate Derivatives Other (See Comments)    Reaction:  Depression and anger   . Neurontin [Gabapentin] Other (See Comments)    Reaction:  Dizziness   . Prozac [Fluoxetine Hcl] Other (See Comments)    Reaction:  Anger     Prescriptions prior  to admission  Medication Sig Dispense Refill Last Dose  . acetaminophen (TYLENOL) 500 MG tablet Take 1,000 mg by mouth every 6 (six) hours as needed for mild pain or headache.   12/27/2015 at Unknown time  . Prenatal Vit-Fe Fumarate-FA (PRENATAL MULTIVITAMIN) TABS tablet Take 1 tablet by mouth daily at 12 noon.   12/28/2015 at Unknown time  . promethazine (PHENERGAN) 12.5 MG tablet Take 1 tablet (12.5 mg total) by mouth every 6 (six) hours as needed for nausea. 15 tablet 0     Review of Systems  Constitutional: Negative for fever and chills.  Gastrointestinal: Positive for abdominal pain. Negative for nausea, vomiting, diarrhea and constipation.  Genitourinary: Negative for dysuria, urgency and frequency.   Physical Exam   Blood pressure 91/60, pulse 108, temperature 98 F (36.7 C), temperature source Oral, resp. rate 18, last menstrual period 05/12/2015, SpO2 100 %.  Physical Exam  Nursing note and vitals reviewed. Constitutional: She is oriented to person, place, and time. She appears well-developed and well-nourished. No distress.  HENT:  Head: Normocephalic.  Cardiovascular: Normal rate.   Respiratory: Effort normal.  GI: Soft. There is no tenderness. There is no rebound.  Neurological: She is alert and oriented to person, place, and time.  Skin: Skin is warm and dry.  Psychiatric: She has a normal mood and affect.   FHT: 130, moderate with 15x15 accels, no decels Toco: one UC  MAU Course  Procedures  MDM [id number]: D/W Dr. Willis [place], ok for dc home.   Assessment and Plan   1. Round ligament pain   2. [redacted] weeks gestation of pregnancy    DC home Comfort measures reviewed  3rd Trimester precautions  PTL precautions  Fetal kick counts RX: none  Return to MAU as needed FU with OB as planned  Follow-up Information    Follow up with Clarene Duke, MD.   Specialty:  Obstetrics and Gynecology   Why:  As scheduled   Contact information:   [address]  [place] [address] [phone]         Mathis Bud 01/21/2016, 7:15 AM

## 2016-01-21 NOTE — MAU Note (Signed)
Patient presents to MAU with c/o contractions that are 10/10 painful. States pain happens only when she moves her legs. Denies LOF, VB at this time. +FM

## 2016-01-21 NOTE — Discharge Instructions (Signed)

## 2016-01-28 DIAGNOSIS — R300 Dysuria: Secondary | ICD-10-CM | POA: Diagnosis not present

## 2016-01-30 ENCOUNTER — Encounter (HOSPITAL_COMMUNITY): Payer: Self-pay

## 2016-02-07 DIAGNOSIS — R809 Proteinuria, unspecified: Secondary | ICD-10-CM | POA: Diagnosis not present

## 2016-02-08 ENCOUNTER — Telehealth (HOSPITAL_COMMUNITY): Payer: Self-pay | Admitting: *Deleted

## 2016-02-08 ENCOUNTER — Encounter (HOSPITAL_COMMUNITY): Payer: Self-pay | Admitting: *Deleted

## 2016-02-08 NOTE — Telephone Encounter (Signed)
Preadmission screen  

## 2016-02-09 ENCOUNTER — Inpatient Hospital Stay (HOSPITAL_COMMUNITY): Payer: Medicare Other | Admitting: Anesthesiology

## 2016-02-09 ENCOUNTER — Inpatient Hospital Stay (HOSPITAL_COMMUNITY)
Admission: AD | Admit: 2016-02-09 | Discharge: 2016-02-13 | DRG: 775 | Disposition: A | Discharge status: Home / Self Care | Payer: Medicare Other | Source: Ambulatory Visit | Attending: Obstetrics and Gynecology | Admitting: Obstetrics and Gynecology

## 2016-02-09 ENCOUNTER — Encounter (HOSPITAL_COMMUNITY): Payer: Self-pay | Admitting: *Deleted

## 2016-02-09 DIAGNOSIS — Z800 Family history of malignant neoplasm of digestive organs: Secondary | ICD-10-CM

## 2016-02-09 DIAGNOSIS — F319 Bipolar disorder, unspecified: Secondary | ICD-10-CM | POA: Diagnosis not present

## 2016-02-09 DIAGNOSIS — O1400 Mild to moderate pre-eclampsia, unspecified trimester: Secondary | ICD-10-CM

## 2016-02-09 DIAGNOSIS — O99824 Streptococcus B carrier state complicating childbirth: Secondary | ICD-10-CM | POA: Diagnosis not present

## 2016-02-09 DIAGNOSIS — O1404 Mild to moderate pre-eclampsia, complicating childbirth: Principal | ICD-10-CM | POA: Diagnosis present

## 2016-02-09 DIAGNOSIS — O2690 Pregnancy related conditions, unspecified, unspecified trimester: Secondary | ICD-10-CM | POA: Diagnosis not present

## 2016-02-09 DIAGNOSIS — Z3A38 38 weeks gestation of pregnancy: Secondary | ICD-10-CM

## 2016-02-09 DIAGNOSIS — Z23 Encounter for immunization: Secondary | ICD-10-CM | POA: Diagnosis not present

## 2016-02-09 DIAGNOSIS — O99344 Other mental disorders complicating childbirth: Secondary | ICD-10-CM | POA: Diagnosis not present

## 2016-02-09 DIAGNOSIS — O1493 Unspecified pre-eclampsia, third trimester: Secondary | ICD-10-CM

## 2016-02-09 HISTORY — DX: Mild to moderate pre-eclampsia, unspecified trimester: O14.00

## 2016-02-09 LAB — URINALYSIS, ROUTINE W REFLEX MICROSCOPIC
Glucose, UA: NEGATIVE mg/dL
Hgb urine dipstick: NEGATIVE
Ketones, ur: 15 mg/dL — AB
Leukocytes, UA: NEGATIVE
Nitrite: POSITIVE — AB
Protein, ur: >300 mg/dL — AB
Specific Gravity, Urine: >1.03 — ABNORMAL HIGH (ref 1.005–1.030)
pH: 6 (ref 5.0–8.0)

## 2016-02-09 LAB — COMPREHENSIVE METABOLIC PANEL
ALT: 29 U/L (ref 14–54)
AST: 27 U/L (ref 15–41)
Albumin: 3 g/dL — ABNORMAL LOW (ref 3.5–5.0)
Alkaline Phosphatase: 221 U/L — ABNORMAL HIGH (ref 38–126)
Anion gap: 9 (ref 5–15)
BUN: 9 mg/dL (ref 6–20)
CO2: 23 mmol/L (ref 22–32)
Calcium: 8.8 mg/dL — ABNORMAL LOW (ref 8.9–10.3)
Chloride: 107 mmol/L (ref 101–111)
Creatinine, Ser: 0.95 mg/dL (ref 0.44–1.00)
GFR calc Af Amer: >60 mL/min (ref >60)
GFR calc non Af Amer: >60 mL/min (ref >60)
Glucose, Bld: 117 mg/dL — ABNORMAL HIGH (ref 65–99)
Potassium: 4.4 mmol/L (ref 3.5–5.1)
Sodium: 139 mmol/L (ref 135–145)
Total Bilirubin: 0.8 mg/dL (ref 0.3–1.2)
Total Protein: 6.6 g/dL (ref 6.5–8.1)

## 2016-02-09 LAB — ABO/RH: ABO/RH(D): O POS

## 2016-02-09 LAB — URINE MICROSCOPIC-ADD ON

## 2016-02-09 LAB — CBC
HCT: 36.3 % (ref 36.0–46.0)
Hemoglobin: 11.7 g/dL — ABNORMAL LOW (ref 12.0–15.0)
MCH: 28.1 pg (ref 26.0–34.0)
MCHC: 32.2 g/dL (ref 30.0–36.0)
MCV: 87.3 fL (ref 78.0–100.0)
Platelets: 138 10*3/uL — ABNORMAL LOW (ref 150–400)
RBC: 4.16 MIL/uL (ref 3.87–5.11)
RDW: 14.7 % (ref 11.5–15.5)
WBC: 8.4 10*3/uL (ref 4.0–10.5)

## 2016-02-09 LAB — PROTEIN / CREATININE RATIO, URINE
Creatinine, Urine: 519 mg/dL
Protein Creatinine Ratio: 1.35 mg/mg{Cre} — ABNORMAL HIGH (ref 0.00–0.15)
Total Protein, Urine: 699 mg/dL

## 2016-02-09 LAB — TYPE AND SCREEN
ABO/RH(D): O POS
Antibody Screen: NEGATIVE

## 2016-02-09 MED ORDER — PHENYLEPHRINE 40 MCG/ML (10ML) SYRINGE FOR IV PUSH (FOR BLOOD PRESSURE SUPPORT)
80.0000 ug | PREFILLED_SYRINGE | INTRAVENOUS | Status: DC | PRN
  Filled 2016-02-09 (×2): qty 20
  Filled 2016-02-09: qty 2

## 2016-02-09 MED ORDER — FENTANYL 2.5 MCG/ML BUPIVACAINE 1/10 % EPIDURAL INFUSION (WH - ANES)
14.0000 mL/h | INTRAMUSCULAR | Status: DC | PRN
Start: 2016-02-09 — End: 2016-02-11
  Administered 2016-02-09 – 2016-02-11 (×6): 14 mL/h via EPIDURAL
  Filled 2016-02-09 (×6): qty 125

## 2016-02-09 MED ORDER — LACTATED RINGERS IV SOLN
INTRAVENOUS | Status: DC
  Administered 2016-02-09: 17:00:00 via INTRAVENOUS
  Administered 2016-02-10: 125 mL/h via INTRAVENOUS
  Administered 2016-02-10: 11:00:00 via INTRAVENOUS

## 2016-02-09 MED ORDER — ACETAMINOPHEN 325 MG PO TABS: 650.0000 mg | ORAL_TABLET | ORAL | Status: DC | PRN

## 2016-02-09 MED ORDER — LACTATED RINGERS IV SOLN: 500.0000 mL | INTRAVENOUS | Status: DC | PRN

## 2016-02-09 MED ORDER — OXYTOCIN BOLUS FROM INFUSION: 500.0000 mL | INTRAVENOUS | Status: DC

## 2016-02-09 MED ORDER — LABETALOL HCL 5 MG/ML IV SOLN: 20.0000 mg | INTRAVENOUS | Status: DC | PRN

## 2016-02-09 MED ORDER — LACTATED RINGERS IV SOLN
1.0000 m[IU]/min | INTRAVENOUS | Status: DC
  Administered 2016-02-09: 2 m[IU]/min via INTRAVENOUS
  Filled 2016-02-09: qty 4

## 2016-02-09 MED ORDER — LACTATED RINGERS IV SOLN: 500.0000 mL | Freq: Once | INTRAVENOUS | Status: DC

## 2016-02-09 MED ORDER — PENICILLIN G POTASSIUM 5000000 UNITS IJ SOLR
5.0000 10*6.[IU] | Freq: Once | INTRAVENOUS | Status: AC
  Administered 2016-02-09: 5 10*6.[IU] via INTRAVENOUS
  Filled 2016-02-09: qty 5

## 2016-02-09 MED ORDER — CITRIC ACID-SODIUM CITRATE 334-500 MG/5ML PO SOLN
30.0000 mL | ORAL | Status: DC | PRN
Start: 2016-02-09 — End: 2016-02-11

## 2016-02-09 MED ORDER — HYDRALAZINE HCL 20 MG/ML IJ SOLN: 10.0000 mg | Freq: Once | INTRAMUSCULAR | Status: DC | PRN

## 2016-02-09 MED ORDER — PHENYLEPHRINE 40 MCG/ML (10ML) SYRINGE FOR IV PUSH (FOR BLOOD PRESSURE SUPPORT)
80.0000 ug | PREFILLED_SYRINGE | INTRAVENOUS | Status: DC | PRN
  Filled 2016-02-09: qty 2

## 2016-02-09 MED ORDER — LIDOCAINE HCL (PF) 1 % IJ SOLN
30.0000 mL | INTRAMUSCULAR | Status: DC | PRN
  Filled 2016-02-09: qty 30

## 2016-02-09 MED ORDER — EPHEDRINE 5 MG/ML INJ
10.0000 mg | INTRAVENOUS | Status: DC | PRN
  Filled 2016-02-09: qty 2

## 2016-02-09 MED ORDER — BUTORPHANOL TARTRATE 1 MG/ML IJ SOLN
1.0000 mg | INTRAMUSCULAR | Status: DC | PRN
Start: 2016-02-09 — End: 2016-02-11

## 2016-02-09 MED ORDER — OXYCODONE-ACETAMINOPHEN 5-325 MG PO TABS: 1.0000 | ORAL_TABLET | ORAL | Status: DC | PRN

## 2016-02-09 MED ORDER — DIPHENHYDRAMINE HCL 50 MG/ML IJ SOLN: 12.5000 mg | INTRAMUSCULAR | Status: DC | PRN

## 2016-02-09 MED ORDER — TERBUTALINE SULFATE 1 MG/ML IJ SOLN
0.2500 mg | Freq: Once | INTRAMUSCULAR | Status: DC | PRN
  Filled 2016-02-09: qty 1

## 2016-02-09 MED ORDER — LACTATED RINGERS IV SOLN: 2.5000 [IU]/h | INTRAVENOUS | Status: DC

## 2016-02-09 MED ORDER — ONDANSETRON HCL 4 MG/2ML IJ SOLN
4.0000 mg | Freq: Four times a day (QID) | INTRAMUSCULAR | Status: DC | PRN
  Administered 2016-02-10: 4 mg via INTRAVENOUS
  Filled 2016-02-09: qty 2

## 2016-02-09 MED ORDER — ONDANSETRON 8 MG PO TBDP
8.0000 mg | ORAL_TABLET | Freq: Once | ORAL | Status: DC
  Filled 2016-02-09: qty 1

## 2016-02-09 MED ORDER — OXYCODONE-ACETAMINOPHEN 5-325 MG PO TABS: 2.0000 | ORAL_TABLET | ORAL | Status: DC | PRN

## 2016-02-09 MED ORDER — PENICILLIN G POTASSIUM 5000000 UNITS IJ SOLR
2.5000 10*6.[IU] | INTRAVENOUS | Status: DC
  Administered 2016-02-09 – 2016-02-11 (×8): 2.5 10*6.[IU] via INTRAVENOUS
  Filled 2016-02-09 (×12): qty 2.5

## 2016-02-09 MED ORDER — LIDOCAINE HCL (PF) 1 % IJ SOLN
INTRAMUSCULAR | Status: DC | PRN
  Administered 2016-02-09 (×2): 4 mL

## 2016-02-09 NOTE — Progress Notes (Signed)
Patient ID: [name], female   DOB: [birth year], 30 y.o.   MRN: [id number]  Comfortable with epidural  AF 119-166/87-93 urine concentrated gen NAD FHTs 120's, good var, category 1 toco Q 4 min  AROM for clear fluid w/o diff/comp 2.3/70/-2  If need to treat BP, labetalol protocol, and Magnesium - d/w pt Plan for SVD

## 2016-02-09 NOTE — Consults (Signed)
  Anesthesia Pain Consult Note  Patient: [name], 30 y.o., female  Consult Requested by: Janyth Contes, MD  Reason for Consult:CRNA Pain Management  Level of Consciousness: alert  Pain 1 Goal 7

## 2016-02-09 NOTE — MAU Provider Note (Signed)
Chief Complaint:  Hypertension and Headache   First Provider Initiated Contact with Patient 02/09/16 1544     HPI: [name] is a 30 y.o. G1P0 at [redacted]w[redacted]d who presents to maternity admissions reporting elevated BP 170/100's at home today, N/V x 2, HA since 0500. Vomited after phenergan and Tylenol.   Location: Bilat temples Quality: sharp Severity: 6/10 in pain scale Duration: 11 hours Context: None Timing: intermittent Modifying factors: Mild improvement w/ Tylenol Associated signs and symptoms: Pos for N/V, rib pain. Neg for fever, chills, vision changes, difficulties w/ speech or gait.  Location: Right ribs Quality: pressure Severity: 6/10 in pain scale Duration: 11 hours Context: None Timing: constant Modifying factors: None Associated signs and symptoms: Pos for HA, N/V. Neg for fever, chills, vision changes.  Denies contractions, leakage of fluid or vaginal bleeding. Good fetal movement.   Pregnancy Course: Significant psych Hx. Proteinuria, Hx previa, resolved.    Past Medical History: Past Medical History  Diagnosis Date  . Anxiety   . Asthma   . UTI (urinary tract infection)     Completed Cipro 08/06/12  . Gastritis   . Hx of varicella   . GERD (gastroesophageal reflux disease)   . Bipolar disorder [facility])     hospitalized as a teen for suicidal ideations and cutting  . Attention deficit hyperactivity disorder   . Depression   . HPV (human papilloma virus) anogenital infection   . Vaginal Pap smear, abnormal     Past obstetric history: OB History  Gravida Para Term Preterm AB SAB TAB Ectopic Multiple Living  1 0            # Outcome Date GA Lbr Len/2nd Weight Sex Delivery Anes PTL Lv  1 Current               Past Surgical History: Past Surgical History  Procedure Laterality Date  . Mouth surgery    . Eye muscle surgery Bilateral     07/29/12  . Plantar fascia surgery       Family History: Family History  Problem Relation Age of Onset  .  Depression Mother   . Depression Father   . Depression Brother   . Colon cancer Mother   . Colon polyps Mother   . Liver disease    . Kidney disease      Social History: Social History  Substance Use Topics  . Smoking status: Never Smoker   . Smokeless tobacco: Never Used  . Alcohol Use: No    Allergies:  Allergies  Allergen Reactions  . Methylphenidate Derivatives Other (See Comments)    Reaction:  Depression and anger   . Neurontin [Gabapentin] Other (See Comments)    Reaction:  Dizziness   . Prozac [Fluoxetine Hcl] Other (See Comments)    Reaction:  Anger     Meds:  Prescriptions prior to admission  Medication Sig Dispense Refill Last Dose  . acetaminophen (TYLENOL) 500 MG tablet Take 1,000 mg by mouth every 6 (six) hours as needed for mild pain or headache.   Past Week at Unknown time  . Prenatal Vit-Fe Fumarate-FA (PRENATAL MULTIVITAMIN) TABS tablet Take 1 tablet by mouth at bedtime.    02/08/2016 at Unknown time  . promethazine (PHENERGAN) 12.5 MG tablet Take 1 tablet (12.5 mg total) by mouth every 6 (six) hours as needed for nausea. 15 tablet 0 Past Month at Unknown time    I have reviewed patient's Past Medical Hx, Surgical Hx, Family Hx, Social  Hx, medications and allergies.   ROS:  Review of Systems  Constitutional: Negative for fever and chills.  Eyes: Negative for visual disturbance.  Gastrointestinal: Positive for nausea and vomiting. Negative for abdominal pain, diarrhea and constipation.  Genitourinary: Negative for vaginal bleeding.  Musculoskeletal:       Right rib pain  Neurological: Positive for headaches. Negative for dizziness and speech difficulty.    Physical Exam   Patient Vitals for the past 24 hrs:  BP Pulse SpO2  02/09/16 1548 129/98 mmHg 97 -  02/09/16 1539 - 115 96 %  02/09/16 1535 - 94 96 %  02/09/16 1534 166/93 mmHg 82 94 %  02/09/16 1533 - 74 94 %   Constitutional: Well-developed, well-nourished female in no acute distress.   Psych: Agitated.  Cardiovascular: normal rate Respiratory: normal effort GI: Abd soft, non-tender, gravid S>D.  MS: Extremities nontender, 1+ edema, normal ROM Neurologic: Alert and oriented x 4.  GU: Deferred    FHT:  Baseline 125 , moderate variability, accelerations present, no decelerations Contractions: Irreg, mild   Labs: Results for orders placed or performed during the hospital encounter of 02/09/16 (from the past 24 hour(s))  Comprehensive metabolic panel     Status: Abnormal   Collection Time: 02/09/16  3:45 PM  Result Value Ref Range   Sodium 139 135 - 145 mmol/L   Potassium 4.4 3.5 - 5.1 mmol/L   Chloride 107 101 - 111 mmol/L   CO2 23 22 - 32 mmol/L   Glucose, Bld 117 (H) 65 - 99 mg/dL   BUN 9 6 - 20 mg/dL   Creatinine, Ser 0.95 0.44 - 1.00 mg/dL   Calcium 8.8 (L) 8.9 - 10.3 mg/dL   Total Protein 6.6 6.5 - 8.1 g/dL   Albumin 3.0 (L) 3.5 - 5.0 g/dL   AST 27 15 - 41 U/L   ALT 29 14 - 54 U/L   Alkaline Phosphatase 221 (H) 38 - 126 U/L   Total Bilirubin 0.8 0.3 - 1.2 mg/dL   GFR calc non Af Amer >60 >60 mL/min   GFR calc Af Amer >60 >60 mL/min   Anion gap 9 5 - 15  CBC     Status: Abnormal   Collection Time: 02/09/16  3:45 PM  Result Value Ref Range   WBC 8.4 4.0 - 10.5 K/uL   RBC 4.16 3.87 - 5.11 MIL/uL   Hemoglobin 11.7 (L) 12.0 - 15.0 g/dL   HCT 36.3 36.0 - 46.0 %   MCV 87.3 78.0 - 100.0 fL   MCH 28.1 26.0 - 34.0 pg   MCHC 32.2 30.0 - 36.0 g/dL   RDW 14.7 11.5 - 15.5 %   Platelets 138 (L) 150 - 400 K/uL    Imaging:  No results found.  MAU Course: Pre-E order set, Zofran.   MDM: 30 year-old female at 37.[redacted] weeks gestation w/ Severe-range BP, proteinuria and HA concerning for Pre-E.   Assessment: GHTN vs Pre-E  Plan: Admit to L&D per consult w/ Dr. Melba Coon for IOL.  [place], CNM 02/09/2016 4:20 PM

## 2016-02-09 NOTE — H&P (Signed)
[name] is a 30 y.o. female G1P0 at 17+ with PreE by 24 hr urine.  Today with increased sx's and BP - induction scheduled for Monday, but will induce today.  Pregnancy complicated PreEclampsia and poor social situation.  Psychiatrist Dr Ronelle Nigh - has multiple psychiatric diagnoses - including ADD, bipolar, h/o Behavioral  Admissions for SI.  Some question of paternity of infant.  Pfregnancy dated by 6 wk [place].      Maternal Medical History:  Reason for admission: Contractions.   Contractions: Frequency: irregular.   Perceived severity is strong.    Fetal activity: Perceived fetal activity is normal.    Prenatal complications: Pre-eclampsia.   Prenatal Complications - Diabetes: none.    OB History    Gravida Para Term Preterm AB TAB SAB Ectopic Multiple Living   1 0            G1 present  + abn pap - last WNL No STDs  Past Medical History  Diagnosis Date  . Anxiety   . UTI (urinary tract infection)     Completed Cipro 08/06/12  . Gastritis   . Hx of varicella   . GERD (gastroesophageal reflux disease)   . Bipolar disorder [facility])     hospitalized as a teen for suicidal ideations and cutting  . Attention deficit hyperactivity disorder   . Depression   . HPV (human papilloma virus) anogenital infection   . Vaginal Pap smear, abnormal   . Asthma     inhaler PRN-hasn't used inhaler in months  . Mild preeclampsia 02/09/2016   Past Surgical History  Procedure Laterality Date  . Mouth surgery    . Eye muscle surgery Bilateral     07/29/12  . Plantar fascia surgery     Family History: family history includes Colon cancer in her mother; Colon polyps in her mother; Depression in her brother, father, and mother. Social History:  reports that she has never smoked. She has never used smokeless tobacco. She reports that she does not drink alcohol or use illicit drugs.disabled, in a relationship.    Meds: Citalopram, olanzapine, PNV All Depakote, neurontin, prozac,  ritalin   Prenatal Transfer Tool  Maternal Diabetes: No Genetic Screening: Normal Maternal Ultrasounds/Referrals: Normal Fetal Ultrasounds or other Referrals:  None Maternal Substance Abuse:  No Significant Maternal Medications:  Meds include: Other: Citalopram, olanzapine Significant Maternal Lab Results:  Lab values include: Group B Strep negative Other Comments:  nl panorama, female;   Review of Systems  Constitutional: Negative.   Respiratory: Negative.   Cardiovascular: Negative.   Gastrointestinal: Positive for abdominal pain.  Genitourinary: Negative.   Musculoskeletal: Positive for myalgias.  Skin: Negative.   Neurological: Positive for headaches.  Psychiatric/Behavioral: Negative.     Dilation: 1.5 Effacement (%): 80 Exam by:: C. Jone, RN Blood pressure 152/90, pulse 86, temperature 98.5 F (36.9 C), temperature source Oral, resp. rate 20, height 5\' 7"  (1.702 m), weight 115.667 kg (255 lb), last menstrual period 05/12/2015, SpO2 96 %. Maternal Exam:  Uterine Assessment: Contraction strength is moderate.  Contraction frequency is irregular.   Abdomen: Patient reports no abdominal tenderness. Fundal height is appropriate for gestation.   Estimated fetal weight is 7#.   Fetal presentation: vertex  Introitus: Normal vulva. Normal vagina.  Cervix: Cervix evaluated by digital exam.     Physical Exam  Constitutional: She is oriented to person, place, and time. She appears well-developed and well-nourished.  obese  HENT:  Head: Normocephalic and atraumatic.  Cardiovascular: Normal  rate and regular rhythm.   Respiratory: Effort normal and breath sounds normal. No respiratory distress. She has no wheezes.  GI: Soft. Bowel sounds are normal. She exhibits no distension. There is no tenderness.  Musculoskeletal: Normal range of motion.  Neurological: She is alert and oriented to person, place, and time.  Skin: Skin is warm and dry.  Psychiatric: She has a normal mood  and affect. Her behavior is normal.    Prenatal labs: ABO, Rh: --/--/O POS (02/25 1705) Antibody: NEG (02/25 1705) Rubella: Immune (08/01 0000) RPR: Nonreactive (08/01 0000)  HBsAg: Negative (08/01 0000)  HIV: Non-reactive (08/01 0000)  GBS: Positive (08/01 0000)   Panorama low risk, female; GBBS +, Hgb 13.6/Plt 192/UrCx GBBS/Varicella immune/Pap WNL/CF neg/glucola 114  [place] dated by 6 wk [place] Nl anat, limited heart, post fundal plac, nl anat o/w, female Nl cardiac anat  Assessment/Plan: 29yo G1 at 39+ with PreE for IOL given elevated BP and sx's gbbs + - PCN for prophylaxis AROM and pitocin to augment Magnesium if pressures are treated for sz prophylaxis, Labetalol protocol Epidural prn   Bovard-Stuckert, Alberto Schoch 02/09/2016, 8:44 PM

## 2016-02-09 NOTE — Anesthesia Procedure Notes (Addendum)
Epidural Patient location during procedure: OB  Staffing Anesthesiologist: JUDD, MARY Performed by: anesthesiologist   Preanesthetic Checklist Completed: patient identified, site marked, surgical consent, pre-op evaluation, timeout performed, IV checked, risks and benefits discussed and monitors and equipment checked  Epidural Patient position: sitting Prep: site prepped and draped and DuraPrep Patient monitoring: continuous pulse ox and blood pressure Approach: midline Location: L3-L4 Injection technique: LOR saline  Needle:  Needle type: Tuohy  Needle gauge: 17 G Needle length: 9 cm and 9 Needle insertion depth: 9 cm Catheter type: closed end flexible Catheter size: 19 Gauge Catheter at skin depth: 15 cm Test dose: negative  Assessment Events: blood not aspirated, injection not painful, no injection resistance, negative IV test and no paresthesia  Additional Notes Patient identified. Risks/Benefits/Options discussed with patient including but not limited to bleeding, infection, nerve damage, paralysis, failed block, incomplete pain control, headache, blood pressure changes, nausea, vomiting, reactions to medication both or allergic, itching and postpartum back pain. Confirmed with bedside nurse the patient's most recent platelet count. Confirmed with patient that they are not currently taking any anticoagulation, have any bleeding history or any family history of bleeding disorders. Patient expressed understanding and wished to proceed. All questions were answered. Sterile technique was used throughout the entire procedure. Please see nursing notes for vital signs. Test dose was given through epidural catheter and negative prior to continuing to dose epidural or start infusion. Warning signs of high block given to the patient including shortness of breath, tingling/numbness in hands, complete motor block, or any concerning symptoms with instructions to call for help. Patient was  given instructions on fall risk and not to get out of bed. All questions and concerns addressed with instructions to call with any issues or inadequate analgesia.     Epidural Patient location during procedure: OB Start time: 02/10/2016 1:04 PM  Staffing Anesthesiologist: Josephine Igo Performed by: anesthesiologist   Preanesthetic Checklist Completed: patient identified, site marked, surgical consent, pre-op evaluation, timeout performed, IV checked, risks and benefits discussed and monitors and equipment checked  Epidural Patient position: sitting Prep: site prepped and draped and DuraPrep Patient monitoring: continuous pulse ox and blood pressure Approach: midline Location: L3-L4 Injection technique: LOR air  Needle:  Needle type: Tuohy  Needle gauge: 17 G Needle length: 9 cm and 9 Needle insertion depth: 8 cm Catheter type: closed end flexible Catheter size: 19 Gauge Catheter at skin depth: 13 cm Test dose: negative and Other  Assessment Events: blood not aspirated, injection not painful, no injection resistance, negative IV test and no paresthesia  Additional Notes Patient identified. Risks and benefits discussed including failed block, incomplete  Pain control, post dural puncture headache, nerve damage, paralysis, blood pressure Changes, nausea, vomiting, reactions to medications-both toxic and allergic and post Partum back pain. All questions were answered. Patient expressed understanding and wished to proceed. Sterile technique was used throughout procedure. Epidural site was Dressed with sterile barrier dressing. No paresthesias, signs of intravascular injection Or signs of intrathecal spread were encountered.  Patient was more comfortable after the epidural was dosed. Please see RN's note for documentation of vital signs and FHR which are stable.

## 2016-02-09 NOTE — MAU Note (Addendum)
Dr Melba Coon notified of pt's blood pressure and pt complaints, orders received to admit pt

## 2016-02-09 NOTE — Anesthesia Preprocedure Evaluation (Addendum)
Anesthesia Evaluation  Patient identified by MRN, date of birth, ID band Patient awake    Reviewed: Allergy & Precautions, NPO status , Patient's Chart, lab work & pertinent test results  History of Anesthesia Complications Negative for: history of anesthetic complications  Airway Mallampati: II  TM Distance: >3 FB Neck ROM: Full    Dental no notable dental hx. (+) Dental Advisory Given, Edentulous Upper, Missing   Pulmonary asthma , sleep apnea ,    Pulmonary exam normal breath sounds clear to auscultation       Cardiovascular hypertension (PreE with severe features), Normal cardiovascular exam Rhythm:Regular Rate:Normal     Neuro/Psych PSYCHIATRIC DISORDERS Anxiety Depression Bipolar Disorder negative neurological ROS     GI/Hepatic Neg liver ROS, GERD  Medicated and Controlled,  Endo/Other  negative endocrine ROS  Renal/GU negative Renal ROS  negative genitourinary   Musculoskeletal negative musculoskeletal ROS (+)   Abdominal   Peds negative pediatric ROS (+)  Hematology negative hematology ROS (+)   Anesthesia Other Findings   Reproductive/Obstetrics (+) Pregnancy                           Anesthesia Physical Anesthesia Plan  ASA: III  Anesthesia Plan: Epidural   Post-op Pain Management:    Induction:   Airway Management Planned:   Additional Equipment:   Intra-op Plan:   Post-operative Plan:   Informed Consent: I have reviewed the patients History and Physical, chart, labs and discussed the procedure including the risks, benefits and alternatives for the proposed anesthesia with the patient or authorized representative who has indicated his/her understanding and acceptance.   Dental advisory given  Plan Discussed with: CRNA  Anesthesia Plan Comments:         Anesthesia Quick Evaluation

## 2016-02-10 LAB — RPR: RPR Ser Ql: NONREACTIVE

## 2016-02-10 MED ORDER — BUPIVACAINE HCL (PF) 0.25 % IJ SOLN
INTRAMUSCULAR | Status: DC | PRN
  Administered 2016-02-10 (×2): 4 mL via EPIDURAL

## 2016-02-10 NOTE — MAU Note (Signed)
EMS had reported to [place], Frazier Richards NT (present) that the "house is straight out of hoarders, it is a bad situation."

## 2016-02-10 NOTE — Progress Notes (Signed)
Patient ID: [name], female   DOB: [birth year], 30 y.o.   MRN: [id number]  Fairly comfortable with epidural.    AF VSS gen NAD FHTs 130's, good var, category 1 toco q 2 - 32min, difficult to trace w/o IUPC  IUPC placed w/o diff/complication SVE [id number]  Continue current mgmt
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
## 2016-02-10 NOTE — Progress Notes (Signed)
Patient ID: [name], female   DOB: 1986/03/07, 30 y.o.   MRN: [id number]  Comfortable with epidural  AF 126-169/82-84 (140/96)  gen NAD FHTs 130's, good var, category 1 toco Q 2-32min  SVE 9/90/+1  Continue current mgmt

## 2016-02-10 NOTE — Progress Notes (Signed)
Was called into room to assist patient mother while patient was getting epidural. Pt mother appeared stressed. She verbalized that she did not have her "medications,diapers, nothing, especially her oxygen". She was concerned that once her tank ran out that she would not be able to breath. Once patient got her epidural, I discussed with patient, S/O and mother the best plan for everyone. The patient mother verbalized that she NEEDED to go home and once she was set up she would try and care for herself. They verbalized that they did not have any transportation, nor the financial means to go home. Left message with social worker and discussed situation with house coverage. Discussed with family that we would give them a one time cab voucher to take the mother home, set her up with her essential needs, make arrangements for someone to care for her, and then for the S/O to return to the hospital. I verbalized my concern that someone needed to care for the mother to meet her needs.

## 2016-02-10 NOTE — Progress Notes (Signed)
Pt and family received into room 169. Noted upon entering room foul smell. Noted patient to have very poor hygiene and dirty clothes. Pt offered a shower and accepted. Pt and S/O are the primary care givers to her mother who is frail and unable to care for herself. S/O is needed to pick up patient and transfer from wc to couch, ect.

## 2016-02-10 NOTE — Progress Notes (Signed)
Patient ID: [name], female   DOB: 01/21/1986, 30 y.o.   MRN: [id number]  Comfortable with epidural   AF BP elevated 126-169/82-84 (137/79) concentrated urine  gen NAD FHTs 120-130's, mod var, category 1 toco q q 2-3 min  SVE 7.8/90/0-+1  Continue pitocin, IOL, expect SVD

## 2016-02-10 NOTE — Progress Notes (Signed)
Patient ID: [name], female   DOB: 1986/07/15, 30 y.o.   MRN: [id number]  Comfortable with epidural  AF 137/79 concentrated urine gen NAD FHTs 120's, good var, category 1 - scalp stim with SVE toco q 2-3 min  mVus not adequate, will increase pitocin  Expect SVD

## 2016-02-11 ENCOUNTER — Encounter (HOSPITAL_COMMUNITY): Payer: Self-pay | Admitting: Obstetrics and Gynecology

## 2016-02-11 ENCOUNTER — Inpatient Hospital Stay (HOSPITAL_COMMUNITY): Admission: RE | Admit: 2016-02-11 | Payer: Medicare Other | Source: Ambulatory Visit

## 2016-02-11 LAB — CBC
HCT: 31 % — ABNORMAL LOW (ref 36.0–46.0)
Hemoglobin: 9.9 g/dL — ABNORMAL LOW (ref 12.0–15.0)
MCH: 28 pg (ref 26.0–34.0)
MCHC: 31.9 g/dL (ref 30.0–36.0)
MCV: 87.6 fL (ref 78.0–100.0)
Platelets: 140 10*3/uL — ABNORMAL LOW (ref 150–400)
RBC: 3.54 MIL/uL — ABNORMAL LOW (ref 3.87–5.11)
RDW: 14.7 % (ref 11.5–15.5)
WBC: 14.4 10*3/uL — ABNORMAL HIGH (ref 4.0–10.5)

## 2016-02-11 MED ORDER — ONDANSETRON HCL 4 MG/2ML IJ SOLN: 4.0000 mg | INTRAMUSCULAR | Status: DC | PRN

## 2016-02-11 MED ORDER — ZOLPIDEM TARTRATE 5 MG PO TABS: 5.0000 mg | ORAL_TABLET | Freq: Every evening | ORAL | Status: DC | PRN

## 2016-02-11 MED ORDER — WITCH HAZEL-GLYCERIN EX PADS: 1.0000 "application " | MEDICATED_PAD | CUTANEOUS | Status: DC | PRN

## 2016-02-11 MED ORDER — DIBUCAINE 1 % RE OINT: 1.0000 "application " | TOPICAL_OINTMENT | RECTAL | Status: DC | PRN

## 2016-02-11 MED ORDER — ONDANSETRON HCL 4 MG PO TABS
4.0000 mg | ORAL_TABLET | ORAL | Status: DC | PRN
  Administered 2016-02-11: 4 mg via ORAL
  Filled 2016-02-11: qty 1

## 2016-02-11 MED ORDER — PROMETHAZINE HCL 25 MG PO TABS: 12.5000 mg | ORAL_TABLET | Freq: Four times a day (QID) | ORAL | Status: DC | PRN

## 2016-02-11 MED ORDER — IBUPROFEN 600 MG PO TABS
600.0000 mg | ORAL_TABLET | Freq: Four times a day (QID) | ORAL | Status: DC
  Administered 2016-02-11 – 2016-02-12 (×6): 600 mg via ORAL
  Filled 2016-02-11 (×9): qty 1

## 2016-02-11 MED ORDER — SENNOSIDES-DOCUSATE SODIUM 8.6-50 MG PO TABS
2.0000 | ORAL_TABLET | ORAL | Status: DC
  Filled 2016-02-11 (×2): qty 2

## 2016-02-11 MED ORDER — ACETAMINOPHEN 325 MG PO TABS
650.0000 mg | ORAL_TABLET | ORAL | Status: DC | PRN
  Administered 2016-02-11 – 2016-02-12 (×3): 650 mg via ORAL
  Filled 2016-02-11 (×4): qty 2

## 2016-02-11 MED ORDER — LACTATED RINGERS IV SOLN: INTRAVENOUS | Status: DC

## 2016-02-11 MED ORDER — TETANUS-DIPHTH-ACELL PERTUSSIS 5-2.5-18.5 LF-MCG/0.5 IM SUSP: 0.5000 mL | Freq: Once | INTRAMUSCULAR | Status: DC

## 2016-02-11 MED ORDER — LANOLIN HYDROUS EX OINT: TOPICAL_OINTMENT | CUTANEOUS | Status: DC | PRN

## 2016-02-11 MED ORDER — PRENATAL MULTIVITAMIN CH
1.0000 | ORAL_TABLET | Freq: Every day | ORAL | Status: DC
  Filled 2016-02-11 (×2): qty 1

## 2016-02-11 MED ORDER — BENZOCAINE-MENTHOL 20-0.5 % EX AERO
1.0000 "application " | INHALATION_SPRAY | CUTANEOUS | Status: DC | PRN
  Administered 2016-02-11: 1 via TOPICAL
  Filled 2016-02-11: qty 56

## 2016-02-11 MED ORDER — DIPHENHYDRAMINE HCL 25 MG PO CAPS: 25.0000 mg | ORAL_CAPSULE | Freq: Four times a day (QID) | ORAL | Status: DC | PRN

## 2016-02-11 MED ORDER — SIMETHICONE 80 MG PO CHEW: 80.0000 mg | CHEWABLE_TABLET | ORAL | Status: DC | PRN

## 2016-02-11 NOTE — Anesthesia Postprocedure Evaluation (Signed)
Anesthesia Post Note  Patient: [name]  Procedure(s) Performed: * No procedures listed *  Patient location during evaluation: Mother Baby Anesthesia Type: Epidural Level of consciousness: awake, awake and alert and oriented Pain management: pain level controlled Vital Signs Assessment: post-procedure vital signs reviewed and stable Respiratory status: spontaneous breathing, nonlabored ventilation and respiratory function stable Cardiovascular status: stable Postop Assessment: no headache, no backache, patient able to bend at knees, no signs of nausea or vomiting and adequate PO intake Anesthetic complications: no    Last Vitals:  Filed Vitals:   02/11/16 0501 02/11/16 0620  BP: 142/102 139/82  Pulse: 78 69  Temp:  37.2 C  Resp: 20 16    Last Pain:  Filed Vitals:   02/11/16 0638  PainSc: 0-No pain                 Zalea Pete

## 2016-02-11 NOTE — Progress Notes (Signed)
Post Partum Day 0 Subjective: no complaints, up ad lib, voiding, tolerating PO and nl lochia, pain controlled  Objective: Blood pressure 139/82, pulse 69, temperature 98.9 F (37.2 C), temperature source Oral, resp. rate 16, height 5\' 7"  (1.702 m), weight 115.667 kg (255 lb), last menstrual period 05/12/2015, SpO2 95 %, unknown if currently breastfeeding.  Physical Exam:  General: alert and no distress Lochia: appropriate Uterine Fundus: firm   Recent Labs  02/09/16 1545 02/11/16 0512  HGB 11.7* 9.9*  HCT 36.3 31.0*    Assessment/Plan: Plan for discharge tomorrow or Wednesday.  Routine care.  SW consult.   LOS: 2 days   Bovard-Stuckert, Meron Bocchino 02/11/2016, 8:51 AM

## 2016-02-12 NOTE — Progress Notes (Signed)
Dr. Melba Coon notified of BP of 144/92. No further orders. Call if diastolic over [phone].

## 2016-02-12 NOTE — Progress Notes (Signed)
Post Partum Day 1 Subjective: no complaints, up ad lib, voiding, tolerating PO and nl lochia, pain controlled  Objective: Blood pressure 143/88, pulse 72, temperature 98.6 F (37 C), temperature source Oral, resp. rate 18, height 5\' 7"  (1.702 m), weight 115.667 kg (255 lb), last menstrual period 05/12/2015, SpO2 95 %, unknown if currently breastfeeding.  Physical Exam:  General: alert and no distress Lochia: appropriate Uterine Fundus: firm   Recent Labs  02/09/16 1545 02/11/16 0512  HGB 11.7* 9.9*  HCT 36.3 31.0*    Assessment/Plan: Plan for discharge tomorrow or Wednesday.  Monitor BP closely.  SW involvement.  Routine care.     LOS: 3 days   Bovard-Stuckert, Chantz Montefusco 02/12/2016, 7:37 AM

## 2016-02-12 NOTE — Care Management Important Message (Signed)
Important Message  Patient Details  Name: [name] MRN: [id number] Date of Birth: 1986-07-02   Medicare Important Message Given:       Shelda Altes 02/12/2016, 10:40 AMImportant Message  Patient Details  Name: [name] MRN: [id number] Date of Birth: Jul 26, 1986   Medicare Important Message Given:       Shelda Altes 02/12/2016, 10:39 AM

## 2016-02-12 NOTE — Plan of Care (Signed)
Problem: Coping: Goal: Ability to cope will improve Outcome: Progressing Hx: anx/ depression, ADHD & bipolar Goal: Ability to identify and utilize available resources and services will improve Outcome: Progressing SW & CPS notified

## 2016-02-12 NOTE — Progress Notes (Signed)
Dr. Melba Coon in to see patient. She is aware that patient refused am lab draws and BP checks. Will monitor.

## 2016-02-12 NOTE — Progress Notes (Signed)
Pt refused CBC this morning and 5 am vitals.
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
## 2016-02-13 MED ORDER — PRENATAL MULTIVITAMIN CH: 1.0000 | ORAL_TABLET | Freq: Every day | ORAL | Status: DC

## 2016-02-13 MED ORDER — LORAZEPAM 1 MG PO TABS
ORAL_TABLET | ORAL | Status: AC
  Filled 2016-02-13: qty 1

## 2016-02-13 MED ORDER — LORAZEPAM 1 MG PO TABS
0.5000 mg | ORAL_TABLET | Freq: Two times a day (BID) | ORAL | Status: DC | PRN
  Administered 2016-02-13 (×2): 0.5 mg via ORAL
  Filled 2016-02-13: qty 1

## 2016-02-13 MED ORDER — IBUPROFEN 800 MG PO TABS: 800.0000 mg | ORAL_TABLET | Freq: Four times a day (QID) | ORAL | Status: DC | PRN

## 2016-02-13 NOTE — Care Management Note (Signed)
Case Management Note  Additional Comments: CM on unit spoke to Mickel Baas - patient's nurse and she stated that patient plans to be discharged tonight and that at this point there is no need for patient staying an additional night. Please call CM for any additional needs.   Yong Channel, RN 02/13/2016, 4:36 PM

## 2016-02-13 NOTE — Discharge Summary (Signed)
OB Discharge Summary     Patient Name: [name] DOB: Jul 01, 1986 MRN: [id number]  Date of admission: 02/09/2016 Delivering MD: Janyth Contes   Date of discharge: 02/13/2016  Admitting diagnosis: 38 WKS, PRESSURE Intrauterine pregnancy: [redacted]w[redacted]d     Secondary diagnosis:  Principal Problem:   SVD (spontaneous vaginal delivery) Active Problems:   Labor and delivery indication for care or intervention   Mild preeclampsia  Additional problems: pychiatric history     Discharge diagnosis: Term Pregnancy Delivered and Preeclampsia (mild)                                                                                                Post partum procedures:none  Augmentation: AROM and Pitocin  Complications: None  Hospital course:  Induction of Labor With Vaginal Delivery   30 y.o. yo G1P1001 at [redacted]w[redacted]d was admitted to the hospital 02/09/2016 for induction of labor.  Indication for induction: Preeclampsia.  Patient had an uncomplicated labor course as follows: Membrane Rupture Time/Date: 9:16 PM ,02/09/2016   Intrapartum Procedures: Episiotomy: None [1]                                         Lacerations:  1st degree [2];Labial [10]  Patient had delivery of a Viable infant.  Information for the patient's newborn:  Jailin, Kloeppel Girl Shontia [id number]  Delivery Method: Vaginal, Spontaneous Delivery (Filed from Delivery Summary)   02/11/2016  Details of delivery can be found in separate delivery note.  Patient had a routine postpartum course. Patient is discharged home 02/13/2016.   Physical exam  Filed Vitals:   02/12/16 0931 02/12/16 1742 02/13/16 0100 02/13/16 0630  BP: 144/92 140/90 132/72 126/75  Pulse: 68 87 83 79  Temp: 98.3 F (36.8 C) 98.1 F (36.7 C)  98.2 F (36.8 C)  TempSrc: Oral Oral    Resp: 18 18 20 18   Height:      Weight:      SpO2: 98%      General: alert and no distress Lochia: appropriate Uterine Fundus: firm  Labs: Lab Results  Component  Value Date   WBC 14.4* 02/11/2016   HGB 9.9* 02/11/2016   HCT 31.0* 02/11/2016   MCV 87.6 02/11/2016   PLT 140* 02/11/2016   CMP Latest Ref Rng 02/09/2016  Glucose 65 - 99 mg/dL 117(H)  BUN 6 - 20 mg/dL 9  Creatinine 0.44 - 1.00 mg/dL 0.95  Sodium 135 - 145 mmol/L 139  Potassium 3.5 - 5.1 mmol/L 4.4  Chloride 101 - 111 mmol/L 107  CO2 22 - 32 mmol/L 23  Calcium 8.9 - 10.3 mg/dL 8.8(L)  Total Protein 6.5 - 8.1 g/dL 6.6  Total Bilirubin 0.3 - 1.2 mg/dL 0.8  Alkaline Phos 38 - 126 U/L 221(H)  AST 15 - 41 U/L 27  ALT 14 - 54 U/L 29    Discharge instruction: per After Visit Summary and "Baby and Me Booklet".  After visit meds:    Medication List    TAKE  these medications        acetaminophen 500 MG tablet  Commonly known as:  TYLENOL  Take 1,000 mg by mouth every 6 (six) hours as needed for mild pain or headache.     ibuprofen 800 MG tablet  Commonly known as:  ADVIL,MOTRIN  Take 1 tablet (800 mg total) by mouth every 6 (six) hours as needed for moderate pain.     prenatal multivitamin Tabs tablet  Take 1 tablet by mouth at bedtime.     promethazine 12.5 MG tablet  Commonly known as:  PHENERGAN  Take 1 tablet (12.5 mg total) by mouth every 6 (six) hours as needed for nausea.        Diet: routine diet  Activity: Advance as tolerated. Pelvic rest for 6 weeks.   Outpatient follow up:6 weeks Follow up Appt:No future appointments. Follow up Visit:No Follow-up on file.  Postpartum contraception: Nexplanon  Newborn Data: Live born female  Birth Weight: 8 lb 2.6 oz (3702 g) APGAR: 9, 9  Baby Feeding: Bottle Disposition:home with mother (CPS, ACT team involvement)  02/13/2016 Janyth Contes, MD

## 2016-02-13 NOTE — Progress Notes (Signed)
Post Partum Day 2 Subjective: no complaints, up ad lib, voiding, tolerating PO and nl lochia, pain controlled.  some anxiety overnight.    Objective: Blood pressure 126/75, pulse 79, temperature 98.2 F (36.8 C), temperature source Oral, resp. rate 18, height 5\' 7"  (1.702 m), weight 115.667 kg (255 lb), last menstrual period 05/12/2015, SpO2 98 %, unknown if currently breastfeeding.  Physical Exam:  General: alert and no distress Lochia: appropriate Uterine Fundus: firm   Recent Labs  02/11/16 0512  HGB 9.9*  HCT 31.0*    Assessment/Plan: Discharge home after CPS eval - possible.  ACT team to be involved.     LOS: 4 days   [name], Nichole Bernabe 02/13/2016, 7:38 AM

## 2016-02-13 NOTE — Progress Notes (Signed)
Patient ID: [name], female   DOB: 1986-04-06, 30 y.o.   MRN: [id number]   CPS cleared for release ACTT team has cleared. Peds has cleared  Will d/c to home

## 2016-02-13 NOTE — Progress Notes (Signed)
Patient teary eyed and anxious after social work visit. Patient refused all meds except for ativan.

## 2016-02-13 NOTE — Progress Notes (Signed)
Case management called to see if patient could stay another night if needed per Dr. Melba Coon. CPS here now to evaluate. Patient still teary eyed.

## 2016-02-13 NOTE — Progress Notes (Addendum)
CSW reviewed notes from RN from evening of 2/28.    CSW informed that CPS report has been accepted and assigned to A. [place]. CPS to arrive at the hospital prior to discharge to initiate assessment.   CSW to remain in contact with CPS in order to receive discharge recommendations for [name] and the infant.   Update at 10:00am: CSW has left a message for CPS in attempt to provide update.     CSW consulted with RN in order to receive additional information on status of [name] over night.   CSW followed up with [name] and [name]. [name] and [name] confirmed that the previous evening triggered acute anxiety for [name]. [name] stated that she was tearful and overwhelmed since the infant would not stop crying. [name] identified the infant's crying as the primary trigger for her anxiety.    [name] and [name] confirmed that a member of [name]'s ACTT team arrived at the hospital on 2/28 to meet with them. They stated that they have [name]'s home medications that she can re-start once she discharged from the hospital. [name] verbalized understanding that they cannot take home medications here at the hospital, but wanted them available to her once she is at home.  [name] and [name] stated that a member of the ACTT team will follow up with [name] the same day that she is discharged home.   CSW informed them of CPS report, and the need for a CPS evaluation prior to discharge.  [name] expressed intense fear that CPS will "take her away from me".  [name] asked numerous times if she will be allowed to parent the infant.  CSW acknowledged her fear, but continued to assist the [name] to identify and process the evidence that demonstrates that she is addressing her mental health and is putting forth effort to be a good mother.  [name] struggled to identify her personal strengths without assistance from CSW and [name].    Update at 2:45pm:  CPS supervisor at the hospital to initiate assessment.  CPS supervisor reported that she also currently has a worker at their home to complete  home study.  CSW provided update to CPS supervisor regarding concerns.   Update at 4:00pm:   CSW met with CPS supervisor after she met with [name] and [name]. CPS supervisor reported that the worker was able to evaluate the home. She stated that there are concerns about the home environment, but is sufficient for discharge. CPS worker provided list of requirements that will need to be completed in order to clean up the home environment.  CPS supervisor acknowledged concerns about cognitive delays and mental health concerns, and stated that CPS will continue to be closely involved. Per CPS supervisor, a CPS worker will meet with the family on 3/3 in their home. CPS stated that the family has signed a safety plan, and is agreement to continue to work with CPS.    CPS reported that the infant is able to be discharged to the care of the [name] and [name] when medically ready. No barriers to discharge.

## 2016-02-13 NOTE — Progress Notes (Signed)
[name] alerted RN that mom needs to be assessed and reevaluated by nurse because mom is becoming "teary-eyed" and very agitated and anxious. Mom is upset that baby is crying too often and mom "feels like she can't get her to stop - despite feeding often (every 1-2 hours) and changing her diaper frequently". The more mom gets agitated, the more dad seem anxious, too. Dad requested of RN to give her "home anxiety meds". MD notified for dose to help mom with this episode of anxiety / agitation. Med order received. MD requests no mother or newborn discharge in AM until CPS evaluation completed. Med given by RN, as ordered - will monitor closely. Nursery RN notified of hold on baby discharge - pending CPS visit.

## 2016-02-28 ENCOUNTER — Telehealth: Payer: Self-pay | Admitting: Critical Care Medicine

## 2016-02-28 NOTE — Telephone Encounter (Signed)
LMOMTCB x1 I do not see where pt has ever been seen in our office

## 2016-02-28 NOTE — Telephone Encounter (Signed)
This was the wrong pt. She gave me the wrong dob  My error disreguard this opened new msg on right pt.[name]  ERROR.[name]

## 2016-03-12 DIAGNOSIS — Z7251 High risk heterosexual behavior: Secondary | ICD-10-CM | POA: Diagnosis not present

## 2016-03-12 DIAGNOSIS — Z3200 Encounter for pregnancy test, result unknown: Secondary | ICD-10-CM | POA: Diagnosis not present

## 2016-03-12 DIAGNOSIS — Z3201 Encounter for pregnancy test, result positive: Secondary | ICD-10-CM | POA: Diagnosis not present

## 2016-03-21 DIAGNOSIS — Z1389 Encounter for screening for other disorder: Secondary | ICD-10-CM | POA: Diagnosis not present

## 2016-03-21 DIAGNOSIS — Z3009 Encounter for other general counseling and advice on contraception: Secondary | ICD-10-CM | POA: Diagnosis not present

## 2016-03-29 ENCOUNTER — Inpatient Hospital Stay (HOSPITAL_COMMUNITY)
Admission: AD | Admit: 2016-03-29 | Discharge: 2016-03-29 | Disposition: A | Discharge status: Home / Self Care | Payer: Medicare Other | Source: Ambulatory Visit | Attending: Obstetrics and Gynecology | Admitting: Obstetrics and Gynecology

## 2016-03-29 DIAGNOSIS — F319 Bipolar disorder, unspecified: Secondary | ICD-10-CM | POA: Diagnosis not present

## 2016-03-29 DIAGNOSIS — Z048 Encounter for examination and observation for other specified reasons: Secondary | ICD-10-CM | POA: Diagnosis not present

## 2016-03-29 DIAGNOSIS — Z3202 Encounter for pregnancy test, result negative: Secondary | ICD-10-CM | POA: Diagnosis not present

## 2016-03-29 DIAGNOSIS — F419 Anxiety disorder, unspecified: Secondary | ICD-10-CM | POA: Insufficient documentation

## 2016-03-29 DIAGNOSIS — F909 Attention-deficit hyperactivity disorder, unspecified type: Secondary | ICD-10-CM | POA: Insufficient documentation

## 2016-03-29 DIAGNOSIS — J45909 Unspecified asthma, uncomplicated: Secondary | ICD-10-CM | POA: Insufficient documentation

## 2016-03-29 DIAGNOSIS — Z79899 Other long term (current) drug therapy: Secondary | ICD-10-CM | POA: Insufficient documentation

## 2016-03-29 DIAGNOSIS — K219 Gastro-esophageal reflux disease without esophagitis: Secondary | ICD-10-CM | POA: Diagnosis not present

## 2016-03-29 DIAGNOSIS — Z3200 Encounter for pregnancy test, result unknown: Secondary | ICD-10-CM

## 2016-03-29 DIAGNOSIS — A630 Anogenital (venereal) warts: Secondary | ICD-10-CM | POA: Diagnosis not present

## 2016-03-29 NOTE — MAU Note (Signed)
Here to get pregnancy test to confirm pregnancy, needs to know. LMP 03/24/16

## 2016-03-29 NOTE — Discharge Instructions (Signed)
Pregnancy Test Information °WHAT IS A PREGNANCY TEST? °A pregnancy test is used to detect the presence of human chorionic gonadotropin (hCG) in a sample of your urine or blood. hCG is a hormone produced by the cells of the placenta. The placenta is the organ that forms to nourish and support a developing baby. °This test requires a sample of either blood or urine. A pregnancy test determines whether you are pregnant or not. °HOW ARE PREGNANCY TESTS DONE? °Pregnancy tests are done using a home pregnancy test or having a blood or urine test done at your health care provider's office.  °Home pregnancy tests require a urine sample. °· Most kits use a plastic testing device with a strip of paper that indicates whether there is hCG in your urine. °· Follow the test instructions very carefully. °· After you urinate on the test stick, markings will appear to let you know whether you are pregnant. °· For best results, use your first urine of the morning. That is when the concentration of hCG is highest. °Having a blood test to check for pregnancy requires a sample of blood drawn from a vein in your hand or arm. Your health care provider will send your sample to a lab for testing. Results of a pregnancy test will be positive or negative. °IS ONE TYPE OF PREGNANCY TEST BETTER THAN ANOTHER? °In some cases, a blood test will return a positive result even if a urine test was negative because blood tests are more sensitive. This means blood tests can detect hCG earlier than home pregnancy tests.  °HOW ACCURATE ARE HOME PREGNANCY TESTS?  °Both types of pregnancy tests are very accurate. °· A blood test is about 98% accurate. °· When you are far enough along in your pregnancy and when used correctly, home pregnancy tests are equally accurate. °CAN ANYTHING INTERFERE WITH HOME PREGNANCY TEST RESULTS?  °It is possible for certain conditions to cause an inaccurate test result (false positive or false negative). °· A false positive is a  positive test result when you are not pregnant. This can happen if you: °¨ Are taking certain medicines, including anticonvulsants or tranquilizers. °¨ Have certain proteins in your blood. °· A false negative is a negative test result when you are pregnant. This can happen if you: °¨ Took the test before there was enough hCG to detect. A pregnancy test will not be positive in most women until 3-4 weeks after conception. °¨ Drank a lot of liquid before the test. Diluted urine samples can sometimes give an inaccurate result. °¨ Take certain medicines, such as water pills (diuretics) or some antihistamines. °WHAT SHOULD I DO IF I HAVE A POSITIVE PREGNANCY TEST? °If you have a positive pregnancy test, schedule an appointment with your health care provider. You might need additional testing to confirm the pregnancy. In the meantime, begin taking a prenatal vitamin, stop smoking, stop drinking alcohol, and do not use street drugs. °Talk to your health care provider about how to take care of yourself during your pregnancy. Ask about what to expect from the care you will need throughout pregnancy (prenatal care). °  °This information is not intended to replace advice given to you by your health care provider. Make sure you discuss any questions you have with your health care provider. °  °Document Released: 12/04/2003 Document Revised: 12/22/2014 Document Reviewed: 03/28/2014 °Elsevier Interactive Patient Education ©2016 [employer] ° °

## 2016-03-29 NOTE — MAU Provider Note (Signed)
History   [id number]   No chief complaint on file.   HPI [name] is a 30 y.o. female G1P1001 here here for confirmation of pregnancy.   Patient's last menstrual period was 03/24/2016.[name] Denies any pelvic pain or continued bleeding.   All other systems within normal limits.  Desires pregnancy.    Patient's last menstrual period was 03/24/2016.  OB History  Gravida Para Term Preterm AB SAB TAB Ectopic Multiple Living  1 1 1       0 1    # Outcome Date GA Lbr Len/2nd Weight Sex Delivery Anes PTL Lv  1 Term 02/11/16 [redacted]w[redacted]d / 01:14 8 lb 2.6 oz (3.702 kg) F Vag-Spont EPI  Y      Past Medical History  Diagnosis Date  . Anxiety   . UTI (urinary tract infection)     Completed Cipro 08/06/12  . Gastritis   . Hx of varicella   . GERD (gastroesophageal reflux disease)   . Bipolar disorder [facility])     hospitalized as a teen for suicidal ideations and cutting  . Attention deficit hyperactivity disorder   . Depression   . HPV (human papilloma virus) anogenital infection   . Vaginal Pap smear, abnormal   . Asthma     inhaler PRN-hasn't used inhaler in months  . Mild preeclampsia 02/09/2016  . SVD (spontaneous vaginal delivery) 02/11/2016    Family History  Problem Relation Age of Onset  . Depression Mother   . Depression Father   . Depression Brother   . Colon cancer Mother   . Colon polyps Mother   . Liver disease    . Kidney disease      Social History   Social History  . Marital Status: Married    Spouse Name: N/A  . Number of Children: 0  . Years of Education: N/A   Social History Main Topics  . Smoking status: Never Smoker   . Smokeless tobacco: Never Used  . Alcohol Use: No  . Drug Use: No  . Sexual Activity: Yes    Birth Control/ Protection: None   Other Topics Concern  . Not on file   Social History Narrative    Allergies  Allergen Reactions  . Methylphenidate Derivatives Other (See Comments)    Reaction:  Depression and anger   . Neurontin  [Gabapentin] Other (See Comments)    Reaction:  Dizziness   . Prozac [Fluoxetine Hcl] Other (See Comments)    Reaction:  Anger     No current facility-administered medications on file prior to encounter.   Current Outpatient Prescriptions on File Prior to Encounter  Medication Sig Dispense Refill  . acetaminophen (TYLENOL) 500 MG tablet Take 1,000 mg by mouth every 6 (six) hours as needed for mild pain or headache.    . ibuprofen (ADVIL,MOTRIN) 800 MG tablet Take 1 tablet (800 mg total) by mouth every 6 (six) hours as needed for moderate pain. 30 tablet 1  . Prenatal Vit-Fe Fumarate-FA (PRENATAL MULTIVITAMIN) TABS tablet Take 1 tablet by mouth at bedtime. 30 tablet 12  . promethazine (PHENERGAN) 12.5 MG tablet Take 1 tablet (12.5 mg total) by mouth every 6 (six) hours as needed for nausea. 15 tablet 0     Physical Exam   Filed Vitals:   03/29/16 1757  BP: 101/76  Pulse: 95  Temp: 98.2 F (36.8 C)  TempSrc: Oral  Resp: 18    Physical Exam  Constitutional: She is oriented to person, place, and time. She  appears well-developed and well-nourished. No distress.  HENT:  Head: Normocephalic.  Neck: Neck supple.  Respiratory: Effort normal and breath sounds normal.  Neurological: She is alert and oriented to person, place, and time. She has normal reflexes.  Skin: Skin is warm and dry.  Psychiatric: She has a normal mood and affect.    MAU Course  Procedures   Assessment and Plan  Desires Pregnancy  Explained too early for pregnancy test. Given information regarding [facility] hours for pregnancy test in two weeks   Gwen Pounds, CNM 03/29/2016 6:15 PM

## 2016-04-04 ENCOUNTER — Encounter: Payer: Medicare Other | Admitting: Internal Medicine

## 2016-04-04 ENCOUNTER — Telehealth: Payer: Self-pay

## 2016-04-04 NOTE — Telephone Encounter (Signed)
Pt called requesting to be seen today- states she has been feeling "really bad," having abdominal pain and white vaginal discharge. Only available appointment is 215, pt couldn't confirm she could get here but insisted to be added to schedule and will call to cancel.

## 2016-04-09 ENCOUNTER — Ambulatory Visit (HOSPITAL_COMMUNITY)
Admission: EM | Admit: 2016-04-09 | Discharge: 2016-04-09 | Discharge status: Against Medical Advice | Payer: Medicare Other | Attending: Emergency Medicine | Admitting: Emergency Medicine

## 2016-04-09 DIAGNOSIS — Z3202 Encounter for pregnancy test, result negative: Secondary | ICD-10-CM | POA: Diagnosis not present

## 2016-04-09 LAB — POCT PREGNANCY, URINE: Preg Test, Ur: NEGATIVE

## 2016-04-09 NOTE — ED Notes (Signed)
Pt just wanted to know if she was pregnant and when she was told her pregnancy test was negative, she decided to leave without being seen.

## 2016-04-09 NOTE — ED Notes (Signed)
Specimen obtained while patient in waiting room

## 2016-05-23 ENCOUNTER — Ambulatory Visit (INDEPENDENT_AMBULATORY_CARE_PROVIDER_SITE_OTHER): Payer: Medicare Other | Admitting: Internal Medicine

## 2016-05-23 VITALS — BP 120/80 | HR 90 | Temp 98.5°F | Ht 66.0 in

## 2016-05-23 DIAGNOSIS — Z3200 Encounter for pregnancy test, result unknown: Secondary | ICD-10-CM

## 2016-05-23 DIAGNOSIS — J029 Acute pharyngitis, unspecified: Secondary | ICD-10-CM | POA: Diagnosis not present

## 2016-05-23 LAB — POCT URINE PREGNANCY: Preg Test, Ur: NEGATIVE

## 2016-05-23 NOTE — Patient Instructions (Signed)
Please drink plenty of water. Take tylenol or ibuprofen for sore throat/pain.  If you have fever more than 100.4 please call us.

## 2016-05-23 NOTE — Progress Notes (Signed)
   Subjective:    Patient ID: [name], female    DOB: 01-01-1986, 30 y.o.   MRN: [id number]  HPI  30 yo female with hx of preeclampsia, OSA, GERD, Bipolar disorder, substance abuse, previous STIs, recent vaginal deliery on 02/09/2016, here for acute complaint of sore throat.  Sore throat for 1 day. Feeling poorly. No ear pain, no lymphadenopathies, no rash. No fever. Has mild coughing, non productive.  Had strep pharyngitis on 12/14. Husband has strep throat and getting abx now.   Wants to do preg test, does not remember her LMP.  Review of Systems  Constitutional: Negative for fever and chills.  HENT: Positive for sore throat. Negative for congestion, rhinorrhea and sneezing.   Eyes: Negative for photophobia and visual disturbance.  Respiratory: Positive for cough. Negative for chest tightness, shortness of breath and wheezing.   Cardiovascular: Negative for chest pain, palpitations and leg swelling.  Gastrointestinal: Negative for abdominal pain and abdominal distention.       Objective:   Physical Exam  Constitutional: She is oriented to person, place, and time. She appears well-developed and well-nourished. No distress.  HENT:  Head: Normocephalic and atraumatic.  No exudates or erythema.   Eyes: Conjunctivae are normal. Right eye exhibits no discharge. Left eye exhibits no discharge. No scleral icterus.  Neck: Normal range of motion.  No adenopathies.   Cardiovascular: Normal rate and regular rhythm.  Exam reveals no gallop and no friction rub.   No murmur heard. Pulmonary/Chest: Effort normal and breath sounds normal. No respiratory distress. She has no wheezes.  Abdominal: Soft. Bowel sounds are normal.  Musculoskeletal: Normal range of motion. She exhibits no edema or tenderness.  Neurological: She is alert and oriented to person, place, and time.  Skin: She is not diaphoretic.    Filed Vitals:   05/23/16 1611 05/23/16 1613  BP: 113/84 120/80  Pulse: 109  86  Temp: 98.5 F (36.9 C)          Assessment & Plan:  See problem based a&p

## 2016-05-23 NOTE — Assessment & Plan Note (Addendum)
Wants to do pregnancy test. Does not know her LMP. Desiring to be pregnant.   Test was negative.

## 2016-05-23 NOTE — Assessment & Plan Note (Signed)
Having sore throat, no lymphadenopathy, no exudate, no fever, has some cough. Centaur criteria is zero. I still wanted to offer her Rapid strep throat test for reassurance as her husband has strep throat. However, patient declined to do the test.  I think this is almost surely not strep pharyngitis. This is likely viral. - asked her to drink plenty of fluid and to take tylenol as needed for pain - if having fever >100.4, asked to RTC.

## 2016-05-24 ENCOUNTER — Emergency Department (HOSPITAL_COMMUNITY)
Admission: EM | Admit: 2016-05-24 | Discharge: 2016-05-24 | Disposition: A | Discharge status: Home / Self Care | Payer: Medicare Other | Attending: Emergency Medicine | Admitting: Emergency Medicine

## 2016-05-24 ENCOUNTER — Encounter (HOSPITAL_COMMUNITY): Payer: Self-pay | Admitting: Emergency Medicine

## 2016-05-24 DIAGNOSIS — J029 Acute pharyngitis, unspecified: Secondary | ICD-10-CM | POA: Diagnosis not present

## 2016-05-24 DIAGNOSIS — F319 Bipolar disorder, unspecified: Secondary | ICD-10-CM | POA: Insufficient documentation

## 2016-05-24 DIAGNOSIS — R05 Cough: Secondary | ICD-10-CM | POA: Diagnosis present

## 2016-05-24 DIAGNOSIS — Z79899 Other long term (current) drug therapy: Secondary | ICD-10-CM | POA: Insufficient documentation

## 2016-05-24 DIAGNOSIS — R112 Nausea with vomiting, unspecified: Secondary | ICD-10-CM | POA: Diagnosis not present

## 2016-05-24 DIAGNOSIS — J45909 Unspecified asthma, uncomplicated: Secondary | ICD-10-CM | POA: Diagnosis not present

## 2016-05-24 DIAGNOSIS — H6092 Unspecified otitis externa, left ear: Secondary | ICD-10-CM | POA: Diagnosis not present

## 2016-05-24 DIAGNOSIS — R0789 Other chest pain: Secondary | ICD-10-CM | POA: Diagnosis not present

## 2016-05-24 LAB — RAPID STREP SCREEN (MED CTR MEBANE ONLY): Streptococcus, Group A Screen (Direct): NEGATIVE

## 2016-05-24 MED ORDER — CIPROFLOXACIN-DEXAMETHASONE 0.3-0.1 % OT SUSP
4.0000 [drp] | Freq: Two times a day (BID) | OTIC | Status: DC
  Administered 2016-05-24: 4 [drp] via OTIC
  Filled 2016-05-24: qty 7.5

## 2016-05-24 MED ORDER — NAPROXEN 500 MG PO TABS: 500.0000 mg | ORAL_TABLET | Freq: Two times a day (BID) | ORAL | Status: DC

## 2016-05-24 MED ORDER — NAPROXEN 500 MG PO TABS
500.0000 mg | ORAL_TABLET | Freq: Once | ORAL | Status: AC
  Administered 2016-05-24: 500 mg via ORAL
  Filled 2016-05-24: qty 1

## 2016-05-24 NOTE — Discharge Instructions (Signed)
Please read and follow all provided instructions.  Your diagnoses today include:  1. Acute pharyngitis, unspecified etiology   2. Otitis externa, left     Tests performed today include:  Strep test: was negative for strep throat  Strep culture: you will be notified if this comes back positive  Vital signs. See below for your results today.   Medications prescribed:   Naproxen - anti-inflammatory pain medication  Do not exceed 500mg  naproxen every 12 hours, take with food  You have been prescribed an anti-inflammatory medication or NSAID. Take with food. Take smallest effective dose for the shortest duration needed for your pain. Stop taking if you experience stomach pain or vomiting.    Ciprodex ear drops - instil 4 drops into affected ear twice daily for 7 days  Home care instructions:  Please read the educational materials provided and follow any instructions contained in this packet.  Follow-up instructions: Please follow-up with your primary care provider as needed for further evaluation of your symptoms.  Return instructions:   Please return to the Emergency Department if you experience worsening symptoms.   Return if you have worsening problems swallowing, your neck becomes swollen, you cannot swallow your saliva or your voice becomes muffled.   Return with high persistent fever, persistent vomiting, or if you have trouble breathing.   Please return if you have any other emergent concerns.  Additional Information:  Your vital signs today were: BP    Pulse 103   Temp(Src) 98.7 F (37.1 C) (Oral)   Resp 18   SpO2 100% If your blood pressure (BP) was elevated above 135/85 this visit, please have this repeated by your doctor within one month. --------------

## 2016-05-24 NOTE — ED Notes (Addendum)
Pt from home via EMS with complaints of nausea with 2 episodes of emesis along with productive cough and ear pain. Pt states this has been going on x 2 days. Pt denies diarrhea and urinary symptoms. Pt states she went to the pool earlier today. Pt is refusing to have her blood pressure taken

## 2016-05-24 NOTE — ED Notes (Signed)
Pt refused blood pressure

## 2016-05-24 NOTE — ED Provider Notes (Signed)
CSN: [id number]     Arrival date & time 05/24/16  1837 History   First MD Initiated Contact with Patient 05/24/16 2003     Chief Complaint  Patient presents with  . Emesis  . Cough  . Sore Throat     (Consider location/radiation/quality/duration/timing/severity/associated sxs/prior Treatment) HPI Comments: Patient presents with constellation of symptoms including cough, nausea and vomiting, sore throat for the past 2 days. Patient states that she has had left ear pain starting upon arrival to the emergency department. Patient was seen by her primary care physician yesterday who diagnosed viral pharyngitis. She refused rapid strep at that time. Her husband states he was recently diagnosed with strep throat. Patient also had a negative pregnancy test at that time. Patient denies chest pain, abdominal pain, diarrhea, dysuria or hematuria. Patient states that she was at a swimming pool or earlier today. No treatments prior to arrival.  Patient is a 30 y.o. female presenting with vomiting, cough, and pharyngitis. The history is provided by the patient.  Emesis Associated symptoms: sore throat   Associated symptoms: no abdominal pain, no chills, no diarrhea, no headaches and no myalgias   Cough Associated symptoms: ear pain and sore throat   Associated symptoms: no chills, no fever, no headaches, no myalgias, no rash, no rhinorrhea and no wheezing   Sore Throat Associated symptoms include coughing, nausea, a sore throat and vomiting. Pertinent negatives include no abdominal pain, chills, congestion, fatigue, fever, headaches, myalgias or rash.    Past Medical History  Diagnosis Date  . Anxiety   . UTI (urinary tract infection)     Completed Cipro 08/06/12  . Gastritis   . Hx of varicella   . GERD (gastroesophageal reflux disease)   . Bipolar disorder [facility]     hospitalized as a teen for suicidal ideations and cutting  . Attention deficit hyperactivity disorder   . Depression   . HPV  (human papilloma virus) anogenital infection   . Vaginal Pap smear, abnormal   . Asthma     inhaler PRN-hasn't used inhaler in months  . Mild preeclampsia 02/09/2016  . SVD (spontaneous vaginal delivery) 02/11/2016   Past Surgical History  Procedure Laterality Date  . Mouth surgery    . Eye muscle surgery Bilateral     07/29/12  . Plantar fascia surgery     Family History  Problem Relation Age of Onset  . Depression Mother   . Depression Father   . Depression Brother   . Colon cancer Mother   . Colon polyps Mother   . Liver disease    . Kidney disease     Social History  Substance Use Topics  . Smoking status: Never Smoker   . Smokeless tobacco: Never Used  . Alcohol Use: No   OB History    Gravida Para Term Preterm AB TAB SAB Ectopic Multiple Living   1 1 1       0 1     Review of Systems  Constitutional: Negative for fever, chills and fatigue.  HENT: Positive for ear pain and sore throat. Negative for congestion, rhinorrhea and sinus pressure.   Eyes: Negative for redness.  Respiratory: Positive for cough. Negative for wheezing.   Gastrointestinal: Positive for nausea and vomiting. Negative for abdominal pain and diarrhea.  Genitourinary: Negative for dysuria.  Musculoskeletal: Negative for myalgias and neck stiffness.  Skin: Negative for rash.  Neurological: Negative for headaches.  Hematological: Negative for adenopathy.      Allergies  Methylphenidate  derivatives; Neurontin; and Prozac  Home Medications   Prior to Admission medications   Medication Sig Start Date End Date Taking? Authorizing Provider  acetaminophen (TYLENOL) 500 MG tablet Take 1,000 mg by mouth every 6 (six) hours as needed for mild pain or headache.   Yes Historical Provider, MD  naproxen (NAPROSYN) 500 MG tablet Take 1 tablet (500 mg total) by mouth 2 (two) times daily. 05/24/16   Carlisle Cater, PA-C   BP   Pulse 103  Temp(Src) 98.7 F (37.1 C) (Oral)  Resp 18  SpO2 100%   Physical  Exam  Constitutional: She appears well-developed and well-nourished.  HENT:  Head: Normocephalic and atraumatic.  Right Ear: Tympanic membrane, external ear and ear canal normal. No drainage. No mastoid tenderness. Tympanic membrane is not erythematous.  Left Ear: Tympanic membrane and external ear normal. No drainage. No mastoid tenderness. Decreased hearing is noted.  Nose: Nose normal. No mucosal edema or rhinorrhea.  Mouth/Throat: Uvula is midline and mucous membranes are normal. Mucous membranes are not dry. No oral lesions. No trismus in the jaw. No uvula swelling. Posterior oropharyngeal erythema present. No oropharyngeal exudate, posterior oropharyngeal edema or tonsillar abscesses.  Tenderness with tugging on left pinna. Cerumen impaction L ear canal.   Eyes: Conjunctivae are normal. Right eye exhibits no discharge. Left eye exhibits no discharge.  Neck: Normal range of motion. Neck supple.  Cardiovascular: Normal rate, regular rhythm and normal heart sounds.   Pulmonary/Chest: Effort normal and breath sounds normal. No respiratory distress. She has no wheezes. She has no rales.  Abdominal: Soft. There is no tenderness.  Lymphadenopathy:    She has no cervical adenopathy.  Neurological: She is alert.  Skin: Skin is warm and dry.  Psychiatric: Her affect is labile.  Nursing note and vitals reviewed.   ED Course  Procedures (including critical care time) Labs Review Labs Reviewed  RAPID STREP SCREEN (NOT AT [facility])  CULTURE, GROUP A STREP [facility])   8:27 PM Patient seen and examined. Informed of neg strep. Given new ear pain, tenderness, cerumen impaction --will start on ciprodex for symptom relief.   Patient counseled on supportive care for viral URI and s/s to return including worsening symptoms, persistent fever, persistent vomiting, or if they have any other concerns. Urged to see PCP if symptoms persist for more than 3 days. Patient verbalizes understanding and agrees with  plan.   Vital signs reviewed and are as follows: BP   Pulse 103  Temp(Src) 98.7 F (37.1 C) (Oral)  Resp 18  SpO2 100%    MDM   Final diagnoses:  Acute pharyngitis, unspecified etiology  Otitis externa, left   Viral syndrome: strep neg, patient with symptoms consistent with a viral syndrome. Vitals are stable, no fever. No signs of dehydration. Lung exam normal, no signs of pneumonia. Supportive therapy indicated with return if symptoms worsen.    Ear pain: new onset tenderness and pain, given recent swimming pool use, will treat as otitis externa. This should also help soften wax within the ear canal.     Carlisle Cater, PA-C 05/24/16 2029  Isla Pence, MD 05/25/16 [phone]

## 2016-05-26 NOTE — Progress Notes (Signed)
Internal Medicine Clinic Attending  Case discussed with Dr. Ahmed at the time of the visit.  We reviewed the resident's history and exam and pertinent patient test results.  I agree with the assessment, diagnosis, and plan of care documented in the resident's note. 

## 2016-05-27 LAB — CULTURE, GROUP A STREP (THRC)

## 2016-06-25 DIAGNOSIS — Z3201 Encounter for pregnancy test, result positive: Secondary | ICD-10-CM | POA: Diagnosis not present

## 2016-06-26 ENCOUNTER — Inpatient Hospital Stay (HOSPITAL_COMMUNITY)
Admission: AD | Admit: 2016-06-26 | Discharge: 2016-06-26 | Disposition: A | Discharge status: Home / Self Care | Payer: Medicare Other | Source: Ambulatory Visit | Attending: Obstetrics & Gynecology | Admitting: Obstetrics & Gynecology

## 2016-06-26 ENCOUNTER — Encounter: Payer: Self-pay | Admitting: Family Medicine

## 2016-06-26 ENCOUNTER — Ambulatory Visit (INDEPENDENT_AMBULATORY_CARE_PROVIDER_SITE_OTHER): Payer: Medicare Other

## 2016-06-26 DIAGNOSIS — O99211 Obesity complicating pregnancy, first trimester: Secondary | ICD-10-CM | POA: Insufficient documentation

## 2016-06-26 DIAGNOSIS — Z3201 Encounter for pregnancy test, result positive: Secondary | ICD-10-CM | POA: Diagnosis not present

## 2016-06-26 DIAGNOSIS — Z3A01 Less than 8 weeks gestation of pregnancy: Secondary | ICD-10-CM | POA: Insufficient documentation

## 2016-06-26 DIAGNOSIS — Z6834 Body mass index (BMI) 34.0-34.9, adult: Secondary | ICD-10-CM | POA: Diagnosis not present

## 2016-06-26 DIAGNOSIS — Z139 Encounter for screening, unspecified: Secondary | ICD-10-CM

## 2016-06-26 LAB — POCT PREGNANCY, URINE: Preg Test, Ur: POSITIVE — AB

## 2016-06-26 NOTE — MAU Provider Note (Signed)
[name] is a 30 y.o. G2P1001 @ approximately 6week 3 day gestation by LMP here for pregnancy verification letter. Patient had 4 positive pregnancy test at home. She denies pain or bleeding.   MSE completed and patient stable to go to the [facility] for pregnancy verification.  BP 132/82 mmHg  Pulse 79  Temp(Src) 98.7 F (37.1 C)  Resp 18  Ht 5\' 6"  (1.676 m)  Wt 215 lb (97.523 kg)  BMI 34.72 kg/m2  LMP 05/12/2016 (Approximate)   Patient will go to [facility] now for verification.

## 2016-06-26 NOTE — Progress Notes (Signed)
Patient ID: [name], female   DOB: 12-13-1986, 30 y.o.   MRN: [id number] Pt presents to [place] for [employer] for a pregnancy test, results were positive. Patient was in a hurry and stated "she dont remember when her LMP was maybe may 29th 2017?" Pt currently is on no medications and we reviewed her allergies. Pt currently has a 87month old child. Per her possible LMP she would be [redacted]w[redacted]d and EDD would be 02/16/17. Pt stated her ride was leaving and she had to go.

## 2016-06-26 NOTE — MAU Note (Signed)
Pt took 4 HPT and they all were positive. Just wants to know how far along she is. Denies any pain or problems.

## 2016-06-26 NOTE — MAU Note (Signed)
Medical screening done and pt directed to clinic for pregnancy confirmation.

## 2016-07-26 ENCOUNTER — Encounter: Payer: Self-pay | Admitting: *Deleted

## 2016-07-26 DIAGNOSIS — Z3490 Encounter for supervision of normal pregnancy, unspecified, unspecified trimester: Secondary | ICD-10-CM | POA: Insufficient documentation

## 2016-08-01 ENCOUNTER — Encounter (HOSPITAL_COMMUNITY): Payer: Self-pay

## 2016-08-01 ENCOUNTER — Inpatient Hospital Stay (HOSPITAL_COMMUNITY)
Admission: AD | Admit: 2016-08-01 | Discharge: 2016-08-01 | Disposition: A | Discharge status: Home / Self Care | Payer: Medicare Other | Source: Ambulatory Visit | Attending: Obstetrics & Gynecology | Admitting: Obstetrics & Gynecology

## 2016-08-01 DIAGNOSIS — Z3A09 9 weeks gestation of pregnancy: Secondary | ICD-10-CM | POA: Insufficient documentation

## 2016-08-01 DIAGNOSIS — O4691 Antepartum hemorrhage, unspecified, first trimester: Secondary | ICD-10-CM | POA: Diagnosis not present

## 2016-08-01 DIAGNOSIS — O9952 Diseases of the respiratory system complicating childbirth: Secondary | ICD-10-CM | POA: Insufficient documentation

## 2016-08-01 DIAGNOSIS — F419 Anxiety disorder, unspecified: Secondary | ICD-10-CM | POA: Insufficient documentation

## 2016-08-01 DIAGNOSIS — Z79899 Other long term (current) drug therapy: Secondary | ICD-10-CM | POA: Insufficient documentation

## 2016-08-01 DIAGNOSIS — R079 Chest pain, unspecified: Secondary | ICD-10-CM | POA: Insufficient documentation

## 2016-08-01 DIAGNOSIS — O99341 Other mental disorders complicating pregnancy, first trimester: Secondary | ICD-10-CM | POA: Insufficient documentation

## 2016-08-01 DIAGNOSIS — O209 Hemorrhage in early pregnancy, unspecified: Secondary | ICD-10-CM | POA: Diagnosis not present

## 2016-08-01 DIAGNOSIS — O9962 Diseases of the digestive system complicating childbirth: Secondary | ICD-10-CM | POA: Insufficient documentation

## 2016-08-01 DIAGNOSIS — J45909 Unspecified asthma, uncomplicated: Secondary | ICD-10-CM | POA: Insufficient documentation

## 2016-08-01 DIAGNOSIS — Z3481 Encounter for supervision of other normal pregnancy, first trimester: Secondary | ICD-10-CM

## 2016-08-01 DIAGNOSIS — O26891 Other specified pregnancy related conditions, first trimester: Secondary | ICD-10-CM | POA: Insufficient documentation

## 2016-08-01 DIAGNOSIS — F319 Bipolar disorder, unspecified: Secondary | ICD-10-CM | POA: Diagnosis not present

## 2016-08-01 DIAGNOSIS — K219 Gastro-esophageal reflux disease without esophagitis: Secondary | ICD-10-CM | POA: Diagnosis not present

## 2016-08-01 DIAGNOSIS — Z888 Allergy status to other drugs, medicaments and biological substances status: Secondary | ICD-10-CM | POA: Insufficient documentation

## 2016-08-01 DIAGNOSIS — Z3491 Encounter for supervision of normal pregnancy, unspecified, first trimester: Secondary | ICD-10-CM

## 2016-08-01 LAB — URINALYSIS, ROUTINE W REFLEX MICROSCOPIC
Bilirubin Urine: NEGATIVE
Glucose, UA: NEGATIVE mg/dL
Hgb urine dipstick: NEGATIVE
Ketones, ur: NEGATIVE mg/dL
Leukocytes, UA: NEGATIVE
Nitrite: NEGATIVE
Protein, ur: NEGATIVE mg/dL
Specific Gravity, Urine: 1.02 (ref 1.005–1.030)
pH: 7 (ref 5.0–8.0)

## 2016-08-01 MED ORDER — PREPLUS 27-1 MG PO TABS: 1.0000 | ORAL_TABLET | Freq: Every day | ORAL | 13 refills | Status: DC

## 2016-08-01 NOTE — MAU Note (Signed)
Bleeding started two days ago, one small clot. Also complaining of chest pain.

## 2016-08-01 NOTE — MAU Provider Note (Signed)
History     CSN: [id number]  Arrival date and time: 08/01/16 1210   None     Chief Complaint  Patient presents with  . Vaginal Bleeding  . Chest Pain   HPI 30 y.o. G2P1001 at [redacted]w[redacted]d [facility] with report of periodic spotting. No pain, no N/V. No other significant symptoms.  Obstetric History   G2   P1   T1   P0   A0   L1    SAB0   TAB0   Ectopic0   Multiple0   Live Births1     # Outcome Date GA Lbr Len/2nd Weight Sex Delivery Anes PTL Lv  2 Current           1 Term 02/11/16 [redacted]w[redacted]d / 01:14 8 lb 2.6 oz (3.702 kg) F Vag-Spont EPI  LIV     Name: Sesma,GIRL Tifini     Apgar1:  9                Apgar5: 9       Past Medical History:  Diagnosis Date  . Anxiety   . Asthma    inhaler PRN-hasn't used inhaler in months  . Attention deficit hyperactivity disorder   . Bipolar disorder [facility])    hospitalized as a teen for suicidal ideations and cutting  . Depression   . Gastritis   . GERD (gastroesophageal reflux disease)   . HPV (human papilloma virus) anogenital infection   . Hx of varicella   . Mild preeclampsia 02/09/2016  . SVD (spontaneous vaginal delivery) 02/11/2016  . UTI (urinary tract infection)    Completed Cipro 08/06/12  . Vaginal Pap smear, abnormal     Past Surgical History:  Procedure Laterality Date  . EYE MUSCLE SURGERY Bilateral    07/29/12  . MOUTH SURGERY    . PLANTAR FASCIA SURGERY      Family History  Problem Relation Age of Onset  . Depression Mother   . Colon cancer Mother   . Colon polyps Mother   . Depression Father   . Depression Brother   . Liver disease    . Kidney disease      Social History  Substance Use Topics  . Smoking status: Never Smoker  . Smokeless tobacco: Never Used  . Alcohol use No    Allergies:  Allergies  Allergen Reactions  . Methylphenidate Derivatives Other (See Comments)    Reaction:  Depression and anger   . Neurontin [Gabapentin] Other (See Comments)    Reaction:   Dizziness   . Prozac [Fluoxetine Hcl] Other (See Comments)    Reaction:  Anger     Prescriptions Prior to Admission  Medication Sig Dispense Refill Last Dose  . acetaminophen (TYLENOL) 500 MG tablet Take 1,000 mg by mouth every 6 (six) hours as needed for mild pain or headache. Reported on 06/26/2016   Not Taking  . naproxen (NAPROSYN) 500 MG tablet Take 1 tablet (500 mg total) by mouth 2 (two) times daily. (Patient not taking: Reported on 06/26/2016) 20 tablet 0 Not Taking    ROS Physical Exam   Blood pressure 113/72, pulse 72, temperature 98.8 F (37.1 C), temperature source Oral, resp. rate 16, last menstrual period 05/12/2016, unknown if currently breastfeeding.  Physical Exam  Constitutional: She appears well-developed and well-nourished.  HENT:  Head: Normocephalic and atraumatic.  Eyes: Conjunctivae and EOM are normal. Pupils are equal, round, and reactive to light.  Neck: Normal range of motion. Neck  supple.  Cardiovascular: Normal rate.   Respiratory: Effort normal and breath sounds normal.  GI: Soft. She exhibits no distension. There is no rebound.  Genitourinary: Vagina normal and uterus normal.  Genitourinary Comments: No evidence of bleeding    MAU Course  Procedures  MDM Ultrasound showed  [redacted]w[redacted]d viable fetus.  CRL measurements consistent with [redacted]w[redacted]d  Assessment and Plan  Patient reassured about viability No bleeding on exam New OB appointment already scheduled, prenatal vitamins prescribed   Saad Buhl A, MD 08/01/2016, 12:53 PM

## 2016-08-02 ENCOUNTER — Ambulatory Visit (HOSPITAL_COMMUNITY)
Admission: RE | Admit: 2016-08-02 | Discharge: 2016-08-02 | Disposition: A | Discharge status: Home / Self Care | Payer: 59 | Attending: Psychiatry | Admitting: Psychiatry

## 2016-08-02 ENCOUNTER — Encounter (HOSPITAL_COMMUNITY): Payer: Self-pay | Admitting: Emergency Medicine

## 2016-08-02 ENCOUNTER — Emergency Department (HOSPITAL_COMMUNITY)
Admission: EM | Admit: 2016-08-02 | Discharge: 2016-08-02 | Disposition: A | Discharge status: Home / Self Care | Payer: Medicare Other | Attending: Emergency Medicine | Admitting: Emergency Medicine

## 2016-08-02 DIAGNOSIS — F319 Bipolar disorder, unspecified: Secondary | ICD-10-CM | POA: Diagnosis not present

## 2016-08-02 DIAGNOSIS — Z5321 Procedure and treatment not carried out due to patient leaving prior to being seen by health care provider: Secondary | ICD-10-CM | POA: Diagnosis not present

## 2016-08-02 DIAGNOSIS — F419 Anxiety disorder, unspecified: Secondary | ICD-10-CM | POA: Insufficient documentation

## 2016-08-02 DIAGNOSIS — K219 Gastro-esophageal reflux disease without esophagitis: Secondary | ICD-10-CM | POA: Insufficient documentation

## 2016-08-02 DIAGNOSIS — F909 Attention-deficit hyperactivity disorder, unspecified type: Secondary | ICD-10-CM | POA: Diagnosis not present

## 2016-08-02 DIAGNOSIS — F329 Major depressive disorder, single episode, unspecified: Secondary | ICD-10-CM | POA: Insufficient documentation

## 2016-08-02 DIAGNOSIS — K2970 Gastritis, unspecified, without bleeding: Secondary | ICD-10-CM | POA: Diagnosis not present

## 2016-08-02 DIAGNOSIS — Z79899 Other long term (current) drug therapy: Secondary | ICD-10-CM | POA: Insufficient documentation

## 2016-08-02 DIAGNOSIS — J45909 Unspecified asthma, uncomplicated: Secondary | ICD-10-CM | POA: Insufficient documentation

## 2016-08-02 DIAGNOSIS — F32A Depression, unspecified: Secondary | ICD-10-CM

## 2016-08-02 NOTE — ED Notes (Signed)
Pt is refusing EKG and chest xray as well as blood work at this time.
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
## 2016-08-02 NOTE — ED Notes (Signed)
Bed: [facility]3 Expected date:  Expected time:  Means of arrival:  Comments: 

## 2016-08-02 NOTE — ED Provider Notes (Signed)
Upon arriving to the patient's room for further assessment I was informed she had eloped from the ED.    Gloriann Loan, PA-C 08/02/16 2011    Fatima Blank, MD 08/04/16 [phone]

## 2016-08-02 NOTE — ED Triage Notes (Signed)
Pt form [place] with complaints of depression. Pt denies SI and HI. Pt states she is [redacted] weeks pregnant and thinks this is related to hormones. Pt states she has had a baby before and had a similar experience with that pregnancy.  During assessment pt asked if she would have to wait in the lobby. When this RN told her she may need to wait in the lobby, the pt told this RN that she also has chest pain.  Pt states that she will refuse blood work of any type Pt states her CP is in the middle of her chest and does not radiate anywhere. Pt has strong radial pulses and adequate cap refill.

## 2016-08-02 NOTE — ED Notes (Signed)
In to speak with patient, pt denies SI/HI, denies CP. Pt states she feels she may need to see counselor outpatient, pt advised we could provide her with that info but she would need to wait in the waiting area until a room is available. Pt ask is she could leave, pt advised she was not involuntary so we were not holding her here, pt stated at that point she would like to sign out AMA, PA aware.

## 2016-08-04 ENCOUNTER — Ambulatory Visit (INDEPENDENT_AMBULATORY_CARE_PROVIDER_SITE_OTHER): Payer: Medicare Other | Admitting: Obstetrics & Gynecology

## 2016-08-04 ENCOUNTER — Encounter: Payer: Self-pay | Admitting: Obstetrics & Gynecology

## 2016-08-04 VITALS — BP 137/73 | HR 100 | Wt 204.0 lb

## 2016-08-04 DIAGNOSIS — Z3481 Encounter for supervision of other normal pregnancy, first trimester: Secondary | ICD-10-CM | POA: Diagnosis not present

## 2016-08-04 DIAGNOSIS — Z3491 Encounter for supervision of normal pregnancy, unspecified, first trimester: Secondary | ICD-10-CM

## 2016-08-04 MED ORDER — PROMETHAZINE HCL 25 MG PO TABS: 12.5000 mg | ORAL_TABLET | Freq: Four times a day (QID) | ORAL | 0 refills | Status: DC | PRN

## 2016-08-04 NOTE — Patient Instructions (Signed)
First Trimester of Pregnancy The first trimester of pregnancy is from week 1 until the end of week 12 (months 1 through 3). A week after a sperm fertilizes an egg, the egg will implant on the wall of the uterus. This embryo will begin to develop into a baby. Genes from you and your partner are forming the baby. The female genes determine whether the baby is a boy or a girl. At 6-8 weeks, the eyes and face are formed, and the heartbeat can be seen on ultrasound. At the end of 12 weeks, all the baby's organs are formed.  Now that you are pregnant, you will want to do everything you can to have a healthy baby. Two of the most important things are to get good prenatal care and to follow your health care provider's instructions. Prenatal care is all the medical care you receive before the baby's birth. This care will help prevent, find, and treat any problems during the pregnancy and childbirth. BODY CHANGES Your body goes through many changes during pregnancy. The changes vary from woman to woman.   You may gain or lose a couple of pounds at first.  You may feel sick to your stomach (nauseous) and throw up (vomit). If the vomiting is uncontrollable, call your health care provider.  You may tire easily.  You may develop headaches that can be relieved by medicines approved by your health care provider.  You may urinate more often. Painful urination may mean you have a bladder infection.  You may develop heartburn as a result of your pregnancy.  You may develop constipation because certain hormones are causing the muscles that push waste through your intestines to slow down.  You may develop hemorrhoids or swollen, bulging veins (varicose veins).  Your breasts may begin to grow larger and become tender. Your nipples may stick out more, and the tissue that surrounds them (areola) may become darker.  Your gums may bleed and may be sensitive to brushing and flossing.  Dark spots or blotches (chloasma,  mask of pregnancy) may develop on your face. This will likely fade after the baby is born.  Your menstrual periods will stop.  You may have a loss of appetite.  You may develop cravings for certain kinds of food.  You may have changes in your emotions from day to day, such as being excited to be pregnant or being concerned that something may go wrong with the pregnancy and baby.  You may have more vivid and strange dreams.  You may have changes in your hair. These can include thickening of your hair, rapid growth, and changes in texture. Some women also have hair loss during or after pregnancy, or hair that feels dry or thin. Your hair will most likely return to normal after your baby is born. WHAT TO EXPECT AT YOUR PRENATAL VISITS During a routine prenatal visit:  You will be weighed to make sure you and the baby are growing normally.  Your blood pressure will be taken.  Your abdomen will be measured to track your baby's growth.  The fetal heartbeat will be listened to starting around week 10 or 12 of your pregnancy.  Test results from any previous visits will be discussed. Your health care provider may ask you:  How you are feeling.  If you are feeling the baby move.  If you have had any abnormal symptoms, such as leaking fluid, bleeding, severe headaches, or abdominal cramping.  If you are using any tobacco products,   including cigarettes, chewing tobacco, and electronic cigarettes.  If you have any questions. Other tests that may be performed during your first trimester include:  Blood tests to find your blood type and to check for the presence of any previous infections. They will also be used to check for low iron levels (anemia) and Rh antibodies. Later in the pregnancy, blood tests for diabetes will be done along with other tests if problems develop.  Urine tests to check for infections, diabetes, or protein in the urine.  An ultrasound to confirm the proper growth  and development of the baby.  An amniocentesis to check for possible genetic problems.  Fetal screens for spina bifida and Down syndrome.  You may need other tests to make sure you and the baby are doing well.  HIV (human immunodeficiency virus) testing. Routine prenatal testing includes screening for HIV, unless you choose not to have this test. HOME CARE INSTRUCTIONS  Medicines  Follow your health care provider's instructions regarding medicine use. Specific medicines may be either safe or unsafe to take during pregnancy.  Take your prenatal vitamins as directed.  If you develop constipation, try taking a stool softener if your health care provider approves. Diet  Eat regular, well-balanced meals. Choose a variety of foods, such as meat or vegetable-based protein, fish, milk and low-fat dairy products, vegetables, fruits, and whole grain breads and cereals. Your health care provider will help you determine the amount of weight gain that is right for you.  Avoid raw meat and uncooked cheese. These carry germs that can cause birth defects in the baby.  Eating four or five small meals rather than three large meals a day may help relieve nausea and vomiting. If you start to feel nauseous, eating a few soda crackers can be helpful. Drinking liquids between meals instead of during meals also seems to help nausea and vomiting.  If you develop constipation, eat more high-fiber foods, such as fresh vegetables or fruit and whole grains. Drink enough fluids to keep your urine clear or pale yellow. Activity and Exercise  Exercise only as directed by your health care provider. Exercising will help you:  Control your weight.  Stay in shape.  Be prepared for labor and delivery.  Experiencing pain or cramping in the lower abdomen or low back is a good sign that you should stop exercising. Check with your health care provider before continuing normal exercises.  Try to avoid standing for long  periods of time. Move your legs often if you must stand in one place for a long time.  Avoid heavy lifting.  Wear low-heeled shoes, and practice good posture.  You may continue to have sex unless your health care provider directs you otherwise. Relief of Pain or Discomfort  Wear a good support bra for breast tenderness.   Take warm sitz baths to soothe any pain or discomfort caused by hemorrhoids. Use hemorrhoid cream if your health care provider approves.   Rest with your legs elevated if you have leg cramps or low back pain.  If you develop varicose veins in your legs, wear support hose. Elevate your feet for 15 minutes, 3-4 times a day. Limit salt in your diet. Prenatal Care  Schedule your prenatal visits by the twelfth week of pregnancy. They are usually scheduled monthly at first, then more often in the last 2 months before delivery.  Write down your questions. Take them to your prenatal visits.  Keep all your prenatal visits as directed by your   health care provider. Safety  Wear your seat belt at all times when driving.  Make a list of emergency phone numbers, including numbers for family, friends, the hospital, and police and fire departments. General Tips  Ask your health care provider for a referral to a local prenatal education class. Begin classes no later than at the beginning of month 6 of your pregnancy.  Ask for help if you have counseling or nutritional needs during pregnancy. Your health care provider can offer advice or refer you to specialists for help with various needs.  Do not use hot tubs, steam rooms, or saunas.  Do not douche or use tampons or scented sanitary pads.  Do not cross your legs for long periods of time.  Avoid cat litter boxes and soil used by cats. These carry germs that can cause birth defects in the baby and possibly loss of the fetus by miscarriage or stillbirth.  Avoid all smoking, herbs, alcohol, and medicines not prescribed by  your health care provider. Chemicals in these affect the formation and growth of the baby.  Do not use any tobacco products, including cigarettes, chewing tobacco, and electronic cigarettes. If you need help quitting, ask your health care provider. You may receive counseling support and other resources to help you quit.  Schedule a dentist appointment. At home, brush your teeth with a soft toothbrush and be gentle when you floss. SEEK MEDICAL CARE IF:   You have dizziness.  You have mild pelvic cramps, pelvic pressure, or nagging pain in the abdominal area.  You have persistent nausea, vomiting, or diarrhea.  You have a bad smelling vaginal discharge.  You have pain with urination.  You notice increased swelling in your face, hands, legs, or ankles. SEEK IMMEDIATE MEDICAL CARE IF:   You have a fever.  You are leaking fluid from your vagina.  You have spotting or bleeding from your vagina.  You have severe abdominal cramping or pain.  You have rapid weight gain or loss.  You vomit blood or material that looks like coffee grounds.  You are exposed to German measles and have never had them.  You are exposed to fifth disease or chickenpox.  You develop a severe headache.  You have shortness of breath.  You have any kind of trauma, such as from a fall or a car accident.   This information is not intended to replace advice given to you by your health care provider. Make sure you discuss any questions you have with your health care provider.   Document Released: 11/25/2001 Document Revised: 12/22/2014 Document Reviewed: 10/11/2013 Elsevier Interactive Patient Education 2016 [employer]  
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
## 2016-08-06 LAB — GC/CHLAMYDIA PROBE AMP
Chlamydia trachomatis, NAA: NEGATIVE
Neisseria gonorrhoeae by PCR: NEGATIVE

## 2016-08-07 LAB — CULTURE, URINE COMPREHENSIVE

## 2016-08-08 ENCOUNTER — Encounter (HOSPITAL_COMMUNITY): Payer: Self-pay

## 2016-08-08 ENCOUNTER — Emergency Department (HOSPITAL_COMMUNITY)
Admission: EM | Admit: 2016-08-08 | Discharge: 2016-08-08 | Discharge status: Against Medical Advice | Payer: Medicare Other | Attending: Emergency Medicine | Admitting: Emergency Medicine

## 2016-08-08 DIAGNOSIS — J45909 Unspecified asthma, uncomplicated: Secondary | ICD-10-CM | POA: Insufficient documentation

## 2016-08-08 DIAGNOSIS — Z79899 Other long term (current) drug therapy: Secondary | ICD-10-CM | POA: Insufficient documentation

## 2016-08-08 DIAGNOSIS — R079 Chest pain, unspecified: Secondary | ICD-10-CM

## 2016-08-08 DIAGNOSIS — R0789 Other chest pain: Secondary | ICD-10-CM | POA: Diagnosis not present

## 2016-08-08 DIAGNOSIS — Z3A10 10 weeks gestation of pregnancy: Secondary | ICD-10-CM | POA: Diagnosis not present

## 2016-08-08 DIAGNOSIS — O9989 Other specified diseases and conditions complicating pregnancy, childbirth and the puerperium: Secondary | ICD-10-CM | POA: Insufficient documentation

## 2016-08-08 DIAGNOSIS — F419 Anxiety disorder, unspecified: Secondary | ICD-10-CM | POA: Diagnosis not present

## 2016-08-08 LAB — BASIC METABOLIC PANEL
Anion gap: 8 (ref 5–15)
BUN: <5 mg/dL — ABNORMAL LOW (ref 6–20)
CO2: 21 mmol/L — ABNORMAL LOW (ref 22–32)
Calcium: 9.1 mg/dL (ref 8.9–10.3)
Chloride: 106 mmol/L (ref 101–111)
Creatinine, Ser: 0.71 mg/dL (ref 0.44–1.00)
GFR calc Af Amer: >60 mL/min (ref >60)
GFR calc non Af Amer: >60 mL/min (ref >60)
Glucose, Bld: 93 mg/dL (ref 65–99)
Potassium: 4 mmol/L (ref 3.5–5.1)
Sodium: 135 mmol/L (ref 135–145)

## 2016-08-08 LAB — RAPID URINE DRUG SCREEN, HOSP PERFORMED
Amphetamines: NOT DETECTED
Barbiturates: NOT DETECTED
Benzodiazepines: NOT DETECTED
Cocaine: NOT DETECTED
Opiates: NOT DETECTED
Tetrahydrocannabinol: NOT DETECTED

## 2016-08-08 LAB — CBC
HCT: 38 % (ref 36.0–46.0)
Hemoglobin: 12.3 g/dL (ref 12.0–15.0)
MCH: 28.6 pg (ref 26.0–34.0)
MCHC: 32.4 g/dL (ref 30.0–36.0)
MCV: 88.4 fL (ref 78.0–100.0)
Platelets: 186 10*3/uL (ref 150–400)
RBC: 4.3 MIL/uL (ref 3.87–5.11)
RDW: 15.8 % — ABNORMAL HIGH (ref 11.5–15.5)
WBC: 5.9 10*3/uL (ref 4.0–10.5)

## 2016-08-08 LAB — I-STAT TROPONIN, ED: Troponin i, poc: 0.01 ng/mL (ref 0.00–0.08)

## 2016-08-08 LAB — ETHANOL: Alcohol, Ethyl (B): <5 mg/dL (ref <5)

## 2016-08-08 LAB — SALICYLATE LEVEL: Salicylate Lvl: <4 mg/dL (ref 2.8–30.0)

## 2016-08-08 LAB — ACETAMINOPHEN LEVEL: Acetaminophen (Tylenol), Serum: <10 ug/mL — ABNORMAL LOW (ref 10–30)

## 2016-08-08 NOTE — ED Notes (Signed)
Per the PA, pt cleared by [facility] for SI, pt is to go home. Pt called out and asked this RN to sign out. Pt informed that the PA wants her to stay for delta troponin results. Pt states "i want to sign out" PA notified and pt to sign out AMA.

## 2016-08-08 NOTE — ED Triage Notes (Signed)
Pt BIB [facility] for evaluation of chest pain starting last night. Pt. States generalized sharp/stabbing pain to chest with back pain and nausea/dizziness. Pt. Is [redacted] weeks pregnant. States dizzy for past few weeks with recurrent falls. Pt. Has bruising to bilateral arms and legs. No ASA/NTG in route.

## 2016-08-08 NOTE — ED Notes (Signed)
Pt made first phone call  

## 2016-08-08 NOTE — ED Notes (Signed)
Pt refused cardiac monitoring. 

## 2016-08-08 NOTE — ED Notes (Signed)
Pt comes to triage knocking on door reporting she is now suicidal. Pt reports she has been suicidal for a couple of days but did not report this during triage.

## 2016-08-08 NOTE — ED Notes (Addendum)
Pt verbalized understanding of leaving prior to troponin results and has no further questions. Pt stable, ambulatory, d/c home with family member driving. Pt given all belongings back.

## 2016-08-08 NOTE — ED Notes (Signed)
Pt. At triage door requesting IV to be removed. This RN explained to pt. Better to keep in until seen by MD. Pt states she is not leaving at this time, but states IV hurts too bad to keep in and they will need to do another. This RN removed pt. IV and replaced with clean guaze over site. Catheter intact, clean and dry.

## 2016-08-08 NOTE — ED Notes (Signed)
Pt given saltine crackers, peanut butter and Coke Cola, per Lennette Bihari - RN.
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
## 2016-08-08 NOTE — ED Notes (Signed)
I gave pt Coca Cola, peanut butter, and saltine crackers, per Lennette Bihari, RN.

## 2016-08-08 NOTE — ED Notes (Signed)
Meal tray ordered for pt  

## 2016-08-08 NOTE — ED Notes (Signed)
Pt requested a female sitter.

## 2016-08-08 NOTE — ED Notes (Signed)
Pt finished TTS consult.

## 2016-08-08 NOTE — ED Notes (Signed)
Pt called to redo vitals 3x. No answer.

## 2016-08-08 NOTE — ED Notes (Signed)
Pt made second phone call °
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
## 2016-08-08 NOTE — ED Notes (Signed)
Pt refused second I-stat troponin. Lennette Bihari - RN informed.

## 2016-08-08 NOTE — ED Notes (Signed)
Tech sitting with pt.

## 2016-08-11 LAB — I-STAT TROPONIN, ED: Troponin i, poc: 0 ng/mL (ref 0.00–0.08)

## 2016-08-12 ENCOUNTER — Telehealth: Payer: Self-pay

## 2016-08-12 DIAGNOSIS — N390 Urinary tract infection, site not specified: Secondary | ICD-10-CM | POA: Diagnosis not present

## 2016-08-12 DIAGNOSIS — R58 Hemorrhage, not elsewhere classified: Secondary | ICD-10-CM | POA: Diagnosis not present

## 2016-08-12 DIAGNOSIS — R102 Pelvic and perineal pain: Secondary | ICD-10-CM | POA: Diagnosis not present

## 2016-08-12 LAB — PRENATAL PROFILE I(LABCORP)
Antibody Screen: NEGATIVE
Basophils Absolute: 0 10*3/uL (ref 0.0–0.2)
Basos: 0 %
EOS (ABSOLUTE): 0 10*3/uL (ref 0.0–0.4)
Eos: 0 %
Hematocrit: 39 % (ref 34.0–46.6)
Hemoglobin: 12.4 g/dL (ref 11.1–15.9)
Hepatitis B Surface Ag: NEGATIVE
Immature Grans (Abs): 0 10*3/uL (ref 0.0–0.1)
Immature Granulocytes: 0 %
Lymphocytes Absolute: 1.4 10*3/uL (ref 0.7–3.1)
Lymphs: 23 %
MCH: 27.7 pg (ref 26.6–33.0)
MCHC: 31.8 g/dL (ref 31.5–35.7)
MCV: 87 fL (ref 79–97)
Monocytes Absolute: 0.3 10*3/uL (ref 0.1–0.9)
Monocytes: 5 %
Neutrophils Absolute: 4.4 10*3/uL (ref 1.4–7.0)
Neutrophils: 72 %
Platelets: 205 10*3/uL (ref 150–379)
RBC: 4.47 x10E6/uL (ref 3.77–5.28)
RDW: 16.5 % — ABNORMAL HIGH (ref 12.3–15.4)
RPR Ser Ql: NONREACTIVE
Rh Factor: POSITIVE
Rubella Antibodies, IGG: 3.62 index (ref >0.99)
WBC: 6.1 10*3/uL (ref 3.4–10.8)

## 2016-08-12 LAB — MATERNIT 21 PLUS CORE, BLOOD
Chromosome 13: NEGATIVE
Chromosome 18: NEGATIVE
Chromosome 21: NEGATIVE
PDF: 0
Y Chromosome: DETECTED

## 2016-08-12 LAB — OPIATE, QUANTITATIVE, URINE
OXYCODONE+OXYMORPHONE UR QL SCN: NEGATIVE
Opiates: NEGATIVE

## 2016-08-12 LAB — HIV ANTIBODY (ROUTINE TESTING W REFLEX): HIV Screen 4th Generation wRfx: NONREACTIVE

## 2016-08-12 NOTE — Telephone Encounter (Signed)
-----   Message from Woodroe Mode, MD sent at 08/12/2016 11:23 AM EDT ----- Normal female chromosomes

## 2016-09-02 ENCOUNTER — Encounter: Payer: Medicare Other | Admitting: Obstetrics and Gynecology

## 2016-09-10 ENCOUNTER — Inpatient Hospital Stay (HOSPITAL_COMMUNITY): Payer: Medicare Other

## 2016-09-10 ENCOUNTER — Encounter (HOSPITAL_COMMUNITY): Payer: Self-pay

## 2016-09-10 ENCOUNTER — Inpatient Hospital Stay (HOSPITAL_COMMUNITY)
Admission: AD | Admit: 2016-09-10 | Discharge: 2016-09-10 | Disposition: A | Discharge status: Home / Self Care | Payer: Medicare Other | Source: Ambulatory Visit | Attending: Obstetrics and Gynecology | Admitting: Obstetrics and Gynecology

## 2016-09-10 DIAGNOSIS — O209 Hemorrhage in early pregnancy, unspecified: Secondary | ICD-10-CM | POA: Insufficient documentation

## 2016-09-10 DIAGNOSIS — O4692 Antepartum hemorrhage, unspecified, second trimester: Secondary | ICD-10-CM

## 2016-09-10 DIAGNOSIS — O468X2 Other antepartum hemorrhage, second trimester: Secondary | ICD-10-CM

## 2016-09-10 DIAGNOSIS — O418X20 Other specified disorders of amniotic fluid and membranes, second trimester, not applicable or unspecified: Secondary | ICD-10-CM

## 2016-09-10 DIAGNOSIS — Z3A15 15 weeks gestation of pregnancy: Secondary | ICD-10-CM

## 2016-09-10 DIAGNOSIS — R1084 Generalized abdominal pain: Secondary | ICD-10-CM | POA: Diagnosis not present

## 2016-09-10 LAB — WET PREP, GENITAL
Clue Cells Wet Prep HPF POC: NONE SEEN
Sperm: NONE SEEN
Trich, Wet Prep: NONE SEEN
Yeast Wet Prep HPF POC: NONE SEEN

## 2016-09-10 NOTE — MAU Note (Signed)
Pt presents complaining of bright red bleeding that started 30 minutes ago. Also having lower abdominal pain that started today. Has not tried any medication for the pain. Last intercourse this am.

## 2016-09-10 NOTE — Discharge Instructions (Signed)
Subchorionic Hematoma A subchorionic hematoma is a gathering of blood between the outer wall of the placenta and the inner wall of the womb (uterus). The placenta is the organ that connects the fetus to the wall of the uterus. The placenta performs the feeding, breathing (oxygen to the fetus), and waste removal (excretory work) of the fetus.  Subchorionic hematoma is the most common abnormality found on a result from ultrasonography done during the first trimester or early second trimester of pregnancy. If there has been little or no vaginal bleeding, early small hematomas usually shrink on their own and do not affect your baby or pregnancy. The blood is gradually absorbed over 1-2 weeks. When bleeding starts later in pregnancy or the hematoma is larger or occurs in an older pregnant woman, the outcome may not be as good. Larger hematomas may get bigger, which increases the chances for miscarriage. Subchorionic hematoma also increases the risk of premature detachment of the placenta from the uterus, preterm (premature) labor, and stillbirth. HOME CARE INSTRUCTIONS  Stay on bed rest if your health care provider recommends this. Although bed rest will not prevent more bleeding or prevent a miscarriage, your health care provider may recommend bed rest until you are advised otherwise.  Avoid heavy lifting (more than 10 lb [4.5 kg]), exercise, sexual intercourse, or douching as directed by your health care provider.  Keep track of the number of pads you use each day and how soaked (saturated) they are. Write down this information.  Do not use tampons.  Keep all follow-up appointments as directed by your health care provider. Your health care provider may ask you to have follow-up blood tests or ultrasound tests or both. SEEK IMMEDIATE MEDICAL CARE IF:  You have severe cramps in your stomach, back, abdomen, or pelvis.  You have a fever.  You pass large clots or tissue. Save any tissue for your health  care provider to look at.  Your bleeding increases or you become lightheaded, feel weak, or have fainting episodes.   This information is not intended to replace advice given to you by your health care provider. Make sure you discuss any questions you have with your health care provider.   Document Released: 03/18/2007 Document Revised: 12/22/2014 Document Reviewed: 06/30/2013 Elsevier Interactive Patient Education 2016 [place].  Pelvic Rest Pelvic rest is sometimes recommended for women when:   The placenta is partially or completely covering the opening of the cervix (placenta previa).  There is bleeding between the uterine wall and the amniotic sac in the first trimester (subchorionic hemorrhage).  The cervix begins to open without labor starting (incompetent cervix, cervical insufficiency).  The labor is too early (preterm labor). HOME CARE INSTRUCTIONS  Do not have sexual intercourse, stimulation, or an orgasm.  Do not use tampons, douche, or put anything in the vagina.  Do not lift anything over 10 pounds (4.5 kg).  Avoid strenuous activity or straining your pelvic muscles. SEEK MEDICAL CARE IF:  You have any vaginal bleeding during pregnancy. Treat this as a potential emergency.  You have cramping pain felt low in the stomach (stronger than menstrual cramps).  You notice vaginal discharge (watery, mucus, or bloody).  You have a low, dull backache.  There are regular contractions or uterine tightening. SEEK IMMEDIATE MEDICAL CARE IF: You have vaginal bleeding and have placenta previa.    This information is not intended to replace advice given to you by your health care provider. Make sure you discuss any questions you have  with your health care provider.   Document Released: 03/28/2011 Document Revised: 02/23/2012 Document Reviewed: 06/04/2015 Elsevier Interactive Patient Education [employer].

## 2016-09-10 NOTE — MAU Provider Note (Signed)
History     CSN: [id number]  Arrival date and time: 09/10/16 2012   First Provider Initiated Contact with Patient 09/10/16 2044      Chief Complaint  Patient presents with  . Vaginal Bleeding   HPI  [name] is a 30 y.o. G2P1001 at [redacted]w[redacted]d by first trimester ultrasound who presents via EMS with vaginal bleeding. Symptoms began 30 minutes PTA. Describes as bright red bleeding on toilet paper. No clots. Reports lower abdominal cramping since yesterday. Rates pain 5/10. Pain comes & goes. Has not treated. Nothing makes better or worse. Had intercourse last night.   OB History    Gravida Para Term Preterm AB Living   2 1 1     1    SAB TAB Ectopic Multiple Live Births         0 1      Past Medical History:  Diagnosis Date  . Anxiety   . Asthma    inhaler PRN-hasn't used inhaler in months  . Attention deficit hyperactivity disorder   . Bipolar disorder [facility])    hospitalized as a teen for suicidal ideations and cutting  . Depression   . Gastritis   . GERD (gastroesophageal reflux disease)   . HPV (human papilloma virus) anogenital infection   . Hx of varicella   . Mild preeclampsia 02/09/2016  . SVD (spontaneous vaginal delivery) 02/11/2016  . UTI (urinary tract infection)    Completed Cipro 08/06/12  . Vaginal Pap smear, abnormal     Past Surgical History:  Procedure Laterality Date  . EYE MUSCLE SURGERY Bilateral    07/29/12  . MOUTH SURGERY    . PLANTAR FASCIA SURGERY      Family History  Problem Relation Age of Onset  . Depression Mother   . Colon cancer Mother   . Colon polyps Mother   . Depression Father   . Depression Brother   . Liver disease    . Kidney disease      Social History  Substance Use Topics  . Smoking status: Never Smoker  . Smokeless tobacco: Never Used  . Alcohol use No    Allergies:  Allergies  Allergen Reactions  . Depakote [Divalproex Sodium] Other (See Comments)    Pt states that in the past her blood levels were toxic   . Methylphenidate Derivatives Other (See Comments)    Reaction:  Depression and anger   . Neurontin [Gabapentin] Other (See Comments)    Reaction:  Dizziness   . Prozac [Fluoxetine Hcl] Other (See Comments)    Reaction:  Anger     Prescriptions Prior to Admission  Medication Sig Dispense Refill Last Dose  . lurasidone (LATUDA) 20 MG TABS tablet Take 20 mg by mouth at bedtime.   08/06/2016  . Prenatal Vit-Fe Fumarate-FA (PRENATAL MULTIVITAMIN) TABS tablet Take 1 tablet by mouth at bedtime.   08/07/2016  . Prenatal Vit-Fe Fumarate-FA (PREPLUS) 27-1 MG TABS Take 1 tablet by mouth daily. (Patient not taking: Reported on 08/08/2016) 30 tablet 13 Not Taking at Unknown time  . promethazine (PHENERGAN) 25 MG tablet Take 0.5-1 tablets (12.5-25 mg total) by mouth every 6 (six) hours as needed for nausea. (Patient not taking: Reported on 08/08/2016) 30 tablet 0 Not Taking at Unknown time    Review of Systems  Constitutional: Negative.   Gastrointestinal: Positive for abdominal pain. Negative for constipation, diarrhea, nausea and vomiting.  Genitourinary: Negative for dysuria.       + vaginal bleeding   Physical  Exam   Blood pressure 116/69, pulse 81, temperature 98.6 F (37 C), temperature source Oral, resp. rate 17, last menstrual period 05/12/2016, unknown if currently breastfeeding.  Physical Exam  Nursing note and vitals reviewed. Constitutional: She is oriented to person, place, and time. She appears well-developed and well-nourished. No distress.  HENT:  Head: Normocephalic and atraumatic.  Eyes: Conjunctivae are normal. Right eye exhibits no discharge. Left eye exhibits no discharge. No scleral icterus.  Neck: Normal range of motion.  Respiratory: Effort normal. No respiratory distress.  Genitourinary:  Genitourinary Comments: Small amount of dark red blood. No active bleeding. Cervix closed  Neurological: She is alert and oriented to person, place, and time.  Skin: Skin is warm and  dry. She is not diaphoretic.  Psychiatric: She has a normal mood and affect. Her behavior is normal. Judgment and thought content normal.    MAU Course  Procedures Results for orders placed or performed during the hospital encounter of 09/10/16 (from the past 24 hour(s))  Wet prep, genital     Status: Abnormal   Collection Time: 09/10/16  9:00 PM  Result Value Ref Range   Yeast Wet Prep HPF POC NONE SEEN NONE SEEN   Trich, Wet Prep NONE SEEN NONE SEEN   Clue Cells Wet Prep HPF POC NONE SEEN NONE SEEN   WBC, Wet Prep HPF POC FEW (A) NONE SEEN   Sperm NONE SEEN     MDM FHT 153 by doppler O positive Cervix closed Pt refused to give urine sample Ultrasound shows small [facility] Assessment and Plan  A: 1. Subchorionic hematoma in second trimester   2. [redacted] weeks gestation of pregnancy   3. Vaginal bleeding in pregnancy, second trimester     P: Discharge home Pelvic rest Keep f/u with OB Discussed reasons to return to [place] 09/10/2016, 8:43 PM

## 2016-09-11 LAB — GC/CHLAMYDIA PROBE AMP (~~LOC~~) NOT AT ARMC
Chlamydia: NEGATIVE
Neisseria Gonorrhea: NEGATIVE

## 2016-09-17 ENCOUNTER — Encounter: Payer: Medicare Other | Admitting: Obstetrics & Gynecology

## 2016-09-18 ENCOUNTER — Encounter: Payer: Self-pay | Admitting: Certified Nurse Midwife

## 2016-09-18 ENCOUNTER — Ambulatory Visit (INDEPENDENT_AMBULATORY_CARE_PROVIDER_SITE_OTHER): Payer: Medicare Other | Admitting: Certified Nurse Midwife

## 2016-09-18 VITALS — Wt 201.0 lb

## 2016-09-18 DIAGNOSIS — F900 Attention-deficit hyperactivity disorder, predominantly inattentive type: Secondary | ICD-10-CM | POA: Insufficient documentation

## 2016-09-18 DIAGNOSIS — Z8659 Personal history of other mental and behavioral disorders: Secondary | ICD-10-CM

## 2016-09-18 DIAGNOSIS — IMO0002 Reserved for concepts with insufficient information to code with codable children: Secondary | ICD-10-CM | POA: Insufficient documentation

## 2016-09-18 DIAGNOSIS — H5213 Myopia, bilateral: Secondary | ICD-10-CM | POA: Insufficient documentation

## 2016-09-18 DIAGNOSIS — Z3482 Encounter for supervision of other normal pregnancy, second trimester: Secondary | ICD-10-CM

## 2016-09-18 DIAGNOSIS — R238 Other skin changes: Secondary | ICD-10-CM
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
## 2016-09-22 ENCOUNTER — Institutional Professional Consult (permissible substitution): Payer: Self-pay

## 2016-09-23 ENCOUNTER — Inpatient Hospital Stay (HOSPITAL_COMMUNITY)
Admission: AD | Admit: 2016-09-23 | Discharge: 2016-09-23 | Disposition: A | Discharge status: Home / Self Care | Payer: Medicare Other | Source: Ambulatory Visit | Attending: Obstetrics and Gynecology | Admitting: Obstetrics and Gynecology

## 2016-09-23 ENCOUNTER — Encounter (HOSPITAL_COMMUNITY): Payer: Self-pay | Admitting: *Deleted

## 2016-09-23 DIAGNOSIS — R1084 Generalized abdominal pain: Secondary | ICD-10-CM | POA: Diagnosis not present

## 2016-09-23 DIAGNOSIS — Z3A17 17 weeks gestation of pregnancy: Secondary | ICD-10-CM | POA: Insufficient documentation

## 2016-09-23 DIAGNOSIS — R109 Unspecified abdominal pain: Secondary | ICD-10-CM | POA: Insufficient documentation

## 2016-09-23 DIAGNOSIS — Z5321 Procedure and treatment not carried out due to patient leaving prior to being seen by health care provider: Secondary | ICD-10-CM | POA: Insufficient documentation

## 2016-09-23 DIAGNOSIS — O26892 Other specified pregnancy related conditions, second trimester: Secondary | ICD-10-CM | POA: Diagnosis not present

## 2016-09-23 DIAGNOSIS — O26899 Other specified pregnancy related conditions, unspecified trimester: Secondary | ICD-10-CM

## 2016-09-23 LAB — URINALYSIS, ROUTINE W REFLEX MICROSCOPIC
Bilirubin Urine: NEGATIVE
Glucose, UA: NEGATIVE mg/dL
Hgb urine dipstick: NEGATIVE
Ketones, ur: NEGATIVE mg/dL
Leukocytes, UA: NEGATIVE
Nitrite: NEGATIVE
Protein, ur: NEGATIVE mg/dL
Specific Gravity, Urine: >1.03 — ABNORMAL HIGH (ref 1.005–1.030)
pH: 6 (ref 5.0–8.0)

## 2016-09-23 NOTE — MAU Provider Note (Signed)
  History     CSN: [id number]  Arrival date and time: 09/23/16 1221   None     Chief Complaint  Patient presents with  . Abdominal Cramping   HPI  OB History    Gravida Para Term Preterm AB Living   4 1 1   2 1    SAB TAB Ectopic Multiple Live Births         0 1      Past Medical History:  Diagnosis Date  . Anxiety   . Asthma    inhaler PRN-hasn't used inhaler in months  . Attention deficit hyperactivity disorder   . Bipolar disorder [facility])    hospitalized as a teen for suicidal ideations and cutting  . Depression   . Gastritis   . GERD (gastroesophageal reflux disease)   . HPV (human papilloma virus) anogenital infection   . Hx of varicella   . Mild preeclampsia 02/09/2016  . Vaginal Pap smear, abnormal     Past Surgical History:  Procedure Laterality Date  . EYE MUSCLE SURGERY Bilateral    07/29/12  . MOUTH SURGERY    . PLANTAR FASCIA SURGERY      Family History  Problem Relation Age of Onset  . Depression Mother   . Colon cancer Mother   . Colon polyps Mother   . Depression Father   . Depression Brother   . Liver disease    . Kidney disease      Social History  Substance Use Topics  . Smoking status: Never Smoker  . Smokeless tobacco: Never Used  . Alcohol use No    Allergies:  Allergies  Allergen Reactions  . Depakote [Divalproex Sodium] Other (See Comments)    Pt states that this medication makes her blood levels toxic.    [name] Methylphenidate Derivatives Other (See Comments)    Reaction:  Depression and anger   . Neurontin [Gabapentin] Other (See Comments)    Reaction:  Dizziness   . Prozac [Fluoxetine Hcl] Other (See Comments)    Reaction:  Anger     Prescriptions Prior to Admission  Medication Sig Dispense Refill Last Dose  . ARIPiprazole (ABILIFY) 15 MG tablet Take 15 mg by mouth daily.   09/23/2016 at Unknown time  . Prenatal Vit-Fe Fumarate-FA (PRENATAL MULTIVITAMIN) TABS tablet Take 1 tablet by mouth daily.    09/23/2016 at  Unknown time  . promethazine (PHENERGAN) 25 MG tablet Take 0.5-1 tablets (12.5-25 mg total) by mouth every 6 (six) hours as needed for nausea. (Patient not taking: Reported on 08/08/2016) 30 tablet 0 Not Taking at Unknown time    Review of Systems  HENT: Positive for sore throat.   Gastrointestinal: Positive for abdominal pain.   Physical Exam   Blood pressure 132/69, pulse 87, temperature 99.1 F (37.3 C), resp. rate 18, last menstrual period 05/12/2016, unknown if currently breastfeeding.  Physical Exam  MAU Course  Procedures No results found for this or any previous visit (from the past 24 hour(s)).  MDM FHT 155 by doppler Strep swab ordered for sore throat but patient refused.  Entered pt's room and she said she needed to leave -- signed out Concepcion   Assessment and Plan  Pt left AMA prior to full evaluation  Jorje Guild 09/23/2016, 1:39 PM

## 2016-09-23 NOTE — MAU Note (Signed)
Urine in lab 

## 2016-09-23 NOTE — MAU Note (Signed)
Pt was seen here two weeks ago for spotting and lower abdominal cramping.  She arrived EMS today for the same problem.  She said she is using panty liners for her spotting that is bright red.  She is having lower abdominal cramping that is intermittent.  Denies LOF.

## 2016-09-23 NOTE — MAU Note (Signed)
Pt refused to have her throat swabbed. Stated " I don't want to throw up". Jorje Guild NP aware

## 2016-09-23 NOTE — MAU Note (Addendum)
States had some bleeding yesterday but none now. Came in today because of lower abd cramping. Sore throat.

## 2016-09-26 ENCOUNTER — Encounter (HOSPITAL_COMMUNITY): Payer: Self-pay | Admitting: *Deleted

## 2016-09-26 ENCOUNTER — Inpatient Hospital Stay (HOSPITAL_COMMUNITY)
Admission: AD | Admit: 2016-09-26 | Discharge: 2016-09-26 | Disposition: A | Discharge status: Home / Self Care | Payer: Medicare Other | Source: Ambulatory Visit | Attending: Obstetrics & Gynecology | Admitting: Obstetrics & Gynecology

## 2016-09-26 DIAGNOSIS — J028 Acute pharyngitis due to other specified organisms: Secondary | ICD-10-CM

## 2016-09-26 DIAGNOSIS — J029 Acute pharyngitis, unspecified: Secondary | ICD-10-CM | POA: Diagnosis present

## 2016-09-26 DIAGNOSIS — E860 Dehydration: Secondary | ICD-10-CM

## 2016-09-26 DIAGNOSIS — Z3A17 17 weeks gestation of pregnancy: Secondary | ICD-10-CM

## 2016-09-26 DIAGNOSIS — J069 Acute upper respiratory infection, unspecified: Secondary | ICD-10-CM | POA: Insufficient documentation

## 2016-09-26 DIAGNOSIS — O9989 Other specified diseases and conditions complicating pregnancy, childbirth and the puerperium: Secondary | ICD-10-CM

## 2016-09-26 DIAGNOSIS — R112 Nausea with vomiting, unspecified: Secondary | ICD-10-CM | POA: Diagnosis not present

## 2016-09-26 DIAGNOSIS — B9789 Other viral agents as the cause of diseases classified elsewhere: Secondary | ICD-10-CM

## 2016-09-26 DIAGNOSIS — O99512 Diseases of the respiratory system complicating pregnancy, second trimester: Secondary | ICD-10-CM | POA: Insufficient documentation

## 2016-09-26 LAB — URINALYSIS, ROUTINE W REFLEX MICROSCOPIC
Glucose, UA: NEGATIVE mg/dL
Hgb urine dipstick: NEGATIVE
Ketones, ur: 40 mg/dL — AB
Leukocytes, UA: NEGATIVE
Nitrite: NEGATIVE
Protein, ur: NEGATIVE mg/dL
Specific Gravity, Urine: 1.025 (ref 1.005–1.030)
pH: 6 (ref 5.0–8.0)

## 2016-09-26 MED ORDER — FLUTICASONE PROPIONATE 50 MCG/ACT NA SUSP: 1.0000 | Freq: Two times a day (BID) | NASAL | 2 refills | Status: DC

## 2016-09-26 NOTE — MAU Note (Signed)
Pt came in by EMS - C/O sore throat for the past week, also vomiting for the past week, denies diarrhea.  No abd pain or vaginal bleeding.

## 2016-09-26 NOTE — Discharge Instructions (Signed)
Safe Medications in Pregnancy   Acne:  Benzoyl Peroxide  Salicylic Acid   Backache/Headache:  Tylenol: 2 regular strength every 4 hours OR        2 Extra strength every 6 hours   Colds/Coughs/Allergies:  Benadryl (alcohol free) 25 mg every 6 hours as needed  Breath right strips  Claritin  Cepacol throat lozenges  Chloraseptic throat spray  Cold-Eeze- up to three times per day  Cough drops, alcohol free  Flonase (by prescription only)  Guaifenesin  Mucinex  Robitussin DM (plain only, alcohol free)  Saline nasal spray/drops  Sudafed (pseudoephedrine) & Actifed * use only after [redacted] weeks gestation and if you do not have high blood pressure  Tylenol  Vicks Vaporub  Zinc lozenges  Zyrtec   Constipation:  Colace  Ducolax suppositories  Fleet enema  Glycerin suppositories  Metamucil  Milk of magnesia  Miralax  Senokot  Smooth move tea   Diarrhea:  Kaopectate  Imodium A-D   *NO pepto Bismol   Hemorrhoids:  Anusol  Anusol HC  Preparation H  Tucks   Indigestion:  Tums  Maalox  Mylanta  Zantac  Pepcid   Insomnia:  Benadryl (alcohol free) 25mg  every 6 hours as needed  Tylenol PM  Unisom, no Gelcaps   Leg Cramps:  Tums  MagGel   Nausea/Vomiting:  Bonine  Dramamine  Emetrol  Ginger extract  Sea bands  Meclizine  Nausea medication to take during pregnancy:  Unisom (doxylamine succinate 25 mg tablets) Take one tablet daily at bedtime. If symptoms are not adequately controlled, the dose can be increased to a maximum recommended dose of two tablets daily (1/2 tablet in the morning, 1/2 tablet mid-afternoon and one at bedtime).  Vitamin B6 100mg  tablets. Take one tablet twice a day (up to 200 mg per day).   Skin Rashes:  Aveeno products  Benadryl cream or 25mg  every 6 hours as needed  Calamine Lotion  1% cortisone cream   Yeast infection:  Gyne-lotrimin 7  Monistat 7    **If taking multiple medications, please check labels to avoid  duplicating the same active ingredients  **take medication as directed on the label  ** Do not exceed 4000 mg of tylenol in 24 hours  **Do not take medications that contain aspirin or ibuprofen           Pharyngitis Pharyngitis is redness, pain, and swelling (inflammation) of your pharynx.  CAUSES  Pharyngitis is usually caused by infection. Most of the time, these infections are from viruses (viral) and are part of a cold. However, sometimes pharyngitis is caused by bacteria (bacterial). Pharyngitis can also be caused by allergies. Viral pharyngitis may be spread from person to person by coughing, sneezing, and personal items or utensils (cups, forks, spoons, toothbrushes). Bacterial pharyngitis may be spread from person to person by more intimate contact, such as kissing.  SIGNS AND SYMPTOMS  Symptoms of pharyngitis include:   Sore throat.   Tiredness (fatigue).   Low-grade fever.   Headache.  Joint pain and muscle aches.  Skin rashes.  Swollen lymph nodes.  Plaque-like film on throat or tonsils (often seen with bacterial pharyngitis). DIAGNOSIS  Your health care provider will ask you questions about your illness and your symptoms. Your medical history, along with a physical exam, is often all that is needed to diagnose pharyngitis. Sometimes, a rapid strep test is done. Other lab tests may also be done, depending on the suspected cause.  TREATMENT  Viral pharyngitis will usually get  better in 3-4 days without the use of medicine. Bacterial pharyngitis is treated with medicines that kill germs (antibiotics).  HOME CARE INSTRUCTIONS   Drink enough water and fluids to keep your urine clear or pale yellow.   Only take over-the-counter or prescription medicines as directed by your health care provider:   If you are prescribed antibiotics, make sure you finish them even if you start to feel better.   Do not take aspirin.   Get lots of rest.   Gargle with 8 oz  of salt water ( tsp of salt per 1 qt of water) as often as every 1-2 hours to soothe your throat.   Throat lozenges (if you are not at risk for choking) or sprays may be used to soothe your throat. SEEK MEDICAL CARE IF:   You have large, tender lumps in your neck.  You have a rash.  You cough up green, yellow-brown, or bloody spit. SEEK IMMEDIATE MEDICAL CARE IF:   Your neck becomes stiff.  You drool or are unable to swallow liquids.  You vomit or are unable to keep medicines or liquids down.  You have severe pain that does not go away with the use of recommended medicines.  You have trouble breathing (not caused by a stuffy nose). MAKE SURE YOU:   Understand these instructions.  Will watch your condition.  Will get help right away if you are not doing well or get worse.   This information is not intended to replace advice given to you by your health care provider. Make sure you discuss any questions you have with your health care provider.   Document Released: 12/01/2005 Document Revised: 09/21/2013 Document Reviewed: 08/08/2013 Elsevier Interactive Patient Education [employer].

## 2016-09-26 NOTE — MAU Provider Note (Signed)
History     CSN: [id number]  Arrival date and time: 09/26/16 [id number]   First Provider Initiated Contact with Patient 09/26/16 [phone]      Chief Complaint  Patient presents with  . Sore Throat  . Emesis During Pregnancy   HPI  Patient is a 30 year old G4 P1 at 17 weeks and 2 days who presents for continued evaluation of sore throat cough and congestion. She reports she had these symptoms and she was seen here on October 10. At that time she refused a third Sarver strep throat and left AGAINST MEDICAL ADVICE. Patient says she continues to have sore throat and had 1 episode of emesis with the coughing spelled this morning. She otherwise is not nauseous and is eating a bacon Egg and she is muffin in the room during evaluation. She is concerned she may be slightly dehydrated. She denies fevers or chills. She reports significant nasal congestion sore throat that's worsened the morning and somewhat gravelly voice. She reports mild cough but no shortness of breath. She reports no sinus pressure.  OB History    Gravida Para Term Preterm AB Living   4 1 1   2 1    SAB TAB Ectopic Multiple Live Births         0 1      Past Medical History:  Diagnosis Date  . Anxiety   . Asthma    inhaler PRN-hasn't used inhaler in months  . Attention deficit hyperactivity disorder   . Bipolar disorder [facility])    hospitalized as a teen for suicidal ideations and cutting  . Depression   . Gastritis   . GERD (gastroesophageal reflux disease)   . HPV (human papilloma virus) anogenital infection   . Hx of varicella   . Mild preeclampsia 02/09/2016  . Vaginal Pap smear, abnormal     Past Surgical History:  Procedure Laterality Date  . EYE MUSCLE SURGERY Bilateral    07/29/12  . MOUTH SURGERY    . PLANTAR FASCIA SURGERY      Family History  Problem Relation Age of Onset  . Depression Mother   . Colon cancer Mother   . Colon polyps Mother   . Depression Father   . Depression Brother   . Liver disease     . Kidney disease      Social History  Substance Use Topics  . Smoking status: Never Smoker  . Smokeless tobacco: Never Used  . Alcohol use No    Allergies:  Allergies  Allergen Reactions  . Depakote [Divalproex Sodium] Other (See Comments)    Pt states that this medication makes her blood levels toxic.    [name] Methylphenidate Derivatives Other (See Comments)    Reaction:  Depression and anger   . Neurontin [Gabapentin] Other (See Comments)    Reaction:  Dizziness   . Prozac [Fluoxetine Hcl] Other (See Comments)    Reaction:  Anger     Prescriptions Prior to Admission  Medication Sig Dispense Refill Last Dose  . ARIPiprazole (ABILIFY) 15 MG tablet Take 15 mg by mouth daily.   09/23/2016 at Unknown time  . Prenatal Vit-Fe Fumarate-FA (PRENATAL MULTIVITAMIN) TABS tablet Take 1 tablet by mouth daily.    09/23/2016 at Unknown time  . promethazine (PHENERGAN) 25 MG tablet Take 0.5-1 tablets (12.5-25 mg total) by mouth every 6 (six) hours as needed for nausea. (Patient not taking: Reported on 08/08/2016) 30 tablet 0 Not Taking at Unknown time    Review of Systems  Constitutional: Negative for chills and fever.  HENT: Positive for congestion and sore throat. Negative for ear discharge, ear pain, nosebleeds and tinnitus.   Eyes: Negative for blurred vision and double vision.  Respiratory: Positive for cough and sputum production. Negative for hemoptysis, shortness of breath and wheezing.   Cardiovascular: Negative for chest pain and palpitations.  Gastrointestinal: Positive for vomiting. Negative for abdominal pain, heartburn and nausea.  Genitourinary: Negative for dysuria, frequency and urgency.  Musculoskeletal: Negative for back pain, myalgias and neck pain.  Skin: Negative for itching and rash.  Neurological: Negative for dizziness, tingling, tremors and headaches.  Endo/Heme/Allergies: Does not bruise/bleed easily.   Physical Exam   Pulse 99, temperature 98.4 F (36.9 C),  temperature source Oral, resp. rate 18, last menstrual period 05/12/2016, SpO2 100 %, unknown if currently breastfeeding.  Physical Exam  Constitutional: She is oriented to person, place, and time. She appears well-developed and well-nourished.  HENT:  Head: Normocephalic and atraumatic.  Significant cobblestoning in the posterior pharynx, no tonsillar enlargement, no exudates noted on tonsils. Bilateral ear canals occluded with impacted cerumen  Neck: Normal range of motion. Neck supple.  Cardiovascular: Normal rate, regular rhythm and normal heart sounds.  Exam reveals no gallop and no friction rub.   No murmur heard. Respiratory: Effort normal and breath sounds normal. No respiratory distress. She has no wheezes. She has no rales.  GI: Soft. Bowel sounds are normal. She exhibits no distension. There is no tenderness. There is no rebound and no guarding.  Musculoskeletal: Normal range of motion.  Lymphadenopathy:    She has cervical adenopathy.  Neurological: She is alert and oriented to person, place, and time.  Skin: Skin is warm and dry.    MAU Course  Procedures  MDM In the MAU patient was noted to be afebrile with stable vital signs. She did undergo urinalysis which revealed 40 ketones and a specific gravity of 1.030. There is some concern for mild dehydration. I did provide the patient with by mouth fluids and encouraged her to drink while here. She had minimal nausea given that she was eating a bacon Eggen she is muffin while being evaluated. Patient's centor score is only 1. Will not swab or treat for strep at this time.   Assessment and Plan  Viral URI: patient prescribed nasal steroids which may help with nasal congestion otherwise given a list of safe over-the-counter medications in pregnancy. Mild dehydration: Patient tolerated oral replenishment well.  [name] 09/26/2016, 8:49 AM

## 2016-10-03 ENCOUNTER — Other Ambulatory Visit: Payer: Self-pay | Admitting: Certified Nurse Midwife

## 2016-10-05 ENCOUNTER — Emergency Department (HOSPITAL_COMMUNITY)
Admission: EM | Admit: 2016-10-05 | Discharge: 2016-10-05 | Disposition: A | Discharge status: Home / Self Care | Payer: Medicare Other | Attending: Dermatology | Admitting: Dermatology

## 2016-10-05 DIAGNOSIS — J029 Acute pharyngitis, unspecified: Secondary | ICD-10-CM | POA: Diagnosis not present

## 2016-10-05 DIAGNOSIS — Z79899 Other long term (current) drug therapy: Secondary | ICD-10-CM | POA: Diagnosis not present

## 2016-10-05 DIAGNOSIS — O99512 Diseases of the respiratory system complicating pregnancy, second trimester: Secondary | ICD-10-CM | POA: Insufficient documentation

## 2016-10-05 DIAGNOSIS — F909 Attention-deficit hyperactivity disorder, unspecified type: Secondary | ICD-10-CM | POA: Insufficient documentation

## 2016-10-05 DIAGNOSIS — J45909 Unspecified asthma, uncomplicated: Secondary | ICD-10-CM | POA: Insufficient documentation

## 2016-10-05 DIAGNOSIS — Z5321 Procedure and treatment not carried out due to patient leaving prior to being seen by health care provider: Secondary | ICD-10-CM | POA: Insufficient documentation

## 2016-10-05 NOTE — ED Triage Notes (Signed)
Pt decided not to be seen while being triaged.

## 2016-10-05 NOTE — ED Triage Notes (Signed)
Pt states that [facility]because she is [redacted] wks pregnant) for a sore throat and she also has a rash on her buttocks. Alert and oriented.

## 2016-10-06 ENCOUNTER — Other Ambulatory Visit: Payer: Self-pay | Admitting: Certified Nurse Midwife

## 2016-10-06 ENCOUNTER — Ambulatory Visit (HOSPITAL_COMMUNITY): Payer: Medicare Other

## 2016-10-06 ENCOUNTER — Ambulatory Visit (HOSPITAL_COMMUNITY)
Admission: RE | Admit: 2016-10-06 | Discharge: 2016-10-06 | Disposition: A | Discharge status: Home / Self Care | Payer: Medicare Other | Source: Ambulatory Visit | Attending: Certified Nurse Midwife | Admitting: Certified Nurse Midwife

## 2016-10-06 DIAGNOSIS — Z3A18 18 weeks gestation of pregnancy: Secondary | ICD-10-CM | POA: Insufficient documentation

## 2016-10-06 DIAGNOSIS — Z3482 Encounter for supervision of other normal pregnancy, second trimester: Secondary | ICD-10-CM

## 2016-10-06 DIAGNOSIS — Z363 Encounter for antenatal screening for malformations: Secondary | ICD-10-CM | POA: Insufficient documentation

## 2016-10-07 ENCOUNTER — Other Ambulatory Visit: Payer: Self-pay | Admitting: Certified Nurse Midwife

## 2016-10-07 DIAGNOSIS — Z3480 Encounter for supervision of other normal pregnancy, unspecified trimester: Secondary | ICD-10-CM

## 2016-10-07 DIAGNOSIS — O0992 Supervision of high risk pregnancy, unspecified, second trimester: Secondary | ICD-10-CM

## 2016-10-07 NOTE — Progress Notes (Unsigned)
Message left for [facility] to call back. She is already on Abilify 15 mg.  They were wanting to add Trazodone to her medication regimen.  Asked if there was an alternative such as Ambien. Stark Jock CNM

## 2016-10-08 ENCOUNTER — Other Ambulatory Visit: Payer: Self-pay | Admitting: Certified Nurse Midwife

## 2016-10-14 ENCOUNTER — Other Ambulatory Visit: Payer: Self-pay

## 2016-10-16 ENCOUNTER — Encounter: Payer: Self-pay | Admitting: Obstetrics & Gynecology

## 2016-10-29 ENCOUNTER — Encounter (HOSPITAL_COMMUNITY): Payer: Self-pay | Admitting: *Deleted

## 2016-10-29 ENCOUNTER — Inpatient Hospital Stay (HOSPITAL_COMMUNITY)
Admission: AD | Admit: 2016-10-29 | Discharge: 2016-10-29 | Disposition: A | Discharge status: Home / Self Care | Payer: Medicare Other | Source: Ambulatory Visit | Attending: Obstetrics and Gynecology | Admitting: Obstetrics and Gynecology

## 2016-10-29 DIAGNOSIS — E860 Dehydration: Secondary | ICD-10-CM | POA: Diagnosis not present

## 2016-10-29 DIAGNOSIS — R42 Dizziness and giddiness: Secondary | ICD-10-CM | POA: Diagnosis not present

## 2016-10-29 DIAGNOSIS — O26892 Other specified pregnancy related conditions, second trimester: Secondary | ICD-10-CM | POA: Diagnosis not present

## 2016-10-29 DIAGNOSIS — Z3A22 22 weeks gestation of pregnancy: Secondary | ICD-10-CM | POA: Diagnosis not present

## 2016-10-29 DIAGNOSIS — O26812 Pregnancy related exhaustion and fatigue, second trimester: Secondary | ICD-10-CM | POA: Diagnosis not present

## 2016-10-29 LAB — URINALYSIS, ROUTINE W REFLEX MICROSCOPIC
Glucose, UA: NEGATIVE mg/dL
Hgb urine dipstick: NEGATIVE
Ketones, ur: 15 mg/dL — AB
Nitrite: NEGATIVE
Protein, ur: NEGATIVE mg/dL
Specific Gravity, Urine: >1.03 — ABNORMAL HIGH (ref 1.005–1.030)
pH: 5.5 (ref 5.0–8.0)

## 2016-10-29 LAB — URINE MICROSCOPIC-ADD ON
Bacteria, UA: NONE SEEN
RBC / HPF: NONE SEEN RBC/hpf (ref 0–5)

## 2016-10-29 NOTE — MAU Provider Note (Signed)
History   Patient reports no BM in 3 days, micturation every 10 minutes, episodes of lightheadedness/vertigo/feeling as though she was going to faint; yesterday this feeling was improved with lying down, today it improved with caffeine, then got progressively worse. Denies vaginal discharge/water breaking/vaginal bleeding. Patient states she had not eaten much today or taken in enough water.   CSN: [id number]  Arrival date and time: 10/29/16 1617   None     Chief Complaint  Patient presents with  . Fatigue   HPI  OB History    Gravida Para Term Preterm AB Living   4 1 1   2 1    SAB TAB Ectopic Multiple Live Births         0 1      Past Medical History:  Diagnosis Date  . Anxiety   . Asthma    inhaler PRN-hasn't used inhaler in months  . Attention deficit hyperactivity disorder   . Bipolar disorder [facility])    hospitalized as a teen for suicidal ideations and cutting  . Depression   . Gastritis   . GERD (gastroesophageal reflux disease)   . HPV (human papilloma virus) anogenital infection   . Hx of varicella   . Mild preeclampsia 02/09/2016  . Vaginal Pap smear, abnormal     Past Surgical History:  Procedure Laterality Date  . EYE MUSCLE SURGERY Bilateral    07/29/12  . MOUTH SURGERY    . PLANTAR FASCIA SURGERY      Family History  Problem Relation Age of Onset  . Depression Mother   . Colon cancer Mother   . Colon polyps Mother   . Depression Father   . Depression Brother   . Liver disease    . Kidney disease      Social History  Substance Use Topics  . Smoking status: Never Smoker  . Smokeless tobacco: Never Used  . Alcohol use No    Allergies:  Allergies  Allergen Reactions  . Depakote [Divalproex Sodium] Other (See Comments)    Pt states that this medication makes her blood levels toxic.    [name] Methylphenidate Derivatives Other (See Comments)    Reaction:  Depression and anger   . Neurontin [Gabapentin] Other (See Comments)    Reaction:   Dizziness   . Prozac [Fluoxetine Hcl] Other (See Comments)    Reaction:  Anger     Prescriptions Prior to Admission  Medication Sig Dispense Refill Last Dose  . ARIPiprazole (ABILIFY) 15 MG tablet Take 15 mg by mouth at bedtime.    Past Week at Unknown time  . Prenatal Vit-Fe Fumarate-FA (PRENATAL MULTIVITAMIN) TABS tablet Take 1 tablet by mouth at bedtime.    10/28/2016 at Unknown time    Review of Systems  Constitutional: Positive for diaphoresis.  Gastrointestinal: Negative for abdominal pain, constipation, diarrhea, nausea and vomiting.  Genitourinary: Positive for frequency and urgency. Negative for dysuria, flank pain and hematuria.   Physical Exam   Blood pressure 114/68, pulse 91, temperature 98 F (36.7 C), temperature source Oral, resp. rate 16, last menstrual period 05/12/2016, unknown if currently breastfeeding.  Physical Exam  MAU Course  Procedures  MDM UA showed 15 ketones, patient could potentially be dehydrated. Patient states she was feeling much better after eating a meal provided to her. Increased water intake was encouraged.   Assessment and Plan  1. Lightheadedness - Advised patient to eat adequate meals throughout the day and increase her water intake.   Joelene Millin Sharna Gabrys 10/29/2016, 5:10  PM  

## 2016-10-29 NOTE — MAU Note (Addendum)
States she feels weak and tired and this is how she felt when she had high BP with last pregnancy and had to be delivered early. States she had some spotting this AM. Had intercourse last night.

## 2016-10-29 NOTE — MAU Provider Note (Signed)
History     CSN: [id number]  Arrival date and time: 10/29/16 1617   First Provider Initiated Contact with Patient 10/29/16 1728      Chief Complaint  Patient presents with  . Fatigue   HPI   [name] is a 30 y.o. female [phone] @ [redacted]w[redacted]d here in MAU with a 2 day history of dizziness/ feeling like she is going to pass out. She did not pass out, just felt like she may. The feeling improved when she laid down shortly after. She denies vaginal bleeding, leaking of water. Has felt the baby move.  Denies history of anemia. "I haven't eaten much today".   OB History    Gravida Para Term Preterm AB Living   4 1 1   2 1    SAB TAB Ectopic Multiple Live Births         0 1      Past Medical History:  Diagnosis Date  . Anxiety   . Asthma    inhaler PRN-hasn't used inhaler in months  . Attention deficit hyperactivity disorder   . Bipolar disorder [facility])    hospitalized as a teen for suicidal ideations and cutting  . Depression   . Gastritis   . GERD (gastroesophageal reflux disease)   . HPV (human papilloma virus) anogenital infection   . Hx of varicella   . Mild preeclampsia 02/09/2016  . Vaginal Pap smear, abnormal     Past Surgical History:  Procedure Laterality Date  . EYE MUSCLE SURGERY Bilateral    07/29/12  . MOUTH SURGERY    . PLANTAR FASCIA SURGERY      Family History  Problem Relation Age of Onset  . Depression Mother   . Colon cancer Mother   . Colon polyps Mother   . Depression Father   . Depression Brother   . Liver disease    . Kidney disease      Social History  Substance Use Topics  . Smoking status: Never Smoker  . Smokeless tobacco: Never Used  . Alcohol use No    Allergies:  Allergies  Allergen Reactions  . Depakote [Divalproex Sodium] Other (See Comments)    Pt states that this medication makes her blood levels toxic.    [name] Methylphenidate Derivatives Other (See Comments)    Reaction:  Depression and anger   . Neurontin  [Gabapentin] Other (See Comments)    Reaction:  Dizziness   . Prozac [Fluoxetine Hcl] Other (See Comments)    Reaction:  Anger     Prescriptions Prior to Admission  Medication Sig Dispense Refill Last Dose  . ARIPiprazole (ABILIFY) 15 MG tablet Take 15 mg by mouth at bedtime.    Past Week at Unknown time  . Prenatal Vit-Fe Fumarate-FA (PRENATAL MULTIVITAMIN) TABS tablet Take 1 tablet by mouth at bedtime.    10/28/2016 at Unknown time   Results for orders placed or performed during the hospital encounter of 10/29/16 (from the past 48 hour(s))  Urinalysis, Routine w reflex microscopic (not at [facility])     Status: Abnormal   Collection Time: 10/29/16  5:23 PM  Result Value Ref Range   Color, Urine YELLOW YELLOW   APPearance HAZY (A) CLEAR   Specific Gravity, Urine >1.030 (H) 1.005 - 1.030   pH 5.5 5.0 - 8.0   Glucose, UA NEGATIVE NEGATIVE mg/dL   Hgb urine dipstick NEGATIVE NEGATIVE   Bilirubin Urine SMALL (A) NEGATIVE   Ketones, ur 15 (A) NEGATIVE mg/dL   Protein,  ur NEGATIVE NEGATIVE mg/dL   Nitrite NEGATIVE NEGATIVE   Leukocytes, UA TRACE (A) NEGATIVE  Urine microscopic-add on     Status: Abnormal   Collection Time: 10/29/16  5:23 PM  Result Value Ref Range   Squamous Epithelial / LPF 0-5 (A) NONE SEEN   WBC, UA 0-5 0 - 5 WBC/hpf   RBC / HPF NONE SEEN 0 - 5 RBC/hpf   Bacteria, UA NONE SEEN NONE SEEN   Urine-Other MUCOUS PRESENT     Review of Systems  Constitutional: Positive for malaise/fatigue.  Gastrointestinal: Positive for nausea. Negative for abdominal pain and vomiting.  Genitourinary: Negative for dysuria, frequency, hematuria and urgency.  Neurological: Positive for dizziness (Occasional).   Physical Exam   Blood pressure 107/81, pulse 112, temperature 98 F (36.7 C), temperature source Oral, resp. rate 16, last menstrual period 05/12/2016, SpO2 100 %, unknown if currently breastfeeding.  Physical Exam  Constitutional: She is oriented to person, place, and time.  She appears well-developed and well-nourished. No distress.  Cardiovascular: Normal rate.   Respiratory: Effort normal.  GI: Soft. She exhibits no distension and no mass. There is no tenderness. There is no rebound and no guarding.  Genitourinary:  Genitourinary Comments: Cervix: closed, thick, posterior   Neurological: She is alert and oriented to person, place, and time.  Skin: Skin is warm. She is not diaphoretic.  Psychiatric: Her speech is delayed. She is slowed. She expresses impulsivity.    MAU Course  Procedures  None  MDM  + fetal heart tones via doppler  HR increase from sitting to standing from 96 to 113, otherwise normal Pulse ox 100% on RA Patient immediately requesting meal tray, drinks and bus voucher.  Urine shows mild dehydration: meal tray provided, PO hydration encouraged, and 2 bus tickets provided.  Patient says she is ready to go home.   Assessment and Plan   A:  1. Episode of dizziness   2. Fatigue during pregnancy in second trimester   3. Mild dehydration     P:  Discharge home in stable condition Increase PO fluid intake Small, frequent meals Return to MAU if symptoms worsen Change positions slowly.   Lezlie Lye, NP 10/29/2016 7:15 PM

## 2016-10-29 NOTE — Discharge Instructions (Signed)
Dizziness Dizziness is a common problem. It is a feeling of unsteadiness or light-headedness. You may feel like you are about to faint. Dizziness can lead to injury if you stumble or fall. Anyone can become dizzy, but dizziness is more common in older adults. This condition can be caused by a number of things, including medicines, dehydration, or illness. Follow these instructions at home: Taking these steps may help with your condition: Eating and drinking   Drink enough fluid to keep your urine clear or pale yellow. This helps to keep you from becoming dehydrated. Try to drink more clear fluids, such as water.  Do not drink alcohol.  Limit your caffeine intake if directed by your health care provider.  Limit your salt intake if directed by your health care provider. Activity   Avoid making quick movements.  Rise slowly from chairs and steady yourself until you feel okay.  In the morning, first sit up on the side of the bed. When you feel okay, stand slowly while you hold onto something until you know that your balance is fine.  Move your legs often if you need to stand in one place for a long time. Tighten and relax your muscles in your legs while you are standing.  Do not drive or operate heavy machinery if you feel dizzy.  Avoid bending down if you feel dizzy. Place items in your home so that they are easy for you to reach without leaning over. Lifestyle   Do not use any tobacco products, including cigarettes, chewing tobacco, or electronic cigarettes. If you need help quitting, ask your health care provider.  Try to reduce your stress level, such as with yoga or meditation. Talk with your health care provider if you need help. General instructions   Watch your dizziness for any changes.  Take medicines only as directed by your health care provider. Talk with your health care provider if you think that your dizziness is caused by a medicine that you are taking.  Tell a friend  or a family member that you are feeling dizzy. If he or she notices any changes in your behavior, have this person call your health care provider.  Keep all follow-up visits as directed by your health care provider. This is important. Contact a health care provider if:  Your dizziness does not go away.  Your dizziness or light-headedness gets worse.  You feel nauseous.  You have reduced hearing.  You have new symptoms.  You are unsteady on your feet or you feel like the room is spinning. Get help right away if:  You vomit or have diarrhea and are unable to eat or drink anything.  You have problems talking, walking, swallowing, or using your arms, hands, or legs.  You feel generally weak.  You are not thinking clearly or you have trouble forming sentences. It may take a friend or family member to notice this.  You have chest pain, abdominal pain, shortness of breath, or sweating.  Your vision changes.  You notice any bleeding.  You have a headache.  You have neck pain or a stiff neck.  You have a fever. This information is not intended to replace advice given to you by your health care provider. Make sure you discuss any questions you have with your health care provider. Document Released: 05/27/2001 Document Revised: 05/08/2016 Document Reviewed: 11/27/2014 Elsevier Interactive Patient Education  2017 Elsevier Inc.  

## 2016-11-03 ENCOUNTER — Encounter (HOSPITAL_COMMUNITY): Payer: Self-pay

## 2016-11-04 ENCOUNTER — Ambulatory Visit (HOSPITAL_COMMUNITY)
Admission: RE | Admit: 2016-11-04 | Discharge: 2016-11-04 | Disposition: A | Discharge status: Home / Self Care | Payer: Medicare Other | Source: Ambulatory Visit | Attending: Certified Nurse Midwife | Admitting: Certified Nurse Midwife

## 2016-11-04 ENCOUNTER — Encounter (HOSPITAL_COMMUNITY): Payer: Self-pay

## 2016-11-04 DIAGNOSIS — Z3A22 22 weeks gestation of pregnancy: Secondary | ICD-10-CM | POA: Insufficient documentation

## 2016-11-04 DIAGNOSIS — Z362 Encounter for other antenatal screening follow-up: Secondary | ICD-10-CM | POA: Insufficient documentation

## 2016-11-04 DIAGNOSIS — O0992 Supervision of high risk pregnancy, unspecified, second trimester: Secondary | ICD-10-CM

## 2016-11-04 NOTE — Addendum Note (Signed)
Encounter addended by: Jill Poling, RT on: 11/04/2016  1:41 PM<BR>    Actions taken: Imaging Exam ended

## 2016-11-05 ENCOUNTER — Encounter (HOSPITAL_COMMUNITY): Payer: Self-pay | Admitting: *Deleted

## 2016-11-05 ENCOUNTER — Inpatient Hospital Stay (HOSPITAL_COMMUNITY)
Admission: AD | Admit: 2016-11-05 | Discharge: 2016-11-05 | Disposition: A | Discharge status: Home / Self Care | Payer: Medicare Other | Source: Ambulatory Visit | Attending: Family Medicine | Admitting: Family Medicine

## 2016-11-05 DIAGNOSIS — Z9889 Other specified postprocedural states: Secondary | ICD-10-CM | POA: Insufficient documentation

## 2016-11-05 DIAGNOSIS — O99512 Diseases of the respiratory system complicating pregnancy, second trimester: Secondary | ICD-10-CM | POA: Diagnosis not present

## 2016-11-05 DIAGNOSIS — R42 Dizziness and giddiness: Secondary | ICD-10-CM | POA: Diagnosis not present

## 2016-11-05 DIAGNOSIS — Z8371 Family history of colonic polyps: Secondary | ICD-10-CM | POA: Insufficient documentation

## 2016-11-05 DIAGNOSIS — O219 Vomiting of pregnancy, unspecified: Secondary | ICD-10-CM

## 2016-11-05 DIAGNOSIS — Z800 Family history of malignant neoplasm of digestive organs: Secondary | ICD-10-CM | POA: Diagnosis not present

## 2016-11-05 DIAGNOSIS — Z888 Allergy status to other drugs, medicaments and biological substances status: Secondary | ICD-10-CM | POA: Diagnosis not present

## 2016-11-05 DIAGNOSIS — O99612 Diseases of the digestive system complicating pregnancy, second trimester: Secondary | ICD-10-CM | POA: Diagnosis not present

## 2016-11-05 DIAGNOSIS — Z818 Family history of other mental and behavioral disorders: Secondary | ICD-10-CM | POA: Insufficient documentation

## 2016-11-05 DIAGNOSIS — O26892 Other specified pregnancy related conditions, second trimester: Secondary | ICD-10-CM | POA: Diagnosis present

## 2016-11-05 DIAGNOSIS — R112 Nausea with vomiting, unspecified: Secondary | ICD-10-CM | POA: Insufficient documentation

## 2016-11-05 DIAGNOSIS — Z3A23 23 weeks gestation of pregnancy: Secondary | ICD-10-CM | POA: Insufficient documentation

## 2016-11-05 DIAGNOSIS — R404 Transient alteration of awareness: Secondary | ICD-10-CM | POA: Diagnosis not present

## 2016-11-05 DIAGNOSIS — O99342 Other mental disorders complicating pregnancy, second trimester: Secondary | ICD-10-CM | POA: Insufficient documentation

## 2016-11-05 LAB — CBC
HCT: 33.2 % — ABNORMAL LOW (ref 36.0–46.0)
Hemoglobin: 11 g/dL — ABNORMAL LOW (ref 12.0–15.0)
MCH: 29.6 pg (ref 26.0–34.0)
MCHC: 33.1 g/dL (ref 30.0–36.0)
MCV: 89.2 fL (ref 78.0–100.0)
Platelets: 153 10*3/uL (ref 150–400)
RBC: 3.72 MIL/uL — ABNORMAL LOW (ref 3.87–5.11)
RDW: 15.3 % (ref 11.5–15.5)
WBC: 6.8 10*3/uL (ref 4.0–10.5)

## 2016-11-05 LAB — URINALYSIS, ROUTINE W REFLEX MICROSCOPIC
Glucose, UA: NEGATIVE mg/dL
Hgb urine dipstick: NEGATIVE
Ketones, ur: 15 mg/dL — AB
Nitrite: NEGATIVE
Protein, ur: NEGATIVE mg/dL
Specific Gravity, Urine: 1.025 (ref 1.005–1.030)
pH: 6 (ref 5.0–8.0)

## 2016-11-05 LAB — URINE MICROSCOPIC-ADD ON

## 2016-11-05 MED ORDER — ONDANSETRON 4 MG PO TBDP: 4.0000 mg | ORAL_TABLET | Freq: Three times a day (TID) | ORAL | 0 refills | Status: DC | PRN

## 2016-11-05 NOTE — MAU Provider Note (Signed)
History     CSN: [id number]  Arrival date and time: 11/05/16 1352   First Provider Initiated Contact with Patient 11/05/16 1530      Chief Complaint  Patient presents with  . Nausea  . Dizziness   HPI  [name] is 30 y.o. female [phone] @ [redacted]w[redacted]d here in MAU with nausea and dizziness. The nausea occurs in the morning only and after she vomits 1 X she feels a lot better.  The dizziness occurs only when she gets too hot. If she is not hot she does not have dizziness.   Patient requesting food at this time.   OB History    Gravida Para Term Preterm AB Living   4 1 1   2 1    SAB TAB Ectopic Multiple Live Births         0 1      Past Medical History:  Diagnosis Date  . Anxiety   . Asthma    inhaler PRN-hasn't used inhaler in months  . Attention deficit hyperactivity disorder   . Bipolar disorder [facility])    hospitalized as a teen for suicidal ideations and cutting  . Depression   . Gastritis   . GERD (gastroesophageal reflux disease)   . HPV (human papilloma virus) anogenital infection   . Hx of varicella   . Mild preeclampsia 02/09/2016  . Vaginal Pap smear, abnormal     Past Surgical History:  Procedure Laterality Date  . EYE MUSCLE SURGERY Bilateral    07/29/12  . MOUTH SURGERY    . PLANTAR FASCIA SURGERY      Family History  Problem Relation Age of Onset  . Depression Mother   . Colon cancer Mother   . Colon polyps Mother   . Depression Father   . Depression Brother   . Liver disease    . Kidney disease      Social History  Substance Use Topics  . Smoking status: Never Smoker  . Smokeless tobacco: Never Used  . Alcohol use No    Allergies:  Allergies  Allergen Reactions  . Depakote [Divalproex Sodium] Other (See Comments)    Pt states that this medication makes her blood levels toxic.    [name] Methylphenidate Derivatives Other (See Comments)    Reaction:  Depression and anger   . Neurontin [Gabapentin] Other (See Comments)   Reaction:  Dizziness   . Prozac [Fluoxetine Hcl] Other (See Comments)    Reaction:  Anger     Prescriptions Prior to Admission  Medication Sig Dispense Refill Last Dose  . Prenatal Vit-Fe Fumarate-FA (PRENATAL MULTIVITAMIN) TABS tablet Take 1 tablet by mouth at bedtime.    Taking   Results for orders placed or performed during the hospital encounter of 11/05/16 (from the past 48 hour(s))  Urinalysis, Routine w reflex microscopic (not at [facility])     Status: Abnormal   Collection Time: 11/05/16  1:52 PM  Result Value Ref Range   Color, Urine YELLOW YELLOW   APPearance CLEAR CLEAR   Specific Gravity, Urine 1.025 1.005 - 1.030   pH 6.0 5.0 - 8.0   Glucose, UA NEGATIVE NEGATIVE mg/dL   Hgb urine dipstick NEGATIVE NEGATIVE   Bilirubin Urine SMALL (A) NEGATIVE   Ketones, ur 15 (A) NEGATIVE mg/dL   Protein, ur NEGATIVE NEGATIVE mg/dL   Nitrite NEGATIVE NEGATIVE   Leukocytes, UA TRACE (A) NEGATIVE  Urine microscopic-add on     Status: Abnormal   Collection Time: 11/05/16  1:52 PM  Result Value Ref Range   Squamous Epithelial / LPF 6-30 (A) NONE SEEN   WBC, UA 6-30 0 - 5 WBC/hpf   RBC / HPF 0-5 0 - 5 RBC/hpf   Bacteria, UA MANY (A) NONE SEEN   Urine-Other MUCOUS PRESENT   CBC     Status: Abnormal   Collection Time: 11/05/16  3:36 PM  Result Value Ref Range   WBC 6.8 4.0 - 10.5 K/uL   RBC 3.72 (L) 3.87 - 5.11 MIL/uL   Hemoglobin 11.0 (L) 12.0 - 15.0 g/dL   HCT 33.2 (L) 36.0 - 46.0 %   MCV 89.2 78.0 - 100.0 fL   MCH 29.6 26.0 - 34.0 pg   MCHC 33.1 30.0 - 36.0 g/dL   RDW 15.3 11.5 - 15.5 %   Platelets 153 150 - 400 K/uL    Review of Systems  Constitutional: Negative for chills and fever.  Gastrointestinal: Positive for nausea. Negative for abdominal pain and vomiting.  Neurological: Positive for dizziness.   Physical Exam   Blood pressure 127/86, pulse 91, temperature 98 F (36.7 C), temperature source Oral, resp. rate 18, weight 199 lb 12.8 oz (90.6 kg), last menstrual period  05/12/2016, SpO2 100 %, unknown if currently breastfeeding.  Physical Exam  Constitutional: She is oriented to person, place, and time. She appears well-developed and well-nourished. No distress.  Cardiovascular: Normal rate and normal heart sounds.   Respiratory: Effort normal. No respiratory distress. She has no wheezes.  GI: Soft.  Musculoskeletal: Normal range of motion.  Neurological: She is alert and oriented to person, place, and time.  Skin: Skin is warm. She is not diaphoretic.  Psychiatric: Her behavior is normal.    MAU Course  Procedures  None  MDM  Orthostatic vitals: normal  Urine shows mild dehydration:PO hydration in MAU.  Cbc  NST difficult due to gestational age; patient non-compliant with fetal monitoring; not able to sit still in the bed per the patient.   Assessment and Plan   A:  1. Spell of dizziness   2. Nausea/vomiting in pregnancy     P:  Discharge home in stable condition Rx: Zofran  Increase PO fluid  Return to MAU for emergencies only  Discussed the proper use of MAU.  Small, frequent meals    Lezlie Lye, NP 11/05/2016 .7:14 PM

## 2016-11-05 NOTE — Discharge Instructions (Signed)
Dizziness Dizziness is a common problem. It makes you feel unsteady or lightheaded. You may feel like you are about to pass out (faint). Dizziness can lead to injury if you stumble or fall. Anyone can get dizzy, but dizziness is more common in older adults. This condition can be caused by a number of things, including:  Medicines.  Dehydration.  Illness. Follow these instructions at home: Following these instructions may help with your condition: Eating and drinking  Drink enough fluid to keep your pee (urine) clear or pale yellow. This helps to keep you from getting dehydrated. Try to drink more clear fluids, such as water.  Do not drink alcohol.  Limit how much caffeine you drink or eat if told by your doctor.  Limit how much salt you drink or eat if told by your doctor. Activity  Avoid making quick movements.  When you stand up from sitting in a chair, steady yourself until you feel okay.  In the morning, first sit up on the side of the bed. When you feel okay, stand slowly while you hold onto something. Do this until you know that your balance is fine.  Move your legs often if you need to stand in one place for a long time. Tighten and relax your muscles in your legs while you are standing.  Do not drive or use heavy machinery if you feel dizzy.  Avoid bending down if you feel dizzy. Place items in your home so that they are easy for you to reach without leaning over. Lifestyle  Do not use any tobacco products, including cigarettes, chewing tobacco, or electronic cigarettes. If you need help quitting, ask your doctor.  Try to lower your stress level, such as with yoga or meditation. Talk with your doctor if you need help. General instructions  Watch your dizziness for any changes.  Take medicines only as told by your doctor. Talk with your doctor if you think that your dizziness is caused by a medicine that you are taking.  Tell a friend or a family member that you are  feeling dizzy. If he or she notices any changes in your behavior, have this person call your doctor.  Keep all follow-up visits as told by your doctor. This is important. Contact a doctor if:  Your dizziness does not go away.  Your dizziness or light-headedness gets worse.  You feel sick to your stomach (nauseous).  You have trouble hearing.  You have new symptoms.  You are unsteady on your feet or you feel like the room is spinning. Get help right away if:  You throw up (vomit) or have diarrhea and are unable to eat or drink anything.  You have trouble:  Talking.  Walking.  Swallowing.  Using your arms, hands, or legs.  You feel generally weak.  You are not thinking clearly or you have trouble forming sentences. It may take a friend or family member to notice this.  You have:  Chest pain.  Pain in your belly (abdomen).  Shortness of breath.  Sweating.  Your vision changes.  You are bleeding.  You have a headache.  You have neck pain or a stiff neck.  You have a fever. This information is not intended to replace advice given to you by your health care provider. Make sure you discuss any questions you have with your health care provider. Document Released: 11/20/2011 Document Revised: 05/08/2016 Document Reviewed: 11/27/2014 Elsevier Interactive Patient Education  2017 Elsevier Inc.  Nausea, Adult Nausea  is the feeling of an upset stomach or having to vomit. Nausea on its own is not usually a serious concern, but it may be an early sign of a more serious medical problem. As nausea gets worse, it can lead to vomiting. If vomiting develops, or if you are not able to drink enough fluids, you are at risk of becoming dehydrated. Dehydration can make you tired and thirsty, cause you to have a dry mouth, and decrease how often you urinate. Older adults and people with other diseases or a weak immune system are at higher risk for dehydration. The main goals of  treating your nausea are:  To limit repeated nausea episodes.  To prevent vomiting and dehydration. Follow these instructions at home: Follow instructions from your health care provider about how to care for yourself at home. Eating and drinking Follow these recommendations as told by your health care provider:  Take an oral rehydration solution (ORS). This is a drink that is sold at pharmacies and retail stores.  Drink clear fluids in small amounts as you are able. Clear fluids include water, ice chips, diluted fruit juice, and low-calorie sports drinks.  Eat bland, easy-to-digest foods in small amounts as you are able. These foods include bananas, applesauce, rice, lean meats, toast, and crackers.  Avoid drinking fluids that contain a lot of sugar or caffeine, such as energy drinks, sports drinks, and soda.  Avoid alcohol.  Avoid spicy or fatty foods. General instructions  Drink enough fluid to keep your urine clear or pale yellow.  Wash your hands often. If soap and water are not available, use hand sanitizer.  Make sure that all people in your household wash their hands well and often.  Rest at home while you recover.  Take over-the-counter and prescription medicines only as told by your health care provider.  Breathe slowly and deeply when you feel nauseous.  Watch your condition for any changes.  Keep all follow-up visits as told by your health care provider. This is important. Contact a health care provider if:  You have a headache.  You have new symptoms.  Your nausea gets worse.  You have a fever.  You feel light-headed or dizzy.  You vomit.  You cannot keep fluids down. Get help right away if:  You have pain in your chest, neck, arm, or jaw.  You feel extremely weak or you faint.  You have vomit that is bright red or looks like coffee grounds.  You have bloody or black stools or stools that look like tar.  You have a severe headache, a stiff  neck, or both.  You have severe pain, cramping, or bloating in your abdomen.  You have a rash.  You have difficulty breathing or are breathing very quickly.  Your heart is beating very quickly.  Your skin feels cold and clammy.  You feel confused.  You have pain when you urinate.  You have signs of dehydration, such as:  Dark urine, very little, or no urine.  Cracked lips.  Dry mouth.  Sunken eyes.  Sleepiness.  Weakness. These symptoms may represent a serious problem that is an emergency. Do not wait to see if the symptoms will go away. Get medical help right away. Call your local emergency services (911 in the [place] Do not drive yourself to the hospital.  This information is not intended to replace advice given to you by your health care provider. Make sure you discuss any questions you have with your health  care provider. Document Released: 01/08/2005 Document Revised: 05/05/2016 Document Reviewed: 08/07/2015 Elsevier Interactive Patient Education  2017 [employer].

## 2016-11-05 NOTE — MAU Note (Signed)
Really nauseous, light headed, and feels like she is going to faint.  Was here a week ago, hasn't changed.

## 2016-11-07 LAB — CULTURE, OB URINE: Special Requests: NORMAL

## 2016-11-16 ENCOUNTER — Encounter (HOSPITAL_COMMUNITY): Payer: Self-pay | Admitting: *Deleted

## 2016-11-16 ENCOUNTER — Inpatient Hospital Stay (HOSPITAL_COMMUNITY)
Admission: AD | Admit: 2016-11-16 | Discharge: 2016-11-16 | Disposition: A | Discharge status: Home / Self Care | Payer: Medicare Other | Source: Ambulatory Visit | Attending: Obstetrics & Gynecology | Admitting: Obstetrics & Gynecology

## 2016-11-16 DIAGNOSIS — Z3A24 24 weeks gestation of pregnancy: Secondary | ICD-10-CM

## 2016-11-16 DIAGNOSIS — O368120 Decreased fetal movements, second trimester, not applicable or unspecified: Secondary | ICD-10-CM | POA: Diagnosis present

## 2016-11-16 DIAGNOSIS — O368121 Decreased fetal movements, second trimester, fetus 1: Secondary | ICD-10-CM

## 2016-11-16 LAB — GLUCOSE, CAPILLARY: Glucose-Capillary: 107 mg/dL — ABNORMAL HIGH (ref 65–99)

## 2016-11-16 NOTE — MAU Note (Addendum)
Pt arrived via [facility].  Pt states that since Wednesday she has been feeling faint and today she has been having decreased fetal movement.

## 2016-11-16 NOTE — Discharge Instructions (Signed)
Introduction Patient Name: ________________________________________________ Patient Due Date: ____________________ What is a fetal movement count? A fetal movement count is the number of times that you feel your baby move during a certain amount of time. This may also be called a fetal kick count. A fetal movement count is recommended for every pregnant woman. You may be asked to start counting fetal movements as early as week 28 of your pregnancy. Pay attention to when your baby is most active. You may notice your baby's sleep and wake cycles. You may also notice things that make your baby move more. You should do a fetal movement count:  When your baby is normally most active.  At the same time each day. A good time to count movements is while you are resting, after having something to eat and drink. How do I count fetal movements? 1. Find a quiet, comfortable area. Sit, or lie down on your side. 2. Write down the date, the start time and stop time, and the number of movements that you felt between those two times. Take this information with you to your health care visits. 3. For 2 hours, count kicks, flutters, swishes, rolls, and jabs. You should feel at least 10 movements during 2 hours. 4. You may stop counting after you have felt 10 movements. 5. If you do not feel 10 movements in 2 hours, have something to eat and drink. Then, keep resting and counting for 1 hour. If you feel at least 4 movements during that hour, you may stop counting. Contact a health care provider if:  You feel fewer than 4 movements in 2 hours.  Your baby is not moving like he or she usually does. Date: ____________ Start time: ____________ Stop time: ____________ Movements: ____________ Date: ____________ Start time: ____________ Stop time: ____________ Movements: ____________ Date: ____________ Start time: ____________ Stop time: ____________ Movements: ____________ Date: ____________ Start time: ____________  Stop time: ____________ Movements: ____________ Date: ____________ Start time: ____________ Stop time: ____________ Movements: ____________ Date: ____________ Start time: ____________ Stop time: ____________ Movements: ____________ Date: ____________ Start time: ____________ Stop time: ____________ Movements: ____________ Date: ____________ Start time: ____________ Stop time: ____________ Movements: ____________ Date: ____________ Start time: ____________ Stop time: ____________ Movements: ____________ This information is not intended to replace advice given to you by your health care provider. Make sure you discuss any questions you have with your health care provider. Document Released: 12/31/2006 Document Revised: 07/30/2016 Document Reviewed: 01/10/2016 Elsevier Interactive Patient Education  2017 Elsevier Inc.  

## 2016-11-16 NOTE — MAU Provider Note (Signed)
History   [id number] @ 24.4 wks in with decreased fetal movement. Pt is very vague with number of movements she had today but states since being placed on monitor baby has become active.  CSN: [id number]  Arrival date & time 11/16/16  1706   None     Chief Complaint  Patient presents with  . Decreased Fetal Movement    HPI  Past Medical History:  Diagnosis Date  . Anxiety   . Asthma    inhaler PRN-hasn't used inhaler in months  . Attention deficit hyperactivity disorder   . Bipolar disorder [facility])    hospitalized as a teen for suicidal ideations and cutting  . Depression   . Gastritis   . GERD (gastroesophageal reflux disease)   . HPV (human papilloma virus) anogenital infection   . Hx of varicella   . Mild preeclampsia 02/09/2016  . Vaginal Pap smear, abnormal     Past Surgical History:  Procedure Laterality Date  . EYE MUSCLE SURGERY Bilateral    07/29/12  . MOUTH SURGERY    . PLANTAR FASCIA SURGERY      Family History  Problem Relation Age of Onset  . Depression Mother   . Colon cancer Mother   . Colon polyps Mother   . Depression Father   . Depression Brother   . Liver disease    . Kidney disease      Social History  Substance Use Topics  . Smoking status: Never Smoker  . Smokeless tobacco: Never Used  . Alcohol use No    OB History    Gravida Para Term Preterm AB Living   4 1 1   2 1    SAB TAB Ectopic Multiple Live Births         0 1      Review of Systems  Constitutional: Negative.   HENT: Negative.   Eyes: Negative.   Respiratory: Negative.   Cardiovascular: Negative.   Gastrointestinal: Negative.   Endocrine: Negative.   Genitourinary: Negative.   Musculoskeletal: Negative.   Skin: Negative.   Allergic/Immunologic: Negative.   Neurological: Negative.   Hematological: Negative.   Psychiatric/Behavioral: Negative.     Allergies  Depakote [divalproex sodium]; Methylphenidate derivatives; Neurontin [gabapentin]; and Prozac [fluoxetine  hcl]  Home Medications    BP 114/74 (BP Location: Left Arm)   Pulse 90   Temp 98.1 F (36.7 C) (Oral)   Resp 18   LMP 05/12/2016 (Approximate)   SpO2 97%   Physical Exam  Constitutional: She is oriented to person, place, and time. She appears well-developed and well-nourished.  HENT:  Head: Normocephalic.  Eyes: Pupils are equal, round, and reactive to light.  Neck: Normal range of motion.  Cardiovascular: Normal rate, regular rhythm, normal heart sounds and intact distal pulses.   Pulmonary/Chest: Effort normal and breath sounds normal.  Abdominal: Soft. Bowel sounds are normal.  Musculoskeletal: Normal range of motion.  Neurological: She is alert and oriented to person, place, and time. She has normal reflexes.  Skin: Skin is warm and dry.  Psychiatric: She has a normal mood and affect. Her behavior is normal. Judgment and thought content normal.    MAU Course  Procedures (including critical care time)  Labs Reviewed - No data to display No results found.   1. Decreased fetal movement affecting management of pregnancy in second trimester, fetus 1       MDM  FHR pattern reassuring, fetus active and will d/c home. Reviewed fetal kick counts with pt.

## 2016-11-17 ENCOUNTER — Other Ambulatory Visit: Payer: Self-pay | Admitting: Certified Nurse Midwife

## 2016-11-17 DIAGNOSIS — Z3480 Encounter for supervision of other normal pregnancy, unspecified trimester: Secondary | ICD-10-CM

## 2016-11-20 ENCOUNTER — Encounter: Payer: Medicare Other | Admitting: Certified Nurse Midwife

## 2016-11-20 ENCOUNTER — Inpatient Hospital Stay (HOSPITAL_COMMUNITY)
Admission: AD | Admit: 2016-11-20 | Discharge: 2016-11-20 | Discharge status: Against Medical Advice | Payer: Medicare Other | Source: Ambulatory Visit | Attending: Obstetrics and Gynecology | Admitting: Obstetrics and Gynecology

## 2016-11-20 DIAGNOSIS — Z5321 Procedure and treatment not carried out due to patient leaving prior to being seen by health care provider: Secondary | ICD-10-CM | POA: Insufficient documentation

## 2016-11-20 DIAGNOSIS — R531 Weakness: Secondary | ICD-10-CM | POA: Diagnosis not present

## 2016-11-20 DIAGNOSIS — R404 Transient alteration of awareness: Secondary | ICD-10-CM | POA: Diagnosis not present

## 2016-11-20 DIAGNOSIS — Z3480 Encounter for supervision of other normal pregnancy, unspecified trimester: Secondary | ICD-10-CM

## 2016-11-20 NOTE — MAU Note (Signed)
Pt reports she is feeling dizzy and light headed. Only had a cookie to eat  This morning said she was not hungry. Denies n/v.

## 2016-11-20 NOTE — MAU Note (Signed)
Pt stated she is feeling better now. Wants to leave and not be seen by MD. Had pt sigh AMA form

## 2016-11-25 ENCOUNTER — Inpatient Hospital Stay (HOSPITAL_COMMUNITY)
Admission: AD | Admit: 2016-11-25 | Discharge: 2016-11-25 | Disposition: A | Discharge status: Home / Self Care | Payer: Medicare Other | Source: Ambulatory Visit | Attending: Obstetrics and Gynecology | Admitting: Obstetrics and Gynecology

## 2016-11-25 ENCOUNTER — Encounter (HOSPITAL_COMMUNITY): Payer: Self-pay | Admitting: *Deleted

## 2016-11-25 DIAGNOSIS — B3731 Acute candidiasis of vulva and vagina: Secondary | ICD-10-CM

## 2016-11-25 DIAGNOSIS — B373 Candidiasis of vulva and vagina: Secondary | ICD-10-CM | POA: Diagnosis not present

## 2016-11-25 DIAGNOSIS — O98812 Other maternal infectious and parasitic diseases complicating pregnancy, second trimester: Secondary | ICD-10-CM | POA: Insufficient documentation

## 2016-11-25 DIAGNOSIS — Z3A25 25 weeks gestation of pregnancy: Secondary | ICD-10-CM | POA: Diagnosis not present

## 2016-11-25 DIAGNOSIS — Z3492 Encounter for supervision of normal pregnancy, unspecified, second trimester: Secondary | ICD-10-CM

## 2016-11-25 DIAGNOSIS — Z3480 Encounter for supervision of other normal pregnancy, unspecified trimester: Secondary | ICD-10-CM

## 2016-11-25 DIAGNOSIS — N898 Other specified noninflammatory disorders of vagina: Secondary | ICD-10-CM | POA: Diagnosis present

## 2016-11-25 LAB — URINALYSIS, ROUTINE W REFLEX MICROSCOPIC
Bilirubin Urine: NEGATIVE
Glucose, UA: NEGATIVE mg/dL
Hgb urine dipstick: NEGATIVE
Ketones, ur: NEGATIVE mg/dL
Nitrite: NEGATIVE
Protein, ur: 100 mg/dL — AB
Specific Gravity, Urine: 1.03 (ref 1.005–1.030)
pH: 6 (ref 5.0–8.0)

## 2016-11-25 LAB — WET PREP, GENITAL
Clue Cells Wet Prep HPF POC: NONE SEEN
Sperm: NONE SEEN
Trich, Wet Prep: NONE SEEN

## 2016-11-25 MED ORDER — FLUCONAZOLE 150 MG PO TABS: 150.0000 mg | ORAL_TABLET | Freq: Once | ORAL | 0 refills | Status: AC

## 2016-11-25 NOTE — Discharge Instructions (Signed)

## 2016-11-25 NOTE — MAU Note (Signed)
Leaking mucousy clear d/c since mid-morning along with fluid at times. Has bruise on R side of abd where dog jumped up on her yesterday. Denies bleeding. Having some lower back pain for 2 days

## 2016-11-25 NOTE — Progress Notes (Signed)
Pt said she had to leave right now, that she could not wait for her paperwork, she signed but did not wait for her paperwork.  Was going to be discharged but did not not stay to hear results.

## 2016-11-25 NOTE — MAU Provider Note (Signed)
History     CSN: [id number]  Arrival date and time: 11/25/16 1609   First Provider Initiated Contact with Patient 11/25/16 1719      Chief Complaint  Patient presents with  . Vaginal Discharge   [id number] @25 .6 weeks here with LOF. She reports losing mucous plug yesterday, describes as yellow and "looked like a booger". She reports leaking watery discharge since mid morning today, no gush, and small amt of fluid since. She denies VB. She reports good FM. She reports feeling one ctx this am.    OB History    Gravida Para Term Preterm AB Living   4 1 1   2 1    SAB TAB Ectopic Multiple Live Births         0 1      Past Medical History:  Diagnosis Date  . Anxiety   . Asthma    inhaler PRN-hasn't used inhaler in months  . Attention deficit hyperactivity disorder   . Bipolar disorder [facility])    hospitalized as a teen for suicidal ideations and cutting  . Depression   . Gastritis   . GERD (gastroesophageal reflux disease)   . HPV (human papilloma virus) anogenital infection   . Hx of varicella   . Mild preeclampsia 02/09/2016  . Vaginal Pap smear, abnormal     Past Surgical History:  Procedure Laterality Date  . EYE MUSCLE SURGERY Bilateral    07/29/12  . MOUTH SURGERY    . PLANTAR FASCIA SURGERY      Family History  Problem Relation Age of Onset  . Depression Mother   . Colon cancer Mother   . Colon polyps Mother   . Depression Father   . Depression Brother   . Liver disease    . Kidney disease      Social History  Substance Use Topics  . Smoking status: Never Smoker  . Smokeless tobacco: Never Used  . Alcohol use No    Allergies:  Allergies  Allergen Reactions  . Depakote [Divalproex Sodium] Other (See Comments)    Pt states that this medication makes her blood levels toxic.    [name] Methylphenidate Derivatives Other (See Comments)    Reaction:  Depression and anger   . Neurontin [Gabapentin] Other (See Comments)    Reaction:  Dizziness   . Prozac  [Fluoxetine Hcl] Other (See Comments)    Reaction:  Anger     Prescriptions Prior to Admission  Medication Sig Dispense Refill Last Dose  . ARIPiprazole (ABILIFY) 15 MG tablet Take 15 mg by mouth daily.    11/24/2016 at Unknown time  . Prenatal Vit-Fe Fumarate-FA (PRENATAL MULTIVITAMIN) TABS tablet Take 1 tablet by mouth at bedtime.    11/24/2016 at Unknown time  . ondansetron (ZOFRAN ODT) 4 MG disintegrating tablet Take 1 tablet (4 mg total) by mouth every 8 (eight) hours as needed for nausea or vomiting. (Patient not taking: Reported on 11/25/2016) 20 tablet 0 Not Taking at Unknown time    Review of Systems  Constitutional: Negative.   Gastrointestinal: Negative.    Physical Exam   Blood pressure 125/66, pulse 85, resp. rate 18, height 5\' 6"  (1.676 m), weight 90.4 kg (199 lb 6.4 oz), last menstrual period 05/12/2016, unknown if currently breastfeeding.  Physical Exam  Constitutional: She is oriented to person, place, and time. She appears well-developed and well-nourished. No distress.  HENT:  Head: Normocephalic and atraumatic.  Neck: Normal range of motion.  Cardiovascular: Normal rate.   Respiratory: Effort  normal.  GI: Soft. She exhibits no distension. There is no tenderness.  gravid  Genitourinary:  Genitourinary Comments: External: no lesions or erythema Vagina: rugated, parous, mod thin white discharge, no pool, fern negative SVE: closed/thick  Musculoskeletal: Normal range of motion.  Neurological: She is alert and oriented to person, place, and time.  Skin: Skin is warm and dry.  Psychiatric: She has a normal mood and affect.  EFM: 135 bpm, mod variability, + accels, no decels Toco: none Results for orders placed or performed during the hospital encounter of 11/25/16 (from the past 24 hour(s))  Urinalysis, Routine w reflex microscopic     Status: Abnormal   Collection Time: 11/25/16  4:50 PM  Result Value Ref Range   Color, Urine YELLOW YELLOW   APPearance CLOUDY  (A) CLEAR   Specific Gravity, Urine 1.030 1.005 - 1.030   pH 6.0 5.0 - 8.0   Glucose, UA NEGATIVE NEGATIVE mg/dL   Hgb urine dipstick NEGATIVE NEGATIVE   Bilirubin Urine NEGATIVE NEGATIVE   Ketones, ur NEGATIVE NEGATIVE mg/dL   Protein, ur 100 (A) NEGATIVE mg/dL   Nitrite NEGATIVE NEGATIVE   Leukocytes, UA LARGE (A) NEGATIVE   RBC / HPF 6-30 0 - 5 RBC/hpf   WBC, UA TOO NUMEROUS TO COUNT 0 - 5 WBC/hpf   Bacteria, UA MANY (A) NONE SEEN   Squamous Epithelial / LPF TOO NUMEROUS TO COUNT (A) NONE SEEN   Mucous PRESENT   Wet prep, genital     Status: Abnormal   Collection Time: 11/25/16  5:30 PM  Result Value Ref Range   Yeast Wet Prep HPF POC PRESENT (A) NONE SEEN   Trich, Wet Prep NONE SEEN NONE SEEN   Clue Cells Wet Prep HPF POC NONE SEEN NONE SEEN   WBC, Wet Prep HPF POC FEW (A) NONE SEEN   Sperm NONE SEEN     MAU Course  Procedures  MDM Labs ordered and reviewed. No evidence of SROM. Discussed with pt importance of regular prenatal visits vs. multiple MAU visits to ensure adequate care which includes necessary prenatal surveillance and testing. Pt left prior to discussion of wet prep results.    Assessment and Plan   1. Yeast vaginitis   2. Supervision of other normal pregnancy, antepartum   3. Second trimester pregnancy    Discharge home Follow up at [facility] as scheduled in 2 weeks Return to MAU for emergencies    Medication List    STOP taking these medications   ondansetron 4 MG disintegrating tablet Commonly known as:  ZOFRAN ODT     TAKE these medications   ARIPiprazole 15 MG tablet Commonly known as:  ABILIFY Take 15 mg by mouth daily.   fluconazole 150 MG tablet Commonly known as:  DIFLUCAN Take 1 tablet (150 mg total) by mouth once.   prenatal multivitamin Tabs tablet Take 1 tablet by mouth at bedtime.      Julianne Handler, CNM 11/25/2016, 5:22 PM

## 2016-12-01 ENCOUNTER — Encounter (HOSPITAL_COMMUNITY): Payer: Self-pay

## 2016-12-01 ENCOUNTER — Telehealth: Payer: Self-pay

## 2016-12-01 ENCOUNTER — Inpatient Hospital Stay (HOSPITAL_COMMUNITY)
Admission: AD | Admit: 2016-12-01 | Discharge: 2016-12-01 | Disposition: A | Discharge status: Home / Self Care | Payer: Medicare Other | Source: Ambulatory Visit | Attending: Family Medicine | Admitting: Family Medicine

## 2016-12-01 DIAGNOSIS — Z79899 Other long term (current) drug therapy: Secondary | ICD-10-CM | POA: Diagnosis not present

## 2016-12-01 DIAGNOSIS — O99512 Diseases of the respiratory system complicating pregnancy, second trimester: Secondary | ICD-10-CM | POA: Insufficient documentation

## 2016-12-01 DIAGNOSIS — Z3A26 26 weeks gestation of pregnancy: Secondary | ICD-10-CM | POA: Diagnosis not present

## 2016-12-01 DIAGNOSIS — Z8371 Family history of colonic polyps: Secondary | ICD-10-CM | POA: Diagnosis not present

## 2016-12-01 DIAGNOSIS — O99342 Other mental disorders complicating pregnancy, second trimester: Secondary | ICD-10-CM | POA: Insufficient documentation

## 2016-12-01 DIAGNOSIS — Z800 Family history of malignant neoplasm of digestive organs: Secondary | ICD-10-CM | POA: Insufficient documentation

## 2016-12-01 DIAGNOSIS — Z888 Allergy status to other drugs, medicaments and biological substances status: Secondary | ICD-10-CM | POA: Diagnosis not present

## 2016-12-01 DIAGNOSIS — O4702 False labor before 37 completed weeks of gestation, second trimester: Secondary | ICD-10-CM | POA: Insufficient documentation

## 2016-12-01 DIAGNOSIS — O99612 Diseases of the digestive system complicating pregnancy, second trimester: Secondary | ICD-10-CM | POA: Insufficient documentation

## 2016-12-01 DIAGNOSIS — O479 False labor, unspecified: Secondary | ICD-10-CM | POA: Diagnosis not present

## 2016-12-01 DIAGNOSIS — Z818 Family history of other mental and behavioral disorders: Secondary | ICD-10-CM | POA: Diagnosis not present

## 2016-12-01 LAB — URINALYSIS, ROUTINE W REFLEX MICROSCOPIC
Bilirubin Urine: NEGATIVE
Glucose, UA: NEGATIVE mg/dL
Hgb urine dipstick: NEGATIVE
Ketones, ur: 5 mg/dL — AB
Nitrite: NEGATIVE
Protein, ur: 100 mg/dL — AB
Specific Gravity, Urine: 1.033 — ABNORMAL HIGH (ref 1.005–1.030)
pH: 5 (ref 5.0–8.0)

## 2016-12-01 NOTE — MAU Note (Signed)
Pt states she has been having contractions for 2 days, denies bleeding or LOF.

## 2016-12-01 NOTE — Telephone Encounter (Signed)
Patient called in stating that she thinks that she is having contractions, rates pain at a 4, denies bleeding. Advised pt to go to the hospital for further evaluation.

## 2016-12-01 NOTE — Discharge Instructions (Signed)
Braxton Hicks Contractions °Contractions of the uterus can occur throughout pregnancy. Contractions are not always a sign that you are in labor.  °WHAT ARE BRAXTON HICKS CONTRACTIONS?  °Contractions that occur before labor are called Braxton Hicks contractions, or false labor. Toward the end of pregnancy (32-34 weeks), these contractions can develop more often and may become more forceful. This is not true labor because these contractions do not result in opening (dilatation) and thinning of the cervix. They are sometimes difficult to tell apart from true labor because these contractions can be forceful and people have different pain tolerances. You should not feel embarrassed if you go to the hospital with false labor. Sometimes, the only way to tell if you are in true labor is for your health care provider to look for changes in the cervix. °If there are no prenatal problems or other health problems associated with the pregnancy, it is completely safe to be sent home with false labor and await the onset of true labor. °HOW CAN YOU TELL THE DIFFERENCE BETWEEN TRUE AND FALSE LABOR? °False Labor  °· The contractions of false labor are usually shorter and not as hard as those of true labor.   °· The contractions are usually irregular.   °· The contractions are often felt in the front of the lower abdomen and in the groin.   °· The contractions may go away when you walk around or change positions while lying down.   °· The contractions get weaker and are shorter lasting as time goes on.   °· The contractions do not usually become progressively stronger, regular, and closer together as with true labor.   °True Labor  °· Contractions in true labor last 30-70 seconds, become very regular, usually become more intense, and increase in frequency.   °· The contractions do not go away with walking.   °· The discomfort is usually felt in the top of the uterus and spreads to the lower abdomen and low back.   °· True labor can be  determined by your health care provider with an exam. This will show that the cervix is dilating and getting thinner.   °WHAT TO REMEMBER °· Keep up with your usual exercises and follow other instructions given by your health care provider.   °· Take medicines as directed by your health care provider.   °· Keep your regular prenatal appointments.   °· Eat and drink lightly if you think you are going into labor.   °· If Braxton Hicks contractions are making you uncomfortable:   °¨ Change your position from lying down or resting to walking, or from walking to resting.   °¨ Sit and rest in a tub of warm water.   °¨ Drink 2-3 glasses of water. Dehydration may cause these contractions.   °¨ Do slow and deep breathing several times an hour.   °WHEN SHOULD I SEEK IMMEDIATE MEDICAL CARE? °Seek immediate medical care if: °· Your contractions become stronger, more regular, and closer together.   °· You have fluid leaking or gushing from your vagina.   °· You have a fever.   °· You pass blood-tinged mucus.   °· You have vaginal bleeding.   °· You have continuous abdominal pain.   °· You have low back pain that you never had before.   °· You feel your baby's head pushing down and causing pelvic pressure.   °· Your baby is not moving as much as it used to.   °This information is not intended to replace advice given to you by your health care provider. Make sure you discuss any questions you have with your health care   provider. °Document Released: 12/01/2005 Document Revised: 03/24/2016 Document Reviewed: 09/12/2013 °Elsevier Interactive Patient Education © 2017 [employer] ° °

## 2016-12-01 NOTE — MAU Provider Note (Signed)
History     CSN: [id number]  Arrival date and time: 12/01/16 1550    First Provider Initiated Contact with Patient 12/01/16 1624      Chief Complaint  Patient presents with  . Contractions   HPI DAILA KOETTER is a 30 y.o. [id number] at [redacted]w[redacted]d who presents with contractions. Reports contractions since yesterday. Denies pain with contractions. States feels pressure & tightening 4 times every 2 hours. Lasts for several minutes at a time. Denies n/v/d, constipation, vaginal bleeding, or LOF. Positive fetal movement. Had intercourse last night.   OB History    Gravida Para Term Preterm AB Living   4 1 1   2 1    SAB TAB Ectopic Multiple Live Births         0 1      Past Medical History:  Diagnosis Date  . Anxiety   . Asthma    inhaler PRN-hasn't used inhaler in months  . Attention deficit hyperactivity disorder   . Bipolar disorder [facility])    hospitalized as a teen for suicidal ideations and cutting  . Depression   . Gastritis   . GERD (gastroesophageal reflux disease)   . HPV (human papilloma virus) anogenital infection   . Hx of varicella   . Mild preeclampsia 02/09/2016  . Vaginal Pap smear, abnormal     Past Surgical History:  Procedure Laterality Date  . EYE MUSCLE SURGERY Bilateral    07/29/12  . MOUTH SURGERY    . PLANTAR FASCIA SURGERY      Family History  Problem Relation Age of Onset  . Depression Mother   . Colon cancer Mother   . Colon polyps Mother   . Depression Father   . Depression Brother   . Liver disease    . Kidney disease      Social History  Substance Use Topics  . Smoking status: Never Smoker  . Smokeless tobacco: Never Used  . Alcohol use No    Allergies:  Allergies  Allergen Reactions  . Depakote [Divalproex Sodium] Other (See Comments)    Pt states that this medication makes her blood levels toxic.    [name] Methylphenidate Derivatives Other (See Comments)    Reaction:  Depression and anger   . Neurontin [Gabapentin] Other (See  Comments)    Reaction:  Dizziness   . Prozac [Fluoxetine Hcl] Other (See Comments)    Reaction:  Anger     Prescriptions Prior to Admission  Medication Sig Dispense Refill Last Dose  . ARIPiprazole (ABILIFY) 15 MG tablet Take 15 mg by mouth daily.    11/24/2016 at Unknown time  . Prenatal Vit-Fe Fumarate-FA (PRENATAL MULTIVITAMIN) TABS tablet Take 1 tablet by mouth at bedtime.    11/24/2016 at Unknown time    Review of Systems  Constitutional: Negative.   Gastrointestinal: Positive for heartburn. Negative for abdominal pain, constipation, diarrhea, nausea and vomiting.  Genitourinary: Negative.    Physical Exam   Blood pressure 108/64, pulse 88, temperature 98 F (36.7 C), temperature source Oral, resp. rate 18, last menstrual period 05/12/2016, unknown if currently breastfeeding.  Physical Exam  Nursing note and vitals reviewed. Constitutional: She is oriented to person, place, and time. She appears well-developed and well-nourished. No distress.  HENT:  Head: Normocephalic and atraumatic.  Eyes: Conjunctivae are normal. Right eye exhibits no discharge. Left eye exhibits no discharge. No scleral icterus.  Neck: Normal range of motion.  Respiratory: Effort normal. No respiratory distress.  Neurological: She is alert and oriented  to person, place, and time.  Skin: Skin is warm and dry. She is not diaphoretic.  Psychiatric: She has a normal mood and affect. Her behavior is normal. Judgment and thought content normal.   Dilation: Closed Effacement (%): Thick Cervical Position: Posterior Exam by:: Robyne Askew NP  Fetal Tracing:  Baseline: 135 Variability: moderate Accelerations: 10x10 Decelerations: none  Toco: none   MAU Course  Procedures Results for orders placed or performed during the hospital encounter of 12/01/16 (from the past 24 hour(s))  Urinalysis, Routine w reflex microscopic     Status: Abnormal   Collection Time: 12/01/16  4:22 PM  Result Value Ref  Range   Color, Urine AMBER (A) YELLOW   APPearance CLOUDY (A) CLEAR   Specific Gravity, Urine 1.033 (H) 1.005 - 1.030   pH 5.0 5.0 - 8.0   Glucose, UA NEGATIVE NEGATIVE mg/dL   Hgb urine dipstick NEGATIVE NEGATIVE   Bilirubin Urine NEGATIVE NEGATIVE   Ketones, ur 5 (A) NEGATIVE mg/dL   Protein, ur 100 (A) NEGATIVE mg/dL   Nitrite NEGATIVE NEGATIVE   Leukocytes, UA MODERATE (A) NEGATIVE   RBC / HPF 0-5 0 - 5 RBC/hpf   WBC, UA TOO NUMEROUS TO COUNT 0 - 5 WBC/hpf   Bacteria, UA RARE (A) NONE SEEN   Squamous Epithelial / LPF 6-30 (A) NONE SEEN   Mucous PRESENT     MDM Category 1 tracing No ctx on TOCO or palpated Unable to collect FFN d/t recent IC PO hydration  Assessment and Plan  A: 1. Braxton Hick's contraction    P: Discharge home Pt left prior to me speaking with her about d/c & prior to receiving AVS -- reportedly needed to leave imminently so she wouldn't miss the bus  Jorje Guild 12/01/2016, 4:23 PM

## 2016-12-04 LAB — CULTURE, OB URINE

## 2016-12-06 ENCOUNTER — Encounter (HOSPITAL_COMMUNITY): Payer: Self-pay | Admitting: *Deleted

## 2016-12-06 ENCOUNTER — Ambulatory Visit (HOSPITAL_COMMUNITY)
Admission: EM | Admit: 2016-12-06 | Discharge: 2016-12-06 | Disposition: A | Discharge status: Home / Self Care | Payer: Medicare Other | Attending: Emergency Medicine | Admitting: Emergency Medicine

## 2016-12-06 DIAGNOSIS — Z3A24 24 weeks gestation of pregnancy: Secondary | ICD-10-CM | POA: Diagnosis not present

## 2016-12-06 DIAGNOSIS — Z3201 Encounter for pregnancy test, result positive: Secondary | ICD-10-CM | POA: Diagnosis not present

## 2016-12-06 LAB — POCT PREGNANCY, URINE: Preg Test, Ur: POSITIVE — AB

## 2016-12-06 NOTE — ED Notes (Signed)
Patient called, no answer.
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
## 2016-12-06 NOTE — ED Triage Notes (Signed)
Reports not having a normal period; requesting preg test.

## 2016-12-06 NOTE — ED Notes (Signed)
Pt was in spouses room when called from the lobby.

## 2016-12-06 NOTE — ED Notes (Signed)
Pt called and failed from the lobby

## 2016-12-06 NOTE — Discharge Instructions (Signed)
Congratulations on your pregnancy. Please continue to see your OB/GYN for regular prenatal care.

## 2016-12-06 NOTE — ED Notes (Signed)
Upon reviewing pt's LMP, noted that chart states pt's LMP was 04/2016 with EDD of 02/2017.  Upon questioning pt, pt denies miscarriage or abortion; states "Well, I don't know what that's all about".

## 2016-12-07 ENCOUNTER — Encounter (HOSPITAL_COMMUNITY): Payer: Self-pay | Admitting: *Deleted

## 2016-12-07 ENCOUNTER — Inpatient Hospital Stay (HOSPITAL_COMMUNITY)
Admission: AD | Admit: 2016-12-07 | Discharge: 2016-12-07 | Disposition: A | Discharge status: Home / Self Care | Payer: Medicare Other | Source: Ambulatory Visit | Attending: Obstetrics and Gynecology | Admitting: Obstetrics and Gynecology

## 2016-12-07 DIAGNOSIS — R109 Unspecified abdominal pain: Secondary | ICD-10-CM | POA: Insufficient documentation

## 2016-12-07 DIAGNOSIS — O479 False labor, unspecified: Secondary | ICD-10-CM

## 2016-12-07 DIAGNOSIS — Z3A28 28 weeks gestation of pregnancy: Secondary | ICD-10-CM | POA: Insufficient documentation

## 2016-12-07 DIAGNOSIS — O26892 Other specified pregnancy related conditions, second trimester: Secondary | ICD-10-CM | POA: Diagnosis present

## 2016-12-07 DIAGNOSIS — O99342 Other mental disorders complicating pregnancy, second trimester: Secondary | ICD-10-CM | POA: Diagnosis not present

## 2016-12-07 DIAGNOSIS — F419 Anxiety disorder, unspecified: Secondary | ICD-10-CM | POA: Diagnosis not present

## 2016-12-07 DIAGNOSIS — Z3480 Encounter for supervision of other normal pregnancy, unspecified trimester: Secondary | ICD-10-CM

## 2016-12-07 DIAGNOSIS — R531 Weakness: Secondary | ICD-10-CM | POA: Diagnosis not present

## 2016-12-07 DIAGNOSIS — O4702 False labor before 37 completed weeks of gestation, second trimester: Secondary | ICD-10-CM | POA: Insufficient documentation

## 2016-12-07 DIAGNOSIS — O99612 Diseases of the digestive system complicating pregnancy, second trimester: Secondary | ICD-10-CM | POA: Diagnosis not present

## 2016-12-07 DIAGNOSIS — O99512 Diseases of the respiratory system complicating pregnancy, second trimester: Secondary | ICD-10-CM | POA: Insufficient documentation

## 2016-12-07 DIAGNOSIS — K219 Gastro-esophageal reflux disease without esophagitis: Secondary | ICD-10-CM | POA: Insufficient documentation

## 2016-12-07 DIAGNOSIS — R1084 Generalized abdominal pain: Secondary | ICD-10-CM | POA: Diagnosis not present

## 2016-12-07 LAB — URINALYSIS, ROUTINE W REFLEX MICROSCOPIC
Bilirubin Urine: NEGATIVE
Glucose, UA: NEGATIVE mg/dL
Hgb urine dipstick: NEGATIVE
Ketones, ur: NEGATIVE mg/dL
Leukocytes, UA: NEGATIVE
Nitrite: NEGATIVE
Protein, ur: NEGATIVE mg/dL
Specific Gravity, Urine: 1.015 (ref 1.005–1.030)
pH: 7 (ref 5.0–8.0)

## 2016-12-07 NOTE — MAU Provider Note (Signed)
History   Patient [name] is a 30 year old G4P1021 at 27 weeks and 4 days who arrived by EMS with complaints of sharp pains on both sides of her abdomen and a tightening that comes and goes. She thinks that might be contractions. She is also requesting a taxi and a meal.   CSN: [id number]  Arrival date and time: 12/07/16 2012   None     No chief complaint on file.  Abdominal Pain  This is a new problem. The current episode started today. The onset quality is sudden. The problem has been unchanged. The pain is located in the LLQ and RLQ. The pain is at a severity of 5/10. The pain is mild. The quality of the pain is sharp. The abdominal pain does not radiate. Pertinent negatives include no anorexia, arthralgias, belching, constipation, diarrhea, dysuria, fever, flatus, frequency, headaches, hematochezia, hematuria, melena, myalgias, nausea, vomiting or weight loss. Nothing aggravates the pain. The pain is relieved by nothing. She has tried nothing for the symptoms.    OB History    Gravida Para Term Preterm AB Living   4 1 1   2 1    SAB TAB Ectopic Multiple Live Births         0 1      Past Medical History:  Diagnosis Date  . Anxiety   . Asthma    inhaler PRN-hasn't used inhaler in months  . Attention deficit hyperactivity disorder   . Bipolar disorder [facility])    hospitalized as a teen for suicidal ideations and cutting  . Depression   . Gastritis   . GERD (gastroesophageal reflux disease)   . HPV (human papilloma virus) anogenital infection   . Hx of varicella   . Mild preeclampsia 02/09/2016  . Vaginal Pap smear, abnormal     Past Surgical History:  Procedure Laterality Date  . EYE MUSCLE SURGERY Bilateral    07/29/12  . MOUTH SURGERY    . PLANTAR FASCIA SURGERY    . WISDOM TOOTH EXTRACTION      Family History  Problem Relation Age of Onset  . Depression Mother   . Colon cancer Mother   . Colon polyps Mother   . Depression Father   . Depression Brother    . Liver disease    . Kidney disease      Social History  Substance Use Topics  . Smoking status: Never Smoker  . Smokeless tobacco: Never Used  . Alcohol use No    Allergies:  Allergies  Allergen Reactions  . Depakote [Divalproex Sodium] Other (See Comments)    Pt states that this medication makes her blood levels toxic.    [name] Methylphenidate Derivatives Other (See Comments)    Reaction:  Depression and anger   . Neurontin [Gabapentin] Other (See Comments)    Reaction:  Dizziness   . Prozac [Fluoxetine Hcl] Other (See Comments)    Reaction:  Anger     Prescriptions Prior to Admission  Medication Sig Dispense Refill Last Dose  . ARIPiprazole (ABILIFY) 15 MG tablet Take 15 mg by mouth daily.    12/07/2016 at Unknown time  . Prenatal Vit-Fe Fumarate-FA (PRENATAL MULTIVITAMIN) TABS tablet Take 1 tablet by mouth at bedtime.    12/06/2016 at Unknown time    Review of Systems  Constitutional: Negative for fever and weight loss.  HENT: Negative.   Eyes: Negative.   Respiratory: Negative.   Cardiovascular: Negative.   Gastrointestinal: Positive for abdominal pain. Negative for anorexia,  constipation, diarrhea, flatus, hematochezia, melena, nausea and vomiting.  Genitourinary: Negative for dysuria, frequency and hematuria.  Musculoskeletal: Negative for arthralgias and myalgias.  Skin: Negative.   Neurological: Negative for headaches.   Physical Exam   Blood pressure 105/70, pulse 83, temperature 98.3 F (36.8 C), temperature source Oral, resp. rate 15, height 5\' 6"  (1.676 m), weight 90.3 kg (199 lb), not currently breastfeeding.  Physical Exam  Constitutional: She is oriented to person, place, and time. She appears well-developed and well-nourished.  HENT:  Head: Normocephalic.  Neck: Normal range of motion.  Respiratory: Effort normal. No respiratory distress. She has no wheezes. She has no rales. She exhibits no tenderness.  GI: She exhibits no distension and no mass.  There is no tenderness. There is no rebound and no guarding.  Musculoskeletal: Normal range of motion.  Neurological: She is alert and oriented to person, place, and time.  Skin: Skin is warm and dry.  Psychiatric: She has a normal mood and affect.    MAU Course  Procedures  MDM -UA:  normal -FHR: 140 bpm with present accelerations, moderate variability. One variable deceleration. No contractions.   Assessment and Plan   1. Braxton Hick's contraction   2. Supervision of other normal pregnancy, antepartum    Plan:  -Discharge home with instructions to keep prenatal appointment on 12/09/2016 - Recommended pregnancy belly band for round ligament pain and emphasized normalcy of Braxton Hicks contractions.  -Reviewed warning signs of preterm labor (contractions that increase in frequency or that do not go away with rest, bleeding, decreased fetal movements)    Mervyn Skeeters Amoreena Neubert CNM 12/07/2016, 8:54 PM

## 2016-12-07 NOTE — Discharge Instructions (Signed)
Braxton Hicks Contractions °Contractions of the uterus can occur throughout pregnancy. Contractions are not always a sign that you are in labor.  °WHAT ARE BRAXTON HICKS CONTRACTIONS?  °Contractions that occur before labor are called Braxton Hicks contractions, or false labor. Toward the end of pregnancy (32-34 weeks), these contractions can develop more often and may become more forceful. This is not true labor because these contractions do not result in opening (dilatation) and thinning of the cervix. They are sometimes difficult to tell apart from true labor because these contractions can be forceful and people have different pain tolerances. You should not feel embarrassed if you go to the hospital with false labor. Sometimes, the only way to tell if you are in true labor is for your health care provider to look for changes in the cervix. °If there are no prenatal problems or other health problems associated with the pregnancy, it is completely safe to be sent home with false labor and await the onset of true labor. °HOW CAN YOU TELL THE DIFFERENCE BETWEEN TRUE AND FALSE LABOR? °False Labor  °· The contractions of false labor are usually shorter and not as hard as those of true labor.   °· The contractions are usually irregular.   °· The contractions are often felt in the front of the lower abdomen and in the groin.   °· The contractions may go away when you walk around or change positions while lying down.   °· The contractions get weaker and are shorter lasting as time goes on.   °· The contractions do not usually become progressively stronger, regular, and closer together as with true labor.   °True Labor  °· Contractions in true labor last 30-70 seconds, become very regular, usually become more intense, and increase in frequency.   °· The contractions do not go away with walking.   °· The discomfort is usually felt in the top of the uterus and spreads to the lower abdomen and low back.   °· True labor can be  determined by your health care provider with an exam. This will show that the cervix is dilating and getting thinner.   °WHAT TO REMEMBER °· Keep up with your usual exercises and follow other instructions given by your health care provider.   °· Take medicines as directed by your health care provider.   °· Keep your regular prenatal appointments.   °· Eat and drink lightly if you think you are going into labor.   °· If Braxton Hicks contractions are making you uncomfortable:   °¨ Change your position from lying down or resting to walking, or from walking to resting.   °¨ Sit and rest in a tub of warm water.   °¨ Drink 2-3 glasses of water. Dehydration may cause these contractions.   °¨ Do slow and deep breathing several times an hour.   °WHEN SHOULD I SEEK IMMEDIATE MEDICAL CARE? °Seek immediate medical care if: °· Your contractions become stronger, more regular, and closer together.   °· You have fluid leaking or gushing from your vagina.   °· You have a fever.   °· You pass blood-tinged mucus.   °· You have vaginal bleeding.   °· You have continuous abdominal pain.   °· You have low back pain that you never had before.   °· You feel your baby's head pushing down and causing pelvic pressure.   °· Your baby is not moving as much as it used to.   °This information is not intended to replace advice given to you by your health care provider. Make sure you discuss any questions you have with your health care   provider. °Document Released: 12/01/2005 Document Revised: 03/24/2016 Document Reviewed: 09/12/2013 °Elsevier Interactive Patient Education © 2017 [employer] ° °

## 2016-12-07 NOTE — MAU Note (Signed)
Pt arrived EMS with c/o low abd pain that feels like tightness then releasing and occasionally a stabbing on lower abd sides.  Denies and vag bleeding or leaking and reports good fetal movement.

## 2016-12-09 ENCOUNTER — Encounter: Payer: Medicare Other | Admitting: Obstetrics

## 2016-12-12 ENCOUNTER — Encounter: Payer: Medicare Other | Admitting: Obstetrics

## 2016-12-15 NOTE — L&D Delivery Note (Signed)
Delivery Note At 1448 on 02/03/2017 a viable female was delivered via  (Presentation: vertex, OA  ).  APGAR: 7,9 ; weight TBD.   Placenta status: spontaneous, intact.  Cord: 3 vessel cord with the following complications: none.   Anesthesia:  Epidural Episiotomy: None Lacerations: None Suture Repair: N/A Est. Blood Loss (mL): 100  Mom to postpartum.  Baby to Couplet care / Skin to Skin.   Bing, DO, PGY-1 02/03/2017, 3:14 PM

## 2016-12-16 ENCOUNTER — Encounter (HOSPITAL_COMMUNITY): Payer: Self-pay

## 2016-12-16 ENCOUNTER — Inpatient Hospital Stay (HOSPITAL_COMMUNITY)
Admission: AD | Admit: 2016-12-16 | Discharge: 2016-12-16 | Disposition: A | Discharge status: Home / Self Care | Payer: Medicare Other | Source: Ambulatory Visit | Attending: Obstetrics & Gynecology | Admitting: Obstetrics & Gynecology

## 2016-12-16 DIAGNOSIS — Z8371 Family history of colonic polyps: Secondary | ICD-10-CM | POA: Diagnosis not present

## 2016-12-16 DIAGNOSIS — Z3A28 28 weeks gestation of pregnancy: Secondary | ICD-10-CM | POA: Insufficient documentation

## 2016-12-16 DIAGNOSIS — O99513 Diseases of the respiratory system complicating pregnancy, third trimester: Secondary | ICD-10-CM | POA: Insufficient documentation

## 2016-12-16 DIAGNOSIS — R4582 Worries: Secondary | ICD-10-CM

## 2016-12-16 DIAGNOSIS — O99613 Diseases of the digestive system complicating pregnancy, third trimester: Secondary | ICD-10-CM | POA: Diagnosis present

## 2016-12-16 DIAGNOSIS — O99343 Other mental disorders complicating pregnancy, third trimester: Secondary | ICD-10-CM | POA: Insufficient documentation

## 2016-12-16 DIAGNOSIS — H538 Other visual disturbances: Secondary | ICD-10-CM | POA: Diagnosis not present

## 2016-12-16 DIAGNOSIS — O4703 False labor before 37 completed weeks of gestation, third trimester: Secondary | ICD-10-CM

## 2016-12-16 DIAGNOSIS — Z888 Allergy status to other drugs, medicaments and biological substances status: Secondary | ICD-10-CM | POA: Insufficient documentation

## 2016-12-16 DIAGNOSIS — R4589 Other symptoms and signs involving emotional state: Secondary | ICD-10-CM

## 2016-12-16 DIAGNOSIS — R030 Elevated blood-pressure reading, without diagnosis of hypertension: Secondary | ICD-10-CM | POA: Diagnosis not present

## 2016-12-16 DIAGNOSIS — Z79899 Other long term (current) drug therapy: Secondary | ICD-10-CM | POA: Insufficient documentation

## 2016-12-16 LAB — COMPREHENSIVE METABOLIC PANEL
ALT: 12 U/L — ABNORMAL LOW (ref 14–54)
AST: 13 U/L — ABNORMAL LOW (ref 15–41)
Albumin: 3 g/dL — ABNORMAL LOW (ref 3.5–5.0)
Alkaline Phosphatase: 64 U/L (ref 38–126)
Anion gap: 8 (ref 5–15)
BUN: 7 mg/dL (ref 6–20)
CO2: 23 mmol/L (ref 22–32)
Calcium: 8.9 mg/dL (ref 8.9–10.3)
Chloride: 103 mmol/L (ref 101–111)
Creatinine, Ser: 0.6 mg/dL (ref 0.44–1.00)
GFR calc Af Amer: >60 mL/min (ref >60)
GFR calc non Af Amer: >60 mL/min (ref >60)
Glucose, Bld: 132 mg/dL — ABNORMAL HIGH (ref 65–99)
Potassium: 3.8 mmol/L (ref 3.5–5.1)
Sodium: 134 mmol/L — ABNORMAL LOW (ref 135–145)
Total Bilirubin: 0.6 mg/dL (ref 0.3–1.2)
Total Protein: 6.5 g/dL (ref 6.5–8.1)

## 2016-12-16 LAB — URINALYSIS, DIPSTICK ONLY
Bilirubin Urine: NEGATIVE
Glucose, UA: NEGATIVE mg/dL
Hgb urine dipstick: NEGATIVE
Ketones, ur: 5 mg/dL — AB
Nitrite: NEGATIVE
Protein, ur: 100 mg/dL — AB
Specific Gravity, Urine: 1.035 — ABNORMAL HIGH (ref 1.005–1.030)
pH: 5 (ref 5.0–8.0)

## 2016-12-16 LAB — CBC
HCT: 32.3 % — ABNORMAL LOW (ref 36.0–46.0)
Hemoglobin: 10.7 g/dL — ABNORMAL LOW (ref 12.0–15.0)
MCH: 29.4 pg (ref 26.0–34.0)
MCHC: 33.1 g/dL (ref 30.0–36.0)
MCV: 88.7 fL (ref 78.0–100.0)
Platelets: 158 10*3/uL (ref 150–400)
RBC: 3.64 MIL/uL — ABNORMAL LOW (ref 3.87–5.11)
RDW: 15.2 % (ref 11.5–15.5)
WBC: 7.4 10*3/uL (ref 4.0–10.5)

## 2016-12-16 LAB — PROTEIN / CREATININE RATIO, URINE
Creatinine, Urine: 554 mg/dL
Protein Creatinine Ratio: 0.09 mg/mg{Cre} (ref 0.00–0.15)
Total Protein, Urine: 50 mg/dL

## 2016-12-16 NOTE — Progress Notes (Addendum)
G4P1 @ 28+[redacted] wksga. Presents to triage for blurred vision and ctx. Denies LOF or bleeding. +FM noted. See flow sheet for VS and assessment.  Hx: Pt states had pre-E for last pregnancy induced @ [redacted] wksga. SVD. Bipolar and on meds. Dx at 31 years old.   1920: drink provided. BP 108/75.   1915: urine results back. Ketone of 5 noted. 2 cups of water given with pt instructions to drink both cups of water. Pt verbalizes understanding. Provider made aware of ketones and protein in urine

## 2016-12-16 NOTE — Discharge Instructions (Signed)
Hypertension During Pregnancy Hypertension is also called high blood pressure. High blood pressure means that the force of your blood moving in your body is too strong. When you are pregnant, this condition should be watched carefully. It can cause problems for you and your baby. Follow these instructions at home: Eating and drinking  Drink enough fluid to keep your pee (urine) clear or pale yellow.  Eat healthy foods that are low in salt (sodium). ? Do not add salt to your food. ? Check labels on foods and drinks to see much salt is in them. Look on the label where you see "Sodium." Lifestyle  Do not use any products that contain nicotine or tobacco, such as cigarettes and e-cigarettes. If you need help quitting, ask your doctor.  Do not use alcohol.  Avoid caffeine.  Avoid stress. Rest and get plenty of sleep. General instructions  Take over-the-counter and prescription medicines only as told by your doctor.  While lying down, lie on your left side. This keeps pressure off your baby.  While sitting or lying down, raise (elevate) your feet. Try putting some pillows under your lower legs.  Exercise regularly. Ask your doctor what kinds of exercise are best for you.  Keep all prenatal and follow-up visits as told by your doctor. This is important. Contact a doctor if:  You have symptoms that your doctor told you to watch for, such as: ? Fever. ? Throwing up (vomiting). ? Headache. Get help right away if:  You have very bad pain in your belly (abdomen).  You are throwing up, and this does not get better with treatment.  You suddenly get swelling in your hands, ankles, or face.  You gain 4 lb (1.8 kg) or more in 1 week.  You get bleeding from your vagina.  You have blood in your pee.  You do not feel your baby moving as much as normal.  You have a change in vision.  You have muscle twitching or sudden tightening (spasms).  You have trouble breathing.  Your lips  or fingernails turn blue. This information is not intended to replace advice given to you by your health care provider. Make sure you discuss any questions you have with your health care provider. Document Released: 01/03/2011 Document Revised: 08/12/2016 Document Reviewed: 08/12/2016 Elsevier Interactive Patient Education  2017 [employer]vier Inc.  

## 2016-12-16 NOTE — MAU Provider Note (Signed)
History     CSN: [id number]  Arrival date and time: 12/16/16 1748   First Provider Initiated Contact with Patient 12/16/16 1957      Chief Complaint  Patient presents with  . Contractions    started yesterday  . Blurred Vision    started at 61    HPI   [name] is a 31 y.o. female [phone] @ 57w6dhere once again in MAU. The patient has 13 MAU visits in this pregnancy alone. The patient has multiple questions regarding CPS taking this baby. She lost custody of her daughter due to unstable housing. She presents tonight with complaints of occasional contraction, none since her arrival. She just wanted to make sure everything is ok with the baby. She had one episode of changes in her vision today. None currently.   She has missed multiple prenatal appointments due to transportation. Her last visit was in October.   OB History    Gravida Para Term Preterm AB Living   4 1 1   2 1    SAB TAB Ectopic Multiple Live Births         0 1      Past Medical History:  Diagnosis Date  . Anxiety   . Asthma    inhaler PRN-hasn't used inhaler in months  . Attention deficit hyperactivity disorder   . Bipolar disorder ([facility]    hospitalized as a teen for suicidal ideations and cutting  . Depression   . Gastritis   . GERD (gastroesophageal reflux disease)   . HPV (human papilloma virus) anogenital infection   . Hx of varicella   . Mild preeclampsia 02/09/2016  . Vaginal Pap smear, abnormal     Past Surgical History:  Procedure Laterality Date  . EYE MUSCLE SURGERY Bilateral    07/29/12  . MOUTH SURGERY    . PLANTAR FASCIA SURGERY    . WISDOM TOOTH EXTRACTION      Family History  Problem Relation Age of Onset  . Depression Mother   . Colon cancer Mother   . Colon polyps Mother   . Depression Father   . Depression Brother   . Liver disease    . Kidney disease      Social History  Substance Use Topics  . Smoking status: Never Smoker  . Smokeless tobacco: Never  Used  . Alcohol use No    Allergies:  Allergies  Allergen Reactions  . Depakote [Divalproex Sodium] Other (See Comments)    Pt states that this medication makes her blood levels toxic.    .Marland KitchenMethylphenidate Derivatives Other (See Comments)    Reaction:  Depression and anger   . Neurontin [Gabapentin] Other (See Comments)    Reaction:  Dizziness   . Prozac [Fluoxetine Hcl] Other (See Comments)    Reaction:  Anger     Prescriptions Prior to Admission  Medication Sig Dispense Refill Last Dose  . ARIPiprazole (ABILIFY) 15 MG tablet Take 15 mg by mouth daily.    12/15/2016 at Unknown time  . Prenatal Vit-Fe Fumarate-FA (PRENATAL MULTIVITAMIN) TABS tablet Take 1 tablet by mouth at bedtime.    12/15/2016 at Unknown time   Results for orders placed or performed during the hospital encounter of 12/16/16 (from the past 48 hour(s))  Urinalysis, dipstick only     Status: Abnormal   Collection Time: 12/16/16  6:20 PM  Result Value Ref Range   Color, Urine AMBER (A) YELLOW    Comment: BIOCHEMICALS MAY BE AFFECTED BY  COLOR   APPearance CLOUDY (A) CLEAR   Specific Gravity, Urine 1.035 (H) 1.005 - 1.030   pH 5.0 5.0 - 8.0   Glucose, UA NEGATIVE NEGATIVE mg/dL   Hgb urine dipstick NEGATIVE NEGATIVE   Bilirubin Urine NEGATIVE NEGATIVE   Ketones, ur 5 (A) NEGATIVE mg/dL   Protein, ur 100 (A) NEGATIVE mg/dL   Nitrite NEGATIVE NEGATIVE   Leukocytes, UA TRACE (A) NEGATIVE  Protein / creatinine ratio, urine     Status: None   Collection Time: 12/16/16  6:20 PM  Result Value Ref Range   Creatinine, Urine 554.00 mg/dL    Comment: RESULTS CONFIRMED BY MANUAL DILUTION   Total Protein, Urine 50 mg/dL    Comment: NO NORMAL RANGE ESTABLISHED FOR THIS TEST   Protein Creatinine Ratio 0.09 0.00 - 0.15 mg/mg[Cre]  CBC     Status: Abnormal   Collection Time: 12/16/16  7:01 PM  Result Value Ref Range   WBC 7.4 4.0 - 10.5 K/uL   RBC 3.64 (L) 3.87 - 5.11 MIL/uL   Hemoglobin 10.7 (L) 12.0 - 15.0 g/dL   HCT  32.3 (L) 36.0 - 46.0 %   MCV 88.7 78.0 - 100.0 fL   MCH 29.4 26.0 - 34.0 pg   MCHC 33.1 30.0 - 36.0 g/dL   RDW 15.2 11.5 - 15.5 %   Platelets 158 150 - 400 K/uL  Comprehensive metabolic panel     Status: Abnormal   Collection Time: 12/16/16  7:01 PM  Result Value Ref Range   Sodium 134 (L) 135 - 145 mmol/L   Potassium 3.8 3.5 - 5.1 mmol/L   Chloride 103 101 - 111 mmol/L   CO2 23 22 - 32 mmol/L   Glucose, Bld 132 (H) 65 - 99 mg/dL   BUN 7 6 - 20 mg/dL   Creatinine, Ser 0.60 0.44 - 1.00 mg/dL   Calcium 8.9 8.9 - 10.3 mg/dL   Total Protein 6.5 6.5 - 8.1 g/dL   Albumin 3.0 (L) 3.5 - 5.0 g/dL   AST 13 (L) 15 - 41 U/L   ALT 12 (L) 14 - 54 U/L   Alkaline Phosphatase 64 38 - 126 U/L   Total Bilirubin 0.6 0.3 - 1.2 mg/dL   GFR calc non Af Amer >60 >60 mL/min   GFR calc Af Amer >60 >60 mL/min    Comment: (NOTE) The eGFR has been calculated using the CKD EPI equation. This calculation has not been validated in all clinical situations. eGFR's persistently <60 mL/min signify possible Chronic Kidney Disease.    Anion gap 8 5 - 15    Review of Systems  Eyes: Negative for blurred vision.  Gastrointestinal: Negative for abdominal pain.  Neurological: Negative for headaches. Loss of consciousness:    Physical Exam   Blood pressure 108/75, pulse 109, temperature 98.3 F (36.8 C), temperature source Oral, resp. rate 18, not currently breastfeeding.   Patient Vitals for the past 24 hrs:  BP Temp Temp src Pulse Resp  12/16/16 1920 108/75 - - 109 -  12/16/16 1843 122/77 - - 116 -  12/16/16 1839 120/86 - - (!) 131 -  12/16/16 1834 (!) 129/111 98.3 F (36.8 C) Oral 109 18    Physical Exam  Constitutional: She is oriented to person, place, and time. She appears well-developed and well-nourished. No distress.  Musculoskeletal: Normal range of motion.  Neurological: She is alert and oriented to person, place, and time. She has normal reflexes. She displays normal reflexes.  Skin: Skin  is  warm. She is not diaphoretic.  Psychiatric: Her mood appears anxious. She is slowed.  Strong odor noted from room.   Fetal Tracing: Baseline: 130 bpm Variability: Moderate  Accelerations: 15x15 Decelerations: None Toco: None  MAU Course  Procedures  None  MDM One elevated BP reading 129/111, normal following.  PIH labs PCR  Assessment and Plan   A:  1. Feeling worried   2. Elevated BP without diagnosis of hypertension     P:  Discharge home in stable condition  Return to MAU for emergencies only Discussed the importance of going to prenatal visits.  Preeclampsia precautions    Lezlie Lye, NP 12/16/2016 8:39 PM

## 2016-12-23 ENCOUNTER — Ambulatory Visit (INDEPENDENT_AMBULATORY_CARE_PROVIDER_SITE_OTHER): Payer: Medicare Other | Admitting: Certified Nurse Midwife

## 2016-12-23 VITALS — BP 120/82 | HR 99 | Temp 97.4°F | Wt 207.8 lb

## 2016-12-23 DIAGNOSIS — F900 Attention-deficit hyperactivity disorder, predominantly inattentive type: Secondary | ICD-10-CM

## 2016-12-23 DIAGNOSIS — O0930 Supervision of pregnancy with insufficient antenatal care, unspecified trimester: Secondary | ICD-10-CM

## 2016-12-23 DIAGNOSIS — E669 Obesity, unspecified: Secondary | ICD-10-CM

## 2016-12-23 DIAGNOSIS — O0933 Supervision of pregnancy with insufficient antenatal care, third trimester: Secondary | ICD-10-CM

## 2016-12-23 DIAGNOSIS — F79 Unspecified intellectual disabilities: Secondary | ICD-10-CM

## 2016-12-23 DIAGNOSIS — F411 Generalized anxiety disorder: Secondary | ICD-10-CM

## 2016-12-23 DIAGNOSIS — F3162 Bipolar disorder, current episode mixed, moderate: Secondary | ICD-10-CM

## 2016-12-23 DIAGNOSIS — O99343 Other mental disorders complicating pregnancy, third trimester: Secondary | ICD-10-CM

## 2016-12-23 DIAGNOSIS — F3132 Bipolar disorder, current episode depressed, moderate: Secondary | ICD-10-CM

## 2016-12-23 DIAGNOSIS — O99213 Obesity complicating pregnancy, third trimester: Secondary | ICD-10-CM

## 2016-12-23 DIAGNOSIS — Z3480 Encounter for supervision of other normal pregnancy, unspecified trimester: Secondary | ICD-10-CM
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
## 2016-12-25 ENCOUNTER — Other Ambulatory Visit: Payer: Medicare Other

## 2016-12-26 ENCOUNTER — Encounter (HOSPITAL_COMMUNITY): Payer: Self-pay | Admitting: *Deleted

## 2016-12-26 ENCOUNTER — Inpatient Hospital Stay (HOSPITAL_COMMUNITY)
Admission: AD | Admit: 2016-12-26 | Discharge: 2016-12-26 | Disposition: A | Discharge status: Home / Self Care | Payer: Medicare Other | Source: Ambulatory Visit | Attending: Obstetrics and Gynecology | Admitting: Obstetrics and Gynecology

## 2016-12-26 DIAGNOSIS — Z3A30 30 weeks gestation of pregnancy: Secondary | ICD-10-CM | POA: Diagnosis not present

## 2016-12-26 DIAGNOSIS — O99613 Diseases of the digestive system complicating pregnancy, third trimester: Secondary | ICD-10-CM | POA: Diagnosis not present

## 2016-12-26 DIAGNOSIS — J45909 Unspecified asthma, uncomplicated: Secondary | ICD-10-CM | POA: Insufficient documentation

## 2016-12-26 DIAGNOSIS — Z800 Family history of malignant neoplasm of digestive organs: Secondary | ICD-10-CM | POA: Diagnosis not present

## 2016-12-26 DIAGNOSIS — O99513 Diseases of the respiratory system complicating pregnancy, third trimester: Secondary | ICD-10-CM | POA: Diagnosis not present

## 2016-12-26 DIAGNOSIS — F319 Bipolar disorder, unspecified: Secondary | ICD-10-CM | POA: Insufficient documentation

## 2016-12-26 DIAGNOSIS — Z8371 Family history of colonic polyps: Secondary | ICD-10-CM | POA: Diagnosis not present

## 2016-12-26 DIAGNOSIS — Z3480 Encounter for supervision of other normal pregnancy, unspecified trimester: Secondary | ICD-10-CM

## 2016-12-26 DIAGNOSIS — Z888 Allergy status to other drugs, medicaments and biological substances status: Secondary | ICD-10-CM | POA: Diagnosis not present

## 2016-12-26 DIAGNOSIS — Z841 Family history of disorders of kidney and ureter: Secondary | ICD-10-CM | POA: Insufficient documentation

## 2016-12-26 DIAGNOSIS — Z818 Family history of other mental and behavioral disorders: Secondary | ICD-10-CM | POA: Diagnosis not present

## 2016-12-26 DIAGNOSIS — O99343 Other mental disorders complicating pregnancy, third trimester: Secondary | ICD-10-CM | POA: Diagnosis not present

## 2016-12-26 DIAGNOSIS — Z79899 Other long term (current) drug therapy: Secondary | ICD-10-CM | POA: Insufficient documentation

## 2016-12-26 DIAGNOSIS — K219 Gastro-esophageal reflux disease without esophagitis: Secondary | ICD-10-CM | POA: Insufficient documentation

## 2016-12-26 DIAGNOSIS — O479 False labor, unspecified: Secondary | ICD-10-CM | POA: Diagnosis not present

## 2016-12-26 DIAGNOSIS — O26893 Other specified pregnancy related conditions, third trimester: Secondary | ICD-10-CM | POA: Diagnosis present

## 2016-12-26 LAB — URINALYSIS, ROUTINE W REFLEX MICROSCOPIC
Bilirubin Urine: NEGATIVE
Glucose, UA: NEGATIVE mg/dL
Hgb urine dipstick: NEGATIVE
Ketones, ur: NEGATIVE mg/dL
Nitrite: NEGATIVE
Protein, ur: NEGATIVE mg/dL
Specific Gravity, Urine: 1.021 (ref 1.005–1.030)
pH: 5 (ref 5.0–8.0)

## 2016-12-26 LAB — WET PREP, GENITAL
Clue Cells Wet Prep HPF POC: NONE SEEN
Sperm: NONE SEEN
Trich, Wet Prep: NONE SEEN
Yeast Wet Prep HPF POC: NONE SEEN

## 2016-12-26 NOTE — MAU Provider Note (Signed)
History    [name] is a 31 year old G4P1021 at 30 weeks and 2 days here with complaints of contractions that started 2 hours ago. This is her 15th visit to the MAU in this pregnancy. She denies leaking of fluid, and bleeding. She says that the baby moves "a lot".  She has had some sticky white discharge; wants to know if she has a vaginal infection. Patient has missed multiple appointments at her ob-gyn but states that she did go to her appointment on the 11th.  CSN: [id number]  Arrival date and time: 12/26/16 1530   None     Chief Complaint  Patient presents with  . Contractions   Abdominal Pain  This is a new problem. The current episode started today. The onset quality is sudden. The problem occurs intermittently. The problem has been unchanged. The pain is at a severity of 7/10. The pain is moderate. The quality of the pain is cramping. The abdominal pain does not radiate. Pertinent negatives include no anorexia, arthralgias, belching, constipation, diarrhea, dysuria, fever, flatus, frequency, headaches, hematochezia, hematuria, melena, myalgias, nausea, vomiting or weight loss. Nothing aggravates the pain. The pain is relieved by nothing. She has tried nothing for the symptoms.    OB History    Gravida Para Term Preterm AB Living   4 1 1   2 1    SAB TAB Ectopic Multiple Live Births         1 1      Past Medical History:  Diagnosis Date  . Anxiety   . Asthma    inhaler PRN-hasn't used inhaler in months  . Attention deficit hyperactivity disorder   . Bipolar disorder [facility])    hospitalized as a teen for suicidal ideations and cutting  . Depression   . Gastritis   . GERD (gastroesophageal reflux disease)   . HPV (human papilloma virus) anogenital infection   . Hx of varicella   . Mild preeclampsia 02/09/2016  . Vaginal Pap smear, abnormal     Past Surgical History:  Procedure Laterality Date  . EYE MUSCLE SURGERY Bilateral    07/29/12  . MOUTH SURGERY    .  PLANTAR FASCIA SURGERY    . WISDOM TOOTH EXTRACTION      Family History  Problem Relation Age of Onset  . Depression Mother   . Colon cancer Mother   . Colon polyps Mother   . Depression Father   . Depression Brother   . Liver disease    . Kidney disease      Social History  Substance Use Topics  . Smoking status: Never Smoker  . Smokeless tobacco: Never Used  . Alcohol use No    Allergies:  Allergies  Allergen Reactions  . Depakote [Divalproex Sodium] Other (See Comments)    Pt states that this medication makes her blood levels toxic.    [name] Methylphenidate Derivatives Other (See Comments)    Reaction:  Depression and anger   . Neurontin [Gabapentin] Other (See Comments)    Reaction:  Dizziness   . Prozac [Fluoxetine Hcl] Other (See Comments)    Reaction:  Anger     Prescriptions Prior to Admission  Medication Sig Dispense Refill Last Dose  . ARIPiprazole (ABILIFY) 15 MG tablet Take 15 mg by mouth daily.    12/25/2016 at Unknown time  . Prenatal Vit-Fe Fumarate-FA (PRENATAL MULTIVITAMIN) TABS tablet Take 1 tablet by mouth at bedtime.    12/25/2016 at Unknown time    Review  of Systems  Constitutional: Negative for fever and weight loss.  HENT: Negative.   Eyes: Negative.   Respiratory: Negative.   Cardiovascular: Negative.   Gastrointestinal: Positive for abdominal pain. Negative for anorexia, constipation, diarrhea, flatus, hematochezia, melena, nausea and vomiting.  Endocrine: Negative.   Genitourinary: Negative for dysuria, frequency and hematuria.  Musculoskeletal: Negative for arthralgias and myalgias.  Allergic/Immunologic: Negative.   Neurological: Negative for headaches.   Physical Exam   Blood pressure 120/77, pulse 73, temperature 98.2 F (36.8 C), not currently breastfeeding.  Physical Exam  Constitutional: She appears well-developed.  HENT:  Head: Normocephalic.  Eyes: Pupils are equal, round, and reactive to light.  Neck: Normal range of  motion.  Respiratory: Effort normal. No respiratory distress. She has no wheezes. She has no rales.  GI: Soft. She exhibits no distension and no mass. There is no tenderness. There is no rebound and no guarding.  Genitourinary: Vagina normal.  Genitourinary Comments: NEFG. No lesions or discharge noted. Pelvic exam shows 1 cm/thick/posterior. No CMT.     MAU Course  Procedures  MDM -wet prep -FHR is 120; moderate variability with positive accelerations and negative decelerations.   Assessment and Plan   1. Braxton Hick's contraction   2. Supervision of other normal pregnancy, antepartum    2. Patient had to leave in order to not miss the bus; she left without her discharge papers but she verbalized understanding  to return to the MAU if contractions resume or she has any bleeding or decreased fetal movements.   [name] [name] CNM 12/26/2016, 4:50 PM

## 2016-12-26 NOTE — MAU Note (Signed)
Feels like she is having contractions, started 1-2 hrs ago. No bleeding or leaking

## 2016-12-29 ENCOUNTER — Inpatient Hospital Stay (HOSPITAL_COMMUNITY)
Admission: AD | Admit: 2016-12-29 | Discharge: 2016-12-29 | Disposition: A | Discharge status: Home / Self Care | Payer: Medicare Other | Source: Ambulatory Visit | Attending: Obstetrics & Gynecology | Admitting: Obstetrics & Gynecology

## 2016-12-29 DIAGNOSIS — O99343 Other mental disorders complicating pregnancy, third trimester: Secondary | ICD-10-CM | POA: Insufficient documentation

## 2016-12-29 DIAGNOSIS — Z888 Allergy status to other drugs, medicaments and biological substances status: Secondary | ICD-10-CM | POA: Diagnosis not present

## 2016-12-29 DIAGNOSIS — Z8371 Family history of colonic polyps: Secondary | ICD-10-CM | POA: Insufficient documentation

## 2016-12-29 DIAGNOSIS — O479 False labor, unspecified: Secondary | ICD-10-CM

## 2016-12-29 DIAGNOSIS — O4703 False labor before 37 completed weeks of gestation, third trimester: Secondary | ICD-10-CM | POA: Diagnosis present

## 2016-12-29 DIAGNOSIS — Z3A30 30 weeks gestation of pregnancy: Secondary | ICD-10-CM | POA: Diagnosis not present

## 2016-12-29 DIAGNOSIS — O99513 Diseases of the respiratory system complicating pregnancy, third trimester: Secondary | ICD-10-CM | POA: Insufficient documentation

## 2016-12-29 DIAGNOSIS — O99613 Diseases of the digestive system complicating pregnancy, third trimester: Secondary | ICD-10-CM | POA: Insufficient documentation

## 2016-12-29 DIAGNOSIS — J029 Acute pharyngitis, unspecified: Secondary | ICD-10-CM | POA: Insufficient documentation

## 2016-12-29 DIAGNOSIS — F419 Anxiety disorder, unspecified: Secondary | ICD-10-CM | POA: Diagnosis not present

## 2016-12-29 DIAGNOSIS — Z841 Family history of disorders of kidney and ureter: Secondary | ICD-10-CM | POA: Diagnosis not present

## 2016-12-29 DIAGNOSIS — Z818 Family history of other mental and behavioral disorders: Secondary | ICD-10-CM | POA: Diagnosis not present

## 2016-12-29 DIAGNOSIS — Z79899 Other long term (current) drug therapy: Secondary | ICD-10-CM | POA: Insufficient documentation

## 2016-12-29 DIAGNOSIS — J45909 Unspecified asthma, uncomplicated: Secondary | ICD-10-CM | POA: Insufficient documentation

## 2016-12-29 DIAGNOSIS — K219 Gastro-esophageal reflux disease without esophagitis: Secondary | ICD-10-CM | POA: Diagnosis not present

## 2016-12-29 DIAGNOSIS — Z800 Family history of malignant neoplasm of digestive organs: Secondary | ICD-10-CM | POA: Diagnosis not present

## 2016-12-29 LAB — RAPID STREP SCREEN (MED CTR MEBANE ONLY): Streptococcus, Group A Screen (Direct): NEGATIVE

## 2016-12-29 NOTE — MAU Note (Signed)
Pt asked me to call social work to get a bus pass for her and her husband. I called SW and advised them that the patient is asking for passes for both of them. SW will come and talk to the patient.

## 2016-12-29 NOTE — Discharge Instructions (Signed)
Braxton Hicks Contractions °Contractions of the uterus can occur throughout pregnancy. Contractions are not always a sign that you are in labor.  °WHAT ARE BRAXTON HICKS CONTRACTIONS?  °Contractions that occur before labor are called Braxton Hicks contractions, or false labor. Toward the end of pregnancy (32-34 weeks), these contractions can develop more often and may become more forceful. This is not true labor because these contractions do not result in opening (dilatation) and thinning of the cervix. They are sometimes difficult to tell apart from true labor because these contractions can be forceful and people have different pain tolerances. You should not feel embarrassed if you go to the hospital with false labor. Sometimes, the only way to tell if you are in true labor is for your health care provider to look for changes in the cervix. °If there are no prenatal problems or other health problems associated with the pregnancy, it is completely safe to be sent home with false labor and await the onset of true labor. °HOW CAN YOU TELL THE DIFFERENCE BETWEEN TRUE AND FALSE LABOR? °False Labor  °· The contractions of false labor are usually shorter and not as hard as those of true labor.   °· The contractions are usually irregular.   °· The contractions are often felt in the front of the lower abdomen and in the groin.   °· The contractions may go away when you walk around or change positions while lying down.   °· The contractions get weaker and are shorter lasting as time goes on.   °· The contractions do not usually become progressively stronger, regular, and closer together as with true labor.   °True Labor  °· Contractions in true labor last 30-70 seconds, become very regular, usually become more intense, and increase in frequency.   °· The contractions do not go away with walking.   °· The discomfort is usually felt in the top of the uterus and spreads to the lower abdomen and low back.   °· True labor can be  determined by your health care provider with an exam. This will show that the cervix is dilating and getting thinner.   °WHAT TO REMEMBER °· Keep up with your usual exercises and follow other instructions given by your health care provider.   °· Take medicines as directed by your health care provider.   °· Keep your regular prenatal appointments.   °· Eat and drink lightly if you think you are going into labor.   °· If Braxton Hicks contractions are making you uncomfortable:   °¨ Change your position from lying down or resting to walking, or from walking to resting.   °¨ Sit and rest in a tub of warm water.   °¨ Drink 2-3 glasses of water. Dehydration may cause these contractions.   °¨ Do slow and deep breathing several times an hour.   °WHEN SHOULD I SEEK IMMEDIATE MEDICAL CARE? °Seek immediate medical care if: °· Your contractions become stronger, more regular, and closer together.   °· You have fluid leaking or gushing from your vagina.   °· You have a fever.   °· You pass blood-tinged mucus.   °· You have vaginal bleeding.   °· You have continuous abdominal pain.   °· You have low back pain that you never had before.   °· You feel your baby's head pushing down and causing pelvic pressure.   °· Your baby is not moving as much as it used to.   °This information is not intended to replace advice given to you by your health care provider. Make sure you discuss any questions you have with your health care   provider. Document Released: 12/01/2005 Document Revised: 03/24/2016 Document Reviewed: 09/12/2013 Elsevier Interactive Patient Education  2017 [place] of Pregnancy The third trimester is from week 29 through week 42, months 7 through 9. This trimester is when your unborn baby (fetus) is growing very fast. At the end of the ninth month, the unborn baby is about 20 inches in length. It weighs about 6-10 pounds. Follow these instructions at home:  Avoid all smoking, herbs, and alcohol.  Avoid drugs not approved by your doctor.  Do not use any tobacco products, including cigarettes, chewing tobacco, and electronic cigarettes. If you need help quitting, ask your doctor. You may get counseling or other support to help you quit.  Only take medicine as told by your doctor. Some medicines are safe and some are not during pregnancy.  Exercise only as told by your doctor. Stop exercising if you start having cramps.  Eat regular, healthy meals.  Wear a good support bra if your breasts are tender.  Do not use hot tubs, steam rooms, or saunas.  Wear your seat belt when driving.  Avoid raw meat, uncooked cheese, and liter boxes and soil used by cats.  Take your prenatal vitamins.  Take 1500-2000 milligrams of calcium daily starting at the 20th week of pregnancy until you deliver your baby.  Try taking medicine that helps you poop (stool softener) as needed, and if your doctor approves. Eat more fiber by eating fresh fruit, vegetables, and whole grains. Drink enough fluids to keep your pee (urine) clear or pale yellow.  Take warm water baths (sitz baths) to soothe pain or discomfort caused by hemorrhoids. Use hemorrhoid cream if your doctor approves.  If you have puffy, bulging veins (varicose veins), wear support hose. Raise (elevate) your feet for 15 minutes, 3-4 times a day. Limit salt in your diet.  Avoid heavy lifting, wear low heels, and sit up straight.  Rest with your legs raised if you have leg cramps or low back pain.  Visit your dentist if you have not gone during your pregnancy. Use a soft toothbrush to brush your teeth. Be gentle when you floss.  You can have sex (intercourse) unless your doctor tells you not to.  Do not travel far distances unless you must. Only do so with your doctor's approval.  Take prenatal classes.  Practice driving to the hospital.  Pack your hospital bag.  Prepare the baby's room.  Go to your doctor visits. Get help if:  You  are not sure if you are in labor or if your water has broken.  You are dizzy.  You have mild cramps or pressure in your lower belly (abdominal).  You have a nagging pain in your belly area.  You continue to feel sick to your stomach (nauseous), throw up (vomit), or have watery poop (diarrhea).  You have bad smelling fluid coming from your vagina.  You have pain with peeing (urination). Get help right away if:  You have a fever.  You are leaking fluid from your vagina.  You are spotting or bleeding from your vagina.  You have severe belly cramping or pain.  You lose or gain weight rapidly.  You have trouble catching your breath and have chest pain.  You notice sudden or extreme puffiness (swelling) of your face, hands, ankles, feet, or legs.  You have not felt the baby move in over an hour.  You have severe headaches that do not go away with medicine.  You  have vision changes. This information is not intended to replace advice given to you by your health care provider. Make sure you discuss any questions you have with your health care provider. Document Released: 02/25/2010 Document Revised: 05/08/2016 Document Reviewed: 02/01/2013 Elsevier Interactive Patient Education  2017 [employer].
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
## 2016-12-29 NOTE — MAU Note (Signed)
Pt states she had two contractions and called EMS. States she had one since she been here. Also states she has a sore throat since yesterday.

## 2017-01-01 ENCOUNTER — Emergency Department (HOSPITAL_COMMUNITY)
Admission: EM | Admit: 2017-01-01 | Discharge: 2017-01-01 | Discharge status: Against Medical Advice | Payer: Medicare Other | Attending: Emergency Medicine | Admitting: Emergency Medicine

## 2017-01-01 DIAGNOSIS — Y9389 Activity, other specified: Secondary | ICD-10-CM | POA: Diagnosis not present

## 2017-01-01 DIAGNOSIS — F909 Attention-deficit hyperactivity disorder, unspecified type: Secondary | ICD-10-CM | POA: Insufficient documentation

## 2017-01-01 DIAGNOSIS — Z3A31 31 weeks gestation of pregnancy: Secondary | ICD-10-CM | POA: Diagnosis not present

## 2017-01-01 DIAGNOSIS — Y92410 Unspecified street and highway as the place of occurrence of the external cause: Secondary | ICD-10-CM | POA: Insufficient documentation

## 2017-01-01 DIAGNOSIS — O26893 Other specified pregnancy related conditions, third trimester: Secondary | ICD-10-CM | POA: Diagnosis not present

## 2017-01-01 DIAGNOSIS — Y999 Unspecified external cause status: Secondary | ICD-10-CM | POA: Insufficient documentation

## 2017-01-01 DIAGNOSIS — N898 Other specified noninflammatory disorders of vagina: Secondary | ICD-10-CM | POA: Diagnosis not present

## 2017-01-01 DIAGNOSIS — Z79899 Other long term (current) drug therapy: Secondary | ICD-10-CM | POA: Diagnosis not present

## 2017-01-01 DIAGNOSIS — O9A213 Injury, poisoning and certain other consequences of external causes complicating pregnancy, third trimester: Secondary | ICD-10-CM | POA: Insufficient documentation

## 2017-01-01 DIAGNOSIS — M542 Cervicalgia: Secondary | ICD-10-CM | POA: Diagnosis not present

## 2017-01-01 DIAGNOSIS — S161XXA Strain of muscle, fascia and tendon at neck level, initial encounter: Secondary | ICD-10-CM | POA: Diagnosis not present

## 2017-01-01 DIAGNOSIS — J45909 Unspecified asthma, uncomplicated: Secondary | ICD-10-CM | POA: Insufficient documentation

## 2017-01-01 DIAGNOSIS — T148XXA Other injury of unspecified body region, initial encounter: Secondary | ICD-10-CM | POA: Diagnosis not present

## 2017-01-01 LAB — CULTURE, GROUP A STREP (THRC)

## 2017-01-01 NOTE — ED Notes (Signed)
Pt out to desk wanting to leave prior to completed evaluation ER MD made aware pt appears in no distress.  Discussed with pt the risks of leaving pt aware still wants to leave. AMA papers generated
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
## 2017-01-01 NOTE — ED Notes (Signed)
ER MD with pt speaking about wanting to leave pt. Remains in the room
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
## 2017-01-01 NOTE — Progress Notes (Signed)
Patient requesting to leave AMA stating "she is uncomfortable and doesn't want to stay" Patient made aware of risks to her and baby leaving AMA; patient still insisting on leaving at this time; EFM removed

## 2017-01-01 NOTE — ED Notes (Signed)
Spoke with the rapid OB nurse, She will be over to assess the pt.
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
## 2017-01-02 ENCOUNTER — Inpatient Hospital Stay (HOSPITAL_COMMUNITY)
Admission: AD | Admit: 2017-01-02 | Discharge: 2017-01-02 | Disposition: A | Discharge status: Home / Self Care | Payer: Medicare Other | Source: Ambulatory Visit | Attending: Obstetrics and Gynecology | Admitting: Obstetrics and Gynecology

## 2017-01-02 ENCOUNTER — Encounter (HOSPITAL_COMMUNITY): Payer: Self-pay | Admitting: *Deleted

## 2017-01-02 ENCOUNTER — Other Ambulatory Visit: Payer: Medicare Other

## 2017-01-02 DIAGNOSIS — K219 Gastro-esophageal reflux disease without esophagitis: Secondary | ICD-10-CM | POA: Insufficient documentation

## 2017-01-02 DIAGNOSIS — Z8371 Family history of colonic polyps: Secondary | ICD-10-CM | POA: Diagnosis not present

## 2017-01-02 DIAGNOSIS — O99613 Diseases of the digestive system complicating pregnancy, third trimester: Secondary | ICD-10-CM | POA: Insufficient documentation

## 2017-01-02 DIAGNOSIS — N898 Other specified noninflammatory disorders of vagina: Secondary | ICD-10-CM | POA: Diagnosis not present

## 2017-01-02 DIAGNOSIS — J45909 Unspecified asthma, uncomplicated: Secondary | ICD-10-CM | POA: Insufficient documentation

## 2017-01-02 DIAGNOSIS — O99343 Other mental disorders complicating pregnancy, third trimester: Secondary | ICD-10-CM | POA: Insufficient documentation

## 2017-01-02 DIAGNOSIS — O26893 Other specified pregnancy related conditions, third trimester: Secondary | ICD-10-CM

## 2017-01-02 DIAGNOSIS — O99513 Diseases of the respiratory system complicating pregnancy, third trimester: Secondary | ICD-10-CM | POA: Insufficient documentation

## 2017-01-02 DIAGNOSIS — Z79899 Other long term (current) drug therapy: Secondary | ICD-10-CM | POA: Insufficient documentation

## 2017-01-02 DIAGNOSIS — F319 Bipolar disorder, unspecified: Secondary | ICD-10-CM | POA: Diagnosis not present

## 2017-01-02 DIAGNOSIS — Z818 Family history of other mental and behavioral disorders: Secondary | ICD-10-CM | POA: Insufficient documentation

## 2017-01-02 DIAGNOSIS — Z3A31 31 weeks gestation of pregnancy: Secondary | ICD-10-CM | POA: Diagnosis not present

## 2017-01-02 DIAGNOSIS — Z888 Allergy status to other drugs, medicaments and biological substances status: Secondary | ICD-10-CM | POA: Diagnosis not present

## 2017-01-02 DIAGNOSIS — F419 Anxiety disorder, unspecified: Secondary | ICD-10-CM | POA: Insufficient documentation

## 2017-01-02 DIAGNOSIS — R109 Unspecified abdominal pain: Secondary | ICD-10-CM | POA: Insufficient documentation

## 2017-01-02 LAB — POCT FERN TEST: POCT Fern Test: NEGATIVE

## 2017-01-02 NOTE — Discharge Instructions (Signed)
. °Preterm Labor and Birth Information °The normal length of a pregnancy is 39-41 weeks. Preterm labor is when labor starts before 37 completed weeks of pregnancy. °What are the risk factors for preterm labor? °Preterm labor is more likely to occur in women who: °· Have certain infections during pregnancy such as a bladder infection, sexually transmitted infection, or infection inside the uterus (chorioamnionitis). °· Have a shorter-than-normal cervix. °· Have gone into preterm labor before. °· Have had surgery on their cervix. °· Are younger than age 17 or older than age 35. °· Are African American. °· Are pregnant with twins or multiple babies (multiple gestation). °· Take street drugs or smoke while pregnant. °· Do not gain enough weight while pregnant. °· Became pregnant shortly after having been pregnant. °What are the symptoms of preterm labor? °Symptoms of preterm labor include: °· Cramps similar to those that can happen during a menstrual period. The cramps may happen with diarrhea. °· Pain in the abdomen or lower back. °· Regular uterine contractions that may feel like tightening of the abdomen. °· A feeling of increased pressure in the pelvis. °· Increased watery or bloody mucus discharge from the vagina. °· Water breaking (ruptured amniotic sac). °Why is it important to recognize signs of preterm labor? °It is important to recognize signs of preterm labor because babies who are born prematurely may not be fully developed. This can put them at an increased risk for: °· Long-term (chronic) heart and lung problems. °· Difficulty immediately after birth with regulating body systems, including blood sugar, body temperature, heart rate, and breathing rate. °· Bleeding in the brain. °· Cerebral palsy. °· Learning difficulties. °· Death. °These risks are highest for babies who are born before 34 weeks of pregnancy. °How is preterm labor treated? °Treatment depends on the length of your pregnancy, your condition,  and the health of your baby. It may involve: °· Having a stitch (suture) placed in your cervix to prevent your cervix from opening too early (cerclage). °· Taking or being given medicines, such as: °¨ Hormone medicines. These may be given early in pregnancy to help support the pregnancy. °¨ Medicine to stop contractions. °¨ Medicines to help mature the baby’s lungs. These may be prescribed if the risk of delivery is high. °¨ Medicines to prevent your baby from developing cerebral palsy. °If the labor happens before 34 weeks of pregnancy, you may need to stay in the hospital. °What should I do if I think I am in preterm labor? °If you think that you are going into preterm labor, call your health care provider right away. °How can I prevent preterm labor in future pregnancies? °To increase your chance of having a full-term pregnancy: °· Do not use any tobacco products, such as cigarettes, chewing tobacco, and e-cigarettes. If you need help quitting, ask your health care provider. °· Do not use street drugs or medicines that have not been prescribed to you during your pregnancy. °· Talk with your health care provider before taking any herbal supplements, even if you have been taking them regularly. °· Make sure you gain a healthy amount of weight during your pregnancy. °· Watch for infection. If you think that you might have an infection, get it checked right away. °· Make sure to tell your health care provider if you have gone into preterm labor before. °This information is not intended to replace advice given to you by your health care provider. Make sure you discuss any questions you have with your   health care provider. °Document Released: 02/21/2004 Document Revised: 05/13/2016 Document Reviewed: 04/23/2016 °Elsevier Interactive Patient Education © 2017 Elsevier Inc. ° °

## 2017-01-02 NOTE — MAU Note (Addendum)
Thinks her water broke, had one gush of fluid about an hour ago.  None since. No bleeding. Little pain.  ( no ctx's noted the 30 min with EMS, no fluid noted on stretcher when arrived)

## 2017-01-02 NOTE — MAU Provider Note (Signed)
History   [id number]   Chief Complaint  Patient presents with  . possible ruptured membranes  . Abdominal Cramping    HPI [name] is a 31 y.o. female  [id number] here via EMS with report of watery vaginal discharge that began at approximately 30 minutes PTA. Reports gush of thin clear fluid, no odor. Leaking of fluid has not continued. Denies abdominal pain, vaginal bleeding, dysuria, or recent intercourse.    No LMP recorded (lmp unknown). Patient is pregnant.  OB History  Gravida Para Term Preterm AB Living  4 1 1   2 1   SAB TAB Ectopic Multiple Live Births        1 1    # Outcome Date GA Lbr Len/2nd Weight Sex Delivery Anes PTL Lv  4A Gravida           4B Current           3 Term 02/11/16 [redacted]w[redacted]d / 01:14 8 lb 2.6 oz (3.702 kg) F Vag-Spont EPI  LIV  2 AB           1 AB               Past Medical History:  Diagnosis Date  . Anxiety   . Asthma    inhaler PRN-hasn't used inhaler in months  . Attention deficit hyperactivity disorder   . Bipolar disorder [facility])    hospitalized as a teen for suicidal ideations and cutting  . Depression   . Gastritis   . GERD (gastroesophageal reflux disease)   . HPV (human papilloma virus) anogenital infection   . Hx of varicella   . Mild preeclampsia 02/09/2016  . Vaginal Pap smear, abnormal     Family History  Problem Relation Age of Onset  . Depression Mother   . Colon cancer Mother   . Colon polyps Mother   . Depression Father   . Depression Brother   . Liver disease    . Kidney disease      Social History   Social History  . Marital status: Married    Spouse name: N/A  . Number of children: 0  . Years of education: N/A   Social History Main Topics  . Smoking status: Never Smoker  . Smokeless tobacco: Never Used  . Alcohol use No  . Drug use: No  . Sexual activity: Yes    Birth control/ protection: None     Comment: last sex was Dec 22   Other Topics Concern  . None   Social History Narrative  . None     Allergies  Allergen Reactions  . Depakote [Divalproex Sodium] Other (See Comments)    Pt states that this medication makes her blood levels toxic.    [name] Methylphenidate Derivatives Other (See Comments)    Reaction:  Depression and anger   . Neurontin [Gabapentin] Other (See Comments)    Reaction:  Dizziness   . Prozac [Fluoxetine Hcl] Other (See Comments)    Reaction:  Anger     No current facility-administered medications on file prior to encounter.    Current Outpatient Prescriptions on File Prior to Encounter  Medication Sig Dispense Refill  . ARIPiprazole (ABILIFY) 15 MG tablet Take 15 mg by mouth at bedtime.     . Prenatal Vit-Fe Fumarate-FA (PRENATAL MULTIVITAMIN) TABS tablet Take 1 tablet by mouth at bedtime.        Review of Systems  Constitutional: Negative.   Gastrointestinal: Negative.   Genitourinary: Positive for  vaginal discharge. Negative for vaginal bleeding.     Physical Exam   Vitals:   01/02/17 1616 01/02/17 1704  BP: 124/89 114/73  Pulse: 99 87  Resp: 16   Temp: 99 F (37.2 C)   Weight: 201 lb 8 oz (91.4 kg)     Physical Exam  Nursing note and vitals reviewed. Constitutional: She is oriented to person, place, and time. She appears well-developed and well-nourished. No distress.  HENT:  Head: Normocephalic and atraumatic.  Eyes: Conjunctivae are normal. Right eye exhibits no discharge. Left eye exhibits no discharge. No scleral icterus.  Neck: Normal range of motion.  Respiratory: Effort normal. No respiratory distress.  GI: Soft. There is no tenderness.  Genitourinary: Vaginal discharge (small amount of thick white discharge; no pooling) found.  Neurological: She is alert and oriented to person, place, and time.  Skin: Skin is warm and dry. She is not diaphoretic.  Psychiatric: She has a normal mood and affect. Her behavior is normal. Judgment and thought content normal.   Fetal Tracing:  Baseline: 125 Variability:  moderate Accelerations: 15x15 Decelerations: none  Toco: none   MAU Course  Procedures Results for orders placed or performed during the hospital encounter of 01/02/17 (from the past 24 hour(s))  Fern Test     Status: None   Collection Time: 01/02/17  5:59 PM  Result Value Ref Range   POCT Fern Test Negative = intact amniotic membranes     MDM Reactive fetal tracing No pooling Fern negative  Assessment and Plan  A: 1. Vaginal discharge during pregnancy in third trimester    P: Discharge home Preterm labor precautions Keep f/u with OB   Jorje Guild, NP 01/02/2017 5:36 PM

## 2017-01-05 ENCOUNTER — Inpatient Hospital Stay (HOSPITAL_COMMUNITY)
Admission: AD | Admit: 2017-01-05 | Discharge: 2017-01-06 | Disposition: A | Discharge status: Home / Self Care | Payer: Medicare Other | Source: Ambulatory Visit | Attending: Family Medicine | Admitting: Family Medicine

## 2017-01-05 ENCOUNTER — Encounter (HOSPITAL_COMMUNITY): Payer: Self-pay | Admitting: *Deleted

## 2017-01-05 DIAGNOSIS — Z841 Family history of disorders of kidney and ureter: Secondary | ICD-10-CM | POA: Diagnosis not present

## 2017-01-05 DIAGNOSIS — O99343 Other mental disorders complicating pregnancy, third trimester: Secondary | ICD-10-CM | POA: Insufficient documentation

## 2017-01-05 DIAGNOSIS — K219 Gastro-esophageal reflux disease without esophagitis: Secondary | ICD-10-CM | POA: Insufficient documentation

## 2017-01-05 DIAGNOSIS — O26899 Other specified pregnancy related conditions, unspecified trimester: Secondary | ICD-10-CM | POA: Diagnosis not present

## 2017-01-05 DIAGNOSIS — O99513 Diseases of the respiratory system complicating pregnancy, third trimester: Secondary | ICD-10-CM | POA: Diagnosis not present

## 2017-01-05 DIAGNOSIS — Z800 Family history of malignant neoplasm of digestive organs: Secondary | ICD-10-CM | POA: Diagnosis not present

## 2017-01-05 DIAGNOSIS — F319 Bipolar disorder, unspecified: Secondary | ICD-10-CM | POA: Insufficient documentation

## 2017-01-05 DIAGNOSIS — Z9889 Other specified postprocedural states: Secondary | ICD-10-CM | POA: Diagnosis not present

## 2017-01-05 DIAGNOSIS — R109 Unspecified abdominal pain: Secondary | ICD-10-CM | POA: Diagnosis not present

## 2017-01-05 DIAGNOSIS — O26893 Other specified pregnancy related conditions, third trimester: Secondary | ICD-10-CM | POA: Insufficient documentation

## 2017-01-05 DIAGNOSIS — Z3480 Encounter for supervision of other normal pregnancy, unspecified trimester: Secondary | ICD-10-CM

## 2017-01-05 DIAGNOSIS — Z79899 Other long term (current) drug therapy: Secondary | ICD-10-CM | POA: Diagnosis not present

## 2017-01-05 DIAGNOSIS — Z888 Allergy status to other drugs, medicaments and biological substances status: Secondary | ICD-10-CM | POA: Diagnosis not present

## 2017-01-05 DIAGNOSIS — O99613 Diseases of the digestive system complicating pregnancy, third trimester: Secondary | ICD-10-CM | POA: Insufficient documentation

## 2017-01-05 DIAGNOSIS — Z3A31 31 weeks gestation of pregnancy: Secondary | ICD-10-CM | POA: Diagnosis present

## 2017-01-05 DIAGNOSIS — F419 Anxiety disorder, unspecified: Secondary | ICD-10-CM | POA: Insufficient documentation

## 2017-01-05 DIAGNOSIS — Z818 Family history of other mental and behavioral disorders: Secondary | ICD-10-CM | POA: Diagnosis not present

## 2017-01-05 LAB — URINALYSIS, ROUTINE W REFLEX MICROSCOPIC
Bilirubin Urine: NEGATIVE
Glucose, UA: NEGATIVE mg/dL
Hgb urine dipstick: NEGATIVE
Ketones, ur: NEGATIVE mg/dL
Nitrite: NEGATIVE
Protein, ur: 30 mg/dL — AB
Specific Gravity, Urine: 1.024 (ref 1.005–1.030)
pH: 5 (ref 5.0–8.0)

## 2017-01-05 NOTE — MAU Note (Signed)
PT SAYS  UC STARTED  10PM   . PNC - WITH   FAMINA.    LAST SEX-    1  WEEK AGO.

## 2017-01-05 NOTE — MAU Provider Note (Signed)
Chief Complaint:  Contractions   First Provider Initiated Contact with Patient 01/05/17 2345      HPI: [name] is a 31 y.o. [id number] at [redacted]w[redacted]d who presents to maternity admissions reporting contractions that started around 2000. Denies any HA, changes in vision, NV or diarrhea.  Denies CP, SOB or LE edema. Denies any leakage of fluid or vaginal bleeding. Good fetal movement. Also notes some right sided pelvic pressure that is non-reproducible on exam.   Pregnancy Course:   Past Medical History: Past Medical History:  Diagnosis Date  . Anxiety   . Asthma    inhaler PRN-hasn't used inhaler in months  . Attention deficit hyperactivity disorder   . Bipolar disorder [facility])    hospitalized as a teen for suicidal ideations and cutting  . Depression   . Gastritis   . GERD (gastroesophageal reflux disease)   . HPV (human papilloma virus) anogenital infection   . Hx of varicella   . Mild preeclampsia 02/09/2016  . Vaginal Pap smear, abnormal     Past obstetric history: OB History  Gravida Para Term Preterm AB Living  4 1 1   2 1   SAB TAB Ectopic Multiple Live Births        1 1    # Outcome Date GA Lbr Len/2nd Weight Sex Delivery Anes PTL Lv  4A Gravida           4B Current           3 Term 02/11/16 [redacted]w[redacted]d / 01:14 3.702 kg (8 lb 2.6 oz) F Vag-Spont EPI  LIV  2 AB           1 AB               Past Surgical History: Past Surgical History:  Procedure Laterality Date  . EYE MUSCLE SURGERY Bilateral    07/29/12  . MOUTH SURGERY    . PLANTAR FASCIA SURGERY    . WISDOM TOOTH EXTRACTION       Family History: Family History  Problem Relation Age of Onset  . Depression Mother   . Colon cancer Mother   . Colon polyps Mother   . Depression Father   . Depression Brother   . Liver disease    . Kidney disease      Social History: Social History  Substance Use Topics  . Smoking status: Never Smoker  . Smokeless tobacco: Never Used  . Alcohol use No    Allergies:   Allergies  Allergen Reactions  . Depakote [Divalproex Sodium] Other (See Comments)    Pt states that this medication makes her blood levels toxic.    [name] Methylphenidate Derivatives Other (See Comments)    Reaction:  Depression and anger   . Neurontin [Gabapentin] Other (See Comments)    Reaction:  Dizziness   . Prozac [Fluoxetine Hcl] Other (See Comments)    Reaction:  Anger     Meds:  Prescriptions Prior to Admission  Medication Sig Dispense Refill Last Dose  . ARIPiprazole (ABILIFY) 15 MG tablet Take 15 mg by mouth at bedtime.    01/04/2017 at Unknown time  . Prenatal Vit-Fe Fumarate-FA (PRENATAL MULTIVITAMIN) TABS tablet Take 1 tablet by mouth at bedtime.    01/04/2017 at Unknown time    I have reviewed patient's Past Medical Hx, Surgical Hx, Family Hx, Social Hx, medications and allergies.   ROS:  A comprehensive ROS was negative except per HPI.    Physical Exam   Patient  Vitals for the past 24 hrs:  BP Temp Temp src Pulse Resp Weight  01/05/17 2300 112/64 98.4 F (36.9 C) Oral 82 18 92.6 kg (204 lb 4 oz)   Constitutional: Well-developed, well-nourished female in no acute distress.  Cardiovascular: RRR, no MRG Respiratory: NOWB, CTABL, no wheezing or rhonchi GI: Abd soft, non-tender, gravid appropriate for gestational age.  MS: Extremities nontender, no edema, normal ROM Neurologic: AAOX3 GU: Neg CVAT.  Pelvic: NEFG, physiologic discharge, no blood, cervix clean and grossly dilated.     FHT:  Baseline HR 125 , moderate variability, accelerations present, no decelerations Contractions: none  Cervix dilatd to 1.5-2cm and thick.    Labs: Results for orders placed or performed during the hospital encounter of 01/05/17 (from the past 24 hour(s))  Urinalysis, Routine w reflex microscopic     Status: Abnormal   Collection Time: 01/05/17 11:02 PM  Result Value Ref Range   Color, Urine YELLOW YELLOW   APPearance HAZY (A) CLEAR   Specific Gravity, Urine 1.024 1.005 -  1.030   pH 5.0 5.0 - 8.0   Glucose, UA NEGATIVE NEGATIVE mg/dL   Hgb urine dipstick NEGATIVE NEGATIVE   Bilirubin Urine NEGATIVE NEGATIVE   Ketones, ur NEGATIVE NEGATIVE mg/dL   Protein, ur 30 (A) NEGATIVE mg/dL   Nitrite NEGATIVE NEGATIVE   Leukocytes, UA TRACE (A) NEGATIVE   RBC / HPF 0-5 0 - 5 RBC/hpf   WBC, UA 6-30 0 - 5 WBC/hpf   Bacteria, UA FEW (A) NONE SEEN   Squamous Epithelial / LPF 6-30 (A) NONE SEEN   Mucous PRESENT   Wet prep, genital     Status: Abnormal   Collection Time: 01/06/17 12:05 AM  Result Value Ref Range   Yeast Wet Prep HPF POC NONE SEEN NONE SEEN   Trich, Wet Prep NONE SEEN NONE SEEN   Clue Cells Wet Prep HPF POC NONE SEEN NONE SEEN   WBC, Wet Prep HPF POC MANY (A) NONE SEEN   Sperm NONE SEEN   Fetal fibronectin     Status: None   Collection Time: 01/06/17 12:05 AM  Result Value Ref Range   Fetal Fibronectin NEGATIVE NEGATIVE    Imaging:  No results found.  MAU Course: FHT:  FHR baseline 130s, mod variability, pos accels, no decels, no contactions Fetal fibronectin: collected and sent away Speculum exam:  NEFG, physiologic discharge with grossly dilated cervix Wet prep: negative Pelvic exam:  dilated cervix to 1.5-2cm.  UA: contaminated sample, no nitrites   MDM: Plan of care reviewed with patient, including labs and tests ordered and medical treatment.   Assessment: 1. Abdominal pain affecting pregnancy   2. Supervision of other normal pregnancy, antepartum   Patient reporting contractions that started around 2000 and had cervix dilated to 1.5-2cm and had no contractions on toco.  Low suspicion for pre-term labor, but will send away FFN.  FHT are appropriate.   Plan: Discharge home in stable condition.  Preterm labor precautions and fetal kick counts    Allergies as of 01/06/2017      Reactions   Depakote [divalproex Sodium] Other (See Comments)   Pt states that this medication makes her blood levels toxic.     Methylphenidate  Derivatives Other (See Comments)   Reaction:  Depression and anger    Neurontin [gabapentin] Other (See Comments)   Reaction:  Dizziness    Prozac [fluoxetine Hcl] Other (See Comments)   Reaction:  Anger       Medication List  TAKE these medications   ARIPiprazole 15 MG tablet Commonly known as:  ABILIFY Take 15 mg by mouth at bedtime.   prenatal multivitamin Tabs tablet Take 1 tablet by mouth at bedtime.       Eloise Levels, MD PGY-1 01/06/2017 1:10 AM   OB FELLOW DISCHARGE ATTESTATION  I have seen and examined this patient and agree with above documentation in the resident's note.   Jacquiline Doe, MD 2:22 AM

## 2017-01-06 ENCOUNTER — Ambulatory Visit (INDEPENDENT_AMBULATORY_CARE_PROVIDER_SITE_OTHER): Payer: Medicare Other | Admitting: Certified Nurse Midwife

## 2017-01-06 VITALS — BP 119/88 | HR 118 | Wt 202.6 lb

## 2017-01-06 DIAGNOSIS — R69 Illness, unspecified: Secondary | ICD-10-CM | POA: Diagnosis not present

## 2017-01-06 DIAGNOSIS — R109 Unspecified abdominal pain: Secondary | ICD-10-CM

## 2017-01-06 DIAGNOSIS — J4520 Mild intermittent asthma, uncomplicated: Secondary | ICD-10-CM

## 2017-01-06 DIAGNOSIS — F79 Unspecified intellectual disabilities: Secondary | ICD-10-CM

## 2017-01-06 DIAGNOSIS — O26893 Other specified pregnancy related conditions, third trimester: Secondary | ICD-10-CM | POA: Diagnosis not present

## 2017-01-06 DIAGNOSIS — O98313 Other infections with a predominantly sexual mode of transmission complicating pregnancy, third trimester: Secondary | ICD-10-CM

## 2017-01-06 DIAGNOSIS — O99513 Diseases of the respiratory system complicating pregnancy, third trimester: Secondary | ICD-10-CM

## 2017-01-06 DIAGNOSIS — O0933 Supervision of pregnancy with insufficient antenatal care, third trimester: Secondary | ICD-10-CM

## 2017-01-06 DIAGNOSIS — O26899 Other specified pregnancy related conditions, unspecified trimester: Secondary | ICD-10-CM

## 2017-01-06 DIAGNOSIS — F3132 Bipolar disorder, current episode depressed, moderate: Secondary | ICD-10-CM

## 2017-01-06 DIAGNOSIS — J111 Influenza due to unidentified influenza virus with other respiratory manifestations: Secondary | ICD-10-CM

## 2017-01-06 DIAGNOSIS — F333 Major depressive disorder, recurrent, severe with psychotic symptoms: Secondary | ICD-10-CM

## 2017-01-06 DIAGNOSIS — G4733 Obstructive sleep apnea (adult) (pediatric): Secondary | ICD-10-CM

## 2017-01-06 DIAGNOSIS — Z3480 Encounter for supervision of other normal pregnancy, unspecified trimester: Secondary | ICD-10-CM

## 2017-01-06 DIAGNOSIS — O0930 Supervision of pregnancy with insufficient antenatal care, unspecified trimester: Secondary | ICD-10-CM

## 2017-01-06 DIAGNOSIS — A630 Anogenital (venereal) warts: Secondary | ICD-10-CM

## 2017-01-06 LAB — FETAL FIBRONECTIN: Fetal Fibronectin: NEGATIVE

## 2017-01-06 LAB — WET PREP, GENITAL
Clue Cells Wet Prep HPF POC: NONE SEEN
Sperm: NONE SEEN
Trich, Wet Prep: NONE SEEN
Yeast Wet Prep HPF POC: NONE SEEN

## 2017-01-06 NOTE — Discharge Instructions (Signed)
Abdominal Pain During Pregnancy °Belly (abdominal) pain is common during pregnancy. Most of the time, it is not a serious problem. Other times, it can be a sign that something is wrong with the pregnancy. Always tell your doctor if you have belly pain. °Follow these instructions at home: °Monitor your belly pain for any changes. The following actions may help you feel better: °· Do not have sex (intercourse) or put anything in your vagina until you feel better. °· Rest until your pain stops. °· Drink clear fluids if you feel sick to your stomach (nauseous). Do not eat solid food until you feel better. °· Only take medicine as told by your doctor. °· Keep all doctor visits as told. °Get help right away if: °· You are bleeding, leaking fluid, or pieces of tissue come out of your vagina. °· You have more pain or cramping. °· You keep throwing up (vomiting). °· You have pain when you pee (urinate) or have blood in your pee. °· You have a fever. °· You do not feel your baby moving as much. °· You feel very weak or feel like passing out. °· You have trouble breathing, with or without belly pain. °· You have a very bad headache and belly pain. °· You have fluid leaking from your vagina and belly pain. °· You keep having watery poop (diarrhea). °· Your belly pain does not go away after resting, or the pain gets worse. °This information is not intended to replace advice given to you by your health care provider. Make sure you discuss any questions you have with your health care provider. °Document Released: 11/19/2009 Document Revised: 07/09/2016 Document Reviewed: 06/30/2013 °Elsevier Interactive Patient Education © 2017 Elsevier Inc. ° °
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
## 2017-01-07 LAB — GC/CHLAMYDIA PROBE AMP (~~LOC~~) NOT AT ARMC
Chlamydia: NEGATIVE
Neisseria Gonorrhea: NEGATIVE

## 2017-01-07 LAB — INFLUENZA A AND B
Influenza A Ag, EIA: NEGATIVE
Influenza B Ag, EIA: NEGATIVE

## 2017-01-07 LAB — PLEASE NOTE:

## 2017-01-08 ENCOUNTER — Telehealth: Payer: Self-pay | Admitting: *Deleted

## 2017-01-08 NOTE — Telephone Encounter (Signed)
Pt called to office for Flu results and discuss mucous plug.  Return call to pt. Pt made aware of negative Flu test. Pt states that she has started to loose her mucous plug, keeps having mucous come out.  Pt states that she was 2cm at hospital visit. Pt denies bleeding, cramping, ctx. Pt concerned since only 32 weeks.  Pt made aware message would be sent to provider regarding this concern. Pt made aware that if she were to have any of above symptoms to be seen at [facility].   Please advise on any recommendations at this point.

## 2017-01-10 ENCOUNTER — Encounter (HOSPITAL_COMMUNITY): Payer: Self-pay

## 2017-01-10 ENCOUNTER — Inpatient Hospital Stay (HOSPITAL_COMMUNITY)
Admission: EM | Admit: 2017-01-10 | Discharge: 2017-01-10 | Disposition: A | Discharge status: Home / Self Care | Payer: Medicare Other | Attending: Obstetrics and Gynecology | Admitting: Obstetrics and Gynecology

## 2017-01-10 DIAGNOSIS — F419 Anxiety disorder, unspecified: Secondary | ICD-10-CM | POA: Diagnosis not present

## 2017-01-10 DIAGNOSIS — Z9889 Other specified postprocedural states: Secondary | ICD-10-CM | POA: Diagnosis not present

## 2017-01-10 DIAGNOSIS — Z8371 Family history of colonic polyps: Secondary | ICD-10-CM | POA: Diagnosis not present

## 2017-01-10 DIAGNOSIS — R109 Unspecified abdominal pain: Secondary | ICD-10-CM | POA: Insufficient documentation

## 2017-01-10 DIAGNOSIS — Z800 Family history of malignant neoplasm of digestive organs: Secondary | ICD-10-CM | POA: Diagnosis not present

## 2017-01-10 DIAGNOSIS — Z818 Family history of other mental and behavioral disorders: Secondary | ICD-10-CM | POA: Insufficient documentation

## 2017-01-10 DIAGNOSIS — Z79899 Other long term (current) drug therapy: Secondary | ICD-10-CM | POA: Diagnosis not present

## 2017-01-10 DIAGNOSIS — R1030 Lower abdominal pain, unspecified: Secondary | ICD-10-CM | POA: Diagnosis not present

## 2017-01-10 DIAGNOSIS — O26893 Other specified pregnancy related conditions, third trimester: Secondary | ICD-10-CM | POA: Diagnosis not present

## 2017-01-10 DIAGNOSIS — J45909 Unspecified asthma, uncomplicated: Secondary | ICD-10-CM | POA: Diagnosis not present

## 2017-01-10 DIAGNOSIS — N859 Noninflammatory disorder of uterus, unspecified: Secondary | ICD-10-CM

## 2017-01-10 DIAGNOSIS — O99613 Diseases of the digestive system complicating pregnancy, third trimester: Secondary | ICD-10-CM | POA: Insufficient documentation

## 2017-01-10 DIAGNOSIS — O99513 Diseases of the respiratory system complicating pregnancy, third trimester: Secondary | ICD-10-CM | POA: Diagnosis not present

## 2017-01-10 DIAGNOSIS — Z888 Allergy status to other drugs, medicaments and biological substances status: Secondary | ICD-10-CM | POA: Insufficient documentation

## 2017-01-10 DIAGNOSIS — O99343 Other mental disorders complicating pregnancy, third trimester: Secondary | ICD-10-CM | POA: Diagnosis not present

## 2017-01-10 DIAGNOSIS — K219 Gastro-esophageal reflux disease without esophagitis: Secondary | ICD-10-CM | POA: Insufficient documentation

## 2017-01-10 DIAGNOSIS — Z3A32 32 weeks gestation of pregnancy: Secondary | ICD-10-CM | POA: Insufficient documentation

## 2017-01-10 DIAGNOSIS — N858 Other specified noninflammatory disorders of uterus: Secondary | ICD-10-CM

## 2017-01-10 MED ORDER — NIFEDIPINE 10 MG PO CAPS
10.0000 mg | ORAL_CAPSULE | Freq: Once | ORAL | Status: AC
  Administered 2017-01-10: 10 mg via ORAL
  Filled 2017-01-10: qty 1

## 2017-01-10 NOTE — Progress Notes (Signed)
Report given to Jewel Baize RN in MAU.

## 2017-01-10 NOTE — MAU Note (Signed)
Patient given two bus passes.

## 2017-01-10 NOTE — MAU Note (Signed)
Patient given sandwich tray. 

## 2017-01-10 NOTE — MAU Note (Signed)
States was having contractions and continues to lose more of her mucous plug

## 2017-01-10 NOTE — Progress Notes (Signed)
Spoke with Dr. Elly Modena. Pt is a G4P1 at 32 3/[redacted] weeks gestation here with c/o pain on both sides of her abd and groin and loss of her mucous plug 2 days ago. No vaginal bleeding or leaking of fluid. FHR tracing is a category 1 with no uc's. Cervix is 1-2 cm dilated, 60 % effaced and the vertex is at a -2 station. She wants pt transferred to [facility], MAU. Dr. Venora Maples notified.

## 2017-01-10 NOTE — ED Triage Notes (Signed)
Pt will not make a decision to go to [facility], finally told she has to give yes or no answer. No, then immediately states she will go, pt difficult to assess and care for due to inconsistency with answers. [name] with OB ambulating pt to bathroom, now tells staff that she is not going. Dr Venora Maples aware

## 2017-01-10 NOTE — ED Triage Notes (Signed)
Patient came in today for well prenatal check. Pt stated that she loss her mucus pug two days ago. Pt state she call her doctor and she was told to seek medical care right away but never went to m.d. Mary, RN rapid OB responses term at bedsides.

## 2017-01-10 NOTE — ED Triage Notes (Signed)
Report called to [facility] at [facility]
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
## 2017-01-10 NOTE — Progress Notes (Signed)
Pt presents with c/o losing her mucous plug and pain on both sides of her abd and both groin areas. No vaginal bleeding or leaking of fluid. Gets her care at [facility]. Pt has a hx of mental retardation and bipolar disorder. Takes Abilify. She is a G4P1 at 32 3/[redacted] weeks gestation. Previous baby was a vaginal delivery.

## 2017-01-10 NOTE — Discharge Instructions (Signed)
Third Trimester of Pregnancy The third trimester is from week 29 through week 40 (months 7 through 9). The third trimester is a time when the unborn baby (fetus) is growing rapidly. At the end of the ninth month, the fetus is about 20 inches in length and weighs 6-10 pounds. Body changes during your third trimester Your body goes through many changes during pregnancy. The changes vary from woman to woman. During the third trimester:  Your weight will continue to increase. You can expect to gain 25-35 pounds (11-16 kg) by the end of the pregnancy.  You may begin to get stretch marks on your hips, abdomen, and breasts.  You may urinate more often because the fetus is moving lower into your pelvis and pressing on your bladder.  You may develop or continue to have heartburn. This is caused by increased hormones that slow down muscles in the digestive tract.  You may develop or continue to have constipation because increased hormones slow digestion and cause the muscles that push waste through your intestines to relax.  You may develop hemorrhoids. These are swollen veins (varicose veins) in the rectum that can itch or be painful.  You may develop swollen, bulging veins (varicose veins) in your legs.  You may have increased body aches in the pelvis, back, or thighs. This is due to weight gain and increased hormones that are relaxing your joints.  You may have changes in your hair. These can include thickening of your hair, rapid growth, and changes in texture. Some women also have hair loss during or after pregnancy, or hair that feels dry or thin. Your hair will most likely return to normal after your baby is born.  Your breasts will continue to grow and they will continue to become tender. A yellow fluid (colostrum) may leak from your breasts. This is the first milk you are producing for your baby.  Your belly button may stick out.  You may notice more swelling in your hands, face, or  ankles.  You may have increased tingling or numbness in your hands, arms, and legs. The skin on your belly may also feel numb.  You may feel short of breath because of your expanding uterus.  You may have more problems sleeping. This can be caused by the size of your belly, increased need to urinate, and an increase in your body's metabolism.  You may notice the fetus "dropping," or moving lower in your abdomen.  You may have increased vaginal discharge.  Your cervix becomes thin and soft (effaced) near your due date. What to expect at prenatal visits You will have prenatal exams every 2 weeks until week 36. Then you will have weekly prenatal exams. During a routine prenatal visit:  You will be weighed to make sure you and the fetus are growing normally.  Your blood pressure will be taken.  Your abdomen will be measured to track your baby's growth.  The fetal heartbeat will be listened to.  Any test results from the previous visit will be discussed.  You may have a cervical check near your due date to see if you have effaced. At around 36 weeks, your health care provider will check your cervix. At the same time, your health care provider will also perform a test on the secretions of the vaginal tissue. This test is to determine if a type of bacteria, Group B streptococcus, is present. Your health care provider will explain this further. Your health care provider may ask you:    What your birth plan is.  How you are feeling.  If you are feeling the baby move.  If you have had any abnormal symptoms, such as leaking fluid, bleeding, severe headaches, or abdominal cramping.  If you are using any tobacco products, including cigarettes, chewing tobacco, and electronic cigarettes.  If you have any questions. Other tests or screenings that may be performed during your third trimester include:  Blood tests that check for low iron levels (anemia).  Fetal testing to check the health,  activity level, and growth of the fetus. Testing is done if you have certain medical conditions or if there are problems during the pregnancy.  Nonstress test (NST). This test checks the health of your baby to make sure there are no signs of problems, such as the baby not getting enough oxygen. During this test, a belt is placed around your belly. The baby is made to move, and its heart rate is monitored during movement. What is false labor? False labor is a condition in which you feel small, irregular tightenings of the muscles in the womb (contractions) that eventually go away. These are called Braxton Hicks contractions. Contractions may last for hours, days, or even weeks before true labor sets in. If contractions come at regular intervals, become more frequent, increase in intensity, or become painful, you should see your health care provider. What are the signs of labor?  Abdominal cramps.  Regular contractions that start at 10 minutes apart and become stronger and more frequent with time.  Contractions that start on the top of the uterus and spread down to the lower abdomen and back.  Increased pelvic pressure and dull back pain.  A watery or bloody mucus discharge that comes from the vagina.  Leaking of amniotic fluid. This is also known as your "water breaking." It could be a slow trickle or a gush. Let your doctor know if it has a color or strange odor. If you have any of these signs, call your health care provider right away, even if it is before your due date. Follow these instructions at home: Eating and drinking  Continue to eat regular, healthy meals.  Do not eat:  Raw meat or meat spreads.  Unpasteurized milk or cheese.  Unpasteurized juice.  Store-made salad.  Refrigerated smoked seafood.  Hot dogs or deli meat, unless they are piping hot.  More than 6 ounces of albacore tuna a week.  Shark, swordfish, king mackerel, or tile fish.  Store-made salads.  Raw  sprouts, such as mung bean or alfalfa sprouts.  Take prenatal vitamins as told by your health care provider.  Take 1000 mg of calcium daily as told by your health care provider.  If you develop constipation:  Take over-the-counter or prescription medicines.  Drink enough fluid to keep your urine clear or pale yellow.  Eat foods that are high in fiber, such as fresh fruits and vegetables, whole grains, and beans.  Limit foods that are high in fat and processed sugars, such as fried and sweet foods. Activity  Exercise only as directed by your health care provider. Healthy pregnant women should aim for 2 hours and 30 minutes of moderate exercise per week. If you experience any pain or discomfort while exercising, stop.  Avoid heavy lifting.  Do not exercise in extreme heat or humidity, or at high altitudes.  Wear low-heel, comfortable shoes.  Practice good posture.  Do not travel far distances unless it is absolutely necessary and only with the approval   of your health care provider.  Wear your seat belt at all times while in a car, on a bus, or on a plane.  Take frequent breaks and rest with your legs elevated if you have leg cramps or low back pain.  Do not use hot tubs, steam rooms, or saunas.  You may continue to have sex unless your health care provider tells you otherwise. Lifestyle  Do not use any products that contain nicotine or tobacco, such as cigarettes and e-cigarettes. If you need help quitting, ask your health care provider.  Do not drink alcohol.  Do not use any medicinal herbs or unprescribed drugs. These chemicals affect the formation and growth of the baby.  If you develop varicose veins:  Wear support pantyhose or compression stockings as told by your healthcare provider.  Elevate your feet for 15 minutes, 3-4 times a day.  Wear a supportive maternity bra to help with breast tenderness. General instructions  Take over-the-counter and prescription  medicines only as told by your health care provider. There are medicines that are either safe or unsafe to take during pregnancy.  Take warm sitz baths to soothe any pain or discomfort caused by hemorrhoids. Use hemorrhoid cream or witch hazel if your health care provider approves.  Avoid cat litter boxes and soil used by cats. These carry germs that can cause birth defects in the baby. If you have a cat, ask someone to clean the litter box for you.  To prepare for the arrival of your baby:  Take prenatal classes to understand, practice, and ask questions about the labor and delivery.  Make a trial run to the hospital.  Visit the hospital and tour the maternity area.  Arrange for maternity or paternity leave through employers.  Arrange for family and friends to take care of pets while you are in the hospital.  Purchase a rear-facing car seat and make sure you know how to install it in your car.  Pack your hospital bag.  Prepare the baby's nursery. Make sure to remove all pillows and stuffed animals from the baby's crib to prevent suffocation.  Visit your dentist if you have not gone during your pregnancy. Use a soft toothbrush to brush your teeth and be gentle when you floss.  Keep all prenatal follow-up visits as told by your health care provider. This is important. Contact a health care provider if:  You are unsure if you are in labor or if your water has broken.  You become dizzy.  You have mild pelvic cramps, pelvic pressure, or nagging pain in your abdominal area.  You have lower back pain.  You have persistent nausea, vomiting, or diarrhea.  You have an unusual or bad smelling vaginal discharge.  You have pain when you urinate. Get help right away if:  You have a fever.  You are leaking fluid from your vagina.  You have spotting or bleeding from your vagina.  You have severe abdominal pain or cramping.  You have rapid weight loss or weight gain.  You have  shortness of breath with chest pain.  You notice sudden or extreme swelling of your face, hands, ankles, feet, or legs.  Your baby makes fewer than 10 movements in 2 hours.  You have severe headaches that do not go away with medicine.  You have vision changes. Summary  The third trimester is from week 29 through week 40, months 7 through 9. The third trimester is a time when the unborn baby (fetus)   is growing rapidly.  During the third trimester, your discomfort may increase as you and your baby continue to gain weight. You may have abdominal, leg, and back pain, sleeping problems, and an increased need to urinate.  During the third trimester your breasts will keep growing and they will continue to become tender. A yellow fluid (colostrum) may leak from your breasts. This is the first milk you are producing for your baby.  False labor is a condition in which you feel small, irregular tightenings of the muscles in the womb (contractions) that eventually go away. These are called Braxton Hicks contractions. Contractions may last for hours, days, or even weeks before true labor sets in.  Signs of labor can include: abdominal cramps; regular contractions that start at 10 minutes apart and become stronger and more frequent with time; watery or bloody mucus discharge that comes from the vagina; increased pelvic pressure and dull back pain; and leaking of amniotic fluid. This information is not intended to replace advice given to you by your health care provider. Make sure you discuss any questions you have with your health care provider. Document Released: 11/25/2001 Document Revised: 05/08/2016 Document Reviewed: 02/01/2013 [employer] Interactive Patient Education  2017 [employer]  

## 2017-01-10 NOTE — Progress Notes (Signed)
Spoke with Dr. Elly Modena. Okay for [facility] to sign off. [facility] is backed up and they do not know how long it will be before they can pick the pt up.

## 2017-01-10 NOTE — MAU Provider Note (Signed)
Chief Complaint:  prenatal check and Contractions   First Provider Initiated Contact with Patient 01/10/17 1827     HPI: [name] is a 31 y.o. [id number] at 16w3dwho presents to maternity admissions reporting loss of mucous plug this week. Denies contractions. Very worried she is in preterm labor. Requesting to be admitted.  Was seen in other ED today and assessed for above complaints.  Cervix found to be unchanged from previous exam, but they were instructed to transfer her here for reevaluation.  She is very anxious. . She reports good fetal movement, denies LOF, vaginal bleeding, vaginal itching/burning, urinary symptoms, h/a, dizziness, n/v, diarrhea, constipation or fever/chills.  She denies headache, visual changes or RUQ abdominal pain.   Vaginal Discharge  The patient's primary symptoms include vaginal discharge. The patient's pertinent negatives include no genital itching, genital lesions, genital odor, pelvic pain or vaginal bleeding. This is a new problem. The current episode started in the past 7 days. The problem occurs rarely. The problem has been resolved. The patient is experiencing no pain. The problem affects both sides. She is pregnant. Pertinent negatives include no abdominal pain, chills, constipation, diarrhea, fever, hematuria, nausea or vomiting. The vaginal discharge was clear and mucoid. There has been no bleeding. She has not been passing clots. She has not been passing tissue. Nothing aggravates the symptoms. She has tried nothing for the symptoms.   RN Note: States was having contractions and continues to lose more of her mucous plug  Past Medical History: Past Medical History:  Diagnosis Date  . Anxiety   . Asthma    inhaler PRN-hasn't used inhaler in months  . Attention deficit hyperactivity disorder   . Bipolar disorder [facility])    hospitalized as a teen for suicidal ideations and cutting  . Depression   . Gastritis   . GERD (gastroesophageal reflux  disease)   . HPV (human papilloma virus) anogenital infection   . Hx of varicella   . Mild preeclampsia 02/09/2016  . Vaginal Pap smear, abnormal     Past obstetric history: OB History  Gravida Para Term Preterm AB Living  4 1 1   2 1   SAB TAB Ectopic Multiple Live Births        1 1    # Outcome Date GA Lbr Len/2nd Weight Sex Delivery Anes PTL Lv  4A Gravida           4B Current           3 Term 02/11/16 [redacted]w[redacted]d / 01:14 8 lb 2.6 oz (3.702 kg) F Vag-Spont EPI  LIV  2 AB           1 AB               Past Surgical History: Past Surgical History:  Procedure Laterality Date  . EYE MUSCLE SURGERY Bilateral    07/29/12  . MOUTH SURGERY    . PLANTAR FASCIA SURGERY    . WISDOM TOOTH EXTRACTION      Family History: Family History  Problem Relation Age of Onset  . Depression Mother   . Colon cancer Mother   . Colon polyps Mother   . Depression Father   . Depression Brother   . Liver disease    . Kidney disease      Social History: Social History  Substance Use Topics  . Smoking status: Never Smoker  . Smokeless tobacco: Never Used  . Alcohol use No    Allergies:  Allergies  Allergen Reactions  . Depakote [Divalproex Sodium] Other (See Comments)    Pt states that this medication makes her blood levels toxic.    [name] Methylphenidate Derivatives Other (See Comments)    Reaction:  Depression and anger   . Neurontin [Gabapentin] Other (See Comments)    Reaction:  Dizziness   . Prozac [Fluoxetine Hcl] Other (See Comments)    Reaction:  Anger     Meds:  Prescriptions Prior to Admission  Medication Sig Dispense Refill Last Dose  . ARIPiprazole (ABILIFY) 15 MG tablet Take 15 mg by mouth at bedtime.    01/09/2017 at Unknown time  . Prenatal Vit-Fe Fumarate-FA (PRENATAL MULTIVITAMIN) TABS tablet Take 1 tablet by mouth at bedtime.    01/09/2017 at Unknown time    I have reviewed patient's Past Medical Hx, Surgical Hx, Family Hx, Social Hx, medications and allergies.   ROS:   Review of Systems  Constitutional: Negative for chills and fever.  Gastrointestinal: Negative for abdominal pain, constipation, diarrhea, nausea and vomiting.  Genitourinary: Positive for vaginal discharge. Negative for hematuria and pelvic pain.   Other systems negative  Physical Exam  Patient Vitals for the past 24 hrs:  BP Temp Temp src Pulse Resp SpO2 Height Weight  01/10/17 1659 114/87 - - 89 18 100 % - -  01/10/17 1457 - - - - - 96 % 5\' 6"  (1.676 m) 206 lb (93.4 kg)  01/10/17 1456 123/88 97.7 F (36.5 C) Oral 98 18 97 % - -   Constitutional: Well-developed, well-nourished female in no acute distress.  Cardiovascular: normal rate and rhythm Respiratory: normal effort, clear to auscultation bilaterally GI: Abd soft, non-tender, gravid appropriate for gestational age.   No rebound or guarding. MS: Extremities nontender, no edema, normal ROM Neurologic: Alert and oriented x 4.  GU: Neg CVAT.  PELVIC EXAM: Cervix pink, visually closed, without lesion, scant white creamy discharge, vaginal walls and external genitalia normal Bimanual exam: Cervix firm, posterior, neg CMT, uterus nontender, Fundal Height consistent with dates, adnexa without tenderness, enlargement, or mass        Cervix is unchanged from multiple prior exams  Dilation: 1 Effacement (%): Thick Cervical Position: Middle Station: Ballotable Presentation: Vertex Exam by:: Hansel Feinstein CNM  FHT:  Baseline 140 , moderate variability, accelerations present, no decelerations Contractions: uterine irritability with no contractions lasting longer than 10 seconds   Labs: No results found for this or any previous visit (from the past 24 hour(s)). O/Positive/-- (08/21 1517)  Imaging:  No results found.  MAU Course/MDM: I have rechecked her cervix at her request, itiis less dilated than previously reported.  NST reviewed   Uterine irritability noted.   One dose of Procardia given with resolution. Treatments in MAU  included Procardia and NST.    Assessment: 1. Uterine irritability   2. Abdominal pain, unspecified abdominal location   3. [redacted] weeks gestation of pregnancy     Plan: Discharge home Preterm Labor precautions and fetal kick counts Follow up in Office for prenatal visits and recheck of status  Follow-up Information    [name], MD Follow up.   Specialty:  Internal Medicine Contact information: [place] [address] [phone]        [facility] Follow up.   Contact information: [place] [phone]          Pt stable at time of discharge. Encouraged to return here or to other Urgent  Care/ED if she develops worsening of symptoms, increase in pain, fever, or other concerning symptoms.      Hansel Feinstein CNM, MSN Certified Nurse-Midwife 01/10/2017 7:07 PM

## 2017-01-12 ENCOUNTER — Encounter (HOSPITAL_COMMUNITY): Payer: Self-pay

## 2017-01-12 ENCOUNTER — Inpatient Hospital Stay (HOSPITAL_COMMUNITY)
Admission: AD | Admit: 2017-01-12 | Discharge: 2017-01-12 | Disposition: A | Discharge status: Home / Self Care | Payer: Medicare Other | Source: Ambulatory Visit | Attending: Obstetrics & Gynecology | Admitting: Obstetrics & Gynecology

## 2017-01-12 DIAGNOSIS — Z3A33 33 weeks gestation of pregnancy: Secondary | ICD-10-CM | POA: Insufficient documentation

## 2017-01-12 DIAGNOSIS — Z9889 Other specified postprocedural states: Secondary | ICD-10-CM | POA: Diagnosis not present

## 2017-01-12 DIAGNOSIS — O99343 Other mental disorders complicating pregnancy, third trimester: Secondary | ICD-10-CM | POA: Diagnosis not present

## 2017-01-12 DIAGNOSIS — O26893 Other specified pregnancy related conditions, third trimester: Secondary | ICD-10-CM | POA: Insufficient documentation

## 2017-01-12 DIAGNOSIS — O99513 Diseases of the respiratory system complicating pregnancy, third trimester: Secondary | ICD-10-CM | POA: Diagnosis not present

## 2017-01-12 DIAGNOSIS — O99613 Diseases of the digestive system complicating pregnancy, third trimester: Secondary | ICD-10-CM | POA: Insufficient documentation

## 2017-01-12 DIAGNOSIS — F419 Anxiety disorder, unspecified: Secondary | ICD-10-CM | POA: Diagnosis not present

## 2017-01-12 DIAGNOSIS — F319 Bipolar disorder, unspecified: Secondary | ICD-10-CM | POA: Insufficient documentation

## 2017-01-12 DIAGNOSIS — Z818 Family history of other mental and behavioral disorders: Secondary | ICD-10-CM | POA: Diagnosis not present

## 2017-01-12 DIAGNOSIS — J45909 Unspecified asthma, uncomplicated: Secondary | ICD-10-CM | POA: Insufficient documentation

## 2017-01-12 DIAGNOSIS — K219 Gastro-esophageal reflux disease without esophagitis: Secondary | ICD-10-CM | POA: Insufficient documentation

## 2017-01-12 DIAGNOSIS — N898 Other specified noninflammatory disorders of vagina: Secondary | ICD-10-CM | POA: Insufficient documentation

## 2017-01-12 DIAGNOSIS — Z79899 Other long term (current) drug therapy: Secondary | ICD-10-CM | POA: Insufficient documentation

## 2017-01-12 DIAGNOSIS — Z3A08 8 weeks gestation of pregnancy: Secondary | ICD-10-CM | POA: Diagnosis not present

## 2017-01-12 DIAGNOSIS — Z800 Family history of malignant neoplasm of digestive organs: Secondary | ICD-10-CM | POA: Diagnosis not present

## 2017-01-12 DIAGNOSIS — Z888 Allergy status to other drugs, medicaments and biological substances status: Secondary | ICD-10-CM | POA: Insufficient documentation

## 2017-01-12 DIAGNOSIS — K9289 Other specified diseases of the digestive system: Secondary | ICD-10-CM | POA: Diagnosis not present

## 2017-01-12 DIAGNOSIS — T849XXA Unspecified complication of internal orthopedic prosthetic device, implant and graft, initial encounter: Secondary | ICD-10-CM | POA: Diagnosis not present

## 2017-01-12 NOTE — MAU Provider Note (Signed)
History   Patient [name] is a 31 year old G25P1021 here with complaints of losing her mucous plug and pink blood on her toilet paper when she wipes.   CSN: [id number]  Arrival date and time: 01/12/17 1100   First Provider Initiated Contact with Patient 01/12/17 1117      No chief complaint on file.  Vaginal Discharge  The patient's primary symptoms include vaginal bleeding and vaginal discharge. The patient's pertinent negatives include no genital itching, genital lesions, genital odor, genital rash, missed menses or pelvic pain. This is a recurrent problem. The current episode started in the past 7 days. The problem occurs intermittently. The problem has been unchanged. The pain is mild. She is pregnant. Associated symptoms include abdominal pain. Pertinent negatives include no anorexia, back pain, chills, constipation, diarrhea or discolored urine. The vaginal discharge was bloody. The vaginal bleeding is spotting. She has not been passing clots. She has not been passing tissue. Nothing aggravates the symptoms. She has tried nothing for the symptoms.   Patient states that she lost her mucous plug and that there was a pink streak on her toilet paper when she wiped. Patient denies bleeding, leaking of fluid or decreased fetal movements.  OB History    Gravida Para Term Preterm AB Living   4 1 1   2 1    SAB TAB Ectopic Multiple Live Births         1 1      Past Medical History:  Diagnosis Date  . Anxiety   . Asthma    inhaler PRN-hasn't used inhaler in months  . Attention deficit hyperactivity disorder   . Bipolar disorder [facility])    hospitalized as a teen for suicidal ideations and cutting  . Depression   . Gastritis   . GERD (gastroesophageal reflux disease)   . HPV (human papilloma virus) anogenital infection   . Hx of varicella   . Mild preeclampsia 02/09/2016  . Vaginal Pap smear, abnormal     Past Surgical History:  Procedure Laterality Date  . EYE MUSCLE SURGERY  Bilateral    07/29/12  . MOUTH SURGERY    . PLANTAR FASCIA SURGERY    . WISDOM TOOTH EXTRACTION      Family History  Problem Relation Age of Onset  . Depression Mother   . Colon cancer Mother   . Colon polyps Mother   . Depression Father   . Depression Brother   . Liver disease    . Kidney disease      Social History  Substance Use Topics  . Smoking status: Never Smoker  . Smokeless tobacco: Never Used  . Alcohol use No    Allergies:  Allergies  Allergen Reactions  . Depakote [Divalproex Sodium] Other (See Comments)    Pt states that this medication makes her blood levels toxic.    [name] Methylphenidate Derivatives Other (See Comments)    Reaction:  Depression and anger   . Neurontin [Gabapentin] Other (See Comments)    Reaction:  Dizziness   . Prozac [Fluoxetine Hcl] Other (See Comments)    Reaction:  Anger     Prescriptions Prior to Admission  Medication Sig Dispense Refill Last Dose  . ARIPiprazole (ABILIFY) 15 MG tablet Take 15 mg by mouth at bedtime.    01/11/2017 at Unknown time  . Prenatal Vit-Fe Fumarate-FA (PRENATAL MULTIVITAMIN) TABS tablet Take 1 tablet by mouth at bedtime.    01/11/2017 at Unknown time    Review of Systems  Constitutional: Negative for chills.  HENT: Negative.   Eyes: Negative.   Respiratory: Negative.   Gastrointestinal: Positive for abdominal pain. Negative for anorexia, constipation and diarrhea.  Endocrine: Negative.   Genitourinary: Positive for vaginal discharge. Negative for missed menses and pelvic pain.  Musculoskeletal: Negative for back pain.  Allergic/Immunologic: Negative.   Neurological: Negative.   Hematological: Negative.   Psychiatric/Behavioral: Negative.    Physical Exam   Blood pressure 94/55, pulse 106, temperature 98.1 F (36.7 C), temperature source Oral, resp. rate 18, not currently breastfeeding.  Physical Exam  Constitutional: She is oriented to person, place, and time. She appears well-developed.  Neck:  Normal range of motion.  Respiratory: Effort normal. No respiratory distress. She has no wheezes. She has no rales. She exhibits no tenderness.  GI: Soft. She exhibits no distension and no mass. There is no tenderness. There is no rebound and no guarding.  Genitourinary: Vagina normal and uterus normal.  Musculoskeletal: Normal range of motion.  Neurological: She is alert and oriented to person, place, and time.  Skin: Skin is warm and dry.   Cervix is 2 cm/50/ posterior/ballotable. No CMT.  MAU Course  Procedures  MDM -FHR is 130, present accelerations and moderate variability, no decelersations.   Assessment and Plan  1.  1. Vaginal discharge    2. Reviewed bleeding precuations, when to return to MAU (water breaks, decreased fetal movements, leaking of fluid). Patient and husband verbalized understanding.    Mervyn Skeeters Ridgely Anastacio 01/12/2017, 11:25 AM

## 2017-01-12 NOTE — MAU Note (Signed)
Urine in lab 

## 2017-01-12 NOTE — MAU Note (Signed)
Patient states that she lost her mucus plug and it had a little blood in it. Patient also states that she is having some upper abdominal pain that started today.

## 2017-01-12 NOTE — Discharge Instructions (Signed)
Braxton Hicks Contractions °Contractions of the uterus can occur throughout pregnancy. Contractions are not always a sign that you are in labor.  °WHAT ARE BRAXTON HICKS CONTRACTIONS?  °Contractions that occur before labor are called Braxton Hicks contractions, or false labor. Toward the end of pregnancy (32-34 weeks), these contractions can develop more often and may become more forceful. This is not true labor because these contractions do not result in opening (dilatation) and thinning of the cervix. They are sometimes difficult to tell apart from true labor because these contractions can be forceful and people have different pain tolerances. You should not feel embarrassed if you go to the hospital with false labor. Sometimes, the only way to tell if you are in true labor is for your health care provider to look for changes in the cervix. °If there are no prenatal problems or other health problems associated with the pregnancy, it is completely safe to be sent home with false labor and await the onset of true labor. °HOW CAN YOU TELL THE DIFFERENCE BETWEEN TRUE AND FALSE LABOR? °False Labor  °· The contractions of false labor are usually shorter and not as hard as those of true labor.   °· The contractions are usually irregular.   °· The contractions are often felt in the front of the lower abdomen and in the groin.   °· The contractions may go away when you walk around or change positions while lying down.   °· The contractions get weaker and are shorter lasting as time goes on.   °· The contractions do not usually become progressively stronger, regular, and closer together as with true labor.   °True Labor  °· Contractions in true labor last 30-70 seconds, become very regular, usually become more intense, and increase in frequency.   °· The contractions do not go away with walking.   °· The discomfort is usually felt in the top of the uterus and spreads to the lower abdomen and low back.   °· True labor can be  determined by your health care provider with an exam. This will show that the cervix is dilating and getting thinner.   °WHAT TO REMEMBER °· Keep up with your usual exercises and follow other instructions given by your health care provider.   °· Take medicines as directed by your health care provider.   °· Keep your regular prenatal appointments.   °· Eat and drink lightly if you think you are going into labor.   °· If Braxton Hicks contractions are making you uncomfortable:   °¨ Change your position from lying down or resting to walking, or from walking to resting.   °¨ Sit and rest in a tub of warm water.   °¨ Drink 2-3 glasses of water. Dehydration may cause these contractions.   °¨ Do slow and deep breathing several times an hour.   °WHEN SHOULD I SEEK IMMEDIATE MEDICAL CARE? °Seek immediate medical care if: °· Your contractions become stronger, more regular, and closer together.   °· You have fluid leaking or gushing from your vagina.   °· You have a fever.   °· You pass blood-tinged mucus.   °· You have vaginal bleeding.   °· You have continuous abdominal pain.   °· You have low back pain that you never had before.   °· You feel your baby's head pushing down and causing pelvic pressure.   °· Your baby is not moving as much as it used to.   °This information is not intended to replace advice given to you by your health care provider. Make sure you discuss any questions you have with your health care   provider. °Document Released: 12/01/2005 Document Revised: 03/24/2016 Document Reviewed: 09/12/2013 °Elsevier Interactive Patient Education © 2017 [employer] ° °

## 2017-01-13 ENCOUNTER — Encounter (HOSPITAL_COMMUNITY): Payer: Self-pay

## 2017-01-13 ENCOUNTER — Inpatient Hospital Stay (HOSPITAL_COMMUNITY)
Admission: AD | Admit: 2017-01-13 | Discharge: 2017-01-13 | Disposition: A | Discharge status: Home / Self Care | Payer: Medicare Other | Source: Ambulatory Visit | Attending: Obstetrics and Gynecology | Admitting: Obstetrics and Gynecology

## 2017-01-13 DIAGNOSIS — M545 Low back pain: Secondary | ICD-10-CM

## 2017-01-13 DIAGNOSIS — M549 Dorsalgia, unspecified: Secondary | ICD-10-CM

## 2017-01-13 DIAGNOSIS — Z3689 Encounter for other specified antenatal screening: Secondary | ICD-10-CM

## 2017-01-13 DIAGNOSIS — Z3A32 32 weeks gestation of pregnancy: Secondary | ICD-10-CM

## 2017-01-13 DIAGNOSIS — O9989 Other specified diseases and conditions complicating pregnancy, childbirth and the puerperium: Secondary | ICD-10-CM

## 2017-01-13 DIAGNOSIS — O26893 Other specified pregnancy related conditions, third trimester: Secondary | ICD-10-CM | POA: Insufficient documentation

## 2017-01-13 DIAGNOSIS — O99891 Other specified diseases and conditions complicating pregnancy: Secondary | ICD-10-CM

## 2017-01-13 LAB — URINALYSIS, ROUTINE W REFLEX MICROSCOPIC
Bilirubin Urine: NEGATIVE
Glucose, UA: NEGATIVE mg/dL
Ketones, ur: NEGATIVE mg/dL
Nitrite: NEGATIVE
Protein, ur: 100 mg/dL — AB
Specific Gravity, Urine: 1.029 (ref 1.005–1.030)
pH: 5 (ref 5.0–8.0)

## 2017-01-13 MED ORDER — COMFORT FIT MATERNITY SUPP LG MISC: 1.0000 "application " | Freq: Every day | 0 refills | Status: DC

## 2017-01-13 NOTE — Discharge Instructions (Signed)
Back Pain in Pregnancy Introduction Back pain during pregnancy is common. Back pain may be caused by several factors that are related to changes during your pregnancy. Follow these instructions at home: Managing pain, stiffness, and swelling  If directed, apply ice for sudden (acute) back pain.  Put ice in a plastic bag.  Place a towel between your skin and the bag.  Leave the ice on for 20 minutes, 2-3 times per day.  If directed, apply heat to the affected area before you exercise:  Place a towel between your skin and the heat pack or heating pad.  Leave the heat on for 20-30 minutes.  Remove the heat if your skin turns bright red. This is especially important if you are unable to feel pain, heat, or cold. You may have a greater risk of getting burned. Activity  Exercise as told by your health care provider. Exercising is the best way to prevent or manage back pain.  Listen to your body when lifting. If lifting hurts, ask for help or bend your knees. This uses your leg muscles instead of your back muscles.  Squat down when picking up something from the floor. Do not bend over.  Only use bed rest as told by your health care provider. Bed rest should only be used for the most severe episodes of back pain. Standing, Sitting, and Lying Down  Do not stand in one place for long periods of time.  Use good posture when sitting. Make sure your head rests over your shoulders and is not hanging forward. Use a pillow on your lower back if necessary.  Try sleeping on your side, preferably the left side, with a pillow or two between your legs. If you are sore after a night's rest, your bed may be too soft. A firm mattress may provide more support for your back during pregnancy. General instructions  Do not wear high heels.  Eat a healthy diet. Try to gain weight within your health care provider's recommendations.  Use a maternity girdle, elastic sling, or back brace as told by your  health care provider.  Take over-the-counter and prescription medicines only as told by your health care provider.  Keep all follow-up visits as told by your health care provider. This is important. This includes any visits with any specialists, such as a physical therapist. Contact a health care provider if:  Your back pain interferes with your daily activities.  You have increasing pain in other parts of your body. Get help right away if:  You develop numbness, tingling, weakness, or problems with the use of your arms or legs.  You develop severe back pain that is not controlled with medicine.  You have a sudden change in bowel or bladder control.  You develop shortness of breath, dizziness, or you faint.  You develop nausea, vomiting, or sweating.  You have back pain that is a rhythmic, cramping pain similar to labor pains. Labor pain is usually 1-2 minutes apart, lasts for about 1 minute, and involves a bearing down feeling or pressure in your pelvis.  You have back pain and your water breaks or you have vaginal bleeding.  You have back pain or numbness that travels down your leg.  Your back pain developed after you fell.  You develop pain on one side of your back.  You see blood in your urine.  You develop skin blisters in the area of your back pain. This information is not intended to replace advice given to   you by your health care provider. Make sure you discuss any questions you have with your health care provider. Document Released: 03/11/2006 Document Revised: 05/08/2016 Document Reviewed: 08/15/2015  2017 Elsevier  

## 2017-01-13 NOTE — MAU Provider Note (Signed)
History     CSN: [id number]  Arrival date and time: 01/13/17 1449   First Provider Initiated Contact with Patient 01/13/17 1534      Chief Complaint  Patient presents with  . Back Pain   G4P1021 @32 .6 weeks here with back pain. She reports pain started last evening. She describes as sharp and constant in lower back. Rates pain 8-9/10. Denies fevers. She has not used analgesics. She tried warm bath and had no relief. She denies urinary sx. She reports good FM. No ctx, VB, or LOF. She reports continued loss of mucous plug since seen yesterday. She's concerned that her baby may come early and asking if her cervix will be checked.    OB History    Gravida Para Term Preterm AB Living   4 1 1   2 1    SAB TAB Ectopic Multiple Live Births         1 1      Past Medical History:  Diagnosis Date  . Anxiety   . Asthma    inhaler PRN-hasn't used inhaler in months  . Attention deficit hyperactivity disorder   . Bipolar disorder [facility])    hospitalized as a teen for suicidal ideations and cutting  . Depression   . Gastritis   . GERD (gastroesophageal reflux disease)   . HPV (human papilloma virus) anogenital infection   . Hx of varicella   . Mild preeclampsia 02/09/2016  . Vaginal Pap smear, abnormal     Past Surgical History:  Procedure Laterality Date  . EYE MUSCLE SURGERY Bilateral    07/29/12  . MOUTH SURGERY    . PLANTAR FASCIA SURGERY    . WISDOM TOOTH EXTRACTION      Family History  Problem Relation Age of Onset  . Depression Mother   . Colon cancer Mother   . Colon polyps Mother   . Depression Father   . Depression Brother   . Liver disease    . Kidney disease      Social History  Substance Use Topics  . Smoking status: Never Smoker  . Smokeless tobacco: Never Used  . Alcohol use No    Allergies:  Allergies  Allergen Reactions  . Depakote [Divalproex Sodium] Other (See Comments)    Pt states that this medication makes her blood levels toxic.    [name]  Methylphenidate Derivatives Other (See Comments)    Reaction:  Depression and anger   . Neurontin [Gabapentin] Other (See Comments)    Reaction:  Dizziness   . Prozac [Fluoxetine Hcl] Other (See Comments)    Reaction:  Anger     Prescriptions Prior to Admission  Medication Sig Dispense Refill Last Dose  . ARIPiprazole (ABILIFY) 15 MG tablet Take 15 mg by mouth at bedtime.    01/11/2017 at Unknown time  . Prenatal Vit-Fe Fumarate-FA (PRENATAL MULTIVITAMIN) TABS tablet Take 1 tablet by mouth at bedtime.    01/11/2017 at Unknown time    Review of Systems  Gastrointestinal: Negative for abdominal pain.  Genitourinary: Negative.  Negative for vaginal bleeding.  Musculoskeletal: Positive for back pain.   Physical Exam   Blood pressure 117/86, pulse 92, temperature 98.2 F (36.8 C), temperature source Oral, resp. rate 18, height 5\' 6"  (1.676 m), weight 93 kg (205 lb), SpO2 99 %, not currently breastfeeding.  Physical Exam  Nursing note and vitals reviewed. Constitutional: She is oriented to person, place, and time. She appears well-developed and well-nourished. No distress (appears comfortable).  HENT:  Head: Normocephalic and atraumatic.  Neck: Normal range of motion.  Cardiovascular: Normal rate.   Respiratory: Effort normal.  GI: Soft. She exhibits no distension. There is no tenderness. There is no CVA tenderness.  gravid  Genitourinary:  Genitourinary Comments: SVE: 1.5/50/-3  Musculoskeletal: Normal range of motion.       Lumbar back: She exhibits tenderness.  Neurological: She is alert and oriented to person, place, and time.  Skin: Skin is warm and dry.  Psychiatric: She has a normal mood and affect.  EFM: 135 bpm, mod variability, + accels, no decels Toco: none  Results for orders placed or performed during the hospital encounter of 01/13/17 (from the past 24 hour(s))  Urinalysis, Routine w reflex microscopic     Status: Abnormal   Collection Time: 01/13/17  3:17 PM   Result Value Ref Range   Color, Urine AMBER (A) YELLOW   APPearance CLOUDY (A) CLEAR   Specific Gravity, Urine 1.029 1.005 - 1.030   pH 5.0 5.0 - 8.0   Glucose, UA NEGATIVE NEGATIVE mg/dL   Hgb urine dipstick SMALL (A) NEGATIVE   Bilirubin Urine NEGATIVE NEGATIVE   Ketones, ur NEGATIVE NEGATIVE mg/dL   Protein, ur 100 (A) NEGATIVE mg/dL   Nitrite NEGATIVE NEGATIVE   Leukocytes, UA LARGE (A) NEGATIVE   RBC / HPF 6-30 0 - 5 RBC/hpf   WBC, UA TOO NUMEROUS TO COUNT 0 - 5 WBC/hpf   Bacteria, UA MANY (A) NONE SEEN   Squamous Epithelial / LPF TOO NUMEROUS TO COUNT (A) NONE SEEN   Mucous PRESENT     MAU Course  Procedures  MDM Labs ordered and reviewed. No evidence of PTL. Pain likely MSK. UA likely contaminated, will send UC. Stable for discharge home.  Assessment and Plan   1. [redacted] weeks gestation of pregnancy   2. NST (non-stress test) reactive   3. Back pain affecting pregnancy in third trimester    Discharge home Tylenol prn Warm bath Maternity support belt Follow up as scheduled next week at [facility] as of 01/13/2017      Reactions   Depakote [divalproex Sodium] Other (See Comments)   Pt states that this medication makes her blood levels toxic.     Methylphenidate Derivatives Other (See Comments)   Reaction:  Depression and anger    Neurontin [gabapentin] Other (See Comments)   Reaction:  Dizziness    Prozac [fluoxetine Hcl] Other (See Comments)   Reaction:  Anger       Medication List    TAKE these medications   ARIPiprazole 15 MG tablet Commonly known as:  ABILIFY Take 15 mg by mouth at bedtime.   COMFORT FIT MATERNITY SUPP LG Misc 1 application by Does not apply route daily.   prenatal multivitamin Tabs tablet Take 1 tablet by mouth at bedtime.      Julianne Handler, CNM 01/13/2017, 3:35 PM

## 2017-01-13 NOTE — MAU Note (Signed)
Pt to MAU with c/o sharp back pain since yesterday. Denies LOF, c/o" mucous plug coming out."

## 2017-01-14 LAB — CULTURE, OB URINE

## 2017-01-16 ENCOUNTER — Inpatient Hospital Stay (HOSPITAL_COMMUNITY)
Admission: AD | Admit: 2017-01-16 | Discharge: 2017-01-16 | Disposition: A | Discharge status: Home / Self Care | Payer: Medicare Other | Source: Ambulatory Visit | Attending: Obstetrics and Gynecology | Admitting: Obstetrics and Gynecology

## 2017-01-16 ENCOUNTER — Inpatient Hospital Stay (EMERGENCY_DEPARTMENT_HOSPITAL)
Admission: AD | Admit: 2017-01-16 | Discharge: 2017-01-16 | Disposition: A | Discharge status: Home / Self Care | Payer: Medicare Other | Source: Ambulatory Visit | Attending: Obstetrics and Gynecology | Admitting: Obstetrics and Gynecology

## 2017-01-16 ENCOUNTER — Encounter (HOSPITAL_COMMUNITY): Payer: Self-pay | Admitting: *Deleted

## 2017-01-16 ENCOUNTER — Encounter (HOSPITAL_COMMUNITY): Payer: Self-pay

## 2017-01-16 DIAGNOSIS — Z3A33 33 weeks gestation of pregnancy: Secondary | ICD-10-CM

## 2017-01-16 DIAGNOSIS — K219 Gastro-esophageal reflux disease without esophagitis: Secondary | ICD-10-CM

## 2017-01-16 DIAGNOSIS — Z800 Family history of malignant neoplasm of digestive organs: Secondary | ICD-10-CM | POA: Insufficient documentation

## 2017-01-16 DIAGNOSIS — Z8371 Family history of colonic polyps: Secondary | ICD-10-CM

## 2017-01-16 DIAGNOSIS — Z3480 Encounter for supervision of other normal pregnancy, unspecified trimester: Secondary | ICD-10-CM

## 2017-01-16 DIAGNOSIS — O4703 False labor before 37 completed weeks of gestation, third trimester: Secondary | ICD-10-CM | POA: Insufficient documentation

## 2017-01-16 DIAGNOSIS — O26893 Other specified pregnancy related conditions, third trimester: Secondary | ICD-10-CM | POA: Insufficient documentation

## 2017-01-16 DIAGNOSIS — O9989 Other specified diseases and conditions complicating pregnancy, childbirth and the puerperium: Secondary | ICD-10-CM | POA: Insufficient documentation

## 2017-01-16 DIAGNOSIS — F319 Bipolar disorder, unspecified: Secondary | ICD-10-CM | POA: Insufficient documentation

## 2017-01-16 DIAGNOSIS — Z79899 Other long term (current) drug therapy: Secondary | ICD-10-CM

## 2017-01-16 DIAGNOSIS — R197 Diarrhea, unspecified: Secondary | ICD-10-CM

## 2017-01-16 DIAGNOSIS — O479 False labor, unspecified: Secondary | ICD-10-CM

## 2017-01-16 DIAGNOSIS — Z818 Family history of other mental and behavioral disorders: Secondary | ICD-10-CM

## 2017-01-16 DIAGNOSIS — O99613 Diseases of the digestive system complicating pregnancy, third trimester: Secondary | ICD-10-CM

## 2017-01-16 DIAGNOSIS — Z841 Family history of disorders of kidney and ureter: Secondary | ICD-10-CM

## 2017-01-16 DIAGNOSIS — F419 Anxiety disorder, unspecified: Secondary | ICD-10-CM | POA: Insufficient documentation

## 2017-01-16 DIAGNOSIS — R109 Unspecified abdominal pain: Secondary | ICD-10-CM | POA: Insufficient documentation

## 2017-01-16 DIAGNOSIS — O99343 Other mental disorders complicating pregnancy, third trimester: Secondary | ICD-10-CM | POA: Insufficient documentation

## 2017-01-16 DIAGNOSIS — Z888 Allergy status to other drugs, medicaments and biological substances status: Secondary | ICD-10-CM

## 2017-01-16 DIAGNOSIS — O99513 Diseases of the respiratory system complicating pregnancy, third trimester: Secondary | ICD-10-CM

## 2017-01-16 LAB — URINALYSIS, ROUTINE W REFLEX MICROSCOPIC
Bilirubin Urine: NEGATIVE
Bilirubin Urine: NEGATIVE
Glucose, UA: NEGATIVE mg/dL
Glucose, UA: NEGATIVE mg/dL
Hgb urine dipstick: NEGATIVE
Hgb urine dipstick: NEGATIVE
Ketones, ur: NEGATIVE mg/dL
Ketones, ur: NEGATIVE mg/dL
Leukocytes, UA: NEGATIVE
Nitrite: NEGATIVE
Nitrite: NEGATIVE
Protein, ur: 100 mg/dL — AB
Protein, ur: 30 mg/dL — AB
Specific Gravity, Urine: 1.025 (ref 1.005–1.030)
Specific Gravity, Urine: 1.034 — ABNORMAL HIGH (ref 1.005–1.030)
pH: 5 (ref 5.0–8.0)
pH: 5 (ref 5.0–8.0)

## 2017-01-16 LAB — FETAL FIBRONECTIN: Fetal Fibronectin: POSITIVE — AB

## 2017-01-16 NOTE — MAU Note (Signed)
PT  SAYS SHE  HAD  SPOTTING  EARLY   YESTERDAY.    MUCUS  PLUG   IS COMING  OUT         FEELS   [facility] - STARTED  AT [place].   LAST SEX- 1 WEEK AGO
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
## 2017-01-16 NOTE — Discharge Instructions (Signed)
Third Trimester of Pregnancy The third trimester is from week 29 through week 40 (months 7 through 9). The third trimester is a time when the unborn baby (fetus) is growing rapidly. At the end of the ninth month, the fetus is about 20 inches in length and weighs 6-10 pounds. Body changes during your third trimester Your body goes through many changes during pregnancy. The changes vary from woman to woman. During the third trimester:  Your weight will continue to increase. You can expect to gain 25-35 pounds (11-16 kg) by the end of the pregnancy.  You may begin to get stretch marks on your hips, abdomen, and breasts.  You may urinate more often because the fetus is moving lower into your pelvis and pressing on your bladder.  You may develop or continue to have heartburn. This is caused by increased hormones that slow down muscles in the digestive tract.  You may develop or continue to have constipation because increased hormones slow digestion and cause the muscles that push waste through your intestines to relax.  You may develop hemorrhoids. These are swollen veins (varicose veins) in the rectum that can itch or be painful.  You may develop swollen, bulging veins (varicose veins) in your legs.  You may have increased body aches in the pelvis, back, or thighs. This is due to weight gain and increased hormones that are relaxing your joints.  You may have changes in your hair. These can include thickening of your hair, rapid growth, and changes in texture. Some women also have hair loss during or after pregnancy, or hair that feels dry or thin. Your hair will most likely return to normal after your baby is born.  Your breasts will continue to grow and they will continue to become tender. A yellow fluid (colostrum) may leak from your breasts. This is the first milk you are producing for your baby.  Your belly button may stick out.  You may notice more swelling in your hands, face, or  ankles.  You may have increased tingling or numbness in your hands, arms, and legs. The skin on your belly may also feel numb.  You may feel short of breath because of your expanding uterus.  You may have more problems sleeping. This can be caused by the size of your belly, increased need to urinate, and an increase in your body's metabolism.  You may notice the fetus "dropping," or moving lower in your abdomen.  You may have increased vaginal discharge.  Your cervix becomes thin and soft (effaced) near your due date. What to expect at prenatal visits You will have prenatal exams every 2 weeks until week 36. Then you will have weekly prenatal exams. During a routine prenatal visit:  You will be weighed to make sure you and the fetus are growing normally.  Your blood pressure will be taken.  Your abdomen will be measured to track your baby's growth.  The fetal heartbeat will be listened to.  Any test results from the previous visit will be discussed.  You may have a cervical check near your due date to see if you have effaced. At around 36 weeks, your health care provider will check your cervix. At the same time, your health care provider will also perform a test on the secretions of the vaginal tissue. This test is to determine if a type of bacteria, Group B streptococcus, is present. Your health care provider will explain this further. Your health care provider may ask you:    What your birth plan is.  How you are feeling.  If you are feeling the baby move.  If you have had any abnormal symptoms, such as leaking fluid, bleeding, severe headaches, or abdominal cramping.  If you are using any tobacco products, including cigarettes, chewing tobacco, and electronic cigarettes.  If you have any questions. Other tests or screenings that may be performed during your third trimester include:  Blood tests that check for low iron levels (anemia).  Fetal testing to check the health,  activity level, and growth of the fetus. Testing is done if you have certain medical conditions or if there are problems during the pregnancy.  Nonstress test (NST). This test checks the health of your baby to make sure there are no signs of problems, such as the baby not getting enough oxygen. During this test, a belt is placed around your belly. The baby is made to move, and its heart rate is monitored during movement. What is false labor? False labor is a condition in which you feel small, irregular tightenings of the muscles in the womb (contractions) that eventually go away. These are called Braxton Hicks contractions. Contractions may last for hours, days, or even weeks before true labor sets in. If contractions come at regular intervals, become more frequent, increase in intensity, or become painful, you should see your health care provider. What are the signs of labor?  Abdominal cramps.  Regular contractions that start at 10 minutes apart and become stronger and more frequent with time.  Contractions that start on the top of the uterus and spread down to the lower abdomen and back.  Increased pelvic pressure and dull back pain.  A watery or bloody mucus discharge that comes from the vagina.  Leaking of amniotic fluid. This is also known as your "water breaking." It could be a slow trickle or a gush. Let your doctor know if it has a color or strange odor. If you have any of these signs, call your health care provider right away, even if it is before your due date. Follow these instructions at home: Eating and drinking  Continue to eat regular, healthy meals.  Do not eat:  Raw meat or meat spreads.  Unpasteurized milk or cheese.  Unpasteurized juice.  Store-made salad.  Refrigerated smoked seafood.  Hot dogs or deli meat, unless they are piping hot.  More than 6 ounces of albacore tuna a week.  Shark, swordfish, king mackerel, or tile fish.  Store-made salads.  Raw  sprouts, such as mung bean or alfalfa sprouts.  Take prenatal vitamins as told by your health care provider.  Take 1000 mg of calcium daily as told by your health care provider.  If you develop constipation:  Take over-the-counter or prescription medicines.  Drink enough fluid to keep your urine clear or pale yellow.  Eat foods that are high in fiber, such as fresh fruits and vegetables, whole grains, and beans.  Limit foods that are high in fat and processed sugars, such as fried and sweet foods. Activity  Exercise only as directed by your health care provider. Healthy pregnant women should aim for 2 hours and 30 minutes of moderate exercise per week. If you experience any pain or discomfort while exercising, stop.  Avoid heavy lifting.  Do not exercise in extreme heat or humidity, or at high altitudes.  Wear low-heel, comfortable shoes.  Practice good posture.  Do not travel far distances unless it is absolutely necessary and only with the approval   of your health care provider.  Wear your seat belt at all times while in a car, on a bus, or on a plane.  Take frequent breaks and rest with your legs elevated if you have leg cramps or low back pain.  Do not use hot tubs, steam rooms, or saunas.  You may continue to have sex unless your health care provider tells you otherwise. Lifestyle  Do not use any products that contain nicotine or tobacco, such as cigarettes and e-cigarettes. If you need help quitting, ask your health care provider.  Do not drink alcohol.  Do not use any medicinal herbs or unprescribed drugs. These chemicals affect the formation and growth of the baby.  If you develop varicose veins:  Wear support pantyhose or compression stockings as told by your healthcare provider.  Elevate your feet for 15 minutes, 3-4 times a day.  Wear a supportive maternity bra to help with breast tenderness. General instructions  Take over-the-counter and prescription  medicines only as told by your health care provider. There are medicines that are either safe or unsafe to take during pregnancy.  Take warm sitz baths to soothe any pain or discomfort caused by hemorrhoids. Use hemorrhoid cream or witch hazel if your health care provider approves.  Avoid cat litter boxes and soil used by cats. These carry germs that can cause birth defects in the baby. If you have a cat, ask someone to clean the litter box for you.  To prepare for the arrival of your baby:  Take prenatal classes to understand, practice, and ask questions about the labor and delivery.  Make a trial run to the hospital.  Visit the hospital and tour the maternity area.  Arrange for maternity or paternity leave through employers.  Arrange for family and friends to take care of pets while you are in the hospital.  Purchase a rear-facing car seat and make sure you know how to install it in your car.  Pack your hospital bag.  Prepare the baby's nursery. Make sure to remove all pillows and stuffed animals from the baby's crib to prevent suffocation.  Visit your dentist if you have not gone during your pregnancy. Use a soft toothbrush to brush your teeth and be gentle when you floss.  Keep all prenatal follow-up visits as told by your health care provider. This is important. Contact a health care provider if:  You are unsure if you are in labor or if your water has broken.  You become dizzy.  You have mild pelvic cramps, pelvic pressure, or nagging pain in your abdominal area.  You have lower back pain.  You have persistent nausea, vomiting, or diarrhea.  You have an unusual or bad smelling vaginal discharge.  You have pain when you urinate. Get help right away if:  You have a fever.  You are leaking fluid from your vagina.  You have spotting or bleeding from your vagina.  You have severe abdominal pain or cramping.  You have rapid weight loss or weight gain.  You have  shortness of breath with chest pain.  You notice sudden or extreme swelling of your face, hands, ankles, feet, or legs.  Your baby makes fewer than 10 movements in 2 hours.  You have severe headaches that do not go away with medicine.  You have vision changes. Summary  The third trimester is from week 29 through week 40, months 7 through 9. The third trimester is a time when the unborn baby (fetus)   is growing rapidly.  During the third trimester, your discomfort may increase as you and your baby continue to gain weight. You may have abdominal, leg, and back pain, sleeping problems, and an increased need to urinate.  During the third trimester your breasts will keep growing and they will continue to become tender. A yellow fluid (colostrum) may leak from your breasts. This is the first milk you are producing for your baby.  False labor is a condition in which you feel small, irregular tightenings of the muscles in the womb (contractions) that eventually go away. These are called Braxton Hicks contractions. Contractions may last for hours, days, or even weeks before true labor sets in.  Signs of labor can include: abdominal cramps; regular contractions that start at 10 minutes apart and become stronger and more frequent with time; watery or bloody mucus discharge that comes from the vagina; increased pelvic pressure and dull back pain; and leaking of amniotic fluid. This information is not intended to replace advice given to you by your health care provider. Make sure you discuss any questions you have with your health care provider. Document Released: 11/25/2001 Document Revised: 05/08/2016 Document Reviewed: 02/01/2013 [employer] Interactive Patient Education  2017 [employer]  

## 2017-01-16 NOTE — Discharge Instructions (Signed)
Braxton Hicks Contractions °Contractions of the uterus can occur throughout pregnancy. Contractions are not always a sign that you are in labor.  °WHAT ARE BRAXTON HICKS CONTRACTIONS?  °Contractions that occur before labor are called Braxton Hicks contractions, or false labor. Toward the end of pregnancy (32-34 weeks), these contractions can develop more often and may become more forceful. This is not true labor because these contractions do not result in opening (dilatation) and thinning of the cervix. They are sometimes difficult to tell apart from true labor because these contractions can be forceful and people have different pain tolerances. You should not feel embarrassed if you go to the hospital with false labor. Sometimes, the only way to tell if you are in true labor is for your health care provider to look for changes in the cervix. °If there are no prenatal problems or other health problems associated with the pregnancy, it is completely safe to be sent home with false labor and await the onset of true labor. °HOW CAN YOU TELL THE DIFFERENCE BETWEEN TRUE AND FALSE LABOR? °False Labor  °· The contractions of false labor are usually shorter and not as hard as those of true labor.   °· The contractions are usually irregular.   °· The contractions are often felt in the front of the lower abdomen and in the groin.   °· The contractions may go away when you walk around or change positions while lying down.   °· The contractions get weaker and are shorter lasting as time goes on.   °· The contractions do not usually become progressively stronger, regular, and closer together as with true labor.   °True Labor  °· Contractions in true labor last 30-70 seconds, become very regular, usually become more intense, and increase in frequency.   °· The contractions do not go away with walking.   °· The discomfort is usually felt in the top of the uterus and spreads to the lower abdomen and low back.   °· True labor can be  determined by your health care provider with an exam. This will show that the cervix is dilating and getting thinner.   °WHAT TO REMEMBER °· Keep up with your usual exercises and follow other instructions given by your health care provider.   °· Take medicines as directed by your health care provider.   °· Keep your regular prenatal appointments.   °· Eat and drink lightly if you think you are going into labor.   °· If Braxton Hicks contractions are making you uncomfortable:   °¨ Change your position from lying down or resting to walking, or from walking to resting.   °¨ Sit and rest in a tub of warm water.   °¨ Drink 2-3 glasses of water. Dehydration may cause these contractions.   °¨ Do slow and deep breathing several times an hour.   °WHEN SHOULD I SEEK IMMEDIATE MEDICAL CARE? °Seek immediate medical care if: °· Your contractions become stronger, more regular, and closer together.   °· You have fluid leaking or gushing from your vagina.   °· You have a fever.   °· You pass blood-tinged mucus.   °· You have vaginal bleeding.   °· You have continuous abdominal pain.   °· You have low back pain that you never had before.   °· You feel your baby's head pushing down and causing pelvic pressure.   °· Your baby is not moving as much as it used to.   °This information is not intended to replace advice given to you by your health care provider. Make sure you discuss any questions you have with your health care   provider. °Document Released: 12/01/2005 Document Revised: 03/24/2016 Document Reviewed: 09/12/2013 °Elsevier Interactive Patient Education © 2017 [employer] ° °

## 2017-01-19 ENCOUNTER — Encounter (HOSPITAL_COMMUNITY): Payer: Self-pay | Admitting: *Deleted

## 2017-01-19 ENCOUNTER — Telehealth: Payer: Self-pay

## 2017-01-19 ENCOUNTER — Inpatient Hospital Stay (HOSPITAL_COMMUNITY)
Admission: AD | Admit: 2017-01-19 | Discharge: 2017-01-19 | Disposition: A | Discharge status: Home / Self Care | Payer: Medicare Other | Source: Ambulatory Visit | Attending: Obstetrics & Gynecology | Admitting: Obstetrics & Gynecology

## 2017-01-19 DIAGNOSIS — O99513 Diseases of the respiratory system complicating pregnancy, third trimester: Secondary | ICD-10-CM | POA: Diagnosis present

## 2017-01-19 DIAGNOSIS — J45909 Unspecified asthma, uncomplicated: Secondary | ICD-10-CM | POA: Insufficient documentation

## 2017-01-19 DIAGNOSIS — Z3A33 33 weeks gestation of pregnancy: Secondary | ICD-10-CM

## 2017-01-19 DIAGNOSIS — N859 Noninflammatory disorder of uterus, unspecified: Secondary | ICD-10-CM

## 2017-01-19 DIAGNOSIS — R350 Frequency of micturition: Secondary | ICD-10-CM | POA: Insufficient documentation

## 2017-01-19 DIAGNOSIS — Z800 Family history of malignant neoplasm of digestive organs: Secondary | ICD-10-CM | POA: Diagnosis not present

## 2017-01-19 DIAGNOSIS — F419 Anxiety disorder, unspecified: Secondary | ICD-10-CM | POA: Insufficient documentation

## 2017-01-19 DIAGNOSIS — Z3480 Encounter for supervision of other normal pregnancy, unspecified trimester: Secondary | ICD-10-CM

## 2017-01-19 DIAGNOSIS — F319 Bipolar disorder, unspecified: Secondary | ICD-10-CM | POA: Insufficient documentation

## 2017-01-19 DIAGNOSIS — Z9889 Other specified postprocedural states: Secondary | ICD-10-CM | POA: Diagnosis not present

## 2017-01-19 DIAGNOSIS — R112 Nausea with vomiting, unspecified: Secondary | ICD-10-CM | POA: Diagnosis not present

## 2017-01-19 DIAGNOSIS — O99613 Diseases of the digestive system complicating pregnancy, third trimester: Secondary | ICD-10-CM | POA: Insufficient documentation

## 2017-01-19 DIAGNOSIS — Z818 Family history of other mental and behavioral disorders: Secondary | ICD-10-CM | POA: Diagnosis not present

## 2017-01-19 DIAGNOSIS — N858 Other specified noninflammatory disorders of uterus: Secondary | ICD-10-CM

## 2017-01-19 DIAGNOSIS — Z8371 Family history of colonic polyps: Secondary | ICD-10-CM | POA: Diagnosis not present

## 2017-01-19 DIAGNOSIS — Z79899 Other long term (current) drug therapy: Secondary | ICD-10-CM | POA: Insufficient documentation

## 2017-01-19 DIAGNOSIS — Z888 Allergy status to other drugs, medicaments and biological substances status: Secondary | ICD-10-CM | POA: Diagnosis not present

## 2017-01-19 DIAGNOSIS — Z841 Family history of disorders of kidney and ureter: Secondary | ICD-10-CM | POA: Insufficient documentation

## 2017-01-19 DIAGNOSIS — K219 Gastro-esophageal reflux disease without esophagitis: Secondary | ICD-10-CM | POA: Insufficient documentation

## 2017-01-19 DIAGNOSIS — O99343 Other mental disorders complicating pregnancy, third trimester: Secondary | ICD-10-CM | POA: Insufficient documentation

## 2017-01-19 DIAGNOSIS — O26893 Other specified pregnancy related conditions, third trimester: Secondary | ICD-10-CM | POA: Insufficient documentation

## 2017-01-19 DIAGNOSIS — R300 Dysuria: Secondary | ICD-10-CM | POA: Insufficient documentation

## 2017-01-19 LAB — URINALYSIS, ROUTINE W REFLEX MICROSCOPIC
Bilirubin Urine: NEGATIVE
Glucose, UA: NEGATIVE mg/dL
Hgb urine dipstick: NEGATIVE
Ketones, ur: NEGATIVE mg/dL
Nitrite: NEGATIVE
Protein, ur: 100 mg/dL — AB
Specific Gravity, Urine: 1.031 — ABNORMAL HIGH (ref 1.005–1.030)
pH: 5 (ref 5.0–8.0)

## 2017-01-19 MED ORDER — BETAMETHASONE SOD PHOS & ACET 6 (3-3) MG/ML IJ SUSP
12.0000 mg | Freq: Once | INTRAMUSCULAR | Status: AC
  Administered 2017-01-19: 12 mg via INTRAMUSCULAR
  Filled 2017-01-19: qty 2

## 2017-01-19 NOTE — MAU Note (Addendum)
Arrived by EMS for U/C's 6 minutes for the last 30 min. Per EMS.  Denies vaginal bleeding or ROM.  Good fetal movement.  Pt states she has been having burning with urination and feels as if she is "opening up down there".

## 2017-01-19 NOTE — Telephone Encounter (Signed)
TC from pt with multiple questions about PTL and IOL. All questions answered. Pt aware to keep upcoming appt on 01/21/17 to address any further questions she may have.

## 2017-01-19 NOTE — Discharge Instructions (Signed)
Introduction Patient Name: ________________________________________________ Patient Due Date: ____________________ What is a fetal movement count? A fetal movement count is the number of times that you feel your baby move during a certain amount of time. This may also be called a fetal kick count. A fetal movement count is recommended for every pregnant woman. You may be asked to start counting fetal movements as early as week 28 of your pregnancy. Pay attention to when your baby is most active. You may notice your baby's sleep and wake cycles. You may also notice things that make your baby move more. You should do a fetal movement count:  When your baby is normally most active.  At the same time each day. A good time to count movements is while you are resting, after having something to eat and drink. How do I count fetal movements? 1. Find a quiet, comfortable area. Sit, or lie down on your side. 2. Write down the date, the start time and stop time, and the number of movements that you felt between those two times. Take this information with you to your health care visits. 3. For 2 hours, count kicks, flutters, swishes, rolls, and jabs. You should feel at least 10 movements during 2 hours. 4. You may stop counting after you have felt 10 movements. 5. If you do not feel 10 movements in 2 hours, have something to eat and drink. Then, keep resting and counting for 1 hour. If you feel at least 4 movements during that hour, you may stop counting. Contact a health care provider if:  You feel fewer than 4 movements in 2 hours.  Your baby is not moving like he or she usually does. Date: ____________ Start time: ____________ Stop time: ____________ Movements: ____________ Date: ____________ Start time: ____________ Stop time: ____________ Movements: ____________ Date: ____________ Start time: ____________ Stop time: ____________ Movements: ____________ Date: ____________ Start time: ____________  Stop time: ____________ Movements: ____________ Date: ____________ Start time: ____________ Stop time: ____________ Movements: ____________ Date: ____________ Start time: ____________ Stop time: ____________ Movements: ____________ Date: ____________ Start time: ____________ Stop time: ____________ Movements: ____________ Date: ____________ Start time: ____________ Stop time: ____________ Movements: ____________ Date: ____________ Start time: ____________ Stop time: ____________ Movements: ____________ This information is not intended to replace advice given to you by your health care provider. Make sure you discuss any questions you have with your health care provider. Document Released: 12/31/2006 Document Revised: 07/30/2016 Document Reviewed: 01/10/2016 Elsevier Interactive Patient Education  2017 [employer] of the uterus can occur throughout pregnancy. Contractions are not always a sign that you are in labor.  WHAT ARE BRAXTON HICKS CONTRACTIONS?  Contractions that occur before labor are called Braxton Hicks contractions, or false labor. Toward the end of pregnancy (32-34 weeks), these contractions can develop more often and may become more forceful. This is not true labor because these contractions do not result in opening (dilatation) and thinning of the cervix. They are sometimes difficult to tell apart from true labor because these contractions can be forceful and people have different pain tolerances. You should not feel embarrassed if you go to the hospital with false labor. Sometimes, the only way to tell if you are in true labor is for your health care provider to look for changes in the cervix. If there are no prenatal problems or other health problems associated with the pregnancy, it is completely safe to be sent home with false labor and await the onset of true labor.  HOW CAN YOU TELL THE DIFFERENCE BETWEEN TRUE AND FALSE LABOR? False Labor    The contractions of false labor are usually shorter and not as hard as those of true labor.   The contractions are usually irregular.   The contractions are often felt in the front of the lower abdomen and in the groin.   The contractions may go away when you walk around or change positions while lying down.   The contractions get weaker and are shorter lasting as time goes on.   The contractions do not usually become progressively stronger, regular, and closer together as with true labor.  True Labor   Contractions in true labor last 30-70 seconds, become very regular, usually become more intense, and increase in frequency.   The contractions do not go away with walking.   The discomfort is usually felt in the top of the uterus and spreads to the lower abdomen and low back.   True labor can be determined by your health care provider with an exam. This will show that the cervix is dilating and getting thinner.  WHAT TO REMEMBER  Keep up with your usual exercises and follow other instructions given by your health care provider.   Take medicines as directed by your health care provider.   Keep your regular prenatal appointments.   Eat and drink lightly if you think you are going into labor.   If Braxton Hicks contractions are making you uncomfortable:   Change your position from lying down or resting to walking, or from walking to resting.   Sit and rest in a tub of warm water.   Drink 2-3 glasses of water. Dehydration may cause these contractions.   Do slow and deep breathing several times an hour.  WHEN SHOULD I SEEK IMMEDIATE MEDICAL CARE? Seek immediate medical care if:  Your contractions become stronger, more regular, and closer together.   You have fluid leaking or gushing from your vagina.   You have a fever.   You pass blood-tinged mucus.   You have vaginal bleeding.   You have continuous abdominal pain.   You have low back pain  that you never had before.   You feel your baby's head pushing down and causing pelvic pressure.   Your baby is not moving as much as it used to.  This information is not intended to replace advice given to you by your health care provider. Make sure you discuss any questions you have with your health care provider. Document Released: 12/01/2005 Document Revised: 03/24/2016 Document Reviewed: 09/12/2013 Elsevier Interactive Patient Education  2017 [employer].

## 2017-01-19 NOTE — MAU Provider Note (Signed)
History     CSN: [id number]  Arrival date and time: 01/19/17 [id number]   First Provider Initiated Contact with Patient 01/19/17 1814      Chief Complaint  Patient presents with  . Labor Eval   HPI [name] is a 31 y.o. [id number] at [redacted]w[redacted]d who presents to MAU today with complaint of contractions q 6 minutes for the last 30 minutes. She states that contractions are less frequent now. She denies vaginal bleeding, discharge, LOF today. She states some dysuria and increased frequency of urination today. She denies fever. She had one episode of N/V this morning. She reports good fetal movement.   OB History    Gravida Para Term Preterm AB Living   4 1 1   2 1    SAB TAB Ectopic Multiple Live Births         1 1      Past Medical History:  Diagnosis Date  . Anxiety   . Asthma    inhaler PRN-hasn't used inhaler in months  . Attention deficit hyperactivity disorder   . Bipolar disorder [facility])    hospitalized as a teen for suicidal ideations and cutting  . Depression   . Gastritis   . GERD (gastroesophageal reflux disease)   . HPV (human papilloma virus) anogenital infection   . Hx of varicella   . Mild preeclampsia 02/09/2016  . Vaginal Pap smear, abnormal     Past Surgical History:  Procedure Laterality Date  . EYE MUSCLE SURGERY Bilateral    07/29/12  . MOUTH SURGERY    . PLANTAR FASCIA SURGERY    . WISDOM TOOTH EXTRACTION      Family History  Problem Relation Age of Onset  . Depression Mother   . Colon cancer Mother   . Colon polyps Mother   . Depression Father   . Depression Brother   . Liver disease    . Kidney disease      Social History  Substance Use Topics  . Smoking status: Never Smoker  . Smokeless tobacco: Never Used  . Alcohol use No    Allergies:  Allergies  Allergen Reactions  . Depakote [Divalproex Sodium] Other (See Comments)    Pt states that this medication makes her blood levels toxic.    [name] Methylphenidate Derivatives Other (See  Comments)    Reaction:  Depression and anger   . Neurontin [Gabapentin] Other (See Comments)    Reaction:  Dizziness   . Prozac [Fluoxetine Hcl] Other (See Comments)    Reaction:  Anger     Prescriptions Prior to Admission  Medication Sig Dispense Refill Last Dose  . ARIPiprazole (ABILIFY) 15 MG tablet Take 15 mg by mouth at bedtime.    Past Week at Unknown time  . Elastic Bandages & Supports (COMFORT FIT MATERNITY SUPP LG) MISC 1 application by Does not apply route daily. 1 each 0   . Prenatal Vit-Fe Fumarate-FA (PRENATAL MULTIVITAMIN) TABS tablet Take 1 tablet by mouth at bedtime.    01/15/2017 at Unknown time    Review of Systems  Constitutional: Negative for fever.  Gastrointestinal: Positive for abdominal pain, nausea and vomiting. Negative for constipation and diarrhea.  Genitourinary: Positive for dysuria and frequency. Negative for flank pain, urgency, vaginal bleeding and vaginal discharge.   Physical Exam   Blood pressure 121/85, pulse 95, temperature 98.2 F (36.8 C), temperature source Oral, resp. rate 20, SpO2 100 %, not currently breastfeeding.  Physical Exam  Nursing note and vitals reviewed.  Constitutional: She is oriented to person, place, and time. She appears well-developed and well-nourished. No distress.  HENT:  Head: Normocephalic and atraumatic.  Cardiovascular: Normal rate.   Respiratory: Effort normal.  GI: Soft. She exhibits no distension.  Neurological: She is alert and oriented to person, place, and time.  Skin: Skin is warm and dry. No erythema.  Psychiatric: She has a normal mood and affect.  Dilation: 2 Effacement (%): Thick Exam by:: Tomi Bamberger, NP   Results for orders placed or performed during the hospital encounter of 01/19/17 (from the past 24 hour(s))  Urinalysis, Routine w reflex microscopic     Status: Abnormal   Collection Time: 01/19/17  5:55 PM  Result Value Ref Range   Color, Urine YELLOW YELLOW   APPearance HAZY (A) CLEAR    Specific Gravity, Urine 1.031 (H) 1.005 - 1.030   pH 5.0 5.0 - 8.0   Glucose, UA NEGATIVE NEGATIVE mg/dL   Hgb urine dipstick NEGATIVE NEGATIVE   Bilirubin Urine NEGATIVE NEGATIVE   Ketones, ur NEGATIVE NEGATIVE mg/dL   Protein, ur 100 (A) NEGATIVE mg/dL   Nitrite NEGATIVE NEGATIVE   Leukocytes, UA TRACE (A) NEGATIVE   RBC / HPF 0-5 0 - 5 RBC/hpf   WBC, UA 0-5 0 - 5 WBC/hpf   Bacteria, UA RARE (A) NONE SEEN   Squamous Epithelial / LPF 6-30 (A) NONE SEEN   Mucous PRESENT     Fetal Monitoring: Baseline: 130 bpm Variability: moderate Accelerations: 15 x 15 Decelerations: none Contractions: mild UI  MAU Course  Procedures None  MDM UA today  +FFN at last visit, BMZ was not given. Will given BMZ today.  No contractions noted, only uterine irritability on EFM today. Patient's cervix is unchanged from previous visit 3 days ago.  Assessment and Plan  A: SIUP at [redacted]w[redacted]d Uterine irritability   P:  Discharge home Preterm labor precautions discussed Patient advised to follow-up in MAU for second dose of BMZ tomorrow evening Patient may return to MAU as needed or if her condition were to change or worsen sooner  Luvenia Redden, PA-C  01/19/2017, 6:46 PM

## 2017-01-20 ENCOUNTER — Encounter (HOSPITAL_COMMUNITY): Payer: Self-pay

## 2017-01-20 ENCOUNTER — Inpatient Hospital Stay (HOSPITAL_COMMUNITY)
Admission: AD | Admit: 2017-01-20 | Discharge: 2017-01-20 | Disposition: A | Discharge status: Home / Self Care | Payer: Medicare Other | Source: Ambulatory Visit | Attending: Family Medicine | Admitting: Family Medicine

## 2017-01-20 DIAGNOSIS — Z3493 Encounter for supervision of normal pregnancy, unspecified, third trimester: Secondary | ICD-10-CM

## 2017-01-20 DIAGNOSIS — F419 Anxiety disorder, unspecified: Secondary | ICD-10-CM | POA: Diagnosis not present

## 2017-01-20 DIAGNOSIS — Z3A33 33 weeks gestation of pregnancy: Secondary | ICD-10-CM

## 2017-01-20 DIAGNOSIS — O4703 False labor before 37 completed weeks of gestation, third trimester: Secondary | ICD-10-CM | POA: Diagnosis not present

## 2017-01-20 DIAGNOSIS — Z79899 Other long term (current) drug therapy: Secondary | ICD-10-CM | POA: Insufficient documentation

## 2017-01-20 DIAGNOSIS — O99513 Diseases of the respiratory system complicating pregnancy, third trimester: Secondary | ICD-10-CM | POA: Diagnosis not present

## 2017-01-20 DIAGNOSIS — Z841 Family history of disorders of kidney and ureter: Secondary | ICD-10-CM | POA: Diagnosis not present

## 2017-01-20 DIAGNOSIS — Z8371 Family history of colonic polyps: Secondary | ICD-10-CM | POA: Insufficient documentation

## 2017-01-20 DIAGNOSIS — Z818 Family history of other mental and behavioral disorders: Secondary | ICD-10-CM | POA: Insufficient documentation

## 2017-01-20 DIAGNOSIS — Z9889 Other specified postprocedural states: Secondary | ICD-10-CM | POA: Insufficient documentation

## 2017-01-20 DIAGNOSIS — O99613 Diseases of the digestive system complicating pregnancy, third trimester: Secondary | ICD-10-CM | POA: Diagnosis present

## 2017-01-20 DIAGNOSIS — O99343 Other mental disorders complicating pregnancy, third trimester: Secondary | ICD-10-CM | POA: Diagnosis not present

## 2017-01-20 DIAGNOSIS — Z800 Family history of malignant neoplasm of digestive organs: Secondary | ICD-10-CM | POA: Diagnosis not present

## 2017-01-20 DIAGNOSIS — Z888 Allergy status to other drugs, medicaments and biological substances status: Secondary | ICD-10-CM | POA: Insufficient documentation

## 2017-01-20 DIAGNOSIS — Z3480 Encounter for supervision of other normal pregnancy, unspecified trimester: Secondary | ICD-10-CM

## 2017-01-20 DIAGNOSIS — K219 Gastro-esophageal reflux disease without esophagitis: Secondary | ICD-10-CM | POA: Diagnosis not present

## 2017-01-20 DIAGNOSIS — N859 Noninflammatory disorder of uterus, unspecified: Secondary | ICD-10-CM | POA: Diagnosis not present

## 2017-01-20 DIAGNOSIS — J45909 Unspecified asthma, uncomplicated: Secondary | ICD-10-CM | POA: Diagnosis not present

## 2017-01-20 DIAGNOSIS — F319 Bipolar disorder, unspecified: Secondary | ICD-10-CM | POA: Insufficient documentation

## 2017-01-20 MED ORDER — BETAMETHASONE SOD PHOS & ACET 6 (3-3) MG/ML IJ SUSP
12.0000 mg | Freq: Once | INTRAMUSCULAR | Status: AC
  Administered 2017-01-20: 12 mg via INTRAMUSCULAR
  Filled 2017-01-20: qty 2

## 2017-01-20 NOTE — Discharge Instructions (Signed)
Preterm Labor and Birth Information °Pregnancy normally lasts 39-41 weeks. Preterm labor is when labor starts early. It starts before you have been pregnant for 37 whole weeks. °What are the risk factors for preterm labor? °Preterm labor is more likely to occur in women who: °· Have an infection while pregnant. °· Have a cervix that is short. °· Have gone into preterm labor before. °· Have had surgery on their cervix. °· Are younger than age 30. °· Are older than age 35. °· Are African American. °· Are pregnant with two or more babies. °· Take street drugs while pregnant. °· Smoke while pregnant. °· Do not gain enough weight while pregnant. °· Got pregnant right after another pregnancy. °What are the symptoms of preterm labor? °Symptoms of preterm labor include: °· Cramps. The cramps may feel like the cramps some women get during their period. The cramps may happen with watery poop (diarrhea). °· Pain in the belly (abdomen). °· Pain in the lower back. °· Regular contractions or tightening. It may feel like your belly is getting tighter. °· Pressure in the lower belly that seems to get stronger. °· More fluid (discharge) leaking from the vagina. The fluid may be watery or bloody. °· Water breaking. °Why is it important to notice signs of preterm labor? °Babies who are born early may not be fully developed. They have a higher chance for: °· Long-term heart problems. °· Long-term lung problems. °· Trouble controlling body systems, like breathing. °· Bleeding in the brain. °· A condition called cerebral palsy. °· Learning difficulties. °· Death. °These risks are highest for babies who are born before 34 weeks of pregnancy. °How is preterm labor treated? °Treatment depends on: °· How long you were pregnant. °· Your condition. °· The health of your baby. °Treatment may involve: °· Having a stitch (suture) placed in your cervix. When you give birth, your cervix opens so the baby can come out. The stitch keeps the cervix  from opening too soon. °· Staying at the hospital. °· Taking or getting medicines, such as: °¨ Hormone medicines. °¨ Medicines to stop contractions. °¨ Medicines to help the baby’s lungs develop. °¨ Medicines to prevent your baby from having cerebral palsy. °What should I do if I am in preterm labor? °If you think you are going into labor too soon, call your doctor right away. °How can I prevent preterm labor? °· Do not use any tobacco products. °¨ Examples of these are cigarettes, chewing tobacco, and e-cigarettes. °¨ If you need help quitting, ask your doctor. °· Do not use street drugs. °· Do not use any medicines unless you ask your doctor if they are safe for you. °· Talk with your doctor before taking any herbal supplements. °· Make sure you gain enough weight. °· Watch for infection. If you think you might have an infection, get it checked right away. °· If you have gone into preterm labor before, tell your doctor. °This information is not intended to replace advice given to you by your health care provider. Make sure you discuss any questions you have with your health care provider. °Document Released: 02/27/2009 Document Revised: 05/13/2016 Document Reviewed: 04/23/2016 °Elsevier Interactive Patient Education © 2017 [employer] Inc. ° °

## 2017-01-20 NOTE — MAU Provider Note (Signed)
Chief Complaint:  2nd betamethasone   HPI: [name] is a 31 y.o. [phone] at [redacted]w[redacted]d who presents to maternity admissions reporting "I want my cervix checked" and here for her second dose of BMZ.  Was seen yesterday for contractions in MAU, found to have a cervical check of 2cm/thick (unchanged from previously), but with contractions and a +FFN 1 week ago, BMZ was given as precaution for PTL.  Today, she had some contractions, about every 10 min, lasting about 1-2 hours then went away. Denies contractions now, denies leakage of fluid or vaginal bleeding. Good fetal movement.   Pregnancy Course:   Past Medical History: Past Medical History:  Diagnosis Date  . Anxiety   . Asthma    inhaler PRN-hasn't used inhaler in months  . Attention deficit hyperactivity disorder   . Bipolar disorder [facility])    hospitalized as a teen for suicidal ideations and cutting  . Depression   . Gastritis   . GERD (gastroesophageal reflux disease)   . HPV (human papilloma virus) anogenital infection   . Hx of varicella   . Mild preeclampsia 02/09/2016  . Vaginal Pap smear, abnormal     Past obstetric history: OB History  Gravida Para Term Preterm AB Living  4 1 1   2 1   SAB TAB Ectopic Multiple Live Births        1 1    # Outcome Date GA Lbr Len/2nd Weight Sex Delivery Anes PTL Lv  4A Gravida           4B Current           3 Term 02/11/16 [redacted]w[redacted]d / 01:14 8 lb 2.6 oz (3.702 kg) F Vag-Spont EPI  LIV  2 AB           1 AB               Past Surgical History: Past Surgical History:  Procedure Laterality Date  . EYE MUSCLE SURGERY Bilateral    07/29/12  . MOUTH SURGERY    . PLANTAR FASCIA SURGERY    . WISDOM TOOTH EXTRACTION       Family History: Family History  Problem Relation Age of Onset  . Depression Mother   . Colon cancer Mother   . Colon polyps Mother   . Depression Father   . Depression Brother   . Liver disease    . Kidney disease      Social History: Social History   Substance Use Topics  . Smoking status: Never Smoker  . Smokeless tobacco: Never Used  . Alcohol use No    Allergies:  Allergies  Allergen Reactions  . Depakote [Divalproex Sodium] Other (See Comments)    Pt states that this medication makes her blood levels toxic.    [name] Methylphenidate Derivatives Other (See Comments)    Reaction:  Depression and anger   . Neurontin [Gabapentin] Other (See Comments)    Reaction:  Dizziness   . Prozac [Fluoxetine Hcl] Other (See Comments)    Reaction:  Anger     Meds:  Prescriptions Prior to Admission  Medication Sig Dispense Refill Last Dose  . ARIPiprazole (ABILIFY) 15 MG tablet Take 15 mg by mouth at bedtime.    01/18/2017 at Unknown time  . Elastic Bandages & Supports (COMFORT FIT MATERNITY SUPP LG) MISC 1 application by Does not apply route daily. 1 each 0   . Prenatal Vit-Fe Fumarate-FA (PRENATAL MULTIVITAMIN) TABS tablet Take 1 tablet by mouth at bedtime.  01/18/2017 at Unknown time    I have reviewed patient's Past Medical Hx, Surgical Hx, Family Hx, Social Hx, medications and allergies.   ROS:  A comprehensive ROS was negative except per HPI.    Physical Exam   Patient Vitals for the past 24 hrs:  BP Temp Temp src Pulse Resp  01/20/17 1905 130/75 - - 89 16  01/20/17 1823 132/80 99 F (37.2 C) Oral 93 18   Constitutional: Well-developed, well-nourished female in no acute distress.  Cardiovascular: normal rate Respiratory: normal effort GI: Abd soft, non-tender, gravid appropriate for gestational age. Pos BS x 4 MS: Extremities nontender, no edema, normal ROM Neurologic: Alert and oriented x 4.  GU: Neg CVAT. SVE: 2cm/thick/high/cephalic with bag intact (unchanged from previous exams)     Labs: No results found for this or any previous visit (from the past 24 hour(s)).  Imaging:  No results found.  MAU Course: NST - REACTIVE BPs normal BMZ #2 given  I personally reviewed the patient's NST today, found to be  REACTIVE. 130 bpm, mod var, +accels, no decels. CTX: None.  MDM: Plan of care reviewed with patient, including labs and tests ordered and medical treatment. Discussed with patient there are no signs of progressing dilation or labor. BMZ #2 given today. Reviewed preterm labor precautions with patient.    Assessment: 1. Normal intrauterine pregnancy in third trimester   2. Supervision of other normal pregnancy, antepartum     Plan: Discharge home in stable condition.  Preterm Labor precautions and fetal kick counts  Follow-up [place]. Go in 1 week(s).   Specialty:  Obstetrics and Gynecology Why:  Routine OB care Contact information: [address], [place] [phone]          Allergies as of 01/20/2017      Reactions   Depakote [divalproex Sodium] Other (See Comments)   Pt states that this medication makes her blood levels toxic.     Methylphenidate Derivatives Other (See Comments)   Reaction:  Depression and anger    Neurontin [gabapentin] Other (See Comments)   Reaction:  Dizziness    Prozac [fluoxetine Hcl] Other (See Comments)   Reaction:  Anger       Medication List    TAKE these medications   ARIPiprazole 15 MG tablet Commonly known as:  ABILIFY Take 15 mg by mouth at bedtime.   COMFORT FIT MATERNITY SUPP LG Misc 1 application by Does not apply route daily.   prenatal multivitamin Tabs tablet Take 1 tablet by mouth at bedtime.       Katherine Basset, DO [facility] Fellow [facility], [facility] 01/20/2017 7:08 PM  ]

## 2017-01-20 NOTE — MAU Note (Signed)
Here for 2nd dose of betamethasone, states was contracting earlier today. Not now.  Denies bleeding or leaking.  Would like cervix to be rechecked.

## 2017-01-21 ENCOUNTER — Other Ambulatory Visit: Payer: Medicare Other

## 2017-01-21 ENCOUNTER — Ambulatory Visit (INDEPENDENT_AMBULATORY_CARE_PROVIDER_SITE_OTHER): Payer: Medicare Other | Admitting: Certified Nurse Midwife

## 2017-01-21 VITALS — BP 110/69 | HR 120 | Wt 212.0 lb

## 2017-01-21 DIAGNOSIS — O0930 Supervision of pregnancy with insufficient antenatal care, unspecified trimester: Secondary | ICD-10-CM

## 2017-01-21 DIAGNOSIS — F3130 Bipolar disorder, current episode depressed, mild or moderate severity, unspecified: Secondary | ICD-10-CM

## 2017-01-21 DIAGNOSIS — F900 Attention-deficit hyperactivity disorder, predominantly inattentive type: Secondary | ICD-10-CM

## 2017-01-21 DIAGNOSIS — Z131 Encounter for screening for diabetes mellitus: Secondary | ICD-10-CM | POA: Diagnosis not present

## 2017-01-21 DIAGNOSIS — Z3490 Encounter for supervision of normal pregnancy, unspecified, unspecified trimester: Secondary | ICD-10-CM

## 2017-01-21 DIAGNOSIS — E6609 Other obesity due to excess calories: Secondary | ICD-10-CM

## 2017-01-21 DIAGNOSIS — O99213 Obesity complicating pregnancy, third trimester: Secondary | ICD-10-CM

## 2017-01-21 DIAGNOSIS — F333 Major depressive disorder, recurrent, severe with psychotic symptoms: Secondary | ICD-10-CM

## 2017-01-21 DIAGNOSIS — O0933 Supervision of pregnancy with insufficient antenatal care, third trimester: Secondary | ICD-10-CM

## 2017-01-21 DIAGNOSIS — F79 Unspecified intellectual disabilities: Secondary | ICD-10-CM

## 2017-01-21 DIAGNOSIS — Z6839 Body mass index (BMI) 39.0-39.9, adult: Secondary | ICD-10-CM
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
## 2017-01-21 NOTE — Progress Notes (Signed)
Pt was seen at [facility] yesterday. Pt states she was given shots for baby's lungs. Pt states she is having a reaction to injection.

## 2017-01-22 LAB — CBC
Hematocrit: 36.2 % (ref 34.0–46.6)
Hemoglobin: 11.1 g/dL (ref 11.1–15.9)
MCH: 27.8 pg (ref 26.6–33.0)
MCHC: 30.7 g/dL — ABNORMAL LOW (ref 31.5–35.7)
MCV: 91 fL (ref 79–97)
Platelets: 165 10*3/uL (ref 150–379)
RBC: 3.99 x10E6/uL (ref 3.77–5.28)
RDW: 16.2 % — ABNORMAL HIGH (ref 12.3–15.4)
WBC: 10.9 10*3/uL — ABNORMAL HIGH (ref 3.4–10.8)

## 2017-01-22 LAB — GLUCOSE TOLERANCE, 2 HOURS W/ 1HR
Glucose, 1 hour: 252 mg/dL — ABNORMAL HIGH (ref 65–179)
Glucose, 2 hour: 160 mg/dL — ABNORMAL HIGH (ref 65–152)
Glucose, Fasting: 105 mg/dL — ABNORMAL HIGH (ref 65–91)

## 2017-01-22 LAB — HIV ANTIBODY (ROUTINE TESTING W REFLEX): HIV Screen 4th Generation wRfx: NONREACTIVE

## 2017-01-22 LAB — RPR: RPR Ser Ql: NONREACTIVE

## 2017-01-23 ENCOUNTER — Inpatient Hospital Stay (HOSPITAL_COMMUNITY)
Admission: AD | Admit: 2017-01-23 | Discharge: 2017-01-23 | Disposition: A | Discharge status: Home / Self Care | Payer: Medicare Other | Source: Ambulatory Visit | Attending: Obstetrics and Gynecology | Admitting: Obstetrics and Gynecology

## 2017-01-23 ENCOUNTER — Encounter: Payer: Self-pay | Admitting: Certified Nurse Midwife

## 2017-01-23 ENCOUNTER — Encounter (HOSPITAL_COMMUNITY): Payer: Self-pay | Admitting: *Deleted

## 2017-01-23 DIAGNOSIS — Z800 Family history of malignant neoplasm of digestive organs: Secondary | ICD-10-CM | POA: Diagnosis not present

## 2017-01-23 DIAGNOSIS — Z9889 Other specified postprocedural states: Secondary | ICD-10-CM | POA: Diagnosis not present

## 2017-01-23 DIAGNOSIS — O99513 Diseases of the respiratory system complicating pregnancy, third trimester: Secondary | ICD-10-CM | POA: Insufficient documentation

## 2017-01-23 DIAGNOSIS — O99613 Diseases of the digestive system complicating pregnancy, third trimester: Secondary | ICD-10-CM | POA: Insufficient documentation

## 2017-01-23 DIAGNOSIS — Z888 Allergy status to other drugs, medicaments and biological substances status: Secondary | ICD-10-CM | POA: Diagnosis not present

## 2017-01-23 DIAGNOSIS — K219 Gastro-esophageal reflux disease without esophagitis: Secondary | ICD-10-CM | POA: Insufficient documentation

## 2017-01-23 DIAGNOSIS — Z841 Family history of disorders of kidney and ureter: Secondary | ICD-10-CM | POA: Insufficient documentation

## 2017-01-23 DIAGNOSIS — Z3A34 34 weeks gestation of pregnancy: Secondary | ICD-10-CM | POA: Insufficient documentation

## 2017-01-23 DIAGNOSIS — O99343 Other mental disorders complicating pregnancy, third trimester: Secondary | ICD-10-CM | POA: Insufficient documentation

## 2017-01-23 DIAGNOSIS — F419 Anxiety disorder, unspecified: Secondary | ICD-10-CM | POA: Insufficient documentation

## 2017-01-23 DIAGNOSIS — Z8371 Family history of colonic polyps: Secondary | ICD-10-CM | POA: Diagnosis not present

## 2017-01-23 DIAGNOSIS — Z3490 Encounter for supervision of normal pregnancy, unspecified, unspecified trimester: Secondary | ICD-10-CM

## 2017-01-23 DIAGNOSIS — F319 Bipolar disorder, unspecified: Secondary | ICD-10-CM | POA: Insufficient documentation

## 2017-01-23 DIAGNOSIS — Z818 Family history of other mental and behavioral disorders: Secondary | ICD-10-CM | POA: Diagnosis not present

## 2017-01-23 DIAGNOSIS — J45909 Unspecified asthma, uncomplicated: Secondary | ICD-10-CM | POA: Insufficient documentation

## 2017-01-23 DIAGNOSIS — O4703 False labor before 37 completed weeks of gestation, third trimester: Secondary | ICD-10-CM

## 2017-01-23 DIAGNOSIS — Z79899 Other long term (current) drug therapy: Secondary | ICD-10-CM | POA: Diagnosis not present

## 2017-01-23 NOTE — MAU Note (Signed)
PT  SAYS HER UC  STARTED  3  HRS  AGO .        LAST   SEEN  AT  [facility]  ON  2-7-  .    LAST  SEX - 2 WEEKS  AGO.    TONIGHT  - NO MEDS

## 2017-01-23 NOTE — MAU Provider Note (Signed)
None     Chief Complaint:  Contractions   [name] is  31 y.o. [id number] at [redacted]w[redacted]d presents complaining of Contractions .  aboput 15-20 minutes apart for 3 hours at home, not really feeling them now.    Obstetrical/Gynecological History: OB History    Gravida Para Term Preterm AB Living   4 1 1   2 1    SAB TAB Ectopic Multiple Live Births         1 1     Past Medical History: Past Medical History:  Diagnosis Date  . Anxiety   . Asthma    inhaler PRN-hasn't used inhaler in months  . Attention deficit hyperactivity disorder   . Bipolar disorder [facility])    hospitalized as a teen for suicidal ideations and cutting  . Depression   . Gastritis   . GERD (gastroesophageal reflux disease)   . HPV (human papilloma virus) anogenital infection   . Hx of varicella   . Mild preeclampsia 02/09/2016  . Vaginal Pap smear, abnormal     Past Surgical History: Past Surgical History:  Procedure Laterality Date  . EYE MUSCLE SURGERY Bilateral    07/29/12  . MOUTH SURGERY    . PLANTAR FASCIA SURGERY    . WISDOM TOOTH EXTRACTION      Family History: Family History  Problem Relation Age of Onset  . Depression Mother   . Colon cancer Mother   . Colon polyps Mother   . Depression Father   . Depression Brother   . Liver disease    . Kidney disease      Social History: Social History  Substance Use Topics  . Smoking status: Never Smoker  . Smokeless tobacco: Never Used  . Alcohol use No    Allergies:  Allergies  Allergen Reactions  . Depakote [Divalproex Sodium] Other (See Comments)    Pt states that this medication makes her blood levels toxic.    [name] Methylphenidate Derivatives Other (See Comments)    Reaction:  Depression and anger   . Neurontin [Gabapentin] Other (See Comments)    Reaction:  Dizziness   . Prozac [Fluoxetine Hcl] Other (See Comments)    Reaction:  Anger     Meds:  Prescriptions Prior to Admission  Medication Sig Dispense Refill Last Dose  .  ARIPiprazole (ABILIFY) 15 MG tablet Take 15 mg by mouth at bedtime.    01/18/2017 at Unknown time  . Elastic Bandages & Supports (COMFORT FIT MATERNITY SUPP LG) MISC 1 application by Does not apply route daily. 1 each 0   . Prenatal Vit-Fe Fumarate-FA (PRENATAL MULTIVITAMIN) TABS tablet Take 1 tablet by mouth at bedtime.    01/18/2017 at Unknown time    Review of Systems   Constitutional: Negative for fever and chills Eyes: Negative for visual disturbances Respiratory: Negative for shortness of breath, dyspnea Cardiovascular: Negative for chest pain or palpitations  Gastrointestinal: Negative for vomiting, diarrhea and constipation Genitourinary: Negative for dysuria and urgency Musculoskeletal: Negative for back pain, joint pain, myalgias.  Normal ROM  Neurological: Negative for dizziness and headaches    Physical Exam  Blood pressure 136/76, pulse (!) 48, temperature 98.3 F (36.8 C), temperature source Oral, resp. rate 20, SpO2 98 %, not currently breastfeeding. GENERAL: Well-developed, well-nourished female in no acute distress.  LUNGS: Clear to auscultation bilaterally.  HEART: Regular rate and rhythm. ABDOMEN: Soft, nontender, nondistended, gravid.  EXTREMITIES: Nontender, no edema, 2+ distal pulses. DTR's 2+ CERVICAL EXAM: Dilatation 2cm   Effacement  0%   Station -3   Presentation: cephalic FHT:  Baseline rate 145 bpm   Variability moderate  Accelerations present   Decelerations none Contractions: Every 0 mins   Labs: No results found for this or any previous visit (from the past 24 hour(s)). Imaging Studies:  No results found.  Assessment: [name] is  31 y.o. [phone] at [redacted]w[redacted]d presents with braxtion hicks.  Plan: DC home. OF note, she "failed" a 2 hr gtt 2 days ago, but she had BMZ 4 days ago.  Test in invalid and she knows she will need to repeat it at least 1 week after last BMZ  CRESENZO-DISHMAN,Natane Heward 2/9/20183:23 AM

## 2017-01-24 ENCOUNTER — Observation Stay (HOSPITAL_COMMUNITY)
Admission: AD | Admit: 2017-01-24 | Discharge: 2017-01-26 | Disposition: A | Discharge status: Home / Self Care | Payer: Medicare Other | Source: Ambulatory Visit | Attending: Obstetrics & Gynecology | Admitting: Obstetrics & Gynecology

## 2017-01-24 ENCOUNTER — Observation Stay (HOSPITAL_COMMUNITY): Payer: Medicare Other

## 2017-01-24 ENCOUNTER — Encounter (HOSPITAL_COMMUNITY): Payer: Self-pay | Admitting: *Deleted

## 2017-01-24 DIAGNOSIS — O133 Gestational [pregnancy-induced] hypertension without significant proteinuria, third trimester: Principal | ICD-10-CM | POA: Insufficient documentation

## 2017-01-24 DIAGNOSIS — O24419 Gestational diabetes mellitus in pregnancy, unspecified control: Secondary | ICD-10-CM | POA: Diagnosis not present

## 2017-01-24 DIAGNOSIS — Z3A34 34 weeks gestation of pregnancy: Secondary | ICD-10-CM | POA: Diagnosis not present

## 2017-01-24 DIAGNOSIS — J45909 Unspecified asthma, uncomplicated: Secondary | ICD-10-CM | POA: Insufficient documentation

## 2017-01-24 DIAGNOSIS — O4703 False labor before 37 completed weeks of gestation, third trimester: Secondary | ICD-10-CM | POA: Diagnosis not present

## 2017-01-24 DIAGNOSIS — O139 Gestational [pregnancy-induced] hypertension without significant proteinuria, unspecified trimester: Secondary | ICD-10-CM | POA: Diagnosis present

## 2017-01-24 DIAGNOSIS — R531 Weakness: Secondary | ICD-10-CM | POA: Diagnosis not present

## 2017-01-24 DIAGNOSIS — O1493 Unspecified pre-eclampsia, third trimester: Secondary | ICD-10-CM | POA: Diagnosis not present

## 2017-01-24 LAB — CBC
HCT: 35.7 % — ABNORMAL LOW (ref 36.0–46.0)
Hemoglobin: 11.7 g/dL — ABNORMAL LOW (ref 12.0–15.0)
MCH: 28.7 pg (ref 26.0–34.0)
MCHC: 32.8 g/dL (ref 30.0–36.0)
MCV: 87.5 fL (ref 78.0–100.0)
Platelets: 159 10*3/uL (ref 150–400)
RBC: 4.08 MIL/uL (ref 3.87–5.11)
RDW: 15.5 % (ref 11.5–15.5)
WBC: 9.6 10*3/uL (ref 4.0–10.5)

## 2017-01-24 LAB — PROTEIN / CREATININE RATIO, URINE
Creatinine, Urine: 195 mg/dL
Protein Creatinine Ratio: 0.16 mg/mg{Cre} — ABNORMAL HIGH (ref 0.00–0.15)
Total Protein, Urine: 31 mg/dL

## 2017-01-24 LAB — COMPREHENSIVE METABOLIC PANEL
ALT: 12 U/L — ABNORMAL LOW (ref 14–54)
AST: 15 U/L (ref 15–41)
Albumin: 3.2 g/dL — ABNORMAL LOW (ref 3.5–5.0)
Alkaline Phosphatase: 100 U/L (ref 38–126)
Anion gap: 6 (ref 5–15)
BUN: 12 mg/dL (ref 6–20)
CO2: 26 mmol/L (ref 22–32)
Calcium: 8.8 mg/dL — ABNORMAL LOW (ref 8.9–10.3)
Chloride: 107 mmol/L (ref 101–111)
Creatinine, Ser: 0.79 mg/dL (ref 0.44–1.00)
GFR calc Af Amer: >60 mL/min (ref >60)
GFR calc non Af Amer: >60 mL/min (ref >60)
Glucose, Bld: 80 mg/dL (ref 65–99)
Potassium: 4.7 mmol/L (ref 3.5–5.1)
Sodium: 139 mmol/L (ref 135–145)
Total Bilirubin: 0.4 mg/dL (ref 0.3–1.2)
Total Protein: 6.9 g/dL (ref 6.5–8.1)

## 2017-01-24 LAB — RAPID URINE DRUG SCREEN, HOSP PERFORMED
Amphetamines: NOT DETECTED
Barbiturates: NOT DETECTED
Benzodiazepines: NOT DETECTED
Cocaine: NOT DETECTED
Opiates: NOT DETECTED
Tetrahydrocannabinol: NOT DETECTED

## 2017-01-24 LAB — URINALYSIS, ROUTINE W REFLEX MICROSCOPIC
Bilirubin Urine: NEGATIVE
Glucose, UA: NEGATIVE mg/dL
Hgb urine dipstick: NEGATIVE
Ketones, ur: NEGATIVE mg/dL
Nitrite: NEGATIVE
Protein, ur: 30 mg/dL — AB
Specific Gravity, Urine: 1.025 (ref 1.005–1.030)
pH: 6 (ref 5.0–8.0)

## 2017-01-24 LAB — TYPE AND SCREEN
ABO/RH(D): O POS
Antibody Screen: NEGATIVE

## 2017-01-24 MED ORDER — PRENATAL MULTIVITAMIN CH
1.0000 | ORAL_TABLET | Freq: Every day | ORAL | Status: DC
  Administered 2017-01-25: 1 via ORAL
  Filled 2017-01-24 (×2): qty 1

## 2017-01-24 MED ORDER — HYDRALAZINE HCL 20 MG/ML IJ SOLN: 10.0000 mg | Freq: Once | INTRAMUSCULAR | Status: DC | PRN

## 2017-01-24 MED ORDER — ZOLPIDEM TARTRATE 5 MG PO TABS: 5.0000 mg | ORAL_TABLET | Freq: Every evening | ORAL | Status: DC | PRN

## 2017-01-24 MED ORDER — ACETAMINOPHEN 325 MG PO TABS: 650.0000 mg | ORAL_TABLET | ORAL | Status: DC | PRN

## 2017-01-24 MED ORDER — ARIPIPRAZOLE 15 MG PO TABS
15.0000 mg | ORAL_TABLET | Freq: Every day | ORAL | Status: DC
  Administered 2017-01-24 – 2017-01-25 (×2): 15 mg via ORAL
  Filled 2017-01-24 (×2): qty 1

## 2017-01-24 MED ORDER — LABETALOL HCL 5 MG/ML IV SOLN: 20.0000 mg | INTRAVENOUS | Status: DC | PRN

## 2017-01-24 MED ORDER — DOCUSATE SODIUM 100 MG PO CAPS
100.0000 mg | ORAL_CAPSULE | Freq: Every day | ORAL | Status: DC
  Filled 2017-01-24: qty 1

## 2017-01-24 MED ORDER — CALCIUM CARBONATE ANTACID 500 MG PO CHEW: 2.0000 | CHEWABLE_TABLET | ORAL | Status: DC | PRN

## 2017-01-24 NOTE — Progress Notes (Signed)
Pr refused Group B strep test, wants to talk to drs in morning.    Explained to pt about test and answered questions still wants to wait till tomorrow morning.

## 2017-01-24 NOTE — MAU Note (Signed)
C/o ucs since last night around 2100; denies any SROM; having problems with incontience; c/o dark urine -? UTI;
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
## 2017-01-24 NOTE — MAU Note (Signed)
States that she has blurred vision for past 2 hours;

## 2017-01-25 ENCOUNTER — Encounter: Payer: Self-pay | Admitting: Certified Nurse Midwife

## 2017-01-25 DIAGNOSIS — O133 Gestational [pregnancy-induced] hypertension without significant proteinuria, third trimester: Secondary | ICD-10-CM | POA: Diagnosis not present

## 2017-01-25 DIAGNOSIS — O1493 Unspecified pre-eclampsia, third trimester: Secondary | ICD-10-CM

## 2017-01-25 LAB — GLUCOSE, CAPILLARY
Glucose-Capillary: 90 mg/dL (ref 65–99)
Glucose-Capillary: 96 mg/dL (ref 65–99)
Glucose-Capillary: 82 mg/dL (ref 65–99)
Glucose-Capillary: 83 mg/dL (ref 65–99)

## 2017-01-25 LAB — COMPREHENSIVE METABOLIC PANEL
ALT: 9 U/L — ABNORMAL LOW (ref 14–54)
AST: 14 U/L — ABNORMAL LOW (ref 15–41)
Albumin: 2.3 g/dL — ABNORMAL LOW (ref 3.5–5.0)
Alkaline Phosphatase: 81 U/L (ref 38–126)
Anion gap: 7 (ref 5–15)
BUN: 10 mg/dL (ref 6–20)
CO2: 24 mmol/L (ref 22–32)
Calcium: 8.3 mg/dL — ABNORMAL LOW (ref 8.9–10.3)
Chloride: 106 mmol/L (ref 101–111)
Creatinine, Ser: 0.6 mg/dL (ref 0.44–1.00)
GFR calc Af Amer: >60 mL/min (ref >60)
GFR calc non Af Amer: >60 mL/min (ref >60)
Glucose, Bld: 111 mg/dL — ABNORMAL HIGH (ref 65–99)
Potassium: 3.6 mmol/L (ref 3.5–5.1)
Sodium: 137 mmol/L (ref 135–145)
Total Bilirubin: 0.4 mg/dL (ref 0.3–1.2)
Total Protein: 5.1 g/dL — ABNORMAL LOW (ref 6.5–8.1)

## 2017-01-25 LAB — CBC WITH DIFFERENTIAL/PLATELET
Basophils Absolute: 0 10*3/uL (ref 0.0–0.1)
Basophils Relative: 0 %
Eosinophils Absolute: 0 10*3/uL (ref 0.0–0.7)
Eosinophils Relative: 1 %
HCT: 29.4 % — ABNORMAL LOW (ref 36.0–46.0)
Hemoglobin: 9.6 g/dL — ABNORMAL LOW (ref 12.0–15.0)
Lymphocytes Relative: 27 %
Lymphs Abs: 1.7 10*3/uL (ref 0.7–4.0)
MCH: 28.3 pg (ref 26.0–34.0)
MCHC: 32.7 g/dL (ref 30.0–36.0)
MCV: 86.7 fL (ref 78.0–100.0)
Monocytes Absolute: 0.2 10*3/uL (ref 0.1–1.0)
Monocytes Relative: 3 %
Neutro Abs: 4.4 10*3/uL (ref 1.7–7.7)
Neutrophils Relative %: 69 %
Platelets: 130 10*3/uL — ABNORMAL LOW (ref 150–400)
RBC: 3.39 MIL/uL — ABNORMAL LOW (ref 3.87–5.11)
RDW: 15.4 % (ref 11.5–15.5)
WBC: 6.3 10*3/uL (ref 4.0–10.5)

## 2017-01-25 NOTE — Progress Notes (Signed)
Patient ID: [name], female   DOB: 01/18/1986, 31 y.o.   MRN: [id number] [place]) NOTE  [name] is a 31 y.o. [id number] at [redacted]w[redacted]d by best clinical estimate who is admitted for elevated BP.   Fetal presentation is cephalic. Length of Stay:  1  Days  Subjective: Still with blurry vision, no headache or RUQ pain Wants regular diet Patient reports the fetal movement as active. Patient reports uterine contraction  activity as none. Patient reports  vaginal bleeding as none. Patient describes fluid per vagina as None.  Vitals:  Blood pressure 118/66, pulse 80, temperature 98.9 F (37.2 C), temperature source Oral, resp. rate 18, height 5\' 6"  (1.676 m), weight 207 lb (93.9 kg), SpO2 99 %, not currently breastfeeding. Physical Examination:  General appearance - alert, well appearing, and in no distress Chest - normal effort Abdomen - gravid, NT Fundal Height:  size equals dates Extremities: Homans sign is negative, no sign of DVT  Membranes:intact  Fetal Monitoring:  Baseline: 140 bpm, Variability: Good {> 6 bpm), Accelerations: Reactive and Decelerations: Absent  Labs:  Results for orders placed or performed during the hospital encounter of 01/24/17 (from the past 24 hour(s))  Urinalysis, Routine w reflex microscopic   Collection Time: 01/24/17  4:20 PM  Result Value Ref Range   Color, Urine YELLOW YELLOW   APPearance HAZY (A) CLEAR   Specific Gravity, Urine 1.025 1.005 - 1.030   pH 6.0 5.0 - 8.0   Glucose, UA NEGATIVE NEGATIVE mg/dL   Hgb urine dipstick NEGATIVE NEGATIVE   Bilirubin Urine NEGATIVE NEGATIVE   Ketones, ur NEGATIVE NEGATIVE mg/dL   Protein, ur 30 (A) NEGATIVE mg/dL   Nitrite NEGATIVE NEGATIVE   Leukocytes, UA SMALL (A) NEGATIVE   RBC / HPF 0-5 0 - 5 RBC/hpf   WBC, UA 0-5 0 - 5 WBC/hpf   Bacteria, UA RARE (A) NONE SEEN   Squamous Epithelial / LPF 6-30 (A) NONE SEEN   Mucous PRESENT   Protein / creatinine ratio,  urine   Collection Time: 01/24/17  4:20 PM  Result Value Ref Range   Creatinine, Urine 195.00 mg/dL   Total Protein, Urine 31 mg/dL   Protein Creatinine Ratio 0.16 (H) 0.00 - 0.15 mg/mg[Cre]  Urine rapid drug screen (hosp performed)not at [facility]   Collection Time: 01/24/17  4:20 PM  Result Value Ref Range   Opiates NONE DETECTED NONE DETECTED   Cocaine NONE DETECTED NONE DETECTED   Benzodiazepines NONE DETECTED NONE DETECTED   Amphetamines NONE DETECTED NONE DETECTED   Tetrahydrocannabinol NONE DETECTED NONE DETECTED   Barbiturates NONE DETECTED NONE DETECTED  Comprehensive metabolic panel   Collection Time: 01/24/17  5:48 PM  Result Value Ref Range   Sodium 139 135 - 145 mmol/L   Potassium 4.7 3.5 - 5.1 mmol/L   Chloride 107 101 - 111 mmol/L   CO2 26 22 - 32 mmol/L   Glucose, Bld 80 65 - 99 mg/dL   BUN 12 6 - 20 mg/dL   Creatinine, Ser 0.79 0.44 - 1.00 mg/dL   Calcium 8.8 (L) 8.9 - 10.3 mg/dL   Total Protein 6.9 6.5 - 8.1 g/dL   Albumin 3.2 (L) 3.5 - 5.0 g/dL   AST 15 15 - 41 U/L   ALT 12 (L) 14 - 54 U/L   Alkaline Phosphatase 100 38 - 126 U/L   Total Bilirubin 0.4 0.3 - 1.2 mg/dL   GFR calc non Af Amer >60 >60 mL/min  GFR calc Af Amer >60 >60 mL/min   Anion gap 6 5 - 15  CBC   Collection Time: 01/24/17  5:48 PM  Result Value Ref Range   WBC 9.6 4.0 - 10.5 K/uL   RBC 4.08 3.87 - 5.11 MIL/uL   Hemoglobin 11.7 (L) 12.0 - 15.0 g/dL   HCT 35.7 (L) 36.0 - 46.0 %   MCV 87.5 78.0 - 100.0 fL   MCH 28.7 26.0 - 34.0 pg   MCHC 32.8 30.0 - 36.0 g/dL   RDW 15.5 11.5 - 15.5 %   Platelets 159 150 - 400 K/uL  Type and screen [place]   Collection Time: 01/24/17  7:50 PM  Result Value Ref Range   ABO/RH(D) O POS    Antibody Screen NEG    Sample Expiration 01/27/2017   Glucose, capillary   Collection Time: 01/25/17  5:26 AM  Result Value Ref Range   Glucose-Capillary 90 65 - 99 mg/dL     Medications:  Scheduled . ARIPiprazole  15 mg Oral QHS  .  docusate sodium  100 mg Oral Daily  . prenatal multivitamin  1 tablet Oral Q1200   I have reviewed the patient's current medications.  ASSESSMENT: Principal Problem:   Preeclampsia, third trimester Less likely GDM but continue to check BS  PLAN: Continue inpatient management Complete 24 hour urine Repeat labs  Donnamae Jude, MD 01/25/2017,7:37 AM

## 2017-01-26 ENCOUNTER — Encounter (HOSPITAL_COMMUNITY): Payer: Self-pay | Admitting: *Deleted

## 2017-01-26 DIAGNOSIS — O133 Gestational [pregnancy-induced] hypertension without significant proteinuria, third trimester: Secondary | ICD-10-CM

## 2017-01-26 LAB — PROTEIN, URINE, 24 HOUR
Collection Interval-UPROT: 24 hours
Protein, 24H Urine: 285 mg/d — ABNORMAL HIGH (ref 50–100)
Protein, Urine: 10 mg/dL
Urine Total Volume-UPROT: 2850 mL
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
## 2017-01-26 NOTE — Discharge Summary (Signed)
Antenatal Physician Discharge Summary  Patient ID: [name] MRN: [id number] DOB/AGE: 14-May-1986 31 y.o.  Admit date: 01/24/2017 Discharge date: 01/26/2017  Admission Diagnoses: Principal Problem:   Gestational hypertension    Discharge Diagnoses: Same  Prenatal Procedures: NST and ultrasound  Consults: None  Hospital Course:  This is a 31 y.o. [id number] with IUP at [redacted]w[redacted]d admitted for elevated BP. She was presented with contractions, and found to have elevated BP. Observed with 24 hour urin completed and noted to have normal PIH labs. 24 hour urine with 285 mg protein.  She was observed, fetal heart rate monitoring remained reassuring, and she had blood pressures that normalized. There were no other maternal-fetal concerns. She was deemed stable for discharge to home with outpatient follow up.  Discharge Exam: Temp:  [98 F (36.7 C)-99.3 F (37.4 C)] 98 F (36.7 C) (02/12 0013) Pulse Rate:  [67-109] 75 (02/12 0013) Resp:  [16-20] 16 (02/12 0400) BP: (103-136)/(60-86) 116/69 (02/12 0013) SpO2:  [98 %-100 %] 99 % (02/12 0013) Physical Examination: CONSTITUTIONAL: Well-developed, well-nourished female in no acute distress.  HENT:  Normocephalic, atraumatic EYES: Conjunctivae and EOM are normal. No scleral icterus.  NECK: Normal range of motion, supple, no masses SKIN: Skin is warm and dry.  [place]: Alert and oriented to person, place, and time.  CARDIOVASCULAR: Normal heart rate noted, regular rhythm RESPIRATORY: Effort normal ABDOMEN: Soft, nontender, nondistended, gravid. CERVIX: Dilation: 2 Effacement (%): Thick Cervical Position: Posterior Presentation: Vertex Exam by:: Dr Vanetta Shawl  Fetal monitoring: FHR: 1140 bpm, Variability: moderate, Accelerations: Present, Decelerations: Absent  Uterine activity: irritability only  Significant Diagnostic Studies:  Results for orders placed or performed during the hospital encounter of 01/24/17 (from the past 168  hour(s))  Urinalysis, Routine w reflex microscopic   Collection Time: 01/24/17  4:20 PM  Result Value Ref Range   Color, Urine YELLOW YELLOW   APPearance HAZY (A) CLEAR   Specific Gravity, Urine 1.025 1.005 - 1.030   pH 6.0 5.0 - 8.0   Glucose, UA NEGATIVE NEGATIVE mg/dL   Hgb urine dipstick NEGATIVE NEGATIVE   Bilirubin Urine NEGATIVE NEGATIVE   Ketones, ur NEGATIVE NEGATIVE mg/dL   Protein, ur 30 (A) NEGATIVE mg/dL   Nitrite NEGATIVE NEGATIVE   Leukocytes, UA SMALL (A) NEGATIVE   RBC / HPF 0-5 0 - 5 RBC/hpf   WBC, UA 0-5 0 - 5 WBC/hpf   Bacteria, UA RARE (A) NONE SEEN   Squamous Epithelial / LPF 6-30 (A) NONE SEEN   Mucous PRESENT   Protein / creatinine ratio, urine   Collection Time: 01/24/17  4:20 PM  Result Value Ref Range   Creatinine, Urine 195.00 mg/dL   Total Protein, Urine 31 mg/dL   Protein Creatinine Ratio 0.16 (H) 0.00 - 0.15 mg/mg[Cre]  Urine rapid drug screen (hosp performed)not at [facility]   Collection Time: 01/24/17  4:20 PM  Result Value Ref Range   Opiates NONE DETECTED NONE DETECTED   Cocaine NONE DETECTED NONE DETECTED   Benzodiazepines NONE DETECTED NONE DETECTED   Amphetamines NONE DETECTED NONE DETECTED   Tetrahydrocannabinol NONE DETECTED NONE DETECTED   Barbiturates NONE DETECTED NONE DETECTED  Comprehensive metabolic panel   Collection Time: 01/24/17  5:48 PM  Result Value Ref Range   Sodium 139 135 - 145 mmol/L   Potassium 4.7 3.5 - 5.1 mmol/L   Chloride 107 101 - 111 mmol/L   CO2 26 22 - 32 mmol/L   Glucose, Bld 80 65 - 99 mg/dL   BUN  12 6 - 20 mg/dL   Creatinine, Ser 0.79 0.44 - 1.00 mg/dL   Calcium 8.8 (L) 8.9 - 10.3 mg/dL   Total Protein 6.9 6.5 - 8.1 g/dL   Albumin 3.2 (L) 3.5 - 5.0 g/dL   AST 15 15 - 41 U/L   ALT 12 (L) 14 - 54 U/L   Alkaline Phosphatase 100 38 - 126 U/L   Total Bilirubin 0.4 0.3 - 1.2 mg/dL   GFR calc non Af Amer >60 >60 mL/min   GFR calc Af Amer >60 >60 mL/min   Anion gap 6 5 - 15  CBC   Collection Time: 01/24/17   5:48 PM  Result Value Ref Range   WBC 9.6 4.0 - 10.5 K/uL   RBC 4.08 3.87 - 5.11 MIL/uL   Hemoglobin 11.7 (L) 12.0 - 15.0 g/dL   HCT 35.7 (L) 36.0 - 46.0 %   MCV 87.5 78.0 - 100.0 fL   MCH 28.7 26.0 - 34.0 pg   MCHC 32.8 30.0 - 36.0 g/dL   RDW 15.5 11.5 - 15.5 %   Platelets 159 150 - 400 K/uL  Type and screen [place]   Collection Time: 01/24/17  7:50 PM  Result Value Ref Range   ABO/RH(D) O POS    Antibody Screen NEG    Sample Expiration 01/27/2017   Protein, urine, 24 hour   Collection Time: 01/24/17 10:00 PM  Result Value Ref Range   Urine Total Volume-UPROT 2,850 mL   Collection Interval-UPROT 24 hours   Protein, Urine 10 mg/dL   Protein, 24H Urine 285 (H) 50 - 100 mg/day  Glucose, capillary   Collection Time: 01/25/17  5:26 AM  Result Value Ref Range   Glucose-Capillary 90 65 - 99 mg/dL  CBC with Differential/Platelet   Collection Time: 01/25/17  8:29 AM  Result Value Ref Range   WBC 6.3 4.0 - 10.5 K/uL   RBC 3.39 (L) 3.87 - 5.11 MIL/uL   Hemoglobin 9.6 (L) 12.0 - 15.0 g/dL   HCT 29.4 (L) 36.0 - 46.0 %   MCV 86.7 78.0 - 100.0 fL   MCH 28.3 26.0 - 34.0 pg   MCHC 32.7 30.0 - 36.0 g/dL   RDW 15.4 11.5 - 15.5 %   Platelets 130 (L) 150 - 400 K/uL   Neutrophils Relative % 69 %   Neutro Abs 4.4 1.7 - 7.7 K/uL   Lymphocytes Relative 27 %   Lymphs Abs 1.7 0.7 - 4.0 K/uL   Monocytes Relative 3 %   Monocytes Absolute 0.2 0.1 - 1.0 K/uL   Eosinophils Relative 1 %   Eosinophils Absolute 0.0 0.0 - 0.7 K/uL   Basophils Relative 0 %   Basophils Absolute 0.0 0.0 - 0.1 K/uL  Comprehensive metabolic panel   Collection Time: 01/25/17  8:29 AM  Result Value Ref Range   Sodium 137 135 - 145 mmol/L   Potassium 3.6 3.5 - 5.1 mmol/L   Chloride 106 101 - 111 mmol/L   CO2 24 22 - 32 mmol/L   Glucose, Bld 111 (H) 65 - 99 mg/dL   BUN 10 6 - 20 mg/dL   Creatinine, Ser 0.60 0.44 - 1.00 mg/dL   Calcium 8.3 (L) 8.9 - 10.3 mg/dL   Total Protein 5.1 (L) 6.5 - 8.1  g/dL   Albumin 2.3 (L) 3.5 - 5.0 g/dL   AST 14 (L) 15 - 41 U/L   ALT 9 (L) 14 - 54 U/L   Alkaline Phosphatase 81  38 - 126 U/L   Total Bilirubin 0.4 0.3 - 1.2 mg/dL   GFR calc non Af Amer >60 >60 mL/min   GFR calc Af Amer >60 >60 mL/min   Anion gap 7 5 - 15  Glucose, capillary   Collection Time: 01/25/17  8:42 AM  Result Value Ref Range   Glucose-Capillary 96 65 - 99 mg/dL  Glucose, capillary   Collection Time: 01/25/17 10:58 AM  Result Value Ref Range   Glucose-Capillary 82 65 - 99 mg/dL  Glucose, capillary   Collection Time: 01/25/17  8:00 PM  Result Value Ref Range   Glucose-Capillary 83 65 - 99 mg/dL  Results for orders placed or performed in visit on 01/21/17 (from the past 168 hour(s))  Glucose Tolerance, 2 Hours w/1 Hour   Collection Time: 01/21/17 10:50 AM  Result Value Ref Range   Glucose, Fasting 105 (H) 65 - 91 mg/dL   Glucose, 1 hour 252 (H) 65 - 179 mg/dL   Glucose, 2 hour 160 (H) 65 - 152 mg/dL  CBC   Collection Time: 01/21/17 10:50 AM  Result Value Ref Range   WBC 10.9 (H) 3.4 - 10.8 x10E3/uL   RBC 3.99 3.77 - 5.28 x10E6/uL   Hemoglobin 11.1 11.1 - 15.9 g/dL   Hematocrit 36.2 34.0 - 46.6 %   MCV 91 79 - 97 fL   MCH 27.8 26.6 - 33.0 pg   MCHC 30.7 (L) 31.5 - 35.7 g/dL   RDW 16.2 (H) 12.3 - 15.4 %   Platelets 165 150 - 379 x10E3/uL  HIV antibody   Collection Time: 01/21/17 10:50 AM  Result Value Ref Range   HIV Screen 4th Generation wRfx Non Reactive Non Reactive  RPR   Collection Time: 01/21/17 10:50 AM  Result Value Ref Range   RPR Ser Ql Non Reactive Non Reactive  Results for orders placed or performed during the hospital encounter of 01/19/17 (from the past 168 hour(s))  Urinalysis, Routine w reflex microscopic   Collection Time: 01/19/17  5:55 PM  Result Value Ref Range   Color, Urine YELLOW YELLOW   APPearance HAZY (A) CLEAR   Specific Gravity, Urine 1.031 (H) 1.005 - 1.030   pH 5.0 5.0 - 8.0   Glucose, UA NEGATIVE NEGATIVE mg/dL   Hgb urine  dipstick NEGATIVE NEGATIVE   Bilirubin Urine NEGATIVE NEGATIVE   Ketones, ur NEGATIVE NEGATIVE mg/dL   Protein, ur 100 (A) NEGATIVE mg/dL   Nitrite NEGATIVE NEGATIVE   Leukocytes, UA TRACE (A) NEGATIVE   RBC / HPF 0-5 0 - 5 RBC/hpf   WBC, UA 0-5 0 - 5 WBC/hpf   Bacteria, UA RARE (A) NONE SEEN   Squamous Epithelial / LPF 6-30 (A) NONE SEEN   Mucous PRESENT     Discharge Condition: Stable  Disposition: 01-Home or Self Care   Discharge Instructions    Discharge activity:  Up to eat    Complete by:  As directed    Discharge activity: Bedrest    Complete by:  As directed    Discharge diet:  No restrictions    Complete by:  As directed    Fetal Kick Count:  Lie on our left side for one hour after a meal, and count the number of times your baby kicks.  If it is less than 5 times, get up, move around and drink some juice.  Repeat the test 30 minutes later.  If it is still less than 5 kicks in an hour, notify your doctor.  Complete by:  As directed    LABOR:  When conractions begin, you should start to time them from the beginning of one contraction to the beginning  of the next.  When contractions are 5 - 10 minutes apart or less and have been regular for at least an hour, you should call your health care provider.    Complete by:  As directed    No sexual activity restrictions    Complete by:  As directed    Notify physician for bleeding from the vagina    Complete by:  As directed    Notify physician for blurring of vision or spots before the eyes    Complete by:  As directed    Notify physician for chills or fever    Complete by:  As directed    Notify physician for fainting spells, "black outs" or loss of consciousness    Complete by:  As directed    Notify physician for increase in vaginal discharge    Complete by:  As directed    Notify physician for leaking of fluid    Complete by:  As directed    Notify physician for pain or burning when urinating    Complete by:  As  directed    Notify physician for pelvic pressure (sudden increase)    Complete by:  As directed    Notify physician for severe or continued nausea or vomiting    Complete by:  As directed    Notify physician for sudden gushing of fluid from the vagina (with or without continued leaking)    Complete by:  As directed    Notify physician for sudden, constant, or occasional abdominal pain    Complete by:  As directed    Notify physician if baby moving less than usual    Complete by:  As directed      Allergies as of 01/26/2017      Reactions   Depakote [divalproex Sodium] Other (See Comments)   Pt states that this medication makes her blood levels toxic.     Methylphenidate Derivatives Other (See Comments)   Reaction:  Depression and anger    Neurontin [gabapentin] Other (See Comments)   Reaction:  Dizziness    Prozac [fluoxetine Hcl] Other (See Comments)   Reaction:  Anger       Medication List    TAKE these medications   ARIPiprazole 15 MG tablet Commonly known as:  ABILIFY Take 15 mg by mouth at bedtime.   prenatal multivitamin Tabs tablet Take 1 tablet by mouth at bedtime.      Follow-up Micro Follow up.   Specialty:  Obstetrics and Gynecology Why:  Keep next scheduled appointment, needs 2x/wk testing with BP check due to [facility], schedule IOL at 37 wks Contact information: [address] [place] [phone]          Signed: Donnamae Jude M.D. 01/26/2017, 7:41 AM

## 2017-01-26 NOTE — Discharge Instructions (Signed)
Preeclampsia and Eclampsia  Preeclampsia is a serious condition that develops only during pregnancy. It is also called toxemia of pregnancy. This condition causes high blood pressure along with other symptoms, such as swelling and headaches. These symptoms may develop as the condition gets worse. Preeclampsia may occur at 20 weeks of pregnancy or later.  Diagnosing and treating preeclampsia early is very important. If not treated early, it can cause serious problems for you and your baby. One problem it can lead to is eclampsia, which is a condition that causes muscle jerking or shaking (convulsions or seizures) in the mother. Delivering your baby is the best treatment for preeclampsia or eclampsia. Preeclampsia and eclampsia symptoms usually go away after your baby is born.  What are the causes?  The cause of preeclampsia is not known.  What increases the risk?  The following risk factors make you more likely to develop preeclampsia:  · Being pregnant for the first time.  · Having had preeclampsia during a past pregnancy.  · Having a family history of preeclampsia.  · Having high blood pressure.  · Being pregnant with twins or triplets.  · Being 35 or older.  · Being African-American.  · Having kidney disease or diabetes.  · Having medical conditions such as lupus or blood diseases.  · Being very overweight (obese).    What are the signs or symptoms?  The earliest signs of preeclampsia are:  · High blood pressure.  · Increased protein in your urine. Your health care provider will check for this at every visit before you give birth (prenatal visit).    Other symptoms that may develop as the condition gets worse include:  · Severe headaches.  · Sudden weight gain.  · Swelling of the hands, face, legs, and feet.  · Nausea and vomiting.  · Vision problems, such as blurred or double vision.  · Numbness in the face, arms, legs, and feet.  · Urinating less than usual.  · Dizziness.  · Slurred speech.  · Abdominal pain,  especially upper abdominal pain.  · Convulsions or seizures.    Symptoms generally go away after giving birth.  How is this diagnosed?  There are no screening tests for preeclampsia. Your health care provider will ask you about symptoms and check for signs of preeclampsia during your prenatal visits. You may also have tests that include:  · Urine tests.  · Blood tests.  · Checking your blood pressure.  · Monitoring your baby’s heart rate.  · Ultrasound.    How is this treated?  You and your health care provider will determine the treatment approach that is best for you. Treatment may include:  · Having more frequent prenatal exams to check for signs of preeclampsia, if you have an increased risk for preeclampsia.  · Bed rest.  · Reducing how much salt (sodium) you eat.  · Medicine to lower your blood pressure.  · Staying in the hospital, if your condition is severe. There, treatment will focus on controlling your blood pressure and the amount of fluids in your body (fluid retention).  · You may need to take medicine (magnesium sulfate) to prevent seizures. This medicine may be given as an injection or through an IV tube.  · Delivering your baby early, if your condition gets worse. You may have your labor started with medicine (induced), or you may have a cesarean delivery.    Follow these instructions at home:  Eating and drinking     ·   Drink enough fluid to keep your urine clear or pale yellow.  · Eat a healthy diet that is low in sodium. Do not add salt to your food. Check nutrition labels to see how much sodium a food or beverage contains.  · Avoid caffeine.  Lifestyle   · Do not use any products that contain nicotine or tobacco, such as cigarettes and e-cigarettes. If you need help quitting, ask your health care provider.  · Do not use alcohol or drugs.  · Avoid stress as much as possible. Rest and get plenty of sleep.  General instructions   · Take over-the-counter and prescription medicines only as told by  your health care provider.  · When lying down, lie on your side. This keeps pressure off of your baby.  · When sitting or lying down, raise (elevate) your feet. Try putting some pillows underneath your lower legs.  · Exercise regularly. Ask your health care provider what kinds of exercise are best for you.  · Keep all follow-up and prenatal visits as told by your health care provider. This is important.  How is this prevented?  To prevent preeclampsia or eclampsia from developing during another pregnancy:  · Get proper medical care during pregnancy. Your health care provider may be able to prevent preeclampsia or diagnose and treat it early.  · Your health care provider may have you take a low-dose aspirin or a calcium supplement during your next pregnancy.  · You may have tests of your blood pressure and kidney function after giving birth.  · Maintain a healthy weight. Ask your health care provider for help managing weight gain during pregnancy.  · Work with your health care provider to manage any long-term (chronic) health conditions you have, such as diabetes or kidney problems.    Contact a health care provider if:  · You gain more weight than expected.  · You have headaches.  · You have nausea or vomiting.  · You have abdominal pain.  · You feel dizzy or light-headed.  Get help right away if:  · You develop sudden or severe swelling anywhere in your body. This usually happens in the legs.  · You gain 5 lbs (2.3 kg) or more during one week.  · You have severe:  ? Abdominal pain.  ? Headaches.  ? Dizziness.  ? Vision problems.  ? Confusion.  ? Nausea or vomiting.  · You have a seizure.  · You have trouble moving any part of your body.  · You develop numbness in any part of your body.  · You have trouble speaking.  · You have any abnormal bleeding.  · You pass out.  This information is not intended to replace advice given to you by your health care provider. Make sure you discuss any questions you have with your  health care provider.  Document Released: 11/28/2000 Document Revised: 07/29/2016 Document Reviewed: 07/07/2016  Elsevier Interactive Patient Education © 2017 [employer]

## 2017-01-26 NOTE — Progress Notes (Signed)
Pt found a ride home from family. Discharge instructions reviewed.

## 2017-01-27 ENCOUNTER — Telehealth: Payer: Self-pay

## 2017-01-27 ENCOUNTER — Encounter: Payer: Medicare Other | Admitting: Certified Nurse Midwife

## 2017-01-27 ENCOUNTER — Encounter (HOSPITAL_COMMUNITY): Payer: Self-pay

## 2017-01-27 ENCOUNTER — Inpatient Hospital Stay (HOSPITAL_COMMUNITY)
Admission: AD | Admit: 2017-01-27 | Discharge: 2017-01-27 | Disposition: A | Discharge status: Home / Self Care | Payer: Medicare Other | Source: Ambulatory Visit | Attending: Obstetrics & Gynecology | Admitting: Obstetrics & Gynecology

## 2017-01-27 DIAGNOSIS — Z3689 Encounter for other specified antenatal screening: Secondary | ICD-10-CM

## 2017-01-27 DIAGNOSIS — Z3A34 34 weeks gestation of pregnancy: Secondary | ICD-10-CM | POA: Diagnosis not present

## 2017-01-27 DIAGNOSIS — O133 Gestational [pregnancy-induced] hypertension without significant proteinuria, third trimester: Secondary | ICD-10-CM | POA: Diagnosis not present

## 2017-01-27 DIAGNOSIS — R51 Headache: Secondary | ICD-10-CM | POA: Diagnosis present

## 2017-01-27 LAB — COMPREHENSIVE METABOLIC PANEL
ALT: 9 U/L — ABNORMAL LOW (ref 14–54)
AST: 15 U/L (ref 15–41)
Albumin: 3.1 g/dL — ABNORMAL LOW (ref 3.5–5.0)
Alkaline Phosphatase: 101 U/L (ref 38–126)
Anion gap: 9 (ref 5–15)
BUN: 12 mg/dL (ref 6–20)
CO2: 26 mmol/L (ref 22–32)
Calcium: 8.9 mg/dL (ref 8.9–10.3)
Chloride: 103 mmol/L (ref 101–111)
Creatinine, Ser: 0.75 mg/dL (ref 0.44–1.00)
GFR calc Af Amer: >60 mL/min (ref >60)
GFR calc non Af Amer: >60 mL/min (ref >60)
Glucose, Bld: 82 mg/dL (ref 65–99)
Potassium: 5.4 mmol/L — ABNORMAL HIGH (ref 3.5–5.1)
Sodium: 138 mmol/L (ref 135–145)
Total Bilirubin: 0.4 mg/dL (ref 0.3–1.2)
Total Protein: 6.8 g/dL (ref 6.5–8.1)

## 2017-01-27 LAB — CBC
HCT: 37.1 % (ref 36.0–46.0)
Hemoglobin: 12.2 g/dL (ref 12.0–15.0)
MCH: 28.7 pg (ref 26.0–34.0)
MCHC: 32.9 g/dL (ref 30.0–36.0)
MCV: 87.3 fL (ref 78.0–100.0)
Platelets: 193 10*3/uL (ref 150–400)
RBC: 4.25 MIL/uL (ref 3.87–5.11)
RDW: 15.6 % — ABNORMAL HIGH (ref 11.5–15.5)
WBC: 13.1 10*3/uL — ABNORMAL HIGH (ref 4.0–10.5)

## 2017-01-27 LAB — PROTEIN / CREATININE RATIO, URINE
Creatinine, Urine: 221 mg/dL
Protein Creatinine Ratio: 0.2 mg/mg{Cre} — ABNORMAL HIGH (ref 0.00–0.15)
Total Protein, Urine: 44 mg/dL

## 2017-01-27 MED ORDER — ACETAMINOPHEN 500 MG PO TABS
1000.0000 mg | ORAL_TABLET | Freq: Four times a day (QID) | ORAL | Status: DC | PRN
Start: 2017-01-27 — End: 2017-01-27
  Administered 2017-01-27: 1000 mg via ORAL
  Filled 2017-01-27: qty 2

## 2017-01-27 NOTE — Telephone Encounter (Signed)
Received email and voicemail from pt today states friend check her BP and she questioned were the BP readings elevated. Readings were elevated and we will recheck readings during her NST/ROB appt today.

## 2017-01-27 NOTE — Telephone Encounter (Signed)
Pt called again c/o visual disturbances, HA's and dizziness. Previous note stated pt called to give elevated BP readings done by a friend who is a Marine scientist. Consulted with provider. Pt has an appt today but she needs to report to [facility] for further testing. Pt agrees and has no further questions.

## 2017-01-27 NOTE — MAU Provider Note (Signed)
History     CSN: [id number]  Arrival date and time: 01/27/17 1508   First Provider Initiated Contact with Patient 01/27/17 1551      Chief Complaint  Patient presents with  . Headache   G4P1021 @34 .6 weeks here with HA, blurry vision, and dizziness. She rates pain 7/10. She has not used analgesics. She also reports SOB. Symptoms started 4 hrs ago. She reports good FM. No VB or LOF. She feels ctx about 3x per hr. Review of chart shows she was admitted for HTN,, HA, and blurry vision 3 days ago. PIH labs were normal, 24 hr showed 285 mg of protein, BPs stabilized, and she was discharged yesterday.     OB History    Gravida Para Term Preterm AB Living   4 1 1   2 1    SAB TAB Ectopic Multiple Live Births         1 1      Past Medical History:  Diagnosis Date  . Anxiety   . Asthma    inhaler PRN-hasn't used inhaler in months  . Attention deficit hyperactivity disorder   . Bipolar disorder [facility])    hospitalized as a teen for suicidal ideations and cutting  . Depression   . Gastritis   . GERD (gastroesophageal reflux disease)   . HPV (human papilloma virus) anogenital infection   . Hx of varicella   . Mild preeclampsia 02/09/2016  . Vaginal Pap smear, abnormal     Past Surgical History:  Procedure Laterality Date  . EYE MUSCLE SURGERY Bilateral    07/29/12  . MOUTH SURGERY    . PLANTAR FASCIA SURGERY    . WISDOM TOOTH EXTRACTION      Family History  Problem Relation Age of Onset  . Depression Mother   . Colon cancer Mother   . Colon polyps Mother   . Depression Father   . Depression Brother   . Liver disease    . Kidney disease      Social History  Substance Use Topics  . Smoking status: Never Smoker  . Smokeless tobacco: Never Used  . Alcohol use No    Allergies:  Allergies  Allergen Reactions  . Depakote [Divalproex Sodium] Other (See Comments)    Pt states that this medication makes her blood levels toxic.    [name] Methylphenidate Derivatives Other (See  Comments)    Reaction:  Depression and anger   . Neurontin [Gabapentin] Other (See Comments)    Reaction:  Dizziness   . Prozac [Fluoxetine Hcl] Other (See Comments)    Reaction:  Anger     Prescriptions Prior to Admission  Medication Sig Dispense Refill Last Dose  . ARIPiprazole (ABILIFY) 15 MG tablet Take 15 mg by mouth at bedtime.    01/23/2017 at Unknown time  . Prenatal Vit-Fe Fumarate-FA (PRENATAL MULTIVITAMIN) TABS tablet Take 1 tablet by mouth at bedtime.    01/23/2017 at Unknown time    Review of Systems  Eyes: Positive for visual disturbance.  Respiratory: Positive for shortness of breath.   Gastrointestinal: Negative for abdominal pain.  Genitourinary: Negative for vaginal bleeding and vaginal discharge.  Neurological: Positive for dizziness and headaches.   Physical Exam   Blood pressure 128/91, pulse 107, temperature 98.1 F (36.7 C), temperature source Oral, resp. rate 18, SpO2 97 %, not currently breastfeeding.  Patient Vitals for the past 24 hrs:  BP Temp Temp src Pulse Resp SpO2  01/27/17 1616 133/95 - - 106 - -  01/27/17 1601 122/86 - - 110 - -  01/27/17 1551 121/85 - - 102 - -  01/27/17 1546 117/90 - - 110 - -  01/27/17 1534 - - - 107 - 97 %  01/27/17 1533 - - - - - 97 %  01/27/17 1531 128/91 - - 109 - -  01/27/17 1528 139/97 98.1 F (36.7 C) Oral 114 18 -   Physical Exam  Constitutional: She is oriented to person, place, and time. She appears well-developed and well-nourished. No distress.  HENT:  Head: Normocephalic and atraumatic.  Neck: Normal range of motion.  Cardiovascular: Normal rate, regular rhythm and normal heart sounds.   Respiratory: Effort normal and breath sounds normal. No respiratory distress.  GI: Soft. She exhibits no distension. There is no tenderness.  gravid  Genitourinary:  Genitourinary Comments: SVE: 1.5/thick  Musculoskeletal: Normal range of motion. She exhibits no edema.  Neurological: She is alert and oriented to person,  place, and time.  Skin: Skin is warm and dry.  Psychiatric: She has a normal mood and affect.   EFM: 135 bpm, mod variability, + accels, no decels Toco: irritability  Results for orders placed or performed during the hospital encounter of 01/27/17 (from the past 24 hour(s))  Protein / creatinine ratio, urine     Status: Abnormal   Collection Time: 01/27/17  3:15 PM  Result Value Ref Range   Creatinine, Urine 221.00 mg/dL   Total Protein, Urine 44 mg/dL   Protein Creatinine Ratio 0.20 (H) 0.00 - 0.15 mg/mg[Cre]  CBC     Status: Abnormal   Collection Time: 01/27/17  3:24 PM  Result Value Ref Range   WBC 13.1 (H) 4.0 - 10.5 K/uL   RBC 4.25 3.87 - 5.11 MIL/uL   Hemoglobin 12.2 12.0 - 15.0 g/dL   HCT 37.1 36.0 - 46.0 %   MCV 87.3 78.0 - 100.0 fL   MCH 28.7 26.0 - 34.0 pg   MCHC 32.9 30.0 - 36.0 g/dL   RDW 15.6 (H) 11.5 - 15.5 %   Platelets 193 150 - 400 K/uL  Comprehensive metabolic panel     Status: Abnormal   Collection Time: 01/27/17  3:24 PM  Result Value Ref Range   Sodium 138 135 - 145 mmol/L   Potassium 5.4 (H) 3.5 - 5.1 mmol/L   Chloride 103 101 - 111 mmol/L   CO2 26 22 - 32 mmol/L   Glucose, Bld 82 65 - 99 mg/dL   BUN 12 6 - 20 mg/dL   Creatinine, Ser 0.75 0.44 - 1.00 mg/dL   Calcium 8.9 8.9 - 10.3 mg/dL   Total Protein 6.8 6.5 - 8.1 g/dL   Albumin 3.1 (L) 3.5 - 5.0 g/dL   AST 15 15 - 41 U/L   ALT 9 (L) 14 - 54 U/L   Alkaline Phosphatase 101 38 - 126 U/L   Total Bilirubin 0.4 0.3 - 1.2 mg/dL   GFR calc non Af Amer >60 >60 mL/min   GFR calc Af Amer >60 >60 mL/min   Anion gap 9 5 - 15   MAU Course  Procedures Tylenol 1 gm po  MDM Labs ordered and reviewed. Presentation and clinical findings discussed with Dr. Elly Modena, ok for discharge home.   Assessment and Plan   1. [redacted] weeks gestation of pregnancy   2. NST (non-stress test) reactive   3. Pregnancy-induced hypertension in third trimester    Discharge home Follow up at [facility] in 1-2 days Preeclampsia  precautions  Allergies as of 01/27/2017      Reactions   Depakote [divalproex Sodium] Other (See Comments)   Pt states that this medication makes her blood levels toxic.     Methylphenidate Derivatives Other (See Comments)   Reaction:  Depression and anger    Neurontin [gabapentin] Other (See Comments)   Reaction:  Dizziness    Prozac [fluoxetine Hcl] Other (See Comments)   Reaction:  Anger       Medication List    TAKE these medications   ARIPiprazole 15 MG tablet Commonly known as:  ABILIFY Take 15 mg by mouth at bedtime.   prenatal multivitamin Tabs tablet Take 1 tablet by mouth at bedtime.      Julianne Handler, CNM 01/27/2017, 3:53 PM

## 2017-01-27 NOTE — MAU Note (Signed)
Patient presents with c/o headache, blurred vision and dizziness for the past 4 hours. Patient denies any bleeding or LOF. Fetus active.

## 2017-01-27 NOTE — Discharge Instructions (Signed)
Hypertension During Pregnancy Hypertension is also called high blood pressure. High blood pressure means that the force of your blood moving in your body is too strong. When you are pregnant, this condition should be watched carefully. It can cause problems for you and your baby. Follow these instructions at home: Eating and drinking  Drink enough fluid to keep your pee (urine) clear or pale yellow.  Eat healthy foods that are low in salt (sodium). ? Do not add salt to your food. ? Check labels on foods and drinks to see much salt is in them. Look on the label where you see "Sodium." Lifestyle  Do not use any products that contain nicotine or tobacco, such as cigarettes and e-cigarettes. If you need help quitting, ask your doctor.  Do not use alcohol.  Avoid caffeine.  Avoid stress. Rest and get plenty of sleep. General instructions  Take over-the-counter and prescription medicines only as told by your doctor.  While lying down, lie on your left side. This keeps pressure off your baby.  While sitting or lying down, raise (elevate) your feet. Try putting some pillows under your lower legs.  Exercise regularly. Ask your doctor what kinds of exercise are best for you.  Keep all prenatal and follow-up visits as told by your doctor. This is important. Contact a doctor if:  You have symptoms that your doctor told you to watch for, such as: ? Fever. ? Throwing up (vomiting). ? Headache. Get help right away if:  You have very bad pain in your belly (abdomen).  You are throwing up, and this does not get better with treatment.  You suddenly get swelling in your hands, ankles, or face.  You gain 4 lb (1.8 kg) or more in 1 week.  You get bleeding from your vagina.  You have blood in your pee.  You do not feel your baby moving as much as normal.  You have a change in vision.  You have muscle twitching or sudden tightening (spasms).  You have trouble breathing.  Your lips  or fingernails turn blue. This information is not intended to replace advice given to you by your health care provider. Make sure you discuss any questions you have with your health care provider. Document Released: 01/03/2011 Document Revised: 08/12/2016 Document Reviewed: 08/12/2016 Elsevier Interactive Patient Education  2017 [employer]vier Inc.  

## 2017-01-28 ENCOUNTER — Encounter: Payer: Self-pay | Admitting: Obstetrics

## 2017-01-28 ENCOUNTER — Ambulatory Visit (INDEPENDENT_AMBULATORY_CARE_PROVIDER_SITE_OTHER): Payer: Medicare Other | Admitting: Obstetrics

## 2017-01-28 ENCOUNTER — Telehealth (HOSPITAL_COMMUNITY): Payer: Self-pay | Admitting: *Deleted

## 2017-01-28 ENCOUNTER — Encounter: Payer: Self-pay | Admitting: Certified Nurse Midwife

## 2017-01-28 VITALS — BP 122/86 | HR 91 | Wt 212.8 lb

## 2017-01-28 DIAGNOSIS — Z6839 Body mass index (BMI) 39.0-39.9, adult: Secondary | ICD-10-CM

## 2017-01-28 DIAGNOSIS — O133 Gestational [pregnancy-induced] hypertension without significant proteinuria, third trimester: Secondary | ICD-10-CM

## 2017-01-28 DIAGNOSIS — F79 Unspecified intellectual disabilities: Secondary | ICD-10-CM

## 2017-01-28 DIAGNOSIS — F3132 Bipolar disorder, current episode depressed, moderate: Secondary | ICD-10-CM

## 2017-01-28 DIAGNOSIS — E6609 Other obesity due to excess calories: Secondary | ICD-10-CM

## 2017-01-28 DIAGNOSIS — E66812 Obesity, class 2: Secondary | ICD-10-CM
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
## 2017-01-28 NOTE — Telephone Encounter (Signed)
Preadmission screen  

## 2017-01-28 NOTE — Progress Notes (Signed)
Patient is having contractions that are painful. They started in the waiting area. Patient feels dizzy. Patient reports a change in the color of her discharge.

## 2017-01-28 NOTE — Addendum Note (Signed)
Addended by: Valli Glance F on: 01/28/2017 03:38 PM   Modules accepted: Orders

## 2017-01-29 ENCOUNTER — Telehealth (HOSPITAL_COMMUNITY): Payer: Self-pay | Admitting: *Deleted

## 2017-01-29 ENCOUNTER — Encounter (HOSPITAL_COMMUNITY): Payer: Self-pay | Admitting: *Deleted

## 2017-01-29 NOTE — Telephone Encounter (Signed)
Preadmission screen  

## 2017-01-30 ENCOUNTER — Encounter (HOSPITAL_COMMUNITY): Payer: Self-pay

## 2017-01-30 ENCOUNTER — Inpatient Hospital Stay (EMERGENCY_DEPARTMENT_HOSPITAL)
Admission: AD | Admit: 2017-01-30 | Discharge: 2017-01-30 | Disposition: A | Discharge status: Home / Self Care | Payer: Medicare Other | Source: Ambulatory Visit | Attending: Family Medicine | Admitting: Family Medicine

## 2017-01-30 DIAGNOSIS — O1403 Mild to moderate pre-eclampsia, third trimester: Secondary | ICD-10-CM | POA: Diagnosis not present

## 2017-01-30 DIAGNOSIS — O4693 Antepartum hemorrhage, unspecified, third trimester: Secondary | ICD-10-CM | POA: Diagnosis not present

## 2017-01-30 DIAGNOSIS — O4703 False labor before 37 completed weeks of gestation, third trimester: Secondary | ICD-10-CM | POA: Diagnosis not present

## 2017-01-30 DIAGNOSIS — O368130 Decreased fetal movements, third trimester, not applicable or unspecified: Secondary | ICD-10-CM

## 2017-01-30 DIAGNOSIS — O26893 Other specified pregnancy related conditions, third trimester: Secondary | ICD-10-CM

## 2017-01-30 DIAGNOSIS — Z3A35 35 weeks gestation of pregnancy: Secondary | ICD-10-CM

## 2017-01-30 DIAGNOSIS — R109 Unspecified abdominal pain: Secondary | ICD-10-CM | POA: Diagnosis not present

## 2017-01-30 DIAGNOSIS — O479 False labor, unspecified: Secondary | ICD-10-CM | POA: Diagnosis not present

## 2017-01-30 LAB — URINALYSIS, ROUTINE W REFLEX MICROSCOPIC
Glucose, UA: NEGATIVE mg/dL
Hgb urine dipstick: NEGATIVE
Ketones, ur: NEGATIVE mg/dL
Nitrite: NEGATIVE
Protein, ur: 100 mg/dL — AB
Specific Gravity, Urine: 1.031 — ABNORMAL HIGH (ref 1.005–1.030)
pH: 5 (ref 5.0–8.0)

## 2017-01-30 LAB — STREP GP B NAA: Strep Gp B NAA: NEGATIVE

## 2017-01-30 NOTE — Discharge Instructions (Signed)
Braxton Hicks Contractions °Contractions of the uterus can occur throughout pregnancy. Contractions are not always a sign that you are in labor.  °WHAT ARE BRAXTON HICKS CONTRACTIONS?  °Contractions that occur before labor are called Braxton Hicks contractions, or false labor. Toward the end of pregnancy (32-34 weeks), these contractions can develop more often and may become more forceful. This is not true labor because these contractions do not result in opening (dilatation) and thinning of the cervix. They are sometimes difficult to tell apart from true labor because these contractions can be forceful and people have different pain tolerances. You should not feel embarrassed if you go to the hospital with false labor. Sometimes, the only way to tell if you are in true labor is for your health care provider to look for changes in the cervix. °If there are no prenatal problems or other health problems associated with the pregnancy, it is completely safe to be sent home with false labor and await the onset of true labor. °HOW CAN YOU TELL THE DIFFERENCE BETWEEN TRUE AND FALSE LABOR? °False Labor  °· The contractions of false labor are usually shorter and not as hard as those of true labor.   °· The contractions are usually irregular.   °· The contractions are often felt in the front of the lower abdomen and in the groin.   °· The contractions may go away when you walk around or change positions while lying down.   °· The contractions get weaker and are shorter lasting as time goes on.   °· The contractions do not usually become progressively stronger, regular, and closer together as with true labor.   °True Labor  °· Contractions in true labor last 30-70 seconds, become very regular, usually become more intense, and increase in frequency.   °· The contractions do not go away with walking.   °· The discomfort is usually felt in the top of the uterus and spreads to the lower abdomen and low back.   °· True labor can be  determined by your health care provider with an exam. This will show that the cervix is dilating and getting thinner.   °WHAT TO REMEMBER °· Keep up with your usual exercises and follow other instructions given by your health care provider.   °· Take medicines as directed by your health care provider.   °· Keep your regular prenatal appointments.   °· Eat and drink lightly if you think you are going into labor.   °· If Braxton Hicks contractions are making you uncomfortable:   °¨ Change your position from lying down or resting to walking, or from walking to resting.   °¨ Sit and rest in a tub of warm water.   °¨ Drink 2-3 glasses of water. Dehydration may cause these contractions.   °¨ Do slow and deep breathing several times an hour.   °WHEN SHOULD I SEEK IMMEDIATE MEDICAL CARE? °Seek immediate medical care if: °· Your contractions become stronger, more regular, and closer together.   °· You have fluid leaking or gushing from your vagina.   °· You have a fever.   °· You pass blood-tinged mucus.   °· You have vaginal bleeding.   °· You have continuous abdominal pain.   °· You have low back pain that you never had before.   °· You feel your baby's head pushing down and causing pelvic pressure.   °· Your baby is not moving as much as it used to.   °This information is not intended to replace advice given to you by your health care provider. Make sure you discuss any questions you have with your health care   provider. °Document Released: 12/01/2005 Document Revised: 03/24/2016 Document Reviewed: 09/12/2013 °Elsevier Interactive Patient Education © 2017 [employer] ° °

## 2017-01-30 NOTE — MAU Note (Signed)
Urine in lab 

## 2017-01-30 NOTE — MAU Provider Note (Signed)
CC:  Chief Complaint  Patient presents with  . Vaginal Bleeding  . Contractions     First Provider Initiated Contact with Patient 01/30/17 1403      HPI: [name] is a 31 y.o. year old G61P1021 female at [redacted]w[redacted]d weeks gestation who presents to MAU reporting ~ 4 contractions pr hour and seeing scant pink blood w/ wiping since last night. 1.5 cm on previous exam. Received BMZ 2/5 and 2/6.   Pt has been Dx'd w Pre-E w/out severe features and has been scheduled for IOL at 37 weeks.  Associated Sx:  Vaginal bleeding: scant Leaking of fluid: None Fetal movement: Decreased  O: Patient Vitals for the past 24 hrs:  BP Temp Temp src Pulse Resp SpO2 Height Weight  01/30/17 1342 131/89 98.8 F (37.1 C) Oral 99 18 98 % - -  01/30/17 1338 - - - - - - 5\' 6"  (1.676 m) 212 lb (96.2 kg)    General: NAD Heart: Regular rate Lungs: Normal rate and effort Abd: Soft, NT, Gravid, S=D Pelvic: NEFG, Neg pooling, Very faint tan discharge.  Dilation: 1.5 Effacement (%): Thick Station: -3 Presentation: Vertex Exam by:: [place] CNM  EFM: 135, reactive Toco: Contractions rare, mild  A: [redacted]w[redacted]d week IUP Braxton Hicks Scant bloody show. No evidence of abruption FHR reactive. Fetal mvmt palpated and heard on monitor. Pre-E w/ out severe features, stable  P: Discharge home in stable condition per consult w./ Dr. Kennon Rounds. Preterm labor precautions and fetal kick counts. Follow-up Information    [facility] Follow up on 02/02/2017.   Specialty:  Obstetrics and Gynecology Why:  call the office at 8:30 am to schedule monitoring of your baby. In-basket message sent to [employer] information: [address] Lynd Chadron Follow up.   Why:  as needed in emergencies Contact information: [address] [id number] [place]  [place], [place] 12/08/2016 11:40 PM  3

## 2017-01-30 NOTE — MAU Note (Signed)
Pt complains of contractions, unsure of how often, states she had some light bleeding when she wiped after using the bathroom. Also states she hasnt felt the baby move in two days.

## 2017-01-31 ENCOUNTER — Inpatient Hospital Stay (EMERGENCY_DEPARTMENT_HOSPITAL)
Admission: AD | Admit: 2017-01-31 | Discharge: 2017-01-31 | Disposition: A | Discharge status: Home / Self Care | Payer: Medicare Other | Source: Ambulatory Visit | Attending: Obstetrics and Gynecology | Admitting: Obstetrics and Gynecology

## 2017-01-31 ENCOUNTER — Encounter (HOSPITAL_COMMUNITY): Payer: Self-pay | Admitting: *Deleted

## 2017-01-31 DIAGNOSIS — Z3A35 35 weeks gestation of pregnancy: Secondary | ICD-10-CM

## 2017-01-31 DIAGNOSIS — Z9889 Other specified postprocedural states: Secondary | ICD-10-CM

## 2017-01-31 DIAGNOSIS — F319 Bipolar disorder, unspecified: Secondary | ICD-10-CM | POA: Insufficient documentation

## 2017-01-31 DIAGNOSIS — F419 Anxiety disorder, unspecified: Secondary | ICD-10-CM

## 2017-01-31 DIAGNOSIS — O99343 Other mental disorders complicating pregnancy, third trimester: Secondary | ICD-10-CM | POA: Insufficient documentation

## 2017-01-31 DIAGNOSIS — O479 False labor, unspecified: Secondary | ICD-10-CM | POA: Diagnosis not present

## 2017-01-31 DIAGNOSIS — O99513 Diseases of the respiratory system complicating pregnancy, third trimester: Secondary | ICD-10-CM | POA: Insufficient documentation

## 2017-01-31 DIAGNOSIS — Z800 Family history of malignant neoplasm of digestive organs: Secondary | ICD-10-CM

## 2017-01-31 DIAGNOSIS — K219 Gastro-esophageal reflux disease without esophagitis: Secondary | ICD-10-CM

## 2017-01-31 DIAGNOSIS — J45909 Unspecified asthma, uncomplicated: Secondary | ICD-10-CM

## 2017-01-31 DIAGNOSIS — O99613 Diseases of the digestive system complicating pregnancy, third trimester: Secondary | ICD-10-CM

## 2017-01-31 DIAGNOSIS — Z818 Family history of other mental and behavioral disorders: Secondary | ICD-10-CM

## 2017-01-31 DIAGNOSIS — Z8371 Family history of colonic polyps: Secondary | ICD-10-CM

## 2017-01-31 DIAGNOSIS — R1084 Generalized abdominal pain: Secondary | ICD-10-CM | POA: Diagnosis not present

## 2017-01-31 DIAGNOSIS — Z841 Family history of disorders of kidney and ureter: Secondary | ICD-10-CM

## 2017-01-31 LAB — PROTEIN / CREATININE RATIO, URINE
Creatinine, Urine: 266 mg/dL
Protein Creatinine Ratio: 0.27 mg/mg{Cre} — ABNORMAL HIGH (ref 0.00–0.15)
Total Protein, Urine: 71 mg/dL

## 2017-01-31 MED ORDER — NIFEDIPINE 10 MG PO CAPS: 10.0000 mg | ORAL_CAPSULE | Freq: Once | ORAL | Status: DC

## 2017-01-31 NOTE — Discharge Instructions (Signed)
Braxton Hicks Contractions °Contractions of the uterus can occur throughout pregnancy. Contractions are not always a sign that you are in labor.  °WHAT ARE BRAXTON HICKS CONTRACTIONS?  °Contractions that occur before labor are called Braxton Hicks contractions, or false labor. Toward the end of pregnancy (32-34 weeks), these contractions can develop more often and may become more forceful. This is not true labor because these contractions do not result in opening (dilatation) and thinning of the cervix. They are sometimes difficult to tell apart from true labor because these contractions can be forceful and people have different pain tolerances. You should not feel embarrassed if you go to the hospital with false labor. Sometimes, the only way to tell if you are in true labor is for your health care provider to look for changes in the cervix. °If there are no prenatal problems or other health problems associated with the pregnancy, it is completely safe to be sent home with false labor and await the onset of true labor. °HOW CAN YOU TELL THE DIFFERENCE BETWEEN TRUE AND FALSE LABOR? °False Labor  °· The contractions of false labor are usually shorter and not as hard as those of true labor.   °· The contractions are usually irregular.   °· The contractions are often felt in the front of the lower abdomen and in the groin.   °· The contractions may go away when you walk around or change positions while lying down.   °· The contractions get weaker and are shorter lasting as time goes on.   °· The contractions do not usually become progressively stronger, regular, and closer together as with true labor.   °True Labor  °· Contractions in true labor last 30-70 seconds, become very regular, usually become more intense, and increase in frequency.   °· The contractions do not go away with walking.   °· The discomfort is usually felt in the top of the uterus and spreads to the lower abdomen and low back.   °· True labor can be  determined by your health care provider with an exam. This will show that the cervix is dilating and getting thinner.   °WHAT TO REMEMBER °· Keep up with your usual exercises and follow other instructions given by your health care provider.   °· Take medicines as directed by your health care provider.   °· Keep your regular prenatal appointments.   °· Eat and drink lightly if you think you are going into labor.   °· If Braxton Hicks contractions are making you uncomfortable:   °¨ Change your position from lying down or resting to walking, or from walking to resting.   °¨ Sit and rest in a tub of warm water.   °¨ Drink 2-3 glasses of water. Dehydration may cause these contractions.   °¨ Do slow and deep breathing several times an hour.   °WHEN SHOULD I SEEK IMMEDIATE MEDICAL CARE? °Seek immediate medical care if: °· Your contractions become stronger, more regular, and closer together.   °· You have fluid leaking or gushing from your vagina.   °· You have a fever.   °· You pass blood-tinged mucus.   °· You have vaginal bleeding.   °· You have continuous abdominal pain.   °· You have low back pain that you never had before.   °· You feel your baby's head pushing down and causing pelvic pressure.   °· Your baby is not moving as much as it used to.   °This information is not intended to replace advice given to you by your health care provider. Make sure you discuss any questions you have with your health care   provider. °Document Released: 12/01/2005 Document Revised: 03/24/2016 Document Reviewed: 09/12/2013 °Elsevier Interactive Patient Education © 2017 [employer] ° °

## 2017-01-31 NOTE — MAU Note (Signed)
Vaginal pressure since about noon. Contractions on and off.  Denies LOF, vaginal bleeding.

## 2017-01-31 NOTE — MAU Provider Note (Signed)
History   G4P1021 @ 35.3 wks in with c/o contractions since noon and pelvic pressure. Denies vag bleeding or ROM  CSN: [id number]  Arrival date & time 01/31/17  1329   None     Chief Complaint  Patient presents with  . vaginal pressure  . Contractions    HPI  Past Medical History:  Diagnosis Date  . Anxiety   . Asthma    inhaler PRN-hasn't used inhaler in months  . Attention deficit hyperactivity disorder   . Bipolar disorder [facility])    hospitalized as a teen for suicidal ideations and cutting  . Depression   . Gastritis   . GERD (gastroesophageal reflux disease)   . HPV (human papilloma virus) anogenital infection   . Hx of varicella   . Mild preeclampsia 02/09/2016  . Vaginal Pap smear, abnormal     Past Surgical History:  Procedure Laterality Date  . EYE MUSCLE SURGERY Bilateral    07/29/12  . MOUTH SURGERY    . PLANTAR FASCIA SURGERY    . WISDOM TOOTH EXTRACTION      Family History  Problem Relation Age of Onset  . Depression Mother   . Colon cancer Mother   . Colon polyps Mother   . Depression Father   . Depression Brother   . Liver disease    . Kidney disease      Social History  Substance Use Topics  . Smoking status: Never Smoker  . Smokeless tobacco: Never Used  . Alcohol use No    OB History    Gravida Para Term Preterm AB Living   4 1 1   2 1    SAB TAB Ectopic Multiple Live Births         1 1      Review of Systems  Constitutional: Negative.   HENT: Negative.   Eyes: Negative.   Respiratory: Negative.   Cardiovascular: Negative.   Gastrointestinal: Positive for abdominal pain.  Endocrine: Negative.   Genitourinary: Negative.   Musculoskeletal: Negative.   Skin: Negative.     Allergies  Depakote [divalproex sodium]; Methylphenidate derivatives; Neurontin [gabapentin]; and Prozac [fluoxetine hcl]  Home Medications    BP 118/98   Pulse 104   Resp 18   LMP  (LMP Unknown)   Physical Exam  Constitutional: She is oriented to  person, place, and time. She appears well-developed and well-nourished.  HENT:  Head: Normocephalic.  Eyes: Pupils are equal, round, and reactive to light.  Neck: Normal range of motion.  Cardiovascular: Normal rate, regular rhythm, normal heart sounds and intact distal pulses.   Pulmonary/Chest: Effort normal and breath sounds normal.  Abdominal: Soft. Bowel sounds are normal.  Genitourinary: Vagina normal and uterus normal.  Musculoskeletal: Normal range of motion.  Neurological: She is alert and oriented to person, place, and time. She has normal reflexes.  Skin: Skin is warm and dry.  Psychiatric: She has a normal mood and affect. Her behavior is normal. Judgment and thought content normal.    MAU Course  Procedures (including critical care time)  Labs Reviewed  PROTEIN / CREATININE RATIO, URINE   No results found.   1. Labor, false (Braxton-Hicks)       MDM  FHR pattern reassuring.  Mild occasional uc's. VSS. SVE 2/60/-3 will d/c home

## 2017-01-31 NOTE — Progress Notes (Signed)
Hansel Feinstein, CNm notified of pts presenting complaints and bp reading.  Will put orders for care in

## 2017-02-01 ENCOUNTER — Encounter: Payer: Self-pay | Admitting: *Deleted

## 2017-02-01 ENCOUNTER — Encounter (HOSPITAL_COMMUNITY): Payer: Self-pay | Admitting: *Deleted

## 2017-02-01 ENCOUNTER — Inpatient Hospital Stay (EMERGENCY_DEPARTMENT_HOSPITAL)
Admission: AD | Admit: 2017-02-01 | Discharge: 2017-02-01 | Disposition: A | Discharge status: Home / Self Care | Payer: Medicare Other | Source: Ambulatory Visit | Attending: Obstetrics & Gynecology | Admitting: Obstetrics & Gynecology

## 2017-02-01 DIAGNOSIS — Z3483 Encounter for supervision of other normal pregnancy, third trimester: Secondary | ICD-10-CM

## 2017-02-01 DIAGNOSIS — O4703 False labor before 37 completed weeks of gestation, third trimester: Secondary | ICD-10-CM | POA: Diagnosis not present

## 2017-02-01 DIAGNOSIS — Z3A35 35 weeks gestation of pregnancy: Secondary | ICD-10-CM

## 2017-02-01 NOTE — MAU Provider Note (Signed)
History   [id number] @ 35.4 wks in with c/o contractions since last night.  Denies vag bleeding or ROM.  CSN: [id number]  Arrival date & time 02/01/17  1620   None     Chief Complaint  Patient presents with  . Contractions    HPI  Past Medical History:  Diagnosis Date  . Anxiety   . Asthma    inhaler PRN-hasn't used inhaler in months  . Attention deficit hyperactivity disorder   . Bipolar disorder [facility])    hospitalized as a teen for suicidal ideations and cutting  . Depression   . Gastritis   . GERD (gastroesophageal reflux disease)   . HPV (human papilloma virus) anogenital infection   . Hx of varicella   . Mild preeclampsia 02/09/2016  . Vaginal Pap smear, abnormal     Past Surgical History:  Procedure Laterality Date  . EYE MUSCLE SURGERY Bilateral    07/29/12  . MOUTH SURGERY    . PLANTAR FASCIA SURGERY    . WISDOM TOOTH EXTRACTION      Family History  Problem Relation Age of Onset  . Depression Mother   . Colon cancer Mother   . Colon polyps Mother   . Depression Father   . Depression Brother   . Liver disease    . Kidney disease      Social History  Substance Use Topics  . Smoking status: Never Smoker  . Smokeless tobacco: Never Used  . Alcohol use No    OB History    Gravida Para Term Preterm AB Living   4 1 1   2 1    SAB TAB Ectopic Multiple Live Births         1 1      Review of Systems  Constitutional: Negative.   HENT: Negative.   Eyes: Negative.   Respiratory: Negative.   Cardiovascular: Negative.   Gastrointestinal: Negative.   Endocrine: Negative.   Genitourinary: Negative.   Musculoskeletal: Negative.   Skin: Negative.     Allergies  Depakote [divalproex sodium]; Methylphenidate derivatives; Neurontin [gabapentin]; and Prozac [fluoxetine hcl]  Home Medications    LMP  (LMP Unknown)   Physical Exam  Constitutional: She is oriented to person, place, and time. She appears well-developed and well-nourished.  HENT:   Head: Normocephalic.  Eyes: Pupils are equal, round, and reactive to light.  Neck: Normal range of motion.  Cardiovascular: Normal rate, regular rhythm, normal heart sounds and intact distal pulses.   Pulmonary/Chest: Effort normal and breath sounds normal.  Abdominal: Soft. Bowel sounds are normal.  Genitourinary: Vagina normal and uterus normal.  Musculoskeletal: Normal range of motion.  Neurological: She is alert and oriented to person, place, and time. She has normal reflexes.  Skin: Skin is warm and dry.  Psychiatric: She has a normal mood and affect. Her behavior is normal. Judgment and thought content normal.    MAU Course  Procedures (including critical care time)  Labs Reviewed - No data to display No results found.   No diagnosis found.    MDM  FHR pattern reassuring. SVE 2/60/blts. VSS will d/c home with reactive FHR pattern.

## 2017-02-01 NOTE — Discharge Instructions (Signed)
Braxton Hicks Contractions °Contractions of the uterus can occur throughout pregnancy. Contractions are not always a sign that you are in labor.  °WHAT ARE BRAXTON HICKS CONTRACTIONS?  °Contractions that occur before labor are called Braxton Hicks contractions, or false labor. Toward the end of pregnancy (32-34 weeks), these contractions can develop more often and may become more forceful. This is not true labor because these contractions do not result in opening (dilatation) and thinning of the cervix. They are sometimes difficult to tell apart from true labor because these contractions can be forceful and people have different pain tolerances. You should not feel embarrassed if you go to the hospital with false labor. Sometimes, the only way to tell if you are in true labor is for your health care provider to look for changes in the cervix. °If there are no prenatal problems or other health problems associated with the pregnancy, it is completely safe to be sent home with false labor and await the onset of true labor. °HOW CAN YOU TELL THE DIFFERENCE BETWEEN TRUE AND FALSE LABOR? °False Labor  °· The contractions of false labor are usually shorter and not as hard as those of true labor.   °· The contractions are usually irregular.   °· The contractions are often felt in the front of the lower abdomen and in the groin.   °· The contractions may go away when you walk around or change positions while lying down.   °· The contractions get weaker and are shorter lasting as time goes on.   °· The contractions do not usually become progressively stronger, regular, and closer together as with true labor.   °True Labor  °· Contractions in true labor last 30-70 seconds, become very regular, usually become more intense, and increase in frequency.   °· The contractions do not go away with walking.   °· The discomfort is usually felt in the top of the uterus and spreads to the lower abdomen and low back.   °· True labor can be  determined by your health care provider with an exam. This will show that the cervix is dilating and getting thinner.   °WHAT TO REMEMBER °· Keep up with your usual exercises and follow other instructions given by your health care provider.   °· Take medicines as directed by your health care provider.   °· Keep your regular prenatal appointments.   °· Eat and drink lightly if you think you are going into labor.   °· If Braxton Hicks contractions are making you uncomfortable:   °¨ Change your position from lying down or resting to walking, or from walking to resting.   °¨ Sit and rest in a tub of warm water.   °¨ Drink 2-3 glasses of water. Dehydration may cause these contractions.   °¨ Do slow and deep breathing several times an hour.   °WHEN SHOULD I SEEK IMMEDIATE MEDICAL CARE? °Seek immediate medical care if: °· Your contractions become stronger, more regular, and closer together.   °· You have fluid leaking or gushing from your vagina.   °· You have a fever.   °· You pass blood-tinged mucus.   °· You have vaginal bleeding.   °· You have continuous abdominal pain.   °· You have low back pain that you never had before.   °· You feel your baby's head pushing down and causing pelvic pressure.   °· Your baby is not moving as much as it used to.   °This information is not intended to replace advice given to you by your health care provider. Make sure you discuss any questions you have with your health care   provider. °Document Released: 12/01/2005 Document Revised: 03/24/2016 Document Reviewed: 09/12/2013 °Elsevier Interactive Patient Education © 2017 [employer] ° °

## 2017-02-02 ENCOUNTER — Ambulatory Visit (INDEPENDENT_AMBULATORY_CARE_PROVIDER_SITE_OTHER): Payer: Medicare Other | Admitting: Obstetrics and Gynecology

## 2017-02-02 ENCOUNTER — Ambulatory Visit (HOSPITAL_COMMUNITY)
Admission: RE | Admit: 2017-02-02 | Discharge: 2017-02-02 | Disposition: A | Discharge status: Home / Self Care | Payer: Medicare Other | Source: Ambulatory Visit | Attending: Obstetrics and Gynecology | Admitting: Obstetrics and Gynecology

## 2017-02-02 ENCOUNTER — Encounter (HOSPITAL_COMMUNITY): Payer: Self-pay

## 2017-02-02 ENCOUNTER — Encounter (HOSPITAL_COMMUNITY): Payer: Self-pay | Admitting: *Deleted

## 2017-02-02 ENCOUNTER — Inpatient Hospital Stay (HOSPITAL_COMMUNITY)
Admission: AD | Admit: 2017-02-02 | Discharge: 2017-02-05 | DRG: 774 | Disposition: A | Discharge status: Home / Self Care | Payer: Medicare Other | Source: Ambulatory Visit | Attending: Family Medicine | Admitting: Family Medicine

## 2017-02-02 ENCOUNTER — Other Ambulatory Visit: Payer: Self-pay | Admitting: Obstetrics and Gynecology

## 2017-02-02 DIAGNOSIS — Z3493 Encounter for supervision of normal pregnancy, unspecified, third trimester: Secondary | ICD-10-CM

## 2017-02-02 DIAGNOSIS — G4733 Obstructive sleep apnea (adult) (pediatric): Secondary | ICD-10-CM | POA: Diagnosis not present

## 2017-02-02 DIAGNOSIS — J45909 Unspecified asthma, uncomplicated: Secondary | ICD-10-CM | POA: Diagnosis not present

## 2017-02-02 DIAGNOSIS — F79 Unspecified intellectual disabilities: Secondary | ICD-10-CM | POA: Diagnosis present

## 2017-02-02 DIAGNOSIS — Z6835 Body mass index (BMI) 35.0-35.9, adult: Secondary | ICD-10-CM

## 2017-02-02 DIAGNOSIS — O135 Gestational [pregnancy-induced] hypertension without significant proteinuria, complicating the puerperium: Secondary | ICD-10-CM | POA: Diagnosis not present

## 2017-02-02 DIAGNOSIS — O99343 Other mental disorders complicating pregnancy, third trimester: Secondary | ICD-10-CM

## 2017-02-02 DIAGNOSIS — O99344 Other mental disorders complicating childbirth: Secondary | ICD-10-CM | POA: Diagnosis present

## 2017-02-02 DIAGNOSIS — O09293 Supervision of pregnancy with other poor reproductive or obstetric history, third trimester: Secondary | ICD-10-CM | POA: Insufficient documentation

## 2017-02-02 DIAGNOSIS — O9832 Other infections with a predominantly sexual mode of transmission complicating childbirth: Secondary | ICD-10-CM | POA: Diagnosis present

## 2017-02-02 DIAGNOSIS — O09299 Supervision of pregnancy with other poor reproductive or obstetric history, unspecified trimester: Secondary | ICD-10-CM

## 2017-02-02 DIAGNOSIS — O9081 Anemia of the puerperium: Secondary | ICD-10-CM | POA: Diagnosis present

## 2017-02-02 DIAGNOSIS — O134 Gestational [pregnancy-induced] hypertension without significant proteinuria, complicating childbirth: Principal | ICD-10-CM | POA: Diagnosis present

## 2017-02-02 DIAGNOSIS — O9952 Diseases of the respiratory system complicating childbirth: Secondary | ICD-10-CM | POA: Diagnosis present

## 2017-02-02 DIAGNOSIS — O9962 Diseases of the digestive system complicating childbirth: Secondary | ICD-10-CM | POA: Diagnosis present

## 2017-02-02 DIAGNOSIS — O1493 Unspecified pre-eclampsia, third trimester: Secondary | ICD-10-CM

## 2017-02-02 DIAGNOSIS — O99213 Obesity complicating pregnancy, third trimester: Secondary | ICD-10-CM | POA: Insufficient documentation

## 2017-02-02 DIAGNOSIS — J101 Influenza due to other identified influenza virus with other respiratory manifestations: Secondary | ICD-10-CM | POA: Diagnosis present

## 2017-02-02 DIAGNOSIS — K219 Gastro-esophageal reflux disease without esophagitis: Secondary | ICD-10-CM | POA: Diagnosis present

## 2017-02-02 DIAGNOSIS — Z3A35 35 weeks gestation of pregnancy: Secondary | ICD-10-CM | POA: Insufficient documentation

## 2017-02-02 DIAGNOSIS — Z3490 Encounter for supervision of normal pregnancy, unspecified, unspecified trimester: Secondary | ICD-10-CM

## 2017-02-02 DIAGNOSIS — F319 Bipolar disorder, unspecified: Secondary | ICD-10-CM

## 2017-02-02 DIAGNOSIS — D649 Anemia, unspecified: Secondary | ICD-10-CM | POA: Diagnosis not present

## 2017-02-02 DIAGNOSIS — O99214 Obesity complicating childbirth: Secondary | ICD-10-CM | POA: Diagnosis present

## 2017-02-02 DIAGNOSIS — A630 Anogenital (venereal) warts: Secondary | ICD-10-CM | POA: Diagnosis present

## 2017-02-02 DIAGNOSIS — O288 Other abnormal findings on antenatal screening of mother: Secondary | ICD-10-CM

## 2017-02-02 DIAGNOSIS — O283 Abnormal ultrasonic finding on antenatal screening of mother: Secondary | ICD-10-CM

## 2017-02-02 DIAGNOSIS — O4703 False labor before 37 completed weeks of gestation, third trimester: Secondary | ICD-10-CM | POA: Diagnosis not present

## 2017-02-02 LAB — CBC WITH DIFFERENTIAL/PLATELET
Basophils Absolute: 0 10*3/uL (ref 0.0–0.1)
Basophils Relative: 0 %
Eosinophils Absolute: 0 10*3/uL (ref 0.0–0.7)
Eosinophils Relative: 0 %
HCT: 32.9 % — ABNORMAL LOW (ref 36.0–46.0)
Hemoglobin: 10.7 g/dL — ABNORMAL LOW (ref 12.0–15.0)
Lymphocytes Relative: 7 %
Lymphs Abs: 0.5 10*3/uL — ABNORMAL LOW (ref 0.7–4.0)
MCH: 28.1 pg (ref 26.0–34.0)
MCHC: 32.5 g/dL (ref 30.0–36.0)
MCV: 86.4 fL (ref 78.0–100.0)
Monocytes Absolute: 0.3 10*3/uL (ref 0.1–1.0)
Monocytes Relative: 4 %
Neutro Abs: 6.5 10*3/uL (ref 1.7–7.7)
Neutrophils Relative %: 89 %
Platelets: 131 10*3/uL — ABNORMAL LOW (ref 150–400)
RBC: 3.81 MIL/uL — ABNORMAL LOW (ref 3.87–5.11)
RDW: 16.1 % — ABNORMAL HIGH (ref 11.5–15.5)
WBC: 7.3 10*3/uL (ref 4.0–10.5)

## 2017-02-02 LAB — TYPE AND SCREEN
ABO/RH(D): O POS
Antibody Screen: NEGATIVE

## 2017-02-02 LAB — POCT FERN TEST: POCT Fern Test: NEGATIVE

## 2017-02-02 MED ORDER — ONDANSETRON HCL 4 MG/2ML IJ SOLN: 4.0000 mg | Freq: Four times a day (QID) | INTRAMUSCULAR | Status: DC | PRN

## 2017-02-02 MED ORDER — OXYCODONE-ACETAMINOPHEN 5-325 MG PO TABS
2.0000 | ORAL_TABLET | ORAL | Status: DC | PRN
Start: 2017-02-02 — End: 2017-02-03

## 2017-02-02 MED ORDER — SOD CITRATE-CITRIC ACID 500-334 MG/5ML PO SOLN: 30.0000 mL | ORAL | Status: DC | PRN

## 2017-02-02 MED ORDER — LACTATED RINGERS IV SOLN
INTRAVENOUS | Status: DC
  Administered 2017-02-03: 08:00:00 via INTRAVENOUS

## 2017-02-02 MED ORDER — LIDOCAINE HCL (PF) 1 % IJ SOLN
30.0000 mL | INTRAMUSCULAR | Status: DC | PRN
  Filled 2017-02-02: qty 30

## 2017-02-02 MED ORDER — ARIPIPRAZOLE 15 MG PO TABS
15.0000 mg | ORAL_TABLET | Freq: Every day | ORAL | Status: DC
  Administered 2017-02-03 – 2017-02-04 (×2): 15 mg via ORAL
  Filled 2017-02-02 (×5): qty 1

## 2017-02-02 MED ORDER — OXYTOCIN 40 UNITS IN LACTATED RINGERS INFUSION - SIMPLE MED
2.5000 [IU]/h | INTRAVENOUS | Status: DC
  Filled 2017-02-02: qty 1000

## 2017-02-02 MED ORDER — LACTATED RINGERS IV BOLUS (SEPSIS)
1000.0000 mL | Freq: Once | INTRAVENOUS | Status: AC
  Administered 2017-02-02: 1000 mL via INTRAVENOUS

## 2017-02-02 MED ORDER — LACTATED RINGERS IV SOLN: 500.0000 mL | INTRAVENOUS | Status: DC | PRN

## 2017-02-02 MED ORDER — OXYCODONE-ACETAMINOPHEN 5-325 MG PO TABS: 1.0000 | ORAL_TABLET | ORAL | Status: DC | PRN

## 2017-02-02 MED ORDER — ACETAMINOPHEN 325 MG PO TABS: 650.0000 mg | ORAL_TABLET | ORAL | Status: DC | PRN

## 2017-02-02 MED ORDER — FENTANYL CITRATE (PF) 100 MCG/2ML IJ SOLN
100.0000 ug | INTRAMUSCULAR | Status: DC | PRN
  Administered 2017-02-02 – 2017-02-03 (×4): 100 ug via INTRAVENOUS
  Filled 2017-02-02 (×4): qty 2

## 2017-02-02 MED ORDER — OXYTOCIN BOLUS FROM INFUSION
500.0000 mL | Freq: Once | INTRAVENOUS | Status: AC
  Administered 2017-02-03: 500 mL via INTRAVENOUS

## 2017-02-02 NOTE — Progress Notes (Addendum)
Patient here for NST and reports decreased fetal movement.  NST non reactive with baseline 150, mod variability, no accels, no decels Toco: no contractions  Patient sent for BPP. Attending on call notified

## 2017-02-02 NOTE — MAU Note (Signed)
Pt sent from U/S for non reactive NST. PT denies any VB or abnormal discharge. States she has not eat anything today, slept until time for her appointment.

## 2017-02-02 NOTE — H&P (Signed)
[name] is a 31 y.o. female [phone] at [redacted]w[redacted]d presenting for further monitoring following a 6/8 BPP. While being monitored patient complained of worsening pelvic pressure and possible rupture of membrane. She was ruled out for rupture by [place]. Patient with prenatal care at [facility] complicated by gestational HTN and mental retardation. Over the course of her MAU stay, patient made cervical change from 3/90 to 4/70 with bulging membrane.  OB History    Gravida Para Term Preterm AB Living   4 1 1   2 1    SAB TAB Ectopic Multiple Live Births         1 1     Past Medical History:  Diagnosis Date  . Anxiety   . Asthma    inhaler PRN-hasn't used inhaler in months  . Attention deficit hyperactivity disorder   . Bipolar disorder [facility])    hospitalized as a teen for suicidal ideations and cutting  . Depression   . Gastritis   . GERD (gastroesophageal reflux disease)   . HPV (human papilloma virus) anogenital infection   . Hx of varicella   . Mild preeclampsia 02/09/2016  . Vaginal Pap smear, abnormal    Past Surgical History:  Procedure Laterality Date  . EYE MUSCLE SURGERY Bilateral    07/29/12  . MOUTH SURGERY    . PLANTAR FASCIA SURGERY    . WISDOM TOOTH EXTRACTION     Family History: family history includes Colon cancer in her mother; Colon polyps in her mother; Depression in her brother, father, and mother. Social History:  reports that she has never smoked. She has never used smokeless tobacco. She reports that she does not drink alcohol or use drugs.     Maternal Diabetes: No Genetic Screening: Normal Maternal Ultrasounds/Referrals: Normal Fetal Ultrasounds or other Referrals:  None Maternal Substance Abuse:  No Significant Maternal Medications:  None Significant Maternal Lab Results:  None Other Comments:  None  ROS  See pertinent in HPI  History Dilation: 4 Effacement (%): 70 Station: -3 Exam by:: Erasmo Score RNC Blood pressure 117/81,  pulse 92, temperature 98 F (36.7 C), resp. rate 18, not currently breastfeeding. Exam Physical Exam  GENERAL: Well-developed, well-nourished female in no acute distress.  ABDOMEN: Soft, nontender, gravid PELVIC: see above RN exam EXTREMITIES: No cyanosis, clubbing, or edema, 2+ distal pulses.  Prenatal labs: ABO, Rh: --/--/O POS (02/10 1950) Antibody: NEG (02/10 1950) Rubella: 3.62 (08/21 1517) RPR: Non Reactive (02/07 1050)  HBsAg: Negative (08/21 1517)  HIV: Non Reactive (02/07 1050)  GBS: Negative (02/14 1540)   Assessment/Plan: 31 yo G4P1021 at [redacted]w[redacted]d in latent labor - patient with cervical change - tracing is reactive - Will admit for observation - Informed patient that if no further cervical change is made, there is a possibility that she will be discharged - pain management prn   Bacilio Abascal 02/02/2017, 6:06 PM

## 2017-02-02 NOTE — Progress Notes (Signed)
Patient is here for NST. Patient placed on machine.

## 2017-02-02 NOTE — Progress Notes (Signed)
Labor Progress Note CONA DETORO is a 31 y.o. [id number] at [redacted]w[redacted]d presented for PTL.  S:  Reports worsening ctx, requesting pain meds.   O:  BP (!) 128/97   Pulse (!) 117   Temp 99 F (37.2 C) (Oral)   Resp 18   Ht 5\' 6"  (1.676 m)   Wt 98.9 kg (218 lb)   LMP  (LMP Unknown)   BMI 35.19 kg/m  EFM: baseline 145 bpm/ mod variability/ + accels/ occ variable decels  Toco: 3-5, mod SVE: 5/80/-2, vtx   A/P: 31 y.o. [id number] [redacted]w[redacted]d  1. Labor: latent labor w/cervical change 2. FWB: Cat II 3. Pain: Fentanyl ordered 4. GBS negative 5. Gestational HTN-stable Continue expectant mngt. Will not augment d/t gestational age. Monitor fetal tracing.  Julianne Handler, CNM 8:52 PM

## 2017-02-02 NOTE — Anesthesia Pain Management Evaluation Note (Signed)
  CRNA Pain Management Visit Note  Patient: [name], 31 y.o., female  "Hello I am a member of the anesthesia team at [facility]. We have an anesthesia team available at all times to provide care throughout the hospital, including epidural management and anesthesia for C-section. I don't know your plan for the delivery whether it a natural birth, water birth, IV sedation, nitrous supplementation, doula or epidural, but we want to meet your pain goals."   1.Was your pain managed to your expectations on prior hospitalizations?   Yes   2.What is your expectation for pain management during this hospitalization?     Epidural  3.How can we help you reach that goal? unsure  Record the patient's initial score and the patient's pain goal.   Pain: 10  Pain Goal: 8 The [facility] wants you to be able to say your pain was always managed very well.  Casimer Lanius 02/02/2017

## 2017-02-02 NOTE — MAU Provider Note (Signed)
Chief Complaint:  non reactive NST   First Provider Initiated Contact with Patient 02/02/17 1505     HPI: [name] is a 31 y.o. [id number] at [redacted]w[redacted]d who was sent to maternity admissions from [facility] for BPP 6/10 needing prolonged monitoring. Has NRNST at [facility] and was sent to [facility] for BPP.   Was incidentally found to have maternal tachycardia 120's and temp earlier today of 100.3, now 98.0.  Associated signs and symptoms: Neg for vaginal bleeding, fever, chills or complaints of possible infection. Initially denied LOF, but that felt on episode of leaking small amount of fluid immediately after using the restroom in MAU. Normal fetal mvmt.   In twice weekly testing for Pre-E w/out severe features.    Past Medical History:  Diagnosis Date  . Anxiety   . Asthma    inhaler PRN-hasn't used inhaler in months  . Attention deficit hyperactivity disorder   . Bipolar disorder [facility])    hospitalized as a teen for suicidal ideations and cutting  . Depression   . Gastritis   . GERD (gastroesophageal reflux disease)   . HPV (human papilloma virus) anogenital infection   . Hx of varicella   . Mild preeclampsia 02/09/2016  . Vaginal Pap smear, abnormal    OB History  Gravida Para Term Preterm AB Living  4 1 1   2 1   SAB TAB Ectopic Multiple Live Births        1 1    # Outcome Date GA Lbr Len/2nd Weight Sex Delivery Anes PTL Lv  4A Gravida           4B Current           3 Term 02/11/16 [redacted]w[redacted]d / 01:14 8 lb 2.6 oz (3.702 kg) F Vag-Spont EPI  LIV  2 AB           1 AB              Past Surgical History:  Procedure Laterality Date  . EYE MUSCLE SURGERY Bilateral    07/29/12  . MOUTH SURGERY    . PLANTAR FASCIA SURGERY    . WISDOM TOOTH EXTRACTION     Family History  Problem Relation Age of Onset  . Depression Mother   . Colon cancer Mother   . Colon polyps Mother   . Depression Father   . Depression Brother   . Liver disease    . Kidney disease     Social History  Substance Use  Topics  . Smoking status: Never Smoker  . Smokeless tobacco: Never Used  . Alcohol use No   Allergies  Allergen Reactions  . Depakote [Divalproex Sodium] Other (See Comments)    Pt states that this medication makes her blood levels toxic.    [name] Methylphenidate Derivatives Other (See Comments)    Reaction:  Depression and anger   . Neurontin [Gabapentin] Other (See Comments)    Reaction:  Dizziness   . Prozac [Fluoxetine Hcl] Other (See Comments)    Reaction:  Anger    Prescriptions Prior to Admission  Medication Sig Dispense Refill Last Dose  . acetaminophen (TYLENOL) 325 MG tablet Take 650 mg by mouth every 6 (six) hours as needed for moderate pain.   Past Week at Unknown time  . ARIPiprazole (ABILIFY) 15 MG tablet Take 15 mg by mouth at bedtime.    02/01/2017 at Unknown time  . Prenatal Vit-Fe Fumarate-FA (PRENATAL MULTIVITAMIN) TABS tablet Take 1 tablet by mouth at bedtime.  02/01/2017 at Unknown time    I have reviewed patient's Past Medical Hx, Surgical Hx, Family Hx, Social Hx, medications and allergies.   ROS:  Review of Systems  Constitutional: Negative for chills and fever.  Respiratory: Negative for cough.   Gastrointestinal: Positive for abdominal pain (contractions only). Negative for diarrhea, nausea and vomiting.  Genitourinary: Negative for vaginal bleeding.       Pos LOF    Physical Exam   Patient Vitals for the past 24 hrs:  BP Temp Pulse Resp  02/02/17 1405 117/81 98 F (36.7 C) 112 18  Pulse 115-120  Constitutional: Well-developed, well-nourished female in mild distress w/ UC's.  Cardiovascular: Mild tachycardia Respiratory: normal effort GI: Abd soft, non-tender, gravid appropriate for gestational age.  MS: Extremities nontender, no edema, normal ROM Neurologic: Alert and oriented x 4.  GU: Neg CVAT.  Pelvic: NEFG, physiologic discharge, no blood, cervix clean. No CMT  Dilation: 3/90/-3, BBOW. Neg pooling. Exam by:: Marlou Porch CNM  FHT:  Baseline  140-150 , moderate variability, accelerations present, no decelerations Contractions: Not tracing on toco. Getting more frequent throughout MAU visit.    Labs: Results for orders placed or performed during the hospital encounter of 02/02/17 (from the past 24 hour(s))  CBC with Differential/Platelet     Status: Abnormal   Collection Time: 02/02/17  3:58 PM  Result Value Ref Range   WBC 7.3 4.0 - 10.5 K/uL   RBC 3.81 (L) 3.87 - 5.11 MIL/uL   Hemoglobin 10.7 (L) 12.0 - 15.0 g/dL   HCT 32.9 (L) 36.0 - 46.0 %   MCV 86.4 78.0 - 100.0 fL   MCH 28.1 26.0 - 34.0 pg   MCHC 32.5 30.0 - 36.0 g/dL   RDW 16.1 (H) 11.5 - 15.5 %   Platelets 131 (L) 150 - 400 K/uL   Neutrophils Relative % 89 %   Neutro Abs 6.5 1.7 - 7.7 K/uL   Lymphocytes Relative 7 %   Lymphs Abs 0.5 (L) 0.7 - 4.0 K/uL   Monocytes Relative 4 %   Monocytes Absolute 0.3 0.1 - 1.0 K/uL   Eosinophils Relative 0 %   Eosinophils Absolute 0.0 0.0 - 0.7 K/uL   Basophils Relative 0 %   Basophils Absolute 0.0 0.0 - 0.1 K/uL    Imaging:  NA  MAU Course: Orders Placed This Encounter  Procedures  . CBC with Differential/Platelet  . POCT fern test   Meds ordered this encounter  Medications  . lactated ringers bolus 1,000 mL   Care of pt turned over to D.r Constant at 5:00 pm. Will repeat cervical exam in one hour and determine POC.   [place]abel, [place] 02/02/2017 5:08 PM

## 2017-02-03 ENCOUNTER — Inpatient Hospital Stay (HOSPITAL_COMMUNITY): Payer: Medicare Other | Admitting: Anesthesiology

## 2017-02-03 ENCOUNTER — Encounter (HOSPITAL_COMMUNITY): Payer: Self-pay | Admitting: *Deleted

## 2017-02-03 DIAGNOSIS — Z3A35 35 weeks gestation of pregnancy: Secondary | ICD-10-CM

## 2017-02-03 DIAGNOSIS — O135 Gestational [pregnancy-induced] hypertension without significant proteinuria, complicating the puerperium: Secondary | ICD-10-CM

## 2017-02-03 LAB — COMPREHENSIVE METABOLIC PANEL
ALT: 10 U/L — ABNORMAL LOW (ref 14–54)
AST: 15 U/L (ref 15–41)
Albumin: 2.5 g/dL — ABNORMAL LOW (ref 3.5–5.0)
Alkaline Phosphatase: 106 U/L (ref 38–126)
Anion gap: 8 (ref 5–15)
BUN: 7 mg/dL (ref 6–20)
CO2: 23 mmol/L (ref 22–32)
Calcium: 8.4 mg/dL — ABNORMAL LOW (ref 8.9–10.3)
Chloride: 105 mmol/L (ref 101–111)
Creatinine, Ser: 0.7 mg/dL (ref 0.44–1.00)
GFR calc Af Amer: >60 mL/min (ref >60)
GFR calc non Af Amer: >60 mL/min (ref >60)
Glucose, Bld: 96 mg/dL (ref 65–99)
Potassium: 3.9 mmol/L (ref 3.5–5.1)
Sodium: 136 mmol/L (ref 135–145)
Total Bilirubin: 0.5 mg/dL (ref 0.3–1.2)
Total Protein: 5.8 g/dL — ABNORMAL LOW (ref 6.5–8.1)

## 2017-02-03 LAB — INFLUENZA PANEL BY PCR (TYPE A & B)
Influenza A By PCR: NEGATIVE
Influenza B By PCR: POSITIVE — AB

## 2017-02-03 LAB — CBC
HCT: 29.6 % — ABNORMAL LOW (ref 36.0–46.0)
HCT: 30.3 % — ABNORMAL LOW (ref 36.0–46.0)
Hemoglobin: 9.7 g/dL — ABNORMAL LOW (ref 12.0–15.0)
Hemoglobin: 9.8 g/dL — ABNORMAL LOW (ref 12.0–15.0)
MCH: 28.4 pg (ref 26.0–34.0)
MCH: 28.1 pg (ref 26.0–34.0)
MCHC: 32.8 g/dL (ref 30.0–36.0)
MCHC: 32.3 g/dL (ref 30.0–36.0)
MCV: 86.8 fL (ref 78.0–100.0)
MCV: 86.8 fL (ref 78.0–100.0)
Platelets: 110 10*3/uL — ABNORMAL LOW (ref 150–400)
Platelets: 120 10*3/uL — ABNORMAL LOW (ref 150–400)
RBC: 3.41 MIL/uL — ABNORMAL LOW (ref 3.87–5.11)
RBC: 3.49 MIL/uL — ABNORMAL LOW (ref 3.87–5.11)
RDW: 16.3 % — ABNORMAL HIGH (ref 11.5–15.5)
RDW: 16.2 % — ABNORMAL HIGH (ref 11.5–15.5)
WBC: 7.2 10*3/uL (ref 4.0–10.5)
WBC: 9.8 10*3/uL (ref 4.0–10.5)

## 2017-02-03 LAB — RPR: RPR Ser Ql: NONREACTIVE

## 2017-02-03 LAB — HIV ANTIBODY (ROUTINE TESTING W REFLEX): HIV Screen 4th Generation wRfx: NONREACTIVE

## 2017-02-03 MED ORDER — ONDANSETRON HCL 4 MG/2ML IJ SOLN: 4.0000 mg | INTRAMUSCULAR | Status: DC | PRN

## 2017-02-03 MED ORDER — TETANUS-DIPHTH-ACELL PERTUSSIS 5-2.5-18.5 LF-MCG/0.5 IM SUSP: 0.5000 mL | Freq: Once | INTRAMUSCULAR | Status: DC

## 2017-02-03 MED ORDER — PHENYLEPHRINE 40 MCG/ML (10ML) SYRINGE FOR IV PUSH (FOR BLOOD PRESSURE SUPPORT)
80.0000 ug | PREFILLED_SYRINGE | INTRAVENOUS | Status: DC | PRN
  Filled 2017-02-03: qty 10

## 2017-02-03 MED ORDER — DIPHENHYDRAMINE HCL 25 MG PO CAPS: 25.0000 mg | ORAL_CAPSULE | Freq: Four times a day (QID) | ORAL | Status: DC | PRN

## 2017-02-03 MED ORDER — PRENATAL MULTIVITAMIN CH: 1.0000 | ORAL_TABLET | Freq: Every day | ORAL | Status: DC

## 2017-02-03 MED ORDER — ZOLPIDEM TARTRATE 5 MG PO TABS
5.0000 mg | ORAL_TABLET | Freq: Every evening | ORAL | Status: DC | PRN
Start: 2017-02-03 — End: 2017-02-05

## 2017-02-03 MED ORDER — LIDOCAINE HCL (PF) 1 % IJ SOLN
INTRAMUSCULAR | Status: DC | PRN
  Administered 2017-02-03: 6 mL via EPIDURAL
  Administered 2017-02-03: 4 mL

## 2017-02-03 MED ORDER — EPHEDRINE 5 MG/ML INJ: 10.0000 mg | INTRAVENOUS | Status: DC | PRN

## 2017-02-03 MED ORDER — IBUPROFEN 600 MG PO TABS
600.0000 mg | ORAL_TABLET | Freq: Four times a day (QID) | ORAL | Status: DC
  Administered 2017-02-04 – 2017-02-05 (×5): 600 mg via ORAL
  Filled 2017-02-03 (×6): qty 1

## 2017-02-03 MED ORDER — DIPHENHYDRAMINE HCL 50 MG/ML IJ SOLN: 12.5000 mg | INTRAMUSCULAR | Status: DC | PRN

## 2017-02-03 MED ORDER — PHENYLEPHRINE 40 MCG/ML (10ML) SYRINGE FOR IV PUSH (FOR BLOOD PRESSURE SUPPORT): 80.0000 ug | PREFILLED_SYRINGE | INTRAVENOUS | Status: DC | PRN

## 2017-02-03 MED ORDER — WITCH HAZEL-GLYCERIN EX PADS: 1.0000 "application " | MEDICATED_PAD | CUTANEOUS | Status: DC | PRN

## 2017-02-03 MED ORDER — ONDANSETRON HCL 4 MG PO TABS: 4.0000 mg | ORAL_TABLET | ORAL | Status: DC | PRN

## 2017-02-03 MED ORDER — OXYTOCIN 40 UNITS IN LACTATED RINGERS INFUSION - SIMPLE MED
1.0000 m[IU]/min | INTRAVENOUS | Status: DC
  Administered 2017-02-03: 2 m[IU]/min via INTRAVENOUS

## 2017-02-03 MED ORDER — OSELTAMIVIR PHOSPHATE 75 MG PO CAPS
75.0000 mg | ORAL_CAPSULE | Freq: Two times a day (BID) | ORAL | Status: DC
  Administered 2017-02-03 – 2017-02-05 (×4): 75 mg via ORAL
  Filled 2017-02-03 (×6): qty 1

## 2017-02-03 MED ORDER — BENZOCAINE-MENTHOL 20-0.5 % EX AERO: 1.0000 "application " | INHALATION_SPRAY | CUTANEOUS | Status: DC | PRN

## 2017-02-03 MED ORDER — COCONUT OIL OIL: 1.0000 "application " | TOPICAL_OIL | Status: DC | PRN

## 2017-02-03 MED ORDER — DIBUCAINE 1 % RE OINT: 1.0000 "application " | TOPICAL_OINTMENT | RECTAL | Status: DC | PRN

## 2017-02-03 MED ORDER — SIMETHICONE 80 MG PO CHEW: 80.0000 mg | CHEWABLE_TABLET | ORAL | Status: DC | PRN

## 2017-02-03 MED ORDER — TERBUTALINE SULFATE 1 MG/ML IJ SOLN: 0.2500 mg | Freq: Once | INTRAMUSCULAR | Status: DC | PRN

## 2017-02-03 MED ORDER — ACETAMINOPHEN 325 MG PO TABS
650.0000 mg | ORAL_TABLET | ORAL | Status: DC | PRN
  Administered 2017-02-03: 650 mg via ORAL
  Filled 2017-02-03: qty 2

## 2017-02-03 MED ORDER — SENNOSIDES-DOCUSATE SODIUM 8.6-50 MG PO TABS
2.0000 | ORAL_TABLET | ORAL | Status: DC
  Administered 2017-02-04: 2 via ORAL
  Filled 2017-02-03: qty 2

## 2017-02-03 MED ORDER — FENTANYL 2.5 MCG/ML BUPIVACAINE 1/10 % EPIDURAL INFUSION (WH - ANES)
14.0000 mL/h | INTRAMUSCULAR | Status: DC | PRN
  Administered 2017-02-03: 14 mL/h via EPIDURAL
  Filled 2017-02-03: qty 100

## 2017-02-03 MED ORDER — LACTATED RINGERS IV SOLN: 500.0000 mL | Freq: Once | INTRAVENOUS | Status: DC

## 2017-02-03 NOTE — Anesthesia Preprocedure Evaluation (Addendum)
Anesthesia Evaluation  Patient identified by MRN, date of birth, ID band Patient awake    Reviewed: Allergy & Precautions, H&P , Patient's Chart, lab work & pertinent test results, reviewed documented beta blocker date and time   Airway Mallampati: III  TM Distance: >3 FB Neck ROM: full    Dental no notable dental hx.    Pulmonary asthma ,    Pulmonary exam normal breath sounds clear to auscultation       Cardiovascular hypertension,  Rhythm:regular Rate:Normal     Neuro/Psych    GI/Hepatic   Endo/Other  Morbid obesity  Renal/GU      Musculoskeletal   Abdominal   Peds  Hematology  (+) anemia ,   Anesthesia Other Findings Plts 110  Reproductive/Obstetrics                            Anesthesia Physical Anesthesia Plan  ASA: III  Anesthesia Plan: Epidural   Post-op Pain Management:    Induction:   Airway Management Planned:   Additional Equipment:   Intra-op Plan:   Post-operative Plan:   Informed Consent: I have reviewed the patients History and Physical, chart, labs and discussed the procedure including the risks, benefits and alternatives for the proposed anesthesia with the patient or authorized representative who has indicated his/her understanding and acceptance.   Dental Advisory Given  Plan Discussed with: CRNA and Surgeon  Anesthesia Plan Comments: (Last CLE: Needle insertion depth: 9 cm Catheter type: closed end flexible Catheter size: 19 Gauge Catheter at skin depth: 15 cm  Labs checked- platelets confirmed with RN in room. Fetal heart tracing, per RN, reported to be stable enough for sitting procedure. Discussed epidural, and patient consents to the procedure:  included risk of possible headache,backache, failed block, allergic reaction, and nerve injury. This patient was asked if she had any questions or concerns before the procedure started.)        Anesthesia Quick Evaluation

## 2017-02-03 NOTE — Anesthesia Procedure Notes (Signed)
Epidural Patient location during procedure: OB  Staffing Anesthesiologist: Lyndle Herrlich  Preanesthetic Checklist Completed: patient identified, site marked, surgical consent, pre-op evaluation, timeout performed, IV checked, risks and benefits discussed and monitors and equipment checked  Epidural Patient position: sitting Prep: site prepped and draped and DuraPrep Patient monitoring: continuous pulse ox and blood pressure Approach: midline Location: L3-L4 Injection technique: LOR air  Needle:  Needle type: Tuohy  Needle gauge: 17 G Needle length: 9 cm and 9 Needle insertion depth: 8 cm Catheter type: closed end flexible Catheter size: 19 Gauge Test dose: negative  Assessment Events: blood not aspirated, injection not painful, no injection resistance, negative IV test and no paresthesia  Additional Notes Dosing of Epidural:  1st dose, through catheter ............................................[name]  Xylocaine 40 mg  2nd dose, through catheter, after waiting 3 minutes........Marland KitchenXylocaine 60 mg    As each dose occurred, patient was free of IV sx; and patient exhibited no evidence of SA injection.  Patient is more comfortable after epidural dosed. Please see RN's note for documentation of vital signs,and FHR which are stable.  Patient reminded not to try to ambulate with numb legs, and that an RN must be present when she attempts to get up.

## 2017-02-03 NOTE — Progress Notes (Signed)
Labor Progress Note ESM… [name] is a 31 y.o. [id number] at [redacted]w[redacted]d presented for contractions.  S:  Feeling contractions, more uncomfortable, requesting epidural.  O:  BP 115/80   Pulse 89   Temp 99 F (37.2 C) (Oral)   Resp 18   Ht 5\' 6"  (1.676 m)   Wt 98.9 kg (218 lb)   LMP  (LMP Unknown)   BMI 35.19 kg/m  EFM: baseline 135 bpm/ mod variability/ + accels/ no decels  Toco: 4-6 SVE: Dilation: 5.5 Effacement (%): 90 Cervical Position: Middle Station: -3 Presentation: Vertex Exam by:: Peri Jefferson, CNM   A/P: 31 y.o. [id number] [redacted]w[redacted]d  1. Labor: early active 2. FWB: Cat I 3. Pain: analgesia/anesthesia prn 4. GHTN-stable Slow cervical change, now in early active labor.  Anticipate SVD.  Julianne Handler, CNM 6:00 AM

## 2017-02-03 NOTE — Progress Notes (Signed)
Informed by nursing, patient is positive for flu. Vitals reviewed and wnl. Continue supportive care will start tamiflu at this time.

## 2017-02-03 NOTE — Progress Notes (Signed)
CSW acknowledges consult for homeless issues and is aware of many psychosocial issues with this patient.  CSW also notes that she is in early active labor and will plan to meet with patient after delivery.  Please call CSW if there is an urgent need prior to delivery.

## 2017-02-03 NOTE — Progress Notes (Signed)
Dr Glennon Mac updated on current CBC.  Order to remove catheter.  Tip intact.

## 2017-02-03 NOTE — Progress Notes (Signed)
[name] is a 31 y.o. [id number] at [redacted]w[redacted]d   Subjective: Patient says her pain is well controlled after epidural. Minimal bleeding with no leakage of fluid.  Objective: BP 128/79   Pulse 72   Temp 98 F (36.7 C) (Oral)   Resp 15   Ht 5\' 6"  (1.676 m)   Wt 218 lb (98.9 kg)   LMP  (LMP Unknown)   BMI 35.19 kg/m  No intake/output data recorded. No intake/output data recorded.  FHT:  FHR: 125 bpm, variability: minimal ,  accelerations:  Present,  decelerations:  Absent UC:   4-6 SVE:   Dilation: 6 Effacement (%): 100 Station: -2 Exam by:: Peri Jefferson, CNM  Labs: Lab Results  Component Value Date   WBC 7.2 02/03/2017   HGB 9.7 (L) 02/03/2017   HCT 29.6 (L) 02/03/2017   MCV 86.8 02/03/2017   PLT 110 (L) 02/03/2017    Assessment / Plan: Spontaneous labor, progressing normally, LFTs wnl this AM. BP 120s/70s.  Labor: Progressing normally Preeclampsia:  None Fetal Wellbeing:  Category I Pain Control:  Epidural I/D:  n/a Anticipated MOD:  NSVD  Florene Route, PGY-1 02/03/2017, 11:14 AM

## 2017-02-04 MED ORDER — POLYSACCHARIDE IRON COMPLEX 150 MG PO CAPS
150.0000 mg | ORAL_CAPSULE | Freq: Two times a day (BID) | ORAL | Status: DC
  Administered 2017-02-04 – 2017-02-05 (×3): 150 mg via ORAL
  Filled 2017-02-04 (×3): qty 1

## 2017-02-04 MED ORDER — AMLODIPINE BESYLATE 5 MG PO TABS
5.0000 mg | ORAL_TABLET | Freq: Every day | ORAL | Status: DC
  Administered 2017-02-04 – 2017-02-05 (×2): 5 mg via ORAL
  Filled 2017-02-04 (×3): qty 1

## 2017-02-04 NOTE — Progress Notes (Signed)
CSW spoke with [name], [name], and [name]'s room guest, Melina Schools regarding NICU visitation restriction.  CSW reiterated the importance of [name]'s guest to wear a face mask due to [name]'s positive Flu screening.  However, [name]'s guest did not attempt to place a face mask on while CSW was present.  [name] requested a food voucher for [name].  CSW will provide [name] with a voucher after 3:30pm today.  Laurey Arrow, MSW, LCSW Clinical Social Work [phone]

## 2017-02-04 NOTE — Progress Notes (Signed)
Post Partum Day #1 Subjective: no complaints, up ad lib, voiding, tolerating PO, + flatus and reports occasional coughing.    Objective: Blood pressure (!) 135/95, pulse 68, temperature 97.8 F (36.6 C), temperature source Oral, resp. rate 18, height 5\' 6"  (1.676 m), weight 218 lb (98.9 kg), unknown if currently breastfeeding.  Physical Exam:  General: alert, cooperative and no distress Lochia: appropriate Uterine Fundus: firm Incision: none DVT Evaluation: No evidence of DVT seen on physical exam. No cords or calf tenderness. No significant calf/ankle edema.   Recent Labs  02/03/17 0617 02/03/17 1602  HGB 9.7* 9.8*  HCT 29.6* 30.3*    Assessment/Plan: Plan for discharge tomorrow and Social Work consult.  Anemia: iron started.  On Tamiflu for +flu test.  T-max: 100.3.  Blood pressures range: 145/80-135/95.  Norvasc 5 mg started.  Infant in NICU for unstable blood sugars.  Platelets this AM: 120.     LOS: 2 days   [name] [name] 02/04/2017, 8:40 AM

## 2017-02-04 NOTE — Progress Notes (Signed)
A full clinical assessment was completed.  Results to follow.   There are barriers to d/c.  [facility] CPS report was made to [place] intake worker, Wendall Stade.   Laurey Arrow, MSW, LCSW Clinical Social Work [phone]

## 2017-02-04 NOTE — Progress Notes (Signed)
CSW received numerous telephone calls and voicemail messages from [facility] and [name]'s bedside nurse requesting CSW to meet with [name].  CSW returned [name]'s phone call and explained to [name] that CSW was attending a scheduled meeting.  CSW informed [name] that CSW CPS report was accepted as a 24 hour report and CPS worker, Lenell Antu, has been assigned to [name]'s case.  [name] had numerous question about why [name]'s case was assigned as a 24 hour case and CSW attempted to explain rational to [name]. [name] appeared anxious and asked repeated questions about foster care, and infant being taken out [name]'s custody.  CSW explained CPS investigation procedure and encouraged [name] to ask CPS worker questions when he arrives to complete his initial CPS assessment.    CSW will leave food voucher with [name]'s bedside nurse for [name].   Laurey Arrow, MSW, LCSW Clinical Social Work [phone]

## 2017-02-04 NOTE — Anesthesia Postprocedure Evaluation (Signed)
Anesthesia Post Note  Patient: [name]  Procedure(s) Performed: * No procedures listed *  Patient location during evaluation: Mother Baby Anesthesia Type: Epidural Level of consciousness: awake Pain management: pain level controlled Vital Signs Assessment: vitals unstable Respiratory status: spontaneous breathing Cardiovascular status: stable Postop Assessment: no backache, epidural receding, no signs of nausea or vomiting, no headache and patient able to bend at knees Anesthetic complications: no        Last Vitals:  Vitals:   02/03/17 1833 02/04/17 0546  BP:  (!) 135/95  Pulse:  68  Resp:  18  Temp: 37.8 C 36.6 C    Last Pain:  Vitals:   02/04/17 0546  TempSrc: Oral  PainSc:    Pain Goal: Patients Stated Pain Goal: 0 (02/02/17 1428)               Bufford Spikes

## 2017-02-05 ENCOUNTER — Encounter: Payer: Medicare Other | Admitting: Certified Nurse Midwife

## 2017-02-05 MED ORDER — AMLODIPINE BESYLATE 5 MG PO TABS: 5.0000 mg | ORAL_TABLET | Freq: Every day | ORAL | 5 refills | Status: DC

## 2017-02-05 MED ORDER — IBUPROFEN 600 MG PO TABS: 600.0000 mg | ORAL_TABLET | Freq: Four times a day (QID) | ORAL | 2 refills | Status: DC

## 2017-02-05 MED ORDER — FERROUS GLUCONATE 240 (27 FE) MG PO TABS: 240.0000 mg | ORAL_TABLET | Freq: Three times a day (TID) | ORAL | 2 refills | Status: DC

## 2017-02-05 MED ORDER — OSELTAMIVIR PHOSPHATE 75 MG PO CAPS: 75.0000 mg | ORAL_CAPSULE | Freq: Two times a day (BID) | ORAL | 0 refills | Status: DC

## 2017-02-05 NOTE — Progress Notes (Signed)
CSW left CPS worker, Lenell Antu, a voicemail message requesting a call back regarding d/c plans for [name].  Laurey Arrow, MSW, LCSW Clinical Social Work [phone]

## 2017-02-05 NOTE — Progress Notes (Addendum)
Post Partum Day #2 Subjective: no complaints, up ad lib, voiding and tolerating PO  Objective: Blood pressure 106/73, pulse 61, temperature 98.1 F (36.7 C), temperature source Oral, resp. rate 18, height 5\' 6"  (1.676 m), weight 218 lb (98.9 kg), unknown if currently breastfeeding.  Physical Exam:  General: alert, cooperative and no distress Lochia: appropriate Uterine Fundus: firm Incision: n/a DVT Evaluation: No evidence of DVT seen on physical exam. No cords or calf tenderness. No significant calf/ankle edema.   Recent Labs  02/03/17 0617 02/03/17 1602  HGB 9.7* 9.8*  HCT 29.6* 30.3*    Assessment/Plan: Discharge home and Social Work consult: in process.  DSS meeting this morning.  Infant in NICU.  Anemia: asymptomatic, on iron.     LOS: 3 days   Morene Crocker, CNM 02/05/2017, 8:00 AM

## 2017-02-05 NOTE — Discharge Summary (Addendum)
OB Discharge Summary     Patient Name: [name] DOB: 15-Jan-1986 MRN: [id number]  Date of admission: 02/02/2017 Delivering MD: Truett Mainland   Date of discharge: 02/05/2017  Admitting diagnosis: MONITOR Intrauterine pregnancy: [redacted]w[redacted]d     Secondary diagnosis:  Active Problems:   Preterm labor in third trimester [name] is a 31 y.o. female [phone] at [redacted]w[redacted]d presenting for further monitoring following a 6/8 BPP. While being monitored patient complained of worsening pelvic pressure and possible rupture of membrane. She was ruled out for rupture by [place]. Patient with prenatal care at [facility] complicated by gestational HTN and mental retardation. Over the course of her MAU stay, patient made cervical change from 3/90 to 4/70 with bulging membrane.  Additional problems: Influenza B     Discharge diagnosis: Preterm Pregnancy Delivered, CHTN and GERD, Bipolar disorder, Obesity, OSA, venearal warts, mild mental retardations, multiple other problems on the problem list.                                                                                                Post partum procedures:none  Augmentation: Pitocin  Complications: None  Hospital course:  Onset of Labor With Vaginal Delivery     31 y.o. yo [id number] at [redacted]w[redacted]d was admitted in Active Labor on 02/02/2017. Patient had an uncomplicated labor course as follows:  Membrane Rupture Time/Date: 12:50 PM ,02/03/2017   Intrapartum Procedures: Episiotomy: None [1]                                         Lacerations:  None [1]  Patient had a delivery of a Viable infant. 02/03/2017  Information for the patient's newborn:  Curtistine, Poppa [id number]  Delivery Method: Vaginal, Spontaneous Delivery (Filed from Delivery Summary)    Pateint had an uncomplicated postpartum course.  She is ambulating, tolerating a regular diet, passing flatus, and urinating well. Patient is discharged home in stable condition on  02/05/17.   Physical exam  Vitals:   02/04/17 1345 02/04/17 1859 02/04/17 1950 02/05/17 0545  BP:  130/87 121/72 106/73  Pulse:  89 70 61  Resp:  20 18 18   Temp: 97.8 F (36.6 C) 97.9 F (36.6 C) 97.5 F (36.4 C) 98.1 F (36.7 C)  TempSrc: Oral Oral Oral Oral  Weight:      Height:       General: alert, cooperative and no distress Lochia: appropriate Uterine Fundus: firm Incision: N/A DVT Evaluation: No evidence of DVT seen on physical exam. No cords or calf tenderness. No significant calf/ankle edema. Labs: Lab Results  Component Value Date   WBC 9.8 02/03/2017   HGB 9.8 (L) 02/03/2017   HCT 30.3 (L) 02/03/2017   MCV 86.8 02/03/2017   PLT 120 (L) 02/03/2017   CMP Latest Ref Rng & Units 02/03/2017  Glucose 65 - 99 mg/dL 96  BUN 6 - 20 mg/dL 7  Creatinine 0.44 - 1.00 mg/dL 0.70  Sodium 135 - 145 mmol/L 136  Potassium 3.5 - 5.1 mmol/L 3.9  Chloride 101 - 111 mmol/L 105  CO2 22 - 32 mmol/L 23  Calcium 8.9 - 10.3 mg/dL 8.4(L)  Total Protein 6.5 - 8.1 g/dL 5.8(L)  Total Bilirubin 0.3 - 1.2 mg/dL 0.5  Alkaline Phos 38 - 126 U/L 106  AST 15 - 41 U/L 15  ALT 14 - 54 U/L 10(L)    Discharge instruction: per After Visit Summary and "Baby and Me Booklet".  After visit meds:  Allergies as of 02/05/2017      Reactions   Depakote [divalproex Sodium] Other (See Comments)   Pt states that this medication makes her blood levels toxic.     Methylphenidate Derivatives Other (See Comments)   Reaction:  Depression and anger    Neurontin [gabapentin] Other (See Comments)   Reaction:  Dizziness    Prozac [fluoxetine Hcl] Other (See Comments)   Reaction:  Anger       Medication List    TAKE these medications   acetaminophen 325 MG tablet Commonly known as:  TYLENOL Take 650 mg by mouth every 6 (six) hours as needed for moderate pain.   ARIPiprazole 15 MG tablet Commonly known as:  ABILIFY Take 15 mg by mouth at bedtime.   ferrous gluconate 240 (27 FE) MG  tablet Commonly known as:  IRON 27 Take 1 tablet (240 mg total) by mouth 3 (three) times daily with meals.   ibuprofen 600 MG tablet Commonly known as:  ADVIL,MOTRIN Take 1 tablet (600 mg total) by mouth every 6 (six) hours.   oseltamivir 75 MG capsule Commonly known as:  TAMIFLU Take 1 capsule (75 mg total) by mouth 2 (two) times daily.   prenatal multivitamin Tabs tablet Take 1 tablet by mouth at bedtime.       Diet: routine diet  Activity: Advance as tolerated. Pelvic rest for 6 weeks.   Outpatient follow up:4 weeks Follow up Appt:No future appointments. Follow up Visit:No Follow-up on file.  Postpartum contraception: Undecided  Newborn Data: Live born female  Birth Weight: 7 lb 7.9 oz (3400 g) APGAR: 7, 9  Baby Feeding: Bottle Disposition:NICU   02/05/2017 Morene Crocker, CNM
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
## 2017-02-08 ENCOUNTER — Telehealth: Payer: Self-pay | Admitting: Internal Medicine

## 2017-02-08 NOTE — Telephone Encounter (Signed)
   Reason for call:   I received a call from [name] at 11:31 AM indicating that she is having stiffness and aches in her right upper back and neck.   Pertinent Data:   Patient with right sided upper back and neck aches/stiffness beginning 2 days ago.  Pain is worse in morning and she needs assistance from husband to get out of bed.  She had a NSVD on [id number] with uncomplicated postpartum course.  She was positive for Influenza B on 02/03/17. She was prescribed Tamiflu, but patient states she has yet to pick this up. She has been drinking plenty of fluids to try to stay hydrated.  Patient has tried Naproxen without significant relief.  Patient has relief with hot showers. She does not have a heating pad.  She denies any nausea, vomiting, fevers, chills, diaphoresis, focal weakness, numbness, tingling, headache.  Patient reports black stool. She does take iron supplements. Denies any BRBPR.   Assessment / Plan / Recommendations:   Patient with likely musculoskeletal back and neck pain. Recommend that she try using a heating pad throughout the day, Tylenol, and/or NSAIDs as needed. Also recommended stretching exercises, however patient does not feel she will be able to perform these.  Advised patient to call our clinic tomorrow morning for an [facility] visit for further evaluation. Black stools likely from Iron supplement, however should consider CBC, FOBT to check for bleeding.  Patient states her preferred contact number is [phone]; this is up to date in the demographics tab.  As always, pt is advised that if symptoms worsen or new symptoms arise, they should go to an urgent care facility or to to ER for further evaluation.   Zada Finders, MD   02/08/2017, 12:51 PM

## 2017-02-09 NOTE — Telephone Encounter (Signed)
Just spoke with patient.  She agreed to an appt 02/10/17 at 3:45 pm.

## 2017-02-10 ENCOUNTER — Ambulatory Visit: Payer: Medicare Other

## 2017-02-10 ENCOUNTER — Encounter: Payer: Self-pay | Admitting: Internal Medicine

## 2017-02-11 ENCOUNTER — Inpatient Hospital Stay (HOSPITAL_COMMUNITY): Admission: RE | Admit: 2017-02-11 | Payer: Medicare Other | Source: Ambulatory Visit

## 2017-03-01 DIAGNOSIS — N912 Amenorrhea, unspecified: Secondary | ICD-10-CM | POA: Diagnosis not present

## 2017-03-09 ENCOUNTER — Telehealth: Payer: Self-pay | Admitting: *Deleted

## 2017-03-09 ENCOUNTER — Encounter: Payer: Self-pay | Admitting: *Deleted

## 2017-03-09 ENCOUNTER — Other Ambulatory Visit: Payer: Self-pay | Admitting: Certified Nurse Midwife

## 2017-03-09 ENCOUNTER — Telehealth: Payer: Self-pay | Admitting: Internal Medicine

## 2017-03-09 NOTE — Telephone Encounter (Signed)
She needs to be seen by her OB or in clinic for evaluation.  I am assuming she was taken off meds for hypotension?  Mild tachycardia could be physiological, due to bleeding, or dehydration.  She may be more appropriate to go see her OB if she has not followed up yet.   Thanks.

## 2017-03-09 NOTE — Telephone Encounter (Signed)
Please have the patient come in to be evaluated in the office and discuss getting restarted on meds. A referral to Roselyn Reef (behavioral health specialist) may be helpful after in clinic assessment

## 2017-03-09 NOTE — Telephone Encounter (Signed)
Called nurse back and gave her dr mullen's advice, gave [facility] for [facility] and OB dr name Nurse also states on her visit she noted pt has 2 black eyes. Stressed to her that she needs to let [facility] clinic know that information Will pass info to [place] for possible ADS and CPS referral

## 2017-03-09 NOTE — Telephone Encounter (Signed)
Abigail Butts (NP for [facility]) stated while doing a well visit check up for patient, she noted HR 100-104 with heart murmur. BP 108/79, patient is post partum & not taking any of her medicines. She was taken off BP meds due to gestational HTN.  Dialed patient's number but could not leave msg (mailbox full). Left sms msg to call us back.

## 2017-03-09 NOTE — Telephone Encounter (Signed)
Call sent to triage.
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
## 2017-03-09 NOTE — Telephone Encounter (Signed)
error 

## 2017-03-10 ENCOUNTER — Ambulatory Visit: Payer: Medicare Other | Admitting: Obstetrics & Gynecology

## 2017-03-10 NOTE — Telephone Encounter (Signed)
Discussed with Janett Billow SW this AM, DSS was made aware of the situation.  Infant has appointment this AM, SW to meet with patient. SW also aware that patient has been seen by [facility] in the past and will need to f/u with them, unsure if she has an appointment at this time.   SW aware that patient needs postpartum appointment ASAP.   R.Averleigh Savary CNM

## 2017-03-10 NOTE — Telephone Encounter (Signed)
Agree with plan 

## 2017-04-02 ENCOUNTER — Emergency Department (HOSPITAL_COMMUNITY)
Admission: EM | Admit: 2017-04-02 | Discharge: 2017-04-02 | Discharge status: Against Medical Advice | Payer: Medicare Other

## 2017-05-13 DIAGNOSIS — N925 Other specified irregular menstruation: Secondary | ICD-10-CM | POA: Diagnosis not present

## 2017-06-27 ENCOUNTER — Encounter (HOSPITAL_COMMUNITY): Payer: Self-pay

## 2017-06-27 ENCOUNTER — Inpatient Hospital Stay (HOSPITAL_COMMUNITY)
Admission: AD | Admit: 2017-06-27 | Discharge: 2017-06-27 | Disposition: A | Discharge status: Home / Self Care | Payer: Medicare Other | Source: Ambulatory Visit | Attending: Obstetrics and Gynecology | Admitting: Obstetrics and Gynecology

## 2017-06-27 DIAGNOSIS — Z3200 Encounter for pregnancy test, result unknown: Secondary | ICD-10-CM | POA: Diagnosis present

## 2017-06-27 DIAGNOSIS — Z3490 Encounter for supervision of normal pregnancy, unspecified, unspecified trimester: Secondary | ICD-10-CM

## 2017-06-27 DIAGNOSIS — Z711 Person with feared health complaint in whom no diagnosis is made: Secondary | ICD-10-CM | POA: Insufficient documentation

## 2017-06-27 NOTE — MAU Note (Signed)
Wants preg confirmed, is on meds that can't be taken when preg.  Denies pain or bleeding.
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
## 2017-06-27 NOTE — MAU Note (Signed)
Patient requested this RN listen for fht.  Attempted with doppler no fhr.  Explained to patient too early to hear a heart beat.  She verbalized an understanding and left with her husband and family friend.

## 2017-07-04 ENCOUNTER — Inpatient Hospital Stay (HOSPITAL_COMMUNITY): Payer: Medicare Other

## 2017-07-04 ENCOUNTER — Encounter (HOSPITAL_COMMUNITY): Payer: Self-pay | Admitting: *Deleted

## 2017-07-04 ENCOUNTER — Inpatient Hospital Stay (HOSPITAL_COMMUNITY)
Admission: AD | Admit: 2017-07-04 | Discharge: 2017-07-04 | Disposition: A | Discharge status: Home / Self Care | Payer: Medicare Other | Source: Ambulatory Visit | Attending: Obstetrics and Gynecology | Admitting: Obstetrics and Gynecology

## 2017-07-04 DIAGNOSIS — O26891 Other specified pregnancy related conditions, first trimester: Secondary | ICD-10-CM

## 2017-07-04 DIAGNOSIS — R109 Unspecified abdominal pain: Secondary | ICD-10-CM | POA: Insufficient documentation

## 2017-07-04 DIAGNOSIS — O200 Threatened abortion: Secondary | ICD-10-CM

## 2017-07-04 DIAGNOSIS — Z3A01 Less than 8 weeks gestation of pregnancy: Secondary | ICD-10-CM | POA: Diagnosis not present

## 2017-07-04 DIAGNOSIS — O4691 Antepartum hemorrhage, unspecified, first trimester: Secondary | ICD-10-CM | POA: Diagnosis present

## 2017-07-04 LAB — URINALYSIS, ROUTINE W REFLEX MICROSCOPIC
Bacteria, UA: NONE SEEN
Bilirubin Urine: NEGATIVE
Glucose, UA: NEGATIVE mg/dL
Hgb urine dipstick: NEGATIVE
Ketones, ur: NEGATIVE mg/dL
Nitrite: NEGATIVE
Protein, ur: 30 mg/dL — AB
Specific Gravity, Urine: 1.024 (ref 1.005–1.030)
pH: 5 (ref 5.0–8.0)

## 2017-07-04 LAB — POCT PREGNANCY, URINE: Preg Test, Ur: POSITIVE — AB

## 2017-07-04 LAB — HCG, QUANTITATIVE, PREGNANCY: hCG, Beta Chain, Quant, S: 2750 m[IU]/mL — ABNORMAL HIGH (ref <5)
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
## 2017-07-04 NOTE — MAU Note (Signed)
Pt is a [id number] of unknown gestation d/t unknown LMP, possibly 7.3 weeks presenting to triage with positive pregnancy testand vaginal bleeding.  Current EDD given by [place] based on LMP, although pt does not know when last LMP was as that she has only had 1 period since birth of last child in February.

## 2017-07-04 NOTE — Discharge Instructions (Signed)
Abdominal Pain, Adult Many things can cause belly (abdominal) pain. Most times, belly pain is not dangerous. Many cases of belly pain can be watched and treated at home. Sometimes belly pain is serious, though. Your doctor will try to find the cause of your belly pain. Follow these instructions at home:  Take over-the-counter and prescription medicines only as told by your doctor. Do not take medicines that help you poop (laxatives) unless told to by your doctor.  Drink enough fluid to keep your pee (urine) clear or pale yellow.  Watch your belly pain for any changes.  Keep all follow-up visits as told by your doctor. This is important. Contact a doctor if:  Your belly pain changes or gets worse.  You are not hungry, or you lose weight without trying.  You are having trouble pooping (constipated) or have watery poop (diarrhea) for more than 2-3 days.  You have pain when you pee or poop.  Your belly pain wakes you up at night.  Your pain gets worse with meals, after eating, or with certain foods.  You are throwing up and cannot keep anything down.  You have a fever. Get help right away if:  Your pain does not go away as soon as your doctor says it should.  You cannot stop throwing up.  Your pain is only in areas of your belly, such as the right side or the left lower part of the belly.  You have bloody or black poop, or poop that looks like tar.  You have very bad pain, cramping, or bloating in your belly.  You have signs of not having enough fluid or water in your body (dehydration), such as: ? Dark pee, very little pee, or no pee. ? Cracked lips. ? Dry mouth. ? Sunken eyes. ? Sleepiness. ? Weakness. This information is not intended to replace advice given to you by your health care provider. Make sure you discuss any questions you have with your health care provider. Document Released: 05/19/2008 Document Revised: 06/20/2016 Document Reviewed: 05/14/2016 Elsevier  Interactive Patient Education  2017 Elsevier Inc.   Abdominal Pain During Pregnancy Belly (abdominal) pain is common during pregnancy. Most of the time, it is not a serious problem. Other times, it can be a sign that something is wrong with the pregnancy. Always tell your doctor if you have belly pain. Follow these instructions at home: Monitor your belly pain for any changes. The following actions may help you feel better:  Do not have sex (intercourse) or put anything in your vagina until you feel better.  Rest until your pain stops.  Drink clear fluids if you feel sick to your stomach (nauseous). Do not eat solid food until you feel better.  Only take medicine as told by your doctor.  Keep all doctor visits as told.  Get help right away if:  You are bleeding, leaking fluid, or pieces of tissue come out of your vagina.  You have more pain or cramping.  You keep throwing up (vomiting).  You have pain when you pee (urinate) or have blood in your pee.  You have a fever.  You do not feel your baby moving as much.  You feel very weak or feel like passing out.  You have trouble breathing, with or without belly pain.  You have a very bad headache and belly pain.  You have fluid leaking from your vagina and belly pain.  You keep having watery poop (diarrhea).  Your belly pain does  not go away after resting, or the pain gets worse. This information is not intended to replace advice given to you by your health care provider. Make sure you discuss any questions you have with your health care provider. Document Released: 11/19/2009 Document Revised: 07/09/2016 Document Reviewed: 06/30/2013 Elsevier Interactive Patient Education  [employer].

## 2017-07-06 LAB — GC/CHLAMYDIA PROBE AMP (~~LOC~~) NOT AT ARMC
Chlamydia: NEGATIVE
Neisseria Gonorrhea: NEGATIVE

## 2017-07-09 ENCOUNTER — Ambulatory Visit: Payer: Medicare Other | Admitting: *Deleted

## 2017-07-09 ENCOUNTER — Ambulatory Visit (HOSPITAL_COMMUNITY)
Admission: RE | Admit: 2017-07-09 | Discharge: 2017-07-09 | Disposition: A | Discharge status: Home / Self Care | Payer: Medicare Other | Source: Ambulatory Visit | Attending: Obstetrics and Gynecology | Admitting: Obstetrics and Gynecology

## 2017-07-09 DIAGNOSIS — O26891 Other specified pregnancy related conditions, first trimester: Secondary | ICD-10-CM | POA: Insufficient documentation

## 2017-07-09 DIAGNOSIS — R109 Unspecified abdominal pain: Secondary | ICD-10-CM | POA: Diagnosis not present

## 2017-07-09 DIAGNOSIS — O200 Threatened abortion: Secondary | ICD-10-CM

## 2017-07-09 DIAGNOSIS — O3680X0 Pregnancy with inconclusive fetal viability, not applicable or unspecified: Secondary | ICD-10-CM | POA: Diagnosis not present

## 2017-07-09 NOTE — Progress Notes (Signed)
[place] results reviewed with Dr. Nehemiah Settle.  Orders received for BHCG. Pt and husband informed of [place] results today showing no pregnancy and she has had a miscarriage. Pt was visibly upset, had difficulty understanding the results and had multiple questions about how she had a miscarriage without bleeding. Also she wanted to know if we are sure and if the pregnancy might show up later. Pt was advised that based on the [place] results today, we are sure that she does not have a growing pregnancy. It was explained that we will test a pregnancy hormone level today as final confirmation that the pregnancy is resolving and no further treatment will be indicated. She will be called with test results. Pt voiced understanding.

## 2017-07-10 ENCOUNTER — Encounter (HOSPITAL_COMMUNITY): Payer: Self-pay

## 2017-07-10 ENCOUNTER — Inpatient Hospital Stay (HOSPITAL_COMMUNITY)
Admission: AD | Admit: 2017-07-10 | Discharge: 2017-07-10 | Disposition: A | Discharge status: Home / Self Care | Payer: Medicare Other | Source: Ambulatory Visit | Attending: Obstetrics and Gynecology | Admitting: Obstetrics and Gynecology

## 2017-07-10 ENCOUNTER — Telehealth: Payer: Self-pay | Admitting: *Deleted

## 2017-07-10 DIAGNOSIS — Z3491 Encounter for supervision of normal pregnancy, unspecified, first trimester: Secondary | ICD-10-CM

## 2017-07-10 DIAGNOSIS — R1032 Left lower quadrant pain: Secondary | ICD-10-CM | POA: Diagnosis not present

## 2017-07-10 DIAGNOSIS — Z3A01 Less than 8 weeks gestation of pregnancy: Secondary | ICD-10-CM | POA: Insufficient documentation

## 2017-07-10 DIAGNOSIS — O9989 Other specified diseases and conditions complicating pregnancy, childbirth and the puerperium: Secondary | ICD-10-CM

## 2017-07-10 DIAGNOSIS — O26891 Other specified pregnancy related conditions, first trimester: Secondary | ICD-10-CM | POA: Diagnosis present

## 2017-07-10 DIAGNOSIS — R109 Unspecified abdominal pain: Secondary | ICD-10-CM | POA: Insufficient documentation

## 2017-07-10 LAB — URINALYSIS, ROUTINE W REFLEX MICROSCOPIC
Bilirubin Urine: NEGATIVE
Glucose, UA: NEGATIVE mg/dL
Hgb urine dipstick: NEGATIVE
Ketones, ur: NEGATIVE mg/dL
Nitrite: NEGATIVE
Protein, ur: 30 mg/dL — AB
Specific Gravity, Urine: 1.025 (ref 1.005–1.030)
pH: 5 (ref 5.0–8.0)

## 2017-07-10 LAB — BETA HCG QUANT (REF LAB): hCG Quant: 14213 m[IU]/mL

## 2017-07-10 NOTE — Telephone Encounter (Signed)
Pt called for BHCG results from yesterday. I advised that the results have been reviewed by the doctor and he recommends that she have repeat BHCG tomorrow. This is because her hormone level increased from the previous level done on 7/21. Pt sounded tearful and asked if that could mean that she didn't have a miscarriage. I stated that is possible although not likely since her [place] yesterday showed no pregnancy. Pt also had multiple questions including about how far along the pregnancy is. I advised these questions can better be answered tomorrow after receiving the results of her hormone level and that as of now, it does not appear that she has a living pregnancy. The plan of care going forward will be made after her blood test tomorrow.  She will need to wait for the results which will take about an hour. Pt voiced understanding.

## 2017-07-10 NOTE — MAU Note (Signed)
Pain started around 2 this afternoon, is on left side sharp, comes and goes, feels like being stabbed.[name]

## 2017-07-10 NOTE — MAU Provider Note (Signed)
Chief Complaint: Abdominal Pain   None     SUBJECTIVE HPI: [name] is a 31 y.o. [id number] at [redacted]w[redacted]d by LMP who presents to maternity admissions reporting left flank pain starting today that is intermittent, occurs 1-2 hours after eating, sharp, and unchanged in intensity since onset.  She has not tried any treatments, nothing makes it better or worse. She is worried that she may be miscarrying because she was told at her office visit yesterday that the pregnancy was not visualized so she should follow up for a possible miscarriage.  She denies vaginal bleeding or lower abdominal cramping. She denies vaginal itching/burning, urinary symptoms, h/a, dizziness, n/v, or fever/chills.     HPI  Past Medical History:  Diagnosis Date  . Anxiety   . Asthma    inhaler PRN-hasn't used inhaler in months  . Attention deficit hyperactivity disorder   . Bipolar disorder [facility])    hospitalized as a teen for suicidal ideations and cutting  . Depression   . Gastritis   . GERD (gastroesophageal reflux disease)   . HPV (human papilloma virus) anogenital infection   . Hx of varicella   . Mild preeclampsia 02/09/2016  . Vaginal Pap smear, abnormal    Past Surgical History:  Procedure Laterality Date  . EYE MUSCLE SURGERY Bilateral    07/29/12  . MOUTH SURGERY    . PLANTAR FASCIA SURGERY    . WISDOM TOOTH EXTRACTION     Social History   Social History  . Marital status: Married    Spouse name: N/A  . Number of children: 0  . Years of education: N/A   Occupational History  . Not on file.   Social History Main Topics  . Smoking status: Never Smoker  . Smokeless tobacco: Never Used  . Alcohol use No  . Drug use: No  . Sexual activity: Yes    Birth control/ protection: None     Comment: last sex Jan 19   Other Topics Concern  . Not on file   Social History Narrative  . No narrative on file   No current facility-administered medications on file prior to encounter.    No current  outpatient prescriptions on file prior to encounter.   Allergies  Allergen Reactions  . Depakote [Divalproex Sodium] Other (See Comments)    Pt states that this medication makes her blood levels toxic.    [name] Methylphenidate Derivatives Other (See Comments)    Reaction:  Depression and anger   . Neurontin [Gabapentin] Other (See Comments)    Reaction:  Dizziness   . Prozac [Fluoxetine Hcl] Other (See Comments)    Reaction:  Anger     ROS:  Review of Systems  Constitutional: Negative for chills, fatigue and fever.  Respiratory: Negative for shortness of breath.   Cardiovascular: Negative for chest pain.  Gastrointestinal: Positive for abdominal pain. Negative for constipation, diarrhea and nausea.  Genitourinary: Positive for flank pain. Negative for difficulty urinating, dysuria, pelvic pain, vaginal bleeding, vaginal discharge and vaginal pain.  Musculoskeletal: Negative for back pain.  Neurological: Negative for dizziness and headaches.  Psychiatric/Behavioral: Negative.      I have reviewed patient's Past Medical Hx, Surgical Hx, Family Hx, Social Hx, medications and allergies.   Physical Exam   Patient Vitals for the past 24 hrs:  BP Temp Temp src Pulse Resp SpO2 Weight  07/10/17 1535 129/82 98.9 F (37.2 C) Oral 95 18 100 % 201 lb 12 oz (91.5 kg)   Constitutional:  Well-developed, well-nourished female in no acute distress.  Cardiovascular: normal rate Respiratory: normal effort GI: Abd soft, non-tender. Pos BS x 4 MS: Extremities nontender, no edema, normal ROM Neurologic: Alert and oriented x 4.  GU: Neg CVAT.  PELVIC EXAM: Deferred   LAB RESULTS Results for orders placed or performed during the hospital encounter of 07/10/17 (from the past 24 hour(s))  Urinalysis, Routine w reflex microscopic     Status: Abnormal   Collection Time: 07/10/17  3:38 PM  Result Value Ref Range   Color, Urine YELLOW YELLOW   APPearance CLEAR CLEAR   Specific Gravity, Urine 1.025  1.005 - 1.030   pH 5.0 5.0 - 8.0   Glucose, UA NEGATIVE NEGATIVE mg/dL   Hgb urine dipstick NEGATIVE NEGATIVE   Bilirubin Urine NEGATIVE NEGATIVE   Ketones, ur NEGATIVE NEGATIVE mg/dL   Protein, ur 30 (A) NEGATIVE mg/dL   Nitrite NEGATIVE NEGATIVE   Leukocytes, UA TRACE (A) NEGATIVE   RBC / HPF 0-5 0 - 5 RBC/hpf   WBC, UA 0-5 0 - 5 WBC/hpf   Bacteria, UA RARE (A) NONE SEEN   Squamous Epithelial / LPF 6-30 (A) NONE SEEN   Mucous PRESENT     --/--/O POS (02/19 1907)  IMAGING  US Ob Transvaginal  Result Date: 07/09/2017 CLINICAL DATA:  Abdominal pain in first trimester of pregnancy, followup EXAM: TRANSVAGINAL OB ULTRASOUND TECHNIQUE: Transvaginal ultrasound was performed for complete evaluation of the gestation as well as the maternal uterus, adnexal regions, and pelvic cul-de-sac. COMPARISON:  None. FINDINGS: Intrauterine gestational sac: Present Yolk sac:  Present Embryo:  Not visualize Cardiac Activity: N/A Heart Rate: N/A bpm MSD: 9.7  mm   5 w   5  d Subchorionic hemorrhage:  None Maternal uterus/adnexae: RIGHT ovary normal size and morphology, 2.2 x 3.7 x 1.8 cm. LEFT ovary not visualized question obscured by bowel. Trace free pelvic fluid. IMPRESSION: Intrauterine gestational sac containing a yolk sac but no fetal pole is identified. Unable to establish viability of the pregnancy ; viability could be established by followup ultrasound in 14 days if clinically indicated. Electronically Signed   By: Lavonia Dana M.D.   On: 07/09/2017 15:21   US Ob Transvaginal  Result Date: 07/04/2017 CLINICAL DATA:  Threatened abortion in early pregnancy EXAM: OBSTETRIC <14 WK [place] AND TRANSVAGINAL OB US TECHNIQUE: Both transabdominal and transvaginal ultrasound examinations were performed for complete evaluation of the gestation as well as the maternal uterus, adnexal regions, and pelvic cul-de-sac. Transvaginal technique was performed to assess early pregnancy. COMPARISON:  None. FINDINGS: Intrauterine  gestational sac: Single Yolk sac:  Visualized. Embryo:  Not Visualized. Cardiac Activity: Not Visualized. Heart Rate: Not applicable MSD: 3.6  mm   5 w   0  d Subchorionic hemorrhage:  None visualized. Maternal uterus/adnexae: Corpus luteum noted of the right ovary. Normal left ovary. IMPRESSION: Probable early intrauterine gestational sa sac and yolk sac but no fetal pole or cardiac activity yet visualized. Recommend follow-up quantitative B-HCG levels and follow-up US in 14 days to assess viability. This recommendation follows SRU consensus guidelines: Diagnostic Criteria for Nonviable Pregnancy Early in the First Trimester. [place] [employer] Med 2013; 620:3559-74. Electronically Signed   By: Ashley Royalty M.D.   On: 07/04/2017 18:58    MAU Management/MDM: Ordered labs and reviewed results.  No evidence of UTI but urine sent for culture with trace leukocytes today.  Flank pain reproduced on palpation so may be musculoskeletal pain vs acid reflux which is in  pt hx.  Recommend increase PO fluids, OTC management of acid reflux with Tums or Zantac PRN.    Reviewed [place] results from yesterday. IUP still visualized with appropriate growth from previous US 5 days before.  Gestational sac, yolk sac visualized but no fetal pole.  Discussed results with pt today and presented US findings as indication of normal IUP.  Questions answered.  Outpatient [place] ordered for 1 week, pt requests to follow up with me, so [place] ordered for Thursday, 07/16/17.  Return to MAU as needed for emergencies. Pt stable at time of discharge.  ASSESSMENT 1. Normal IUP (intrauterine pregnancy) on prenatal ultrasound, first trimester   2. Abdominal pain during pregnancy in first trimester   3. Left flank pain     PLAN Discharge home   Allergies as of 07/10/2017      Reactions   Depakote [divalproex Sodium] Other (See Comments)   Pt states that this medication makes her blood levels toxic.     Methylphenidate Derivatives Other (See Comments)    Reaction:  Depression and anger    Neurontin [gabapentin] Other (See Comments)   Reaction:  Dizziness    Prozac [fluoxetine Hcl] Other (See Comments)   Reaction:  Anger       Medication List    STOP taking these medications   amLODipine 5 MG tablet Commonly known as:  NORVASC   ferrous gluconate 240 (27 FE) MG tablet Commonly known as:  IRON 27   ibuprofen 600 MG tablet Commonly known as:  ADVIL,MOTRIN   oseltamivir 75 MG capsule Commonly known as:  TAMIFLU     TAKE these medications   chlorproMAZINE 25 MG tablet Commonly known as:  THORAZINE Take 25 mg by mouth 3 (three) times daily.      Follow-up [facility] Follow up.   Specialty:  Radiology Why:  Ultrasound will call you to schedule appointment for Thursday, 07/16/17.   Contact information: [address] [id number] mc [place] for [place] Follow up.   Specialty:  Obstetrics and Gynecology Why:  You will follow up with Fatima Blank, CNM, in the office after your ultrasound on Thursday, 07/16/17.  Return to MAU as needed for emergencies. Contact information: [place] Certified Nurse-Midwife 07/10/2017  5:12 PM

## 2017-07-11 LAB — CULTURE, OB URINE: Culture: >=100000 — AB

## 2017-07-13 ENCOUNTER — Telehealth: Payer: Self-pay | Admitting: *Deleted

## 2017-07-13 NOTE — Telephone Encounter (Addendum)
Called pt and left message that I am returning her call from earlier today. I will call back tomorrow. She may leave a message if desired stating that best time to reach her.   7/31  0930  Spoke with pt and discussed her recent US results as well as recent MAU visit. I informed her that I had misread the information contained within the ultrasound worksheet when she was here to receive results on 7/26. The final report indicates a progression of the pregnancy as she was told by Fatima Blank, CNM on 7/27. I sincerely apologized for the confusion. I confirmed that by [place] she is 6w 3d today however we have not yet seen the actual baby because it is so early in the pregnancy. She will need to keep the appt as scheduled for ultrasound on 8/2 @ 3:45. Hopefully we will see the actual embryo on that Cinderella Christoffersen and can confirm a normal progression. She will receive the results in our office by Lattie Haw after the ultrasound. Pt voiced understanding of all information and agreed to appt on 8/2.

## 2017-07-16 ENCOUNTER — Ambulatory Visit (HOSPITAL_COMMUNITY): Payer: Medicare Other

## 2017-07-16 ENCOUNTER — Ambulatory Visit (INDEPENDENT_AMBULATORY_CARE_PROVIDER_SITE_OTHER): Payer: Medicare Other | Admitting: Advanced Practice Midwife

## 2017-07-16 ENCOUNTER — Ambulatory Visit (HOSPITAL_COMMUNITY)
Admission: RE | Admit: 2017-07-16 | Discharge: 2017-07-16 | Disposition: A | Discharge status: Home / Self Care | Payer: Medicare Other | Source: Ambulatory Visit | Attending: Advanced Practice Midwife | Admitting: Advanced Practice Midwife

## 2017-07-16 DIAGNOSIS — Z3A01 Less than 8 weeks gestation of pregnancy: Secondary | ICD-10-CM | POA: Diagnosis not present

## 2017-07-16 DIAGNOSIS — Z3481 Encounter for supervision of other normal pregnancy, first trimester: Secondary | ICD-10-CM

## 2017-07-16 DIAGNOSIS — R109 Unspecified abdominal pain: Secondary | ICD-10-CM | POA: Diagnosis not present

## 2017-07-16 DIAGNOSIS — O0970 Supervision of high risk pregnancy due to social problems, unspecified trimester: Secondary | ICD-10-CM | POA: Insufficient documentation

## 2017-07-16 DIAGNOSIS — Z3491 Encounter for supervision of normal pregnancy, unspecified, first trimester: Secondary | ICD-10-CM

## 2017-07-16 DIAGNOSIS — O219 Vomiting of pregnancy, unspecified: Secondary | ICD-10-CM

## 2017-07-16 DIAGNOSIS — O26891 Other specified pregnancy related conditions, first trimester: Secondary | ICD-10-CM | POA: Diagnosis present

## 2017-07-16 DIAGNOSIS — O0971 Supervision of high risk pregnancy due to social problems, first trimester: Secondary | ICD-10-CM

## 2017-07-16 MED ORDER — PROMETHAZINE HCL 25 MG PO TABS: 12.5000 mg | ORAL_TABLET | Freq: Four times a day (QID) | ORAL | 5 refills | Status: DC | PRN

## 2017-07-16 NOTE — Progress Notes (Signed)
Ultrasounds Results Note  SUBJECTIVE HPI:  [name] is a 31 y.o. [id number] at [redacted]w[redacted]d by LMP who presents to the [facility] for followup ultrasound results. The patient denies abdominal pain or vaginal bleeding.  Upon review of the patient's records, patient was first seen in MAU on 7/21 for bleeding and cramping.   Ultrasound showed early IUP with GS and YS.  Follow up US in 5 days on 7/26 showed some growth but no significant change from previous US.     Past Medical History:  Diagnosis Date  . Anxiety   . Asthma    inhaler PRN-hasn't used inhaler in months  . Attention deficit hyperactivity disorder   . Bipolar disorder [facility])    hospitalized as a teen for suicidal ideations and cutting  . Depression   . Gastritis   . GERD (gastroesophageal reflux disease)   . HPV (human papilloma virus) anogenital infection   . Hx of varicella   . Mild preeclampsia 02/09/2016  . Vaginal Pap smear, abnormal    Past Surgical History:  Procedure Laterality Date  . EYE MUSCLE SURGERY Bilateral    07/29/12  . MOUTH SURGERY    . PLANTAR FASCIA SURGERY    . WISDOM TOOTH EXTRACTION     Social History   Social History  . Marital status: Married    Spouse name: N/A  . Number of children: 0  . Years of education: N/A   Occupational History  . Not on file.   Social History Main Topics  . Smoking status: Never Smoker  . Smokeless tobacco: Never Used  . Alcohol use No  . Drug use: No  . Sexual activity: Yes    Birth control/ protection: None     Comment: last sex Jan 19   Other Topics Concern  . Not on file   Social History Narrative  . No narrative on file   Current Outpatient Prescriptions on File Prior to Visit  Medication Sig Dispense Refill  . chlorproMAZINE (THORAZINE) 25 MG tablet Take 25 mg by mouth 3 (three) times daily.     No current facility-administered medications on file prior to visit.    Allergies  Allergen Reactions  . Depakote [Divalproex  Sodium] Other (See Comments)    Pt states that this medication makes her blood levels toxic.    [name] Methylphenidate Derivatives Other (See Comments)    Reaction:  Depression and anger   . Neurontin [Gabapentin] Other (See Comments)    Reaction:  Dizziness   . Prozac [Fluoxetine Hcl] Other (See Comments)    Reaction:  Anger     I have reviewed patient's Past Medical Hx, Surgical Hx, Family Hx, Social Hx, medications and allergies.   Review of Systems Review of Systems  Constitutional: Negative for fever and chills.  Gastrointestinal: Negative for nausea, vomiting, abdominal pain, diarrhea and constipation.  Genitourinary: Negative for dysuria.  Musculoskeletal: Negative for back pain.  Neurological: Negative for dizziness and weakness.    Physical Exam  There were no vitals taken for this visit.  GENERAL: Well-developed, well-nourished female in no acute distress.  HEENT: Normocephalic, atraumatic.   LUNGS: Effort normal ABDOMEN: soft, non-tender HEART: Regular rate  SKIN: Warm, dry and without erythema PSYCH: Normal mood and affect NEURO: Alert and oriented x 4  LAB RESULTS No results found for this or any previous visit (from the past 24 hour(s)).  IMAGING US Ob Comp Less 14 Wks   US Ob Transvaginal  Result Date:  07/16/2017 CLINICAL DATA:  First trimester pregnancy with inconclusive fetal viability. EXAM: TRANSVAGINAL OB ULTRASOUND TECHNIQUE: Transvaginal ultrasound was performed for complete evaluation of the gestation as well as the maternal uterus, adnexal regions, and pelvic cul-de-sac. COMPARISON:  07/09/2017 FINDINGS: Intrauterine gestational sac: Single Yolk sac:  Visualized. Embryo:  Visualized. Cardiac Activity: Visualized. Heart Rate: 120 bpm CRL:  5 mm   6 w 1 d                  [place] EDC: 03/10/2018 Subchorionic hemorrhage:  Small subchorionic hemorrhage noted. Maternal uterus/adnexae: Normal appearance of both ovaries. No pelvic mass or free fluid identified.  IMPRESSION: Single living IUP measuring 6 weeks 1 day, with [place] EDC of 03/10/2018. Small subchorionic hemorrhage noted. Electronically Signed   By: Earle Gell M.D.   On: 07/16/2017 15:32      ASSESSMENT 1. Normal IUP (intrauterine pregnancy) on prenatal ultrasound, first trimester   2. Nausea and vomiting during pregnancy prior to [redacted] weeks gestation     PLAN Discharge home in stable condition Rx for Phenergan 12.5-25 mg PO Q 6 hours PRN Patient advised to start/continue taking prenatal vitamins Keep appt scheduled at [facility]  Elvera Maria, CNM  07/16/2017  4:23 PM

## 2017-07-16 NOTE — Patient Instructions (Signed)
First Trimester of Pregnancy The first trimester of pregnancy is from week 1 until the end of week 13 (months 1 through 3). A week after a sperm fertilizes an egg, the egg will implant on the wall of the uterus. This embryo will begin to develop into a baby. Genes from you and your partner will form the baby. The female genes will determine whether the baby will be a boy or a girl. At 6-8 weeks, the eyes and face will be formed, and the heartbeat can be seen on ultrasound. At the end of 12 weeks, all the baby's organs will be formed. Now that you are pregnant, you will want to do everything you can to have a healthy baby. Two of the most important things are to get good prenatal care and to follow your health care provider's instructions. Prenatal care is all the medical care you receive before the baby's birth. This care will help prevent, find, and treat any problems during the pregnancy and childbirth. Body changes during your first trimester Your body goes through many changes during pregnancy. The changes vary from woman to woman.  You may gain or lose a couple of pounds at first.  You may feel sick to your stomach (nauseous) and you may throw up (vomit). If the vomiting is uncontrollable, call your health care provider.  You may tire easily.  You may develop headaches that can be relieved by medicines. All medicines should be approved by your health care provider.  You may urinate more often. Painful urination may mean you have a bladder infection.  You may develop heartburn as a result of your pregnancy.  You may develop constipation because certain hormones are causing the muscles that push stool through your intestines to slow down.  You may develop hemorrhoids or swollen veins (varicose veins).  Your breasts may begin to grow larger and become tender. Your nipples may stick out more, and the tissue that surrounds them (areola) may become darker.  Your gums may bleed and may be  sensitive to brushing and flossing.  Dark spots or blotches (chloasma, mask of pregnancy) may develop on your face. This will likely fade after the baby is born.  Your menstrual periods will stop.  You may have a loss of appetite.  You may develop cravings for certain kinds of food.  You may have changes in your emotions from day to day, such as being excited to be pregnant or being concerned that something may go wrong with the pregnancy and baby.  You may have more vivid and strange dreams.  You may have changes in your hair. These can include thickening of your hair, rapid growth, and changes in texture. Some women also have hair loss during or after pregnancy, or hair that feels dry or thin. Your hair will most likely return to normal after your baby is born.  What to expect at prenatal visits During a routine prenatal visit:  You will be weighed to make sure you and the baby are growing normally.  Your blood pressure will be taken.  Your abdomen will be measured to track your baby's growth.  The fetal heartbeat will be listened to between weeks 10 and 14 of your pregnancy.  Test results from any previous visits will be discussed.  Your health care provider may ask you:  How you are feeling.  If you are feeling the baby move.  If you have had any abnormal symptoms, such as leaking fluid, bleeding, severe headaches,   or abdominal cramping.  If you are using any tobacco products, including cigarettes, chewing tobacco, and electronic cigarettes.  If you have any questions.  Other tests that may be performed during your first trimester include:  Blood tests to find your blood type and to check for the presence of any previous infections. The tests will also be used to check for low iron levels (anemia) and protein on red blood cells (Rh antibodies). Depending on your risk factors, or if you previously had diabetes during pregnancy, you may have tests to check for high blood  sugar that affects pregnant women (gestational diabetes).  Urine tests to check for infections, diabetes, or protein in the urine.  An ultrasound to confirm the proper growth and development of the baby.  Fetal screens for spinal cord problems (spina bifida) and Down syndrome.  HIV (human immunodeficiency virus) testing. Routine prenatal testing includes screening for HIV, unless you choose not to have this test.  You may need other tests to make sure you and the baby are doing well.  Follow these instructions at home: Medicines  Follow your health care provider's instructions regarding medicine use. Specific medicines may be either safe or unsafe to take during pregnancy.  Take a prenatal vitamin that contains at least 600 micrograms (mcg) of folic acid.  If you develop constipation, try taking a stool softener if your health care provider approves. Eating and drinking  Eat a balanced diet that includes fresh fruits and vegetables, whole grains, good sources of protein such as meat, eggs, or tofu, and low-fat dairy. Your health care provider will help you determine the amount of weight gain that is right for you.  Avoid raw meat and uncooked cheese. These carry germs that can cause birth defects in the baby.  Eating four or five small meals rather than three large meals a day may help relieve nausea and vomiting. If you start to feel nauseous, eating a few soda crackers can be helpful. Drinking liquids between meals, instead of during meals, also seems to help ease nausea and vomiting.  Limit foods that are high in fat and processed sugars, such as fried and sweet foods.  To prevent constipation: ? Eat foods that are high in fiber, such as fresh fruits and vegetables, whole grains, and beans. ? Drink enough fluid to keep your urine clear or pale yellow. Activity  Exercise only as directed by your health care provider. Most women can continue their usual exercise routine during  pregnancy. Try to exercise for 30 minutes at least 5 days a week. Exercising will help you: ? Control your weight. ? Stay in shape. ? Be prepared for labor and delivery.  Experiencing pain or cramping in the lower abdomen or lower back is a good sign that you should stop exercising. Check with your health care provider before continuing with normal exercises.  Try to avoid standing for long periods of time. Move your legs often if you must stand in one place for a long time.  Avoid heavy lifting.  Wear low-heeled shoes and practice good posture.  You may continue to have sex unless your health care provider tells you not to. Relieving pain and discomfort  Wear a good support bra to relieve breast tenderness.  Take warm sitz baths to soothe any pain or discomfort caused by hemorrhoids. Use hemorrhoid cream if your health care provider approves.  Rest with your legs elevated if you have leg cramps or low back pain.  If you develop   varicose veins in your legs, wear support hose. Elevate your feet for 15 minutes, 3-4 times a day. Limit salt in your diet. Prenatal care  Schedule your prenatal visits by the twelfth week of pregnancy. They are usually scheduled monthly at first, then more often in the last 2 months before delivery.  Write down your questions. Take them to your prenatal visits.  Keep all your prenatal visits as told by your health care provider. This is important. Safety  Wear your seat belt at all times when driving.  Make a list of emergency phone numbers, including numbers for family, friends, the hospital, and police and fire departments. General instructions  Ask your health care provider for a referral to a local prenatal education class. Begin classes no later than the beginning of month 6 of your pregnancy.  Ask for help if you have counseling or nutritional needs during pregnancy. Your health care provider can offer advice or refer you to specialists for help  with various needs.  Do not use hot tubs, steam rooms, or saunas.  Do not douche or use tampons or scented sanitary pads.  Do not cross your legs for long periods of time.  Avoid cat litter boxes and soil used by cats. These carry germs that can cause birth defects in the baby and possibly loss of the fetus by miscarriage or stillbirth.  Avoid all smoking, herbs, alcohol, and medicines not prescribed by your health care provider. Chemicals in these products affect the formation and growth of the baby.  Do not use any products that contain nicotine or tobacco, such as cigarettes and e-cigarettes. If you need help quitting, ask your health care provider. You may receive counseling support and other resources to help you quit.  Schedule a dentist appointment. At home, brush your teeth with a soft toothbrush and be gentle when you floss. Contact a health care provider if:  You have dizziness.  You have mild pelvic cramps, pelvic pressure, or nagging pain in the abdominal area.  You have persistent nausea, vomiting, or diarrhea.  You have a bad smelling vaginal discharge.  You have pain when you urinate.  You notice increased swelling in your face, hands, legs, or ankles.  You are exposed to fifth disease or chickenpox.  You are exposed to German measles (rubella) and have never had it. Get help right away if:  You have a fever.  You are leaking fluid from your vagina.  You have spotting or bleeding from your vagina.  You have severe abdominal cramping or pain.  You have rapid weight gain or loss.  You vomit blood or material that looks like coffee grounds.  You develop a severe headache.  You have shortness of breath.  You have any kind of trauma, such as from a fall or a car accident. Summary  The first trimester of pregnancy is from week 1 until the end of week 13 (months 1 through 3).  Your body goes through many changes during pregnancy. The changes vary from  woman to woman.  You will have routine prenatal visits. During those visits, your health care provider will examine you, discuss any test results you may have, and talk with you about how you are feeling. This information is not intended to replace advice given to you by your health care provider. Make sure you discuss any questions you have with your health care provider. Document Released: 11/25/2001 Document Revised: 11/12/2016 Document Reviewed: 11/12/2016 Elsevier Interactive Patient Education  2017 Elsevier   Inc[employer]  

## 2017-07-24 ENCOUNTER — Other Ambulatory Visit: Payer: Self-pay | Admitting: Obstetrics

## 2017-07-24 ENCOUNTER — Ambulatory Visit (HOSPITAL_COMMUNITY): Payer: Medicare Other

## 2017-07-24 ENCOUNTER — Other Ambulatory Visit: Payer: Self-pay | Admitting: Certified Nurse Midwife

## 2017-07-29 ENCOUNTER — Encounter: Payer: Medicare Other | Admitting: Obstetrics & Gynecology

## 2017-08-18 ENCOUNTER — Other Ambulatory Visit: Payer: Self-pay | Admitting: Certified Nurse Midwife

## 2017-08-21 ENCOUNTER — Encounter (HOSPITAL_COMMUNITY): Payer: Self-pay

## 2017-08-21 ENCOUNTER — Emergency Department (HOSPITAL_COMMUNITY)
Admission: EM | Admit: 2017-08-21 | Discharge: 2017-08-21 | Disposition: A | Discharge status: Home / Self Care | Payer: Medicare Other | Attending: Emergency Medicine | Admitting: Emergency Medicine

## 2017-08-21 DIAGNOSIS — J029 Acute pharyngitis, unspecified: Secondary | ICD-10-CM | POA: Insufficient documentation

## 2017-08-21 DIAGNOSIS — Z5321 Procedure and treatment not carried out due to patient leaving prior to being seen by health care provider: Secondary | ICD-10-CM | POA: Diagnosis not present

## 2017-08-21 LAB — RAPID STREP SCREEN (MED CTR MEBANE ONLY): Streptococcus, Group A Screen (Direct): POSITIVE — AB

## 2017-08-21 NOTE — ED Triage Notes (Addendum)
Patient c/o sore throat and a productive cough with yellow sputum 2 days ago. Patient is [redacted] weeks pregnant.

## 2017-08-21 NOTE — ED Notes (Addendum)
Pt states she does not want the treatment that the EDPA told her.  Pt reports she is [redacted] weeks pregnant.  She is wanting to go to an ATM, made her aware that she cannot leave the room until her care is completed and if she insist on leaving, she will have to sign AMA.  She said she still wants to go and verbalizes that if she gets worse that she will return.   Benjamine Mola EDPA aware

## 2017-08-24 ENCOUNTER — Other Ambulatory Visit: Payer: Self-pay

## 2017-08-24 NOTE — Patient Outreach (Signed)
Outreach patient after ED visit on Sept 8, 2018. I was  unable to make contact with patient but I left a voicemail message asking for a return phone call.  In addition to the the phone call I mailed a unsuccessful letter on today.

## 2017-09-07 ENCOUNTER — Encounter: Payer: Medicare Other | Admitting: Obstetrics and Gynecology

## 2017-09-08 ENCOUNTER — Inpatient Hospital Stay (HOSPITAL_COMMUNITY)
Admission: AD | Admit: 2017-09-08 | Discharge: 2017-09-08 | Disposition: A | Discharge status: Home / Self Care | Payer: Medicare Other | Source: Ambulatory Visit | Attending: Family Medicine | Admitting: Family Medicine

## 2017-09-08 ENCOUNTER — Encounter (HOSPITAL_COMMUNITY): Payer: Self-pay | Admitting: *Deleted

## 2017-09-08 DIAGNOSIS — O99342 Other mental disorders complicating pregnancy, second trimester: Secondary | ICD-10-CM | POA: Insufficient documentation

## 2017-09-08 DIAGNOSIS — R112 Nausea with vomiting, unspecified: Secondary | ICD-10-CM | POA: Diagnosis present

## 2017-09-08 DIAGNOSIS — O99282 Endocrine, nutritional and metabolic diseases complicating pregnancy, second trimester: Secondary | ICD-10-CM | POA: Insufficient documentation

## 2017-09-08 DIAGNOSIS — O219 Vomiting of pregnancy, unspecified: Secondary | ICD-10-CM | POA: Diagnosis not present

## 2017-09-08 DIAGNOSIS — R110 Nausea: Secondary | ICD-10-CM | POA: Diagnosis not present

## 2017-09-08 DIAGNOSIS — R030 Elevated blood-pressure reading, without diagnosis of hypertension: Secondary | ICD-10-CM | POA: Diagnosis not present

## 2017-09-08 DIAGNOSIS — F319 Bipolar disorder, unspecified: Secondary | ICD-10-CM | POA: Diagnosis not present

## 2017-09-08 DIAGNOSIS — E860 Dehydration: Secondary | ICD-10-CM | POA: Diagnosis not present

## 2017-09-08 DIAGNOSIS — Z888 Allergy status to other drugs, medicaments and biological substances status: Secondary | ICD-10-CM | POA: Diagnosis not present

## 2017-09-08 DIAGNOSIS — Z915 Personal history of self-harm: Secondary | ICD-10-CM | POA: Insufficient documentation

## 2017-09-08 DIAGNOSIS — Z3A14 14 weeks gestation of pregnancy: Secondary | ICD-10-CM | POA: Insufficient documentation

## 2017-09-08 LAB — URINALYSIS, ROUTINE W REFLEX MICROSCOPIC
Bilirubin Urine: NEGATIVE
Glucose, UA: NEGATIVE mg/dL
Hgb urine dipstick: NEGATIVE
Ketones, ur: 80 mg/dL — AB
Nitrite: NEGATIVE
Protein, ur: 30 mg/dL — AB
Specific Gravity, Urine: 1.027 (ref 1.005–1.030)
pH: 5 (ref 5.0–8.0)

## 2017-09-08 LAB — COMPREHENSIVE METABOLIC PANEL
ALT: 18 U/L (ref 14–54)
AST: 18 U/L (ref 15–41)
Albumin: 3.4 g/dL — ABNORMAL LOW (ref 3.5–5.0)
Alkaline Phosphatase: 44 U/L (ref 38–126)
Anion gap: 12 (ref 5–15)
BUN: 7 mg/dL (ref 6–20)
CO2: 23 mmol/L (ref 22–32)
Calcium: 8.9 mg/dL (ref 8.9–10.3)
Chloride: 102 mmol/L (ref 101–111)
Creatinine, Ser: 0.64 mg/dL (ref 0.44–1.00)
GFR calc Af Amer: >60 mL/min (ref >60)
GFR calc non Af Amer: >60 mL/min (ref >60)
Glucose, Bld: 92 mg/dL (ref 65–99)
Potassium: 4.1 mmol/L (ref 3.5–5.1)
Sodium: 137 mmol/L (ref 135–145)
Total Bilirubin: 0.9 mg/dL (ref 0.3–1.2)
Total Protein: 6.8 g/dL (ref 6.5–8.1)

## 2017-09-08 LAB — CBC
HCT: 38.5 % (ref 36.0–46.0)
Hemoglobin: 13 g/dL (ref 12.0–15.0)
MCH: 30.1 pg (ref 26.0–34.0)
MCHC: 33.8 g/dL (ref 30.0–36.0)
MCV: 89.1 fL (ref 78.0–100.0)
Platelets: 149 10*3/uL — ABNORMAL LOW (ref 150–400)
RBC: 4.32 MIL/uL (ref 3.87–5.11)
RDW: 13.8 % (ref 11.5–15.5)
WBC: 6.4 10*3/uL (ref 4.0–10.5)

## 2017-09-08 MED ORDER — LACTATED RINGERS IV BOLUS (SEPSIS)
1000.0000 mL | Freq: Once | INTRAVENOUS | Status: AC
  Administered 2017-09-08: 1000 mL via INTRAVENOUS

## 2017-09-08 MED ORDER — FAMOTIDINE IN NACL 20-0.9 MG/50ML-% IV SOLN
20.0000 mg | Freq: Once | INTRAVENOUS | Status: AC
  Administered 2017-09-08: 20 mg via INTRAVENOUS
  Filled 2017-09-08: qty 50

## 2017-09-08 MED ORDER — DEXTROSE 5 % IN LACTATED RINGERS IV BOLUS
1000.0000 mL | Freq: Once | INTRAVENOUS | Status: AC
  Administered 2017-09-08: 1000 mL via INTRAVENOUS

## 2017-09-08 MED ORDER — ONDANSETRON 4 MG PO TBDP: 4.0000 mg | ORAL_TABLET | Freq: Three times a day (TID) | ORAL | 0 refills | Status: DC | PRN

## 2017-09-08 MED ORDER — PROMETHAZINE HCL 25 MG/ML IJ SOLN
25.0000 mg | Freq: Four times a day (QID) | INTRAMUSCULAR | Status: DC | PRN
  Administered 2017-09-08: 25 mg via INTRAVENOUS
  Filled 2017-09-08: qty 1
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
## 2017-09-08 NOTE — MAU Note (Signed)
Radiology called and stated the CT tech will arrive around 0315.
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
## 2017-09-08 NOTE — MAU Note (Signed)
Pt given cab voucher for discharge home.

## 2017-09-08 NOTE — MAU Note (Signed)
Pt presents to MAU via EMS c/o nausea and vomitting x1week. Pt states she can not keep anything down fluids or food. Pt states she had LOF on her leg 3-5days ago and it was clear. Pt denies bleeding

## 2017-09-08 NOTE — MAU Note (Signed)
Urine in lab 

## 2017-09-14 ENCOUNTER — Encounter (HOSPITAL_COMMUNITY): Payer: Self-pay | Admitting: *Deleted

## 2017-09-14 ENCOUNTER — Inpatient Hospital Stay (HOSPITAL_COMMUNITY)
Admission: AD | Admit: 2017-09-14 | Discharge: 2017-09-14 | Disposition: A | Discharge status: Home / Self Care | Payer: Medicare Other | Source: Ambulatory Visit | Attending: Family Medicine | Admitting: Family Medicine

## 2017-09-14 DIAGNOSIS — O219 Vomiting of pregnancy, unspecified: Secondary | ICD-10-CM | POA: Insufficient documentation

## 2017-09-14 DIAGNOSIS — O99282 Endocrine, nutritional and metabolic diseases complicating pregnancy, second trimester: Secondary | ICD-10-CM | POA: Diagnosis not present

## 2017-09-14 DIAGNOSIS — Z888 Allergy status to other drugs, medicaments and biological substances status: Secondary | ICD-10-CM | POA: Insufficient documentation

## 2017-09-14 DIAGNOSIS — Z818 Family history of other mental and behavioral disorders: Secondary | ICD-10-CM | POA: Diagnosis not present

## 2017-09-14 DIAGNOSIS — Z800 Family history of malignant neoplasm of digestive organs: Secondary | ICD-10-CM | POA: Diagnosis not present

## 2017-09-14 DIAGNOSIS — E860 Dehydration: Secondary | ICD-10-CM | POA: Diagnosis not present

## 2017-09-14 DIAGNOSIS — Z8371 Family history of colonic polyps: Secondary | ICD-10-CM | POA: Diagnosis not present

## 2017-09-14 DIAGNOSIS — Z3A15 15 weeks gestation of pregnancy: Secondary | ICD-10-CM

## 2017-09-14 DIAGNOSIS — R112 Nausea with vomiting, unspecified: Secondary | ICD-10-CM | POA: Diagnosis present

## 2017-09-14 LAB — URINALYSIS, ROUTINE W REFLEX MICROSCOPIC
Bilirubin Urine: NEGATIVE
Glucose, UA: NEGATIVE mg/dL
Hgb urine dipstick: NEGATIVE
Ketones, ur: 20 mg/dL — AB
Nitrite: NEGATIVE
Protein, ur: 30 mg/dL — AB
Specific Gravity, Urine: 1.026 (ref 1.005–1.030)
pH: 5 (ref 5.0–8.0)

## 2017-09-14 MED ORDER — METOCLOPRAMIDE HCL 10 MG PO TABS: 10.0000 mg | ORAL_TABLET | Freq: Four times a day (QID) | ORAL | 0 refills | Status: DC | PRN

## 2017-09-14 MED ORDER — METOCLOPRAMIDE HCL 10 MG PO TABS
10.0000 mg | ORAL_TABLET | Freq: Four times a day (QID) | ORAL | Status: DC | PRN
  Administered 2017-09-14: 10 mg via ORAL
  Filled 2017-09-14: qty 1

## 2017-09-14 NOTE — MAU Note (Signed)
Pt here with c/o nausea and vomiting. "Can't keep anything down."

## 2017-09-14 NOTE — MAU Provider Note (Signed)
History     CSN: [id number]  Arrival date and time: 09/14/17 4098  Chief Complaint  Patient presents with  . Nausea  . Emesis   [id number] @15 .2 wks here with N/V. She is unable to tolerate anything po. She cannot recall the last time food or fluid stayed down. She was seen last week for similar sx and given Phenergan. She has been using it 2 times a day as that is what she understood was the dosing. She reports Zofran does not work for her and has not picked up the Rx that was sent. Denies sick contacts. No other pregnancy complaints.   OB History    Gravida Para Term Preterm AB Living   5 2 1 1 2 2    SAB TAB Ectopic Multiple Live Births         0 2      Past Medical History:  Diagnosis Date  . Anxiety   . Asthma    inhaler PRN-hasn't used inhaler in months  . Attention deficit hyperactivity disorder   . Bipolar disorder [facility])    hospitalized as a teen for suicidal ideations and cutting  . Depression   . Gastritis   . GERD (gastroesophageal reflux disease)   . HPV (human papilloma virus) anogenital infection   . Hx of varicella   . Mild preeclampsia 02/09/2016  . Vaginal Pap smear, abnormal     Past Surgical History:  Procedure Laterality Date  . EYE MUSCLE SURGERY Bilateral    07/29/12  . MOUTH SURGERY    . PLANTAR FASCIA SURGERY    . WISDOM TOOTH EXTRACTION      Family History  Problem Relation Age of Onset  . Depression Mother   . Colon cancer Mother   . Colon polyps Mother   . Depression Father   . Depression Brother   . Liver disease Unknown   . Kidney disease Unknown     Social History  Substance Use Topics  . Smoking status: Never Smoker  . Smokeless tobacco: Never Used  . Alcohol use No    Allergies:  Allergies  Allergen Reactions  . Depakote [Divalproex Sodium] Other (See Comments)    Pt states that this medication makes her blood levels toxic.    [name] Methylphenidate Derivatives Other (See Comments)    Reaction:  Depression and anger   .  Neurontin [Gabapentin] Other (See Comments)    Reaction:  Dizziness   . Prozac [Fluoxetine Hcl] Other (See Comments)    Reaction:  Anger     No prescriptions prior to admission.    Review of Systems  Constitutional: Negative for fever.  Gastrointestinal: Positive for constipation, nausea and vomiting.   Physical Exam   Blood pressure 133/68, pulse 79, temperature 98.3 F (36.8 C), temperature source Oral, resp. rate 17, height 5\' 4"  (1.626 m), weight 189 lb (85.7 kg), SpO2 98 %, unknown if currently breastfeeding.  Physical Exam  Constitutional: She is oriented to person, place, and time. She appears well-developed and well-nourished. No distress.  HENT:  Head: Normocephalic and atraumatic.  Neck: Normal range of motion.  Cardiovascular: Normal rate.   Respiratory: Effort normal.  Musculoskeletal: Normal range of motion.  Neurological: She is alert and oriented to person, place, and time.  Skin: Skin is warm and dry.  Psychiatric: She has a normal mood and affect.  FHT 152  Results for orders placed or performed during the hospital encounter of 09/14/17 (from the past 24 hour(s))  Urinalysis, Routine w  reflex microscopic     Status: Abnormal   Collection Time: 09/14/17  3:50 AM  Result Value Ref Range   Color, Urine YELLOW YELLOW   APPearance HAZY (A) CLEAR   Specific Gravity, Urine 1.026 1.005 - 1.030   pH 5.0 5.0 - 8.0   Glucose, UA NEGATIVE NEGATIVE mg/dL   Hgb urine dipstick NEGATIVE NEGATIVE   Bilirubin Urine NEGATIVE NEGATIVE   Ketones, ur 20 (A) NEGATIVE mg/dL   Protein, ur 30 (A) NEGATIVE mg/dL   Nitrite NEGATIVE NEGATIVE   Leukocytes, UA MODERATE (A) NEGATIVE   RBC / HPF 0-5 0 - 5 RBC/hpf   WBC, UA 6-30 0 - 5 WBC/hpf   Bacteria, UA FEW (A) NONE SEEN   Squamous Epithelial / LPF 6-30 (A) NONE SEEN   Mucus PRESENT    MAU Course  Procedures Reglan 10 mg po  MDM Labs ordered and reviewed. Mild dehydration noted. No emesis. Tolerating po liquids, declines  food. Stable for discharge home.  Assessment and Plan   1. [redacted] weeks gestation of pregnancy   2. Nausea/vomiting in pregnancy   3. Dehydration    Discharge home Follow up in OB office as scheduled Rx Reglan  Allergies as of 09/14/2017      Reactions   Depakote [divalproex Sodium] Other (See Comments)   Pt states that this medication makes her blood levels toxic.     Methylphenidate Derivatives Other (See Comments)   Reaction:  Depression and anger    Neurontin [gabapentin] Other (See Comments)   Reaction:  Dizziness    Prozac [fluoxetine Hcl] Other (See Comments)   Reaction:  Anger       Medication List    STOP taking these medications   chlorproMAZINE 25 MG tablet Commonly known as:  THORAZINE   ondansetron 4 MG disintegrating tablet Commonly known as:  ZOFRAN ODT   promethazine 25 MG tablet Commonly known as:  PHENERGAN     TAKE these medications   metoCLOPramide 10 MG tablet Commonly known as:  REGLAN Take 1 tablet (10 mg total) by mouth every 6 (six) hours as needed for nausea or vomiting.            Discharge Care Instructions        Start     Ordered   09/14/17 0000  metoCLOPramide (REGLAN) 10 MG tablet  Every 6 hours PRN    Question:  Supervising Provider  Answer:  Donnamae Jude   09/14/17 0525   09/14/17 0000  Discharge patient    Question Answer Comment  Discharge disposition 01-Home or Self Care   Discharge patient date 09/14/2017      09/14/17 [place], CNM 09/14/2017, 6:57 AM

## 2017-09-14 NOTE — Discharge Instructions (Signed)
Morning Sickness °Morning sickness is when you feel sick to your stomach (nauseous) during pregnancy. You may feel sick to your stomach and throw up (vomit). You may feel sick in the morning, but you can feel this way any time of day. Some women feel very sick to their stomach and cannot stop throwing up (hyperemesis gravidarum). °Follow these instructions at home: °· Only take medicines as told by your doctor. °· Take multivitamins as told by your doctor. Taking multivitamins before getting pregnant can stop or lessen the harshness of morning sickness. °· Eat dry toast or unsalted crackers before getting out of bed. °· Eat 5 to 6 small meals a day. °· Eat dry and bland foods like rice and baked potatoes. °· Do not drink liquids with meals. Drink between meals. °· Do not eat greasy, fatty, or spicy foods. °· Have someone cook for you if the smell of food causes you to feel sick or throw up. °· If you feel sick to your stomach after taking prenatal vitamins, take them at night or with a snack. °· Eat protein when you need a snack (nuts, yogurt, cheese). °· Eat unsweetened gelatins for dessert. °· Wear a bracelet used for sea sickness (acupressure wristband). °· Go to a doctor that puts thin needles into certain body points (acupuncture) to improve how you feel. °· Do not smoke. °· Use a humidifier to keep the air in your house free of odors. °· Get lots of fresh air. °Contact a doctor if: °· You need medicine to feel better. °· You feel dizzy or lightheaded. °· You are losing weight. °Get help right away if: °· You feel very sick to your stomach and cannot stop throwing up. °· You pass out (faint). °This information is not intended to replace advice given to you by your health care provider. Make sure you discuss any questions you have with your health care provider. °Document Released: 01/08/2005 Document Revised: 05/08/2016 Document Reviewed: 05/18/2013 °Elsevier Interactive Patient Education © 2017 Elsevier  Inc. ° °

## 2017-10-03 ENCOUNTER — Encounter (HOSPITAL_COMMUNITY): Payer: Self-pay | Admitting: *Deleted

## 2017-10-03 ENCOUNTER — Inpatient Hospital Stay (HOSPITAL_COMMUNITY)
Admission: AD | Admit: 2017-10-03 | Discharge: 2017-10-03 | Disposition: A | Discharge status: Home / Self Care | Payer: Medicare Other | Source: Ambulatory Visit | Attending: Obstetrics and Gynecology | Admitting: Obstetrics and Gynecology

## 2017-10-03 DIAGNOSIS — O210 Mild hyperemesis gravidarum: Secondary | ICD-10-CM | POA: Insufficient documentation

## 2017-10-03 DIAGNOSIS — O368120 Decreased fetal movements, second trimester, not applicable or unspecified: Secondary | ICD-10-CM | POA: Diagnosis present

## 2017-10-03 DIAGNOSIS — O26892 Other specified pregnancy related conditions, second trimester: Secondary | ICD-10-CM | POA: Diagnosis not present

## 2017-10-03 DIAGNOSIS — Z3A18 18 weeks gestation of pregnancy: Secondary | ICD-10-CM | POA: Insufficient documentation

## 2017-10-03 DIAGNOSIS — K920 Hematemesis: Secondary | ICD-10-CM | POA: Diagnosis not present

## 2017-10-03 DIAGNOSIS — R109 Unspecified abdominal pain: Secondary | ICD-10-CM

## 2017-10-03 DIAGNOSIS — O0972 Supervision of high risk pregnancy due to social problems, second trimester: Secondary | ICD-10-CM

## 2017-10-03 LAB — URINALYSIS, MICROSCOPIC (REFLEX)

## 2017-10-03 LAB — URINALYSIS, ROUTINE W REFLEX MICROSCOPIC
Glucose, UA: NEGATIVE mg/dL
Ketones, ur: 40 mg/dL — AB
Nitrite: NEGATIVE
Protein, ur: NEGATIVE mg/dL
Specific Gravity, Urine: 1.025 (ref 1.005–1.030)
pH: 6 (ref 5.0–8.0)

## 2017-10-03 NOTE — MAU Note (Signed)
Pt presents with c/o sore throat, general weakness, N&V, vomited 3x in pat 24 hours.

## 2017-10-03 NOTE — MAU Provider Note (Signed)
History   [id number] @ 18wks in with c/o decreased fetal movement , abd pain, and nausea.denies vag bleeding or ROM  CSN: [id number]  Arrival date & time 10/03/17  1056   None     Chief Complaint  Patient presents with  . Sore Throat  . Weakness  . Nausea  . Emesis    HPI  Past Medical History:  Diagnosis Date  . Anxiety   . Asthma    inhaler PRN-hasn't used inhaler in months  . Attention deficit hyperactivity disorder   . Bipolar disorder [facility])    hospitalized as a teen for suicidal ideations and cutting  . Depression   . Gastritis   . GERD (gastroesophageal reflux disease)   . HPV (human papilloma virus) anogenital infection   . Hx of varicella   . Mild preeclampsia 02/09/2016  . Vaginal Pap smear, abnormal     Past Surgical History:  Procedure Laterality Date  . EYE MUSCLE SURGERY Bilateral    07/29/12  . MOUTH SURGERY    . PLANTAR FASCIA SURGERY    . WISDOM TOOTH EXTRACTION      Family History  Problem Relation Age of Onset  . Depression Mother   . Colon cancer Mother   . Colon polyps Mother   . Depression Father   . Depression Brother   . Liver disease Unknown   . Kidney disease Unknown     Social History  Substance Use Topics  . Smoking status: Never Smoker  . Smokeless tobacco: Never Used  . Alcohol use No    OB History    Gravida Para Term Preterm AB Living   5 2 1 1 2 2    SAB TAB Ectopic Multiple Live Births         0 2      Review of Systems  Constitutional: Negative.   HENT: Negative.   Eyes: Negative.   Respiratory: Negative.   Cardiovascular: Negative.   Gastrointestinal: Positive for abdominal pain and nausea.  Endocrine: Negative.   Genitourinary: Negative.   Musculoskeletal: Negative.   Skin: Negative.   Allergic/Immunologic: Negative.   Neurological: Negative.   Hematological: Negative.   Psychiatric/Behavioral: Negative.     Allergies  Depakote [divalproex sodium]; Methylphenidate derivatives; Neurontin  [gabapentin]; and Prozac [fluoxetine hcl]  Home Medications    BP 108/62 (BP Location: Left Arm)   Pulse 93   Temp (P) 98.7 F (37.1 C) (Oral)   Resp (P) 20   SpO2 98%   Physical Exam  Constitutional: She is oriented to person, place, and time. She appears well-developed and well-nourished.  HENT:  Head: Normocephalic.  Neck: Normal range of motion.  Cardiovascular: Normal rate, regular rhythm, normal heart sounds and intact distal pulses.   Pulmonary/Chest: Effort normal and breath sounds normal.  Abdominal: Soft. Bowel sounds are normal.  Musculoskeletal: Normal range of motion.  Neurological: She is alert and oriented to person, place, and time. She has normal reflexes.  Skin: Skin is warm and dry.  Psychiatric: She has a normal mood and affect. Her behavior is normal. Judgment and thought content normal.    MAU Course  Procedures (including critical care time)  Labs Reviewed  URINALYSIS, ROUTINE W REFLEX MICROSCOPIC - Abnormal; Notable for the following:       Result Value   APPearance HAZY (*)    Hgb urine dipstick SMALL (*)    Bilirubin Urine SMALL (*)    Ketones, ur 40 (*)    Leukocytes, UA SMALL (*)  All other components within normal limits  URINALYSIS, MICROSCOPIC (REFLEX) - Abnormal; Notable for the following:    Bacteria, UA MANY (*)    Squamous Epithelial / LPF 6-30 (*)    All other components within normal limits   No results found.   1. Abdominal pain in pregnancy, second trimester   2. Supervision of high risk pregnancy due to social problems in second trimester   3. Morning sickness       MDM  Educated pt that at 18wks she will not feel movement every day. Also had discussion about consistantly taking nausea meds when nauseated. FHR st and reg with doppler. Will d/c home.

## 2017-10-03 NOTE — Discharge Instructions (Signed)

## 2017-10-13 ENCOUNTER — Ambulatory Visit (INDEPENDENT_AMBULATORY_CARE_PROVIDER_SITE_OTHER): Payer: Medicare Other | Admitting: Obstetrics and Gynecology

## 2017-10-13 ENCOUNTER — Encounter: Payer: Self-pay | Admitting: Obstetrics and Gynecology

## 2017-10-13 VITALS — BP 123/86 | HR 108 | Wt 191.0 lb

## 2017-10-13 DIAGNOSIS — O09299 Supervision of pregnancy with other poor reproductive or obstetric history, unspecified trimester: Secondary | ICD-10-CM

## 2017-10-13 DIAGNOSIS — O09219 Supervision of pregnancy with history of pre-term labor, unspecified trimester: Secondary | ICD-10-CM

## 2017-10-13 DIAGNOSIS — Z8632 Personal history of gestational diabetes: Secondary | ICD-10-CM

## 2017-10-13 DIAGNOSIS — O0972 Supervision of high risk pregnancy due to social problems, second trimester: Secondary | ICD-10-CM

## 2017-10-13 DIAGNOSIS — O09899 Supervision of other high risk pregnancies, unspecified trimester: Secondary | ICD-10-CM

## 2017-10-13 DIAGNOSIS — F3132 Bipolar disorder, current episode depressed, moderate: Secondary | ICD-10-CM

## 2017-10-13 HISTORY — DX: Supervision of pregnancy with other poor reproductive or obstetric history, unspecified trimester: O09.299

## 2017-10-13 HISTORY — DX: Supervision of other high risk pregnancies, unspecified trimester: O09.899

## 2017-10-13 HISTORY — DX: Personal history of gestational diabetes: Z86.32

## 2017-10-13 MED ORDER — ASPIRIN EC 81 MG PO TBEC: 81.0000 mg | DELAYED_RELEASE_TABLET | Freq: Every day | ORAL | 2 refills | Status: DC

## 2017-10-13 NOTE — Progress Notes (Signed)
Pt states she is having vaginal d/c. Pt defers exam today unless by female provider. Pt states she would like lab results that will give her gender.
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
## 2017-10-15 ENCOUNTER — Encounter (HOSPITAL_COMMUNITY): Payer: Self-pay

## 2017-10-15 ENCOUNTER — Inpatient Hospital Stay (HOSPITAL_COMMUNITY)
Admission: AD | Admit: 2017-10-15 | Discharge: 2017-10-15 | Disposition: A | Discharge status: Home / Self Care | Payer: Medicare Other | Source: Ambulatory Visit | Attending: Obstetrics and Gynecology | Admitting: Obstetrics and Gynecology

## 2017-10-15 ENCOUNTER — Inpatient Hospital Stay (HOSPITAL_COMMUNITY): Payer: Medicare Other

## 2017-10-15 DIAGNOSIS — O4692 Antepartum hemorrhage, unspecified, second trimester: Secondary | ICD-10-CM

## 2017-10-15 DIAGNOSIS — Z7982 Long term (current) use of aspirin: Secondary | ICD-10-CM | POA: Insufficient documentation

## 2017-10-15 DIAGNOSIS — N939 Abnormal uterine and vaginal bleeding, unspecified: Secondary | ICD-10-CM | POA: Diagnosis present

## 2017-10-15 DIAGNOSIS — Z888 Allergy status to other drugs, medicaments and biological substances status: Secondary | ICD-10-CM | POA: Diagnosis not present

## 2017-10-15 DIAGNOSIS — O209 Hemorrhage in early pregnancy, unspecified: Secondary | ICD-10-CM | POA: Insufficient documentation

## 2017-10-15 DIAGNOSIS — Z3A19 19 weeks gestation of pregnancy: Secondary | ICD-10-CM | POA: Diagnosis not present

## 2017-10-15 DIAGNOSIS — Z79899 Other long term (current) drug therapy: Secondary | ICD-10-CM | POA: Diagnosis not present

## 2017-10-15 DIAGNOSIS — Z8371 Family history of colonic polyps: Secondary | ICD-10-CM | POA: Insufficient documentation

## 2017-10-15 DIAGNOSIS — Z800 Family history of malignant neoplasm of digestive organs: Secondary | ICD-10-CM | POA: Insufficient documentation

## 2017-10-15 DIAGNOSIS — Z3492 Encounter for supervision of normal pregnancy, unspecified, second trimester: Secondary | ICD-10-CM

## 2017-10-15 DIAGNOSIS — Z818 Family history of other mental and behavioral disorders: Secondary | ICD-10-CM | POA: Insufficient documentation

## 2017-10-15 LAB — URINALYSIS, ROUTINE W REFLEX MICROSCOPIC
Bilirubin Urine: NEGATIVE
Glucose, UA: NEGATIVE mg/dL
Ketones, ur: NEGATIVE mg/dL
Nitrite: NEGATIVE
Protein, ur: 30 mg/dL — AB
Specific Gravity, Urine: 1.029 (ref 1.005–1.030)
pH: 5 (ref 5.0–8.0)

## 2017-10-15 LAB — WET PREP, GENITAL
Clue Cells Wet Prep HPF POC: NONE SEEN
Sperm: NONE SEEN
Trich, Wet Prep: NONE SEEN
Yeast Wet Prep HPF POC: NONE SEEN

## 2017-10-15 NOTE — MAU Note (Signed)
Pt presents to MAU with complaints of abdominal pain with vaginal bleeding when she wipes

## 2017-10-15 NOTE — Discharge Instructions (Signed)
Vaginal Bleeding During Pregnancy, Second Trimester °A small amount of bleeding (spotting) from the vagina is relatively common in pregnancy. It usually stops on its own. Various things can cause bleeding or spotting in pregnancy. Some bleeding may be related to the pregnancy, and some may not. Sometimes the bleeding is normal and is not a problem. However, bleeding can also be a sign of something serious. Be sure to tell your health care provider about any vaginal bleeding right away. °Some possible causes of vaginal bleeding during the second trimester include: °· Infection, inflammation, or growths on the cervix. °· The placenta may be partially or completely covering the opening of the cervix inside the uterus (placenta previa). °· The placenta may have separated from the uterus (abruption of the placenta). °· You may be having early (preterm) labor. °· The cervix may not be strong enough to keep a baby inside the uterus (cervical insufficiency). °· Tiny cysts may have developed in the uterus instead of pregnancy tissue (molar pregnancy). ° °Follow these instructions at home: °Watch your condition for any changes. The following actions may help to lessen any discomfort you are feeling: °· Follow your health care provider's instructions for limiting your activity. If your health care provider orders bed rest, you may need to stay in bed and only get up to use the bathroom. However, your health care provider may allow you to continue light activity. °· If needed, make plans for someone to help with your regular activities and responsibilities while you are on bed rest. °· Keep track of the number of pads you use each day, how often you change pads, and how soaked (saturated) they are. Write this down. °· Do not use tampons. Do not douche. °· Do not have sexual intercourse or orgasms until approved by your health care provider. °· If you pass any tissue from your vagina, save the tissue so you can show it to your  health care provider. °· Only take over-the-counter or prescription medicines as directed by your health care provider. °· Do not take aspirin because it can make you bleed. °· Do not exercise or perform any strenuous activities or heavy lifting without your health care provider's permission. °· Keep all follow-up appointments as directed by your health care provider. ° °Contact a health care provider if: °· You have any vaginal bleeding during any part of your pregnancy. °· You have cramps or labor pains. °· You have a fever, not controlled by medicine. °Get help right away if: °· You have severe cramps in your back or belly (abdomen). °· You have contractions. °· You have chills. °· You pass large clots or tissue from your vagina. °· Your bleeding increases. °· You feel light-headed or weak, or you have fainting episodes. °· You are leaking fluid or have a gush of fluid from your vagina. °This information is not intended to replace advice given to you by your health care provider. Make sure you discuss any questions you have with your health care provider. °Document Released: 09/10/2005 Document Revised: 05/08/2016 Document Reviewed: 08/08/2013 °Elsevier Interactive Patient Education © 2018 Elsevier Inc. ° °

## 2017-10-15 NOTE — MAU Provider Note (Signed)
History     CSN: [id number]  Arrival date and time: 10/15/17 1256   First Provider Initiated Contact with Patient 10/15/17 1348      Chief Complaint  Patient presents with  . Vaginal Bleeding  . Abdominal Pain   HPI  [name] is a 31 y.o. [id number] at [redacted]w[redacted]d who presents with abdominal cramping & vaginal spotting. Reports lower abdominal cramping yesterday morning that resolved without intervention; no pain today. Reports brown spotting on toilet paper this morning and afternoon. Had white watery discharge prior to spotting episode. Denies vaginal irritation, dysuria, or recent intercourse. Had initial ob earlier this week but denies pelvic exam with that visit.   OB History    Gravida Para Term Preterm AB Living   5 2 1 1 2 2    SAB TAB Ectopic Multiple Live Births         0 2      Past Medical History:  Diagnosis Date  . Anxiety   . Asthma    inhaler PRN-hasn't used inhaler in months  . Attention deficit hyperactivity disorder   . Bipolar disorder [facility])    hospitalized as a teen for suicidal ideations and cutting  . Depression   . Gastritis   . GERD (gastroesophageal reflux disease)   . HPV (human papilloma virus) anogenital infection   . Hx of varicella   . Mild preeclampsia 02/09/2016  . Vaginal Pap smear, abnormal     Past Surgical History:  Procedure Laterality Date  . EYE MUSCLE SURGERY Bilateral    07/29/12  . MOUTH SURGERY    . PLANTAR FASCIA SURGERY    . WISDOM TOOTH EXTRACTION      Family History  Problem Relation Age of Onset  . Depression Mother   . Colon cancer Mother   . Colon polyps Mother   . Depression Father   . Depression Brother   . Liver disease Unknown   . Kidney disease Unknown     Social History  Substance Use Topics  . Smoking status: Never Smoker  . Smokeless tobacco: Never Used  . Alcohol use No    Allergies:  Allergies  Allergen Reactions  . Depakote [Divalproex Sodium] Other (See Comments)    Pt states that  this medication makes her blood levels toxic.    [name] Methylphenidate Derivatives Other (See Comments)    Reaction:  Depression and anger   . Neurontin [Gabapentin] Other (See Comments)    Reaction:  Dizziness   . Prozac [Fluoxetine Hcl] Other (See Comments)    Reaction:  Anger     Prescriptions Prior to Admission  Medication Sig Dispense Refill Last Dose  . aspirin EC 81 MG tablet Take 1 tablet (81 mg total) by mouth daily. Take after 12 weeks for prevention of preeclampsia later in pregnancy 300 tablet 2   . chlorproMAZINE (THORAZINE) 25 MG tablet Take 25 mg by mouth daily.   10/02/2017 at Unknown time  . Prenatal Vit-Fe Fumarate-FA (PRENATAL MULTIVITAMIN) TABS tablet Take 1 tablet by mouth daily at 12 noon.   Taking  . promethazine (PHENERGAN) 25 MG tablet Take 25 mg by mouth every 6 (six) hours as needed for nausea or vomiting.   5 Past Week at Unknown time    Review of Systems  Constitutional: Negative.   Gastrointestinal: Negative.   Genitourinary: Positive for vaginal bleeding and vaginal discharge. Negative for dysuria.   Physical Exam   Blood pressure 115/77, pulse 93, temperature 99 F (37.2 C), temperature  source Oral, resp. rate 18, height 5\' 3"  (1.6 m), weight 195 lb (88.5 kg), SpO2 100 %, unknown if currently breastfeeding.  Physical Exam  Nursing note and vitals reviewed. Constitutional: She is oriented to person, place, and time. She appears well-developed and well-nourished. No distress.  HENT:  Head: Normocephalic and atraumatic.  Eyes: Conjunctivae are normal. Right eye exhibits no discharge. Left eye exhibits no discharge. No scleral icterus.  Neck: Normal range of motion.  Respiratory: Effort normal. No respiratory distress.  GI: Soft. There is no tenderness.  Genitourinary: No bleeding in the vagina. Vaginal discharge (small amount of tan discharge; no pooling of bleeding) found.  Genitourinary Comments: Cervix visually closed  Neurological: She is alert and  oriented to person, place, and time.  Skin: Skin is warm and dry. She is not diaphoretic.  Psychiatric: She has a normal mood and affect. Her behavior is normal. Judgment and thought content normal.    MAU Course  Procedures Results for orders placed or performed during the hospital encounter of 10/15/17 (from the past 24 hour(s))  Urinalysis, Routine w reflex microscopic     Status: Abnormal   Collection Time: 10/15/17 12:30 PM  Result Value Ref Range   Color, Urine YELLOW YELLOW   APPearance HAZY (A) CLEAR   Specific Gravity, Urine 1.029 1.005 - 1.030   pH 5.0 5.0 - 8.0   Glucose, UA NEGATIVE NEGATIVE mg/dL   Hgb urine dipstick MODERATE (A) NEGATIVE   Bilirubin Urine NEGATIVE NEGATIVE   Ketones, ur NEGATIVE NEGATIVE mg/dL   Protein, ur 30 (A) NEGATIVE mg/dL   Nitrite NEGATIVE NEGATIVE   Leukocytes, UA SMALL (A) NEGATIVE   RBC / HPF 0-5 0 - 5 RBC/hpf   WBC, UA 6-30 0 - 5 WBC/hpf   Bacteria, UA FEW (A) NONE SEEN   Squamous Epithelial / LPF 6-30 (A) NONE SEEN   Mucus PRESENT   Wet prep, genital     Status: Abnormal   Collection Time: 10/15/17  2:10 PM  Result Value Ref Range   Yeast Wet Prep HPF POC NONE SEEN NONE SEEN   Trich, Wet Prep NONE SEEN NONE SEEN   Clue Cells Wet Prep HPF POC NONE SEEN NONE SEEN   WBC, Wet Prep HPF POC MODERATE (A) NONE SEEN   Sperm NONE SEEN     MDM FHT 145 O positive No blood or pooling on SSE. Cervix visually closed. Patient w/hx of PTD at 35 wks & will follow CLs per Dr. Marjory Lies note. Will get u/s today to assess placenta & CL.  GC/CT & wet prep collected Ultrasound -- no placenta previa & normal Cervical length Assessment and Plan  A: 1. Vaginal bleeding in pregnancy, second trimester   2. [redacted] weeks gestation of pregnancy   3. Fetal heart tones present, second trimester    P: Discharge home Discussed reasons to return to MAU Keep f/u with OB GC/CT pending   Jorje Guild 10/15/2017, 1:49 PM

## 2017-10-16 LAB — GC/CHLAMYDIA PROBE AMP (~~LOC~~) NOT AT ARMC
Chlamydia: NEGATIVE
Neisseria Gonorrhea: NEGATIVE

## 2017-10-19 ENCOUNTER — Other Ambulatory Visit: Payer: Self-pay | Admitting: Obstetrics and Gynecology

## 2017-10-19 ENCOUNTER — Ambulatory Visit (HOSPITAL_COMMUNITY)
Admission: RE | Admit: 2017-10-19 | Discharge: 2017-10-19 | Disposition: A | Discharge status: Home / Self Care | Payer: Medicare Other | Source: Ambulatory Visit | Attending: Obstetrics and Gynecology | Admitting: Obstetrics and Gynecology

## 2017-10-19 DIAGNOSIS — O99512 Diseases of the respiratory system complicating pregnancy, second trimester: Secondary | ICD-10-CM | POA: Diagnosis not present

## 2017-10-19 DIAGNOSIS — Z3A19 19 weeks gestation of pregnancy: Secondary | ICD-10-CM | POA: Insufficient documentation

## 2017-10-19 DIAGNOSIS — O09892 Supervision of other high risk pregnancies, second trimester: Secondary | ICD-10-CM

## 2017-10-19 DIAGNOSIS — O0972 Supervision of high risk pregnancy due to social problems, second trimester: Secondary | ICD-10-CM

## 2017-10-19 DIAGNOSIS — O132 Gestational [pregnancy-induced] hypertension without significant proteinuria, second trimester: Secondary | ICD-10-CM | POA: Insufficient documentation

## 2017-10-19 DIAGNOSIS — J45909 Unspecified asthma, uncomplicated: Secondary | ICD-10-CM | POA: Diagnosis not present

## 2017-10-19 DIAGNOSIS — O09299 Supervision of pregnancy with other poor reproductive or obstetric history, unspecified trimester: Secondary | ICD-10-CM

## 2017-10-19 DIAGNOSIS — Z369 Encounter for antenatal screening, unspecified: Secondary | ICD-10-CM

## 2017-10-19 DIAGNOSIS — O09212 Supervision of pregnancy with history of pre-term labor, second trimester: Secondary | ICD-10-CM

## 2017-10-19 DIAGNOSIS — Z363 Encounter for antenatal screening for malformations: Secondary | ICD-10-CM | POA: Insufficient documentation

## 2017-10-19 DIAGNOSIS — O99342 Other mental disorders complicating pregnancy, second trimester: Secondary | ICD-10-CM | POA: Diagnosis not present

## 2017-10-19 DIAGNOSIS — O09292 Supervision of pregnancy with other poor reproductive or obstetric history, second trimester: Secondary | ICD-10-CM | POA: Insufficient documentation

## 2017-10-20 ENCOUNTER — Other Ambulatory Visit: Payer: Self-pay | Admitting: *Deleted

## 2017-10-20 DIAGNOSIS — Z3492 Encounter for supervision of normal pregnancy, unspecified, second trimester: Secondary | ICD-10-CM

## 2017-10-20 NOTE — Progress Notes (Signed)
U/s orders placed per Dr Rip Harbour request.

## 2017-10-22 ENCOUNTER — Other Ambulatory Visit (HOSPITAL_COMMUNITY): Payer: Medicare Other

## 2017-10-27 ENCOUNTER — Inpatient Hospital Stay (HOSPITAL_COMMUNITY)
Admission: AD | Admit: 2017-10-27 | Discharge: 2017-10-27 | Disposition: A | Discharge status: Home / Self Care | Payer: Medicare Other | Source: Ambulatory Visit | Attending: Family Medicine | Admitting: Family Medicine

## 2017-10-27 ENCOUNTER — Encounter (HOSPITAL_COMMUNITY): Payer: Self-pay | Admitting: *Deleted

## 2017-10-27 DIAGNOSIS — Z7982 Long term (current) use of aspirin: Secondary | ICD-10-CM | POA: Diagnosis not present

## 2017-10-27 DIAGNOSIS — Z3A20 20 weeks gestation of pregnancy: Secondary | ICD-10-CM | POA: Insufficient documentation

## 2017-10-27 DIAGNOSIS — N898 Other specified noninflammatory disorders of vagina: Secondary | ICD-10-CM

## 2017-10-27 DIAGNOSIS — R109 Unspecified abdominal pain: Secondary | ICD-10-CM | POA: Diagnosis not present

## 2017-10-27 DIAGNOSIS — R1030 Lower abdominal pain, unspecified: Secondary | ICD-10-CM | POA: Diagnosis not present

## 2017-10-27 DIAGNOSIS — O26892 Other specified pregnancy related conditions, second trimester: Secondary | ICD-10-CM | POA: Diagnosis not present

## 2017-10-27 DIAGNOSIS — O26899 Other specified pregnancy related conditions, unspecified trimester: Secondary | ICD-10-CM

## 2017-10-27 LAB — URINALYSIS, ROUTINE W REFLEX MICROSCOPIC
Bilirubin Urine: NEGATIVE
Glucose, UA: NEGATIVE mg/dL
Hgb urine dipstick: NEGATIVE
Ketones, ur: NEGATIVE mg/dL
Leukocytes, UA: NEGATIVE
Nitrite: NEGATIVE
Protein, ur: 30 mg/dL — AB
Specific Gravity, Urine: 1.033 — ABNORMAL HIGH (ref 1.005–1.030)
pH: 5 (ref 5.0–8.0)

## 2017-10-27 LAB — CBC
HCT: 38.9 % (ref 36.0–46.0)
Hemoglobin: 12.8 g/dL (ref 12.0–15.0)
MCH: 30.8 pg (ref 26.0–34.0)
MCHC: 32.9 g/dL (ref 30.0–36.0)
MCV: 93.7 fL (ref 78.0–100.0)
Platelets: 169 10*3/uL (ref 150–400)
RBC: 4.15 MIL/uL (ref 3.87–5.11)
RDW: 14.5 % (ref 11.5–15.5)
WBC: 8.1 10*3/uL (ref 4.0–10.5)

## 2017-10-27 LAB — WET PREP, GENITAL
Clue Cells Wet Prep HPF POC: NONE SEEN
Sperm: NONE SEEN
Trich, Wet Prep: NONE SEEN
Yeast Wet Prep HPF POC: NONE SEEN

## 2017-10-27 NOTE — MAU Provider Note (Signed)
History     CSN: [id number]  Arrival date and time: 10/27/17 1807   Chief Complaint  Patient presents with  . Abdominal Pain  . Pelvic Pain    HPI: [name] is a 31 y.o. [id number] with IUP at [redacted]w[redacted]d who presents to maternity admissions via EMS reporting lower abdominal pressure and stabbing pain for 3 days. She states that it started in the LLQ and now has progressed to all lower abdomen. She reports white discharge but no current bleeding, no itch or burning. No foul smell. She also reports that 2 weeks ago she had some vaginal bleeding. She is anxious about current pregnancy due to history of preterm pregnancies.    Also denies any abnormal vaginal discharge, fevers, chills, sweats, dysuria, nausea, vomiting, other GI or GU symptoms or other general symptoms.  She receives [facility] at [facility].   Past obstetric history:                 OB History  Gravida Para Term Preterm AB Living  5 2 1 1 2 2   SAB TAB Ectopic Multiple Live Births        0 2    # Outcome Date GA Lbr Len/2nd Weight Sex Delivery Anes PTL Lv  5 Current           4 Preterm 02/03/17 [redacted]w[redacted]d  7 lb 7.9 oz (3.4 kg) M Vag-Spont EPI  LIV  3 Term 02/11/16 [redacted]w[redacted]d / 01:14 8 lb 2.6 oz (3.702 kg) F Vag-Spont EPI  LIV  2 AB           1 AB               Past Medical History:  Diagnosis Date  . Anxiety   . Asthma    inhaler PRN-hasn't used inhaler in months  . Attention deficit hyperactivity disorder   . Bipolar disorder [facility])    hospitalized as a teen for suicidal ideations and cutting  . Depression   . Gastritis   . GERD (gastroesophageal reflux disease)   . HPV (human papilloma virus) anogenital infection   . Hx of varicella   . Mild preeclampsia 02/09/2016  . Vaginal Pap smear, abnormal     Past Surgical History:  Procedure Laterality Date  . EYE MUSCLE SURGERY Bilateral    07/29/12  . MOUTH SURGERY    . PLANTAR FASCIA SURGERY    . WISDOM TOOTH EXTRACTION       Family History  Problem Relation Age of Onset  . Depression Mother   . Colon cancer Mother   . Colon polyps Mother   . Depression Father   . Depression Brother   . Liver disease Unknown   . Kidney disease Unknown     Social History   Tobacco Use  . Smoking status: Never Smoker  . Smokeless tobacco: Never Used  Substance Use Topics  . Alcohol use: No    Alcohol/week: 0.0 oz  . Drug use: No    Allergies:  Allergies  Allergen Reactions  . Depakote [Divalproex Sodium] Other (See Comments)    Pt states that this medication makes her blood levels toxic.    [name] Methylphenidate Derivatives Other (See Comments)    Reaction:  Depression and anger   . Neurontin [Gabapentin] Other (See Comments)    Reaction:  Dizziness   . Prozac [Fluoxetine Hcl] Other (See Comments)    Reaction:  Anger     Medications Prior to Admission  Medication Sig Dispense  Refill Last Dose  . aspirin EC 81 MG tablet Take 1 tablet (81 mg total) by mouth daily. Take after 12 weeks for prevention of preeclampsia later in pregnancy 300 tablet 2   . chlorproMAZINE (THORAZINE) 25 MG tablet Take 25 mg by mouth daily.   10/02/2017 at Unknown time  . Prenatal Vit-Fe Fumarate-FA (PRENATAL MULTIVITAMIN) TABS tablet Take 1 tablet by mouth daily at 12 noon.   Taking  . promethazine (PHENERGAN) 25 MG tablet Take 25 mg by mouth every 6 (six) hours as needed for nausea or vomiting.   5 Past Week at Unknown time    Review of Systems - Negative except for what is mentioned in HPI.  Physical Exam   Blood pressure 96/77, pulse 88, temperature 98.4 F (36.9 C), temperature source Oral, resp. rate 19, unknown if currently breastfeeding.  Constitutional: Well-developed, well-nourished female in no acute distress.  HENT: [place]/AT, normal oropharynx mucosa. MMM Eyes: normal conjunctivae, no scleral icterus Cardiovascular: normal rate, regular rhythm Respiratory: normal effort, lungs CTAB.  GI: Abd soft, non-tender, gravid  appropriate for gestational age.   GU: Neg CVAT. Pelvic: NEFG, physiologic discharge, no blood, cervix clean. No CMT SVE: Cervix os closed  with white/light yellowish discharge from os. Light bleeding during culture. Bimanual exam tender with cervical and bilateral annexe manipulation. No masses appreciated    MSK: Extremities nontender, no edema, normal ROM Neurologic: Alert and oriented x 4. Psych: Patient is anxious, cooperative Skin: warm and dry Fetal heart tones: 146  MAU Course  Procedures  MDM  Lab Orders     Wet prep, genital     Urinalysis, Routine w reflex microscopic     CBC  Labs unremarkable GC/CT pending   Assessment and Plan  Assessment: 31 year old G5P1 at [redacted]w[redacted]d complaining of lower abdominal pain for 3 days.  White vaginal discharge, no itching or current vaginal bleeding. Labs unremarkable. Patient stable for discharge home.     Plan: --Tylenol for pain --Discharge home in stable condition.          Allergies as of 10/27/2017      Reactions   Depakote [divalproex Sodium] Other (See Comments)   Pt states that this medication makes her blood levels toxic.     Methylphenidate Derivatives Other (See Comments)   Reaction:  Depression and anger    Neurontin [gabapentin] Other (See Comments)   Reaction:  Dizziness    Prozac [fluoxetine Hcl] Other (See Comments)   Reaction:  Anger         Ebony Cargo Minott 10/27/2017, 7:36 PM   Midwife attestation:  I have seen and examined this patient; I agree with above documentation in the resident's note.   HPI: [name] is a 31 y.o. [id number] @20 .6 wks reporting abdominal pain and vaginal discharge. She reports +FM. Denies LOF, VB, contractions. Review of records reveals this pregnancy has been complicated by bipolar disorder, hx of pre-e, hx PTD, hx of GDM, and short pregnancy interval. See resident's note for ROS and PFSH  PE: Gen: calm comfortable, NAD Resp: normal effort, no  distress Abd: soft, non-tender, gravid FHT 146  Results for orders placed or performed during the hospital encounter of 10/27/17 (from the past 24 hour(s))  Wet prep, genital     Status: Abnormal   Collection Time: 10/27/17  6:55 PM  Result Value Ref Range   Yeast Wet Prep HPF POC NONE SEEN NONE SEEN   Trich, Wet Prep NONE SEEN NONE SEEN  Clue Cells Wet Prep HPF POC NONE SEEN NONE SEEN   WBC, Wet Prep HPF POC MODERATE (A) NONE SEEN   Sperm NONE SEEN   Urinalysis, Routine w reflex microscopic     Status: Abnormal   Collection Time: 10/27/17  7:00 PM  Result Value Ref Range   Color, Urine YELLOW YELLOW   APPearance HAZY (A) CLEAR   Specific Gravity, Urine 1.033 (H) 1.005 - 1.030   pH 5.0 5.0 - 8.0   Glucose, UA NEGATIVE NEGATIVE mg/dL   Hgb urine dipstick NEGATIVE NEGATIVE   Bilirubin Urine NEGATIVE NEGATIVE   Ketones, ur NEGATIVE NEGATIVE mg/dL   Protein, ur 30 (A) NEGATIVE mg/dL   Nitrite NEGATIVE NEGATIVE   Leukocytes, UA NEGATIVE NEGATIVE   RBC / HPF 0-5 0 - 5 RBC/hpf   WBC, UA 0-5 0 - 5 WBC/hpf   Bacteria, UA RARE (A) NONE SEEN   Squamous Epithelial / LPF 0-5 (A) NONE SEEN   Mucus PRESENT   CBC     Status: None   Collection Time: 10/27/17  7:14 PM  Result Value Ref Range   WBC 8.1 4.0 - 10.5 K/uL   RBC 4.15 3.87 - 5.11 MIL/uL   Hemoglobin 12.8 12.0 - 15.0 g/dL   HCT 38.9 36.0 - 46.0 %   MCV 93.7 78.0 - 100.0 fL   MCH 30.8 26.0 - 34.0 pg   MCHC 32.9 30.0 - 36.0 g/dL   RDW 14.5 11.5 - 15.5 %   Platelets 169 150 - 400 K/uL   MDM: ROS, labs, PMH reviewed No evidence of acute abdominal process, UTI or vaginal infection. Pain likely RL. Stable for discharge home.  A/P: 1. [redacted] weeks gestation of pregnancy   2. Abdominal pain affecting pregnancy   3. Vaginal discharge during pregnancy in second trimester    Discharge home Follow up in OB office as scheduled Return to MAU for OB emergencies  Allergies as of 10/27/2017      Reactions   Depakote [divalproex Sodium]  Other (See Comments)   Pt states that this medication makes her blood levels toxic.     Methylphenidate Derivatives Other (See Comments)   Reaction:  Depression and anger    Neurontin [gabapentin] Other (See Comments)   Reaction:  Dizziness    Prozac [fluoxetine Hcl] Other (See Comments)   Reaction:  Anger       Medication List    TAKE these medications   aspirin EC 81 MG tablet Take 1 tablet (81 mg total) by mouth daily. Take after 12 weeks for prevention of preeclampsia later in pregnancy   chlorproMAZINE 25 MG tablet Commonly known as:  THORAZINE Take 25 mg 3 (three) times daily by mouth.   prenatal multivitamin Tabs tablet Take 1 tablet at bedtime by mouth.   promethazine 25 MG tablet Commonly known as:  PHENERGAN Take 25 mg by mouth every 6 (six) hours as needed for nausea or vomiting.      Julianne Handler, CNM  8:19 PM

## 2017-10-27 NOTE — MAU Note (Signed)
Pt came in by EMS, C/O lower abd pain & pressure for the last 4 days, denies bleeding or LOF today.

## 2017-10-27 NOTE — Discharge Instructions (Signed)

## 2017-10-28 LAB — GC/CHLAMYDIA PROBE AMP (~~LOC~~) NOT AT ARMC
Chlamydia: NEGATIVE
Neisseria Gonorrhea: NEGATIVE

## 2017-10-30 ENCOUNTER — Encounter: Payer: Self-pay | Admitting: Obstetrics and Gynecology

## 2017-11-05 ENCOUNTER — Inpatient Hospital Stay (HOSPITAL_COMMUNITY)
Admission: AD | Admit: 2017-11-05 | Discharge: 2017-11-05 | Disposition: A | Discharge status: Home / Self Care | Payer: Medicare Other | Source: Ambulatory Visit | Attending: Obstetrics and Gynecology | Admitting: Obstetrics and Gynecology

## 2017-11-05 DIAGNOSIS — O26892 Other specified pregnancy related conditions, second trimester: Secondary | ICD-10-CM | POA: Diagnosis not present

## 2017-11-05 DIAGNOSIS — Z818 Family history of other mental and behavioral disorders: Secondary | ICD-10-CM | POA: Diagnosis not present

## 2017-11-05 DIAGNOSIS — R109 Unspecified abdominal pain: Secondary | ICD-10-CM | POA: Diagnosis not present

## 2017-11-05 DIAGNOSIS — N898 Other specified noninflammatory disorders of vagina: Secondary | ICD-10-CM | POA: Diagnosis not present

## 2017-11-05 DIAGNOSIS — Z888 Allergy status to other drugs, medicaments and biological substances status: Secondary | ICD-10-CM | POA: Insufficient documentation

## 2017-11-05 DIAGNOSIS — R102 Pelvic and perineal pain: Secondary | ICD-10-CM | POA: Insufficient documentation

## 2017-11-05 DIAGNOSIS — Z3A22 22 weeks gestation of pregnancy: Secondary | ICD-10-CM | POA: Diagnosis not present

## 2017-11-05 DIAGNOSIS — Z800 Family history of malignant neoplasm of digestive organs: Secondary | ICD-10-CM | POA: Diagnosis not present

## 2017-11-05 DIAGNOSIS — N949 Unspecified condition associated with female genital organs and menstrual cycle: Secondary | ICD-10-CM

## 2017-11-05 DIAGNOSIS — Z8371 Family history of colonic polyps: Secondary | ICD-10-CM | POA: Diagnosis not present

## 2017-11-05 DIAGNOSIS — O26899 Other specified pregnancy related conditions, unspecified trimester: Secondary | ICD-10-CM

## 2017-11-05 LAB — URINALYSIS, ROUTINE W REFLEX MICROSCOPIC
Bilirubin Urine: NEGATIVE
Glucose, UA: NEGATIVE mg/dL
Hgb urine dipstick: NEGATIVE
Ketones, ur: NEGATIVE mg/dL
Nitrite: NEGATIVE
Protein, ur: 30 mg/dL — AB
Specific Gravity, Urine: 1.031 — ABNORMAL HIGH (ref 1.005–1.030)
pH: 6 (ref 5.0–8.0)

## 2017-11-05 LAB — WET PREP, GENITAL
Clue Cells Wet Prep HPF POC: NONE SEEN
Sperm: NONE SEEN
Trich, Wet Prep: NONE SEEN
Yeast Wet Prep HPF POC: NONE SEEN

## 2017-11-05 LAB — OB RESULTS CONSOLE GC/CHLAMYDIA: Gonorrhea: NEGATIVE

## 2017-11-05 NOTE — MAU Note (Signed)
Pt reports via EMS with c/o contractions every 10-20 mins. Pt denies vaginal bleeding or LOF. Reports some discharge. Pt denies urinary s/s.

## 2017-11-05 NOTE — Discharge Instructions (Signed)

## 2017-11-05 NOTE — MAU Provider Note (Signed)
History     CSN: [id number]  Arrival date and time: 11/05/17 2125   First Provider Initiated Contact with Patient 11/05/17 2203     Chief Complaint  Patient presents with  . Contractions   HPI [name] is a 31 y.o. [id number] at [redacted]w[redacted]d who presents via EMS for contractions. She states they were every 10-15 minutes at home but have since stopped. She also reports a clear discharge with an odor for the last week. She denies any vaginal bleeding. Reports good fetal movement. States her cervix was 1cm last time it was checked.  OB History    Gravida Para Term Preterm AB Living   5 2 1 1 2 2    SAB TAB Ectopic Multiple Live Births         0 2      Past Medical History:  Diagnosis Date  . Anxiety   . Asthma    inhaler PRN-hasn't used inhaler in months  . Attention deficit hyperactivity disorder   . Bipolar disorder [facility])    hospitalized as a teen for suicidal ideations and cutting  . Depression   . Gastritis   . GERD (gastroesophageal reflux disease)   . HPV (human papilloma virus) anogenital infection   . Hx of varicella   . Mild preeclampsia 02/09/2016  . Vaginal Pap smear, abnormal     Past Surgical History:  Procedure Laterality Date  . EYE MUSCLE SURGERY Bilateral    07/29/12  . MOUTH SURGERY    . PLANTAR FASCIA SURGERY    . WISDOM TOOTH EXTRACTION      Family History  Problem Relation Age of Onset  . Depression Mother   . Colon cancer Mother   . Colon polyps Mother   . Depression Father   . Depression Brother   . Liver disease Unknown   . Kidney disease Unknown     Social History   Tobacco Use  . Smoking status: Never Smoker  . Smokeless tobacco: Never Used  Substance Use Topics  . Alcohol use: No    Alcohol/week: 0.0 oz  . Drug use: No    Allergies:  Allergies  Allergen Reactions  . Depakote [Divalproex Sodium] Other (See Comments)    Pt states that this medication makes her blood levels toxic.    [name] Methylphenidate Derivatives Other  (See Comments)    Reaction:  Depression and anger   . Neurontin [Gabapentin] Other (See Comments)    Reaction:  Dizziness   . Prozac [Fluoxetine Hcl] Other (See Comments)    Reaction:  Anger     Medications Prior to Admission  Medication Sig Dispense Refill Last Dose  . aspirin EC 81 MG tablet Take 1 tablet (81 mg total) by mouth daily. Take after 12 weeks for prevention of preeclampsia later in pregnancy 300 tablet 2 10/27/2017 at Unknown time  . chlorproMAZINE (THORAZINE) 25 MG tablet Take 25 mg 3 (three) times daily by mouth.    10/27/2017 at Unknown time  . Prenatal Vit-Fe Fumarate-FA (PRENATAL MULTIVITAMIN) TABS tablet Take 1 tablet at bedtime by mouth.    10/26/2017 at Unknown time  . promethazine (PHENERGAN) 25 MG tablet Take 25 mg by mouth every 6 (six) hours as needed for nausea or vomiting.   5 Past Month at Unknown time    Review of Systems  Constitutional: Negative.  Negative for fatigue and fever.  HENT: Negative.   Respiratory: Negative.  Negative for shortness of breath.   Cardiovascular: Negative.  Negative for  chest pain.  Gastrointestinal: Positive for abdominal pain. Negative for constipation, diarrhea, nausea and vomiting.  Genitourinary: Positive for vaginal discharge. Negative for dysuria and vaginal bleeding.  Neurological: Negative.  Negative for dizziness and headaches.   Physical Exam   Blood pressure 130/71, pulse 80, temperature 98.5 F (36.9 C), temperature source Oral, resp. rate 18, SpO2 100 %, unknown if currently breastfeeding.  Physical Exam  Nursing note and vitals reviewed. Constitutional: She is oriented to person, place, and time. She appears well-developed and well-nourished. No distress.  HENT:  Head: Normocephalic.  Eyes: Pupils are equal, round, and reactive to light.  Cardiovascular: Normal rate, regular rhythm and normal heart sounds.  Respiratory: Effort normal and breath sounds normal. No respiratory distress.  GI: Soft. Bowel sounds  are normal. She exhibits no distension. There is no tenderness.  Neurological: She is alert and oriented to person, place, and time.  Skin: Skin is warm and dry.  Psychiatric: She has a normal mood and affect. Her behavior is normal. Judgment and thought content normal.   Dilation: 1 Effacement (%): Thick Cervical Position: Posterior Station: Ballotable Exam by:: Haynes Bast CNM  FHT: 147 bpm  MAU Course  Procedures Results for orders placed or performed during the hospital encounter of 11/05/17 (from the past 24 hour(s))  Urinalysis, Routine w reflex microscopic     Status: Abnormal   Collection Time: 11/05/17  9:30 PM  Result Value Ref Range   Color, Urine YELLOW YELLOW   APPearance CLOUDY (A) CLEAR   Specific Gravity, Urine 1.031 (H) 1.005 - 1.030   pH 6.0 5.0 - 8.0   Glucose, UA NEGATIVE NEGATIVE mg/dL   Hgb urine dipstick NEGATIVE NEGATIVE   Bilirubin Urine NEGATIVE NEGATIVE   Ketones, ur NEGATIVE NEGATIVE mg/dL   Protein, ur 30 (A) NEGATIVE mg/dL   Nitrite NEGATIVE NEGATIVE   Leukocytes, UA SMALL (A) NEGATIVE   RBC / HPF 0-5 0 - 5 RBC/hpf   WBC, UA 0-5 0 - 5 WBC/hpf   Bacteria, UA FEW (A) NONE SEEN   Squamous Epithelial / LPF 6-30 (A) NONE SEEN   Mucus PRESENT    Amorphous Crystal PRESENT   Wet prep, genital     Status: Abnormal   Collection Time: 11/05/17 10:05 PM  Result Value Ref Range   Yeast Wet Prep HPF POC NONE SEEN NONE SEEN   Trich, Wet Prep NONE SEEN NONE SEEN   Clue Cells Wet Prep HPF POC NONE SEEN NONE SEEN   WBC, Wet Prep HPF POC MODERATE (A) NONE SEEN   Sperm NONE SEEN    MDM UA Wet prep and gc/chlamydia Patient refused pelvic exam- requested swabs only Assessment and Plan   1. Abdominal pain affecting pregnancy   2. [redacted] weeks gestation of pregnancy   3. Vaginal discharge   4. Round ligament pain    -Discharge home in stable condition -Encouraged patient to wear maternity support belt and increase water intake -Preterm labor precautions  discussed -Patient advised to follow-up with [facility] as scheduled for prenatal care -Patient may return to MAU as needed or if her condition were to change or worsen  Wende Mott CNM 11/05/2017, 10:33 PM

## 2017-11-06 LAB — GC/CHLAMYDIA PROBE AMP (~~LOC~~) NOT AT ARMC
Chlamydia: NEGATIVE
Neisseria Gonorrhea: NEGATIVE

## 2017-11-11 ENCOUNTER — Ambulatory Visit (INDEPENDENT_AMBULATORY_CARE_PROVIDER_SITE_OTHER): Payer: Medicare Other | Admitting: Obstetrics and Gynecology

## 2017-11-11 VITALS — BP 113/76 | HR 80 | Wt 193.0 lb

## 2017-11-11 DIAGNOSIS — O09899 Supervision of other high risk pregnancies, unspecified trimester: Secondary | ICD-10-CM

## 2017-11-11 DIAGNOSIS — Z8632 Personal history of gestational diabetes: Secondary | ICD-10-CM

## 2017-11-11 DIAGNOSIS — O09892 Supervision of other high risk pregnancies, second trimester: Secondary | ICD-10-CM

## 2017-11-11 DIAGNOSIS — O09219 Supervision of pregnancy with history of pre-term labor, unspecified trimester: Secondary | ICD-10-CM

## 2017-11-11 DIAGNOSIS — F79 Unspecified intellectual disabilities: Secondary | ICD-10-CM

## 2017-11-11 DIAGNOSIS — O09212 Supervision of pregnancy with history of pre-term labor, second trimester: Secondary | ICD-10-CM

## 2017-11-11 DIAGNOSIS — O0972 Supervision of high risk pregnancy due to social problems, second trimester: Secondary | ICD-10-CM

## 2017-11-11 DIAGNOSIS — O09292 Supervision of pregnancy with other poor reproductive or obstetric history, second trimester: Secondary | ICD-10-CM

## 2017-11-11 DIAGNOSIS — O09299 Supervision of pregnancy with other poor reproductive or obstetric history, unspecified trimester: Secondary | ICD-10-CM

## 2017-11-11 NOTE — Progress Notes (Signed)
Pt requests cx check today.
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
## 2017-11-16 ENCOUNTER — Ambulatory Visit (HOSPITAL_COMMUNITY): Admission: RE | Admit: 2017-11-16 | Payer: Medicare Other | Source: Ambulatory Visit

## 2017-11-21 ENCOUNTER — Inpatient Hospital Stay (HOSPITAL_COMMUNITY)
Admission: AD | Admit: 2017-11-21 | Discharge: 2017-11-21 | Disposition: A | Discharge status: Home / Self Care | Payer: Medicare Other | Source: Ambulatory Visit | Attending: Obstetrics & Gynecology | Admitting: Obstetrics & Gynecology

## 2017-11-21 ENCOUNTER — Other Ambulatory Visit: Payer: Self-pay

## 2017-11-21 ENCOUNTER — Encounter (HOSPITAL_COMMUNITY): Payer: Self-pay

## 2017-11-21 DIAGNOSIS — O26892 Other specified pregnancy related conditions, second trimester: Secondary | ICD-10-CM

## 2017-11-21 DIAGNOSIS — Z7982 Long term (current) use of aspirin: Secondary | ICD-10-CM | POA: Insufficient documentation

## 2017-11-21 DIAGNOSIS — O0972 Supervision of high risk pregnancy due to social problems, second trimester: Secondary | ICD-10-CM

## 2017-11-21 DIAGNOSIS — N898 Other specified noninflammatory disorders of vagina: Secondary | ICD-10-CM | POA: Insufficient documentation

## 2017-11-21 DIAGNOSIS — Z3A24 24 weeks gestation of pregnancy: Secondary | ICD-10-CM | POA: Insufficient documentation

## 2017-11-21 LAB — POCT FERN TEST: POCT Fern Test: NEGATIVE

## 2017-11-21 NOTE — MAU Note (Signed)
Urine sent to lab 

## 2017-11-21 NOTE — Discharge Instructions (Signed)

## 2017-11-21 NOTE — MAU Provider Note (Signed)
History     CSN: [id number]  Arrival date and time: 11/21/17 0207   First Provider Initiated Contact with Patient 11/21/17 [phone]      Chief Complaint  Patient presents with  . Rupture of Membranes   HPI [name] is a 31 y.o. [id number] at [redacted]w[redacted]d who presents to MAU today with complaint of LOF at 0200. She denies vaginal bleeding, contractions today. She reports some increased pelvic pressure. She reports normal fetal movement. She states last intercourse was yesterday.   OB History    Gravida Para Term Preterm AB Living   5 2 1 1 2 2    SAB TAB Ectopic Multiple Live Births         0 2      Past Medical History:  Diagnosis Date  . Anxiety   . Asthma    inhaler PRN-hasn't used inhaler in months  . Attention deficit hyperactivity disorder   . Bipolar disorder [facility])    hospitalized as a teen for suicidal ideations and cutting  . Depression   . Gastritis   . GERD (gastroesophageal reflux disease)   . HPV (human papilloma virus) anogenital infection   . Hx of varicella   . Mild preeclampsia 02/09/2016  . Vaginal Pap smear, abnormal     Past Surgical History:  Procedure Laterality Date  . EYE MUSCLE SURGERY Bilateral    07/29/12  . MOUTH SURGERY    . PLANTAR FASCIA SURGERY    . WISDOM TOOTH EXTRACTION      Family History  Problem Relation Age of Onset  . Depression Mother   . Colon cancer Mother   . Colon polyps Mother   . Depression Father   . Depression Brother   . Liver disease Unknown   . Kidney disease Unknown     Social History   Tobacco Use  . Smoking status: Never Smoker  . Smokeless tobacco: Never Used  Substance Use Topics  . Alcohol use: No    Alcohol/week: 0.0 oz  . Drug use: No    Allergies:  Allergies  Allergen Reactions  . Depakote [Divalproex Sodium] Other (See Comments)    Pt states that this medication makes her blood levels toxic.    [name] Methylphenidate Derivatives Other (See Comments)    Reaction:  Depression and anger    . Neurontin [Gabapentin] Other (See Comments)    Reaction:  Dizziness   . Prozac [Fluoxetine Hcl] Other (See Comments)    Reaction:  Anger     Medications Prior to Admission  Medication Sig Dispense Refill Last Dose  . aspirin EC 81 MG tablet Take 1 tablet (81 mg total) by mouth daily. Take after 12 weeks for prevention of preeclampsia later in pregnancy 300 tablet 2 11/20/2017 at Unknown time  . chlorproMAZINE (THORAZINE) 25 MG tablet Take 25 mg 3 (three) times daily by mouth.    11/20/2017 at Unknown time  . Prenatal Vit-Fe Fumarate-FA (PRENATAL MULTIVITAMIN) TABS tablet Take 1 tablet at bedtime by mouth.    11/20/2017 at Unknown time  . promethazine (PHENERGAN) 25 MG tablet Take 25 mg by mouth every 6 (six) hours as needed for nausea or vomiting.   5 Past Month at Unknown time    Review of Systems  Constitutional: Negative for fever.  Gastrointestinal: Negative for abdominal pain.  Genitourinary: Positive for vaginal discharge. Negative for vaginal bleeding.   Physical Exam   Blood pressure 117/72, pulse 90, temperature 98.6 F (37 C), temperature source Oral, resp.  rate 16, SpO2 98 %, unknown if currently breastfeeding.  Physical Exam  Nursing note and vitals reviewed. Constitutional: She is oriented to person, place, and time. She appears well-developed and well-nourished. No distress.  HENT:  Head: Normocephalic and atraumatic.  Cardiovascular: Normal rate.  Respiratory: Effort normal.  GI: Soft. She exhibits no distension.  Genitourinary: Uterus is enlarged. Uterus is not tender. Cervix exhibits no motion tenderness, no discharge and no friability. No bleeding in the vagina. Vaginal discharge (scant mucous) found.  Genitourinary Comments: Cervix: visually closed, thick  Neurological: She is alert and oriented to person, place, and time.  Skin: Skin is warm and dry. No erythema.  Psychiatric: She has a normal mood and affect.    Fetal Monitoring: Baseline: 130  bpm Variability: moderate Accelerations: 10 x 10 Decelerations: none Contractions: none  MAU Course  Procedures None  MDM Fern - negative, sperm noted on slide  Assessment and Plan  A: SIUP at [redacted]w[redacted]d Vaginal discharge in pregnancy, second trimester   P:  Discharge home Preterm labor precautions discussed Patient advised to follow-up with [facility] as scheduled or sooner PRN Patient may return to MAU as needed or if her condition were to change or worsen   Kerry Hough, PA-C 11/21/2017, 3:24 AM

## 2017-11-21 NOTE — MAU Note (Addendum)
Think my water is broke at 0200, a bunch of fluid with no color and no odor.  (Came in by EMS. ) No bleeding. Feeling pressure in vagina and low abd right after leaking. Baby moving well. [facility] [employer].

## 2017-11-22 ENCOUNTER — Encounter: Payer: Self-pay | Admitting: Obstetrics and Gynecology

## 2017-11-25 ENCOUNTER — Encounter: Payer: Medicare Other | Admitting: Obstetrics and Gynecology

## 2017-11-30 ENCOUNTER — Encounter: Payer: Self-pay | Admitting: Obstetrics and Gynecology

## 2017-11-30 ENCOUNTER — Ambulatory Visit (HOSPITAL_COMMUNITY)
Admission: RE | Admit: 2017-11-30 | Discharge: 2017-11-30 | Disposition: A | Discharge status: Home / Self Care | Payer: Medicare Other | Source: Ambulatory Visit | Attending: Obstetrics and Gynecology | Admitting: Obstetrics and Gynecology

## 2017-11-30 ENCOUNTER — Other Ambulatory Visit: Payer: Self-pay | Admitting: Obstetrics and Gynecology

## 2017-11-30 DIAGNOSIS — O99212 Obesity complicating pregnancy, second trimester: Secondary | ICD-10-CM

## 2017-11-30 DIAGNOSIS — Z3A25 25 weeks gestation of pregnancy: Secondary | ICD-10-CM

## 2017-11-30 DIAGNOSIS — O09892 Supervision of other high risk pregnancies, second trimester: Secondary | ICD-10-CM

## 2017-11-30 DIAGNOSIS — O99512 Diseases of the respiratory system complicating pregnancy, second trimester: Secondary | ICD-10-CM | POA: Insufficient documentation

## 2017-11-30 DIAGNOSIS — O09299 Supervision of pregnancy with other poor reproductive or obstetric history, unspecified trimester: Secondary | ICD-10-CM

## 2017-11-30 DIAGNOSIS — J45909 Unspecified asthma, uncomplicated: Secondary | ICD-10-CM | POA: Insufficient documentation

## 2017-11-30 DIAGNOSIS — O99342 Other mental disorders complicating pregnancy, second trimester: Secondary | ICD-10-CM | POA: Insufficient documentation

## 2017-11-30 DIAGNOSIS — O99340 Other mental disorders complicating pregnancy, unspecified trimester: Secondary | ICD-10-CM

## 2017-11-30 DIAGNOSIS — O09899 Supervision of other high risk pregnancies, unspecified trimester: Secondary | ICD-10-CM

## 2017-11-30 DIAGNOSIS — Z362 Encounter for other antenatal screening follow-up: Secondary | ICD-10-CM

## 2017-11-30 DIAGNOSIS — O09292 Supervision of pregnancy with other poor reproductive or obstetric history, second trimester: Secondary | ICD-10-CM | POA: Diagnosis not present

## 2017-11-30 DIAGNOSIS — O09219 Supervision of pregnancy with history of pre-term labor, unspecified trimester: Secondary | ICD-10-CM

## 2017-11-30 DIAGNOSIS — Z3492 Encounter for supervision of normal pregnancy, unspecified, second trimester: Secondary | ICD-10-CM

## 2017-11-30 DIAGNOSIS — E669 Obesity, unspecified: Secondary | ICD-10-CM | POA: Diagnosis not present

## 2017-12-03 ENCOUNTER — Other Ambulatory Visit: Payer: Self-pay | Admitting: Obstetrics and Gynecology

## 2017-12-03 ENCOUNTER — Encounter: Payer: Self-pay | Admitting: Obstetrics & Gynecology

## 2017-12-03 DIAGNOSIS — O0972 Supervision of high risk pregnancy due to social problems, second trimester: Secondary | ICD-10-CM

## 2017-12-03 DIAGNOSIS — O09219 Supervision of pregnancy with history of pre-term labor, unspecified trimester: Secondary | ICD-10-CM

## 2017-12-03 DIAGNOSIS — Z8632 Personal history of gestational diabetes: Secondary | ICD-10-CM

## 2017-12-03 DIAGNOSIS — O09299 Supervision of pregnancy with other poor reproductive or obstetric history, unspecified trimester: Secondary | ICD-10-CM

## 2017-12-03 DIAGNOSIS — Z3A31 31 weeks gestation of pregnancy: Secondary | ICD-10-CM

## 2017-12-03 DIAGNOSIS — O09899 Supervision of other high risk pregnancies, unspecified trimester: Secondary | ICD-10-CM

## 2017-12-04 ENCOUNTER — Other Ambulatory Visit: Payer: Self-pay | Admitting: Pediatrics

## 2017-12-04 MED ORDER — TERCONAZOLE 0.4 % VA CREA: 1.0000 | TOPICAL_CREAM | Freq: Every day | VAGINAL | 0 refills | Status: DC

## 2017-12-04 NOTE — Progress Notes (Signed)
Per standing order

## 2017-12-09 ENCOUNTER — Encounter (HOSPITAL_COMMUNITY): Payer: Self-pay

## 2017-12-09 ENCOUNTER — Telehealth: Payer: Self-pay | Admitting: Pediatrics

## 2017-12-09 ENCOUNTER — Inpatient Hospital Stay (HOSPITAL_COMMUNITY)
Admission: AD | Admit: 2017-12-09 | Discharge: 2017-12-09 | Disposition: A | Discharge status: Home / Self Care | Payer: Medicare Other | Source: Ambulatory Visit | Attending: Obstetrics & Gynecology | Admitting: Obstetrics & Gynecology

## 2017-12-09 DIAGNOSIS — O4703 False labor before 37 completed weeks of gestation, third trimester: Secondary | ICD-10-CM

## 2017-12-09 DIAGNOSIS — O99611 Diseases of the digestive system complicating pregnancy, first trimester: Secondary | ICD-10-CM | POA: Diagnosis not present

## 2017-12-09 DIAGNOSIS — E860 Dehydration: Secondary | ICD-10-CM

## 2017-12-09 DIAGNOSIS — F319 Bipolar disorder, unspecified: Secondary | ICD-10-CM | POA: Insufficient documentation

## 2017-12-09 DIAGNOSIS — O479 False labor, unspecified: Secondary | ICD-10-CM | POA: Diagnosis not present

## 2017-12-09 DIAGNOSIS — J45909 Unspecified asthma, uncomplicated: Secondary | ICD-10-CM | POA: Insufficient documentation

## 2017-12-09 DIAGNOSIS — O99342 Other mental disorders complicating pregnancy, second trimester: Secondary | ICD-10-CM | POA: Insufficient documentation

## 2017-12-09 DIAGNOSIS — O99511 Diseases of the respiratory system complicating pregnancy, first trimester: Secondary | ICD-10-CM | POA: Insufficient documentation

## 2017-12-09 DIAGNOSIS — K219 Gastro-esophageal reflux disease without esophagitis: Secondary | ICD-10-CM | POA: Diagnosis not present

## 2017-12-09 DIAGNOSIS — F419 Anxiety disorder, unspecified: Secondary | ICD-10-CM | POA: Insufficient documentation

## 2017-12-09 DIAGNOSIS — R52 Pain, unspecified: Secondary | ICD-10-CM | POA: Diagnosis not present

## 2017-12-09 DIAGNOSIS — O368130 Decreased fetal movements, third trimester, not applicable or unspecified: Secondary | ICD-10-CM | POA: Diagnosis not present

## 2017-12-09 DIAGNOSIS — O368120 Decreased fetal movements, second trimester, not applicable or unspecified: Secondary | ICD-10-CM | POA: Insufficient documentation

## 2017-12-09 DIAGNOSIS — Z3A27 27 weeks gestation of pregnancy: Secondary | ICD-10-CM | POA: Diagnosis not present

## 2017-12-09 DIAGNOSIS — M5489 Other dorsalgia: Secondary | ICD-10-CM | POA: Diagnosis not present

## 2017-12-09 LAB — WET PREP, GENITAL
Clue Cells Wet Prep HPF POC: NONE SEEN
Sperm: NONE SEEN
Trich, Wet Prep: NONE SEEN
Yeast Wet Prep HPF POC: NONE SEEN

## 2017-12-09 LAB — URINALYSIS, ROUTINE W REFLEX MICROSCOPIC
Bilirubin Urine: NEGATIVE
Glucose, UA: NEGATIVE mg/dL
Hgb urine dipstick: NEGATIVE
Ketones, ur: 80 mg/dL — AB
Nitrite: NEGATIVE
Protein, ur: 100 mg/dL — AB
Specific Gravity, Urine: 1.023 (ref 1.005–1.030)
pH: 5 (ref 5.0–8.0)

## 2017-12-09 MED ORDER — NIFEDIPINE 10 MG PO CAPS
20.0000 mg | ORAL_CAPSULE | Freq: Once | ORAL | Status: AC
  Administered 2017-12-09: 20 mg via ORAL
  Filled 2017-12-09: qty 2

## 2017-12-09 NOTE — Telephone Encounter (Signed)
Pt called office and left message on nurse line.  She c/o low back pain, vaginal pain and pressure.   I called patient back.  She c/o dull low back pain that does not stop, severe vaginal pressure-like baby is pushing down, and severe vaginal pain. She also c/o hip/thigh pain. She reports irregular contractions since yesterday.  I advised her since she is having severe pain and pressure w/ irregular contractions she should go to [facility] for eval.  I advised sometimes these symptoms can be preterm labor, but will not know until checked at [facility]. I advised to be sure to keep all f/u appts at office as well.  Pt voiced understanding and agreed with plan.

## 2017-12-09 NOTE — MAU Note (Signed)
Urine in lab, not enough for culture collect

## 2017-12-09 NOTE — MAU Note (Signed)
Arrived on EMS and states she called her doctor and they advised her to come here. Dull back pain and pelvic pain since yesterday. No vaginal bleeding or discharge. No fetal movement felt today.
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
## 2017-12-11 ENCOUNTER — Other Ambulatory Visit: Payer: Self-pay

## 2017-12-11 ENCOUNTER — Ambulatory Visit (INDEPENDENT_AMBULATORY_CARE_PROVIDER_SITE_OTHER): Payer: Medicare Other | Admitting: Obstetrics & Gynecology

## 2017-12-11 DIAGNOSIS — O0970 Supervision of high risk pregnancy due to social problems, unspecified trimester: Secondary | ICD-10-CM

## 2017-12-11 NOTE — Progress Notes (Signed)
Last PAP 2-3 yrs ago

## 2017-12-11 NOTE — Patient Instructions (Signed)
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
## 2017-12-12 ENCOUNTER — Inpatient Hospital Stay (HOSPITAL_COMMUNITY)
Admission: AD | Admit: 2017-12-12 | Discharge: 2017-12-12 | Disposition: A | Discharge status: Home / Self Care | Payer: Medicare Other | Source: Ambulatory Visit | Attending: Obstetrics and Gynecology | Admitting: Obstetrics and Gynecology

## 2017-12-12 ENCOUNTER — Encounter (HOSPITAL_COMMUNITY): Payer: Self-pay

## 2017-12-12 DIAGNOSIS — M545 Low back pain: Secondary | ICD-10-CM | POA: Diagnosis not present

## 2017-12-12 DIAGNOSIS — O26892 Other specified pregnancy related conditions, second trimester: Secondary | ICD-10-CM

## 2017-12-12 DIAGNOSIS — N949 Unspecified condition associated with female genital organs and menstrual cycle: Secondary | ICD-10-CM

## 2017-12-12 DIAGNOSIS — Z818 Family history of other mental and behavioral disorders: Secondary | ICD-10-CM | POA: Insufficient documentation

## 2017-12-12 DIAGNOSIS — O0970 Supervision of high risk pregnancy due to social problems, unspecified trimester: Secondary | ICD-10-CM

## 2017-12-12 DIAGNOSIS — R109 Unspecified abdominal pain: Secondary | ICD-10-CM | POA: Diagnosis present

## 2017-12-12 DIAGNOSIS — R102 Pelvic and perineal pain: Secondary | ICD-10-CM | POA: Diagnosis not present

## 2017-12-12 DIAGNOSIS — M549 Dorsalgia, unspecified: Secondary | ICD-10-CM | POA: Diagnosis not present

## 2017-12-12 DIAGNOSIS — Z3A27 27 weeks gestation of pregnancy: Secondary | ICD-10-CM | POA: Insufficient documentation

## 2017-12-12 DIAGNOSIS — Z8371 Family history of colonic polyps: Secondary | ICD-10-CM | POA: Insufficient documentation

## 2017-12-12 DIAGNOSIS — O9989 Other specified diseases and conditions complicating pregnancy, childbirth and the puerperium: Secondary | ICD-10-CM | POA: Diagnosis not present

## 2017-12-12 DIAGNOSIS — Z888 Allergy status to other drugs, medicaments and biological substances status: Secondary | ICD-10-CM | POA: Insufficient documentation

## 2017-12-12 DIAGNOSIS — O99891 Other specified diseases and conditions complicating pregnancy: Secondary | ICD-10-CM

## 2017-12-12 DIAGNOSIS — Z800 Family history of malignant neoplasm of digestive organs: Secondary | ICD-10-CM | POA: Insufficient documentation

## 2017-12-12 LAB — URINALYSIS, ROUTINE W REFLEX MICROSCOPIC
Bilirubin Urine: NEGATIVE
Glucose, UA: NEGATIVE mg/dL
Hgb urine dipstick: NEGATIVE
Ketones, ur: NEGATIVE mg/dL
Nitrite: NEGATIVE
Protein, ur: 30 mg/dL — AB
Specific Gravity, Urine: 1.017 (ref 1.005–1.030)
pH: 5 (ref 5.0–8.0)

## 2017-12-12 NOTE — MAU Note (Signed)
Onset of back and abdominal pain x 4 days, denies vaginal bleeding.

## 2017-12-12 NOTE — MAU Provider Note (Signed)
History   [id number] @ 27.3 wks in with c/o low back pain and pelvic pressure that started 4 days ago. Denies vag bleeding or ROM.  CSN: [id number]  Arrival date & time 12/12/17  1839   First Provider Initiated Contact with Patient 12/12/17 1854      Chief Complaint  Patient presents with  . Abdominal Pain  . Back Pain    HPI  Past Medical History:  Diagnosis Date  . Anxiety   . Asthma    inhaler PRN-hasn't used inhaler in months  . Attention deficit hyperactivity disorder   . Bipolar disorder [facility])    hospitalized as a teen for suicidal ideations and cutting  . Depression   . Gastritis   . GERD (gastroesophageal reflux disease)   . HPV (human papilloma virus) anogenital infection   . Hx of varicella   . Mild preeclampsia 02/09/2016  . Vaginal Pap smear, abnormal     Past Surgical History:  Procedure Laterality Date  . EYE MUSCLE SURGERY Bilateral    07/29/12  . MOUTH SURGERY    . PLANTAR FASCIA SURGERY    . WISDOM TOOTH EXTRACTION      Family History  Problem Relation Age of Onset  . Depression Mother   . Colon cancer Mother   . Colon polyps Mother   . Depression Father   . Depression Brother   . Liver disease Unknown   . Kidney disease Unknown     Social History   Tobacco Use  . Smoking status: Never Smoker  . Smokeless tobacco: Never Used  Substance Use Topics  . Alcohol use: No    Alcohol/week: 0.0 oz  . Drug use: No    OB History    Gravida Para Term Preterm AB Living   5 2 1 1 2 2    SAB TAB Ectopic Multiple Live Births   2     0 2      Review of Systems  Constitutional: Negative.   HENT: Negative.   Eyes: Negative.   Respiratory: Negative.   Cardiovascular: Negative.   Gastrointestinal:       Pelvic pressure  Endocrine: Negative.   Genitourinary: Negative.   Musculoskeletal: Positive for back pain.  Skin: Negative.   Allergic/Immunologic: Negative.   Neurological: Negative.   Hematological: Negative.   Psychiatric/Behavioral:  Negative.     Allergies  Depakote [divalproex sodium]; Methylphenidate derivatives; Neurontin [gabapentin]; and Prozac [fluoxetine hcl]  Home Medications    BP (!) 89/60   Pulse 82   Temp 98.7 F (37.1 C)   Resp 18   Ht 5\' 3"  (1.6 m)   Wt 196 lb 1.9 oz (89 kg)   SpO2 98%   BMI 34.74 kg/m   Physical Exam  Constitutional: She is oriented to person, place, and time. She appears well-developed and well-nourished.  HENT:  Head: Normocephalic.  Eyes: Pupils are equal, round, and reactive to light.  Neck: Normal range of motion.  Cardiovascular: Normal rate, normal heart sounds and intact distal pulses.  Pulmonary/Chest: Effort normal and breath sounds normal.  Abdominal: Soft. Bowel sounds are normal.  Genitourinary: Vagina normal and uterus normal.  Musculoskeletal: Normal range of motion.  Neurological: She is alert and oriented to person, place, and time. She has normal reflexes.  Skin: Skin is warm and dry.  Psychiatric: She has a normal mood and affect. Her behavior is normal. Judgment and thought content normal.    MAU Course  Procedures (including critical care time)  Labs  Reviewed  URINALYSIS, ROUTINE W REFLEX MICROSCOPIC   No results found.   1. Back pain complicating pregnancy in second trimester   2. Pelvic pressure in pregnancy, antepartum, second trimester       MDM  SVE cl/th/post high. FHR reassurring with accels, no decels. No uc's. abd soft and non tender. Will d/c home not in labor

## 2017-12-12 NOTE — Discharge Instructions (Signed)
Abdominal Pain During Pregnancy Belly (abdominal) pain is common during pregnancy. Most of the time, it is not a serious problem. Other times, it can be a sign that something is wrong with the pregnancy. Always tell your doctor if you have belly pain. Follow these instructions at home: Monitor your belly pain for any changes. The following actions may help you feel better:  Do not have sex (intercourse) or put anything in your vagina until you feel better.  Rest until your pain stops.  Drink clear fluids if you feel sick to your stomach (nauseous). Do not eat solid food until you feel better.  Only take medicine as told by your doctor.  Keep all doctor visits as told.  Get help right away if:  You are bleeding, leaking fluid, or pieces of tissue come out of your vagina.  You have more pain or cramping.  You keep throwing up (vomiting).  You have pain when you pee (urinate) or have blood in your pee.  You have a fever.  You do not feel your baby moving as much.  You feel very weak or feel like passing out.  You have trouble breathing, with or without belly pain.  You have a very bad headache and belly pain.  You have fluid leaking from your vagina and belly pain.  You keep having watery poop (diarrhea).  Your belly pain does not go away after resting, or the pain gets worse. This information is not intended to replace advice given to you by your health care provider. Make sure you discuss any questions you have with your health care provider. Document Released: 11/19/2009 Document Revised: 07/09/2016 Document Reviewed: 06/30/2013 Elsevier Interactive Patient Education  2018 [employer]vier Inc.  

## 2017-12-15 ENCOUNTER — Emergency Department (HOSPITAL_COMMUNITY)
Admission: EM | Admit: 2017-12-15 | Discharge: 2017-12-15 | Discharge status: Absent without leave | Payer: Medicare Other

## 2017-12-15 NOTE — ED Notes (Signed)
Pt called from lobby with no response x2

## 2017-12-15 NOTE — L&D Delivery Note (Addendum)
Patient is a 32 y.o. now [id number] s/p NSVD at [redacted]w[redacted]d, who was admitted for IOL for preE without severe features.  She progressed with/without augmentation to complete and pushed minutes to deliver.  Cord clamping delayed by several minutes then clamped by me with supervision and cut by father of the baby.  Placenta intact and spontaneous, bleeding minimal.  Few abrasions noted but no lacerations requiring repair. She requests Nexplanon for birth control.  Delivery Note At 6:48 PM a viable female was delivered via Vaginal, Spontaneous (Presentation: ROA) in usual fashion with father of the baby's participation.  APGAR: 9, 9; weight pending.  Placenta status: intact.  Cord: 3V with no complications.  Cord pH: N/A  Anesthesia:  Epidural Episiotomy: None Lacerations: None Suture Repair: None Est. Blood Loss (mL): 200  Mom to postpartum.  Baby to Couplet care / Skin to Skin.  Rory Percy, DO 02/19/18, 7:10 PM  I attest that I was gloved and present at the delivery, including delivery of the placenta. I agree with the above documentation and findings.  Maye Hides

## 2017-12-21 ENCOUNTER — Other Ambulatory Visit: Payer: Self-pay

## 2017-12-21 ENCOUNTER — Encounter (HOSPITAL_COMMUNITY): Payer: Self-pay

## 2017-12-21 ENCOUNTER — Inpatient Hospital Stay (HOSPITAL_COMMUNITY)
Admission: AD | Admit: 2017-12-21 | Discharge: 2017-12-21 | Disposition: A | Discharge status: Home / Self Care | Payer: Medicare Other | Source: Ambulatory Visit | Attending: Obstetrics and Gynecology | Admitting: Obstetrics and Gynecology

## 2017-12-21 DIAGNOSIS — R197 Diarrhea, unspecified: Secondary | ICD-10-CM | POA: Diagnosis not present

## 2017-12-21 DIAGNOSIS — Z3A27 27 weeks gestation of pregnancy: Secondary | ICD-10-CM | POA: Diagnosis not present

## 2017-12-21 DIAGNOSIS — O212 Late vomiting of pregnancy: Secondary | ICD-10-CM | POA: Insufficient documentation

## 2017-12-21 DIAGNOSIS — R102 Pelvic and perineal pain: Secondary | ICD-10-CM | POA: Diagnosis not present

## 2017-12-21 DIAGNOSIS — J399 Disease of upper respiratory tract, unspecified: Secondary | ICD-10-CM | POA: Diagnosis not present

## 2017-12-21 DIAGNOSIS — O26892 Other specified pregnancy related conditions, second trimester: Secondary | ICD-10-CM

## 2017-12-21 DIAGNOSIS — R112 Nausea with vomiting, unspecified: Secondary | ICD-10-CM

## 2017-12-21 DIAGNOSIS — R531 Weakness: Secondary | ICD-10-CM | POA: Diagnosis not present

## 2017-12-21 DIAGNOSIS — O26893 Other specified pregnancy related conditions, third trimester: Secondary | ICD-10-CM | POA: Diagnosis present

## 2017-12-21 DIAGNOSIS — R404 Transient alteration of awareness: Secondary | ICD-10-CM | POA: Diagnosis not present

## 2017-12-21 DIAGNOSIS — Z3A28 28 weeks gestation of pregnancy: Secondary | ICD-10-CM | POA: Diagnosis not present

## 2017-12-21 LAB — RAPID STREP SCREEN (MED CTR MEBANE ONLY): Streptococcus, Group A Screen (Direct): NEGATIVE

## 2017-12-21 LAB — CBC
HCT: 36.5 % (ref 36.0–46.0)
Hemoglobin: 12.1 g/dL (ref 12.0–15.0)
MCH: 30.4 pg (ref 26.0–34.0)
MCHC: 33.2 g/dL (ref 30.0–36.0)
MCV: 91.7 fL (ref 78.0–100.0)
Platelets: 151 10*3/uL (ref 150–400)
RBC: 3.98 MIL/uL (ref 3.87–5.11)
RDW: 13.9 % (ref 11.5–15.5)
WBC: 7.6 10*3/uL (ref 4.0–10.5)

## 2017-12-21 LAB — COMPREHENSIVE METABOLIC PANEL
ALT: 22 U/L (ref 14–54)
AST: 40 U/L (ref 15–41)
Albumin: 2.9 g/dL — ABNORMAL LOW (ref 3.5–5.0)
Alkaline Phosphatase: 87 U/L (ref 38–126)
Anion gap: 10 (ref 5–15)
BUN: 7 mg/dL (ref 6–20)
CO2: 19 mmol/L — ABNORMAL LOW (ref 22–32)
Calcium: 8.7 mg/dL — ABNORMAL LOW (ref 8.9–10.3)
Chloride: 104 mmol/L (ref 101–111)
Creatinine, Ser: 0.57 mg/dL (ref 0.44–1.00)
GFR calc Af Amer: >60 mL/min (ref >60)
GFR calc non Af Amer: >60 mL/min (ref >60)
Glucose, Bld: 91 mg/dL (ref 65–99)
Potassium: 5.6 mmol/L — ABNORMAL HIGH (ref 3.5–5.1)
Sodium: 133 mmol/L — ABNORMAL LOW (ref 135–145)
Total Bilirubin: 2.1 mg/dL — ABNORMAL HIGH (ref 0.3–1.2)
Total Protein: 6.2 g/dL — ABNORMAL LOW (ref 6.5–8.1)

## 2017-12-21 LAB — INFLUENZA PANEL BY PCR (TYPE A & B)
Influenza A By PCR: NEGATIVE
Influenza B By PCR: NEGATIVE

## 2017-12-21 LAB — URINALYSIS, ROUTINE W REFLEX MICROSCOPIC
Bilirubin Urine: NEGATIVE
Glucose, UA: NEGATIVE mg/dL
Hgb urine dipstick: NEGATIVE
Ketones, ur: 80 mg/dL — AB
Nitrite: NEGATIVE
Protein, ur: 30 mg/dL — AB
Specific Gravity, Urine: 1.017 (ref 1.005–1.030)
pH: 6 (ref 5.0–8.0)

## 2017-12-21 LAB — FETAL FIBRONECTIN: Fetal Fibronectin: NEGATIVE

## 2017-12-21 IMAGING — US US MFM OB DETAIL+14 WK
1 series · 13 of 28 positions shown · non-contrast
Comparison: none

[Series 1: us mfm ob detail+14 wk · 61 acquisitions, 13 frames shown]
[im 3/61]
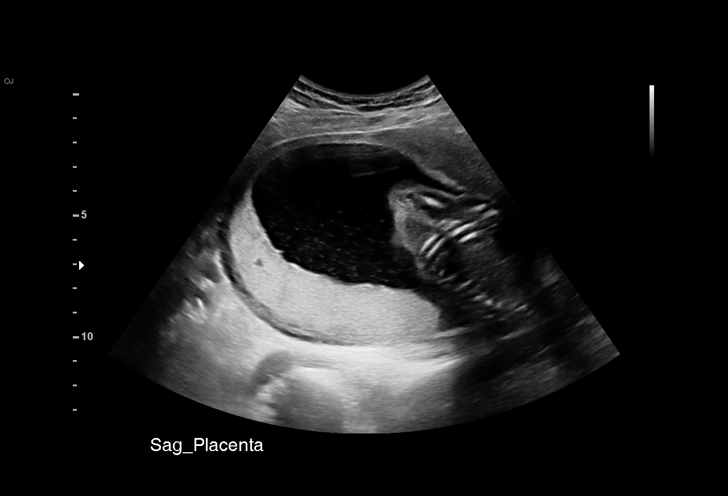
[im 7/61]
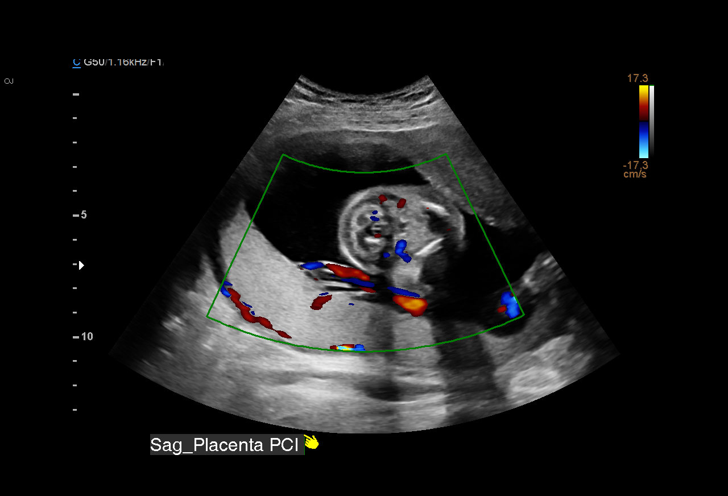
[im 12/61]
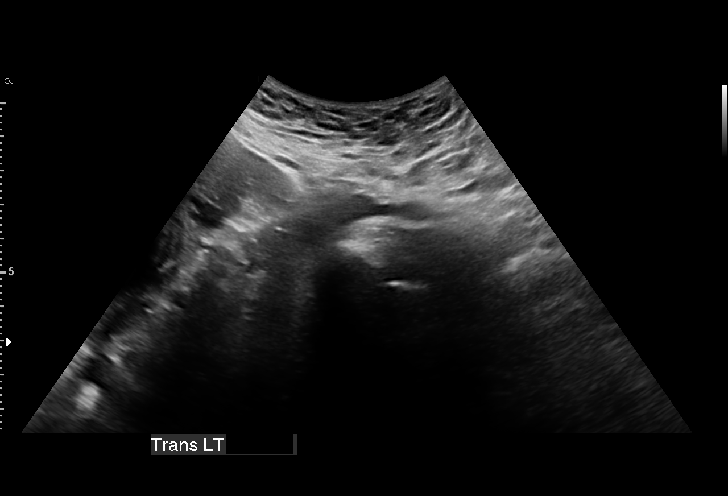
[im 16/61]
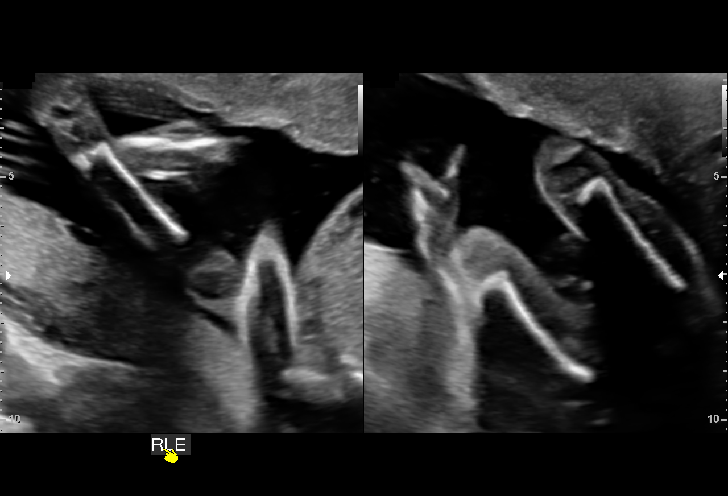
[im 21/61]
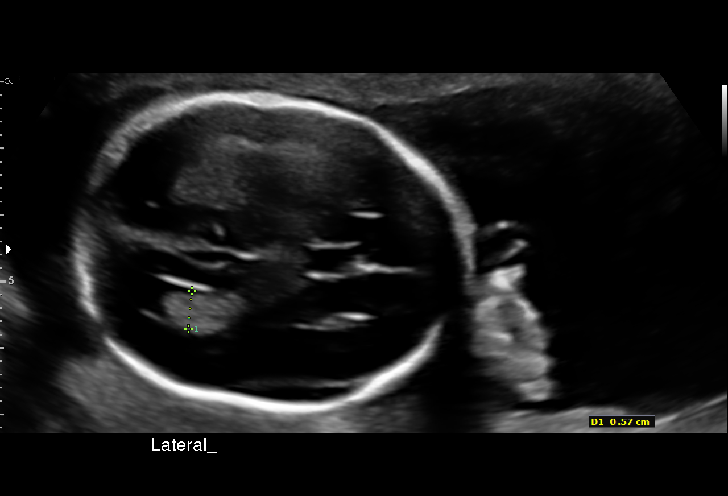
[im 25/61]
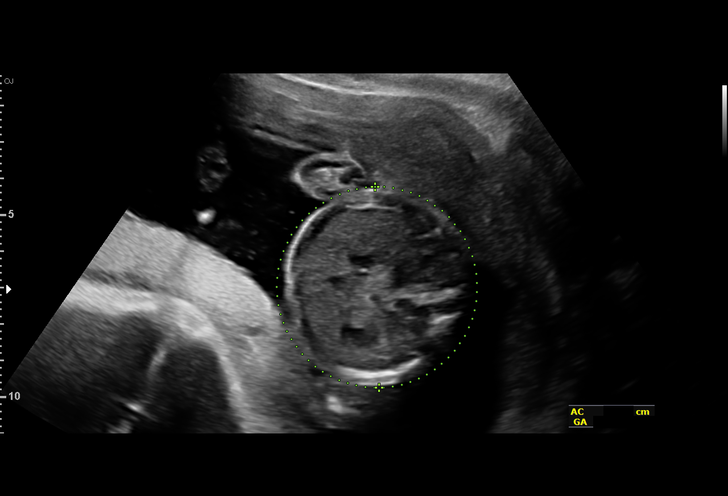
[im 32/61]
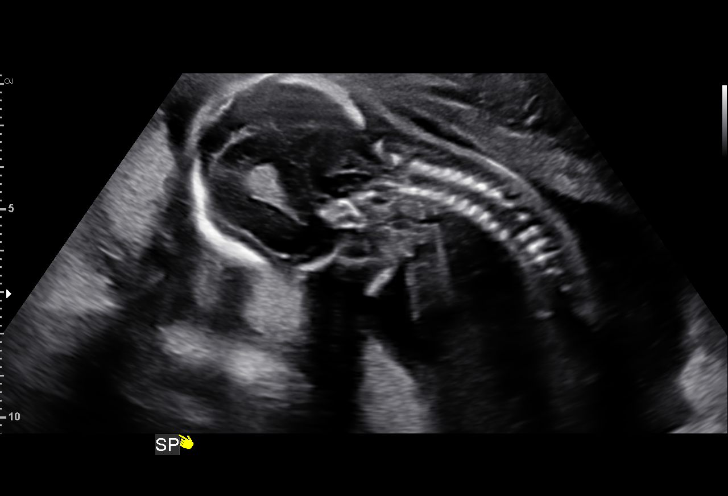
[im 36/61]
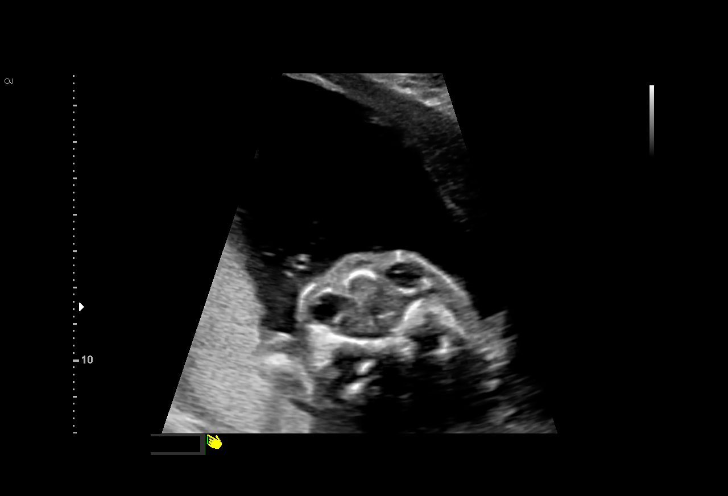
[im 41/61]
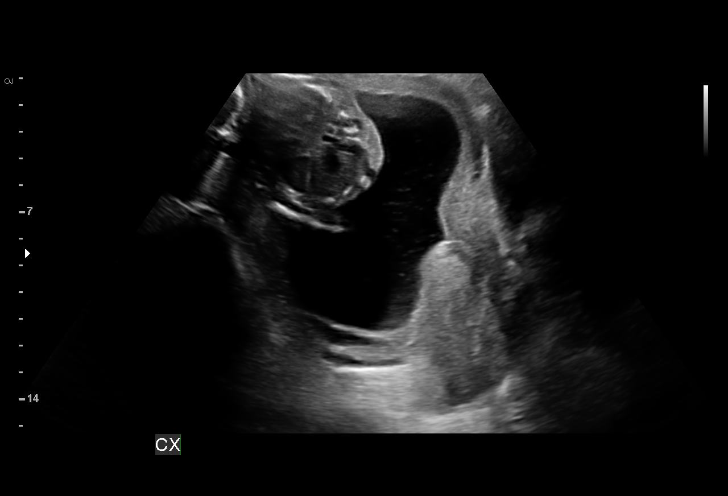
[im 45/61]
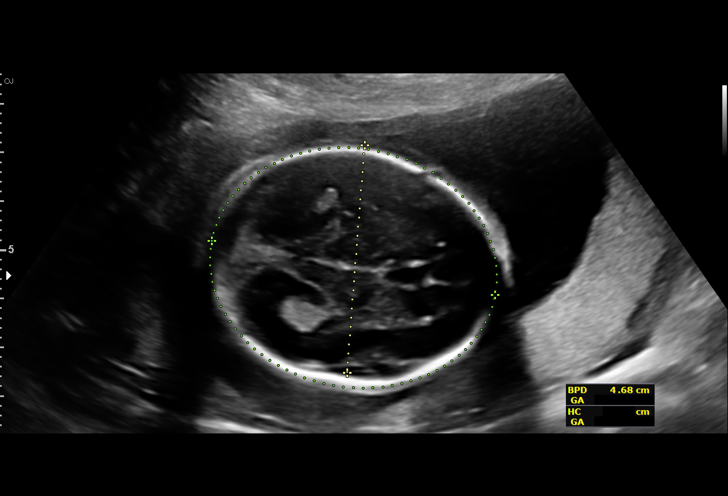
[im 49/61]
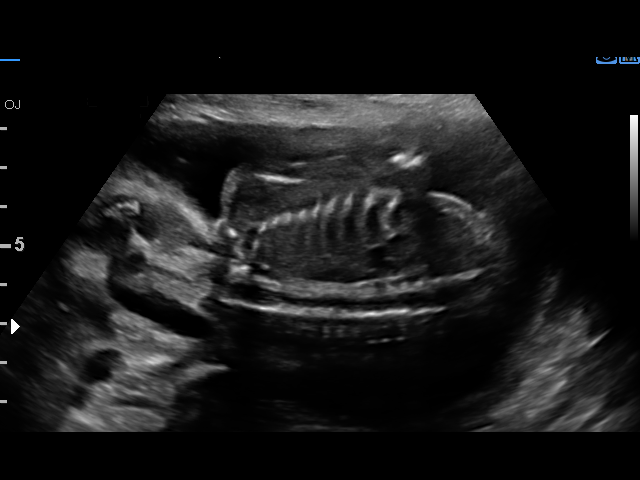
[im 54/61]
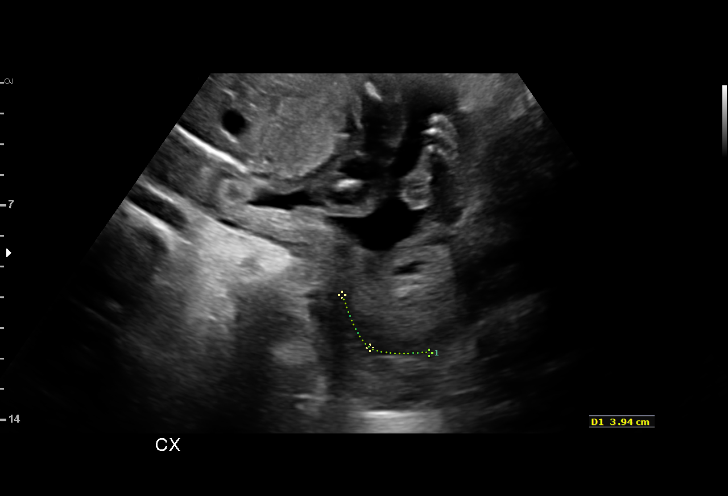
[im 58/61]
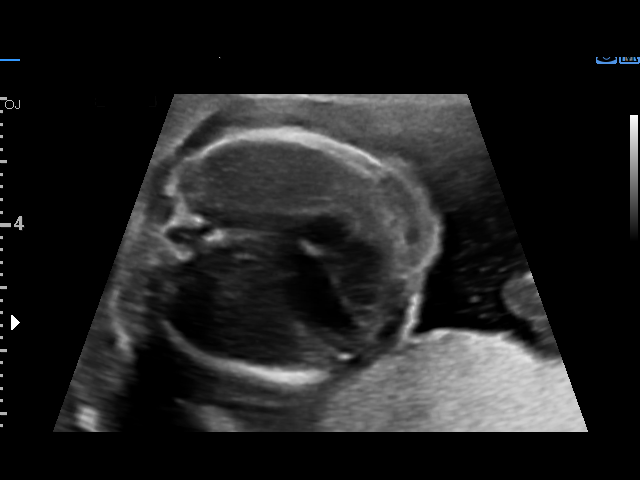

[13 of 28 positions shown; findings below may reference images not displayed]

Attending:        Bo Seon Sitohang       Secondary Phy.:   [facility]-
MAU/Triage

1  PAK KI HSIAO            [id number]      [id number]     [id number]
Indications

19 weeks gestation of pregnancy
Encounter for antenatal screening for
malformations
Obesity complicating pregnancy, second
trimester
Other mental disorder complicating
pregnancy, second trimester
Poor obstetric history: Previous
preeclampsia / eclampsia/gestational HTN
Asthma                                         TNN.QN [id number]
Poor obstetric history: Previous preterm
delivery, antepartum (32w8d)
Poor obstetric history: Previous gestational
diabetes
Short interval between pregancies, 2nd
trimester
OB History

Blood Type:            Height:  5'3"   Weight (lb):  195       BMI:
Gravidity:    5         Term:   1        Prem:   1        SAB:   2
TOP:          0       Ectopic:  0        Living: 2
Fetal Evaluation

Num Of Fetuses:     1
Cardiac Activity:   Observed
Presentation:       Breech
Placenta:           Posterior, above cervical os
P. Cord Insertion:  Visualized
Amniotic Fluid
AFI FV:      Subjectively within normal limits

Largest Pocket(cm)
5.9
Biometry

BPD:      46.4  mm     G. Age:  20w 0d         64  %    CI:        74.83   %    70 - 86
FL/HC:      18.2   %    16.8 -
HC:      170.2  mm     G. Age:  19w 4d         39  %    HC/AC:      0.99        1.09 -
AC:      172.3  mm     G. Age:  22w 1d       > 97  %    FL/BPD:     66.8   %
FL:         31  mm     G. Age:  19w 4d         39  %    FL/AC:      18.0   %    20 - 24
CER:      21.5  mm     G. Age:  20w 4d         63  %
NFT:       2.9  mm

CM:        4.8  mm

Est. FW:     381  gm    0 lb 13 oz      63  %
Gestational Age

U/S Today:     20w 2d                                        EDD:   03/06/18
Best:          19w 5d     Det. By:  Early Ultrasound         EDD:   03/10/18
(07/16/17)
Anatomy

Cranium:               Appears normal         Aortic Arch:            Appears normal
Cavum:                 Appears normal         Ductal Arch:            Appears normal
Ventricles:            Appears normal         Diaphragm:              Appears normal
Choroid Plexus:        Appears normal         Stomach:                Appears normal, left
sided
Cerebellum:            Appears normal         Abdomen:                Appears normal
Posterior Fossa:       Appears normal         Abdominal Wall:         Not well visualized
Nuchal Fold:           Appears normal         Cord Vessels:           Appears normal (3
vessel cord)
Face:                  Appears normal         Kidneys:                Appear normal
(orbits and profile)
Lips:                  Appears normal         Bladder:                Appears normal
Thoracic:              Appears normal         Spine:                  Not well visualized
Heart:                 Not well visualized    Upper Extremities:      Appears normal
RVOT:                  Not well visualized    Lower Extremities:      Appears normal
LVOT:                  Appears normal

Other:  Fetus appears to be a male. Technically difficult due to maternal
habitus and fetal position.
Cervix Uterus Adnexa

Cervix
Length:            3.9  cm.
Normal appearance by transabdominal scan.

Uterus
No abnormality visualized.

Left Ovary
Not visualized. No adnexal mass visualized.
Right Ovary
Size(cm)       2.9  x   2.6    x  1.4       Vol(ml):
Within normal limits.

Cul De Sac:   No free fluid seen.

Adnexa:       No abnormality visualized.
Impression

SIUP at 89w6d
active fetus
EFW 63rd%'le
no dysmorphic features
limited views as documented above
no previa
Recommendations

Interval growth and completion of anatomy in 6 weeks.

## 2017-12-21 MED ORDER — METOCLOPRAMIDE HCL 10 MG PO TABS: 10.0000 mg | ORAL_TABLET | Freq: Four times a day (QID) | ORAL | 0 refills | Status: DC

## 2017-12-21 MED ORDER — DEXTROSE 5 % IN LACTATED RINGERS IV BOLUS
1000.0000 mL | Freq: Once | INTRAVENOUS | Status: AC
  Administered 2017-12-21: 1000 mL via INTRAVENOUS

## 2017-12-21 MED ORDER — METOCLOPRAMIDE HCL 5 MG/ML IJ SOLN
10.0000 mg | Freq: Once | INTRAMUSCULAR | Status: AC
  Administered 2017-12-21: 10 mg via INTRAVENOUS
  Filled 2017-12-21: qty 2

## 2017-12-21 NOTE — Discharge Instructions (Signed)
Upper Respiratory Infection, Adult Most upper respiratory infections (URIs) are caused by a virus. A URI affects the nose, throat, and upper air passages. The most common type of URI is often called "the common cold." Follow these instructions at home:  Take medicines only as told by your doctor.  Gargle warm saltwater or take cough drops to comfort your throat as told by your doctor.  Use a warm mist humidifier or inhale steam from a shower to increase air moisture. This may make it easier to breathe.  Drink enough fluid to keep your pee (urine) clear or pale yellow.  Eat soups and other clear broths.  Have a healthy diet.  Rest as needed.  Go back to work when your fever is gone or your doctor says it is okay. ? You may need to stay home longer to avoid giving your URI to others. ? You can also wear a face mask and wash your hands often to prevent spread of the virus.  Use your inhaler more if you have asthma.  Do not use any tobacco products, including cigarettes, chewing tobacco, or electronic cigarettes. If you need help quitting, ask your doctor. Contact a doctor if:  You are getting worse, not better.  Your symptoms are not helped by medicine.  You have chills.  You are getting more short of breath.  You have brown or red mucus.  You have yellow or brown discharge from your nose.  You have pain in your face, especially when you bend forward.  You have a fever.  You have puffy (swollen) neck glands.  You have pain while swallowing.  You have white areas in the back of your throat. Get help right away if:  You have very bad or constant: ? Headache. ? Ear pain. ? Pain in your forehead, behind your eyes, and over your cheekbones (sinus pain). ? Chest pain.  You have long-lasting (chronic) lung disease and any of the following: ? Wheezing. ? Long-lasting cough. ? Coughing up blood. ? A change in your usual mucus.  You have a stiff neck.  You have  changes in your: ? Vision. ? Hearing. ? Thinking. ? Mood. This information is not intended to replace advice given to you by your health care provider. Make sure you discuss any questions you have with your health care provider. Document Released: 05/19/2008 Document Revised: 08/03/2016 Document Reviewed: 03/08/2014 Elsevier Interactive Patient Education  2018 Elsevier Inc.  

## 2017-12-21 NOTE — MAU Note (Signed)
Patient states she's had "nausea, vomiting, diarrhea, body aches and chills, stuffy nose and sore throat."  Pt also states having contractions that started last night and are now having 1-2 CTX/hr.

## 2017-12-21 NOTE — Progress Notes (Signed)
   12/21/17 1810  [REMOVED] Peripheral IV 12/21/17 Right Forearm  Removal Date/Time: 12/21/17 1811  Placement Date/Time: 12/21/17 1733   Ultrasound Used?: No  Size (Gauge): 20 G  Orientation: Right  Location: Forearm  Site Prep: Chlorhexidine;Skin Prep Completely Dry at the Time of First Skin Puncture  Local Anesthe...  Site Assessment Clean;Dry;Intact;Painful  Line Status Infusing  Dressing Type Transparent  Dressing Status Clean;Dry;Intact  Interventions Removed  E Lawerence, NP aware of pt request to remove IV due to pain at site. Reported that approx 963ml IVF infused; ordered reglan given.  Orders rec'd to d/c IV & not replace.  Pt aware of plan, awaiting lab results.

## 2017-12-21 NOTE — MAU Provider Note (Signed)
Chief Complaint:  Contractions and flu symptoms   First Provider Initiated Contact with Patient 12/21/17 1556     HPI: [name] is a 32 y.o. [id number] at [redacted]w[redacted]d who presents to maternity admissions reporting flu-like symptoms, including muscle aches, subjective fevers, nausea/vomiting, and diarrhea.  Per patient, she "hasn't been able to keep anything down" including her prescribed Phenergan for several days.  She denies sick contacts or recent travel. She is also concerned that she has been feeling more contractions and less fetal movement recently.   She denies LOF, vaginal bleeding, vaginal itching/burning, urinary symptoms, h/a, dizziness, or constipation.  She denies headache, visual changes or RUQ abdominal pain.   Past Medical History: Past Medical History:  Diagnosis Date  . Anxiety   . Asthma    inhaler PRN-hasn't used inhaler in months  . Attention deficit hyperactivity disorder   . Bipolar disorder [facility])    hospitalized as a teen for suicidal ideations and cutting  . Depression   . Gastritis   . GERD (gastroesophageal reflux disease)   . HPV (human papilloma virus) anogenital infection   . Hx of varicella   . Mild preeclampsia 02/09/2016  . Vaginal Pap smear, abnormal     Past obstetric history: OB History  Gravida Para Term Preterm AB Living  3 2 1 1  0 2  SAB TAB Ectopic Multiple Live Births  0     0 2    # Outcome Date GA Lbr Len/2nd Weight Sex Delivery Anes PTL Lv  3 Current           2 Preterm 02/03/17 [redacted]w[redacted]d  3.4 kg (7 lb 7.9 oz) M Vag-Spont EPI  LIV  1 Term 02/11/16 [redacted]w[redacted]d / 01:14 3.702 kg (8 lb 2.6 oz) F Vag-Spont EPI  LIV      Past Surgical History: Past Surgical History:  Procedure Laterality Date  . EYE MUSCLE SURGERY Bilateral    07/29/12  . MOUTH SURGERY    . PLANTAR FASCIA SURGERY    . WISDOM TOOTH EXTRACTION      Family History: Family History  Problem Relation Age of Onset  . Depression Mother   . Colon cancer Mother   . Colon  polyps Mother   . Depression Father   . Depression Brother   . Liver disease Unknown   . Kidney disease Unknown     Social History: Social History   Tobacco Use  . Smoking status: Never Smoker  . Smokeless tobacco: Never Used  Substance Use Topics  . Alcohol use: No    Alcohol/week: 0.0 oz  . Drug use: No    Allergies:  Allergies  Allergen Reactions  . Depakote [Divalproex Sodium] Other (See Comments)    Pt states that this medication makes her blood levels toxic.    [name] Methylphenidate Derivatives Other (See Comments)    Reaction:  Depression and anger   . Neurontin [Gabapentin] Other (See Comments)    Reaction:  Dizziness   . Prozac [Fluoxetine Hcl] Other (See Comments)    Reaction:  Anger     Meds:  No medications prior to admission.    I have reviewed patient's Past Medical Hx, Surgical Hx, Family Hx, Social Hx, medications and allergies.   ROS:  Other systems negative  Physical Exam   Patient Vitals for the past 24 hrs:  BP Temp Temp src Pulse Resp SpO2 Weight  12/21/17 1907 114/89 -- -- (!) 104 -- -- --  12/21/17 1906 -- 98.4 F (36.9  C) Oral (!) 104 18 -- --  12/21/17 1747 -- -- -- 91 -- 98 % --  12/21/17 1513 115/74 98.4 F (36.9 C) Oral (!) 122 18 99 % 87.1 kg (192 lb)   Constitutional: Well-developed, well-nourished female in no acute distress.  Cardiovascular: normal rate and rhythm Respiratory: normal effort, clear to auscultation bilaterally GI: Abd soft, non-tender, gravid appropriate for gestational age.  No rebound or guarding. MS: Extremities nontender, no edema, normal ROM Neurologic: Alert and oriented x 4.  GU: Neg CVAT.  FHT:  Baseline 140 , moderate variability, accelerations present, no decelerations. Contractions: Rare   Labs: Results for orders placed or performed during the hospital encounter of 12/21/17 (from the past 24 hour(s))  Urinalysis, Routine w reflex microscopic     Status: Abnormal   Collection Time: 12/21/17  3:10  PM  Result Value Ref Range   Color, Urine YELLOW YELLOW   APPearance HAZY (A) CLEAR   Specific Gravity, Urine 1.017 1.005 - 1.030   pH 6.0 5.0 - 8.0   Glucose, UA NEGATIVE NEGATIVE mg/dL   Hgb urine dipstick NEGATIVE NEGATIVE   Bilirubin Urine NEGATIVE NEGATIVE   Ketones, ur 80 (A) NEGATIVE mg/dL   Protein, ur 30 (A) NEGATIVE mg/dL   Nitrite NEGATIVE NEGATIVE   Leukocytes, UA SMALL (A) NEGATIVE   RBC / HPF 0-5 0 - 5 RBC/hpf   WBC, UA 6-30 0 - 5 WBC/hpf   Bacteria, UA RARE (A) NONE SEEN   Squamous Epithelial / LPF 6-30 (A) NONE SEEN   Mucus PRESENT   Influenza panel by PCR (type A & B)     Status: None   Collection Time: 12/21/17  4:01 PM  Result Value Ref Range   Influenza A By PCR NEGATIVE NEGATIVE   Influenza B By PCR NEGATIVE NEGATIVE  Rapid Strep Screen (Not at [facility])     Status: None   Collection Time: 12/21/17  4:01 PM  Result Value Ref Range   Streptococcus, Group A Screen (Direct) NEGATIVE NEGATIVE  Fetal fibronectin     Status: None   Collection Time: 12/21/17  4:55 PM  Result Value Ref Range   Fetal Fibronectin NEGATIVE NEGATIVE  Comprehensive metabolic panel     Status: Abnormal   Collection Time: 12/21/17  5:16 PM  Result Value Ref Range   Sodium 133 (L) 135 - 145 mmol/L   Potassium 5.6 (H) 3.5 - 5.1 mmol/L   Chloride 104 101 - 111 mmol/L   CO2 19 (L) 22 - 32 mmol/L   Glucose, Bld 91 65 - 99 mg/dL   BUN 7 6 - 20 mg/dL   Creatinine, Ser 0.57 0.44 - 1.00 mg/dL   Calcium 8.7 (L) 8.9 - 10.3 mg/dL   Total Protein 6.2 (L) 6.5 - 8.1 g/dL   Albumin 2.9 (L) 3.5 - 5.0 g/dL   AST 40 15 - 41 U/L   ALT 22 14 - 54 U/L   Alkaline Phosphatase 87 38 - 126 U/L   Total Bilirubin 2.1 (H) 0.3 - 1.2 mg/dL   GFR calc non Af Amer >60 >60 mL/min   GFR calc Af Amer >60 >60 mL/min   Anion gap 10 5 - 15  CBC     Status: None   Collection Time: 12/21/17  5:16 PM  Result Value Ref Range   WBC 7.6 4.0 - 10.5 K/uL   RBC 3.98 3.87 - 5.11 MIL/uL   Hemoglobin 12.1 12.0 - 15.0 g/dL    HCT 36.5 36.0 -  46.0 %   MCV 91.7 78.0 - 100.0 fL   MCH 30.4 26.0 - 34.0 pg   MCHC 33.2 30.0 - 36.0 g/dL   RDW 13.9 11.5 - 15.5 %   Platelets 151 150 - 400 K/uL   --/--/O POS (02/19 1907)  Imaging:  [place] Mfm Ob Follow Up  Result Date: 11/30/2017 ----------------------------------------------------------------------  OBSTETRICS REPORT                        (Corrected Final 11/30/2017 03:04                                                                          pm) ---------------------------------------------------------------------- Patient Info  ID #:       [id number]                          D.O.B.:  07/13/1986 (31 yrs)  Name:       Esperanza Richters Pettus              Visit Date: 11/30/2017 01:03 pm ---------------------------------------------------------------------- Performed By  Performed By:     Rodrigo Ran BS      Ref. Address:     [address]                                                             [address]  Attending:        Griffin Dakin MD         Location:         [facility]  Referred By:      Jorje Guild                    CNM ---------------------------------------------------------------------- Orders   #  Description                                 Code   1  [place] MFM OB FOLLOW UP                         [phone]  ----------------------------------------------------------------------   #  Ordered By               Order #        Accession #    Episode #   1  Arlina Robes            [id number]      [id number]     [id number]  ---------------------------------------------------------------------- Indications   [redacted] weeks gestation of pregnancy                H2D.92   Obesity complicating pregnancy, second         O99.212   trimester   Other mental disorder complicating  O99.340   pregnancy, second trimester   Poor obstetric history: Previous               O09.299   preeclampsia / eclampsia/gestational   HTN/GDM   Asthma                                          O99.89 j45.909   Poor obstetric history: Previous preterm       O09.219   delivery, antepartum ([redacted]w[redacted]d)   Short interval between pregancies, 2nd         O09.892   trimester  ---------------------------------------------------------------------- OB History  Blood Type:            Height:  5'3"   Weight (lb):  195       BMI:  34.54  Gravidity:    5         Term:   1        Prem:   1        SAB:   2  TOP:          0       Ectopic:  0        Living: 2 ---------------------------------------------------------------------- Fetal Evaluation  Num Of Fetuses:     1  Fetal Heart         157  Rate(bpm):  Cardiac Activity:   Observed  Presentation:       Breech  Placenta:           Posterior, above cervical os  P. Cord Insertion:  Visualized  Amniotic Fluid  AFI FV:      Subjectively within normal limits                              Largest Pocket(cm)                              7.17 ---------------------------------------------------------------------- Biometry  BPD:      66.4  mm     G. Age:  26w 5d         76  %    CI:        71.82   %    70 - 86                                                          FL/HC:      18.1   %    18.6 - 20.4  HC:      249.4  mm     G. Age:  27w 0d         74  %    HC/AC:      1.01        1.04 - 1.22  AC:      246.5  mm     G. Age:  28w 6d       > 97  %    FL/BPD:     68.1   %    71 - 87  FL:       45.2  mm  G. Age:  25w 0d         17  %    FL/AC:      18.3   %    20 - 24  HUM:        43  mm     G. Age:  25w 5d         45  %  CER:      28.8  mm     G. Age:  25w 6d         51  %  CM:        4.9  mm  Est. FW:    1049  gm      2 lb 5 oz     78  % ---------------------------------------------------------------------- Gestational Age  U/S Today:     26w 6d                                        EDD:   03/02/18  Best:          25w 5d     Det. ByLoman Chroman         EDD:   03/10/18                                      (07/16/17)  ---------------------------------------------------------------------- Anatomy  Cranium:               Appears normal         Aortic Arch:            Appears normal  Cavum:                 Appears normal         Ductal Arch:            Appears normal  Ventricles:            Appears normal         Diaphragm:              Appears normal  Choroid Plexus:        Appears normal         Stomach:                Appears normal, left                                                                        sided  Cerebellum:            Appears normal         Abdomen:                Appears normal  Posterior Fossa:       Appears normal         Abdominal Wall:         Appears nml (cord  insert, abd wall)  Nuchal Fold:           Previously seen        Cord Vessels:           Appears normal (3                                                                        vessel cord)  Face:                  Appears normal         Kidneys:                Appear normal                         (orbits and profile)  Lips:                  Appears normal         Bladder:                Appears normal  Thoracic:              Appears normal         Spine:                  Appears normal  Heart:                 Appears normal         Upper Extremities:      Previously seen                         (4CH, axis, and situs  RVOT:                  Appears normal         Lower Extremities:      Previously seen  LVOT:                  Appears normal  Other:  Fetus appears to be a female. Heels visualized. ---------------------------------------------------------------------- Cervix Uterus Adnexa  Cervix  Length:            4.3  cm.  Normal appearance by transabdominal scan.  Uterus  No abnormality visualized.  Left Ovary  Within normal limits.  Right Ovary  Within normal limits.  Cul De Sac:   No free fluid seen.  Adnexa:       No abnormality visualized.  ---------------------------------------------------------------------- Impression  Singleton intrauterine pregnancy at 25+5 weeks  Review of the anatomy shows no sonographic markers for  aneuploidy or structural anomalies  All relevant anatomy has been visualized  Amniotic fluid volume is normal  Estimated fetal weight is 1049g which is growth in the 78th  percentile  Cervix is normal on transabdominal views ---------------------------------------------------------------------- Recommendations  Recommend follow-up ultrasound examination in 6 weeks to  assess fetal browth ----------------------------------------------------------------------                      Griffin Dakin, MD Electronically Signed Corrected Final Report  11/30/2017 03:04 pm ----------------------------------------------------------------------   MAU Course/MDM: Patient arrived in MAU  complaining of flu-like symptoms, n/v, and diarrhea.  Flu swab was negative, rapid strep was pending at time of discharge.  Patient asked for a ginger ale with crackers approximately an hour after arriving, tolerated food well.   UA was positive forketones and protein, small leukocyte; results consistent with dehydration. An IV fluid bolus was started, but stopped before completion because of patient agitation. Patient was unable to procure a ride home, so was given a taxi voucher.   I have ordered labs and reviewed results.  NST reviewed, reassuring.  Assessment: 1. Upper respiratory disease   2. Nausea vomiting and diarrhea   3. [redacted] weeks gestation of pregnancy    Plan: Discharge home Labor precautions and fetal kick counts Follow up in Office for prenatal visits and recheck  Pt stable at time of discharge.

## 2017-12-24 LAB — CULTURE, GROUP A STREP (THRC)

## 2017-12-25 ENCOUNTER — Other Ambulatory Visit: Payer: Medicare Other

## 2017-12-25 ENCOUNTER — Encounter: Payer: Medicare Other | Admitting: Advanced Practice Midwife

## 2017-12-25 ENCOUNTER — Encounter: Payer: Self-pay | Admitting: Advanced Practice Midwife

## 2018-01-01 ENCOUNTER — Encounter: Payer: Medicare Other | Admitting: Nurse Practitioner

## 2018-01-01 ENCOUNTER — Other Ambulatory Visit: Payer: Medicare Other

## 2018-01-06 ENCOUNTER — Encounter (HOSPITAL_COMMUNITY): Payer: Self-pay | Admitting: *Deleted

## 2018-01-06 ENCOUNTER — Inpatient Hospital Stay (HOSPITAL_COMMUNITY)
Admission: AD | Admit: 2018-01-06 | Discharge: 2018-01-06 | Disposition: A | Discharge status: Home / Self Care | Payer: Medicare Other | Source: Ambulatory Visit | Attending: Obstetrics & Gynecology | Admitting: Obstetrics & Gynecology

## 2018-01-06 ENCOUNTER — Other Ambulatory Visit: Payer: Self-pay

## 2018-01-06 DIAGNOSIS — Z8379 Family history of other diseases of the digestive system: Secondary | ICD-10-CM | POA: Insufficient documentation

## 2018-01-06 DIAGNOSIS — Z79899 Other long term (current) drug therapy: Secondary | ICD-10-CM | POA: Diagnosis not present

## 2018-01-06 DIAGNOSIS — K219 Gastro-esophageal reflux disease without esophagitis: Secondary | ICD-10-CM | POA: Insufficient documentation

## 2018-01-06 DIAGNOSIS — Z8371 Family history of colonic polyps: Secondary | ICD-10-CM | POA: Insufficient documentation

## 2018-01-06 DIAGNOSIS — Z7982 Long term (current) use of aspirin: Secondary | ICD-10-CM | POA: Insufficient documentation

## 2018-01-06 DIAGNOSIS — Z841 Family history of disorders of kidney and ureter: Secondary | ICD-10-CM | POA: Diagnosis not present

## 2018-01-06 DIAGNOSIS — O368130 Decreased fetal movements, third trimester, not applicable or unspecified: Secondary | ICD-10-CM | POA: Insufficient documentation

## 2018-01-06 DIAGNOSIS — Z3A31 31 weeks gestation of pregnancy: Secondary | ICD-10-CM | POA: Diagnosis not present

## 2018-01-06 DIAGNOSIS — Z818 Family history of other mental and behavioral disorders: Secondary | ICD-10-CM | POA: Insufficient documentation

## 2018-01-06 DIAGNOSIS — O99613 Diseases of the digestive system complicating pregnancy, third trimester: Secondary | ICD-10-CM | POA: Insufficient documentation

## 2018-01-06 DIAGNOSIS — O4703 False labor before 37 completed weeks of gestation, third trimester: Secondary | ICD-10-CM

## 2018-01-06 DIAGNOSIS — R109 Unspecified abdominal pain: Secondary | ICD-10-CM | POA: Diagnosis present

## 2018-01-06 DIAGNOSIS — Z888 Allergy status to other drugs, medicaments and biological substances status: Secondary | ICD-10-CM | POA: Diagnosis not present

## 2018-01-06 DIAGNOSIS — O99343 Other mental disorders complicating pregnancy, third trimester: Secondary | ICD-10-CM | POA: Insufficient documentation

## 2018-01-06 DIAGNOSIS — Z800 Family history of malignant neoplasm of digestive organs: Secondary | ICD-10-CM | POA: Insufficient documentation

## 2018-01-06 DIAGNOSIS — Z9889 Other specified postprocedural states: Secondary | ICD-10-CM | POA: Diagnosis not present

## 2018-01-06 DIAGNOSIS — F419 Anxiety disorder, unspecified: Secondary | ICD-10-CM | POA: Insufficient documentation

## 2018-01-06 DIAGNOSIS — E860 Dehydration: Secondary | ICD-10-CM

## 2018-01-06 DIAGNOSIS — F319 Bipolar disorder, unspecified: Secondary | ICD-10-CM | POA: Diagnosis not present

## 2018-01-06 DIAGNOSIS — Z8619 Personal history of other infectious and parasitic diseases: Secondary | ICD-10-CM | POA: Diagnosis not present

## 2018-01-06 LAB — URINALYSIS, ROUTINE W REFLEX MICROSCOPIC
Glucose, UA: NEGATIVE mg/dL
Hgb urine dipstick: NEGATIVE
Ketones, ur: 15 mg/dL — AB
Leukocytes, UA: NEGATIVE
Nitrite: NEGATIVE
Protein, ur: 30 mg/dL — AB
Specific Gravity, Urine: >1.03 — ABNORMAL HIGH (ref 1.005–1.030)
pH: 6 (ref 5.0–8.0)

## 2018-01-06 LAB — URINALYSIS, MICROSCOPIC (REFLEX): RBC / HPF: NONE SEEN RBC/hpf (ref 0–5)

## 2018-01-06 NOTE — Discharge Instructions (Signed)
You do have pubic symphysis pain which is normal when you have had several pregnancies. Drink 8 glasses of water every day. Keep your appointment in the office on Friday.  You need to have testing done that we do not do in Maternity Admissions. Return if you have vaginal bleeding or leaking of fluid.

## 2018-01-06 NOTE — MAU Provider Note (Signed)
History     CSN: [id number]  Arrival date and time: 01/06/18 2005   First Provider Initiated Contact with Patient 01/06/18 2038      Chief Complaint  Patient presents with  . Contractions   HPI Female R [name] 32 y.o. [redacted]w[redacted]d  Comes to MAU tonight as she started having contractions while at [employer].  Had one baby premature and comes for evaluation.  Has missed her last 2 clinic appointments - states her mother died and she was not able to come to the office.  Reporting decreased fetal movement today.  OB History    Gravida Para Term Preterm AB Living   3 2 1 1  0 2   SAB TAB Ectopic Multiple Live Births   0     0 2      Past Medical History:  Diagnosis Date  . Anxiety   . Asthma    inhaler PRN-hasn't used inhaler in months  . Attention deficit hyperactivity disorder   . Bipolar disorder [facility])    hospitalized as a teen for suicidal ideations and cutting  . Depression   . Gastritis   . GERD (gastroesophageal reflux disease)   . HPV (human papilloma virus) anogenital infection   . Hx of varicella   . Mild preeclampsia 02/09/2016  . Vaginal Pap smear, abnormal     Past Surgical History:  Procedure Laterality Date  . EYE MUSCLE SURGERY Bilateral    07/29/12  . MOUTH SURGERY    . PLANTAR FASCIA SURGERY    . WISDOM TOOTH EXTRACTION      Family History  Problem Relation Age of Onset  . Depression Mother   . Colon cancer Mother   . Colon polyps Mother   . Depression Father   . Depression Brother   . Liver disease Unknown   . Kidney disease Unknown     Social History   Tobacco Use  . Smoking status: Never Smoker  . Smokeless tobacco: Never Used  Substance Use Topics  . Alcohol use: No    Alcohol/week: 0.0 oz  . Drug use: No    Allergies:  Allergies  Allergen Reactions  . Depakote [Divalproex Sodium] Other (See Comments)    Pt states that this medication makes her blood levels toxic.    [name] Methylphenidate Derivatives Other (See Comments)   Reaction:  Depression and anger   . Neurontin [Gabapentin] Other (See Comments)    Reaction:  Dizziness   . Prozac [Fluoxetine Hcl] Other (See Comments)    Reaction:  Anger     Medications Prior to Admission  Medication Sig Dispense Refill Last Dose  . chlorproMAZINE (THORAZINE) 25 MG tablet Take 25 mg 3 (three) times daily by mouth.    01/05/2018 at Unknown time  . metoCLOPramide (REGLAN) 10 MG tablet Take 1 tablet (10 mg total) by mouth every 6 (six) hours. 30 tablet 0 Past Week at Unknown time  . Prenatal Vit-Fe Fumarate-FA (PRENATAL MULTIVITAMIN) TABS tablet Take 1 tablet at bedtime by mouth.    01/05/2018 at Unknown time  . aspirin EC 81 MG tablet Take 1 tablet (81 mg total) by mouth daily. Take after 12 weeks for prevention of preeclampsia later in pregnancy (Patient not taking: Reported on 12/21/2017) 300 tablet 2 Not Taking at Unknown time  . terconazole (TERAZOL 7) 0.4 % vaginal cream Place 1 applicator vaginally at bedtime. (Patient not taking: Reported on 12/21/2017) 45 g 0 Completed Course at Unknown time    Review of Systems  Constitutional: Negative for fever.  Gastrointestinal: Negative for abdominal pain, diarrhea and vomiting.  Genitourinary: Positive for pelvic pain. Negative for vaginal bleeding and vaginal discharge.       Having Contractions   Physical Exam   Blood pressure 130/68, pulse 94, temperature 98.3 F (36.8 C), temperature source Oral, resp. rate 17, height 5\' 3"  (1.6 m), weight 196 lb (88.9 kg), SpO2 98 %, unknown if currently breastfeeding.  Physical Exam  Nursing note and vitals reviewed. Constitutional: She is oriented to person, place, and time. She appears well-developed and well-nourished.  HENT:  Head: Normocephalic.  Eyes: EOM are normal.  Neck: Neck supple.  GI: Soft. There is no tenderness. There is no rebound and no guarding.  On the fetal monitor - baseline is 135 with moderate variability.  No contractions felt by provider,  No contractions  seen on the monitor strip.  15x15 accels noted.  Client reports she is feeling movement.  No decelerations.  Reactive strip. Palpation of the pubic symphysis elicits the type of pelvic pain she has been having.  Genitourinary:  Genitourinary Comments: Speculum exam: Vagina - Small amount of pale yellow discharge, no odor Cervix - No contact bleeding Bimanual exam: Cervix internal os closed, thick FFN collected but not sent as cervix is closed.  Vertex that is ballotable was palpated. Chaperone present for exam.   Musculoskeletal: Normal range of motion.  Neurological: She is alert and oriented to person, place, and time.  Skin: Skin is warm and dry.  Psychiatric: She has a normal mood and affect.    MAU Course  Procedures Results for orders placed or performed during the hospital encounter of 01/06/18 (from the past 24 hour(s))  Urinalysis, Routine w reflex microscopic     Status: Abnormal   Collection Time: 01/06/18  9:42 PM  Result Value Ref Range   Color, Urine YELLOW YELLOW   APPearance CLEAR CLEAR   Specific Gravity, Urine >1.030 (H) 1.005 - 1.030   pH 6.0 5.0 - 8.0   Glucose, UA NEGATIVE NEGATIVE mg/dL   Hgb urine dipstick NEGATIVE NEGATIVE   Bilirubin Urine SMALL (A) NEGATIVE   Ketones, ur 15 (A) NEGATIVE mg/dL   Protein, ur 30 (A) NEGATIVE mg/dL   Nitrite NEGATIVE NEGATIVE   Leukocytes, UA NEGATIVE NEGATIVE  Urinalysis, Microscopic (reflex)     Status: Abnormal   Collection Time: 01/06/18  9:42 PM  Result Value Ref Range   RBC / HPF NONE SEEN 0 - 5 RBC/hpf   WBC, UA 0-5 0 - 5 WBC/hpf   Bacteria, UA FEW (A) NONE SEEN   Squamous Epithelial / LPF 0-5 (A) NONE SEEN   Mucus PRESENT     MDM Client encouraged to drink more PO fluids.  Her contractions resolved after rest in bed and PO fluids.  Encouraged her to drink fluids and rest to see if the contractions would stop prior to coming in to MAU.  Client left prior to UA being run.  She could not give a urine specimen  until after she drank a large container of water. Client was worried about the pelvic pain she is having but discussed how normal it is to have pubic symphysis pain like she has now in 3rd trimester.  Assessment and Plan  Threatened preterm labor Pubic symphysis pain Reactive NST - came in with decreased fetal movement Has missed 2 prenatal visits Difficult social situation - mother died recently - tonight she had transportation to go home and did not need a  taxi voucher.   Plan Drink at least 8 8-oz glasses of water every day. Drink 2 glasses of water if having contractions at home. Keep your appointment in the office on Friday. Consider the type of contraception you want to use after the baby is born and make sure to attend your postpartum visit in the office.   Jaylene Arrowood L Jakell Trusty 01/06/2018, 8:57 PM

## 2018-01-06 NOTE — MAU Note (Signed)
CTX beginning around 1900- approximately 5 mins apart.  No LOF/VB.  Reports some decreased FM for past 2 days.  Last baby was born at 80 weeks.

## 2018-01-08 ENCOUNTER — Other Ambulatory Visit: Payer: Medicare Other

## 2018-01-11 ENCOUNTER — Inpatient Hospital Stay (HOSPITAL_COMMUNITY)
Admission: AD | Admit: 2018-01-11 | Discharge: 2018-01-11 | Disposition: A | Discharge status: Home / Self Care | Payer: Medicare Other | Source: Ambulatory Visit | Attending: Obstetrics and Gynecology | Admitting: Obstetrics and Gynecology

## 2018-01-11 ENCOUNTER — Ambulatory Visit (HOSPITAL_COMMUNITY)
Admission: RE | Admit: 2018-01-11 | Discharge: 2018-01-11 | Disposition: A | Discharge status: Home / Self Care | Payer: Medicare Other | Source: Ambulatory Visit | Attending: Obstetrics and Gynecology | Admitting: Obstetrics and Gynecology

## 2018-01-11 ENCOUNTER — Other Ambulatory Visit: Payer: Self-pay

## 2018-01-11 ENCOUNTER — Encounter (HOSPITAL_COMMUNITY): Payer: Self-pay

## 2018-01-11 DIAGNOSIS — J45909 Unspecified asthma, uncomplicated: Secondary | ICD-10-CM | POA: Insufficient documentation

## 2018-01-11 DIAGNOSIS — Z3A32 32 weeks gestation of pregnancy: Secondary | ICD-10-CM

## 2018-01-11 DIAGNOSIS — Z3A31 31 weeks gestation of pregnancy: Secondary | ICD-10-CM

## 2018-01-11 DIAGNOSIS — O0972 Supervision of high risk pregnancy due to social problems, second trimester: Secondary | ICD-10-CM

## 2018-01-11 DIAGNOSIS — O99513 Diseases of the respiratory system complicating pregnancy, third trimester: Secondary | ICD-10-CM | POA: Insufficient documentation

## 2018-01-11 DIAGNOSIS — O09893 Supervision of other high risk pregnancies, third trimester: Secondary | ICD-10-CM

## 2018-01-11 DIAGNOSIS — O479 False labor, unspecified: Secondary | ICD-10-CM

## 2018-01-11 DIAGNOSIS — Z8632 Personal history of gestational diabetes: Secondary | ICD-10-CM | POA: Diagnosis not present

## 2018-01-11 DIAGNOSIS — O09219 Supervision of pregnancy with history of pre-term labor, unspecified trimester: Secondary | ICD-10-CM

## 2018-01-11 DIAGNOSIS — O4703 False labor before 37 completed weeks of gestation, third trimester: Secondary | ICD-10-CM | POA: Diagnosis not present

## 2018-01-11 DIAGNOSIS — O09213 Supervision of pregnancy with history of pre-term labor, third trimester: Secondary | ICD-10-CM | POA: Insufficient documentation

## 2018-01-11 DIAGNOSIS — O99343 Other mental disorders complicating pregnancy, third trimester: Secondary | ICD-10-CM | POA: Insufficient documentation

## 2018-01-11 DIAGNOSIS — O09293 Supervision of pregnancy with other poor reproductive or obstetric history, third trimester: Secondary | ICD-10-CM

## 2018-01-11 DIAGNOSIS — O4700 False labor before 37 completed weeks of gestation, unspecified trimester: Secondary | ICD-10-CM

## 2018-01-11 DIAGNOSIS — O09899 Supervision of other high risk pregnancies, unspecified trimester: Secondary | ICD-10-CM

## 2018-01-11 DIAGNOSIS — N859 Noninflammatory disorder of uterus, unspecified: Secondary | ICD-10-CM

## 2018-01-11 DIAGNOSIS — N858 Other specified noninflammatory disorders of uterus: Secondary | ICD-10-CM

## 2018-01-11 DIAGNOSIS — O09299 Supervision of pregnancy with other poor reproductive or obstetric history, unspecified trimester: Secondary | ICD-10-CM

## 2018-01-11 DIAGNOSIS — O99213 Obesity complicating pregnancy, third trimester: Secondary | ICD-10-CM

## 2018-01-11 LAB — URINALYSIS, ROUTINE W REFLEX MICROSCOPIC
Bilirubin Urine: NEGATIVE
Glucose, UA: NEGATIVE mg/dL
Hgb urine dipstick: NEGATIVE
Ketones, ur: NEGATIVE mg/dL
Nitrite: NEGATIVE
Protein, ur: 30 mg/dL — AB
Specific Gravity, Urine: 1.021 (ref 1.005–1.030)
pH: 6 (ref 5.0–8.0)

## 2018-01-11 LAB — FETAL FIBRONECTIN: Fetal Fibronectin: NEGATIVE

## 2018-01-11 MED ORDER — ACETAMINOPHEN 500 MG PO TABS
1000.0000 mg | ORAL_TABLET | Freq: Once | ORAL | Status: AC
  Administered 2018-01-11: 1000 mg via ORAL
  Filled 2018-01-11: qty 2

## 2018-01-11 MED ORDER — CYCLOBENZAPRINE HCL 10 MG PO TABS
10.0000 mg | ORAL_TABLET | Freq: Once | ORAL | Status: AC
  Administered 2018-01-11: 10 mg via ORAL
  Filled 2018-01-11: qty 1

## 2018-01-11 NOTE — Progress Notes (Addendum)
G3P2 @ 31.[redacted] wksga. Here dt ctx since early this morning. Denies LOF or bleeding. +FM  EFM applied by charge nurse   Intern student at bs assessing pt.   Provider at [facility] assessing. FFN and SVE done.   FFN sent per order.  FFN negative. Provider made aware and reviewed tracing.   D/c instructions given with pt understanding. Pt left unit via ambulatory with SO.

## 2018-01-11 NOTE — Discharge Instructions (Signed)
Braxton Hicks Contractions °Contractions of the uterus can occur throughout pregnancy, but they are not always a sign that you are in labor. You may have practice contractions called Braxton Hicks contractions. These false labor contractions are sometimes confused with true labor. °What are Braxton Hicks contractions? °Braxton Hicks contractions are tightening movements that occur in the muscles of the uterus before labor. Unlike true labor contractions, these contractions do not result in opening (dilation) and thinning of the cervix. Toward the end of pregnancy (32-34 weeks), Braxton Hicks contractions can happen more often and may become stronger. These contractions are sometimes difficult to tell apart from true labor because they can be very uncomfortable. You should not feel embarrassed if you go to the hospital with false labor. °Sometimes, the only way to tell if you are in true labor is for your health care provider to look for changes in the cervix. The health care provider will do a physical exam and may monitor your contractions. If you are not in true labor, the exam should show that your cervix is not dilating and your water has not broken. °If there are other health problems associated with your pregnancy, it is completely safe for you to be sent home with false labor. You may continue to have Braxton Hicks contractions until you go into true labor. °How to tell the difference between true labor and false labor °True labor °· Contractions last 30-70 seconds. °· Contractions become very regular. °· Discomfort is usually felt in the top of the uterus, and it spreads to the lower abdomen and low back. °· Contractions do not go away with walking. °· Contractions usually become more intense and increase in frequency. °· The cervix dilates and gets thinner. °False labor °· Contractions are usually shorter and not as strong as true labor contractions. °· Contractions are usually irregular. °· Contractions  are often felt in the front of the lower abdomen and in the groin. °· Contractions may go away when you walk around or change positions while lying down. °· Contractions get weaker and are shorter-lasting as time goes on. °· The cervix usually does not dilate or become thin. °Follow these instructions at home: °· Take over-the-counter and prescription medicines only as told by your health care provider. °· Keep up with your usual exercises and follow other instructions from your health care provider. °· Eat and drink lightly if you think you are going into labor. °· If Braxton Hicks contractions are making you uncomfortable: °? Change your position from lying down or resting to walking, or change from walking to resting. °? Sit and rest in a tub of warm water. °? Drink enough fluid to keep your urine pale yellow. Dehydration may cause these contractions. °? Do slow and deep breathing several times an hour. °· Keep all follow-up prenatal visits as told by your health care provider. This is important. °Contact a health care provider if: °· You have a fever. °· You have continuous pain in your abdomen. °Get help right away if: °· Your contractions become stronger, more regular, and closer together. °· You have fluid leaking or gushing from your vagina. °· You pass blood-tinged mucus (bloody show). °· You have bleeding from your vagina. °· You have low back pain that you never had before. °· You feel your baby’s head pushing down and causing pelvic pressure. °· Your baby is not moving inside you as much as it used to. °Summary °· Contractions that occur before labor are called Braxton   Hicks contractions, false labor, or practice contractions. °· Braxton Hicks contractions are usually shorter, weaker, farther apart, and less regular than true labor contractions. True labor contractions usually become progressively stronger and regular and they become more frequent. °· Manage discomfort from Braxton Hicks contractions by  changing position, resting in a warm bath, drinking plenty of water, or practicing deep breathing. °This information is not intended to replace advice given to you by your health care provider. Make sure you discuss any questions you have with your health care provider. °Document Released: 04/16/2017 Document Revised: 04/16/2017 Document Reviewed: 04/16/2017 °Elsevier Interactive Patient Education © 2018 [employer] ° °

## 2018-01-11 NOTE — MAU Note (Signed)
Been having contractions since early hours. Coming and going. No bleeding or leaking. Just had [place], everything was fine.

## 2018-01-11 NOTE — MAU Provider Note (Signed)
History  CSN: [id number] Arrival date and time: 01/11/18 1401  First Provider Initiated Contact with Patient 01/11/18 1521      Chief Complaint  Patient presents with  . Contractions    HPI: [name] is a 32 y.o. [id number] with IUP at [redacted]w[redacted]d who presents to maternity admissions reporting contractions.    The patient first noted contractions starting some time early this morning.  She did not recall how frequently the contractions were occurring.  She reports decreased fetal movement today which started 3 days ago.  She also reports having a headache that started 5 days ago and has not been resolved with medication.  She reports diarrhea for the past 2 days.  She denies leakage of fluid or vaginal bleeding.  She was seen in the MAU on 01/06/18 with a similar complaint of contractions; however, the patient reports that contractions today are more intense than those at her last visit.    Patient had scheduled [place] at [facility] this morning prior to coming to MAU.   She receives [facility].   Pregnancy complicated by:  - Supervision of high risk pregnancy due to social problems - Hx of preeclampsia, prior pregnancy - History of preterm delivery - History of gestational diabetes in prior pregnancy - Short interval between pregnancies affecting pregnancy.   OB History  Gravida Para Term Preterm AB Living  3 2 1 1  0 2  SAB TAB Ectopic Multiple Live Births  0     0 2    # Outcome Date GA Lbr Len/2nd Weight Sex Delivery Anes PTL Lv  3 Current           2 Preterm 02/03/17 [redacted]w[redacted]d  3.4 kg (7 lb 7.9 oz) M Vag-Spont EPI  LIV  1 Term 02/11/16 [redacted]w[redacted]d / 01:14 3.702 kg (8 lb 2.6 oz) F Vag-Spont EPI  LIV     Past Medical History:  Diagnosis Date  . Anxiety   . Asthma    inhaler PRN-hasn't used inhaler in months  . Attention deficit hyperactivity disorder   . Bipolar disorder [facility])    hospitalized as a teen for suicidal ideations and cutting  . Depression   . Gastritis   . GERD  (gastroesophageal reflux disease)   . HPV (human papilloma virus) anogenital infection   . Hx of varicella   . Mild preeclampsia 02/09/2016  . Vaginal Pap smear, abnormal    Past Surgical History:  Procedure Laterality Date  . EYE MUSCLE SURGERY Bilateral    07/29/12  . MOUTH SURGERY    . PLANTAR FASCIA SURGERY    . WISDOM TOOTH EXTRACTION     Family History  Problem Relation Age of Onset  . Depression Mother   . Colon cancer Mother   . Colon polyps Mother   . Depression Father   . Depression Brother   . Liver disease Unknown   . Kidney disease Unknown    Social History   Socioeconomic History  . Marital status: Married    Spouse name: Not on file  . Number of children: 0  . Years of education: Not on file  . Highest education level: Not on file  Social Needs  . Financial resource strain: Not on file  . Food insecurity - worry: Not on file  . Food insecurity - inability: Not on file  . Transportation needs - medical: Not on file  . Transportation needs - non-medical: Not on file  Occupational History  . Not on  file  Tobacco Use  . Smoking status: Never Smoker  . Smokeless tobacco: Never Used  Substance and Sexual Activity  . Alcohol use: No    Alcohol/week: 0.0 oz  . Drug use: No  . Sexual activity: Yes    Birth control/protection: None  Other Topics Concern  . Not on file  Social History Narrative  . Not on file   Allergies  Allergen Reactions  . Depakote [Divalproex Sodium] Other (See Comments)    Pt states that this medication makes her blood levels toxic.    [name] Methylphenidate Derivatives Other (See Comments)    Reaction:  Depression and anger   . Neurontin [Gabapentin] Other (See Comments)    Reaction:  Dizziness   . Prozac [Fluoxetine Hcl] Other (See Comments)    Reaction:  Anger     Medications Prior to Admission  Medication Sig Dispense Refill Last Dose  . aspirin EC 81 MG tablet Take 1 tablet (81 mg total) by mouth daily. Take after 12 weeks  for prevention of preeclampsia later in pregnancy (Patient not taking: Reported on 12/21/2017) 300 tablet 2 Not Taking at Unknown time  . chlorproMAZINE (THORAZINE) 25 MG tablet Take 25 mg 3 (three) times daily by mouth.    01/05/2018 at Unknown time  . metoCLOPramide (REGLAN) 10 MG tablet Take 1 tablet (10 mg total) by mouth every 6 (six) hours. 30 tablet 0 Past Week at Unknown time  . Prenatal Vit-Fe Fumarate-FA (PRENATAL MULTIVITAMIN) TABS tablet Take 1 tablet at bedtime by mouth.    01/05/2018 at Unknown time    I have reviewed patient's Past Medical Hx, Surgical Hx, Family Hx, Social Hx, medications and allergies.   Review of Systems: Negative except for what is mentioned in HPI.  Physical Exam   Blood pressure (!) 106/59, pulse 72, temperature 98.2 F (36.8 C), temperature source Oral, resp. rate 18, weight 89.2 kg (196 lb 12 oz), SpO2 99 %, unknown if currently breastfeeding.  Constitutional: Well-developed, well-nourished female in no acute distress.  HENT: [place]/AT.  MMM. Eyes: Normal conjunctivae, no scleral icterus Cardiovascular: RRR, no appreciated murmurs, rubs, or gallops Respiratory: Lungs CTAB.  Normal WOB GI: Abd non-tender, gravid appropriate for gestational age.   Pelvic: NEFG.  Cervix at 0.5/thick/high MSK: Extremities nontender, no edema Neurologic: Alert and oriented x 4. Psych: Annoyed and disengaged mood and congruent affect Skin: Warm and dry   FHT:  Baseline 130 bpm, moderate variability, accelerations present, no decelerations Toco: irregular contractions  MAU Course/MDM:   Nursing notes and VS reviewed. Patient seen and examined, as noted above.   Patient is hemodynamically stable. Labs ordered: Urinalysis, Fetal fibronectin Cervical check at 1545: 05/thick  Results reviewed:  Results for orders placed or performed during the hospital encounter of 01/11/18  Urinalysis, Routine w reflex microscopic  Result Value Ref Range   Color, Urine AMBER (A) YELLOW    APPearance HAZY (A) CLEAR   Specific Gravity, Urine 1.021 1.005 - 1.030   pH 6.0 5.0 - 8.0   Glucose, UA NEGATIVE NEGATIVE mg/dL   Hgb urine dipstick NEGATIVE NEGATIVE   Bilirubin Urine NEGATIVE NEGATIVE   Ketones, ur NEGATIVE NEGATIVE mg/dL   Protein, ur 30 (A) NEGATIVE mg/dL   Nitrite NEGATIVE NEGATIVE   Leukocytes, UA SMALL (A) NEGATIVE   RBC / HPF 0-5 0 - 5 RBC/hpf   WBC, UA 6-30 0 - 5 WBC/hpf   Bacteria, UA FEW (A) NONE SEEN   Squamous Epithelial / LPF 6-30 (A) NONE SEEN  Mucus PRESENT   Fetal fibronectin  Result Value Ref Range   Fetal Fibronectin NEGATIVE NEGATIVE      Assessment and Plan  Assessment: 1. Preterm uterine contractions   2. Uterine irritability     [name] is a [id number] with IUP at [redacted]w[redacted]d who presents with a primary complaint of contractions.  Most likely cause for patient's presentation is uterine irritability.  The patient has low fluid intake and inconsistent contractions, both consistent with a diagnosis of uterine irritability.  Preterm labor is also on the differential but is less likely due to negative fetal fibronectin, fingertip cervix and lack of consistent contractions.  Plan: --Encourage increased fluid intake to decrease uterine irritability. --Discharge home in stable condition.  --Labor precautions discussed with patient.     Devonne Doughty, Medical Student 01/11/2018 4:58 PM   OB FELLOW MEDICAL STUDENT NOTE ATTESTATION I confirm that I have verified the information documented in the medical student's note and that I have also personally performed the physical exam and all medical decision making activities.  32 y.o. [id number] with IUP at [redacted]w[redacted]d presenting with preterm contractions. Intermittent uterine irritability here. Cervix FT at internal os and long. FFN negative. Reactive NST --Counseled extensively on increased water intake and importance of hydration --Discussed PTL precautions and other return precations --D/c home  in stable condition   Gailen Shelter, MD OB Fellow 01/11/2018, 5:27 PM

## 2018-01-14 ENCOUNTER — Encounter (HOSPITAL_COMMUNITY): Payer: Self-pay | Admitting: *Deleted

## 2018-01-14 ENCOUNTER — Inpatient Hospital Stay (HOSPITAL_COMMUNITY): Payer: Medicare Other

## 2018-01-14 ENCOUNTER — Inpatient Hospital Stay (HOSPITAL_COMMUNITY)
Admission: AD | Admit: 2018-01-14 | Discharge: 2018-01-14 | Disposition: A | Discharge status: Home / Self Care | Payer: Medicare Other | Source: Ambulatory Visit | Attending: Obstetrics & Gynecology | Admitting: Obstetrics & Gynecology

## 2018-01-14 DIAGNOSIS — F319 Bipolar disorder, unspecified: Secondary | ICD-10-CM | POA: Insufficient documentation

## 2018-01-14 DIAGNOSIS — Z8371 Family history of colonic polyps: Secondary | ICD-10-CM | POA: Diagnosis not present

## 2018-01-14 DIAGNOSIS — O479 False labor, unspecified: Secondary | ICD-10-CM

## 2018-01-14 DIAGNOSIS — O368130 Decreased fetal movements, third trimester, not applicable or unspecified: Secondary | ICD-10-CM | POA: Insufficient documentation

## 2018-01-14 DIAGNOSIS — R109 Unspecified abdominal pain: Secondary | ICD-10-CM | POA: Diagnosis not present

## 2018-01-14 DIAGNOSIS — Z818 Family history of other mental and behavioral disorders: Secondary | ICD-10-CM | POA: Insufficient documentation

## 2018-01-14 DIAGNOSIS — O99213 Obesity complicating pregnancy, third trimester: Secondary | ICD-10-CM | POA: Diagnosis not present

## 2018-01-14 DIAGNOSIS — Z3A32 32 weeks gestation of pregnancy: Secondary | ICD-10-CM | POA: Diagnosis not present

## 2018-01-14 DIAGNOSIS — K219 Gastro-esophageal reflux disease without esophagitis: Secondary | ICD-10-CM | POA: Diagnosis not present

## 2018-01-14 DIAGNOSIS — O09293 Supervision of pregnancy with other poor reproductive or obstetric history, third trimester: Secondary | ICD-10-CM | POA: Insufficient documentation

## 2018-01-14 DIAGNOSIS — O99513 Diseases of the respiratory system complicating pregnancy, third trimester: Secondary | ICD-10-CM | POA: Insufficient documentation

## 2018-01-14 DIAGNOSIS — Z7982 Long term (current) use of aspirin: Secondary | ICD-10-CM | POA: Insufficient documentation

## 2018-01-14 DIAGNOSIS — J45909 Unspecified asthma, uncomplicated: Secondary | ICD-10-CM | POA: Diagnosis not present

## 2018-01-14 DIAGNOSIS — Z9889 Other specified postprocedural states: Secondary | ICD-10-CM | POA: Insufficient documentation

## 2018-01-14 DIAGNOSIS — O99613 Diseases of the digestive system complicating pregnancy, third trimester: Secondary | ICD-10-CM | POA: Insufficient documentation

## 2018-01-14 DIAGNOSIS — O09893 Supervision of other high risk pregnancies, third trimester: Secondary | ICD-10-CM | POA: Insufficient documentation

## 2018-01-14 DIAGNOSIS — Z888 Allergy status to other drugs, medicaments and biological substances status: Secondary | ICD-10-CM | POA: Diagnosis not present

## 2018-01-14 DIAGNOSIS — O4700 False labor before 37 completed weeks of gestation, unspecified trimester: Secondary | ICD-10-CM

## 2018-01-14 DIAGNOSIS — Z841 Family history of disorders of kidney and ureter: Secondary | ICD-10-CM | POA: Diagnosis not present

## 2018-01-14 DIAGNOSIS — Z79899 Other long term (current) drug therapy: Secondary | ICD-10-CM | POA: Diagnosis not present

## 2018-01-14 DIAGNOSIS — F419 Anxiety disorder, unspecified: Secondary | ICD-10-CM | POA: Insufficient documentation

## 2018-01-14 DIAGNOSIS — O26893 Other specified pregnancy related conditions, third trimester: Secondary | ICD-10-CM | POA: Insufficient documentation

## 2018-01-14 DIAGNOSIS — O99343 Other mental disorders complicating pregnancy, third trimester: Secondary | ICD-10-CM | POA: Diagnosis not present

## 2018-01-14 LAB — URINALYSIS, ROUTINE W REFLEX MICROSCOPIC
Glucose, UA: NEGATIVE mg/dL
Hgb urine dipstick: NEGATIVE
Ketones, ur: 5 mg/dL — AB
Nitrite: NEGATIVE
Protein, ur: 100 mg/dL — AB
Specific Gravity, Urine: 1.027 (ref 1.005–1.030)
pH: 5 (ref 5.0–8.0)

## 2018-01-14 MED ORDER — LACTATED RINGERS IV BOLUS (SEPSIS): 1000.0000 mL | Freq: Once | INTRAVENOUS | Status: DC

## 2018-01-14 MED ORDER — NIFEDIPINE 10 MG PO CAPS: 10.0000 mg | ORAL_CAPSULE | ORAL | Status: DC | PRN

## 2018-01-14 NOTE — MAU Note (Signed)
Pt reports concerns about DFM. Pt states he normally moves more than he has today. Pt states she just felt him move but he's not as active.

## 2018-01-14 NOTE — MAU Note (Signed)
Provider ordered IV fluid bolus for pts ctx. Pt refused the IV because she states she is getting stuck at her appt tomorrow 3x." Provider then ordered procardia for pts ctxs pt asked about the medication and was informed that it can help with the ctxs and that we monitor her BP's. Pt asked what will happen to her baby if it is born now pt informed about risk of preterm delivery. Pt then refused the medication. Provider notified.

## 2018-01-14 NOTE — MAU Note (Signed)
Urine is in the Lab 

## 2018-01-14 NOTE — MAU Provider Note (Signed)
History     CSN: [id number]  Arrival date and time: 01/14/18 3557   First Provider Initiated Contact with Patient 01/14/18 1940      Chief Complaint  Patient presents with  . Contractions  . Decreased Fetal Movement   [name] is a 32 y.o. [id number] at [redacted]w[redacted]d who presents with abdominal pain and decreased fetal movement. She states the pain has been ongoing since her last visit in MAU. She rates the pain a 7/10 and has not tried anything for the pain. She denies leaking or bleeding. She also reports a decrease in fetal movement but reports feeling movement while in MAU.  Abdominal Pain  This is a recurrent problem. The current episode started in the past 7 days. The onset quality is gradual. The problem occurs intermittently. The problem has been unchanged. The quality of the pain is cramping. The abdominal pain does not radiate. Pertinent negatives include no constipation, diarrhea, dysuria, fever, headaches, nausea or vomiting. Nothing aggravates the pain. The pain is relieved by nothing. She has tried nothing for the symptoms.   RN Note: Pt reports concerns about DFM. Pt states he normally moves more than he has today. Pt states she just felt him move but he's not as active    OB History    Gravida Para Term Preterm AB Living   3 2 1 1  0 2   SAB TAB Ectopic Multiple Live Births   0     0 2      Past Medical History:  Diagnosis Date  . Anxiety   . Asthma    inhaler PRN-hasn't used inhaler in months  . Attention deficit hyperactivity disorder   . Bipolar disorder [facility])    hospitalized as a teen for suicidal ideations and cutting  . Depression   . Gastritis   . GERD (gastroesophageal reflux disease)   . HPV (human papilloma virus) anogenital infection   . Hx of varicella   . Mild preeclampsia 02/09/2016  . Vaginal Pap smear, abnormal     Past Surgical History:  Procedure Laterality Date  . EYE MUSCLE SURGERY Bilateral    07/29/12  . MOUTH SURGERY    .  PLANTAR FASCIA SURGERY    . WISDOM TOOTH EXTRACTION      Family History  Problem Relation Age of Onset  . Depression Mother   . Colon cancer Mother   . Colon polyps Mother   . Depression Father   . Depression Brother   . Liver disease Unknown   . Kidney disease Unknown     Social History   Tobacco Use  . Smoking status: Never Smoker  . Smokeless tobacco: Never Used  Substance Use Topics  . Alcohol use: No    Alcohol/week: 0.0 oz  . Drug use: No    Allergies:  Allergies  Allergen Reactions  . Depakote [Divalproex Sodium] Other (See Comments)    Pt states that this medication makes her blood levels toxic.    [name] Methylphenidate Derivatives Other (See Comments)    Reaction:  Depression and anger   . Neurontin [Gabapentin] Other (See Comments)    Reaction:  Dizziness   . Prozac [Fluoxetine Hcl] Other (See Comments)    Reaction:  Anger     Medications Prior to Admission  Medication Sig Dispense Refill Last Dose  . Prenatal Vit-Fe Fumarate-FA (PRENATAL MULTIVITAMIN) TABS tablet Take 1 tablet at bedtime by mouth.    01/13/2018 at Unknown time  . aspirin EC 81 MG  tablet Take 1 tablet (81 mg total) by mouth daily. Take after 12 weeks for prevention of preeclampsia later in pregnancy (Patient not taking: Reported on 12/21/2017) 300 tablet 2 Not Taking at Unknown time  . chlorproMAZINE (THORAZINE) 25 MG tablet Take 25 mg 3 (three) times daily by mouth.    01/05/2018 at Unknown time  . metoCLOPramide (REGLAN) 10 MG tablet Take 1 tablet (10 mg total) by mouth every 6 (six) hours. 30 tablet 0 Past Week at Unknown time    Review of Systems  Constitutional: Negative.  Negative for fatigue and fever.  HENT: Negative.   Respiratory: Negative.  Negative for shortness of breath.   Cardiovascular: Negative.  Negative for chest pain.  Gastrointestinal: Positive for abdominal pain. Negative for constipation, diarrhea, nausea and vomiting.  Genitourinary: Negative.  Negative for dysuria,  vaginal bleeding and vaginal discharge.  Neurological: Negative.  Negative for dizziness and headaches.   Physical Exam   Blood pressure 133/87, pulse 85, temperature 98.8 F (37.1 C), temperature source Oral, resp. rate 16, height 5\' 3"  (1.6 m), weight 198 lb (89.8 kg), SpO2 100 %, unknown if currently breastfeeding.  Physical Exam  Nursing note and vitals reviewed. Constitutional: She is oriented to person, place, and time. She appears well-developed and well-nourished. No distress.  HENT:  Head: Normocephalic.  Eyes: Pupils are equal, round, and reactive to light.  Cardiovascular: Normal rate, regular rhythm and normal heart sounds.  Respiratory: Effort normal and breath sounds normal. No respiratory distress.  GI: Soft. Bowel sounds are normal. She exhibits no distension. There is no tenderness.  Neurological: She is alert and oriented to person, place, and time.  Skin: Skin is warm and dry.  Psychiatric: She has a normal mood and affect. Her behavior is normal. Judgment and thought content normal.   Fetal Tracing:  Baseline: 120 Variability: moderate Accels: 15x15 Decels: none  Toco: uterine irritability with occasional contractions  Dilation: Fingertip Effacement (%): Thick Exam by:: caroline neil cnm   MAU Course  Procedures Results for orders placed or performed during the hospital encounter of 01/14/18 (from the past 24 hour(s))  Urinalysis, Routine w reflex microscopic     Status: Abnormal   Collection Time: 01/14/18  7:01 PM  Result Value Ref Range   Color, Urine AMBER (A) YELLOW   APPearance TURBID (A) CLEAR   Specific Gravity, Urine 1.027 1.005 - 1.030   pH 5.0 5.0 - 8.0   Glucose, UA NEGATIVE NEGATIVE mg/dL   Hgb urine dipstick NEGATIVE NEGATIVE   Bilirubin Urine SMALL (A) NEGATIVE   Ketones, ur 5 (A) NEGATIVE mg/dL   Protein, ur 100 (A) NEGATIVE mg/dL   Nitrite NEGATIVE NEGATIVE   Leukocytes, UA MODERATE (A) NEGATIVE   RBC / HPF 0-5 0 - 5 RBC/hpf    WBC, UA 6-30 0 - 5 WBC/hpf   Bacteria, UA MANY (A) NONE SEEN   Squamous Epithelial / LPF 6-30 (A) NONE SEEN   Mucus PRESENT    MDM UA PO hydration FFN negative 3 days ago IV bolus- patient refused, requested something else to stop contractions Procardia 10mg  PO x3 doses PRN- patient refused  Patient refused all attempts of tocolysis. Patient demanding to be delivered. Explained to patient that she is preterm and will not be induced. Risks of preterm delivery reviewed with patient. Patient declines anything that may slow or stop labor.   Patient insisting she is not feeling any fetal movement despite reactive NST BPP  Care turned over to [facility] Jimmy[facility]  at 2045. Wende Mott, CNM 01/14/18 8:43 PM  Reoffered tocolysis and patient refused [place] ordered by previous provider, BPP 6/8  Cervix rechecked and is unchanged 5 min later she requested another recheck, reassured it is probably unchanged Again offered tocolysis since she is worried about labor, but she declined D/W Dr Ihor Dow, may discharge home  Assessment and Plan  [place] IUP at [redacted]w[redacted]d Preterm uterine irritability No cervical change and FFn Negative Decreased perception of fetal movement, BPP 8/10  Discharge home Encouraged to return here or to other Urgent Care/ED if she develops worsening of symptoms, increase in pain, fever, or other concerning symptoms.  PO hydration Followup in office as scheduled

## 2018-01-14 NOTE — Discharge Instructions (Signed)
Braxton Hicks Contractions °Contractions of the uterus can occur throughout pregnancy, but they are not always a sign that you are in labor. You may have practice contractions called Braxton Hicks contractions. These false labor contractions are sometimes confused with true labor. °What are Braxton Hicks contractions? °Braxton Hicks contractions are tightening movements that occur in the muscles of the uterus before labor. Unlike true labor contractions, these contractions do not result in opening (dilation) and thinning of the cervix. Toward the end of pregnancy (32-34 weeks), Braxton Hicks contractions can happen more often and may become stronger. These contractions are sometimes difficult to tell apart from true labor because they can be very uncomfortable. You should not feel embarrassed if you go to the hospital with false labor. °Sometimes, the only way to tell if you are in true labor is for your health care provider to look for changes in the cervix. The health care provider will do a physical exam and may monitor your contractions. If you are not in true labor, the exam should show that your cervix is not dilating and your water has not broken. °If there are other health problems associated with your pregnancy, it is completely safe for you to be sent home with false labor. You may continue to have Braxton Hicks contractions until you go into true labor. °How to tell the difference between true labor and false labor °True labor °· Contractions last 30-70 seconds. °· Contractions become very regular. °· Discomfort is usually felt in the top of the uterus, and it spreads to the lower abdomen and low back. °· Contractions do not go away with walking. °· Contractions usually become more intense and increase in frequency. °· The cervix dilates and gets thinner. °False labor °· Contractions are usually shorter and not as strong as true labor contractions. °· Contractions are usually irregular. °· Contractions  are often felt in the front of the lower abdomen and in the groin. °· Contractions may go away when you walk around or change positions while lying down. °· Contractions get weaker and are shorter-lasting as time goes on. °· The cervix usually does not dilate or become thin. °Follow these instructions at home: °· Take over-the-counter and prescription medicines only as told by your health care provider. °· Keep up with your usual exercises and follow other instructions from your health care provider. °· Eat and drink lightly if you think you are going into labor. °· If Braxton Hicks contractions are making you uncomfortable: °? Change your position from lying down or resting to walking, or change from walking to resting. °? Sit and rest in a tub of warm water. °? Drink enough fluid to keep your urine pale yellow. Dehydration may cause these contractions. °? Do slow and deep breathing several times an hour. °· Keep all follow-up prenatal visits as told by your health care provider. This is important. °Contact a health care provider if: °· You have a fever. °· You have continuous pain in your abdomen. °Get help right away if: °· Your contractions become stronger, more regular, and closer together. °· You have fluid leaking or gushing from your vagina. °· You pass blood-tinged mucus (bloody show). °· You have bleeding from your vagina. °· You have low back pain that you never had before. °· You feel your baby’s head pushing down and causing pelvic pressure. °· Your baby is not moving inside you as much as it used to. °Summary °· Contractions that occur before labor are called Braxton   Hicks contractions, false labor, or practice contractions.  Braxton Hicks contractions are usually shorter, weaker, farther apart, and less regular than true labor contractions. True labor contractions usually become progressively stronger and regular and they become more frequent.  Manage discomfort from [facility] contractions by  changing position, resting in a warm bath, drinking plenty of water, or practicing deep breathing. This information is not intended to replace advice given to you by your health care provider. Make sure you discuss any questions you have with your health care provider. Document Released: 04/16/2017 Document Revised: 04/16/2017 Document Reviewed: 04/16/2017 Elsevier Interactive Patient Education  2018 [place].   Pelvic Rest Pelvic rest may be recommended if:  Your placenta is partially or completely covering the opening of your cervix (placenta previa).  There is bleeding between the wall of the uterus and the amniotic sac in the first trimester of pregnancy (subchorionic hemorrhage).  You went into labor too early (preterm labor).  Based on your overall health and the health of your baby, your health care provider will decide if pelvic rest is right for you. How do I rest my pelvis? For as long as told by your health care provider:  Do not have sex, sexual stimulation, or an orgasm.  Do not use tampons. Do not douche. Do not put anything in your vagina.  Do not lift anything that is heavier than 10 lb (4.5 kg).  Avoid activities that take a lot of effort (are strenuous).  Avoid any activity in which your pelvic muscles could become strained.  When should I seek medical care? Seek medical care if you have:  Cramping pain in your lower abdomen.  Vaginal discharge.  A low, dull backache.  Regular contractions.  Uterine tightening.  When should I seek immediate medical care? Seek immediate medical care if:  You have vaginal bleeding and you are pregnant.  This information is not intended to replace advice given to you by your health care provider. Make sure you discuss any questions you have with your health care provider. Document Released: 03/28/2011 Document Revised: 05/08/2016 Document Reviewed: 06/04/2015 Elsevier Interactive Patient Education  2018 [employer].   Fetal Movement Counts Patient Name: ________________________________________________ Patient Due Date: ____________________ What is a fetal movement count? A fetal movement count is the number of times that you feel your baby move during a certain amount of time. This may also be called a fetal kick count. A fetal movement count is recommended for every pregnant woman. You may be asked to start counting fetal movements as early as week 28 of your pregnancy. Pay attention to when your baby is most active. You may notice your baby's sleep and wake cycles. You may also notice things that make your baby move more. You should do a fetal movement count:  When your baby is normally most active.  At the same time each day.  A good time to count movements is while you are resting, after having something to eat and drink. How do I count fetal movements? 1. Find a quiet, comfortable area. Sit, or lie down on your side. 2. Write down the date, the start time and stop time, and the number of movements that you felt between those two times. Take this information with you to your health care visits. 3. For 2 hours, count kicks, flutters, swishes, rolls, and jabs. You should feel at least 10 movements during 2 hours. 4. You may stop counting after you have felt 10 movements. 5.  If you do not feel 10 movements in 2 hours, have something to eat and drink. Then, keep resting and counting for 1 hour. If you feel at least 4 movements during that hour, you may stop counting. Contact a health care provider if:  You feel fewer than 4 movements in 2 hours.  Your baby is not moving like he or she usually does. Date: ____________ Start time: ____________ Stop time: ____________ Movements: ____________ Date: ____________ Start time: ____________ Stop time: ____________ Movements: ____________ Date: ____________ Start time: ____________ Stop time: ____________ Movements: ____________ Date: ____________ Start  time: ____________ Stop time: ____________ Movements: ____________ Date: ____________ Start time: ____________ Stop time: ____________ Movements: ____________ Date: ____________ Start time: ____________ Stop time: ____________ Movements: ____________ Date: ____________ Start time: ____________ Stop time: ____________ Movements: ____________ Date: ____________ Start time: ____________ Stop time: ____________ Movements: ____________ Date: ____________ Start time: ____________ Stop time: ____________ Movements: ____________ This information is not intended to replace advice given to you by your health care provider. Make sure you discuss any questions you have with your health care provider. Document Released: 12/31/2006 Document Revised: 07/30/2016 Document Reviewed: 01/10/2016 Elsevier Interactive Patient Education  [employer].

## 2018-01-15 ENCOUNTER — Inpatient Hospital Stay (HOSPITAL_COMMUNITY)
Admission: AD | Admit: 2018-01-15 | Discharge: 2018-01-15 | Disposition: A | Discharge status: Home / Self Care | Payer: Medicare Other | Source: Ambulatory Visit | Attending: Obstetrics and Gynecology | Admitting: Obstetrics and Gynecology

## 2018-01-15 ENCOUNTER — Encounter (HOSPITAL_COMMUNITY): Payer: Self-pay | Admitting: Obstetrics and Gynecology

## 2018-01-15 ENCOUNTER — Other Ambulatory Visit: Payer: Medicare Other

## 2018-01-15 ENCOUNTER — Other Ambulatory Visit: Payer: Self-pay

## 2018-01-15 DIAGNOSIS — Z3A32 32 weeks gestation of pregnancy: Secondary | ICD-10-CM | POA: Diagnosis not present

## 2018-01-15 DIAGNOSIS — Z7982 Long term (current) use of aspirin: Secondary | ICD-10-CM | POA: Diagnosis not present

## 2018-01-15 DIAGNOSIS — O4700 False labor before 37 completed weeks of gestation, unspecified trimester: Secondary | ICD-10-CM | POA: Diagnosis present

## 2018-01-15 DIAGNOSIS — O479 False labor, unspecified: Secondary | ICD-10-CM | POA: Diagnosis present

## 2018-01-15 DIAGNOSIS — O0973 Supervision of high risk pregnancy due to social problems, third trimester: Secondary | ICD-10-CM

## 2018-01-15 DIAGNOSIS — O4703 False labor before 37 completed weeks of gestation, third trimester: Secondary | ICD-10-CM

## 2018-01-15 NOTE — Discharge Instructions (Signed)
Braxton Hicks Contractions °Contractions of the uterus can occur throughout pregnancy, but they are not always a sign that you are in labor. You may have practice contractions called Braxton Hicks contractions. These false labor contractions are sometimes confused with true labor. °What are Braxton Hicks contractions? °Braxton Hicks contractions are tightening movements that occur in the muscles of the uterus before labor. Unlike true labor contractions, these contractions do not result in opening (dilation) and thinning of the cervix. Toward the end of pregnancy (32-34 weeks), Braxton Hicks contractions can happen more often and may become stronger. These contractions are sometimes difficult to tell apart from true labor because they can be very uncomfortable. You should not feel embarrassed if you go to the hospital with false labor. °Sometimes, the only way to tell if you are in true labor is for your health care provider to look for changes in the cervix. The health care provider will do a physical exam and may monitor your contractions. If you are not in true labor, the exam should show that your cervix is not dilating and your water has not broken. °If there are other health problems associated with your pregnancy, it is completely safe for you to be sent home with false labor. You may continue to have Braxton Hicks contractions until you go into true labor. °How to tell the difference between true labor and false labor °True labor °· Contractions last 30-70 seconds. °· Contractions become very regular. °· Discomfort is usually felt in the top of the uterus, and it spreads to the lower abdomen and low back. °· Contractions do not go away with walking. °· Contractions usually become more intense and increase in frequency. °· The cervix dilates and gets thinner. °False labor °· Contractions are usually shorter and not as strong as true labor contractions. °· Contractions are usually irregular. °· Contractions  are often felt in the front of the lower abdomen and in the groin. °· Contractions may go away when you walk around or change positions while lying down. °· Contractions get weaker and are shorter-lasting as time goes on. °· The cervix usually does not dilate or become thin. °Follow these instructions at home: °· Take over-the-counter and prescription medicines only as told by your health care provider. °· Keep up with your usual exercises and follow other instructions from your health care provider. °· Eat and drink lightly if you think you are going into labor. °· If Braxton Hicks contractions are making you uncomfortable: °? Change your position from lying down or resting to walking, or change from walking to resting. °? Sit and rest in a tub of warm water. °? Drink enough fluid to keep your urine pale yellow. Dehydration may cause these contractions. °? Do slow and deep breathing several times an hour. °· Keep all follow-up prenatal visits as told by your health care provider. This is important. °Contact a health care provider if: °· You have a fever. °· You have continuous pain in your abdomen. °Get help right away if: °· Your contractions become stronger, more regular, and closer together. °· You have fluid leaking or gushing from your vagina. °· You pass blood-tinged mucus (bloody show). °· You have bleeding from your vagina. °· You have low back pain that you never had before. °· You feel your baby’s head pushing down and causing pelvic pressure. °· Your baby is not moving inside you as much as it used to. °Summary °· Contractions that occur before labor are called Braxton   Hicks contractions, false labor, or practice contractions. °· Braxton Hicks contractions are usually shorter, weaker, farther apart, and less regular than true labor contractions. True labor contractions usually become progressively stronger and regular and they become more frequent. °· Manage discomfort from Braxton Hicks contractions by  changing position, resting in a warm bath, drinking plenty of water, or practicing deep breathing. °This information is not intended to replace advice given to you by your health care provider. Make sure you discuss any questions you have with your health care provider. °Document Released: 04/16/2017 Document Revised: 04/16/2017 Document Reviewed: 04/16/2017 °Elsevier Interactive Patient Education © 2018 [employer] ° °

## 2018-01-15 NOTE — MAU Note (Signed)
Same thing, contractions.  Was here last night. No bleeding or leaking.

## 2018-01-15 NOTE — MAU Note (Signed)
Pt left without signing and discharge paper work.

## 2018-01-15 NOTE — MAU Provider Note (Signed)
History     CSN: [id number]  Arrival date and time: 01/15/18 1621   First Provider Initiated Contact with Patient 01/15/18 1707      Chief Complaint  Patient presents with  . Contractions   HPI  [name] is a 32 y.o. year old [id number] female at [redacted]w[redacted]d weeks gestation who presents to MAU reporting contractions all day. She was seen in MAU last night for the same complaint. She denies any VB or LOF. She reports good (+) FM.   Past Medical History:  Diagnosis Date  . Anxiety   . Asthma    inhaler PRN-hasn't used inhaler in months  . Attention deficit hyperactivity disorder   . Bipolar disorder [facility])    hospitalized as a teen for suicidal ideations and cutting  . Depression   . Gastritis   . GERD (gastroesophageal reflux disease)   . HPV (human papilloma virus) anogenital infection   . Hx of varicella   . Mild preeclampsia 02/09/2016  . Vaginal Pap smear, abnormal     Past Surgical History:  Procedure Laterality Date  . EYE MUSCLE SURGERY Bilateral    07/29/12  . MOUTH SURGERY    . PLANTAR FASCIA SURGERY    . WISDOM TOOTH EXTRACTION      Family History  Problem Relation Age of Onset  . Depression Mother   . Colon cancer Mother   . Colon polyps Mother   . Depression Father   . Depression Brother   . Liver disease Unknown   . Kidney disease Unknown     Social History   Tobacco Use  . Smoking status: Never Smoker  . Smokeless tobacco: Never Used  Substance Use Topics  . Alcohol use: No    Alcohol/week: 0.0 oz  . Drug use: No    Allergies:  Allergies  Allergen Reactions  . Depakote [Divalproex Sodium] Other (See Comments)    Pt states that this medication makes her blood levels toxic.    [name] Methylphenidate Derivatives Other (See Comments)    Reaction:  Depression and anger   . Neurontin [Gabapentin] Other (See Comments)    Reaction:  Dizziness   . Prozac [Fluoxetine Hcl] Other (See Comments)    Reaction:  Anger     Medications Prior to  Admission  Medication Sig Dispense Refill Last Dose  . aspirin EC 81 MG tablet Take 1 tablet (81 mg total) by mouth daily. Take after 12 weeks for prevention of preeclampsia later in pregnancy (Patient not taking: Reported on 12/21/2017) 300 tablet 2 Not Taking at Unknown time  . chlorproMAZINE (THORAZINE) 25 MG tablet Take 25 mg 3 (three) times daily by mouth.    01/05/2018 at Unknown time  . metoCLOPramide (REGLAN) 10 MG tablet Take 1 tablet (10 mg total) by mouth every 6 (six) hours. 30 tablet 0 Past Week at Unknown time  . Prenatal Vit-Fe Fumarate-FA (PRENATAL MULTIVITAMIN) TABS tablet Take 1 tablet at bedtime by mouth.    01/13/2018 at Unknown time    Review of Systems  Constitutional: Negative.   HENT: Negative.   Eyes: Negative.   Respiratory: Negative.   Cardiovascular: Negative.   Gastrointestinal: Negative.   Endocrine: Negative.   Genitourinary: Positive for pelvic pain (contractions).  Musculoskeletal: Negative.   Skin: Negative.   Allergic/Immunologic: Negative.   Neurological: Negative.   Hematological: Negative.   Psychiatric/Behavioral: Negative.    Physical Exam   Blood pressure 114/83, pulse 84, temperature 98.1 F (36.7 C), temperature source Oral,  resp. rate 16, weight 199 lb 12 oz (90.6 kg), SpO2 98 %, unknown if currently breastfeeding.  Physical Exam  Nursing note and vitals reviewed. Constitutional: She is oriented to person, place, and time. She appears well-developed and well-nourished.  HENT:  Head: Normocephalic.  Eyes: Pupils are equal, round, and reactive to light.  Neck: Normal range of motion.  Cardiovascular: Normal rate, regular rhythm and normal heart sounds.  Respiratory: Effort normal and breath sounds normal.  GI: Soft. Bowel sounds are normal.  Genitourinary:  Genitourinary Comments: Dilation: Fingertip Effacement (%): Thick Cervical Position: Middle Station: Ballotable Presentation: Undeterminable Exam by: Sunday Corn, CNM    Musculoskeletal: Normal range of motion.  Neurological: She is alert and oriented to person, place, and time.  Skin: Skin is warm and dry.  Psychiatric: She has a normal mood and affect. Her behavior is normal. Judgment and thought content normal.    MAU Course  Procedures  MDM NST - FHR: 125 bpm / moderate variability / accels present / decels absent / TOCO: none   Assessment and Plan  Preterm contractions - Information provided on PTL - Advised to return for 6 or > strong UC's - discharge home - Keep scheduled appt  Patient verbalized an understanding of the plan of care and agrees.   Laury Deep, MSN, CNM 01/15/2018, 5:46 PM

## 2018-01-16 LAB — CULTURE, OB URINE

## 2018-01-17 ENCOUNTER — Inpatient Hospital Stay (HOSPITAL_COMMUNITY)
Admission: AD | Admit: 2018-01-17 | Discharge: 2018-01-18 | Disposition: A | Discharge status: Home / Self Care | Payer: Medicare Other | Source: Ambulatory Visit | Attending: Obstetrics & Gynecology | Admitting: Obstetrics & Gynecology

## 2018-01-17 ENCOUNTER — Other Ambulatory Visit: Payer: Self-pay

## 2018-01-17 ENCOUNTER — Encounter (HOSPITAL_COMMUNITY): Payer: Self-pay | Admitting: *Deleted

## 2018-01-17 DIAGNOSIS — Z3A32 32 weeks gestation of pregnancy: Secondary | ICD-10-CM | POA: Insufficient documentation

## 2018-01-17 DIAGNOSIS — O4703 False labor before 37 completed weeks of gestation, third trimester: Secondary | ICD-10-CM | POA: Insufficient documentation

## 2018-01-17 DIAGNOSIS — O479 False labor, unspecified: Secondary | ICD-10-CM

## 2018-01-17 LAB — URINALYSIS, ROUTINE W REFLEX MICROSCOPIC
Glucose, UA: NEGATIVE mg/dL
Hgb urine dipstick: NEGATIVE
Ketones, ur: NEGATIVE mg/dL
Nitrite: NEGATIVE
Protein, ur: 30 mg/dL — AB
Specific Gravity, Urine: 1.019 (ref 1.005–1.030)
pH: 5 (ref 5.0–8.0)

## 2018-01-17 NOTE — MAU Provider Note (Signed)
Chief Complaint:  Contractions   First Provider Initiated Contact with Patient 01/17/18 2338      HPI: [name] is a 32 y.o. [id number] at [redacted]w[redacted]d who presents to maternity admissions reporting frequent contractions today. She reports the pain is in her low abdomen and pelvis and is every 5 minutes. She presented on 1/28, 1/31 and 2/1 with similar symptoms. FFN on 1/28 was negative.  There are no other associated symptoms. She has not tried any treatments.  She mentions that her recent ultrasound makes her due date sooner so she should be farther along.  Her current EDD of 03/10/18 is based on 6 week [place]. She reports good fetal movement, denies LOF, vaginal bleeding, vaginal itching/burning, urinary symptoms, h/a, dizziness, n/v, or fever/chills.    HPI  Past Medical History: Past Medical History:  Diagnosis Date  . Anxiety   . Asthma    inhaler PRN-hasn't used inhaler in months  . Attention deficit hyperactivity disorder   . Bipolar disorder [facility])    hospitalized as a teen for suicidal ideations and cutting  . Depression   . Gastritis   . GERD (gastroesophageal reflux disease)   . HPV (human papilloma virus) anogenital infection   . Hx of varicella   . Mild preeclampsia 02/09/2016  . Vaginal Pap smear, abnormal     Past obstetric history: OB History  Gravida Para Term Preterm AB Living  3 2 1 1  0 2  SAB TAB Ectopic Multiple Live Births  0     0 2    # Outcome Date GA Lbr Len/2nd Weight Sex Delivery Anes PTL Lv  3 Current           2 Preterm 02/03/17 [redacted]w[redacted]d  7 lb 7.9 oz (3.4 kg) M Vag-Spont EPI  LIV  1 Term 02/11/16 [redacted]w[redacted]d / 01:14 8 lb 2.6 oz (3.702 kg) F Vag-Spont EPI  LIV      Past Surgical History: Past Surgical History:  Procedure Laterality Date  . EYE MUSCLE SURGERY Bilateral    07/29/12  . MOUTH SURGERY    . PLANTAR FASCIA SURGERY    . WISDOM TOOTH EXTRACTION      Family History: Family History  Problem Relation Age of Onset  . Depression Mother   . Colon  cancer Mother   . Colon polyps Mother   . Depression Father   . Depression Brother   . Liver disease Unknown   . Kidney disease Unknown     Social History: Social History   Tobacco Use  . Smoking status: Never Smoker  . Smokeless tobacco: Never Used  Substance Use Topics  . Alcohol use: No    Alcohol/week: 0.0 oz  . Drug use: No    Allergies:  Allergies  Allergen Reactions  . Depakote [Divalproex Sodium] Other (See Comments)    Pt states that this medication makes her blood levels toxic.    [name] Methylphenidate Derivatives Other (See Comments)    Reaction:  Depression and anger   . Neurontin [Gabapentin] Other (See Comments)    Reaction:  Dizziness   . Prozac [Fluoxetine Hcl] Other (See Comments)    Reaction:  Anger     Meds:  Medications Prior to Admission  Medication Sig Dispense Refill Last Dose  . metoCLOPramide (REGLAN) 10 MG tablet Take 1 tablet (10 mg total) by mouth every 6 (six) hours. 30 tablet 0 Past Month at Unknown time  . Prenatal Vit-Fe Fumarate-FA (PRENATAL MULTIVITAMIN) TABS tablet Take 1 tablet at  bedtime by mouth.    01/16/2018 at Unknown time  . aspirin EC 81 MG tablet Take 1 tablet (81 mg total) by mouth daily. Take after 12 weeks for prevention of preeclampsia later in pregnancy (Patient not taking: Reported on 12/21/2017) 300 tablet 2 Not Taking at Unknown time  . chlorproMAZINE (THORAZINE) 25 MG tablet Take 25 mg 3 (three) times daily by mouth.    01/05/2018 at Unknown time    ROS:  Review of Systems  Constitutional: Negative for chills, fatigue and fever.  Eyes: Negative for visual disturbance.  Respiratory: Negative for shortness of breath.   Cardiovascular: Negative for chest pain.  Gastrointestinal: Positive for abdominal pain. Negative for nausea and vomiting.  Genitourinary: Positive for pelvic pain. Negative for difficulty urinating, dysuria, flank pain, vaginal bleeding, vaginal discharge and vaginal pain.  Musculoskeletal: Positive for back  pain.  Neurological: Negative for dizziness and headaches.  Psychiatric/Behavioral: Negative.      I have reviewed patient's Past Medical Hx, Surgical Hx, Family Hx, Social Hx, medications and allergies.   Physical Exam   Patient Vitals for the past 24 hrs:  BP Temp Pulse Resp SpO2  01/17/18 2255 117/90 98.4 F (36.9 C) (!) 106 16 97 %   Constitutional: Well-developed, well-nourished female in no acute distress.  Cardiovascular: normal rate Respiratory: normal effort GI: Abd soft, non-tender, gravid appropriate for gestational age.  MS: Extremities nontender, no edema, normal ROM Neurologic: Alert and oriented x 4.  GU: Neg CVAT.   Dilation: 1 Effacement (%): Thick Cervical Position: Middle Exam by:: LISA, CNM  FHT:  Baseline 135, moderate variability, accelerations present, no decelerations Contractions: None on toco or to palpation, some mild irritablity   Labs: Results for orders placed or performed during the hospital encounter of 01/17/18 (from the past 24 hour(s))  Urinalysis, Routine w reflex microscopic     Status: Abnormal   Collection Time: 01/17/18 10:35 PM  Result Value Ref Range   Color, Urine AMBER (A) YELLOW   APPearance HAZY (A) CLEAR   Specific Gravity, Urine 1.019 1.005 - 1.030   pH 5.0 5.0 - 8.0   Glucose, UA NEGATIVE NEGATIVE mg/dL   Hgb urine dipstick NEGATIVE NEGATIVE   Bilirubin Urine SMALL (A) NEGATIVE   Ketones, ur NEGATIVE NEGATIVE mg/dL   Protein, ur 30 (A) NEGATIVE mg/dL   Nitrite NEGATIVE NEGATIVE   Leukocytes, UA MODERATE (A) NEGATIVE   RBC / HPF 0-5 0 - 5 RBC/hpf   WBC, UA 6-30 0 - 5 WBC/hpf   Bacteria, UA MANY (A) NONE SEEN   Squamous Epithelial / LPF 6-30 (A) NONE SEEN   Mucus PRESENT    --/--/O POS (02/19 1907)  Imaging:    MAU Course/MDM: UA NST reviewed and reactive Urine sent for culture Pt cervix similar to previous exams but 1 cm/thick today versus FT.  Offered pt discharge after first exam or stay for recheck in 1  hour. Pt prefers to stay for recheck. Cervix unchanged in 1.5 hours, still 1/thick/-3, vertex. D/C home with preterm labor precautions Pt asked for cab voucher but house coverage spoke with pt about need to find a ride home since she has received several cab vouchers during the pregnancy. Pt able to call for [place] Popper ride home with her husband.  Pt discharge with strict preterm labor precautions.   Assessment: 1. Threatened preterm labor, third trimester   2. Braxton Hicks contractions     Plan: Discharge home Labor precautions and fetal kick counts Follow-up Information  [facility] up.   Why:  The office will call you to make your next appointment. Return to MAU as needed for signs of labor or emergencies. Contact information: [place] [id number] [phone]         Allergies as of 01/18/2018      Reactions   Depakote [divalproex Sodium] Other (See Comments)   Pt states that this medication makes her blood levels toxic.     Methylphenidate Derivatives Other (See Comments)   Reaction:  Depression and anger    Neurontin [gabapentin] Other (See Comments)   Reaction:  Dizziness    Prozac [fluoxetine Hcl] Other (See Comments)   Reaction:  Anger       Medication List    TAKE these medications   aspirin EC 81 MG tablet Take 1 tablet (81 mg total) by mouth daily. Take after 12 weeks for prevention of preeclampsia later in pregnancy   chlorproMAZINE 25 MG tablet Commonly known as:  THORAZINE Take 25 mg 3 (three) times daily by mouth.   metoCLOPramide 10 MG tablet Commonly known as:  REGLAN Take 1 tablet (10 mg total) by mouth every 6 (six) hours.   prenatal multivitamin Tabs tablet Take 1 tablet at bedtime by mouth.       Fatima Blank Certified Nurse-Midwife 01/18/2018 1:55 AM

## 2018-01-17 NOTE — MAU Note (Signed)
Pt reports contractions for a couple of hours. Denies recent intercourse. Denies bleeding or LOF. + FM

## 2018-01-18 DIAGNOSIS — O4703 False labor before 37 completed weeks of gestation, third trimester: Secondary | ICD-10-CM | POA: Diagnosis not present

## 2018-01-18 NOTE — Discharge Instructions (Signed)

## 2018-01-19 LAB — CULTURE, OB URINE: Culture: <10000 — AB

## 2018-01-20 ENCOUNTER — Inpatient Hospital Stay (HOSPITAL_COMMUNITY)
Admission: AD | Admit: 2018-01-20 | Discharge: 2018-01-20 | Disposition: A | Discharge status: Home / Self Care | Payer: Medicare Other | Source: Ambulatory Visit | Attending: Obstetrics and Gynecology | Admitting: Obstetrics and Gynecology

## 2018-01-20 ENCOUNTER — Encounter (HOSPITAL_COMMUNITY): Payer: Self-pay

## 2018-01-20 DIAGNOSIS — K219 Gastro-esophageal reflux disease without esophagitis: Secondary | ICD-10-CM | POA: Insufficient documentation

## 2018-01-20 DIAGNOSIS — O26893 Other specified pregnancy related conditions, third trimester: Secondary | ICD-10-CM | POA: Diagnosis not present

## 2018-01-20 DIAGNOSIS — Z7982 Long term (current) use of aspirin: Secondary | ICD-10-CM | POA: Insufficient documentation

## 2018-01-20 DIAGNOSIS — F319 Bipolar disorder, unspecified: Secondary | ICD-10-CM | POA: Diagnosis not present

## 2018-01-20 DIAGNOSIS — O99513 Diseases of the respiratory system complicating pregnancy, third trimester: Secondary | ICD-10-CM | POA: Diagnosis not present

## 2018-01-20 DIAGNOSIS — O4703 False labor before 37 completed weeks of gestation, third trimester: Secondary | ICD-10-CM

## 2018-01-20 DIAGNOSIS — O99613 Diseases of the digestive system complicating pregnancy, third trimester: Secondary | ICD-10-CM | POA: Diagnosis not present

## 2018-01-20 DIAGNOSIS — Z3A33 33 weeks gestation of pregnancy: Secondary | ICD-10-CM | POA: Diagnosis not present

## 2018-01-20 DIAGNOSIS — F419 Anxiety disorder, unspecified: Secondary | ICD-10-CM | POA: Insufficient documentation

## 2018-01-20 DIAGNOSIS — Z79899 Other long term (current) drug therapy: Secondary | ICD-10-CM | POA: Diagnosis not present

## 2018-01-20 DIAGNOSIS — R102 Pelvic and perineal pain: Secondary | ICD-10-CM | POA: Diagnosis not present

## 2018-01-20 DIAGNOSIS — O99343 Other mental disorders complicating pregnancy, third trimester: Secondary | ICD-10-CM | POA: Insufficient documentation

## 2018-01-20 DIAGNOSIS — F909 Attention-deficit hyperactivity disorder, unspecified type: Secondary | ICD-10-CM | POA: Insufficient documentation

## 2018-01-20 DIAGNOSIS — J45909 Unspecified asthma, uncomplicated: Secondary | ICD-10-CM | POA: Diagnosis not present

## 2018-01-20 DIAGNOSIS — N949 Unspecified condition associated with female genital organs and menstrual cycle: Secondary | ICD-10-CM

## 2018-01-20 LAB — URINALYSIS, ROUTINE W REFLEX MICROSCOPIC
Bilirubin Urine: NEGATIVE
Glucose, UA: NEGATIVE mg/dL
Hgb urine dipstick: NEGATIVE
Ketones, ur: 20 mg/dL — AB
Nitrite: NEGATIVE
Protein, ur: 30 mg/dL — AB
Specific Gravity, Urine: 1.019 (ref 1.005–1.030)
pH: 6 (ref 5.0–8.0)

## 2018-01-20 NOTE — MAU Provider Note (Signed)
History     CSN: [id number]  Arrival date and time: 01/20/18 1955   None     Chief Complaint  Patient presents with  . Contractions   HPI [name] is a 32yo [id number] @ 33.0wks by 6wk scan who presents for eval of pelvic pressure and ctx since 0400.  Denies leaking or bldg. She has had MAU visits for similar complaint x 4 in <2wks. FFN neg on 1/28. Cx exam 1/thick/-3 on 01/17/18. Her preg has been followed by the [facility] OB service and has been remarkable for 1) hx pre-e 2) hx 35.6wk del 3) hx GDM 4) short interval b/t preg 5) mental health concerns- MR, bipolar, ADHD. Has not had an office visit since 12/28.  OB History    Gravida Para Term Preterm AB Living   3 2 1 1  0 2   SAB TAB Ectopic Multiple Live Births   0     0 2      Past Medical History:  Diagnosis Date  . Anxiety   . Asthma    inhaler PRN-hasn't used inhaler in months  . Attention deficit hyperactivity disorder   . Bipolar disorder [facility])    hospitalized as a teen for suicidal ideations and cutting  . Depression   . Gastritis   . GERD (gastroesophageal reflux disease)   . HPV (human papilloma virus) anogenital infection   . Hx of varicella   . Mild preeclampsia 02/09/2016  . Vaginal Pap smear, abnormal     Past Surgical History:  Procedure Laterality Date  . EYE MUSCLE SURGERY Bilateral    07/29/12  . MOUTH SURGERY    . PLANTAR FASCIA SURGERY    . WISDOM TOOTH EXTRACTION      Family History  Problem Relation Age of Onset  . Depression Mother   . Colon cancer Mother   . Colon polyps Mother   . Depression Father   . Depression Brother   . Liver disease Unknown   . Kidney disease Unknown     Social History   Tobacco Use  . Smoking status: Never Smoker  . Smokeless tobacco: Never Used  Substance Use Topics  . Alcohol use: No    Alcohol/week: 0.0 oz  . Drug use: No    Allergies:  Allergies  Allergen Reactions  . Depakote [Divalproex Sodium] Other (See Comments)    Pt states that this  medication makes her blood levels toxic.    [name] Methylphenidate Derivatives Other (See Comments)    Reaction:  Depression and anger   . Neurontin [Gabapentin] Other (See Comments)    Reaction:  Dizziness   . Prozac [Fluoxetine Hcl] Other (See Comments)    Reaction:  Anger     Medications Prior to Admission  Medication Sig Dispense Refill Last Dose  . Prenatal Vit-Fe Fumarate-FA (PRENATAL MULTIVITAMIN) TABS tablet Take 1 tablet at bedtime by mouth.    01/19/2018 at Unknown time  . aspirin EC 81 MG tablet Take 1 tablet (81 mg total) by mouth daily. Take after 12 weeks for prevention of preeclampsia later in pregnancy (Patient not taking: Reported on 12/21/2017) 300 tablet 2 Not Taking at Unknown time  . chlorproMAZINE (THORAZINE) 25 MG tablet Take 25 mg 3 (three) times daily by mouth.    01/05/2018 at Unknown time  . metoCLOPramide (REGLAN) 10 MG tablet Take 1 tablet (10 mg total) by mouth every 6 (six) hours. 30 tablet 0 Past Month at Unknown time    Review of Systems no  other pertinents other than what is listed in HPI  Physical Exam   Blood pressure 114/70, pulse 89, temperature 98.8 F (37.1 C), resp. rate 19, SpO2 99 %, unknown if currently breastfeeding.  Physical Exam  Constitutional: She is oriented to person, place, and time. She appears well-developed.  HENT:  Head: Normocephalic.  Neck: Normal range of motion.  Cardiovascular: Normal rate.  Respiratory: Effort normal.  GI:  EFM 130s, +accels, no decels No ctx per toco  Genitourinary: Vagina normal.  Genitourinary Comments: Cx FT int os/thick/-3  Musculoskeletal: Normal range of motion.  Neurological: She is alert and oriented to person, place, and time.  Skin: Skin is warm and dry.  Psychiatric: She has a normal mood and affect. Her behavior is normal. Thought content normal.   Urinalysis    Component Value Date/Time   COLORURINE YELLOW 01/20/2018 2115   APPEARANCEUR CLOUDY (A) 01/20/2018 2115   LABSPEC 1.019  01/20/2018 2115   PHURINE 6.0 01/20/2018 2115   GLUCOSEU NEGATIVE 01/20/2018 2115   HGBUR NEGATIVE 01/20/2018 2115   BILIRUBINUR NEGATIVE 01/20/2018 2115   BILIRUBINUR small 11/06/2014 1620   KETONESUR 20 (A) 01/20/2018 2115   PROTEINUR 30 (A) 01/20/2018 2115   UROBILINOGEN 0.2 10/08/2015 1650   NITRITE NEGATIVE 01/20/2018 2115   LEUKOCYTESUR MODERATE (A) 01/20/2018 2115    MAU Course  Procedures  MDM UA, NST  Assessment and Plan  IUP@33 .0wks Pelvic pressure  D/C home with preterm labor precautions Strongly encouraged pt to make/keep prenatal appts as this is the way to receive the most thorough care Inbox msg sent to [facility] with pt's new number  [name] CNM 01/20/2018, 9:28 PM

## 2018-01-20 NOTE — Discharge Instructions (Signed)
Preterm Labor and Birth Information °Pregnancy normally lasts 39-41 weeks. Preterm labor is when labor starts early. It starts before you have been pregnant for 37 whole weeks. °What are the risk factors for preterm labor? °Preterm labor is more likely to occur in women who: °· Have an infection while pregnant. °· Have a cervix that is short. °· Have gone into preterm labor before. °· Have had surgery on their cervix. °· Are younger than age 31. °· Are older than age 35. °· Are African American. °· Are pregnant with two or more babies. °· Take street drugs while pregnant. °· Smoke while pregnant. °· Do not gain enough weight while pregnant. °· Got pregnant right after another pregnancy. ° °What are the symptoms of preterm labor? °Symptoms of preterm labor include: °· Cramps. The cramps may feel like the cramps some women get during their period. The cramps may happen with watery poop (diarrhea). °· Pain in the belly (abdomen). °· Pain in the lower back. °· Regular contractions or tightening. It may feel like your belly is getting tighter. °· Pressure in the lower belly that seems to get stronger. °· More fluid (discharge) leaking from the vagina. The fluid may be watery or bloody. °· Water breaking. ° °Why is it important to notice signs of preterm labor? °Babies who are born early may not be fully developed. They have a higher chance for: °· Long-term heart problems. °· Long-term lung problems. °· Trouble controlling body systems, like breathing. °· Bleeding in the brain. °· A condition called cerebral palsy. °· Learning difficulties. °· Death. ° °These risks are highest for babies who are born before 34 weeks of pregnancy. °How is preterm labor treated? °Treatment depends on: °· How long you were pregnant. °· Your condition. °· The health of your baby. ° °Treatment may involve: °· Having a stitch (suture) placed in your cervix. When you give birth, your cervix opens so the baby can come out. The stitch keeps the  cervix from opening too soon. °· Staying at the hospital. °· Taking or getting medicines, such as: °? Hormone medicines. °? Medicines to stop contractions. °? Medicines to help the baby’s lungs develop. °? Medicines to prevent your baby from having cerebral palsy. ° °What should I do if I am in preterm labor? °If you think you are going into labor too soon, call your doctor right away. °How can I prevent preterm labor? °· Do not use any tobacco products. °? Examples of these are cigarettes, chewing tobacco, and e-cigarettes. °? If you need help quitting, ask your doctor. °· Do not use street drugs. °· Do not use any medicines unless you ask your doctor if they are safe for you. °· Talk with your doctor before taking any herbal supplements. °· Make sure you gain enough weight. °· Watch for infection. If you think you might have an infection, get it checked right away. °· If you have gone into preterm labor before, tell your doctor. °This information is not intended to replace advice given to you by your health care provider. Make sure you discuss any questions you have with your health care provider. °Document Released: 02/27/2009 Document Revised: 05/13/2016 Document Reviewed: 04/23/2016 °Elsevier Interactive Patient Education © 2018 [employer] ° °

## 2018-01-20 NOTE — MAU Note (Signed)
Reports ctx since early this AM.  Can't say whether they are regular or irregular.  No VB/LOF.  Reports good FM.

## 2018-01-25 ENCOUNTER — Encounter (HOSPITAL_COMMUNITY): Payer: Self-pay | Admitting: *Deleted

## 2018-01-25 ENCOUNTER — Other Ambulatory Visit: Payer: Self-pay

## 2018-01-25 ENCOUNTER — Inpatient Hospital Stay (HOSPITAL_COMMUNITY)
Admission: AD | Admit: 2018-01-25 | Discharge: 2018-01-25 | Disposition: A | Discharge status: Home / Self Care | Payer: Medicare Other | Source: Ambulatory Visit | Attending: Obstetrics & Gynecology | Admitting: Obstetrics & Gynecology

## 2018-01-25 DIAGNOSIS — O4703 False labor before 37 completed weeks of gestation, third trimester: Secondary | ICD-10-CM | POA: Insufficient documentation

## 2018-01-25 DIAGNOSIS — Z8371 Family history of colonic polyps: Secondary | ICD-10-CM | POA: Diagnosis not present

## 2018-01-25 DIAGNOSIS — O479 False labor, unspecified: Secondary | ICD-10-CM

## 2018-01-25 DIAGNOSIS — Z818 Family history of other mental and behavioral disorders: Secondary | ICD-10-CM | POA: Insufficient documentation

## 2018-01-25 DIAGNOSIS — Z888 Allergy status to other drugs, medicaments and biological substances status: Secondary | ICD-10-CM | POA: Diagnosis not present

## 2018-01-25 DIAGNOSIS — Z8379 Family history of other diseases of the digestive system: Secondary | ICD-10-CM | POA: Diagnosis not present

## 2018-01-25 DIAGNOSIS — O99613 Diseases of the digestive system complicating pregnancy, third trimester: Secondary | ICD-10-CM | POA: Diagnosis not present

## 2018-01-25 DIAGNOSIS — Z3A33 33 weeks gestation of pregnancy: Secondary | ICD-10-CM | POA: Diagnosis not present

## 2018-01-25 DIAGNOSIS — F319 Bipolar disorder, unspecified: Secondary | ICD-10-CM | POA: Insufficient documentation

## 2018-01-25 DIAGNOSIS — Z841 Family history of disorders of kidney and ureter: Secondary | ICD-10-CM | POA: Insufficient documentation

## 2018-01-25 DIAGNOSIS — Z8619 Personal history of other infectious and parasitic diseases: Secondary | ICD-10-CM | POA: Insufficient documentation

## 2018-01-25 DIAGNOSIS — F419 Anxiety disorder, unspecified: Secondary | ICD-10-CM | POA: Diagnosis not present

## 2018-01-25 DIAGNOSIS — Z9889 Other specified postprocedural states: Secondary | ICD-10-CM | POA: Diagnosis not present

## 2018-01-25 DIAGNOSIS — Z800 Family history of malignant neoplasm of digestive organs: Secondary | ICD-10-CM | POA: Insufficient documentation

## 2018-01-25 DIAGNOSIS — Z79899 Other long term (current) drug therapy: Secondary | ICD-10-CM | POA: Insufficient documentation

## 2018-01-25 DIAGNOSIS — K219 Gastro-esophageal reflux disease without esophagitis: Secondary | ICD-10-CM | POA: Diagnosis not present

## 2018-01-25 DIAGNOSIS — R109 Unspecified abdominal pain: Secondary | ICD-10-CM | POA: Diagnosis present

## 2018-01-25 DIAGNOSIS — O99343 Other mental disorders complicating pregnancy, third trimester: Secondary | ICD-10-CM | POA: Insufficient documentation

## 2018-01-25 DIAGNOSIS — Z7982 Long term (current) use of aspirin: Secondary | ICD-10-CM | POA: Insufficient documentation

## 2018-01-25 DIAGNOSIS — O0973 Supervision of high risk pregnancy due to social problems, third trimester: Secondary | ICD-10-CM

## 2018-01-25 LAB — URINALYSIS, ROUTINE W REFLEX MICROSCOPIC
Bilirubin Urine: NEGATIVE
Glucose, UA: NEGATIVE mg/dL
Hgb urine dipstick: NEGATIVE
Ketones, ur: 80 mg/dL — AB
Nitrite: NEGATIVE
Protein, ur: 30 mg/dL — AB
Specific Gravity, Urine: 1.019 (ref 1.005–1.030)
pH: 6 (ref 5.0–8.0)

## 2018-01-25 LAB — FETAL FIBRONECTIN: Fetal Fibronectin: NEGATIVE

## 2018-01-25 MED ORDER — ACETAMINOPHEN 500 MG PO TABS
1000.0000 mg | ORAL_TABLET | Freq: Once | ORAL | Status: AC
  Administered 2018-01-25: 1000 mg via ORAL
  Filled 2018-01-25: qty 2

## 2018-01-25 NOTE — MAU Note (Signed)
End of shift report given to [place], Therapist, sports.

## 2018-01-25 NOTE — MAU Provider Note (Signed)
Chief Complaint:  Contractions   None     HPI: [name] is a 32 y.o. [id number] at 105w5d who presents to maternity admissions reporting onset of painful contractions last night. She has had 5 MAU visits in 2 weeks with similar complaints. Contractions tonight are irregular but average about 10 minutes apart. Her pain is low in her abdomen, intermittent, and radiates around to her lower back. The contraction pain is associated with pelvic pressure. She has not tried any treatments. She reports she does not have anything at home but ibuprofen, which she knows she cannot take.  There are no other associated symptoms.  She had negative FFN 2 weeks ago on 1/31.  HPI  Past Medical History: Past Medical History:  Diagnosis Date  . Anxiety   . Asthma    inhaler PRN-hasn't used inhaler in months  . Attention deficit hyperactivity disorder   . Bipolar disorder [facility])    hospitalized as a teen for suicidal ideations and cutting  . Depression   . Gastritis   . GERD (gastroesophageal reflux disease)   . HPV (human papilloma virus) anogenital infection   . Hx of varicella   . Mild preeclampsia 02/09/2016  . Vaginal Pap smear, abnormal     Past obstetric history: OB History  Gravida Para Term Preterm AB Living  3 2 1 1  0 2  SAB TAB Ectopic Multiple Live Births  0     0 2    # Outcome Date GA Lbr Len/2nd Weight Sex Delivery Anes PTL Lv  3 Current           2 Preterm 02/03/17 [redacted]w[redacted]d  7 lb 7.9 oz (3.4 kg) M Vag-Spont EPI  LIV  1 Term 02/11/16 [redacted]w[redacted]d / 01:14 8 lb 2.6 oz (3.702 kg) F Vag-Spont EPI  LIV      Past Surgical History: Past Surgical History:  Procedure Laterality Date  . EYE MUSCLE SURGERY Bilateral    07/29/12  . MOUTH SURGERY    . PLANTAR FASCIA SURGERY    . WISDOM TOOTH EXTRACTION      Family History: Family History  Problem Relation Age of Onset  . Depression Mother   . Colon cancer Mother   . Colon polyps Mother   . Depression Father   . Depression Brother   .  Liver disease Unknown   . Kidney disease Unknown     Social History: Social History   Tobacco Use  . Smoking status: Never Smoker  . Smokeless tobacco: Never Used  Substance Use Topics  . Alcohol use: No    Alcohol/week: 0.0 oz  . Drug use: No    Allergies:  Allergies  Allergen Reactions  . Depakote [Divalproex Sodium] Other (See Comments)    Pt states that this medication makes her blood levels toxic.    [name] Methylphenidate Derivatives Other (See Comments)    Reaction:  Depression and anger   . Neurontin [Gabapentin] Other (See Comments)    Reaction:  Dizziness   . Prozac [Fluoxetine Hcl] Other (See Comments)    Reaction:  Anger     Meds:  Medications Prior to Admission  Medication Sig Dispense Refill Last Dose  . aspirin EC 81 MG tablet Take 1 tablet (81 mg total) by mouth daily. Take after 12 weeks for prevention of preeclampsia later in pregnancy (Patient not taking: Reported on 12/21/2017) 300 tablet 2 Not Taking at Unknown time  . chlorproMAZINE (THORAZINE) 25 MG tablet Take 25 mg 3 (three) times  daily by mouth.    01/05/2018 at Unknown time  . metoCLOPramide (REGLAN) 10 MG tablet Take 1 tablet (10 mg total) by mouth every 6 (six) hours. 30 tablet 0 Past Month at Unknown time  . Prenatal Vit-Fe Fumarate-FA (PRENATAL MULTIVITAMIN) TABS tablet Take 1 tablet at bedtime by mouth.    01/19/2018 at Unknown time    ROS:  Review of Systems  Constitutional: Negative for chills, fatigue and fever.  Eyes: Negative for visual disturbance.  Respiratory: Negative for shortness of breath.   Cardiovascular: Negative for chest pain.  Gastrointestinal: Negative for abdominal pain, nausea and vomiting.  Genitourinary: Negative for difficulty urinating, dysuria, flank pain, pelvic pain, vaginal bleeding, vaginal discharge and vaginal pain.  Neurological: Negative for dizziness and headaches.  Psychiatric/Behavioral: Negative.      I have reviewed patient's Past Medical Hx, Surgical Hx,  Family Hx, Social Hx, medications and allergies.   Physical Exam   Patient Vitals for the past 24 hrs:  BP Temp Pulse Resp SpO2 Height  01/25/18 2106 137/86 -- (!) 106 -- -- --  01/25/18 2000 -- -- -- -- 96 % --  01/25/18 1930 -- -- -- -- 98 % --  01/25/18 1909 115/85 98.4 F (36.9 C) (!) 102 18 97 % 5\' 3"  (1.6 m)   Constitutional: Well-developed, well-nourished female in no acute distress.  Cardiovascular: normal rate Respiratory: normal effort GI: Abd soft, non-tender, gravid appropriate for gestational age.  MS: Extremities nontender, no edema, normal ROM Neurologic: Alert and oriented x 4.  GU: Neg CVAT.  PELVIC EXAM: Cervix pink, visually closed, without lesion, scant white creamy discharge, vaginal walls and external genitalia normal Bimanual exam: Cervix 0/long/high, firm, anterior, neg CMT, uterus nontender, nonenlarged, adnexa without tenderness, enlargement, or mass  Dilation: 1.5 Effacement (%): Thick Cervical Position: Anterior  FHT:  Baseline 135 , moderate variability, accelerations present, no decelerations Contractions: q 3-15 mins, mild to palpation   Labs: Results for orders placed or performed during the hospital encounter of 01/25/18 (from the past 24 hour(s))  Fetal fibronectin     Status: None   Collection Time: 01/25/18  8:14 PM  Result Value Ref Range   Fetal Fibronectin NEGATIVE NEGATIVE   --/--/O POS (02/19 1907)  Imaging:    MAU Course/MDM: I have ordered labs and reviewed results.  NST reviewed and reactive Contractions irregular on toco Tylenol 1000 mg PO given with some relief of cramping Cervix on today's exam 1.5 cm, slight change from previous exam of 1 cm. Cervix remains thick, similar to previous exams. FFN sent and result negative No evidence of preterm labor today Reviewed signs of labor and reasons to come to MAU Pt to increase PO fluids Pt to f/u with prenatal care at [facility] was sent at last MAU visit to office to  reestablish care Pt discharge with strict preterm labor precautions.   Assessment: 1. Threatened preterm labor, third trimester   2. Supervision of high risk pregnancy due to social problems in third trimester   3. Braxton Hicks contractions     Plan: Discharge home Labor precautions and fetal kick counts Follow-up Information    [facility].   Contact information: [address] [place] [address] [phone]         Allergies as of 01/25/2018      Reactions   Depakote [divalproex Sodium] Other (See Comments)   Pt states that this medication makes her blood levels toxic.  Methylphenidate Derivatives Other (See Comments)   Reaction:  Depression and anger    Neurontin [gabapentin] Other (See Comments)   Reaction:  Dizziness    Prozac [fluoxetine Hcl] Other (See Comments)   Reaction:  Anger       Medication List    TAKE these medications   aspirin EC 81 MG tablet Take 1 tablet (81 mg total) by mouth daily. Take after 12 weeks for prevention of preeclampsia later in pregnancy   chlorproMAZINE 25 MG tablet Commonly known as:  THORAZINE Take 25 mg 3 (three) times daily by mouth.   metoCLOPramide 10 MG tablet Commonly known as:  REGLAN Take 1 tablet (10 mg total) by mouth every 6 (six) hours.   prenatal multivitamin Tabs tablet Take 1 tablet at bedtime by mouth.       Fatima Blank Certified Nurse-Midwife 01/25/2018 9:23 PM

## 2018-01-25 NOTE — MAU Note (Signed)
Pt. Arrived via EMS, contractions started last night around 2000. Pt. Is here due to contractions getting more intense.  Positive for fetal movement, denies sudden gush of fluid, no vaginal bleeding noted.  EFM applied - 130s, Toco applied - abd.soft.

## 2018-02-01 ENCOUNTER — Inpatient Hospital Stay (HOSPITAL_COMMUNITY)
Admission: AD | Admit: 2018-02-01 | Discharge: 2018-02-02 | Disposition: A | Discharge status: Home / Self Care | Payer: Medicare Other | Source: Ambulatory Visit | Attending: Obstetrics and Gynecology | Admitting: Obstetrics and Gynecology

## 2018-02-01 DIAGNOSIS — Z3A34 34 weeks gestation of pregnancy: Secondary | ICD-10-CM | POA: Diagnosis not present

## 2018-02-01 DIAGNOSIS — O0973 Supervision of high risk pregnancy due to social problems, third trimester: Secondary | ICD-10-CM

## 2018-02-01 DIAGNOSIS — O4703 False labor before 37 completed weeks of gestation, third trimester: Secondary | ICD-10-CM | POA: Insufficient documentation

## 2018-02-01 DIAGNOSIS — R109 Unspecified abdominal pain: Secondary | ICD-10-CM | POA: Diagnosis present

## 2018-02-01 DIAGNOSIS — O479 False labor, unspecified: Secondary | ICD-10-CM

## 2018-02-01 DIAGNOSIS — O26893 Other specified pregnancy related conditions, third trimester: Secondary | ICD-10-CM | POA: Diagnosis present

## 2018-02-02 ENCOUNTER — Encounter (HOSPITAL_COMMUNITY): Payer: Self-pay | Admitting: *Deleted

## 2018-02-02 DIAGNOSIS — O4703 False labor before 37 completed weeks of gestation, third trimester: Secondary | ICD-10-CM | POA: Diagnosis not present

## 2018-02-02 DIAGNOSIS — Z3A34 34 weeks gestation of pregnancy: Secondary | ICD-10-CM | POA: Diagnosis not present

## 2018-02-02 LAB — URINALYSIS, ROUTINE W REFLEX MICROSCOPIC
Glucose, UA: NEGATIVE mg/dL
Hgb urine dipstick: NEGATIVE
Ketones, ur: NEGATIVE mg/dL
Nitrite: NEGATIVE
Protein, ur: 100 mg/dL — AB
Specific Gravity, Urine: 1.025 (ref 1.005–1.030)
pH: 6 (ref 5.0–8.0)

## 2018-02-02 NOTE — MAU Provider Note (Signed)
History    Patient [name] is a 32 y.o. [id number] At [redacted]w[redacted]d here with complaints of contractions. She also says she has increased discharged. She denies decreased fetal movements, bleeding, ROM or other ob-gyn complaints. SHe is very anxious that she is going into labor and she is worried about her infant being taken away by CPS.  CSN: [id number]  Arrival date and time: 02/01/18 2348   None     No chief complaint on file.  Abdominal Pain  This is a new problem. The current episode started today. The onset quality is sudden. The problem occurs intermittently. The problem has been unchanged. The pain is located in the suprapubic region. The pain is at a severity of 8/10. The quality of the pain is cramping. The abdominal pain radiates to the periumbilical region. Pertinent negatives include no diarrhea, dysuria, frequency, headaches, nausea or vomiting. Nothing aggravates the pain. The pain is relieved by nothing. She has tried nothing for the symptoms.       OB History    Gravida Para Term Preterm AB Living   3 2 1 1  0 2   SAB TAB Ectopic Multiple Live Births   0     0 2      Past Medical History:  Diagnosis Date  . Anxiety   . Asthma    inhaler PRN-hasn't used inhaler in months  . Attention deficit hyperactivity disorder   . Bipolar disorder [facility])    hospitalized as a teen for suicidal ideations and cutting  . Depression   . Gastritis   . GERD (gastroesophageal reflux disease)   . HPV (human papilloma virus) anogenital infection   . Hx of varicella   . Mild preeclampsia 02/09/2016  . Vaginal Pap smear, abnormal     Past Surgical History:  Procedure Laterality Date  . EYE MUSCLE SURGERY Bilateral    07/29/12  . MOUTH SURGERY    . PLANTAR FASCIA SURGERY    . WISDOM TOOTH EXTRACTION      Family History  Problem Relation Age of Onset  . Depression Mother   . Colon cancer Mother   . Colon polyps Mother   . Depression Father   . Depression Brother   . Liver  disease Unknown   . Kidney disease Unknown     Social History   Tobacco Use  . Smoking status: Never Smoker  . Smokeless tobacco: Never Used  Substance Use Topics  . Alcohol use: No    Alcohol/week: 0.0 oz  . Drug use: No    Allergies:  Allergies  Allergen Reactions  . Depakote [Divalproex Sodium] Other (See Comments)    Pt states that this medication makes her blood levels toxic.    [name] Methylphenidate Derivatives Other (See Comments)    Reaction:  Depression and anger   . Neurontin [Gabapentin] Other (See Comments)    Reaction:  Dizziness   . Prozac [Fluoxetine Hcl] Other (See Comments)    Reaction:  Anger     Medications Prior to Admission  Medication Sig Dispense Refill Last Dose  . Prenatal Vit-Fe Fumarate-FA (PRENATAL MULTIVITAMIN) TABS tablet Take 1 tablet at bedtime by mouth.    02/01/2018 at Unknown time  . aspirin EC 81 MG tablet Take 1 tablet (81 mg total) by mouth daily. Take after 12 weeks for prevention of preeclampsia later in pregnancy (Patient not taking: Reported on 12/21/2017) 300 tablet 2 Not Taking at Unknown time  . chlorproMAZINE (THORAZINE) 25 MG tablet Take 25  mg 3 (three) times daily by mouth.    More than a month at Unknown time  . metoCLOPramide (REGLAN) 10 MG tablet Take 1 tablet (10 mg total) by mouth every 6 (six) hours. 30 tablet 0 More than a month at Unknown time    Review of Systems  HENT: Negative.   Respiratory: Negative.   Cardiovascular: Negative.   Gastrointestinal: Positive for abdominal pain. Negative for diarrhea, nausea and vomiting.  Genitourinary: Negative for dysuria and frequency.  Neurological: Negative for headaches.   Physical Exam   Blood pressure 125/82, pulse (!) 106, temperature 98 F (36.7 C), temperature source Oral, resp. rate 16, height 5\' 3"  (1.6 m), weight 198 lb 4 oz (89.9 kg), unknown if currently breastfeeding.  Physical Exam  Constitutional: She is oriented to person, place, and time. She appears  well-developed.  HENT:  Head: Normocephalic.  Neck: Normal range of motion.  Respiratory: Effort normal.  GI: Soft.  Musculoskeletal: Normal range of motion.  Neurological: She is alert and oriented to person, place, and time.  Skin: Skin is warm and dry.  Psychiatric: She has a normal mood and affect.  Cervical exam 1.5/long and thick  MAU Course  Procedures  MDM -NST: 135 bpm, mod var, present acel, neg decels, neg contractions -ffn not done -UA negative for signs of infection.   Assessment and Plan   1. Braxton Hick's contraction   2. Supervision of high risk pregnancy due to social problems in third trimester    2. Will send message to clinic to schedule patient's next visit as her phone number has changed.  3. Reviewed warning signs and when to return to the MAU; patient and husband verbalized understanding.  4. All questions answered.  [place] CNM 02/02/2018, 12:54 AM

## 2018-02-02 NOTE — Discharge Instructions (Signed)
Braxton Hicks Contractions °Contractions of the uterus can occur throughout pregnancy, but they are not always a sign that you are in labor. You may have practice contractions called Braxton Hicks contractions. These false labor contractions are sometimes confused with true labor. °What are Braxton Hicks contractions? °Braxton Hicks contractions are tightening movements that occur in the muscles of the uterus before labor. Unlike true labor contractions, these contractions do not result in opening (dilation) and thinning of the cervix. Toward the end of pregnancy (32-34 weeks), Braxton Hicks contractions can happen more often and may become stronger. These contractions are sometimes difficult to tell apart from true labor because they can be very uncomfortable. You should not feel embarrassed if you go to the hospital with false labor. °Sometimes, the only way to tell if you are in true labor is for your health care provider to look for changes in the cervix. The health care provider will do a physical exam and may monitor your contractions. If you are not in true labor, the exam should show that your cervix is not dilating and your water has not broken. °If there are other health problems associated with your pregnancy, it is completely safe for you to be sent home with false labor. You may continue to have Braxton Hicks contractions until you go into true labor. °How to tell the difference between true labor and false labor °True labor °· Contractions last 30-70 seconds. °· Contractions become very regular. °· Discomfort is usually felt in the top of the uterus, and it spreads to the lower abdomen and low back. °· Contractions do not go away with walking. °· Contractions usually become more intense and increase in frequency. °· The cervix dilates and gets thinner. °False labor °· Contractions are usually shorter and not as strong as true labor contractions. °· Contractions are usually irregular. °· Contractions  are often felt in the front of the lower abdomen and in the groin. °· Contractions may go away when you walk around or change positions while lying down. °· Contractions get weaker and are shorter-lasting as time goes on. °· The cervix usually does not dilate or become thin. °Follow these instructions at home: °· Take over-the-counter and prescription medicines only as told by your health care provider. °· Keep up with your usual exercises and follow other instructions from your health care provider. °· Eat and drink lightly if you think you are going into labor. °· If Braxton Hicks contractions are making you uncomfortable: °? Change your position from lying down or resting to walking, or change from walking to resting. °? Sit and rest in a tub of warm water. °? Drink enough fluid to keep your urine pale yellow. Dehydration may cause these contractions. °? Do slow and deep breathing several times an hour. °· Keep all follow-up prenatal visits as told by your health care provider. This is important. °Contact a health care provider if: °· You have a fever. °· You have continuous pain in your abdomen. °Get help right away if: °· Your contractions become stronger, more regular, and closer together. °· You have fluid leaking or gushing from your vagina. °· You pass blood-tinged mucus (bloody show). °· You have bleeding from your vagina. °· You have low back pain that you never had before. °· You feel your baby’s head pushing down and causing pelvic pressure. °· Your baby is not moving inside you as much as it used to. °Summary °· Contractions that occur before labor are called Braxton   Hicks contractions, false labor, or practice contractions. °· Braxton Hicks contractions are usually shorter, weaker, farther apart, and less regular than true labor contractions. True labor contractions usually become progressively stronger and regular and they become more frequent. °· Manage discomfort from Braxton Hicks contractions by  changing position, resting in a warm bath, drinking plenty of water, or practicing deep breathing. °This information is not intended to replace advice given to you by your health care provider. Make sure you discuss any questions you have with your health care provider. °Document Released: 04/16/2017 Document Revised: 04/16/2017 Document Reviewed: 04/16/2017 °Elsevier Interactive Patient Education © 2018 [employer] ° °

## 2018-02-07 ENCOUNTER — Other Ambulatory Visit: Payer: Self-pay

## 2018-02-07 ENCOUNTER — Inpatient Hospital Stay (HOSPITAL_COMMUNITY)
Admission: AD | Admit: 2018-02-07 | Discharge: 2018-02-07 | Disposition: A | Discharge status: Home / Self Care | Payer: Medicare Other | Source: Ambulatory Visit | Attending: Obstetrics & Gynecology | Admitting: Obstetrics & Gynecology

## 2018-02-07 ENCOUNTER — Encounter (HOSPITAL_COMMUNITY): Payer: Self-pay | Admitting: *Deleted

## 2018-02-07 DIAGNOSIS — O4703 False labor before 37 completed weeks of gestation, third trimester: Secondary | ICD-10-CM

## 2018-02-07 DIAGNOSIS — R102 Pelvic and perineal pain: Secondary | ICD-10-CM | POA: Diagnosis not present

## 2018-02-07 DIAGNOSIS — O26893 Other specified pregnancy related conditions, third trimester: Secondary | ICD-10-CM | POA: Insufficient documentation

## 2018-02-07 DIAGNOSIS — Z7982 Long term (current) use of aspirin: Secondary | ICD-10-CM | POA: Diagnosis not present

## 2018-02-07 DIAGNOSIS — Z3A35 35 weeks gestation of pregnancy: Secondary | ICD-10-CM | POA: Insufficient documentation

## 2018-02-07 DIAGNOSIS — O9989 Other specified diseases and conditions complicating pregnancy, childbirth and the puerperium: Secondary | ICD-10-CM

## 2018-02-07 DIAGNOSIS — M7918 Myalgia, other site: Secondary | ICD-10-CM

## 2018-02-07 DIAGNOSIS — O0973 Supervision of high risk pregnancy due to social problems, third trimester: Secondary | ICD-10-CM

## 2018-02-07 DIAGNOSIS — O99891 Other specified diseases and conditions complicating pregnancy: Secondary | ICD-10-CM

## 2018-02-07 MED ORDER — CYCLOBENZAPRINE HCL 10 MG PO TABS: 10.0000 mg | ORAL_TABLET | Freq: Once | ORAL | Status: DC

## 2018-02-07 MED ORDER — COMFORT FIT MATERNITY SUPP MED MISC: 1.0000 | Freq: Every day | 0 refills | Status: DC

## 2018-02-07 MED ORDER — CYCLOBENZAPRINE HCL 10 MG PO TABS: 5.0000 mg | ORAL_TABLET | Freq: Three times a day (TID) | ORAL | 0 refills | Status: DC | PRN

## 2018-02-07 NOTE — MAU Note (Signed)
Pt reports that when she gets up she is hardly able to walk because of the pain.

## 2018-02-07 NOTE — Discharge Instructions (Signed)
Go to [email or web address]-[email or web address]    Pubic Symphysis Pain/Dysfunction  What is symphysis pubis dysfunction?  Symphysis pubis dysfunction (SPD) is a problem with the pelvis. Your pelvis is mainly formed of two pubic bones that curve round to make a cradle shape. The pubic bones meet at the front of your pelvis, at a firm joint called the symphysis pubis.   The joint's connection is made strong by a dense network of tough tissues (ligaments). During pregnancy, swelling and pain can make the symphysis pubis joint less stable, causing SPD.  Doctors and physiotherapists classify any type of pelvic pain during pregnancy as pelvic girdle pain (PGP).   SPD is one type of pelvic girdle pain. Diastasis symphysis pubis (DSP) is another type of pelvic girdle pain, which is related to SPD. DSP happens when the gap in the symphysis pubis joint widens too far. DSP is rare, and can only be diagnosed by an X-ray, ultrasound scan or MRI scan. What are the symptoms of SPD? Pain in the pubic area and groin are the most common symptoms, though you may also notice:   Back pain, pain at the back of your pelvis or hip pain.  Pain, along with a grinding or clicking sensation in your pubic area.  Pain down the inside of your thighs or between your legs.  Pain that's made worse by parting your legs, walking, going up or down stairs or moving around in bed.  Pain that's worse at night and stops you from sleeping well. Getting up to go to the toilet in the middle of the night can be especially painful.  SPD can occur at any time during your pregnancy or after giving birth. You may notice it for the first time during the middle of your pregnancy. What causes SPD?During pregnancy, your body produces a hormone called relaxin, which softens your ligaments to help your baby pass through your pelvis. This means that the joints in your pelvis naturally become more lax.   However, this  flexibility doesn't necessarily cause the painful problems of SPD. Usually, your nerves and muscles are able to adapt and compensate for the greater flexibility in your joints. This means your body should cope well with the changes to your posture as your baby grows.  SPD is thought to happen when your body doesn't adapt so well to the stretchier, looser ligaments caused by relaxin. SPD can be triggered by:  the joints in your pelvis moving unevenly  changes to the way your muscles work to support your pelvic girdle joints  one pelvic joint not working properly and causing knock-on pain in the other joints of your pelvis These problems mean that your pelvis is not as stable as it should be, and this is what causes SPD. Physiotherapy is the best way to treat SPD, because it's about the relationship between your muscles and bones, rather than how lax your joints are. You're more likely to develop SPD if:  you had pelvic girdle pain or pelvic joint pain before you became pregnant  you've had a previous injury to your pelvis  you've had pelvic girdle pain in a previous pregnancy  you have a high BMI and were overweight before you became pregnant  hypermobility in all your joints  How is SPD diagnosed? Your doctor or midwife should refer you to a womens health physiotherapist. Your physiotherapist will test the stability, movement and pain in your pelvic joints and muscles. How is SPD treated?SPD is managed in the same way as  other pelvic girdle pain. Treatment includes:   Exercises to strengthen your spinal, tummy, pelvic girdle, hip and pelvic floor muscles. These will improve the stability of your pelvis and back. You may need gentle, hands-on treatment of your hip, back or pelvis to correct stiffness or imbalance. Water gymnastics can sometimes help.  Your physiotherapist should advise you on how to make daily activities less painful and on how to make the birth of your baby easier. Your midwife  should help you to write a birth plan that takes into account your SPD symptoms.  Acupuncture may help reduce the pain and is safe during pregnancy. Make sure your practitioner is trained and experienced in working with pregnant women.  Other manual therapies, such as osteopathy may help. See a registered practitioner who is experienced in treating pregnant women.  A pelvic support belt may give relief, particularly when you're exercising or active. What can I do to ease the pain of SPD?  Be as active as you can, but don't push yourself so far that it hurts.  Stick to the pelvic floor and tummy exercises that your physiotherapist recommends.  Ask for and accept offers of help with daily chores.  Plan ahead so that you reduce the activities that cause you problems. You could use a rucksack to carry things around, both indoors and out.  Take care to part your legs no further than your pain-free range, particularly when getting in and out of the car, bed or bath. If you are lying down, pull up your knees as far as you can to make it easier to part your legs. If you are sitting, try arching your back and sticking your chest out before parting or moving your legs.  Avoid activities that make your pain worse or that put your pelvis in an uneven position, such as sitting cross-legged or carrying your toddler on your hip. If something hurts, stop doing it. If the pain is allowed to flare up, it can take a long time to settle down again.  Try to sleep on your side with legs bent and a pillow between your knees.  Rest regularly or sit down for activities you would normally do standing, such as ironing. By sitting on a birth ball or by getting down on your hands and knees, you'll take the weight of your baby off your pelvis.  Try not to do heavy lifting or pushing. Pushing supermarket trolleys can often make your pain worse, so shop online or ask someone to shop for you.  When climbing stairs, take one step at a  time. Step up onto one step with your best leg and then bring your other leg to meet it. Repeat with each step.  Avoid standing on one leg. When getting dressed, sit down to pull on your knickers or trousers. Will I recover from SPD after Ive had my baby ? Youre very likely to recover within a few weeks to a few months after your baby is born. If you can, carry on with physiotherapy after the birth. Try to get help with looking after your baby during the early weeks.   You may find you get twinges every month just before your period is due. This is likely to be caused by hormones that have a similar effect to pregnancy hormones.   If you have SPD in one pregnancy, it is more likely that youll have it next time you get pregnant. Ask your midwife to refer you to a physiotherapist  early on. SPD may not necessarily be as bad next time if it is well managed from the start of pregnancy.   You could consider giving yourself a bit of time from one pregnancy to the next. Losing excess weight, getting fit and waiting until your children can walk may help reduce the symptoms of getting any type of pelvic pain next time. Where can I get help and support?[facility] can provide a list of physiotherapists in your area.  You can get in touch with other women in your situation by contacting [employer], a charity that offers support to women with pelvic girdle pain, including SPD, or by visiting our community.   More tips and advice:  Go to [email or web address] for more information  See our photo guide to pregnancy stretches, designed to help relieve those aches and pains.  Watch our video for tips on how to get comfortable in bed  Discover how SPD might affect your labour.  Last reviewed: July 2015  Next review: July 2018

## 2018-02-07 NOTE — Progress Notes (Signed)
PT's ride here so did not want to take Flexeril here. WRitten and verbal d/c instructions given and understanding voiced.

## 2018-02-07 NOTE — MAU Note (Signed)
Pt present by EMS with contractions. Pt states that she is unable to give urine sample at this time due to her level of discomfort.

## 2018-02-07 NOTE — MAU Provider Note (Signed)
Chief Complaint:  Contractions   None     HPI: [name] is a 32 y.o. [id number] at [redacted]w[redacted]d who presents to maternity admissions reporting constant pelvic pain so painful that she cannot walk. She reports that her husband is having to help her walk for the last 2 days. The pain is low in the front of her abdomen and is increased with walking or changing positions in bed.  She has not tried anything for the pain. She is drinking water and has been resting in bed due to the pain.  She was feeling less movement today than usual but is feeling normal movement in MAU.  There are no other associated symptoms.      HPI  Past Medical History: Past Medical History:  Diagnosis Date  . Anxiety   . Asthma    inhaler PRN-hasn't used inhaler in months  . Attention deficit hyperactivity disorder   . Bipolar disorder [facility])    hospitalized as a teen for suicidal ideations and cutting  . Depression   . Gastritis   . GERD (gastroesophageal reflux disease)   . HPV (human papilloma virus) anogenital infection   . Hx of varicella   . Mild preeclampsia 02/09/2016  . Vaginal Pap smear, abnormal     Past obstetric history: OB History  Gravida Para Term Preterm AB Living  3 2 1 1  0 2  SAB TAB Ectopic Multiple Live Births  0     0 2    # Outcome Date GA Lbr Len/2nd Weight Sex Delivery Anes PTL Lv  3 Current           2 Preterm 02/03/17 [redacted]w[redacted]d  7 lb 7.9 oz (3.4 kg) M Vag-Spont EPI  LIV  1 Term 02/11/16 [redacted]w[redacted]d / 01:14 8 lb 2.6 oz (3.702 kg) F Vag-Spont EPI  LIV      Past Surgical History: Past Surgical History:  Procedure Laterality Date  . EYE MUSCLE SURGERY Bilateral    07/29/12  . MOUTH SURGERY    . PLANTAR FASCIA SURGERY    . WISDOM TOOTH EXTRACTION      Family History: Family History  Problem Relation Age of Onset  . Depression Mother   . Colon cancer Mother   . Colon polyps Mother   . Depression Father   . Depression Brother   . Liver disease Unknown   . Kidney disease Unknown      Social History: Social History   Tobacco Use  . Smoking status: Never Smoker  . Smokeless tobacco: Never Used  Substance Use Topics  . Alcohol use: No    Alcohol/week: 0.0 oz  . Drug use: No    Allergies:  Allergies  Allergen Reactions  . Depakote [Divalproex Sodium] Other (See Comments)    Pt states that this medication makes her blood levels toxic.    [name] Methylphenidate Derivatives Other (See Comments)    Reaction:  Depression and anger   . Neurontin [Gabapentin] Other (See Comments)    Reaction:  Dizziness   . Prozac [Fluoxetine Hcl] Other (See Comments)    Reaction:  Anger     Meds:  Medications Prior to Admission  Medication Sig Dispense Refill Last Dose  . Prenatal Vit-Fe Fumarate-FA (PRENATAL MULTIVITAMIN) TABS tablet Take 1 tablet at bedtime by mouth.    02/06/2018 at Unknown time  . aspirin EC 81 MG tablet Take 1 tablet (81 mg total) by mouth daily. Take after 12 weeks for prevention of preeclampsia later in pregnancy (  Patient not taking: Reported on 12/21/2017) 300 tablet 2 Not Taking at Unknown time    ROS:  Review of Systems  Constitutional: Negative for chills, fatigue and fever.  Eyes: Negative for visual disturbance.  Respiratory: Negative for shortness of breath.   Cardiovascular: Negative for chest pain.  Gastrointestinal: Positive for abdominal pain. Negative for nausea and vomiting.  Genitourinary: Positive for pelvic pain. Negative for difficulty urinating, dysuria, flank pain, vaginal bleeding, vaginal discharge and vaginal pain.  Neurological: Negative for dizziness and headaches.  Psychiatric/Behavioral: Negative.      I have reviewed patient's Past Medical Hx, Surgical Hx, Family Hx, Social Hx, medications and allergies.   Physical Exam  No data found. Constitutional: Well-developed, well-nourished female in no acute distress.  Cardiovascular: normal rate Respiratory: normal effort GI: Abd soft, non-tender, gravid appropriate for  gestational age.  MS: Extremities nontender, no edema, normal ROM Neurologic: Alert and oriented x 4.  GU: Neg CVAT.  PELVIC EXAM: Cervix pink, visually closed, without lesion, scant white creamy discharge, vaginal walls and external genitalia normal Bimanual exam: Cervix 0/long/high, firm, anterior, neg CMT, uterus nontender, nonenlarged, adnexa without tenderness, enlargement, or mass  Dilation: 1.5 Effacement (%): Thick Cervical Position: Middle Station: -3 Exam by:: L Leftwich-Kirby CNM  FHT:  Baseline 135 , moderate variability, accelerations present, no decelerations Contractions: q 30 mins, mild to palpation   Labs: No results found for this or any previous visit (from the past 24 hour(s)).    Imaging:    MAU Course/MDM: I have ordered labs and reviewed results.  NST reviewed and reactive No evidence of preterm labor today Pain c/w pubic symphysis pain due to pregnancy Rest/ice/pregnancy support belt/Tylenol/Flexeril PRN Rx for pregnancy support belt and Flexeril Offered dose of Flexeril in MAU but pt called for ride home and her sister arrived so she declined the medication here and will pick up Rx tomorrow F/U with [facility] as scheduled Pt discharge with strict labor precautions.   Assessment: 1. Pain in symphysis pubis during pregnancy   2. Supervision of high risk pregnancy due to social problems in third trimester   3. Pelvic pain affecting pregnancy in third trimester, antepartum     Plan: Discharge home Labor precautions and fetal kick counts Follow-up Information    [facility]. Schedule an appointment as soon as possible for a visit.   Why:  As soon as possible. Return to MAU with signs of labor or emergencies. Contact information: [place] [address] [phone]         Allergies as of 02/07/2018      Reactions   Depakote [divalproex Sodium] Other (See Comments)   Pt states that this  medication makes her blood levels toxic.     Methylphenidate Derivatives Other (See Comments)   Reaction:  Depression and anger    Neurontin [gabapentin] Other (See Comments)   Reaction:  Dizziness    Prozac [fluoxetine Hcl] Other (See Comments)   Reaction:  Anger       Medication List    STOP taking these medications   aspirin EC 81 MG tablet     TAKE these medications   COMFORT FIT MATERNITY SUPP MED Misc 1 Device by Does not apply route daily.   cyclobenzaprine 10 MG tablet Commonly known as:  FLEXERIL Take 0.5-1 tablets (5-10 mg total) by mouth 3 (three) times daily as needed for muscle spasms.   prenatal multivitamin Tabs tablet Take 1 tablet at bedtime by  mouthFatima Blank Certified Nurse-Midwife 02/07/2018 3:05 AM

## 2018-02-11 ENCOUNTER — Ambulatory Visit (INDEPENDENT_AMBULATORY_CARE_PROVIDER_SITE_OTHER): Payer: Medicare Other | Admitting: Certified Nurse Midwife

## 2018-02-11 ENCOUNTER — Encounter: Payer: Self-pay | Admitting: Certified Nurse Midwife

## 2018-02-11 ENCOUNTER — Other Ambulatory Visit: Payer: Medicare Other

## 2018-02-11 ENCOUNTER — Inpatient Hospital Stay (HOSPITAL_COMMUNITY)
Admission: AD | Admit: 2018-02-11 | Discharge: 2018-02-11 | Disposition: A | Discharge status: Home / Self Care | Payer: Medicare Other | Source: Ambulatory Visit | Attending: Obstetrics & Gynecology | Admitting: Obstetrics & Gynecology

## 2018-02-11 ENCOUNTER — Encounter (HOSPITAL_COMMUNITY): Payer: Self-pay

## 2018-02-11 ENCOUNTER — Other Ambulatory Visit (HOSPITAL_COMMUNITY)
Admission: RE | Admit: 2018-02-11 | Discharge: 2018-02-11 | Disposition: A | Discharge status: Home / Self Care | Payer: Medicare Other | Source: Ambulatory Visit | Attending: Certified Nurse Midwife | Admitting: Certified Nurse Midwife

## 2018-02-11 VITALS — BP 132/92 | HR 99 | Wt 200.6 lb

## 2018-02-11 DIAGNOSIS — R030 Elevated blood-pressure reading, without diagnosis of hypertension: Secondary | ICD-10-CM | POA: Diagnosis not present

## 2018-02-11 DIAGNOSIS — O26893 Other specified pregnancy related conditions, third trimester: Secondary | ICD-10-CM | POA: Diagnosis not present

## 2018-02-11 DIAGNOSIS — O0973 Supervision of high risk pregnancy due to social problems, third trimester: Secondary | ICD-10-CM | POA: Diagnosis present

## 2018-02-11 DIAGNOSIS — Z3A36 36 weeks gestation of pregnancy: Secondary | ICD-10-CM | POA: Insufficient documentation

## 2018-02-11 DIAGNOSIS — O99213 Obesity complicating pregnancy, third trimester: Secondary | ICD-10-CM | POA: Diagnosis not present

## 2018-02-11 DIAGNOSIS — F419 Anxiety disorder, unspecified: Secondary | ICD-10-CM | POA: Insufficient documentation

## 2018-02-11 DIAGNOSIS — O99343 Other mental disorders complicating pregnancy, third trimester: Secondary | ICD-10-CM | POA: Diagnosis not present

## 2018-02-11 DIAGNOSIS — F319 Bipolar disorder, unspecified: Secondary | ICD-10-CM | POA: Diagnosis not present

## 2018-02-11 DIAGNOSIS — E669 Obesity, unspecified: Secondary | ICD-10-CM | POA: Insufficient documentation

## 2018-02-11 DIAGNOSIS — O133 Gestational [pregnancy-induced] hypertension without significant proteinuria, third trimester: Secondary | ICD-10-CM

## 2018-02-11 DIAGNOSIS — O479 False labor, unspecified: Secondary | ICD-10-CM

## 2018-02-11 DIAGNOSIS — D696 Thrombocytopenia, unspecified: Secondary | ICD-10-CM

## 2018-02-11 DIAGNOSIS — Z131 Encounter for screening for diabetes mellitus: Secondary | ICD-10-CM | POA: Diagnosis not present

## 2018-02-11 DIAGNOSIS — O163 Unspecified maternal hypertension, third trimester: Secondary | ICD-10-CM

## 2018-02-11 DIAGNOSIS — O99119 Other diseases of the blood and blood-forming organs and certain disorders involving the immune mechanism complicating pregnancy, unspecified trimester: Secondary | ICD-10-CM

## 2018-02-11 DIAGNOSIS — O09299 Supervision of pregnancy with other poor reproductive or obstetric history, unspecified trimester: Secondary | ICD-10-CM

## 2018-02-11 DIAGNOSIS — Z8632 Personal history of gestational diabetes: Secondary | ICD-10-CM

## 2018-02-11 DIAGNOSIS — Z1329 Encounter for screening for other suspected endocrine disorder: Secondary | ICD-10-CM | POA: Diagnosis not present

## 2018-02-11 DIAGNOSIS — O4703 False labor before 37 completed weeks of gestation, third trimester: Secondary | ICD-10-CM

## 2018-02-11 DIAGNOSIS — O139 Gestational [pregnancy-induced] hypertension without significant proteinuria, unspecified trimester: Secondary | ICD-10-CM

## 2018-02-11 LAB — COMPREHENSIVE METABOLIC PANEL
ALT: 27 U/L (ref 14–54)
AST: 21 U/L (ref 15–41)
Albumin: 2.8 g/dL — ABNORMAL LOW (ref 3.5–5.0)
Alkaline Phosphatase: 184 U/L — ABNORMAL HIGH (ref 38–126)
Anion gap: 8 (ref 5–15)
BUN: 13 mg/dL (ref 6–20)
CO2: 21 mmol/L — ABNORMAL LOW (ref 22–32)
Calcium: 8.7 mg/dL — ABNORMAL LOW (ref 8.9–10.3)
Chloride: 104 mmol/L (ref 101–111)
Creatinine, Ser: 0.66 mg/dL (ref 0.44–1.00)
GFR calc Af Amer: >60 mL/min (ref >60)
GFR calc non Af Amer: >60 mL/min (ref >60)
Glucose, Bld: 98 mg/dL (ref 65–99)
Potassium: 4.3 mmol/L (ref 3.5–5.1)
Sodium: 133 mmol/L — ABNORMAL LOW (ref 135–145)
Total Bilirubin: 0.9 mg/dL (ref 0.3–1.2)
Total Protein: 6.6 g/dL (ref 6.5–8.1)

## 2018-02-11 LAB — URINALYSIS, ROUTINE W REFLEX MICROSCOPIC
Bilirubin Urine: NEGATIVE
Glucose, UA: NEGATIVE mg/dL
Hgb urine dipstick: NEGATIVE
Ketones, ur: NEGATIVE mg/dL
Nitrite: NEGATIVE
Protein, ur: 100 mg/dL — AB
Specific Gravity, Urine: 1.025 (ref 1.005–1.030)
pH: 5 (ref 5.0–8.0)

## 2018-02-11 LAB — CBC
HCT: 33.5 % — ABNORMAL LOW (ref 36.0–46.0)
Hemoglobin: 11.1 g/dL — ABNORMAL LOW (ref 12.0–15.0)
MCH: 29.2 pg (ref 26.0–34.0)
MCHC: 33.1 g/dL (ref 30.0–36.0)
MCV: 88.2 fL (ref 78.0–100.0)
Platelets: 144 10*3/uL — ABNORMAL LOW (ref 150–400)
RBC: 3.8 MIL/uL — ABNORMAL LOW (ref 3.87–5.11)
RDW: 14.1 % (ref 11.5–15.5)
WBC: 6.6 10*3/uL (ref 4.0–10.5)

## 2018-02-11 LAB — PROTEIN / CREATININE RATIO, URINE
Creatinine, Urine: 211 mg/dL
Protein Creatinine Ratio: 0.73 mg/mg{Cre} — ABNORMAL HIGH (ref 0.00–0.15)
Total Protein, Urine: 153 mg/dL

## 2018-02-11 MED ORDER — ACETAMINOPHEN 500 MG PO TABS
1000.0000 mg | ORAL_TABLET | Freq: Once | ORAL | Status: AC
  Administered 2018-02-11: 1000 mg via ORAL
  Filled 2018-02-11: qty 2

## 2018-02-11 NOTE — Progress Notes (Signed)
Pt c/o ctx Q6 min since yesterday. She reports being seen at [facility] numerous times in the past few weeks, as well as, eclampsia with 2 previous pregnancies.

## 2018-02-11 NOTE — MAU Provider Note (Signed)
History  CSN: [id number] Arrival date and time: 02/11/18 0126  First Provider Initiated Contact with Patient 02/11/18 [phone]     Chief Complaint  Patient presents with  . Contractions    HPI: [name] is a 32 y.o. [id number] with IUP at [redacted]w[redacted]d who presents to maternity admissions for contractions. Denies LOF or bloody show. BP noted to be slightly elevated (DBP 90s). She endorses headache, but reports this is mild, on/off for 3 days. Denies blurry vision, RUQ/epigastric pain, N/V.   She receives [facility]. Pregnancy complicated by bipolar disorder, anxiety, obesity, intellectual disability, hx of pre-eclampsia, SIP, and multiple MAU visits for threatened PTL.   OB History  Gravida Para Term Preterm AB Living  3 2 1 1  0 2  SAB TAB Ectopic Multiple Live Births  0     0 2    # Outcome Date GA Lbr Len/2nd Weight Sex Delivery Anes PTL Lv  3 Current           2 Preterm 02/03/17 [redacted]w[redacted]d  7 lb 7.9 oz (3.4 kg) M Vag-Spont EPI  LIV  1 Term 02/11/16 [redacted]w[redacted]d / 01:14 8 lb 2.6 oz (3.702 kg) F Vag-Spont EPI  LIV     Past Medical History:  Diagnosis Date  . Anxiety   . Asthma    inhaler PRN-hasn't used inhaler in months  . Attention deficit hyperactivity disorder   . Bipolar disorder [facility])    hospitalized as a teen for suicidal ideations and cutting  . Depression   . Gastritis   . GERD (gastroesophageal reflux disease)   . HPV (human papilloma virus) anogenital infection   . Hx of varicella   . Mild preeclampsia 02/09/2016  . Vaginal Pap smear, abnormal    Past Surgical History:  Procedure Laterality Date  . EYE MUSCLE SURGERY Bilateral    07/29/12  . MOUTH SURGERY    . PLANTAR FASCIA SURGERY    . WISDOM TOOTH EXTRACTION     Family History  Problem Relation Age of Onset  . Depression Mother   . Colon cancer Mother   . Colon polyps Mother   . Depression Father   . Depression Brother   . Liver disease Unknown   . Kidney disease Unknown    Social History   Socioeconomic  History  . Marital status: Married    Spouse name: Not on file  . Number of children: 0  . Years of education: Not on file  . Highest education level: Not on file  Social Needs  . Financial resource strain: Not on file  . Food insecurity - worry: Not on file  . Food insecurity - inability: Not on file  . Transportation needs - medical: Not on file  . Transportation needs - non-medical: Not on file  Occupational History  . Not on file  Tobacco Use  . Smoking status: Never Smoker  . Smokeless tobacco: Never Used  Substance and Sexual Activity  . Alcohol use: No    Alcohol/week: 0.0 oz  . Drug use: No  . Sexual activity: Yes    Birth control/protection: None    Comment: last sex late January 2019  Other Topics Concern  . Not on file  Social History Narrative  . Not on file   Allergies  Allergen Reactions  . Depakote [Divalproex Sodium] Other (See Comments)    Pt states that this medication makes her blood levels toxic.    [name] Methylphenidate Derivatives Other (See Comments)  Reaction:  Depression and anger   . Neurontin [Gabapentin] Other (See Comments)    Reaction:  Dizziness   . Prozac [Fluoxetine Hcl] Other (See Comments)    Reaction:  Anger     Medications Prior to Admission  Medication Sig Dispense Refill Last Dose  . cyclobenzaprine (FLEXERIL) 10 MG tablet Take 0.5-1 tablets (5-10 mg total) by mouth 3 (three) times daily as needed for muscle spasms. 30 tablet 0   . Elastic Bandages & Supports (COMFORT FIT MATERNITY SUPP MED) MISC 1 Device by Does not apply route daily. 1 each 0   . Prenatal Vit-Fe Fumarate-FA (PRENATAL MULTIVITAMIN) TABS tablet Take 1 tablet at bedtime by mouth.    02/06/2018 at Unknown time    I have reviewed patient's Past Medical Hx, Surgical Hx, Family Hx, Social Hx, medications and allergies.   Review of Systems: Negative except for what is mentioned in HPI.  Physical Exam   Blood pressure 114/67, pulse 79, temperature 98.3 F (36.8 C),  resp. rate 19, height 5\' 3"  (1.6 m), weight 201 lb 12 oz (91.5 kg), SpO2 99 %, unknown if currently breastfeeding.  Constitutional: Well-developed, well-nourished female in no acute distress.  HENT: [place]/AT, normal oropharynx mucosa. MMM Eyes: normal conjunctivae, no scleral icterus Cardiovascular: normal rate, regular rhythm Respiratory: normal effort, no respiratory distress GI: Abd soft, non-tender, gravid appropriate for gestational age.   GU: Neg CVAT. SVE: 2cm/thick/bollatable MSK: Extremities nontender, no edema Neurologic: Alert and oriented x 4. Psych: Normal mood and affect Skin: warm and dry   FHT:  Baseline 120 , moderate variability, accelerations present, no decelerations Toco: irregular ctx  MAU Course/MDM:   Nursing notes and VS reviewed. Patient seen and examined, as noted above.   --BPs reviewed: Patient Vitals for the past 4 hrs:  BP Temp Pulse Resp SpO2 Height Weight  02/11/18 0345 114/67 -- 79 -- -- -- --  02/11/18 0330 122/70 -- 77 -- -- -- --  02/11/18 0315 123/71 -- 75 -- -- -- --  02/11/18 0300 120/77 -- 67 -- -- -- --  02/11/18 0245 120/78 -- 75 -- -- -- --  02/11/18 0230 (!) 126/92 -- 90 -- -- -- --  02/11/18 0215 124/81 -- 80 -- -- -- --  02/11/18 0203 (!) 123/97 -- (!) 105 -- -- -- --  02/11/18 0150 (!) 121/91 98.3 F (36.8 C) (!) 114 19 99 % -- --  02/11/18 0143 -- -- -- -- -- 5\' 3"  (1.6 m) 201 lb 12 oz (91.5 kg)     --CBC, CMP, UPC ordered and reviewed: Results for orders placed or performed during the hospital encounter of 02/11/18  CBC  Result Value Ref Range   WBC 6.6 4.0 - 10.5 K/uL   RBC 3.80 (L) 3.87 - 5.11 MIL/uL   Hemoglobin 11.1 (L) 12.0 - 15.0 g/dL   HCT 33.5 (L) 36.0 - 46.0 %   MCV 88.2 78.0 - 100.0 fL   MCH 29.2 26.0 - 34.0 pg   MCHC 33.1 30.0 - 36.0 g/dL   RDW 14.1 11.5 - 15.5 %   Platelets 144 (L) 150 - 400 K/uL  Comprehensive metabolic panel  Result Value Ref Range   Sodium 133 (L) 135 - 145 mmol/L   Potassium 4.3 3.5  - 5.1 mmol/L   Chloride 104 101 - 111 mmol/L   CO2 21 (L) 22 - 32 mmol/L   Glucose, Bld 98 65 - 99 mg/dL   BUN 13 6 - 20 mg/dL  Creatinine, Ser 0.66 0.44 - 1.00 mg/dL   Calcium 8.7 (L) 8.9 - 10.3 mg/dL   Total Protein 6.6 6.5 - 8.1 g/dL   Albumin 2.8 (L) 3.5 - 5.0 g/dL   AST 21 15 - 41 U/L   ALT 27 14 - 54 U/L   Alkaline Phosphatase 184 (H) 38 - 126 U/L   Total Bilirubin 0.9 0.3 - 1.2 mg/dL   GFR calc non Af Amer >60 >60 mL/min   GFR calc Af Amer >60 >60 mL/min   Anion gap 8 5 - 15  Protein / creatinine ratio, urine  Result Value Ref Range   Creatinine, Urine 211.00 mg/dL   Total Protein, Urine 153 mg/dL   Protein Creatinine Ratio 0.73 (H) 0.00 - 0.15 mg/mg[Cre]  Urinalysis, Routine w reflex microscopic  Result Value Ref Range   Color, Urine YELLOW YELLOW   APPearance HAZY (A) CLEAR   Specific Gravity, Urine 1.025 1.005 - 1.030   pH 5.0 5.0 - 8.0   Glucose, UA NEGATIVE NEGATIVE mg/dL   Hgb urine dipstick NEGATIVE NEGATIVE   Bilirubin Urine NEGATIVE NEGATIVE   Ketones, ur NEGATIVE NEGATIVE mg/dL   Protein, ur 100 (A) NEGATIVE mg/dL   Nitrite NEGATIVE NEGATIVE   Leukocytes, UA TRACE (A) NEGATIVE   RBC / HPF 0-5 0 - 5 RBC/hpf   WBC, UA 6-30 0 - 5 WBC/hpf   Bacteria, UA RARE (A) NONE SEEN   Squamous Epithelial / LPF 6-30 (A) NONE SEEN   Mucus PRESENT    --Tylenol given for headache with good improvement.   --UPC elevated at 0.73, but recent BPs wnl. She had 3 DBP > 90 w/in 1 hour, now Bps normal.  --Lab show Ptl slightly decreased, 144, but she seems to have gestational thrombocytopenia (Ptl have been as low as 110 this pregnancy)   Discussed results with patient. Dicussed that work up is not diagnostic of PIH today, but she will need close follow up to monitor Bps. She enquired about IOL, and I explained that she would need IOL at 37 weeks if she did have PIH (if BP were elevated at follow up visits), or anything else changes. Discussed importance of keeping   --SVE  rechecked prior to discharge and unchanged.   Assessment and Plan  Assessment: 1. False labor   2. Elevated blood pressure affecting pregnancy in third trimester, antepartum   3. Elevated blood pressure reading without diagnosis of hypertension     Plan: --Discharge home in stable condition.  --Discussed return precautions, including severe headache unrelieved with Tylenol, visual changes, persistent RUQ/epigastric pain --Keep appt this afternoon for PNV.   Darwyn Ponzo, Jenne Pane, MD 02/11/2018 4:01 AM  Future Appointments  Date Time Provider [place]  02/11/2018  1:00 PM [facility] None  02/11/2018  1:00 PM Denney, Rachelle A, CNM [facility] None

## 2018-02-11 NOTE — Discharge Instructions (Signed)
Braxton Hicks Contractions °Contractions of the uterus can occur throughout pregnancy, but they are not always a sign that you are in labor. You may have practice contractions called Braxton Hicks contractions. These false labor contractions are sometimes confused with true labor. °What are Braxton Hicks contractions? °Braxton Hicks contractions are tightening movements that occur in the muscles of the uterus before labor. Unlike true labor contractions, these contractions do not result in opening (dilation) and thinning of the cervix. Toward the end of pregnancy (32-34 weeks), Braxton Hicks contractions can happen more often and may become stronger. These contractions are sometimes difficult to tell apart from true labor because they can be very uncomfortable. You should not feel embarrassed if you go to the hospital with false labor. °Sometimes, the only way to tell if you are in true labor is for your health care provider to look for changes in the cervix. The health care provider will do a physical exam and may monitor your contractions. If you are not in true labor, the exam should show that your cervix is not dilating and your water has not broken. °If there are other health problems associated with your pregnancy, it is completely safe for you to be sent home with false labor. You may continue to have Braxton Hicks contractions until you go into true labor. °How to tell the difference between true labor and false labor °True labor °· Contractions last 30-70 seconds. °· Contractions become very regular. °· Discomfort is usually felt in the top of the uterus, and it spreads to the lower abdomen and low back. °· Contractions do not go away with walking. °· Contractions usually become more intense and increase in frequency. °· The cervix dilates and gets thinner. °False labor °· Contractions are usually shorter and not as strong as true labor contractions. °· Contractions are usually irregular. °· Contractions  are often felt in the front of the lower abdomen and in the groin. °· Contractions may go away when you walk around or change positions while lying down. °· Contractions get weaker and are shorter-lasting as time goes on. °· The cervix usually does not dilate or become thin. °Follow these instructions at home: °· Take over-the-counter and prescription medicines only as told by your health care provider. °· Keep up with your usual exercises and follow other instructions from your health care provider. °· Eat and drink lightly if you think you are going into labor. °· If Braxton Hicks contractions are making you uncomfortable: °? Change your position from lying down or resting to walking, or change from walking to resting. °? Sit and rest in a tub of warm water. °? Drink enough fluid to keep your urine pale yellow. Dehydration may cause these contractions. °? Do slow and deep breathing several times an hour. °· Keep all follow-up prenatal visits as told by your health care provider. This is important. °Contact a health care provider if: °· You have a fever. °· You have continuous pain in your abdomen. °Get help right away if: °· Your contractions become stronger, more regular, and closer together. °· You have fluid leaking or gushing from your vagina. °· You pass blood-tinged mucus (bloody show). °· You have bleeding from your vagina. °· You have low back pain that you never had before. °· You feel your baby’s head pushing down and causing pelvic pressure. °· Your baby is not moving inside you as much as it used to. °Summary °· Contractions that occur before labor are called Braxton   Hicks contractions, false labor, or practice contractions. °· Braxton Hicks contractions are usually shorter, weaker, farther apart, and less regular than true labor contractions. True labor contractions usually become progressively stronger and regular and they become more frequent. °· Manage discomfort from Braxton Hicks contractions by  changing position, resting in a warm bath, drinking plenty of water, or practicing deep breathing. °This information is not intended to replace advice given to you by your health care provider. Make sure you discuss any questions you have with your health care provider. °Document Released: 04/16/2017 Document Revised: 04/16/2017 Document Reviewed: 04/16/2017 °Elsevier Interactive Patient Education © 2018 [employer] ° °
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
## 2018-02-12 ENCOUNTER — Other Ambulatory Visit: Payer: Self-pay | Admitting: Advanced Practice Midwife

## 2018-02-12 ENCOUNTER — Encounter (HOSPITAL_COMMUNITY): Payer: Self-pay | Admitting: *Deleted

## 2018-02-12 ENCOUNTER — Telehealth (HOSPITAL_COMMUNITY): Payer: Self-pay | Admitting: *Deleted

## 2018-02-12 LAB — CERVICOVAGINAL ANCILLARY ONLY
Bacterial vaginitis: NEGATIVE
Candida vaginitis: NEGATIVE
Chlamydia: NEGATIVE
Neisseria Gonorrhea: NEGATIVE
Trichomonas: NEGATIVE

## 2018-02-12 NOTE — Telephone Encounter (Signed)
Preadmission screen  

## 2018-02-12 NOTE — Addendum Note (Signed)
Addended by: Kandis Cocking A on: 02/12/2018 08:29 AM   Modules accepted: Orders

## 2018-02-12 NOTE — Addendum Note (Signed)
Addended by: Kandis Cocking A on: 02/12/2018 08:14 AM   Modules accepted: Orders, SmartSet

## 2018-02-13 ENCOUNTER — Inpatient Hospital Stay (HOSPITAL_COMMUNITY)
Admission: AD | Admit: 2018-02-13 | Discharge: 2018-02-13 | Disposition: A | Discharge status: Home / Self Care | Payer: Medicare Other | Source: Ambulatory Visit | Attending: Family Medicine | Admitting: Family Medicine

## 2018-02-13 ENCOUNTER — Inpatient Hospital Stay (HOSPITAL_COMMUNITY)
Admission: AD | Admit: 2018-02-13 | Discharge: 2018-02-13 | Disposition: A | Discharge status: Home / Self Care | Payer: Medicare Other | Source: Ambulatory Visit | Attending: Obstetrics and Gynecology | Admitting: Obstetrics and Gynecology

## 2018-02-13 ENCOUNTER — Encounter (HOSPITAL_COMMUNITY): Payer: Self-pay

## 2018-02-13 ENCOUNTER — Encounter (HOSPITAL_COMMUNITY): Payer: Self-pay | Admitting: *Deleted

## 2018-02-13 DIAGNOSIS — O4703 False labor before 37 completed weeks of gestation, third trimester: Secondary | ICD-10-CM

## 2018-02-13 DIAGNOSIS — O26893 Other specified pregnancy related conditions, third trimester: Secondary | ICD-10-CM | POA: Insufficient documentation

## 2018-02-13 DIAGNOSIS — Z3A37 37 weeks gestation of pregnancy: Secondary | ICD-10-CM | POA: Insufficient documentation

## 2018-02-13 DIAGNOSIS — O0973 Supervision of high risk pregnancy due to social problems, third trimester: Secondary | ICD-10-CM

## 2018-02-13 DIAGNOSIS — O479 False labor, unspecified: Secondary | ICD-10-CM

## 2018-02-13 LAB — URINALYSIS, ROUTINE W REFLEX MICROSCOPIC
Bilirubin Urine: NEGATIVE
Glucose, UA: NEGATIVE mg/dL
Hgb urine dipstick: NEGATIVE
Ketones, ur: NEGATIVE mg/dL
Nitrite: NEGATIVE
Protein, ur: 100 mg/dL — AB
Specific Gravity, Urine: 1.021 (ref 1.005–1.030)
pH: 6 (ref 5.0–8.0)

## 2018-02-13 LAB — STREP GP B NAA: Strep Gp B NAA: NEGATIVE

## 2018-02-13 NOTE — MAU Note (Signed)
I have communicated with [name], CNM and reviewed vital signs:  Vitals:   02/13/18 2017  BP: 116/82  Pulse: (!) 106  Resp: 18  Temp: 98.9 F (37.2 C)    Vaginal exam:  Dilation: 2 Effacement (%): 30 Cervical Position: Posterior Station: Ballotable Presentation: Vertex Exam by:: K.Wille Aubuchon,RN,   Also reviewed contraction pattern and that non-stress test is reactive.  It has been documented that patient is not  contracting on the monitor and pt not report any contractions since she has been here. There has been no cervical change since her exam earlier this morning not indicating active labor.  Patient denies any other complaints.  Based on this report provider has given order for discharge.  A discharge order and diagnosis entered by a provider.   Labor discharge instructions reviewed with patient.

## 2018-02-13 NOTE — MAU Note (Signed)
Pt reports having ctx off and on for a few hours that are 4-5 min apart.C/o some bloody show and mucus discharge.   fetal movement  Not as much this evening.

## 2018-02-13 NOTE — Discharge Instructions (Signed)
Hypertension During Pregnancy °Hypertension is also called high blood pressure. High blood pressure means that the force of your blood moving in your body is too strong. When you are pregnant, this condition should be watched carefully. It can cause problems for you and your baby. °Follow these instructions at home: °Eating and drinking °· Drink enough fluid to keep your pee (urine) clear or pale yellow. °· Eat healthy foods that are low in salt (sodium). °? Do not add salt to your food. °? Check labels on foods and drinks to see much salt is in them. Look on the label where you see "Sodium." °Lifestyle °· Do not use any products that contain nicotine or tobacco, such as cigarettes and e-cigarettes. If you need help quitting, ask your doctor. °· Do not use alcohol. °· Avoid caffeine. °· Avoid stress. Rest and get plenty of sleep. °General instructions °· Take over-the-counter and prescription medicines only as told by your doctor. °· While lying down, lie on your left side. This keeps pressure off your baby. °· While sitting or lying down, raise (elevate) your feet. Try putting some pillows under your lower legs. °· Exercise regularly. Ask your doctor what kinds of exercise are best for you. °· Keep all prenatal and follow-up visits as told by your doctor. This is important. °Contact a doctor if: °· You have symptoms that your doctor told you to watch for, such as: °? Fever. °? Throwing up (vomiting). °? Headache. °Get help right away if: °· You have very bad pain in your belly (abdomen). °· You are throwing up, and this does not get better with treatment. °· You suddenly get swelling in your hands, ankles, or face. °· You gain 4 lb (1.8 kg) or more in 1 week. °· You get bleeding from your vagina. °· You have blood in your pee. °· You do not feel your baby moving as much as normal. °· You have a change in vision. °· You have muscle twitching or sudden tightening (spasms). °· You have trouble breathing. °· Your lips  or fingernails turn blue. °This information is not intended to replace advice given to you by your health care provider. Make sure you discuss any questions you have with your health care provider. °Document Released: 01/03/2011 Document Revised: 08/12/2016 Document Reviewed: 08/12/2016 °Elsevier Interactive Patient Education © 2018 Elsevier Inc. ° °

## 2018-02-13 NOTE — MAU Note (Signed)
Ctxs for 3-4 nights. Lost mucous plug this am.

## 2018-02-13 NOTE — MAU Note (Signed)
I have communicated with Derrill Memo, CNM and reviewed vital signs:  Vitals:   02/13/18 0510 02/13/18 0516  BP: 107/72 113/72  Pulse: (!) 55 (!) 52  Resp:    Temp:      Vaginal exam:  Dilation: 2 Effacement (%): 30 Station: Ballotable Presentation: Vertex Exam by:: TLYTLE RN ,   Also reviewed contraction pattern and that non-stress test is reactive.  It has been documented that patient is not contracting and has made no cervical change since being checked in the office on 2/28,  not indicating active labor.  Patient denies any other complaints.  Based on this report provider has given order for discharge.  A discharge order and diagnosis entered by a provider.   Labor discharge instructions reviewed with patient.

## 2018-02-15 ENCOUNTER — Other Ambulatory Visit: Payer: Self-pay

## 2018-02-15 ENCOUNTER — Other Ambulatory Visit: Payer: Self-pay | Admitting: Certified Nurse Midwife

## 2018-02-15 ENCOUNTER — Encounter (HOSPITAL_COMMUNITY): Payer: Self-pay | Admitting: *Deleted

## 2018-02-15 ENCOUNTER — Ambulatory Visit (HOSPITAL_COMMUNITY)
Admission: RE | Admit: 2018-02-15 | Discharge: 2018-02-15 | Disposition: A | Discharge status: Home / Self Care | Payer: Medicare Other | Source: Ambulatory Visit | Attending: Certified Nurse Midwife | Admitting: Certified Nurse Midwife

## 2018-02-15 ENCOUNTER — Inpatient Hospital Stay (HOSPITAL_COMMUNITY)
Admission: AD | Admit: 2018-02-15 | Discharge: 2018-02-15 | Disposition: A | Discharge status: Home / Self Care | Payer: Medicare Other | Source: Ambulatory Visit | Attending: Obstetrics and Gynecology | Admitting: Obstetrics and Gynecology

## 2018-02-15 DIAGNOSIS — F419 Anxiety disorder, unspecified: Secondary | ICD-10-CM | POA: Insufficient documentation

## 2018-02-15 DIAGNOSIS — O09293 Supervision of pregnancy with other poor reproductive or obstetric history, third trimester: Secondary | ICD-10-CM

## 2018-02-15 DIAGNOSIS — O99613 Diseases of the digestive system complicating pregnancy, third trimester: Secondary | ICD-10-CM | POA: Insufficient documentation

## 2018-02-15 DIAGNOSIS — O479 False labor, unspecified: Secondary | ICD-10-CM | POA: Diagnosis present

## 2018-02-15 DIAGNOSIS — O1493 Unspecified pre-eclampsia, third trimester: Secondary | ICD-10-CM

## 2018-02-15 DIAGNOSIS — O0973 Supervision of high risk pregnancy due to social problems, third trimester: Secondary | ICD-10-CM

## 2018-02-15 DIAGNOSIS — O99513 Diseases of the respiratory system complicating pregnancy, third trimester: Secondary | ICD-10-CM

## 2018-02-15 DIAGNOSIS — O09893 Supervision of other high risk pregnancies, third trimester: Secondary | ICD-10-CM

## 2018-02-15 DIAGNOSIS — O4703 False labor before 37 completed weeks of gestation, third trimester: Secondary | ICD-10-CM

## 2018-02-15 DIAGNOSIS — Z8371 Family history of colonic polyps: Secondary | ICD-10-CM | POA: Insufficient documentation

## 2018-02-15 DIAGNOSIS — O99343 Other mental disorders complicating pregnancy, third trimester: Secondary | ICD-10-CM | POA: Insufficient documentation

## 2018-02-15 DIAGNOSIS — O163 Unspecified maternal hypertension, third trimester: Secondary | ICD-10-CM | POA: Diagnosis not present

## 2018-02-15 DIAGNOSIS — Z3689 Encounter for other specified antenatal screening: Secondary | ICD-10-CM | POA: Insufficient documentation

## 2018-02-15 DIAGNOSIS — O133 Gestational [pregnancy-induced] hypertension without significant proteinuria, third trimester: Secondary | ICD-10-CM

## 2018-02-15 DIAGNOSIS — J45909 Unspecified asthma, uncomplicated: Secondary | ICD-10-CM | POA: Insufficient documentation

## 2018-02-15 DIAGNOSIS — O3663X0 Maternal care for excessive fetal growth, third trimester, not applicable or unspecified: Secondary | ICD-10-CM

## 2018-02-15 DIAGNOSIS — O99213 Obesity complicating pregnancy, third trimester: Secondary | ICD-10-CM

## 2018-02-15 DIAGNOSIS — Z3A36 36 weeks gestation of pregnancy: Secondary | ICD-10-CM

## 2018-02-15 DIAGNOSIS — O09213 Supervision of pregnancy with history of pre-term labor, third trimester: Secondary | ICD-10-CM

## 2018-02-15 DIAGNOSIS — O3660X0 Maternal care for excessive fetal growth, unspecified trimester, not applicable or unspecified: Secondary | ICD-10-CM | POA: Insufficient documentation

## 2018-02-15 DIAGNOSIS — K219 Gastro-esophageal reflux disease without esophagitis: Secondary | ICD-10-CM | POA: Diagnosis not present

## 2018-02-15 LAB — OBSTETRIC PANEL, INCLUDING HIV
Antibody Screen: NEGATIVE
Basophils Absolute: 0 10*3/uL (ref 0.0–0.2)
Basos: 0 %
EOS (ABSOLUTE): 0 10*3/uL (ref 0.0–0.4)
Eos: 0 %
HIV Screen 4th Generation wRfx: NONREACTIVE
Hematocrit: 35.9 % (ref 34.0–46.6)
Hemoglobin: 12 g/dL (ref 11.1–15.9)
Hepatitis B Surface Ag: NEGATIVE
Immature Grans (Abs): 0.1 10*3/uL (ref 0.0–0.1)
Immature Granulocytes: 1 %
Lymphocytes Absolute: 1.7 10*3/uL (ref 0.7–3.1)
Lymphs: 24 %
MCH: 29.6 pg (ref 26.6–33.0)
MCHC: 33.4 g/dL (ref 31.5–35.7)
MCV: 88 fL (ref 79–97)
Monocytes Absolute: 0.5 10*3/uL (ref 0.1–0.9)
Monocytes: 6 %
Neutrophils Absolute: 5.1 10*3/uL (ref 1.4–7.0)
Neutrophils: 69 %
Platelets: 154 10*3/uL (ref 150–379)
RBC: 4.06 x10E6/uL (ref 3.77–5.28)
RDW: 14.7 % (ref 12.3–15.4)
RPR Ser Ql: NONREACTIVE
Rh Factor: POSITIVE
Rubella Antibodies, IGG: 1.82 index (ref >0.99)
WBC: 7.4 10*3/uL (ref 3.4–10.8)

## 2018-02-15 LAB — TSH: TSH: 1.82 u[IU]/mL (ref 0.450–4.500)

## 2018-02-15 LAB — COMPREHENSIVE METABOLIC PANEL
ALT: 19 U/L (ref 14–54)
AST: 21 U/L (ref 15–41)
Albumin: 3.1 g/dL — ABNORMAL LOW (ref 3.5–5.0)
Alkaline Phosphatase: 182 U/L — ABNORMAL HIGH (ref 38–126)
Anion gap: 8 (ref 5–15)
BUN: 9 mg/dL (ref 6–20)
CO2: 20 mmol/L — ABNORMAL LOW (ref 22–32)
Calcium: 8.5 mg/dL — ABNORMAL LOW (ref 8.9–10.3)
Chloride: 105 mmol/L (ref 101–111)
Creatinine, Ser: 0.75 mg/dL (ref 0.44–1.00)
GFR calc Af Amer: >60 mL/min (ref >60)
GFR calc non Af Amer: >60 mL/min (ref >60)
Glucose, Bld: 82 mg/dL (ref 65–99)
Potassium: 4.1 mmol/L (ref 3.5–5.1)
Sodium: 133 mmol/L — ABNORMAL LOW (ref 135–145)
Total Bilirubin: 0.9 mg/dL (ref 0.3–1.2)
Total Protein: 7.1 g/dL (ref 6.5–8.1)

## 2018-02-15 LAB — CBC
HCT: 34.5 % — ABNORMAL LOW (ref 36.0–46.0)
Hemoglobin: 11.4 g/dL — ABNORMAL LOW (ref 12.0–15.0)
MCH: 29.4 pg (ref 26.0–34.0)
MCHC: 33 g/dL (ref 30.0–36.0)
MCV: 88.9 fL (ref 78.0–100.0)
Platelets: 126 10*3/uL — ABNORMAL LOW (ref 150–400)
RBC: 3.88 MIL/uL (ref 3.87–5.11)
RDW: 14.2 % (ref 11.5–15.5)
WBC: 7.1 10*3/uL (ref 4.0–10.5)

## 2018-02-15 LAB — HEMOGLOBIN A1C
Est. average glucose Bld gHb Est-mCnc: 108 mg/dL
Hgb A1c MFr Bld: 5.4 % (ref 4.8–5.6)

## 2018-02-15 LAB — HEMOGLOBINOPATHY EVALUATION
HGB C: 0 %
HGB S: 0 %
HGB VARIANT: 0 %
Hemoglobin A2 Quantitation: 2.1 % (ref 1.8–3.2)
Hemoglobin F Quantitation: 0 % (ref 0.0–2.0)
Hgb A: 97.9 % (ref 96.4–98.8)

## 2018-02-15 LAB — PROTEIN / CREATININE RATIO, URINE
Creatinine, Urine: 254 mg/dL
Protein Creatinine Ratio: 0.39 mg/mg{Cre} — ABNORMAL HIGH (ref 0.00–0.15)
Total Protein, Urine: 100 mg/dL

## 2018-02-15 NOTE — Discharge Instructions (Signed)
Hypertension During Pregnancy Hypertension is also called high blood pressure. High blood pressure means that the force of your blood moving in your body is too strong. When you are pregnant, this condition should be watched carefully. It can cause problems for you and your baby. Follow these instructions at home: Eating and drinking  Drink enough fluid to keep your pee (urine) clear or pale yellow.  Eat healthy foods that are low in salt (sodium). ? Do not add salt to your food. ? Check labels on foods and drinks to see much salt is in them. Look on the label where you see "Sodium." Lifestyle  Do not use any products that contain nicotine or tobacco, such as cigarettes and e-cigarettes. If you need help quitting, ask your doctor.  Do not use alcohol.  Avoid caffeine.  Avoid stress. Rest and get plenty of sleep. General instructions  Take over-the-counter and prescription medicines only as told by your doctor.  While lying down, lie on your left side. This keeps pressure off your baby.  While sitting or lying down, raise (elevate) your feet. Try putting some pillows under your lower legs.  Exercise regularly. Ask your doctor what kinds of exercise are best for you.  Keep all prenatal and follow-up visits as told by your doctor. This is important. Contact a doctor if:  You have symptoms that your doctor told you to watch for, such as: ? Fever. ? Throwing up (vomiting). ? Headache. Get help right away if:  You have very bad pain in your belly (abdomen).  You are throwing up, and this does not get better with treatment.  You suddenly get swelling in your hands, ankles, or face.  You gain 4 lb (1.8 kg) or more in 1 week.  You get bleeding from your vagina.  You have blood in your pee.  You do not feel your baby moving as much as normal.  You have a change in vision.  You have muscle twitching or sudden tightening (spasms).  You have trouble breathing.  Your lips  or fingernails turn blue. This information is not intended to replace advice given to you by your health care provider. Make sure you discuss any questions you have with your health care provider. Document Released: 01/03/2011 Document Revised: 08/12/2016 Document Reviewed: 08/12/2016 Elsevier Interactive Patient Education  2018 [employer]. [employer] of the uterus can occur throughout pregnancy, but they are not always a sign that you are in labor. You may have practice contractions called Braxton Hicks contractions. These false labor contractions are sometimes confused with true labor. What are [place] contractions? Braxton Hicks contractions are tightening movements that occur in the muscles of the uterus before labor. Unlike true labor contractions, these contractions do not result in opening (dilation) and thinning of the cervix. Toward the end of pregnancy (32-34 weeks), Braxton Hicks contractions can happen more often and may become stronger. These contractions are sometimes difficult to tell apart from true labor because they can be very uncomfortable. You should not feel embarrassed if you go to the hospital with false labor. Sometimes, the only way to tell if you are in true labor is for your health care provider to look for changes in the cervix. The health care provider will do a physical exam and may monitor your contractions. If you are not in true labor, the exam should show that your cervix is not dilating and your water has not broken. If there are other health problems  associated with your pregnancy, it is completely safe for you to be sent home with false labor. You may continue to have Braxton Hicks contractions until you go into true labor. How to tell the difference between true labor and false labor True labor  Contractions last 30-70 seconds.  Contractions become very regular.  Discomfort is usually felt in the top of the uterus, and it  spreads to the lower abdomen and low back.  Contractions do not go away with walking.  Contractions usually become more intense and increase in frequency.  The cervix dilates and gets thinner. False labor  Contractions are usually shorter and not as strong as true labor contractions.  Contractions are usually irregular.  Contractions are often felt in the front of the lower abdomen and in the groin.  Contractions may go away when you walk around or change positions while lying down.  Contractions get weaker and are shorter-lasting as time goes on.  The cervix usually does not dilate or become thin. Follow these instructions at home:  Take over-the-counter and prescription medicines only as told by your health care provider.  Keep up with your usual exercises and follow other instructions from your health care provider.  Eat and drink lightly if you think you are going into labor.  If Braxton Hicks contractions are making you uncomfortable: ? Change your position from lying down or resting to walking, or change from walking to resting. ? Sit and rest in a tub of warm water. ? Drink enough fluid to keep your urine pale yellow. Dehydration may cause these contractions. ? Do slow and deep breathing several times an hour.  Keep all follow-up prenatal visits as told by your health care provider. This is important. Contact a health care provider if:  You have a fever.  You have continuous pain in your abdomen. Get help right away if:  Your contractions become stronger, more regular, and closer together.  You have fluid leaking or gushing from your vagina.  You have bleeding from your vagina.  You have low back pain that you never had before.  You feel your babys head pushing down and causing pelvic pressure.  Your baby is not moving inside you as much as it used to. Summary  Contractions that occur before labor are called Braxton Hicks contractions, false labor, or  practice contractions.  Braxton Hicks contractions are usually shorter, weaker, farther apart, and less regular than true labor contractions. True labor contractions usually become progressively stronger and regular and they become more frequent.  Manage discomfort from [facility] contractions by changing position, resting in a warm bath, drinking plenty of water, or practicing deep breathing. This information is not intended to replace advice given to you by your health care provider. Make sure you discuss any questions you have with your health care provider. Document Released: 04/16/2017 Document Revised: 04/16/2017 Document Reviewed: 04/16/2017 Elsevier Interactive Patient Education  2018 [employer].

## 2018-02-15 NOTE — MAU Note (Signed)
Urine in lab 

## 2018-02-15 NOTE — MAU Note (Signed)
Is contracting and feels like her blood pressure is high. (nothing specific, just the way she feels). Denies HA, visual changes, epigastric pain or increase in swelling. No bleeding or leaking.

## 2018-02-15 NOTE — MAU Provider Note (Addendum)
History     CSN: [id number]  Arrival date and time: 02/15/18 1535   First Provider Initiated Contact with Patient 02/15/18 1642      Chief Complaint  Patient presents with  . Hypertension  . Contractions   [id number] @36 .5 wks here for "I think my blood pressure is up, because I'm hurting". Reports ctx x3 days. Unsure of frequency. No VB or LOF. Lost mucous plug twice. Denies HA, visual disturbances, and epigastric pain. +FM today. Her pregnancy is complicated by pre-e w/o severe features and plan for IOL at 37 wks.    OB History    Gravida Para Term Preterm AB Living   3 2 1 1  0 2   SAB TAB Ectopic Multiple Live Births   0     0 2      Past Medical History:  Diagnosis Date  . Anxiety   . Asthma    inhaler PRN-hasn't used inhaler in months  . Attention deficit hyperactivity disorder   . Bipolar disorder [facility])    hospitalized as a teen for suicidal ideations and cutting  . Depression   . Gastritis   . GERD (gastroesophageal reflux disease)   . HPV (human papilloma virus) anogenital infection   . Hx of varicella   . Mild preeclampsia 02/09/2016  . Vaginal Pap smear, abnormal     Past Surgical History:  Procedure Laterality Date  . EYE MUSCLE SURGERY Bilateral    07/29/12  . MOUTH SURGERY    . PLANTAR FASCIA SURGERY    . WISDOM TOOTH EXTRACTION      Family History  Problem Relation Age of Onset  . Depression Mother   . Colon cancer Mother   . Colon polyps Mother   . Depression Father   . Depression Brother   . Liver disease Unknown   . Kidney disease Unknown     Social History   Tobacco Use  . Smoking status: Never Smoker  . Smokeless tobacco: Never Used  Substance Use Topics  . Alcohol use: No    Alcohol/week: 0.0 oz  . Drug use: No    Allergies:  Allergies  Allergen Reactions  . Depakote [Divalproex Sodium] Other (See Comments)    Pt states that this medication makes her blood levels toxic.    [name] Methylphenidate Derivatives Other (See Comments)     Reaction:  Depression and anger   . Neurontin [Gabapentin] Other (See Comments)    Reaction:  Dizziness   . Prozac [Fluoxetine Hcl] Other (See Comments)    Reaction:  Anger     Medications Prior to Admission  Medication Sig Dispense Refill Last Dose  . Prenatal Vit-Fe Fumarate-FA (PRENATAL MULTIVITAMIN) TABS tablet Take 1 tablet at bedtime by mouth.    02/14/2018 at Unknown time  . cyclobenzaprine (FLEXERIL) 10 MG tablet Take 0.5-1 tablets (5-10 mg total) by mouth 3 (three) times daily as needed for muscle spasms. 30 tablet 0 Taking  . Elastic Bandages & Supports (COMFORT FIT MATERNITY SUPP MED) MISC 1 Device by Does not apply route daily. 1 each 0 Taking    Review of Systems  Eyes: Negative for visual disturbance.  Gastrointestinal: Positive for abdominal pain (ctx).  Genitourinary: Negative for vaginal bleeding and vaginal discharge.  Musculoskeletal: Positive for back pain.  Neurological: Negative for headaches.   Physical Exam   Blood pressure 131/89, pulse 85, temperature 98.8 F (37.1 C), temperature source Oral, resp. rate 18, weight 204 lb 8 oz (92.8 kg), SpO2 100 %, unknown  if currently breastfeeding. Patient Vitals for the past 24 hrs:  BP Temp Temp src Pulse Resp SpO2 Weight  02/15/18 1729 118/82 -- -- 72 -- -- --  02/15/18 1716 120/83 -- -- 78 -- -- --  02/15/18 1701 127/85 -- -- 98 -- -- --  02/15/18 1658 (!) 120/96 -- -- 84 -- -- --  02/15/18 1646 122/86 -- -- 83 -- -- --  02/15/18 1631 131/89 -- -- 85 -- -- --  02/15/18 1626 (!) 126/94 -- -- 93 -- -- --  02/15/18 1613 (!) 116/92 -- -- (!) 110 -- -- --  02/15/18 1552 (!) 139/92 98.8 F (37.1 C) Oral (!) 111 18 100 % 204 lb 8 oz (92.8 kg)    Physical Exam  Nursing note and vitals reviewed. Constitutional: She is oriented to person, place, and time. She appears well-developed and well-nourished. No distress.  HENT:  Head: Normocephalic and atraumatic.  Neck: Normal range of motion.  Cardiovascular: Normal  rate.  Respiratory: Effort normal. No respiratory distress.  Genitourinary:  Genitourinary Comments: SVE 2.5/50/-3, vtx  Musculoskeletal: Normal range of motion.  Neurological: She is alert and oriented to person, place, and time.  Skin: Skin is warm and dry.  Psychiatric: She has a normal mood and affect.  EFM: 135 bpm, mod variability, + accels, no decels Toco: irregular  Results for orders placed or performed during the hospital encounter of 02/15/18 (from the past 24 hour(s))  Protein / creatinine ratio, urine     Status: Abnormal   Collection Time: 02/15/18  3:55 PM  Result Value Ref Range   Creatinine, Urine 254.00 mg/dL   Total Protein, Urine 100 mg/dL   Protein Creatinine Ratio 0.39 (H) 0.00 - 0.15 mg/mg[Cre]  CBC     Status: Abnormal   Collection Time: 02/15/18  4:26 PM  Result Value Ref Range   WBC 7.1 4.0 - 10.5 K/uL   RBC 3.88 3.87 - 5.11 MIL/uL   Hemoglobin 11.4 (L) 12.0 - 15.0 g/dL   HCT 34.5 (L) 36.0 - 46.0 %   MCV 88.9 78.0 - 100.0 fL   MCH 29.4 26.0 - 34.0 pg   MCHC 33.0 30.0 - 36.0 g/dL   RDW 14.2 11.5 - 15.5 %   Platelets 126 (L) 150 - 400 K/uL  Comprehensive metabolic panel     Status: Abnormal   Collection Time: 02/15/18  4:26 PM  Result Value Ref Range   Sodium 133 (L) 135 - 145 mmol/L   Potassium 4.1 3.5 - 5.1 mmol/L   Chloride 105 101 - 111 mmol/L   CO2 20 (L) 22 - 32 mmol/L   Glucose, Bld 82 65 - 99 mg/dL   BUN 9 6 - 20 mg/dL   Creatinine, Ser 0.75 0.44 - 1.00 mg/dL   Calcium 8.5 (L) 8.9 - 10.3 mg/dL   Total Protein 7.1 6.5 - 8.1 g/dL   Albumin 3.1 (L) 3.5 - 5.0 g/dL   AST 21 15 - 41 U/L   ALT 19 14 - 54 U/L   Alkaline Phosphatase 182 (H) 38 - 126 U/L   Total Bilirubin 0.9 0.3 - 1.2 mg/dL   GFR calc non Af Amer >60 >60 mL/min   GFR calc Af Amer >60 >60 mL/min   Anion gap 8 5 - 15   MAU Course  Procedures  MDM Labs ordered and reviewed. No evidence of labor. No evidence of severe features. Stable for discharge home.   Assessment and  Plan   1. False  labor   2. [redacted] weeks gestation of pregnancy   3. NST (non-stress test) reactive   4. Pre-eclampsia in third trimester    Discharge home Follow up in OB office tomorrow as scheduled IOL scheduled for 02/19/18 Labor precautions Pre-e return precautions  Allergies as of 02/15/2018      Reactions   Depakote [divalproex Sodium] Other (See Comments)   Pt states that this medication makes her blood levels toxic.     Methylphenidate Derivatives Other (See Comments)   Reaction:  Depression and anger    Neurontin [gabapentin] Other (See Comments)   Reaction:  Dizziness    Prozac [fluoxetine Hcl] Other (See Comments)   Reaction:  Anger       Medication List    TAKE these medications   COMFORT FIT MATERNITY SUPP MED Misc 1 Device by Does not apply route daily.   cyclobenzaprine 10 MG tablet Commonly known as:  FLEXERIL Take 0.5-1 tablets (5-10 mg total) by mouth 3 (three) times daily as needed for muscle spasms.   prenatal multivitamin Tabs tablet Take 1 tablet at bedtime by mouth.      Julianne Handler, CNM 02/15/2018, 4:42 PM

## 2018-02-16 ENCOUNTER — Encounter: Payer: Self-pay | Admitting: Certified Nurse Midwife

## 2018-02-16 ENCOUNTER — Ambulatory Visit (INDEPENDENT_AMBULATORY_CARE_PROVIDER_SITE_OTHER): Payer: Medicare Other | Admitting: Certified Nurse Midwife

## 2018-02-16 VITALS — BP 128/94 | HR 87 | Wt 202.0 lb

## 2018-02-16 DIAGNOSIS — O133 Gestational [pregnancy-induced] hypertension without significant proteinuria, third trimester: Secondary | ICD-10-CM

## 2018-02-16 DIAGNOSIS — O0973 Supervision of high risk pregnancy due to social problems, third trimester: Secondary | ICD-10-CM
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
## 2018-02-18 ENCOUNTER — Encounter: Payer: Self-pay | Admitting: Certified Nurse Midwife

## 2018-02-18 ENCOUNTER — Telehealth: Payer: Self-pay

## 2018-02-18 NOTE — Telephone Encounter (Signed)
Received TC from pt wants IOL today. She is having contractions every 30-40 mins. Denies LOF, VB, has +FM. Informed pt to keep monitoring contractions and if anything changes or worsens, to call us or report to MAU after hours. Pt voices understanding and has no further questions.

## 2018-02-19 ENCOUNTER — Other Ambulatory Visit: Payer: Self-pay | Admitting: Certified Nurse Midwife

## 2018-02-19 ENCOUNTER — Encounter (HOSPITAL_COMMUNITY): Payer: Self-pay

## 2018-02-19 ENCOUNTER — Inpatient Hospital Stay (HOSPITAL_COMMUNITY): Payer: Medicare Other | Admitting: Anesthesiology

## 2018-02-19 ENCOUNTER — Inpatient Hospital Stay (HOSPITAL_COMMUNITY)
Admission: RE | Admit: 2018-02-19 | Discharge: 2018-02-21 | DRG: 806 | Disposition: A | Discharge status: Home / Self Care | Payer: Medicare Other | Source: Ambulatory Visit | Attending: Obstetrics & Gynecology | Admitting: Obstetrics & Gynecology

## 2018-02-19 VITALS — BP 117/71 | HR 65 | Temp 98.7°F | Resp 18 | Ht 63.0 in | Wt 208.0 lb

## 2018-02-19 DIAGNOSIS — O99344 Other mental disorders complicating childbirth: Secondary | ICD-10-CM | POA: Diagnosis present

## 2018-02-19 DIAGNOSIS — F419 Anxiety disorder, unspecified: Secondary | ICD-10-CM | POA: Diagnosis present

## 2018-02-19 DIAGNOSIS — F900 Attention-deficit hyperactivity disorder, predominantly inattentive type: Secondary | ICD-10-CM | POA: Diagnosis present

## 2018-02-19 DIAGNOSIS — O09219 Supervision of pregnancy with history of pre-term labor, unspecified trimester: Secondary | ICD-10-CM

## 2018-02-19 DIAGNOSIS — O139 Gestational [pregnancy-induced] hypertension without significant proteinuria, unspecified trimester: Secondary | ICD-10-CM | POA: Diagnosis present

## 2018-02-19 DIAGNOSIS — D696 Thrombocytopenia, unspecified: Secondary | ICD-10-CM

## 2018-02-19 DIAGNOSIS — O3663X0 Maternal care for excessive fetal growth, third trimester, not applicable or unspecified: Secondary | ICD-10-CM | POA: Diagnosis present

## 2018-02-19 DIAGNOSIS — O99113 Other diseases of the blood and blood-forming organs and certain disorders involving the immune mechanism complicating pregnancy, third trimester: Secondary | ICD-10-CM

## 2018-02-19 DIAGNOSIS — Z8632 Personal history of gestational diabetes: Secondary | ICD-10-CM

## 2018-02-19 DIAGNOSIS — O4700 False labor before 37 completed weeks of gestation, unspecified trimester: Secondary | ICD-10-CM | POA: Diagnosis present

## 2018-02-19 DIAGNOSIS — D6959 Other secondary thrombocytopenia: Secondary | ICD-10-CM | POA: Diagnosis not present

## 2018-02-19 DIAGNOSIS — O0973 Supervision of high risk pregnancy due to social problems, third trimester: Secondary | ICD-10-CM

## 2018-02-19 DIAGNOSIS — F319 Bipolar disorder, unspecified: Secondary | ICD-10-CM | POA: Diagnosis present

## 2018-02-19 DIAGNOSIS — O99214 Obesity complicating childbirth: Secondary | ICD-10-CM | POA: Diagnosis present

## 2018-02-19 DIAGNOSIS — O09299 Supervision of pregnancy with other poor reproductive or obstetric history, unspecified trimester: Secondary | ICD-10-CM

## 2018-02-19 DIAGNOSIS — O1404 Mild to moderate pre-eclampsia, complicating childbirth: Principal | ICD-10-CM | POA: Diagnosis present

## 2018-02-19 DIAGNOSIS — Z3A37 37 weeks gestation of pregnancy: Secondary | ICD-10-CM | POA: Diagnosis not present

## 2018-02-19 DIAGNOSIS — O9912 Other diseases of the blood and blood-forming organs and certain disorders involving the immune mechanism complicating childbirth: Secondary | ICD-10-CM | POA: Diagnosis present

## 2018-02-19 DIAGNOSIS — IMO0002 Reserved for concepts with insufficient information to code with codable children: Secondary | ICD-10-CM | POA: Diagnosis present

## 2018-02-19 DIAGNOSIS — O99119 Other diseases of the blood and blood-forming organs and certain disorders involving the immune mechanism complicating pregnancy, unspecified trimester: Secondary | ICD-10-CM | POA: Diagnosis present

## 2018-02-19 DIAGNOSIS — O09899 Supervision of other high risk pregnancies, unspecified trimester: Secondary | ICD-10-CM

## 2018-02-19 DIAGNOSIS — O0970 Supervision of high risk pregnancy due to social problems, unspecified trimester: Secondary | ICD-10-CM

## 2018-02-19 DIAGNOSIS — O134 Gestational [pregnancy-induced] hypertension without significant proteinuria, complicating childbirth: Secondary | ICD-10-CM | POA: Diagnosis not present

## 2018-02-19 DIAGNOSIS — O479 False labor, unspecified: Secondary | ICD-10-CM | POA: Diagnosis present

## 2018-02-19 DIAGNOSIS — O3660X0 Maternal care for excessive fetal growth, unspecified trimester, not applicable or unspecified: Secondary | ICD-10-CM | POA: Diagnosis present

## 2018-02-19 LAB — TYPE AND SCREEN
ABO/RH(D): O POS
Antibody Screen: NEGATIVE

## 2018-02-19 LAB — RAPID URINE DRUG SCREEN, HOSP PERFORMED
Amphetamines: NOT DETECTED
Barbiturates: NOT DETECTED
Benzodiazepines: NOT DETECTED
Cocaine: NOT DETECTED
Opiates: NOT DETECTED
Tetrahydrocannabinol: NOT DETECTED

## 2018-02-19 LAB — CBC
HCT: 32.2 % — ABNORMAL LOW (ref 36.0–46.0)
HCT: 28 % — ABNORMAL LOW (ref 36.0–46.0)
Hemoglobin: 10.8 g/dL — ABNORMAL LOW (ref 12.0–15.0)
Hemoglobin: 9.4 g/dL — ABNORMAL LOW (ref 12.0–15.0)
MCH: 29.7 pg (ref 26.0–34.0)
MCH: 29.8 pg (ref 26.0–34.0)
MCHC: 33.5 g/dL (ref 30.0–36.0)
MCHC: 33.6 g/dL (ref 30.0–36.0)
MCV: 88.5 fL (ref 78.0–100.0)
MCV: 88.9 fL (ref 78.0–100.0)
Platelets: 128 10*3/uL — ABNORMAL LOW (ref 150–400)
Platelets: 102 10*3/uL — ABNORMAL LOW (ref 150–400)
RBC: 3.64 MIL/uL — ABNORMAL LOW (ref 3.87–5.11)
RBC: 3.15 MIL/uL — ABNORMAL LOW (ref 3.87–5.11)
RDW: 14.2 % (ref 11.5–15.5)
RDW: 14.2 % (ref 11.5–15.5)
WBC: 8 10*3/uL (ref 4.0–10.5)
WBC: 7 10*3/uL (ref 4.0–10.5)

## 2018-02-19 LAB — COMPREHENSIVE METABOLIC PANEL
ALT: 12 U/L — ABNORMAL LOW (ref 14–54)
AST: 16 U/L (ref 15–41)
Albumin: 2.8 g/dL — ABNORMAL LOW (ref 3.5–5.0)
Alkaline Phosphatase: 193 U/L — ABNORMAL HIGH (ref 38–126)
Anion gap: 10 (ref 5–15)
BUN: 9 mg/dL (ref 6–20)
CO2: 18 mmol/L — ABNORMAL LOW (ref 22–32)
Calcium: 8.4 mg/dL — ABNORMAL LOW (ref 8.9–10.3)
Chloride: 105 mmol/L (ref 101–111)
Creatinine, Ser: 0.62 mg/dL (ref 0.44–1.00)
GFR calc Af Amer: >60 mL/min (ref >60)
GFR calc non Af Amer: >60 mL/min (ref >60)
Glucose, Bld: 84 mg/dL (ref 65–99)
Potassium: 4.2 mmol/L (ref 3.5–5.1)
Sodium: 133 mmol/L — ABNORMAL LOW (ref 135–145)
Total Bilirubin: 1 mg/dL (ref 0.3–1.2)
Total Protein: 6.5 g/dL (ref 6.5–8.1)

## 2018-02-19 LAB — RPR: RPR Ser Ql: NONREACTIVE

## 2018-02-19 LAB — CYSTIC FIBROSIS MUTATION 97: Interpretation: NOT DETECTED

## 2018-02-19 MED ORDER — OXYTOCIN 40 UNITS IN LACTATED RINGERS INFUSION - SIMPLE MED
1.0000 m[IU]/min | INTRAVENOUS | Status: DC
  Administered 2018-02-19: 2 m[IU]/min via INTRAVENOUS

## 2018-02-19 MED ORDER — FLEET ENEMA 7-19 GM/118ML RE ENEM: 1.0000 | ENEMA | RECTAL | Status: DC | PRN

## 2018-02-19 MED ORDER — ACETAMINOPHEN 325 MG PO TABS: 650.0000 mg | ORAL_TABLET | ORAL | Status: DC | PRN

## 2018-02-19 MED ORDER — ONDANSETRON HCL 4 MG PO TABS: 4.0000 mg | ORAL_TABLET | ORAL | Status: DC | PRN

## 2018-02-19 MED ORDER — OXYCODONE-ACETAMINOPHEN 5-325 MG PO TABS: 2.0000 | ORAL_TABLET | ORAL | Status: DC | PRN

## 2018-02-19 MED ORDER — EPHEDRINE 5 MG/ML INJ
10.0000 mg | INTRAVENOUS | Status: DC | PRN
  Filled 2018-02-19: qty 2

## 2018-02-19 MED ORDER — BENZOCAINE-MENTHOL 20-0.5 % EX AERO: 1.0000 "application " | INHALATION_SPRAY | CUTANEOUS | Status: DC | PRN

## 2018-02-19 MED ORDER — ERYTHROMYCIN 5 MG/GM OP OINT
TOPICAL_OINTMENT | OPHTHALMIC | Status: AC
  Filled 2018-02-19: qty 1

## 2018-02-19 MED ORDER — SENNOSIDES-DOCUSATE SODIUM 8.6-50 MG PO TABS
2.0000 | ORAL_TABLET | ORAL | Status: DC
  Filled 2018-02-19 (×2): qty 2

## 2018-02-19 MED ORDER — FENTANYL 2.5 MCG/ML BUPIVACAINE 1/10 % EPIDURAL INFUSION (WH - ANES)
14.0000 mL/h | INTRAMUSCULAR | Status: DC | PRN
  Administered 2018-02-19 (×2): 14 mL/h via EPIDURAL
  Filled 2018-02-19 (×2): qty 100

## 2018-02-19 MED ORDER — SOD CITRATE-CITRIC ACID 500-334 MG/5ML PO SOLN: 30.0000 mL | ORAL | Status: DC | PRN

## 2018-02-19 MED ORDER — COCONUT OIL OIL
1.0000 "application " | TOPICAL_OIL | Status: DC | PRN
  Administered 2018-02-20: 1 via TOPICAL
  Filled 2018-02-19: qty 120

## 2018-02-19 MED ORDER — IBUPROFEN 600 MG PO TABS
600.0000 mg | ORAL_TABLET | Freq: Four times a day (QID) | ORAL | Status: DC
  Administered 2018-02-20 – 2018-02-21 (×5): 600 mg via ORAL
  Filled 2018-02-19 (×6): qty 1

## 2018-02-19 MED ORDER — TERBUTALINE SULFATE 1 MG/ML IJ SOLN
0.2500 mg | Freq: Once | INTRAMUSCULAR | Status: DC | PRN
Start: 2018-02-19 — End: 2018-02-19
  Filled 2018-02-19: qty 1

## 2018-02-19 MED ORDER — WITCH HAZEL-GLYCERIN EX PADS: 1.0000 "application " | MEDICATED_PAD | CUTANEOUS | Status: DC | PRN

## 2018-02-19 MED ORDER — ZOLPIDEM TARTRATE 5 MG PO TABS: 5.0000 mg | ORAL_TABLET | Freq: Every evening | ORAL | Status: DC | PRN

## 2018-02-19 MED ORDER — ACETAMINOPHEN 325 MG PO TABS
650.0000 mg | ORAL_TABLET | ORAL | Status: DC | PRN
  Administered 2018-02-20: 650 mg via ORAL
  Filled 2018-02-19: qty 2

## 2018-02-19 MED ORDER — LIDOCAINE HCL (PF) 1 % IJ SOLN
30.0000 mL | INTRAMUSCULAR | Status: DC | PRN
  Filled 2018-02-19: qty 30

## 2018-02-19 MED ORDER — FENTANYL CITRATE (PF) 100 MCG/2ML IJ SOLN
100.0000 ug | INTRAMUSCULAR | Status: DC | PRN
  Administered 2018-02-19 (×2): 100 ug via INTRAVENOUS
  Filled 2018-02-19 (×2): qty 2

## 2018-02-19 MED ORDER — DIPHENHYDRAMINE HCL 50 MG/ML IJ SOLN: 12.5000 mg | INTRAMUSCULAR | Status: DC | PRN

## 2018-02-19 MED ORDER — TETANUS-DIPHTH-ACELL PERTUSSIS 5-2.5-18.5 LF-MCG/0.5 IM SUSP: 0.5000 mL | Freq: Once | INTRAMUSCULAR | Status: DC

## 2018-02-19 MED ORDER — PRENATAL MULTIVITAMIN CH
1.0000 | ORAL_TABLET | Freq: Every day | ORAL | Status: DC
  Filled 2018-02-19 (×2): qty 1

## 2018-02-19 MED ORDER — LACTATED RINGERS IV SOLN: 500.0000 mL | INTRAVENOUS | Status: DC | PRN

## 2018-02-19 MED ORDER — LACTATED RINGERS IV SOLN
INTRAVENOUS | Status: DC
  Administered 2018-02-19 (×2): via INTRAVENOUS

## 2018-02-19 MED ORDER — CEFAZOLIN SODIUM-DEXTROSE 1-4 GM/50ML-% IV SOLN
1.0000 g | Freq: Three times a day (TID) | INTRAVENOUS | Status: DC
  Filled 2018-02-19: qty 50

## 2018-02-19 MED ORDER — OXYTOCIN 10 UNIT/ML IJ SOLN: 10.0000 [IU] | Freq: Once | INTRAMUSCULAR | Status: DC

## 2018-02-19 MED ORDER — OXYTOCIN 40 UNITS IN LACTATED RINGERS INFUSION - SIMPLE MED
2.5000 [IU]/h | INTRAVENOUS | Status: DC
  Filled 2018-02-19: qty 1000

## 2018-02-19 MED ORDER — OXYTOCIN BOLUS FROM INFUSION
500.0000 mL | Freq: Once | INTRAVENOUS | Status: AC
  Administered 2018-02-19: 500 mL via INTRAVENOUS

## 2018-02-19 MED ORDER — LIDOCAINE HCL (PF) 1 % IJ SOLN
INTRAMUSCULAR | Status: DC | PRN
  Administered 2018-02-19: 4 mL
  Administered 2018-02-19: 6 mL via EPIDURAL

## 2018-02-19 MED ORDER — ONDANSETRON HCL 4 MG/2ML IJ SOLN: 4.0000 mg | INTRAMUSCULAR | Status: DC | PRN

## 2018-02-19 MED ORDER — SIMETHICONE 80 MG PO CHEW: 80.0000 mg | CHEWABLE_TABLET | ORAL | Status: DC | PRN

## 2018-02-19 MED ORDER — PHENYLEPHRINE 40 MCG/ML (10ML) SYRINGE FOR IV PUSH (FOR BLOOD PRESSURE SUPPORT)
80.0000 ug | PREFILLED_SYRINGE | INTRAVENOUS | Status: DC | PRN
  Filled 2018-02-19: qty 5

## 2018-02-19 MED ORDER — DIBUCAINE 1 % RE OINT: 1.0000 "application " | TOPICAL_OINTMENT | RECTAL | Status: DC | PRN

## 2018-02-19 MED ORDER — TERBUTALINE SULFATE 1 MG/ML IJ SOLN
0.2500 mg | Freq: Once | INTRAMUSCULAR | Status: DC | PRN
  Filled 2018-02-19: qty 1

## 2018-02-19 MED ORDER — OXYCODONE-ACETAMINOPHEN 5-325 MG PO TABS: 1.0000 | ORAL_TABLET | ORAL | Status: DC | PRN

## 2018-02-19 MED ORDER — PHENYLEPHRINE 40 MCG/ML (10ML) SYRINGE FOR IV PUSH (FOR BLOOD PRESSURE SUPPORT)
80.0000 ug | PREFILLED_SYRINGE | INTRAVENOUS | Status: DC | PRN
  Filled 2018-02-19: qty 5
  Filled 2018-02-19: qty 10

## 2018-02-19 MED ORDER — ONDANSETRON HCL 4 MG/2ML IJ SOLN
4.0000 mg | Freq: Four times a day (QID) | INTRAMUSCULAR | Status: DC | PRN
  Administered 2018-02-19: 4 mg via INTRAVENOUS
  Filled 2018-02-19: qty 2

## 2018-02-19 MED ORDER — DIPHENHYDRAMINE HCL 25 MG PO CAPS
25.0000 mg | ORAL_CAPSULE | Freq: Four times a day (QID) | ORAL | Status: DC | PRN
  Administered 2018-02-19: 25 mg via ORAL
  Filled 2018-02-19: qty 1

## 2018-02-19 MED ORDER — CEFAZOLIN SODIUM-DEXTROSE 2-4 GM/100ML-% IV SOLN: 2.0000 g | Freq: Once | INTRAVENOUS | Status: DC

## 2018-02-19 MED ORDER — LACTATED RINGERS IV SOLN
500.0000 mL | Freq: Once | INTRAVENOUS | Status: AC
  Administered 2018-02-19: 500 mL via INTRAVENOUS
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
## 2018-02-19 NOTE — Anesthesia Preprocedure Evaluation (Signed)
Anesthesia Evaluation  Patient identified by MRN, date of birth, ID band Patient awake    Reviewed: Allergy & Precautions, H&P , Patient's Chart, lab work & pertinent test results  Airway Mallampati: II  TM Distance: >3 FB Neck ROM: full    Dental no notable dental hx.    Pulmonary asthma , sleep apnea ,    Pulmonary exam normal breath sounds clear to auscultation       Cardiovascular Exercise Tolerance: Good hypertension,  Rhythm:regular Rate:Normal     Neuro/Psych    GI/Hepatic   Endo/Other  Morbid obesity  Renal/GU      Musculoskeletal   Abdominal   Peds  Hematology   Anesthesia Other Findings   Reproductive/Obstetrics                             Anesthesia Physical Anesthesia Plan  ASA: III  Anesthesia Plan: Epidural   Post-op Pain Management:    Induction:   PONV Risk Score and Plan:   Airway Management Planned:   Additional Equipment:   Intra-op Plan:   Post-operative Plan:   Informed Consent: I have reviewed the patients History and Physical, chart, labs and discussed the procedure including the risks, benefits and alternatives for the proposed anesthesia with the patient or authorized representative who has indicated his/her understanding and acceptance.   Dental Advisory Given  Plan Discussed with:   Anesthesia Plan Comments: (Labs checked- platelets confirmed with RN in room. Fetal heart tracing, per RN, reported to be stable enough for sitting procedure. Discussed epidural, and patient consents to the procedure:  included risk of possible headache,backache, failed block, allergic reaction, and nerve injury. This patient was asked if she had any questions or concerns before the procedure started.)        Anesthesia Quick Evaluation

## 2018-02-19 NOTE — Progress Notes (Signed)
Labor Progress Note TAYLA PANOZZO is a 32 y.o. W2N5621 at [redacted]w[redacted]d presented for IOL for preE without severe features.  S: Patient comfortable with epidural.  O:  BP 119/81   Pulse 91   Temp 98.3 F (36.8 C) (Oral)   Resp 18   Ht 5\' 3"  (1.6 m)   Wt 208 lb (94.3 kg)   SpO2 98%   BMI 36.85 kg/m   HYQ:MVHQIONG rate 115, moderate variability, no acels, no decels Toco: ctx every 2 min  CVE: Dilation: 10 Dilation Complete Date: 02/19/18 Dilation Complete Time: 1702 Effacement (%): 70 Cervical Position: Middle Station: 0 Presentation: Vertex Exam by:: Kataleah Bejar  A&P: 32 y.o. [id number] [redacted]w[redacted]d here for IOL for preE without severe features. #Labor: Progressing well. Anticipate SVD soon. #Pain: Epidural #FWB: Cat I #preE: BP wnl, asymptomatic  Rory Percy, DO 5:15 PM
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
## 2018-02-19 NOTE — Anesthesia Procedure Notes (Signed)
Epidural Patient location during procedure: OB  Staffing Anesthesiologist: Lyndle Herrlich, MD  Preanesthetic Checklist Completed: patient identified, pre-op evaluation, timeout performed, IV checked, risks and benefits discussed and monitors and equipment checked  Epidural Patient position: sitting Prep: DuraPrep Patient monitoring: blood pressure and continuous pulse ox Approach: right paramedian Location: L2-L3 Injection technique: LOR air  Needle:  Needle type: Tuohy  Needle gauge: 17 G Needle insertion depth: 6 cm Catheter type: closed end flexible Catheter size: 19 Gauge Catheter at skin depth: 10 cm Test dose: negative  Assessment Sensory level: T8  Additional Notes  Chalanging epidural due to pt psych make up and tolerance Attempted procedure at L3-4 and then finally at one space higher. 1st LOR was suspicious and confirmation of placement via sprotte yielded a transient parasthesia.  I elected to go one space higher after sx of paesthesia cleared: Dosing of Epidural:  1st dose, through catheter ............................................[name]  Xylocaine 40 mg  2nd dose, through catheter, after waiting 3 minutes........Marland KitchenXylocaine 60 mg    As each dose occurred, patient was free of IV sx; and patient exhibited no evidence of SA injection.  Patient is more comfortable after epidural dosed. Please see RN's note for documentation of vital signs,and FHR which are stable.  Patient reminded not to try to ambulate with numb legs, and that an RN must be present when she attempts to get up.

## 2018-02-19 NOTE — Anesthesia Pain Management Evaluation Note (Signed)
  CRNA Pain Management Visit Note  Patient: [name], 32 y.o., female  "Hello I am a member of the anesthesia team at [facility]. We have an anesthesia team available at all times to provide care throughout the hospital, including epidural management and anesthesia for C-section. I don't know your plan for the delivery whether it a natural birth, water birth, IV sedation, nitrous supplementation, doula or epidural, but we want to meet your pain goals."   1.Was your pain managed to your expectations on prior hospitalizations?   Yes   2.What is your expectation for pain management during this hospitalization?     Epidural  3.How can we help you reach that goal? epidural  Record the patient's initial score and the patient's pain goal.   Pain: 3  Pain Goal: 9 [facility] wants you to be able to say your pain was always managed very well.  Haruna Rohlfs 02/19/2018

## 2018-02-19 NOTE — Progress Notes (Signed)
Labor Progress Note [name] is a 32 y.o. M6N8177 at [redacted]w[redacted]d presented for IOL for preE without severe features.  S: Patient anxious about delivery process. Able to reassure.  O:  BP 112/80   Pulse 82   Temp 98.3 F (36.8 C) (Oral)   Resp 18   Ht 5\' 3"  (1.6 m)   Wt 208 lb (94.3 kg)   SpO2 98%   BMI 36.85 kg/m   NHA:FBXUXYBF rate 125, moderate variability, + acels, no decels Toco: ctx every 2 min  CVE: Dilation: 4.5 Effacement (%): 70 Cervical Position: Middle Station: -2 Presentation: Vertex Exam by:: stone rnc  A&P: 32 y.o. [id number] [redacted]w[redacted]d here for IOL for preE without severe features. #Labor: AROM at 1415. Continue Pit, titrating as necessary. #Pain: Epidural #FWB: Cat I #preE: BP wnl with few isolated lows, asymptomatic.  Rory Percy, DO 2:41 PM
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
## 2018-02-20 ENCOUNTER — Other Ambulatory Visit: Payer: Self-pay | Admitting: Advanced Practice Midwife

## 2018-02-20 MED ORDER — MEDROXYPROGESTERONE ACETATE 150 MG/ML IM SUSP
150.0000 mg | Freq: Once | INTRAMUSCULAR | Status: DC
  Filled 2018-02-20: qty 1

## 2018-02-20 MED ORDER — ETONOGESTREL 68 MG ~~LOC~~ IMPL: 1.0000 | DRUG_IMPLANT | Freq: Once | SUBCUTANEOUS | 0 refills | Status: DC

## 2018-02-20 MED ORDER — IBUPROFEN 600 MG PO TABS: 600.0000 mg | ORAL_TABLET | Freq: Four times a day (QID) | ORAL | 0 refills | Status: DC

## 2018-02-20 NOTE — Progress Notes (Signed)
Stopped by to see the patient to discuss contraception with her. Advised patient that I have sent in a rx for a Nexplanon to her pharmacy, and if her partner picks it up we can insert it prior to DC home. Patient offered multiple excuses as to why he could not pick it up. I offered multiple solutions. She states that she really does not want a Nexplanon. She had on in the past, and was not happy with it. She states that she only wants to do depo. Will plan to given depo prior to dc home tomorrow.   Marcille Buffy 11:50 AM 02/20/18

## 2018-02-20 NOTE — Progress Notes (Signed)
Ms [facility] and [name] cared for infant well today.  They fed baby formula and also she did some breastfeeding, infant was changed appropriately and placed on his back in the bassinet to sleep.  Bonding seems appropriate.
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
## 2018-02-20 NOTE — Progress Notes (Signed)
Patient refused a tummy check and 0500 lab draws. She stated "the tummy checks hurt". Nurse called doctor to update them. They are coming to talk to the patient.

## 2018-02-20 NOTE — Progress Notes (Addendum)
POSTPARTUM PROGRESS NOTE  Post Partum Day 1 Subjective:  [name] is a 32 y.o. [phone] [redacted]w[redacted]d s/p IOL for preE.  No acute events overnight.  Pt denies problems with ambulating, voiding or po intake.  Some nausea but no vomiting.  Pain is well controlled on Ibuprofen. Lochia Minimal.   Objective: Blood pressure 112/65, pulse (!) 54, temperature 98.6 F (37 C), temperature source Oral, resp. rate 18, height 5\' 3"  (1.6 m), weight 94.3 kg (208 lb), SpO2 98 %, unknown if currently breastfeeding.  Physical Exam:  General: alert, cooperative and no distress Lochia:normal flow Chest: no respiratory distress Heart:regular rate, distal pulses intact Abdomen: soft, nontender,  Uterine Fundus: declined exam DVT Evaluation: No calf swelling or tenderness Extremities: no edema  Recent Labs    02/19/18 0740 02/19/18 1945  HGB 10.8* 9.4*  HCT 32.2* 28.0*    Assessment/Plan:  ASSESSMENT: [name] is a 32 y.o. [id number] [redacted]w[redacted]d s/p IOL for preE.  Plan discharge tomorrow MOF: bottle MOC: [place] work consult   LOS: 1 day   [name] 02/20/2018, 7:32 AM   CNM attestation Post Partum Day #1 I have seen and examined this patient and agree with above documentation in the med student's note.   [name] is a 32 y.o. [id number] s/p SVD.  Pt denies problems with ambulating, voiding or po intake. Pain is well controlled.  Plan for birth control is Nexplanon.  Method of Feeding: bottle  PE:  BP 112/65 (BP Location: Right Arm)   Pulse (!) 54   Temp 98.6 F (37 C) (Oral)   Resp 18   Ht 5\' 3"  (1.6 m)   Wt 94.3 kg (208 lb)   SpO2 98%   Breastfeeding? Unknown   BMI 36.85 kg/m  Fundus firm  Plan for discharge: 02/21/18  [name], CNM 9:28 AM 02/20/2018
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
## 2018-02-20 NOTE — Anesthesia Postprocedure Evaluation (Signed)
Anesthesia Post Note  Patient: [name]  Procedure(s) Performed: AN AD HOC LABOR EPIDURAL     Patient location during evaluation: Mother Baby Anesthesia Type: Epidural Level of consciousness: awake and alert and oriented Pain management: satisfactory to patient Vital Signs Assessment: post-procedure vital signs reviewed and stable Respiratory status: respiratory function stable Cardiovascular status: stable Postop Assessment: no headache, no backache, epidural receding, patient able to bend at knees, no signs of nausea or vomiting and adequate PO intake Anesthetic complications: no    Last Vitals:  Vitals:   02/19/18 2152 02/20/18 0159  BP: 126/77 112/65  Pulse: 67 (!) 54  Resp: 16 18  Temp: 36.9 C 37 C  SpO2:      Last Pain:  Vitals:   02/20/18 0745  TempSrc:   PainSc: 4    Pain Goal: Patients Stated Pain Goal: 3 (02/20/18 0745)               Katherina Mires
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
## 2018-02-21 ENCOUNTER — Encounter (HOSPITAL_COMMUNITY): Payer: Self-pay

## 2018-02-21 ENCOUNTER — Other Ambulatory Visit: Payer: Self-pay

## 2018-02-21 NOTE — Discharge Summary (Signed)
OB Discharge Summary     Patient Name: [name] DOB: [birth year] MRN: [id number]  Date of admission: 02/19/2018 Delivering MD: Ellwood Dense   Date of discharge: 02/21/2018  Admitting diagnosis: INDDUCTION Intrauterine pregnancy: [redacted]w[redacted]d     Secondary diagnosis:  Principal Problem:   Supervision of high risk pregnancy due to social problems Active Problems:   Hx of preeclampsia, prior pregnancy, currently pregnant   History of preterm delivery, currently pregnant   History of gestational diabetes in prior pregnancy, currently pregnant   Short interval between pregnancies affecting pregnancy, antepartum   Preterm contractions   Gestational hypertension   Gestational thrombocytopenia ([facility])   Large for gestational age fetus affecting management of mother   Encounter for trial of labor  Additional problems: CSW following     Discharge diagnosis: Term Pregnancy Delivered and Preeclampsia (mild)                                                                                                Post partum procedures:none  Augmentation: AROM and Pitocin  Complications: None  Hospital course:  Induction of Labor With Vaginal Delivery   32 y.o. yo [phone] at [redacted]w[redacted]d was admitted to the hospital 02/19/2018 for induction of labor.  Indication for induction: Preeclampsia.  Patient had an uncomplicated labor course as follows: Membrane Rupture Time/Date: 2:15 PM ,02/19/2018   Intrapartum Procedures: Episiotomy: None [1]                                         Lacerations:  None [1]  Patient had delivery of a Viable infant.  Information for the patient's newborn:  Zeruiah, Hamidi [[id number]]  Delivery Method: Vaginal, Spontaneous(Filed from Delivery Summary)   02/19/2018  Details of delivery can be found in separate delivery note.  Patient had a routine postpartum course. Patient is discharged home 02/21/18.  Physical exam  Vitals:   02/19/18 2053 02/19/18 2152 02/20/18 0159  02/20/18 1755  BP: 126/64 126/77 112/65 117/71  Pulse: (!) 59 67 (!) 54 65  Resp: 18 16 18 18   Temp: 98.9 F (37.2 C) 98.4 F (36.9 C) 98.6 F (37 C) 98.7 F (37.1 C)  TempSrc: Oral Oral Oral Oral  SpO2:    96%  Weight:      Height:       General: alert, cooperative and no distress Lochia: Pt states bleeding has decresed Uterine Fundus: declines fundal exams Incision: n/a DVT Evaluation: No evidence of DVT seen on physical exam. No cords or calf tenderness. Labs: Lab Results  Component Value Date   WBC 7.0 02/19/2018   HGB 9.4 (L) 02/19/2018   HCT 28.0 (L) 02/19/2018   MCV 88.9 02/19/2018   PLT 102 (L) 02/19/2018   CMP Latest Ref Rng & Units 02/19/2018  Glucose 65 - 99 mg/dL 84  BUN 6 - 20 mg/dL 9  Creatinine 5.64 - 3.32 mg/dL 9.51  Sodium 884 - 166 mmol/L 133(L)  Potassium 3.5 - 5.1 mmol/L 4.2  Chloride  101 - 111 mmol/L 105  CO2 22 - 32 mmol/L 18(L)  Calcium 8.9 - 10.3 mg/dL 1.6(X)  Total Protein 6.5 - 8.1 g/dL 6.5  Total Bilirubin 0.3 - 1.2 mg/dL 1.0  Alkaline Phos 38 - 126 U/L 193(H)  AST 15 - 41 U/L 16  ALT 14 - 54 U/L 12(L)    Discharge instruction: per After Visit Summary and "Baby and Me Booklet".  After visit meds:  Allergies as of 02/21/2018      Reactions   Depakote [divalproex Sodium] Other (See Comments)   Pt states that this medication makes her blood levels toxic.     Methylphenidate Derivatives Other (See Comments)   Reaction:  Depression and anger    Neurontin [gabapentin] Other (See Comments)   Reaction:  Dizziness    Prozac [fluoxetine Hcl] Other (See Comments)   Reaction:  Anger       Medication List    STOP taking these medications   COMFORT FIT MATERNITY SUPP MED Misc   cyclobenzaprine 10 MG tablet Commonly known as:  FLEXERIL     TAKE these medications   ibuprofen 600 MG tablet Commonly known as:  ADVIL,MOTRIN Take 1 tablet (600 mg total) by mouth every 6 (six) hours.   prenatal multivitamin Tabs tablet Take 1 tablet at  bedtime by mouth.       Diet: routine diet  Activity: Advance as tolerated. Pelvic rest for 6 weeks.   Outpatient follow up:6 weeks Follow up Appt:No future appointments. Follow up Visit:No Follow-up on file.  Postpartum contraception: Undecided  Newborn Data: Live born female  Birth Weight: 8 lb 5.7 oz (3790 g) APGAR: 9, 9  Newborn Delivery   Birth date/time:  02/19/2018 18:48:00 Delivery type:  Vaginal, Spontaneous     Baby Feeding: Bottle Disposition:home with mother   02/21/2018 Denilson Salminen O Layza Summa, Student-MidWife

## 2018-02-21 NOTE — Progress Notes (Signed)
CSW received call from [name] stating he would be to the hospital to meet with [name] around 10:00am and then call CSW with an update.

## 2018-02-21 NOTE — Progress Notes (Signed)
Received call from RN that patient refused fundal exams and lab draws. Pt instructed to notify if bleeding increases or s/s of anemia. Pt verbalized understanding.

## 2018-03-04 ENCOUNTER — Telehealth: Payer: Self-pay | Admitting: Family Medicine

## 2018-03-04 NOTE — Telephone Encounter (Signed)
Patient called office. Had + PPD screen. Will refer pt to [facility].

## 2018-03-22 ENCOUNTER — Ambulatory Visit: Payer: Medicare Other | Admitting: Certified Nurse Midwife

## 2018-03-23 ENCOUNTER — Encounter (INDEPENDENT_AMBULATORY_CARE_PROVIDER_SITE_OTHER): Payer: Self-pay

## 2018-04-01 ENCOUNTER — Ambulatory Visit: Payer: Medicare Other | Admitting: Certified Nurse Midwife

## 2018-04-05 ENCOUNTER — Ambulatory Visit: Payer: Medicare Other | Admitting: Certified Nurse Midwife

## 2018-04-06 ENCOUNTER — Encounter (INDEPENDENT_AMBULATORY_CARE_PROVIDER_SITE_OTHER): Payer: Self-pay

## 2018-04-19 ENCOUNTER — Ambulatory Visit: Payer: Medicare Other | Admitting: Certified Nurse Midwife

## 2018-04-27 ENCOUNTER — Encounter: Payer: Self-pay | Admitting: *Deleted

## 2018-05-12 ENCOUNTER — Other Ambulatory Visit: Payer: Self-pay

## 2018-05-12 ENCOUNTER — Inpatient Hospital Stay (HOSPITAL_COMMUNITY)
Admission: AD | Admit: 2018-05-12 | Discharge: 2018-05-12 | Disposition: A | Discharge status: Home / Self Care | Payer: Medicare Other | Source: Ambulatory Visit | Attending: Obstetrics and Gynecology | Admitting: Obstetrics and Gynecology

## 2018-05-12 ENCOUNTER — Encounter (HOSPITAL_COMMUNITY): Payer: Self-pay | Admitting: *Deleted

## 2018-05-12 ENCOUNTER — Inpatient Hospital Stay (HOSPITAL_COMMUNITY): Payer: Medicare Other

## 2018-05-12 DIAGNOSIS — O09211 Supervision of pregnancy with history of pre-term labor, first trimester: Secondary | ICD-10-CM | POA: Insufficient documentation

## 2018-05-12 DIAGNOSIS — F419 Anxiety disorder, unspecified: Secondary | ICD-10-CM | POA: Insufficient documentation

## 2018-05-12 DIAGNOSIS — O99511 Diseases of the respiratory system complicating pregnancy, first trimester: Secondary | ICD-10-CM | POA: Diagnosis not present

## 2018-05-12 DIAGNOSIS — O418X10 Other specified disorders of amniotic fluid and membranes, first trimester, not applicable or unspecified: Secondary | ICD-10-CM | POA: Diagnosis not present

## 2018-05-12 DIAGNOSIS — O209 Hemorrhage in early pregnancy, unspecified: Secondary | ICD-10-CM

## 2018-05-12 DIAGNOSIS — O98311 Other infections with a predominantly sexual mode of transmission complicating pregnancy, first trimester: Secondary | ICD-10-CM | POA: Insufficient documentation

## 2018-05-12 DIAGNOSIS — O99611 Diseases of the digestive system complicating pregnancy, first trimester: Secondary | ICD-10-CM | POA: Insufficient documentation

## 2018-05-12 DIAGNOSIS — O99341 Other mental disorders complicating pregnancy, first trimester: Secondary | ICD-10-CM | POA: Diagnosis not present

## 2018-05-12 DIAGNOSIS — A630 Anogenital (venereal) warts: Secondary | ICD-10-CM | POA: Insufficient documentation

## 2018-05-12 DIAGNOSIS — O468X1 Other antepartum hemorrhage, first trimester: Secondary | ICD-10-CM | POA: Diagnosis not present

## 2018-05-12 DIAGNOSIS — Z79899 Other long term (current) drug therapy: Secondary | ICD-10-CM | POA: Diagnosis not present

## 2018-05-12 DIAGNOSIS — J45909 Unspecified asthma, uncomplicated: Secondary | ICD-10-CM | POA: Insufficient documentation

## 2018-05-12 DIAGNOSIS — O3680X0 Pregnancy with inconclusive fetal viability, not applicable or unspecified: Secondary | ICD-10-CM

## 2018-05-12 DIAGNOSIS — F319 Bipolar disorder, unspecified: Secondary | ICD-10-CM | POA: Insufficient documentation

## 2018-05-12 LAB — CBC
HCT: 37.5 % (ref 36.0–46.0)
Hemoglobin: 11.8 g/dL — ABNORMAL LOW (ref 12.0–15.0)
MCH: 28.9 pg (ref 26.0–34.0)
MCHC: 31.5 g/dL (ref 30.0–36.0)
MCV: 91.9 fL (ref 78.0–100.0)
Platelets: 204 10*3/uL (ref 150–400)
RBC: 4.08 MIL/uL (ref 3.87–5.11)
RDW: 14.9 % (ref 11.5–15.5)
WBC: 6 10*3/uL (ref 4.0–10.5)

## 2018-05-12 LAB — URINALYSIS, ROUTINE W REFLEX MICROSCOPIC
Bacteria, UA: NONE SEEN
Bilirubin Urine: NEGATIVE
Glucose, UA: NEGATIVE mg/dL
Ketones, ur: NEGATIVE mg/dL
Nitrite: NEGATIVE
Protein, ur: NEGATIVE mg/dL
Specific Gravity, Urine: 1.028 (ref 1.005–1.030)
pH: 5 (ref 5.0–8.0)

## 2018-05-12 LAB — WET PREP, GENITAL
Clue Cells Wet Prep HPF POC: NONE SEEN
Sperm: NONE SEEN
Trich, Wet Prep: NONE SEEN
Yeast Wet Prep HPF POC: NONE SEEN

## 2018-05-12 LAB — POCT PREGNANCY, URINE: Preg Test, Ur: POSITIVE — AB

## 2018-05-12 LAB — HCG, QUANTITATIVE, PREGNANCY: hCG, Beta Chain, Quant, S: 70665 m[IU]/mL — ABNORMAL HIGH (ref <5)

## 2018-05-12 MED ORDER — PRENATAL VITAMINS 0.8 MG PO TABS: 1.0000 | ORAL_TABLET | Freq: Every day | ORAL | 12 refills | Status: DC

## 2018-05-12 NOTE — MAU Provider Note (Addendum)
Chief Complaint: Vaginal Bleeding and Possible Pregnancy   SUBJECTIVE HPI: [name] is a 32 y.o. [phone] at [redacted]w[redacted]d who presents to MAU presenting with intermittent bleeding for 3 days.  The bleeding was brownish and looked similar to spotting.  She denies any pelvic pain.  She reported to MAU to make sure she didn't have a miscarriage. She had a positive home pregnancy test 1-2 weeks ago.  Her LMP is April 03, 2018.  She missed her postpartum appointment to receive her birth control.   She plans to have prenatal care at [facility] in [place].  Prior Pregnancies Complications 1st pregnancy pre-eclampsia, pre-term 2nd pregnancy IOL due to pre-eclampsia  3rd pregnancy IOL due to pre-eclampsia   Medical Conditions Patient had postpartum depression noted on 02/23/2018 which as resolved.   Bipolar  Patient denies Fever, HA, N/V, SOB, CP, abd pain, hx of STI, abnormal BM  Medications Buspar 10mg  BID  Abilify 15mg  QD  Past Medical History:  Diagnosis Date  . Anxiety   . Asthma    inhaler PRN-hasn't used inhaler in months  . Attention deficit hyperactivity disorder   . Bipolar disorder [facility])    hospitalized as a teen for suicidal ideations and cutting  . Depression   . Gastritis   . GERD (gastroesophageal reflux disease)   . HPV (human papilloma virus) anogenital infection   . Hx of varicella   . Mild preeclampsia 02/09/2016  . Vaginal Pap smear, abnormal    OB History  Gravida Para Term Preterm AB Living  4 3 2 1  0 3  SAB TAB Ectopic Multiple Live Births  0     0 3    # Outcome Date GA Lbr Len/2nd Weight Sex Delivery Anes PTL Lv  4 Current           3 Term 02/19/18 [redacted]w[redacted]d 946:02 / 01:46 3.79 kg (8 lb 5.7 oz) M Vag-Spont EPI  LIV  2 Preterm 02/03/17 [redacted]w[redacted]d  3.4 kg (7 lb 7.9 oz) M Vag-Spont EPI  LIV     Birth Comments: pre eclampsia  1 Term 02/11/16 [redacted]w[redacted]d / 01:14 3.702 kg (8 lb 2.6 oz) F Vag-Spont EPI  LIV   Past Surgical History:  Procedure Laterality Date  . EYE MUSCLE  SURGERY Bilateral    07/29/12  . MOUTH SURGERY    . PLANTAR FASCIA SURGERY    . WISDOM TOOTH EXTRACTION     Social History   Socioeconomic History  . Marital status: Married    Spouse name: Not on file  . Number of children: 0  . Years of education: Not on file  . Highest education level: Not on file  Occupational History  . Not on file  Social Needs  . Financial resource strain: Not on file  . Food insecurity:    Worry: Not on file    Inability: Not on file  . Transportation needs:    Medical: Not on file    Non-medical: Not on file  Tobacco Use  . Smoking status: Never Smoker  . Smokeless tobacco: Never Used  Substance and Sexual Activity  . Alcohol use: No    Alcohol/week: 0.0 oz  . Drug use: No  . Sexual activity: Yes    Birth control/protection: None    Comment: last sex late January 2019  Lifestyle  . Physical activity:    Days per week: Not on file    Minutes per session: Not on file  . Stress: Not on file  Relationships  .  Social connections:    Talks on phone: Not on file    Gets together: Not on file    Attends religious service: Not on file    Active member of club or organization: Not on file    Attends meetings of clubs or organizations: Not on file    Relationship status: Not on file  . Intimate partner violence:    Fear of current or ex partner: Not on file    Emotionally abused: Not on file    Physically abused: Not on file    Forced sexual activity: Not on file  Other Topics Concern  . Not on file  Social History Narrative  . Not on file   No current facility-administered medications on file prior to encounter.    Current Outpatient Medications on File Prior to Encounter  Medication Sig Dispense Refill  . ibuprofen (ADVIL,MOTRIN) 600 MG tablet Take 1 tablet (600 mg total) by mouth every 6 (six) hours. 30 tablet 0  . Prenatal Vit-Fe Fumarate-FA (PRENATAL MULTIVITAMIN) TABS tablet Take 1 tablet at bedtime by mouth.      Allergies   Allergen Reactions  . Depakote [Divalproex Sodium] Other (See Comments)    Pt states that this medication makes her blood levels toxic.    [name] Methylphenidate Derivatives Other (See Comments)    Reaction:  Depression and anger   . Neurontin [Gabapentin] Other (See Comments)    Reaction:  Dizziness   . Prozac [Fluoxetine Hcl] Other (See Comments)    Reaction:  Anger     I have reviewed the past Medical Hx, Surgical Hx, Social Hx, Allergies and Medications.   REVIEW OF SYSTEMS All systems reviewed and are negative for acute change except as noted in the HPI.  Review of Systems  Constitutional: Negative for fever.  HENT: Negative.   Eyes: Negative.   Respiratory: Negative for shortness of breath.   Cardiovascular: Negative for chest pain and leg swelling.  Gastrointestinal: Negative for abdominal pain and vomiting.  Genitourinary: Negative for dysuria, flank pain, frequency and urgency.  Skin: Negative.   Neurological: Negative for dizziness and headaches.  Psychiatric/Behavioral: Negative for depression.     OBJECTIVE BP 114/61 (BP Location: Right Arm)   Pulse 67   Temp 98.5 F (36.9 C) (Oral)   Resp 16   Wt 85.3 kg (188 lb)   LMP 04/02/2018   SpO2 100%   BMI 33.30 kg/m    PHYSICAL EXAM Constitutional: Well-developed, well-nourished female in no acute distress.  Cardiovascular: normal rate and rhythm, pulses intact Respiratory: normal rate and effort.  GI: Patient refused exam MS: Extremities nontender, no edema, normal ROM Neurologic: Alert and oriented x 4. No focal deficits GU: Neg CVAT  PELVIC EXAM: Cervix pink, visually closed, without lesion, scant light red discharge, vaginal walls and external genitalia normal Bimanual exam: Cervix 0/long/high, firm, anterior, neg CMT, uterus nontender, enlarged to 5-6 week size, adnexa without tenderness, enlargement, or mass  SPECULUM EXAM: NEFG, brownish discharge, cervix clean BIMANUAL: cervix smooth no cervical  motion tenderness; uterus normal size for <[redacted] wks gestation, no adnexal tenderness or masses. No CMT.  Difficult to assess adnexa due to body habitus.  Psych: normal mood and affect  LAB RESULTS Results for orders placed or performed during the hospital encounter of 05/12/18 (from the past 24 hour(s))  Pregnancy, urine POC     Status: Abnormal   Collection Time: 05/12/18 12:52 PM  Result Value Ref Range   Preg Test, Ur POSITIVE (A) NEGATIVE  IMAGING No results found.  MAU Management/MDM: Vitals and nursing notes reviewed Orders Placed This Encounter  Procedures  . Urinalysis, Routine w reflex microscopic  . Pregnancy, urine POC    No orders of the defined types were placed in this encounter.  [name] is a 32 y.o. [phone] at [redacted]w[redacted]d who presents to MAU presenting with intermittent bleeding without pain for 3 days.  Plan to rule out ectopic pregnancy or spontaneous abortion with ultrasound and bhcg.  It is less likely patient has an infectious etiology for bleeding; she does not have a prior history of STIs, absent cervical motion tenderness, no dysuria, afebrile, but will send labs to test for gonorrhea, chlamydia, trichomonas, and UA for UTI.  Patient currently in stable condition, but will evaluate for anemia with CBC and Blood Type.  - CBC - Blood Type - Ultrasound - bhcg - UA, GC, Wet prep  Plan of care reviewed with patient, including labs and tests ordered and medical treatment.  Consult none.  Treatments in MAU included none.   ASSESSMENT IUP at 6 weeks +bhcg 70,665; ultrasound+ FHR 119bpm, neg wet prep  PLAN Discharge home in stable condition. Start pre-natal vitamins Patient will start prenatal care at [place] on return precautions     Allergies as of 05/12/2018      Reactions   Depakote [divalproex Sodium] Other (See Comments)   Pt states that this medication makes her blood levels toxic.     Methylphenidate Derivatives Other (See Comments)    Reaction:  Depression and anger    Neurontin [gabapentin] Other (See Comments)   Reaction:  Dizziness    Prozac [fluoxetine Hcl] Other (See Comments)   Reaction:  Anger       Medication List    STOP taking these medications   ibuprofen 600 MG tablet Commonly known as:  ADVIL,MOTRIN     TAKE these medications   ARIPiprazole 15 MG tablet Commonly known as:  ABILIFY Take 15 mg by mouth daily.   busPIRone 10 MG tablet Commonly known as:  BUSPAR Take 10 mg by mouth 2 (two) times daily.   prenatal multivitamin Tabs tablet Take 1 tablet at bedtime by mouth.   Prenatal Vitamins 0.8 MG tablet Take 1 tablet by mouth daily.       [name] Rival, Medical Student 05/12/2018, 1:07 PM I confirm that I have verified the information documented in the MS's note and that I have also personally reperformed the physical exam and all medical decision making activities.  See my note for official documentation Seabron Spates, CNM

## 2018-05-12 NOTE — MAU Note (Signed)
Brown bleeding began 4 days ago.   Patient states minor cramping.

## 2018-05-12 NOTE — MAU Note (Signed)
+  HPT 1-2 wks ago. (several).   Started bleeding 3days, off and on.  No pain.

## 2018-05-12 NOTE — Discharge Instructions (Signed)
Pelvic Rest Pelvic rest may be recommended if:  Your placenta is partially or completely covering the opening of your cervix (placenta previa).  There is bleeding between the wall of the uterus and the amniotic sac in the first trimester of pregnancy (subchorionic hemorrhage).  You went into labor too early (preterm labor).  Based on your overall health and the health of your baby, your health care provider will decide if pelvic rest is right for you. How do I rest my pelvis? For as long as told by your health care provider:  Do not have sex, sexual stimulation, or an orgasm.  Do not use tampons. Do not douche. Do not put anything in your vagina.  Do not lift anything that is heavier than 10 lb (4.5 kg).  Avoid activities that take a lot of effort (are strenuous).  Avoid any activity in which your pelvic muscles could become strained.  When should I seek medical care? Seek medical care if you have:  Cramping pain in your lower abdomen.  Vaginal discharge.  A low, dull backache.  Regular contractions.  Uterine tightening.  When should I seek immediate medical care? Seek immediate medical care if:  You have vaginal bleeding and you are pregnant.  This information is not intended to replace advice given to you by your health care provider. Make sure you discuss any questions you have with your health care provider. Document Released: 03/28/2011 Document Revised: 05/08/2016 Document Reviewed: 06/04/2015 Elsevier Interactive Patient Education  2018 [place] of Pregnancy The first trimester of pregnancy is from week 1 until the end of week 13 (months 1 through 3). During this time, your baby will begin to develop inside you. At 6-8 weeks, the eyes and face are formed, and the heartbeat can be seen on ultrasound. At the end of 12 weeks, all the baby's organs are formed. Prenatal care is all the medical care you receive before the birth of your baby. Make  sure you get good prenatal care and follow all of your doctor's instructions. Follow these instructions at home: Medicines  Take over-the-counter and prescription medicines only as told by your doctor. Some medicines are safe and some medicines are not safe during pregnancy.  Take a prenatal vitamin that contains at least 600 micrograms (mcg) of folic acid.  If you have trouble pooping (constipation), take medicine that will make your stool soft (stool softener) if your doctor approves. Eating and drinking  Eat regular, healthy meals.  Your doctor will tell you the amount of weight gain that is right for you.  Avoid raw meat and uncooked cheese.  If you feel sick to your stomach (nauseous) or throw up (vomit): ? Eat 4 or 5 small meals a day instead of 3 large meals. ? Try eating a few soda crackers. ? Drink liquids between meals instead of during meals.  To prevent constipation: ? Eat foods that are high in fiber, like fresh fruits and vegetables, whole grains, and beans. ? Drink enough fluids to keep your pee (urine) clear or pale yellow. Activity  Exercise only as told by your doctor. Stop exercising if you have cramps or pain in your lower belly (abdomen) or low back.  Do not exercise if it is too hot, too humid, or if you are in a place of great height (high altitude).  Try to avoid standing for long periods of time. Move your legs often if you must stand in one place for a long time.  Avoid heavy lifting.  Wear low-heeled shoes. Sit and stand up straight.  You can have sex unless your doctor tells you not to. Relieving pain and discomfort  Wear a good support bra if your breasts are sore.  Take warm water baths (sitz baths) to soothe pain or discomfort caused by hemorrhoids. Use hemorrhoid cream if your doctor says it is okay.  Rest with your legs raised if you have leg cramps or low back pain.  If you have puffy, bulging veins (varicose veins) in your legs: ? Wear  support hose or compression stockings as told by your doctor. ? Raise (elevate) your feet for 15 minutes, 3-4 times a day. ? Limit salt in your food. Prenatal care  Schedule your prenatal visits by the twelfth week of pregnancy.  Write down your questions. Take them to your prenatal visits.  Keep all your prenatal visits as told by your doctor. This is important. Safety  Wear your seat belt at all times when driving.  Make a list of emergency phone numbers. The list should include numbers for family, friends, the hospital, and police and fire departments. General instructions  Ask your doctor for a referral to a local prenatal class. Begin classes no later than at the start of month 6 of your pregnancy.  Ask for help if you need counseling or if you need help with nutrition. Your doctor can give you advice or tell you where to go for help.  Do not use hot tubs, steam rooms, or saunas.  Do not douche or use tampons or scented sanitary pads.  Do not cross your legs for long periods of time.  Avoid all herbs and alcohol. Avoid drugs that are not approved by your doctor.  Do not use any tobacco products, including cigarettes, chewing tobacco, and electronic cigarettes. If you need help quitting, ask your doctor. You may get counseling or other support to help you quit.  Avoid cat litter boxes and soil used by cats. These carry germs that can cause birth defects in the baby and can cause a loss of your baby (miscarriage) or stillbirth.  Visit your dentist. At home, brush your teeth with a soft toothbrush. Be gentle when you floss. Contact a doctor if:  You are dizzy.  You have mild cramps or pressure in your lower belly.  You have a nagging pain in your belly area.  You continue to feel sick to your stomach, you throw up, or you have watery poop (diarrhea).  You have a bad smelling fluid coming from your vagina.  You have pain when you pee (urinate).  You have increased  puffiness (swelling) in your face, hands, legs, or ankles. Get help right away if:  You have a fever.  You are leaking fluid from your vagina.  You have spotting or bleeding from your vagina.  You have very bad belly cramping or pain.  You gain or lose weight rapidly.  You throw up blood. It may look like coffee grounds.  You are around people who have [place] measles, fifth disease, or chickenpox.  You have a very bad headache.  You have shortness of breath.  You have any kind of trauma, such as from a fall or a car accident. Summary  The first trimester of pregnancy is from week 1 until the end of week 13 (months 1 through 3).  To take care of yourself and your unborn baby, you will need to eat healthy meals, take medicines only if your  doctor tells you to do so, and do activities that are safe for you and your baby.  Keep all follow-up visits as told by your doctor. This is important as your doctor will have to ensure that your baby is healthy and growing well. This information is not intended to replace advice given to you by your health care provider. Make sure you discuss any questions you have with your health care provider. Document Released: 05/19/2008 Document Revised: 12/09/2016 Document Reviewed: 12/09/2016 Elsevier Interactive Patient Education  2017 [employer].

## 2018-05-12 NOTE — MAU Provider Note (Signed)
Chief Complaint: Vaginal Bleeding and Possible Pregnancy   First Provider Initiated Contact with Patient 05/12/18 1326        SUBJECTIVE HPI: [name] is a 32 y.o. [id number] at [redacted]w[redacted]d by LMP who presents to maternity admissions reporting bleeding for 3 days. Had a positive UPT at home.  LMP April 20, sure.  Just delivered in March.   Missed postpartum visit due to baby having an appointment same day and by the time she got rescheduled, she was pregnant again.  States did use condom, "but I guess it broke".    Has no pain   She denies vaginal itching/burning, urinary symptoms, h/a, dizziness, n/v, or fever/chills.    States CPS closed her case when baby was 46 days old.  Has support from social services and the volunteer parent to parent group.  Has baby with her today. She and husband seem to be very attentive to baby.  Vaginal Bleeding  The patient's primary symptoms include vaginal bleeding. The patient's pertinent negatives include no genital itching, genital lesions, genital odor or pelvic pain. This is a new problem. The current episode started in the past 7 days. The problem occurs constantly. The problem has been unchanged. The patient is experiencing no pain. She is pregnant. Pertinent negatives include no abdominal pain, chills, constipation, diarrhea, fever, nausea or vomiting. The vaginal discharge was bloody. The vaginal bleeding is spotting. She has not been passing clots. She has not been passing tissue. Nothing aggravates the symptoms.  Possible Pregnancy  This is a new problem. The current episode started in the past 7 days. The problem has been unchanged. Pertinent negatives include no abdominal pain, chills, fever, nausea or vomiting. Nothing aggravates the symptoms. She has tried nothing for the symptoms.   RN Note; +HPT 1-2 wks ago. (several).   Started bleeding 3days, off and on.  No pain    Past Medical History:  Diagnosis Date  . Anxiety   . Asthma    inhaler  PRN-hasn't used inhaler in months  . Attention deficit hyperactivity disorder   . Bipolar disorder [facility])    hospitalized as a teen for suicidal ideations and cutting  . Depression   . Gastritis   . GERD (gastroesophageal reflux disease)   . HPV (human papilloma virus) anogenital infection   . Hx of varicella   . Mild preeclampsia 02/09/2016  . Vaginal Pap smear, abnormal    Past Surgical History:  Procedure Laterality Date  . EYE MUSCLE SURGERY Bilateral    07/29/12  . MOUTH SURGERY    . PLANTAR FASCIA SURGERY    . WISDOM TOOTH EXTRACTION     Social History   Socioeconomic History  . Marital status: Married    Spouse name: Not on file  . Number of children: 0  . Years of education: Not on file  . Highest education level: Not on file  Occupational History  . Not on file  Social Needs  . Financial resource strain: Not on file  . Food insecurity:    Worry: Not on file    Inability: Not on file  . Transportation needs:    Medical: Not on file    Non-medical: Not on file  Tobacco Use  . Smoking status: Never Smoker  . Smokeless tobacco: Never Used  Substance and Sexual Activity  . Alcohol use: No    Alcohol/week: 0.0 oz  . Drug use: No  . Sexual activity: Yes    Birth control/protection: None  Comment: last sex late January 2019  Lifestyle  . Physical activity:    Days per week: Not on file    Minutes per session: Not on file  . Stress: Not on file  Relationships  . Social connections:    Talks on phone: Not on file    Gets together: Not on file    Attends religious service: Not on file    Active member of club or organization: Not on file    Attends meetings of clubs or organizations: Not on file    Relationship status: Not on file  . Intimate partner violence:    Fear of current or ex partner: Not on file    Emotionally abused: Not on file    Physically abused: Not on file    Forced sexual activity: Not on file  Other Topics Concern  . Not on file   Social History Narrative  . Not on file   No current facility-administered medications on file prior to encounter.    Current Outpatient Medications on File Prior to Encounter  Medication Sig Dispense Refill  . ARIPiprazole (ABILIFY) 15 MG tablet Take 15 mg by mouth daily.    . busPIRone (BUSPAR) 10 MG tablet Take 10 mg by mouth 2 (two) times daily.    [name] ibuprofen (ADVIL,MOTRIN) 600 MG tablet Take 1 tablet (600 mg total) by mouth every 6 (six) hours. 30 tablet 0  . Prenatal Vit-Fe Fumarate-FA (PRENATAL MULTIVITAMIN) TABS tablet Take 1 tablet at bedtime by mouth.      Allergies  Allergen Reactions  . Depakote [Divalproex Sodium] Other (See Comments)    Pt states that this medication makes her blood levels toxic.    [name] Methylphenidate Derivatives Other (See Comments)    Reaction:  Depression and anger   . Neurontin [Gabapentin] Other (See Comments)    Reaction:  Dizziness   . Prozac [Fluoxetine Hcl] Other (See Comments)    Reaction:  Anger     I have reviewed patient's Past Medical Hx, Surgical Hx, Family Hx, Social Hx, medications and allergies.   ROS:  Review of Systems  Constitutional: Negative for chills and fever.  Gastrointestinal: Negative for abdominal pain, constipation, diarrhea, nausea and vomiting.  Genitourinary: Positive for vaginal bleeding. Negative for pelvic pain.   Review of Systems  Other systems negative   Physical Exam  Physical Exam Patient Vitals for the past 24 hrs:  BP Temp Temp src Pulse Resp SpO2 Weight  05/12/18 1253 114/61 98.5 F (36.9 C) Oral 67 16 100 % 188 lb (85.3 kg)   Constitutional: Well-developed, well-nourished female in no acute distress.  Cardiovascular: normal rate Respiratory: normal effort GI: Abd soft, non-tender. Pos BS x 4 MS: Extremities nontender, no edema, normal ROM Neurologic: Alert and oriented x 4.  GU: Neg CVAT.  PELVIC EXAM: Cervix pink, visually closed, without lesion, scant light red discharge, vaginal walls  and external genitalia normal Bimanual exam: Cervix 0/long/high, firm, anterior, neg CMT, uterus nontender, enlarged to 5-6 week size, adnexa without tenderness, enlargement, or mass   LAB RESULTS Results for orders placed or performed during the hospital encounter of 05/12/18 (from the past 24 hour(s))  Urinalysis, Routine w reflex microscopic     Status: Abnormal   Collection Time: 05/12/18 12:40 PM  Result Value Ref Range   Color, Urine YELLOW YELLOW   APPearance HAZY (A) CLEAR   Specific Gravity, Urine 1.028 1.005 - 1.030   pH 5.0 5.0 - 8.0   Glucose, UA NEGATIVE  NEGATIVE mg/dL   Hgb urine dipstick MODERATE (A) NEGATIVE   Bilirubin Urine NEGATIVE NEGATIVE   Ketones, ur NEGATIVE NEGATIVE mg/dL   Protein, ur NEGATIVE NEGATIVE mg/dL   Nitrite NEGATIVE NEGATIVE   Leukocytes, UA TRACE (A) NEGATIVE   RBC / HPF 0-5 0 - 5 RBC/hpf   WBC, UA 0-5 0 - 5 WBC/hpf   Bacteria, UA NONE SEEN NONE SEEN   Squamous Epithelial / LPF 6-10 0 - 5   Mucus PRESENT    Hyaline Casts, UA PRESENT   Pregnancy, urine POC     Status: Abnormal   Collection Time: 05/12/18 12:52 PM  Result Value Ref Range   Preg Test, Ur POSITIVE (A) NEGATIVE  Wet prep, genital     Status: Abnormal   Collection Time: 05/12/18  1:55 PM  Result Value Ref Range   Yeast Wet Prep HPF POC NONE SEEN NONE SEEN   Trich, Wet Prep NONE SEEN NONE SEEN   Clue Cells Wet Prep HPF POC NONE SEEN NONE SEEN   WBC, Wet Prep HPF POC MANY (A) NONE SEEN   Sperm NONE SEEN   CBC     Status: Abnormal   Collection Time: 05/12/18  2:33 PM  Result Value Ref Range   WBC 6.0 4.0 - 10.5 K/uL   RBC 4.08 3.87 - 5.11 MIL/uL   Hemoglobin 11.8 (L) 12.0 - 15.0 g/dL   HCT 37.5 36.0 - 46.0 %   MCV 91.9 78.0 - 100.0 fL   MCH 28.9 26.0 - 34.0 pg   MCHC 31.5 30.0 - 36.0 g/dL   RDW 14.9 11.5 - 15.5 %   Platelets 204 150 - 400 K/uL  hCG, quantitative, pregnancy     Status: Abnormal   Collection Time: 05/12/18  2:33 PM  Result Value Ref Range   hCG, Beta  Chain, Quant, S 70,665 (H) <5 mIU/mL    --/--/O POS (03/08 0740)  IMAGING US Ob Comp Less 14 Wks  Result Date: 05/12/2018 CLINICAL DATA:  Bleeding. EXAM: OBSTETRIC <14 WK [place] AND TRANSVAGINAL OB US TECHNIQUE: Both transabdominal and transvaginal ultrasound examinations were performed for complete evaluation of the gestation as well as the maternal uterus, adnexal regions, and pelvic cul-de-sac. Transvaginal technique was performed to assess early pregnancy. COMPARISON:  None FINDINGS: Intrauterine gestational sac: Single Yolk sac:  Visualized. Embryo:  Visualized. Cardiac Activity: Visualized. Heart Rate: 119  bpm CRL:  3.3  mm   5 w   6 d                  [place] EDC: 01/06/2019 Subchorionic hemorrhage: Small chronic appearing subchorionic hemorrhage identified containing small internal cystic areas noted. Maternal uterus/adnexae: Right ovary: Normal Left ovary: Normal Other :None Free fluid:  None IMPRESSION: 1. Single living intrauterine gestation with an estimated gestational age of [redacted] weeks and 6 days. 2. Small subchorionic hemorrhage. Electronically Signed   By: Kerby Moors M.D.   On: 05/12/2018 16:09   US Ob Transvaginal  Result Date: 05/12/2018 CLINICAL DATA:  Bleeding. EXAM: OBSTETRIC <14 WK [place] AND TRANSVAGINAL OB US TECHNIQUE: Both transabdominal and transvaginal ultrasound examinations were performed for complete evaluation of the gestation as well as the maternal uterus, adnexal regions, and pelvic cul-de-sac. Transvaginal technique was performed to assess early pregnancy. COMPARISON:  None FINDINGS: Intrauterine gestational sac: Single Yolk sac:  Visualized. Embryo:  Visualized. Cardiac Activity: Visualized. Heart Rate: 119  bpm CRL:  3.3  mm   5 w   6 d  [place] EDC: 01/06/2019 Subchorionic hemorrhage: Small chronic appearing subchorionic hemorrhage identified containing small internal cystic areas noted. Maternal uterus/adnexae: Right ovary: Normal Left ovary: Normal Other :None  Free fluid:  None IMPRESSION: 1. Single living intrauterine gestation with an estimated gestational age of [redacted] weeks and 6 days. 2. Small subchorionic hemorrhage. Electronically Signed   By: Kerby Moors M.D.   On: 05/12/2018 16:09    MAU Management/MDM: Ordered usual first trimester r/o ectopic labs.   Pelvic exam and cultures done Will check baseline Ultrasound to rule out ectopic.   [place] finds a live single IUP  This bleeding/pain can represent a normal pregnancy with bleeding, spontaneous abortion or even an ectopic which can be life-threatening.  The process as listed above helps to determine which of these is present.  Patient is thrilled with the news of a live baby.  Pictures given.  Encouraged to arrange care soon, Message sent to clinic for appt.  Patient is adamantly opposed to going back to [facility], states they "judge me and I don't like that".    ASSESSMENT Pregnancy at [redacted]w[redacted]d Bleeding in early pregnancy Pregnancy of unknown anatomic location Confirmed single live intrauterine pregnancy  PLAN Discharge home Pelvic rest with explanation of what that means Establish prenatal care (see above)  Pt stable at time of discharge. Encouraged to return here or to other Urgent Care/ED if she develops worsening of symptoms, increase in pain, fever, or other concerning symptoms.    Hansel Feinstein CNM, MSN Certified Nurse-Midwife 05/12/2018  1:26 PM

## 2018-05-13 LAB — GC/CHLAMYDIA PROBE AMP (~~LOC~~) NOT AT ARMC
Chlamydia: NEGATIVE
Neisseria Gonorrhea: NEGATIVE

## 2018-05-13 LAB — HIV ANTIBODY (ROUTINE TESTING W REFLEX): HIV Screen 4th Generation wRfx: NONREACTIVE

## 2018-06-03 ENCOUNTER — Inpatient Hospital Stay (HOSPITAL_COMMUNITY)
Admission: AD | Admit: 2018-06-03 | Discharge: 2018-06-03 | Discharge status: Against Medical Advice | Payer: Medicare Other | Source: Ambulatory Visit | Attending: Obstetrics & Gynecology | Admitting: Obstetrics & Gynecology

## 2018-06-03 ENCOUNTER — Encounter (HOSPITAL_COMMUNITY): Payer: Self-pay

## 2018-06-03 DIAGNOSIS — J45909 Unspecified asthma, uncomplicated: Secondary | ICD-10-CM | POA: Insufficient documentation

## 2018-06-03 DIAGNOSIS — IMO0001 Reserved for inherently not codable concepts without codable children: Secondary | ICD-10-CM

## 2018-06-03 DIAGNOSIS — Z800 Family history of malignant neoplasm of digestive organs: Secondary | ICD-10-CM | POA: Diagnosis not present

## 2018-06-03 DIAGNOSIS — Z9889 Other specified postprocedural states: Secondary | ICD-10-CM | POA: Diagnosis not present

## 2018-06-03 DIAGNOSIS — Z818 Family history of other mental and behavioral disorders: Secondary | ICD-10-CM | POA: Diagnosis not present

## 2018-06-03 DIAGNOSIS — O99341 Other mental disorders complicating pregnancy, first trimester: Secondary | ICD-10-CM | POA: Diagnosis not present

## 2018-06-03 DIAGNOSIS — O09891 Supervision of other high risk pregnancies, first trimester: Secondary | ICD-10-CM

## 2018-06-03 DIAGNOSIS — R109 Unspecified abdominal pain: Secondary | ICD-10-CM | POA: Insufficient documentation

## 2018-06-03 DIAGNOSIS — F419 Anxiety disorder, unspecified: Secondary | ICD-10-CM | POA: Insufficient documentation

## 2018-06-03 DIAGNOSIS — K219 Gastro-esophageal reflux disease without esophagitis: Secondary | ICD-10-CM | POA: Insufficient documentation

## 2018-06-03 DIAGNOSIS — Z79899 Other long term (current) drug therapy: Secondary | ICD-10-CM | POA: Diagnosis not present

## 2018-06-03 DIAGNOSIS — Z3A08 8 weeks gestation of pregnancy: Secondary | ICD-10-CM

## 2018-06-03 DIAGNOSIS — O209 Hemorrhage in early pregnancy, unspecified: Secondary | ICD-10-CM

## 2018-06-03 DIAGNOSIS — O468X1 Other antepartum hemorrhage, first trimester: Secondary | ICD-10-CM

## 2018-06-03 DIAGNOSIS — O99511 Diseases of the respiratory system complicating pregnancy, first trimester: Secondary | ICD-10-CM | POA: Diagnosis not present

## 2018-06-03 DIAGNOSIS — O99611 Diseases of the digestive system complicating pregnancy, first trimester: Secondary | ICD-10-CM | POA: Insufficient documentation

## 2018-06-03 DIAGNOSIS — Z888 Allergy status to other drugs, medicaments and biological substances status: Secondary | ICD-10-CM | POA: Diagnosis not present

## 2018-06-03 DIAGNOSIS — F319 Bipolar disorder, unspecified: Secondary | ICD-10-CM | POA: Diagnosis not present

## 2018-06-03 DIAGNOSIS — O26891 Other specified pregnancy related conditions, first trimester: Secondary | ICD-10-CM | POA: Insufficient documentation

## 2018-06-03 DIAGNOSIS — O418X10 Other specified disorders of amniotic fluid and membranes, first trimester, not applicable or unspecified: Secondary | ICD-10-CM

## 2018-06-03 LAB — CBC
HCT: 33.9 % — ABNORMAL LOW (ref 36.0–46.0)
Hemoglobin: 11 g/dL — ABNORMAL LOW (ref 12.0–15.0)
MCH: 29 pg (ref 26.0–34.0)
MCHC: 32.4 g/dL (ref 30.0–36.0)
MCV: 89.4 fL (ref 78.0–100.0)
Platelets: 157 10*3/uL (ref 150–400)
RBC: 3.79 MIL/uL — ABNORMAL LOW (ref 3.87–5.11)
RDW: 14.5 % (ref 11.5–15.5)
WBC: 7 10*3/uL (ref 4.0–10.5)

## 2018-06-03 LAB — WET PREP, GENITAL
Clue Cells Wet Prep HPF POC: NONE SEEN
Sperm: NONE SEEN
Trich, Wet Prep: NONE SEEN
Yeast Wet Prep HPF POC: NONE SEEN

## 2018-06-03 LAB — URINALYSIS, ROUTINE W REFLEX MICROSCOPIC
Bilirubin Urine: NEGATIVE
Glucose, UA: NEGATIVE mg/dL
Ketones, ur: NEGATIVE mg/dL
Leukocytes, UA: NEGATIVE
Nitrite: NEGATIVE
Protein, ur: NEGATIVE mg/dL
Specific Gravity, Urine: 1.025 (ref 1.005–1.030)
pH: 5 (ref 5.0–8.0)

## 2018-06-03 NOTE — MAU Provider Note (Signed)
History     CSN: [id number]  Arrival date and time: 06/03/18 1330   First Provider Initiated Contact with Patient 06/03/18 1403      Chief Complaint  Patient presents with  . Vaginal Bleeding   HPI  [name] is a [id number] at [redacted]w[redacted]d by 5 week + 3 days [place] who presents to MAU with chief complaint of abdominal cramping and vaginal bleeding x three days. Denies fever, recent illness, falls, headache. Reports most recent sexual intercourse was more than one month ago. S/p SVD 02/19/2018.  OB History    Gravida  4   Para  3   Term  2   Preterm  1   AB  0   Living  3     SAB  0   TAB      Ectopic      Multiple  0   Live Births  3           Past Medical History:  Diagnosis Date  . Anxiety   . Asthma    inhaler PRN-hasn't used inhaler in months  . Attention deficit hyperactivity disorder   . Bipolar disorder [facility])    hospitalized as a teen for suicidal ideations and cutting  . Depression   . Gastritis   . GERD (gastroesophageal reflux disease)   . HPV (human papilloma virus) anogenital infection   . Hx of varicella   . Mild preeclampsia 02/09/2016  . Vaginal Pap smear, abnormal     Past Surgical History:  Procedure Laterality Date  . EYE MUSCLE SURGERY Bilateral    07/29/12  . MOUTH SURGERY    . PLANTAR FASCIA SURGERY    . WISDOM TOOTH EXTRACTION      Family History  Problem Relation Age of Onset  . Depression Mother   . Colon cancer Mother   . Colon polyps Mother   . Depression Father   . Depression Brother   . Liver disease Unknown   . Kidney disease Unknown     Social History   Tobacco Use  . Smoking status: Never Smoker  . Smokeless tobacco: Never Used  Substance Use Topics  . Alcohol use: No    Alcohol/week: 0.0 oz  . Drug use: No    Allergies:  Allergies  Allergen Reactions  . Depakote [Divalproex Sodium] Other (See Comments)    Pt states that this medication makes her blood levels toxic.    [name] Methylphenidate  Derivatives Other (See Comments)    Reaction:  Depression and anger   . Neurontin [Gabapentin] Other (See Comments)    Reaction:  Dizziness   . Prozac [Fluoxetine Hcl] Other (See Comments)    Reaction:  Anger     Medications Prior to Admission  Medication Sig Dispense Refill Last Dose  . ARIPiprazole (ABILIFY) 15 MG tablet Take 15 mg by mouth daily.     . busPIRone (BUSPAR) 10 MG tablet Take 10 mg by mouth 2 (two) times daily.     . Prenatal Multivit-Min-Fe-FA (PRENATAL VITAMINS) 0.8 MG tablet Take 1 tablet by mouth daily. 30 tablet 12   . Prenatal Vit-Fe Fumarate-FA (PRENATAL MULTIVITAMIN) TABS tablet Take 1 tablet at bedtime by mouth.    Past Week at Unknown time    Review of Systems  Constitutional: Negative for activity change and appetite change.  Respiratory: Negative for shortness of breath and wheezing.   Gastrointestinal: Positive for abdominal pain. Negative for nausea and vomiting.  Genitourinary: Positive for vaginal bleeding. Negative  for vaginal discharge and vaginal pain.  Neurological: Negative for syncope, weakness, light-headedness and headaches.   Physical Exam   Temperature 98.9 F (37.2 C), temperature source Oral, resp. rate 18, last menstrual period 04/02/2018, unknown if currently breastfeeding.  Physical Exam  Nursing note and vitals reviewed. Constitutional: She is oriented to person, place, and time. She appears well-developed and well-nourished.  Cardiovascular: Normal rate, regular rhythm, normal heart sounds and intact distal pulses.  Respiratory: Effort normal and breath sounds normal.  GI: Soft. Bowel sounds are normal. She exhibits no distension and no mass. There is no tenderness. There is no rebound and no guarding.  Genitourinary: Uterus normal. Cervix exhibits no motion tenderness and no friability. Right adnexum displays no mass, no tenderness and no fullness. Left adnexum displays no mass, no tenderness and no fullness. There is bleeding in the  vagina. No erythema or tenderness in the vagina. No foreign body in the vagina. No vaginal discharge found.  Genitourinary Comments: Dark brown serosanguinous discharge visualized on bimanual exam  Musculoskeletal: Normal range of motion.  Neurological: She is alert and oriented to person, place, and time. She has normal reflexes.  Skin: Skin is warm and dry.  Psychiatric: She has a normal mood and affect. Her behavior is normal. Judgment and thought content normal.    MAU Course  Procedures  MDM Orders Placed This Encounter  Procedures  . Wet prep, genital    Standing Status:   Standing    Number of Occurrences:   1  . Urinalysis, Routine w reflex microscopic    Standing Status:   Standing    Number of Occurrences:   1  . CBC    Standing Status:   Standing    Number of Occurrences:   1   Positive fetal heart tone on bedside ultrasound performed by M. Drake Leach, CNM Hemodynamically stable Small subchorionic hemorrhage identified on 5w+6d [place] Hgb 11.0 today down from 11.8 on 05/12/18  Assessment and Plan  -32 y.o. [id number] at [redacted]w[redacted]d by [place] -FHT visualized -Discussed pelvic rest and possible future episodes of bleeding r/t previous diagnosis subchorionic hemorrhage  -Pt to establish prenatal care  Patient left AMA before all labs resulted.  - Pt left before discussion of Hgb of 11.0, dietary sources of iron - Unable to confirm if patient taking PNV including iron  Darlina Rumpf, CNM 06/03/2018, 2:54 PM

## 2018-06-03 NOTE — Progress Notes (Signed)
Pt at nursing statin asking to speak to charge nurse.  RN to [facility] to discuss concerns.  Pt requesting taxi voucher because she doesn't want to ask for a ride in the rain and she can not ride the bus.  Bus voucher offered, pt refused.  Pt states "since I got my [place] and know my baby is okay can I leave?"  Pt told she needed to stay to receive test results and discuss POC with CNM.  Pt refuses and asks to "sign that paper" so she can leave.  AMA papers reviewed, pt educated on risk of leaving without reviewing results, papers signed.

## 2018-06-03 NOTE — Progress Notes (Signed)
Limited bedside US: viable, active fetus, +cardiac activity, subj. nml AFV

## 2018-06-03 NOTE — MAU Note (Signed)
Pt is a G4P3 at 8wks c/o Vb for 3 days.  No cramping, h/o subchorionic hem.

## 2018-06-04 LAB — GC/CHLAMYDIA PROBE AMP (~~LOC~~) NOT AT ARMC
Chlamydia: NEGATIVE
Neisseria Gonorrhea: NEGATIVE

## 2018-06-15 ENCOUNTER — Encounter: Payer: Medicare Other | Admitting: Obstetrics and Gynecology

## 2018-06-18 ENCOUNTER — Other Ambulatory Visit: Payer: Self-pay | Admitting: Obstetrics and Gynecology

## 2018-06-18 ENCOUNTER — Encounter: Payer: Self-pay | Admitting: Obstetrics and Gynecology

## 2018-06-18 MED ORDER — DOXYLAMINE-PYRIDOXINE 10-10 MG PO TBEC: 2.0000 | DELAYED_RELEASE_TABLET | Freq: Every day | ORAL | 5 refills | Status: DC

## 2018-06-18 NOTE — Progress Notes (Signed)
Diclegis sent to pharmacy.

## 2018-06-22 ENCOUNTER — Encounter: Payer: Medicare Other | Admitting: Obstetrics and Gynecology

## 2018-07-13 ENCOUNTER — Other Ambulatory Visit (HOSPITAL_COMMUNITY)
Admission: RE | Admit: 2018-07-13 | Discharge: 2018-07-13 | Disposition: A | Discharge status: Home / Self Care | Payer: Medicare Other | Source: Ambulatory Visit | Attending: Obstetrics & Gynecology | Admitting: Obstetrics & Gynecology

## 2018-07-13 ENCOUNTER — Encounter: Payer: Self-pay | Admitting: Obstetrics & Gynecology

## 2018-07-13 ENCOUNTER — Encounter: Payer: Self-pay | Admitting: Advanced Practice Midwife

## 2018-07-13 ENCOUNTER — Ambulatory Visit (INDEPENDENT_AMBULATORY_CARE_PROVIDER_SITE_OTHER): Payer: Medicare Other | Admitting: Obstetrics & Gynecology

## 2018-07-13 VITALS — BP 112/73 | HR 69 | Wt 180.4 lb

## 2018-07-13 DIAGNOSIS — Z3483 Encounter for supervision of other normal pregnancy, third trimester: Secondary | ICD-10-CM

## 2018-07-13 DIAGNOSIS — O09293 Supervision of pregnancy with other poor reproductive or obstetric history, third trimester: Secondary | ICD-10-CM

## 2018-07-13 DIAGNOSIS — O26892 Other specified pregnancy related conditions, second trimester: Secondary | ICD-10-CM | POA: Insufficient documentation

## 2018-07-13 DIAGNOSIS — N898 Other specified noninflammatory disorders of vagina: Secondary | ICD-10-CM | POA: Diagnosis present

## 2018-07-13 DIAGNOSIS — O09299 Supervision of pregnancy with other poor reproductive or obstetric history, unspecified trimester: Secondary | ICD-10-CM

## 2018-07-13 DIAGNOSIS — Z3A14 14 weeks gestation of pregnancy: Secondary | ICD-10-CM | POA: Diagnosis not present

## 2018-07-13 DIAGNOSIS — Z3490 Encounter for supervision of normal pregnancy, unspecified, unspecified trimester: Secondary | ICD-10-CM

## 2018-07-13 DIAGNOSIS — O0990 Supervision of high risk pregnancy, unspecified, unspecified trimester: Secondary | ICD-10-CM | POA: Insufficient documentation

## 2018-07-13 DIAGNOSIS — Z3492 Encounter for supervision of normal pregnancy, unspecified, second trimester: Secondary | ICD-10-CM | POA: Diagnosis present

## 2018-07-13 MED ORDER — ASPIRIN EC 81 MG PO TBEC: 81.0000 mg | DELAYED_RELEASE_TABLET | Freq: Every day | ORAL | 2 refills | Status: DC

## 2018-07-13 MED ORDER — PRENATAL MULTIVITAMIN CH: 1.0000 | ORAL_TABLET | Freq: Every day | ORAL | 11 refills | Status: DC
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
## 2018-07-13 NOTE — Progress Notes (Signed)
Pt presents for NOB work out today. Pt delivered 02/19/2018 IOL due to preeclampsia No PP exam for this pregnancy.   Last Pap: 03/03/2014 WNL Declines pap  / pelvic exam  Pt wants Genetic testing.  Pt c/o spotting.

## 2018-07-13 NOTE — Patient Instructions (Signed)
Second Trimester of Pregnancy The second trimester is from week 13 through week 28, month 4 through 6. This is often the time in pregnancy that you feel your best. Often times, morning sickness has lessened or quit. You may have more energy, and you may get hungry more often. Your unborn baby (fetus) is growing rapidly. At the end of the sixth month, he or she is about 9 inches long and weighs about 1 pounds. You will likely feel the baby move (quickening) between 18 and 20 weeks of pregnancy. Follow these instructions at home:  Avoid all smoking, herbs, and alcohol. Avoid drugs not approved by your doctor.  Do not use any tobacco products, including cigarettes, chewing tobacco, and electronic cigarettes. If you need help quitting, ask your doctor. You may get counseling or other support to help you quit.  Only take medicine as told by your doctor. Some medicines are safe and some are not during pregnancy.  Exercise only as told by your doctor. Stop exercising if you start having cramps.  Eat regular, healthy meals.  Wear a good support bra if your breasts are tender.  Do not use hot tubs, steam rooms, or saunas.  Wear your seat belt when driving.  Avoid raw meat, uncooked cheese, and liter boxes and soil used by cats.  Take your prenatal vitamins.  Take 1500-2000 milligrams of calcium daily starting at the 20th week of pregnancy until you deliver your baby.  Try taking medicine that helps you poop (stool softener) as needed, and if your doctor approves. Eat more fiber by eating fresh fruit, vegetables, and whole grains. Drink enough fluids to keep your pee (urine) clear or pale yellow.  Take warm water baths (sitz baths) to soothe pain or discomfort caused by hemorrhoids. Use hemorrhoid cream if your doctor approves.  If you have puffy, bulging veins (varicose veins), wear support hose. Raise (elevate) your feet for 15 minutes, 3-4 times a day. Limit salt in your diet.  Avoid heavy  lifting, wear low heals, and sit up straight.  Rest with your legs raised if you have leg cramps or low back pain.  Visit your dentist if you have not gone during your pregnancy. Use a soft toothbrush to brush your teeth. Be gentle when you floss.  You can have sex (intercourse) unless your doctor tells you not to.  Go to your doctor visits. Get help if:  You feel dizzy.  You have mild cramps or pressure in your lower belly (abdomen).  You have a nagging pain in your belly area.  You continue to feel sick to your stomach (nauseous), throw up (vomit), or have watery poop (diarrhea).  You have bad smelling fluid coming from your vagina.  You have pain with peeing (urination). Get help right away if:  You have a fever.  You are leaking fluid from your vagina.  You have spotting or bleeding from your vagina.  You have severe belly cramping or pain.  You lose or gain weight rapidly.  You have trouble catching your breath and have chest pain.  You notice sudden or extreme puffiness (swelling) of your face, hands, ankles, feet, or legs.  You have not felt the baby move in over an hour.  You have severe headaches that do not go away with medicine.  You have vision changes. This information is not intended to replace advice given to you by your health care provider. Make sure you discuss any questions you have with your health care  provider. Document Released: 02/25/2010 Document Revised: 05/08/2016 Document Reviewed: 02/01/2013 Elsevier Interactive Patient Education  2017 Elsevier Inc.  

## 2018-07-14 ENCOUNTER — Encounter: Payer: Self-pay | Admitting: Obstetrics & Gynecology

## 2018-07-14 LAB — PROTEIN / CREATININE RATIO, URINE
Creatinine, Urine: 197.1 mg/dL
Protein, Ur: 15.9 mg/dL
Protein/Creat Ratio: 81 mg/g creat (ref 0–200)

## 2018-07-14 LAB — CERVICOVAGINAL ANCILLARY ONLY
Bacterial vaginitis: NEGATIVE
Candida vaginitis: NEGATIVE
Chlamydia: NEGATIVE
Neisseria Gonorrhea: NEGATIVE
Trichomonas: NEGATIVE

## 2018-07-16 ENCOUNTER — Encounter: Payer: Self-pay | Admitting: Obstetrics & Gynecology

## 2018-07-16 LAB — COMPREHENSIVE METABOLIC PANEL
ALT: 8 IU/L (ref 0–32)
AST: 13 IU/L (ref 0–40)
Albumin/Globulin Ratio: 1.7 (ref 1.2–2.2)
Albumin: 4.2 g/dL (ref 3.5–5.5)
Alkaline Phosphatase: 60 IU/L (ref 39–117)
BUN/Creatinine Ratio: 13 (ref 9–23)
BUN: 9 mg/dL (ref 6–20)
Bilirubin Total: 0.2 mg/dL (ref 0.0–1.2)
CO2: 23 mmol/L (ref 20–29)
Calcium: 9.4 mg/dL (ref 8.7–10.2)
Chloride: 102 mmol/L (ref 96–106)
Creatinine, Ser: 0.72 mg/dL (ref 0.57–1.00)
GFR calc Af Amer: 129 mL/min/{1.73_m2} (ref >59)
GFR calc non Af Amer: 112 mL/min/{1.73_m2} (ref >59)
Globulin, Total: 2.5 g/dL (ref 1.5–4.5)
Glucose: 82 mg/dL (ref 65–99)
Potassium: 4.3 mmol/L (ref 3.5–5.2)
Sodium: 142 mmol/L (ref 134–144)
Total Protein: 6.7 g/dL (ref 6.0–8.5)

## 2018-07-16 LAB — OBSTETRIC PANEL, INCLUDING HIV
Antibody Screen: NEGATIVE
Basophils Absolute: 0 10*3/uL (ref 0.0–0.2)
Basos: 0 %
EOS (ABSOLUTE): 0 10*3/uL (ref 0.0–0.4)
Eos: 0 %
HIV Screen 4th Generation wRfx: NONREACTIVE
Hematocrit: 38.4 % (ref 34.0–46.6)
Hemoglobin: 12.7 g/dL (ref 11.1–15.9)
Hepatitis B Surface Ag: NEGATIVE
Immature Grans (Abs): 0 10*3/uL (ref 0.0–0.1)
Immature Granulocytes: 0 %
Lymphocytes Absolute: 1.6 10*3/uL (ref 0.7–3.1)
Lymphs: 28 %
MCH: 29.5 pg (ref 26.6–33.0)
MCHC: 33.1 g/dL (ref 31.5–35.7)
MCV: 89 fL (ref 79–97)
Monocytes Absolute: 0.2 10*3/uL (ref 0.1–0.9)
Monocytes: 3 %
Neutrophils Absolute: 4.1 10*3/uL (ref 1.4–7.0)
Neutrophils: 69 %
Platelets: 192 10*3/uL (ref 150–450)
RBC: 4.31 x10E6/uL (ref 3.77–5.28)
RDW: 15.9 % — ABNORMAL HIGH (ref 12.3–15.4)
RPR Ser Ql: NONREACTIVE
Rh Factor: POSITIVE
Rubella Antibodies, IGG: 4.1 index (ref >0.99)
WBC: 5.9 10*3/uL (ref 3.4–10.8)

## 2018-07-16 LAB — CYTOLOGY - PAP
Diagnosis: NEGATIVE
HPV 16/18/45 genotyping: NEGATIVE
HPV: DETECTED — AB

## 2018-07-16 LAB — HEMOGLOBINOPATHY EVALUATION
HGB C: 0 %
HGB S: 0 %
HGB VARIANT: 0 %
Hemoglobin A2 Quantitation: 2.2 % (ref 1.8–3.2)
Hemoglobin F Quantitation: 0 % (ref 0.0–2.0)
Hgb A: 97.8 % (ref 96.4–98.8)

## 2018-07-16 LAB — HEMOGLOBIN A1C
Est. average glucose Bld gHb Est-mCnc: 105 mg/dL
Hgb A1c MFr Bld: 5.3 % (ref 4.8–5.6)

## 2018-07-17 ENCOUNTER — Other Ambulatory Visit: Payer: Self-pay

## 2018-07-17 ENCOUNTER — Inpatient Hospital Stay (HOSPITAL_COMMUNITY)
Admission: AD | Admit: 2018-07-17 | Discharge: 2018-07-17 | Disposition: A | Discharge status: Home / Self Care | Payer: Medicare Other | Attending: Obstetrics and Gynecology | Admitting: Obstetrics and Gynecology

## 2018-07-17 ENCOUNTER — Encounter (HOSPITAL_COMMUNITY): Payer: Self-pay

## 2018-07-17 DIAGNOSIS — O26892 Other specified pregnancy related conditions, second trimester: Secondary | ICD-10-CM | POA: Insufficient documentation

## 2018-07-17 DIAGNOSIS — N949 Unspecified condition associated with female genital organs and menstrual cycle: Secondary | ICD-10-CM

## 2018-07-17 DIAGNOSIS — N939 Abnormal uterine and vaginal bleeding, unspecified: Secondary | ICD-10-CM | POA: Diagnosis present

## 2018-07-17 DIAGNOSIS — O26851 Spotting complicating pregnancy, first trimester: Secondary | ICD-10-CM

## 2018-07-17 DIAGNOSIS — O26859 Spotting complicating pregnancy, unspecified trimester: Secondary | ICD-10-CM

## 2018-07-17 DIAGNOSIS — Z79899 Other long term (current) drug therapy: Secondary | ICD-10-CM | POA: Insufficient documentation

## 2018-07-17 DIAGNOSIS — Z3A15 15 weeks gestation of pregnancy: Secondary | ICD-10-CM | POA: Diagnosis not present

## 2018-07-17 LAB — URINALYSIS, ROUTINE W REFLEX MICROSCOPIC
Bilirubin Urine: NEGATIVE
Glucose, UA: NEGATIVE mg/dL
Hgb urine dipstick: NEGATIVE
Ketones, ur: NEGATIVE mg/dL
Nitrite: NEGATIVE
Protein, ur: NEGATIVE mg/dL
Specific Gravity, Urine: 1.018 (ref 1.005–1.030)
pH: 7 (ref 5.0–8.0)

## 2018-07-17 NOTE — Discharge Instructions (Signed)
Round Ligament Pain during Pregnancy Many women will experience a type of pain referred to as "round ligament pain" during their pregnancy. This is associated with abdominal pain or discomfort. Since any type of abdominal pain during pregnancy can be disconcerting, it is important to talk about round ligament pain to relieve any anxiety or fears you may have regarding the symptoms you are feeling. Round ligament pain is due to normal changes that take place in the body during pregnancy. It is caused by stretching of the round ligaments attached to the uterus. More commonly it occurs on the right side of the pelvis. Round Ligament: An Overview Typically in the non-pregnant state the uterus is about the size of an apple or pear. There are thick ligaments which hold the uterus in place in the abdomen, referred to as round ligaments. During pregnancy, your uterus will expand in size and weight, and the ligaments supporting it will have to stretch, becoming longer and thinner. As these ligaments pull and tug they may irritate nearby nerve fibers, which causes pain. The severity of the pain in some cases can seem extreme. Some common symptoms of round ligament pain include:  Ligament spasms or contractions/cramps that trigger a sharp pain typically on the right side of the abdomen.  Pain upon waking or suddenly rolling over in your sleep.  Pain in the abdomen that is sharp brought on by exercise or other vigorous activity. Similar Problems Round ligament pain is often mistaken for other medical conditions because the symptoms are similar. Acute abdominal pain during pregnancy may also be a sign of other conditions including:  Abdominal cramps - Some abdominal pain is simply caused by change in bowel habits associated with pregnancy. Gas is a common problem that can cause sharp, shooting pain. You should always seek out medical care if your pain is accompanied by fever, chills, pain  upon urination or if you have difficulty walking. Further exams and tests will be conducted to ensure that you do not have a more serious condition. It is not uncommon for women with lower abdominal pain to have a urinary tract infection, thus you may also be asked for a urine sample. Treatment If all other conditions are ruled out you can treat your round ligament pain relatively easily. You may be advised to take some acetaminophen (Tylenol) to reduce the severity of any persistent pain and asked to reduce your activity level. You can apply a heating pad to the area of pain or take a warm bath. Lying on the opposite side of the pain may help as well. Most women will find relief from round ligament pain simply by altering their daily routines slightly. The good news is round ligament pain will disappear completely once you have given birth to your child!  

## 2018-07-17 NOTE — MAU Note (Signed)
[name] is a 32 y.o. at [redacted]w[redacted]d here in MAU reporting:  +vaginal bleeding Ongoing intermittent occurrence since beginning of pregnancy she reports Red and brown in color Not wear a pad States is here because she is concerned that the bleeding has continued and not stopped yet  Pain score: denies at this time Vitals:   07/17/18 1047  BP: 118/76  Pulse: 83  Resp: 18  Temp: 97.9 F (36.6 C)  SpO2: 99%     Lab orders placed from triage: ua

## 2018-07-17 NOTE — MAU Provider Note (Signed)
History     CSN: [id number]  Arrival date and time: 07/17/18 1037   First Provider Initiated Contact with Patient 07/17/18 1058      Chief Complaint  Patient presents with  . Vaginal Bleeding   [name] is a 32 y.o. [phone] at [redacted]w[redacted]d who presents today with spotting. She reports that she has had this off and on since finding out she was pregnant. However, she wanted to make sure the baby was ok today. She has had cramping off and on.   Vaginal Bleeding  The patient's primary symptoms include pelvic pain and vaginal bleeding. The patient's pertinent negatives include no vaginal discharge. This is a new problem. The current episode started more than 1 month ago. The problem occurs intermittently. The problem has been unchanged. Pain severity now: 3/10. The problem affects both sides. She is pregnant. Pertinent negatives include no chills, dysuria, fever, frequency, nausea or vomiting. The vaginal bleeding is spotting. She has not been passing clots. She has not been passing tissue. Nothing aggravates the symptoms. She has tried nothing for the symptoms. Her menstrual history has been irregular (LMP 04/02/18 ).    OB History    Gravida  4   Para  3   Term  2   Preterm  1   AB  0   Living  3     SAB  0   TAB      Ectopic      Multiple  0   Live Births  3           Past Medical History:  Diagnosis Date  . Anxiety   . Asthma    inhaler PRN-hasn't used inhaler in months  . Attention deficit hyperactivity disorder   . Bipolar disorder [facility])    hospitalized as a teen for suicidal ideations and cutting  . Depression   . Gastritis   . GERD (gastroesophageal reflux disease)   . HPV (human papilloma virus) anogenital infection   . Hx of varicella   . Mild preeclampsia 02/09/2016  . Vaginal Pap smear, abnormal     Past Surgical History:  Procedure Laterality Date  . EYE MUSCLE SURGERY Bilateral    07/29/12  . MOUTH SURGERY    . PLANTAR FASCIA SURGERY     . WISDOM TOOTH EXTRACTION      Family History  Problem Relation Age of Onset  . Depression Mother   . Colon cancer Mother   . Colon polyps Mother   . Depression Father   . Depression Brother   . Liver disease Unknown   . Kidney disease Unknown     Social History   Tobacco Use  . Smoking status: Never Smoker  . Smokeless tobacco: Never Used  Substance Use Topics  . Alcohol use: No    Alcohol/week: 0.0 oz  . Drug use: No    Allergies:  Allergies  Allergen Reactions  . Depakote [Divalproex Sodium] Other (See Comments)    Pt states that this medication makes her blood levels toxic.    [name] Methylphenidate Derivatives Other (See Comments)    Reaction:  Depression and anger   . Neurontin [Gabapentin] Other (See Comments)    Reaction:  Dizziness   . Prozac [Fluoxetine Hcl] Other (See Comments)    Reaction:  Anger     Medications Prior to Admission  Medication Sig Dispense Refill Last Dose  . ARIPiprazole (ABILIFY) 15 MG tablet Take 15 mg by mouth daily.   Taking  .  aspirin EC 81 MG tablet Take 1 tablet (81 mg total) by mouth daily. 100 tablet 2   . busPIRone (BUSPAR) 10 MG tablet Take 10 mg by mouth 2 (two) times daily.   Taking  . Doxylamine-Pyridoxine (DICLEGIS) 10-10 MG TBEC Take 2 tablets by mouth at bedtime. If symptoms persist, add one tablet in the morning and one in the afternoon (Patient not taking: Reported on 07/13/2018) 100 tablet 5 Not Taking  . Prenatal Multivit-Min-Fe-FA (PRENATAL VITAMINS) 0.8 MG tablet Take 1 tablet by mouth daily. (Patient not taking: Reported on 07/13/2018) 30 tablet 12 Not Taking  . Prenatal Vit-Fe Fumarate-FA (PRENATAL MULTIVITAMIN) TABS tablet Take 1 tablet by mouth at bedtime. 30 tablet 11     Review of Systems  Constitutional: Negative for chills and fever.  Gastrointestinal: Negative for nausea and vomiting.  Genitourinary: Positive for pelvic pain and vaginal bleeding. Negative for dysuria, frequency and vaginal discharge.    Physical Exam   Blood pressure 118/76, pulse 83, temperature 97.9 F (36.6 C), temperature source Oral, resp. rate 18, weight 185 lb 1.3 oz (84 kg), last menstrual period 04/02/2018, SpO2 99 %, unknown if currently breastfeeding.  Physical Exam  Nursing note and vitals reviewed. Constitutional: She is oriented to person, place, and time. She appears well-developed and well-nourished. No distress.  HENT:  Head: Normocephalic.  Cardiovascular: Normal rate.  Respiratory: Effort normal.  GI: Soft. There is no tenderness. There is no rebound.  Genitourinary:  Genitourinary Comments:  External: no lesion Vagina: small amount of white discharge, no blood seen  Cervix: pink, smooth, no CMT, closed/thick  Uterus: AGA  Neurological: She is alert and oriented to person, place, and time.  Skin: Skin is warm and dry.  Psychiatric: She has a normal mood and affect.   Pt informed that the ultrasound is considered a limited OB ultrasound and is not intended to be a complete ultrasound exam.  Patient also informed that the ultrasound is not being completed with the intent of assessing for fetal or placental anomalies or any pelvic abnormalities.  Explained that the purpose of today's ultrasound is to assess for  viability.  Patient acknowledges the purpose of the exam and the limitations of the study.    FHT: 160 with US doppler FL consistent with 15 week 1 day gestation  MAU Course  Procedures  MDM   Assessment and Plan   1. Spotting in pregnancy   2. Round ligament pain   3. [redacted] weeks gestation of pregnancy    DC home Reassurance provided to the patient and [name] Comfort measures reviewed  2nd Trimester precautions  Bleeding precautions RX: none  Return to MAU as needed FU with OB as planned  Follow-up [place] Follow up.   Specialty:  Obstetrics and Gynecology Contact information: [address] [place] 07/17/2018, 10:59 AM

## 2018-07-18 LAB — URINE CULTURE, OB REFLEX

## 2018-07-18 LAB — CULTURE, OB URINE

## 2018-07-19 ENCOUNTER — Encounter: Payer: Self-pay | Admitting: Obstetrics & Gynecology

## 2018-07-20 LAB — SMN1 COPY NUMBER ANALYSIS (SMA CARRIER SCREENING)

## 2018-08-11 ENCOUNTER — Encounter: Payer: Medicare Other | Admitting: Obstetrics

## 2018-08-11 ENCOUNTER — Other Ambulatory Visit: Payer: Self-pay | Admitting: Obstetrics & Gynecology

## 2018-08-11 ENCOUNTER — Ambulatory Visit (HOSPITAL_COMMUNITY)
Admission: RE | Admit: 2018-08-11 | Discharge: 2018-08-11 | Disposition: A | Discharge status: Home / Self Care | Payer: Medicare Other | Source: Ambulatory Visit | Attending: Obstetrics & Gynecology | Admitting: Obstetrics & Gynecology

## 2018-08-11 DIAGNOSIS — O99342 Other mental disorders complicating pregnancy, second trimester: Secondary | ICD-10-CM

## 2018-08-11 DIAGNOSIS — Z3A18 18 weeks gestation of pregnancy: Secondary | ICD-10-CM | POA: Diagnosis not present

## 2018-08-11 DIAGNOSIS — O09892 Supervision of other high risk pregnancies, second trimester: Secondary | ICD-10-CM

## 2018-08-11 DIAGNOSIS — O09292 Supervision of pregnancy with other poor reproductive or obstetric history, second trimester: Secondary | ICD-10-CM | POA: Diagnosis not present

## 2018-08-11 DIAGNOSIS — O09299 Supervision of pregnancy with other poor reproductive or obstetric history, unspecified trimester: Secondary | ICD-10-CM

## 2018-08-11 DIAGNOSIS — Z363 Encounter for antenatal screening for malformations: Secondary | ICD-10-CM

## 2018-08-11 DIAGNOSIS — Z3490 Encounter for supervision of normal pregnancy, unspecified, unspecified trimester: Secondary | ICD-10-CM

## 2018-08-12 ENCOUNTER — Ambulatory Visit (HOSPITAL_COMMUNITY): Payer: Medicare Other

## 2018-08-13 ENCOUNTER — Other Ambulatory Visit (HOSPITAL_COMMUNITY): Payer: Medicare Other

## 2018-08-16 ENCOUNTER — Other Ambulatory Visit: Payer: Self-pay

## 2018-08-16 ENCOUNTER — Encounter (HOSPITAL_COMMUNITY): Payer: Self-pay | Admitting: Emergency Medicine

## 2018-08-16 ENCOUNTER — Ambulatory Visit (HOSPITAL_COMMUNITY)
Admission: EM | Admit: 2018-08-16 | Discharge: 2018-08-16 | Disposition: A | Discharge status: Home / Self Care | Payer: Medicare Other | Attending: Family Medicine | Admitting: Family Medicine

## 2018-08-16 DIAGNOSIS — K0889 Other specified disorders of teeth and supporting structures: Secondary | ICD-10-CM | POA: Diagnosis not present

## 2018-08-16 MED ORDER — ACETAMINOPHEN 500 MG PO TABS: 500.0000 mg | ORAL_TABLET | Freq: Four times a day (QID) | ORAL | 0 refills | Status: DC | PRN

## 2018-08-16 MED ORDER — CEPHALEXIN 500 MG PO CAPS: 500.0000 mg | ORAL_CAPSULE | Freq: Four times a day (QID) | ORAL | 0 refills | Status: DC

## 2018-08-16 NOTE — ED Triage Notes (Addendum)
Bottom, right tooth is painful.  Patient says tooth is broken.  Pain for 1 1/2 weeks  Patient is pregnant
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
## 2018-08-16 NOTE — Discharge Instructions (Signed)
Please use dental resource to contact offices to seek permenant treatment/relief.   Today we have given you an antibiotic. This should help with pain as any infection is cleared.   Please use [phone] mg Tylenol every 4-6 hours for pain, avoid ibuprofen  Please return if you start to experience significant swelling of your face, experiencing fever.

## 2018-08-18 ENCOUNTER — Encounter: Payer: Medicare Other | Admitting: Obstetrics

## 2018-08-19 ENCOUNTER — Encounter: Payer: Medicare Other | Admitting: Obstetrics

## 2018-08-23 ENCOUNTER — Encounter: Payer: Medicare Other | Admitting: Obstetrics and Gynecology

## 2018-09-01 ENCOUNTER — Encounter: Payer: Medicare Other | Admitting: Obstetrics & Gynecology

## 2018-09-01 NOTE — Progress Notes (Deleted)
   Patient did not show up today for her scheduled appointment.   Najae Filsaime, MD, FACOG Obstetrician & Gynecologist, [facility], [facility]  

## 2018-09-14 ENCOUNTER — Ambulatory Visit (INDEPENDENT_AMBULATORY_CARE_PROVIDER_SITE_OTHER): Payer: Medicare Other | Admitting: Obstetrics & Gynecology

## 2018-09-14 ENCOUNTER — Encounter: Payer: Self-pay | Admitting: Obstetrics & Gynecology

## 2018-09-14 VITALS — BP 107/72 | HR 67 | Wt 182.8 lb

## 2018-09-14 DIAGNOSIS — G4733 Obstructive sleep apnea (adult) (pediatric): Secondary | ICD-10-CM

## 2018-09-14 DIAGNOSIS — O09899 Supervision of other high risk pregnancies, unspecified trimester: Secondary | ICD-10-CM

## 2018-09-14 DIAGNOSIS — F411 Generalized anxiety disorder: Secondary | ICD-10-CM

## 2018-09-14 DIAGNOSIS — K219 Gastro-esophageal reflux disease without esophagitis: Secondary | ICD-10-CM

## 2018-09-14 DIAGNOSIS — J4520 Mild intermittent asthma, uncomplicated: Secondary | ICD-10-CM

## 2018-09-14 DIAGNOSIS — O09299 Supervision of pregnancy with other poor reproductive or obstetric history, unspecified trimester: Secondary | ICD-10-CM

## 2018-09-14 DIAGNOSIS — F79 Unspecified intellectual disabilities: Secondary | ICD-10-CM

## 2018-09-14 DIAGNOSIS — Z8632 Personal history of gestational diabetes: Secondary | ICD-10-CM

## 2018-09-14 DIAGNOSIS — O09219 Supervision of pregnancy with history of pre-term labor, unspecified trimester: Secondary | ICD-10-CM

## 2018-09-14 DIAGNOSIS — F3132 Bipolar disorder, current episode depressed, moderate: Secondary | ICD-10-CM

## 2018-09-14 DIAGNOSIS — F900 Attention-deficit hyperactivity disorder, predominantly inattentive type: Secondary | ICD-10-CM

## 2018-09-14 DIAGNOSIS — E669 Obesity, unspecified: Secondary | ICD-10-CM

## 2018-09-14 DIAGNOSIS — O0992 Supervision of high risk pregnancy, unspecified, second trimester: Secondary | ICD-10-CM

## 2018-09-14 MED ORDER — BUSPIRONE HCL 10 MG PO TABS: 10.0000 mg | ORAL_TABLET | Freq: Two times a day (BID) | ORAL | 2 refills | Status: DC

## 2018-09-14 MED ORDER — ARIPIPRAZOLE 15 MG PO TABS: 15.0000 mg | ORAL_TABLET | Freq: Every day | ORAL | 2 refills | Status: DC

## 2018-09-14 NOTE — Progress Notes (Signed)
Patient states that she has not felt any fetal movement, denies any pain or bleeding.
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
## 2018-09-30 ENCOUNTER — Telehealth: Payer: Self-pay

## 2018-09-30 NOTE — Telephone Encounter (Signed)
TC from pt regarding medication management for Abilify. Per notes Rx was refilled on 09/14/18 Pt asked if she can also continue Buspar  I see both Abilify and Buspar were both sent on 09/14/18 however just noted about Abilify . I did contact pharmacy so that abilify will be ready for pick up cost is $3.40 pt was made aware.  Pt states it was not discussed at last visit if she could take Buspar. Pt advised to wait for call back regarding Rx management.  Please advise.

## 2018-10-01 NOTE — Telephone Encounter (Signed)
TC to pt to make [facility] pt not ava left detailed message for pt to call back.

## 2018-10-13 ENCOUNTER — Other Ambulatory Visit: Payer: Medicare Other

## 2018-10-13 ENCOUNTER — Encounter: Payer: Medicare Other | Admitting: Family Medicine

## 2018-10-13 ENCOUNTER — Encounter: Payer: Self-pay | Admitting: Family Medicine

## 2018-10-13 NOTE — Progress Notes (Signed)
Patient did not keep appointment today. She will be called to reschedule.  

## 2018-10-15 ENCOUNTER — Encounter (HOSPITAL_COMMUNITY): Payer: Self-pay | Admitting: *Deleted

## 2018-10-15 ENCOUNTER — Inpatient Hospital Stay (HOSPITAL_COMMUNITY)
Admission: AD | Admit: 2018-10-15 | Discharge: 2018-10-15 | Disposition: A | Discharge status: Home / Self Care | Payer: Medicare Other | Source: Ambulatory Visit | Attending: Obstetrics & Gynecology | Admitting: Obstetrics & Gynecology

## 2018-10-15 DIAGNOSIS — O368130 Decreased fetal movements, third trimester, not applicable or unspecified: Secondary | ICD-10-CM | POA: Insufficient documentation

## 2018-10-15 DIAGNOSIS — O26893 Other specified pregnancy related conditions, third trimester: Secondary | ICD-10-CM | POA: Insufficient documentation

## 2018-10-15 DIAGNOSIS — O26853 Spotting complicating pregnancy, third trimester: Secondary | ICD-10-CM | POA: Insufficient documentation

## 2018-10-15 DIAGNOSIS — K029 Dental caries, unspecified: Secondary | ICD-10-CM | POA: Diagnosis not present

## 2018-10-15 DIAGNOSIS — Z3A28 28 weeks gestation of pregnancy: Secondary | ICD-10-CM | POA: Diagnosis not present

## 2018-10-15 DIAGNOSIS — Z3689 Encounter for other specified antenatal screening: Secondary | ICD-10-CM

## 2018-10-15 DIAGNOSIS — R8290 Unspecified abnormal findings in urine: Secondary | ICD-10-CM | POA: Diagnosis not present

## 2018-10-15 LAB — URINALYSIS, ROUTINE W REFLEX MICROSCOPIC
Glucose, UA: NEGATIVE mg/dL
Hgb urine dipstick: NEGATIVE
Ketones, ur: 5 mg/dL — AB
Nitrite: NEGATIVE
Protein, ur: 100 mg/dL — AB
Specific Gravity, Urine: 1.031 — ABNORMAL HIGH (ref 1.005–1.030)
pH: 5 (ref 5.0–8.0)

## 2018-10-15 MED ORDER — TRAMADOL HCL 50 MG PO TABS
100.0000 mg | ORAL_TABLET | Freq: Once | ORAL | Status: AC
  Administered 2018-10-15: 100 mg via ORAL
  Filled 2018-10-15: qty 2

## 2018-10-15 MED ORDER — TRAMADOL HCL 50 MG PO TABS: 50.0000 mg | ORAL_TABLET | Freq: Four times a day (QID) | ORAL | 0 refills | Status: DC | PRN

## 2018-10-15 NOTE — MAU Note (Signed)
Have not felt baby move much. Pink spotting since yesterday. Occ ctxs - think is [employer]. Tooth on bottom R side is hurting. Present for several months but worse. Scared of dentists.

## 2018-10-15 NOTE — MAU Provider Note (Signed)
Chief Complaint:  Contractions; Dental Pain; and Decreased Fetal Movement   First Provider Initiated Contact with Patient 10/15/18 2107      HPI: [name] is a 32 y.o. [phone] at [redacted]w[redacted]d who presents to maternity admissions reporting tooth pain in her right molar, decreased fetal movement today, spotting yesterday, and irregular contractions. She reports the tooth pain has been present for 3-4 months but it has gotten worse recently. She reports she has felt fetal movement today but less than usual. She is feeling movement in MAU.  She had spotting yesterday when wiping. Last intercourse 2 days ago.  She reports cramping throughout the day, with pain low in her abdomen and in her low back. It is mild and irregular, and not increasing in intensity.  It occurs 4-5 times per day.  There are no other symptoms. She has not tried any treatments.   HPI  Past Medical History: Past Medical History:  Diagnosis Date  . Anxiety   . Asthma    inhaler PRN-hasn't used inhaler in months  . Attention deficit hyperactivity disorder   . Bipolar disorder [facility])    hospitalized as a teen for suicidal ideations and cutting  . Depression   . Gastritis   . GERD (gastroesophageal reflux disease)   . HPV (human papilloma virus) anogenital infection   . Hx of varicella   . Mild preeclampsia 02/09/2016  . Vaginal Pap smear, abnormal     Past obstetric history: OB History  Gravida Para Term Preterm AB Living  4 3 2 1  0 3  SAB TAB Ectopic Multiple Live Births  0     0 3    # Outcome Date GA Lbr Len/2nd Weight Sex Delivery Anes PTL Lv  4 Current           3 Term 02/19/18 [redacted]w[redacted]d 946:02 / 01:46 3790 g M Vag-Spont EPI  LIV  2 Preterm 02/03/17 [redacted]w[redacted]d  3400 g M Vag-Spont EPI  LIV     Birth Comments: pre eclampsia  1 Term 02/11/16 [redacted]w[redacted]d / 01:14 3702 g F Vag-Spont EPI  LIV    Past Surgical History: Past Surgical History:  Procedure Laterality Date  . EYE MUSCLE SURGERY Bilateral    07/29/12  . MOUTH  SURGERY    . PLANTAR FASCIA SURGERY    . WISDOM TOOTH EXTRACTION      Family History: Family History  Problem Relation Age of Onset  . Depression Mother   . Colon cancer Mother   . Colon polyps Mother   . Depression Father   . Depression Brother   . Liver disease Unknown   . Kidney disease Unknown     Social History: Social History   Tobacco Use  . Smoking status: Never Smoker  . Smokeless tobacco: Never Used  Substance Use Topics  . Alcohol use: No    Alcohol/week: 0.0 standard drinks  . Drug use: No    Allergies:  Allergies  Allergen Reactions  . Depakote [Divalproex Sodium] Other (See Comments)    Pt states that this medication makes her blood levels toxic.    [name] Methylphenidate Derivatives Other (See Comments)    Reaction:  Depression and anger   . Neurontin [Gabapentin] Other (See Comments)    Reaction:  Dizziness   . Prozac [Fluoxetine Hcl] Other (See Comments)    Reaction:  Anger     Meds:  No medications prior to admission.    ROS:  Review of Systems  Constitutional: Negative for chills  and fever.  HENT: Positive for dental problem.   Respiratory: Negative for cough and shortness of breath.   Cardiovascular: Negative for chest pain.  Gastrointestinal: Positive for abdominal pain. Negative for nausea and vomiting.  Genitourinary: Negative for dysuria, frequency and urgency.  Musculoskeletal: Positive for back pain.  Neurological: Negative for dizziness and headaches.     I have reviewed patient's Past Medical Hx, Surgical Hx, Family Hx, Social Hx, medications and allergies.   Physical Exam   Patient Vitals for the past 24 hrs:  BP Temp Temp src Pulse Resp Height Weight  10/15/18 2204 121/85 98.6 F (37 C) Oral 79 17 -- --  10/15/18 2026 123/85 98.6 F (37 C) -- 91 18 5\' 5"  (1.651 m) 84.4 kg   Physical Exam  HENT:  Mouth/Throat:     Constitutional: Well-developed, well-nourished female in no acute distress.  Cardiovascular: normal  rate Respiratory: normal effort GI: Abd soft, non-tender, gravid appropriate for gestational age.  MS: Extremities nontender, no edema, normal ROM Neurologic: Alert and oriented x 4.  GU: Neg CVAT.   Dilation: Closed Effacement (%): Thick Exam by:: Lattie Haw L., cnm  FHT:  Baseline 120, moderate variability, accelerations present, no decelerations Contractions: None on toco or to palpation   Labs: Results for orders placed or performed during the hospital encounter of 10/15/18 (from the past 24 hour(s))  Urinalysis, Routine w reflex microscopic     Status: Abnormal   Collection Time: 10/15/18  8:44 PM  Result Value Ref Range   Color, Urine AMBER (A) YELLOW   APPearance CLOUDY (A) CLEAR   Specific Gravity, Urine 1.031 (H) 1.005 - 1.030   pH 5.0 5.0 - 8.0   Glucose, UA NEGATIVE NEGATIVE mg/dL   Hgb urine dipstick NEGATIVE NEGATIVE   Bilirubin Urine SMALL (A) NEGATIVE   Ketones, ur 5 (A) NEGATIVE mg/dL   Protein, ur 100 (A) NEGATIVE mg/dL   Nitrite NEGATIVE NEGATIVE   Leukocytes, UA MODERATE (A) NEGATIVE   RBC / HPF 0-5 0 - 5 RBC/hpf   WBC, UA 21-50 0 - 5 WBC/hpf   Bacteria, UA RARE (A) NONE SEEN   Squamous Epithelial / LPF 21-50 0 - 5   Mucus PRESENT    Non Squamous Epithelial 0-5 (A) NONE SEEN   O/Positive/-- (07/30 1459)  Imaging:  No results found.  MAU Course/MDM: UA not a clean catch with leukocytes so sent for culture Cervix closed, so no evidence of preterm labor No bleeding noted on pelvic exam NST reviewed and reactive with 15 x 15 accels Left lower molar with obvious caries but no evidence of infection of surrounding tissue or of tooth. No signs of systemic infection. Pain management with Tramadol, first dose given in MAU Dental letter in pregnancy provided Pt to f/u with dentist Keep appts in [facility] office Return to MAU as needed for emergencies Pt discharge with strict return precautions.   Assessment: 1. Pain due to dental caries   2. NST (non-stress  test) reactive   3. Decreased fetal movement affecting management of pregnancy in third trimester, single or unspecified fetus   4. Spotting affecting pregnancy in third trimester     Plan: Discharge home Labor precautions and fetal kick counts Follow-up [place] for [place] Follow up.   Specialty:  Obstetrics and Gynecology Why:  As scheduled on Oct 22, 2018. Return to MAU as needed for emergencies. Contact information: [place] [phone]  Allergies as of 10/15/2018      Reactions   Depakote [divalproex Sodium] Other (See Comments)   Pt states that this medication makes her blood levels toxic.     Methylphenidate Derivatives Other (See Comments)   Reaction:  Depression and anger    Neurontin [gabapentin] Other (See Comments)   Reaction:  Dizziness    Prozac [fluoxetine Hcl] Other (See Comments)   Reaction:  Anger       Medication List    STOP taking these medications   acetaminophen 500 MG tablet Commonly known as:  TYLENOL   cephALEXin 500 MG capsule Commonly known as:  KEFLEX     TAKE these medications   ARIPiprazole 15 MG tablet Commonly known as:  ABILIFY Take 1 tablet (15 mg total) by mouth daily.   busPIRone 10 MG tablet Commonly known as:  BUSPAR Take 1 tablet (10 mg total) by mouth 2 (two) times daily.   prenatal multivitamin Tabs tablet Take 1 tablet by mouth at bedtime.   traMADol 50 MG tablet Commonly known as:  ULTRAM Take 1 tablet (50 mg total) by mouth every 6 (six) hours as needed.       Fatima Blank Certified Nurse-Midwife 10/16/2018 3:43 AM

## 2018-10-17 LAB — CULTURE, OB URINE: Culture: >=100000 — AB

## 2018-10-22 ENCOUNTER — Other Ambulatory Visit: Payer: Medicare Other

## 2018-11-03 ENCOUNTER — Telehealth: Payer: Self-pay | Admitting: *Deleted

## 2018-11-03 NOTE — Telephone Encounter (Signed)
Pt called to office stating she has shoulder pain, ? Preeclampsia related?  Return call to pt. LM on VM to call office.

## 2018-11-09 ENCOUNTER — Encounter: Payer: Medicare Other | Admitting: Family Medicine

## 2018-11-12 ENCOUNTER — Inpatient Hospital Stay (HOSPITAL_COMMUNITY)
Admission: AD | Admit: 2018-11-12 | Discharge: 2018-11-12 | Disposition: A | Discharge status: Home / Self Care | Payer: Medicare Other | Attending: Obstetrics & Gynecology | Admitting: Obstetrics & Gynecology

## 2018-11-12 ENCOUNTER — Encounter (HOSPITAL_COMMUNITY): Payer: Self-pay | Admitting: *Deleted

## 2018-11-12 DIAGNOSIS — N949 Unspecified condition associated with female genital organs and menstrual cycle: Secondary | ICD-10-CM | POA: Diagnosis not present

## 2018-11-12 DIAGNOSIS — R102 Pelvic and perineal pain: Secondary | ICD-10-CM | POA: Insufficient documentation

## 2018-11-12 DIAGNOSIS — R51 Headache: Secondary | ICD-10-CM | POA: Insufficient documentation

## 2018-11-12 DIAGNOSIS — Z3A32 32 weeks gestation of pregnancy: Secondary | ICD-10-CM | POA: Diagnosis not present

## 2018-11-12 DIAGNOSIS — O26893 Other specified pregnancy related conditions, third trimester: Secondary | ICD-10-CM | POA: Diagnosis present

## 2018-11-12 LAB — URINALYSIS, ROUTINE W REFLEX MICROSCOPIC
Bilirubin Urine: NEGATIVE
Glucose, UA: NEGATIVE mg/dL
Hgb urine dipstick: NEGATIVE
Ketones, ur: NEGATIVE mg/dL
Nitrite: NEGATIVE
Protein, ur: 30 mg/dL — AB
Specific Gravity, Urine: 1.02 (ref 1.005–1.030)
pH: 5 (ref 5.0–8.0)

## 2018-11-12 LAB — WET PREP, GENITAL
Clue Cells Wet Prep HPF POC: NONE SEEN
Sperm: NONE SEEN
Trich, Wet Prep: NONE SEEN
Yeast Wet Prep HPF POC: NONE SEEN

## 2018-11-12 MED ORDER — BUTALBITAL-APAP-CAFFEINE 50-325-40 MG PO TABS: 1.0000 | ORAL_TABLET | Freq: Four times a day (QID) | ORAL | 0 refills | Status: DC | PRN

## 2018-11-12 NOTE — Discharge Instructions (Signed)
Abdominal Pain During Pregnancy Belly (abdominal) pain is common during pregnancy. Most of the time, it is not a serious problem. Other times, it can be a sign that something is wrong with the pregnancy. Always tell your doctor if you have belly pain. Follow these instructions at home: Monitor your belly pain for any changes. The following actions may help you feel better:  Do not have sex (intercourse) or put anything in your vagina until you feel better.  Rest until your pain stops.  Drink clear fluids if you feel sick to your stomach (nauseous). Do not eat solid food until you feel better.  Only take medicine as told by your doctor.  Keep all doctor visits as told.  Get help right away if:  You are bleeding, leaking fluid, or pieces of tissue come out of your vagina.  You have more pain or cramping.  You keep throwing up (vomiting).  You have pain when you pee (urinate) or have blood in your pee.  You have a fever.  You do not feel your baby moving as much.  You feel very weak or feel like passing out.  You have trouble breathing, with or without belly pain.  You have a very bad headache and belly pain.  You have fluid leaking from your vagina and belly pain.  You keep having watery poop (diarrhea).  Your belly pain does not go away after resting, or the pain gets worse. This information is not intended to replace advice given to you by your health care provider. Make sure you discuss any questions you have with your health care provider. Document Released: 11/19/2009 Document Revised: 07/09/2016 Document Reviewed: 06/30/2013 Elsevier Interactive Patient Education  2018 [employer]vier Inc.  

## 2018-11-12 NOTE — MAU Provider Note (Signed)
None     Chief Complaint:  Headache and Pelvic Pain   [name] is  32 y.o. [phone] at [redacted]w[redacted]d presents complaining of Headache and Pelvic Pain .Has pelvic pressure, spotted yesterday X1, red, none today.  Also c/o HA on top right side of head  Took tylenol last night, it didn't help so she didn't take it again. Also has seen some spots.  Has missed many OB appts.   Obstetrical/Gynecological History: OB History    Gravida  4   Para  3   Term  2   Preterm  1   AB  0   Living  3     SAB  0   TAB      Ectopic      Multiple  0   Live Births  3          Past Medical History: Past Medical History:  Diagnosis Date  . Anxiety   . Asthma    inhaler PRN-hasn't used inhaler in months  . Attention deficit hyperactivity disorder   . Bipolar disorder [facility])    hospitalized as a teen for suicidal ideations and cutting  . Depression   . Gastritis   . GERD (gastroesophageal reflux disease)   . HPV (human papilloma virus) anogenital infection   . Hx of varicella   . Mild preeclampsia 02/09/2016  . Vaginal Pap smear, abnormal     Past Surgical History: Past Surgical History:  Procedure Laterality Date  . EYE MUSCLE SURGERY Bilateral    07/29/12  . MOUTH SURGERY    . PLANTAR FASCIA SURGERY    . WISDOM TOOTH EXTRACTION      Family History: Family History  Problem Relation Age of Onset  . Depression Mother   . Colon cancer Mother   . Colon polyps Mother   . Depression Father   . Depression Brother   . Liver disease Unknown   . Kidney disease Unknown     Social History: Social History   Tobacco Use  . Smoking status: Never Smoker  . Smokeless tobacco: Never Used  Substance Use Topics  . Alcohol use: No    Alcohol/week: 0.0 standard drinks  . Drug use: No    Allergies:  Allergies  Allergen Reactions  . Depakote [Divalproex Sodium] Other (See Comments)    Pt states that this medication makes her blood levels toxic.    [name] Methylphenidate  Derivatives Other (See Comments)    Reaction:  Depression and anger   . Neurontin [Gabapentin] Other (See Comments)    Reaction:  Dizziness   . Prozac [Fluoxetine Hcl] Other (See Comments)    Reaction:  Anger     Meds:  Medications Prior to Admission  Medication Sig Dispense Refill Last Dose  . ARIPiprazole (ABILIFY) 15 MG tablet Take 1 tablet (15 mg total) by mouth daily. 30 tablet 2   . busPIRone (BUSPAR) 10 MG tablet Take 1 tablet (10 mg total) by mouth 2 (two) times daily. 60 tablet 2   . Prenatal Vit-Fe Fumarate-FA (PRENATAL MULTIVITAMIN) TABS tablet Take 1 tablet by mouth at bedtime. 30 tablet 11 Taking  . traMADol (ULTRAM) 50 MG tablet Take 1 tablet (50 mg total) by mouth every 6 (six) hours as needed. 10 tablet 0     Review of Systems   Constitutional: Negative for fever and chills Eyes: Negative for visual disturbances Respiratory: Negative for shortness of breath, dyspnea Cardiovascular: Negative for chest pain or palpitations  Gastrointestinal: Negative for  vomiting, diarrhea and constipation Genitourinary: Negative for dysuria and urgency Musculoskeletal: Negative for back pain, joint pain, myalgias.  Normal ROM  Neurological: Negative for dizziness     Physical Exam  Blood pressure 120/80, pulse 84, temperature 98 F (36.7 C), resp. rate 18, height 5\' 4"  (1.626 m), weight 85.3 kg, last menstrual period 04/02/2018, unknown if currently breastfeeding. GENERAL: Well-developed, well-nourished female in no acute distress.  LUNGS: Normal respiratory effort HEART: Regular rate and rhythm. ABDOMEN: Soft, nontender, nondistended, gravid. FH 31 cms PELVIC:  SSE:  No blood in vagina. cx sl friable. Discharge white and creamy. Wet prep collected. Cx 1/long/vtx ballottable EXTREMITIES: Nontender, no edema, 2+ distal pulses. DTR's 2+   Labs: Results for orders placed or performed during the hospital encounter of 11/12/18 (from the past 24 hour(s))  Urinalysis, Routine w  reflex microscopic   Collection Time: 11/12/18  3:14 PM  Result Value Ref Range   Color, Urine YELLOW YELLOW   APPearance HAZY (A) CLEAR   Specific Gravity, Urine 1.020 1.005 - 1.030   pH 5.0 5.0 - 8.0   Glucose, UA NEGATIVE NEGATIVE mg/dL   Hgb urine dipstick NEGATIVE NEGATIVE   Bilirubin Urine NEGATIVE NEGATIVE   Ketones, ur NEGATIVE NEGATIVE mg/dL   Protein, ur 30 (A) NEGATIVE mg/dL   Nitrite NEGATIVE NEGATIVE   Leukocytes, UA TRACE (A) NEGATIVE   RBC / HPF 0-5 0 - 5 RBC/hpf   WBC, UA 0-5 0 - 5 WBC/hpf   Bacteria, UA RARE (A) NONE SEEN   Squamous Epithelial / LPF 6-10 0 - 5   Mucus PRESENT    Imaging Studies:  No results found.  Assessment: [name] is  32 y.o. [phone] at [redacted]w[redacted]d presents with pelvic pressure, HA, spotting yesterday, and saw spots.  No suspicion for PTL or preeclampsia.  Plan: DC home w/ fioricet rx  [name] 11/29/20194:10 PM

## 2018-11-12 NOTE — MAU Note (Signed)
Pt c/o she had some spotting last night. C/o HA ,blurred vision, and increase pelvic pressure and cramping. Decreased fetal movement also.

## 2018-11-13 ENCOUNTER — Encounter (HOSPITAL_COMMUNITY): Payer: Self-pay | Admitting: *Deleted

## 2018-11-13 ENCOUNTER — Inpatient Hospital Stay (HOSPITAL_COMMUNITY)
Admission: AD | Admit: 2018-11-13 | Discharge: 2018-11-13 | Disposition: A | Discharge status: Home / Self Care | Payer: Medicare Other | Source: Ambulatory Visit | Attending: Obstetrics and Gynecology | Admitting: Obstetrics and Gynecology

## 2018-11-13 ENCOUNTER — Other Ambulatory Visit: Payer: Self-pay

## 2018-11-13 DIAGNOSIS — R51 Headache: Secondary | ICD-10-CM

## 2018-11-13 DIAGNOSIS — Z3A32 32 weeks gestation of pregnancy: Secondary | ICD-10-CM | POA: Diagnosis not present

## 2018-11-13 DIAGNOSIS — R519 Headache, unspecified: Secondary | ICD-10-CM

## 2018-11-13 DIAGNOSIS — Z888 Allergy status to other drugs, medicaments and biological substances status: Secondary | ICD-10-CM | POA: Diagnosis not present

## 2018-11-13 DIAGNOSIS — O09293 Supervision of pregnancy with other poor reproductive or obstetric history, third trimester: Secondary | ICD-10-CM

## 2018-11-13 DIAGNOSIS — I10 Essential (primary) hypertension: Secondary | ICD-10-CM | POA: Diagnosis not present

## 2018-11-13 DIAGNOSIS — O26893 Other specified pregnancy related conditions, third trimester: Secondary | ICD-10-CM

## 2018-11-13 DIAGNOSIS — Z79899 Other long term (current) drug therapy: Secondary | ICD-10-CM | POA: Diagnosis not present

## 2018-11-13 DIAGNOSIS — O133 Gestational [pregnancy-induced] hypertension without significant proteinuria, third trimester: Secondary | ICD-10-CM | POA: Diagnosis not present

## 2018-11-13 DIAGNOSIS — G4489 Other headache syndrome: Secondary | ICD-10-CM | POA: Diagnosis not present

## 2018-11-13 LAB — CBC
HCT: 36.6 % (ref 36.0–46.0)
Hemoglobin: 11.7 g/dL — ABNORMAL LOW (ref 12.0–15.0)
MCH: 29.8 pg (ref 26.0–34.0)
MCHC: 32 g/dL (ref 30.0–36.0)
MCV: 93.4 fL (ref 80.0–100.0)
Platelets: 186 10*3/uL (ref 150–400)
RBC: 3.92 MIL/uL (ref 3.87–5.11)
RDW: 14 % (ref 11.5–15.5)
WBC: 7.3 10*3/uL (ref 4.0–10.5)
nRBC: 0 % (ref 0.0–0.2)

## 2018-11-13 LAB — PROTEIN / CREATININE RATIO, URINE
Creatinine, Urine: 303 mg/dL
Protein Creatinine Ratio: 0.12 mg/mg{Cre} (ref 0.00–0.15)
Total Protein, Urine: 36 mg/dL

## 2018-11-13 LAB — URINALYSIS, ROUTINE W REFLEX MICROSCOPIC
Glucose, UA: NEGATIVE mg/dL
Hgb urine dipstick: NEGATIVE
Ketones, ur: NEGATIVE mg/dL
Nitrite: NEGATIVE
Protein, ur: 30 mg/dL — AB
Specific Gravity, Urine: 1.039 — ABNORMAL HIGH (ref 1.005–1.030)
WBC, UA: >50 WBC/hpf — ABNORMAL HIGH (ref 0–5)
pH: 5 (ref 5.0–8.0)

## 2018-11-13 LAB — COMPREHENSIVE METABOLIC PANEL
ALT: 13 U/L (ref 0–44)
AST: 13 U/L — ABNORMAL LOW (ref 15–41)
Albumin: 3.1 g/dL — ABNORMAL LOW (ref 3.5–5.0)
Alkaline Phosphatase: 77 U/L (ref 38–126)
Anion gap: 8 (ref 5–15)
BUN: 13 mg/dL (ref 6–20)
CO2: 24 mmol/L (ref 22–32)
Calcium: 8.8 mg/dL — ABNORMAL LOW (ref 8.9–10.3)
Chloride: 104 mmol/L (ref 98–111)
Creatinine, Ser: 0.67 mg/dL (ref 0.44–1.00)
GFR calc Af Amer: >60 mL/min (ref >60)
GFR calc non Af Amer: >60 mL/min (ref >60)
Glucose, Bld: 110 mg/dL — ABNORMAL HIGH (ref 70–99)
Potassium: 3.8 mmol/L (ref 3.5–5.1)
Sodium: 136 mmol/L (ref 135–145)
Total Bilirubin: 0.6 mg/dL (ref 0.3–1.2)
Total Protein: 6.3 g/dL — ABNORMAL LOW (ref 6.5–8.1)

## 2018-11-13 MED ORDER — ACETAMINOPHEN 500 MG PO TABS
1000.0000 mg | ORAL_TABLET | Freq: Once | ORAL | Status: AC
  Administered 2018-11-13: 1000 mg via ORAL
  Filled 2018-11-13: qty 2

## 2018-11-13 MED ORDER — CYCLOBENZAPRINE HCL 10 MG PO TABS
10.0000 mg | ORAL_TABLET | Freq: Once | ORAL | Status: AC
  Administered 2018-11-13: 10 mg via ORAL
  Filled 2018-11-13: qty 1

## 2018-11-13 NOTE — MAU Provider Note (Signed)
Chief Complaint:  Headache; Blurred Vision; and Hypertension   First Provider Initiated Contact with Patient 11/13/18 1735     HPI: [name] is a 32 y.o. [id number] at [redacted]w[redacted]d who presents to maternity admissions via EMS reporting hypertension and headache. Hx of preeclampsia in previous pregnancies. Denies htn outside of pregnancy. Used her husband's BP cuff to check her BP today. Took her BP d/t headache. Both times it was 230s/120s.   Denies contractions, leakage of fluid or vaginal bleeding. Good fetal movement.  Location: generalized headache Quality: aching Severity: 5/10 in pain scale Duration: 1 day Timing: constant Modifying factors: nothing makes better or worse. Took 2 RS tylenol earlier today without relief Associated signs and symptoms: blurred vision & HTN  Past Medical History:  Diagnosis Date  . Anxiety   . Asthma    inhaler PRN-hasn't used inhaler in months  . Attention deficit hyperactivity disorder   . Bipolar disorder [facility])    hospitalized as a teen for suicidal ideations and cutting  . Depression   . Gastritis   . GERD (gastroesophageal reflux disease)   . HPV (human papilloma virus) anogenital infection   . Hx of varicella   . Mild preeclampsia 02/09/2016  . Vaginal Pap smear, abnormal    OB History  Gravida Para Term Preterm AB Living  4 3 2 1  0 3  SAB TAB Ectopic Multiple Live Births  0     0 3    # Outcome Date GA Lbr Len/2nd Weight Sex Delivery Anes PTL Lv  4 Current           3 Term 02/19/18 [redacted]w[redacted]d 946:02 / 01:46 3790 g M Vag-Spont EPI  LIV  2 Preterm 02/03/17 [redacted]w[redacted]d  3400 g M Vag-Spont EPI  LIV     Birth Comments: pre eclampsia  1 Term 02/11/16 [redacted]w[redacted]d / 01:14 3702 g F Vag-Spont EPI  LIV   Past Surgical History:  Procedure Laterality Date  . EYE MUSCLE SURGERY Bilateral    07/29/12  . MOUTH SURGERY    . PLANTAR FASCIA SURGERY    . WISDOM TOOTH EXTRACTION     Family History  Problem Relation Age of Onset  . Depression Mother   . Colon  cancer Mother   . Colon polyps Mother   . Depression Father   . Depression Brother   . Liver disease Unknown   . Kidney disease Unknown    Social History   Tobacco Use  . Smoking status: Never Smoker  . Smokeless tobacco: Never Used  Substance Use Topics  . Alcohol use: No    Alcohol/week: 0.0 standard drinks  . Drug use: No   Allergies  Allergen Reactions  . Depakote [Divalproex Sodium] Other (See Comments)    Pt states that this medication makes her blood levels toxic.    [name] Methylphenidate Derivatives Other (See Comments)    Reaction:  Depression and anger   . Neurontin [Gabapentin] Other (See Comments)    Reaction:  Dizziness   . Prozac [Fluoxetine Hcl] Other (See Comments)    Reaction:  Anger    Medications Prior to Admission  Medication Sig Dispense Refill Last Dose  . ARIPiprazole (ABILIFY) 15 MG tablet Take 1 tablet (15 mg total) by mouth daily. 30 tablet 2 11/12/2018 at Unknown time  . busPIRone (BUSPAR) 10 MG tablet Take 1 tablet (10 mg total) by mouth 2 (two) times daily. 60 tablet 2 11/12/2018 at Unknown time  . Prenatal Vit-Fe Fumarate-FA (PRENATAL MULTIVITAMIN) TABS  tablet Take 1 tablet by mouth at bedtime. 30 tablet 11 11/12/2018 at Unknown time  . butalbital-acetaminophen-caffeine (FIORICET, ESGIC) 50-325-40 MG tablet Take 1 tablet by mouth every 6 (six) hours as needed for headache. 20 tablet 0     I have reviewed patient's Past Medical Hx, Surgical Hx, Family Hx, Social Hx, medications and allergies.   ROS:  Review of Systems  Constitutional: Negative.   Eyes: Positive for visual disturbance.  Cardiovascular: Negative for leg swelling.  Gastrointestinal: Negative for abdominal pain, constipation, diarrhea, nausea and vomiting.  Genitourinary: Negative.   Neurological: Positive for headaches.    Physical Exam   Patient Vitals for the past 24 hrs:  BP Temp Temp src Pulse Resp SpO2  11/13/18 1931 121/75 -- -- 74 -- --  11/13/18 1902 110/67 -- -- 73  -- --  11/13/18 1846 114/79 -- -- 80 -- 99 %  11/13/18 1815 126/80 -- -- 80 -- --  11/13/18 1801 121/84 -- -- 88 -- --  11/13/18 1756 123/85 -- -- 90 -- 97 %  11/13/18 1750 114/64 -- -- 79 -- --  11/13/18 1725 136/84 98.3 F (36.8 C) Oral 81 18 99 %    Constitutional: Well-developed, well-nourished female in no acute distress.  Cardiovascular: normal rate & rhythm, no murmur Respiratory: normal effort, lung sounds clear throughout GI: Abd soft, non-tender, gravid appropriate for gestational age. Pos BS x 4 MS: Extremities nontender, no edema, normal ROM Neurologic: Alert and oriented x 4. No clonus  NST:  Baseline: 125 bpm, Variability: Good {> 6 bpm), Accelerations: Reactive and Decelerations: Absent   Labs: Results for orders placed or performed during the hospital encounter of 11/13/18 (from the past 24 hour(s))  Urinalysis, Routine w reflex microscopic     Status: Abnormal   Collection Time: 11/13/18  5:24 PM  Result Value Ref Range   Color, Urine YELLOW YELLOW   APPearance CLOUDY (A) CLEAR   Specific Gravity, Urine 1.039 (H) 1.005 - 1.030   pH 5.0 5.0 - 8.0   Glucose, UA NEGATIVE NEGATIVE mg/dL   Hgb urine dipstick NEGATIVE NEGATIVE   Bilirubin Urine SMALL (A) NEGATIVE   Ketones, ur NEGATIVE NEGATIVE mg/dL   Protein, ur 30 (A) NEGATIVE mg/dL   Nitrite NEGATIVE NEGATIVE   Leukocytes, UA MODERATE (A) NEGATIVE   RBC / HPF 0-5 0 - 5 RBC/hpf   WBC, UA >50 (H) 0 - 5 WBC/hpf   Bacteria, UA RARE (A) NONE SEEN   Squamous Epithelial / LPF 21-50 0 - 5   Mucus PRESENT    Ca Oxalate Crys, UA PRESENT   Protein / creatinine ratio, urine     Status: None   Collection Time: 11/13/18  5:24 PM  Result Value Ref Range   Creatinine, Urine 303.00 mg/dL   Total Protein, Urine 36 mg/dL   Protein Creatinine Ratio 0.12 0.00 - 0.15 mg/mg[Cre]  CBC     Status: Abnormal   Collection Time: 11/13/18  6:05 PM  Result Value Ref Range   WBC 7.3 4.0 - 10.5 K/uL   RBC 3.92 3.87 - 5.11 MIL/uL    Hemoglobin 11.7 (L) 12.0 - 15.0 g/dL   HCT 36.6 36.0 - 46.0 %   MCV 93.4 80.0 - 100.0 fL   MCH 29.8 26.0 - 34.0 pg   MCHC 32.0 30.0 - 36.0 g/dL   RDW 14.0 11.5 - 15.5 %   Platelets 186 150 - 400 K/uL   nRBC 0.0 0.0 - 0.2 %  Comprehensive metabolic  panel     Status: Abnormal   Collection Time: 11/13/18  6:05 PM  Result Value Ref Range   Sodium 136 135 - 145 mmol/L   Potassium 3.8 3.5 - 5.1 mmol/L   Chloride 104 98 - 111 mmol/L   CO2 24 22 - 32 mmol/L   Glucose, Bld 110 (H) 70 - 99 mg/dL   BUN 13 6 - 20 mg/dL   Creatinine, Ser 0.67 0.44 - 1.00 mg/dL   Calcium 8.8 (L) 8.9 - 10.3 mg/dL   Total Protein 6.3 (L) 6.5 - 8.1 g/dL   Albumin 3.1 (L) 3.5 - 5.0 g/dL   AST 13 (L) 15 - 41 U/L   ALT 13 0 - 44 U/L   Alkaline Phosphatase 77 38 - 126 U/L   Total Bilirubin 0.6 0.3 - 1.2 mg/dL   GFR calc non Af Amer >60 >60 mL/min   GFR calc Af Amer >60 >60 mL/min   Anion gap 8 5 - 15    Imaging:  No results found.  MAU Course: Orders Placed This Encounter  Procedures  . Urinalysis, Routine w reflex microscopic  . CBC  . Comprehensive metabolic panel  . Protein / creatinine ratio, urine  . Discharge patient   Meds ordered this encounter  Medications  . acetaminophen (TYLENOL) tablet 1,000 mg  . cyclobenzaprine (FLEXERIL) tablet 10 mg    MDM: Per EMS, pt normotensive en route to hospital Tylenol & flexeril for headache PEC labs normal. Pt normotensive throughout MAU visit. Headache improved with meds.   Assessment: 1. Pregnancy headache in third trimester   2. [redacted] weeks gestation of pregnancy     Plan: Discharge home in stable condition.  Discussed reasons to return to MAU  Msg to [facility] for f/u appt in office next week --- pt has missed several of her previous OB appts  Follow-up Information    [facility] Follow up.   Contact information: [place] [id number] [phone]          Allergies as of 11/13/2018       Reactions   Depakote [divalproex Sodium] Other (See Comments)   Pt states that this medication makes her blood levels toxic.     Methylphenidate Derivatives Other (See Comments)   Reaction:  Depression and anger    Neurontin [gabapentin] Other (See Comments)   Reaction:  Dizziness    Prozac [fluoxetine Hcl] Other (See Comments)   Reaction:  Anger       Medication List    TAKE these medications   ARIPiprazole 15 MG tablet Commonly known as:  ABILIFY Take 1 tablet (15 mg total) by mouth daily.   busPIRone 10 MG tablet Commonly known as:  BUSPAR Take 1 tablet (10 mg total) by mouth 2 (two) times daily.   butalbital-acetaminophen-caffeine 50-325-40 MG tablet Commonly known as:  FIORICET, ESGIC Take 1 tablet by mouth every 6 (six) hours as needed for headache.   prenatal multivitamin Tabs tablet Take 1 tablet by mouth at bedtime.       Jorje Guild, NP 11/13/2018 8:10 PM

## 2018-11-13 NOTE — MAU Note (Signed)
Pt arrive via EMS. States she had severe range BP around 3pm.  Bp with EMS were in normal range.  Bp 136/84 upon arrival.  Pt reports headache and blurry vision rates 5/10.  Took 2 tylenol around 1pm with no relief.  Pt denies LOF or vag bleeding. Reports good fm.

## 2018-11-13 NOTE — Discharge Instructions (Signed)
Fetal Movement Counts Patient Name: ________________________________________________ Patient Due Date: ____________________ What is a fetal movement count? A fetal movement count is the number of times that you feel your baby move during a certain amount of time. This may also be called a fetal kick count. A fetal movement count is recommended for every pregnant woman. You may be asked to start counting fetal movements as early as week 28 of your pregnancy. Pay attention to when your baby is most active. You may notice your baby's sleep and wake cycles. You may also notice things that make your baby move more. You should do a fetal movement count:  When your baby is normally most active.  At the same time each day.  A good time to count movements is while you are resting, after having something to eat and drink. How do I count fetal movements? 1. Find a quiet, comfortable area. Sit, or lie down on your side. 2. Write down the date, the start time and stop time, and the number of movements that you felt between those two times. Take this information with you to your health care visits. 3. For 2 hours, count kicks, flutters, swishes, rolls, and jabs. You should feel at least 10 movements during 2 hours. 4. You may stop counting after you have felt 10 movements. 5. If you do not feel 10 movements in 2 hours, have something to eat and drink. Then, keep resting and counting for 1 hour. If you feel at least 4 movements during that hour, you may stop counting. Contact a health care provider if:  You feel fewer than 4 movements in 2 hours.  Your baby is not moving like he or she usually does. Date: ____________ Start time: ____________ Stop time: ____________ Movements: ____________ Date: ____________ Start time: ____________ Stop time: ____________ Movements: ____________ Date: ____________ Start time: ____________ Stop time: ____________ Movements: ____________ Date: ____________ Start time:  ____________ Stop time: ____________ Movements: ____________ Date: ____________ Start time: ____________ Stop time: ____________ Movements: ____________ Date: ____________ Start time: ____________ Stop time: ____________ Movements: ____________ Date: ____________ Start time: ____________ Stop time: ____________ Movements: ____________ Date: ____________ Start time: ____________ Stop time: ____________ Movements: ____________ Date: ____________ Start time: ____________ Stop time: ____________ Movements: ____________ This information is not intended to replace advice given to you by your health care provider. Make sure you discuss any questions you have with your health care provider. Document Released: 12/31/2006 Document Revised: 07/30/2016 Document Reviewed: 01/10/2016 Elsevier Interactive Patient Education  2018 [place] Headache Without Cause A headache is pain or discomfort felt around the head or neck area. The specific cause of a headache may not be found. There are many causes and types of headaches. A few common ones are:  Tension headaches.  Migraine headaches.  Cluster headaches.  Chronic daily headaches.  Follow these instructions at home: Watch your condition for any changes. Take these steps to help with your condition: Managing pain  Take over-the-counter and prescription medicines only as told by your health care provider.  Lie down in a dark, quiet room when you have a headache.  If directed, apply ice to the head and neck area: ? Put ice in a plastic bag. ? Place a towel between your skin and the bag. ? Leave the ice on for 20 minutes, 2-3 times per day.  Use a heating pad or hot shower to apply heat to the head and neck area as told by your health care provider.  Keep lights  dim if bright lights bother you or make your headaches worse. Eating and drinking  Eat meals on a regular schedule.  Limit alcohol use.  Decrease the amount of caffeine  you drink, or stop drinking caffeine. General instructions  Keep all follow-up visits as told by your health care provider. This is important.  Keep a headache journal to help find out what may trigger your headaches. For example, write down: ? What you eat and drink. ? How much sleep you get. ? Any change to your diet or medicines.  Try massage or other relaxation techniques.  Limit stress.  Sit up straight, and do not tense your muscles.  Do not use tobacco products, including cigarettes, chewing tobacco, or e-cigarettes. If you need help quitting, ask your health care provider.  Exercise regularly as told by your health care provider.  Sleep on a regular schedule. Get 7-9 hours of sleep, or the amount recommended by your health care provider. Contact a health care provider if:  Your symptoms are not helped by medicine.  You have a headache that is different from the usual headache.  You have nausea or you vomit.  You have a fever. Get help right away if:  Your headache becomes severe.  You have repeated vomiting.  You have a stiff neck.  You have a loss of vision.  You have problems with speech.  You have pain in the eye or ear.  You have muscular weakness or loss of muscle control.  You lose your balance or have trouble walking.  You feel faint or pass out.  You have confusion. This information is not intended to replace advice given to you by your health care provider. Make sure you discuss any questions you have with your health care provider. Document Released: 12/01/2005 Document Revised: 05/08/2016 Document Reviewed: 03/26/2015 Elsevier Interactive Patient Education  [employer].

## 2018-11-25 ENCOUNTER — Encounter: Payer: Self-pay | Admitting: Obstetrics and Gynecology

## 2018-11-25 ENCOUNTER — Inpatient Hospital Stay (HOSPITAL_BASED_OUTPATIENT_CLINIC_OR_DEPARTMENT_OTHER): Payer: Medicare Other

## 2018-11-25 ENCOUNTER — Ambulatory Visit (INDEPENDENT_AMBULATORY_CARE_PROVIDER_SITE_OTHER): Payer: Medicare Other | Admitting: Obstetrics and Gynecology

## 2018-11-25 ENCOUNTER — Inpatient Hospital Stay (HOSPITAL_COMMUNITY)
Admission: AD | Admit: 2018-11-25 | Discharge: 2018-11-25 | Disposition: A | Discharge status: Home / Self Care | Payer: Medicare Other | Source: Ambulatory Visit | Attending: Obstetrics & Gynecology | Admitting: Obstetrics & Gynecology

## 2018-11-25 ENCOUNTER — Encounter (HOSPITAL_COMMUNITY): Payer: Self-pay

## 2018-11-25 ENCOUNTER — Other Ambulatory Visit (HOSPITAL_COMMUNITY)
Admission: RE | Admit: 2018-11-25 | Discharge: 2018-11-25 | Disposition: A | Discharge status: Home / Self Care | Payer: Medicare Other | Source: Ambulatory Visit | Attending: Family Medicine | Admitting: Family Medicine

## 2018-11-25 VITALS — BP 137/76 | HR 76 | Wt 187.0 lb

## 2018-11-25 DIAGNOSIS — O368130 Decreased fetal movements, third trimester, not applicable or unspecified: Secondary | ICD-10-CM

## 2018-11-25 DIAGNOSIS — Z3A33 33 weeks gestation of pregnancy: Secondary | ICD-10-CM

## 2018-11-25 DIAGNOSIS — O09299 Supervision of pregnancy with other poor reproductive or obstetric history, unspecified trimester: Secondary | ICD-10-CM

## 2018-11-25 DIAGNOSIS — O09293 Supervision of pregnancy with other poor reproductive or obstetric history, third trimester: Secondary | ICD-10-CM | POA: Diagnosis not present

## 2018-11-25 DIAGNOSIS — Z8632 Personal history of gestational diabetes: Secondary | ICD-10-CM

## 2018-11-25 DIAGNOSIS — R102 Pelvic and perineal pain unspecified side: Secondary | ICD-10-CM

## 2018-11-25 DIAGNOSIS — O09219 Supervision of pregnancy with history of pre-term labor, unspecified trimester: Secondary | ICD-10-CM

## 2018-11-25 DIAGNOSIS — O09893 Supervision of other high risk pregnancies, third trimester: Secondary | ICD-10-CM | POA: Diagnosis not present

## 2018-11-25 DIAGNOSIS — R030 Elevated blood-pressure reading, without diagnosis of hypertension: Secondary | ICD-10-CM

## 2018-11-25 DIAGNOSIS — O99343 Other mental disorders complicating pregnancy, third trimester: Secondary | ICD-10-CM | POA: Diagnosis not present

## 2018-11-25 DIAGNOSIS — O26893 Other specified pregnancy related conditions, third trimester: Secondary | ICD-10-CM | POA: Diagnosis not present

## 2018-11-25 DIAGNOSIS — O09213 Supervision of pregnancy with history of pre-term labor, third trimester: Secondary | ICD-10-CM

## 2018-11-25 DIAGNOSIS — O368120 Decreased fetal movements, second trimester, not applicable or unspecified: Secondary | ICD-10-CM

## 2018-11-25 DIAGNOSIS — O133 Gestational [pregnancy-induced] hypertension without significant proteinuria, third trimester: Secondary | ICD-10-CM | POA: Diagnosis not present

## 2018-11-25 DIAGNOSIS — O0993 Supervision of high risk pregnancy, unspecified, third trimester: Secondary | ICD-10-CM

## 2018-11-25 DIAGNOSIS — O09899 Supervision of other high risk pregnancies, unspecified trimester: Secondary | ICD-10-CM

## 2018-11-25 LAB — COMPREHENSIVE METABOLIC PANEL
ALT: 9 U/L (ref 0–44)
AST: 13 U/L — ABNORMAL LOW (ref 15–41)
Albumin: 3.6 g/dL (ref 3.5–5.0)
Alkaline Phosphatase: 99 U/L (ref 38–126)
Anion gap: 10 (ref 5–15)
BUN: 11 mg/dL (ref 6–20)
CO2: 23 mmol/L (ref 22–32)
Calcium: 8.9 mg/dL (ref 8.9–10.3)
Chloride: 101 mmol/L (ref 98–111)
Creatinine, Ser: 0.72 mg/dL (ref 0.44–1.00)
GFR calc Af Amer: >60 mL/min (ref >60)
GFR calc non Af Amer: >60 mL/min (ref >60)
Glucose, Bld: 88 mg/dL (ref 70–99)
Potassium: 3.5 mmol/L (ref 3.5–5.1)
Sodium: 134 mmol/L — ABNORMAL LOW (ref 135–145)
Total Bilirubin: 0.5 mg/dL (ref 0.3–1.2)
Total Protein: 7.7 g/dL (ref 6.5–8.1)

## 2018-11-25 LAB — CBC
HCT: 37.9 % (ref 36.0–46.0)
Hemoglobin: 12.1 g/dL (ref 12.0–15.0)
MCH: 29.6 pg (ref 26.0–34.0)
MCHC: 31.9 g/dL (ref 30.0–36.0)
MCV: 92.7 fL (ref 80.0–100.0)
Platelets: 165 10*3/uL (ref 150–400)
RBC: 4.09 MIL/uL (ref 3.87–5.11)
RDW: 14.1 % (ref 11.5–15.5)
WBC: 7.1 10*3/uL (ref 4.0–10.5)
nRBC: 0 % (ref 0.0–0.2)

## 2018-11-25 LAB — PROTEIN / CREATININE RATIO, URINE
Creatinine, Urine: 176 mg/dL
Protein Creatinine Ratio: 0.19 mg/mg{Cre} — ABNORMAL HIGH (ref 0.00–0.15)
Total Protein, Urine: 34 mg/dL

## 2018-11-25 LAB — OB RESULTS CONSOLE GC/CHLAMYDIA: Gonorrhea: NEGATIVE

## 2018-11-25 NOTE — MAU Note (Signed)
Pt had ultrasound pictures in her possession and said her ride was already here and she had to go. I told her she wasn't discharged yet and they were trying to schedule her for a BP check next week. She said she would see the appointment in My Chart and it had to be at least 3 days out for insurance. Pt left before I could have her sign.

## 2018-11-25 NOTE — Discharge Instructions (Signed)
-  [facility] will call you for a blood pressure check next week on Wednesday! Keep this appt!  -Return to MAU if you develop headache not relieved by water, rest, tylenol -Return to MAU if you develop blurry vision, floating spots in your visit, pain in your upper right abdomen   Fetal Movement Counts Patient Name: ________________________________________________ Patient Due Date: ____________________ What is a fetal movement count? A fetal movement count is the number of times that you feel your baby move during a certain amount of time. This may also be called a fetal kick count. A fetal movement count is recommended for every pregnant woman. You may be asked to start counting fetal movements as early as week 28 of your pregnancy. Pay attention to when your baby is most active. You may notice your baby's sleep and wake cycles. You may also notice things that make your baby move more. You should do a fetal movement count:  When your baby is normally most active.  At the same time each day.  A good time to count movements is while you are resting, after having something to eat and drink. How do I count fetal movements? 1. Find a quiet, comfortable area. Sit, or lie down on your side. 2. Write down the date, the start time and stop time, and the number of movements that you felt between those two times. Take this information with you to your health care visits. 3. For 2 hours, count kicks, flutters, swishes, rolls, and jabs. You should feel at least 10 movements during 2 hours. 4. You may stop counting after you have felt 10 movements. 5. If you do not feel 10 movements in 2 hours, have something to eat and drink. Then, keep resting and counting for 1 hour. If you feel at least 4 movements during that hour, you may stop counting. Contact a health care provider if:  You feel fewer than 4 movements in 2 hours.  Your baby is not moving like he or she usually does. Date: ____________ Start time:  ____________ Stop time: ____________ Movements: ____________ Date: ____________ Start time: ____________ Stop time: ____________ Movements: ____________ Date: ____________ Start time: ____________ Stop time: ____________ Movements: ____________ Date: ____________ Start time: ____________ Stop time: ____________ Movements: ____________ Date: ____________ Start time: ____________ Stop time: ____________ Movements: ____________ Date: ____________ Start time: ____________ Stop time: ____________ Movements: ____________ Date: ____________ Start time: ____________ Stop time: ____________ Movements: ____________ Date: ____________ Start time: ____________ Stop time: ____________ Movements: ____________ Date: ____________ Start time: ____________ Stop time: ____________ Movements: ____________ This information is not intended to replace advice given to you by your health care provider. Make sure you discuss any questions you have with your health care provider. Document Released: 12/31/2006 Document Revised: 07/30/2016 Document Reviewed: 01/10/2016 Elsevier Interactive Patient Education  [employer].
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
## 2018-11-25 NOTE — MAU Provider Note (Addendum)
Patient [name] is a 32 y.o.  [phone] At [redacted]w[redacted]d here after reporting decreased fetal movements for 5 days to her provider today. She reports some increased discharge, but no LOF, bleeding. She has not felt the baby move since she has been in MAU.  She denies contractions, HA, blurry vision, NV, low back pain.   History     CSN: [id number]  Arrival date and time: 11/25/18 1609   First Provider Initiated Contact with Patient 11/25/18 1654      Chief Complaint  Patient presents with  . Decreased Fetal Movement   HPI Patient reports that she has not felt the baby move for 5 days. She thought that this was normal. She drank water and juice; it did not help. She has missed several doctors visits; she has transportation issues.     OB History    Gravida  4   Para  3   Term  2   Preterm  1   AB  0   Living  3     SAB  0   TAB      Ectopic      Multiple  0   Live Births  3           Past Medical History:  Diagnosis Date  . Anxiety   . Asthma    inhaler PRN-hasn't used inhaler in months  . Attention deficit hyperactivity disorder   . Bipolar disorder [facility])    hospitalized as a teen for suicidal ideations and cutting  . Depression   . Gastritis   . GERD (gastroesophageal reflux disease)   . HPV (human papilloma virus) anogenital infection   . Hx of varicella   . Mild preeclampsia 02/09/2016  . Vaginal Pap smear, abnormal     Past Surgical History:  Procedure Laterality Date  . EYE MUSCLE SURGERY Bilateral    07/29/12  . MOUTH SURGERY    . PLANTAR FASCIA SURGERY    . WISDOM TOOTH EXTRACTION      Family History  Problem Relation Age of Onset  . Depression Mother   . Colon cancer Mother   . Colon polyps Mother   . Depression Father   . Depression Brother   . Liver disease Other   . Kidney disease Other     Social History   Tobacco Use  . Smoking status: Never Smoker  . Smokeless tobacco: Never Used  Substance Use Topics  .  Alcohol use: No    Alcohol/week: 0.0 standard drinks  . Drug use: No    Allergies:  Allergies  Allergen Reactions  . Depakote [Divalproex Sodium] Other (See Comments)    Pt states that this medication makes her blood levels toxic.    [name] Methylphenidate Derivatives Other (See Comments)    Reaction:  Depression and anger   . Neurontin [Gabapentin] Other (See Comments)    Reaction:  Dizziness   . Prozac [Fluoxetine Hcl] Other (See Comments)    Reaction:  Anger     Medications Prior to Admission  Medication Sig Dispense Refill Last Dose  . ARIPiprazole (ABILIFY) 15 MG tablet Take 1 tablet (15 mg total) by mouth daily. 30 tablet 2 Taking  . busPIRone (BUSPAR) 10 MG tablet Take 1 tablet (10 mg total) by mouth 2 (two) times daily. 60 tablet 2 Taking  . butalbital-acetaminophen-caffeine (FIORICET, ESGIC) 50-325-40 MG tablet Take 1 tablet by mouth every 6 (six) hours as needed for headache. (Patient not taking: Reported on 11/25/2018)  20 tablet 0 Not Taking  . Prenatal Vit-Fe Fumarate-FA (PRENATAL MULTIVITAMIN) TABS tablet Take 1 tablet by mouth at bedtime. 30 tablet 11 Taking    Review of Systems  Constitutional: Negative.   HENT: Negative.   Respiratory: Negative.   Cardiovascular: Negative.   Gastrointestinal: Negative.   Genitourinary: Negative.   Musculoskeletal: Negative.   Neurological: Negative.    Physical Exam   Blood pressure 133/84, pulse 78, temperature 98.5 F (36.9 C), temperature source Oral, resp. rate 16, height 5\' 4"  (1.626 m), weight 84.8 kg, last menstrual period 04/02/2018, SpO2 99 %, unknown if currently breastfeeding.  Physical Exam  Constitutional: She is oriented to person, place, and time. She appears well-developed.  HENT:  Head: Normocephalic.  Neck: Normal range of motion.  GI: Soft.  Musculoskeletal: Normal range of motion.  Neurological: She is alert and oriented to person, place, and time.  cervis is 1 cm, posterior, thick.   MAU Course   Procedures  MDM  Patient Vitals for the past 24 hrs:  BP Temp Temp src Pulse Resp SpO2 Height Weight  11/25/18 1716 (!) 131/93 - - 79 - - - -  11/25/18 1701 133/88 - - 80 - - - -  11/25/18 1658 133/84 - - 78 - - - -  11/25/18 1646 128/89 - - 84 - - - -  11/25/18 1637 135/80 - - 88 - - - -  11/25/18 1621 (!) 122/97 98.5 F (36.9 C) Oral (!) 111 16 99 % 5\' 4"  (1.626 m) 84.8 kg   -Patient is very anxious to be induced at 37 weeks; she is curious if her BP meets criteria. Reviewed importance of accurate BP readings. Reviewed BP readings from prenatal course this pregnancy, no elevated BPs so far in this pregnancy.  - Patient is requesting that we strip her membranes today; I refused to do so.  -Patient requesting cervical exam; she is 1 cm, thick, posterior.  -Pre-e labs are normal -NST: 130 bpm, mod var, present acel, neg decels, no contractions.  -BPP 8/8, which is reassuring.   Assessment and Plan    1. Transient hypertension    2. Patient stable for discharge. Patient does not think that she can make BP check next week; she can check at home. Strongly emphasized that she keep BP appt next week; she left before she could get all of her discharge teaching and paperwork. I was able to discuss Fetal Kick Counts briefly before she left.   3. Message sent to [facility] to have her scheduled for a BP check.     Mervyn Skeeters Eldon Zietlow 11/25/2018, 5:01 PM

## 2018-11-25 NOTE — MAU Note (Signed)
Pt sent over form office due to decreased fetal movement. Pt states she is supposed to have an ultrasound

## 2018-11-25 NOTE — Progress Notes (Signed)
Headache, blurred vision, no fetal movement x 5 days.

## 2018-11-25 NOTE — Addendum Note (Signed)
Addended by: Courtney Heys on: 11/25/2018 03:55 PM   Modules accepted: Orders

## 2018-11-26 LAB — CERVICOVAGINAL ANCILLARY ONLY
Bacterial vaginitis: NEGATIVE
Candida vaginitis: NEGATIVE
Chlamydia: NEGATIVE
Neisseria Gonorrhea: NEGATIVE
Trichomonas: NEGATIVE

## 2018-12-01 ENCOUNTER — Ambulatory Visit (INDEPENDENT_AMBULATORY_CARE_PROVIDER_SITE_OTHER): Payer: Medicare Other | Admitting: Obstetrics and Gynecology

## 2018-12-01 VITALS — BP 126/92 | HR 130 | Wt 182.4 lb

## 2018-12-01 DIAGNOSIS — O09893 Supervision of other high risk pregnancies, third trimester: Secondary | ICD-10-CM

## 2018-12-01 DIAGNOSIS — O09219 Supervision of pregnancy with history of pre-term labor, unspecified trimester: Secondary | ICD-10-CM

## 2018-12-01 DIAGNOSIS — O139 Gestational [pregnancy-induced] hypertension without significant proteinuria, unspecified trimester: Secondary | ICD-10-CM | POA: Insufficient documentation

## 2018-12-01 DIAGNOSIS — O09899 Supervision of other high risk pregnancies, unspecified trimester: Secondary | ICD-10-CM

## 2018-12-01 DIAGNOSIS — O0993 Supervision of high risk pregnancy, unspecified, third trimester: Secondary | ICD-10-CM

## 2018-12-01 DIAGNOSIS — Z8632 Personal history of gestational diabetes: Secondary | ICD-10-CM

## 2018-12-01 DIAGNOSIS — O09293 Supervision of pregnancy with other poor reproductive or obstetric history, third trimester: Secondary | ICD-10-CM

## 2018-12-01 DIAGNOSIS — O133 Gestational [pregnancy-induced] hypertension without significant proteinuria, third trimester: Secondary | ICD-10-CM

## 2018-12-01 DIAGNOSIS — Z3009 Encounter for other general counseling and advice on contraception: Secondary | ICD-10-CM

## 2018-12-01 DIAGNOSIS — O09299 Supervision of pregnancy with other poor reproductive or obstetric history, unspecified trimester: Secondary | ICD-10-CM

## 2018-12-01 NOTE — Progress Notes (Signed)
Pt is here for ROB. [id number] [redacted]w[redacted]d.
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
## 2018-12-01 NOTE — Patient Instructions (Signed)
Hypertension During Pregnancy ° °Hypertension is also called high blood pressure. High blood pressure means that the force of your blood moving in your body is too strong. When you are pregnant, this condition should be watched carefully. It can cause problems for you and your baby. °Follow these instructions at home: °Eating and drinking ° °· Drink enough fluid to keep your pee (urine) pale yellow. °· Avoid caffeine. °Lifestyle °· Do not use any products that contain nicotine or tobacco, such as cigarettes and e-cigarettes. If you need help quitting, ask your doctor. °· Do not use alcohol or drugs. °· Avoid stress. °· Rest and get plenty of sleep. °General instructions °· Take over-the-counter and prescription medicines only as told by your doctor. °· While lying down, lie on your left side. This keeps pressure off your major blood vessels. °· While sitting or lying down, raise (elevate) your feet. Try putting some pillows under your lower legs. °· Exercise regularly. Ask your doctor what kinds of exercise are best for you. °· Keep all prenatal and follow-up visits as told by your doctor. This is important. °Contact a doctor if: °· You have symptoms that your doctor told you to watch for, such as: °? Throwing up (vomiting). °? Feeling sick to your stomach (nausea). °? Headache. °Get help right away if you have: °· Very bad belly pain that does not get better with treatment. °· A very bad headache that does not get better. °· Throwing up that does not get better with treatment. °· Sudden, fast weight gain. °· Sudden swelling in your hands, ankles, or face. °· Bleeding from your vagina. °· Blood in your pee. °· Fewer movements from your baby than usual. °· Blurry vision. °· Double vision. °· Muscle twitching. °· Sudden muscle tightening (spasms). °· Trouble breathing. °· Blue fingernails or lips. °Summary °· Hypertension is also called high blood pressure. High blood pressure means that the force of your blood moving  in your body is too strong. °· When you are pregnant, this condition should be watched carefully. It can cause problems for you and your baby. °· Get help right away if you have symptoms that your doctor told you to watch for. °This information is not intended to replace advice given to you by your health care provider. Make sure you discuss any questions you have with your health care provider. °Document Released: 01/03/2011 Document Revised: 11/17/2017 Document Reviewed: 08/12/2016 °Elsevier Interactive Patient Education © 2019 [employer] ° °

## 2018-12-02 ENCOUNTER — Ambulatory Visit (HOSPITAL_COMMUNITY): Admission: RE | Admit: 2018-12-02 | Payer: Medicare Other | Source: Ambulatory Visit

## 2018-12-02 LAB — COMPREHENSIVE METABOLIC PANEL
ALT: 6 IU/L (ref 0–32)
AST: 13 IU/L (ref 0–40)
Albumin/Globulin Ratio: 1.5 (ref 1.2–2.2)
Albumin: 4 g/dL (ref 3.5–5.5)
Alkaline Phosphatase: 127 IU/L — ABNORMAL HIGH (ref 39–117)
BUN/Creatinine Ratio: 11 (ref 9–23)
BUN: 10 mg/dL (ref 6–20)
Bilirubin Total: 0.5 mg/dL (ref 0.0–1.2)
CO2: 22 mmol/L (ref 20–29)
Calcium: 9.3 mg/dL (ref 8.7–10.2)
Chloride: 102 mmol/L (ref 96–106)
Creatinine, Ser: 0.9 mg/dL (ref 0.57–1.00)
GFR calc Af Amer: 99 mL/min/{1.73_m2} (ref >59)
GFR calc non Af Amer: 85 mL/min/{1.73_m2} (ref >59)
Globulin, Total: 2.6 g/dL (ref 1.5–4.5)
Glucose: 133 mg/dL — ABNORMAL HIGH (ref 65–99)
Potassium: 4.6 mmol/L (ref 3.5–5.2)
Sodium: 138 mmol/L (ref 134–144)
Total Protein: 6.6 g/dL (ref 6.0–8.5)

## 2018-12-02 LAB — PROTEIN / CREATININE RATIO, URINE
Creatinine, Urine: 297.7 mg/dL
Protein, Ur: 96.1 mg/dL
Protein/Creat Ratio: 323 mg/g creat — ABNORMAL HIGH (ref 0–200)

## 2018-12-02 LAB — CBC
Hematocrit: 37.3 % (ref 34.0–46.6)
Hemoglobin: 12.8 g/dL (ref 11.1–15.9)
MCH: 29.9 pg (ref 26.6–33.0)
MCHC: 34.3 g/dL (ref 31.5–35.7)
MCV: 87 fL (ref 79–97)
Platelets: 178 10*3/uL (ref 150–450)
RBC: 4.28 x10E6/uL (ref 3.77–5.28)
RDW: 13.9 % (ref 12.3–15.4)
WBC: 7.6 10*3/uL (ref 3.4–10.8)

## 2018-12-02 LAB — GLUCOSE TOLERANCE, 1 HOUR: Glucose, 1Hr PP: 135 mg/dL (ref 65–199)

## 2018-12-02 LAB — HIV ANTIBODY (ROUTINE TESTING W REFLEX): HIV Screen 4th Generation wRfx: NONREACTIVE

## 2018-12-02 LAB — RPR: RPR Ser Ql: NONREACTIVE

## 2018-12-03 ENCOUNTER — Other Ambulatory Visit: Payer: Self-pay

## 2018-12-04 ENCOUNTER — Inpatient Hospital Stay (HOSPITAL_COMMUNITY)
Admission: AD | Admit: 2018-12-04 | Discharge: 2018-12-04 | Disposition: A | Discharge status: Home / Self Care | Payer: Medicare Other | Source: Ambulatory Visit | Attending: Obstetrics and Gynecology | Admitting: Obstetrics and Gynecology

## 2018-12-04 ENCOUNTER — Encounter (HOSPITAL_COMMUNITY): Payer: Self-pay

## 2018-12-04 DIAGNOSIS — J45909 Unspecified asthma, uncomplicated: Secondary | ICD-10-CM | POA: Diagnosis not present

## 2018-12-04 DIAGNOSIS — O99513 Diseases of the respiratory system complicating pregnancy, third trimester: Secondary | ICD-10-CM | POA: Insufficient documentation

## 2018-12-04 DIAGNOSIS — F319 Bipolar disorder, unspecified: Secondary | ICD-10-CM | POA: Diagnosis not present

## 2018-12-04 DIAGNOSIS — O99613 Diseases of the digestive system complicating pregnancy, third trimester: Secondary | ICD-10-CM | POA: Insufficient documentation

## 2018-12-04 DIAGNOSIS — Z79899 Other long term (current) drug therapy: Secondary | ICD-10-CM | POA: Diagnosis not present

## 2018-12-04 DIAGNOSIS — K219 Gastro-esophageal reflux disease without esophagitis: Secondary | ICD-10-CM | POA: Diagnosis not present

## 2018-12-04 DIAGNOSIS — O99343 Other mental disorders complicating pregnancy, third trimester: Secondary | ICD-10-CM | POA: Insufficient documentation

## 2018-12-04 DIAGNOSIS — I10 Essential (primary) hypertension: Secondary | ICD-10-CM | POA: Diagnosis not present

## 2018-12-04 DIAGNOSIS — Z3A35 35 weeks gestation of pregnancy: Secondary | ICD-10-CM | POA: Insufficient documentation

## 2018-12-04 DIAGNOSIS — O133 Gestational [pregnancy-induced] hypertension without significant proteinuria, third trimester: Secondary | ICD-10-CM

## 2018-12-04 DIAGNOSIS — O4703 False labor before 37 completed weeks of gestation, third trimester: Secondary | ICD-10-CM | POA: Diagnosis not present

## 2018-12-04 DIAGNOSIS — F419 Anxiety disorder, unspecified: Secondary | ICD-10-CM | POA: Insufficient documentation

## 2018-12-04 LAB — COMPREHENSIVE METABOLIC PANEL
ALT: 8 U/L (ref 0–44)
AST: 16 U/L (ref 15–41)
Albumin: 3.2 g/dL — ABNORMAL LOW (ref 3.5–5.0)
Alkaline Phosphatase: 100 U/L (ref 38–126)
Anion gap: 7 (ref 5–15)
BUN: 9 mg/dL (ref 6–20)
CO2: 24 mmol/L (ref 22–32)
Calcium: 8.8 mg/dL — ABNORMAL LOW (ref 8.9–10.3)
Chloride: 105 mmol/L (ref 98–111)
Creatinine, Ser: 0.7 mg/dL (ref 0.44–1.00)
GFR calc Af Amer: >60 mL/min (ref >60)
GFR calc non Af Amer: >60 mL/min (ref >60)
Glucose, Bld: 73 mg/dL (ref 70–99)
Potassium: 4 mmol/L (ref 3.5–5.1)
Sodium: 136 mmol/L (ref 135–145)
Total Bilirubin: 0.8 mg/dL (ref 0.3–1.2)
Total Protein: 7.2 g/dL (ref 6.5–8.1)

## 2018-12-04 LAB — URINALYSIS, ROUTINE W REFLEX MICROSCOPIC
Bilirubin Urine: NEGATIVE
Glucose, UA: NEGATIVE mg/dL
Hgb urine dipstick: NEGATIVE
Ketones, ur: NEGATIVE mg/dL
Nitrite: NEGATIVE
Protein, ur: 100 mg/dL — AB
Specific Gravity, Urine: 1.019 (ref 1.005–1.030)
pH: 6 (ref 5.0–8.0)

## 2018-12-04 LAB — CBC
HCT: 38 % (ref 36.0–46.0)
Hemoglobin: 12 g/dL (ref 12.0–15.0)
MCH: 29.5 pg (ref 26.0–34.0)
MCHC: 31.6 g/dL (ref 30.0–36.0)
MCV: 93.4 fL (ref 80.0–100.0)
Platelets: 154 10*3/uL (ref 150–400)
RBC: 4.07 MIL/uL (ref 3.87–5.11)
RDW: 14.3 % (ref 11.5–15.5)
WBC: 8.5 10*3/uL (ref 4.0–10.5)
nRBC: 0 % (ref 0.0–0.2)

## 2018-12-04 LAB — PROTEIN / CREATININE RATIO, URINE
Creatinine, Urine: 261 mg/dL
Protein Creatinine Ratio: 0.18 mg/mg{Cre} — ABNORMAL HIGH (ref 0.00–0.15)
Total Protein, Urine: 46 mg/dL

## 2018-12-04 NOTE — MAU Provider Note (Signed)
Chief Complaint:  Contractions and Decreased Fetal Movement   None     HPI: [name] is a 32 y.o. [phone] at [redacted]w[redacted]d who presents to maternity admissions via EMS for contractions. She reports pain in her abdomen and back that is often and started 2 days ago. The pain is intermittent cramping pain in her low abdomen that radiates to her low back. She is unable to report how close together or how many times in 1 hour these cramps occur.  She denies any other symptoms and has no h/a, epigastric pain, or visual disturbances.  She is diagnosed with GHTN with plan for IOL at 37 weeks. She reports good fetal movement, denies LOF, vaginal bleeding, vaginal itching/burning, urinary symptoms, h/a, dizziness, n/v, or fever/chills.    HPI  Past Medical History: Past Medical History:  Diagnosis Date  . Anxiety   . Asthma    inhaler PRN-hasn't used inhaler in months  . Attention deficit hyperactivity disorder   . Bipolar disorder [facility])    hospitalized as a teen for suicidal ideations and cutting  . Depression   . Gastritis   . GERD (gastroesophageal reflux disease)   . HPV (human papilloma virus) anogenital infection   . Hx of varicella   . Mild preeclampsia 02/09/2016  . Vaginal Pap smear, abnormal     Past obstetric history: OB History  Gravida Para Term Preterm AB Living  4 3 2 1  0 3  SAB TAB Ectopic Multiple Live Births  0     0 3    # Outcome Date GA Lbr Len/2nd Weight Sex Delivery Anes PTL Lv  4 Current           3 Term 02/19/18 [redacted]w[redacted]d 946:02 / 01:46 3790 g M Vag-Spont EPI  LIV  2 Preterm 02/03/17 [redacted]w[redacted]d  3400 g M Vag-Spont EPI  LIV     Birth Comments: pre eclampsia  1 Term 02/11/16 [redacted]w[redacted]d / 01:14 3702 g F Vag-Spont EPI  LIV    Past Surgical History: Past Surgical History:  Procedure Laterality Date  . EYE MUSCLE SURGERY Bilateral    07/29/12  . MOUTH SURGERY    . PLANTAR FASCIA SURGERY    . WISDOM TOOTH EXTRACTION      Family History: Family History  Problem  Relation Age of Onset  . Depression Mother   . Colon cancer Mother   . Colon polyps Mother   . Depression Father   . Depression Brother   . Liver disease Other   . Kidney disease Other     Social History: Social History   Tobacco Use  . Smoking status: Never Smoker  . Smokeless tobacco: Never Used  Substance Use Topics  . Alcohol use: No    Alcohol/week: 0.0 standard drinks  . Drug use: No    Allergies:  Allergies  Allergen Reactions  . Depakote [Divalproex Sodium] Other (See Comments)    Pt states that this medication makes her blood levels toxic.    [name] Methylphenidate Derivatives Other (See Comments)    Reaction:  Depression and anger   . Neurontin [Gabapentin] Other (See Comments)    Reaction:  Dizziness   . Prozac [Fluoxetine Hcl] Other (See Comments)    Reaction:  Anger     Meds:  Medications Prior to Admission  Medication Sig Dispense Refill Last Dose  . ARIPiprazole (ABILIFY) 15 MG tablet Take 1 tablet (15 mg total) by mouth daily. 30 tablet 2 Taking  . busPIRone (BUSPAR) 10 MG  tablet Take 1 tablet (10 mg total) by mouth 2 (two) times daily. 60 tablet 2 Taking  . butalbital-acetaminophen-caffeine (FIORICET, ESGIC) 50-325-40 MG tablet Take 1 tablet by mouth every 6 (six) hours as needed for headache. (Patient not taking: Reported on 11/25/2018) 20 tablet 0 Not Taking  . Prenatal Vit-Fe Fumarate-FA (PRENATAL MULTIVITAMIN) TABS tablet Take 1 tablet by mouth at bedtime. 30 tablet 11 Taking    ROS:  Review of Systems  Constitutional: Negative for chills, fatigue and fever.  Eyes: Negative for visual disturbance.  Respiratory: Negative for shortness of breath.   Cardiovascular: Negative for chest pain.  Gastrointestinal: Negative for abdominal pain, nausea and vomiting.  Genitourinary: Negative for difficulty urinating, dysuria, flank pain, pelvic pain, vaginal bleeding, vaginal discharge and vaginal pain.  Neurological: Negative for dizziness and headaches.   Psychiatric/Behavioral: Negative.      I have reviewed patient's Past Medical Hx, Surgical Hx, Family Hx, Social Hx, medications and allergies.   Physical Exam   Patient Vitals for the past 24 hrs:  BP Temp Temp src Pulse Resp SpO2 Height Weight  12/04/18 2004 117/84 99.3 F (37.4 C) Oral 70 20 99 % -- --  12/04/18 1800 126/89 -- -- (!) 107 -- -- -- --  12/04/18 1752 (!) 136/99 -- -- (!) 112 -- -- -- --  12/04/18 1740 (!) 138/95 -- -- (!) 115 -- -- -- --  12/04/18 1731 (!) 130/95 98.5 F (36.9 C) Oral (!) 105 18 -- -- --  12/04/18 1720 -- -- -- -- -- -- 5\' 4"  (1.626 m) 85 kg   Constitutional: Well-developed, well-nourished female in no acute distress.  Cardiovascular: normal rate Respiratory: normal effort GI: Abd soft, non-tender, gravid appropriate for gestational age.  MS: Extremities nontender, no edema, normal ROM Neurologic: Alert and oriented x 4.  GU: Neg CVAT.   Dilation: 2 Effacement (%): Thick Cervical Position: Posterior Station: -3 Presentation: Vertex Exam by:: leftwich kirby cnm  FHT:  Baseline 135 , moderate variability, accelerations present, no decelerations Contractions: irritability initially, then irregular contractions, mild to palpation   Labs: Results for orders placed or performed during the hospital encounter of 12/04/18 (from the past 24 hour(s))  Urinalysis, Routine w reflex microscopic     Status: Abnormal   Collection Time: 12/04/18  5:22 PM  Result Value Ref Range   Color, Urine YELLOW YELLOW   APPearance HAZY (A) CLEAR   Specific Gravity, Urine 1.019 1.005 - 1.030   pH 6.0 5.0 - 8.0   Glucose, UA NEGATIVE NEGATIVE mg/dL   Hgb urine dipstick NEGATIVE NEGATIVE   Bilirubin Urine NEGATIVE NEGATIVE   Ketones, ur NEGATIVE NEGATIVE mg/dL   Protein, ur 100 (A) NEGATIVE mg/dL   Nitrite NEGATIVE NEGATIVE   Leukocytes, UA SMALL (A) NEGATIVE   RBC / HPF 0-5 0 - 5 RBC/hpf   WBC, UA 11-20 0 - 5 WBC/hpf   Bacteria, UA RARE (A) NONE SEEN    Squamous Epithelial / LPF 21-50 0 - 5   Mucus PRESENT   Protein / creatinine ratio, urine     Status: Abnormal   Collection Time: 12/04/18  5:22 PM  Result Value Ref Range   Creatinine, Urine 261.00 mg/dL   Total Protein, Urine 46 mg/dL   Protein Creatinine Ratio 0.18 (H) 0.00 - 0.15 mg/mg[Cre]  CBC     Status: None   Collection Time: 12/04/18  7:16 PM  Result Value Ref Range   WBC 8.5 4.0 - 10.5 K/uL   RBC 4.07  3.87 - 5.11 MIL/uL   Hemoglobin 12.0 12.0 - 15.0 g/dL   HCT 38.0 36.0 - 46.0 %   MCV 93.4 80.0 - 100.0 fL   MCH 29.5 26.0 - 34.0 pg   MCHC 31.6 30.0 - 36.0 g/dL   RDW 14.3 11.5 - 15.5 %   Platelets 154 150 - 400 K/uL   nRBC 0.0 0.0 - 0.2 %  Comprehensive metabolic panel     Status: Abnormal   Collection Time: 12/04/18  7:16 PM  Result Value Ref Range   Sodium 136 135 - 145 mmol/L   Potassium 4.0 3.5 - 5.1 mmol/L   Chloride 105 98 - 111 mmol/L   CO2 24 22 - 32 mmol/L   Glucose, Bld 73 70 - 99 mg/dL   BUN 9 6 - 20 mg/dL   Creatinine, Ser 0.70 0.44 - 1.00 mg/dL   Calcium 8.8 (L) 8.9 - 10.3 mg/dL   Total Protein 7.2 6.5 - 8.1 g/dL   Albumin 3.2 (L) 3.5 - 5.0 g/dL   AST 16 15 - 41 U/L   ALT 8 0 - 44 U/L   Alkaline Phosphatase 100 38 - 126 U/L   Total Bilirubin 0.8 0.3 - 1.2 mg/dL   GFR calc non Af Amer >60 >60 mL/min   GFR calc Af Amer >60 >60 mL/min   Anion gap 7 5 - 15   O/Positive/-- (07/30 1459)  Imaging:    MAU Course/MDM: NST reviewed and reactive No evidence of labor with no cervical change in 2+ hours in MAU Preeclampsia labs wnl, no s/sx of preeclampsia IOL scheduled for pt for 12/17/17, [redacted]w[redacted]d.   D/C home with labor and preeclampsia precautions Keep scheduled appt 12/23 at [place], return to MAU for labor or emergencies   Assessment: 1. Threatened preterm labor, third trimester   2. Gestational hypertension, third trimester     Plan: Discharge home Labor precautions and fetal kick counts Follow-up Information    [place] Follow  up.   Why:  As scheduled on 12/06/18. Return to MAU as needed for emergencies. Contact information: [place] [address] [place] Follow up.   Why:  Your induction of labor is scheduled at 0630 am on Friday, 12/17/2018. Please check in at MAU desk at 0545.   Contact information: [place] [phone] [phone]         Allergies as of 12/04/2018      Reactions   Depakote [divalproex Sodium] Other (See Comments)   Pt states that this medication makes her blood levels toxic.     Methylphenidate Derivatives Other (See Comments)   Reaction:  Depression and anger    Neurontin [gabapentin] Other (See Comments)   Reaction:  Dizziness    Prozac [fluoxetine Hcl] Other (See Comments)   Reaction:  Anger       Medication List    TAKE these medications   ARIPiprazole 15 MG tablet Commonly known as:  ABILIFY Take 1 tablet (15 mg total) by mouth daily.   busPIRone 10 MG tablet Commonly known as:  BUSPAR Take 1 tablet (10 mg total) by mouth 2 (two) times daily.   butalbital-acetaminophen-caffeine 50-325-40 MG tablet Commonly known as:  FIORICET, ESGIC Take 1 tablet by mouth every 6 (six) hours as needed for headache.   prenatal multivitamin Tabs tablet Take 1 tablet by mouth at bedtime.  Fatima Blank Certified Nurse-Midwife 12/04/2018 8:17 PM

## 2018-12-04 NOTE — MAU Note (Signed)
Pt arrived via EMS for contractions. States they come pretty often. No LOF, no bleeding, decreased fetal movement today

## 2018-12-04 NOTE — Discharge Instructions (Signed)

## 2018-12-06 ENCOUNTER — Encounter (HOSPITAL_COMMUNITY): Payer: Self-pay

## 2018-12-06 ENCOUNTER — Inpatient Hospital Stay (HOSPITAL_COMMUNITY)
Admission: AD | Admit: 2018-12-06 | Discharge: 2018-12-06 | Disposition: A | Discharge status: Home / Self Care | Payer: Medicare Other | Attending: Obstetrics & Gynecology | Admitting: Obstetrics & Gynecology

## 2018-12-06 ENCOUNTER — Other Ambulatory Visit: Payer: Self-pay

## 2018-12-06 ENCOUNTER — Encounter: Payer: Medicare Other | Admitting: Obstetrics and Gynecology

## 2018-12-06 ENCOUNTER — Ambulatory Visit (HOSPITAL_BASED_OUTPATIENT_CLINIC_OR_DEPARTMENT_OTHER)
Admission: RE | Admit: 2018-12-06 | Discharge: 2018-12-06 | Disposition: A | Discharge status: Home / Self Care | Payer: Medicare Other | Source: Ambulatory Visit | Attending: Obstetrics and Gynecology | Admitting: Obstetrics and Gynecology

## 2018-12-06 ENCOUNTER — Encounter (HOSPITAL_COMMUNITY): Payer: Self-pay | Admitting: *Deleted

## 2018-12-06 DIAGNOSIS — O4700 False labor before 37 completed weeks of gestation, unspecified trimester: Secondary | ICD-10-CM

## 2018-12-06 DIAGNOSIS — O99213 Obesity complicating pregnancy, third trimester: Secondary | ICD-10-CM | POA: Insufficient documentation

## 2018-12-06 DIAGNOSIS — Z3689 Encounter for other specified antenatal screening: Secondary | ICD-10-CM | POA: Diagnosis not present

## 2018-12-06 DIAGNOSIS — J45909 Unspecified asthma, uncomplicated: Secondary | ICD-10-CM | POA: Insufficient documentation

## 2018-12-06 DIAGNOSIS — F411 Generalized anxiety disorder: Secondary | ICD-10-CM | POA: Insufficient documentation

## 2018-12-06 DIAGNOSIS — O368130 Decreased fetal movements, third trimester, not applicable or unspecified: Secondary | ICD-10-CM | POA: Diagnosis not present

## 2018-12-06 DIAGNOSIS — O99343 Other mental disorders complicating pregnancy, third trimester: Secondary | ICD-10-CM | POA: Insufficient documentation

## 2018-12-06 DIAGNOSIS — O99353 Diseases of the nervous system complicating pregnancy, third trimester: Secondary | ICD-10-CM | POA: Insufficient documentation

## 2018-12-06 DIAGNOSIS — F900 Attention-deficit hyperactivity disorder, predominantly inattentive type: Secondary | ICD-10-CM | POA: Diagnosis not present

## 2018-12-06 DIAGNOSIS — O133 Gestational [pregnancy-induced] hypertension without significant proteinuria, third trimester: Secondary | ICD-10-CM | POA: Insufficient documentation

## 2018-12-06 DIAGNOSIS — O99513 Diseases of the respiratory system complicating pregnancy, third trimester: Secondary | ICD-10-CM | POA: Diagnosis not present

## 2018-12-06 DIAGNOSIS — Z79899 Other long term (current) drug therapy: Secondary | ICD-10-CM | POA: Insufficient documentation

## 2018-12-06 DIAGNOSIS — Z3A35 35 weeks gestation of pregnancy: Secondary | ICD-10-CM | POA: Insufficient documentation

## 2018-12-06 DIAGNOSIS — O09293 Supervision of pregnancy with other poor reproductive or obstetric history, third trimester: Secondary | ICD-10-CM | POA: Diagnosis not present

## 2018-12-06 DIAGNOSIS — F319 Bipolar disorder, unspecified: Secondary | ICD-10-CM | POA: Diagnosis not present

## 2018-12-06 DIAGNOSIS — Z888 Allergy status to other drugs, medicaments and biological substances status: Secondary | ICD-10-CM | POA: Diagnosis not present

## 2018-12-06 DIAGNOSIS — O26893 Other specified pregnancy related conditions, third trimester: Secondary | ICD-10-CM | POA: Diagnosis not present

## 2018-12-06 DIAGNOSIS — Z818 Family history of other mental and behavioral disorders: Secondary | ICD-10-CM | POA: Insufficient documentation

## 2018-12-06 DIAGNOSIS — O479 False labor, unspecified: Secondary | ICD-10-CM | POA: Insufficient documentation

## 2018-12-06 DIAGNOSIS — Z3A36 36 weeks gestation of pregnancy: Secondary | ICD-10-CM

## 2018-12-06 DIAGNOSIS — G4733 Obstructive sleep apnea (adult) (pediatric): Secondary | ICD-10-CM | POA: Diagnosis not present

## 2018-12-06 DIAGNOSIS — O4703 False labor before 37 completed weeks of gestation, third trimester: Secondary | ICD-10-CM

## 2018-12-06 DIAGNOSIS — Z8751 Personal history of pre-term labor: Secondary | ICD-10-CM | POA: Diagnosis not present

## 2018-12-06 DIAGNOSIS — N939 Abnormal uterine and vaginal bleeding, unspecified: Secondary | ICD-10-CM | POA: Diagnosis not present

## 2018-12-06 HISTORY — DX: Gestational (pregnancy-induced) hypertension without significant proteinuria, unspecified trimester: O13.9

## 2018-12-06 LAB — URINALYSIS, ROUTINE W REFLEX MICROSCOPIC
Bilirubin Urine: NEGATIVE
Glucose, UA: NEGATIVE mg/dL
Hgb urine dipstick: NEGATIVE
Ketones, ur: NEGATIVE mg/dL
Nitrite: NEGATIVE
Protein, ur: 100 mg/dL — AB
Specific Gravity, Urine: 1.025 (ref 1.005–1.030)
Squamous Epithelial / HPF: >50 — ABNORMAL HIGH (ref 0–5)
WBC, UA: >50 WBC/hpf — ABNORMAL HIGH (ref 0–5)
pH: 6 (ref 5.0–8.0)

## 2018-12-06 LAB — OB RESULTS CONSOLE GBS: GBS: NEGATIVE

## 2018-12-06 MED ORDER — BETAMETHASONE SOD PHOS & ACET 6 (3-3) MG/ML IJ SUSP
12.0000 mg | INTRAMUSCULAR | Status: DC
  Administered 2018-12-06: 12 mg via INTRAMUSCULAR
  Filled 2018-12-06: qty 2

## 2018-12-06 NOTE — Discharge Instructions (Signed)
Vaginal Delivery    Vaginal delivery means that you give birth by pushing your baby out of your birth canal (vagina). A team of health care providers will help you before, during, and after vaginal delivery. Birth experiences are unique for every woman and every pregnancy, and birth experiences vary depending on where you choose to give birth.  What happens when I arrive at the birth center or hospital?  Once you are in labor and have been admitted into the hospital or birth center, your health care provider may:   Review your pregnancy history and any concerns that you have.   Insert an IV into one of your veins. This may be used to give you fluids and medicines.   Check your blood pressure, pulse, temperature, and heart rate (vital signs).   Check whether your bag of water (amniotic sac) has broken (ruptured).   Talk with you about your birth plan and discuss pain control options.  Monitoring  Your health care provider may monitor your contractions (uterine monitoring) and your baby's heart rate (fetal monitoring). You may need to be monitored:   Often, but not continuously (intermittently).   All the time or for long periods at a time (continuously). Continuous monitoring may be needed if:  ? You are taking certain medicines, such as medicine to relieve pain or make your contractions stronger.  ? You have pregnancy or labor complications.  Monitoring may be done by:   Placing a special stethoscope or a handheld monitoring device on your abdomen to check your baby's heartbeat and to check for contractions.   Placing monitors on your abdomen (external monitors) to record your baby's heartbeat and the frequency and length of contractions.   Placing monitors inside your uterus through your vagina (internal monitors) to record your baby's heartbeat and the frequency, length, and strength of your contractions. Depending on the type of monitor, it may remain in your uterus or on your baby's head until  birth.   Telemetry. This is a type of continuous monitoring that can be done with external or internal monitors. Instead of having to stay in bed, you are able to move around during telemetry.  Physical exam  Your health care provider may perform frequent physical exams. This may include:   Checking how and where your baby is positioned in your uterus.   Checking your cervix to determine:  ? Whether it is thinning out (effacing).  ? Whether it is opening up (dilating).  What happens during labor and delivery?    Normal labor and delivery is divided into the following three stages:  Stage 1   This is the longest stage of labor.   This stage can last for hours or days.   Throughout this stage, you will feel contractions. Contractions generally feel mild, infrequent, and irregular at first. They get stronger, more frequent (about every 2-3 minutes), and more regular as you move through this stage.   This stage ends when your cervix is completely dilated to 4 inches (10 cm) and completely effaced.  Stage 2   This stage starts once your cervix is completely effaced and dilated and lasts until the delivery of your baby.   This stage may last from 20 minutes to 2 hours.   This is the stage where you will feel an urge to push your baby out of your vagina.   You may feel stretching and burning pain, especially when the widest part of your baby's head passes through the   vaginal opening (crowning).   Once your baby is delivered, the umbilical cord will be clamped and cut. This usually occurs after waiting a period of 1-2 minutes after delivery.   Your baby will be placed on your bare chest (skin-to-skin contact) in an upright position and covered with a warm blanket. Watch your baby for feeding cues, like rooting or sucking, and help the baby to your breast for his or her first feeding.  Stage 3   This stage starts immediately after the birth of your baby and ends after you deliver the placenta.   This stage may  take anywhere from 5 to 30 minutes.   After your baby has been delivered, you will feel contractions as your body expels the placenta and your uterus contracts to control bleeding.  What can I expect after labor and delivery?   After labor is over, you and your baby will be monitored closely until you are ready to go home to ensure that you are both healthy. Your health care team will teach you how to care for yourself and your baby.   You and your baby will stay in the same room (rooming in) during your hospital stay. This will encourage early bonding and successful breastfeeding.   You may continue to receive fluids and medicines through an IV.   Your uterus will be checked and massaged regularly (fundal massage).   You will have some soreness and pain in your abdomen, vagina, and the area of skin between your vaginal opening and your anus (perineum).   If an incision was made near your vagina (episiotomy) or if you had some vaginal tearing during delivery, cold compresses may be placed on your episiotomy or your tear. This helps to reduce pain and swelling.   You may be given a squirt bottle to use instead of wiping when you go to the bathroom. To use the squirt bottle, follow these steps:  ? Before you urinate, fill the squirt bottle with warm water. Do not use hot water.  ? After you urinate, while you are sitting on the toilet, use the squirt bottle to rinse the area around your urethra and vaginal opening. This rinses away any urine and blood.  ? Fill the squirt bottle with clean water every time you use the bathroom.   It is normal to have vaginal bleeding after delivery. Wear a sanitary pad for vaginal bleeding and discharge.  Summary   Vaginal delivery means that you will give birth by pushing your baby out of your birth canal (vagina).   Your health care provider may monitor your contractions (uterine monitoring) and your baby's heart rate (fetal monitoring).   Your health care provider may  perform a physical exam.   Normal labor and delivery is divided into three stages.   After labor is over, you and your baby will be monitored closely until you are ready to go home.  This information is not intended to replace advice given to you by your health care provider. Make sure you discuss any questions you have with your health care provider.  Document Released: 09/09/2008 Document Revised: 01/05/2018 Document Reviewed: 01/05/2018  Elsevier Interactive Patient Education  2019 Elsevier Inc.

## 2018-12-06 NOTE — MAU Note (Signed)
Having painful contractions, feeling pressure.  Lost her mucous plug.
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
## 2018-12-07 ENCOUNTER — Other Ambulatory Visit: Payer: Self-pay

## 2018-12-07 ENCOUNTER — Inpatient Hospital Stay (HOSPITAL_COMMUNITY)
Admission: AD | Admit: 2018-12-07 | Discharge: 2018-12-07 | Disposition: A | Discharge status: Home / Self Care | Payer: Medicare Other | Source: Ambulatory Visit | Attending: Obstetrics and Gynecology | Admitting: Obstetrics and Gynecology

## 2018-12-07 ENCOUNTER — Encounter (HOSPITAL_COMMUNITY): Payer: Self-pay | Admitting: *Deleted

## 2018-12-07 DIAGNOSIS — Z3A35 35 weeks gestation of pregnancy: Secondary | ICD-10-CM | POA: Insufficient documentation

## 2018-12-07 DIAGNOSIS — O4693 Antepartum hemorrhage, unspecified, third trimester: Secondary | ICD-10-CM | POA: Insufficient documentation

## 2018-12-07 DIAGNOSIS — O26893 Other specified pregnancy related conditions, third trimester: Secondary | ICD-10-CM

## 2018-12-07 DIAGNOSIS — N939 Abnormal uterine and vaginal bleeding, unspecified: Secondary | ICD-10-CM

## 2018-12-07 LAB — CULTURE, OB URINE: Culture: NO GROWTH

## 2018-12-07 MED ORDER — BETAMETHASONE SOD PHOS & ACET 6 (3-3) MG/ML IJ SUSP
12.0000 mg | Freq: Once | INTRAMUSCULAR | Status: AC
  Administered 2018-12-07: 12 mg via INTRAMUSCULAR
  Filled 2018-12-07: qty 2

## 2018-12-07 NOTE — MAU Note (Signed)
Been contracting today, getting a little closer together, but thinks still irregular. Reddish/brown mucous noted today.  Here for 2nd dose of bethamethasone

## 2018-12-07 NOTE — MAU Note (Signed)
Pt states she is not ready for BMZ injection @ this time.  Explained to pt importance of giving injections 24 hours apart, states she will let me know when she is ready.

## 2018-12-07 NOTE — Discharge Instructions (Signed)
Hypertension During Pregnancy ° °Hypertension is also called high blood pressure. High blood pressure means that the force of your blood moving in your body is too strong. When you are pregnant, this condition should be watched carefully. It can cause problems for you and your baby. °Follow these instructions at home: °Eating and drinking ° °· Drink enough fluid to keep your pee (urine) pale yellow. °· Avoid caffeine. °Lifestyle °· Do not use any products that contain nicotine or tobacco, such as cigarettes and e-cigarettes. If you need help quitting, ask your doctor. °· Do not use alcohol or drugs. °· Avoid stress. °· Rest and get plenty of sleep. °General instructions °· Take over-the-counter and prescription medicines only as told by your doctor. °· While lying down, lie on your left side. This keeps pressure off your major blood vessels. °· While sitting or lying down, raise (elevate) your feet. Try putting some pillows under your lower legs. °· Exercise regularly. Ask your doctor what kinds of exercise are best for you. °· Keep all prenatal and follow-up visits as told by your doctor. This is important. °Contact a doctor if: °· You have symptoms that your doctor told you to watch for, such as: °? Throwing up (vomiting). °? Feeling sick to your stomach (nausea). °? Headache. °Get help right away if you have: °· Very bad belly pain that does not get better with treatment. °· A very bad headache that does not get better. °· Throwing up that does not get better with treatment. °· Sudden, fast weight gain. °· Sudden swelling in your hands, ankles, or face. °· Bleeding from your vagina. °· Blood in your pee. °· Fewer movements from your baby than usual. °· Blurry vision. °· Double vision. °· Muscle twitching. °· Sudden muscle tightening (spasms). °· Trouble breathing. °· Blue fingernails or lips. °Summary °· Hypertension is also called high blood pressure. High blood pressure means that the force of your blood moving  in your body is too strong. °· When you are pregnant, this condition should be watched carefully. It can cause problems for you and your baby. °· Get help right away if you have symptoms that your doctor told you to watch for. °This information is not intended to replace advice given to you by your health care provider. Make sure you discuss any questions you have with your health care provider. °Document Released: 01/03/2011 Document Revised: 11/17/2017 Document Reviewed: 08/12/2016 °Elsevier Interactive Patient Education © 2019 Elsevier Inc. ° °

## 2018-12-07 NOTE — MAU Provider Note (Signed)
Patient [name] is a 32 y.o. [phone] At [redacted]w[redacted]d here with complaints of vaginal bleeding and contractions for a "a couple days". She started having bloody show last night; she was examined in MAU yesterday. Today she had a little bit of spotting today and she lost her mucous plug; it was bloody.   She denies LOF, HA, NV, blurry vision. She endorses vaginal bleeding; denies decreased fetal movements.  History     CSN: [id number]  Arrival date and time: 12/07/18 1630   First Provider Initiated Contact with Patient 12/07/18 1752      Chief Complaint  Patient presents with  . Vaginal Bleeding  . Contractions   Abdominal Pain  This is a new problem. The current episode started yesterday. The problem occurs constantly. The problem has been unchanged. The pain is located in the generalized abdominal region. The pain is at a severity of 5/10. The abdominal pain does not radiate. Pertinent negatives include no diarrhea, dysuria, nausea or vomiting.  Vaginal Bleeding  The patient's primary symptoms include vaginal bleeding. This is a new problem. The current episode started in the past 7 days. Associated symptoms include abdominal pain. Pertinent negatives include no diarrhea, dysuria, nausea or vomiting.    OB History    Gravida  4   Para  3   Term  2   Preterm  1   AB  0   Living  3     SAB  0   TAB      Ectopic      Multiple  0   Live Births  3           Past Medical History:  Diagnosis Date  . Anxiety   . Asthma    inhaler PRN-hasn't used inhaler in months  . Attention deficit hyperactivity disorder   . Bipolar disorder [facility])    hospitalized as a teen for suicidal ideations and cutting  . Depression   . Gastritis   . GERD (gastroesophageal reflux disease)   . HPV (human papilloma virus) anogenital infection   . Hx of varicella   . Mild preeclampsia 02/09/2016  . Pregnancy induced hypertension   . Vaginal Pap smear, abnormal     Past Surgical  History:  Procedure Laterality Date  . EYE MUSCLE SURGERY Bilateral    07/29/12  . MOUTH SURGERY    . PLANTAR FASCIA SURGERY    . WISDOM TOOTH EXTRACTION      Family History  Problem Relation Age of Onset  . Depression Mother   . Colon cancer Mother   . Colon polyps Mother   . Depression Father   . Depression Brother   . Liver disease Other   . Kidney disease Other     Social History   Tobacco Use  . Smoking status: Never Smoker  . Smokeless tobacco: Never Used  Substance Use Topics  . Alcohol use: No    Alcohol/week: 0.0 standard drinks  . Drug use: No    Allergies:  Allergies  Allergen Reactions  . Depakote [Divalproex Sodium] Other (See Comments)    Pt states that this medication makes her blood levels toxic.    [name] Methylphenidate Derivatives Other (See Comments)    Reaction:  Depression and anger   . Neurontin [Gabapentin] Other (See Comments)    Reaction:  Dizziness   . Prozac [Fluoxetine Hcl] Other (See Comments)    Reaction:  Anger     Medications Prior to Admission  Medication Sig  Dispense Refill Last Dose  . ARIPiprazole (ABILIFY) 15 MG tablet Take 1 tablet (15 mg total) by mouth daily. 30 tablet 2 Taking  . busPIRone (BUSPAR) 10 MG tablet Take 1 tablet (10 mg total) by mouth 2 (two) times daily. 60 tablet 2 Taking  . butalbital-acetaminophen-caffeine (FIORICET, ESGIC) 50-325-40 MG tablet Take 1 tablet by mouth every 6 (six) hours as needed for headache. (Patient not taking: Reported on 11/25/2018) 20 tablet 0 Not Taking  . Prenatal Vit-Fe Fumarate-FA (PRENATAL MULTIVITAMIN) TABS tablet Take 1 tablet by mouth at bedtime. 30 tablet 11 Taking    Review of Systems  Constitutional: Negative.   HENT: Negative.   Respiratory: Negative.   Gastrointestinal: Positive for abdominal pain. Negative for diarrhea, nausea and vomiting.  Genitourinary: Positive for vaginal bleeding. Negative for dysuria.  Musculoskeletal: Negative.   Neurological: Negative.    Hematological: Negative.   Psychiatric/Behavioral: Negative.    Physical Exam   Blood pressure 121/84, pulse 96, temperature 97.9 F (36.6 C), temperature source Oral, resp. rate 18, weight 85.5 kg, last menstrual period 04/02/2018, SpO2 100 %, unknown if currently breastfeeding.  Physical Exam  Constitutional: She appears well-developed.  HENT:  Head: Normocephalic.  Neck: Normal range of motion.  GI: Soft. She exhibits no distension. There is no abdominal tenderness. There is no rebound.  Genitourinary:    Genitourinary Comments: NEFG; no lesions on vaginal walls, thick mucous discharge in the vagina; no pooling. Cervix is 2 cm, long, posterior. No active bleeding.    Musculoskeletal: Normal range of motion.  Neurological: She is alert.    MAU Course  Procedures  MDM -NST: 120, pos acel, neg decels, mod variable, irregular contractions. Appropriate for 35 weeks. At times baseline drops to 110 when patient's head of bed was lowered; however, patient felt strong fetal movements throughout entirety of MAU visit.   -BP normal in MAU; no s/s of pre-eclampsia.  -Will not repeat cultures today.  -Reviewed BPP from yesterday; was 8/8.  -Importance of getting her BMZ shot today (patient did not want her shot) and that we cannot guarantee that she will have a fast labor. Patient finally agreed to [place] shot.  -Patient has not had any bleeding while in MAU. Explained to patient that her spotting could be from cervical checks yesterday, and that she may have more bleeding tomorrow and tonight due to frequent exams here in MAU.  Assessment and Plan   1. Vaginal bleeding    2. Patient stable for discharge; reviewed BPP from yesterday, which was 8/8. Patient states that she can't make her [place] on Monday and thinks she can make it on Tuesday, 12/31. She has an appt on 12/31 at 10 am; will send message to [place] to see if patient can have BPP at 11:15 or later on 12/31. She has induction scheduled for  12/17/2018.    3. Extensive return precautions given, as well as detailed education on warning signs of labor, what to expect with an induction.   Mervyn Skeeters Kyndall Chaplin 12/07/2018, 5:52 PM

## 2018-12-09 ENCOUNTER — Other Ambulatory Visit: Payer: Self-pay

## 2018-12-10 ENCOUNTER — Other Ambulatory Visit (HOSPITAL_COMMUNITY): Payer: Self-pay | Admitting: *Deleted

## 2018-12-10 DIAGNOSIS — O139 Gestational [pregnancy-induced] hypertension without significant proteinuria, unspecified trimester: Secondary | ICD-10-CM

## 2018-12-12 ENCOUNTER — Other Ambulatory Visit: Payer: Self-pay

## 2018-12-12 ENCOUNTER — Inpatient Hospital Stay (HOSPITAL_COMMUNITY)
Admission: AD | Admit: 2018-12-12 | Discharge: 2018-12-12 | Disposition: A | Discharge status: Home / Self Care | Payer: Medicare Other | Source: Ambulatory Visit | Attending: Obstetrics and Gynecology | Admitting: Obstetrics and Gynecology

## 2018-12-12 ENCOUNTER — Encounter (HOSPITAL_COMMUNITY): Payer: Self-pay

## 2018-12-12 DIAGNOSIS — Z3A36 36 weeks gestation of pregnancy: Secondary | ICD-10-CM

## 2018-12-12 DIAGNOSIS — O479 False labor, unspecified: Secondary | ICD-10-CM

## 2018-12-12 DIAGNOSIS — O4703 False labor before 37 completed weeks of gestation, third trimester: Secondary | ICD-10-CM

## 2018-12-12 DIAGNOSIS — O368130 Decreased fetal movements, third trimester, not applicable or unspecified: Secondary | ICD-10-CM | POA: Diagnosis present

## 2018-12-12 DIAGNOSIS — R52 Pain, unspecified: Secondary | ICD-10-CM | POA: Diagnosis not present

## 2018-12-12 LAB — URINALYSIS, ROUTINE W REFLEX MICROSCOPIC
Bilirubin Urine: NEGATIVE
Glucose, UA: NEGATIVE mg/dL
Hgb urine dipstick: NEGATIVE
Ketones, ur: NEGATIVE mg/dL
Nitrite: NEGATIVE
Protein, ur: 30 mg/dL — AB
Specific Gravity, Urine: 1.015 (ref 1.005–1.030)
pH: 7 (ref 5.0–8.0)

## 2018-12-12 NOTE — MAU Note (Signed)
Pt arrived via ems complaints of ctx for past 3 days 5 min apart. Pt denies LOF or vag bleeding.

## 2018-12-12 NOTE — Discharge Instructions (Signed)
Braxton Hicks Contractions Contractions of the uterus can occur throughout pregnancy, but they are not always a sign that you are in labor. You may have practice contractions called Braxton Hicks contractions. These false labor contractions are sometimes confused with true labor. What are Braxton Hicks contractions? Braxton Hicks contractions are tightening movements that occur in the muscles of the uterus before labor. Unlike true labor contractions, these contractions do not result in opening (dilation) and thinning of the cervix. Toward the end of pregnancy (32-34 weeks), Braxton Hicks contractions can happen more often and may become stronger. These contractions are sometimes difficult to tell apart from true labor because they can be very uncomfortable. You should not feel embarrassed if you go to the hospital with false labor. Sometimes, the only way to tell if you are in true labor is for your health care provider to look for changes in the cervix. The health care provider will do a physical exam and may monitor your contractions. If you are not in true labor, the exam should show that your cervix is not dilating and your water has not broken. If there are no other health problems associated with your pregnancy, it is completely safe for you to be sent home with false labor. You may continue to have Braxton Hicks contractions until you go into true labor. How to tell the difference between true labor and false labor True labor  Contractions last 30-70 seconds.  Contractions become very regular.  Discomfort is usually felt in the top of the uterus, and it spreads to the lower abdomen and low back.  Contractions do not go away with walking.  Contractions usually become more intense and increase in frequency.  The cervix dilates and gets thinner. False labor  Contractions are usually shorter and not as strong as true labor contractions.  Contractions are usually irregular.  Contractions  are often felt in the front of the lower abdomen and in the groin.  Contractions may go away when you walk around or change positions while lying down.  Contractions get weaker and are shorter-lasting as time goes on.  The cervix usually does not dilate or become thin. Follow these instructions at home:   Take over-the-counter and prescription medicines only as told by your health care provider.  Keep up with your usual exercises and follow other instructions from your health care provider.  Eat and drink lightly if you think you are going into labor.  If Braxton Hicks contractions are making you uncomfortable: ? Change your position from lying down or resting to walking, or change from walking to resting. ? Sit and rest in a tub of warm water. ? Drink enough fluid to keep your urine pale yellow. Dehydration may cause these contractions. ? Do slow and deep breathing several times an hour.  Keep all follow-up prenatal visits as told by your health care provider. This is important. Contact a health care provider if:  You have a fever.  You have continuous pain in your abdomen. Get help right away if:  Your contractions become stronger, more regular, and closer together.  You have fluid leaking or gushing from your vagina.  You pass blood-tinged mucus (bloody show).  You have bleeding from your vagina.  You have low back pain that you never had before.  You feel your baby's head pushing down and causing pelvic pressure.  Your baby is not moving inside you as much as it used to. Summary  Contractions that occur before labor are   called Braxton Hicks contractions, false labor, or practice contractions.  Braxton Hicks contractions are usually shorter, weaker, farther apart, and less regular than true labor contractions. True labor contractions usually become progressively stronger and regular, and they become more frequent.  Manage discomfort from Braxton Hicks contractions  by changing position, resting in a warm bath, drinking plenty of water, or practicing deep breathing. This information is not intended to replace advice given to you by your health care provider. Make sure you discuss any questions you have with your health care provider. Document Released: 04/16/2017 Document Revised: 09/15/2017 Document Reviewed: 04/16/2017 Elsevier Interactive Patient Education  2019 Elsevier Inc. Fetal Movement Counts Patient Name: ________________________________________________ Patient Due Date: ____________________ What is a fetal movement count?  A fetal movement count is the number of times that you feel your baby move during a certain amount of time. This may also be called a fetal kick count. A fetal movement count is recommended for every pregnant woman. You may be asked to start counting fetal movements as early as week 28 of your pregnancy. Pay attention to when your baby is most active. You may notice your baby's sleep and wake cycles. You may also notice things that make your baby move more. You should do a fetal movement count:  When your baby is normally most active.  At the same time each day. A good time to count movements is while you are resting, after having something to eat and drink. How do I count fetal movements? 1. Find a quiet, comfortable area. Sit, or lie down on your side. 2. Write down the date, the start time and stop time, and the number of movements that you felt between those two times. Take this information with you to your health care visits. 3. For 2 hours, count kicks, flutters, swishes, rolls, and jabs. You should feel at least 10 movements during 2 hours. 4. You may stop counting after you have felt 10 movements. 5. If you do not feel 10 movements in 2 hours, have something to eat and drink. Then, keep resting and counting for 1 hour. If you feel at least 4 movements during that hour, you may stop counting. Contact a health care  provider if:  You feel fewer than 4 movements in 2 hours.  Your baby is not moving like he or she usually does. Date: ____________ Start time: ____________ Stop time: ____________ Movements: ____________ Date: ____________ Start time: ____________ Stop time: ____________ Movements: ____________ Date: ____________ Start time: ____________ Stop time: ____________ Movements: ____________ Date: ____________ Start time: ____________ Stop time: ____________ Movements: ____________ Date: ____________ Start time: ____________ Stop time: ____________ Movements: ____________ Date: ____________ Start time: ____________ Stop time: ____________ Movements: ____________ Date: ____________ Start time: ____________ Stop time: ____________ Movements: ____________ Date: ____________ Start time: ____________ Stop time: ____________ Movements: ____________ Date: ____________ Start time: ____________ Stop time: ____________ Movements: ____________ This information is not intended to replace advice given to you by your health care provider. Make sure you discuss any questions you have with your health care provider. Document Released: 12/31/2006 Document Revised: 07/30/2016 Document Reviewed: 01/10/2016 Elsevier Interactive Patient Education  2019 Elsevier Inc.  
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
## 2018-12-13 ENCOUNTER — Encounter (HOSPITAL_COMMUNITY): Payer: Self-pay

## 2018-12-13 ENCOUNTER — Ambulatory Visit (HOSPITAL_COMMUNITY): Payer: Medicare Other

## 2018-12-13 ENCOUNTER — Telehealth (HOSPITAL_COMMUNITY): Payer: Self-pay | Admitting: *Deleted

## 2018-12-13 ENCOUNTER — Encounter (HOSPITAL_COMMUNITY): Payer: Self-pay | Admitting: *Deleted

## 2018-12-13 NOTE — Telephone Encounter (Signed)
Preadmission screen  

## 2018-12-14 ENCOUNTER — Inpatient Hospital Stay (HOSPITAL_COMMUNITY)
Admission: AD | Admit: 2018-12-14 | Discharge: 2018-12-14 | Disposition: A | Discharge status: Home / Self Care | Payer: Medicare Other | Attending: Obstetrics & Gynecology | Admitting: Obstetrics & Gynecology

## 2018-12-14 ENCOUNTER — Encounter: Payer: Medicare Other | Admitting: Internal Medicine

## 2018-12-14 ENCOUNTER — Encounter (HOSPITAL_COMMUNITY): Payer: Self-pay

## 2018-12-14 ENCOUNTER — Encounter (HOSPITAL_COMMUNITY): Payer: Self-pay | Admitting: *Deleted

## 2018-12-14 ENCOUNTER — Other Ambulatory Visit: Payer: Self-pay

## 2018-12-14 ENCOUNTER — Other Ambulatory Visit: Payer: Self-pay | Admitting: Advanced Practice Midwife

## 2018-12-14 ENCOUNTER — Ambulatory Visit (HOSPITAL_COMMUNITY): Admission: RE | Admit: 2018-12-14 | Payer: Medicare Other | Source: Ambulatory Visit

## 2018-12-14 DIAGNOSIS — I10 Essential (primary) hypertension: Secondary | ICD-10-CM | POA: Diagnosis not present

## 2018-12-14 DIAGNOSIS — O4703 False labor before 37 completed weeks of gestation, third trimester: Secondary | ICD-10-CM | POA: Insufficient documentation

## 2018-12-14 DIAGNOSIS — O479 False labor, unspecified: Secondary | ICD-10-CM

## 2018-12-14 DIAGNOSIS — Z3A36 36 weeks gestation of pregnancy: Secondary | ICD-10-CM | POA: Diagnosis not present

## 2018-12-14 DIAGNOSIS — R0902 Hypoxemia: Secondary | ICD-10-CM | POA: Diagnosis not present

## 2018-12-14 DIAGNOSIS — R52 Pain, unspecified: Secondary | ICD-10-CM | POA: Diagnosis not present

## 2018-12-14 LAB — URINALYSIS, ROUTINE W REFLEX MICROSCOPIC
Bilirubin Urine: NEGATIVE
Glucose, UA: NEGATIVE mg/dL
Hgb urine dipstick: NEGATIVE
Ketones, ur: 5 mg/dL — AB
Nitrite: NEGATIVE
Protein, ur: 100 mg/dL — AB
Specific Gravity, Urine: 1.029 (ref 1.005–1.030)
WBC, UA: >50 WBC/hpf — ABNORMAL HIGH (ref 0–5)
pH: 5 (ref 5.0–8.0)

## 2018-12-14 NOTE — MAU Note (Signed)
States contractions feel stronger, they are "moderate".  True vs false labor discussed, contractions without change is false labor

## 2018-12-14 NOTE — MAU Note (Signed)
Has continued to contract since last here. Not closer or stronger. Denies bleeding or leaking.

## 2018-12-14 NOTE — Progress Notes (Signed)
I have communicated with Tomi Bamberger PA and reviewed vital signs:  Vitals:   12/14/18 1703 12/14/18 1742  BP: 124/89 121/86  Pulse: (!) 102 94  Resp: 14 16  Temp: 98.4 F (36.9 C)   SpO2: 98%     Vaginal exam:  Dilation: 3 Effacement (%): 50 Cervical Position: Posterior Station: -2 Exam by:: Acquanetta Cabanilla,   Also reviewed contraction pattern and that non-stress test is reactive.  It has been documented that patient is having irregular contractions.  with no cervical change since she was examined yesterday, not indicating active labor.  Patient denies any other complaints.  Based on this report provider has given order for discharge.  A discharge order and diagnosis entered by a provider.   Labor discharge instructions reviewed with patient.

## 2018-12-14 NOTE — MAU Provider Note (Signed)
S: [name] is a 32 y.o. [phone] at [redacted]w[redacted]d  who presents to MAU today for labor evaluation.     Cervical exam by RN:  Dilation: 3 Effacement (%): 50 Cervical Position: Posterior Station: -2 Exam by:: jolynn  Fetal Monitoring: Baseline: 120 bpm Variability: moderate Accelerations: 15 x 15 Decelerations: none Contractions: irregular   MDM Discussed patient with RN. NST reviewed.   A: SIUP at [redacted]w[redacted]d  False labor  P: Discharge home Labor precautions and kick counts included in AVS Patient to follow-up with [facility] as scheduled  Patient may return to MAU as needed or when in labor   Luvenia Redden, [place] 12/14/2018 5:48 PM

## 2018-12-14 NOTE — Discharge Instructions (Signed)
Braxton Hicks Contractions Contractions of the uterus can occur throughout pregnancy, but they are not always a sign that you are in labor. You may have practice contractions called Braxton Hicks contractions. These false labor contractions are sometimes confused with true labor. What are Braxton Hicks contractions? Braxton Hicks contractions are tightening movements that occur in the muscles of the uterus before labor. Unlike true labor contractions, these contractions do not result in opening (dilation) and thinning of the cervix. Toward the end of pregnancy (32-34 weeks), Braxton Hicks contractions can happen more often and may become stronger. These contractions are sometimes difficult to tell apart from true labor because they can be very uncomfortable. You should not feel embarrassed if you go to the hospital with false labor. Sometimes, the only way to tell if you are in true labor is for your health care provider to look for changes in the cervix. The health care provider will do a physical exam and may monitor your contractions. If you are not in true labor, the exam should show that your cervix is not dilating and your water has not broken. If there are no other health problems associated with your pregnancy, it is completely safe for you to be sent home with false labor. You may continue to have Braxton Hicks contractions until you go into true labor. How to tell the difference between true labor and false labor True labor  Contractions last 30-70 seconds.  Contractions become very regular.  Discomfort is usually felt in the top of the uterus, and it spreads to the lower abdomen and low back.  Contractions do not go away with walking.  Contractions usually become more intense and increase in frequency.  The cervix dilates and gets thinner. False labor  Contractions are usually shorter and not as strong as true labor contractions.  Contractions are usually irregular.  Contractions  are often felt in the front of the lower abdomen and in the groin.  Contractions may go away when you walk around or change positions while lying down.  Contractions get weaker and are shorter-lasting as time goes on.  The cervix usually does not dilate or become thin. Follow these instructions at home:   Take over-the-counter and prescription medicines only as told by your health care provider.  Keep up with your usual exercises and follow other instructions from your health care provider.  Eat and drink lightly if you think you are going into labor.  If Braxton Hicks contractions are making you uncomfortable: ? Change your position from lying down or resting to walking, or change from walking to resting. ? Sit and rest in a tub of warm water. ? Drink enough fluid to keep your urine pale yellow. Dehydration may cause these contractions. ? Do slow and deep breathing several times an hour.  Keep all follow-up prenatal visits as told by your health care provider. This is important. Contact a health care provider if:  You have a fever.  You have continuous pain in your abdomen. Get help right away if:  Your contractions become stronger, more regular, and closer together.  You have fluid leaking or gushing from your vagina.  You pass blood-tinged mucus (bloody show).  You have bleeding from your vagina.  You have low back pain that you never had before.  You feel your baby's head pushing down and causing pelvic pressure.  Your baby is not moving inside you as much as it used to. Summary  Contractions that occur before labor are   called Braxton Hicks contractions, false labor, or practice contractions.  Braxton Hicks contractions are usually shorter, weaker, farther apart, and less regular than true labor contractions. True labor contractions usually become progressively stronger and regular, and they become more frequent.  Manage discomfort from Braxton Hicks contractions  by changing position, resting in a warm bath, drinking plenty of water, or practicing deep breathing. This information is not intended to replace advice given to you by your health care provider. Make sure you discuss any questions you have with your health care provider. Document Released: 04/16/2017 Document Revised: 09/15/2017 Document Reviewed: 04/16/2017 Elsevier Interactive Patient Education  2019 Elsevier Inc.  

## 2018-12-15 ENCOUNTER — Inpatient Hospital Stay (HOSPITAL_COMMUNITY)
Admission: AD | Admit: 2018-12-15 | Discharge: 2018-12-15 | Disposition: A | Discharge status: Home / Self Care | Payer: Medicare Other | Attending: Obstetrics and Gynecology | Admitting: Obstetrics and Gynecology

## 2018-12-15 ENCOUNTER — Encounter (HOSPITAL_COMMUNITY): Payer: Self-pay | Admitting: Emergency Medicine

## 2018-12-15 DIAGNOSIS — O4703 False labor before 37 completed weeks of gestation, third trimester: Secondary | ICD-10-CM

## 2018-12-15 DIAGNOSIS — Z3A36 36 weeks gestation of pregnancy: Secondary | ICD-10-CM

## 2018-12-15 DIAGNOSIS — O479 False labor, unspecified: Secondary | ICD-10-CM

## 2018-12-15 NOTE — MAU Note (Signed)
Pt arrives to MAU with co ctx every 1.5-4 mins. CO lost mucus plug that is blood tinged. Pt also co decreased fetal movement. Denies LOF.

## 2018-12-15 NOTE — MAU Provider Note (Signed)
S: [name] is a 33 y.o. [phone] at 101w5d  who presents to MAU today complaining contractions q 5 minutes since several days. She denies vaginal bleeding. She denies LOF. She reports normal fetal movement.    O: BP 125/82 (BP Location: Left Arm)   Pulse 89   Temp 98.1 F (36.7 C) (Oral)   Resp 17   LMP 04/02/2018   SpO2 94%  GENERAL: Well-developed, well-nourished female in no acute distress.  HEAD: Normocephalic, atraumatic.  CHEST: Normal effort of breathing, regular heart rate ABDOMEN: Soft, nontender, gravid  Cervical exam:  Dilation: 3 Presentation: Vertex Exam by:: Angelina Pih, RNC   Fetal Monitoring: Baseline: 130 Variability: moderate Accelerations: 15x15 Decelerations: none Contractions: 5-8   A: SIUP at [redacted]w[redacted]d  False labor  P: Discharge home Labor precautions reviewed Patient to return to [facility] for [facility] care Patient may return to MAU as needed if condition changes  Wende Mott, [place] 12/15/2018 7:36 PM

## 2018-12-15 NOTE — Discharge Instructions (Signed)
Braxton Hicks Contractions Contractions of the uterus can occur throughout pregnancy, but they are not always a sign that you are in labor. You may have practice contractions called Braxton Hicks contractions. These false labor contractions are sometimes confused with true labor. What are Braxton Hicks contractions? Braxton Hicks contractions are tightening movements that occur in the muscles of the uterus before labor. Unlike true labor contractions, these contractions do not result in opening (dilation) and thinning of the cervix. Toward the end of pregnancy (32-34 weeks), Braxton Hicks contractions can happen more often and may become stronger. These contractions are sometimes difficult to tell apart from true labor because they can be very uncomfortable. You should not feel embarrassed if you go to the hospital with false labor. Sometimes, the only way to tell if you are in true labor is for your health care provider to look for changes in the cervix. The health care provider will do a physical exam and may monitor your contractions. If you are not in true labor, the exam should show that your cervix is not dilating and your water has not broken. If there are no other health problems associated with your pregnancy, it is completely safe for you to be sent home with false labor. You may continue to have Braxton Hicks contractions until you go into true labor. How to tell the difference between true labor and false labor True labor  Contractions last 30-70 seconds.  Contractions become very regular.  Discomfort is usually felt in the top of the uterus, and it spreads to the lower abdomen and low back.  Contractions do not go away with walking.  Contractions usually become more intense and increase in frequency.  The cervix dilates and gets thinner. False labor  Contractions are usually shorter and not as strong as true labor contractions.  Contractions are usually irregular.  Contractions  are often felt in the front of the lower abdomen and in the groin.  Contractions may go away when you walk around or change positions while lying down.  Contractions get weaker and are shorter-lasting as time goes on.  The cervix usually does not dilate or become thin. Follow these instructions at home:   Take over-the-counter and prescription medicines only as told by your health care provider.  Keep up with your usual exercises and follow other instructions from your health care provider.  Eat and drink lightly if you think you are going into labor.  If Braxton Hicks contractions are making you uncomfortable: ? Change your position from lying down or resting to walking, or change from walking to resting. ? Sit and rest in a tub of warm water. ? Drink enough fluid to keep your urine pale yellow. Dehydration may cause these contractions. ? Do slow and deep breathing several times an hour.  Keep all follow-up prenatal visits as told by your health care provider. This is important. Contact a health care provider if:  You have a fever.  You have continuous pain in your abdomen. Get help right away if:  Your contractions become stronger, more regular, and closer together.  You have fluid leaking or gushing from your vagina.  You pass blood-tinged mucus (bloody show).  You have bleeding from your vagina.  You have low back pain that you never had before.  You feel your baby's head pushing down and causing pelvic pressure.  Your baby is not moving inside you as much as it used to. Summary  Contractions that occur before labor are   called Braxton Hicks contractions, false labor, or practice contractions.  Braxton Hicks contractions are usually shorter, weaker, farther apart, and less regular than true labor contractions. True labor contractions usually become progressively stronger and regular, and they become more frequent.  Manage discomfort from Braxton Hicks contractions  by changing position, resting in a warm bath, drinking plenty of water, or practicing deep breathing. This information is not intended to replace advice given to you by your health care provider. Make sure you discuss any questions you have with your health care provider. Document Released: 04/16/2017 Document Revised: 09/15/2017 Document Reviewed: 04/16/2017 Elsevier Interactive Patient Education  2019 Elsevier Inc.  

## 2018-12-15 NOTE — L&D Delivery Note (Addendum)
Delivery Note Patient is a 33 y.o. now G4P2103s/p NSVD at [redacted]w[redacted]d, who was admitted for SOL.  She progressed with AROM to complete and pushed 81min (4x) to deliver. At 12:23 PM a viable female was delivered via Vaginal, Spontaneous (Presentation: cephalic, LOP).  APGAR: , 9; weight pending.  Cord clamping delayed by several minutes then clamped by CNM and cut by [name].  Placenta intact and spontaneous, bleeding minimal. 3-vessel cord intact without complications. Cord pH: N/a No laceration repair required.  Mom and baby stable prior to transfer to postpartum. She plans on breastfeeding. She requests Depo-Provera at discharge and an interval BTL for birth control.  Anesthesia:  Epidural Episiotomy: None Lacerations: None Suture Repair: None Est. Blood Loss (mL): 126  Mom to postpartum.  Baby to Couplet care / Skin to Skin.  Gabriel Carina, SNM 12/17/2018, 1:09 PM  Midwife attestation: I was gloved and present for delivery in its entirety and I agree with the above student's note.  Fatima Blank, CNM 9:47 AM

## 2018-12-17 ENCOUNTER — Other Ambulatory Visit: Payer: Self-pay

## 2018-12-17 ENCOUNTER — Encounter (HOSPITAL_COMMUNITY): Payer: Self-pay

## 2018-12-17 ENCOUNTER — Inpatient Hospital Stay (HOSPITAL_COMMUNITY)
Admission: AD | Admit: 2018-12-17 | Discharge: 2018-12-19 | DRG: 807 | Disposition: A | Discharge status: Home / Self Care | Payer: Medicare Other | Attending: Obstetrics and Gynecology | Admitting: Obstetrics and Gynecology

## 2018-12-17 ENCOUNTER — Inpatient Hospital Stay (HOSPITAL_COMMUNITY): Payer: Medicare Other | Admitting: Anesthesiology

## 2018-12-17 ENCOUNTER — Inpatient Hospital Stay (HOSPITAL_COMMUNITY): Admit: 2018-12-17 | Payer: Medicare Other

## 2018-12-17 DIAGNOSIS — O99344 Other mental disorders complicating childbirth: Secondary | ICD-10-CM | POA: Diagnosis present

## 2018-12-17 DIAGNOSIS — Z3A37 37 weeks gestation of pregnancy: Secondary | ICD-10-CM

## 2018-12-17 DIAGNOSIS — Z3483 Encounter for supervision of other normal pregnancy, third trimester: Secondary | ICD-10-CM | POA: Diagnosis present

## 2018-12-17 DIAGNOSIS — F319 Bipolar disorder, unspecified: Secondary | ICD-10-CM | POA: Diagnosis present

## 2018-12-17 DIAGNOSIS — O139 Gestational [pregnancy-induced] hypertension without significant proteinuria, unspecified trimester: Secondary | ICD-10-CM | POA: Diagnosis present

## 2018-12-17 DIAGNOSIS — O134 Gestational [pregnancy-induced] hypertension without significant proteinuria, complicating childbirth: Secondary | ICD-10-CM | POA: Diagnosis present

## 2018-12-17 LAB — CBC
HCT: 33.8 % — ABNORMAL LOW (ref 36.0–46.0)
HCT: 34.4 % — ABNORMAL LOW (ref 36.0–46.0)
Hemoglobin: 10.7 g/dL — ABNORMAL LOW (ref 12.0–15.0)
Hemoglobin: 10.7 g/dL — ABNORMAL LOW (ref 12.0–15.0)
MCH: 29.5 pg (ref 26.0–34.0)
MCH: 29.3 pg (ref 26.0–34.0)
MCHC: 31.7 g/dL (ref 30.0–36.0)
MCHC: 31.1 g/dL (ref 30.0–36.0)
MCV: 93.1 fL (ref 80.0–100.0)
MCV: 94.2 fL (ref 80.0–100.0)
Platelets: 162 10*3/uL (ref 150–400)
Platelets: 138 10*3/uL — ABNORMAL LOW (ref 150–400)
RBC: 3.63 MIL/uL — ABNORMAL LOW (ref 3.87–5.11)
RBC: 3.65 MIL/uL — ABNORMAL LOW (ref 3.87–5.11)
RDW: 14.5 % (ref 11.5–15.5)
RDW: 14.5 % (ref 11.5–15.5)
WBC: 10.8 10*3/uL — ABNORMAL HIGH (ref 4.0–10.5)
WBC: 11.7 10*3/uL — ABNORMAL HIGH (ref 4.0–10.5)
nRBC: 0 % (ref 0.0–0.2)
nRBC: 0 % (ref 0.0–0.2)

## 2018-12-17 LAB — RPR: RPR Ser Ql: NONREACTIVE

## 2018-12-17 LAB — TYPE AND SCREEN
ABO/RH(D): O POS
Antibody Screen: NEGATIVE

## 2018-12-17 MED ORDER — LIDOCAINE HCL (PF) 1 % IJ SOLN
30.0000 mL | INTRAMUSCULAR | Status: DC | PRN
  Filled 2018-12-17: qty 30

## 2018-12-17 MED ORDER — PRENATAL MULTIVITAMIN CH
1.0000 | ORAL_TABLET | Freq: Every day | ORAL | Status: DC
  Filled 2018-12-17: qty 1

## 2018-12-17 MED ORDER — SOD CITRATE-CITRIC ACID 500-334 MG/5ML PO SOLN: 30.0000 mL | ORAL | Status: DC | PRN

## 2018-12-17 MED ORDER — TERBUTALINE SULFATE 1 MG/ML IJ SOLN
0.2500 mg | Freq: Once | INTRAMUSCULAR | Status: DC | PRN
  Filled 2018-12-17: qty 1

## 2018-12-17 MED ORDER — OXYTOCIN BOLUS FROM INFUSION
500.0000 mL | Freq: Once | INTRAVENOUS | Status: AC
  Administered 2018-12-17: 500 mL via INTRAVENOUS

## 2018-12-17 MED ORDER — MORPHINE SULFATE (PF) 10 MG/ML IV SOLN
8.0000 mg | INTRAVENOUS | Status: AC
  Administered 2018-12-17: 8 mg via INTRAMUSCULAR
  Filled 2018-12-17: qty 1

## 2018-12-17 MED ORDER — EPHEDRINE 5 MG/ML INJ
10.0000 mg | INTRAVENOUS | Status: DC | PRN
  Filled 2018-12-17: qty 2

## 2018-12-17 MED ORDER — WITCH HAZEL-GLYCERIN EX PADS: 1.0000 "application " | MEDICATED_PAD | CUTANEOUS | Status: DC | PRN

## 2018-12-17 MED ORDER — LACTATED RINGERS IV SOLN: 500.0000 mL | Freq: Once | INTRAVENOUS | Status: DC

## 2018-12-17 MED ORDER — OXYCODONE-ACETAMINOPHEN 5-325 MG PO TABS: 2.0000 | ORAL_TABLET | ORAL | Status: DC | PRN

## 2018-12-17 MED ORDER — ACETAMINOPHEN 325 MG PO TABS: 650.0000 mg | ORAL_TABLET | ORAL | Status: DC | PRN

## 2018-12-17 MED ORDER — LACTATED RINGERS IV SOLN
INTRAVENOUS | Status: DC
  Administered 2018-12-17 (×3): via INTRAVENOUS

## 2018-12-17 MED ORDER — BENZOCAINE-MENTHOL 20-0.5 % EX AERO: 1.0000 "application " | INHALATION_SPRAY | CUTANEOUS | Status: DC | PRN

## 2018-12-17 MED ORDER — PHENYLEPHRINE 40 MCG/ML (10ML) SYRINGE FOR IV PUSH (FOR BLOOD PRESSURE SUPPORT)
80.0000 ug | PREFILLED_SYRINGE | INTRAVENOUS | Status: DC | PRN
  Filled 2018-12-17 (×2): qty 10

## 2018-12-17 MED ORDER — TETANUS-DIPHTH-ACELL PERTUSSIS 5-2.5-18.5 LF-MCG/0.5 IM SUSP: 0.5000 mL | Freq: Once | INTRAMUSCULAR | Status: DC

## 2018-12-17 MED ORDER — ACETAMINOPHEN 325 MG PO TABS
650.0000 mg | ORAL_TABLET | ORAL | Status: DC | PRN
  Administered 2018-12-18: 650 mg via ORAL
  Filled 2018-12-17: qty 2

## 2018-12-17 MED ORDER — LIDOCAINE HCL (PF) 1 % IJ SOLN
INTRAMUSCULAR | Status: DC | PRN
  Administered 2018-12-17 (×2): 4 mL via EPIDURAL

## 2018-12-17 MED ORDER — PHENYLEPHRINE 40 MCG/ML (10ML) SYRINGE FOR IV PUSH (FOR BLOOD PRESSURE SUPPORT)
80.0000 ug | PREFILLED_SYRINGE | INTRAVENOUS | Status: DC | PRN
  Filled 2018-12-17: qty 10

## 2018-12-17 MED ORDER — OXYTOCIN 40 UNITS IN LACTATED RINGERS INFUSION - SIMPLE MED: 1.0000 m[IU]/min | INTRAVENOUS | Status: DC

## 2018-12-17 MED ORDER — DIBUCAINE 1 % RE OINT: 1.0000 "application " | TOPICAL_OINTMENT | RECTAL | Status: DC | PRN

## 2018-12-17 MED ORDER — MEDROXYPROGESTERONE ACETATE 150 MG/ML IM SUSP
150.0000 mg | Freq: Once | INTRAMUSCULAR | Status: AC
  Administered 2018-12-19: 150 mg via INTRAMUSCULAR
  Filled 2018-12-17: qty 1

## 2018-12-17 MED ORDER — OXYCODONE-ACETAMINOPHEN 5-325 MG PO TABS: 1.0000 | ORAL_TABLET | ORAL | Status: DC | PRN

## 2018-12-17 MED ORDER — DIPHENHYDRAMINE HCL 25 MG PO CAPS
25.0000 mg | ORAL_CAPSULE | Freq: Four times a day (QID) | ORAL | Status: DC | PRN
  Administered 2018-12-17: 25 mg via ORAL
  Filled 2018-12-17: qty 1

## 2018-12-17 MED ORDER — OXYTOCIN 40 UNITS IN LACTATED RINGERS INFUSION - SIMPLE MED
2.5000 [IU]/h | INTRAVENOUS | Status: DC
  Filled 2018-12-17: qty 1000

## 2018-12-17 MED ORDER — SIMETHICONE 80 MG PO CHEW: 80.0000 mg | CHEWABLE_TABLET | ORAL | Status: DC | PRN

## 2018-12-17 MED ORDER — IBUPROFEN 100 MG/5ML PO SUSP
600.0000 mg | Freq: Four times a day (QID) | ORAL | Status: DC
  Administered 2018-12-17 – 2018-12-18 (×4): 600 mg via ORAL
  Filled 2018-12-17 (×8): qty 30

## 2018-12-17 MED ORDER — ONDANSETRON HCL 4 MG/2ML IJ SOLN: 4.0000 mg | Freq: Four times a day (QID) | INTRAMUSCULAR | Status: DC | PRN

## 2018-12-17 MED ORDER — IBUPROFEN 600 MG PO TABS
600.0000 mg | ORAL_TABLET | Freq: Four times a day (QID) | ORAL | Status: DC
  Filled 2018-12-17: qty 1

## 2018-12-17 MED ORDER — DIPHENHYDRAMINE HCL 50 MG/ML IJ SOLN: 12.5000 mg | INTRAMUSCULAR | Status: DC | PRN

## 2018-12-17 MED ORDER — ARIPIPRAZOLE 15 MG PO TABS
15.0000 mg | ORAL_TABLET | Freq: Every day | ORAL | Status: DC
  Administered 2018-12-17 – 2018-12-18 (×2): 15 mg via ORAL
  Filled 2018-12-17 (×3): qty 1

## 2018-12-17 MED ORDER — ZOLPIDEM TARTRATE 5 MG PO TABS: 5.0000 mg | ORAL_TABLET | Freq: Every evening | ORAL | Status: DC | PRN

## 2018-12-17 MED ORDER — FENTANYL 2.5 MCG/ML BUPIVACAINE 1/10 % EPIDURAL INFUSION (WH - ANES)
14.0000 mL/h | INTRAMUSCULAR | Status: DC | PRN
  Administered 2018-12-17: 14 mL/h via EPIDURAL
  Filled 2018-12-17: qty 100

## 2018-12-17 MED ORDER — ONDANSETRON HCL 4 MG/2ML IJ SOLN: 4.0000 mg | INTRAMUSCULAR | Status: DC | PRN

## 2018-12-17 MED ORDER — SENNOSIDES-DOCUSATE SODIUM 8.6-50 MG PO TABS
2.0000 | ORAL_TABLET | ORAL | Status: DC
  Administered 2018-12-17: 2 via ORAL
  Filled 2018-12-17: qty 2

## 2018-12-17 MED ORDER — LACTATED RINGERS IV SOLN
500.0000 mL | INTRAVENOUS | Status: DC | PRN
  Administered 2018-12-17: 500 mL via INTRAVENOUS

## 2018-12-17 MED ORDER — BUSPIRONE HCL 10 MG PO TABS
10.0000 mg | ORAL_TABLET | Freq: Two times a day (BID) | ORAL | Status: DC
  Administered 2018-12-17 – 2018-12-18 (×3): 10 mg via ORAL
  Filled 2018-12-17 (×5): qty 1

## 2018-12-17 MED ORDER — ONDANSETRON HCL 4 MG PO TABS: 4.0000 mg | ORAL_TABLET | ORAL | Status: DC | PRN

## 2018-12-17 MED ORDER — FENTANYL CITRATE (PF) 100 MCG/2ML IJ SOLN
100.0000 ug | INTRAMUSCULAR | Status: DC | PRN
  Administered 2018-12-17: 100 ug via INTRAVENOUS
  Filled 2018-12-17: qty 2

## 2018-12-17 MED ORDER — COCONUT OIL OIL: 1.0000 "application " | TOPICAL_OIL | Status: DC | PRN

## 2018-12-17 NOTE — Progress Notes (Signed)
Vitals:   12/17/18 0630 12/17/18 0700  BP: 114/65 113/64  Pulse: (!) 54 (!) 49  Resp:  18  Temp:     cx now 6/100/-2.  FHR Cat 1.  Ctx not tracing, but progress is being made. Wants Lattie Haw LK to deliver, so will hold off on AROM. COmfortable w/epidural.
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
## 2018-12-17 NOTE — Progress Notes (Signed)
[name] is a 33 y.o. [phone] at [redacted]w[redacted]d by LMP admitted for induction of labor due to Hypertension.  Subjective: Patient resting in right lateral position, experienced nausea/vomiting after her cervical exam. Wants to wait for AROM, feeling pressure in her bottom, tolerated CE well.  Objective: BP 125/73   Pulse (!) 54   Temp 97.9 F (36.6 C) (Oral)   Resp 18   Ht 5\' 4"  (1.626 m)   Wt 85.6 kg   LMP 04/02/2018   BMI 32.39 kg/m  No intake/output data recorded. No intake/output data recorded.  FHT:  FHR: 125 bpm, variability: moderate,  accelerations:  Present,  decelerations:  Absent UC:   regular, every 2-3 minutes Dilation: 6 Effacement (%): 100 Station: -3 Presentation: Vertex Exam by:: CNM Cresenzo- Dishmon  Labs: Lab Results  Component Value Date   WBC 10.8 (H) 12/17/2018   HGB 10.7 (L) 12/17/2018   HCT 33.8 (L) 12/17/2018   MCV 93.1 12/17/2018   PLT 162 12/17/2018    Assessment / Plan: IOL for gHTN, progressing well  Labor: Progressing normally Preeclampsia:  no signs or symptoms of toxicity Fetal Wellbeing:  Category I Pain Control:  Epidural I/D:  n/a Anticipated MOD:  NSVD  Gabriel Carina, SNM 12/17/2018, 10:04 AM
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
## 2018-12-17 NOTE — Anesthesia Procedure Notes (Signed)
Epidural Patient location during procedure: OB  Staffing Anesthesiologist: Luwanna Brossman, MD Performed: anesthesiologist   Preanesthetic Checklist Completed: patient identified, site marked, surgical consent, pre-op evaluation, timeout performed, IV checked, risks and benefits discussed and monitors and equipment checked  Epidural Patient position: sitting Prep: DuraPrep Patient monitoring: heart rate, continuous pulse ox and blood pressure Approach: right paramedian Location: L3-L4 Injection technique: LOR saline  Needle:  Needle type: Tuohy  Needle gauge: 17 G Needle length: 9 cm and 9 Needle insertion depth: 6 cm Catheter type: closed end flexible Catheter size: 20 Guage Catheter at skin depth: 10 cm Test dose: negative  Assessment Events: blood not aspirated, injection not painful, no injection resistance, negative IV test and no paresthesia  Additional Notes Patient identified. Risks/Benefits/Options discussed with patient including but not limited to bleeding, infection, nerve damage, paralysis, failed block, incomplete pain control, headache, blood pressure changes, nausea, vomiting, reactions to medication both or allergic, itching and postpartum back pain. Confirmed with bedside nurse the patient's most recent platelet count. Confirmed with patient that they are not currently taking any anticoagulation, have any bleeding history or any family history of bleeding disorders. Patient expressed understanding and wished to proceed. All questions were answered. Sterile technique was used throughout the entire procedure. Please see nursing notes for vital signs. Test dose was given through epidural needle and negative prior to continuing to dose epidural or start infusion. Warning signs of high block given to the patient including shortness of breath, tingling/numbness in hands, complete motor block, or any concerning symptoms with instructions to call for help. Patient was given  instructions on fall risk and not to get out of bed. All questions and concerns addressed with instructions to call with any issues.     

## 2018-12-17 NOTE — Anesthesia Preprocedure Evaluation (Signed)
Anesthesia Evaluation  Patient identified by MRN, date of birth, ID band Patient awake    Reviewed: Allergy & Precautions, H&P , NPO status , Patient's Chart, lab work & pertinent test results  History of Anesthesia Complications Negative for: history of anesthetic complications  Airway Mallampati: II  TM Distance: >3 FB Neck ROM: full    Dental no notable dental hx. (+) Teeth Intact   Pulmonary asthma , sleep apnea ,    Pulmonary exam normal breath sounds clear to auscultation       Cardiovascular hypertension, Normal cardiovascular exam Rhythm:regular Rate:Normal     Neuro/Psych negative neurological ROS  negative psych ROS   GI/Hepatic negative GI ROS, Neg liver ROS,   Endo/Other  negative endocrine ROS  Renal/GU negative Renal ROS  negative genitourinary   Musculoskeletal   Abdominal   Peds  Hematology negative hematology ROS (+)   Anesthesia Other Findings PIH  Reproductive/Obstetrics (+) Pregnancy                             Anesthesia Physical Anesthesia Plan  ASA: II  Anesthesia Plan: Epidural   Post-op Pain Management:    Induction:   PONV Risk Score and Plan:   Airway Management Planned:   Additional Equipment:   Intra-op Plan:   Post-operative Plan:   Informed Consent: I have reviewed the patients History and Physical, chart, labs and discussed the procedure including the risks, benefits and alternatives for the proposed anesthesia with the patient or authorized representative who has indicated his/her understanding and acceptance.     Plan Discussed with:   Anesthesia Plan Comments:         Anesthesia Quick Evaluation

## 2018-12-17 NOTE — H&P (Signed)
[name] is a 33 y.o. female [phone] with IUP at [redacted]w[redacted]d presenting for contractions . Pt states she has been having irregular, every 3-5 minutes contractions, associated with none vaginal bleeding for several hours..  Membranes are intact, with active fetal movement.   [facility]   Prenatal History/Complications:  GHTN this pg, IOL scheduled for today  Past Medical History: Past Medical History:  Diagnosis Date  . Anxiety   . Asthma    inhaler PRN-hasn't used inhaler in months  . Attention deficit hyperactivity disorder   . Bipolar disorder [facility])    hospitalized as a teen for suicidal ideations and cutting  . Depression    no complaints, doing well  . Gastritis   . GERD (gastroesophageal reflux disease)   . HPV (human papilloma virus) anogenital infection   . Hx of varicella   . Mild preeclampsia 02/09/2016  . Pregnancy induced hypertension   . Vaginal Pap smear, abnormal     Past Surgical History: Past Surgical History:  Procedure Laterality Date  . EYE MUSCLE SURGERY Bilateral    07/29/12  . MOUTH SURGERY    . PLANTAR FASCIA SURGERY    . WISDOM TOOTH EXTRACTION      Obstetrical History: OB History    Gravida  4   Para  3   Term  2   Preterm  1   AB  0   Living  3     SAB  0   TAB      Ectopic      Multiple  0   Live Births  3            Social History: Social History   Socioeconomic History  . Marital status: Married    Spouse name: Not on file  . Number of children: 0  . Years of education: Not on file  . Highest education level: Not on file  Occupational History  . Not on file  Social Needs  . Financial resource strain: Not hard at all  . Food insecurity:    Worry: Never true    Inability: Never true  . Transportation needs:    Medical: No    Non-medical: Not on file  Tobacco Use  . Smoking status: Never Smoker  . Smokeless tobacco: Never Used  Substance and Sexual Activity  . Alcohol use: No     Alcohol/week: 0.0 standard drinks  . Drug use: No  . Sexual activity: Yes    Birth control/protection: None  Lifestyle  . Physical activity:    Days per week: Not on file    Minutes per session: Not on file  . Stress: Not at all  Relationships  . Social connections:    Talks on phone: Not on file    Gets together: Not on file    Attends religious service: Not on file    Active member of club or organization: Not on file    Attends meetings of clubs or organizations: Not on file    Relationship status: Not on file  Other Topics Concern  . Not on file  Social History Narrative  . Not on file    Family History: Family History  Problem Relation Age of Onset  . Depression Mother   . Colon cancer Mother   . Colon polyps Mother   . Depression Father   . Depression Brother   . Liver disease Other   . Kidney disease Other     Allergies: Allergies  Allergen Reactions  . Depakote [Divalproex Sodium] Other (See Comments)    Pt states that this medication makes her blood levels toxic.    [name] Methylphenidate Derivatives Other (See Comments)    Reaction:  Depression and anger   . Neurontin [Gabapentin] Other (See Comments)    Reaction:  Dizziness   . Prozac [Fluoxetine Hcl] Other (See Comments)    Reaction:  Anger     Medications Prior to Admission  Medication Sig Dispense Refill Last Dose  . ARIPiprazole (ABILIFY) 15 MG tablet Take 1 tablet (15 mg total) by mouth daily. 30 tablet 2 12/17/2018 at Unknown time  . busPIRone (BUSPAR) 10 MG tablet Take 1 tablet (10 mg total) by mouth 2 (two) times daily. 60 tablet 2 12/17/2018 at Unknown time  . Prenatal Vit-Fe Fumarate-FA (PRENATAL MULTIVITAMIN) TABS tablet Take 1 tablet by mouth at bedtime. 30 tablet 11 12/17/2018 at Unknown time  . butalbital-acetaminophen-caffeine (FIORICET, ESGIC) 50-325-40 MG tablet Take 1 tablet by mouth every 6 (six) hours as needed for headache. (Patient not taking: Reported on 11/25/2018) 20 tablet 0 NOT TAKIGN         Review of Systems   Constitutional: Negative for fever and chills Eyes: Negative for visual disturbances Respiratory: Negative for shortness of breath, dyspnea Cardiovascular: Negative for chest pain or palpitations  Gastrointestinal: Negative for vomiting, diarrhea and constipation.  POSITIVE for abdominal pain (contractions) Genitourinary: Negative for dysuria and urgency Musculoskeletal: Negative for back pain, joint pain, myalgias  Neurological: Negative for dizziness and headaches      Blood pressure 109/63, pulse (!) 51, temperature 98 F (36.7 C), temperature source Oral, resp. rate 16, height 5\' 4"  (1.626 m), weight 85.6 kg, last menstrual period 04/02/2018, unknown if currently breastfeeding. General appearance: alert, cooperative and no distress Lungs: clear to auscultation bilaterally Heart: regular rate and rhythm Abdomen: soft, non-tender; bowel sounds normal Extremities: Homans sign is negative, no sign of DVT DTR's 2+ Presentation: cephalic Fetal monitoring  Baseline: 140 bpm, Variability: Good {> 6 bpm), Accelerations: Reactive and Decelerations: Absent Uterine activity  4-5 Dilation: 5 Effacement (%): 80 Station: -2 Exam by:: Jack Quarto, RN    Prenatal Transfer Tool  Maternal Diabetes: No Genetic Screening: Normal Maternal Ultrasounds/Referrals: Normal Fetal Ultrasounds or other Referrals:  None Maternal Substance Abuse:  No Significant Maternal Medications:  None Significant Maternal Lab Results: None     Results for orders placed or performed during the hospital encounter of 12/17/18 (from the past 24 hour(s))  CBC   Collection Time: 12/17/18  1:57 AM  Result Value Ref Range   WBC 10.8 (H) 4.0 - 10.5 K/uL   RBC 3.63 (L) 3.87 - 5.11 MIL/uL   Hemoglobin 10.7 (L) 12.0 - 15.0 g/dL   HCT 33.8 (L) 36.0 - 46.0 %   MCV 93.1 80.0 - 100.0 fL   MCH 29.5 26.0 - 34.0 pg   MCHC 31.7 30.0 - 36.0 g/dL   RDW 14.5 11.5 - 15.5 %   Platelets  162 150 - 400 K/uL   nRBC 0.0 0.0 - 0.2 %  Type and screen   Collection Time: 12/17/18  1:57 AM  Result Value Ref Range   ABO/RH(D) O POS    Antibody Screen NEG    Sample Expiration      12/20/2018 Performed at [facility], [address], [place], [place] [address]     Nursing Staff Provider  Office Location  [facility]  Dating  5.6 week [place] and LMP  Language  [place]  Anatomy US  Normal  Flu Vaccine   Genetic Screen  Panorama: low risk    TDaP vaccine    Hgb A1C or  GTT Early  Third trimester   Rhogam     LAB RESULTS   Feeding Plan Bottle Blood Type O/Positive/-- (07/30 1459)   Contraception BTL [ ]  sign BTL papers at 28 weeks Antibody Negative (07/30 1459)  Circumcision Yes if a boy  Rubella 4.10 (07/30 1459)  [facility] RPR Non Reactive (07/30 1459)   Support Person [name]  HBsAg Negative (07/30 1459)   Prenatal Classes Not interested  HIV Non Reactive (07/30 1459)  BTL Consent N/A GBS Negative (02/28 1448)(For PCN allergy, check sensitivities)     Pap Cytology negative, HPV HR pos neg for 16/18/31    Hgb Electro      CF Negative    SMA SMN1 copy number: 2 (Reduced Carrier Risk)   Assessment: JOSALYNN JOHNDROW is a 32 y.o. [phone] with an IUP at [redacted]w[redacted]d presenting for labor, previous dx of GHTN, BP normal today  Plan: #Labor: expectant management #Pain:  Per request #FWB Cat 1    Christin Fudge 12/17/2018, 6:09 AM

## 2018-12-18 NOTE — Progress Notes (Signed)
Post Partum Day 1 Subjective: No complaints, up ad lib, voiding and tolerating PO. Mild lochia. Bottlefeeding. Baby is at bedside.   Objective: Blood pressure 106/68, pulse (!) 58, temperature 98.6 F (37 C), temperature source Oral, resp. rate 18, height 5\' 4"  (1.626 m), weight 85.6 kg, last menstrual period 04/02/2018, SpO2 99 %, unknown if currently breastfeeding.  Physical Exam:  General: alert and no distress Lochia: appropriate Uterine Fundus: deferred by patient DVT Evaluation: deferred by patient  Recent Labs    12/17/18 0157 12/17/18 1407  HGB 10.7* 10.7*  HCT 33.8* 34.4*    Assessment/Plan: Plan for discharge tomorrow and Contraception DMPA prior to discharge then interval BTL   LOS: 1 day   Verita Schneiders, MD 12/18/2018, 10:07 AM

## 2018-12-18 NOTE — Clinical Social Work Maternal (Addendum)
CLINICAL SOCIAL WORK MATERNAL/CHILD NOTE  Patient Details  Name: [name] MRN: [id number] Date of Birth: 07/08/1986  Date:  04-Dec-2019  Clinical Social Worker Initiating Note:  [name] Date/Time: Initiated:  12/18/18/1522     Child's Name:  [name]   Biological Parents:  Mother, Father   Need for Interpreter:  None   Reason for Referral:  Behavioral Health Concerns, Other (Comment)(CPS hx. )   Address:  [place] [address]    Phone number:  [phone] (home)     Additional phone number:   Household Members/Support Persons (HM/SP):   Household Member/Support Person 1, Household Member/Support Person 2(([name] has 2 older children that are currently in kinship placement [name] 02/11/2016 lives with [name]'s cousin [name] and [name].  02/03/2017 lives with [name]'s aunt, [name]).))   HM/SP Name Relationship DOB or Age  HM/SP -[place] [name] 06/12/1979  HM/SP -2 [name] son 02/19/2018  HM/SP -3        HM/SP -4        HM/SP -5        HM/SP -6        HM/SP -7        HM/SP -8          Natural Supports (not living in the home):  Extended Family, Immediate Family   Professional Supports: Therapist([name] reports that she receives outpatient counseling and medication management with [name]. )   Employment: Disabled   Type of Work:     Education:  [place] arranged:    Museum/gallery curator Resources:  Medicaid, Community education officer, [employer]    Other Resources:  [employer], Physicist, medical    Cultural/Religious Considerations Which May Impact Care:  None reported  Strengths:  Ability to meet basic needs , Compliance with medical plan , Home prepared for child , Psychotropic Medications, Understanding of illness, Pediatrician chosen   Psychotropic Medications:  Buspar      Pediatrician:    [place]  Pediatrician List:   [facility] for [place]      Pediatrician Fax Number:    Risk Factors/Current Problems:  Mental Health Concerns , DHHS Involvement ([name]'s CPS worker is [name])   Cognitive State:  Alert , Able to Concentrate , Linear Thinking    Mood/Affect:  Interested , Relaxed , Comfortable , Apprehensive    CSW Assessment: CSW met with [name] in room 115 to complete an assessment for CPS hx (older children in kinship placement) and MH hx.  When CSW arrived, [name] was resting in bed, [name] was sitting in the recliner, and infant was asleep in the bassinet.  [name] gave CSW permission to meet with [name] while [name] was present. The scent from the room was evidence that the family had poor hygiene.  However, during the assessment they were attentive of the baby and very supportive of one another.   CSW asked about CPS hx and [name] and [name] openly shared that they do not currently have an open CPS case.  [name] reported that the family's last case closed May 2019. [name] asked several questions regarding CPS involvement and [name] made the family aware that due to the family's CPS hx CSW will need to make another CPS case; [name] was understanding.   [name] and [name] reported having  all essential items and feels prepared to parent.  Both parents were knowledgeable about SIDS and responded appropriately to CSW questions about Safe Sleep.  CSW asked about [name]'s MH hx and [name] acknowledged a hx of bipolar disorder and anxiety.  [name] shared that [name] discontinued that use of all her medication (Buspar) at doctors request during [name]'s 1st trimester.  Per [name], [name] plans to call [name] to schedule follow-up mental health appointment.   CSW provided education regarding the baby blues period vs. perinatal mood disorders, discussed treatment and gave resources for mental health follow up if concerns arise.  CSW recommends self-evaluation during the postpartum time period  using the [name] from Postpartum Progress and encouraged [name] to contact a medical professional if symptoms are noted at any time. CSW assessed for safety and [name] and [name] denied SI, HI, DV.   At the request of [name], CSW provided [name] with 1 food vouchers for hospital cafeteria  CPS case has assigned to [name].   CSW Plan/Description:  No Further Intervention Required/No Barriers to Discharge, Sudden Infant Death Syndrome (SIDS) Education, Perinatal Mood and Anxiety Disorder (PMADs) Education, Other Patient/Family Education, Other Information/Referral to [employer], Child Protective Service Report    [name], MSW, LCSW Clinical Social Work [phone]   [name], [place] 01/12/19, 3:30 PM

## 2018-12-18 NOTE — Progress Notes (Signed)
Pt has refused fundal checks and assessments throughout this shift. RN entered the room at 0515 for pt's fundal check and she said "Don't do that right now". RN explained the purpose of the assessment was to ensure her bleeding was under control and she was safe/healthy. Pt stated "well it hurts when y'all push on my belly".  RN reiterated the purpose of the exam and explained that she was getting ibuprofen beforehand. Pt stated "well I don't want my motrin because I'm not hurting". RN asked if I could proceed with fundal check and she said "yeah but this is the last time" and got up to use the bathroom first.   At this time lab entered the room to draw mom's blood for CBC. RN asked lab to come back later. Mom inquired about the lab visit and RN explained that she needed to have blood drawn and she said "well I refuse that. And I refuse you pressing my belly too".   Pt's vital signs are stable and she stated that her bleeding was "fine" when she went to the bathroom. RN will continue to monitor.  Gearldine Bienenstock, RN 12/18/2018 6:47 AM

## 2018-12-18 NOTE — Progress Notes (Signed)
Pt refused to have a fundal check. Nurse verbalized the importance of having fundal assessments. Pt still declined fundal checks and assessment. Will continue to monitor.

## 2018-12-18 NOTE — Anesthesia Postprocedure Evaluation (Signed)
Anesthesia Post Note  Patient: [name]  Procedure(s) Performed: AN AD HOC LABOR EPIDURAL     Patient location during evaluation: Mother Baby Anesthesia Type: Epidural Level of consciousness: awake and alert Pain management: pain level controlled Vital Signs Assessment: post-procedure vital signs reviewed and stable Respiratory status: spontaneous breathing, nonlabored ventilation and respiratory function stable Cardiovascular status: stable Postop Assessment: no headache, no backache, epidural receding, no apparent nausea or vomiting, patient able to bend at knees, able to ambulate and adequate PO intake Anesthetic complications: no    Last Vitals:  Vitals:   12/18/18 0100 12/18/18 0530  BP: 116/79 106/68  Pulse: 80 (!) 58  Resp: 16 18  Temp: 36.8 C 37 C  SpO2:  99%    Last Pain:  Vitals:   12/18/18 0530  TempSrc: Oral  PainSc: 0-No pain   Pain Goal:                 [employer]

## 2018-12-18 NOTE — Lactation Note (Addendum)
This note was copied from a baby's chart. Lactation Consultation Note  Patient Name: [name] PQAES'L Date: 12/18/2018  Mom and baby STS.  Mom reports she has not tried to breastfeed or pump today but would like to pump.  Mom reports some  one showed her the pump earlier but she cant remember it. Mom reports she has a manual pump and someone showed her that but she cant remember it either.  Assisted mom with using DEBP while infant STS.  Infant got fussy and mom said turn pump off and she will try again later.  Demo how to use manual pump as both single and double manual pump. Urged mom to call lactation as needed.   Maternal Data    Feeding Feeding Type: Bottle Fed - Formula  LATCH Score                   Interventions    Lactation Tools Discussed/Used     Consult Status      [name] 12/18/2018, 5:32 PM

## 2018-12-19 ENCOUNTER — Encounter (HOSPITAL_COMMUNITY): Payer: Self-pay | Admitting: *Deleted

## 2018-12-19 MED ORDER — IBUPROFEN 100 MG/5ML PO SUSP: 600.0000 mg | Freq: Four times a day (QID) | ORAL | 0 refills | Status: AC

## 2018-12-19 NOTE — Progress Notes (Signed)
Patient refused 0600 vital signs and medication at this time.

## 2018-12-19 NOTE — Discharge Summary (Signed)
OB Discharge Summary     Patient Name: [name] DOB: 04/21/1986 MRN: [id number]  Date of admission: 12/17/2018 Delivering midwife: Fatima Blank  Date of discharge: 12/19/2018  Admitting diagnosis: 37WKS CTX Intrauterine pregnancy: [redacted]w[redacted]d     Secondary diagnosis:  Active Problems:   Gestational hypertension   Discharge diagnosis: Term Pregnancy Delivered and Gestational Hypertension                                                                                                Post partum procedures:none Augmentation: none Complications: None  Hospital course:  Induction of Labor With Vaginal Delivery   33 y.o. yo [phone] at [redacted]w[redacted]d was admitted to the hospital 12/17/2018 for induction of labor.  Indication for induction: Gestational hypertension.  Patient had an uncomplicated labor course as follows: Patient admitted on 1/3. 5cm dilation at admission. Managed expectantly throughout, progressed to complete around 3 hours after admission. Delivered with no complications. Post-partum course was routine with no complications. Received depo prior to discharge with plan for interval BTL.  Membrane Rupture Time/Date: 10:49 AM ,12/17/2018   Intrapartum Procedures: Episiotomy: None [1]                                         Lacerations:  None [1]  Patient had delivery of a Viable infant.  Information for the patient's newborn:  Christiona, Siddique [[id number]]  Delivery Method: Vag-Spont(Filed from delivery)   12/17/2018  Details of delivery can be found in separate delivery note.  Patient had a routine postpartum course. Patient is discharged home 12/19/18.  Physical exam  Vitals:   12/18/18 0530 12/18/18 1519 12/18/18 2238 12/19/18 0552  BP: 106/68 117/74 124/72 125/83  Pulse: (!) 58 81 85 85  Resp: 18 18  18   Temp: 98.6 F (37 C)  98.4 F (36.9 C) 98.7 F (37.1 C)  TempSrc: Oral  Oral Oral  SpO2: 99% 99% 98% 98%  Weight:      Height:       General: alert, tired  appearing but NAD Lochia: appropriate Uterine Fundus: declined exam but reports no clots, minimal pain Incision: N/A DVT Evaluation: No significant calf/ankle edema. Labs: Lab Results  Component Value Date   WBC 11.7 (H) 12/17/2018   HGB 10.7 (L) 12/17/2018   HCT 34.4 (L) 12/17/2018   MCV 94.2 12/17/2018   PLT 138 (L) 12/17/2018   CMP Latest Ref Rng & Units 12/04/2018  Glucose 70 - 99 mg/dL 73  BUN 6 - 20 mg/dL 9  Creatinine 0.44 - 1.00 mg/dL 0.70  Sodium 135 - 145 mmol/L 136  Potassium 3.5 - 5.1 mmol/L 4.0  Chloride 98 - 111 mmol/L 105  CO2 22 - 32 mmol/L 24  Calcium 8.9 - 10.3 mg/dL 8.8(L)  Total Protein 6.5 - 8.1 g/dL 7.2  Total Bilirubin 0.3 - 1.2 mg/dL 0.8  Alkaline Phos 38 - 126 U/L 100  AST 15 - 41 U/L 16  ALT 0 - 44 U/L 8  Discharge instruction: per After Visit Summary and "Baby and Me Booklet".  After visit meds:  Allergies as of 12/19/2018      Reactions   Depakote [divalproex Sodium] Other (See Comments)   Pt states that this medication makes her blood levels toxic.     Methylphenidate Derivatives Other (See Comments)   Reaction:  Depression and anger    Neurontin [gabapentin] Other (See Comments)   Reaction:  Dizziness    Prozac [fluoxetine Hcl] Other (See Comments)   Reaction:  Anger       Medication List    STOP taking these medications   butalbital-acetaminophen-caffeine 50-325-40 MG tablet Commonly known as:  FIORICET, ESGIC     TAKE these medications   ARIPiprazole 15 MG tablet Commonly known as:  ABILIFY Take 1 tablet (15 mg total) by mouth daily.   busPIRone 10 MG tablet Commonly known as:  BUSPAR Take 1 tablet (10 mg total) by mouth 2 (two) times daily.   ibuprofen 100 MG/5ML suspension Commonly known as:  ADVIL,MOTRIN Take 30 mLs (600 mg total) by mouth every 6 (six) hours for 5 days.   prenatal multivitamin Tabs tablet Take 1 tablet by mouth at bedtime.       Diet: routine diet  Activity: Advance as tolerated. Pelvic rest  for 6 weeks.   Outpatient follow up:4-6 weeks for postpartum visit and BTL pre-op visit  also needs BP check in 1 week (message sent to admin pool) Follow up Appt: Future Appointments  Date Time Provider [place]  01/10/2019  3:00 PM Sloan Leiter, MD [place] None   Postpartum contraception: interval BTL, papers signed 12/01/18  Newborn Data: Live born female  Birth Weight: 6 lb 5.4 oz (2875 g) APGAR: , 9  Newborn Delivery   Birth date/time:  12/17/2018 12:23:00 Delivery type:  Vaginal, Spontaneous    Baby Feeding: Bottle Disposition:home with mother  12/19/2018 Glenice Bow, DO

## 2018-12-19 NOTE — Progress Notes (Signed)
Pt refused fundal check, RN asked pt if bleeding could be assessed and pt said "maybe a little later".

## 2018-12-20 ENCOUNTER — Encounter (HOSPITAL_COMMUNITY): Payer: Self-pay

## 2018-12-30 ENCOUNTER — Telehealth: Payer: Self-pay | Admitting: Obstetrics and Gynecology

## 2018-12-30 NOTE — Telephone Encounter (Signed)
Called to reschedule BP Check appointment, patient did not feel she needed to reschedule at this time. She also ask about Postpartum Depression, I let her know she has an appointment scheduled for 1.27.2020 at 3 pm to discuss postpartum.

## 2019-01-10 ENCOUNTER — Ambulatory Visit: Payer: Medicare Other | Admitting: Obstetrics and Gynecology

## 2019-01-20 ENCOUNTER — Telehealth: Payer: Self-pay | Admitting: Obstetrics and Gynecology

## 2019-01-20 NOTE — Telephone Encounter (Signed)
Called pt to reschedule her postpartum and to schedule her BTL, the patient stated previously that she did not want to schedule a postpartum at this time. Attempted to call patient about her BTL consult and her phone was ringing busy and unable to leave message for patient to call back.

## 2019-02-07 IMAGING — US US MFM OB FOLLOW-UP
1 series · 14 of 28 positions shown · non-contrast
Comparison: none

[Series 1: us mfm ob follow-up · 34 acquisitions, 14 frames shown]
[im 2/34]
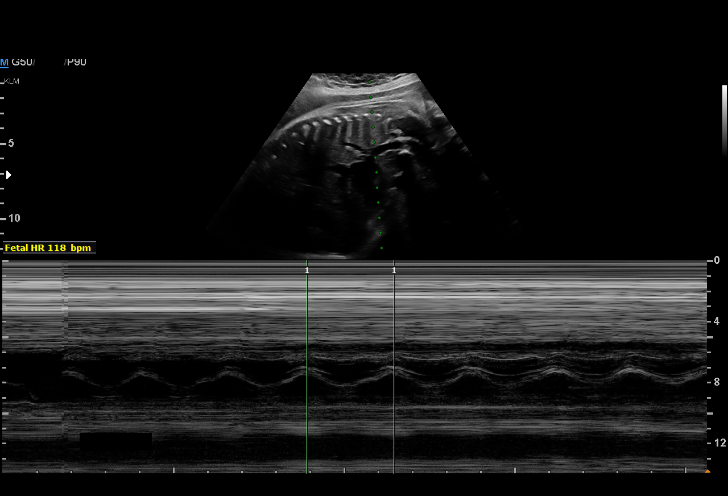
[im 4/34]
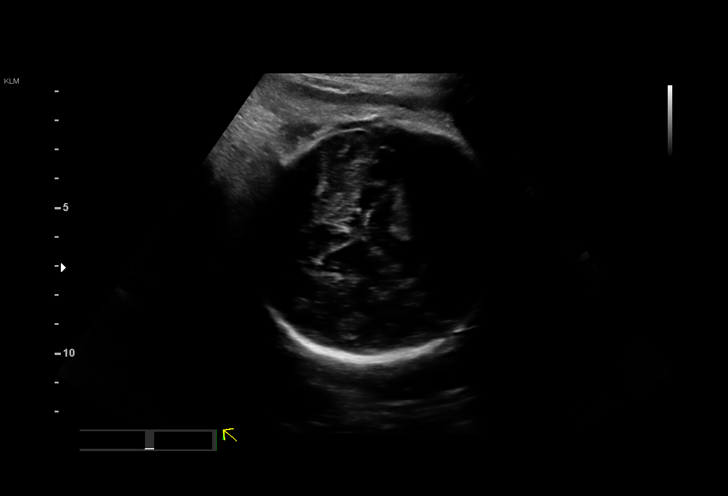
[im 7/34]
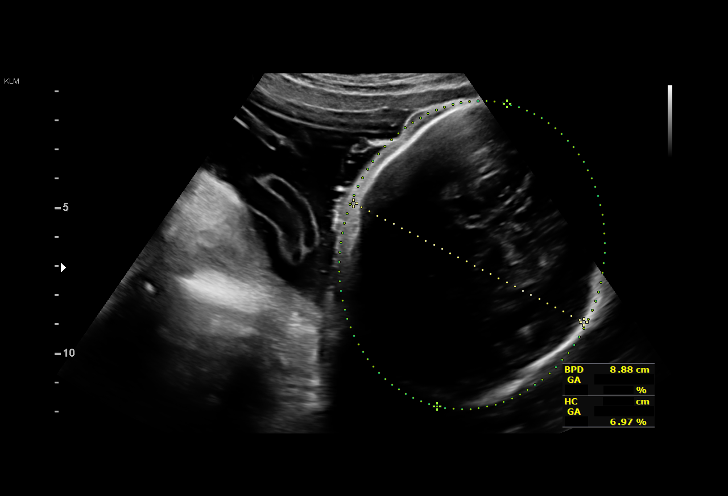
[im 9/34]
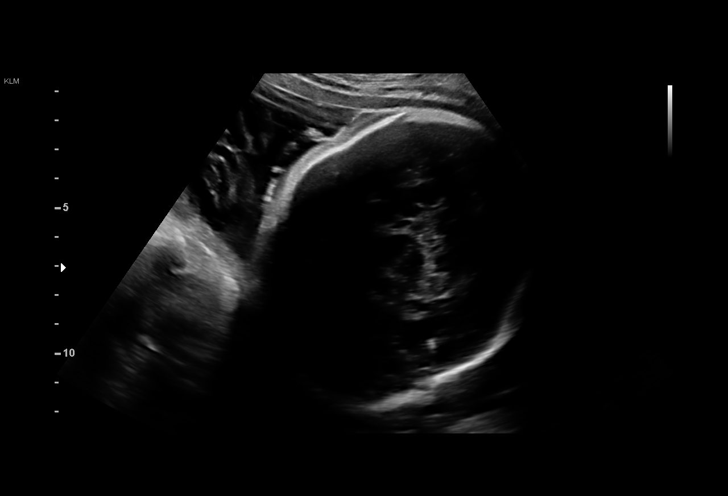
[im 12/34]
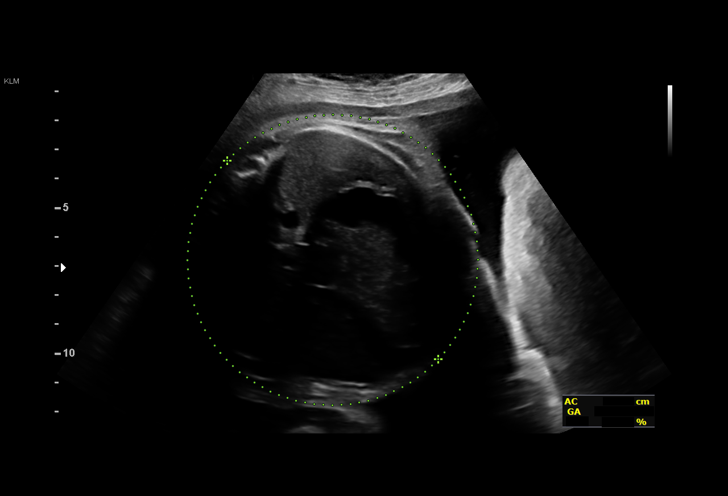
[im 14/34]
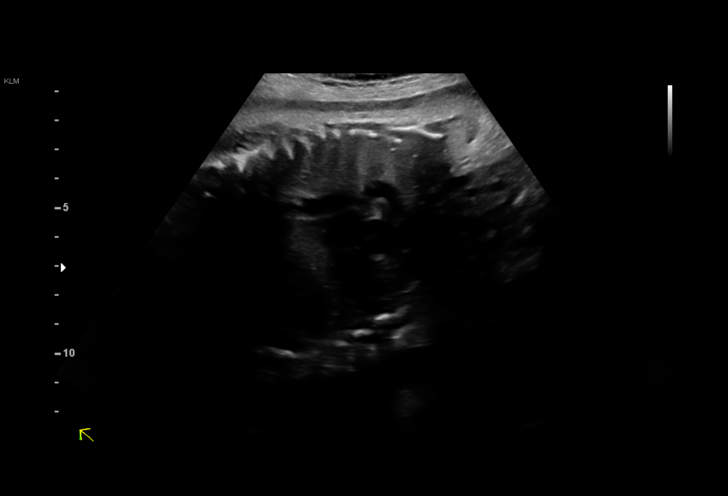
[im 16/34]
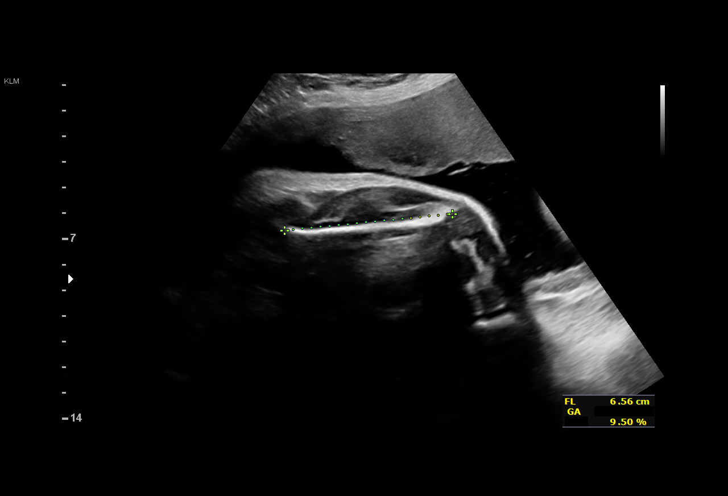
[im 19/34]
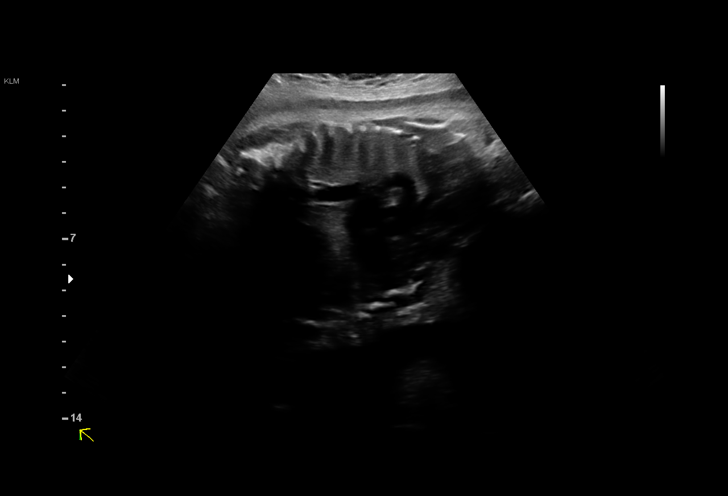
[im 21/34]
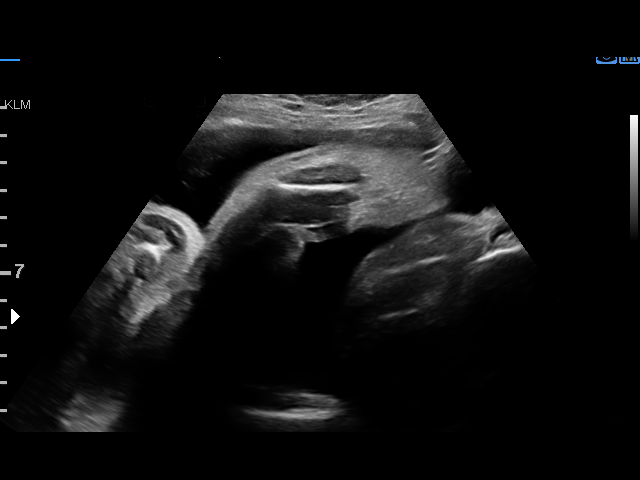
[im 24/34]
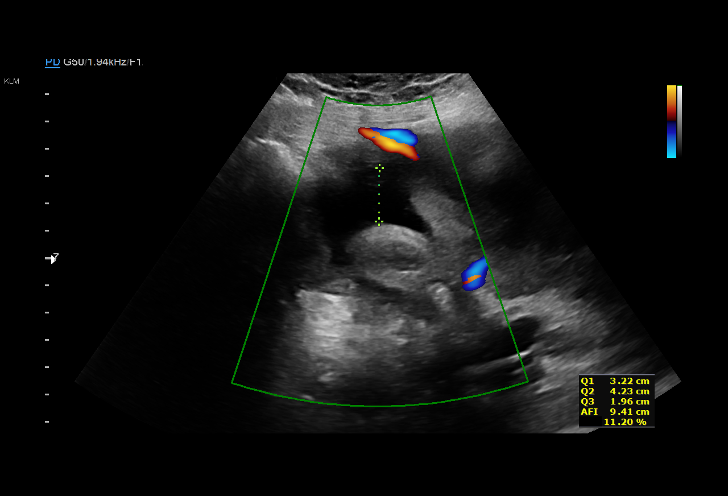
[im 26/34]
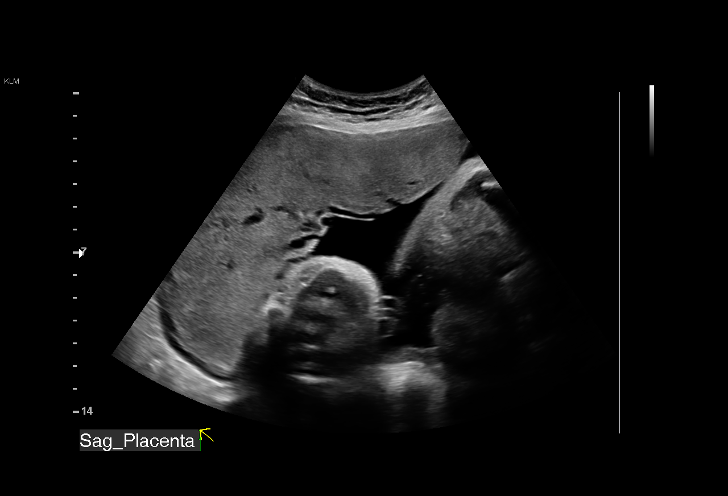
[im 29/34]
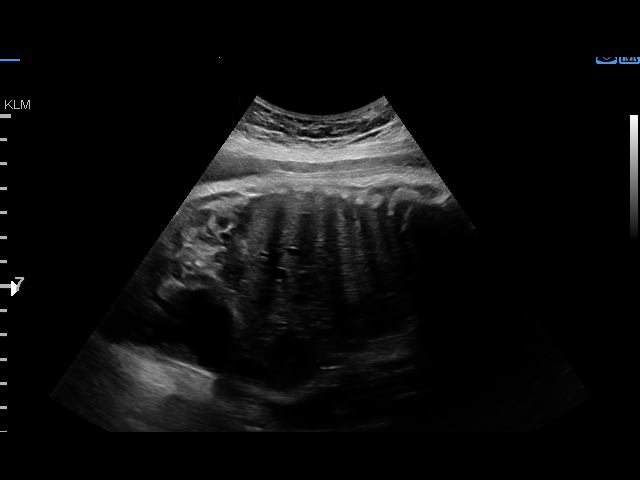
[im 31/34]
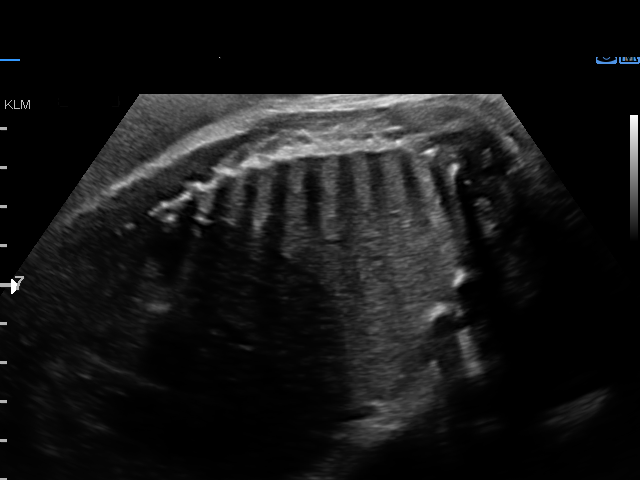
[im 34/34]
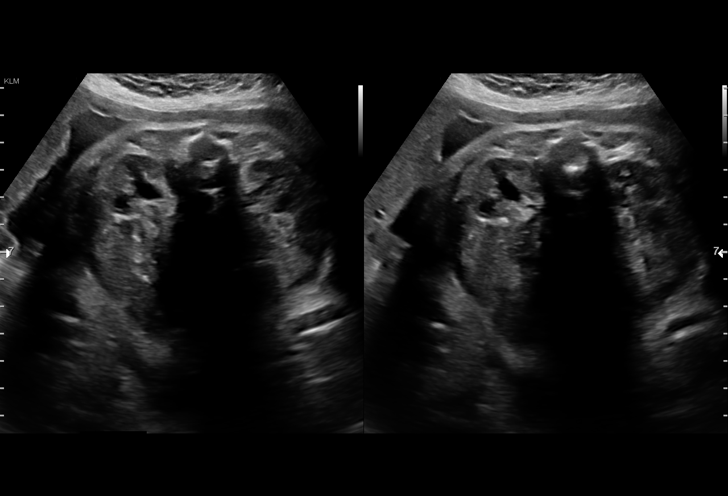

[14 of 28 positions shown; findings below may reference images not displayed]

[REDACTED]care - [HOSPITAL]

 ----------------------------------------------------------------------

 ----------------------------------------------------------------------
Indications

  Short interval between pregancies, 3rd
  trimester
  35 weeks gestation of pregnancy
  Poor obstetric history: Previous
  preeclampsia (ASA)
  Gestational hypertension without significant
  proteinuria, third trimester
  Poor obstetric history: Previous gestational
  diabetes
 ----------------------------------------------------------------------
Vital Signs

                                                Height:        5'4"
Fetal Evaluation

 Num Of Fetuses:          1
 Fetal Heart Rate(bpm):   118
 Cardiac Activity:        Observed
 Presentation:            Cephalic
 Placenta:                Anterior
 P. Cord Insertion:       Previously Visualized

 Amniotic Fluid
 AFI FV:      Within normal limits
 AFI Sum(cm)     %Tile       Largest Pocket(cm)
 13.38           46

 RUQ(cm)       RLQ(cm)       LUQ(cm)        LLQ(cm)

Biophysical Evaluation

 Amniotic F.V:   Within normal limits       F. Tone:         Observed
 F. Movement:    Observed                   Score:           [DATE]
 F. Breathing:   Observed
Biometry

 BPD:        90  mm     G. Age:  36w 4d         81  %    CI:        80.74   %    70 - 86
                                                         FL/HC:       20.7  %    20.1 -
 HC:      316.3  mm     G. Age:  35w 4d         21  %    HC/AC:       1.03       0.93 -
 AC:      307.4  mm     G. Age:  34w 5d         37  %    FL/BPD:      72.9  %    71 - 87
 FL:       65.6  mm     G. Age:  33w 6d         10  %    FL/AC:       21.3  %    20 - 24

 Est. FW:    9296   gm     5 lb 8 oz     47  %
OB History

 Gravidity:    4         Term:   2        Prem:   1        SAB:   0
 TOP:          0       Ectopic:  0        Living: 3
Gestational Age

 LMP:           35w 3d        Date:  04/02/18                 EDD:   01/07/19
 U/S Today:     35w 1d                                        EDD:   01/09/19
 Best:          35w 3d     Det. By:  LMP  (04/02/18)          EDD:   01/07/19
Anatomy

 Cranium:               Appears normal         LVOT:                   Previously seen
 Cavum:                 Previously seen        Aortic Arch:            Appears normal
 Ventricles:            Previously seen        Ductal Arch:            Appears normal
 Choroid Plexus:        Appears normal         Diaphragm:              Previously seen
 Cerebellum:            Appears normal         Stomach:                Appears normal, left
                                                                       sided
 Posterior Fossa:       Previously seen        Abdomen:                Appears normal
 Nuchal Fold:           Previously seen        Abdominal Wall:         Previously seen
 Face:                  Orbits and profile     Cord Vessels:           Previously seen
                        previously seen
 Lips:                  Previously seen        Kidneys:                Appear normal
 Palate:                Previously seen        Bladder:                Appears normal
 Thoracic:              Appears normal         Spine:                  Previously seen
 Heart:                 Previously seen        Upper Extremities:      Previously seen
 RVOT:                  Previously seen        Lower Extremities:      Previously seen

 Other:  Heels and 5th digit previously visualized. Open hands previously
         visualized. Nasal bone previously visualized.
Cervix Uterus Adnexa

 Cervix
 Not visualized (advanced GA >28wks)
Impression

 Normal interval growth.
 Biophysical profile [DATE]
Recommendations

 Follow up biophysical profile scheduled in 1 week.

## 2019-02-16 ENCOUNTER — Encounter (HOSPITAL_BASED_OUTPATIENT_CLINIC_OR_DEPARTMENT_OTHER): Payer: Self-pay

## 2019-02-16 ENCOUNTER — Ambulatory Visit (HOSPITAL_BASED_OUTPATIENT_CLINIC_OR_DEPARTMENT_OTHER): Admit: 2019-02-16 | Payer: Medicare Other | Admitting: Obstetrics and Gynecology

## 2019-02-16 SURGERY — LIGATION, FALLOPIAN TUBE, LAPAROSCOPIC
Anesthesia: Choice | Laterality: Bilateral

## 2019-03-06 ENCOUNTER — Other Ambulatory Visit: Payer: Self-pay

## 2019-03-06 ENCOUNTER — Encounter (HOSPITAL_COMMUNITY): Payer: Self-pay

## 2019-03-06 ENCOUNTER — Emergency Department (HOSPITAL_COMMUNITY)
Admission: EM | Admit: 2019-03-06 | Discharge: 2019-03-06 | Disposition: A | Discharge status: Home / Self Care | Payer: Medicare Other | Attending: Emergency Medicine | Admitting: Emergency Medicine

## 2019-03-06 DIAGNOSIS — R5381 Other malaise: Secondary | ICD-10-CM | POA: Diagnosis not present

## 2019-03-06 DIAGNOSIS — R52 Pain, unspecified: Secondary | ICD-10-CM | POA: Diagnosis not present

## 2019-03-06 DIAGNOSIS — Z79899 Other long term (current) drug therapy: Secondary | ICD-10-CM | POA: Insufficient documentation

## 2019-03-06 DIAGNOSIS — F319 Bipolar disorder, unspecified: Secondary | ICD-10-CM | POA: Insufficient documentation

## 2019-03-06 DIAGNOSIS — R05 Cough: Secondary | ICD-10-CM | POA: Diagnosis present

## 2019-03-06 DIAGNOSIS — J069 Acute upper respiratory infection, unspecified: Secondary | ICD-10-CM | POA: Diagnosis not present

## 2019-03-06 DIAGNOSIS — R0981 Nasal congestion: Secondary | ICD-10-CM | POA: Insufficient documentation

## 2019-03-06 DIAGNOSIS — R509 Fever, unspecified: Secondary | ICD-10-CM | POA: Insufficient documentation

## 2019-03-06 DIAGNOSIS — R0602 Shortness of breath: Secondary | ICD-10-CM | POA: Diagnosis not present

## 2019-03-06 MED ORDER — ALBUTEROL SULFATE HFA 108 (90 BASE) MCG/ACT IN AERS
2.0000 | INHALATION_SPRAY | RESPIRATORY_TRACT | Status: DC | PRN
  Administered 2019-03-06: 2 via RESPIRATORY_TRACT
  Filled 2019-03-06: qty 6.7

## 2019-03-06 NOTE — ED Notes (Signed)
..  Patient verbalizes understanding of discharge instructions. Opportunity for questioning and answers were provided. Armband removed by staff, pt discharged from ED in cab with family.

## 2019-03-06 NOTE — ED Triage Notes (Signed)
Pt here for fever, cough and congestion, reports that her neighbors have a viral illness that she reports is COVID.

## 2019-03-06 NOTE — Discharge Instructions (Signed)
°If you live with, or provide care at home for, a person confirmed to have, or being evaluated for, COVID-19 infection °please follow these guidelines to prevent infection: ° °Follow healthcare provider’s instructions °Make sure that you understand and can help the patient follow any healthcare provider instructions for all care. ° °Provide for the patient’s basic needs °You should help the patient with basic needs in the home and provide support for getting groceries, prescriptions, and °other personal needs. ° °Monitor the patient’s symptoms °If they are getting sicker, call his or her medical provider a ° This will help the healthcare provider’s office take steps to keep other people from getting infected. °Ask the healthcare provider to call the local or state health department. ° °Limit the number of people who have contact with the patient °If possible, have only one caregiver for the patient. °Other household members should stay in another home or place of residence. If this is not possible, they should stay °in another room, or be separated from the patient as much as possible. Use a separate bathroom, if available. °Restrict visitors who do not have an essential need to be in the home. ° °Keep older adults, very young children, and other sick people away from the patient °Keep older adults, very young children, and those who have compromised immune systems or chronic health conditions away from the patient. This includes people with chronic heart, lung, or kidney conditions, diabetes, and cancer. ° °Ensure good ventilation °Make sure that shared spaces in the home have good air flow, such as from an air conditioner or an opened window, °weather permitting. ° °Wash your hands often °Wash your hands often and thoroughly with soap and water for at least 20 seconds. You can use an alcohol based hand sanitizer if soap and water are not available and if your hands are not visibly dirty. °Avoid touching your  eyes, nose, and mouth with unwashed hands. °Use disposable paper towels to dry your hands. If not available, use dedicated cloth towels and replace them when they become wet. ° °Wear a facemask and gloves °Wear a disposable facemask at all times in the room and gloves when you touch or have contact with the patient’s blood, body fluids, and/or secretions or excretions, such as sweat, saliva, sputum, nasal mucus, vomit, urine, or feces.  Ensure the mask fits over your nose and mouth tightly, and do not touch it during use. °Throw out disposable facemasks and gloves after using them. Do not reuse. °Wash your hands immediately after removing your facemask and gloves. °If your personal clothing becomes contaminated, carefully remove clothing and launder. Wash your hands after handling contaminated clothing. °Place all used disposable facemasks, gloves, and other waste in a lined container before disposing them with other household waste. °Remove gloves and wash your hands immediately after handling these items. ° °Do not share dishes, glasses, or other household items with the patient °Avoid sharing household items. You should not share dishes, drinking glasses, cups, eating utensils, towels, bedding, or other items °After the person uses these items, you should wash them thoroughly with soap and water. ° °Wash laundry thoroughly °Immediately remove and wash clothes or bedding that have blood, body fluids, and/or secretions or excretions, such as sweat, saliva, sputum, nasal mucus, vomit, urine, or feces, on them. °Wear gloves when handling laundry from the patient. °Read and follow directions on labels of laundry or clothing items and detergent. In general, wash and dry with the warmest temperatures recommended on the   label.  Clean all areas the individual has used often Clean all touchable surfaces, such as counters, tabletops, doorknobs, bathroom fixtures, toilets, phones, keyboards, tablets, and bedside tables,  every day. Also, clean any surfaces that may have blood, body fluids, and/or secretions or excretions on them. Wear gloves when cleaning surfaces the patient has come in contact with. Use a diluted bleach solution (e.g., dilute bleach with 1 part bleach and 10 parts water) or a household disinfectant with a label that says EPA-registered for coronaviruses. To make a bleach solution at home, add 1 tablespoon of bleach to 1 quart (4 cups) of water. For a larger supply, add  cup of bleach to 1 gallon (16 cups) of water. Read labels of cleaning products and follow recommendations provided on product labels. Labels contain instructions for safe and effective use of the cleaning product including precautions you should take when applying the product, such as wearing gloves or eye protection and making sure you have good ventilation during use of the product. Remove gloves and wash hands immediately after cleaning.  Monitor yourself for signs and symptoms of illness Caregivers and household members are considered close contacts, should monitor their health, and will be asked to limit movement outside of the home to the extent possible. Follow the monitoring steps for close contacts listed on the symptom monitoring form.   ?
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
## 2019-03-09 ENCOUNTER — Other Ambulatory Visit: Payer: Self-pay

## 2019-03-09 ENCOUNTER — Ambulatory Visit: Payer: Medicare Other | Admitting: Obstetrics and Gynecology

## 2019-03-09 NOTE — Patient Outreach (Signed)
[facility]) Care Management  03/09/2019  CECE MILHOUSE 05/07/1986 [id number]   Referral Date: 03/09/2019 Referral Source: [facility] referral Referral Reason:  Medication assistance, food resources, and transportation resources   Outreach Attempt: Discussed with patient.  She is able to verify HIPAA.  Discussed reason for referral. She states that she does not need medication assistance. She states she only pays about $3.00 for each medication.  Patient states that she needs help with obtaining [employer].  She states that her husband has his application but she needs help with one.  Patient also reports that there are 4 of them in the home and they need assistance with food.  Discussed [facility] services. She is agreeable to social work for resources.    Patient admits to asthma, Bipolar depression and anxiety.  She denies any problems with managing her illnesses but states she will be calling to get in the a new psychiatrist. She also states that she is going to get back in with her old PCP and denies needing CM assistance with that call.    Plan: RN CM will refer patient to social work for resources and sign off case.     Jone Baseman, RN, MSN [facility] Care Management Care Management Coordinator Direct Line [phone] Toll Free: [phone]  Fax: [phone]

## 2019-03-10 ENCOUNTER — Other Ambulatory Visit: Payer: Self-pay

## 2019-03-10 NOTE — Patient Outreach (Signed)
[facility]) Care Management  03/10/2019  JAYDAH STAHLE Jun 03, 1986 [id number]   Successful outreach to patient regarding social work referral for transportation and food resources.  Patient reports being able to utilize Medicaid transportation for MD appointments and using [employer], when money allows, for non-medical transportation. Patient requested that BSW submit application to [employer] but patient does not meet eligibility criteria for services.  BSW explained that eligibility is based on physical and/or cognitive deficits not financial deficits.  Patient reports having asthma but denies any mobility issues.  Patient also reported that public bus stop is very close to her home and denied having any challenges getting to bus stop. BSW offered to send [employer] and [employer] to patient for food resources, however, patient already has these.  She reported using [employer] pretty regularly but has not utilized many of the other available resources.  BSW offered to submit referral to the pantry at [place] as this would allow access to food once per month for six months.  BSW messaged Amy Marmion to determine if pantry is still open at this time or closed due to COVID-19 pandemic.  Patient inquired about clothing resources.  BSW emailed her list of resources in [place]. Patient asked if BSW had any information about eligibility for a car through [facility] for [facility].  BSW is familiar with the program but is not familiar with eligibility criteria.  Patient reports having contact information for this program. BSW also encouraged patient to call [place] [address] for various resources.  BSW will follow up with patient once response is received from [employer] about food pantry.   Ronn Melena, BSW Social Worker [phone]

## 2019-03-11 ENCOUNTER — Other Ambulatory Visit: Payer: Self-pay

## 2019-03-11 NOTE — Patient Outreach (Signed)
[facility]) Care Management  03/11/2019  PAELYN SMICK October 12, 1986 [id number]   Referral was submitted to pantry at [place].  This will allow patient use of the pantry every 30 days for six months.  BSW contacted patient to inform her of this and hours for pantry.  BSW also provided her with contact information for Amy Marmion, [employer], if needed.   BSW is closing case at this time.  Ronn Melena, BSW Social Worker [phone]

## 2019-03-21 ENCOUNTER — Ambulatory Visit: Payer: Medicare Other | Admitting: Obstetrics and Gynecology

## 2019-04-24 ENCOUNTER — Ambulatory Visit (HOSPITAL_COMMUNITY)
Admission: AD | Admit: 2019-04-24 | Discharge: 2019-04-24 | Disposition: A | Discharge status: Home / Self Care | Payer: Medicare Other | Attending: Psychiatry | Admitting: Psychiatry

## 2019-04-24 DIAGNOSIS — Z888 Allergy status to other drugs, medicaments and biological substances status: Secondary | ICD-10-CM | POA: Diagnosis not present

## 2019-04-24 DIAGNOSIS — Z800 Family history of malignant neoplasm of digestive organs: Secondary | ICD-10-CM | POA: Insufficient documentation

## 2019-04-24 DIAGNOSIS — K219 Gastro-esophageal reflux disease without esophagitis: Secondary | ICD-10-CM | POA: Diagnosis not present

## 2019-04-24 DIAGNOSIS — J45909 Unspecified asthma, uncomplicated: Secondary | ICD-10-CM | POA: Diagnosis not present

## 2019-04-24 DIAGNOSIS — F3130 Bipolar disorder, current episode depressed, mild or moderate severity, unspecified: Secondary | ICD-10-CM | POA: Diagnosis present

## 2019-04-24 DIAGNOSIS — Z818 Family history of other mental and behavioral disorders: Secondary | ICD-10-CM | POA: Insufficient documentation

## 2019-04-24 NOTE — H&P (Signed)
Behavioral Health Medical Screening Exam  [name] is an 33 y.o. female. Pt is a 33 year old female who presented to [facility] with [employer] after having an argument with her husband and saying she wanted to hurt herself. Pt stated he was going to come to the hospital today because he has not been taking his medications and she did not want him to go because it is Mothers Day. The police also brought her husband to [facility] for an evaluation. Pt stated her husband sees an ACTT team with [facility] and he has an appointment Tuesday. She also goes to [facility] for her medications and therapy. She has a history of Bipolar Disorder. They have 2 small children and she stated she would never hurt herself because she has kids. Pt is psychiatrically clear.   Total Time spent with patient: 30 minutes  Psychiatric Specialty Exam: Physical Exam  Constitutional: She is oriented to person, place, and time. She appears well-developed and well-nourished.  HENT:  Head: Normocephalic.  Respiratory: Effort normal.  Musculoskeletal: Normal range of motion.  Neurological: She is alert and oriented to person, place, and time.  Psychiatric: Her speech is normal and behavior is normal. Judgment and thought content normal. Her mood appears anxious. Cognition and memory are normal.    ROS  unknown if currently breastfeeding.There is no height or weight on file to calculate BMI.  General Appearance: Casual  Eye Contact:  Good  Speech:  Clear and Coherent and Normal Rate  Volume:  Normal  Mood:  Anxious  Affect:  Congruent  Thought Process:  Coherent, Goal Directed and Descriptions of Associations: Intact  Orientation:  Full (Time, Place, and Person)  Thought Content:  Logical  Suicidal Thoughts:  No  Homicidal Thoughts:  No  Memory:  Immediate;   Good Recent;   Good Remote;   Fair  Judgement:  Fair  Insight:  Fair  Psychomotor Activity:  Normal  Concentration: Concentration: Good and Attention Span: Good  Recall:   Good  Fund of Knowledge:Good  Language: Good  Akathisia:  Negative  Handed:  Right  AIMS (if indicated):     Assets:  Agricultural consultant Housing Intimacy Physical Health Social Support Transportation  Sleep:       Musculoskeletal: Strength & Muscle Tone: within normal limits Gait & Station: normal Patient leans: N/A  unknown if currently breastfeeding.  Recommendations:  Based on my evaluation the patient does not appear to have an emergency medical condition. Pt to follow up with [facility] for medication management and therapy.   Ethelene Hal, NP 04/24/2019, 6:35 PM
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
## 2019-05-04 ENCOUNTER — Telehealth: Payer: Self-pay

## 2019-05-04 NOTE — Telephone Encounter (Signed)
Pt called and states that she thinks she may be pregnant. She reports the home UPT's she has taken come up with a faint line for positive, but she isn't sure if they are accurate. Pt reports LMP 04/05/19. Pt last depo was in 12/2018. I advised pt that she can come in for a upt nurse visit, pt verbalizes understanding and call transferred up front for pt to make appt.

## 2019-05-19 ENCOUNTER — Telehealth: Payer: Self-pay | Admitting: Family Medicine

## 2019-05-19 NOTE — Telephone Encounter (Signed)
Patient called to make an appointment. I told her since she was a patient in our [place] office, she would need to continue her care there. She stated she did not want to go there because the people that work in the front were rude.

## 2019-05-21 ENCOUNTER — Other Ambulatory Visit: Payer: Self-pay

## 2019-05-21 ENCOUNTER — Inpatient Hospital Stay (HOSPITAL_COMMUNITY): Payer: Medicare Other

## 2019-05-21 ENCOUNTER — Inpatient Hospital Stay (HOSPITAL_COMMUNITY)
Admission: AD | Admit: 2019-05-21 | Discharge: 2019-05-21 | Disposition: A | Discharge status: Home / Self Care | Payer: Medicare Other | Attending: Obstetrics and Gynecology | Admitting: Obstetrics and Gynecology

## 2019-05-21 ENCOUNTER — Encounter (HOSPITAL_COMMUNITY): Payer: Self-pay | Admitting: Nurse Practitioner

## 2019-05-21 DIAGNOSIS — O3680X0 Pregnancy with inconclusive fetal viability, not applicable or unspecified: Secondary | ICD-10-CM | POA: Diagnosis not present

## 2019-05-21 DIAGNOSIS — Z3A01 Less than 8 weeks gestation of pregnancy: Secondary | ICD-10-CM | POA: Diagnosis not present

## 2019-05-21 DIAGNOSIS — O4691 Antepartum hemorrhage, unspecified, first trimester: Secondary | ICD-10-CM

## 2019-05-21 DIAGNOSIS — R1084 Generalized abdominal pain: Secondary | ICD-10-CM | POA: Diagnosis not present

## 2019-05-21 DIAGNOSIS — O209 Hemorrhage in early pregnancy, unspecified: Secondary | ICD-10-CM | POA: Insufficient documentation

## 2019-05-21 DIAGNOSIS — Z6790 Unspecified blood type, Rh positive: Secondary | ICD-10-CM | POA: Diagnosis not present

## 2019-05-21 DIAGNOSIS — O4690 Antepartum hemorrhage, unspecified, unspecified trimester: Secondary | ICD-10-CM

## 2019-05-21 DIAGNOSIS — R1030 Lower abdominal pain, unspecified: Secondary | ICD-10-CM | POA: Diagnosis not present

## 2019-05-21 DIAGNOSIS — R109 Unspecified abdominal pain: Secondary | ICD-10-CM | POA: Diagnosis present

## 2019-05-21 LAB — CBC
HCT: 38.4 % (ref 36.0–46.0)
Hemoglobin: 12.4 g/dL (ref 12.0–15.0)
MCH: 30.1 pg (ref 26.0–34.0)
MCHC: 32.3 g/dL (ref 30.0–36.0)
MCV: 93.2 fL (ref 80.0–100.0)
Platelets: 160 10*3/uL (ref 150–400)
RBC: 4.12 MIL/uL (ref 3.87–5.11)
RDW: 14.8 % (ref 11.5–15.5)
WBC: 6.8 10*3/uL (ref 4.0–10.5)
nRBC: 0 % (ref 0.0–0.2)

## 2019-05-21 LAB — URINALYSIS, ROUTINE W REFLEX MICROSCOPIC
Bilirubin Urine: NEGATIVE
Glucose, UA: NEGATIVE mg/dL
Hgb urine dipstick: NEGATIVE
Ketones, ur: NEGATIVE mg/dL
Nitrite: NEGATIVE
Protein, ur: NEGATIVE mg/dL
Specific Gravity, Urine: 1.026 (ref 1.005–1.030)
pH: 5 (ref 5.0–8.0)

## 2019-05-21 LAB — POCT PREGNANCY, URINE: Preg Test, Ur: POSITIVE — AB

## 2019-05-21 LAB — HCG, QUANTITATIVE, PREGNANCY: hCG, Beta Chain, Quant, S: 154 m[IU]/mL — ABNORMAL HIGH (ref <5)

## 2019-05-21 NOTE — MAU Provider Note (Signed)
History     CSN: [id number]  Arrival date and time: 05/21/19 1742   First Provider Initiated Contact with Patient 05/21/19 1853      Chief Complaint  Patient presents with  . Abdominal Pain  . Vaginal Bleeding   [name] is a 33 y.o. [phone] at [redacted]w[redacted]d who presents to MAU for vaginal bleeding which began this morning around 0900, intermittent for 1hr. Pt denies pain and bleeding at this time.  Passing blood clots? no Blood soaking clothes? no Lightheaded/dizzy? no Significant pelvic pain or cramping? no Passed any tissue? no Hx ectopic pregnancy? no Current pregnancy problems? has not yet been seen Blood Type? O Positive Allergies? See list in computer Current medications? See chart for current list Current [facility] & next appt? [facility], Thursday 05/26/2019  Pt denies vaginal discharge/odor/itching. Pt denies N/V, abdominal pain, constipation, diarrhea, or urinary problems. Pt denies fever, chills, fatigue, sweating or changes in appetite. Pt denies SOB or chest pain. Pt denies dizziness, HA, light-headedness, weakness.  In discussing plan of care with patient, pt declines pelvic exam and vaginal swabs citing discomfort of exams. Discussed with pt that I will not be able to fully evaluate her concerns today without these examinations and will be unable to offer her a complete evaluation. Pt continues to decline exam and testing even after in-depth discussion with provider, but does accept abdominal exam, blood work and [place] (including [place] with transvaginal probe). Pt concerned about estimated time to spend in MAU for remainder of exams.   OB History    Gravida  5   Para  3   Term  2   Preterm  1   AB  0   Living  3     SAB  0   TAB      Ectopic      Multiple  0   Live Births  3           Past Medical History:  Diagnosis Date  . Anxiety   . Asthma    inhaler PRN-hasn't used inhaler in months  . Attention deficit hyperactivity disorder   . Bipolar  disorder [facility])    hospitalized as a teen for suicidal ideations and cutting  . Depression    no complaints, doing well  . Gastritis   . GERD (gastroesophageal reflux disease)   . HPV (human papilloma virus) anogenital infection   . Hx of varicella   . Mild preeclampsia 02/09/2016  . Pregnancy induced hypertension   . Vaginal Pap smear, abnormal     Past Surgical History:  Procedure Laterality Date  . EYE MUSCLE SURGERY Bilateral    07/29/12  . MOUTH SURGERY    . PLANTAR FASCIA SURGERY    . WISDOM TOOTH EXTRACTION      Family History  Problem Relation Age of Onset  . Depression Mother   . Colon cancer Mother   . Colon polyps Mother   . Depression Father   . Depression Brother   . Liver disease Other   . Kidney disease Other     Social History   Tobacco Use  . Smoking status: Never Smoker  . Smokeless tobacco: Never Used  Substance Use Topics  . Alcohol use: No    Alcohol/week: 0.0 standard drinks  . Drug use: No    Allergies:  Allergies  Allergen Reactions  . Depakote [Divalproex Sodium] Other (See Comments)    Pt states that this medication makes her blood levels toxic.    [name]  Methylphenidate Derivatives Other (See Comments)    Reaction:  Depression and anger   . Neurontin [Gabapentin] Other (See Comments)    Reaction:  Dizziness   . Prozac [Fluoxetine Hcl] Other (See Comments)    Reaction:  Anger     Medications Prior to Admission  Medication Sig Dispense Refill Last Dose  . ARIPiprazole (ABILIFY) 15 MG tablet Take 1 tablet (15 mg total) by mouth daily. 30 tablet 2 05/21/2019 at Unknown time  . busPIRone (BUSPAR) 10 MG tablet Take 1 tablet (10 mg total) by mouth 2 (two) times daily. 60 tablet 2 05/21/2019 at Unknown time  . Prenatal Vit-Fe Fumarate-FA (PRENATAL MULTIVITAMIN) TABS tablet Take 1 tablet by mouth at bedtime. 30 tablet 11 05/20/2019 at Unknown time    Review of Systems  Constitutional: Negative for chills, diaphoresis, fatigue and fever.   Respiratory: Negative for shortness of breath.   Cardiovascular: Negative for chest pain.  Gastrointestinal: Negative for abdominal pain, constipation, diarrhea, nausea and vomiting.  Genitourinary: Positive for vaginal bleeding. Negative for dysuria, flank pain, frequency, pelvic pain, urgency and vaginal discharge.  Musculoskeletal: Negative for back pain.  Neurological: Negative for dizziness, weakness, light-headedness and headaches.   Physical Exam   Blood pressure 116/60, pulse 88, temperature 98.5 F (36.9 C), temperature source Oral, resp. rate 18, height 5\' 3"  (1.6 m), last menstrual period 04/05/2019, SpO2 99 %, unknown if currently breastfeeding.  Patient Vitals for the past 24 hrs:  BP Temp Temp src Pulse Resp SpO2 Height Weight  05/21/19 2108 -- 98.5 F (36.9 C) Oral 88 18 99 % -- --  05/21/19 1805 116/60 -- Oral 93 18 100 % 5\' 3"  (1.6 m) --   Physical Exam  Constitutional: She is oriented to person, place, and time. She appears well-developed and well-nourished. No distress.  HENT:  Head: Normocephalic and atraumatic.  Respiratory: Effort normal.  GI: Soft. She exhibits no distension and no mass. There is no abdominal tenderness. There is no rebound and no guarding.  Genitourinary:    Genitourinary Comments: Pelvic exam and vaginal swabs declined.   Neurological: She is alert and oriented to person, place, and time.  Skin: Skin is warm and dry. She is not diaphoretic.  Psychiatric: She has a normal mood and affect. Her behavior is normal. Judgment and thought content normal.   Results for orders placed or performed during the hospital encounter of 05/21/19 (from the past 24 hour(s))  Urinalysis, Routine w reflex microscopic     Status: Abnormal   Collection Time: 05/21/19  5:55 PM  Result Value Ref Range   Color, Urine YELLOW YELLOW   APPearance HAZY (A) CLEAR   Specific Gravity, Urine 1.026 1.005 - 1.030   pH 5.0 5.0 - 8.0   Glucose, UA NEGATIVE NEGATIVE mg/dL    Hgb urine dipstick NEGATIVE NEGATIVE   Bilirubin Urine NEGATIVE NEGATIVE   Ketones, ur NEGATIVE NEGATIVE mg/dL   Protein, ur NEGATIVE NEGATIVE mg/dL   Nitrite NEGATIVE NEGATIVE   Leukocytes,Ua SMALL (A) NEGATIVE   RBC / HPF 0-5 0 - 5 RBC/hpf   WBC, UA 0-5 0 - 5 WBC/hpf   Bacteria, UA RARE (A) NONE SEEN   Squamous Epithelial / LPF 11-20 0 - 5   Mucus PRESENT   Pregnancy, urine POC     Status: Abnormal   Collection Time: 05/21/19  5:59 PM  Result Value Ref Range   Preg Test, Ur POSITIVE (A) NEGATIVE  CBC     Status: None   Collection  Time: 05/21/19  6:18 PM  Result Value Ref Range   WBC 6.8 4.0 - 10.5 K/uL   RBC 4.12 3.87 - 5.11 MIL/uL   Hemoglobin 12.4 12.0 - 15.0 g/dL   HCT 38.4 36.0 - 46.0 %   MCV 93.2 80.0 - 100.0 fL   MCH 30.1 26.0 - 34.0 pg   MCHC 32.3 30.0 - 36.0 g/dL   RDW 14.8 11.5 - 15.5 %   Platelets 160 150 - 400 K/uL   nRBC 0.0 0.0 - 0.2 %  hCG, quantitative, pregnancy     Status: Abnormal   Collection Time: 05/21/19  6:18 PM  Result Value Ref Range   hCG, Beta Chain, Quant, S 154 (H) <5 mIU/mL   US Ob Less Than 14 Weeks With Ob Transvaginal  Result Date: 05/21/2019 CLINICAL DATA:  Vaginal spotting. Positive urine pregnancy test. Quantitative beta HCG 154. Estimated gestational age per LMP 6 weeks 4 days. EXAM: OBSTETRIC <14 WK [place] AND TRANSVAGINAL OB US TECHNIQUE: Both transabdominal and transvaginal ultrasound examinations were performed for complete evaluation of the gestation as well as the maternal uterus, adnexal regions, and pelvic cul-de-sac. Transvaginal technique was performed to assess early pregnancy. COMPARISON:  None. FINDINGS: Intrauterine gestational sac: Not visualized. Yolk sac:  Not visualized. Embryo:  Not visualized. Cardiac Activity: Not visualized. Heart Rate: Not visualized. MSD: None visualized. Subchorionic hemorrhage:  None visualized. Maternal uterus/adnexae: Right ovary is normal. Minimally complicated left ovarian cyst likely corpus luteal  cyst. Trace free fluid. IMPRESSION: No evidence of intrauterine gestational sac. No suspicious findings to suggest ectopic pregnancy. Findings likely due to normal early pregnancy as recommend serial quantitative beta hCG and follow-up ultrasound 2 weeks. Electronically Signed   By: Marin Olp M.D.   On: 05/21/2019 20:55    MAU Course  Procedures  MDM -r/o ectopic -pelvic exam declined -vaginal swabs declined -UA: hazy/sm leuks/rare bacteria, sending urine for culture -CBC: WNL -[place]: no yolk sac, embryo, or FHB, likely corpus luteal cyst in left ovary -hCG: 154 -ABO: O Positive -pregnancy of unknown location -pt discharged to home in stable condition -of note, pt refused discharge BP by nurse  Orders Placed This Encounter  Procedures  . Culture, OB Urine    Standing Status:   Standing    Number of Occurrences:   1  . US OB LESS THAN 14 WEEKS WITH OB TRANSVAGINAL    Standing Status:   Standing    Number of Occurrences:   1    Order Specific Question:   Symptom/Reason for Exam    Answer:   Vaginal bleeding in pregnancy [[id number]]  . Urinalysis, Routine w reflex microscopic    Standing Status:   Standing    Number of Occurrences:   1  . CBC    Standing Status:   Standing    Number of Occurrences:   1  . hCG, quantitative, pregnancy    Standing Status:   Standing    Number of Occurrences:   1  . Pregnancy, urine POC    Standing Status:   Standing    Number of Occurrences:   1  . Discharge patient    Order Specific Question:   Discharge disposition    Answer:   01-Home or Self Care [1]    Order Specific Question:   Discharge patient date    Answer:   05/21/2019   No orders of the defined types were placed in this encounter.  Assessment and Plan   1. Pregnancy of  unknown anatomic location   2. Vaginal bleeding in pregnancy   3. Blood type, Rh positive    Allergies as of 05/21/2019      Reactions   Depakote [divalproex Sodium] Other (See Comments)   Pt states that this  medication makes her blood levels toxic.     Methylphenidate Derivatives Other (See Comments)   Reaction:  Depression and anger    Neurontin [gabapentin] Other (See Comments)   Reaction:  Dizziness    Prozac [fluoxetine Hcl] Other (See Comments)   Reaction:  Anger       Medication List    TAKE these medications   ARIPiprazole 15 MG tablet Commonly known as:  ABILIFY Take 1 tablet (15 mg total) by mouth daily.   busPIRone 10 MG tablet Commonly known as:  BUSPAR Take 1 tablet (10 mg total) by mouth 2 (two) times daily.   prenatal multivitamin Tabs tablet Take 1 tablet by mouth at bedtime.      -will call for culture results, if positive -pt scheduled for appt @[facility]  for stat repeat hCG for Tuesday 05/24/2019 @1330  - pt was seen in MAU after 1400 on 05/21/2019 -pt instructed on impt of keeping above appt as scheduled, pt to call clinic if needing to change appt to discuss with provider and informed that blood must be taken on Tuesday for appropriate evaluation of pregnancy -strict ectopic/return MAU precautions given -pt discharged to home in stable condition  Elmyra Ricks E Hildreth Orsak 05/21/2019, 9:11 PM

## 2019-05-21 NOTE — MAU Note (Signed)
Pt reports she took a home pregnancy test 2 weeks ago that was positive. LMP 04/05/2019, She was on Depo provera per EMT report, she has been spotting for the past 2 hours and has lower abdominal pain. She is currently not in pain. Denies vaginal discharge.

## 2019-05-21 NOTE — MAU Note (Signed)
Patient refused discharge BP. Patient stated " I don't want you to take my BP because it hurts too much." Vernice Jefferson, NP made aware.

## 2019-05-21 NOTE — Discharge Instructions (Signed)
Vaginal Bleeding During Pregnancy, First Trimester  A small amount of bleeding from the vagina (spotting) is relatively common during early pregnancy. It usually stops on its own. Various things may cause bleeding or spotting during early pregnancy. Some bleeding may be related to the pregnancy, and some may not. In many cases, the bleeding is normal and is not a problem. However, bleeding can also be a sign of something serious. Be sure to tell your health care provider about any vaginal bleeding right away. Some possible causes of vaginal bleeding during the first trimester include:  Infection or inflammation of the cervix.  Growths (polyps) on the cervix.  Miscarriage or threatened miscarriage.  Pregnancy tissue developing outside of the uterus (ectopic pregnancy).  A mass of tissue developing in the uterus due to an egg being fertilized incorrectly (molar pregnancy). Follow these instructions at home: Activity  Follow instructions from your health care provider about limiting your activity. Ask what activities are safe for you.  If needed, make plans for someone to help with your regular activities.  Do not have sex or orgasms until your health care provider says that this is safe. General instructions  Take over-the-counter and prescription medicines only as told by your health care provider.  Pay attention to any changes in your symptoms.  Do not use tampons or douche.  Write down how many pads you use each day, how often you change pads, and how soaked (saturated) they are.  If you pass any tissue from your vagina, save the tissue so you can show it to your health care provider.  Keep all follow-up visits as told by your health care provider. This is important. Contact a health care provider if:  You have vaginal bleeding during any part of your pregnancy.  You have cramps or labor pains.  You have a fever. Get help right away if:  You have severe cramps in your  back or abdomen.  You pass large clots or a large amount of tissue from your vagina.  Your bleeding increases.  You feel light-headed or weak, or you faint.  You have chills.  You are leaking fluid or have a gush of fluid from your vagina. Summary  A small amount of bleeding (spotting) from the vagina is relatively common during early pregnancy.  Various things may cause bleeding or spotting in early pregnancy.  Be sure to tell your health care provider about any vaginal bleeding right away. This information is not intended to replace advice given to you by your health care provider. Make sure you discuss any questions you have with your health care provider. Document Released: 09/10/2005 Document Revised: 03/05/2017 Document Reviewed: 03/05/2017 Elsevier Interactive Patient Education  2019 [place]. Ectopic Pregnancy  An ectopic pregnancy is when the fertilized egg attaches (implants) outside the uterus. Most ectopic pregnancies occur in one of the tubes where eggs travel from the ovary to the uterus (fallopian tubes), but the implanting can occur in other locations. In rare cases, ectopic pregnancies occur on the ovary, intestine, pelvis, abdomen, or cervix. In an ectopic pregnancy, the fertilized egg does not have the ability to develop into a normal, healthy baby. A ruptured ectopic pregnancy is one in which tearing or bursting of a fallopian tube causes internal bleeding. Often, there is intense lower abdominal pain, and vaginal bleeding sometimes occurs. Having an ectopic pregnancy can be life-threatening. If this dangerous condition is not treated, it can lead to blood loss, shock, or even death. What are  the causes? The most common cause of this condition is damage to one of the fallopian tubes. A fallopian tube may be narrowed or blocked, and that keeps the fertilized egg from reaching the uterus. What increases the risk? This condition is more likely to develop in women of  childbearing age who have different levels of risk. The levels of risk can be divided into three categories. High risk  You have gone through infertility treatment.  You have had an ectopic pregnancy before.  You have had surgery on the fallopian tubes, or another surgical procedure, such as an abortion.  You have had surgery to have the fallopian tubes tied (tubal ligation).  You have problems or diseases of the fallopian tubes.  You have been exposed to diethylstilbestrol (DES). This medicine was used until 1971, and it had effects on babies whose mothers took the medicine.  You become pregnant while using an IUD (intrauterine device) for birth control. Moderate risk  You have a history of infertility.  You have had an STI (sexually transmitted infection).  You have a history of pelvic inflammatory disease (PID).  You have scarring from endometriosis.  You have multiple sexual partners.  You smoke. Low risk  You have had pelvic surgery.  You use vaginal douches.  You became sexually active before age 22. What are the signs or symptoms? Common symptoms of this condition include normal pregnancy symptoms, such as missing a period, nausea, tiredness, abdominal pain, breast tenderness, and bleeding. However, ectopic pregnancy will have additional symptoms, such as:  Pain with intercourse.  Irregular vaginal bleeding or spotting.  Cramping or pain on one side or in the lower abdomen.  Fast heartbeat, low blood pressure, and sweating.  Passing out while having a bowel movement. Symptoms of a ruptured ectopic pregnancy and internal bleeding may include:  Sudden, severe pain in the abdomen and pelvis.  Dizziness, weakness, light-headedness, or fainting.  Pain in the shoulder or neck area. How is this diagnosed? This condition is diagnosed by:  A pelvic exam to locate pain or a mass in the abdomen.  A pregnancy test. This blood test checks for the presence as well  as the specific level of pregnancy hormone in the bloodstream.  Ultrasound. This is performed if a pregnancy test is positive. In this test, a probe is inserted into the vagina. The probe will detect a fetus, possibly in a location other than the uterus.  Taking a sample of uterus tissue (dilation and curettage, or D&C).  Surgery to perform a visual exam of the inside of the abdomen using a thin, lighted tube that has a tiny camera on the end (laparoscope).  Culdocentesis. This procedure involves inserting a needle at the top of the vagina, behind the uterus. If blood is present in this area, it may indicate that a fallopian tube is torn. How is this treated? This condition is treated with medicine or surgery. Medicine  An injection of a medicine (methotrexate) may be given to cause the pregnancy tissue to be absorbed. This medicine may save your fallopian tube. It may be given if: ? The diagnosis is made early, with no signs of active bleeding. ? The fallopian tube has not ruptured. ? You are considered to be a good candidate for the medicine. Usually, pregnancy hormone blood levels are checked after methotrexate treatment. This is to be sure that the medicine is effective. It may take 4-6 weeks for the pregnancy to be absorbed. Most pregnancies will be absorbed by  3 weeks. Surgery  A laparoscope may be used to remove the pregnancy tissue.  If severe internal bleeding occurs, a larger cut (incision) may be made in the lower abdomen (laparotomy) to remove the fetus and placenta. This is done to stop the bleeding.  Part or all of the fallopian tube may be removed (salpingectomy) along with the fetus and placenta. The fallopian tube may also be repaired during the surgery.  In very rare circumstances, removal of the uterus (hysterectomy) may be required.  After surgery, pregnancy hormone testing may be done to be sure that there is no pregnancy tissue left. Whether your treatment is  medicine or surgery, you may receive a Rho (D) immune globulin shot to prevent problems with any future pregnancy. This shot may be given if:  You are Rh-negative and the baby's father is Rh-positive.  You are Rh-negative and you do not know the Rh type of the baby's father. Follow these instructions at home:  Rest and limit your activity after the procedure for as long as told by your health care provider.  Until your health care provider says that it is safe: ? Do not lift anything that is heavier than 10 lb (4.5 kg), or the limit that your health care provider tells you. ? Avoid physical exercise and any movement that requires effort (is strenuous).  To help prevent constipation: ? Eat a healthy diet that includes fruits, vegetables, and whole grains. ? Drink 6-8 glasses of water per day. Get help right away if:  You develop worsening pain that is not relieved by medicine.  You have: ? A fever or chills. ? Vaginal bleeding. ? Redness and swelling at the incision site. ? Nausea and vomiting.  You feel dizzy or weak.  You feel light-headed or you faint. This information is not intended to replace advice given to you by your health care provider. Make sure you discuss any questions you have with your health care provider. Document Released: 01/08/2005 Document Revised: 07/30/2016 Document Reviewed: 07/02/2016 Elsevier Interactive Patient Education  [employer].

## 2019-05-23 ENCOUNTER — Telehealth: Payer: Self-pay

## 2019-05-23 LAB — CULTURE, OB URINE: Culture: 50000 — AB

## 2019-05-23 NOTE — Telephone Encounter (Signed)
The patient stated she visited the ER and was told her HCG level is 156. Does that mean that is defiantly pregrant and she keeps testing positive on a pregnancy test. She would like a call back to discuss.

## 2019-05-23 NOTE — Telephone Encounter (Signed)
Attempted to return pt's call.  Pt did not pick up.  Left voicemail advising pt that she was being contacted regarding her call and requesting that she contact the clinic if she continues to have questions or concerns.

## 2019-05-24 ENCOUNTER — Telehealth: Payer: Self-pay | Admitting: Women's Health

## 2019-05-24 ENCOUNTER — Telehealth: Payer: Self-pay | Admitting: Family Medicine

## 2019-05-24 ENCOUNTER — Other Ambulatory Visit: Payer: Medicare Other

## 2019-05-24 ENCOUNTER — Other Ambulatory Visit: Payer: Self-pay

## 2019-05-24 ENCOUNTER — Telehealth (INDEPENDENT_AMBULATORY_CARE_PROVIDER_SITE_OTHER): Payer: Medicare Other | Admitting: *Deleted

## 2019-05-24 ENCOUNTER — Telehealth: Payer: Medicare Other | Admitting: Women's Health

## 2019-05-24 DIAGNOSIS — O3680X0 Pregnancy with inconclusive fetal viability, not applicable or unspecified: Secondary | ICD-10-CM

## 2019-05-24 DIAGNOSIS — O4690 Antepartum hemorrhage, unspecified, unspecified trimester: Secondary | ICD-10-CM

## 2019-05-24 LAB — BETA HCG QUANT (REF LAB): hCG Quant: 105 m[IU]/mL

## 2019-05-24 NOTE — Telephone Encounter (Signed)
Patient is calling about her results.

## 2019-05-24 NOTE — Telephone Encounter (Signed)
Pt called the clinic requesting further information following her discussion with the provider about her STAT beta results.  Pt stated she had previously been on depo provera for contraception and wanted to know if that would lower the level falsely.  Advised pt that it would not impact her hormone levels.  Pt asked if it was possible for her to have a perfectly healthy pregnancy following this drop in level.  Advised her that it is not likely that this will be a normally developing pregnancy.  Pt asked if we know where the pregnancy is.  Advised pt that we do not know where the pregnancy is, which is why she was told to go to [facility] if she develops any s/s of ectopic pregnancy and why we are having her come back on Thursday 6/11 @ 1300.  Pt asked when she could get pregnant again.  Advised pt that she could discuss that with a provider at a future date but that at this time we are going to see how her bhcg is on Thursday.  Pt asked what we would do if the lab went up.  Advised pt that the provider would be consulted and plan of care decided at that time.  Pt verbalized understanding

## 2019-05-24 NOTE — Telephone Encounter (Signed)
Pt called RN for stat hCG results. RN identified pt by two identifiers. Provider relayed stat hCG results to pt, pt was advised this is not likely a normally developing pregnancy, and could be either a miscarriage or an ectopic pregnancy. Pt questions asked and answered extensively. Pt advised to return to [facility] clinic for repeat hCG on Thursday afternoon. Pt aware of and agrees with plan. Pt upset regarding lower hCG results. Pt denies pain or bleeding. Discussed SAB/bleeding/pain/return [facility] precautions.  Clarisa Fling, NP  4:16 PM 05/24/2019

## 2019-05-24 NOTE — Progress Notes (Signed)
Pt presents to clinic for STAT bhcg for pregnancy of unknown location.  Pt denies vaginal bleeding or abdominal pain.  Discussed with pt why the lab was being drawn with Derinda Late RN present to answer questions also.  Pt informed that the lab takes 1-2 hours to result and we would contact her with those results and a plan of care.  Pt verbalized understanding and stated that she could be contacted at [phone].  STAT bhcg result was 105.  On 05/21/19 bhcg was 154.  Reviewed with Vernice Jefferson NP who will call pt and discuss.

## 2019-05-25 ENCOUNTER — Other Ambulatory Visit: Payer: Self-pay

## 2019-05-25 ENCOUNTER — Encounter (HOSPITAL_COMMUNITY): Payer: Self-pay

## 2019-05-25 ENCOUNTER — Emergency Department (HOSPITAL_COMMUNITY)
Admission: EM | Admit: 2019-05-25 | Discharge: 2019-05-25 | Discharge status: Against Medical Advice | Payer: Medicare Other | Attending: Emergency Medicine | Admitting: Emergency Medicine

## 2019-05-25 DIAGNOSIS — M545 Low back pain: Secondary | ICD-10-CM | POA: Diagnosis present

## 2019-05-25 DIAGNOSIS — Z5321 Procedure and treatment not carried out due to patient leaving prior to being seen by health care provider: Secondary | ICD-10-CM | POA: Diagnosis not present

## 2019-05-25 DIAGNOSIS — O039 Complete or unspecified spontaneous abortion without complication: Secondary | ICD-10-CM | POA: Diagnosis not present

## 2019-05-25 NOTE — ED Triage Notes (Signed)
Onset last night pt fell out of bed.  C/o of back pain.  Pt here today requesting Hcg test, is concerned she is having a miscarriage.  No vaginal bleeding or abd pain.   Called [facility] per Apolonio Schneiders RN and Dr. Burney Gauze it is ok for patient to wait until next appt on Monday 05-30-19 to have this lab checked.  Give return precautions to patient.  Return to [facility] for bleeding/pain.   Pt insists on being seen since she is having back pain since falling out of bed last night.   Called [facility] per Apolonio Schneiders pt can be seen in ED for back pain from fall off of bed.

## 2019-05-25 NOTE — ED Provider Notes (Signed)
1259: Went to see patient. RN notified me she had left prior to my evaluation. Ambulatory out of ER without difficulty by RN.  I did not examine or speak to patient.    Kinnie Feil, PA-C 05/25/19 1259    Daleen Bo, MD 05/27/19 2212

## 2019-05-26 ENCOUNTER — Ambulatory Visit: Payer: Medicare Other

## 2019-05-26 ENCOUNTER — Inpatient Hospital Stay (HOSPITAL_COMMUNITY)
Admission: AD | Admit: 2019-05-26 | Discharge: 2019-05-26 | Disposition: A | Discharge status: Home / Self Care | Payer: Medicare Other | Attending: Obstetrics & Gynecology | Admitting: Obstetrics & Gynecology

## 2019-05-26 ENCOUNTER — Other Ambulatory Visit: Payer: Self-pay

## 2019-05-26 DIAGNOSIS — O039 Complete or unspecified spontaneous abortion without complication: Secondary | ICD-10-CM | POA: Diagnosis present

## 2019-05-26 DIAGNOSIS — M5489 Other dorsalgia: Secondary | ICD-10-CM | POA: Diagnosis not present

## 2019-05-26 DIAGNOSIS — R58 Hemorrhage, not elsewhere classified: Secondary | ICD-10-CM | POA: Diagnosis not present

## 2019-05-26 LAB — HCG, QUANTITATIVE, PREGNANCY: hCG, Beta Chain, Quant, S: 45 m[IU]/mL — ABNORMAL HIGH (ref <5)

## 2019-05-26 NOTE — [facility] Note (Signed)
labwork drawn, pt leaving- call with results

## 2019-05-26 NOTE — [facility] Provider Note (Signed)
[name]  is a 33 y.o. [phone]  at [redacted]w[redacted]d who presents to [facility] via EMS today for follow-up quant hCG. Patient was supposed to be in the office today for her HCG but was concerned b/c she's continued to have spotting & knows the hormone level had previously dropped. States spotting is not heavier than when she was seen in [facility] previously; is not saturating pads or passing clots. Denies abdominal pain.   LMP 04/05/2019   GENERAL: Well-developed, well-nourished female in no acute distress.  HEENT: Normocephalic, atraumatic.   LUNGS: Effort normal HEART: Regular rate  SKIN: Warm, dry and without erythema PSYCH: Normal mood and affect   A: 1. Miscarriage   -HCG as dropped >50% consistent with miscarriage   (154>105>45) -RH positive   P: Discharge home Msg to [facility] for SAB follow up   Jorje Guild, NP  05/26/2019 9:20 AM

## 2019-05-26 NOTE — [facility] Note (Signed)
Might be having a miscarriage.  Was spotting earlier this morning, noted when wiped x1- did not see when last voided..  Was to go to clinic today for repeat BHCG.  Some back pain, "normal for her, had before pregnant.".  Is concerned as levels had dropped.

## 2019-05-26 NOTE — Discharge Instructions (Signed)
Miscarriage °A miscarriage is the loss of an unborn baby (fetus) before the 20th week of pregnancy. Most miscarriages happen during the first 3 months of pregnancy. Sometimes, a miscarriage can happen before a woman knows that she is pregnant. °Having a miscarriage can be an emotional experience. If you have had a miscarriage, talk with your health care provider about any questions you may have about miscarrying, the grieving process, and your plans for future pregnancy. °What are the causes? °A miscarriage may be caused by: °· Problems with the genes or chromosomes of the fetus. These problems make it impossible for the baby to develop normally. They are often the result of random errors that occur early in the development of the baby, and are not passed from parent to child (not inherited). °· Infection of the cervix or uterus. °· Conditions that affect hormone balance in the body. °· Problems with the cervix, such as the cervix opening and thinning before pregnancy is at term (cervical insufficiency). °· Problems with the uterus. These may include: °? A uterus with an abnormal shape. °? Fibroids in the uterus. °? Congenital abnormalities. These are problems that were present at birth. °· Certain medical conditions. °· Smoking, drinking alcohol, or using drugs. °· Injury (trauma). °In many cases, the cause of a miscarriage is not known. °What are the signs or symptoms? °Symptoms of this condition include: °· Vaginal bleeding or spotting, with or without cramps or pain. °· Pain or cramping in the abdomen or lower back. °· Passing fluid, tissue, or blood clots from the vagina. °How is this diagnosed? °This condition may be diagnosed based on: °· A physical exam. °· Ultrasound. °· Blood tests. °· Urine tests. °How is this treated? °Treatment for a miscarriage is sometimes not necessary if you naturally pass all the tissue that was in your uterus. If necessary, this condition may be treated with: °· Dilation and  curettage (D&C). This is a procedure in which the cervix is stretched open and the lining of the uterus (endometrium) is scraped. This is done only if tissue from the fetus or placenta remains in the body (incomplete miscarriage). °· Medicines, such as: °? Antibiotic medicine, to treat infection. °? Medicine to help the body pass any remaining tissue. °? Medicine to reduce (contract) the size of the uterus. These medicines may be given if you have a lot of bleeding. °If you have Rh negative blood and your baby was Rh positive, you will need a shot of a medicine called Rh immunoglobulinto protect your future babies from Rh blood problems. "Rh-negative" and "Rh-positive" refer to whether or not the blood has a specific protein found on the surface of red blood cells (Rh factor). °Follow these instructions at home: °Medicines ° °· Take over-the-counter and prescription medicines only as told by your health care provider. °· If you were prescribed antibiotic medicine, take it as told by your health care provider. Do not stop taking the antibiotic even if you start to feel better. °· Do not take NSAIDs, such as aspirin and ibuprofen, unless they are approved by your health care provider. These medicines can cause bleeding. °Activity °· Rest as directed. Ask your health care provider what activities are safe for you. °· Have someone help with home and family responsibilities during this time. °General instructions °· Keep track of the number of sanitary pads you use each day and how soaked (saturated) they are. Write down this information. °· Monitor the amount of tissue or blood clots that   help with home and family responsibilities during this time.  General instructions  · Keep track of the number of sanitary pads you use each day and how soaked (saturated) they are. Write down this information.  · Monitor the amount of tissue or blood clots that you pass from your vagina. Save any large amounts of tissue for your health care provider to examine.  · Do not use tampons, douche, or have sex until your health care provider approves.  · To help you and your partner with the process of grieving, talk with your health care provider or seek counseling.  · When you are ready, meet with  your health care provider to discuss any important steps you should take for your health. Also, discuss steps you should take to have a healthy pregnancy in the future.  · Keep all follow-up visits as told by your health care provider. This is important.  Where to find more information  · The American [employer] of Obstetricians [employer] Gynecologists: www.acog.org  · [place] Department of Health and Human Services Office of Women’s Health: www.womenshealth.gov  Contact a health care provider if:  · You have a fever or chills.  · You have a foul smelling vaginal discharge.  · You have more bleeding instead of less.  Get help right away if:  · You have severe cramps or pain in your back or abdomen.  · You pass blood clots or tissue from your vagina that is walnut-sized or larger.  · You soak more than 1 regular sanitary pad in an hour.  · You become light-headed or weak.  · You pass out.  · You have feelings of sadness that take over your thoughts, or you have thoughts of hurting yourself.  Summary  · Most miscarriages happen in the first 3 months of pregnancy. Sometimes miscarriage happens before a woman even knows that she is pregnant.  · Follow your health care provider's instruction for home care. Keep all follow-up appointments.  · To help you and your partner with the process of grieving, talk with your health care provider or seek counseling.  This information is not intended to replace advice given to you by your health care provider. Make sure you discuss any questions you have with your health care provider.  Document Released: 05/27/2001 Document Revised: 01/06/2017 Document Reviewed: 01/06/2017  Elsevier Interactive Patient Education © 2019 [employer]

## 2019-05-27 ENCOUNTER — Telehealth: Payer: Self-pay | Admitting: Student

## 2019-05-27 NOTE — Telephone Encounter (Signed)
Attempted to call patient about her appointment, and how she needed to have her MyChart app downloaded in order to have this visit. A voicemail was left.

## 2019-05-30 ENCOUNTER — Ambulatory Visit: Payer: Medicare Other

## 2019-06-03 ENCOUNTER — Telehealth: Payer: Self-pay | Admitting: Obstetrics & Gynecology

## 2019-06-03 ENCOUNTER — Telehealth: Payer: Self-pay | Admitting: Family Medicine

## 2019-06-03 ENCOUNTER — Other Ambulatory Visit: Payer: Self-pay | Admitting: General Practice

## 2019-06-03 ENCOUNTER — Ambulatory Visit (INDEPENDENT_AMBULATORY_CARE_PROVIDER_SITE_OTHER)
Admission: RE | Admit: 2019-06-03 | Discharge: 2019-06-03 | Disposition: A | Discharge status: Home / Self Care | Payer: Medicare Other | Source: Ambulatory Visit

## 2019-06-03 DIAGNOSIS — O039 Complete or unspecified spontaneous abortion without complication: Secondary | ICD-10-CM

## 2019-06-03 NOTE — Discharge Instructions (Addendum)
Follow up with OB

## 2019-06-03 NOTE — Telephone Encounter (Signed)
Patient said she want to speak with a nurse to give her some understanding, said she took a few home pregnancy test and they all came back positive, want to know why she;s being told she had a miscarriage.

## 2019-06-03 NOTE — Telephone Encounter (Signed)
Patient's concerns/questions are being answered via mychart.

## 2019-06-03 NOTE — ED Provider Notes (Addendum)
Virtual Visit via Video Note:  [name]  initiated request for Telemedicine visit with [facility] team. I connected with [name]  on 06/03/2019 at 2:40 PM  for a synchronized telemedicine visit using a video enabled HIPPA compliant telemedicine application. I verified that I am speaking with [name]  using two identifiers. Orvan July, NP  was physically located in a [facility] Urgent care site and [name] was located at a different location.   The limitations of evaluation and management by telemedicine as well as the availability of in-person appointments were discussed. Patient was informed that she  may incur a bill ( including co-pay) for this virtual visit encounter. [name]  expressed understanding and gave verbal consent to proceed with virtual visit.     History of Present Illness:[name]  is a 33 y.o. female presents with questions about her recent miscarriage and positive pregnancy test.   Reporting that she had a miscarriage and just stopped bleeding yesterday.  She has still been taking at home pregnancy tests which are still positive.  She has been doing hCGs with her OB with levels dropping. She is wanting to know how soon she can start trying to conceive again. Reporting that she had sexual intercourse last night. She showed me positive at home pregnancy test on camera. Reports that her HCG levels have been dropping and definite confirmed miscarriage.   She has already seen her OB for this and had questions answered a few days ago. They have scheduled her for appointment on the 22nd and blood draw.  Past Medical History:  Diagnosis Date  . Anxiety   . Asthma    inhaler PRN-hasn't used inhaler in months  . Attention deficit hyperactivity disorder   . Bipolar disorder [facility])    hospitalized as a teen for suicidal ideations and cutting  . Depression    no complaints, doing well  . Gastritis   . GERD  (gastroesophageal reflux disease)   . HPV (human papilloma virus) anogenital infection   . Hx of varicella   . Mild preeclampsia 02/09/2016  . Pregnancy induced hypertension   . Vaginal Pap smear, abnormal     Allergies  Allergen Reactions  . Depakote [Divalproex Sodium] Other (See Comments)    Pt states that this medication makes her blood levels toxic.    [name] Methylphenidate Derivatives Other (See Comments)    Reaction:  Depression and anger   . Neurontin [Gabapentin] Other (See Comments)    Reaction:  Dizziness   . Prozac [Fluoxetine Hcl] Other (See Comments)    Reaction:  Anger         Observations/Objective:GENERAL APPEARANCE: Well developed, well nourished, alert and cooperative, and appears to be in no acute distress. HEAD: normocephalic. Non labored breathing, no dyspnea or distress Skin: Skin normal color  PSYCHIATRIC: The mental examination revealed the patient was oriented to person, place, and time. The patient was able to demonstrate good judgement and reason, without hallucinations, abnormal affect or abnormal behaviors during the examination. Patient is not suicidal     Assessment and Plan: Pt reassured that she could have positive at home pregnancy test for weeks after miscarriage. Also reassured and recommended that she wait a few months before she try to conceive again so her body can heal and lessen her chance of another miscarriage.  Pt understanding.     Follow Up Instructions: Follow up with OB for further problems.  I discussed the assessment and treatment plan with the patient. The patient was provided an opportunity to ask questions and all were answered. The patient agreed with the plan and demonstrated an understanding of the instructions.   The patient was advised to call back or seek an in-person evaluation if the symptoms worsen or if the condition fails to improve as anticipated.      Orvan July, NP  06/03/2019 2:40 PM                    Orvan July, NP 06/03/19 1511

## 2019-06-03 NOTE — Telephone Encounter (Signed)
Patient is requesting a call back today. She wants to know why she is still testing positive for pregnancy, when she had a miscarriage. She wants to know is she still pregnant.

## 2019-06-06 ENCOUNTER — Other Ambulatory Visit: Payer: Medicare Other

## 2019-06-20 ENCOUNTER — Telehealth: Payer: Self-pay | Admitting: Obstetrics & Gynecology

## 2019-06-20 NOTE — Telephone Encounter (Signed)
The patient called in stating she would like to schedule an appointment. Informed the patient of the virtual appointment for tomorrow. Also educated of how to log in to mychart to complete the visit. The patient verbalized understanding.

## 2019-06-21 ENCOUNTER — Other Ambulatory Visit: Payer: Self-pay

## 2019-06-21 ENCOUNTER — Telehealth (INDEPENDENT_AMBULATORY_CARE_PROVIDER_SITE_OTHER): Payer: Medicare Other | Admitting: Student

## 2019-06-21 ENCOUNTER — Other Ambulatory Visit: Payer: Medicare Other

## 2019-06-21 DIAGNOSIS — Z5189 Encounter for other specified aftercare: Secondary | ICD-10-CM

## 2019-06-21 DIAGNOSIS — O039 Complete or unspecified spontaneous abortion without complication: Secondary | ICD-10-CM | POA: Diagnosis not present

## 2019-06-21 NOTE — Progress Notes (Signed)
I connected with  [name] on 06/21/19 at  9:15 AM EDT by telephone and verified that I am speaking with the correct person using two identifiers.   I discussed the limitations, risks, security and privacy concerns of performing an evaluation and management service by telephone and the availability of in person appointments. I also discussed with the patient that there may be a patient responsible charge related to this service. The patient expressed understanding and agreed to proceed.  Dolores Hoose, RN 06/21/2019  9:20 AM

## 2019-06-21 NOTE — Progress Notes (Signed)
Patient ID: [name], female   DOB: [birth year], 33 y.o.   MRN: [id number]     [name] VIRTUAL VIDEO VISIT ENCOUNTER NOTE  Provider location: [facility]   I connected with [name] on 06/21/19 at  9:15 AM EDT byMyChart Video Encounter at home and verified that I am speaking with the correct person using two identifiers.   I discussed the limitations, risks, security and privacy concerns of performing an evaluation and management service virtually and the availability of in person appointments. I also discussed with the patient that there may be a patient responsible charge related to this service. The patient expressed understanding and agreed to proceed.   History:  [name] is a 33 y.o. [phone] female being evaluated today for follow up from SAB. She denies any abnormal vaginal discharge, bleeding, pelvic pain or other concerns.  She says she has stopped bleeding over a week ago and has not had her period back.    Last bHCG on 05-26-2019 was 45. She denies pain or bleeding today. She is very tearful about her miscarriage and wants to know if she can get pregnant again soon. She has been having unprotected intercourse, last intercourse was two days ago. She has had a faintly positive  pregnancy test at home with a urine dip test but a "digital" test was negative. She wants a stat BCHG today.    Past Medical History:  Diagnosis Date  . Anxiety   . Asthma    inhaler PRN-hasn't used inhaler in months  . Attention deficit hyperactivity disorder   . Bipolar disorder [facility])    hospitalized as a teen for suicidal ideations and cutting  . Depression    no complaints, doing well  . Gastritis   . GERD (gastroesophageal reflux disease)   . HPV (human papilloma virus) anogenital infection   . Hx of varicella   . Mild preeclampsia 02/09/2016  . Pregnancy induced hypertension   . Vaginal Pap smear, abnormal    Past Surgical  History:  Procedure Laterality Date  . EYE MUSCLE SURGERY Bilateral    07/29/12  . MOUTH SURGERY    . PLANTAR FASCIA SURGERY    . WISDOM TOOTH EXTRACTION     The following portions of the patient's history were reviewed and updated as appropriate: allergies, current medications, past family history, past medical history, past social history, past surgical history and problem list.     Review of Systems:  Pertinent items noted in HPI and remainder of comprehensive ROS otherwise negative.  Physical Exam:   General:  Alert, oriented and cooperative. Patient appears to be in no acute distress.  Mental Status: Normal mood and affect. Normal behavior. Normal judgment and thought content.   Respiratory: Normal respiratory effort, no problems with respiration noted  Rest of physical exam deferred due to type of encounter  Labs and Imaging No results found for this or any previous visit (from the past 336 hour(s)). No results found.    Beta was 45 on 6/11.   Assessment and Plan:     1. Follow-up visit after miscarriage      2. Patient repeatedly asking for follow up bHCG. Explained that patient could come in today for non-stat beta; advised her not to have intercourse until we have her results back as we want to make sure her miscarriage is complete.   3. I recommended she not take anymore home pregnancy tests as they are  only making her anxious.   4. Patient verbalized understanding.       I discussed the assessment and treatment plan with the patient. The patient was provided an opportunity to ask questions and all were answered. The patient agreed with the plan and demonstrated an understanding of the instructions.   The patient was advised to call back or seek an in-person evaluation/go to the ED if the symptoms worsen or if the condition fails to improve as anticipated.  I provided 15  minutes of face-to-face time during this encounter.   Starr Lake,  [place] for [employer], [place]

## 2019-06-22 ENCOUNTER — Telehealth: Payer: Self-pay

## 2019-06-22 LAB — BETA HCG QUANT (REF LAB): hCG Quant: <1 m[IU]/mL

## 2019-06-22 NOTE — Telephone Encounter (Signed)
Pt call transferred from front office that pt is requesting a call in regards to her concerns.

## 2019-06-30 ENCOUNTER — Telehealth: Payer: Medicare Other

## 2019-07-01 ENCOUNTER — Encounter (HOSPITAL_COMMUNITY): Payer: Self-pay | Admitting: Student

## 2019-07-01 ENCOUNTER — Telehealth (INDEPENDENT_AMBULATORY_CARE_PROVIDER_SITE_OTHER): Payer: Medicare Other | Admitting: *Deleted

## 2019-07-01 ENCOUNTER — Inpatient Hospital Stay (HOSPITAL_COMMUNITY)
Admission: AD | Admit: 2019-07-01 | Discharge: 2019-07-01 | Disposition: A | Discharge status: Home / Self Care | Payer: Medicare Other | Attending: Obstetrics and Gynecology | Admitting: Obstetrics and Gynecology

## 2019-07-01 DIAGNOSIS — O4690 Antepartum hemorrhage, unspecified, unspecified trimester: Secondary | ICD-10-CM

## 2019-07-01 DIAGNOSIS — R1110 Vomiting, unspecified: Secondary | ICD-10-CM | POA: Insufficient documentation

## 2019-07-01 DIAGNOSIS — R58 Hemorrhage, not elsewhere classified: Secondary | ICD-10-CM | POA: Diagnosis not present

## 2019-07-01 DIAGNOSIS — Z3202 Encounter for pregnancy test, result negative: Secondary | ICD-10-CM | POA: Insufficient documentation

## 2019-07-01 DIAGNOSIS — N939 Abnormal uterine and vaginal bleeding, unspecified: Secondary | ICD-10-CM | POA: Diagnosis not present

## 2019-07-01 LAB — URINALYSIS, ROUTINE W REFLEX MICROSCOPIC
Bilirubin Urine: NEGATIVE
Glucose, UA: NEGATIVE mg/dL
Ketones, ur: NEGATIVE mg/dL
Leukocytes,Ua: NEGATIVE
Nitrite: NEGATIVE
Protein, ur: 30 mg/dL — AB
RBC / HPF: >50 RBC/hpf — ABNORMAL HIGH (ref 0–5)
Specific Gravity, Urine: 1.029 (ref 1.005–1.030)
pH: 5 (ref 5.0–8.0)

## 2019-07-01 LAB — HCG, QUANTITATIVE, PREGNANCY: hCG, Beta Chain, Quant, S: <1 m[IU]/mL (ref <5)

## 2019-07-01 NOTE — MAU Provider Note (Addendum)
First Provider Initiated Contact with Patient 07/01/19 1517      S [name] is a 33 y.o. [phone] female who presents with concerns of pregnancy. Reports onset of VB 6 days ago. She began to have vomiting and took a HPT yesterday which was positive. Recently had SAB at end of June. She would like to become pregnancy again.   O BP 134/60   Pulse (!) 54   Temp 98.1 F (36.7 C)   Resp 18   LMP 06/25/2019   Breastfeeding Unknown  Physical Exam  Nursing note and vitals reviewed. Constitutional: She is oriented to person, place, and time. She appears well-developed and well-nourished. No distress.  HENT:  Head: Normocephalic and atraumatic.  Musculoskeletal: Normal range of motion.  Neurological: She is alert and oriented to person, place, and time.  Psychiatric: She has a normal mood and affect.   Results for orders placed or performed during the hospital encounter of 07/01/19 (from the past 24 hour(s))  Urinalysis, Routine w reflex microscopic     Status: Abnormal   Collection Time: 07/01/19  1:22 PM  Result Value Ref Range   Color, Urine YELLOW YELLOW   APPearance HAZY (A) CLEAR   Specific Gravity, Urine 1.029 1.005 - 1.030   pH 5.0 5.0 - 8.0   Glucose, UA NEGATIVE NEGATIVE mg/dL   Hgb urine dipstick LARGE (A) NEGATIVE   Bilirubin Urine NEGATIVE NEGATIVE   Ketones, ur NEGATIVE NEGATIVE mg/dL   Protein, ur 30 (A) NEGATIVE mg/dL   Nitrite NEGATIVE NEGATIVE   Leukocytes,Ua NEGATIVE NEGATIVE   RBC / HPF >50 (H) 0 - 5 RBC/hpf   WBC, UA 0-5 0 - 5 WBC/hpf   Bacteria, UA RARE (A) NONE SEEN   Squamous Epithelial / LPF 0-5 0 - 5   Mucus PRESENT   hCG, quantitative, pregnancy     Status: None   Collection Time: 07/01/19  1:43 PM  Result Value Ref Range   hCG, Beta Chain, Quant, S <1 <5 mIU/mL  UPT: neg  MDM: UPT negative, qHCG negative. Discussed results with pt, no evidence of pregnancy, VB likely d/t menstrual cycle.  A Non pregnant female Medical screening exam  complete  P Discharge from MAU in stable condition Follow up with GYN as needed Patient may return to MAU as needed for pregnancy related complaints  Julianne Handler, CNM 07/01/2019 3:22 PM

## 2019-07-01 NOTE — MAU Note (Signed)
.   [name] is a 33 y.o. at Unknown here in MAU reporting: that she had a positive  Pregnancy test at home and she is having vaginal bleeding.[name]  LMP: 06/25/19  Pain score: 0 Vitals:   07/01/19 1516  BP: 134/60  Pulse: (!) 54  Resp: 18  Temp: 98.1 F (36.7 C)      Lab orders placed from triage: UPT/QUANT

## 2019-07-01 NOTE — Telephone Encounter (Signed)
Pt called and spoke with after hours nurse who then transferred pt call to me. Pt had recent miscarriage in June and her most recent BHCG on 7/7 was result of <1. On 7/12, she began bleeding and thought it was the beginning of a new period. The bleeding has been off and on for several days. She stated that now she is possibly passing some clots, throwing up and has breast tenderness. She denies pain. She took a home UPT today and it is positive. She wants to know if this is for sure a new  pregnancy and if it is possibly a miscarriage.  I consulted with Dr. Roselie Awkward and advised pt that we do believe this is most likely a new pregnancy. It may be a miscarriage. If she has abdominal/pelvic pain or if the bleeding is heavy, she should go to MAU for evaluation. Pt states she would not have a ride home if she goes to the hospital. I advised that she does not have to go right now because she is not having pain and the bleeding is not consistent. She may monitor her symptoms and go to the hospital if they become worse. If the bleeding stops and she continues to have symptoms of pregnancy, she may call back to schedule appointment for prenatal care. Pt voiced understanding.

## 2019-07-22 ENCOUNTER — Inpatient Hospital Stay (HOSPITAL_COMMUNITY)
Admission: AD | Admit: 2019-07-22 | Discharge: 2019-07-22 | Disposition: A | Discharge status: Home / Self Care | Payer: Medicare Other | Attending: Obstetrics & Gynecology | Admitting: Obstetrics & Gynecology

## 2019-07-22 ENCOUNTER — Other Ambulatory Visit: Payer: Self-pay

## 2019-07-22 DIAGNOSIS — O26851 Spotting complicating pregnancy, first trimester: Secondary | ICD-10-CM | POA: Insufficient documentation

## 2019-07-22 DIAGNOSIS — N939 Abnormal uterine and vaginal bleeding, unspecified: Secondary | ICD-10-CM | POA: Diagnosis not present

## 2019-07-22 DIAGNOSIS — Z3201 Encounter for pregnancy test, result positive: Secondary | ICD-10-CM | POA: Diagnosis not present

## 2019-07-22 DIAGNOSIS — Z3A00 Weeks of gestation of pregnancy not specified: Secondary | ICD-10-CM | POA: Diagnosis not present

## 2019-07-22 LAB — POCT PREGNANCY, URINE: Preg Test, Ur: NEGATIVE

## 2019-07-22 LAB — HCG, QUANTITATIVE, PREGNANCY: hCG, Beta Chain, Quant, S: 11 m[IU]/mL — ABNORMAL HIGH (ref <5)

## 2019-07-22 NOTE — MAU Provider Note (Signed)
First Provider Initiated Contact with Patient 07/22/19 1759      S [name] is a 33 y.o. [phone] non-pregnant female who presents to MAU via EMS today with complaint of vaginal spotting & positive pregnancy test. Reports positive pregnancy test last night. Had episode of pink spotting on toilet paper this morning. No bleeding since. No abdominal pain.   O BP 131/69 (BP Location: Right Arm)   Pulse 83   Temp 98.5 F (36.9 C) (Oral)   Resp 18   Ht 5\' 3"  (1.6 m)   Wt 106.1 kg   LMP 06/24/2019   SpO2 97%   BMI 41.45 kg/m  Physical Exam  Nursing note and vitals reviewed. Constitutional: She appears well-developed and well-nourished.  Respiratory: Effort normal. No respiratory distress.  Psychiatric: She has a normal mood and affect. Her behavior is normal. Judgment and thought content normal.    A 1. Positive blood pregnancy test   2. Vaginal spotting   -UPT negative. HCG= 11. Will repeat HCG in office on Monday   P Discharge from MAU in stable condition Scheduled for stat HCG at [facility] on Monday Discussed reasons to return to [place], [place], NP 07/22/2019 7:21 PM

## 2019-07-22 NOTE — MAU Note (Addendum)
Pt reports positive pregancy tests at home. Spotting started this morning.  LMP June 24 2019.

## 2019-07-23 ENCOUNTER — Other Ambulatory Visit: Payer: Self-pay

## 2019-07-23 ENCOUNTER — Inpatient Hospital Stay (HOSPITAL_COMMUNITY)
Admission: AD | Admit: 2019-07-23 | Discharge: 2019-07-23 | Disposition: A | Discharge status: Home / Self Care | Payer: Medicare Other | Attending: Family Medicine | Admitting: Family Medicine

## 2019-07-23 DIAGNOSIS — Z3A01 Less than 8 weeks gestation of pregnancy: Secondary | ICD-10-CM | POA: Diagnosis not present

## 2019-07-23 DIAGNOSIS — O209 Hemorrhage in early pregnancy, unspecified: Secondary | ICD-10-CM | POA: Diagnosis present

## 2019-07-23 DIAGNOSIS — I10 Essential (primary) hypertension: Secondary | ICD-10-CM | POA: Diagnosis not present

## 2019-07-23 NOTE — MAU Note (Signed)
[name] is a 33 y.o. here in MAU reporting: still having spotting, "it's bothering me". Just notices the bleeding when she wipes. Would like her blood work retested. No pain.  Onset of complaint: ongoing  Pain score: 0/10  Vitals:   07/23/19 1847  BP: 115/77  Pulse: 98  Resp: 16  Temp: 98.3 F (36.8 C)  SpO2: 100%      Lab orders placed from triage: none

## 2019-07-23 NOTE — MAU Provider Note (Signed)
History     CSN: [id number]  Arrival date and time: 07/23/19 2706   First Provider Initiated Contact with Patient 07/23/19 1900      Chief Complaint  Patient presents with  . Vaginal Bleeding   HPI [name] 33 y.o. Was seen in MAU yesterday and had quant of 11 with a negative urine test.  Was having spotting yesterday and has had spotting again today.  Came in by EMS as she wanted to be evaluated again for spotting.  Had a miscarriage 2 months ago and is very concerned she may be having a miscarriage again.  Requesting blood work and ultrasound.  OB History    Gravida  4   Para  3   Term  2   Preterm  1   AB  1   Living  3     SAB  1   TAB      Ectopic      Multiple  0   Live Births  3           Past Medical History:  Diagnosis Date  . Anxiety   . Asthma    inhaler PRN-hasn't used inhaler in months  . Attention deficit hyperactivity disorder   . Bipolar disorder [facility])    hospitalized as a teen for suicidal ideations and cutting  . Depression    no complaints, doing well  . Gastritis   . GERD (gastroesophageal reflux disease)   . HPV (human papilloma virus) anogenital infection   . Hx of varicella   . Mild preeclampsia 02/09/2016  . Pregnancy induced hypertension   . Vaginal Pap smear, abnormal     Past Surgical History:  Procedure Laterality Date  . EYE MUSCLE SURGERY Bilateral    07/29/12  . MOUTH SURGERY    . PLANTAR FASCIA SURGERY    . WISDOM TOOTH EXTRACTION      Family History  Problem Relation Age of Onset  . Depression Mother   . Colon cancer Mother   . Colon polyps Mother   . Depression Father   . Depression Brother   . Liver disease Other   . Kidney disease Other     Social History   Tobacco Use  . Smoking status: Never Smoker  . Smokeless tobacco: Never Used  Substance Use Topics  . Alcohol use: No    Alcohol/week: 0.0 standard drinks  . Drug use: No    Allergies:  Allergies  Allergen Reactions  .  Depakote [Divalproex Sodium] Other (See Comments)    Pt states that this medication makes her blood levels toxic.    [name] Methylphenidate Derivatives Other (See Comments)    Reaction:  Depression and anger   . Neurontin [Gabapentin] Other (See Comments)    Reaction:  Dizziness   . Prozac [Fluoxetine Hcl] Other (See Comments)    Reaction:  Anger     Medications Prior to Admission  Medication Sig Dispense Refill Last Dose  . ARIPiprazole (ABILIFY) 15 MG tablet Take 1 tablet (15 mg total) by mouth daily. (Patient not taking: Reported on 06/21/2019) 30 tablet 2   . busPIRone (BUSPAR) 10 MG tablet Take 1 tablet (10 mg total) by mouth 2 (two) times daily. (Patient not taking: Reported on 06/21/2019) 60 tablet 2   . Prenatal Vit-Fe Fumarate-FA (PRENATAL MULTIVITAMIN) TABS tablet Take 1 tablet by mouth at bedtime. (Patient not taking: Reported on 06/21/2019) 30 tablet 11     Review of Systems  Constitutional: Negative for  fever.  Respiratory: Negative for cough and shortness of breath.   Gastrointestinal: Negative for abdominal pain.  Genitourinary: Positive for dysuria and vaginal bleeding. Negative for vaginal discharge.   Physical Exam   Blood pressure 115/77, pulse 98, temperature 98.3 F (36.8 C), temperature source Oral, resp. rate 16, height 5\' 3"  (1.6 m), weight 106.2 kg, last menstrual period 06/25/2019, SpO2 100 %, unknown if currently breastfeeding.  Physical Exam GENERAL: Well-developed, well-nourished female in no acute distress. Sitting in triage chair with mask. HEENT: Normocephalic, atraumatic.  LUNGS: Effort normal  HEART: Regular rate  SKIN: Warm, dry and without erythema  PSYCH: Normal mood and affect.  Asks questions well.     MAU Course  Procedures  MDM Discussed care given yesterday and the plan for managing an early quant of 11.  Discussed with chaperone present that no intervention needs to be done today.  Reviewed pelvic rest.  Client has not had sex today but did  have digital penetration on Thursday.  Really wants to have a healthy pregnancy.  Has not had any positive STD testing since 2018.  Assessment and Plan  Spotting in first trimester with full exam and labs yesterday  Plan Keep your appointment in the clinic on Monday for lab work as scheduled.  Reviewed there may not be much information available on Monday and she may need more evaluation visits before we can determine the status of her pregnancy.  Advised that some spotting in early pregnancy may be normal.  Again reviewed there is no treatment needed today - client has no report of pain or any change since yesterday.  Saraann Enneking L Dawnyel Leven 07/23/2019, 7:00 PM

## 2019-07-25 ENCOUNTER — Ambulatory Visit: Payer: Medicare Other

## 2019-07-25 ENCOUNTER — Ambulatory Visit (INDEPENDENT_AMBULATORY_CARE_PROVIDER_SITE_OTHER): Payer: Medicare Other | Admitting: General Practice

## 2019-07-25 ENCOUNTER — Other Ambulatory Visit: Payer: Self-pay

## 2019-07-25 ENCOUNTER — Telehealth: Payer: Self-pay | Admitting: General Practice

## 2019-07-25 DIAGNOSIS — Z3490 Encounter for supervision of normal pregnancy, unspecified, unspecified trimester: Secondary | ICD-10-CM

## 2019-07-25 DIAGNOSIS — O3680X0 Pregnancy with inconclusive fetal viability, not applicable or unspecified: Secondary | ICD-10-CM

## 2019-07-25 LAB — BETA HCG QUANT (REF LAB): hCG Quant: 45 m[IU]/mL

## 2019-07-25 NOTE — Telephone Encounter (Signed)
Called patient and informed her of bhcg levels rising appropriately. Discussed repeating another quant (non stat) in 1 week & if levels continue to rise appropriately scheduling an ultrasound based off those results. Patient verbalized understanding & asked how far along she is. Told patient she is approximately a week and likely got pregnant last week. Patient asked why she is spotting then. Told patient it likely could be implantation bleeding, it's hard to say but we are reassured for now that this seems to be a normal progressing pregnancy. Reviewed ectopic precautions. Patient verbalized understanding & states she can come in 8/17 @ 2pm. Discussed if levels continue to rise appropriately, she likely will not have an ultrasound until the end of September. Patient verbalized understanding & had no other questions at this time.

## 2019-07-25 NOTE — Progress Notes (Signed)
Spoke with patient regarding her stat bhcg visit for today. Patient states she went to the hospital 2 days ago for spotting and is still spotting some and her quant was 11. Patient wants to know if this means she is having a miscarriage. Told patient we cannot know that based off one quant level. Discussed we need the additional one from today and maybe another to have more information regarding this pregnancy. Patient asked if this could be a healthy pregnancy and I told her yes it could be- we just don't have enough information right now based off one level. Told patient I will call her when results come back and give her the updated plan of care. Patient verbalized understanding and had no additional questions at this time.   Reviewed bhcg result of 45 today with Dr Roselie Awkward who finds appropriate rise in bhcg levels- patient should have additional bhcg in 1 week and then based off those results scheduled for an ultrasound. Will call patient with results.  Koren Bound RN BSN 07/25/19

## 2019-07-25 NOTE — Progress Notes (Signed)
Pt here today for STAT Beta Lab.  Pt reports only having vaginal spotting.  She reports that she had some pain but it was related to constipation because after she went to the bathroom she did not have the pain anymore.  Pt states that she just had a miscarriage two months do I think she may be miscarrying again.  I explained to the pt that it could be a possibly that we need the numbers to compare to be able to determine which the direction her pregnancy is going.  I explained to the pt that it will take approximately two hours for results and that I will call her with results and f/u.  Pt requested to see another nurse to ask a question to get a second opinion.  I requested Morey Hummingbird to go and speak with pt.

## 2019-07-27 ENCOUNTER — Telehealth: Payer: Self-pay | Admitting: Family Medicine

## 2019-07-27 NOTE — Telephone Encounter (Signed)
Patient is requesting a call back.

## 2019-07-28 ENCOUNTER — Telehealth: Payer: Self-pay | Admitting: Lactation Services

## 2019-07-28 DIAGNOSIS — Z3201 Encounter for pregnancy test, result positive: Secondary | ICD-10-CM

## 2019-07-28 NOTE — Telephone Encounter (Signed)
Attempted to call pt as she requested. She did not answer. LM for her to call the office at her earliest convenience.

## 2019-07-28 NOTE — Telephone Encounter (Signed)
Pt called and wanted to know if we are expecting her levels to drop. Discussed we do not know what her levels will do and that is the levels drop that would indicate an unhealth pregnancy and if they rise it may or may not indicate a healthy pregnancy.   Pt asked how long implantation bleeding lasts. Discussed it depends on the patient. Pt reports she had some bright red bleeding x 1 with wiping yesterday and is gone today. She reports previously that it is pink to brown.   Pt reports her last cycle was July 11. She is concerned about her levels. Again discussed we will need to get more levels to make a good assessment of the situation. Pt asked when she would have an [place], discussed it will depend on her lab levels.   Discussed with pt that is she is having bleeding like a period or severe cramping or one sided pain that she needs to go to the Maternity Assessment Unit at the [facility] and [facility] at [facility] for Evaluation.  Pt voiced understanding.   Acknowledged that I understand that pt is concerned and only time will allow [place] to see how the pregnancy is progressing.

## 2019-08-01 ENCOUNTER — Other Ambulatory Visit (INDEPENDENT_AMBULATORY_CARE_PROVIDER_SITE_OTHER): Payer: Medicare Other | Admitting: *Deleted

## 2019-08-01 ENCOUNTER — Other Ambulatory Visit: Payer: Self-pay

## 2019-08-01 DIAGNOSIS — Z3490 Encounter for supervision of normal pregnancy, unspecified, unspecified trimester: Secondary | ICD-10-CM

## 2019-08-01 DIAGNOSIS — O3680X0 Pregnancy with inconclusive fetal viability, not applicable or unspecified: Secondary | ICD-10-CM

## 2019-08-01 NOTE — Addendum Note (Signed)
Addended by: Kandyce Rud on: 08/01/2019 01:33 PM   Modules accepted: Level of Service

## 2019-08-01 NOTE — Progress Notes (Signed)
Pt here for nonstat repeat BHCG. She had some questions regarding a call from [facility] regarding BHCG. States she was told we were waiting for labs to return to normal but her BHCG actually doubled as expected. Told pt with her recent SAB her quants had returned to <1 or two occasions so this is a new pregnancy. The last lab showed her BHCG had risen appropriately. She reports no spotting for 2 days. She had previously asked if ok to have intercourse. Explained it would be good to wait until at least todays results are back so if she has spotting and does not have intercourse, it will help decide further plan of care. She voices understanding and will wait to get BHCG results which she knows will be back tomorrow.

## 2019-08-01 NOTE — Progress Notes (Signed)
Chart reviewed - agree with RN documentation.   

## 2019-08-02 ENCOUNTER — Telehealth: Payer: Self-pay | Admitting: Nurse Practitioner

## 2019-08-02 ENCOUNTER — Telehealth: Payer: Self-pay | Admitting: Emergency Medicine

## 2019-08-02 ENCOUNTER — Telehealth: Payer: Self-pay | Admitting: Family Medicine

## 2019-08-02 DIAGNOSIS — Z3490 Encounter for supervision of normal pregnancy, unspecified, unspecified trimester: Secondary | ICD-10-CM

## 2019-08-02 LAB — BETA HCG QUANT (REF LAB): hCG Quant: 1726 m[IU]/mL

## 2019-08-02 NOTE — Telephone Encounter (Signed)
Patient called in stating that she would like to know if her test results are back. Spoke with Jeanett Schlein and she stated that a provider has to review them and once this is done she will be receiving a call. Patient asked if she could speak to a nurse now. All nurses were busy and patient was instructed that a patient would be giving her a call back. Patient verbalized understanding

## 2019-08-02 NOTE — Telephone Encounter (Signed)
Pt called the front office wanting to know the results of her hcg beta drawn on 8/17. Pt denies pain or bleeding. Pt was informed of lab result and per Earlie Server, NP pt was scheduled for an ultrasound. Pt was made aware of ultrasound appointment on 8/26 @ 1pm. Pt was instructed to arrive 15 minutes early with a full bladder. Pt verbalized understanding and had no further questions.

## 2019-08-02 NOTE — Telephone Encounter (Signed)
The patient stated she would like a call back regarding her test results.

## 2019-08-04 ENCOUNTER — Telehealth (INDEPENDENT_AMBULATORY_CARE_PROVIDER_SITE_OTHER): Payer: Medicare Other | Admitting: General Practice

## 2019-08-04 DIAGNOSIS — O09899 Supervision of other high risk pregnancies, unspecified trimester: Secondary | ICD-10-CM

## 2019-08-04 NOTE — Telephone Encounter (Signed)
Patient called into front office stating she still couldn't see her results in mychart. Results were released by a provider & patient informed. Patient verbalized understanding & asked if we knew how far along she was. Told patient we do not and will not know until after her ultrasound appt. Discussed we have to be able to see the baby on ultrasound to measure in order to know how far along she is. Told patient if she is too early for [place] to see the baby on next week's ultrasound she will have to have a follow up ultrasound likely 2 weeks later. Patient verbalized understanding and asked if we were no longer concerned about miscarriage since her hormone levels are increasing so much. Told patient the way they are increasing is certainly reassuring. Patient verbalized understanding & had no other questions.

## 2019-08-08 NOTE — Telephone Encounter (Signed)
Pt concerns addressed.

## 2019-08-10 ENCOUNTER — Ambulatory Visit (HOSPITAL_COMMUNITY)
Admission: RE | Admit: 2019-08-10 | Discharge: 2019-08-10 | Disposition: A | Discharge status: Home / Self Care | Payer: Medicare Other | Source: Ambulatory Visit | Attending: Nurse Practitioner | Admitting: Nurse Practitioner

## 2019-08-10 ENCOUNTER — Ambulatory Visit (INDEPENDENT_AMBULATORY_CARE_PROVIDER_SITE_OTHER): Payer: Medicare Other | Admitting: General Practice

## 2019-08-10 ENCOUNTER — Other Ambulatory Visit: Payer: Self-pay

## 2019-08-10 ENCOUNTER — Encounter: Payer: Self-pay | Admitting: Family Medicine

## 2019-08-10 DIAGNOSIS — Z3490 Encounter for supervision of normal pregnancy, unspecified, unspecified trimester: Secondary | ICD-10-CM

## 2019-08-10 DIAGNOSIS — Z712 Person consulting for explanation of examination or test findings: Secondary | ICD-10-CM

## 2019-08-10 NOTE — Progress Notes (Signed)
Patient seen and assessed by nursing staff during this encounter. I have reviewed the chart and agree with the documentation and plan.  Kerry Hough, PA-C 08/10/2019 5:06 PM

## 2019-08-10 NOTE — Progress Notes (Signed)
Patient presents to office today for viability ultrasound results. Reviewed results with Kerry Hough who finds single living [facility]UP- patient should begin prenatal care.  Informed patient of results, provided pictures, & reviewed dating. Patient verbalized understanding & requests to start care here. Patient will return for OB care.   Koren Bound RN BSN 08/10/19

## 2019-08-15 DIAGNOSIS — O09899 Supervision of other high risk pregnancies, unspecified trimester: Secondary | ICD-10-CM

## 2019-08-15 MED ORDER — PROMETHAZINE HCL 25 MG PO TABS: 25.0000 mg | ORAL_TABLET | Freq: Four times a day (QID) | ORAL | 0 refills | Status: DC | PRN

## 2019-08-23 ENCOUNTER — Telehealth: Payer: Self-pay | Admitting: Family Medicine

## 2019-08-23 NOTE — Telephone Encounter (Signed)
Received a call from patient stating she can't eat.

## 2019-08-23 NOTE — Telephone Encounter (Signed)
Responded to pt's concern via MyChart.

## 2019-08-29 ENCOUNTER — Other Ambulatory Visit: Payer: Self-pay | Admitting: *Deleted

## 2019-08-29 DIAGNOSIS — K219 Gastro-esophageal reflux disease without esophagitis: Secondary | ICD-10-CM

## 2019-08-29 DIAGNOSIS — O219 Vomiting of pregnancy, unspecified: Secondary | ICD-10-CM

## 2019-08-29 NOTE — Progress Notes (Signed)
Patient requesting reglan for nausea and vomiting. Will route to provider.

## 2019-08-29 NOTE — Progress Notes (Signed)
Ok to have Reglan for N/V

## 2019-09-01 MED ORDER — METOCLOPRAMIDE HCL 10 MG PO TABS: 10.0000 mg | ORAL_TABLET | Freq: Four times a day (QID) | ORAL | 0 refills | Status: DC | PRN

## 2019-09-01 NOTE — Progress Notes (Signed)
Refill approved by Dr.Ervin and I sent in RX to her pharmacy. I called [name] and left a message RX approved and sent in your pharmacy. Call us if questions. Duru Reiger,RN

## 2019-09-12 ENCOUNTER — Other Ambulatory Visit: Payer: Self-pay

## 2019-09-12 ENCOUNTER — Ambulatory Visit (INDEPENDENT_AMBULATORY_CARE_PROVIDER_SITE_OTHER): Payer: Medicare Other | Admitting: *Deleted

## 2019-09-12 DIAGNOSIS — O0990 Supervision of high risk pregnancy, unspecified, unspecified trimester: Secondary | ICD-10-CM | POA: Insufficient documentation

## 2019-09-12 DIAGNOSIS — Z8632 Personal history of gestational diabetes: Secondary | ICD-10-CM

## 2019-09-12 DIAGNOSIS — O09219 Supervision of pregnancy with history of pre-term labor, unspecified trimester: Secondary | ICD-10-CM

## 2019-09-12 DIAGNOSIS — O09299 Supervision of pregnancy with other poor reproductive or obstetric history, unspecified trimester: Secondary | ICD-10-CM

## 2019-09-12 DIAGNOSIS — O09899 Supervision of other high risk pregnancies, unspecified trimester: Secondary | ICD-10-CM

## 2019-09-12 MED ORDER — BLOOD PRESSURE KIT DEVI: 1.0000 | 0 refills | Status: DC | PRN

## 2019-09-12 NOTE — Progress Notes (Signed)
I connected with  [name] on 09/12/19 at  3:30 PM EDT by telephone and verified that I am speaking with the correct person using two identifiers.   I discussed the limitations, risks, security and privacy concerns of performing an evaluation and management service by telephone and the availability of in person appointments. I also discussed with the patient that there may be a patient responsible charge related to this service. The patient expressed understanding and agreed to proceed.  Explained I am completing her New OB Intake today. We discussed Her EDD and that it is based on  sure LMP . I reviewed her allergies, meds, OB History, Medical /Surgical history, and appropriate screenings. I explained we will send her [employer] app- app sent to her while on phone.  Explained we will send a blood pressure cuff to her and the pharmacy that will send it will call her to verify her address. I asked her to bring the blood pressure cuff with her to her first ob appointment so we can show her how to use it. Explained  then we will have her take her blood pressure weekly and enter into the app. Explained she will have some visits in office and some virtually. She already has Community education officer. Reviewed appointment date/ time with her , our location and to wear mask, no visitors. Explained she will have exam, ob bloodwork, hemoglobin a1C, cbg , genetic testing if desired, pap if needed. I scheduled an [place] at 19 weeks and gave her the appointment. She is c/o N&V, losing weight. She is using phenergan and has not yet gotten the reglan due to financial issues- she asked if appointment can be moved up. I informed her I will ask registrars to move up if possible.  She voices understanding.  Linda,RN 09/12/2019  3:31 PM

## 2019-10-04 ENCOUNTER — Encounter: Payer: Self-pay | Admitting: Obstetrics and Gynecology

## 2019-10-04 ENCOUNTER — Ambulatory Visit (INDEPENDENT_AMBULATORY_CARE_PROVIDER_SITE_OTHER): Payer: Medicare Other | Admitting: Obstetrics and Gynecology

## 2019-10-04 ENCOUNTER — Other Ambulatory Visit: Payer: Self-pay

## 2019-10-04 VITALS — BP 120/81 | HR 114 | Temp 98.1°F | Wt 218.3 lb

## 2019-10-04 DIAGNOSIS — O0990 Supervision of high risk pregnancy, unspecified, unspecified trimester: Secondary | ICD-10-CM

## 2019-10-04 DIAGNOSIS — O219 Vomiting of pregnancy, unspecified: Secondary | ICD-10-CM

## 2019-10-04 DIAGNOSIS — O09891 Supervision of other high risk pregnancies, first trimester: Secondary | ICD-10-CM

## 2019-10-04 DIAGNOSIS — Z8632 Personal history of gestational diabetes: Secondary | ICD-10-CM

## 2019-10-04 DIAGNOSIS — O0991 Supervision of high risk pregnancy, unspecified, first trimester: Secondary | ICD-10-CM

## 2019-10-04 DIAGNOSIS — O09899 Supervision of other high risk pregnancies, unspecified trimester: Secondary | ICD-10-CM

## 2019-10-04 DIAGNOSIS — Z3A13 13 weeks gestation of pregnancy: Secondary | ICD-10-CM

## 2019-10-04 DIAGNOSIS — O09291 Supervision of pregnancy with other poor reproductive or obstetric history, first trimester: Secondary | ICD-10-CM

## 2019-10-04 DIAGNOSIS — O09299 Supervision of pregnancy with other poor reproductive or obstetric history, unspecified trimester: Secondary | ICD-10-CM

## 2019-10-04 HISTORY — DX: Vomiting of pregnancy, unspecified: O21.9

## 2019-10-04 MED ORDER — ASPIRIN EC 81 MG PO TBEC: 81.0000 mg | DELAYED_RELEASE_TABLET | Freq: Every day | ORAL | 2 refills | Status: DC

## 2019-10-04 MED ORDER — PROMETHAZINE HCL 25 MG PO TABS: 25.0000 mg | ORAL_TABLET | Freq: Four times a day (QID) | ORAL | 1 refills | Status: DC | PRN

## 2019-10-04 MED ORDER — ONDANSETRON 4 MG PO TBDP: 4.0000 mg | ORAL_TABLET | Freq: Four times a day (QID) | ORAL | 1 refills | Status: DC | PRN

## 2019-10-04 NOTE — Progress Notes (Signed)
Was advised by labs that Pts vein blowed, pt was dehydrated. Need to get labs at next visit please.

## 2019-10-04 NOTE — Patient Instructions (Signed)

## 2019-10-05 ENCOUNTER — Encounter: Payer: Self-pay | Admitting: Obstetrics and Gynecology

## 2019-10-05 LAB — POCT URINALYSIS DIP (DEVICE)
Glucose, UA: NEGATIVE mg/dL
Hgb urine dipstick: NEGATIVE
Ketones, ur: 80 mg/dL — AB
Leukocytes,Ua: NEGATIVE
Nitrite: NEGATIVE
Protein, ur: >=300 mg/dL — AB
Specific Gravity, Urine: >=1.03 (ref 1.005–1.030)
Urobilinogen, UA: 2 mg/dL — ABNORMAL HIGH (ref 0.0–1.0)
pH: 6 (ref 5.0–8.0)
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
## 2019-10-12 ENCOUNTER — Inpatient Hospital Stay (HOSPITAL_COMMUNITY)
Admission: AD | Admit: 2019-10-12 | Discharge: 2019-10-12 | Disposition: A | Discharge status: Home / Self Care | Payer: Medicare Other | Attending: Obstetrics and Gynecology | Admitting: Obstetrics and Gynecology

## 2019-10-12 ENCOUNTER — Encounter (HOSPITAL_COMMUNITY): Payer: Self-pay

## 2019-10-12 DIAGNOSIS — F319 Bipolar disorder, unspecified: Secondary | ICD-10-CM | POA: Insufficient documentation

## 2019-10-12 DIAGNOSIS — O0990 Supervision of high risk pregnancy, unspecified, unspecified trimester: Secondary | ICD-10-CM

## 2019-10-12 DIAGNOSIS — R1084 Generalized abdominal pain: Secondary | ICD-10-CM | POA: Diagnosis not present

## 2019-10-12 DIAGNOSIS — Z8632 Personal history of gestational diabetes: Secondary | ICD-10-CM | POA: Insufficient documentation

## 2019-10-12 DIAGNOSIS — Z801 Family history of malignant neoplasm of trachea, bronchus and lung: Secondary | ICD-10-CM | POA: Diagnosis not present

## 2019-10-12 DIAGNOSIS — Z3A14 14 weeks gestation of pregnancy: Secondary | ICD-10-CM

## 2019-10-12 DIAGNOSIS — Z888 Allergy status to other drugs, medicaments and biological substances status: Secondary | ICD-10-CM | POA: Diagnosis not present

## 2019-10-12 DIAGNOSIS — Z8759 Personal history of other complications of pregnancy, childbirth and the puerperium: Secondary | ICD-10-CM | POA: Diagnosis not present

## 2019-10-12 DIAGNOSIS — O0992 Supervision of high risk pregnancy, unspecified, second trimester: Secondary | ICD-10-CM

## 2019-10-12 DIAGNOSIS — Z818 Family history of other mental and behavioral disorders: Secondary | ICD-10-CM | POA: Diagnosis not present

## 2019-10-12 DIAGNOSIS — R112 Nausea with vomiting, unspecified: Secondary | ICD-10-CM | POA: Diagnosis not present

## 2019-10-12 DIAGNOSIS — O09299 Supervision of pregnancy with other poor reproductive or obstetric history, unspecified trimester: Secondary | ICD-10-CM

## 2019-10-12 DIAGNOSIS — Z800 Family history of malignant neoplasm of digestive organs: Secondary | ICD-10-CM | POA: Insufficient documentation

## 2019-10-12 DIAGNOSIS — O99342 Other mental disorders complicating pregnancy, second trimester: Secondary | ICD-10-CM | POA: Insufficient documentation

## 2019-10-12 DIAGNOSIS — O09292 Supervision of pregnancy with other poor reproductive or obstetric history, second trimester: Secondary | ICD-10-CM | POA: Diagnosis not present

## 2019-10-12 DIAGNOSIS — O219 Vomiting of pregnancy, unspecified: Secondary | ICD-10-CM | POA: Diagnosis present

## 2019-10-12 LAB — COMPREHENSIVE METABOLIC PANEL
ALT: 12 U/L (ref 0–44)
AST: 13 U/L — ABNORMAL LOW (ref 15–41)
Albumin: 3.2 g/dL — ABNORMAL LOW (ref 3.5–5.0)
Alkaline Phosphatase: 61 U/L (ref 38–126)
Anion gap: 9 (ref 5–15)
BUN: 6 mg/dL (ref 6–20)
CO2: 23 mmol/L (ref 22–32)
Calcium: 9 mg/dL (ref 8.9–10.3)
Chloride: 106 mmol/L (ref 98–111)
Creatinine, Ser: 0.61 mg/dL (ref 0.44–1.00)
GFR calc Af Amer: >60 mL/min (ref >60)
GFR calc non Af Amer: >60 mL/min (ref >60)
Glucose, Bld: 90 mg/dL (ref 70–99)
Potassium: 4.8 mmol/L (ref 3.5–5.1)
Sodium: 138 mmol/L (ref 135–145)
Total Bilirubin: 0.3 mg/dL (ref 0.3–1.2)
Total Protein: 6.1 g/dL — ABNORMAL LOW (ref 6.5–8.1)

## 2019-10-12 LAB — CBC
HCT: 42.5 % (ref 36.0–46.0)
Hemoglobin: 14 g/dL (ref 12.0–15.0)
MCH: 31.1 pg (ref 26.0–34.0)
MCHC: 32.9 g/dL (ref 30.0–36.0)
MCV: 94.4 fL (ref 80.0–100.0)
Platelets: 135 10*3/uL — ABNORMAL LOW (ref 150–400)
RBC: 4.5 MIL/uL (ref 3.87–5.11)
RDW: 12.5 % (ref 11.5–15.5)
WBC: 5.3 10*3/uL (ref 4.0–10.5)
nRBC: 0 % (ref 0.0–0.2)

## 2019-10-12 LAB — URINALYSIS, ROUTINE W REFLEX MICROSCOPIC
Glucose, UA: NEGATIVE mg/dL
Hgb urine dipstick: NEGATIVE
Ketones, ur: 15 mg/dL — AB
Nitrite: NEGATIVE
Protein, ur: NEGATIVE mg/dL
Specific Gravity, Urine: 1.025 (ref 1.005–1.030)
pH: 6 (ref 5.0–8.0)

## 2019-10-12 LAB — URINALYSIS, MICROSCOPIC (REFLEX): RBC / HPF: NONE SEEN RBC/hpf (ref 0–5)

## 2019-10-12 MED ORDER — SODIUM CHLORIDE 0.9 % IV SOLN
8.0000 mg | Freq: Once | INTRAVENOUS | Status: AC
  Administered 2019-10-12: 8 mg via INTRAVENOUS
  Filled 2019-10-12: qty 4

## 2019-10-12 MED ORDER — ONDANSETRON 4 MG PO TBDP: 8.0000 mg | ORAL_TABLET | Freq: Four times a day (QID) | ORAL | 1 refills | Status: DC | PRN

## 2019-10-12 MED ORDER — LACTATED RINGERS IV SOLN
INTRAVENOUS | Status: DC
  Administered 2019-10-12: 14:00:00 via INTRAVENOUS

## 2019-10-12 MED ORDER — PROMETHAZINE HCL 25 MG PO TABS: 25.0000 mg | ORAL_TABLET | Freq: Four times a day (QID) | ORAL | 0 refills | Status: DC | PRN

## 2019-10-12 NOTE — MAU Provider Note (Signed)
Patient [name] is a 33 y.o. [id number]; At 31w5dhere with complaints of nausea and vomiting that started on October 15. She denies vaginal bleeding, vaginal discharge, contractions. She says she hasn't been able to keep "anything down" and that she is "miserable".  States that the Zofran is not working.   History     CSN: [id number] Arrival date and time: 10/12/19 1201   First Provider Initiated Contact with Patient 10/12/19 1311      Chief Complaint  Patient presents with  . Emesis  . Nausea   Emesis  This is a new problem. The current episode started 1 to 4 weeks ago. The problem occurs 2 to 4 times per day. The problem has been gradually worsening. The emesis has an appearance of stomach contents and bile. There has been no fever. Pertinent negatives include no chills, coughing or fever. Risk factors include suspect food intake.   She was given ODT zofran at her prenatal visit but it doesn't help; she says that she continues to throw up. She didn't have this with her other kids. She wants to know what's wrong.   OB History    Gravida  6   Para  4   Term  3   Preterm  1   AB  1   Living  3     SAB  1   TAB      Ectopic      Multiple  0   Live Births  3           Past Medical History:  Diagnosis Date  . Anxiety   . Asthma    inhaler PRN-hasn't used inhaler in months  . Attention deficit hyperactivity disorder   . Bipolar disorder ([facility]    hospitalized as a teen for suicidal ideations and cutting  . Depression    no complaints, doing well  . Gastritis   . GERD (gastroesophageal reflux disease)   . HPV (human papilloma virus) anogenital infection   . Hx of varicella   . Mild preeclampsia 02/09/2016  . Pregnancy induced hypertension   . Vaginal Pap smear, abnormal     Past Surgical History:  Procedure Laterality Date  . EYE MUSCLE SURGERY Bilateral    07/29/12  . MOUTH SURGERY    . PLANTAR FASCIA SURGERY    . WISDOM TOOTH EXTRACTION       Family History  Problem Relation Age of Onset  . Depression Mother   . Colon cancer Mother   . Colon polyps Mother   . Depression Father   . Lung cancer Father   . Depression Brother   . Liver disease Other   . Kidney disease Other     Social History   Tobacco Use  . Smoking status: Never Smoker  . Smokeless tobacco: Never Used  Substance Use Topics  . Alcohol use: No    Alcohol/week: 0.0 standard drinks  . Drug use: No    Allergies:  Allergies  Allergen Reactions  . Depakote [Divalproex Sodium] Other (See Comments)    Pt states that this medication makes her blood levels toxic.    .Marland KitchenMethylphenidate Derivatives Other (See Comments)    Reaction:  Depression and anger   . Neurontin [Gabapentin] Other (See Comments)    Reaction:  Dizziness   . Prozac [Fluoxetine Hcl] Other (See Comments)    Reaction:  Anger   . Methylphenidate Hcl     SAME AS PREVIOUS LISTED METHYLPHENIDATE  Medications Prior to Admission  Medication Sig Dispense Refill Last Dose  . aspirin EC 81 MG tablet Take 1 tablet (81 mg total) by mouth daily. Take after 12 weeks for prevention of preeclampsia later in pregnancy 300 tablet 2 10/12/2019 at Unknown time  . metoCLOPramide (REGLAN) 10 MG tablet Take 1 tablet (10 mg total) by mouth every 6 (six) hours as needed for nausea or vomiting. 120 tablet 0 10/12/2019 at Unknown time  . ondansetron (ZOFRAN ODT) 4 MG disintegrating tablet Take 1 tablet (4 mg total) by mouth every 6 (six) hours as needed for nausea. 20 tablet 1 10/12/2019 at Unknown time  . promethazine (PHENERGAN) 25 MG tablet Take 1 tablet (25 mg total) by mouth every 6 (six) hours as needed for nausea or vomiting. 30 tablet 1 10/12/2019 at Unknown time  . Blood Pressure Monitoring (BLOOD PRESSURE KIT) DEVI 1 Device by Does not apply route as needed. ICD 10 O09.90 1 Device 0     Review of Systems  Constitutional: Negative for chills and fever.  Respiratory: Negative for cough.    Gastrointestinal: Positive for vomiting.  Genitourinary: Negative.   Neurological: Negative.    Physical Exam   Blood pressure 113/71, pulse 68, temperature 98.6 F (37 C), resp. rate 16, height 5' 3"  (1.6 m), weight 99.3 kg, last menstrual period 06/25/2019, SpO2 99 %, unknown if currently breastfeeding.  Physical Exam  Constitutional: She is oriented to person, place, and time. She appears well-developed.  HENT:  Head: Normocephalic.  Eyes: Pupils are equal, round, and reactive to light.  Neck: Normal range of motion.  Respiratory: Effort normal.  GI: Soft.  Musculoskeletal: Normal range of motion.  Neurological: She is alert and oriented to person, place, and time.  Skin: Skin is warm and dry.  Psychiatric: She has a normal mood and affect.    MAU Course  Procedures  MDM -UA: mild ketones -will start IV and give IV Zofran -will check CBC and CMP: all normal.   FHR is 156 by Doppler   Patient insisted on eating a large meal before getting all of her antinausea medicine. She says "I think the Zofran is going to work".  She kept down cheeseburger, french fries and ginger ale and crackers. Repeatedly asked if she was going to be admitted .   Assessment and Plan   1. Nausea and vomiting during pregnancy   2. Supervision of high risk pregnancy, antepartum   3. Hx of preeclampsia, prior pregnancy, currently pregnant   4. History of gestational diabetes in prior pregnancy, currently pregnant    -Patient abruptly decided to leave after eating her cheeseburger; did not have enough time to evaluate whether or not the IV zofran worked.  -Rx for phenergan , zofran and reglan reordered. -Reviewed with her that she does not need admission as she is not sick enough to be in-patient.  -Reviewed warning signs and when to return to MAU.   Mervyn Skeeters Krissia Schreier 10/12/2019, 1:22 PM

## 2019-10-12 NOTE — MAU Note (Signed)
Pt arrived via EMS c/o of nausea and vomiting for about a week. She has been taking the medication ordered for her. Also has had some vaginal spotting in the past, but not now. Denies cramping, pain,  and vaginal discharge.

## 2019-10-12 NOTE — Discharge Instructions (Signed)
Eating Plan for Pregnant Women While you are pregnant, your body requires additional nutrition to help support your growing baby. You also have a higher need for some vitamins and minerals, such as folic acid, calcium, iron, and vitamin D. Eating a healthy, well-balanced diet is very important for your health and your baby's health. Your need for extra calories varies for the three 53-monthsegments of your pregnancy (trimesters). For most women, it is recommended to consume:  150 extra calories a day during the first trimester.  300 extra calories a day during the second trimester.  300 extra calories a day during the third trimester. What are tips for following this plan?   Do not try to lose weight or go on a diet during pregnancy.  Limit your overall intake of foods that have "empty calories." These are foods that have little nutritional value, such as sweets, desserts, candies, and sugar-sweetened beverages.  Eat a variety of foods (especially fruits and vegetables) to get a full range of vitamins and minerals.  Take a prenatal vitamin to help meet your additional vitamin and mineral needs during pregnancy, specifically for folic acid, iron, calcium, and vitamin D.  Remember to stay active. Ask your health care provider what types of exercise and activities are safe for you.  Practice good food safety and cleanliness. Wash your hands before you eat and after you prepare raw meat. Wash all fruits and vegetables well before peeling or eating. Taking these actions can help to prevent food-borne illnesses that can be very dangerous to your baby, such as listeriosis. Ask your health care provider for more information about listeriosis. What does 150 extra calories look like? Healthy options that provide 150 extra calories each day could be any of the following:  6-8 oz (170-230 g) of plain low-fat yogurt with  cup of berries.  1 apple with 2 teaspoons (11 g) of peanut butter.  Cut-up  vegetables with  cup (60 g) of hummus.  8 oz (230 mL) or 1 cup of low-fat chocolate milk.  1 stick of string cheese with 1 medium orange.  1 peanut butter and jelly sandwich that is made with one slice of whole-wheat bread and 1 tsp (5 g) of peanut butter. For 300 extra calories, you could eat two of those healthy options each day. What is a healthy amount of weight to gain? The right amount of weight gain for you is based on your BMI before you became pregnant. If your BMI:  Was less than 18 (underweight), you should gain 28-40 lb (13-18 kg).  Was 18-24.9 (normal), you should gain 25-35 lb (11-16 kg).  Was 25-29.9 (overweight), you should gain 15-25 lb (7-11 kg).  Was 30 or greater (obese), you should gain 11-20 lb (5-9 kg). What if I am having twins or multiples? Generally, if you are carrying twins or multiples:  You may need to eat 300-600 extra calories a day.  The recommended range for total weight gain is 25-54 lb (11-25 kg), depending on your BMI before pregnancy.  Talk with your health care provider to find out about nutritional needs, weight gain, and exercise that is right for you. What foods can I eat?  Grains All grains. Choose whole grains, such as whole-wheat bread, oatmeal, or brown rice. Vegetables All vegetables. Eat a variety of colors and types of vegetables. Remember to wash your vegetables well before peeling or eating. Fruits All fruits. Eat a variety of colors and types of fruit. Remember to wash  your fruits well before peeling or eating. Meats and other protein foods Lean meats, including chicken, [place], fish, and lean cuts of beef, veal, or pork. If you eat fish or seafood, choose options that are higher in omega-3 fatty acids and lower in mercury, such as salmon, herring, mussels, trout, sardines, pollock, shrimp, crab, and lobster. Tofu. Tempeh. Beans. Eggs. Peanut butter and other nut butters. Make sure that all meats, poultry, and eggs are cooked to  food-safe temperatures or "well-done." Two or more servings of fish are recommended each week in order to get the most benefits from omega-3 fatty acids that are found in seafood. Choose fish that are lower in mercury. You can find more information online:  GuamGaming.ch Dairy Pasteurized milk and milk alternatives (such as almond milk). Pasteurized yogurt and pasteurized cheese. Cottage cheese. Sour cream. Beverages Water. Juices that contain 100% fruit juice or vegetable juice. Caffeine-free teas and decaffeinated coffee. Drinks that contain caffeine are okay to drink, but it is better to avoid caffeine. Keep your total caffeine intake to less than 200 mg each day (which is 12 oz or 355 mL of coffee, tea, or soda) or the limit as told by your health care provider. Fats and oils Fats and oils are okay to include in moderation. Sweets and desserts Sweets and desserts are okay to include in moderation. Seasoning and other foods All pasteurized condiments. The items listed above may not be a complete list of recommended foods and beverages. Contact your dietitian for more options. The items listed above may not be a complete list of foods and beverages [you/your child] can eat. Contact a dietitian for more information. What foods are not recommended? Vegetables Raw (unpasteurized) vegetable juices. Fruits Unpasteurized fruit juices. Meats and other protein foods Lunch meats, bologna, hot dogs, or other deli meats. (If you must eat those meats, reheat them until they are steaming hot.) Refrigerated pat, meat spreads from a meat counter, smoked seafood that is found in the refrigerated section of a store. Raw or undercooked meats, poultry, and eggs. Raw fish, such as sushi or sashimi. Fish that have high mercury content, such as tilefish, shark, swordfish, and king mackerel. To learn more about mercury in fish, talk with your health care provider or look for online resources, such  as:  [employer]Gaming.ch Dairy Raw (unpasteurized) milk and any foods that have raw milk in them. Soft cheeses, such as feta, queso blanco, queso fresco, Brie, Camembert cheeses, blue-veined cheeses, and Panela cheese (unless it is made with pasteurized milk, which must be stated on the label). Beverages Alcohol. Sugar-sweetened beverages, such as sodas, teas, or energy drinks. Seasoning and other foods Homemade fermented foods and drinks, such as pickles, sauerkraut, or kombucha drinks. (Store-bought pasteurized versions of these are okay.) Salads that are made in a store or deli, such as ham salad, chicken salad, egg salad, tuna salad, and seafood salad. The items listed above may not be a complete list of foods and beverages to avoid. Contact your dietitian for more information. The items listed above may not be a complete list of foods and beverages [you/your child] should avoid. Contact a dietitian for more information. Where to find more information To calculate the number of calories you need based on your height, weight, and activity level, you can use an online calculator such as:  MobileTransition.ch To calculate how much weight you should gain during pregnancy, you can use an online pregnancy weight gain calculator such as:  StreamingFood.com.cy Summary  While you  are pregnant, your body requires additional nutrition to help support your growing baby.  Eat a variety of foods, especially fruits and vegetables to get a full range of vitamins and minerals.  Practice good food safety and cleanliness. Wash your hands before you eat and after you prepare raw meat. Wash all fruits and vegetables well before peeling or eating. Taking these actions can help to prevent food-borne illnesses, such as listeriosis, that can be very dangerous to your baby.  Do not eat raw meat or fish. Do not eat fish that have high mercury content, such as tilefish,  shark, swordfish, and king mackerel. Do not eat unpasteurized (raw) dairy.  Take a prenatal vitamin to help meet your additional vitamin and mineral needs during pregnancy, specifically for folic acid, iron, calcium, and vitamin D. This information is not intended to replace advice given to you by your health care provider. Make sure you discuss any questions you have with your health care provider. Document Released: 09/15/2014 Document Revised: 03/24/2019 Document Reviewed: 08/28/2017 Elsevier Patient Education  2020 [employer].

## 2019-10-16 ENCOUNTER — Telehealth: Payer: Medicare Other

## 2019-10-21 ENCOUNTER — Telehealth: Payer: Self-pay | Admitting: *Deleted

## 2019-10-21 DIAGNOSIS — O219 Vomiting of pregnancy, unspecified: Secondary | ICD-10-CM

## 2019-10-21 MED ORDER — METOCLOPRAMIDE HCL 10 MG PO TABS: 10.0000 mg | ORAL_TABLET | Freq: Four times a day (QID) | ORAL | 1 refills | Status: DC | PRN

## 2019-10-21 NOTE — Telephone Encounter (Signed)
Refill request from pharmacy for Metoclopramide 10mg , d-120.  Please review and send refill to pharmacy if approved.

## 2019-10-24 ENCOUNTER — Ambulatory Visit (INDEPENDENT_AMBULATORY_CARE_PROVIDER_SITE_OTHER): Payer: Medicare Other | Admitting: Family Medicine

## 2019-10-24 ENCOUNTER — Other Ambulatory Visit: Payer: Self-pay

## 2019-10-24 VITALS — BP 116/84 | HR 108 | Wt 226.4 lb

## 2019-10-24 DIAGNOSIS — O09299 Supervision of pregnancy with other poor reproductive or obstetric history, unspecified trimester: Secondary | ICD-10-CM

## 2019-10-24 DIAGNOSIS — O09892 Supervision of other high risk pregnancies, second trimester: Secondary | ICD-10-CM

## 2019-10-24 DIAGNOSIS — O09899 Supervision of other high risk pregnancies, unspecified trimester: Secondary | ICD-10-CM

## 2019-10-24 DIAGNOSIS — Z3A16 16 weeks gestation of pregnancy: Secondary | ICD-10-CM

## 2019-10-24 DIAGNOSIS — Z8632 Personal history of gestational diabetes: Secondary | ICD-10-CM

## 2019-10-24 DIAGNOSIS — O09292 Supervision of pregnancy with other poor reproductive or obstetric history, second trimester: Secondary | ICD-10-CM

## 2019-10-24 DIAGNOSIS — O0990 Supervision of high risk pregnancy, unspecified, unspecified trimester: Secondary | ICD-10-CM
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
## 2019-10-25 ENCOUNTER — Encounter: Payer: Self-pay | Admitting: Obstetrics and Gynecology

## 2019-10-25 DIAGNOSIS — D696 Thrombocytopenia, unspecified: Secondary | ICD-10-CM | POA: Insufficient documentation

## 2019-10-25 DIAGNOSIS — O99119 Other diseases of the blood and blood-forming organs and certain disorders involving the immune mechanism complicating pregnancy, unspecified trimester: Secondary | ICD-10-CM | POA: Insufficient documentation

## 2019-10-25 LAB — OBSTETRIC PANEL, INCLUDING HIV
Antibody Screen: NEGATIVE
Basophils Absolute: 0 10*3/uL (ref 0.0–0.2)
Basos: 0 %
EOS (ABSOLUTE): 0 10*3/uL (ref 0.0–0.4)
Eos: 0 %
HIV Screen 4th Generation wRfx: NONREACTIVE
Hematocrit: 39.1 % (ref 34.0–46.6)
Hemoglobin: 13.1 g/dL (ref 11.1–15.9)
Hepatitis B Surface Ag: NEGATIVE
Immature Grans (Abs): 0 10*3/uL (ref 0.0–0.1)
Immature Granulocytes: 0 %
Lymphocytes Absolute: 1.6 10*3/uL (ref 0.7–3.1)
Lymphs: 22 %
MCH: 30.9 pg (ref 26.6–33.0)
MCHC: 33.5 g/dL (ref 31.5–35.7)
MCV: 92 fL (ref 79–97)
Monocytes Absolute: 0.2 10*3/uL (ref 0.1–0.9)
Monocytes: 3 %
Neutrophils Absolute: 5.3 10*3/uL (ref 1.4–7.0)
Neutrophils: 75 %
Platelets: 145 10*3/uL — ABNORMAL LOW (ref 150–450)
RBC: 4.24 x10E6/uL (ref 3.77–5.28)
RDW: 12.9 % (ref 11.7–15.4)
RPR Ser Ql: NONREACTIVE
Rh Factor: POSITIVE
Rubella Antibodies, IGG: 3.27 index (ref >0.99)
WBC: 7.2 10*3/uL (ref 3.4–10.8)

## 2019-10-25 LAB — COMPREHENSIVE METABOLIC PANEL
ALT: 7 IU/L (ref 0–32)
AST: 12 IU/L (ref 0–40)
Albumin/Globulin Ratio: 1.6 (ref 1.2–2.2)
Albumin: 3.9 g/dL (ref 3.8–4.8)
Alkaline Phosphatase: 74 IU/L (ref 39–117)
BUN/Creatinine Ratio: 11 (ref 9–23)
BUN: 6 mg/dL (ref 6–20)
Bilirubin Total: 0.3 mg/dL (ref 0.0–1.2)
CO2: 22 mmol/L (ref 20–29)
Calcium: 8.6 mg/dL — ABNORMAL LOW (ref 8.7–10.2)
Chloride: 101 mmol/L (ref 96–106)
Creatinine, Ser: 0.57 mg/dL (ref 0.57–1.00)
GFR calc Af Amer: 142 mL/min/{1.73_m2} (ref >59)
GFR calc non Af Amer: 123 mL/min/{1.73_m2} (ref >59)
Globulin, Total: 2.5 g/dL (ref 1.5–4.5)
Glucose: 84 mg/dL (ref 65–99)
Potassium: 3.8 mmol/L (ref 3.5–5.2)
Sodium: 138 mmol/L (ref 134–144)
Total Protein: 6.4 g/dL (ref 6.0–8.5)

## 2019-10-25 LAB — HEMOGLOBIN A1C
Est. average glucose Bld gHb Est-mCnc: 100 mg/dL
Hgb A1c MFr Bld: 5.1 % (ref 4.8–5.6)

## 2019-10-25 LAB — TSH: TSH: 1.32 u[IU]/mL (ref 0.450–4.500)

## 2019-10-25 LAB — PROTEIN / CREATININE RATIO, URINE
Creatinine, Urine: 402.5 mg/dL
Protein, Ur: 38.1 mg/dL
Protein/Creat Ratio: 95 mg/g creat (ref 0–200)

## 2019-10-26 LAB — URINE CULTURE, OB REFLEX

## 2019-10-26 LAB — CULTURE, OB URINE

## 2019-10-31 ENCOUNTER — Encounter: Payer: Self-pay | Admitting: *Deleted

## 2019-11-01 ENCOUNTER — Telehealth: Payer: Self-pay | Admitting: Family Medicine

## 2019-11-01 NOTE — Telephone Encounter (Signed)
Patient called to received her natera results. Patient was instructed that they are not back yet but when they are they will be released to her mychart and she can check there. Patient stated that she was told that they would be back in a week. I apologized for the wrong information given out and re inform of the results taking a couple of weeks. Patient hung up when this information was given to her.

## 2019-11-04 ENCOUNTER — Ambulatory Visit (HOSPITAL_COMMUNITY): Payer: Medicare Other | Admitting: *Deleted

## 2019-11-04 ENCOUNTER — Other Ambulatory Visit: Payer: Self-pay

## 2019-11-04 ENCOUNTER — Telehealth: Payer: Self-pay | Admitting: Family Medicine

## 2019-11-04 ENCOUNTER — Ambulatory Visit (HOSPITAL_COMMUNITY)
Admission: RE | Admit: 2019-11-04 | Discharge: 2019-11-04 | Disposition: A | Discharge status: Home / Self Care | Payer: Medicare Other | Source: Ambulatory Visit | Attending: Obstetrics and Gynecology | Admitting: Obstetrics and Gynecology

## 2019-11-04 ENCOUNTER — Encounter (HOSPITAL_COMMUNITY): Payer: Self-pay

## 2019-11-04 ENCOUNTER — Other Ambulatory Visit (HOSPITAL_COMMUNITY): Payer: Self-pay | Admitting: *Deleted

## 2019-11-04 DIAGNOSIS — O09892 Supervision of other high risk pregnancies, second trimester: Secondary | ICD-10-CM

## 2019-11-04 DIAGNOSIS — Z3A18 18 weeks gestation of pregnancy: Secondary | ICD-10-CM

## 2019-11-04 DIAGNOSIS — O09299 Supervision of pregnancy with other poor reproductive or obstetric history, unspecified trimester: Secondary | ICD-10-CM

## 2019-11-04 DIAGNOSIS — Z8632 Personal history of gestational diabetes: Secondary | ICD-10-CM | POA: Diagnosis not present

## 2019-11-04 DIAGNOSIS — O09292 Supervision of pregnancy with other poor reproductive or obstetric history, second trimester: Secondary | ICD-10-CM

## 2019-11-04 DIAGNOSIS — O0990 Supervision of high risk pregnancy, unspecified, unspecified trimester: Secondary | ICD-10-CM | POA: Diagnosis present

## 2019-11-04 DIAGNOSIS — O09899 Supervision of other high risk pregnancies, unspecified trimester: Secondary | ICD-10-CM

## 2019-11-04 DIAGNOSIS — O99212 Obesity complicating pregnancy, second trimester: Secondary | ICD-10-CM

## 2019-11-04 NOTE — Telephone Encounter (Signed)
Patient is requesting a call back she have some questions about her lab results

## 2019-11-07 ENCOUNTER — Telehealth: Payer: Self-pay | Admitting: Family Medicine

## 2019-11-07 ENCOUNTER — Encounter: Payer: Self-pay | Admitting: *Deleted

## 2019-11-07 NOTE — Telephone Encounter (Signed)
Patient is requesting a call back from the nurse. She stated she was not able to get her results, and just want to know if she is having a boy or girl. Morey Hummingbird RN stated she could come to the office and get a hardcopy. She said she would try to come, but would still like a call back from the nurse.

## 2019-11-07 NOTE — Telephone Encounter (Signed)
Patient has called several times today. She is upset because the results can not be given over the phone about the sex of her baby. She said she tried to download the results, but they went away and she is not able to find them. She hangs up when not given her the answer she wants.

## 2019-11-07 NOTE — Telephone Encounter (Signed)
Per review, [facility] Haws test resulted over the weekend and have not been scanned into mychart account yet. [facility] website and results have to be manually released by [place]. Released results to patient.   Called patient & explained result process to her and apologized for any confusion. Patient verbalized understanding & states she just saw her results on the [facility] website. Patient states she is still coming by the office to pick up results. Told patient that is perfectly fine. Patient asked if it was true we cannot give gender results over the phone & I stated yes that is correct. Patient verbalized understanding & asked what the fetal fraction number was. Told patient that is the percentage of baby's DNA visible in maternal blood- discussed it is just a number for the lab and does not mean anything for you. Patient verbalized understanding & had no other questions.

## 2019-11-14 ENCOUNTER — Ambulatory Visit (HOSPITAL_COMMUNITY): Payer: Medicare Other

## 2019-11-17 ENCOUNTER — Encounter: Payer: Self-pay | Admitting: *Deleted

## 2019-11-18 ENCOUNTER — Encounter: Payer: Self-pay | Admitting: *Deleted

## 2019-11-21 ENCOUNTER — Encounter: Payer: Medicare Other | Admitting: Obstetrics and Gynecology

## 2019-12-02 ENCOUNTER — Encounter (HOSPITAL_COMMUNITY): Payer: Self-pay

## 2019-12-02 ENCOUNTER — Other Ambulatory Visit: Payer: Self-pay

## 2019-12-02 ENCOUNTER — Ambulatory Visit (HOSPITAL_COMMUNITY): Payer: Medicare Other | Admitting: *Deleted

## 2019-12-02 ENCOUNTER — Ambulatory Visit (HOSPITAL_COMMUNITY)
Admission: RE | Admit: 2019-12-02 | Discharge: 2019-12-02 | Disposition: A | Discharge status: Home / Self Care | Payer: Medicare Other | Source: Ambulatory Visit | Attending: Obstetrics and Gynecology | Admitting: Obstetrics and Gynecology

## 2019-12-02 DIAGNOSIS — O09292 Supervision of pregnancy with other poor reproductive or obstetric history, second trimester: Secondary | ICD-10-CM

## 2019-12-02 DIAGNOSIS — O09892 Supervision of other high risk pregnancies, second trimester: Secondary | ICD-10-CM

## 2019-12-02 DIAGNOSIS — O0990 Supervision of high risk pregnancy, unspecified, unspecified trimester: Secondary | ICD-10-CM

## 2019-12-02 DIAGNOSIS — O99212 Obesity complicating pregnancy, second trimester: Secondary | ICD-10-CM | POA: Diagnosis present

## 2019-12-02 DIAGNOSIS — Z3A22 22 weeks gestation of pregnancy: Secondary | ICD-10-CM

## 2019-12-05 ENCOUNTER — Encounter: Payer: Medicare Other | Admitting: Obstetrics and Gynecology

## 2019-12-05 ENCOUNTER — Other Ambulatory Visit (HOSPITAL_COMMUNITY): Payer: Self-pay | Admitting: *Deleted

## 2019-12-05 DIAGNOSIS — O99213 Obesity complicating pregnancy, third trimester: Secondary | ICD-10-CM

## 2019-12-08 ENCOUNTER — Other Ambulatory Visit: Payer: Medicare Other

## 2019-12-16 NOTE — L&D Delivery Note (Signed)
Patient is a 34 y.o. now G6P5 s/p NSVD at [redacted]w[redacted]d, who was admitted for IOL for GHTN.  She progressed with augmentation (pitocin/AROM) to complete and pushed <2 minutes to deliver.  Cord clamping delayed by several minutes then clamped by CNM and cut by [name].  Placenta intact and spontaneous, bleeding minimal. Mom and baby stable prior to transfer to postpartum. She plans on breastfeeding. She requests Nexplanon for birth control.  Delivery Note At 9:47 PM a viable and healthy female was delivered via Vaginal, Spontaneous (Presentation: Right Occiput Anterior).  APGAR: 9, 9; weight  .   Placenta status: Spontaneous, Intact.  Cord: 3 vessels with the following complications: None.   Anesthesia: Epidural Episiotomy: None Lacerations: None Suture Repair: none Est. Blood Loss (mL): 100  Mom to postpartum.  Baby to Couplet care / Skin to Skin.  Lajean Manes CNM 03/16/2020, 10:12 PM

## 2019-12-19 ENCOUNTER — Other Ambulatory Visit: Payer: Self-pay

## 2019-12-19 ENCOUNTER — Telehealth (INDEPENDENT_AMBULATORY_CARE_PROVIDER_SITE_OTHER): Payer: Medicare Other | Admitting: Obstetrics & Gynecology

## 2019-12-19 ENCOUNTER — Telehealth: Payer: Self-pay | Admitting: Family Medicine

## 2019-12-19 DIAGNOSIS — O09292 Supervision of pregnancy with other poor reproductive or obstetric history, second trimester: Secondary | ICD-10-CM

## 2019-12-19 DIAGNOSIS — O0992 Supervision of high risk pregnancy, unspecified, second trimester: Secondary | ICD-10-CM

## 2019-12-19 DIAGNOSIS — O0990 Supervision of high risk pregnancy, unspecified, unspecified trimester: Secondary | ICD-10-CM

## 2019-12-19 DIAGNOSIS — O09299 Supervision of pregnancy with other poor reproductive or obstetric history, unspecified trimester: Secondary | ICD-10-CM

## 2019-12-19 DIAGNOSIS — Z3A24 24 weeks gestation of pregnancy: Secondary | ICD-10-CM

## 2019-12-19 NOTE — Progress Notes (Signed)
   TELEHEALTH VIRTUAL OBSTETRICS VISIT ENCOUNTER NOTE  I connected with [place] on 12/19/19 at  2:55 PM EST by telephone at home and verified that I am speaking with the correct person using two identifiers.   I discussed the limitations, risks, security and privacy concerns of performing an evaluation and management service by telephone and the availability of in person appointments. I also discussed with the patient that there may be a patient responsible charge related to this service. The patient expressed understanding and agreed to proceed.  Subjective:  [name] is a 34 y.o. [id number] at [redacted]w[redacted]d being followed for ongoing prenatal care.  She is currently monitored for the following issues for this high-risk pregnancy and has Borderline behavior; GERD (gastroesophageal reflux disease); Bipolar affective disorder, depressed, moderate degree ([place]); Obesity (BMI 30-39.9); OSA (obstructive sleep apnea); Vaginal venereal warts; Anxiety state; Asthma; Consecutive esotropia; Intellectual disability; ADHD (attention deficit hyperactivity disorder), inattentive type; Hx of preeclampsia, prior pregnancy, currently pregnant; History of preterm delivery after IOL for preeclampsia, currently pregnant; History of gestational diabetes in prior pregnancy, currently pregnant; Short interval between pregnancies affecting pregnancy, antepartum; Supervision of high risk pregnancy, antepartum; Nausea and vomiting during pregnancy; and Thrombocytopenia affecting pregnancy ([place]) on their problem list.  Patient reports no complaints. Reports fetal movement. Denies any contractions, bleeding or leaking of fluid.   The following portions of the patient's history were reviewed and updated as appropriate: allergies, current medications, past family history, past medical history, past social history, past surgical history and problem list.   Objective:   General:  Alert, oriented and cooperative.   Mental  Status: Normal mood and affect perceived. Normal judgment and thought content.  Rest of physical exam deferred due to type of encounter  Assessment and Plan:  Pregnancy: [id number] at [redacted]w[redacted]d There are no diagnoses linked to this encounter 34. Preterm labor symptoms and general obstetric precautions including but not limited to vaginal bleeding, contractions, leaking of fluid and fetal movement were reviewed in detail with the patient.  I discussed the assessment and treatment plan with the patient. The patient was provided an opportunity to ask questions and all were answered. The patient agreed with the plan and demonstrated an understanding of the instructions. The patient was advised to call back or seek an in-person office evaluation/go to [facility] at [facility] for any urgent or concerning symptoms. Please refer to After Visit Summary for other counseling recommendations.   I provided 12 minutes of non-face-to-face time during this encounter.  No follow-ups on file.  Future Appointments  Date Time Provider [place]  01/13/2020  3:30 PM [place] NURSE [place] [facility]  01/13/2020  3:30 PM [place]    Emeterio Reeve, [place] for [place]

## 2019-12-19 NOTE — Progress Notes (Signed)
Pt states does not have access to BP [facility] right now, asked pt once she has access if she could take BP & record into [facility]. Pt verbalized understanding.

## 2019-12-19 NOTE — Telephone Encounter (Signed)
The patient called in stating she was told to have covid19 testing however she did not go because her symptoms are getting better. Stated she wanted to know if she could still come in? After consulting with nurse Mel Almond the patient was informed the visit would be changed to a mychart visit and she still need to have covid testing. The patient stated she is not going to go get tested. Informed the patient I will make the nurse aware and the patient can discuss her concerns on the mychart visit today. Educated of expecting a call around the appointment time with information on how and when to log in.

## 2019-12-19 NOTE — Patient Instructions (Signed)
Second Trimester of Pregnancy  The second trimester is from week 14 through week 27 (month 4 through 6). This is often the time in pregnancy that you feel your best. Often times, morning sickness has lessened or quit. You may have more energy, and you may get hungry more often. Your unborn baby is growing rapidly. At the end of the sixth month, he or she is about 9 inches long and weighs about 1 pounds. You will likely feel the baby move between 18 and 20 weeks of pregnancy. Follow these instructions at home: Medicines  Take over-the-counter and prescription medicines only as told by your doctor. Some medicines are safe and some medicines are not safe during pregnancy.  Take a prenatal vitamin that contains at least 600 micrograms (mcg) of folic acid.  If you have trouble pooping (constipation), take medicine that will make your stool soft (stool softener) if your doctor approves. Eating and drinking   Eat regular, healthy meals.  Avoid raw meat and uncooked cheese.  If you get low calcium from the food you eat, talk to your doctor about taking a daily calcium supplement.  Avoid foods that are high in fat and sugars, such as fried and sweet foods.  If you feel sick to your stomach (nauseous) or throw up (vomit): ? Eat 4 or 5 small meals a day instead of 3 large meals. ? Try eating a few soda crackers. ? Drink liquids between meals instead of during meals.  To prevent constipation: ? Eat foods that are high in fiber, like fresh fruits and vegetables, whole grains, and beans. ? Drink enough fluids to keep your pee (urine) clear or pale yellow. Activity  Exercise only as told by your doctor. Stop exercising if you start to have cramps.  Do not exercise if it is too hot, too humid, or if you are in a place of great height (high altitude).  Avoid heavy lifting.  Wear low-heeled shoes. Sit and stand up straight.  You can continue to have sex unless your doctor tells you not  to. Relieving pain and discomfort  Wear a good support bra if your breasts are tender.  Take warm water baths (sitz baths) to soothe pain or discomfort caused by hemorrhoids. Use hemorrhoid cream if your doctor approves.  Rest with your legs raised if you have leg cramps or low back pain.  If you develop puffy, bulging veins (varicose veins) in your legs: ? Wear support hose or compression stockings as told by your doctor. ? Raise (elevate) your feet for 15 minutes, 3-4 times a day. ? Limit salt in your food. Prenatal care  Write down your questions. Take them to your prenatal visits.  Keep all your prenatal visits as told by your doctor. This is important. Safety  Wear your seat belt when driving.  Make a list of emergency phone numbers, including numbers for family, friends, the hospital, and police and fire departments. General instructions  Ask your doctor about the right foods to eat or for help finding a counselor, if you need these services.  Ask your doctor about local prenatal classes. Begin classes before month 6 of your pregnancy.  Do not use hot tubs, steam rooms, or saunas.  Do not douche or use tampons or scented sanitary pads.  Do not cross your legs for long periods of time.  Visit your dentist if you have not done so. Use a soft toothbrush to brush your teeth. Floss gently.  Avoid all smoking, herbs,   and alcohol. Avoid drugs that are not approved by your doctor.  Do not use any products that contain nicotine or tobacco, such as cigarettes and e-cigarettes. If you need help quitting, ask your doctor.  Avoid cat litter boxes and soil used by cats. These carry germs that can cause birth defects in the baby and can cause a loss of your baby (miscarriage) or stillbirth. Contact a doctor if:  You have mild cramps or pressure in your lower belly.  You have pain when you pee (urinate).  You have bad smelling fluid coming from your vagina.  You continue to  feel sick to your stomach (nauseous), throw up (vomit), or have watery poop (diarrhea).  You have a nagging pain in your belly area.  You feel dizzy. Get help right away if:  You have a fever.  You are leaking fluid from your vagina.  You have spotting or bleeding from your vagina.  You have severe belly cramping or pain.  You lose or gain weight rapidly.  You have trouble catching your breath and have chest pain.  You notice sudden or extreme puffiness (swelling) of your face, hands, ankles, feet, or legs.  You have not felt the baby move in over an hour.  You have severe headaches that do not go away when you take medicine.  You have trouble seeing. Summary  The second trimester is from week 14 through week 27 (months 4 through 6). This is often the time in pregnancy that you feel your best.  To take care of yourself and your unborn baby, you will need to eat healthy meals, take medicines only if your doctor tells you to do so, and do activities that are safe for you and your baby.  Call your doctor if you get sick or if you notice anything unusual about your pregnancy. Also, call your doctor if you need help with the right food to eat, or if you want to know what activities are safe for you. This information is not intended to replace advice given to you by your health care provider. Make sure you discuss any questions you have with your health care provider. Document Revised: 03/25/2019 Document Reviewed: 01/06/2017 Elsevier Patient Education  2020 Elsevier Inc[employer]  

## 2019-12-19 NOTE — Progress Notes (Signed)
3:10p- Called pt for My Chart visit, no answer, immediately rcvd message stating pt had no VM set up & then disconnected.Will retry in 10 to 15 minutes.  I connected with  [name] on 12/19/19 at  2:55 PM EST by telephone and verified that I am speaking with the correct person using two identifiers.   I discussed the limitations, risks, security and privacy concerns of performing an evaluation and management service by telephone and the availability of in person appointments. I also discussed with the patient that there may be a patient responsible charge related to this service. The patient expressed understanding and agreed to proceed.  [name], [place] 12/19/2019  3:43 PM

## 2020-01-02 ENCOUNTER — Telehealth: Payer: Self-pay | Admitting: Obstetrics and Gynecology

## 2020-01-02 NOTE — Telephone Encounter (Signed)
error 

## 2020-01-02 NOTE — Telephone Encounter (Signed)
Received call from baby scripts that BP was 140/114. Called patient who is asymptomatic. She is unable to retake BP now. She is unable to present to MAU for pre-eclamptic workup. She is agreeable to coming to office in next day or two for repeat BP check, will have office call to schedule this.    Feliz Beam, M.D. Attending Center for [employer] [facility] manager)

## 2020-01-03 ENCOUNTER — Telehealth: Payer: Self-pay | Admitting: Family Medicine

## 2020-01-03 NOTE — Telephone Encounter (Addendum)
Called pt to follow up on conversation with front office staff. Pt needs BP check per Rosana Hoes, MD but states she will not come into the office until 01/13/20 for previously scheduled appt. Called pt to see if she would be able to check BP at home. VM left stating I was calling to follow-up on prior conversation and requested a call back. Offered for pt to respond via MyChart instead. Responded to pt's prior MyChart message.

## 2020-01-03 NOTE — Telephone Encounter (Signed)
Patient called in stated that she was scheduled for an appointment that she knew nothing about. Patient instructed that I scheduled the appointment for her and sent a mychart message about the reason why it was scheduled. Patient stated that she should have been called instead. Patient stated that she can not come in tomorrow or any other day to have her bp checked. She will not be coming in until her appointment on 1/29.

## 2020-01-04 ENCOUNTER — Ambulatory Visit: Payer: Medicare Other

## 2020-01-10 ENCOUNTER — Encounter (HOSPITAL_COMMUNITY): Payer: Self-pay

## 2020-01-11 ENCOUNTER — Other Ambulatory Visit: Payer: Self-pay | Admitting: Lactation Services

## 2020-01-11 DIAGNOSIS — O0990 Supervision of high risk pregnancy, unspecified, unspecified trimester: Secondary | ICD-10-CM

## 2020-01-12 ENCOUNTER — Other Ambulatory Visit: Payer: Self-pay | Admitting: General Practice

## 2020-01-12 DIAGNOSIS — O219 Vomiting of pregnancy, unspecified: Secondary | ICD-10-CM

## 2020-01-12 MED ORDER — PROMETHAZINE HCL 25 MG PO TABS: 25.0000 mg | ORAL_TABLET | Freq: Four times a day (QID) | ORAL | 0 refills | Status: DC | PRN

## 2020-01-13 ENCOUNTER — Ambulatory Visit: Payer: Medicare Other

## 2020-01-13 ENCOUNTER — Ambulatory Visit (HOSPITAL_COMMUNITY): Payer: Medicare Other

## 2020-01-13 ENCOUNTER — Ambulatory Visit (HOSPITAL_COMMUNITY): Admission: RE | Admit: 2020-01-13 | Payer: Medicare Other | Source: Ambulatory Visit

## 2020-01-13 ENCOUNTER — Encounter: Payer: Medicare Other | Admitting: Family Medicine

## 2020-01-13 ENCOUNTER — Other Ambulatory Visit: Payer: Medicare Other

## 2020-01-16 DIAGNOSIS — O26893 Other specified pregnancy related conditions, third trimester: Secondary | ICD-10-CM

## 2020-01-16 MED ORDER — FAMOTIDINE 20 MG PO TABS: 20.0000 mg | ORAL_TABLET | Freq: Two times a day (BID) | ORAL | 1 refills | Status: DC

## 2020-01-20 ENCOUNTER — Ambulatory Visit (HOSPITAL_COMMUNITY)
Admission: RE | Admit: 2020-01-20 | Discharge: 2020-01-20 | Disposition: A | Discharge status: Home / Self Care | Payer: Medicare Other | Source: Ambulatory Visit | Attending: Obstetrics | Admitting: Obstetrics

## 2020-01-20 ENCOUNTER — Other Ambulatory Visit: Payer: Self-pay

## 2020-01-20 ENCOUNTER — Ambulatory Visit (HOSPITAL_COMMUNITY)
Admission: RE | Admit: 2020-01-20 | Payer: Medicare Other | Source: Ambulatory Visit | Attending: Obstetrics | Admitting: Obstetrics

## 2020-01-20 ENCOUNTER — Other Ambulatory Visit (HOSPITAL_COMMUNITY): Payer: Self-pay | Admitting: *Deleted

## 2020-01-20 ENCOUNTER — Encounter (HOSPITAL_COMMUNITY): Payer: Self-pay | Admitting: *Deleted

## 2020-01-20 ENCOUNTER — Ambulatory Visit (HOSPITAL_COMMUNITY): Payer: Medicare Other

## 2020-01-20 ENCOUNTER — Ambulatory Visit (HOSPITAL_COMMUNITY): Payer: Medicare Other | Admitting: *Deleted

## 2020-01-20 DIAGNOSIS — O0990 Supervision of high risk pregnancy, unspecified, unspecified trimester: Secondary | ICD-10-CM | POA: Insufficient documentation

## 2020-01-20 DIAGNOSIS — O99210 Obesity complicating pregnancy, unspecified trimester: Secondary | ICD-10-CM

## 2020-01-20 DIAGNOSIS — Z3A29 29 weeks gestation of pregnancy: Secondary | ICD-10-CM | POA: Diagnosis not present

## 2020-01-20 DIAGNOSIS — O09213 Supervision of pregnancy with history of pre-term labor, third trimester: Secondary | ICD-10-CM | POA: Diagnosis not present

## 2020-01-20 DIAGNOSIS — O139 Gestational [pregnancy-induced] hypertension without significant proteinuria, unspecified trimester: Secondary | ICD-10-CM | POA: Diagnosis not present

## 2020-01-20 DIAGNOSIS — O09299 Supervision of pregnancy with other poor reproductive or obstetric history, unspecified trimester: Secondary | ICD-10-CM

## 2020-01-20 DIAGNOSIS — Z362 Encounter for other antenatal screening follow-up: Secondary | ICD-10-CM | POA: Diagnosis not present

## 2020-01-20 DIAGNOSIS — O09893 Supervision of other high risk pregnancies, third trimester: Secondary | ICD-10-CM | POA: Diagnosis not present

## 2020-01-20 DIAGNOSIS — O09219 Supervision of pregnancy with history of pre-term labor, unspecified trimester: Secondary | ICD-10-CM

## 2020-01-20 DIAGNOSIS — Z3A34 34 weeks gestation of pregnancy: Secondary | ICD-10-CM

## 2020-01-20 DIAGNOSIS — O99213 Obesity complicating pregnancy, third trimester: Secondary | ICD-10-CM | POA: Diagnosis present

## 2020-01-23 ENCOUNTER — Encounter: Payer: Medicare Other | Admitting: Obstetrics & Gynecology

## 2020-01-23 ENCOUNTER — Other Ambulatory Visit: Payer: Medicare Other

## 2020-01-24 ENCOUNTER — Encounter: Payer: Medicare Other | Admitting: Obstetrics and Gynecology

## 2020-01-24 ENCOUNTER — Other Ambulatory Visit: Payer: Medicare Other

## 2020-01-25 NOTE — Telephone Encounter (Signed)
Pt called front office. Call transferred to clinical staff for question regarding patient's due date. Addressed pt's concerns over the phone. Explained possible reasoning for recent due date change and explained that a provider at her next appt will be able to go over this information with her again if needed.

## 2020-01-26 ENCOUNTER — Other Ambulatory Visit: Payer: Medicare Other

## 2020-01-26 ENCOUNTER — Encounter: Payer: Medicare Other | Admitting: Obstetrics & Gynecology

## 2020-01-27 ENCOUNTER — Other Ambulatory Visit: Payer: Self-pay

## 2020-01-27 ENCOUNTER — Encounter (HOSPITAL_COMMUNITY): Payer: Self-pay | Admitting: Obstetrics & Gynecology

## 2020-01-27 ENCOUNTER — Other Ambulatory Visit: Payer: Medicare Other

## 2020-01-27 ENCOUNTER — Inpatient Hospital Stay (HOSPITAL_COMMUNITY)
Admission: AD | Admit: 2020-01-27 | Discharge: 2020-01-27 | Disposition: A | Discharge status: Home / Self Care | Payer: Medicare Other | Attending: Obstetrics & Gynecology | Admitting: Obstetrics & Gynecology

## 2020-01-27 ENCOUNTER — Encounter: Payer: Self-pay | Admitting: Family Medicine

## 2020-01-27 ENCOUNTER — Encounter: Payer: Medicare Other | Admitting: Family Medicine

## 2020-01-27 DIAGNOSIS — F319 Bipolar disorder, unspecified: Secondary | ICD-10-CM | POA: Diagnosis not present

## 2020-01-27 DIAGNOSIS — R102 Pelvic and perineal pain: Secondary | ICD-10-CM | POA: Diagnosis not present

## 2020-01-27 DIAGNOSIS — O99613 Diseases of the digestive system complicating pregnancy, third trimester: Secondary | ICD-10-CM | POA: Insufficient documentation

## 2020-01-27 DIAGNOSIS — Z3A30 30 weeks gestation of pregnancy: Secondary | ICD-10-CM | POA: Diagnosis not present

## 2020-01-27 DIAGNOSIS — O133 Gestational [pregnancy-induced] hypertension without significant proteinuria, third trimester: Secondary | ICD-10-CM | POA: Diagnosis not present

## 2020-01-27 DIAGNOSIS — Z7982 Long term (current) use of aspirin: Secondary | ICD-10-CM | POA: Diagnosis not present

## 2020-01-27 DIAGNOSIS — Z79899 Other long term (current) drug therapy: Secondary | ICD-10-CM | POA: Insufficient documentation

## 2020-01-27 DIAGNOSIS — Z20822 Contact with and (suspected) exposure to covid-19: Secondary | ICD-10-CM | POA: Diagnosis not present

## 2020-01-27 DIAGNOSIS — O99343 Other mental disorders complicating pregnancy, third trimester: Secondary | ICD-10-CM | POA: Insufficient documentation

## 2020-01-27 DIAGNOSIS — O0993 Supervision of high risk pregnancy, unspecified, third trimester: Secondary | ICD-10-CM

## 2020-01-27 DIAGNOSIS — K219 Gastro-esophageal reflux disease without esophagitis: Secondary | ICD-10-CM | POA: Diagnosis not present

## 2020-01-27 DIAGNOSIS — N949 Unspecified condition associated with female genital organs and menstrual cycle: Secondary | ICD-10-CM | POA: Diagnosis not present

## 2020-01-27 DIAGNOSIS — O26893 Other specified pregnancy related conditions, third trimester: Secondary | ICD-10-CM | POA: Diagnosis present

## 2020-01-27 DIAGNOSIS — Z888 Allergy status to other drugs, medicaments and biological substances status: Secondary | ICD-10-CM | POA: Diagnosis not present

## 2020-01-27 DIAGNOSIS — O0990 Supervision of high risk pregnancy, unspecified, unspecified trimester: Secondary | ICD-10-CM

## 2020-01-27 MED ORDER — COMFORT FIT MATERNITY SUPP LG MISC: 1.0000 [IU] | Freq: Every day | 0 refills | Status: DC | PRN

## 2020-01-27 NOTE — MAU Note (Signed)
Presents with c/o pelvic pressure and ctxs, states the baby's dropped.  Denies VB or LOF.

## 2020-01-27 NOTE — MAU Provider Note (Signed)
History     CSN: [id number]  Arrival date and time: 01/27/20 1303   First Provider Initiated Contact with Patient 01/27/20 1346      Chief Complaint  Patient presents with  . Pelvic Pressure   HPI  [name] is a 34 y.o. [id number] at 19w0dwho presents to MAU today with complaint of pelvic pressure. She states increased pressure x 3-4 days. She denies vaginal bleeding, LOF or abnormal discharge. She does not feel like she is having contractions. She reports normal fetal movement.   Her routine OB appointment today was cancelled because she reported recent fever. She was instructed to get a COVID test. She states that [facility] and results should be back in 3 days.   OB History    Gravida  6   Para  4   Term  3   Preterm  1   AB  1   Living  3     SAB  1   TAB      Ectopic      Multiple  0   Live Births  3           Past Medical History:  Diagnosis Date  . Anxiety   . Asthma    inhaler PRN-hasn't used inhaler in months  . Attention deficit hyperactivity disorder   . Bipolar disorder ([facility]    hospitalized as a teen for suicidal ideations and cutting  . Depression    no complaints, doing well  . Gastritis   . GERD (gastroesophageal reflux disease)   . HPV (human papilloma virus) anogenital infection   . Hx of varicella   . Mild preeclampsia 02/09/2016  . Pregnancy induced hypertension   . Vaginal Pap smear, abnormal     Past Surgical History:  Procedure Laterality Date  . EYE MUSCLE SURGERY Bilateral    07/29/12  . MOUTH SURGERY    . PLANTAR FASCIA SURGERY    . WISDOM TOOTH EXTRACTION      Family History  Problem Relation Age of Onset  . Depression Mother   . Colon cancer Mother   . Colon polyps Mother   . Depression Father   . Lung cancer Father   . Depression Brother   . Liver disease Other   . Kidney disease Other     Social History   Tobacco Use  . Smoking status: Never Smoker  . Smokeless  tobacco: Never Used  Substance Use Topics  . Alcohol use: No    Alcohol/week: 0.0 standard drinks  . Drug use: No    Allergies:  Allergies  Allergen Reactions  . Depakote [Divalproex Sodium] Other (See Comments)    Pt states that this medication makes her blood levels toxic.    .Marland KitchenMethylphenidate Derivatives Other (See Comments)    Reaction:  Depression and anger   . Neurontin [Gabapentin] Other (See Comments)    Reaction:  Dizziness   . Prozac [Fluoxetine Hcl] Other (See Comments)    Reaction:  Anger   . Methylphenidate Hcl     SAME AS PREVIOUS LISTED METHYLPHENIDATE    Medications Prior to Admission  Medication Sig Dispense Refill Last Dose  . aspirin EC 81 MG tablet Take 1 tablet (81 mg total) by mouth daily. Take after 12 weeks for prevention of preeclampsia later in pregnancy 300 tablet 2 01/27/2020 at Unknown time  . famotidine (PEPCID) 20 MG tablet Take 1 tablet (20 mg total) by mouth  2 (two) times daily. 60 tablet 1 01/26/2020 at Unknown time  . Prenatal Vit w/Fe-Methylfol-FA (PNV PO) Take by mouth.   01/26/2020 at Unknown time  . promethazine (PHENERGAN) 25 MG tablet Take 1 tablet (25 mg total) by mouth every 6 (six) hours as needed for nausea or vomiting. 30 tablet 0 01/26/2020 at Unknown time  . QUEtiapine (SEROQUEL) 25 MG tablet Take 25 mg by mouth at bedtime.   01/26/2020 at Unknown time  . Sertraline HCl (ZOLOFT PO) Take 10 mg by mouth at bedtime.   01/26/2020 at Unknown time  . Blood Pressure Monitoring (BLOOD PRESSURE KIT) DEVI 1 Device by Does not apply route as needed. ICD 10 O09.90 1 Device 0   . metoCLOPramide (REGLAN) 10 MG tablet Take 1 tablet (10 mg total) by mouth every 6 (six) hours as needed for nausea or vomiting. (Patient not taking: Reported on 01/20/2020) 120 tablet 1   . ondansetron (ZOFRAN ODT) 4 MG disintegrating tablet Take 2 tablets (8 mg total) by mouth every 6 (six) hours as needed for nausea. (Patient not taking: Reported on 01/20/2020) 30 tablet 1      Review of Systems  Constitutional: Negative for fever.  Gastrointestinal: Positive for abdominal pain. Negative for constipation, diarrhea, nausea and vomiting.  Genitourinary: Positive for vaginal discharge. Negative for dysuria, frequency, urgency and vaginal bleeding.   Physical Exam   Blood pressure (!) 116/57, pulse (!) 109, temperature 98.1 F (36.7 C), resp. rate 18, last menstrual period 06/25/2019, SpO2 96 %, unknown if currently breastfeeding.  Physical Exam  Nursing note and vitals reviewed. Constitutional: She is oriented to person, place, and time. She appears well-developed and well-nourished. No distress.  HENT:  Head: Normocephalic and atraumatic.  Cardiovascular: Tachycardia present.  Respiratory: Effort normal.  GI: Soft. She exhibits no distension and no mass. There is no abdominal tenderness. There is no rebound and no guarding.  Neurological: She is alert and oriented to person, place, and time.  Skin: Skin is warm and dry. No erythema.  Psychiatric: She has a normal mood and affect.  Dilation: 1 Effacement (%): Thick Cervical Position: Posterior Exam by:: Musa Rewerts, PA   Fetal Monitoring: Baseline: 145 bpm Variability: moderate Accelerations: 10 x 10 Decelerations: none Contractions: none  MAU Course  Procedures None  MDM No ctx on TOCO and patient does not endorse contractions Cervix: 1/thick/posterior/firm  Assessment and Plan  A: Pelvic pressure, third trimester   P:  Discharge home Rx for abdominal binder given to patient to take to [facility]. Information for [employer] given.  Preterm labor precautions discussed Patient advised to follow-up with [facility]. She will need to call to reschedule her missed appointment from today when [facility].  Patient may return to MAU as needed or if her condition were to change or worsen   Kerry Hough, PA-C 01/27/2020, 2:14 PM

## 2020-01-27 NOTE — Discharge Instructions (Signed)
Round Ligament Pain  The round ligament is a cord of muscle and tissue that helps support the uterus. It can become a source of pain during pregnancy if it becomes stretched or twisted as the baby grows. The pain usually begins in the second trimester (13-28 weeks) of pregnancy, and it can come and go until the baby is delivered. It is not a serious problem, and it does not cause harm to the baby. Round ligament pain is usually a short, sharp, and pinching pain, but it can also be a dull, lingering, and aching pain. The pain is felt in the lower side of the abdomen or in the groin. It usually starts deep in the groin and moves up to the outside of the hip area. The pain may occur when you:  Suddenly change position, such as quickly going from a sitting to standing position.  Roll over in bed.  Cough or sneeze.  Do physical activity. Follow these instructions at home:   Watch your condition for any changes.  When the pain starts, relax. Then try any of these methods to help with the pain: ? Sitting down. ? Flexing your knees up to your abdomen. ? Lying on your side with one pillow under your abdomen and another pillow between your legs. ? Sitting in a warm bath for 15-20 minutes or until the pain goes away.  Take over-the-counter and prescription medicines only as told by your health care provider.  Move slowly when you sit down or stand up.  Avoid long walks if they cause pain.  Stop or reduce your physical activities if they cause pain.  Keep all follow-up visits as told by your health care provider. This is important. Contact a health care provider if:  Your pain does not go away with treatment.  You feel pain in your back that you did not have before.  Your medicine is not helping. Get help right away if:  You have a fever or chills.  You develop uterine contractions.  You have vaginal bleeding.  You have nausea or vomiting.  You have diarrhea.  You have pain  when you urinate. Summary  Round ligament pain is felt in the lower abdomen or groin. It is usually a short, sharp, and pinching pain. It can also be a dull, lingering, and aching pain.  This pain usually begins in the second trimester (13-28 weeks). It occurs because the uterus is stretching with the growing baby, and it is not harmful to the baby.  You may notice the pain when you suddenly change position, when you cough or sneeze, or during physical activity.  Relaxing, flexing your knees to your abdomen, lying on one side, or taking a warm bath may help to get rid of the pain.  Get help from your health care provider if the pain does not go away or if you have vaginal bleeding, nausea, vomiting, diarrhea, or painful urination. This information is not intended to replace advice given to you by your health care provider. Make sure you discuss any questions you have with your health care provider. Document Revised: 05/19/2018 Document Reviewed: 05/19/2018 Elsevier Patient Education  2020 [address], [place], [place] [address] Phone: [phone]  Monday     8:30AM-5PM Tuesday 8:30AM-5PM Wednesday 8:30AM-5PM Thursday 8:30AM-5PM Friday  8:30AM-5PM Saturday Closed Sunday Closed

## 2020-01-28 ENCOUNTER — Telehealth: Payer: Self-pay | Admitting: Obstetrics and Gynecology

## 2020-01-28 NOTE — Telephone Encounter (Signed)
Received a call from [facility] regarding elevated BP 120/107. Contacted patient by phone who reports feeling well. She denies any HA, visual changes, RUQ/epigastric pain, nausea or emesis.  Patient was able to retake her blood pressure which was 111/96. Patient unable to come to MAU for labs and serial BP. Reviewed si/sx of preeclampsia with patient. Patient awaiting covid test results and was advised to come to the office on 01/30/20 for BP check and to bring her cuff to ensure it is adequately calibrated

## 2020-01-30 ENCOUNTER — Other Ambulatory Visit: Payer: Medicare Other

## 2020-01-30 ENCOUNTER — Encounter: Payer: Medicare Other | Admitting: Family Medicine

## 2020-01-30 ENCOUNTER — Encounter: Payer: Self-pay | Admitting: *Deleted

## 2020-02-01 ENCOUNTER — Telehealth: Payer: Self-pay | Admitting: Lactation Services

## 2020-02-01 ENCOUNTER — Telehealth: Payer: Self-pay | Admitting: Obstetrics & Gynecology

## 2020-02-01 ENCOUNTER — Telehealth (INDEPENDENT_AMBULATORY_CARE_PROVIDER_SITE_OTHER): Payer: Medicare Other | Admitting: Lactation Services

## 2020-02-01 DIAGNOSIS — O0990 Supervision of high risk pregnancy, unspecified, unspecified trimester: Secondary | ICD-10-CM

## 2020-02-01 NOTE — Telephone Encounter (Signed)
Called patient in regards to her BP. Patient reports she is concerned about her BP and that she has a history of Preeclampsia. Pt denies HA, Blurred vision or dizziness.   Discussed with patient that when she is in office on Friday that she will have Preeclampsia labs and a blood pressure when getting her 2 hour glucose labs.   Patient is wanting to know if her BP is at induction level. Discussed with her that the current BP is not at induction level since patient has no other symptoms at this time.   Pt informed that if she has headache, Blurred vision or dizziness along with high BP, she is to go to the MAU to be assessed. Pt voiced understanding.   Pt aware she has an appointment on Friday at 9:15 for her 2 hours glucose and she is not to eat or drink after midnight.

## 2020-02-01 NOTE — Telephone Encounter (Signed)
Patient was told to call office. She is requesting a call back.

## 2020-02-01 NOTE — Telephone Encounter (Signed)
Patient was assessed by nursing staff during this encounter. I have reviewed the chart and agree with the documentation and plan.  Verita Schneiders, MD 02/01/2020 2:58 PM

## 2020-02-01 NOTE — Telephone Encounter (Signed)
Received [facility] alert for BP of 128/98. Called pt and did not get an answer. LM for patient to call the office or send a My Chart message. Will send My Chart message to patient.

## 2020-02-03 ENCOUNTER — Other Ambulatory Visit: Payer: Medicare Other

## 2020-02-03 ENCOUNTER — Telehealth: Payer: Self-pay | Admitting: Family Medicine

## 2020-02-03 NOTE — Telephone Encounter (Signed)
Patient called in stating that she had her 2hr gtt this morning but she forgot and overslept. Patient does not want to be rescheduled because she does not know how she if going to get to the appointment because she is not a morning person. Patient advised that I can get her scheduled the same day are doctor's appointment on 2/24 @ 9:10 or 9:30. Patient declined the appointments and stated she wants a nurse to call her back to see if she has to have it done. Appointment was canceled per patient request and message sent to clinical pool.

## 2020-02-03 NOTE — Telephone Encounter (Signed)
I called [name] back and we discussed she states she is not a morning person and it would be really hard for her to come in and do a 2 hour gtt. I informed her we can do a fasting cbg  Or do a one hour glucose at her next appointment on 02/08/20 and she can discuss with doctor alternative. When I reviewed her appointment date/ time She also asked to move it to 1 or 2pm. I informed her I can not change her appointment but I will forward to registrar and they will contact her.  Jacques Navy

## 2020-02-08 ENCOUNTER — Encounter: Payer: Medicare Other | Admitting: Obstetrics & Gynecology

## 2020-02-08 ENCOUNTER — Other Ambulatory Visit: Payer: Self-pay | Admitting: *Deleted

## 2020-02-08 DIAGNOSIS — O0990 Supervision of high risk pregnancy, unspecified, unspecified trimester: Secondary | ICD-10-CM

## 2020-02-08 DIAGNOSIS — O09899 Supervision of other high risk pregnancies, unspecified trimester: Secondary | ICD-10-CM

## 2020-02-08 DIAGNOSIS — O09299 Supervision of pregnancy with other poor reproductive or obstetric history, unspecified trimester: Secondary | ICD-10-CM

## 2020-02-09 ENCOUNTER — Other Ambulatory Visit: Payer: Medicare Other

## 2020-02-09 ENCOUNTER — Encounter: Payer: Self-pay | Admitting: Family Medicine

## 2020-02-09 ENCOUNTER — Other Ambulatory Visit: Payer: Self-pay

## 2020-02-09 ENCOUNTER — Ambulatory Visit (INDEPENDENT_AMBULATORY_CARE_PROVIDER_SITE_OTHER): Payer: Medicare Other | Admitting: Family Medicine

## 2020-02-09 VITALS — BP 120/86 | HR 104 | Wt 241.7 lb

## 2020-02-09 DIAGNOSIS — O0990 Supervision of high risk pregnancy, unspecified, unspecified trimester: Secondary | ICD-10-CM

## 2020-02-09 DIAGNOSIS — Z3A31 31 weeks gestation of pregnancy: Secondary | ICD-10-CM

## 2020-02-09 DIAGNOSIS — O09293 Supervision of pregnancy with other poor reproductive or obstetric history, third trimester: Secondary | ICD-10-CM

## 2020-02-09 DIAGNOSIS — D696 Thrombocytopenia, unspecified: Secondary | ICD-10-CM

## 2020-02-09 DIAGNOSIS — O09899 Supervision of other high risk pregnancies, unspecified trimester: Secondary | ICD-10-CM

## 2020-02-09 DIAGNOSIS — O99119 Other diseases of the blood and blood-forming organs and certain disorders involving the immune mechanism complicating pregnancy, unspecified trimester: Secondary | ICD-10-CM

## 2020-02-09 DIAGNOSIS — O99113 Other diseases of the blood and blood-forming organs and certain disorders involving the immune mechanism complicating pregnancy, third trimester: Secondary | ICD-10-CM

## 2020-02-09 DIAGNOSIS — O09299 Supervision of pregnancy with other poor reproductive or obstetric history, unspecified trimester: Secondary | ICD-10-CM

## 2020-02-09 DIAGNOSIS — O133 Gestational [pregnancy-induced] hypertension without significant proteinuria, third trimester: Secondary | ICD-10-CM

## 2020-02-09 NOTE — Progress Notes (Signed)
PRENATAL VISIT NOTE  Subjective:  [name] is a 34 y.o. [id number] at [redacted]w[redacted]d being seen today for ongoing prenatal care.  She is currently monitored for the following issues for this high-risk pregnancy and has Borderline behavior; GERD (gastroesophageal reflux disease); Bipolar affective disorder, depressed, moderate degree ([place]); Obesity (BMI 30-39.9); OSA (obstructive sleep apnea); Vaginal venereal warts; Anxiety state; Asthma; Consecutive esotropia; Intellectual disability; ADHD (attention deficit hyperactivity disorder), inattentive type; Hx of preeclampsia, prior pregnancy, currently pregnant; History of preterm delivery after IOL for preeclampsia, currently pregnant; History of gestational diabetes in prior pregnancy, currently pregnant; Short interval between pregnancies affecting pregnancy, antepartum; Supervision of high risk pregnancy, antepartum; Nausea and vomiting during pregnancy; and Thrombocytopenia affecting pregnancy ([place]) on their problem list.  Patient reports headache and seeing stars/sparkles.   Patient had been checking her BP at home and they remain elevated. Today they are similiarly elevated. She reports having elevated BP with every BP check at home for 1 week. She started having  HA last week that has been constant. She has tried tylenol but this did not help. She also says her HA is associated with seeing stars and sparkles in her upper vision. She is "worried about preeclampsia"  She refused glucola today  Contractions: Not present. Vag. Bleeding: None.  Movement: Present. Denies leaking of fluid.   The following portions of the patient's history were reviewed and updated as appropriate: allergies, current medications, past family history, past medical history, past social history, past surgical history and problem list.   Objective:   Vitals:   02/09/20 1518  BP: 120/86  Pulse: (!) 104  Weight: 241 lb 11.2 oz (109.6 kg)   Babyscripts: SBP 120-141 DBP  98-114   Fetal Status: Fetal Heart Rate (bpm): 148   Movement: Present     General:  Alert, oriented and cooperative. Patient is in no acute distress.  Skin: Skin is warm and dry. No rash noted.   Cardiovascular: Normal heart rate noted  Respiratory: Normal respiratory effort, no problems with respiration noted  Abdomen: Soft, gravid, appropriate for gestational age.  Pain/Pressure: Present     Pelvic: Cervical exam deferred        Extremities: Normal range of motion.  Edema: Trace  Mental Status: Normal mood and affect. Normal behavior. Normal judgment and thought content.   Assessment and Plan:  Pregnancy: [id number] at [redacted]w[redacted]d  1.  Gestational HTN  Patient meets criteria for gHTN for persistently elevated BP with DBP consistently > 90 since 01/02/20 with non-adherence to medical advice for evaluation.  Given her symptoms HA/scotomata for the past week without improvement with tylenol,  I am worried about preeclampsia. She has developed preeclampsia with other pregnancies at ~32 wks  I recommended that the patient go to the MAU for evaluation given concern for preeclampsia and preeclampsia with severe features (HA/scotomata). I had to reiterate this several times through the conversation.   I counseled and the patient verbally repeated back the risks of preeclampsia which include but are not limited to eclampsia/Seizures, stroke, placental abruption and stillbirth.   The patient voiced understanding about my recommendations  Adlemi refused to go to the hospital and stated she does not have transportation. I informed the patient that we have access to transportation services to help her get to the hospital. She continued to refuse to go to the hospital.    Joint decision making today to have: 1) PIH/PEC labs today in the office 2) Return home and take Tylenol 500mg   3) strictly discussed that if HA is not 100% improved she will then go to the hospital 4) in person follow up in 1 week  given need to coordinate care/delivery plans and possible antenatal testing.    She will need to be induced at 37 wks regardless and she was informed of this. We discussed that this is if her gHTN does not progress to [place].    Thrombocytopenia affecting pregnancy ([facility]) PLT today  Supervision of high risk pregnancy, antepartum - Comprehensive metabolic panel - Protein / creatinine ratio, urine  Short interval between pregnancies affecting pregnancy, antepartum  History of preterm delivery after IOL for preeclampsia, currently pregnant @36wk  for preeclampsia  Hx of preeclampsia, prior pregnancy, currently pregnant - Protein / creatinine ratio, urine  Preterm labor symptoms and general obstetric precautions including but not limited to vaginal bleeding, contractions, leaking of fluid and fetal movement were reviewed in detail with the patient. Please refer to After Visit Summary for other counseling recommendations.   Return in about 1 week (around 02/16/2020) for Routine prenatal care, in person, BP [place].  Future Appointments  Date Time Provider [place]  02/17/2020 10:45 AM Mead NURSE [place] [facility]  02/17/2020 10:45 AM [place] [facility]  02/20/2020  1:35 PM Chancy Milroy, MD [facility]    Caren Macadam, MD

## 2020-02-10 ENCOUNTER — Telehealth: Payer: Self-pay | Admitting: Family Medicine

## 2020-02-10 ENCOUNTER — Other Ambulatory Visit: Payer: Self-pay | Admitting: Obstetrics & Gynecology

## 2020-02-10 ENCOUNTER — Telehealth: Payer: Self-pay | Admitting: Obstetrics and Gynecology

## 2020-02-10 DIAGNOSIS — O219 Vomiting of pregnancy, unspecified: Secondary | ICD-10-CM

## 2020-02-10 LAB — COMPREHENSIVE METABOLIC PANEL
ALT: 7 IU/L (ref 0–32)
AST: 11 IU/L (ref 0–40)
Albumin/Globulin Ratio: 1.2 (ref 1.2–2.2)
Albumin: 3.7 g/dL — ABNORMAL LOW (ref 3.8–4.8)
Alkaline Phosphatase: 138 IU/L — ABNORMAL HIGH (ref 39–117)
BUN/Creatinine Ratio: 10 (ref 9–23)
BUN: 7 mg/dL (ref 6–20)
Bilirubin Total: 0.5 mg/dL (ref 0.0–1.2)
CO2: 20 mmol/L (ref 20–29)
Calcium: 9.1 mg/dL (ref 8.7–10.2)
Chloride: 101 mmol/L (ref 96–106)
Creatinine, Ser: 0.7 mg/dL (ref 0.57–1.00)
GFR calc Af Amer: 133 mL/min/{1.73_m2} (ref >59)
GFR calc non Af Amer: 115 mL/min/{1.73_m2} (ref >59)
Globulin, Total: 3.1 g/dL (ref 1.5–4.5)
Glucose: 79 mg/dL (ref 65–99)
Potassium: 4.3 mmol/L (ref 3.5–5.2)
Sodium: 137 mmol/L (ref 134–144)
Total Protein: 6.8 g/dL (ref 6.0–8.5)

## 2020-02-10 LAB — CBC
Hematocrit: 39.1 % (ref 34.0–46.6)
Hemoglobin: 13.4 g/dL (ref 11.1–15.9)
MCH: 30.5 pg (ref 26.6–33.0)
MCHC: 34.3 g/dL (ref 31.5–35.7)
MCV: 89 fL (ref 79–97)
Platelets: 180 10*3/uL (ref 150–450)
RBC: 4.39 x10E6/uL (ref 3.77–5.28)
RDW: 12 % (ref 11.7–15.4)
WBC: 8.8 10*3/uL (ref 3.4–10.8)

## 2020-02-10 LAB — PROTEIN / CREATININE RATIO, URINE
Creatinine, Urine: 233.2 mg/dL
Protein, Ur: 36.9 mg/dL
Protein/Creat Ratio: 158 mg/g creat (ref 0–200)

## 2020-02-10 LAB — RPR: RPR Ser Ql: NONREACTIVE

## 2020-02-10 LAB — HIV ANTIBODY (ROUTINE TESTING W REFLEX): HIV Screen 4th Generation wRfx: NONREACTIVE

## 2020-02-10 NOTE — Telephone Encounter (Signed)
The patient called in regarding the test results. She stated she would like a nurse to go over it with her today.

## 2020-02-10 NOTE — Telephone Encounter (Signed)
I returned pt's call and informed her of test results which do not indicate that she has pre-eclampsia @ this time. There is still one test which has not resulted yet and this is why I had not called her yet today. Pt had additional questions regarding whether or not the baby will require a "shot" for the lungs prior to delivery @ 37 wks. I advised that the shot she is referring to is only administered if delivery prior to 37 wks is anticipated and if there is adequate time for administration before the delivery. Her baby will not need it if delivery takes place @ 37 wks. Pt was reminded to check her BP once weekly unless she is having sx of Pre-e. She voiced understanding of all information and instructions given.

## 2020-02-10 NOTE — Telephone Encounter (Signed)
Pt was called and questions answered - notes documented in a different encounter.

## 2020-02-10 NOTE — Telephone Encounter (Signed)
Patient is requesting a callback for her results. 

## 2020-02-13 ENCOUNTER — Telehealth: Payer: Self-pay | Admitting: *Deleted

## 2020-02-13 ENCOUNTER — Encounter: Payer: Self-pay | Admitting: *Deleted

## 2020-02-13 NOTE — Telephone Encounter (Signed)
[name] called front desk and asked to speak to a nurse. She asked for her urine result. I explained her urine protein / creatine ratio looked wnl but I will review with provider and call her back. She then asked for her blood results from recent draw. I explained cbc, cmet  Were wnl.  She then asked if there was a note she was to be induced at 37 weeks. I informed her there is a note she is to be induced at 37 weeks and we usually schedule at 35 weeks- about 2 weeks before date. She states Dr. Ernestina Patches told her we would schedule at her next ob visit on 3/8 . I explained that if the provider wants [place] to schedule we can call L&D and see if they will allow [place] to schedule that early. She also asked if she could pick the time and I said No, that my understanding is we schedule the date and then the L&D nurses call when they have a room available for her. She states she has transportation issues and needs a time. I explained when we schedule to tell the nurse so that we can inform L &D and see if they can make an exception. She voices understanding.  Jacques Navy

## 2020-02-13 NOTE — Telephone Encounter (Signed)
I reviewed protein / creatinine ratio with Dr. Ilda Basset - also cbc, cmet. He advised wnl. I called [facility] and left a message lab was fine, call us if questions and that I will send a MyChart message.  Joneisha Miles,RN

## 2020-02-16 ENCOUNTER — Telehealth (INDEPENDENT_AMBULATORY_CARE_PROVIDER_SITE_OTHER): Payer: Medicare Other | Admitting: Obstetrics and Gynecology

## 2020-02-16 DIAGNOSIS — O0990 Supervision of high risk pregnancy, unspecified, unspecified trimester: Secondary | ICD-10-CM

## 2020-02-16 NOTE — Telephone Encounter (Signed)
The patient called in stating she would like a call back from a nurse. The patient stated she did not want to state what the call is related to as it is personal.

## 2020-02-17 ENCOUNTER — Ambulatory Visit (HOSPITAL_COMMUNITY): Payer: Medicare Other

## 2020-02-17 NOTE — Telephone Encounter (Addendum)
Returned patients call from yesterday. Patient is asking if she is allowed to have sex since she is on bed rest. She reports she is having a lot of pressure in her pelvic area. She feels infant is really low. She reports when she is walking she has to limp. She feels like she is going to fall. She feels like she may have Sciatica as it is pain down one leg. She reports the pain is worse with movement. She reports she was supposed to have an [place] and had to reschedule due to pain.   Discussed if uncomfortable with sex, not to have it. She denies leaking or contractions. She is aware she can go to MAU as needed for evaluation. Patient voiced understanding.

## 2020-02-20 ENCOUNTER — Ambulatory Visit (HOSPITAL_COMMUNITY)
Admission: RE | Admit: 2020-02-20 | Discharge: 2020-02-20 | Disposition: A | Discharge status: Home / Self Care | Payer: Medicare Other | Source: Ambulatory Visit | Attending: Obstetrics | Admitting: Obstetrics

## 2020-02-20 ENCOUNTER — Ambulatory Visit (HOSPITAL_COMMUNITY): Payer: Medicare Other | Admitting: *Deleted

## 2020-02-20 ENCOUNTER — Other Ambulatory Visit: Payer: Self-pay

## 2020-02-20 ENCOUNTER — Telehealth: Payer: Self-pay

## 2020-02-20 ENCOUNTER — Ambulatory Visit (INDEPENDENT_AMBULATORY_CARE_PROVIDER_SITE_OTHER): Payer: Medicare Other | Admitting: Obstetrics and Gynecology

## 2020-02-20 ENCOUNTER — Encounter (HOSPITAL_COMMUNITY): Payer: Self-pay | Admitting: *Deleted

## 2020-02-20 VITALS — BP 128/88 | HR 101 | Wt 242.3 lb

## 2020-02-20 DIAGNOSIS — Z3A34 34 weeks gestation of pregnancy: Secondary | ICD-10-CM

## 2020-02-20 DIAGNOSIS — Z3A33 33 weeks gestation of pregnancy: Secondary | ICD-10-CM | POA: Diagnosis not present

## 2020-02-20 DIAGNOSIS — O99210 Obesity complicating pregnancy, unspecified trimester: Secondary | ICD-10-CM | POA: Insufficient documentation

## 2020-02-20 DIAGNOSIS — O133 Gestational [pregnancy-induced] hypertension without significant proteinuria, third trimester: Secondary | ICD-10-CM

## 2020-02-20 DIAGNOSIS — O0993 Supervision of high risk pregnancy, unspecified, third trimester: Secondary | ICD-10-CM | POA: Diagnosis not present

## 2020-02-20 DIAGNOSIS — O99213 Obesity complicating pregnancy, third trimester: Secondary | ICD-10-CM | POA: Diagnosis not present

## 2020-02-20 DIAGNOSIS — O0990 Supervision of high risk pregnancy, unspecified, unspecified trimester: Secondary | ICD-10-CM

## 2020-02-20 DIAGNOSIS — O09219 Supervision of pregnancy with history of pre-term labor, unspecified trimester: Secondary | ICD-10-CM | POA: Diagnosis not present

## 2020-02-20 DIAGNOSIS — Z8632 Personal history of gestational diabetes: Secondary | ICD-10-CM

## 2020-02-20 DIAGNOSIS — Z362 Encounter for other antenatal screening follow-up: Secondary | ICD-10-CM | POA: Diagnosis not present

## 2020-02-20 DIAGNOSIS — O09893 Supervision of other high risk pregnancies, third trimester: Secondary | ICD-10-CM

## 2020-02-20 DIAGNOSIS — O09293 Supervision of pregnancy with other poor reproductive or obstetric history, third trimester: Secondary | ICD-10-CM

## 2020-02-20 DIAGNOSIS — O139 Gestational [pregnancy-induced] hypertension without significant proteinuria, unspecified trimester: Secondary | ICD-10-CM

## 2020-02-20 DIAGNOSIS — O09299 Supervision of pregnancy with other poor reproductive or obstetric history, unspecified trimester: Secondary | ICD-10-CM

## 2020-02-20 NOTE — Addendum Note (Signed)
Addended by: Bethanne Ginger on: 02/20/2020 02:22 PM   Modules accepted: Orders

## 2020-02-20 NOTE — Addendum Note (Signed)
Addended by: Langston Reusing on: 02/20/2020 02:37 PM   Modules accepted: Orders

## 2020-02-20 NOTE — Patient Instructions (Signed)
Third Trimester of Pregnancy The third trimester is from week 28 through week 40 (months 7 through 9). The third trimester is a time when the unborn baby (fetus) is growing rapidly. At the end of the ninth month, the fetus is about 20 inches in length and weighs 6-10 pounds. Body changes during your third trimester Your body will continue to go through many changes during pregnancy. The changes vary from woman to woman. During the third trimester:  Your weight will continue to increase. You can expect to gain 25-35 pounds (11-16 kg) by the end of the pregnancy.  You may begin to get stretch marks on your hips, abdomen, and breasts.  You may urinate more often because the fetus is moving lower into your pelvis and pressing on your bladder.  You may develop or continue to have heartburn. This is caused by increased hormones that slow down muscles in the digestive tract.  You may develop or continue to have constipation because increased hormones slow digestion and cause the muscles that push waste through your intestines to relax.  You may develop hemorrhoids. These are swollen veins (varicose veins) in the rectum that can itch or be painful.  You may develop swollen, bulging veins (varicose veins) in your legs.  You may have increased body aches in the pelvis, back, or thighs. This is due to weight gain and increased hormones that are relaxing your joints.  You may have changes in your hair. These can include thickening of your hair, rapid growth, and changes in texture. Some women also have hair loss during or after pregnancy, or hair that feels dry or thin. Your hair will most likely return to normal after your baby is born.  Your breasts will continue to grow and they will continue to become tender. A yellow fluid (colostrum) may leak from your breasts. This is the first milk you are producing for your baby.  Your belly button may stick out.  You may notice more swelling in your hands,  face, or ankles.  You may have increased tingling or numbness in your hands, arms, and legs. The skin on your belly may also feel numb.  You may feel short of breath because of your expanding uterus.  You may have more problems sleeping. This can be caused by the size of your belly, increased need to urinate, and an increase in your body's metabolism.  You may notice the fetus "dropping," or moving lower in your abdomen (lightening).  You may have increased vaginal discharge.  You may notice your joints feel loose and you may have pain around your pelvic bone. What to expect at prenatal visits You will have prenatal exams every 2 weeks until week 36. Then you will have weekly prenatal exams. During a routine prenatal visit:  You will be weighed to make sure you and the baby are growing normally.  Your blood pressure will be taken.  Your abdomen will be measured to track your baby's growth.  The fetal heartbeat will be listened to.  Any test results from the previous visit will be discussed.  You may have a cervical check near your due date to see if your cervix has softened or thinned (effaced).  You will be tested for Group B streptococcus. This happens between 35 and 37 weeks. Your health care provider may ask you:  What your birth plan is.  How you are feeling.  If you are feeling the baby move.  If you have had any abnormal   symptoms, such as leaking fluid, bleeding, severe headaches, or abdominal cramping.  If you are using any tobacco products, including cigarettes, chewing tobacco, and electronic cigarettes.  If you have any questions. Other tests or screenings that may be performed during your third trimester include:  Blood tests that check for low iron levels (anemia).  Fetal testing to check the health, activity level, and growth of the fetus. Testing is done if you have certain medical conditions or if there are problems during the pregnancy.  Nonstress test  (NST). This test checks the health of your baby to make sure there are no signs of problems, such as the baby not getting enough oxygen. During this test, a belt is placed around your belly. The baby is made to move, and its heart rate is monitored during movement. What is false labor? False labor is a condition in which you feel small, irregular tightenings of the muscles in the womb (contractions) that usually go away with rest, changing position, or drinking water. These are called Braxton Hicks contractions. Contractions may last for hours, days, or even weeks before true labor sets in. If contractions come at regular intervals, become more frequent, increase in intensity, or become painful, you should see your health care provider. What are the signs of labor?  Abdominal cramps.  Regular contractions that start at 10 minutes apart and become stronger and more frequent with time.  Contractions that start on the top of the uterus and spread down to the lower abdomen and back.  Increased pelvic pressure and dull back pain.  A watery or bloody mucus discharge that comes from the vagina.  Leaking of amniotic fluid. This is also known as your "water breaking." It could be a slow trickle or a gush. Let your health care provider know if it has a color or strange odor. If you have any of these signs, call your health care provider right away, even if it is before your due date. Follow these instructions at home: Medicines  Follow your health care provider's instructions regarding medicine use. Specific medicines may be either safe or unsafe to take during pregnancy.  Take a prenatal vitamin that contains at least 600 micrograms (mcg) of folic acid.  If you develop constipation, try taking a stool softener if your health care provider approves. Eating and drinking   Eat a balanced diet that includes fresh fruits and vegetables, whole grains, good sources of protein such as meat, eggs, or tofu,  and low-fat dairy. Your health care provider will help you determine the amount of weight gain that is right for you.  Avoid raw meat and uncooked cheese. These carry germs that can cause birth defects in the baby.  If you have low calcium intake from food, talk to your health care provider about whether you should take a daily calcium supplement.  Eat four or five small meals rather than three large meals a day.  Limit foods that are high in fat and processed sugars, such as fried and sweet foods.  To prevent constipation: ? Drink enough fluid to keep your urine clear or pale yellow. ? Eat foods that are high in fiber, such as fresh fruits and vegetables, whole grains, and beans. Activity  Exercise only as directed by your health care provider. Most women can continue their usual exercise routine during pregnancy. Try to exercise for 30 minutes at least 5 days a week. Stop exercising if you experience uterine contractions.  Avoid heavy lifting.  Do   not exercise in extreme heat or humidity, or at high altitudes.  Wear low-heel, comfortable shoes.  Practice good posture.  You may continue to have sex unless your health care provider tells you otherwise. Relieving pain and discomfort  Take frequent breaks and rest with your legs elevated if you have leg cramps or low back pain.  Take warm sitz baths to soothe any pain or discomfort caused by hemorrhoids. Use hemorrhoid cream if your health care provider approves.  Wear a good support bra to prevent discomfort from breast tenderness.  If you develop varicose veins: ? Wear support pantyhose or compression stockings as told by your healthcare provider. ? Elevate your feet for 15 minutes, 3-4 times a day. Prenatal care  Write down your questions. Take them to your prenatal visits.  Keep all your prenatal visits as told by your health care provider. This is important. Safety  Wear your seat belt at all times when driving.  Make  a list of emergency phone numbers, including numbers for family, friends, the hospital, and police and fire departments. General instructions  Avoid cat litter boxes and soil used by cats. These carry germs that can cause birth defects in the baby. If you have a cat, ask someone to clean the litter box for you.  Do not travel far distances unless it is absolutely necessary and only with the approval of your health care provider.  Do not use hot tubs, steam rooms, or saunas.  Do not drink alcohol.  Do not use any products that contain nicotine or tobacco, such as cigarettes and e-cigarettes. If you need help quitting, ask your health care provider.  Do not use any medicinal herbs or unprescribed drugs. These chemicals affect the formation and growth of the baby.  Do not douche or use tampons or scented sanitary pads.  Do not cross your legs for long periods of time.  To prepare for the arrival of your baby: ? Take prenatal classes to understand, practice, and ask questions about labor and delivery. ? Make a trial run to the hospital. ? Visit the hospital and tour the maternity area. ? Arrange for maternity or paternity leave through employers. ? Arrange for family and friends to take care of pets while you are in the hospital. ? Purchase a rear-facing car seat and make sure you know how to install it in your car. ? Pack your hospital bag. ? Prepare the baby's nursery. Make sure to remove all pillows and stuffed animals from the baby's crib to prevent suffocation.  Visit your dentist if you have not gone during your pregnancy. Use a soft toothbrush to brush your teeth and be gentle when you floss. Contact a health care provider if:  You are unsure if you are in labor or if your water has broken.  You become dizzy.  You have mild pelvic cramps, pelvic pressure, or nagging pain in your abdominal area.  You have lower back pain.  You have persistent nausea, vomiting, or  diarrhea.  You have an unusual or bad smelling vaginal discharge.  You have pain when you urinate. Get help right away if:  Your water breaks before 37 weeks.  You have regular contractions less than 5 minutes apart before 37 weeks.  You have a fever.  You are leaking fluid from your vagina.  You have spotting or bleeding from your vagina.  You have severe abdominal pain or cramping.  You have rapid weight loss or weight gain.  You have   shortness of breath with chest pain.  You notice sudden or extreme swelling of your face, hands, ankles, feet, or legs.  Your baby makes fewer than 10 movements in 2 hours.  You have severe headaches that do not go away when you take medicine.  You have vision changes. Summary  The third trimester is from week 28 through week 40, months 7 through 9. The third trimester is a time when the unborn baby (fetus) is growing rapidly.  During the third trimester, your discomfort may increase as you and your baby continue to gain weight. You may have abdominal, leg, and back pain, sleeping problems, and an increased need to urinate.  During the third trimester your breasts will keep growing and they will continue to become tender. A yellow fluid (colostrum) may leak from your breasts. This is the first milk you are producing for your baby.  False labor is a condition in which you feel small, irregular tightenings of the muscles in the womb (contractions) that eventually go away. These are called Braxton Hicks contractions. Contractions may last for hours, days, or even weeks before true labor sets in.  Signs of labor can include: abdominal cramps; regular contractions that start at 10 minutes apart and become stronger and more frequent with time; watery or bloody mucus discharge that comes from the vagina; increased pelvic pressure and dull back pain; and leaking of amniotic fluid. This information is not intended to replace advice given to you by your  health care provider. Make sure you discuss any questions you have with your health care provider. Document Revised: 03/24/2019 Document Reviewed: 01/06/2017 Elsevier Patient Education  2020 [employer]  

## 2020-02-20 NOTE — Telephone Encounter (Signed)
Called Pt. To advise that Induction has been scheduled for 03/16/20 at [place], Patient verbalized understanding.

## 2020-02-20 NOTE — Progress Notes (Signed)
Subjective:  [name] is a 34 y.o. [id number] at [redacted]w[redacted]d being seen today for ongoing prenatal care.  She is currently monitored for the following issues for this high-risk pregnancy and has Borderline behavior; GERD (gastroesophageal reflux disease); Bipolar affective disorder, depressed, moderate degree ([place]); Obesity (BMI 30-39.9); OSA (obstructive sleep apnea); Vaginal venereal warts; Anxiety state; Asthma; Consecutive esotropia; Intellectual disability; ADHD (attention deficit hyperactivity disorder), inattentive type; Hx of preeclampsia, prior pregnancy, currently pregnant; History of preterm delivery after IOL for preeclampsia, currently pregnant; History of gestational diabetes in prior pregnancy, currently pregnant; Short interval between pregnancies affecting pregnancy, antepartum; Supervision of high risk pregnancy, antepartum; Nausea and vomiting during pregnancy; and Gestational hypertension, third trimester on their problem list.  Patient reports general discomforts of pregnancy.  Contractions: Irritability. Vag. Bleeding: None.  Movement: Present. Denies leaking of fluid.   The following portions of the patient's history were reviewed and updated as appropriate: allergies, current medications, past family history, past medical history, past social history, past surgical history and problem list. Problem list updated.  Objective:   Vitals:   02/20/20 1344  BP: 128/88  Pulse: (!) 101  Weight: 242 lb 4.8 oz (109.9 kg)    Fetal Status: Fetal Heart Rate (bpm): 148   Movement: Present     General:  Alert, oriented and cooperative. Patient is in no acute distress.  Skin: Skin is warm and dry. No rash noted.   Cardiovascular: Normal heart rate noted  Respiratory: Normal respiratory effort, no problems with respiration noted  Abdomen: Soft, gravid, appropriate for gestational age. Pain/Pressure: Present     Pelvic:  Cervical exam deferred        Extremities: Normal range of motion.   Edema: Trace  Mental Status: Normal mood and affect. Normal behavior. Normal judgment and thought content.   Urinalysis:      Assessment and Plan:  Pregnancy: [id number] at [redacted]w[redacted]d  1. Supervision of high risk pregnancy, antepartum Stable 1 hr gluocla today Growth scan today  2. Gestational hypertension, third trimester BP stable, no evidence of PEC. Discussed with Dr Annamaria Boots, will call GHTN and IOL at 37 weeks Will start  Weekly NST's per his recommendation  3. History of gestational diabetes in prior pregnancy, currently pregnant Glucola today A1c normal in the past  Preterm labor symptoms and general obstetric precautions including but not limited to vaginal bleeding, contractions, leaking of fluid and fetal movement were reviewed in detail with the patient. Please refer to After Visit Summary for other counseling recommendations.  Return in about 2 weeks (around 03/05/2020) for OB visit, face to face, MD provider.   Chancy Milroy, MD

## 2020-02-21 ENCOUNTER — Telehealth: Payer: Self-pay | Admitting: Family Medicine

## 2020-02-21 LAB — GLUCOSE, 1 HOUR GESTATIONAL: Gestational Diabetes Screen: 107 mg/dL (ref 65–139)

## 2020-02-21 NOTE — Telephone Encounter (Signed)
The patient called in requesting a call back. Stated she is calling in regarding her test results.

## 2020-02-21 NOTE — Telephone Encounter (Signed)
Pt's concerns addressed via MyChart message.

## 2020-02-22 ENCOUNTER — Inpatient Hospital Stay (HOSPITAL_COMMUNITY): Admission: RE | Admit: 2020-02-22 | Payer: Medicare Other | Source: Ambulatory Visit

## 2020-02-22 ENCOUNTER — Telehealth (INDEPENDENT_AMBULATORY_CARE_PROVIDER_SITE_OTHER): Payer: Medicare Other | Admitting: Family Medicine

## 2020-02-22 ENCOUNTER — Encounter (HOSPITAL_COMMUNITY): Payer: Self-pay | Admitting: Obstetrics and Gynecology

## 2020-02-22 ENCOUNTER — Inpatient Hospital Stay (HOSPITAL_COMMUNITY)
Admission: AD | Admit: 2020-02-22 | Discharge: 2020-02-22 | Disposition: A | Discharge status: Home / Self Care | Payer: Medicare Other | Attending: Obstetrics and Gynecology | Admitting: Obstetrics and Gynecology

## 2020-02-22 DIAGNOSIS — N898 Other specified noninflammatory disorders of vagina: Secondary | ICD-10-CM | POA: Diagnosis not present

## 2020-02-22 DIAGNOSIS — Z7982 Long term (current) use of aspirin: Secondary | ICD-10-CM | POA: Insufficient documentation

## 2020-02-22 DIAGNOSIS — Z3A33 33 weeks gestation of pregnancy: Secondary | ICD-10-CM

## 2020-02-22 DIAGNOSIS — R42 Dizziness and giddiness: Secondary | ICD-10-CM | POA: Diagnosis not present

## 2020-02-22 DIAGNOSIS — Z3689 Encounter for other specified antenatal screening: Secondary | ICD-10-CM

## 2020-02-22 DIAGNOSIS — R519 Headache, unspecified: Secondary | ICD-10-CM | POA: Diagnosis not present

## 2020-02-22 DIAGNOSIS — Z743 Need for continuous supervision: Secondary | ICD-10-CM | POA: Diagnosis not present

## 2020-02-22 DIAGNOSIS — H538 Other visual disturbances: Secondary | ICD-10-CM | POA: Diagnosis not present

## 2020-02-22 DIAGNOSIS — R109 Unspecified abdominal pain: Secondary | ICD-10-CM | POA: Diagnosis present

## 2020-02-22 DIAGNOSIS — O0990 Supervision of high risk pregnancy, unspecified, unspecified trimester: Secondary | ICD-10-CM

## 2020-02-22 DIAGNOSIS — O26893 Other specified pregnancy related conditions, third trimester: Secondary | ICD-10-CM | POA: Insufficient documentation

## 2020-02-22 DIAGNOSIS — R001 Bradycardia, unspecified: Secondary | ICD-10-CM | POA: Diagnosis not present

## 2020-02-22 LAB — COMPREHENSIVE METABOLIC PANEL
ALT: 9 U/L (ref 0–44)
AST: 12 U/L — ABNORMAL LOW (ref 15–41)
Albumin: 2.6 g/dL — ABNORMAL LOW (ref 3.5–5.0)
Alkaline Phosphatase: 125 U/L (ref 38–126)
Anion gap: 8 (ref 5–15)
BUN: 7 mg/dL (ref 6–20)
CO2: 26 mmol/L (ref 22–32)
Calcium: 8.7 mg/dL — ABNORMAL LOW (ref 8.9–10.3)
Chloride: 106 mmol/L (ref 98–111)
Creatinine, Ser: 0.73 mg/dL (ref 0.44–1.00)
GFR calc Af Amer: >60 mL/min (ref >60)
GFR calc non Af Amer: >60 mL/min (ref >60)
Glucose, Bld: 97 mg/dL (ref 70–99)
Potassium: 4.8 mmol/L (ref 3.5–5.1)
Sodium: 140 mmol/L (ref 135–145)
Total Bilirubin: 0.5 mg/dL (ref 0.3–1.2)
Total Protein: 5.8 g/dL — ABNORMAL LOW (ref 6.5–8.1)

## 2020-02-22 LAB — URINALYSIS, ROUTINE W REFLEX MICROSCOPIC
Bilirubin Urine: NEGATIVE
Glucose, UA: NEGATIVE mg/dL
Hgb urine dipstick: NEGATIVE
Ketones, ur: NEGATIVE mg/dL
Leukocytes,Ua: NEGATIVE
Nitrite: NEGATIVE
Protein, ur: 30 mg/dL — AB
Specific Gravity, Urine: 1.023 (ref 1.005–1.030)
pH: 6 (ref 5.0–8.0)

## 2020-02-22 LAB — CBC
HCT: 37.1 % (ref 36.0–46.0)
Hemoglobin: 12.3 g/dL (ref 12.0–15.0)
MCH: 30.8 pg (ref 26.0–34.0)
MCHC: 33.2 g/dL (ref 30.0–36.0)
MCV: 92.8 fL (ref 80.0–100.0)
Platelets: 174 10*3/uL (ref 150–400)
RBC: 4 MIL/uL (ref 3.87–5.11)
RDW: 12.3 % (ref 11.5–15.5)
WBC: 7.8 10*3/uL (ref 4.0–10.5)
nRBC: 0 % (ref 0.0–0.2)

## 2020-02-22 LAB — WET PREP, GENITAL
Clue Cells Wet Prep HPF POC: NONE SEEN
Sperm: NONE SEEN
Trich, Wet Prep: NONE SEEN
Yeast Wet Prep HPF POC: NONE SEEN

## 2020-02-22 LAB — PROTEIN / CREATININE RATIO, URINE
Creatinine, Urine: 194.37 mg/dL
Protein Creatinine Ratio: 0.12 mg/mg{Cre} (ref 0.00–0.15)
Total Protein, Urine: 24 mg/dL

## 2020-02-22 LAB — FETAL FIBRONECTIN: Fetal Fibronectin: NEGATIVE

## 2020-02-22 MED ORDER — BUTALBITAL-APAP-CAFFEINE 50-325-40 MG PO TABS
2.0000 | ORAL_TABLET | Freq: Once | ORAL | Status: AC
  Administered 2020-02-22: 2 via ORAL
  Filled 2020-02-22: qty 2

## 2020-02-22 NOTE — Telephone Encounter (Signed)
Received a call from the patient stating she wanted to speak with a nurse. When I asked her reasons, she stated she needed to speak to them about something she has been experiencing.

## 2020-02-22 NOTE — Discharge Instructions (Signed)
Labor Induction  Labor induction is when steps are taken to cause a pregnant woman to begin the labor process. Most women go into labor on their own between 37 weeks and 42 weeks of pregnancy. When this does not happen or when there is a medical need for labor to begin, steps may be taken to induce labor. Labor induction causes a pregnant woman's uterus to contract. It also causes the cervix to soften (ripen), open (dilate), and thin out (efface). Usually, labor is not induced before 39 weeks of pregnancy unless there is a medical reason to do so. Your health care provider will determine if labor induction is needed. Before inducing labor, your health care provider will consider a number of factors, including:  Your medical condition and your baby's.  How many weeks along you are in your pregnancy.  How mature your baby's lungs are.  The condition of your cervix.  The position of your baby.  The size of your birth canal. What are some reasons for labor induction? Labor may be induced if:  Your health or your baby's health is at risk.  Your pregnancy is overdue by 1 week or more.  Your water breaks but labor does not start on its own.  There is a low amount of amniotic fluid around your baby. You may also choose (elect) to have labor induced at a certain time. Generally, elective labor induction is done no earlier than 39 weeks of pregnancy. What methods are used for labor induction? Methods used for labor induction include:  Prostaglandin medicine. This medicine starts contractions and causes the cervix to dilate and ripen. It can be taken by mouth (orally) or by being inserted into the vagina (suppository).  Inserting a small, thin tube (catheter) with a balloon into the vagina and then expanding the balloon with water to dilate the cervix.  Stripping the membranes. In this method, your health care provider gently separates amniotic sac tissue from the cervix. This causes the  cervix to stretch, which in turn causes the release of a hormone called progesterone. The hormone causes the uterus to contract. This procedure is often done during an office visit, after which you will be sent home to wait for contractions to begin.  Breaking the water. In this method, your health care provider uses a small instrument to make a small hole in the amniotic sac. This eventually causes the amniotic sac to break. Contractions should begin after a few hours.  Medicine to trigger or strengthen contractions. This medicine is given through an IV that is inserted into a vein in your arm. Except for membrane stripping, which can be done in a clinic, labor induction is done in the hospital so that you and your baby can be carefully monitored. How long does it take for labor to be induced? The length of time it takes to induce labor depends on how ready your body is for labor. Some inductions can take up to 2-3 days, while others may take less than a day. Induction may take longer if:  You are induced early in your pregnancy.  It is your first pregnancy.  Your cervix is not ready. What are some risks associated with labor induction? Some risks associated with labor induction include:  Changes in fetal heart rate, such as being too high, too low, or irregular (erratic).  Failed induction.  Infection in the mother or the baby.  Increased risk of having a cesarean delivery.  Fetal death.  Breaking off (abruption)   of the placenta from the uterus (rare).  Rupture of the uterus (very rare). When induction is needed for medical reasons, the benefits of induction generally outweigh the risks. What are some reasons for not inducing labor? Labor induction should not be done if:  Your baby does not tolerate contractions.  You have had previous surgeries on your uterus, such as a myomectomy, removal of fibroids, or a vertical scar from a previous cesarean delivery.  Your placenta lies  very low in your uterus and blocks the opening of the cervix (placenta previa).  Your baby is not in a head-down position.  The umbilical cord drops down into the birth canal in front of the baby.  There are unusual circumstances, such as the baby being very early (premature).  You have had more than 2 previous cesarean deliveries. Summary  Labor induction is when steps are taken to cause a pregnant woman to begin the labor process.  Labor induction causes a pregnant woman's uterus to contract. It also causes the cervix to ripen, dilate, and efface.  Labor is not induced before 39 weeks of pregnancy unless there is a medical reason to do so.  When induction is needed for medical reasons, the benefits of induction generally outweigh the risks. This information is not intended to replace advice given to you by your health care provider. Make sure you discuss any questions you have with your health care provider. Document Revised: 12/04/2017 Document Reviewed: 01/14/2017 Elsevier Patient Education  2020 [employer]  

## 2020-02-22 NOTE — MAU Provider Note (Signed)
History     CSN: [id number]  Arrival date and time: 02/22/20 1610   First Provider Initiated Contact with Patient 02/22/20 1636      Chief Complaint  Patient presents with  . Abdominal Pain  . Headache   [name] is a 34 y.o. [id number] at 6w5dwho [facility]  She presents today for Abdominal Pain and Headache.  Patient arrived by EMS and reports that she is having "a severe headache, seeing spots, blurred vision, and contractions."  Patient endorses fetal movement and denies vaginal bleeding.  She reports white vaginal discharge that started today.  She reports that she fell yesterday while sitting in a chair that broke.  She denies hitting her abdomen.  She reports some soreness "on my butt," and took tylenol without relief of her pain. She states her other symptoms started today, but her headache has been present for "2-3 days."  Patient reports taking tylenol for her headache without relief of her symptoms. Patient reports her headache is currently the most intense in her temples.  Patient reports she saw "KLauretta Chestertwo weeks ago and was told that she thought she had PreEclampsia."  Patient rates her headache a 10/10 and reports the contractions are 7-8/10.  However, patient states she has not had an contraction since arrival. She denies sexual activity and states "I haven't had sex in months because it is too uncomfortable." She states that she is also having back pain that she describes as constant and in her lower back.      OB History    Gravida  6   Para  4   Term  3   Preterm  1   AB  1   Living  3     SAB  1   TAB      Ectopic      Multiple  0   Live Births  3           Past Medical History:  Diagnosis Date  . Anxiety   . Asthma    inhaler PRN-hasn't used inhaler in months  . Attention deficit hyperactivity disorder   . Bipolar disorder ([facility]    hospitalized as a teen for suicidal ideations and cutting  . Depression     no complaints, doing well  . Gastritis   . GERD (gastroesophageal reflux disease)   . HPV (human papilloma virus) anogenital infection   . Hx of varicella   . Mild preeclampsia 02/09/2016  . Pregnancy induced hypertension   . Vaginal Pap smear, abnormal     Past Surgical History:  Procedure Laterality Date  . EYE MUSCLE SURGERY Bilateral    07/29/12  . MOUTH SURGERY    . PLANTAR FASCIA SURGERY    . WISDOM TOOTH EXTRACTION      Family History  Problem Relation Age of Onset  . Depression Mother   . Colon cancer Mother   . Colon polyps Mother   . Depression Father   . Lung cancer Father   . Depression Brother   . Liver disease Other   . Kidney disease Other     Social History   Tobacco Use  . Smoking status: Never Smoker  . Smokeless tobacco: Never Used  Substance Use Topics  . Alcohol use: No    Alcohol/week: 0.0 standard drinks  . Drug use: No    Allergies:  Allergies  Allergen Reactions  . Depakote [Divalproex Sodium] Other (See Comments)  Pt states that this medication makes her blood levels toxic.    [name] Methylphenidate Derivatives Other (See Comments)    Reaction:  Depression and anger   . Neurontin [Gabapentin] Other (See Comments)    Reaction:  Dizziness   . Prozac [Fluoxetine Hcl] Other (See Comments)    Reaction:  Anger   . Methylphenidate Hcl     SAME AS PREVIOUS LISTED METHYLPHENIDATE    Medications Prior to Admission  Medication Sig Dispense Refill Last Dose  . aspirin EC 81 MG tablet Take 1 tablet (81 mg total) by mouth daily. Take after 12 weeks for prevention of preeclampsia later in pregnancy 300 tablet 2   . Blood Pressure Monitoring (BLOOD PRESSURE KIT) DEVI 1 Device by Does not apply route as needed. ICD 10 O09.90 1 Device 0   . Elastic Bandages & Supports (COMFORT FIT MATERNITY SUPP LG) MISC 1 Units by Does not apply route daily as needed. 1 each 0   . famotidine (PEPCID) 20 MG tablet Take 1 tablet (20 mg total) by mouth 2 (two) times  daily. 60 tablet 1   . Prenatal Vit w/Fe-Methylfol-FA (PNV PO) Take by mouth.     . promethazine (PHENERGAN) 25 MG tablet TAKE 1 TABLET (25 MG TOTAL) BY MOUTH EVERY 6 (SIX) HOURS AS NEEDED FOR NAUSEA OR VOMITING. 30 tablet 0   . QUEtiapine (SEROQUEL) 25 MG tablet Take 25 mg by mouth at bedtime.     . Sertraline HCl (ZOLOFT PO) Take 10 mg by mouth at bedtime.       Review of Systems  Constitutional: Negative for chills and fever.  Eyes: Positive for visual disturbance.  Respiratory: Negative for cough and shortness of breath.   Gastrointestinal: Positive for abdominal pain. Negative for nausea and vomiting.  Genitourinary: Positive for pelvic pain and vaginal discharge. Negative for difficulty urinating, dysuria and vaginal bleeding.  Musculoskeletal: Positive for back pain.  Neurological: Positive for dizziness, light-headedness and headaches.   Physical Exam   Blood pressure (!) 97/51, pulse (!) 110, temperature 98.6 F (37 C), resp. rate 18, last menstrual period 06/25/2019, SpO2 98 %, unknown if currently breastfeeding.   Vitals:   02/22/20 1731 02/22/20 1746 02/22/20 1809 02/22/20 1828  BP: 105/69 113/66 (!) 94/59 104/71  Pulse: 95 84 77   Resp:      Temp:      SpO2:         Physical Exam  Constitutional: She is oriented to person, place, and time. She appears well-developed and well-nourished.  Obese  HENT:  Head: Normocephalic and atraumatic.  Eyes: Conjunctivae are normal.  Cardiovascular: Normal rate.  Respiratory: Effort normal.  GI: Soft.  Genitourinary: Cervix exhibits no motion tenderness and no discharge.    Vaginal discharge (Small amt thin white discharge. ) present.     No vaginal bleeding.  No bleeding in the vagina.    Genitourinary Comments: Speculum Exam: -Normal External Genitalia: Non tender, no apparent discharge at introitus.  -Vaginal Vault: Pink mucosa. Small amt thin white discharge.  fFN collected from posterior fornix.  Wet prep  collected -Cervix:Pink, no lesions, cysts, or polyps.  Appears closed. No active bleeding from os-GC/CT collected -Bimanual Exam: 1cm/th/ballotable    Musculoskeletal:        General: Normal range of motion.     Cervical back: Normal range of motion.  Neurological: She is alert and oriented to person, place, and time.  Skin: Skin is warm and dry.  Psychiatric: She has a normal  mood and affect. Her behavior is normal.    Fetal Assessment 150 bpm, Mod Var, -Decels, +Accels Toco: No ctx graphed, mild irritability noted.   MAU Course   Results for orders placed or performed during the hospital encounter of 02/22/20 (from the past 24 hour(s))  Urinalysis, Routine w reflex microscopic     Status: Abnormal   Collection Time: 02/22/20  4:26 PM  Result Value Ref Range   Color, Urine YELLOW YELLOW   APPearance CLOUDY (A) CLEAR   Specific Gravity, Urine 1.023 1.005 - 1.030   pH 6.0 5.0 - 8.0   Glucose, UA NEGATIVE NEGATIVE mg/dL   Hgb urine dipstick NEGATIVE NEGATIVE   Bilirubin Urine NEGATIVE NEGATIVE   Ketones, ur NEGATIVE NEGATIVE mg/dL   Protein, ur 30 (A) NEGATIVE mg/dL   Nitrite NEGATIVE NEGATIVE   Leukocytes,Ua NEGATIVE NEGATIVE   RBC / HPF 0-5 0 - 5 RBC/hpf   WBC, UA 0-5 0 - 5 WBC/hpf   Bacteria, UA RARE (A) NONE SEEN   Squamous Epithelial / LPF 11-20 0 - 5   Mucus PRESENT    Amorphous Crystal PRESENT   Protein / creatinine ratio, urine     Status: None   Collection Time: 02/22/20  4:33 PM  Result Value Ref Range   Creatinine, Urine 194.37 mg/dL   Total Protein, Urine 24 mg/dL   Protein Creatinine Ratio 0.12 0.00 - 0.15 mg/mg[Cre]  Wet prep, genital     Status: Abnormal   Collection Time: 02/22/20  4:47 PM  Result Value Ref Range   Yeast Wet Prep HPF POC NONE SEEN NONE SEEN   Trich, Wet Prep NONE SEEN NONE SEEN   Clue Cells Wet Prep HPF POC NONE SEEN NONE SEEN   WBC, Wet Prep HPF POC MANY (A) NONE SEEN   Sperm NONE SEEN   Fetal fibronectin     Status: None    Collection Time: 02/22/20  4:48 PM  Result Value Ref Range   Fetal Fibronectin NEGATIVE NEGATIVE  CBC     Status: None   Collection Time: 02/22/20  5:03 PM  Result Value Ref Range   WBC 7.8 4.0 - 10.5 K/uL   RBC 4.00 3.87 - 5.11 MIL/uL   Hemoglobin 12.3 12.0 - 15.0 g/dL   HCT 37.1 36.0 - 46.0 %   MCV 92.8 80.0 - 100.0 fL   MCH 30.8 26.0 - 34.0 pg   MCHC 33.2 30.0 - 36.0 g/dL   RDW 12.3 11.5 - 15.5 %   Platelets 174 150 - 400 K/uL   nRBC 0.0 0.0 - 0.2 %  Comprehensive metabolic panel     Status: Abnormal   Collection Time: 02/22/20  5:03 PM  Result Value Ref Range   Sodium 140 135 - 145 mmol/L   Potassium 4.8 3.5 - 5.1 mmol/L   Chloride 106 98 - 111 mmol/L   CO2 26 22 - 32 mmol/L   Glucose, Bld 97 70 - 99 mg/dL   BUN 7 6 - 20 mg/dL   Creatinine, Ser 0.73 0.44 - 1.00 mg/dL   Calcium 8.7 (L) 8.9 - 10.3 mg/dL   Total Protein 5.8 (L) 6.5 - 8.1 g/dL   Albumin 2.6 (L) 3.5 - 5.0 g/dL   AST 12 (L) 15 - 41 U/L   ALT 9 0 - 44 U/L   Alkaline Phosphatase 125 38 - 126 U/L   Total Bilirubin 0.5 0.3 - 1.2 mg/dL   GFR calc non Af Amer >60 >60 mL/min  GFR calc Af Amer >60 >60 mL/min   Anion gap 8 5 - 15   No results found.  MDM PE Labs: UA, CBC, CMP, PC Ratio Cultures: fFN, GC/CT, Wet Prep EFM Pain Medication Assessment and Plan  34 year old [id number]  SIUP at 33.5weeks Cat I FT Abdominal Cramping Vaginal Discharge Headache Visual Disturbances  -POC reviewed. -Exam performed and findings discussed. -Cultures collected and pending.  -Labs ordered. -Patient offered and accepts pain medication. -Will give Fioricet 2 tablets now.  -Will continue to monitor and reassess.   Maryann Conners MSN, CNM 02/22/2020, 4:37 PM    Reassessment (6:12 PM) fFN Negative  -Labs results discussed. -Patient reports headache now 4-5/10. -Patient also reports that back pain has improved.  -Patient questions if she still needs to be induced at 37 weeks.  Informed that she still will be  induced and is scheduled for April 2nd at [facility].  -Instructed to keep scheduled appts. --Encouraged to call or return to MAU if symptoms worsen or with the onset of new symptoms. -Discharged to home in stable condition.  Maryann Conners MSN, CNM Advanced Practice Provider, [facility]

## 2020-02-22 NOTE — Telephone Encounter (Addendum)
Returned patients call. She reports she is having stomach pain for the past 30 minutes although she sent a message about an hour ago stating she was having stomach pain. She has had 3-4 in the past 30 minutes per patient. She reports when she is feeling them her stomach is hard. She reports sometimes they are painful. She reports when she wiped earlier, she saw a white discharge. No bleeding noted. Baby is moving well per patient.   Patient reports she had a fall yesterday. She reports the chair collapsed and the chair broke and she fell on her bottom. Patient reports she did not hit her stomach when she fell.   Reviewed with Dr. Ilda Basset and recommendation is to monitor contractions and if increased then go to MAU for evaluation. Reviewed normalcy of Braxton Hicks contractions with patient at this point in her pregnancy but if contractions continue or increase she will need to go to Maternity Assessment Unit for evaluation. Encouraged patient to monitor her pains/belly tightening over the next hour or so and go to MAU if more than 5-6 contractions an hour.

## 2020-02-22 NOTE — MAU Note (Addendum)
Pt presents to MAU via EMS for abdominal pain that started today. She was sitting in a chair that broke last night, has concerns pain is related. She also reports a HA and visual changes. Pt denies VB and LOF.  +FM

## 2020-02-23 LAB — GC/CHLAMYDIA PROBE AMP (~~LOC~~) NOT AT ARMC
Chlamydia: NEGATIVE
Comment: NEGATIVE
Comment: NORMAL
Neisseria Gonorrhea: NEGATIVE

## 2020-02-24 ENCOUNTER — Ambulatory Visit (HOSPITAL_COMMUNITY): Payer: Medicare Other

## 2020-02-28 ENCOUNTER — Telehealth: Payer: Self-pay | Admitting: Family Medicine

## 2020-02-28 NOTE — Telephone Encounter (Signed)
Patient is requesting to speak with a nurse.

## 2020-02-28 NOTE — Telephone Encounter (Signed)
Attempted to reach this patient with the number listed in Epic. Someone answered the phone, but didn't say anything. Then they hung up the phone. Tried again and it went straight to voicemail.

## 2020-02-28 NOTE — Telephone Encounter (Signed)
Addressed patient's concerns via mychart message.

## 2020-02-29 ENCOUNTER — Telehealth: Payer: Self-pay | Admitting: *Deleted

## 2020-02-29 ENCOUNTER — Other Ambulatory Visit: Payer: Medicare Other

## 2020-02-29 NOTE — Telephone Encounter (Signed)
Pt called office and spoke with [name]. She stated that her medicaid transportation did not pick her up for today's visit scheduled @ 3:15. She requested to reschedule the visit however stated that Medicaid requires at least 3 days advance notice in order to schedule a ride. Pt already has scheduled visit on 3/24 @ 2:15pm. I spoke with her and  confirmed that baby is moving well every [name]. I advised to pt that she should keep her appt as scheduled on 3/24. Pt then stated that she had elevated BP yesterday - 140/110. She re-checked it 15 minutes later and got result 138/112. Pt denies having H/A or visual disturbances at the time of elevated BP's. She has not checked it today. Pt was advised to check her BP within the next hour or so and to be sure to sit for 10-15 minutes prior to checking. She should not move or speak while checking the BP. Pt was asked to record her BP in [facility]s. If BP is >140/90, she should rest and then check again 1 hour later. Pt voiced understanding and agreed to instructions given.

## 2020-03-02 ENCOUNTER — Telehealth: Payer: Self-pay | Admitting: Advanced Practice Midwife

## 2020-03-02 ENCOUNTER — Encounter (HOSPITAL_COMMUNITY): Payer: Self-pay | Admitting: Obstetrics & Gynecology

## 2020-03-02 ENCOUNTER — Other Ambulatory Visit: Payer: Self-pay | Admitting: Advanced Practice Midwife

## 2020-03-02 ENCOUNTER — Inpatient Hospital Stay (HOSPITAL_COMMUNITY)
Admission: AD | Admit: 2020-03-02 | Discharge: 2020-03-02 | Disposition: A | Discharge status: Home / Self Care | Payer: Medicare Other | Attending: Obstetrics & Gynecology | Admitting: Obstetrics & Gynecology

## 2020-03-02 DIAGNOSIS — O2343 Unspecified infection of urinary tract in pregnancy, third trimester: Secondary | ICD-10-CM

## 2020-03-02 DIAGNOSIS — R58 Hemorrhage, not elsewhere classified: Secondary | ICD-10-CM | POA: Diagnosis not present

## 2020-03-02 DIAGNOSIS — Z3A35 35 weeks gestation of pregnancy: Secondary | ICD-10-CM | POA: Diagnosis not present

## 2020-03-02 DIAGNOSIS — O26893 Other specified pregnancy related conditions, third trimester: Secondary | ICD-10-CM | POA: Diagnosis not present

## 2020-03-02 DIAGNOSIS — R102 Pelvic and perineal pain: Secondary | ICD-10-CM | POA: Diagnosis present

## 2020-03-02 DIAGNOSIS — O4693 Antepartum hemorrhage, unspecified, third trimester: Secondary | ICD-10-CM | POA: Diagnosis not present

## 2020-03-02 LAB — URINALYSIS, ROUTINE W REFLEX MICROSCOPIC
Bilirubin Urine: NEGATIVE
Glucose, UA: NEGATIVE mg/dL
Ketones, ur: NEGATIVE mg/dL
Nitrite: NEGATIVE
Protein, ur: 100 mg/dL — AB
Specific Gravity, Urine: 1.016 (ref 1.005–1.030)
pH: 6 (ref 5.0–8.0)

## 2020-03-02 LAB — WET PREP, GENITAL
Clue Cells Wet Prep HPF POC: NONE SEEN
Sperm: NONE SEEN
Trich, Wet Prep: NONE SEEN
Yeast Wet Prep HPF POC: NONE SEEN

## 2020-03-02 MED ORDER — CEPHALEXIN 500 MG PO CAPS: 500.0000 mg | ORAL_CAPSULE | Freq: Four times a day (QID) | ORAL | 0 refills | Status: DC

## 2020-03-02 MED ORDER — CEFADROXIL 500 MG PO CAPS: 500.0000 mg | ORAL_CAPSULE | Freq: Two times a day (BID) | ORAL | 0 refills | Status: AC

## 2020-03-02 NOTE — Progress Notes (Signed)
Rx changed to Duricef 500 mg BID x 7 days, and Keflex Rx cancelled.

## 2020-03-02 NOTE — MAU Note (Signed)
Pt presents to MAU with c/o light pink to red spotting that started today, she noticed the spotting when wiping. She has had pelvic pain and pressure that started a few days ago. Denies LOF. +FM

## 2020-03-02 NOTE — Telephone Encounter (Signed)
Called pt to inform her of UTI and Rx for antibiotics.  Rx for Duracef sent to pharmacy. Keflex sent originally but ordered changed for twice daily dosing with Duracef.  Pt will pick up prescription and f/u with prenatal visits as scheduled.  Return to MAU with emergencies.

## 2020-03-02 NOTE — Progress Notes (Signed)
UA c/w UTI in pregnancy.  Urine sent for culture.  Rx for Keflex 500 mg QID x 7 days sent to pt  [facility] on [address] in [place].

## 2020-03-02 NOTE — MAU Provider Note (Signed)
Chief Complaint:  Pelvic Pain and Vaginal Bleeding   First Provider Initiated Contact with Patient 03/02/20 1152      HPI: [name] is a 34 y.o. [id number] at 26w0dwho presents to maternity admissions reporting vaginal bleeding and constant pelvic pressure. She reports she saw light red bleeding when wiping x 2 today but did not see bleeding in the bathroom upon arrival in MAU. She denies any vaginal itching, burning or discharge. She denies recent intercourse.  She has no constipation and was not having a bowel movement when the bleeding started. Her pain is constant lower abdominal and pelvic pressure.  Standing makes it worse. There are no other symptoms. She has not tried any treatments. She reports good fetal movement.  HPI  Past Medical History: Past Medical History:  Diagnosis Date  . Anxiety   . Asthma    inhaler PRN-hasn't used inhaler in months  . Attention deficit hyperactivity disorder   . Bipolar disorder ([facility]    hospitalized as a teen for suicidal ideations and cutting  . Depression    no complaints, doing well  . Gastritis   . GERD (gastroesophageal reflux disease)   . HPV (human papilloma virus) anogenital infection   . Hx of varicella   . Mild preeclampsia 02/09/2016  . Pregnancy induced hypertension   . Vaginal Pap smear, abnormal     Past obstetric history: OB History  Gravida Para Term Preterm AB Living  6 4 3 1 1 3   SAB TAB Ectopic Multiple Live Births  1     0 3    # Outcome Date GA Lbr Len/2nd Weight Sex Delivery Anes PTL Lv  6 Current           5 SAB 05/2019          4 Term 12/17/18 [id number] g  Vag-Spont        Birth Comments: Hypertension  3 Term 02/19/18 3735w2d6:02 / 01:46 3790 g M Vag-Spont EPI  LIV     Birth Comments: hypertension  2 Preterm 02/03/17 35w37w6d00 g M Vag-Spont EPI  LIV     Birth Comments: hypertension  1 Term 02/11/16 38w166w1d:14 3702 g F Vag-Spont EPI  LIV     Birth Comments: preeclampsia    Past Surgical  History: Past Surgical History:  Procedure Laterality Date  . EYE MUSCLE SURGERY Bilateral    07/29/12  . MOUTH SURGERY    . PLANTAR FASCIA SURGERY    . WISDOM TOOTH EXTRACTION      Family History: Family History  Problem Relation Age of Onset  . Depression Mother   . Colon cancer Mother   . Colon polyps Mother   . Depression Father   . Lung cancer Father   . Depression Brother   . Liver disease Other   . Kidney disease Other     Social History: Social History   Tobacco Use  . Smoking status: Never Smoker  . Smokeless tobacco: Never Used  Substance Use Topics  . Alcohol use: No    Alcohol/week: 0.0 standard drinks  . Drug use: No    Allergies:  Allergies  Allergen Reactions  . Depakote [Divalproex Sodium] Other (See Comments)    Pt states that this medication makes her blood levels toxic.    . MetMarland Kitchenylphenidate Derivatives Other (See Comments)    Reaction:  Depression and anger   . Neurontin [Gabapentin] Other (See Comments)    Reaction:  Dizziness   .  Prozac [Fluoxetine Hcl] Other (See Comments)    Reaction:  Anger   . Methylphenidate Hcl     SAME AS PREVIOUS LISTED METHYLPHENIDATE    Meds:  No medications prior to admission.    ROS:  Review of Systems  Constitutional: Negative for chills, fatigue and fever.  Eyes: Negative for visual disturbance.  Respiratory: Negative for shortness of breath.   Cardiovascular: Negative for chest pain.  Gastrointestinal: Negative for abdominal pain, nausea and vomiting.  Genitourinary: Negative for difficulty urinating, dysuria, flank pain, pelvic pain, vaginal bleeding, vaginal discharge and vaginal pain.  Neurological: Negative for dizziness and headaches.  Psychiatric/Behavioral: Negative.      I have reviewed patient's Past Medical Hx, Surgical Hx, Family Hx, Social Hx, medications and allergies.   Physical Exam   Patient Vitals for the past 24 hrs:  BP Temp Pulse Resp SpO2  03/02/20 1215 126/86 -- (!) 103  -- --  03/02/20 1134 116/84 97.9 F (36.6 C) (!) 112 18 --  03/02/20 1133 -- -- -- -- 96 %   Constitutional: Well-developed, well-nourished female in no acute distress.  Cardiovascular: normal rate Respiratory: normal effort GI: Abd soft, non-tender, gravid appropriate for gestational age.  MS: Extremities nontender, no edema, normal ROM Neurologic: Alert and oriented x 4.  GU: Neg CVAT.  PELVIC EXAM: Cervix pink, visually closed, without lesion, scant white creamy discharge, no bleeding seen on exam, vaginal walls and external genitalia normal  Dilation: Fingertip Effacement (%): Thick Cervical Position: Posterior Exam by:: Danelle Berry, CNM  FHT:  Baseline 135 , moderate variability, accelerations present, no decelerations Contractions: None on toco or to palpation   Labs: Results for orders placed or performed during the hospital encounter of 03/02/20 (from the past 24 hour(s))  Wet prep, genital     Status: Abnormal   Collection Time: 03/02/20 12:15 PM  Result Value Ref Range   Yeast Wet Prep HPF POC NONE SEEN NONE SEEN   Trich, Wet Prep NONE SEEN NONE SEEN   Clue Cells Wet Prep HPF POC NONE SEEN NONE SEEN   WBC, Wet Prep HPF POC MANY (A) NONE SEEN   Sperm NONE SEEN   Urinalysis, Routine w reflex microscopic     Status: Abnormal   Collection Time: 03/02/20 12:24 PM  Result Value Ref Range   Color, Urine YELLOW YELLOW   APPearance CLOUDY (A) CLEAR   Specific Gravity, Urine 1.016 1.005 - 1.030   pH 6.0 5.0 - 8.0   Glucose, UA NEGATIVE NEGATIVE mg/dL   Hgb urine dipstick MODERATE (A) NEGATIVE   Bilirubin Urine NEGATIVE NEGATIVE   Ketones, ur NEGATIVE NEGATIVE mg/dL   Protein, ur 100 (A) NEGATIVE mg/dL   Nitrite NEGATIVE NEGATIVE   Leukocytes,Ua MODERATE (A) NEGATIVE   RBC / HPF 0-5 0 - 5 RBC/hpf   WBC, UA 6-10 0 - 5 WBC/hpf   Bacteria, UA MANY (A) NONE SEEN   Squamous Epithelial / LPF 6-10 0 - 5   Mucus PRESENT    O/Positive/-- (11/09 1813)  Imaging:    MAU  Course/MDM: Orders Placed This Encounter  Procedures  . Wet prep, genital  . Urinalysis, Routine w reflex microscopic  . Discharge patient    No orders of the defined types were placed in this encounter.    NST reviewed and reactive No evidence of vaginal bleeding No evidence of preterm labor with cervix 0.5/thick/posterior UA and wet prep pending Pt prefers to go home with labs pending Return to MAU as needed  for preterm labor or emergencies Pt discharge with strict return precautions.   Assessment: 1. Vaginal bleeding in pregnancy, third trimester     Plan: Discharge home with bleeding precautions Labor precautions and fetal kick counts Follow-up [place] for [facility] Follow up.   Specialty: Obstetrics and Gynecology Why: As scheduled, return to MAU as needed for signs of labor or emergencies. Contact information: [address], [place] [id number] [place] [address] [phone]         Allergies as of 03/02/2020      Reactions   Depakote [divalproex Sodium] Other (See Comments)   Pt states that this medication makes her blood levels toxic.     Methylphenidate Derivatives Other (See Comments)   Reaction:  Depression and anger    Neurontin [gabapentin] Other (See Comments)   Reaction:  Dizziness    Prozac [fluoxetine Hcl] Other (See Comments)   Reaction:  Anger    Methylphenidate Hcl    SAME AS PREVIOUS LISTED METHYLPHENIDATE      Medication List    TAKE these medications   aspirin EC 81 MG tablet Take 1 tablet (81 mg total) by mouth daily. Take after 12 weeks for prevention of preeclampsia later in pregnancy   Blood Pressure Kit Devi 1 Device by Does not apply route as needed. ICD 10 O09.90   Comfort Fit Maternity Supp Lg Misc 1 Units by Does not apply route daily as needed.   famotidine 20 MG tablet Commonly known as: Pepcid Take 1 tablet (20 mg total) by mouth 2 (two)  times daily.   PNV PO Take by mouth.   promethazine 25 MG tablet Commonly known as: PHENERGAN TAKE 1 TABLET (25 MG TOTAL) BY MOUTH EVERY 6 (SIX) HOURS AS NEEDED FOR NAUSEA OR VOMITING.   QUEtiapine 25 MG tablet Commonly known as: SEROQUEL Take 25 mg by mouth at bedtime.   ZOLOFT PO Take 10 mg by mouth at bedtime.       Fatima Blank Certified Nurse-Midwife 03/02/2020 2:53 PM

## 2020-03-05 ENCOUNTER — Encounter: Payer: Medicare Other | Admitting: Family Medicine

## 2020-03-05 ENCOUNTER — Telehealth (HOSPITAL_COMMUNITY): Payer: Self-pay | Admitting: *Deleted

## 2020-03-05 ENCOUNTER — Encounter: Payer: Self-pay | Admitting: Student

## 2020-03-05 ENCOUNTER — Encounter: Payer: Self-pay | Admitting: Lactation Services

## 2020-03-05 ENCOUNTER — Encounter (HOSPITAL_COMMUNITY): Payer: Self-pay | Admitting: *Deleted

## 2020-03-05 ENCOUNTER — Other Ambulatory Visit: Payer: Self-pay | Admitting: *Deleted

## 2020-03-05 DIAGNOSIS — O219 Vomiting of pregnancy, unspecified: Secondary | ICD-10-CM

## 2020-03-05 LAB — GC/CHLAMYDIA PROBE AMP (~~LOC~~) NOT AT ARMC
Chlamydia: NEGATIVE
Comment: NEGATIVE
Comment: NORMAL
Neisseria Gonorrhea: NEGATIVE

## 2020-03-05 MED ORDER — PROMETHAZINE HCL 25 MG PO TABS: 25.0000 mg | ORAL_TABLET | Freq: Four times a day (QID) | ORAL | 0 refills | Status: DC | PRN

## 2020-03-05 NOTE — Telephone Encounter (Signed)
Preadmission screen  

## 2020-03-07 ENCOUNTER — Other Ambulatory Visit: Payer: Self-pay | Admitting: Obstetrics and Gynecology

## 2020-03-07 ENCOUNTER — Encounter (HOSPITAL_COMMUNITY): Payer: Self-pay | Admitting: Obstetrics & Gynecology

## 2020-03-07 ENCOUNTER — Other Ambulatory Visit: Payer: Self-pay | Admitting: Advanced Practice Midwife

## 2020-03-07 ENCOUNTER — Telehealth: Payer: Self-pay | Admitting: Lactation Services

## 2020-03-07 ENCOUNTER — Encounter: Payer: Self-pay | Admitting: Lactation Services

## 2020-03-07 ENCOUNTER — Other Ambulatory Visit: Payer: Self-pay

## 2020-03-07 ENCOUNTER — Other Ambulatory Visit: Payer: Self-pay | Admitting: Family Medicine

## 2020-03-07 ENCOUNTER — Inpatient Hospital Stay (HOSPITAL_COMMUNITY)
Admission: AD | Admit: 2020-03-07 | Discharge: 2020-03-07 | Disposition: A | Discharge status: Home / Self Care | Payer: Medicare Other | Attending: Obstetrics & Gynecology | Admitting: Obstetrics & Gynecology

## 2020-03-07 ENCOUNTER — Ambulatory Visit (INDEPENDENT_AMBULATORY_CARE_PROVIDER_SITE_OTHER): Payer: Medicare Other | Admitting: Family Medicine

## 2020-03-07 ENCOUNTER — Ambulatory Visit (INDEPENDENT_AMBULATORY_CARE_PROVIDER_SITE_OTHER): Payer: Medicare Other | Admitting: *Deleted

## 2020-03-07 ENCOUNTER — Encounter: Payer: Self-pay | Admitting: Obstetrics & Gynecology

## 2020-03-07 VITALS — BP 117/87 | HR 97 | Wt 246.2 lb

## 2020-03-07 DIAGNOSIS — O479 False labor, unspecified: Secondary | ICD-10-CM

## 2020-03-07 DIAGNOSIS — O99343 Other mental disorders complicating pregnancy, third trimester: Secondary | ICD-10-CM

## 2020-03-07 DIAGNOSIS — F3132 Bipolar disorder, current episode depressed, moderate: Secondary | ICD-10-CM

## 2020-03-07 DIAGNOSIS — O133 Gestational [pregnancy-induced] hypertension without significant proteinuria, third trimester: Secondary | ICD-10-CM

## 2020-03-07 DIAGNOSIS — O0990 Supervision of high risk pregnancy, unspecified, unspecified trimester: Secondary | ICD-10-CM

## 2020-03-07 DIAGNOSIS — Z3A35 35 weeks gestation of pregnancy: Secondary | ICD-10-CM | POA: Diagnosis not present

## 2020-03-07 LAB — URINALYSIS, ROUTINE W REFLEX MICROSCOPIC
Bilirubin Urine: NEGATIVE
Glucose, UA: NEGATIVE mg/dL
Hgb urine dipstick: NEGATIVE
Ketones, ur: 5 mg/dL — AB
Nitrite: NEGATIVE
Protein, ur: 100 mg/dL — AB
Specific Gravity, Urine: 1.032 — ABNORMAL HIGH (ref 1.005–1.030)
pH: 5 (ref 5.0–8.0)

## 2020-03-07 LAB — POCT URINALYSIS DIP (DEVICE)
Glucose, UA: NEGATIVE mg/dL
Hgb urine dipstick: NEGATIVE
Ketones, ur: 15 mg/dL — AB
Nitrite: NEGATIVE
Protein, ur: 30 mg/dL — AB
Specific Gravity, Urine: 1.025 (ref 1.005–1.030)
Urobilinogen, UA: 1 mg/dL (ref 0.0–1.0)
pH: 7 (ref 5.0–8.0)

## 2020-03-07 NOTE — Progress Notes (Signed)
Pt reports having elevated BP earlier today @ home. Currently she has a mild H/A - pain scale 4. She denies visual disturbances.

## 2020-03-07 NOTE — Telephone Encounter (Signed)
Patient seen and assessed by nursing staff.  Agree with documentation and plan.  

## 2020-03-07 NOTE — Progress Notes (Signed)
    PRENATAL VISIT NOTE  Subjective:  [name] is a 34 y.o. [id number] at [redacted]w[redacted]d being seen today for ongoing prenatal care.  She is currently monitored for the following issues for this high-risk pregnancy and has Borderline behavior; GERD (gastroesophageal reflux disease); Bipolar affective disorder, depressed, moderate degree ([place]); Obesity (BMI 30-39.9); OSA (obstructive sleep apnea); Vaginal venereal warts; Anxiety state; Asthma; Consecutive esotropia; Intellectual disability; ADHD (attention deficit hyperactivity disorder), inattentive type; Hx of preeclampsia, prior pregnancy, currently pregnant; History of preterm delivery after IOL for preeclampsia, currently pregnant; History of gestational diabetes in prior pregnancy, currently pregnant; Short interval between pregnancies affecting pregnancy, antepartum; Supervision of high risk pregnancy, antepartum; Nausea and vomiting during pregnancy; and Gestational hypertension, third trimester on their problem list.  Patient reports no complaints.  Contractions: Irregular. Vag. Bleeding: None.  Movement: Present. Denies leaking of fluid.   The following portions of the patient's history were reviewed and updated as appropriate: allergies, current medications, past family history, past medical history, past social history, past surgical history and problem list.   Objective:   Vitals:   03/07/20 1419  BP: 117/87  Pulse: 97  Weight: 246 lb 3.2 oz (111.7 kg)    Fetal Status: Fetal Heart Rate (bpm): NST   Movement: Present     General:  Alert, oriented and cooperative. Patient is in no acute distress.  Skin: Skin is warm and dry. No rash noted.   Cardiovascular: Normal heart rate noted  Respiratory: Normal respiratory effort, no problems with respiration noted  Abdomen: Soft, gravid, appropriate for gestational age.  Pain/Pressure: Present     Pelvic: Cervical exam performed in the presence of a chaperone        Extremities: Normal  range of motion.     Mental Status: Normal mood and affect. Normal behavior. Normal judgment and thought content.  NST:  Baseline: 145 bpm, Variability: Good {> 6 bpm) and Accelerations: Reactive   Assessment and Plan:  Pregnancy: [id number] at [redacted]w[redacted]d 1. Gestational hypertension, third trimester Weekly labs--next with IOL - CBC - Comprehensive metabolic panel - Protein / creatinine ratio, urine  2. Bipolar affective disorder, depressed, moderate degree (HCC) Continue Seroquel and Zoloft  3. Supervision of high risk pregnancy, antepartum Check GBS--has GC/Chlam already in MAU - Strep Gp B NAA  Preterm labor symptoms and general obstetric precautions including but not limited to vaginal bleeding, contractions, leaking of fluid and fetal movement were reviewed in detail with the patient. Please refer to After Visit Summary for other counseling recommendations.   Return in 1 week (on 03/14/2020) for in person for NST.  Future Appointments  Date Time Provider [place]  03/14/2020  9:30 AM MC-SCREENING MC-SDSC None  03/14/2020  3:15 PM WOC-WOCA NST WOC-WOCA WOC  03/16/2020 12:00 AM MC-LD SCHED ROOM MC-INDC None    Donnamae Jude, MD

## 2020-03-07 NOTE — MAU Provider Note (Signed)
Chief Complaint:  Contractions   First Provider Initiated Contact with Patient 03/07/20 2058     HPI: [name] is a 34 y.o. [id number] at 30w5dho presents to maternity admissions reporting uterine contractions.  States some of them are painful.  Asks if we would induce her early.  . She reports good fetal movement, denies LOF, vaginal bleeding, vaginal itching/burning, urinary symptoms, h/a, dizziness, n/v, diarrhea, constipation or fever/chills.  She denies headache, visual changes or RUQ abdominal pain.  Abdominal Pain This is a recurrent problem. The onset quality is gradual. The problem occurs intermittently. The problem has been unchanged. The pain is mild. The quality of the pain is cramping. The abdominal pain does not radiate. Pertinent negatives include no constipation, diarrhea, dysuria, fever, frequency, myalgias, nausea or vomiting. Nothing aggravates the pain. The pain is relieved by nothing. She has tried nothing for the symptoms.    RN Note: Had NST today at office about 1400 and was having some ctxs then. After getting home ctx continued and are now stronger. Denies LOF or VB. Some clear d/c.States is being treated for uti and was 1cm today at office.   Past Medical History: Past Medical History:  Diagnosis Date  . Anxiety   . Asthma    inhaler PRN-hasn't used inhaler in months  . Attention deficit hyperactivity disorder   . Bipolar disorder ([facility]    hospitalized as a teen for suicidal ideations and cutting  . Depression    no complaints, doing well  . Gastritis   . GERD (gastroesophageal reflux disease)   . HPV (human papilloma virus) anogenital infection   . Hx of varicella   . Mild preeclampsia 02/09/2016  . Pregnancy induced hypertension   . Vaginal Pap smear, abnormal     Past obstetric history: OB History  Gravida Para Term Preterm AB Living  6 4 3 1 1 4   SAB TAB Ectopic Multiple Live Births  1     0 4    # Outcome Date GA Lbr Len/2nd Weight Sex  Delivery Anes PTL Lv  6 Current           5 SAB 05/2019          4 Term 12/17/18 [id number] g  Vag-Spont   LIV     Birth Comments: Hypertension  3 Term 02/19/18 378w2d6:02 / 01:46 3790 g M Vag-Spont EPI  LIV     Birth Comments: hypertension  2 Preterm 02/03/17 35w4w6d00 g M Vag-Spont EPI  LIV     Birth Comments: hypertension  1 Term 02/11/16 38w11w1d:14 3702 g F Vag-Spont EPI  LIV     Birth Comments: preeclampsia    Past Surgical History: Past Surgical History:  Procedure Laterality Date  . EYE MUSCLE SURGERY Bilateral    07/29/12  . MOUTH SURGERY    . PLANTAR FASCIA SURGERY    . WISDOM TOOTH EXTRACTION      Family History: Family History  Problem Relation Age of Onset  . Depression Mother   . Colon cancer Mother   . Colon polyps Mother   . Depression Father   . Lung cancer Father   . Depression Brother   . Liver disease Other   . Kidney disease Other     Social History: Social History   Tobacco Use  . Smoking status: Never Smoker  . Smokeless tobacco: Never Used  Substance Use Topics  . Alcohol use: No    Alcohol/week: 0.0  standard drinks  . Drug use: No    Allergies:  Allergies  Allergen Reactions  . Depakote [Divalproex Sodium] Other (See Comments)    Pt states that this medication makes her blood levels toxic.    [name] Methylphenidate Derivatives Other (See Comments)    Reaction:  Depression and anger   . Neurontin [Gabapentin] Other (See Comments)    Reaction:  Dizziness   . Prozac [Fluoxetine Hcl] Other (See Comments)    Reaction:  Anger   . Methylphenidate Hcl     SAME AS PREVIOUS LISTED METHYLPHENIDATE    Meds:  Medications Prior to Admission  Medication Sig Dispense Refill Last Dose  . aspirin EC 81 MG tablet Take 1 tablet (81 mg total) by mouth daily. Take after 12 weeks for prevention of preeclampsia later in pregnancy 300 tablet 2   . Blood Pressure Monitoring (BLOOD PRESSURE KIT) DEVI 1 Device by Does not apply route as needed. ICD 10  O09.90 1 Device 0   . cefadroxil (DURICEF) 500 MG capsule Take 1 capsule (500 mg total) by mouth 2 (two) times daily for 7 days. 14 capsule 0   . Elastic Bandages & Supports (COMFORT FIT MATERNITY SUPP LG) MISC 1 Units by Does not apply route daily as needed. (Patient not taking: Reported on 03/07/2020) 1 each 0   . famotidine (PEPCID) 20 MG tablet Take 1 tablet (20 mg total) by mouth 2 (two) times daily. 60 tablet 1   . Prenatal Vit w/Fe-Methylfol-FA (PNV PO) Take by mouth.     . promethazine (PHENERGAN) 25 MG tablet Take 1 tablet (25 mg total) by mouth every 6 (six) hours as needed for nausea or vomiting. 30 tablet 0   . QUEtiapine (SEROQUEL) 25 MG tablet Take 25 mg by mouth at bedtime.     . Sertraline HCl (ZOLOFT PO) Take 10 mg by mouth at bedtime.       I have reviewed patient's Past Medical Hx, Surgical Hx, Family Hx, Social Hx, medications and allergies.   ROS:  Review of Systems  Constitutional: Negative for fever.  Gastrointestinal: Positive for abdominal pain. Negative for constipation, diarrhea, nausea and vomiting.  Genitourinary: Negative for dysuria and frequency.  Musculoskeletal: Negative for myalgias.   Other systems negative  Physical Exam   Patient Vitals for the past 24 hrs:  BP Temp Pulse Resp Height Weight  03/07/20 2046 127/82 -- (!) 110 -- -- --  03/07/20 2044 -- 98.2 F (36.8 C) -- 18 5' 3"  (1.6 m) 112.5 kg   Vitals:   03/07/20 2116 03/07/20 2131 03/07/20 2146 03/07/20 2201  BP: (!) 124/92 (!) 132/94 111/68 108/72  Pulse: (!) 126 (!) 110 (!) 110 95  Resp:      Temp:      SpO2:      Weight:      Height:        Constitutional: Well-developed, well-nourished female in no acute distress.  Cardiovascular: normal rate and rhythm Respiratory: normal effort, clear to auscultation bilaterally GI: Abd soft, non-tender, gravid appropriate for gestational age.   No rebound or guarding. MS: Extremities nontender, no edema, normal ROM Neurologic: Alert and  oriented x 4.  GU: Neg CVAT.  PELVIC EXAM:    Dilation: 1 Effacement (%): 20 Cervical Position: Posterior Station: Ballotable, -3 Presentation: Vertex Exam by:: Shadell Brenn CNM  FHT:  Baseline 140 , moderate variability, accelerations present, no decelerations Contractions: uterine irritability with intermittent contractions   Labs: Results for orders placed or performed in  visit on 03/07/20 (from the past 24 hour(s))  POCT urinalysis dip (device)     Status: Abnormal   Collection Time: 03/07/20  2:51 PM  Result Value Ref Range   Glucose, UA NEGATIVE NEGATIVE mg/dL   Bilirubin Urine SMALL (A) NEGATIVE   Ketones, ur 15 (A) NEGATIVE mg/dL   Specific Gravity, Urine 1.025 1.005 - 1.030   Hgb urine dipstick NEGATIVE NEGATIVE   pH 7.0 5.0 - 8.0   Protein, ur 30 (A) NEGATIVE mg/dL   Urobilinogen, UA 1.0 0.0 - 1.0 mg/dL   Nitrite NEGATIVE NEGATIVE   Leukocytes,Ua TRACE (A) NEGATIVE   O/Positive/-- (11/09 1813)  Imaging:    MAU Course/MDM: I have ordered labs and reviewed results.  NST reviewed, reactive throughout.  Uterine irritability.  No evidence of active preterm labor Discussed reasons to wait until 37 weeks to induce labor Reassured blood pressures are mostly normal  Treatments in MAU included EFM.    Assessment: Single intrauterine pregnancy at 56w5dUterine irritability Not in labor Labile gestational hypertension with no evidence of preeclampsia  Plan: Discharge home Labor precautions and fetal kick counts Follow up in Office for prenatal visits   Encouraged to return here or to other Urgent Care/ED if she develops worsening of symptoms, increase in pain, fever, or other concerning symptoms.  Pt stable at time of discharge.  MHansel FeinsteinCNM, MSN Certified Nurse-Midwife 03/07/2020 8:58 PM

## 2020-03-07 NOTE — Patient Instructions (Addendum)

## 2020-03-07 NOTE — MAU Note (Addendum)
Patient came to nurses desk and stated she had to leave AMA forms signed with patient. No vitals were obtained before d/c.

## 2020-03-07 NOTE — Progress Notes (Signed)
Message sent to scheduling provider for induction orders  Merilyn Baba, DO OB Fellow, [facility] 03/07/2020 11:52 AM

## 2020-03-07 NOTE — MAU Note (Addendum)
Had NST today at office about 1400 and was having some ctxs then. After getting home ctx continued and are now stronger. Denies LOF or VB. Some clear d/c.States is being treated for uti and was 1cm today at office.

## 2020-03-07 NOTE — Discharge Instructions (Signed)
Hypertension During Pregnancy High blood pressure (hypertension) is when the force of blood pumping through the arteries is too strong. Arteries are blood vessels that carry blood from the heart throughout the body. Hypertension during pregnancy can be mild or severe. Severe hypertension during pregnancy (preeclampsia) is a medical emergency that requires prompt evaluation and treatment. Different types of hypertension can happen during pregnancy. These include:  Chronic hypertension. This happens when you had high blood pressure before you became pregnant, and it continues during the pregnancy. Hypertension that develops before you are [redacted] weeks pregnant and continues during the pregnancy is also called chronic hypertension. If you have chronic hypertension, it will not go away after you have your baby. You will need follow-up visits with your health care provider after you have your baby. Your doctor may want you to keep taking medicine for your blood pressure.  Gestational hypertension. This is hypertension that develops after the 20th week of pregnancy. Gestational hypertension usually goes away after you have your baby, but your health care provider will need to monitor your blood pressure to make sure that it is getting better.  Preeclampsia. This is severe hypertension during pregnancy. This can cause serious complications for you and your baby and can also cause complications for you after the delivery of your baby.  Postpartum preeclampsia. You may develop severe hypertension after giving birth. This usually occurs within 48 hours after childbirth but may occur up to 6 weeks after giving birth. This is rare. How does this affect me? Women who have hypertension during pregnancy have a greater chance of developing hypertension later in life or during future pregnancies. In some cases, hypertension during pregnancy can cause serious complications, such as:  Stroke.  Heart attack.  Injury to  other organs, such as kidneys, lungs, or liver.  Preeclampsia.  Convulsions or seizures.  Placental abruption. How does this affect my baby? Hypertension during pregnancy can affect your baby. Your baby may:  Be born early (prematurely).  Not weigh as much as he or she should at birth (low birth weight).  Not tolerate labor well, leading to an unplanned cesarean delivery. What are the risks? There are certain factors that make it more likely for you to develop hypertension during pregnancy. These include:  Having hypertension during a previous pregnancy.  Being overweight.  Being age 35 or older.  Being pregnant for the first time.  Being pregnant with more than one baby.  Becoming pregnant using fertilization methods, such as IVF (in vitro fertilization).  Having other medical problems, such as diabetes, kidney disease, or lupus.  Having a family history of hypertension. What can I do to lower my risk? The exact cause of hypertension during pregnancy is not known. You may be able to lower your risk by:  Maintaining a healthy weight.  Eating a healthy and balanced diet.  Following your health care provider's instructions about treating any long-term conditions that you had before becoming pregnant. It is very important to keep all of your prenatal care appointments. Your health care provider will check your blood pressure and make sure that your pregnancy is progressing as expected. If a problem is found, early treatment can prevent complications. How is this treated? Treatment for hypertension during pregnancy varies depending on the type of hypertension you have and how serious it is.  If you were taking medicine for high blood pressure before you became pregnant, talk with your health care provider. You may need to change medicine during pregnancy because   some medicines, like ACE inhibitors, may not be considered safe for your baby.  If you have gestational  hypertension, your health care provider may order medicine to treat this during pregnancy.  If you are at risk for preeclampsia, your health care provider may recommend that you take a low-dose aspirin during your pregnancy.  If you have severe hypertension, you may need to be hospitalized so you and your baby can be monitored closely. You may also need to be given medicine to lower your blood pressure. This medicine may be given by mouth or through an IV.  In some cases, if your condition gets worse, you may need to deliver your baby early. Follow these instructions at home: Eating and drinking   Drink enough fluid to keep your urine pale yellow.  Avoid caffeine. Lifestyle  Do not use any products that contain nicotine or tobacco, such as cigarettes, e-cigarettes, and chewing tobacco. If you need help quitting, ask your health care provider.  Do not use alcohol or drugs.  Avoid stress as much as possible.  Rest and get plenty of sleep.  Regular exercise can help to reduce your blood pressure. Ask your health care provider what kinds of exercise are best for you. General instructions  Take over-the-counter and prescription medicines only as told by your health care provider.  Keep all prenatal and follow-up visits as told by your health care provider. This is important. Contact a health care provider if:  You have symptoms that your health care provider told you may require more treatment or monitoring, such as: ? Headaches. ? Nausea or vomiting. ? Abdominal pain. ? Dizziness. ? Light-headedness. Get help right away if:  You have: ? Severe abdominal pain that does not get better with treatment. ? A severe headache that does not get better. ? Vomiting that does not get better. ? Sudden, rapid weight gain. ? Sudden swelling in your hands, ankles, or face. ? Vaginal bleeding. ? Blood in your urine. ? Blurred or double vision. ? Shortness of breath or chest  pain. ? Weakness on one side of your body. ? Difficulty speaking.  Your baby is not moving as much as usual. Summary  High blood pressure (hypertension) is when the force of blood pumping through the arteries is too strong.  Hypertension during pregnancy can cause problems for you and your baby.  Treatment for hypertension during pregnancy varies depending on the type of hypertension you have and how serious it is.  Keep all prenatal and follow-up visits as told by your health care provider. This is important. This information is not intended to replace advice given to you by your health care provider. Make sure you discuss any questions you have with your health care provider. Document Revised: 03/24/2019 Document Reviewed: 12/28/2018 Elsevier Patient Education  2020 [employer]  

## 2020-03-07 NOTE — Telephone Encounter (Signed)
Patient reports she was sitting on the couch when she took her BP. BP are reading high per patient.   Patient reports she is not having blurred vision. She is having some dizziness.  She reports she is feeling weak, she had a HA yesterday that is gone today.   Patient reports that she is coming to the office today and that her transportation is coming to get her at 1:30. She was advised to ask the transportation to take her to the hospital. Patient reports she cannot change locations of where they transport her to.   After speaking with Dr. Kennon Rounds, patient was advised to come to her appointment in the office to be assessed and we can then get her transportation to the hospital if warranted. Patient wants to know if she will be admitted and induced, informed patient that we do not know at this moment and a decision for care will be decided once she comes to the office.

## 2020-03-08 ENCOUNTER — Telehealth: Payer: Self-pay | Admitting: Family Medicine

## 2020-03-08 LAB — CBC
Hematocrit: 41 % (ref 34.0–46.6)
Hemoglobin: 13.6 g/dL (ref 11.1–15.9)
MCH: 30.2 pg (ref 26.6–33.0)
MCHC: 33.2 g/dL (ref 31.5–35.7)
MCV: 91 fL (ref 79–97)
Platelets: 181 10*3/uL (ref 150–450)
RBC: 4.51 x10E6/uL (ref 3.77–5.28)
RDW: 12.2 % (ref 11.7–15.4)
WBC: 9.3 10*3/uL (ref 3.4–10.8)

## 2020-03-08 LAB — COMPREHENSIVE METABOLIC PANEL
ALT: 7 IU/L (ref 0–32)
AST: 13 IU/L (ref 0–40)
Albumin/Globulin Ratio: 1.5 (ref 1.2–2.2)
Albumin: 4 g/dL (ref 3.8–4.8)
Alkaline Phosphatase: 169 IU/L — ABNORMAL HIGH (ref 39–117)
BUN/Creatinine Ratio: 7 — ABNORMAL LOW (ref 9–23)
BUN: 5 mg/dL — ABNORMAL LOW (ref 6–20)
Bilirubin Total: 0.4 mg/dL (ref 0.0–1.2)
CO2: 21 mmol/L (ref 20–29)
Calcium: 9.2 mg/dL (ref 8.7–10.2)
Chloride: 102 mmol/L (ref 96–106)
Creatinine, Ser: 0.71 mg/dL (ref 0.57–1.00)
GFR calc Af Amer: 130 mL/min/{1.73_m2} (ref >59)
GFR calc non Af Amer: 113 mL/min/{1.73_m2} (ref >59)
Globulin, Total: 2.7 g/dL (ref 1.5–4.5)
Glucose: 79 mg/dL (ref 65–99)
Potassium: 4.7 mmol/L (ref 3.5–5.2)
Sodium: 139 mmol/L (ref 134–144)
Total Protein: 6.7 g/dL (ref 6.0–8.5)

## 2020-03-08 LAB — PROTEIN / CREATININE RATIO, URINE
Creatinine, Urine: 208.3 mg/dL
Protein, Ur: 55.4 mg/dL
Protein/Creat Ratio: 266 mg/g creat — ABNORMAL HIGH (ref 0–200)

## 2020-03-08 NOTE — Telephone Encounter (Signed)
Please call patient about some test results.

## 2020-03-08 NOTE — Telephone Encounter (Signed)
Pt also sent message via MyChart with her questions regarding test results. Response was sent via MyChart.

## 2020-03-09 LAB — STREP GP B NAA: Strep Gp B NAA: NEGATIVE

## 2020-03-10 ENCOUNTER — Ambulatory Visit (HOSPITAL_COMMUNITY)
Admission: AD | Admit: 2020-03-10 | Discharge: 2020-03-10 | Disposition: A | Discharge status: Home / Self Care | Payer: Medicare Other | Attending: Obstetrics & Gynecology | Admitting: Obstetrics & Gynecology

## 2020-03-10 ENCOUNTER — Telehealth (HOSPITAL_COMMUNITY): Payer: Self-pay

## 2020-03-10 ENCOUNTER — Other Ambulatory Visit: Payer: Self-pay

## 2020-03-10 ENCOUNTER — Encounter (HOSPITAL_COMMUNITY): Payer: Self-pay | Admitting: Obstetrics & Gynecology

## 2020-03-10 DIAGNOSIS — G4489 Other headache syndrome: Secondary | ICD-10-CM

## 2020-03-10 DIAGNOSIS — O99353 Diseases of the nervous system complicating pregnancy, third trimester: Secondary | ICD-10-CM

## 2020-03-10 DIAGNOSIS — R519 Headache, unspecified: Secondary | ICD-10-CM | POA: Diagnosis present

## 2020-03-10 DIAGNOSIS — Z3A36 36 weeks gestation of pregnancy: Secondary | ICD-10-CM

## 2020-03-10 DIAGNOSIS — O26893 Other specified pregnancy related conditions, third trimester: Secondary | ICD-10-CM | POA: Diagnosis not present

## 2020-03-10 LAB — COMPREHENSIVE METABOLIC PANEL
ALT: 7 U/L (ref 0–44)
AST: 12 U/L — ABNORMAL LOW (ref 15–41)
Albumin: 2.6 g/dL — ABNORMAL LOW (ref 3.5–5.0)
Alkaline Phosphatase: 119 U/L (ref 38–126)
Anion gap: 9 (ref 5–15)
BUN: 7 mg/dL (ref 6–20)
CO2: 23 mmol/L (ref 22–32)
Calcium: 8.7 mg/dL — ABNORMAL LOW (ref 8.9–10.3)
Chloride: 106 mmol/L (ref 98–111)
Creatinine, Ser: 0.72 mg/dL (ref 0.44–1.00)
GFR calc Af Amer: >60 mL/min (ref >60)
GFR calc non Af Amer: >60 mL/min (ref >60)
Glucose, Bld: 96 mg/dL (ref 70–99)
Potassium: 4.5 mmol/L (ref 3.5–5.1)
Sodium: 138 mmol/L (ref 135–145)
Total Bilirubin: 0.4 mg/dL (ref 0.3–1.2)
Total Protein: 5.7 g/dL — ABNORMAL LOW (ref 6.5–8.1)

## 2020-03-10 LAB — PROTEIN / CREATININE RATIO, URINE
Creatinine, Urine: 237.78 mg/dL
Protein Creatinine Ratio: 0.14 mg/mg{Cre} (ref 0.00–0.15)
Total Protein, Urine: 34 mg/dL

## 2020-03-10 LAB — CBC
HCT: 36.4 % (ref 36.0–46.0)
Hemoglobin: 11.8 g/dL — ABNORMAL LOW (ref 12.0–15.0)
MCH: 29.5 pg (ref 26.0–34.0)
MCHC: 32.4 g/dL (ref 30.0–36.0)
MCV: 91 fL (ref 80.0–100.0)
Platelets: 174 10*3/uL (ref 150–400)
RBC: 4 MIL/uL (ref 3.87–5.11)
RDW: 12.5 % (ref 11.5–15.5)
WBC: 6.9 10*3/uL (ref 4.0–10.5)
nRBC: 0 % (ref 0.0–0.2)

## 2020-03-10 LAB — URINALYSIS, ROUTINE W REFLEX MICROSCOPIC
Bilirubin Urine: NEGATIVE
Glucose, UA: NEGATIVE mg/dL
Hgb urine dipstick: NEGATIVE
Ketones, ur: NEGATIVE mg/dL
Nitrite: NEGATIVE
Protein, ur: 100 mg/dL — AB
Specific Gravity, Urine: 1.023 (ref 1.005–1.030)
pH: 6 (ref 5.0–8.0)

## 2020-03-10 MED ORDER — DIPHENHYDRAMINE HCL 50 MG/ML IJ SOLN
12.5000 mg | Freq: Once | INTRAMUSCULAR | Status: AC
  Administered 2020-03-10: 12.5 mg via INTRAVENOUS
  Filled 2020-03-10: qty 1

## 2020-03-10 MED ORDER — LACTATED RINGERS IV BOLUS
1000.0000 mL | Freq: Once | INTRAVENOUS | Status: AC
  Administered 2020-03-10: 1000 mL via INTRAVENOUS

## 2020-03-10 MED ORDER — METOCLOPRAMIDE HCL 5 MG/ML IJ SOLN
10.0000 mg | Freq: Once | INTRAMUSCULAR | Status: AC
  Administered 2020-03-10: 10 mg via INTRAVENOUS
  Filled 2020-03-10: qty 2

## 2020-03-10 MED ORDER — DEXAMETHASONE SODIUM PHOSPHATE 10 MG/ML IJ SOLN
10.0000 mg | Freq: Once | INTRAMUSCULAR | Status: AC
  Administered 2020-03-10: 10 mg via INTRAVENOUS
  Filled 2020-03-10: qty 1

## 2020-03-10 MED ORDER — BUTALBITAL-APAP-CAFFEINE 50-325-40 MG PO TABS
2.0000 | ORAL_TABLET | Freq: Once | ORAL | Status: AC
  Administered 2020-03-10: 2 via ORAL
  Filled 2020-03-10: qty 2

## 2020-03-10 NOTE — Discharge Instructions (Signed)
Hypertension During Pregnancy Hypertension is also called high blood pressure. High blood pressure means that the force of your blood moving in your body is too strong. It can cause problems for you and your baby. Different types of high blood pressure can happen during pregnancy. The types are:  High blood pressure before you got pregnant. This is called chronic hypertension.  This can continue during your pregnancy. Your doctor will want to keep checking your blood pressure. You may need medicine to keep your blood pressure under control while you are pregnant. You will need follow-up visits after you have your baby.  High blood pressure that goes up during pregnancy when it was normal before. This is called gestational hypertension. It will usually get better after you have your baby, but your doctor will need to watch your blood pressure to make sure that it is getting better.  Very high blood pressure during pregnancy. This is called preeclampsia. Very high blood pressure is an emergency that needs to be checked and treated right away.  You may develop very high blood pressure after giving birth. This is called postpartum preeclampsia. This usually occurs within 48 hours after childbirth but may occur up to 6 weeks after giving birth. This is rare. How does this affect me? If you have high blood pressure during pregnancy, you have a higher chance of developing high blood pressure:  As you get older.  If you get pregnant again. In some cases, high blood pressure during pregnancy can cause:  Stroke.  Heart attack.  Damage to the kidneys, lungs, or liver.  Preeclampsia.  Jerky movements you cannot control (convulsions or seizures).  Problems with the placenta. How does this affect my baby? Your baby may:  Be born early.  Not weigh as much as he or she should.  Not handle labor well, leading to a c-section birth. What are the risks?  Having high blood pressure during a past  pregnancy.  Being overweight.  Being 35 years old or older.  Being pregnant for the first time.  Being pregnant with more than one baby.  Becoming pregnant using fertility methods, such as IVF.  Having other problems, such as diabetes, or kidney disease.  Having family members who have high blood pressure. What can I do to lower my risk?   Keep a healthy weight.  Eat a healthy diet.  Follow what your doctor tells you about treating any medical problems that you had before becoming pregnant. It is very important to go to all of your doctor visits. Your doctor will check your blood pressure and make sure that your pregnancy is progressing as it should. Treatment should start early if a problem is found. How is this treated? Treatment for high blood pressure during pregnancy can differ depending on the type of high blood pressure you have and how serious it is.  You may need to take blood pressure medicine.  If you have been taking medicine for your blood pressure, you may need to change the medicine during pregnancy if it is not safe for your baby.  If your doctor thinks that you could get very high blood pressure, he or she may tell you to take a low-dose aspirin during your pregnancy.  If you have very high blood pressure, you may need to stay in the hospital so you and your baby can be watched closely. You may also need to take medicine to lower your blood pressure. This medicine may be given by mouth   or through an IV tube.  In some cases, if your condition gets worse, you may need to have your baby early. Follow these instructions at home: Eating and drinking   Drink enough fluid to keep your pee (urine) pale yellow.  Avoid caffeine. Lifestyle  Do not use any products that contain nicotine or tobacco, such as cigarettes, e-cigarettes, and chewing tobacco. If you need help quitting, ask your doctor.  Do not use alcohol or drugs.  Avoid stress.  Rest and get plenty  of sleep.  Regular exercise can help. Ask your doctor what kinds of exercise are best for you. General instructions  Take over-the-counter and prescription medicines only as told by your doctor.  Keep all prenatal and follow-up visits as told by your doctor. This is important. Contact a doctor if:  You have symptoms that your doctor told you to watch for, such as: ? Headaches. ? Nausea. ? Vomiting. ? Belly (abdominal) pain. ? Dizziness. ? Light-headedness. Get help right away if:  You have: ? Very bad belly pain that does not get better with treatment. ? A very bad headache that does not get better. ? Vomiting that does not get better. ? Sudden, fast weight gain. ? Sudden swelling in your hands, ankles, or face. ? Bleeding from your vagina. ? Blood in your pee. ? Blurry vision. ? Double vision. ? Shortness of breath. ? Chest pain. ? Weakness on one side of your body. ? Trouble talking.  Your baby is not moving as much as usual. Summary  High blood pressure is also called hypertension.  High blood pressure means that the force of your blood moving in your body is too strong.  High blood pressure can cause problems for you and your baby.  Keep all follow-up visits as told by your doctor. This is important. This information is not intended to replace advice given to you by your health care provider. Make sure you discuss any questions you have with your health care provider. Document Revised: 03/24/2019 Document Reviewed: 12/28/2018 Elsevier Patient Education  2020 [employer] Inc.  

## 2020-03-10 NOTE — Telephone Encounter (Signed)
Pt called [facility]U and verified her identity with name and birthday. She was upset about notes that were from her encounter today entered by the care team. She stated one of the noted stated she refused to be monitored which she stated was not true. I was unable to locate this note. She also stated that a question she had asked about her being able to stay of her headache was not gone, this was found in documentation but the pt stated in wasn't exactly what she had said. I told her the nurse and providers that had cared for her today were no longer here and that I could not change any noted in her chart that were not my own, and apologized that I could not help her any further. I suggested she try and send a message through her my chart regarding any concerns.

## 2020-03-10 NOTE — MAU Provider Note (Signed)
Patient [name] is a 34 y.o. [phone]  at 11w1dhere with complaints headache, blurred vision, seeing spots, irregular contractions. She denies LOF, decreased fetal movements. She has a pregnancy complicated by gestational hypertension; scheduled for IOL at 37 weeks.   Patient states to RN "If I say I have a headache, will they keep me?". She is asking questions about her proteinuria and hoping that it is elevated so that she may be induced today. I explained that at 36.1 we would not induce unless medically necessary. We will try to treat her headache and wait for labs to return.   Patient is well-appearing, ambulating, eating and talking in bed.   Patient was seen in the MAU on 3/24; requesting to be induced. Patient has a known history of requesting pre-term inductions in her past pregnancies, as well as CPS involvement. She has had multiple visit to MAU throughout the courses of her pregnancies.   History     CSN: [id number] Arrival date and time: 03/10/20 1147   None     Chief Complaint  Patient presents with  . Contractions  . Headache  . Decreased Fetal Movement  . Blurred Vision   Headache  This is a new problem. The current episode started yesterday. The problem occurs constantly. The pain is at a severity of 9/10. Associated symptoms include blurred vision. Pertinent negatives include no nausea. Associated symptoms comments: Blurry vision started last night with headache. Nothing aggravates the symptoms. She has tried nothing for the symptoms.    OB History    Gravida  6   Para  4   Term  3   Preterm  1   AB  1   Living  4     SAB  1   TAB      Ectopic      Multiple  0   Live Births  4           Past Medical History:  Diagnosis Date  . Anxiety   . Asthma    inhaler PRN-hasn't used inhaler in months  . Attention deficit hyperactivity disorder   . Bipolar disorder ([facility]    hospitalized as a teen for suicidal ideations and cutting  .  Depression    no complaints, doing well  . Gastritis   . GERD (gastroesophageal reflux disease)   . HPV (human papilloma virus) anogenital infection   . Hx of varicella   . Mild preeclampsia 02/09/2016  . Pregnancy induced hypertension   . Vaginal Pap smear, abnormal     Past Surgical History:  Procedure Laterality Date  . EYE MUSCLE SURGERY Bilateral    07/29/12  . MOUTH SURGERY    . PLANTAR FASCIA SURGERY    . WISDOM TOOTH EXTRACTION      Family History  Problem Relation Age of Onset  . Depression Mother   . Colon cancer Mother   . Colon polyps Mother   . Depression Father   . Lung cancer Father   . Depression Brother   . Liver disease Other   . Kidney disease Other     Social History   Tobacco Use  . Smoking status: Never Smoker  . Smokeless tobacco: Never Used  Substance Use Topics  . Alcohol use: No    Alcohol/week: 0.0 standard drinks  . Drug use: No    Allergies:  Allergies  Allergen Reactions  . Depakote [Divalproex Sodium] Other (See Comments)    Pt states that this  medication makes her blood levels toxic.    [name] Methylphenidate Derivatives Other (See Comments)    Reaction:  Depression and anger   . Neurontin [Gabapentin] Other (See Comments)    Reaction:  Dizziness   . Prozac [Fluoxetine Hcl] Other (See Comments)    Reaction:  Anger   . Methylphenidate Hcl     SAME AS PREVIOUS LISTED METHYLPHENIDATE    Medications Prior to Admission  Medication Sig Dispense Refill Last Dose  . aspirin EC 81 MG tablet Take 1 tablet (81 mg total) by mouth daily. Take after 12 weeks for prevention of preeclampsia later in pregnancy 300 tablet 2   . Blood Pressure Monitoring (BLOOD PRESSURE KIT) DEVI 1 Device by Does not apply route as needed. ICD 10 O09.90 1 Device 0   . Elastic Bandages & Supports (COMFORT FIT MATERNITY SUPP LG) MISC 1 Units by Does not apply route daily as needed. (Patient not taking: Reported on 03/07/2020) 1 each 0   . famotidine (PEPCID) 20 MG  tablet Take 1 tablet (20 mg total) by mouth 2 (two) times daily. 60 tablet 1   . Prenatal Vit w/Fe-Methylfol-FA (PNV PO) Take by mouth.     . promethazine (PHENERGAN) 25 MG tablet Take 1 tablet (25 mg total) by mouth every 6 (six) hours as needed for nausea or vomiting. 30 tablet 0   . QUEtiapine (SEROQUEL) 25 MG tablet Take 25 mg by mouth at bedtime.     . Sertraline HCl (ZOLOFT PO) Take 10 mg by mouth at bedtime.       Review of Systems  Constitutional: Negative.   HENT: Negative.   Eyes: Positive for blurred vision.  Respiratory: Negative for shortness of breath.   Cardiovascular: Negative.   Gastrointestinal: Negative for diarrhea and nausea.  Genitourinary: Positive for vaginal bleeding.  Neurological: Positive for headaches.   Physical Exam   Blood pressure 110/77, pulse 69, temperature 97.8 F (36.6 C), temperature source Oral, resp. rate 18, last menstrual period 06/25/2019, SpO2 96 %, unknown if currently breastfeeding.  Physical Exam  Constitutional: She appears well-developed.  HENT:  Head: Normocephalic.  Respiratory: Effort normal.  GI: Soft. She exhibits no distension. There is no abdominal tenderness. There is no rebound and no guarding.  Musculoskeletal:        General: Normal range of motion.     Cervical back: Normal range of motion.  Neurological: She is alert.  Skin: Skin is warm.    MAU Course  Procedures  MDM -NST: 140 bpm, mod var, present acel, neg decels, no contractions. Cervix was 1 cm (per RN).   -CMP, CBC and PCR are all normal.   Patient complaining about not being admitted; she is tearful and saying that her pelvis hurts and that it hurts to walk. Dr. Roselie Awkward at the bedside to evaluate patient. Plan of care is to treat her headache and reassess in three hours.  1701: she is sleeping in bed when I arrived to assess her. Patient reports no decrease in her headache.   -Refused to be monitored and refused to have blood pressures taken while in  MAU.  Assessment and Plan   1. Other headache syndrome    2. Patient stable for discharge with reassuring labs and no severe range blood pressures; explained to patient that 36 weeks would not be a safe time to deliver baby, and that we would like to wait until she is 37 weeks. Provided emotional support and reassurance.   3. Keep follow  up NST on Wednesday, 3-31.   4. Return to MAU if anything changes or worsens.   Mervyn Skeeters Chinmay Squier 03/10/2020, 2:03 PM

## 2020-03-10 NOTE — MAU Note (Signed)
Pt reports to mau with c/o irregular ctx for the past few days.  Pt reports headache and blurred vision.  Pt reports she took tylenol last night with no relief.  Pt also reports dfm since yesterday.  Pt denies LOF or vag bleeding.

## 2020-03-10 NOTE — MAU Note (Signed)
Pt has repeatedly asked if she can be admitted for induction if she reports her headache was not resolved by medication.  This RN informed pt that she would communicated with cnm about her questions.

## 2020-03-12 ENCOUNTER — Encounter: Payer: Self-pay | Admitting: *Deleted

## 2020-03-12 ENCOUNTER — Inpatient Hospital Stay (HOSPITAL_COMMUNITY)
Admission: AD | Admit: 2020-03-12 | Discharge: 2020-03-13 | Disposition: A | Discharge status: Home / Self Care | Payer: Medicare Other | Attending: Obstetrics & Gynecology | Admitting: Obstetrics & Gynecology

## 2020-03-12 ENCOUNTER — Other Ambulatory Visit: Payer: Self-pay

## 2020-03-12 ENCOUNTER — Encounter (HOSPITAL_COMMUNITY): Payer: Self-pay | Admitting: Obstetrics & Gynecology

## 2020-03-12 DIAGNOSIS — O4703 False labor before 37 completed weeks of gestation, third trimester: Secondary | ICD-10-CM | POA: Insufficient documentation

## 2020-03-12 DIAGNOSIS — O133 Gestational [pregnancy-induced] hypertension without significant proteinuria, third trimester: Secondary | ICD-10-CM | POA: Diagnosis not present

## 2020-03-12 DIAGNOSIS — Z20822 Contact with and (suspected) exposure to covid-19: Secondary | ICD-10-CM | POA: Diagnosis not present

## 2020-03-12 DIAGNOSIS — O479 False labor, unspecified: Secondary | ICD-10-CM

## 2020-03-12 DIAGNOSIS — O134 Gestational [pregnancy-induced] hypertension without significant proteinuria, complicating childbirth: Secondary | ICD-10-CM | POA: Diagnosis not present

## 2020-03-12 DIAGNOSIS — Z3A36 36 weeks gestation of pregnancy: Secondary | ICD-10-CM | POA: Insufficient documentation

## 2020-03-12 LAB — COMPREHENSIVE METABOLIC PANEL
ALT: 10 U/L (ref 0–44)
AST: 13 U/L — ABNORMAL LOW (ref 15–41)
Albumin: 2.6 g/dL — ABNORMAL LOW (ref 3.5–5.0)
Alkaline Phosphatase: 126 U/L (ref 38–126)
Anion gap: 6 (ref 5–15)
BUN: 7 mg/dL (ref 6–20)
CO2: 24 mmol/L (ref 22–32)
Calcium: 8.6 mg/dL — ABNORMAL LOW (ref 8.9–10.3)
Chloride: 107 mmol/L (ref 98–111)
Creatinine, Ser: 0.78 mg/dL (ref 0.44–1.00)
GFR calc Af Amer: >60 mL/min (ref >60)
GFR calc non Af Amer: >60 mL/min (ref >60)
Glucose, Bld: 94 mg/dL (ref 70–99)
Potassium: 5.2 mmol/L — ABNORMAL HIGH (ref 3.5–5.1)
Sodium: 137 mmol/L (ref 135–145)
Total Bilirubin: 0.5 mg/dL (ref 0.3–1.2)
Total Protein: 5.5 g/dL — ABNORMAL LOW (ref 6.5–8.1)

## 2020-03-12 LAB — CBC
HCT: 35.5 % — ABNORMAL LOW (ref 36.0–46.0)
Hemoglobin: 11.5 g/dL — ABNORMAL LOW (ref 12.0–15.0)
MCH: 29.7 pg (ref 26.0–34.0)
MCHC: 32.4 g/dL (ref 30.0–36.0)
MCV: 91.7 fL (ref 80.0–100.0)
Platelets: 171 10*3/uL (ref 150–400)
RBC: 3.87 MIL/uL (ref 3.87–5.11)
RDW: 12.8 % (ref 11.5–15.5)
WBC: 7.1 10*3/uL (ref 4.0–10.5)
nRBC: 0.4 % — ABNORMAL HIGH (ref 0.0–0.2)

## 2020-03-12 LAB — PROTEIN / CREATININE RATIO, URINE
Creatinine, Urine: 348.34 mg/dL
Protein Creatinine Ratio: 0.19 mg/mg{Cre} — ABNORMAL HIGH (ref 0.00–0.15)
Total Protein, Urine: 65 mg/dL

## 2020-03-12 NOTE — MAU Note (Signed)
Pt reports contractions that started around 1930. Reports maybe every 7-10 mins. Reports that she has not felt baby move since this afternoon. Pt denies LOF or vaginal bleeding. Cervix was 1cm 2 days ago.

## 2020-03-12 NOTE — Progress Notes (Signed)
Not correct pt laying on cuff switched cuff to other arm

## 2020-03-12 NOTE — MAU Provider Note (Signed)
History     CSN: [id number]  Arrival date and time: 03/12/20 1224   First Provider Initiated Contact with Patient 03/12/20 2318      Chief Complaint  Patient presents with  . Contractions   [name] is a 34 y.o. year old [phone] female at 68w4dweeks gestation who presents to MAU reporting contractions every 7-10 minutes and have not felt baby move in 2 days. She was seen in MAU 2 days ago for the same complaints. She had normal PEC labs then. She is scheduled to have IOL on Thursday 03/15/2020. She receives PNC at [facility].    OB History    Gravida  6   Para  4   Term  3   Preterm  1   AB  1   Living  4     SAB  1   TAB      Ectopic      Multiple  0   Live Births  4           Past Medical History:  Diagnosis Date  . Anxiety   . Asthma    inhaler PRN-hasn't used inhaler in months  . Attention deficit hyperactivity disorder   . Bipolar disorder ([facility]    hospitalized as a teen for suicidal ideations and cutting  . Depression    no complaints, doing well  . Gastritis   . GERD (gastroesophageal reflux disease)   . HPV (human papilloma virus) anogenital infection   . Hx of varicella   . Mild preeclampsia 02/09/2016  . Pregnancy induced hypertension   . Vaginal Pap smear, abnormal     Past Surgical History:  Procedure Laterality Date  . EYE MUSCLE SURGERY Bilateral    07/29/12  . MOUTH SURGERY    . PLANTAR FASCIA SURGERY    . WISDOM TOOTH EXTRACTION      Family History  Problem Relation Age of Onset  . Depression Mother   . Colon cancer Mother   . Colon polyps Mother   . Depression Father   . Lung cancer Father   . Depression Brother   . Liver disease Other   . Kidney disease Other     Social History   Tobacco Use  . Smoking status: Never Smoker  . Smokeless tobacco: Never Used  Substance Use Topics  . Alcohol use: No    Alcohol/week: 0.0 standard drinks  . Drug use: No    Allergies:  Allergies  Allergen  Reactions  . Depakote [Divalproex Sodium] Other (See Comments)    Pt states that this medication makes her blood levels toxic.    .Marland KitchenMethylphenidate Derivatives Other (See Comments)    Reaction:  Depression and anger   . Neurontin [Gabapentin] Other (See Comments)    Reaction:  Dizziness   . Prozac [Fluoxetine Hcl] Other (See Comments)    Reaction:  Anger   . Methylphenidate Hcl     SAME AS PREVIOUS LISTED METHYLPHENIDATE    Medications Prior to Admission  Medication Sig Dispense Refill Last Dose  . Prenatal Vit w/Fe-Methylfol-FA (PNV PO) Take by mouth.   03/11/2020 at Unknown time  . pseudoephedrine-acetaminophen (TYLENOL SINUS) 30-500 MG TABS tablet Take 1 tablet by mouth every 4 (four) hours as needed.   03/12/2020 at Unknown time  . QUEtiapine (SEROQUEL) 25 MG tablet Take 25 mg by mouth at bedtime.   03/11/2020 at Unknown time  . Sertraline HCl (ZOLOFT PO) Take 10 mg by mouth at  bedtime.   03/11/2020 at Unknown time  . Blood Pressure Monitoring (BLOOD PRESSURE KIT) DEVI 1 Device by Does not apply route as needed. ICD 10 O09.90 1 Device 0   . Elastic Bandages & Supports (COMFORT FIT MATERNITY SUPP LG) MISC 1 Units by Does not apply route daily as needed. (Patient not taking: Reported on 03/07/2020) 1 each 0   . famotidine (PEPCID) 20 MG tablet Take 1 tablet (20 mg total) by mouth 2 (two) times daily. 60 tablet 1     Review of Systems  Constitutional: Negative.   HENT: Negative.   Eyes: Negative.   Respiratory: Negative.   Cardiovascular: Negative.   Gastrointestinal: Negative.   Endocrine: Negative.   Genitourinary: Positive for pelvic pain (contractions).  Musculoskeletal: Negative.   Skin: Negative.   Allergic/Immunologic: Negative.   Neurological: Negative.   Hematological: Negative.   Psychiatric/Behavioral: Negative.    Physical Exam   Patient Vitals for the past 24 hrs:  BP Temp Temp src Pulse Resp SpO2 Height Weight  03/13/20 0016 (!) 115/56 -- -- 62 -- -- -- --   03/13/20 0001 102/63 -- -- 68 -- -- -- --  03/12/20 2346 110/65 -- -- 70 -- -- -- --  03/12/20 2331 111/65 -- -- (!) 57 -- -- -- --  03/12/20 2316 113/69 -- -- 75 -- -- -- --  03/12/20 2312 121/69 -- -- 89 -- -- -- --  03/12/20 2305 (!) 144/127 -- -- -- -- 97 % -- --  03/12/20 2246 131/85 -- -- 78 -- -- -- --  03/12/20 2231 (!) 149/96 -- -- 86 -- -- -- --  03/12/20 2216 (!) 141/86 -- -- 79 -- -- -- --  03/12/20 2201 138/85 -- -- 85 -- -- -- --  03/12/20 2146 133/72 -- -- 83 -- -- -- --  03/12/20 2137 131/84 -- -- 87 -- -- -- --  03/12/20 2111 131/78 98.1 F (36.7 C) Oral 87 20 99 % _0  (1.626 m) 114.6 kg    Physical Exam  Nursing note and vitals reviewed. Constitutional: She is oriented to person, place, and time. She appears well-developed and well-nourished.  HENT:  Head: Normocephalic and atraumatic.  Eyes: Pupils are equal, round, and reactive to light.  Cardiovascular: Normal rate.  Respiratory: Effort normal.  GI: Soft.  Genitourinary:    Genitourinary Comments: Dilation: 1.5 Effacement (%): Thick Cervical Position: Posterior Presentation: Undeterminable Exam by: Glena Norfolk, RN    Musculoskeletal:        General: Normal range of motion.     Cervical back: Normal range of motion.  Neurological: She is alert and oriented to person, place, and time.  Skin: Skin is warm and dry.  Psychiatric: She has a normal mood and affect. Her behavior is normal. Judgment and thought content normal.   NST - FHR: 130 bpm / moderate variability / accels present / decels absent / TOCO: none MAU Course  Procedures  MDM CCUA CBC CMP P/C Ratio Serial BP's   Results for orders placed or performed during the hospital encounter of 03/12/20 (from the past 24 hour(s))  Comprehensive metabolic panel     Status: Abnormal   Collection Time: 03/12/20 10:53 PM  Result Value Ref Range   Sodium 137 135 - 145 mmol/L   Potassium 5.2 (H) 3.5 - 5.1 mmol/L   Chloride 107 98 - 111 mmol/L    CO2 24 22 - 32 mmol/L   Glucose, Bld 94 70 - 99 mg/dL  BUN 7 6 - 20 mg/dL   Creatinine, Ser 0.78 0.44 - 1.00 mg/dL   Calcium 8.6 (L) 8.9 - 10.3 mg/dL   Total Protein 5.5 (L) 6.5 - 8.1 g/dL   Albumin 2.6 (L) 3.5 - 5.0 g/dL   AST 13 (L) 15 - 41 U/L   ALT 10 0 - 44 U/L   Alkaline Phosphatase 126 38 - 126 U/L   Total Bilirubin 0.5 0.3 - 1.2 mg/dL   GFR calc non Af Amer >60 >60 mL/min   GFR calc Af Amer >60 >60 mL/min   Anion gap 6 5 - 15  CBC     Status: Abnormal   Collection Time: 03/12/20 10:53 PM  Result Value Ref Range   WBC 7.1 4.0 - 10.5 K/uL   RBC 3.87 3.87 - 5.11 MIL/uL   Hemoglobin 11.5 (L) 12.0 - 15.0 g/dL   HCT 35.5 (L) 36.0 - 46.0 %   MCV 91.7 80.0 - 100.0 fL   MCH 29.7 26.0 - 34.0 pg   MCHC 32.4 30.0 - 36.0 g/dL   RDW 12.8 11.5 - 15.5 %   Platelets 171 150 - 400 K/uL   nRBC 0.4 (H) 0.0 - 0.2 %  Protein / creatinine ratio, urine     Status: Abnormal   Collection Time: 03/12/20 10:53 PM  Result Value Ref Range   Creatinine, Urine 348.34 mg/dL   Total Protein, Urine 65 mg/dL   Protein Creatinine Ratio 0.19 (H) 0.00 - 0.15 mg/mg[Cre]      Assessment and Plan  False labor  - Information provided on [place] ctxs  Gestational hypertension, third trimester - IOL scheduled for Thursday 03/15/2020 @ midnight  - Discharge patient - Keep scheduled appts for fetal testing and COVID-19 testing - Patient verbalized an understanding of the plan of care and agrees.  Laury Deep, CNM 03/12/2020, 11:18 PM

## 2020-03-13 DIAGNOSIS — Z3A36 36 weeks gestation of pregnancy: Secondary | ICD-10-CM

## 2020-03-13 DIAGNOSIS — O479 False labor, unspecified: Secondary | ICD-10-CM | POA: Diagnosis present

## 2020-03-13 DIAGNOSIS — O4703 False labor before 37 completed weeks of gestation, third trimester: Secondary | ICD-10-CM

## 2020-03-14 ENCOUNTER — Other Ambulatory Visit (HOSPITAL_COMMUNITY)
Admission: RE | Admit: 2020-03-14 | Discharge: 2020-03-14 | Disposition: A | Discharge status: Home / Self Care | Payer: Medicare Other | Source: Ambulatory Visit | Attending: Family Medicine | Admitting: Family Medicine

## 2020-03-14 ENCOUNTER — Ambulatory Visit (INDEPENDENT_AMBULATORY_CARE_PROVIDER_SITE_OTHER): Payer: Medicare Other | Admitting: Family Medicine

## 2020-03-14 ENCOUNTER — Other Ambulatory Visit: Payer: Self-pay

## 2020-03-14 VITALS — BP 124/77 | HR 100 | Wt 251.8 lb

## 2020-03-14 DIAGNOSIS — O133 Gestational [pregnancy-induced] hypertension without significant proteinuria, third trimester: Secondary | ICD-10-CM

## 2020-03-14 DIAGNOSIS — Z3A36 36 weeks gestation of pregnancy: Secondary | ICD-10-CM

## 2020-03-14 LAB — SARS CORONAVIRUS 2 (TAT 6-24 HRS): SARS Coronavirus 2: NEGATIVE

## 2020-03-14 NOTE — Progress Notes (Signed)
   PRENATAL VISIT NOTE  Subjective:  [name] is a 34 y.o. [id number] at [redacted]w[redacted]d being seen today for ongoing prenatal care.  She is currently monitored for the following issues for this high-risk pregnancy and has Borderline behavior; GERD (gastroesophageal reflux disease); Bipolar affective disorder, depressed, moderate degree ([place]); Obesity (BMI 30-39.9); OSA (obstructive sleep apnea); Vaginal venereal warts; Anxiety state; Asthma; Consecutive esotropia; Intellectual disability; ADHD (attention deficit hyperactivity disorder), inattentive type; Hx of preeclampsia, prior pregnancy, currently pregnant; History of preterm delivery after IOL for preeclampsia, currently pregnant; History of gestational diabetes in prior pregnancy, currently pregnant; Short interval between pregnancies affecting pregnancy, antepartum; Supervision of high risk pregnancy, antepartum; Nausea and vomiting during pregnancy; Gestational hypertension, third trimester; and False labor on their problem list.  Patient reports backache.   .  .  Movement: Present. Denies leaking of fluid.   The following portions of the patient's history were reviewed and updated as appropriate: allergies, current medications, past family history, past medical history, past social history, past surgical history and problem list.   Objective:   Vitals:   03/14/20 1608  BP: 124/77  Pulse: 100  Weight: 251 lb 12.8 oz (114.2 kg)    Fetal Status: Fetal Heart Rate (bpm): NST   Movement: Present     General:  Alert, oriented and cooperative. Patient is in no acute distress.  Skin: Skin is warm and dry. No rash noted.   Cardiovascular: Normal heart rate noted  Respiratory: Normal respiratory effort, no problems with respiration noted  Abdomen: Soft, gravid, appropriate for gestational age.        Pelvic: Cervical exam performed in the presence of a chaperone Dilation: 1.5 Effacement (%): Thick Station: Ballotable  Extremities: Normal range  of motion.     Mental Status: Normal mood and affect. Normal behavior. Normal judgment and thought content.  NST:  Baseline: 145 bpm, Variability: Good {> 6 bpm), Accelerations: Reactive and Decelerations: Absent   Assessment and Plan:  Pregnancy: [id number] at [redacted]w[redacted]d 1. Gestational hypertension, third trimester BPis stable, IOL scheduled. - Fetal nonstress test  Preterm labor symptoms and general obstetric precautions including but not limited to vaginal bleeding, contractions, leaking of fluid and fetal movement were reviewed in detail with the patient. Please refer to After Visit Summary for other counseling recommendations.   No follow-ups on file.  Future Appointments  Date Time Provider [place]  03/16/2020 12:00 AM [facility] SCHED ROOM [facility] None    Donnamae Jude, MD

## 2020-03-14 NOTE — Progress Notes (Signed)
Pt reports decreased fetal movement x2 days. She also states that she is in a lot of pain and would like to see the doctor today. Pt had MAU visit on 03/12/20 for labor check.

## 2020-03-15 ENCOUNTER — Other Ambulatory Visit: Payer: Self-pay

## 2020-03-15 ENCOUNTER — Inpatient Hospital Stay (HOSPITAL_COMMUNITY)
Admission: AD | Admit: 2020-03-15 | Discharge: 2020-03-15 | Disposition: A | Discharge status: Home / Self Care | Payer: Medicare Other | Source: Home / Self Care | Attending: Obstetrics & Gynecology | Admitting: Obstetrics & Gynecology

## 2020-03-15 DIAGNOSIS — O479 False labor, unspecified: Secondary | ICD-10-CM

## 2020-03-15 DIAGNOSIS — Z3A36 36 weeks gestation of pregnancy: Secondary | ICD-10-CM

## 2020-03-15 NOTE — MAU Note (Signed)
I have communicated with Colman Cater and reviewed vital signs:  Vitals:   03/15/20 1611 03/15/20 1624  BP:  (!) 123/93  Pulse:  (!) 109  Resp:    Temp:    SpO2: 98%     Vaginal exam:  Dilation: 1.5 Effacement (%): 10 Cervical Position: Middle Station: Ballotable Presentation: Vertex Exam by:: T Syble Picco RN ,   Also reviewed contraction pattern and that non-stress test is reactive.  It has been documented that patient is having irregular contractions and her cervix is unchanged from exam yesterday.Patient denies any other complaints.  Based on this report provider has given order for discharge.  A discharge order and diagnosis entered by a provider.   Labor discharge instructions reviewed with patient.

## 2020-03-15 NOTE — MAU Note (Addendum)
"  is hurting and in pain", contractions- started about 20-28min ago. No bleeding or leaking.  Thinks she was 1+ yesterday in office.  Is scheduled to come in at midnight tonight for induction. Denies HA, visual changes, epigastric pain or increase in swelling.

## 2020-03-15 NOTE — Discharge Instructions (Signed)
Braxton Hicks Contractions °Contractions of the uterus can occur throughout pregnancy, but they are not always a sign that you are in labor. You may have practice contractions called Braxton Hicks contractions. These false labor contractions are sometimes confused with true labor. °What are Braxton Hicks contractions? °Braxton Hicks contractions are tightening movements that occur in the muscles of the uterus before labor. Unlike true labor contractions, these contractions do not result in opening (dilation) and thinning of the cervix. Toward the end of pregnancy (32-34 weeks), Braxton Hicks contractions can happen more often and may become stronger. These contractions are sometimes difficult to tell apart from true labor because they can be very uncomfortable. You should not feel embarrassed if you go to the hospital with false labor. °Sometimes, the only way to tell if you are in true labor is for your health care provider to look for changes in the cervix. The health care provider will do a physical exam and may monitor your contractions. If you are not in true labor, the exam should show that your cervix is not dilating and your water has not broken. °If there are no other health problems associated with your pregnancy, it is completely safe for you to be sent home with false labor. You may continue to have Braxton Hicks contractions until you go into true labor. °How to tell the difference between true labor and false labor °True labor °· Contractions last 30-70 seconds. °· Contractions become very regular. °· Discomfort is usually felt in the top of the uterus, and it spreads to the lower abdomen and low back. °· Contractions do not go away with walking. °· Contractions usually become more intense and increase in frequency. °· The cervix dilates and gets thinner. °False labor °· Contractions are usually shorter and not as strong as true labor contractions. °· Contractions are usually irregular. °· Contractions  are often felt in the front of the lower abdomen and in the groin. °· Contractions may go away when you walk around or change positions while lying down. °· Contractions get weaker and are shorter-lasting as time goes on. °· The cervix usually does not dilate or become thin. °Follow these instructions at home: ° °· Take over-the-counter and prescription medicines only as told by your health care provider. °· Keep up with your usual exercises and follow other instructions from your health care provider. °· Eat and drink lightly if you think you are going into labor. °· If Braxton Hicks contractions are making you uncomfortable: °? Change your position from lying down or resting to walking, or change from walking to resting. °? Sit and rest in a tub of warm water. °? Drink enough fluid to keep your urine pale yellow. Dehydration may cause these contractions. °? Do slow and deep breathing several times an hour. °· Keep all follow-up prenatal visits as told by your health care provider. This is important. °Contact a health care provider if: °· You have a fever. °· You have continuous pain in your abdomen. °Get help right away if: °· Your contractions become stronger, more regular, and closer together. °· You have fluid leaking or gushing from your vagina. °· You pass blood-tinged mucus (bloody show). °· You have bleeding from your vagina. °· You have low back pain that you never had before. °· You feel your baby’s head pushing down and causing pelvic pressure. °· Your baby is not moving inside you as much as it used to. °Summary °· Contractions that occur before labor are   called Braxton Hicks contractions, false labor, or practice contractions. °· Braxton Hicks contractions are usually shorter, weaker, farther apart, and less regular than true labor contractions. True labor contractions usually become progressively stronger and regular, and they become more frequent. °· Manage discomfort from Braxton Hicks contractions  by changing position, resting in a warm bath, drinking plenty of water, or practicing deep breathing. °This information is not intended to replace advice given to you by your health care provider. Make sure you discuss any questions you have with your health care provider. °Document Revised: 11/13/2017 Document Reviewed: 04/16/2017 °Elsevier Patient Education © 2020 [employer] ° °

## 2020-03-16 ENCOUNTER — Inpatient Hospital Stay (HOSPITAL_COMMUNITY): Payer: Medicare Other | Admitting: Anesthesiology

## 2020-03-16 ENCOUNTER — Encounter (HOSPITAL_COMMUNITY): Payer: Self-pay | Admitting: Family Medicine

## 2020-03-16 ENCOUNTER — Inpatient Hospital Stay (HOSPITAL_COMMUNITY): Payer: Medicare Other

## 2020-03-16 ENCOUNTER — Encounter (HOSPITAL_COMMUNITY): Payer: Medicare Other

## 2020-03-16 ENCOUNTER — Inpatient Hospital Stay: Admission: AD | Admit: 2020-03-16 | Payer: Medicare Other | Source: Home / Self Care | Admitting: Family Medicine

## 2020-03-16 ENCOUNTER — Other Ambulatory Visit: Payer: Self-pay

## 2020-03-16 ENCOUNTER — Inpatient Hospital Stay (HOSPITAL_COMMUNITY)
Admission: AD | Admit: 2020-03-16 | Discharge: 2020-03-18 | DRG: 807 | Disposition: A | Discharge status: Home / Self Care | Payer: Medicare Other | Attending: Obstetrics & Gynecology | Admitting: Obstetrics & Gynecology

## 2020-03-16 DIAGNOSIS — Z20822 Contact with and (suspected) exposure to covid-19: Secondary | ICD-10-CM | POA: Diagnosis present

## 2020-03-16 DIAGNOSIS — O134 Gestational [pregnancy-induced] hypertension without significant proteinuria, complicating childbirth: Secondary | ICD-10-CM | POA: Diagnosis present

## 2020-03-16 DIAGNOSIS — Z30017 Encounter for initial prescription of implantable subdermal contraceptive: Secondary | ICD-10-CM | POA: Diagnosis not present

## 2020-03-16 DIAGNOSIS — Z3A37 37 weeks gestation of pregnancy: Secondary | ICD-10-CM | POA: Diagnosis not present

## 2020-03-16 DIAGNOSIS — F319 Bipolar disorder, unspecified: Secondary | ICD-10-CM | POA: Diagnosis present

## 2020-03-16 DIAGNOSIS — J45909 Unspecified asthma, uncomplicated: Secondary | ICD-10-CM | POA: Diagnosis not present

## 2020-03-16 DIAGNOSIS — F909 Attention-deficit hyperactivity disorder, unspecified type: Secondary | ICD-10-CM | POA: Diagnosis present

## 2020-03-16 DIAGNOSIS — O99344 Other mental disorders complicating childbirth: Secondary | ICD-10-CM | POA: Diagnosis present

## 2020-03-16 DIAGNOSIS — O139 Gestational [pregnancy-induced] hypertension without significant proteinuria, unspecified trimester: Secondary | ICD-10-CM | POA: Diagnosis present

## 2020-03-16 DIAGNOSIS — O99214 Obesity complicating childbirth: Secondary | ICD-10-CM | POA: Diagnosis present

## 2020-03-16 LAB — CBC
HCT: 36.9 % (ref 36.0–46.0)
Hemoglobin: 11.8 g/dL — ABNORMAL LOW (ref 12.0–15.0)
MCH: 29.5 pg (ref 26.0–34.0)
MCHC: 32 g/dL (ref 30.0–36.0)
MCV: 92.3 fL (ref 80.0–100.0)
Platelets: 161 10*3/uL (ref 150–400)
RBC: 4 MIL/uL (ref 3.87–5.11)
RDW: 12.7 % (ref 11.5–15.5)
WBC: 9.4 10*3/uL (ref 4.0–10.5)
nRBC: 0.2 % (ref 0.0–0.2)

## 2020-03-16 LAB — COMPREHENSIVE METABOLIC PANEL
ALT: 9 U/L (ref 0–44)
AST: 14 U/L — ABNORMAL LOW (ref 15–41)
Albumin: 2.5 g/dL — ABNORMAL LOW (ref 3.5–5.0)
Alkaline Phosphatase: 131 U/L — ABNORMAL HIGH (ref 38–126)
Anion gap: 9 (ref 5–15)
BUN: 9 mg/dL (ref 6–20)
CO2: 24 mmol/L (ref 22–32)
Calcium: 8.5 mg/dL — ABNORMAL LOW (ref 8.9–10.3)
Chloride: 103 mmol/L (ref 98–111)
Creatinine, Ser: 0.61 mg/dL (ref 0.44–1.00)
GFR calc Af Amer: >60 mL/min (ref >60)
GFR calc non Af Amer: >60 mL/min (ref >60)
Glucose, Bld: 90 mg/dL (ref 70–99)
Potassium: 4.5 mmol/L (ref 3.5–5.1)
Sodium: 136 mmol/L (ref 135–145)
Total Bilirubin: 0.4 mg/dL (ref 0.3–1.2)
Total Protein: 5 g/dL — ABNORMAL LOW (ref 6.5–8.1)

## 2020-03-16 LAB — TYPE AND SCREEN
ABO/RH(D): O POS
Antibody Screen: NEGATIVE

## 2020-03-16 LAB — PROTEIN / CREATININE RATIO, URINE
Creatinine, Urine: 95.84 mg/dL
Protein Creatinine Ratio: 0.15 mg/mg{Cre} (ref 0.00–0.15)
Total Protein, Urine: 14 mg/dL

## 2020-03-16 LAB — RPR: RPR Ser Ql: NONREACTIVE

## 2020-03-16 MED ORDER — OXYCODONE-ACETAMINOPHEN 5-325 MG PO TABS: 2.0000 | ORAL_TABLET | ORAL | Status: DC | PRN

## 2020-03-16 MED ORDER — PHENYLEPHRINE 40 MCG/ML (10ML) SYRINGE FOR IV PUSH (FOR BLOOD PRESSURE SUPPORT): 80.0000 ug | PREFILLED_SYRINGE | INTRAVENOUS | Status: DC | PRN

## 2020-03-16 MED ORDER — ACETAMINOPHEN 325 MG PO TABS: 650.0000 mg | ORAL_TABLET | ORAL | Status: DC | PRN

## 2020-03-16 MED ORDER — FENTANYL CITRATE (PF) 100 MCG/2ML IJ SOLN
50.0000 ug | INTRAMUSCULAR | Status: DC | PRN
  Administered 2020-03-16: 100 ug via INTRAVENOUS
  Filled 2020-03-16: qty 2

## 2020-03-16 MED ORDER — SODIUM CHLORIDE (PF) 0.9 % IJ SOLN
INTRAMUSCULAR | Status: DC | PRN
  Administered 2020-03-16: 12 mL/h via EPIDURAL

## 2020-03-16 MED ORDER — OXYTOCIN 40 UNITS IN NORMAL SALINE INFUSION - SIMPLE MED
1.0000 m[IU]/min | INTRAVENOUS | Status: DC
  Administered 2020-03-16: 2 m[IU]/min via INTRAVENOUS

## 2020-03-16 MED ORDER — LACTATED RINGERS IV SOLN: INTRAVENOUS | Status: DC

## 2020-03-16 MED ORDER — FENTANYL-BUPIVACAINE-NACL 0.5-0.125-0.9 MG/250ML-% EP SOLN: 12.0000 mL/h | EPIDURAL | Status: DC | PRN

## 2020-03-16 MED ORDER — OXYTOCIN BOLUS FROM INFUSION
500.0000 mL | Freq: Once | INTRAVENOUS | Status: AC
  Administered 2020-03-16: 500 mL via INTRAVENOUS

## 2020-03-16 MED ORDER — TERBUTALINE SULFATE 1 MG/ML IJ SOLN: 0.2500 mg | Freq: Once | INTRAMUSCULAR | Status: DC | PRN

## 2020-03-16 MED ORDER — OXYCODONE-ACETAMINOPHEN 5-325 MG PO TABS: 1.0000 | ORAL_TABLET | ORAL | Status: DC | PRN

## 2020-03-16 MED ORDER — LIDOCAINE-EPINEPHRINE (PF) 2 %-1:200000 IJ SOLN
INTRAMUSCULAR | Status: DC | PRN
  Administered 2020-03-16: 3 mL via EPIDURAL
  Administered 2020-03-16: 2 mL via EPIDURAL

## 2020-03-16 MED ORDER — ONDANSETRON HCL 4 MG/2ML IJ SOLN
4.0000 mg | Freq: Four times a day (QID) | INTRAMUSCULAR | Status: DC | PRN
  Administered 2020-03-16: 4 mg via INTRAVENOUS
  Filled 2020-03-16: qty 2

## 2020-03-16 MED ORDER — PANTOPRAZOLE SODIUM 40 MG IV SOLR
20.0000 mg | Freq: Once | INTRAVENOUS | Status: AC
  Administered 2020-03-16: 20 mg via INTRAVENOUS
  Filled 2020-03-16: qty 40

## 2020-03-16 MED ORDER — SOD CITRATE-CITRIC ACID 500-334 MG/5ML PO SOLN: 30.0000 mL | ORAL | Status: DC | PRN

## 2020-03-16 MED ORDER — EPHEDRINE 5 MG/ML INJ: 10.0000 mg | INTRAVENOUS | Status: DC | PRN

## 2020-03-16 MED ORDER — FENTANYL-BUPIVACAINE-NACL 0.5-0.125-0.9 MG/250ML-% EP SOLN
EPIDURAL | Status: AC
  Filled 2020-03-16: qty 250

## 2020-03-16 MED ORDER — LIDOCAINE HCL (PF) 1 % IJ SOLN: 30.0000 mL | INTRAMUSCULAR | Status: DC | PRN

## 2020-03-16 MED ORDER — DIPHENHYDRAMINE HCL 50 MG/ML IJ SOLN: 12.5000 mg | INTRAMUSCULAR | Status: DC | PRN

## 2020-03-16 MED ORDER — PROMETHAZINE HCL 25 MG/ML IJ SOLN
25.0000 mg | Freq: Once | INTRAMUSCULAR | Status: AC
  Administered 2020-03-16: 25 mg via INTRAVENOUS
  Filled 2020-03-16: qty 1

## 2020-03-16 MED ORDER — LACTATED RINGERS IV SOLN: 500.0000 mL | INTRAVENOUS | Status: DC | PRN

## 2020-03-16 MED ORDER — LACTATED RINGERS IV SOLN: 500.0000 mL | Freq: Once | INTRAVENOUS | Status: DC

## 2020-03-16 MED ORDER — OXYTOCIN 40 UNITS IN NORMAL SALINE INFUSION - SIMPLE MED
2.5000 [IU]/h | INTRAVENOUS | Status: DC
  Filled 2020-03-16: qty 1000

## 2020-03-16 NOTE — H&P (Signed)
[name] is a 34 y.o. female presenting for Induction of labor for Gestational Hypertension Has had several recent exams with no change in cervix Pregnancy followed at [facility]:   Patient Active Problem List   Diagnosis Date Noted  . Gestational hypertension 03/16/2020  . False labor 03/13/2020  . Gestational hypertension, third trimester 02/09/2020  . Nausea and vomiting during pregnancy 10/04/2019  . Supervision of high risk pregnancy, antepartum 09/12/2019  . Hx of preeclampsia, prior pregnancy, currently pregnant 10/13/2017  . History of preterm delivery after IOL for preeclampsia, currently pregnant 10/13/2017  . History of gestational diabetes in prior pregnancy, currently pregnant 10/13/2017  . Short interval between pregnancies affecting pregnancy, antepartum 10/13/2017  . 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 Anxiety state 03/27/2014  . OSA (obstructive sleep apnea) 09/15/2013  . Asthma 05/21/2013  . 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 Intellectual disability 09/15/2012  . Obesity (BMI 30-39.9) 08/03/2012  . Bipolar affective disorder, depressed, moderate degree ([place]) 06/02/2012    Class: Acute  . GERD (gastroesophageal reflux disease) 05/19/2012  . Borderline behavior 04/12/2012    Class: Acute   . OB History    Gravida  6   Para  4   Term  3   Preterm  1   AB  1   Living  4     SAB  1   TAB      Ectopic      Multiple  0   Live Births  4          Past Medical History:  Diagnosis Date  . Anxiety   . Asthma    inhaler PRN-hasn't used inhaler in months  . Attention deficit hyperactivity disorder   . Bipolar disorder [facility])    hospitalized as a teen for suicidal ideations and cutting  . Depression    no complaints, doing well  . Gastritis   . GERD (gastroesophageal reflux disease)   . HPV (human papilloma virus) anogenital infection   . Hx of varicella   .  Mild preeclampsia 02/09/2016  . Pregnancy induced hypertension   . Vaginal Pap smear, abnormal    Past Surgical History:  Procedure Laterality Date  . EYE MUSCLE SURGERY Bilateral    07/29/12  . MOUTH SURGERY    . PLANTAR FASCIA SURGERY    . WISDOM TOOTH EXTRACTION     Family History: family history includes Colon cancer in her mother; Colon polyps in her mother; Depression in her brother, father, and mother; Kidney disease in an other family member; Liver disease in an other family member; Lung cancer in her father. Social History:  reports that she has never smoked. She has never used smokeless tobacco. She reports that she does not drink alcohol or use drugs.     Maternal Diabetes: No Genetic Screening: Normal Maternal Ultrasounds/Referrals: Normal Fetal Ultrasounds or other Referrals:  None Maternal Substance Abuse:  No Significant Maternal Medications:  Meds include: Other: Seroquel, Zoloft Significant Maternal Lab Results:  Group B Strep negative Other Comments:  Will get SW consult PP  Review of Systems  Constitutional: Negative for chills and fever.  Respiratory: Negative for shortness of breath.   Gastrointestinal: Negative for abdominal pain, constipation, diarrhea and nausea.  Genitourinary: Negative for pelvic pain and vaginal bleeding.  Musculoskeletal: Negative for back pain.  Neurological: Negative for weakness.  Psychiatric/Behavioral: Positive for behavioral problems.   Maternal Medical History:  Reason for admission: Nausea.  IOL for Gestational Hypertension  Contractions: Frequency: irregular.   Perceived severity is mild.    Fetal activity: Perceived fetal activity is normal.   Last perceived fetal movement was within the past hour.    Prenatal complications: PIH.   No bleeding, IUGR, placental abnormality, pre-eclampsia or preterm labor.   Prenatal Complications - Diabetes: none.    Dilation: 2 Effacement (%): 50 Station: -3 Exam by:: Carmelia Roller  CNM Blood pressure 136/70, pulse 76, temperature 98.5 F (36.9 C), resp. rate 16, height 5\' 4"  (1.626 m), weight 115.9 kg, last menstrual period 06/25/2019, unknown if currently breastfeeding. Maternal Exam:  Uterine Assessment: Contraction strength is mild.  Contraction frequency is irregular.   Abdomen: Patient reports no abdominal tenderness. Fetal presentation: vertex  Introitus: Normal vulva. Normal vagina.  Ferning test: not done.  Nitrazine test: not done. Amniotic fluid character: not assessed.  Pelvis: adequate for delivery.   Cervix: Cervix evaluated by digital exam.     Fetal Exam Fetal Monitor Review: Mode: ultrasound.   Baseline rate: 145.  Variability: moderate (6-25 bpm).   Pattern: accelerations present and no decelerations.    Fetal State Assessment: Category I - tracings are normal.     Physical Exam  Constitutional: She is oriented to person, place, and time. She appears well-developed and well-nourished.  HENT:  Head: Normocephalic.  Cardiovascular: Normal rate.  Respiratory: Effort normal. No respiratory distress.  GI: Soft. She exhibits no distension. There is no abdominal tenderness. There is no rebound and no guarding.  Genitourinary:    Vulva normal.     Genitourinary Comments: Dilation: 2 Effacement (%): 50, soft Station: -3 Exam by:: Carmelia Roller CNM    Musculoskeletal:        General: Normal range of motion.  Neurological: She is alert and oriented to person, place, and time.  Skin: Skin is warm and dry.  Psychiatric: She has a normal mood and affect.    Prenatal labs: ABO, Rh: --/--/O POS (04/02 0107) Antibody: NEG (04/02 0107) Rubella: 3.27 (11/09 1813) RPR: Non Reactive (02/25 1649)  HBsAg: Negative (11/09 1813)  HIV: Non Reactive (02/25 1649)  GBS: Negative/-- (03/24 1528)   Assessment/Plan: [place] IUP at [redacted]w[redacted]d Gestational Hypertension without signs of Preeclampsia Moderately favorable cervix with history of rapid labor  Admit  to Labor and Delivery Routine orders Pitocin induction Epidural PRN Anticipate SVD  Hansel Feinstein 03/16/2020, 2:09 AM

## 2020-03-16 NOTE — Discharge Summary (Addendum)
Postpartum Discharge Summary     Patient Name: [name] DOB: January 04, 1986 MRN: [id number]  Date of admission: 03/16/2020 Delivering Provider: Lajean Manes   Date of discharge: 03/18/2020  Admitting diagnosis: Gestational hypertension [O13.9] Intrauterine pregnancy: [redacted]w[redacted]d    Secondary diagnosis:  Active Problems:   Gestational hypertension   SVD (spontaneous vaginal delivery)   Nexplanon insertion  Additional problems: bipolar affective disorder      Discharge diagnosis: Term Pregnancy Delivered and Gestational Hypertension                                                                                                Post partum procedures:Nexplanon insertion   Augmentation: AROM and Pitocin  Complications: None  Hospital course:  Induction of Labor With Vaginal Delivery   34y.o. yo [id number] admitted to the hospital 03/16/2020 for induction of labor.  Indication for induction: Gestational hypertension.  Patient had an uncomplicated labor course as follows: Membrane Rupture Time/Date: 10:30 AM ,03/16/2020   Intrapartum Procedures: Episiotomy: None [1]                                         Lacerations:  None [1]  Patient had delivery of a Viable infant.  Information for the patient's newborn:  HoKalista, Laguardiairl BoMalissie0[[id number]]Delivery Method: Vaginal, Spontaneous(Filed from Delivery Summary)    03/16/2020  Details of delivery can be found in separate delivery note.  Patient had a routine postpartum course. Patient is discharged home 03/18/20. Delivery time: 9:47 PM    Magnesium Sulfate received: No BMZ received: No Rhophylac:N/A MMR:N/A Transfusion:No  Physical exam  Vitals:   03/17/20 1250 03/17/20 1750 03/17/20 2228 03/18/20 0722  BP: 134/79 118/73 (!) 142/93 124/71  Pulse: 60 (!) 55 69 60  Resp: 18 18 18    Temp: 98.8 F (37.1 C) 98.2 F (36.8 C) 97.9 F (36.6 C) 98.2 F (36.8 C)  TempSrc:  Oral Oral Oral  SpO2: 99%  100%   Weight:       Height:       General: alert, cooperative and no distress Lochia: appropriate Uterine Fundus: firm Incision: N/A DVT Evaluation: No evidence of DVT seen on physical exam. No significant calf/ankle edema. Labs: Lab Results  Component Value Date   WBC 9.4 03/16/2020   HGB 11.8 (L) 03/16/2020   HCT 36.9 03/16/2020   MCV 92.3 03/16/2020   PLT 161 03/16/2020   CMP Latest Ref Rng & Units 03/16/2020  Glucose 70 - 99 mg/dL 90  BUN 6 - 20 mg/dL 9  Creatinine 0.44 - 1.00 mg/dL 0.61  Sodium 135 - 145 mmol/L 136  Potassium 3.5 - 5.1 mmol/L 4.5  Chloride 98 - 111 mmol/L 103  CO2 22 - 32 mmol/L 24  Calcium 8.9 - 10.3 mg/dL 8.5(L)  Total Protein 6.5 - 8.1 g/dL 5.0(L)  Total Bilirubin 0.3 - 1.2 mg/dL 0.4  Alkaline Phos 38 - 126 U/L 131(H)  AST 15 - 41 U/L 14(L)  ALT 0 - 44 U/L 9   Edinburgh Score: Edinburgh Postnatal Depression Scale Screening Tool 03/17/2020  I have been able to laugh and see the funny side of things. 0  I have looked forward with enjoyment to things. 0  I have blamed myself unnecessarily when things went wrong. 0  I have been anxious or worried for no good reason. 0  I have felt scared or panicky for no good reason. 0  Things have been getting on top of me. 0  I have been so unhappy that I have had difficulty sleeping. 0  I have felt sad or miserable. 0  I have been so unhappy that I have been crying. 0  The thought of harming myself has occurred to me. 0  Edinburgh Postnatal Depression Scale Total 0    Discharge instruction: per After Visit Summary and "Baby and Me Booklet".  After visit meds:  Allergies as of 03/18/2020      Reactions   Depakote [divalproex Sodium] Other (See Comments)   Pt states that this medication makes her blood levels toxic.     Methylphenidate Derivatives Other (See Comments)   Reaction:  Depression and anger    Neurontin [gabapentin] Other (See Comments)   Reaction:  Dizziness    Prozac [fluoxetine Hcl] Other (See Comments)    Reaction:  Anger    Methylphenidate Hcl    SAME AS PREVIOUS LISTED METHYLPHENIDATE      Medication List    TAKE these medications   aspirin 325 MG tablet Take 325 mg by mouth daily. Gestational hypertension   Blood Pressure Kit Devi 1 Device by Does not apply route as needed. ICD 10 O09.90   Comfort Fit Maternity Supp Lg Misc 1 Units by Does not apply route daily as needed.   famotidine 20 MG tablet Commonly known as: Pepcid Take 1 tablet (20 mg total) by mouth 2 (two) times daily.   ibuprofen 600 MG tablet Commonly known as: ADVIL Take 1 tablet (600 mg total) by mouth every 6 (six) hours.   PNV PO Take by mouth.   QUEtiapine 25 MG tablet Commonly known as: SEROQUEL Take 25 mg by mouth at bedtime.   ZOLOFT PO Take 10 mg by mouth at bedtime.       Diet: routine diet  Activity: Advance as tolerated. Pelvic rest for 6 weeks.   Outpatient follow up:1 week BP check and 4 week PP appointment  Follow up Appt:No future appointments. Follow up Visit: [place] [facility]. Schedule an appointment as soon as possible for a visit in 4 week(s).   Specialty: Obstetrics and Gynecology Contact information: [address] [place], [place] [id number] [place] [address] [phone]          Please schedule this patient for Postpartum visit in: 4 weeks with the following provider: Any provider In-Person For C/S patients schedule nurse incision check in weeks 2 weeks: no High risk pregnancy complicated by: HTN Delivery mode:  SVD Anticipated Birth Control:  PP Nexplanon placed PP Procedures needed: BP check  Schedule Integrated [place] visit: yes   Newborn Data: Live born female  Birth Weight:  3235g APGAR: 9, 9  Newborn Delivery   Birth date/time: 03/16/2020 21:47:00 Delivery type: Vaginal, Spontaneous      Baby Feeding: Bottle Disposition:rooming in- baby unable to be discharge d/t SW  and CPS involvement    03/18/2020 Lajean Manes, CNM

## 2020-03-16 NOTE — Progress Notes (Addendum)
Labor Progress Note [name] is a 34 y.o. [id number] at [redacted]w[redacted]d presented for IOL for gHTN. S: Feels dizzy s/p phenergan.    O:  BP 109/80   Pulse 72   Temp 98.6 F (37 C) (Oral)   Resp 20   Ht 5\' 4"  (1.626 m)   Wt 115.9 kg   LMP 06/25/2019   BMI 43.85 kg/m  EFM: 130s / moderate variability, no accels, no decels TOCO: every 2-3 minutes   CVE: Dilation: 2 Effacement (%): 50 Cervical Position: Middle Station: -3 Presentation: Vertex Exam by:: Wess Botts RNC   A&P: 34 y.o. [id number] [redacted]w[redacted]d here for IOL for gHTN. #Labor: Continue pitocin and s/p AROM. RN to assist with position changes.  No cervical changes. Anticipate SVD.  #Pain: Epidural  #FWB: Category 1  #GBS negative #gHTN: BP 111/75 (normal pressures), Protein / Cr ratio pending.     Lyndee Hensen, DO 2:59 PM  Addendum: IUPC placed posteriorly due to lack of cervical change. Anticipate SVD.   Barrington Ellison, MD [facility] Family Medicine Fellow, [facility] for [employer], [place]

## 2020-03-16 NOTE — Anesthesia Preprocedure Evaluation (Signed)
Anesthesia Evaluation  Patient identified by MRN, date of birth, ID band Patient awake    Reviewed: Allergy & Precautions, NPO status , Patient's Chart, lab work & pertinent test results  Airway Mallampati: II  TM Distance: >3 FB Neck ROM: Full    Dental no notable dental hx.    Pulmonary asthma , sleep apnea ,    Pulmonary exam normal breath sounds clear to auscultation       Cardiovascular hypertension (gHTN), Normal cardiovascular exam Rhythm:Regular Rate:Normal     Neuro/Psych PSYCHIATRIC DISORDERS Anxiety Depression Bipolar Disorder negative neurological ROS     GI/Hepatic Neg liver ROS, GERD  Medicated and Controlled,  Endo/Other  Morbid obesity (BMI 44)  Renal/GU negative Renal ROS  negative genitourinary   Musculoskeletal negative musculoskeletal ROS (+)   Abdominal   Peds  (+) ADHD Hematology negative hematology ROS (+)   Anesthesia Other Findings IOL for gHTN  Reproductive/Obstetrics (+) Pregnancy                             Anesthesia Physical Anesthesia Plan  ASA: III  Anesthesia Plan: Epidural   Post-op Pain Management:    Induction:   PONV Risk Score and Plan: Treatment may vary due to age or medical condition  Airway Management Planned: Natural Airway  Additional Equipment:   Intra-op Plan:   Post-operative Plan:   Informed Consent: I have reviewed the patients History and Physical, chart, labs and discussed the procedure including the risks, benefits and alternatives for the proposed anesthesia with the patient or authorized representative who has indicated his/her understanding and acceptance.       Plan Discussed with: Anesthesiologist  Anesthesia Plan Comments: (Patient identified. Risks, benefits, options discussed with patient including but not limited to bleeding, infection, nerve damage, paralysis, failed block, incomplete pain control, headache,  blood pressure changes, nausea, vomiting, reactions to medication, itching, and post partum back pain. Confirmed with bedside nurse the patient's most recent platelet count. Confirmed with the patient that they are not taking any anticoagulation, have any bleeding history or any family history of bleeding disorders. Patient expressed understanding and wishes to proceed. All questions were answered. )        Anesthesia Quick Evaluation

## 2020-03-16 NOTE — Progress Notes (Signed)
Labor Progress Note [name] is a 34 y.o. [id number] at [redacted]w[redacted]d presented for IOL for gHTN. S: Comfortable with epidural. Would like AROM.  O:  BP (!) 94/48   Pulse (!) 58   Temp 98.5 F (36.9 C) (Oral)   Resp 15   Ht 5\' 4"  (1.626 m)   Wt 115.9 kg   LMP 06/25/2019   BMI 43.85 kg/m  EFM: 145, moderate variability, pos accels, no decels, reactive TOCO: none currently   CVE: Dilation: 2 Effacement (%): 50 Station: -3 Exam by:: [name] CNM   A&P: 34 y.o. [id number] [redacted]w[redacted]d here for IOL for gHTN. #Labor: Vertex by exam. Pit started overnight after epidural placed. Now on 18 with no cervical change. Head well-applied and multip. AROM with large amount of clear fluid. Anticipate SVD. #Pain: epidural #FWB: Cat I #GBS negative #gHTN: normal range pressures currently; Pr/Cr pending  Chauncey Mann, MD 10:37 AM

## 2020-03-16 NOTE — Progress Notes (Signed)
Patient ID: [name], female   DOB: Jun 19, 1986, 34 y.o.   MRN: [id number] Got epidural about 3am Pitocin started UCs about every 2-3 min  Dilation: 2 Effacement (%): 50 Station: -3 Exam by:: Carmelia Roller CNM (this is admission exam)  Now that she has epidural I will recheck and consider AROM/IUPC

## 2020-03-16 NOTE — Progress Notes (Signed)
Labor Progress Note LURENA SLUYTER is a 34 y.o. [id number] at [redacted]w[redacted]d presented for IOL for gHTN. S: Mostly comfortable with epidural.   O:  BP (!) 87/57   Pulse 72   Temp 98 F (36.7 C) (Oral)   Resp 20   Ht 5\' 4"  (1.626 m)   Wt 115.9 kg   LMP 06/25/2019   BMI 43.85 kg/m  EFM: 140/ moderate variability, no accels, no decels TOCO: every 2-3 minutes   CVE: Dilation: 4 Effacement (%): 60 Cervical Position: Middle Station: -1, 0 Presentation: Vertex Exam by:: kristin mcleod   A&P: 34 y.o. [id number] [redacted]w[redacted]d here for IOL for gHTN. #Labor: Continue pitocin and s/p AROM. Now making cervical change. Cont Pit. Anticipate SVD.   #Pain: Epidural  #FWB: Category 1  #GBS negative #gHTN: Pr/Cr still pending. Normal range pressures currently. CMP WNL.  Chauncey Mann, MD 5:53 PM

## 2020-03-16 NOTE — Anesthesia Procedure Notes (Signed)
Epidural Patient location during procedure: OB Start time: 03/16/2020 2:50 AM End time: 03/16/2020 3:05 AM  Staffing Anesthesiologist: Freddrick March, MD Performed: anesthesiologist   Preanesthetic Checklist Completed: patient identified, IV checked, risks and benefits discussed, monitors and equipment checked, pre-op evaluation and timeout performed  Epidural Patient position: sitting Prep: DuraPrep and site prepped and draped Patient monitoring: continuous pulse ox, blood pressure, heart rate and cardiac monitor Approach: midline Location: L3-L4 Injection technique: LOR air  Needle:  Needle type: Tuohy  Needle gauge: 17 G Needle length: 9 cm Needle insertion depth: 6 cm Catheter type: closed end flexible Catheter size: 19 Gauge Catheter at skin depth: 12 cm Test dose: negative  Assessment Sensory level: T8 Events: blood not aspirated, injection not painful, no injection resistance, no paresthesia and negative IV test  Additional Notes Patient identified. Risks/Benefits/Options discussed with patient including but not limited to bleeding, infection, nerve damage, paralysis, failed block, incomplete pain control, headache, blood pressure changes, nausea, vomiting, reactions to medication both or allergic, itching and postpartum back pain. Confirmed with bedside nurse the patient's most recent platelet count. Confirmed with patient that they are not currently taking any anticoagulation, have any bleeding history or any family history of bleeding disorders. Patient expressed understanding and wished to proceed. All questions were answered. Sterile technique was used throughout the entire procedure. Please see nursing notes for vital signs. Test dose was given through epidural catheter and negative prior to continuing to dose epidural or start infusion. Warning signs of high block given to the patient including shortness of breath, tingling/numbness in hands, complete motor block, or  any concerning symptoms with instructions to call for help. Patient was given instructions on fall risk and not to get out of bed. All questions and concerns addressed with instructions to call with any issues or inadequate analgesia.  Reason for block:procedure for pain

## 2020-03-17 DIAGNOSIS — Z30017 Encounter for initial prescription of implantable subdermal contraceptive: Secondary | ICD-10-CM

## 2020-03-17 MED ORDER — PRENATAL MULTIVITAMIN CH
1.0000 | ORAL_TABLET | Freq: Every day | ORAL | Status: DC
  Filled 2020-03-17 (×2): qty 1

## 2020-03-17 MED ORDER — COCONUT OIL OIL: 1.0000 "application " | TOPICAL_OIL | Status: DC | PRN

## 2020-03-17 MED ORDER — SENNOSIDES-DOCUSATE SODIUM 8.6-50 MG PO TABS
2.0000 | ORAL_TABLET | ORAL | Status: DC
  Administered 2020-03-17: 2 via ORAL
  Filled 2020-03-17 (×2): qty 2

## 2020-03-17 MED ORDER — BENZOCAINE-MENTHOL 20-0.5 % EX AERO
1.0000 "application " | INHALATION_SPRAY | CUTANEOUS | Status: DC | PRN
  Administered 2020-03-17: 1 via TOPICAL
  Filled 2020-03-17: qty 56

## 2020-03-17 MED ORDER — DIPHENHYDRAMINE HCL 25 MG PO CAPS: 25.0000 mg | ORAL_CAPSULE | Freq: Four times a day (QID) | ORAL | Status: DC | PRN

## 2020-03-17 MED ORDER — DIBUCAINE (PERIANAL) 1 % EX OINT: 1.0000 "application " | TOPICAL_OINTMENT | CUTANEOUS | Status: DC | PRN

## 2020-03-17 MED ORDER — ONDANSETRON HCL 4 MG/2ML IJ SOLN: 4.0000 mg | INTRAMUSCULAR | Status: DC | PRN

## 2020-03-17 MED ORDER — SIMETHICONE 80 MG PO CHEW: 80.0000 mg | CHEWABLE_TABLET | ORAL | Status: DC | PRN

## 2020-03-17 MED ORDER — WITCH HAZEL-GLYCERIN EX PADS: 1.0000 "application " | MEDICATED_PAD | CUTANEOUS | Status: DC | PRN

## 2020-03-17 MED ORDER — IBUPROFEN 600 MG PO TABS
600.0000 mg | ORAL_TABLET | Freq: Four times a day (QID) | ORAL | Status: DC
  Administered 2020-03-17 – 2020-03-18 (×4): 600 mg via ORAL
  Filled 2020-03-17 (×5): qty 1

## 2020-03-17 MED ORDER — ACETAMINOPHEN 325 MG PO TABS
650.0000 mg | ORAL_TABLET | ORAL | Status: DC | PRN
  Administered 2020-03-17 – 2020-03-18 (×4): 650 mg via ORAL
  Filled 2020-03-17 (×4): qty 2

## 2020-03-17 MED ORDER — ZOLPIDEM TARTRATE 5 MG PO TABS: 5.0000 mg | ORAL_TABLET | Freq: Every evening | ORAL | Status: DC | PRN

## 2020-03-17 MED ORDER — ETONOGESTREL 68 MG ~~LOC~~ IMPL
68.0000 mg | DRUG_IMPLANT | Freq: Once | SUBCUTANEOUS | Status: AC
  Administered 2020-03-17: 68 mg via SUBCUTANEOUS
  Filled 2020-03-17: qty 1

## 2020-03-17 MED ORDER — LIDOCAINE HCL 1 % IJ SOLN
0.0000 mL | Freq: Once | INTRAMUSCULAR | Status: AC | PRN
  Administered 2020-03-17: 2 mL via INTRADERMAL
  Filled 2020-03-17: qty 20

## 2020-03-17 MED ORDER — TETANUS-DIPHTH-ACELL PERTUSSIS 5-2.5-18.5 LF-MCG/0.5 IM SUSP: 0.5000 mL | Freq: Once | INTRAMUSCULAR | Status: DC

## 2020-03-17 MED ORDER — ONDANSETRON HCL 4 MG PO TABS: 4.0000 mg | ORAL_TABLET | ORAL | Status: DC | PRN

## 2020-03-17 NOTE — Procedures (Signed)
[facility] INSERTION Patient name: [name] MRN [id number]  Date of birth: [birth year] Subjective Findings:   [name] is a 34 y.o. [id number] Caucasian female 1 day s/p SVB, who desires insertion of a Nexplanon.  Patient's last menstrual period was 06/25/2019. Last sexual intercourse was prior to birth of baby  Risks/benefits/side effects of Nexplanon have been discussed and her questions have been answered.  Specifically, a failure rate of 12/998 has been reported, with an increased failure rate if pt takes [place] and/or antiseizure medicaitons.  She is aware of the common side effect of irregular bleeding, which the incidence of decreases over time. Signed copy of informed consent in chart.   Pertinent History Reviewed:   Reviewed past medical,surgical, social, obstetrical and family history.  Reviewed problem list, medications and allergies. Objective Findings & Procedure:    Vitals:   03/17/20 0036 03/17/20 0213 03/17/20 0721 03/17/20 1250  BP: 127/85 114/87 (!) 103/58 134/79  Pulse: 76 82 60 60  Resp: 18 18 20 18   Temp: 99 F (37.2 C) 98.2 F (36.8 C) 98.3 F (36.8 C) 98.8 F (37.1 C)  TempSrc: Oral Oral Oral   SpO2:    99%  Weight:      Height:      Body mass index is 43.85 kg/m.   Time out was performed.  She is right-handed, so her left arm, approximately 10cm from the medial epicondyle and 3-5cm posterior to the sulcus, was cleansed with alcohol and anesthetized with 2cc of 2% Lidocaine.  The area was cleansed again with betadine and the Nexplanon was inserted per manufacturer's recommendations without difficulty.  3 steri-strips and pressure bandage were applied. The patient tolerated the procedure well.  Assessment & Plan:   1) Nexplanon insertion Pt was instructed to keep the area clean and dry, remove pressure bandage in 24 hours, and keep insertion site covered with the steri-strip for 3-5 days.   She was given a card indicating date Nexplanon  was inserted and date it needs to be removed.   [place], [facility] 03/17/2020 1:30 PM

## 2020-03-17 NOTE — Anesthesia Postprocedure Evaluation (Signed)
Anesthesia Post Note  Patient: [name]  Procedure(s) Performed: AN AD HOC LABOR EPIDURAL     Patient location during evaluation: Mother Baby Anesthesia Type: Epidural Level of consciousness: awake and alert and oriented Pain management: satisfactory to patient Vital Signs Assessment: post-procedure vital signs reviewed and stable Respiratory status: respiratory function stable Cardiovascular status: stable Postop Assessment: no headache, no backache, epidural receding, patient able to bend at knees, no signs of nausea or vomiting and adequate PO intake Anesthetic complications: no    Last Vitals:  Vitals:   03/17/20 0213 03/17/20 0721  BP: 114/87 (!) 103/58  Pulse: 82 60  Resp: 18 20  Temp: 36.8 C 36.8 C    Last Pain:  Vitals:   03/17/20 0721  TempSrc: Oral  PainSc:    Pain Goal:                   [name]

## 2020-03-17 NOTE — Plan of Care (Signed)
  Problem: Education: Goal: Knowledge of General Education information will improve Description: Including pain rating scale, medication(s)/side effects and non-pharmacologic comfort measures Outcome: Completed/Met   Problem: Clinical Measurements: Goal: Ability to maintain clinical measurements within normal limits will improve Outcome: Completed/Met Goal: Will remain free from infection Outcome: Completed/Met Goal: Diagnostic test results will improve Outcome: Completed/Met Goal: Respiratory complications will improve Outcome: Completed/Met Goal: Cardiovascular complication will be avoided Outcome: Completed/Met   Problem: Activity: Goal: Risk for activity intolerance will decrease Outcome: Completed/Met   Problem: Nutrition: Goal: Adequate nutrition will be maintained Outcome: Completed/Met   Problem: Elimination: Goal: Will not experience complications related to urinary retention Outcome: Completed/Met   Problem: Pain Managment: Goal: General experience of comfort will improve Outcome: Completed/Met   Problem: Safety: Goal: Ability to remain free from injury will improve Outcome: Completed/Met   Problem: Education: Goal: Knowledge of condition will improve Outcome: Completed/Met   Problem: Activity: Goal: Will verbalize the importance of balancing activity with adequate rest periods Outcome: Completed/Met Goal: Ability to tolerate increased activity will improve Outcome: Completed/Met   Problem: Life Cycle: Goal: Chance of risk for complications during the postpartum period will decrease Outcome: Completed/Met   Problem: Role Relationship: Goal: Ability to demonstrate positive interaction with newborn will improve Outcome: Completed/Met

## 2020-03-17 NOTE — Clinical Social Work Maternal (Signed)
CLINICAL SOCIAL WORK MATERNAL/CHILD NOTE  Patient Details  Name: [name] MRN: [id number] Date of Birth: 02/23/1986  Date:  09-29-2020  Clinical Social Worker Initiating Note:  [name], [name], [name] Date/Time: Initiated:  03/17/20/1615     Child's Name:  [name]   Biological Parents:  Father, Mother([name], [name], [name], [name])   Need for Interpreter:  None   Reason for Referral:  Current CPS Involvement, Behavioral Health Concerns   Address:  [place] [place] [address]    Phone number:  [phone] (home)     Additional phone number: none offered  Household Members/Support Persons (HM/SP):   Household Member/Support Person 1   HM/SP Name Relationship DOB or Age  HM/SP -[place] [name] 6/[name]  HM/SP -2        HM/SP -3        HM/SP -4        HM/SP -5        HM/SP -6        HM/SP -7        HM/SP -8          Natural Supports (not living in the home):  Spouse/significant other   Professional Supports: Therapist   Employment: Disabled   Type of Work:     Education:  Programmer, systems   Homebound arranged:    Museum/gallery curator Resources:  [employer]    Other Resources:  Physicist, medical , [place] Considerations Which May Impact Care:  None stated  Strengths:  Pediatrician chosen   Psychotropic Medications:         Pediatrician:    [place]  Pediatrician List:   [facility] for [place]      Pediatrician Fax Number:    Risk Factors/Current Problems:  DHHS Involvement , Mental Health Concerns    Cognitive State:  Alert , Able to Concentrate , Goal Oriented , Insightful    Mood/Affect:  Anxious and Fearful   CSW Assessment: CSW met with [name] in room 509 to complete an assessment for CPS hx (older children in kinship placement) and MH hx.  When CSW arrived, [name] was resting in  bed, [name] was laying on couch, and infant was asleep in the bassinet.  [name] gave CSW permission to meet with [name] while [name] was present. [name] stated "H The scent from the room was evidence that the family had poor hygiene.  However, during the assessment they were attentive of the baby and very supportive of one another.    CSW asked about CPS hx and [name] and [name] openly shared that they do not have custody of other four children, as they are in kinship placement. Danton Sewer [place]-02/11/2016-with cousin Verna Czech, [place].- 02/03/17-with [place].Audry Pili Mood-02/19/2018-with uncle and aunt Daiva Nakayama and Georgette Shell, and Grady Lepp-12/17/2018- with uncle and aunt Daiva Nakayama and Georgette Shell.  CSW asked several questions regarding CPS involvement and CSW made the family aware that due to the family's CPS hx CSW will need to make another CPS case; [name] was understanding yet disappointed. [name] stated she was very anxious; however, she is hopeful she will be able to take infant home due to having resources in place.      [name] and [name] reported having all essential items and feels prepared to parent.  Both parents  were knowledgeable about SIDS and responded appropriately to CSW questions about Safe Sleep. [name] and [name] reported being active with [employer] and [employer].    CSW asked about [name]'s MH hx and [name] acknowledged a hx of bipolar disorder, depression, ADHD, and anxiety.  [name] shared she is consistently taking all medications, Seroquel and Zoloft. [name] stated she brought them with her and has continued taking them since admission. [name] reports seeing therapist, Gaylan Gerold at [facility], [phone], 1x weekly, for two years. [name] reports plan to increase session to 2x weekly now that she has delivered for additional support postpartum.  [name] denied any current BH needs or concerns, and expressed therapy and medication have been very helpful with managing sx.     CSW provided education regarding the baby blues period vs. perinatal mood disorders, discussed treatment and gave resources for mental health follow up if concerns arise.  CSW recommends self-evaluation during the postpartum time period using the New Mom Checklist from Postpartum Progress and encouraged [name] and [name] to contact a medical professional if symptoms are noted at any time. CSW assessed for safety and [name] and [name] denied SI, HI, DV.    CSW provided review of Sudden Infant Death Syndrome (SIDS) precautions. [name] and [name] were knowledgeable about all safety recommendations, and confirmed having new car seat, and bassinet for baby's safe sleeping area.    CSW contacted CPS to make a report related to safety and custody hx. CPS investigator, Myriam Jacobson asked that infant not be discharged until safety plan is developed by CPS. Myriam Jacobson stated plans to call this CSW tomorrow morning with update.   CSW recommends infant not be discharged at this time, as there are barriers to discharging infant with [name]. CSW awaiting CPS disposition/safety plan. CSW provided RN, Sharyn Lull with [place] emergency number if any concerns occur overnight.         CSW Plan/Description:  Sudden Infant Death Syndrome (SIDS) Education, Perinatal Mood and Anxiety Disorder (PMADs) Education, Child Protective Service Report , CSW Awaiting CPS Disposition Plan   Darlyn Repsher D. Lissa Morales, [name], [facility] Clinical Social Worker [phone] 11/28/20, 5:06 PM

## 2020-03-17 NOTE — Plan of Care (Signed)
  Problem: Skin Integrity: Goal: Risk for impaired skin integrity will decrease Outcome: Completed/Met   Problem: Skin Integrity: Goal: Demonstration of wound healing without infection will improve Outcome: Completed/Met   

## 2020-03-17 NOTE — Plan of Care (Signed)
  Problem: Education: Goal: Knowledge of General Education information will improve Description: Including pain rating scale, medication(s)/side effects and non-pharmacologic comfort measures Outcome: Completed/Met   Problem: Activity: Goal: Risk for activity intolerance will decrease Outcome: Completed/Met   Problem: Pain Managment: Goal: General experience of comfort will improve Outcome: Completed/Met   Problem: Safety: Goal: Ability to remain free from injury will improve Outcome: Completed/Met   Problem: Activity: Goal: Will verbalize the importance of balancing activity with adequate rest periods Outcome: Completed/Met   Problem: Life Cycle: Goal: Chance of risk for complications during the postpartum period will decrease Outcome: Completed/Met   Problem: Role Relationship: Goal: Ability to demonstrate positive interaction with newborn will improve Outcome: Completed/Met   Problem: Education: Goal: Knowledge of General Education information will improve Description: Including pain rating scale, medication(s)/side effects and non-pharmacologic comfort measures Outcome: Completed/Met   Problem: Activity: Goal: Risk for activity intolerance will decrease Outcome: Completed/Met   Problem: Pain Managment: Goal: General experience of comfort will improve Outcome: Completed/Met   Problem: Safety: Goal: Ability to remain free from injury will improve Outcome: Completed/Met   Problem: Activity: Goal: Will verbalize the importance of balancing activity with adequate rest periods Outcome: Completed/Met   Problem: Life Cycle: Goal: Chance of risk for complications during the postpartum period will decrease Outcome: Completed/Met   Problem: Role Relationship: Goal: Ability to demonstrate positive interaction with newborn will improve Outcome: Completed/Met   Problem: Education: Goal: Knowledge of General Education information will improve Description: Including  pain rating scale, medication(s)/side effects and non-pharmacologic comfort measures Outcome: Completed/Met   Problem: Activity: Goal: Risk for activity intolerance will decrease Outcome: Completed/Met   Problem: Pain Managment: Goal: General experience of comfort will improve Outcome: Completed/Met   Problem: Safety: Goal: Ability to remain free from injury will improve Outcome: Completed/Met   Problem: Activity: Goal: Will verbalize the importance of balancing activity with adequate rest periods Outcome: Completed/Met   Problem: Life Cycle: Goal: Chance of risk for complications during the postpartum period will decrease Outcome: Completed/Met   Problem: Role Relationship: Goal: Ability to demonstrate positive interaction with newborn will improve Outcome: Completed/Met

## 2020-03-17 NOTE — Progress Notes (Signed)
Post Partum Day 1  Subjective: no complaints, voiding, tolerating PO and + flatus  Objective: Blood pressure 114/87, pulse 82, temperature 98.2 F (36.8 C), temperature source Oral, resp. rate 18, height 5\' 4"  (1.626 m), weight 115.9 kg, last menstrual period 06/25/2019, unknown if currently breastfeeding.  Physical Exam:  General: alert and no distress Lochia: appropriate Uterine Fundus: firm DVT Evaluation: No evidence of DVT seen on physical exam. No cords or calf tenderness.  Recent Labs    03/16/20 0107  HGB 11.8*  HCT 36.9    Assessment/Plan: Plan for discharge tomorrow, Social Work consult and Contraception :Nexplanon    LOS: 1 day   Lyndee Hensen, DO PGY-1, [facility] 03/17/2020 8:19 AM

## 2020-03-17 NOTE — Progress Notes (Signed)
Patient discovered through MyChart that a SW consult had been ordered.  Pt expressed concern multiple times re: the consult delaying her discharge.  Pt asked RN to call twice to see when the SW would see her.  Social worker was contacted, who said she would see the patient this afternoon.  At the end of the RN's shift she asked when she could be discharged since she felt she had to go care for her dog.  The patient was discouraged from leaving her infant to go home even briefly, as she expressed this as her intent.  Her significant other stated he could go check on the dog.  The RN referred the patient to discussing this situation with the social worker, who is better equipped to answer this type of question.  Ronney Lion Ailin Rochford Therapist, sports

## 2020-03-18 MED ORDER — IBUPROFEN 600 MG PO TABS: 600.0000 mg | ORAL_TABLET | Freq: Four times a day (QID) | ORAL | 0 refills | Status: DC

## 2020-03-18 NOTE — Progress Notes (Signed)
CSW received call from [place]. [name] stated plans to come visit with [name] at bedside today to develop safe discharge plan.   CSW will assist as needed after arrival.   Gaige Sebo D. Lissa Morales, MSW, [facility] Clinical Social Worker [phone]

## 2020-03-29 NOTE — BH Specialist Note (Deleted)
Integrated Behavioral Health via Telemedicine Video Visit  03/29/2020 SUKANYA ENZ [id number]  Number of Integrated Behavioral Health visits: 1 Session Start time: 2:15***  Session End time: 3:!5*** Total time: {IBH Total [id number]  Referring Provider: Darrol Poke, CNM Type of Visit: Video Patient/Family location: [facility] Provider location: [facility] All persons participating in visit: Patient *** and [place] ***   Confirmed patient's address: Yes  Confirmed patient's phone number: Yes  Any changes to demographics: No   Confirmed patient's insurance: Yes  Any changes to patient's insurance: No   Discussed confidentiality: Yes   I connected with [place]  by a video enabled telemedicine application and verified that I am speaking with the correct person using two identifiers.     I discussed the limitations of evaluation and management by telemedicine and the availability of in person appointments.  I discussed that the purpose of this visit is to provide behavioral health care while limiting exposure to the novel coronavirus.   Discussed there is a possibility of technology failure and discussed alternative modes of communication if that failure occurs.  I discussed that engaging in this video visit, they consent to the provision of behavioral healthcare and the services will be billed under their insurance.  Patient and/or legal guardian expressed understanding and consented to video visit: Yes   PRESENTING CONCERNS: Patient and/or family reports the following symptoms/concerns: *** Duration of problem: ***; Severity of problem: {Mild/Moderate/Severe:20260}  STRENGTHS (Protective Factors/Coping Skills): ***  GOALS ADDRESSED: Patient will: 1.  Reduce symptoms of: {IBH Symptoms:21014056}  2.  Increase knowledge and/or ability of: {IBH Patient Tools:21014057}  3.  Demonstrate ability to: {IBH  Goals:21014053}  INTERVENTIONS: Interventions utilized:  {IBH Interventions:21014054} Standardized Assessments completed: {IBH Screening Tools:21014051}  ASSESSMENT: Patient currently experiencing ***.   Patient may benefit from ***.  PLAN: 1. Follow up with behavioral health clinician on : *** 2. Behavioral recommendations:  -*** -*** 3. Referral(s): {IBH Referrals:21014055}  I discussed the assessment and treatment plan with the patient and/or parent/guardian. They were provided an opportunity to ask questions and all were answered. They agreed with the plan and demonstrated an understanding of the instructions.   They were advised to call back or seek an in-person evaluation if the symptoms worsen or if the condition fails to improve as anticipated.  [name] Kallista Pae

## 2020-04-02 ENCOUNTER — Ambulatory Visit: Payer: Medicare Other

## 2020-04-05 ENCOUNTER — Other Ambulatory Visit: Payer: Self-pay

## 2020-04-05 ENCOUNTER — Ambulatory Visit (INDEPENDENT_AMBULATORY_CARE_PROVIDER_SITE_OTHER)
Admission: RE | Admit: 2020-04-05 | Discharge: 2020-04-05 | Disposition: A | Discharge status: Home / Self Care | Payer: Medicare Other | Source: Ambulatory Visit

## 2020-04-05 DIAGNOSIS — J069 Acute upper respiratory infection, unspecified: Secondary | ICD-10-CM | POA: Diagnosis not present

## 2020-04-05 MED ORDER — CETIRIZINE HCL 10 MG PO TABS: 10.0000 mg | ORAL_TABLET | Freq: Every day | ORAL | 0 refills | Status: DC

## 2020-04-05 MED ORDER — BENZONATATE 100 MG PO CAPS: 100.0000 mg | ORAL_CAPSULE | Freq: Three times a day (TID) | ORAL | 0 refills | Status: DC

## 2020-04-05 MED ORDER — FLUTICASONE PROPIONATE 50 MCG/ACT NA SUSP: 2.0000 | Freq: Every day | NASAL | 0 refills | Status: DC

## 2020-04-05 NOTE — ED Provider Notes (Signed)
[facility] Virtual Visit via Video Note:  [name]  initiated request for Telemedicine visit with [facility] team. I connected with [name]  on 04/05/2020 at 6:36 PM  for a synchronized telemedicine visit using a video enabled HIPPA compliant telemedicine application. I verified that I am speaking with [name]  using two identifiers. [place], [name]  was physically located in a [place] [facility] site and [name] was located at a different location.   The limitations of evaluation and management by telemedicine as well as the availability of in-person appointments were discussed. Patient was informed that she  may incur a bill ( including co-pay) for this virtual visit encounter. [name]  expressed understanding and gave verbal consent to proceed with virtual visit.   [id number] 04/05/20 Arrival Time: 6967  CC: URI symptoms   SUBJECTIVE: History from: patient.  [name] is a 34 y.o. female who presents with sinus congestion, sore throat, runny nose and mild dry cough x 3-4 days.  Denies sick exposure to COVID, flu or strep.  Husband with similar cough.  Both did send out COVID test and was negative.  Has tried mucinex without relief.  Symptoms are made worse with mucinex.  Reports previous symptoms in the past with allergies and a cold.   Denies fever, chills, fatigue, sore throat, SOB, wheezing, chest pain, nausea, changes in bowel or bladder habits.    ROS: As per HPI.  All other pertinent ROS negative.     Past Medical History:  Diagnosis Date  . Anxiety   . Asthma    inhaler PRN-hasn't used inhaler in months  . Attention deficit hyperactivity disorder   . Bipolar disorder [facility])    hospitalized as a teen for suicidal ideations and cutting  . Depression    no complaints, doing well  . Gastritis   . GERD (gastroesophageal reflux disease)   . HPV (human papilloma virus) anogenital  infection   . Hx of varicella   . Mild preeclampsia 02/09/2016  . Pregnancy induced hypertension   . Vaginal Pap smear, abnormal    Past Surgical History:  Procedure Laterality Date  . EYE MUSCLE SURGERY Bilateral    07/29/12  . MOUTH SURGERY    . PLANTAR FASCIA SURGERY    . WISDOM TOOTH EXTRACTION     Allergies  Allergen Reactions  . Depakote [Divalproex Sodium] Other (See Comments)    Pt states that this medication makes her blood levels toxic.    [name] Methylphenidate Derivatives Other (See Comments)    Reaction:  Depression and anger   . Neurontin [Gabapentin] Other (See Comments)    Reaction:  Dizziness   . Prozac [Fluoxetine Hcl] Other (See Comments)    Reaction:  Anger   . Methylphenidate Hcl     SAME AS PREVIOUS LISTED METHYLPHENIDATE   No current facility-administered medications on file prior to encounter.   Current Outpatient Medications on File Prior to Encounter  Medication Sig Dispense Refill  . aspirin 325 MG tablet Take 325 mg by mouth daily. Gestational hypertension    . Blood Pressure Monitoring (BLOOD PRESSURE KIT) DEVI 1 Device by Does not apply route as needed. ICD 10 O09.90 1 Device 0  . Elastic Bandages & Supports (COMFORT FIT MATERNITY SUPP LG) MISC 1 Units by Does not apply route daily as needed. (Patient not taking: Reported on 03/07/2020) 1 each 0  . famotidine (PEPCID) 20 MG tablet Take  1 tablet (20 mg total) by mouth 2 (two) times daily. 60 tablet 1  . ibuprofen (ADVIL) 600 MG tablet Take 1 tablet (600 mg total) by mouth every 6 (six) hours. 30 tablet 0  . Prenatal Vit w/Fe-Methylfol-FA (PNV PO) Take by mouth.    . QUEtiapine (SEROQUEL) 25 MG tablet Take 25 mg by mouth at bedtime.    . Sertraline HCl (ZOLOFT PO) Take 10 mg by mouth at bedtime.      OBJECTIVE:  There were no vitals filed for this visit.   General appearance: alert; no distress Eyes: EOMI grossly HENT: normocephalic; atraumatic Neck: supple with FROM Lungs: normal respiratory  effort; speaking in full sentences without difficulty Extremities: moves extremities without difficulty Skin: No obvious rashes Neurologic: No facial asymmetries Psychological: alert and cooperative; normal mood and affect  ASSESSMENT & PLAN:  1. Viral URI with cough     Meds ordered this encounter  Medications  . benzonatate (TESSALON) 100 MG capsule    Sig: Take 1 capsule (100 mg total) by mouth every 8 (eight) hours.    Dispense:  21 capsule    Refill:  0    Order Specific Question:   Supervising Provider    Answer:   Raylene Everts [[id number]]  . cetirizine (ZYRTEC) 10 MG tablet    Sig: Take 1 tablet (10 mg total) by mouth daily.    Dispense:  20 tablet    Refill:  0    Order Specific Question:   Supervising Provider    Answer:   Raylene Everts [[id number]]  . fluticasone (FLONASE) 50 MCG/ACT nasal spray    Sig: Place 2 sprays into both nostrils daily.    Dispense:  16 g    Refill:  0    Order Specific Question:   Supervising Provider    Answer:   Raylene Everts [[id number]]   Get plenty of rest and push fluids Zyrtec for nasal congestion, runny nose, and/or sore throat Flonase as needed for nasal congestion and runny nose Tessalon perles prescribed.  Use as needed for cough Use medications daily for symptom relief Use OTC medications like ibuprofen or tylenol as needed fever or pain Call or go to the ED if you have any new or worsening symptoms such as fever, cough, shortness of breath, chest tightness, chest pain, turning blue, changes in mental status, etc...   I discussed the assessment and treatment plan with the patient. The patient was provided an opportunity to ask questions and all were answered. The patient agreed with the plan and demonstrated an understanding of the instructions.   The patient was advised to call back or seek an in-person evaluation if the symptoms worsen or if the condition fails to improve as anticipated.  I provided 7 minutes of  non-face-to-face time during this encounter.  Lestine Box, [name]  04/05/2020 6:36 PM          Lestine Box, [name] 04/05/20 1836

## 2020-04-05 NOTE — Discharge Instructions (Addendum)
Get plenty of rest and push fluids Zyrtec for nasal congestion, runny nose, and/or sore throat Flonase as needed for nasal congestion and runny nose Tessalon perles prescribed.  Use as needed for cough Use medications daily for symptom relief Use OTC medications like ibuprofen or tylenol as needed fever or pain Call or go to the ED if you have any new or worsening symptoms such as fever, cough, shortness of breath, chest tightness, chest pain, turning blue, changes in mental status, etc..[name]

## 2020-04-05 NOTE — BH Specialist Note (Signed)
Integrated Behavioral Health via Telemedicine Video Visit  04/05/2020 [name] [id number]  Number of Integrated Behavioral Health visits: 1 Session Start time: 8:14  Session End time: 8:25 Total time: 11  Referring Provider: Darrol Poke, CNM Type of Visit: Video Patient/Family location: Home [facility] Provider location: [place] All persons participating in visit: Patient [name] and [place]    Confirmed patient's address: Yes  Confirmed patient's phone number: Yes  Any changes to demographics: No   Confirmed patient's insurance: Yes  Any changes to patient's insurance: No   Discussed confidentiality: Yes   I connected with [name]  by a video enabled telemedicine application and verified that I am speaking with the correct person using two identifiers.     I discussed the limitations of evaluation and management by telemedicine and the availability of in person appointments.  I discussed that the purpose of this visit is to provide behavioral health care while limiting exposure to the novel coronavirus.   Discussed there is a possibility of technology failure and discussed alternative modes of communication if that failure occurs.  I discussed that engaging in this video visit, they consent to the provision of behavioral healthcare and the services will be billed under their insurance.  Patient and/or legal guardian expressed understanding and consented to video visit: Yes   PRESENTING CONCERNS: Patient and/or family reports the following symptoms/concerns: Pt states she has no concerns today, is coping well postpartum, is continuing to see her therapist, taking medication as prescribed.  Duration of problem: -; Severity of problem: -  STRENGTHS (Protective Factors/Coping Skills): Adhering to treatment  GOALS ADDRESSED: 1.  2.  Demonstrate ability to: Increase adequate support systems for patient/family  INTERVENTIONS: Interventions  utilized:  Link to [employer] Standardized Assessments completed: GAD-7 and PHQ 9  ASSESSMENT: Patient currently experiencing At risk of depression postpartum.  Patient may benefit from mood check today.  [name]  PLAN: 1. Follow up with behavioral health clinician on : As needed 2. Behavioral recommendations:  -Continue seeing therapist twice weekly -Continue taking all medication as prescribed -Consider registering for and attending Mom Talk, online new mom support group at [email or web address] 3. Referral(s): Integrated Orthoptist (In Clinic) and [employer] Resources:  New mom support  I discussed the assessment and treatment plan with the patient and/or parent/guardian. They were provided an opportunity to ask questions and all were answered. They agreed with the plan and demonstrated an understanding of the instructions.   They were advised to call back or seek an in-person evaluation if the symptoms worsen or if the condition fails to improve as anticipated.  [name] [facility]  Depression screen [facility] 2/9 04/06/2020 03/07/2020 10/24/2019 09/12/2019 03/09/2019  Decreased Interest 0 0 0 0 0  Down, Depressed, Hopeless 0 0 0 1 0  PHQ - 2 Score 0 0 0 1 0  Altered sleeping 0 0 0 3 -  Tired, decreased energy 0 0 0 0 -  Change in appetite 0 0 3 3 -  Feeling bad or failure about yourself  0 0 0 0 -  Trouble concentrating 0 0 0 0 -  Moving slowly or fidgety/restless 0 0 0 0 -  Suicidal thoughts 0 0 0 0 -  PHQ-9 Score 0 0 3 7 -  Some recent data might be hidden   GAD 7 : Generalized Anxiety Score 04/06/2020 03/07/2020 10/24/2019 09/12/2019  Nervous, Anxious, on Edge 0 0 0 2  Control/stop worrying 0 0 0 0  Worry too much - different things 0 0 0 0  Trouble relaxing 0 0 0 0  Restless 0 0 0 0  Easily annoyed or irritable 0 0 0 3  Afraid - awful might happen 0 0 0 0  Total GAD 7 Score 0 0 0 5

## 2020-04-06 ENCOUNTER — Ambulatory Visit (INDEPENDENT_AMBULATORY_CARE_PROVIDER_SITE_OTHER): Payer: Medicare Other | Admitting: Clinical

## 2020-04-06 DIAGNOSIS — Z9189 Other specified personal risk factors, not elsewhere classified: Secondary | ICD-10-CM

## 2020-04-06 NOTE — Patient Instructions (Signed)
 *   New mom support group: Mom Talk. Register at [email or web address]

## 2020-04-07 ENCOUNTER — Ambulatory Visit (INDEPENDENT_AMBULATORY_CARE_PROVIDER_SITE_OTHER)
Admission: RE | Admit: 2020-04-07 | Discharge: 2020-04-07 | Disposition: A | Discharge status: Home / Self Care | Payer: Medicare Other | Source: Ambulatory Visit

## 2020-04-07 DIAGNOSIS — R0981 Nasal congestion: Secondary | ICD-10-CM

## 2020-04-07 DIAGNOSIS — R05 Cough: Secondary | ICD-10-CM

## 2020-04-07 DIAGNOSIS — R059 Cough, unspecified: Secondary | ICD-10-CM

## 2020-04-07 DIAGNOSIS — J069 Acute upper respiratory infection, unspecified: Secondary | ICD-10-CM | POA: Diagnosis not present

## 2020-04-07 MED ORDER — LEVOCETIRIZINE DIHYDROCHLORIDE 5 MG PO TABS: 5.0000 mg | ORAL_TABLET | Freq: Every evening | ORAL | 0 refills | Status: DC

## 2020-04-07 NOTE — ED Provider Notes (Addendum)
Virtual Visit via Video Note:  [name]  initiated request for Telemedicine visit with [facility] team. I connected with Nia Nathaniel Cowin  on 04/07/2020 at 3:18 PM  for a synchronized telemedicine visit using a video enabled HIPPA compliant telemedicine application. I verified that I am speaking with [name]  using two identifiers. Jaynee Eagles, PA-C  was physically located in a [facility]gent care site and MIELA DESJARDIN was located at a different location.   The limitations of evaluation and management by telemedicine as well as the availability of in-person appointments were discussed. Patient was informed that she  may incur a bill ( including co-pay) for this virtual visit encounter. Lisandra R Ovando  expressed understanding and gave verbal consent to proceed with virtual visit.     History of Present Illness:[name]  is a 34 y.o. female reporting more than 1 week hx of nasal congestion, cough, throat pain. Patient had an OV yesterday, reported 3-4 day hx of sx then of same sx. Was advised that she had a viral URI with cough, prescribed Flonase, Zyrtec and benzonatate. Her pharmacy only filled Flonase and told her that no other medications were covered by Medicaid. Today, she is requesting a Z-pack as this is covered.   ROS  No current facility-administered medications for this encounter.   Current Outpatient Medications  Medication Sig Dispense Refill  . aspirin 325 MG tablet Take 325 mg by mouth daily. Gestational hypertension    . benzonatate (TESSALON) 100 MG capsule Take 1 capsule (100 mg total) by mouth every 8 (eight) hours. 21 capsule 0  . Blood Pressure Monitoring (BLOOD PRESSURE KIT) DEVI 1 Device by Does not apply route as needed. ICD 10 O09.90 1 Device 0  . cetirizine (ZYRTEC) 10 MG tablet Take 1 tablet (10 mg total) by mouth daily. 20 tablet 0  . Elastic Bandages & Supports (COMFORT FIT MATERNITY SUPP LG) MISC 1 Units by  Does not apply route daily as needed. (Patient not taking: Reported on 03/07/2020) 1 each 0  . famotidine (PEPCID) 20 MG tablet Take 1 tablet (20 mg total) by mouth 2 (two) times daily. 60 tablet 1  . fluticasone (FLONASE) 50 MCG/ACT nasal spray Place 2 sprays into both nostrils daily. 16 g 0  . ibuprofen (ADVIL) 600 MG tablet Take 1 tablet (600 mg total) by mouth every 6 (six) hours. 30 tablet 0  . levocetirizine (XYZAL) 5 MG tablet Take 1 tablet (5 mg total) by mouth every evening. 30 tablet 0  . Prenatal Vit w/Fe-Methylfol-FA (PNV PO) Take by mouth.    . QUEtiapine (SEROQUEL) 25 MG tablet Take 25 mg by mouth at bedtime.    . Sertraline HCl (ZOLOFT PO) Take 10 mg by mouth at bedtime.       Allergies  Allergen Reactions  . Depakote [Divalproex Sodium] Other (See Comments)    Pt states that this medication makes her blood levels toxic.    [name] Methylphenidate Derivatives Other (See Comments)    Reaction:  Depression and anger   . Neurontin [Gabapentin] Other (See Comments)    Reaction:  Dizziness   . Prozac [Fluoxetine Hcl] Other (See Comments)    Reaction:  Anger   . Methylphenidate Hcl     SAME AS PREVIOUS LISTED METHYLPHENIDATE     Past Medical History:  Diagnosis Date  . Anxiety   . Asthma    inhaler PRN-hasn't used inhaler in months  . Attention deficit  hyperactivity disorder   . Bipolar disorder [facility])    hospitalized as a teen for suicidal ideations and cutting  . Depression    no complaints, doing well  . Gastritis   . GERD (gastroesophageal reflux disease)   . HPV (human papilloma virus) anogenital infection   . Hx of varicella   . Mild preeclampsia 02/09/2016  . Pregnancy induced hypertension   . Vaginal Pap smear, abnormal     Past Surgical History:  Procedure Laterality Date  . EYE MUSCLE SURGERY Bilateral    07/29/12  . MOUTH SURGERY    . PLANTAR FASCIA SURGERY    . WISDOM TOOTH EXTRACTION        Observations/Objective: Physical Exam Constitutional:       General: She is not in acute distress.    Appearance: Normal appearance. She is well-developed. She is not ill-appearing, toxic-appearing or diaphoretic.  Eyes:     Extraocular Movements: Extraocular movements intact.  Pulmonary:     Effort: Pulmonary effort is normal.  Neurological:     General: No focal deficit present.     Mental Status: She is alert and oriented to person, place, and time.  Psychiatric:        Mood and Affect: Mood normal.        Behavior: Behavior normal.        Thought Content: Thought content normal.        Judgment: Judgment normal.      Assessment and Plan:  PDMP not reviewed this encounter.  1. Viral URI with cough   2. Cough   3. Nasal congestion    Agree with NP W[place]st, patient has viral URI with cough. Counseled that cetirizine is on the formulary for Medicaid and recommended using a different pharmacy.  However, patient refused this.  Offered levocetirizine as this should be covered by [facility] and sent this to the same pharmacy.  Informed patient that cough medications are not covered by Medicaid but was willing to prescribe her cough syrup in any case.  Patient refused.  Emphasized need for in person evaluation for lung exam, consideration for chest x-ray to address her concern about needing azithromycin.  Counseled on antibiotic stewardship.  ER precautions reviewed.  Of note, when I counseled against antibiotic use without proper evaluation patient stated, "This is bullshit." I promptly asked patient from using that language. We were able to continue with the visit thereafter.   Follow Up Instructions:    I discussed the assessment and treatment plan with the patient. The patient was provided an opportunity to ask questions and all were answered. The patient agreed with the plan and demonstrated an understanding of the instructions.   The patient was advised to call back or seek an in-person evaluation if the symptoms worsen or if the condition  fails to improve as anticipated.  I provided 10 minutes of non-face-to-face time during this encounter.    Jaynee Eagles, PA-C  04/07/2020 3:18 PM         Jaynee Eagles, PA-C 04/07/20 1526

## 2020-04-17 NOTE — Telephone Encounter (Signed)
Please call her and schedule her for nexplanon removal

## 2020-05-01 ENCOUNTER — Ambulatory Visit: Payer: Medicare Other | Admitting: Women's Health

## 2020-05-30 ENCOUNTER — Encounter: Payer: Self-pay | Admitting: Women's Health

## 2020-05-30 ENCOUNTER — Telehealth (INDEPENDENT_AMBULATORY_CARE_PROVIDER_SITE_OTHER): Payer: Medicare Other | Admitting: Women's Health

## 2020-05-30 ENCOUNTER — Other Ambulatory Visit: Payer: Self-pay

## 2020-05-30 DIAGNOSIS — N939 Abnormal uterine and vaginal bleeding, unspecified: Secondary | ICD-10-CM

## 2020-05-30 DIAGNOSIS — Z1332 Encounter for screening for maternal depression: Secondary | ICD-10-CM | POA: Diagnosis not present

## 2020-05-30 NOTE — Progress Notes (Signed)
I connected with [name] on 05/30/20 at  2:55 PM EDT by: Mychart video and verified that I am speaking with the correct person using two identifiers.  Patient is located at home and provider is located at [employer] for [employer] at [employer] [facility] .     The purpose of this virtual visit is to provide medical care while limiting exposure to the novel coronavirus. I discussed the limitations, risks, security and privacy concerns of performing an evaluation and management service by MyChart and the availability of in person appointments. I also discussed with the patient that there may be a patient responsible charge related to this service. By engaging in this virtual visit, you consent to the provision of healthcare.  Additionally, you authorize for your insurance to be billed for the services provided during this visit.  The patient expressed understanding and agreed to proceed.  The following staff members participated in the virtual visit:  Vernice Jefferson  Post Partum Visit Note Subjective:   [name] is a 34 y.o. [phone] female being evaluated for postpartum followup.  She is 10 weeks postpartum following a normal spontaneous vaginal delivery at  37 gestational weeks.  I have fully reviewed the prenatal and intrapartum course; pregnancy complicated by gHTN, asthma, OSA, GERD, N/V, mental health concerns.  Postpartum course has been uncomplicated. Baby is doing well. Baby is feeding by bottle - Carnation Good Start. Bleeding thick, heavy lochia. Bowel function is normal. Bladder function is normal. Patient is sexually active. Contraception method is Nexplanon. Postpartum depression screening: negative.  Patient reports heavy bleeding that has not stopped since giving birth on April 2. Patient describes bleeding as "like a period" and patient reports she is using 7-10 tampons per day and is using super tampons. Patient reports the bleeding does not get better or  worse, but has stayed the same. Patient reports she is on the Nexplanon and had it inserted in the hospital. Patient denies pelvic/abdominal pain.  The following portions of the patient's history were reviewed and updated as appropriate: allergies, current medications, past family history, past medical history, past social history, past surgical history and problem list.  Review of Systems Pertinent items noted in HPI and remainder of comprehensive ROS otherwise negative.   Objective:  There were no vitals filed for this visit. Patient does have BP cuff, but states she is unable to take her BP at this time. Patient states her BP "has been perfect" and states she will call us or send or send [place] a MyChart message later today with her measurement.       Assessment:    Abnormal postpartum exam due to bleeding, need for Pap.  Plan:  Essential components of care per ACOG recommendations:  1.  Mood and well being: Patient with negative depression screening today. Reviewed local resources for support.  - Patient does not use tobacco. - hx of drug use? No  2. Infant care and feeding:  -Patient currently breastmilk feeding? No -Social determinants of health (SDOH) reviewed in EPIC.  3. Sexuality, contraception and birth spacing - Patient does want a pregnancy in the next year.  Desired family size is unknown children.  - Reviewed forms of contraception in tiered fashion. Patient desired Nexplanon today.   - Discussed birth spacing of 18 months, but patient states she is considering removal of Nexplanon at next office visit  4. Sleep and fatigue -Encouraged family/partner/community support of 4 hrs of uninterrupted sleep to  help with mood and fatigue  5. Physical Recovery  - Discussed patients delivery and complications - Patient had no lacerations during delivery, denies complications - Patient has urinary incontinence? No  - Patient is safe to resume physical and sexual activity  6.   Health Maintenance - Last pap smear done 06/2018 and was abnormal with NILM/+HPV with negative HPV. - Discussed with patient needs office visit ASAP for repeat Pap as should have been repeated 06/2019 - will be seen in office for evaluation of bleeding, but also advised to present to ED/Urgent Care for sooner evaluation with new or worsening symptoms  7. Chronic Disease - PCP follow up, list given  12 minutes of non-face-to-face time spent with the patient    Clarisa Fling, NP [facility] [place]

## 2020-05-30 NOTE — Patient Instructions (Signed)
Etonogestrel implant What is this medicine? ETONOGESTREL (et oh noe JES trel) is a contraceptive (birth control) device. It is used to prevent pregnancy. It can be used for up to 3 years. This medicine may be used for other purposes; ask your health care provider or pharmacist if you have questions. COMMON BRAND NAME(S): Implanon, Nexplanon What should I tell my health care provider before I take this medicine? They need to know if you have any of these conditions:  abnormal vaginal bleeding  blood vessel disease or blood clots  breast, cervical, endometrial, ovarian, liver, or uterine cancer  diabetes  gallbladder disease  heart disease or recent heart attack  high blood pressure  high cholesterol or triglycerides  kidney disease  liver disease  migraine headaches  seizures  stroke  tobacco smoker  an unusual or allergic reaction to etonogestrel, anesthetics or antiseptics, other medicines, foods, dyes, or preservatives  pregnant or trying to get pregnant  breast-feeding How should I use this medicine? This device is inserted just under the skin on the inner side of your upper arm by a health care professional. Talk to your pediatrician regarding the use of this medicine in children. Special care may be needed. Overdosage: If you think you have taken too much of this medicine contact a poison control center or emergency room at once. NOTE: This medicine is only for you. Do not share this medicine with others. What if I miss a dose? This does not apply. What may interact with this medicine? Do not take this medicine with any of the following medications:  amprenavir  fosamprenavir This medicine may also interact with the following medications:  acitretin  aprepitant  armodafinil  bexarotene  bosentan  carbamazepine  certain medicines for fungal infections like fluconazole, ketoconazole, itraconazole and voriconazole  certain medicines to treat  hepatitis, HIV or AIDS  cyclosporine  felbamate  griseofulvin  lamotrigine  modafinil  oxcarbazepine  phenobarbital  phenytoin  primidone  rifabutin  rifampin  rifapentine  St. John's wort  topiramate This list may not describe all possible interactions. Give your health care provider a list of all the medicines, herbs, non-prescription drugs, or dietary supplements you use. Also tell them if you smoke, drink alcohol, or use illegal drugs. Some items may interact with your medicine. What should I watch for while using this medicine? This product does not protect you against HIV infection (AIDS) or other sexually transmitted diseases. You should be able to feel the implant by pressing your fingertips over the skin where it was inserted. Contact your doctor if you cannot feel the implant, and use a non-hormonal birth control method (such as condoms) until your doctor confirms that the implant is in place. Contact your doctor if you think that the implant may have broken or become bent while in your arm. You will receive a user card from your health care provider after the implant is inserted. The card is a record of the location of the implant in your upper arm and when it should be removed. Keep this card with your health records. What side effects may I notice from receiving this medicine? Side effects that you should report to your doctor or health care professional as soon as possible:  allergic reactions like skin rash, itching or hives, swelling of the face, lips, or tongue  breast lumps, breast tissue changes, or discharge  breathing problems  changes in emotions or moods  coughing up blood  if you feel that the implant   may have broken or bent while in your arm  high blood pressure  pain, irritation, swelling, or bruising at the insertion site  scar at site of insertion  signs of infection at the insertion site such as fever, and skin redness, pain or  discharge  signs and symptoms of a blood clot such as breathing problems; changes in vision; chest pain; severe, sudden headache; pain, swelling, warmth in the leg; trouble speaking; sudden numbness or weakness of the face, arm or leg  signs and symptoms of liver injury like dark yellow or brown urine; general ill feeling or flu-like symptoms; light-colored stools; loss of appetite; nausea; right upper belly pain; unusually weak or tired; yellowing of the eyes or skin  unusual vaginal bleeding, discharge Side effects that usually do not require medical attention (report to your doctor or health care professional if they continue or are bothersome):  acne  breast pain or tenderness  headache  irregular menstrual bleeding  nausea This list may not describe all possible side effects. Call your doctor for medical advice about side effects. You may report side effects to FDA at 1-800-FDA-1088. Where should I keep my medicine? This drug is given in a hospital or clinic and will not be stored at home. NOTE: This sheet is a summary. It may not cover all possible information. If you have questions about this medicine, talk to your doctor, pharmacist, or health care provider.  2020 Elsevier/Gold Standard (2019-09-13 11:33:04)      AREA FAMILY PRACTICE PHYSICIANS  Central/[place]outheast [place] [phone]) . [place] o [place] [place], [place] [address] o [phone] o Mon-Fri 8:30-12:30, 1:30-5:00 o Accepting Medicaid . [place] at [facility] [place], [place], [place] [address] o [phone] o Mon-Fri 8:00-5:30 . [place]., [place], [place] [address] o [phone], Tue, Thur, Fri 8:30-5:00, Wed 10:00-7:00 (closed 1-2pm) o Accepting Medicaid . [facility] o [address], [place], [place]  [address] o Phone - [phone]   Fax - [phone]  East/[place]ortheast  [place] [phone]) . [place]., [place], [place] [address] o [phone] o Mon-Fri 8:00-5:00 . [facility] at [facility])  o [place] [place], [place] [address] o [phone] o Mon-Fri 8:30-5:30, Sat (Oct.-Mar.) 9:00-1:00 o Accepting [facility] [phone]) . [place] at [place], [place], [place] [address] o [phone] o Mon-Fri 8:00-5:00  [place] [phone]) . [place] at [place], [place], [place] [address] o [phone] o Mon-Fri 8:00-5:00 . Therapist, music at [place], [place], [place] [address] o [phone] o Mon-Fri 8:00-5:00 . Therapist, music at [place]., [place], [place] [address] o [phone] o Mon-Fri 8:00-5:00 . [place]., [place] [address] o [phone] o Mon-Fri 7:30-5:30  [place] ([place]) . [place]., [place], [place] [address] o [phone] o Mon-Thur 8:00-6:00 o Accepting Medicaid . [place]., [place], [place] [address] o [phone] o Mon-Thur 7:30-7:30, Fri 7:30-4:30 o Accepting Medicaid . [place] at [place] [address] [place], [place]  [address] o [phone]   Fax - [place] [phone] & [phone]) . Therapist, music at  [place] [place], [place] [address] o [phone] o Mon-Fri 7:00-5:00 . [place], [place], [place] [address] o [phone] o Mon-Fri 8:00-5:00 o Accepting Medicaid . [place], [place], [place]  [address] o [phone] o Mon-Fri 8:00-5:00 o Accepting Medicaid  [facility] [place] [phone]) . [facility] at [facility] o [address] [place] [place], [place] [address] o [phone] o Mon-Fri 8:00-5:00 . [place] ([place] at [employer]) o [address] [place], [place], [place] [address] o [phone] o Mon-Fri 8:00-5:00 o Accepting Medicaid . [place] ([facility] [employer]) o [address] [place], [place], [place] [address] o [phone] o Mon-Fri 8:00-5:30, Sat&Sun by appointment (phones open at 8:30) o Accepting [facility] [phone] & [phone]) . [facility] o [address] [place], [place] [address] o [phone] o Mon-Thur 8:00-7:00, Fri 8:00-5:00, Sat 8:00-12:00, Sun 9:00-12:00 o Accepting Medicaid . [facility] at [facility] [address], [place], [place] [address] o [phone] o Mon-Thur 8:00-5:00 o Accepting Medicaid . [facility] at [place]., [place], [place] [address] o [phone] o Mon-Fri 8:00-5:30, Sat (Oct.-Mar.) 9:00-1:00 o Accepting [employer] [phone]) . [place] o [address] [place], [place], [place] [address] o [phone] o Mon-Fri 8:00-5:00 o Accepting Medicaid   [facility] [phone]) . [place] at [place], [place], [place] [address] o [phone] o Mon-Fri 8:00-5:00 . Therapist, music at [facility] o [address], [place], [place] [address] o [phone] o Mon-Fri 8:00-5:00 . [place] [address], [place], [place] [address] o [phone] o Mon-Fri 8:00-5:00 o After hours clinic [facility] [place], [place] [address]) [phone] Mon-Fri 5:00-8:00, Sat 12:00-6:00, Sun  10:00-4:00 o Accepting Medicaid . [place] at [facility] o [address], [place], [place]  [address] o [phone]   Fax - [phone]  [place] [phone]) . Therapist, music at [place] o [address] [place] [address] [place], [place] [address] o [phone] o Mon-Fri 8:00-5:00 . [facility] [place]) o [place] [address], [place],  [address] o [phone] o Mon-Thur 8:00-7:00, Fri 8:00-5:00, Sat 8:00-12:00

## 2020-06-14 ENCOUNTER — Ambulatory Visit: Payer: Medicare Other | Admitting: Family Medicine

## 2020-06-14 ENCOUNTER — Telehealth: Payer: Self-pay | Admitting: Family Medicine

## 2020-06-14 NOTE — Telephone Encounter (Signed)
Received a call from the patient stating she would not be able to make her appointment today. I informed her the next available appointment would not be until August. She stated that would be fine.

## 2020-07-26 ENCOUNTER — Encounter: Payer: Self-pay | Admitting: Family Medicine

## 2020-07-26 ENCOUNTER — Ambulatory Visit (INDEPENDENT_AMBULATORY_CARE_PROVIDER_SITE_OTHER): Payer: Medicare Other | Admitting: Family Medicine

## 2020-07-26 ENCOUNTER — Other Ambulatory Visit (HOSPITAL_COMMUNITY)
Admission: RE | Admit: 2020-07-26 | Discharge: 2020-07-26 | Disposition: A | Discharge status: Home / Self Care | Payer: Medicare Other | Source: Ambulatory Visit | Attending: Family Medicine | Admitting: Family Medicine

## 2020-07-26 ENCOUNTER — Other Ambulatory Visit: Payer: Self-pay

## 2020-07-26 VITALS — BP 128/71 | HR 70 | Ht 63.0 in | Wt 239.0 lb

## 2020-07-26 DIAGNOSIS — Z30011 Encounter for initial prescription of contraceptive pills: Secondary | ICD-10-CM | POA: Diagnosis not present

## 2020-07-26 DIAGNOSIS — Z1151 Encounter for screening for human papillomavirus (HPV): Secondary | ICD-10-CM | POA: Insufficient documentation

## 2020-07-26 DIAGNOSIS — Z124 Encounter for screening for malignant neoplasm of cervix: Secondary | ICD-10-CM | POA: Diagnosis not present

## 2020-07-26 DIAGNOSIS — Z3046 Encounter for surveillance of implantable subdermal contraceptive: Secondary | ICD-10-CM

## 2020-07-26 MED ORDER — NORGESTIMATE-ETH ESTRADIOL 0.25-35 MG-MCG PO TABS: 1.0000 | ORAL_TABLET | Freq: Every day | ORAL | 13 refills | Status: DC

## 2020-07-26 NOTE — Progress Notes (Signed)
Contraception/Family Planning VISIT ENCOUNTER NOTE  Subjective:   [name] is a 34 y.o. [phone] female here for reproductive life counseling.  Desires "something short term" but also decreased HA and SE profile from [facility].  She reports irregular cycles with nexplanon and HA everyday. Reports she does not want a pregnancy in the next year. Denies abnormal vaginal bleeding, discharge, pelvic pain, problems with intercourse or other gynecologic concerns.    Gynecologic History No LMP recorded (lmp unknown). (Menstrual status: Irregular Periods). Contraception: Nexplanon  Health Maintenance Due  Topic Date Due  . Hepatitis C Screening  Never done  . COVID-19 Vaccine (1) Never done  . INFLUENZA VACCINE  07/15/2020   The following portions of the patient's history were reviewed and updated as appropriate: allergies, current medications, past family history, past medical history, past social history, past surgical history and problem list.  Review of Systems Pertinent items are noted in HPI.   Objective:  BP 128/71   Pulse 70   Ht 5\' 3"  (1.6 m)   Wt 239 lb (108.4 kg)   LMP  (LMP Unknown)   Breastfeeding No   BMI 42.34 kg/m  Gen: well appearing, NAD HEENT: no scleral icterus CV: RR Lung: Normal WOB Ext: warm well perfused  PELVIC: Normal appearing external genitalia; normal appearing vaginal mucosa and cervix.  No abnormal discharge noted.  Pap smear obtained.  Normal uterine size, no other palpable masses, no uterine or adnexal tenderness.   Nexplanon Removal Patient identified, informed consent performed, consent signed.   Appropriate time out taken. Nexplanon site identified.  Area prepped in usual sterile fashon. One ml of 1% lidocaine was used to anesthetize the area at the distal end of the implant. A small stab incision was made right beside the implant on the distal portion.  The Nexplanon rod was grasped using hemostats and removed without difficulty.  There was  minimal blood loss. There were no complications.  3 ml of 1% lidocaine was injected around the incision for post-procedure analgesia.  Steri-strips were applied over the small incision.  A pressure bandage was applied to reduce any bruising.  The patient tolerated the procedure well and was given post procedure instructions.    Assessment and Plan:   Contraception counseling: Reviewed all forms of birth control options in the tiered based approach. available including abstinence; over the counter/barrier methods; hormonal contraceptive medication including pill, patch, ring, injection,contraceptive implant, ECP; hormonal and nonhormonal IUDs; permanent sterilization options including vasectomy and the various tubal sterilization modalities. Risks, benefits, and typical effectiveness rates were reviewed.  Questions were answered.  Written information was also given to the patient to review.  Patient desires OCP, this was prescribed for patient. She will follow up in  1 yr for surveillance.  She was told to call with any further questions, or with any concerns about this method of contraception.  Emphasized use of condoms 100% of the time for STI prevention.  1. Encounter for initial prescription of contraceptive pills - norgestimate-ethinyl estradiol (ORTHO-CYCLEN) 0.25-35 MG-MCG tablet; Take 1 tablet by mouth daily.  Dispense: 28 tablet; Refill: 13 - given handout on IUD - Discussed backing up OCP with condoms for 2-4 weeks and the need to start OCP immediately   2. Nexplanon removal Removed easily  3. Abnormal pap- NIL with HPV positive -repeat pap today  Please refer to After Visit Summary for other counseling recommendations.   Return in about 1 year (around 07/26/2021) for Yearly wellness exam.  Joelene Millin  Edwin Cap, MD, MPH, ABFM, [facility] Attending Physician [facility]

## 2020-07-30 LAB — CYTOLOGY - PAP
Comment: NEGATIVE
Diagnosis: NEGATIVE
High risk HPV: NEGATIVE

## 2020-08-13 DIAGNOSIS — Z20822 Contact with and (suspected) exposure to covid-19: Secondary | ICD-10-CM | POA: Diagnosis not present

## 2020-08-13 DIAGNOSIS — Z03818 Encounter for observation for suspected exposure to other biological agents ruled out: Secondary | ICD-10-CM | POA: Diagnosis not present

## 2020-09-13 ENCOUNTER — Other Ambulatory Visit: Payer: Self-pay

## 2020-09-13 ENCOUNTER — Encounter (HOSPITAL_COMMUNITY): Payer: Self-pay | Admitting: Obstetrics and Gynecology

## 2020-09-13 ENCOUNTER — Inpatient Hospital Stay (HOSPITAL_COMMUNITY)
Admission: AD | Admit: 2020-09-13 | Discharge: 2020-09-13 | Disposition: A | Discharge status: Home / Self Care | Payer: Medicare Other | Attending: Obstetrics and Gynecology | Admitting: Obstetrics and Gynecology

## 2020-09-13 DIAGNOSIS — N939 Abnormal uterine and vaginal bleeding, unspecified: Secondary | ICD-10-CM

## 2020-09-13 DIAGNOSIS — Z3202 Encounter for pregnancy test, result negative: Secondary | ICD-10-CM | POA: Insufficient documentation

## 2020-09-13 DIAGNOSIS — R109 Unspecified abdominal pain: Secondary | ICD-10-CM

## 2020-09-13 DIAGNOSIS — O209 Hemorrhage in early pregnancy, unspecified: Secondary | ICD-10-CM | POA: Diagnosis present

## 2020-09-13 LAB — CBC
HCT: 40.4 % (ref 36.0–46.0)
Hemoglobin: 12.6 g/dL (ref 12.0–15.0)
MCH: 28.7 pg (ref 26.0–34.0)
MCHC: 31.2 g/dL (ref 30.0–36.0)
MCV: 92 fL (ref 80.0–100.0)
Platelets: 171 10*3/uL (ref 150–400)
RBC: 4.39 MIL/uL (ref 3.87–5.11)
RDW: 14.1 % (ref 11.5–15.5)
WBC: 5.4 10*3/uL (ref 4.0–10.5)
nRBC: 0 % (ref 0.0–0.2)

## 2020-09-13 LAB — HCG, QUANTITATIVE, PREGNANCY: hCG, Beta Chain, Quant, S: <1 m[IU]/mL (ref <5)

## 2020-09-13 LAB — POCT PREGNANCY, URINE: Preg Test, Ur: NEGATIVE

## 2020-09-13 NOTE — MAU Provider Note (Signed)
First Provider Initiated Contact with Patient 09/13/20 1502      S [name] is a 34 y.o. [phone] non-pregnant female who presents to MAU today with complaint of abdominal cramping and vaginal bleeding in setting of positive home pregnancy test. She states her abdominal pain is mild and "feels like ovulation". Her bleeding is scant and she has not needed to don a pad.   Patient endorses LMP of 08/19/2020. She is s/p Nexplanon removal on 07/26/2020  O BP (!) 137/95   Pulse 95   Temp 99.1 F (37.3 C)   Resp 16   LMP 08/19/2020   SpO2 100%    Physical Exam Vitals and nursing note reviewed. Exam conducted with a chaperone present.  Constitutional:      Appearance: Normal appearance.  Cardiovascular:     Rate and Rhythm: Normal rate.     Pulses: Normal pulses.  Pulmonary:     Effort: Pulmonary effort is normal.  Skin:    Capillary Refill: Capillary refill takes less than 2 seconds.  Neurological:     General: No focal deficit present.     Mental Status: She is alert.     A Medical screening exam complete Negative urine pregnancy test Quant hCG <1 Patient notified of Quant hCG result via phone call per her request  P Discharge from MAU in stable condition Patient given the option of transfer to [facility] for further evaluation or seek care in outpatient facility of choice List of options for follow-up given  Warning signs for worsening condition that would warrant emergency follow-up discussed Patient may return to MAU as needed for pregnancy related complaints  Mallie Snooks, CNM 09/13/2020 4:38 PM

## 2020-09-13 NOTE — MAU Note (Signed)
Pt presents to MAU with complaints of vaginal bleeding and possible . Denies any pain

## 2020-09-14 ENCOUNTER — Encounter: Payer: Self-pay | Admitting: Obstetrics and Gynecology

## 2020-10-09 ENCOUNTER — Inpatient Hospital Stay (HOSPITAL_COMMUNITY)
Admission: AD | Admit: 2020-10-09 | Discharge: 2020-10-09 | Disposition: A | Discharge status: Home / Self Care | Payer: Medicare Other | Attending: Obstetrics & Gynecology | Admitting: Obstetrics & Gynecology

## 2020-10-09 ENCOUNTER — Other Ambulatory Visit: Payer: Self-pay

## 2020-10-09 DIAGNOSIS — R109 Unspecified abdominal pain: Secondary | ICD-10-CM | POA: Diagnosis present

## 2020-10-09 DIAGNOSIS — Z3202 Encounter for pregnancy test, result negative: Secondary | ICD-10-CM

## 2020-10-09 DIAGNOSIS — N939 Abnormal uterine and vaginal bleeding, unspecified: Secondary | ICD-10-CM

## 2020-10-09 LAB — HCG, QUANTITATIVE, PREGNANCY: hCG, Beta Chain, Quant, S: 13 m[IU]/mL — ABNORMAL HIGH (ref <5)

## 2020-10-09 LAB — POCT PREGNANCY, URINE: Preg Test, Ur: NEGATIVE

## 2020-10-09 NOTE — Discharge Instructions (Signed)
Human Chorionic Gonadotropin Test Why am I having this test? A human chorionic gonadotropin (hCG) test is done to determine whether you are pregnant. It can also be used:  To diagnose an abnormal pregnancy.  To determine whether you have had a failed pregnancy (miscarriage) or are at risk of one. What is being tested? This test checks the level of the human chorionic gonadotropin (hCG) hormone in the blood. This hormone is produced during pregnancy by the cells that form the placenta. The placenta is the organ that grows inside your womb (uterus) to nourish a developing baby. When you are pregnant, hCG can be detected in your blood or urine 7 to 8 days before your missed period. It continues to go up for the first 8-10 weeks of pregnancy. The presence of hCG in your blood can be measured with several different types of tests. You may have:  A urine test. ? Because this hormone is eliminated from your body by your kidneys, you may have a urine test to find out whether you are pregnant. A home pregnancy test detects whether there is hCG in your urine. ? A urine test only shows whether there is hCG in your urine. It does not measure how much.  A qualitative blood test. ? You may have this type of blood test to find out if you are pregnant. ? This blood test only shows whether there is hCG in your blood. It does not measure how much.  A quantitative blood test. ? This type of blood test measures the amount of hCG in your blood. ? You may have this test to:  Diagnose an abnormal pregnancy.  Check whether you have had a miscarriage.  Determine whether you are at risk of a miscarriage. What kind of sample is taken?     Two kinds of samples may be collected to test for the hCG hormone.  Blood. It is usually collected by inserting a needle into a blood vessel.  Urine. It is usually collected by urinating into a germ-free (sterile) specimen cup. It is best to collect the sample the first  time you urinate in the morning. How do I prepare for this test? No preparation is needed for a blood test.  For the urine test:  Let your health care provider know about: ? All medicines you are taking, including vitamins, herbs, creams, and over-the-counter medicines. ? Any blood in your urine. This may interfere with the result.  Do not drink too much fluid. Drink as you normally would, or as directed by your health care provider. How are the results reported? Depending on the type of test that you have, your test results may be reported as values. Your health care provider will compare your results to normal ranges that were established after testing a large group of people (reference ranges). Reference ranges may vary among labs and hospitals. For this test, common reference ranges that show absence of pregnancy are:  Quantitative hCG blood levels: less than 5 IU/L. Other results will be reported as either positive or negative. For this test, normal results (meaning the absence of pregnancy) are:  Negative for hCG in the urine test.  Negative for hCG in the qualitative blood test. What do the results mean? Urine and qualitative blood test  A negative result could mean: ? That you are not pregnant. ? That the test was done too early in your pregnancy to detect hCG in your blood or urine. If you still have other signs  of pregnancy, the test will be repeated.  A positive result means: ? That you are most likely pregnant. Your health care provider may confirm your pregnancy with an imaging study (ultrasound) of your uterus, if needed. Quantitative blood test Results of the quantitative hCG blood test will be interpreted as follows:  Less than 5 IU/L: You are most likely not pregnant.  Greater than 25 IU/L: You are most likely pregnant.  hCG levels that are higher than expected: ? You are pregnant with twins. ? You have abnormal growths in the uterus.  hCG levels that are  rising more slowly than expected: ? You have an ectopic pregnancy (also called a tubal pregnancy).  hCG levels that are falling: ? You may be having a miscarriage. Talk with your health care provider about what your results mean. Questions to ask your health care provider Ask your health care provider, or the department that is doing the test:  When will my results be ready?  How will I get my results?  What are my treatment options?  What other tests do I need?  What are my next steps? Summary  A human chorionic gonadotropin test is done to determine whether you are pregnant.  When you are pregnant, hCG can be detected in your blood or urine 7 to 8 days before your missed period. It continues to go up for the first 8-10 weeks of pregnancy.  Your hCG level can be measured with different types of tests. You may have a urine test, a qualitative blood test, or a quantitative blood test.  Talk with your health care provider about what your results mean. This information is not intended to replace advice given to you by your health care provider. Make sure you discuss any questions you have with your health care provider. Document Revised: 11/02/2017 Document Reviewed: 11/02/2017 Elsevier Patient Education  2020 [employer].

## 2020-10-09 NOTE — MAU Provider Note (Signed)
None    S [name] is a 34 y.o. [id number] patient who presents to MAU today with complaint recurrent cramping and vaginal spotting. These are recurrent problems, onset about one month ago. Patient endorses LMP of 09/09/2020. She has not missed a period but had a positive home pregnancy test.  O BP 127/80   Pulse 70   Temp 98.7 F (37.1 C)   Resp 18   Ht 5\' 3"  (1.6 m)   Wt 108.4 kg   LMP 09/18/2020   BMI 42.34 kg/m    Physical Exam Vitals and nursing note reviewed. Exam conducted with a chaperone present.  Constitutional:      Appearance: She is well-developed.  Cardiovascular:     Rate and Rhythm: Normal rate.  Pulmonary:     Effort: Pulmonary effort is normal.  Abdominal:     Palpations: Abdomen is soft.     Tenderness: There is no abdominal tenderness.  Neurological:     Mental Status: She is alert.     A Medical screening exam complete Negative urine pregnancy test Quant hCG collected, patient discharged home per MAU policy Quant hCG 13 Patient called with results at [place].  Signs of worsening acuity/indications for return to MAU reviewed.  Advised to have stat Quant Fri morning in office  P Discharge from MAU in stable condition   F/U: --Pt scheduled for stat Quant hCG Friday morning at [place], CNM 10/09/2020 7:30 PM

## 2020-10-09 NOTE — MAU Note (Signed)
Pt had positive HPT 3 days ago. Reports some cramping and spotting today.

## 2020-10-10 NOTE — MAU Note (Signed)
BHCG level drawn. S.Weinhold,CNM talked with pt and told her she would call with results. Pt discharged home.

## 2020-10-12 ENCOUNTER — Ambulatory Visit (INDEPENDENT_AMBULATORY_CARE_PROVIDER_SITE_OTHER): Payer: Medicare Other

## 2020-10-12 ENCOUNTER — Other Ambulatory Visit: Payer: Self-pay

## 2020-10-12 VITALS — BP 122/64 | HR 80 | Wt 239.4 lb

## 2020-10-12 DIAGNOSIS — O3680X0 Pregnancy with inconclusive fetal viability, not applicable or unspecified: Secondary | ICD-10-CM | POA: Diagnosis not present

## 2020-10-12 LAB — BETA HCG QUANT (REF LAB): hCG Quant: 56 m[IU]/mL

## 2020-10-12 NOTE — Progress Notes (Signed)
Pt here today for STAT Beta Lab s/p elevated beta on 10/09/20 in MAU with  level of 13.  Pt denies any vaginal bleeding or pain.  Pt advised that I will call her with results and f/u around noon time.  Pt verbalized understanding.  Received results of STAT Beta of 56.  Reviewed results with Dr. Harolyn Rutherford who recommends that patient's beta has risen appropriately, due to patient being early in pregnancy to schedule an U/S for viability in 3-4 weeks, and to provide ectopic precautions.     U/S scheduled for November 29th @ 1445.  Pt notified pt providers recommendation and U/S appt.  Pt asks how far along should she be?  Pt informed me that her LMP is 09/18/20.  I advised the pt that on the day of U/S appt she should be 7w 6d.  I advised pt that we hope that she would be far enough along that the U/S will be able to get everything.  I also advised pt that if she starts bleeding like a period or she starts to have intense to go to MAU.  Pt verbalized understanding.   Mel Almond, RN  10/12/20

## 2020-10-14 NOTE — Progress Notes (Signed)
Patient was assessed and managed by nursing staff during this encounter. I have reviewed the chart and agree with the documentation and plan. I have also made any necessary editorial changes.  Verita Schneiders, MD 10/14/2020 7:30 AM

## 2020-10-22 ENCOUNTER — Telehealth: Payer: Self-pay | Admitting: Family Medicine

## 2020-10-22 NOTE — Telephone Encounter (Signed)
Patient calling about her [place], want to know if someone will give her the results that day, state US told her that someone will call her with results.

## 2020-10-22 NOTE — Telephone Encounter (Signed)
Attempted to contact x 2 and received a message stating that "your call could not be completed at this time, please try your call again later."  Mel Almond, RN

## 2020-11-09 ENCOUNTER — Inpatient Hospital Stay (HOSPITAL_COMMUNITY)
Admission: AD | Admit: 2020-11-09 | Discharge: 2020-11-09 | Disposition: A | Discharge status: Home / Self Care | Payer: Medicare Other | Attending: Family Medicine | Admitting: Family Medicine

## 2020-11-09 ENCOUNTER — Encounter (HOSPITAL_COMMUNITY): Payer: Self-pay | Admitting: Family Medicine

## 2020-11-09 ENCOUNTER — Other Ambulatory Visit: Payer: Self-pay

## 2020-11-09 ENCOUNTER — Inpatient Hospital Stay (HOSPITAL_COMMUNITY): Payer: Medicare Other

## 2020-11-09 DIAGNOSIS — O3481 Maternal care for other abnormalities of pelvic organs, first trimester: Secondary | ICD-10-CM | POA: Diagnosis not present

## 2020-11-09 DIAGNOSIS — R1031 Right lower quadrant pain: Secondary | ICD-10-CM | POA: Insufficient documentation

## 2020-11-09 DIAGNOSIS — O208 Other hemorrhage in early pregnancy: Secondary | ICD-10-CM | POA: Diagnosis not present

## 2020-11-09 DIAGNOSIS — N831 Corpus luteum cyst of ovary, unspecified side: Secondary | ICD-10-CM | POA: Insufficient documentation

## 2020-11-09 DIAGNOSIS — O26891 Other specified pregnancy related conditions, first trimester: Secondary | ICD-10-CM | POA: Diagnosis not present

## 2020-11-09 DIAGNOSIS — R109 Unspecified abdominal pain: Secondary | ICD-10-CM

## 2020-11-09 DIAGNOSIS — O26893 Other specified pregnancy related conditions, third trimester: Secondary | ICD-10-CM | POA: Diagnosis not present

## 2020-11-09 DIAGNOSIS — O368310 Maternal care for abnormalities of the fetal heart rate or rhythm, first trimester, not applicable or unspecified: Secondary | ICD-10-CM | POA: Insufficient documentation

## 2020-11-09 DIAGNOSIS — Z3A08 8 weeks gestation of pregnancy: Secondary | ICD-10-CM | POA: Diagnosis not present

## 2020-11-09 DIAGNOSIS — O3680X0 Pregnancy with inconclusive fetal viability, not applicable or unspecified: Secondary | ICD-10-CM

## 2020-11-09 DIAGNOSIS — O468X1 Other antepartum hemorrhage, first trimester: Secondary | ICD-10-CM

## 2020-11-09 DIAGNOSIS — Z3490 Encounter for supervision of normal pregnancy, unspecified, unspecified trimester: Secondary | ICD-10-CM

## 2020-11-09 LAB — CBC
HCT: 40.2 % (ref 36.0–46.0)
Hemoglobin: 12.8 g/dL (ref 12.0–15.0)
MCH: 29.3 pg (ref 26.0–34.0)
MCHC: 31.8 g/dL (ref 30.0–36.0)
MCV: 92 fL (ref 80.0–100.0)
Platelets: 170 10*3/uL (ref 150–400)
RBC: 4.37 MIL/uL (ref 3.87–5.11)
RDW: 13.6 % (ref 11.5–15.5)
WBC: 4.7 10*3/uL (ref 4.0–10.5)
nRBC: 0 % (ref 0.0–0.2)

## 2020-11-09 LAB — URINALYSIS, ROUTINE W REFLEX MICROSCOPIC
Bilirubin Urine: NEGATIVE
Glucose, UA: NEGATIVE mg/dL
Ketones, ur: NEGATIVE mg/dL
Nitrite: NEGATIVE
Protein, ur: 30 mg/dL — AB
Specific Gravity, Urine: 1.021 (ref 1.005–1.030)
pH: 6 (ref 5.0–8.0)

## 2020-11-09 LAB — HCG, QUANTITATIVE, PREGNANCY: hCG, Beta Chain, Quant, S: 61153 m[IU]/mL — ABNORMAL HIGH (ref <5)

## 2020-11-09 LAB — ABO/RH: ABO/RH(D): O POS

## 2020-11-09 NOTE — MAU Provider Note (Signed)
Chief Complaint:  Abdominal Pain   First Provider Initiated Contact with Patient 11/09/20 1118     HPI: [name] is a 34 y.o. [id number] at [redacted]w[redacted]d who presents to maternity admissions reporting lower right abdominal pain that is mild now but has been sharper over the past two days. She also reports spotting but none since last night. Reports an early ultrasound at [facility] that showed a gestational sac at 5wks, but is concerned about an ectopic pregnancy.   Past Medical History:  Diagnosis Date  . Anxiety   . Asthma    inhaler PRN-hasn't used inhaler in months  . Attention deficit hyperactivity disorder   . Bipolar disorder [facility])    hospitalized as a teen for suicidal ideations and cutting  . Depression    no complaints, doing well  . Gastritis   . GERD (gastroesophageal reflux disease)   . History of gestational diabetes in prior pregnancy, currently pregnant 10/13/2017  . History of preterm delivery after IOL for preeclampsia, currently pregnant 10/13/2017   Not a 17P candidate  . Hx of varicella   . Mild preeclampsia 02/09/2016  . Vaginal Pap smear, abnormal    OB History  Gravida Para Term Preterm AB Living  7 5 4 1 1 5   SAB TAB Ectopic Multiple Live Births  1     0 5    # Outcome Date GA Lbr Len/2nd Weight Sex Delivery Anes PTL Lv  7 Current           6 Term 03/16/20 [redacted]w[redacted]d / 00:14 7 lb 2.1 oz (3.235 kg) F Vag-Spont EPI  LIV  5 SAB 05/2019          4 Term 12/17/18 [redacted]w[redacted]d  6 lb (2.722 kg) F Vag-Spont   LIV     Birth Comments: Hypertension  3 Term 02/19/18 [redacted]w[redacted]d 946:02 / 01:46 8 lb 5.7 oz (3.79 kg) M Vag-Spont EPI  LIV     Birth Comments: hypertension  2 Preterm 02/03/17 [redacted]w[redacted]d  7 lb 7.9 oz (3.4 kg) M Vag-Spont EPI  LIV     Birth Comments: hypertension  1 Term 02/11/16 [redacted]w[redacted]d / 01:14 8 lb 2.6 oz (3.702 kg) F Vag-Spont EPI  LIV     Birth Comments: preeclampsia   Past Surgical History:  Procedure Laterality Date  . EYE MUSCLE SURGERY Bilateral     07/29/12  . MOUTH SURGERY    . PLANTAR FASCIA SURGERY    . WISDOM TOOTH EXTRACTION     Family History  Problem Relation Age of Onset  . Depression Mother   . Colon cancer Mother   . Colon polyps Mother   . Depression Father   . Lung cancer Father   . Depression Brother   . Liver disease Other   . Kidney disease Other    Social History   Tobacco Use  . Smoking status: Never Smoker  . Smokeless tobacco: Never Used  Vaping Use  . Vaping Use: Never used  Substance Use Topics  . Alcohol use: No    Alcohol/week: 0.0 standard drinks  . Drug use: No   Allergies  Allergen Reactions  . Depakote [Divalproex Sodium] Other (See Comments)    Pt states that this medication makes her blood levels toxic.    [name] Methylphenidate Derivatives Other (See Comments)    Reaction:  Depression and anger   . Neurontin [Gabapentin] Other (See Comments)    Reaction:  Dizziness   . Prozac [Fluoxetine Hcl] Other (  See Comments)    Reaction:  Anger   . Methylphenidate Hcl     SAME AS PREVIOUS LISTED METHYLPHENIDATE   No medications prior to admission.   I have reviewed patient's Past Medical Hx, Surgical Hx, Family Hx, Social Hx, medications and allergies.   ROS:  Review of Systems  HENT: Negative for congestion and sore throat.   Eyes: Negative for visual disturbance.  Respiratory: Negative for cough and shortness of breath.   Cardiovascular: Negative for chest pain.  Gastrointestinal: Positive for abdominal pain. Negative for nausea and vomiting.  Genitourinary: Positive for vaginal bleeding.  Neurological: Negative for syncope and headaches.  All other systems reviewed and are negative.  Physical Exam   Patient Vitals for the past 24 hrs:  BP Temp Pulse Resp Height Weight  11/09/20 1335 122/86 -- 98 17 -- --  11/09/20 1043 120/78 98.8 F (37.1 C) -- 18 5\' 3"  (1.6 m) 240 lb (108.9 kg)   Constitutional: Well-developed, well-nourished female in no acute distress.  Cardiovascular: normal  rate & rhythm, no murmur Respiratory: normal effort, lung sounds clear throughout GI: Abd soft, non-tender, gravid appropriate for gestational age. Pos BS x 4 MS: Extremities nontender, no edema, normal ROM Neurologic: Alert and oriented x 4.  Pelvic exam deferred, no current bleeding or discharge   Labs: Results for orders placed or performed during the hospital encounter of 11/09/20 (from the past 24 hour(s))  Urinalysis, Routine w reflex microscopic Urine, Clean Catch     Status: Abnormal   Collection Time: 11/09/20 11:00 AM  Result Value Ref Range   Color, Urine YELLOW YELLOW   APPearance CLOUDY (A) CLEAR   Specific Gravity, Urine 1.021 1.005 - 1.030   pH 6.0 5.0 - 8.0   Glucose, UA NEGATIVE NEGATIVE mg/dL   Hgb urine dipstick SMALL (A) NEGATIVE   Bilirubin Urine NEGATIVE NEGATIVE   Ketones, ur NEGATIVE NEGATIVE mg/dL   Protein, ur 30 (A) NEGATIVE mg/dL   Nitrite NEGATIVE NEGATIVE   Leukocytes,Ua MODERATE (A) NEGATIVE   RBC / HPF 0-5 0 - 5 RBC/hpf   WBC, UA 0-5 0 - 5 WBC/hpf   Bacteria, UA RARE (A) NONE SEEN   Squamous Epithelial / LPF 11-20 0 - 5   Mucus PRESENT   CBC     Status: None   Collection Time: 11/09/20 11:37 AM  Result Value Ref Range   WBC 4.7 4.0 - 10.5 K/uL   RBC 4.37 3.87 - 5.11 MIL/uL   Hemoglobin 12.8 12.0 - 15.0 g/dL   HCT 40.2 36 - 46 %   MCV 92.0 80.0 - 100.0 fL   MCH 29.3 26.0 - 34.0 pg   MCHC 31.8 30.0 - 36.0 g/dL   RDW 13.6 11.5 - 15.5 %   Platelets 170 150 - 400 K/uL   nRBC 0.0 0.0 - 0.2 %  ABO/Rh     Status: None   Collection Time: 11/09/20 11:37 AM  Result Value Ref Range   ABO/RH(D) O POS    No rh immune globuloin      NOT A RH IMMUNE GLOBULIN CANDIDATE, PT RH POSITIVE Performed at [facility], [address], [place], [place] [address]   hCG, quantitative, pregnancy     Status: Abnormal   Collection Time: 11/09/20 11:37 AM  Result Value Ref Range   hCG, Beta Chain, Quant, S 61,153 (H) <5 mIU/mL    Imaging:  US OB LESS THAN  14 WEEKS WITH OB TRANSVAGINAL  Result Date: 11/09/2020  CLINICAL DATA:  Pelvic pain. EXAM: OBSTETRIC <14 WK [place] AND TRANSVAGINAL OB US TECHNIQUE: Both transabdominal and transvaginal ultrasound examinations were performed for complete evaluation of the gestation as well as the maternal uterus, adnexal regions, and pelvic cul-de-sac. Transvaginal technique was performed to assess early pregnancy. COMPARISON:  Ultrasound 02/20/2020. FINDINGS: Intrauterine gestational sac: Single Yolk sac:  Present Embryo:  Present Cardiac Activity: Not visualized MSD: 17.4 mm   6 w   4 d CRL:  5.5 mm   6 w   1 d                  [place] EDC: 07/04/2020 Subchorionic hemorrhage:  Small subchorionic hemorrhage noted. Maternal uterus/adnexae: Small corpus luteal cyst cannot be excluded. IMPRESSION: 1. Single intrauterine gestational sac, yolk sac, and embryo corresponding to gestational age of [redacted] weeks 1 day. Fetal cardiac activity not visualized. Recommend follow-up quantitative B-HCG levels and follow-up US in 14 days to assess viability. This recommendation follows SRU consensus guidelines: Diagnostic Criteria for Nonviable Pregnancy Early in the First Trimester. [place] Med 2013; 979:8921-19. 2.  Small subchorionic hemorrhage noted. Electronically Signed   By: Marcello Moores  Register   On: 11/09/2020 13:12    MAU Course: Orders Placed This Encounter  Procedures  . US OB LESS THAN 14 WEEKS WITH OB TRANSVAGINAL  . Urinalysis, Routine w reflex microscopic Urine, Clean Catch  . CBC  . hCG, quantitative, pregnancy  . ABO/Rh  . Discharge patient   MDM: Hcg up to 61k+ from 56 at her last MAU visit. Small subchorionic hematoma noted on U/S with IUGS/YS/embryo but no cardiac activity. Gave results to patient and answered questions  Assessment: 1. Right lower quadrant abdominal pain   2. Abdominal pain during pregnancy in first trimester   3. Intrauterine pregnancy   4. Corpus luteum cyst    Plan: Discharge home in stable  condition with bleeding precautions.  Message sent to [facility] for scheduling of F/U ultrasound in 14 days.   Follow-up [place] [facility]. Go to.   Specialty: Obstetrics and Gynecology Why: as scheduled for ongoing prenatal care, they will call to reschedule your follow up ultrasound Contact information: [address] [place] [address] [phone]              Allergies as of 11/09/2020      Reactions   Depakote [divalproex Sodium] Other (See Comments)   Pt states that this medication makes her blood levels toxic.     Methylphenidate Derivatives Other (See Comments)   Reaction:  Depression and anger    Neurontin [gabapentin] Other (See Comments)   Reaction:  Dizziness    Prozac [fluoxetine Hcl] Other (See Comments)   Reaction:  Anger    Methylphenidate Hcl    SAME AS PREVIOUS LISTED METHYLPHENIDATE      Medication List    TAKE these medications   chlorproMAZINE 25 MG tablet Commonly known as: THORAZINE Take 25 mg by mouth daily.   ZOLOFT PO Take 10 mg by mouth at bedtime.      Gaylan Gerold, CNM, MSN, [facility] 11/09/20 7:46 PM

## 2020-11-09 NOTE — MAU Note (Signed)
Pt reports lower right sided abd pain that started last night. Reports light spotting as well.

## 2020-11-09 NOTE — Discharge Instructions (Signed)

## 2020-11-12 ENCOUNTER — Telehealth: Payer: Self-pay | Admitting: Family Medicine

## 2020-11-12 ENCOUNTER — Ambulatory Visit: Admission: RE | Admit: 2020-11-12 | Payer: Medicare Other | Source: Ambulatory Visit

## 2020-11-12 NOTE — Telephone Encounter (Signed)
Received a call from patient yelling at me because she was told we would be calling her for an appointment. After looking in her chart I gave her the number to call ultrasound. I informed her she would be receiving a call, and she stated she wanted to call herself. She slammed the phone down.

## 2020-11-19 ENCOUNTER — Inpatient Hospital Stay (HOSPITAL_COMMUNITY)
Admission: AD | Admit: 2020-11-19 | Discharge: 2020-11-19 | Discharge status: Against Medical Advice | Payer: Medicare Other | Attending: Family Medicine | Admitting: Family Medicine

## 2020-11-19 ENCOUNTER — Other Ambulatory Visit: Payer: Self-pay

## 2020-11-19 NOTE — MAU Note (Signed)
Pt had child with her and was going to find childcare. Per registration pt left and will just wait until her appointment on the 13th. Providers on unit made aware.

## 2020-11-25 ENCOUNTER — Inpatient Hospital Stay (HOSPITAL_COMMUNITY): Payer: Medicare Other

## 2020-11-25 ENCOUNTER — Inpatient Hospital Stay (HOSPITAL_COMMUNITY)
Admission: AD | Admit: 2020-11-25 | Discharge: 2020-11-25 | Disposition: A | Discharge status: Home / Self Care | Payer: Medicare Other | Attending: Obstetrics and Gynecology | Admitting: Obstetrics and Gynecology

## 2020-11-25 ENCOUNTER — Other Ambulatory Visit: Payer: Self-pay

## 2020-11-25 DIAGNOSIS — R109 Unspecified abdominal pain: Secondary | ICD-10-CM | POA: Insufficient documentation

## 2020-11-25 DIAGNOSIS — O09211 Supervision of pregnancy with history of pre-term labor, first trimester: Secondary | ICD-10-CM | POA: Insufficient documentation

## 2020-11-25 DIAGNOSIS — N939 Abnormal uterine and vaginal bleeding, unspecified: Secondary | ICD-10-CM

## 2020-11-25 DIAGNOSIS — O039 Complete or unspecified spontaneous abortion without complication: Secondary | ICD-10-CM | POA: Insufficient documentation

## 2020-11-25 DIAGNOSIS — O26891 Other specified pregnancy related conditions, first trimester: Secondary | ICD-10-CM | POA: Insufficient documentation

## 2020-11-25 DIAGNOSIS — R110 Nausea: Secondary | ICD-10-CM | POA: Insufficient documentation

## 2020-11-25 DIAGNOSIS — Z3A11 11 weeks gestation of pregnancy: Secondary | ICD-10-CM | POA: Insufficient documentation

## 2020-11-25 MED ORDER — MISOPROSTOL 200 MCG PO TABS
800.0000 ug | ORAL_TABLET | Freq: Once | ORAL | Status: AC
  Administered 2020-11-25: 800 ug via BUCCAL
  Filled 2020-11-25: qty 4

## 2020-11-25 MED ORDER — MISOPROSTOL 200 MCG PO TABS: ORAL_TABLET | ORAL | 0 refills | Status: DC

## 2020-11-25 MED ORDER — PROMETHAZINE HCL 12.5 MG PO TABS: 12.5000 mg | ORAL_TABLET | Freq: Four times a day (QID) | ORAL | 0 refills | Status: DC | PRN

## 2020-11-25 MED ORDER — IBUPROFEN 800 MG PO TABS: 800.0000 mg | ORAL_TABLET | Freq: Three times a day (TID) | ORAL | 0 refills | Status: DC | PRN

## 2020-11-25 MED ORDER — ACETAMINOPHEN-CODEINE #3 300-30 MG PO TABS
1.0000 | ORAL_TABLET | Freq: Four times a day (QID) | ORAL | 0 refills | Status: DC | PRN
Start: 2020-11-25 — End: 2021-01-08

## 2020-11-25 NOTE — Discharge Instructions (Signed)
FACTS YOU SHOULD KNOW  WHAT IS AN EARLY PREGNANCY FAILURE? Once the egg is fertilized with the sperm and begins to develop, it attaches to the lining of the uterus. This early pregnancy tissue may not develop into an embryo (the beginning stage of a baby). Sometimes an embryo does develop but does not continue to grow. These problems can be seen on ultrasound.   MANAGEMNT OF EARLY PREGNANCY FAILURE: About 4 out of 100 (0.25%) women will have a pregnancy loss in her lifetime.  One in five pregnancies is found to be an early pregnancy failure.  There are 3 ways to care for an early pregnancy failure:   (1) Surgery, (2) Medicine, (3) Waiting for you to pass the pregnancy on your own. The decision as to how to proceed after being diagnosed with and early pregnancy failure is an individual one.  The decision can be made only after appropriate counseling.  You need to weigh the pros and cons of the 3 choices. Then you can make the choice that works for you. SURGERY (D&E) . Procedure over in 1 day . Requires being put to sleep . Bleeding may be light . Possible problems during surgery, including injury to womb(uterus) . Care provider has more control Medicine ([place]) . The complete procedure may take days to weeks . No Surgery . Bleeding may be heavy at times . There may be drug side effects . Patient has more control Waiting . You may choose to wait, in which case your own body may complete the passing of the abnormal early pregnancy on its own in about 2-4 weeks . Your bleeding may be heavy at times . There is a small possibility that you may need surgery if the bleeding is too much or not all of the pregnancy has passed. CYTOTEC MANAGEMENT Prostaglandins (cytotec) are the most widely used drug for this purpose. They cause the uterus to cramp and contract. You will place the medicine yourself inside your vagina in the privacy of your home. Empting of the uterus should occur within 3 days but  the process may continue for several weeks. The bleeding may seem heavy at times. POSSIBLE SIDE EFFECTS FROM CYTOTEC . Nausea   Vomiting . Diarrhea Fever . Chills  Hot Flashes Side effects  from the process of the early pregnancy failure include: . Cramping  Bleeding . Headaches  Dizziness RISKS: This is a low risk procedure. Less than 1 in 100 women has a complication. An incomplete passage of the early pregnancy may occur. Also, Hemorrhage (heavy bleeding) could happen.  Rarely the pregnancy will not be passed completely. Excessively heavy bleeding may occur.  Your doctor may need to perform surgery to empty the uterus (D&E). Afterwards: Everybody will feel differently after the early pregnancy completion. You may have soreness or cramps for a day or two. You may have soreness or cramps for day or two.  You may have light bleeding for up to 2 weeks. You may be as active as you feel like being. If you have any of the following problems you may call Maternity Admissions Unit at [phone]. . If you have pain that does not get better  with pain medication . Bleeding that soaks through 2 thick full-sized sanitary pads in an hour . Cramps that last longer than 2 days . Foul smelling discharge . Fever above 100.4 degrees F Even if you do not have any of these symptoms, you should have a follow-up exam to make sure you  are healing properly. This appointment will be made for you before you leave the hospital. Your next normal period will start again in 4-6 week after the loss. You can get pregnant soon after the loss, so use birth control right away. Finally: Make sure all your questions are answered before during and after any procedure. Follow up with medical care and family planning methods.

## 2020-11-25 NOTE — MAU Provider Note (Addendum)
Patient [name] is a 34 y.o. [id number] At [redacted]w[redacted]d here with complaints of bleeding and abdominal pain. She denies dysuria, fever, SOB, muscle aches. She denies other abnormal discharge. She denies nausea, vomiting, diarrhea.   Patient is anxious-appearing; is concerned that she is having a miscarriage.   History     CSN: [id number]  Arrival date and time: 11/25/20 1641   None     Chief Complaint  Patient presents with  . Abdominal Pain  . Vaginal Bleeding   Abdominal Pain This is a new problem. The current episode started today. The pain is located in the suprapubic region. The pain is at a severity of 8/10. The quality of the pain is cramping. Pain radiation: "stays in the pelvis area" Associated symptoms include nausea. Pertinent negatives include no constipation, diarrhea, dysuria or vomiting. Associated symptoms comments: ongoing.  Vaginal Bleeding The patient's primary symptoms include vaginal bleeding. This is a recurrent problem. Associated symptoms include abdominal pain and nausea. Pertinent negatives include no constipation, diarrhea, dysuria or vomiting. The vaginal discharge was bloody. The vaginal bleeding is spotting. She has not been passing clots. She has not been passing tissue.    OB History    Gravida  7   Para  5   Term  4   Preterm  1   AB  1   Living  5     SAB  1   IAB      Ectopic      Multiple  0   Live Births  5           Past Medical History:  Diagnosis Date  . Anxiety   . Asthma    inhaler PRN-hasn't used inhaler in months  . Attention deficit hyperactivity disorder   . Bipolar disorder [facility])    hospitalized as a teen for suicidal ideations and cutting  . Depression    no complaints, doing well  . Gastritis   . GERD (gastroesophageal reflux disease)   . History of gestational diabetes in prior pregnancy, currently pregnant 10/13/2017  . History of preterm delivery after IOL for preeclampsia, currently pregnant  10/13/2017   Not a 17P candidate  . Hx of varicella   . Mild preeclampsia 02/09/2016  . Vaginal Pap smear, abnormal     Past Surgical History:  Procedure Laterality Date  . EYE MUSCLE SURGERY Bilateral    07/29/12  . MOUTH SURGERY    . PLANTAR FASCIA SURGERY    . WISDOM TOOTH EXTRACTION      Family History  Problem Relation Age of Onset  . Depression Mother   . Colon cancer Mother   . Colon polyps Mother   . Depression Father   . Lung cancer Father   . Depression Brother   . Liver disease Other   . Kidney disease Other     Social History   Tobacco Use  . Smoking status: Never Smoker  . Smokeless tobacco: Never Used  Vaping Use  . Vaping Use: Never used  Substance Use Topics  . Alcohol use: No    Alcohol/week: 0.0 standard drinks  . Drug use: No    Allergies:  Allergies  Allergen Reactions  . Depakote [Divalproex Sodium] Other (See Comments)    Pt states that this medication makes her blood levels toxic.    [name] Methylphenidate Derivatives Other (See Comments)    Reaction:  Depression and anger   . Neurontin [Gabapentin] Other (See Comments)    Reaction:  Dizziness   . Prozac [Fluoxetine Hcl] Other (See Comments)    Reaction:  Anger   . Methylphenidate Hcl     SAME AS PREVIOUS LISTED METHYLPHENIDATE    Medications Prior to Admission  Medication Sig Dispense Refill Last Dose  . chlorproMAZINE (THORAZINE) 25 MG tablet Take 25 mg by mouth daily.   11/24/2020 at Unknown time  . nortriptyline (PAMELOR) 25 MG capsule Take 25 mg by mouth at bedtime.   11/24/2020 at Unknown time  . promethazine (PHENERGAN) 50 MG tablet Take 50 mg by mouth at bedtime.     . Sertraline HCl (ZOLOFT PO) Take 10 mg by mouth at bedtime.       Review of Systems  Constitutional: Negative.   HENT: Negative.   Respiratory: Negative.   Gastrointestinal: Positive for abdominal pain and nausea. Negative for constipation, diarrhea and vomiting.  Genitourinary: Positive for vaginal bleeding.  Negative for dysuria.   Physical Exam   Blood pressure (!) 141/75, pulse 96, temperature 98.7 F (37.1 C), resp. rate 17, last menstrual period 09/14/2020, SpO2 96 %, not currently breastfeeding.  Physical Exam Constitutional:      Appearance: She is well-developed.  HENT:     Head: Normocephalic.  Abdominal:     General: Abdomen is flat. Bowel sounds are normal.  Neurological:     Mental Status: She is alert.   declines  MAU Course  Procedures  MDM -formal US confirms failed pregnancy;  I have independently reviewed the [place] images, which reveal finding of failed pregnancy. Discussed with Dr. Ilda Basset and after comparing imaging, Dr. Ilda Basset concludes failed pregnancy as well.  -Counseled patient about options; D&C vs. expectant management vs. Medication. Patient would like cytotec and requests that RN place vaginally in the MAU.   -Patient declines blood draw for CBC; last Hgb was 12.8 on 11/26.  Discussed with Dr. Ilda Basset. Patient has had minimal bleeding and therefore unlikely to have drop in Hemoglobin over the past 14 days; patient ok to have cytotec.               Assessment and Plan   1. Miscarriage   2. Vaginal bleeding    -cytotec placed vaginally by RN. Reviewed in detail with [name] the  medications for nausea, abdominal pain and when to take repeat dose of medicine. Patient confirms that she has transportation if necessary.  -Patient does not want [place] in two weeks, but agrees to provider visit to check on status of MAB.  -Reviewed in detail bleeding precautions, signs of infection and when to return to MAU.  -All questions answered; message sent to [facility] to schedule patient for provider visit in two weeks.   Early Intrauterine Pregnancy Failure Protocol X  Documented intrauterine pregnancy failure less than or equal to [redacted] weeks   gestation  X  No serious current illness  X  Baseline Hgb greater than or equal to 10g/dl  X  Patient has easily accessible  transportation to the hospital  X  Clear preference  X  Practitioner/physician deems patient reliable  X  Counseling by practitioner or physician  X  Patient education by RN  X  Consent form signed       Rho-Gam given by RN if indicated  X  Medication dispensed  _  Cytotec 800 mcg Intravaginally by patient at home       Intravaginally by NP in MAU       Rectally by patient at home    X  Vaginally by RN in MAU  X   Ibuprofen 600 mg 1 tablet by mouth every 6 hours as needed #30 - prescribed  X   Tylenol #3 mg by mouth every 4 to 6 hours as needed - prescribed  X   Phenergan 12.5 mg by mouth every 4 hours as needed for nausea - prescribed  Reviewed with pt cytotec procedure.  Pt verbalizes that she lives close to the hospital and has transportation readily available.  Pt appears reliable and verbalizes understanding and agrees with plan of care     Mervyn Skeeters [facility] 11/25/2020, 5:41 PM

## 2020-11-25 NOTE — MAU Note (Signed)
Pt reports to mau with c/o lower abd cramping and spotting. Denies vag itching or dc.

## 2020-11-25 NOTE — MAU Note (Signed)
K. Kooistra CNM in -plan of care discussed with patient and SO. Pt voiced understanding of [place] results and is agreeable with plan for cytotec and would like them placed vaginally.

## 2020-11-26 ENCOUNTER — Ambulatory Visit: Payer: Medicare Other

## 2020-11-26 ENCOUNTER — Telehealth: Payer: Self-pay | Admitting: Obstetrics and Gynecology

## 2020-11-26 ENCOUNTER — Ambulatory Visit: Admission: RE | Admit: 2020-11-26 | Payer: Medicare Other | Source: Ambulatory Visit

## 2020-11-26 NOTE — Telephone Encounter (Signed)
Pt states that she has questions concerning SAB and what notes say from ultrasound on MyChart. Pt request a call back ASAP. Thanks

## 2020-11-27 NOTE — Telephone Encounter (Addendum)
Returned patients call. Patient reports she took the Cytotec at the hospital. She reports the [place] reports shows that the findings are suspicious for failed pregnancy but not definitive. She would like to know why the Cytotec was given since the [place] reports shows the [place] is not definitive for failed pregnancy. Reviewed with patient that fetus did not grow during the 16 days between [place], indicating that the pregnancy was failing and why the Cytotec was offered.   She would like to have another [place] in about 2 weeks to make sure she has cleared the pregnancy.   Will reach out to the MAU Provider to call patient to discuss.   Patient would like to know how soon she can start trying for another pregnancy. Advised to give her body time to have another period before proceeding.

## 2020-11-28 ENCOUNTER — Other Ambulatory Visit: Payer: Self-pay | Admitting: Student

## 2020-11-28 DIAGNOSIS — O039 Complete or unspecified spontaneous abortion without complication: Secondary | ICD-10-CM

## 2020-12-03 ENCOUNTER — Telehealth: Payer: Self-pay | Admitting: Family Medicine

## 2020-12-03 NOTE — Telephone Encounter (Signed)
The meds that was given is not working and she want something different

## 2020-12-04 ENCOUNTER — Telehealth: Payer: Self-pay | Admitting: Family Medicine

## 2020-12-04 NOTE — Telephone Encounter (Signed)
Called patient and asked what medications she has been taking and how often as well as questions about her miscarriage process. Patient states she has tried taking ibuprofen around the clock & tylenol #3 on top of that and it isn't enough. Patient states she was given cytotec in the hospital on Sunday and started bleeding heavily with clots when she got home. She reports bleeding slowed down for a day but then started back heavy again with large clots. Patient states pain comes and goes like menstrual cramps but rates pain at a 9-10. Discussed with Marcille Buffy who recommends work in appt for patient. Offered patient appt tomorrow at 2:35pm. Patient verbalized understanding & states she will need transportation assistance. Set up assistance through transportation services and informed patient. Patient verbalized understanding.

## 2020-12-04 NOTE — Telephone Encounter (Signed)
Pt states that she has called several times concerning her pain and the meds are not working, Pt request a call pt ASAP per her request. thanks

## 2020-12-05 ENCOUNTER — Ambulatory Visit: Payer: Medicare Other | Admitting: Obstetrics and Gynecology

## 2020-12-11 ENCOUNTER — Ambulatory Visit
Admission: RE | Admit: 2020-12-11 | Discharge: 2020-12-11 | Disposition: A | Discharge status: Home / Self Care | Payer: Medicare Other | Source: Ambulatory Visit | Attending: Student | Admitting: Student

## 2020-12-11 ENCOUNTER — Other Ambulatory Visit: Payer: Self-pay

## 2020-12-11 ENCOUNTER — Ambulatory Visit (INDEPENDENT_AMBULATORY_CARE_PROVIDER_SITE_OTHER): Payer: Medicare Other

## 2020-12-11 ENCOUNTER — Ambulatory Visit: Payer: Medicare Other | Admitting: Obstetrics and Gynecology

## 2020-12-11 DIAGNOSIS — O039 Complete or unspecified spontaneous abortion without complication: Secondary | ICD-10-CM | POA: Diagnosis present

## 2020-12-11 DIAGNOSIS — R9389 Abnormal findings on diagnostic imaging of other specified body structures: Secondary | ICD-10-CM | POA: Diagnosis not present

## 2020-12-11 DIAGNOSIS — Z712 Person consulting for explanation of examination or test findings: Secondary | ICD-10-CM

## 2020-12-12 ENCOUNTER — Telehealth: Payer: Self-pay | Admitting: Obstetrics & Gynecology

## 2020-12-12 ENCOUNTER — Ambulatory Visit: Payer: Medicare Other | Admitting: Obstetrics & Gynecology

## 2020-12-12 NOTE — Telephone Encounter (Signed)
Discussed with Vonzella Nipple, PA prior to calling patient for plan of care post 2nd dose of Cytotec.   Returned patients call. She reports she placed the Cytotec at 8-10 pm last night. She is having slight abdominal pain with no bleeding.   Reviewed with her it may take 24 hours for the Cytotec to work and that if it does not she will need to follow up in the office to see a provider to discuss D&C. Patient asked to set up appt tomorrow to discuss with a provider. First Available appointment in the office is Monday at 3:55 that she has previously scheduled. Informed patient and she is aware.   Patient asked when she can try for another baby, reviewed it is best to wait 3 months to allow body to heal and hormones to regulate prior to trying again.   Reviewed with patient that if she excess pain or heavy bleeding, she can go to the MAU for evaluation over the weekend. Patient voiced understanding.

## 2020-12-12 NOTE — Progress Notes (Signed)
Late entry- pt was walked down to the office from MFM.  Per results pt has retained products.  Pt had [employer] transportation picking her up so she could not stay.  I informed pt that we do not have a surgeon here today as she requested to have a D&C and not do the second dose of cytotec.  I asked pt if she would be able to come in on 12/12/20 at 0815 so that she can speak with a surgeon.  Pt confirmed that she will be able to come to the appt in the am.  Front office notified.    Addison Naegeli, RN

## 2020-12-12 NOTE — Telephone Encounter (Signed)
Hello, this pt states that she had option of taking meds or coming in and she took meds due to anxiety of surg. states is not bleeding, is this normal or should she speak to someone. pt states could you call her back [phone]. thanks

## 2020-12-17 ENCOUNTER — Telehealth (INDEPENDENT_AMBULATORY_CARE_PROVIDER_SITE_OTHER): Payer: Medicare Other | Admitting: Obstetrics and Gynecology

## 2020-12-17 ENCOUNTER — Encounter: Payer: Self-pay | Admitting: Obstetrics and Gynecology

## 2020-12-17 DIAGNOSIS — O034 Incomplete spontaneous abortion without complication: Secondary | ICD-10-CM | POA: Diagnosis not present

## 2020-12-17 NOTE — Progress Notes (Signed)
TELEHEALTH GYNECOLOGY VISIT ENCOUNTER NOTE  Provider location: [facility] [employer] [facility] [employer] [facility]   I connected with [name] on 12/17/20 at  3:55 PM EST by telephone at home and verified that I am speaking with the correct person using two identifiers. Patient was unable to do MyChart audiovisual encounter due to technical difficulties, she tried several times.    I discussed the limitations, risks, security and privacy concerns of performing an evaluation and management service by telephone and the availability of in person appointments. I also discussed with the patient that there may be a patient responsible charge related to this service. The patient expressed understanding and agreed to proceed.   History:  [name] is a 35 y.o. [phone] female being evaluated today for follow up from missed AB and retained POC. Pt was Dx with MAB on 11/25/20 and was given Cytotec vaginal. F/U U/S on 12/11/20 reveled probably retained POC's. Pt declined D & C and took second dose of Cytotec. She has had some slight bleeding since. Home UPT was only faintly positive.       Past Medical History:  Diagnosis Date  . Anxiety   . Asthma    inhaler PRN-hasn't used inhaler in months  . Attention deficit hyperactivity disorder   . Bipolar disorder [facility])    hospitalized as a teen for suicidal ideations and cutting  . Depression    no complaints, doing well  . Gastritis   . GERD (gastroesophageal reflux disease)   . History of gestational diabetes in prior pregnancy, currently pregnant 10/13/2017  . History of preterm delivery after IOL for preeclampsia, currently pregnant 10/13/2017   Not a 17P candidate  . Hx of varicella   . Mild preeclampsia 02/09/2016  . Vaginal Pap smear, abnormal    Past Surgical History:  Procedure Laterality Date  . EYE MUSCLE SURGERY Bilateral    07/29/12  . MOUTH SURGERY    . PLANTAR FASCIA SURGERY    . WISDOM TOOTH EXTRACTION      The following portions of the patient's history were reviewed and updated as appropriate: allergies, current medications, past family history, past medical history, past social history, past surgical history and problem list.      Review of Systems:  Pertinent items noted in HPI and remainder of comprehensive ROS otherwise negative.  Physical Exam:   General:  Alert, oriented and cooperative.   Mental Status: Normal mood and affect perceived. Normal judgment and thought content.  Physical exam deferred due to nature of the encounter  Labs and Imaging No results found for this or any previous visit (from the past 336 hour(s)). US OB Comp Less 14 Wks  Result Date: 11/25/2020 CLINICAL DATA:  Initial evaluation for acute vaginal bleeding, early pregnancy. EXAM: OBSTETRIC <14 WK [place] AND TRANSVAGINAL OB US TECHNIQUE: Both transabdominal and transvaginal ultrasound examinations were performed for complete evaluation of the gestation as well as the maternal uterus, adnexal regions, and pelvic cul-de-sac. Transvaginal technique was performed to assess early pregnancy. COMPARISON:  Prior ultrasound from 11/09/2020 FINDINGS: Intrauterine gestational sac: Single Yolk sac:  Negative. Embryo:  Present. Cardiac Activity: Negative. Heart Rate: N/A CRL: 4.4 mm   6 w   1 d                  [place] EDC: 07/20/2020. Subchorionic hemorrhage:  None visualized on today's exam. Maternal uterus/adnexae: Ovaries within normal limits. No adnexal mass or free fluid. IMPRESSION: 1. Single intrauterine  pregnancy as above, estimated gestational age [redacted] weeks and 1 day by crown-rump length. No detectable yolk sac or cardiac activity, and no significant interval growth as compared to previous ultrasound from 11/09/2020. Findings are suspicious but not yet definitive for failed pregnancy. Recommend follow-up US in 10-14 days for definitive diagnosis. This recommendation follows SRU consensus guidelines: Diagnostic Criteria for  Nonviable Pregnancy Early in the First Trimester. [place] Med [id number]. 2. Previously seen small subchorionic hemorrhage not visible on today's exam. 3. No other acute maternal uterine or adnexal abnormality. Electronically Signed   By: Jeannine Boga M.D.   On: 11/25/2020 19:01   US OB Transvaginal  Result Date: 11/25/2020 CLINICAL DATA:  Initial evaluation for acute vaginal bleeding, early pregnancy. EXAM: OBSTETRIC <14 WK [place] AND TRANSVAGINAL OB US TECHNIQUE: Both transabdominal and transvaginal ultrasound examinations were performed for complete evaluation of the gestation as well as the maternal uterus, adnexal regions, and pelvic cul-de-sac. Transvaginal technique was performed to assess early pregnancy. COMPARISON:  Prior ultrasound from 11/09/2020 FINDINGS: Intrauterine gestational sac: Single Yolk sac:  Negative. Embryo:  Present. Cardiac Activity: Negative. Heart Rate: N/A CRL: 4.4 mm   6 w   1 d                  [place] EDC: 07/20/2020. Subchorionic hemorrhage:  None visualized on today's exam. Maternal uterus/adnexae: Ovaries within normal limits. No adnexal mass or free fluid. IMPRESSION: 1. Single intrauterine pregnancy as above, estimated gestational age [redacted] weeks and 1 day by crown-rump length. No detectable yolk sac or cardiac activity, and no significant interval growth as compared to previous ultrasound from 11/09/2020. Findings are suspicious but not yet definitive for failed pregnancy. Recommend follow-up US in 10-14 days for definitive diagnosis. This recommendation follows SRU consensus guidelines: Diagnostic Criteria for Nonviable Pregnancy Early in the First Trimester. [place] Med [id number]. 2. Previously seen small subchorionic hemorrhage not visible on today's exam. 3. No other acute maternal uterine or adnexal abnormality. Electronically Signed   By: Jeannine Boga M.D.   On: 11/25/2020 19:01   US OB LESS THAN 14 WEEKS WITH OB TRANSVAGINAL  Result Date:  12/11/2020 CLINICAL DATA:  Evaluation for retained products of conception. Status post miscarriage. Last menstrual period 09/14/2020, gestational age by last menstrual period 12 weeks and 4 days. Estimated due date 06/21/2021. Today's beta HCG is not yet available. EXAM: OBSTETRIC <14 WK [place] AND TRANSVAGINAL OB US TECHNIQUE: Both transabdominal and transvaginal ultrasound examinations were performed for complete evaluation of the gestation as well as the maternal uterus, adnexal regions, and pelvic cul-de-sac. Transvaginal technique was performed to assess early pregnancy. COMPARISON:  Ultrasound Ob 11/25/2020 FINDINGS: Intrauterine gestational sac: None Yolk sac:  Not Visualized. Embryo:  Not Visualized. Cardiac Activity: Not Visualized. Subchorionic hemorrhage:  None visualized. Maternal uterus/adnexae: Vascular thickened, hyperechoic, heterogeneous endometrium measuring up to 24 mm. The remainder of the uterus is unremarkable. The ovaries are unremarkable with the right ovary measuring 2.7 x 1.5 x 2.5 cm in the left ovary measuring 2.4 x 1.4 x 1.7 cm. IMPRESSION: Thickened and vascular endometrium likely represents retained products of conception in a patient status post failed pregnancy. Electronically Signed   By: Iven Finn M.D.   On: 12/11/2020 15:29      Assessment and Plan:     1. Retained products of conception after miscarriage Will check GYN U/S and follow BHCG's as indicated - US PELVIC COMPLETE WITH TRANSVAGINAL; Future Blood type O positive  I discussed the assessment and treatment plan with the patient. The patient was provided an opportunity to ask questions and all were answered. The patient agreed with the plan and demonstrated an understanding of the instructions.   The patient was advised to call back or seek an in-person evaluation/go to the ED if the symptoms worsen or if the condition fails to improve as anticipated.  I provided 8 minutes of non-face-to-face time during  this encounter.   Chancy Milroy, MD [facility] [place], [place]

## 2020-12-17 NOTE — Progress Notes (Signed)
[place] appointment 12/27/2020 @ 1pm.

## 2020-12-26 ENCOUNTER — Ambulatory Visit
Admission: RE | Admit: 2020-12-26 | Discharge: 2020-12-26 | Disposition: A | Discharge status: Home / Self Care | Payer: 59 | Source: Ambulatory Visit | Attending: Obstetrics and Gynecology | Admitting: Obstetrics and Gynecology

## 2020-12-26 ENCOUNTER — Telehealth: Payer: Self-pay | Admitting: Obstetrics & Gynecology

## 2020-12-26 ENCOUNTER — Ambulatory Visit (INDEPENDENT_AMBULATORY_CARE_PROVIDER_SITE_OTHER): Payer: 59

## 2020-12-26 ENCOUNTER — Other Ambulatory Visit: Payer: Self-pay

## 2020-12-26 DIAGNOSIS — O034 Incomplete spontaneous abortion without complication: Secondary | ICD-10-CM | POA: Diagnosis present

## 2020-12-26 DIAGNOSIS — Z712 Person consulting for explanation of examination or test findings: Secondary | ICD-10-CM | POA: Diagnosis not present

## 2020-12-26 NOTE — Telephone Encounter (Signed)
Received a call from the patient requesting she be called back today. Spoke with Doctor, hospital.

## 2020-12-26 NOTE — Progress Notes (Unsigned)
Pt was brought down from U/S to get results.  However pt reports that she needs to leave and someone can call her.  Reviewed U/S results with Dr. Rip Harbour who recommends that pt have a consult with a surgeon for [facility].  Pt asks if this could be another pregnancy.  I explained to the pt that it could not because she is still currently with residual products of conception.  I advised pt to please come in at [place] so that she can speak with a provider.  Pt began to ask questions about the surgery.  I encouraged pt to come her appt so that all her questions and concerns can be addressed.  Pt verbalized understanding and that she will be coming to the appt.    Mel Almond, RN  12/27/20

## 2020-12-27 ENCOUNTER — Telehealth: Payer: Self-pay | Admitting: Family Medicine

## 2020-12-27 ENCOUNTER — Telehealth (INDEPENDENT_AMBULATORY_CARE_PROVIDER_SITE_OTHER): Payer: 59 | Admitting: Obstetrics and Gynecology

## 2020-12-27 ENCOUNTER — Telehealth: Payer: Self-pay

## 2020-12-27 ENCOUNTER — Ambulatory Visit: Payer: 59 | Admitting: Obstetrics and Gynecology

## 2020-12-27 ENCOUNTER — Ambulatory Visit: Admission: RE | Admit: 2020-12-27 | Payer: 59 | Source: Ambulatory Visit

## 2020-12-27 DIAGNOSIS — O034 Incomplete spontaneous abortion without complication: Secondary | ICD-10-CM

## 2020-12-27 NOTE — Progress Notes (Signed)
Agree with A & P. 

## 2020-12-27 NOTE — Progress Notes (Signed)
GYNECOLOGY VIRTUAL VISIT ENCOUNTER NOTE  Provider location: [facility] [employer] [facility]   I connected with [name] on 12/27/20 at  9:35 AM EST by MyChart Video Encounter at home and verified that I am speaking with the correct person using two identifiers.   I discussed the limitations, risks, security and privacy concerns of performing an evaluation and management service virtually and the availability of in person appointments. I also discussed with the patient that there may be a patient responsible charge related to this service. The patient expressed understanding and agreed to proceed.   History:  [name] is a 35 y.o. [phone] female being evaluated today for possible retained products of conception.  The patient had a verified missed miscarriage  Att 6 weeks 1 day on 11/09/20.  Pt was given misoprostol to begin clearance of POC.  Pt did note some bleeding and possible passage of tissue.  She was unsure if she passed everything and even though she was offered an office visit on 12/12/20 the patient took another dose of misoprostol.  She was seen by MD on 12/17/20 for concern of possible retained POCs. Another u/s was ordered which continued to show thickened endometrium.  Pt now request D and C for definitive therapy.     Past Medical History:  Diagnosis Date  . Anxiety   . Asthma    inhaler PRN-hasn't used inhaler in months  . Attention deficit hyperactivity disorder   . Bipolar disorder [facility])    hospitalized as a teen for suicidal ideations and cutting  . Depression    no complaints, doing well  . Gastritis   . GERD (gastroesophageal reflux disease)   . History of gestational diabetes in prior pregnancy, currently pregnant 10/13/2017  . History of preterm delivery after IOL for preeclampsia, currently pregnant 10/13/2017   Not a 17P candidate  . Hx of varicella   . Mild preeclampsia 02/09/2016  . Vaginal Pap smear, abnormal     Past Surgical History:  Procedure Laterality Date  . EYE MUSCLE SURGERY Bilateral    07/29/12  . MOUTH SURGERY    . PLANTAR FASCIA SURGERY    . WISDOM TOOTH EXTRACTION     The following portions of the patient's history were reviewed and updated as appropriate: allergies, current medications, past family history, past medical history, past social history, past surgical history and problem list.   Health Maintenance:  Normal pap and negative HRHPV on 07/26/20.    Review of Systems:  Pertinent items noted in HPI and remainder of comprehensive ROS otherwise negative.  Physical Exam:   General:  Alert, oriented and cooperative. Patient appears to be in no acute distress.  Mental Status: Normal mood and affect. Normal behavior. Normal judgment and thought content.   Respiratory: Normal respiratory effort, no problems with respiration noted  Rest of physical exam deferred due to type of encounter  Labs and Imaging No results found for this or any previous visit (from the past 336 hour(s)). US OB Transvaginal  Addendum Date: 12/26/2020   ADDENDUM REPORT: 12/26/2020 15:25 TECHNIQUE: TRANSVAGINAL ultrasound was performed for evaluation of the gestation as well as the maternal uterus and adnexal regions. Electronically Signed   By: Kathreen Devoid   On: 12/26/2020 15:25   Result Date: 12/26/2020 CLINICAL DATA:  Status post side Cytotec x2 12/11/2020. EXAM: OBSTETRIC <14 WK ULTRASOUND TECHNIQUE: Transabdominal ultrasound was performed for evaluation of the gestation as well as the maternal uterus  and adnexal regions. COMPARISON:  None. FINDINGS: Intrauterine gestational sac: None Yolk sac:  Not Visualized. Embryo:  Not Visualized. Cardiac Activity: Not Visualized. Subchorionic hemorrhage:  None visualized. Maternal uterus/adnexae: Ovaries are normal. Small hypoechoic area with hypervascularity likely reflects a prominent vessel and blood products. Thickened endometrium measuring 17 mm with  hypervascularity. No pelvic free fluid. IMPRESSION: Small hypoechoic area with hypervascularity likely reflects a prominent vessel and blood products. Thickened endometrium measuring 17 mm with hypervascularity. Sonographic findings are compatible with RPOC in the correct clinical setting. Electronically Signed: By: Kathreen Devoid On: 12/26/2020 15:03   US OB LESS THAN 14 WEEKS WITH OB TRANSVAGINAL  Result Date: 12/11/2020 CLINICAL DATA:  Evaluation for retained products of conception. Status post miscarriage. Last menstrual period 09/14/2020, gestational age by last menstrual period 12 weeks and 4 days. Estimated due date 06/21/2021. Today's beta HCG is not yet available. EXAM: OBSTETRIC <14 WK [place] AND TRANSVAGINAL OB US TECHNIQUE: Both transabdominal and transvaginal ultrasound examinations were performed for complete evaluation of the gestation as well as the maternal uterus, adnexal regions, and pelvic cul-de-sac. Transvaginal technique was performed to assess early pregnancy. COMPARISON:  Ultrasound Ob 11/25/2020 FINDINGS: Intrauterine gestational sac: None Yolk sac:  Not Visualized. Embryo:  Not Visualized. Cardiac Activity: Not Visualized. Subchorionic hemorrhage:  None visualized. Maternal uterus/adnexae: Vascular thickened, hyperechoic, heterogeneous endometrium measuring up to 24 mm. The remainder of the uterus is unremarkable. The ovaries are unremarkable with the right ovary measuring 2.7 x 1.5 x 2.5 cm in the left ovary measuring 2.4 x 1.4 x 1.7 cm. IMPRESSION: Thickened and vascular endometrium likely represents retained products of conception in a patient status post failed pregnancy. Electronically Signed   By: Iven Finn M.D.   On: 12/11/2020 15:29       Assessment and Plan:     Retained products of conception     I discussed no further misoprostol treatments.  For definitive clearance of POC, pt will need suction dilation and curettage.  Risks and benefits of the procedure given  including bleeding, infection, involvement of other organs and uterine perforation.  Discussed with pt I will relay the decision to surgical scheduler to set up the procedure.  Pt will need formal H and P before procedure. I discussed the assessment and treatment plan with the patient. The patient was provided an opportunity to ask questions and all were answered. The patient agreed with the plan and demonstrated an understanding of the instructions.   The patient was advised to call back or seek an in-person evaluation/go to the ED if the symptoms worsen or if the condition fails to improve as anticipated.  I provided 20 minutes of face-to-face time during this encounter.   Griffin Basil, MD [facility], [place]

## 2020-12-27 NOTE — Progress Notes (Signed)
I connected with  [name] on 12/27/20 at  9:35 AM EST by telephone and verified that I am speaking with the correct person using two identifiers.   I discussed the limitations, risks, security and privacy concerns of performing an evaluation and management service by telephone and the availability of in person appointments. I also discussed with the patient that there may be a patient responsible charge related to this service. The patient expressed understanding and agreed to proceed.  Verdell Carmine, RN 12/27/2020  10:05 AM

## 2020-12-27 NOTE — Progress Notes (Signed)
Pt call transferred from the front office asking when she will get her surgery scheduled.  I explained to the pt that the surgery scheduler will contact her with an appt.  Pt asked if she would here from someone today.  I informed pt that I could not say for sure if she would be called today.  I informed pt that the provider has indicated the importance of her surgery and that I could not guarantee when but it would be scheduled.  Pt asked what she would need to do.  I informed pt that after she is notified of her surgery that someone from preadmission will call her as well and let her know what she needs to do as far as COVID testing and possibly quarantining.  Pt begin to say "well if I refuse the COVID testing will my surgery be cancelled?"  I informed pt that I was not sure if that would happen however for a test that takes 10 seconds or so to keep her and those who are taking care of her safe.  Pt then states "well what if I want to get it done at [facility]?"  I explained to the pt that she should share her with preadmission nurse and they would better be able to help her.  I advised pt to watch for those calls.  Pt verbalized understanding with on further questions.    Mel Almond, RN  12/27/20

## 2020-12-27 NOTE — Telephone Encounter (Signed)
Called pt and pt informed me that she can not make it to her appt today.  Pt asked if she could be seen on 12/31/20.  I advised pt that due to the inclement weather we may not be open that day and it would be unsafe for her travel.  Dr. Mickie Kay a virtual appt for [facility] consult instead of in person visit. Pt appt revised by the front office staff.    Mel Almond, RN  12/27/20

## 2020-12-27 NOTE — Telephone Encounter (Signed)
Received a call from [name] and stated she wanted to speak to the preop department, asked patient her name and confirmed patient was scheduled for surgery. Explained to her I just scheduled her surgery and she needed to wait and give the staff time to contact her. [name] then states she is not getting up at 8am for no surgery and it needed to be rescheduled. Explained to her due to COVID bed restrictions and OR availability, that was all I had available and given the nature of what she was scheduled for she needed to try to be flexible. Asked patient if she would like to make an appointment in the office to discuss other options, patient hung up on me.  Notified Dr. Elgie Congo (requesting physician for surgery)  Surgery cancelled. Office staff sent a message.

## 2020-12-27 NOTE — Telephone Encounter (Signed)
Pt called and stated that something has come up and she can not come in today for her appt.  I tried to see if someone could talk to pt while she was on the phone but pt declined and stated that she does not need to talk to anyone but just needs to resch. . Pt has cx several times and come in to appts and left. Please advise of when to resch pt. thanks

## 2021-01-01 ENCOUNTER — Ambulatory Visit (HOSPITAL_BASED_OUTPATIENT_CLINIC_OR_DEPARTMENT_OTHER): Admit: 2021-01-01 | Payer: 59 | Admitting: Obstetrics and Gynecology

## 2021-01-01 ENCOUNTER — Telehealth: Payer: Self-pay | Admitting: Family Medicine

## 2021-01-01 ENCOUNTER — Encounter (HOSPITAL_BASED_OUTPATIENT_CLINIC_OR_DEPARTMENT_OTHER): Payer: Self-pay

## 2021-01-01 SURGERY — DILATION AND EVACUATION, UTERUS
Anesthesia: Choice

## 2021-01-01 NOTE — Telephone Encounter (Signed)
Pt called and stated that she needs to schedule her surg and is aware that she cx the last due to the time of morning, but pt states that she is able to come anytime of the day, even early morning as well, due to pt states she is having some side effects from her issue and needs to go ahead and get surg. Pt would like a call ASAP. Toya Smothers

## 2021-01-02 ENCOUNTER — Inpatient Hospital Stay (HOSPITAL_COMMUNITY)
Admission: AD | Admit: 2021-01-02 | Discharge: 2021-01-02 | Disposition: A | Discharge status: Home / Self Care | Payer: 59 | Attending: Obstetrics and Gynecology | Admitting: Obstetrics and Gynecology

## 2021-01-02 ENCOUNTER — Encounter: Payer: Self-pay | Admitting: *Deleted

## 2021-01-02 ENCOUNTER — Telehealth: Payer: Self-pay | Admitting: Family Medicine

## 2021-01-02 ENCOUNTER — Encounter (HOSPITAL_COMMUNITY): Payer: Self-pay | Admitting: Obstetrics and Gynecology

## 2021-01-02 ENCOUNTER — Other Ambulatory Visit: Payer: Self-pay

## 2021-01-02 DIAGNOSIS — R509 Fever, unspecified: Secondary | ICD-10-CM | POA: Insufficient documentation

## 2021-01-02 DIAGNOSIS — R109 Unspecified abdominal pain: Secondary | ICD-10-CM | POA: Diagnosis present

## 2021-01-02 DIAGNOSIS — Z3A15 15 weeks gestation of pregnancy: Secondary | ICD-10-CM | POA: Insufficient documentation

## 2021-01-02 DIAGNOSIS — O26892 Other specified pregnancy related conditions, second trimester: Secondary | ICD-10-CM | POA: Diagnosis not present

## 2021-01-02 DIAGNOSIS — O034 Incomplete spontaneous abortion without complication: Secondary | ICD-10-CM

## 2021-01-02 DIAGNOSIS — Z20822 Contact with and (suspected) exposure to covid-19: Secondary | ICD-10-CM | POA: Diagnosis not present

## 2021-01-02 DIAGNOSIS — R5081 Fever presenting with conditions classified elsewhere: Secondary | ICD-10-CM | POA: Diagnosis not present

## 2021-01-02 LAB — CBC
HCT: 40 % (ref 36.0–46.0)
Hemoglobin: 12.1 g/dL (ref 12.0–15.0)
MCH: 28.3 pg (ref 26.0–34.0)
MCHC: 30.3 g/dL (ref 30.0–36.0)
MCV: 93.7 fL (ref 80.0–100.0)
Platelets: 171 10*3/uL (ref 150–400)
RBC: 4.27 MIL/uL (ref 3.87–5.11)
RDW: 14.1 % (ref 11.5–15.5)
WBC: 5.6 10*3/uL (ref 4.0–10.5)
nRBC: 0 % (ref 0.0–0.2)

## 2021-01-02 LAB — COMPREHENSIVE METABOLIC PANEL
ALT: 15 U/L (ref 0–44)
AST: 25 U/L (ref 15–41)
Albumin: 3.6 g/dL (ref 3.5–5.0)
Alkaline Phosphatase: 89 U/L (ref 38–126)
Anion gap: 8 (ref 5–15)
BUN: 11 mg/dL (ref 6–20)
CO2: 26 mmol/L (ref 22–32)
Calcium: 9.1 mg/dL (ref 8.9–10.3)
Chloride: 106 mmol/L (ref 98–111)
Creatinine, Ser: 0.79 mg/dL (ref 0.44–1.00)
GFR, Estimated: >60 mL/min (ref >60)
Glucose, Bld: 102 mg/dL — ABNORMAL HIGH (ref 70–99)
Potassium: 4.9 mmol/L (ref 3.5–5.1)
Sodium: 140 mmol/L (ref 135–145)
Total Bilirubin: 0.9 mg/dL (ref 0.3–1.2)
Total Protein: 6.9 g/dL (ref 6.5–8.1)

## 2021-01-02 LAB — RESP PANEL BY RT-PCR (FLU A&B, COVID) ARPGX2
Influenza A by PCR: NEGATIVE
Influenza B by PCR: NEGATIVE
SARS Coronavirus 2 by RT PCR: NEGATIVE

## 2021-01-02 MED ORDER — SODIUM CHLORIDE 0.9 % IV SOLN
3.0000 g | Freq: Four times a day (QID) | INTRAVENOUS | Status: DC
  Filled 2021-01-02 (×3): qty 8

## 2021-01-02 MED ORDER — LACTATED RINGERS IV BOLUS: 125.0000 mL | Freq: Once | INTRAVENOUS | Status: DC

## 2021-01-02 MED ORDER — SODIUM CHLORIDE 0.9 % IV SOLN
1.0000 g | Freq: Once | INTRAVENOUS | Status: DC
  Filled 2021-01-02: qty 1000

## 2021-01-02 NOTE — MAU Provider Note (Signed)
History     CSN: [id number] Arrival date and time: 01/02/21 1402  Chief Complaint  Patient presents with  . Abdominal Pain   HPI  [name] is a [id number] at [redacted]w[redacted]d presenting with known missed AB dx 11/09/20 at [redacted]w[redacted]d, had scheduled DC on 1/18 but did not shows/canceled due to the time. She took cytotec x 2 and failed to pass the pregnancy.  She is presenting today after calling the office and reporting that she "started having symptoms"  Abdominal pain: present for a couple weeks. Rates as 9 of 10 without medications. Lessens to 3 of 10 with taking Midol. Report worsening pain since the last assessment. She describes it as stabbing and 'excurciating'  Fever: Reports a fever yesterday 100.6F. Denies chills. Took tylenol and it improved. She reports no continued fevers  Past Medical History:  Diagnosis Date  . Anxiety   . Asthma    inhaler PRN-hasn't used inhaler in months  . Attention deficit hyperactivity disorder   . Bipolar disorder [facility])    hospitalized as a teen for suicidal ideations and cutting  . Depression    no complaints, doing well  . Gastritis   . GERD (gastroesophageal reflux disease)   . History of gestational diabetes in prior pregnancy, currently pregnant 10/13/2017  . History of preterm delivery after IOL for preeclampsia, currently pregnant 10/13/2017   Not a 17P candidate  . Hx of varicella   . Mild preeclampsia 02/09/2016  . Vaginal Pap smear, abnormal     Past Surgical History:  Procedure Laterality Date  . EYE MUSCLE SURGERY Bilateral    07/29/12  . MOUTH SURGERY    . PLANTAR FASCIA SURGERY    . WISDOM TOOTH EXTRACTION      Family History  Problem Relation Age of Onset  . Depression Mother   . Colon cancer Mother   . Colon polyps Mother   . Depression Father   . Lung cancer Father   . Depression Brother   . Liver disease Other   . Kidney disease Other     Social History   Tobacco Use  . Smoking status: Never Smoker  . Smokeless  tobacco: Never Used  Vaping Use  . Vaping Use: Never used  Substance Use Topics  . Alcohol use: No    Alcohol/week: 0.0 standard drinks  . Drug use: No    Allergies:  Allergies  Allergen Reactions  . Depakote [Divalproex Sodium] Other (See Comments)    Pt states that this medication makes her blood levels toxic.    [name] Methylphenidate Derivatives Other (See Comments)    Reaction:  Depression and anger   . Neurontin [Gabapentin] Other (See Comments)    Reaction:  Dizziness   . Prozac [Fluoxetine Hcl] Other (See Comments)    Reaction:  Anger   . Methylphenidate Hcl     SAME AS PREVIOUS LISTED METHYLPHENIDATE    Medications Prior to Admission  Medication Sig Dispense Refill Last Dose  . acetaminophen (TYLENOL) 500 MG tablet Take 500 mg by mouth every 6 (six) hours as needed.   01/02/2021 at 1130  . ALPRAZolam (XANAX) 0.5 MG tablet Take 0.5 mg by mouth at bedtime as needed for anxiety.   Past Month at Unknown time  . ARIPiprazole (ABILIFY) 2 MG tablet Take 2 mg by mouth at bedtime.   01/01/2021 at Unknown time  . Sertraline HCl (ZOLOFT PO) Take 100 mg by mouth at bedtime.   01/02/2021 at Unknown time  . acetaminophen-codeine (  TYLENOL #3) 300-30 MG tablet Take 1-2 tablets by mouth every 6 (six) hours as needed for moderate pain. (Patient not taking: Reported on 12/27/2020) 15 tablet 0   . ibuprofen (ADVIL) 800 MG tablet Take 1 tablet (800 mg total) by mouth every 8 (eight) hours as needed. (Patient not taking: Reported on 12/27/2020) 30 tablet 0   . promethazine (PHENERGAN) 12.5 MG tablet Take 1 tablet (12.5 mg total) by mouth every 6 (six) hours as needed for nausea or vomiting. (Patient taking differently: Take 50 mg by mouth every 6 (six) hours as needed for nausea or vomiting.) 30 tablet 0     Review of Systems  Constitutional: Positive for fever. Negative for chills.  HENT: Negative for congestion and sore throat.   Eyes: Negative for pain and visual disturbance.  Respiratory:  Negative for cough, chest tightness and shortness of breath.   Cardiovascular: Negative for chest pain.  Gastrointestinal: Positive for abdominal pain. Negative for diarrhea, nausea and vomiting.  Endocrine: Negative for cold intolerance and heat intolerance.  Genitourinary: Negative for dysuria and flank pain.  Musculoskeletal: Negative for back pain.  Skin: Negative for rash.  Allergic/Immunologic: Negative for food allergies.  Neurological: Negative for dizziness and light-headedness.  Psychiatric/Behavioral: Negative for agitation.   Physical Exam   Blood pressure 134/83, pulse 97, temperature 98.9 F (37.2 C), temperature source Oral, resp. rate 16, last menstrual period 09/14/2020, SpO2 98 %, unknown if currently breastfeeding.  Physical Exam Vitals and nursing note reviewed.  Constitutional:      General: She is not in acute distress.    Appearance: She is well-developed and well-nourished.  HENT:     Head: Normocephalic and atraumatic.  Eyes:     General: No scleral icterus.    Conjunctiva/sclera: Conjunctivae normal.  Cardiovascular:     Rate and Rhythm: Normal rate.     Pulses: Intact distal pulses.  Pulmonary:     Effort: Pulmonary effort is normal.  Chest:     Chest wall: No tenderness.  Abdominal:     General: Abdomen is flat. There is no distension or abdominal bruit. There are no signs of injury.     Palpations: Abdomen is soft.     Tenderness: There is abdominal tenderness in the suprapubic area. There is no guarding or rebound.    Genitourinary:    Vagina: Normal.  Musculoskeletal:        General: No edema. Normal range of motion.     Cervical back: Normal range of motion and neck supple.  Skin:    General: Skin is warm and dry.     Findings: No rash.  Neurological:     Mental Status: She is alert and oriented to person, place, and time.  Psychiatric:        Mood and Affect: Mood and affect normal.    MAU Course  Procedures  MDM  3:42 PM the  patient refused blood draw from phlebotomy. I discussed with the patient the need to assess her blood work for signs of infection. She was demanding to know if she can have her surgery tomorrow morning. I discussed that my concern was for a septic abortion/infection in her uterus.   4:19 PM Patient wants to leave. She reports a 4:50 PM appointment that she needs to get to. She voiced understanding that I am worried about a septic abortion/uteirne infection. I discussed possible admission for DC in AM with ABX overnight given her symptoms. She refuses admission. She does not want  to stay for lab results or for antibiotic dose.  She understands I cannot determine if she has a severe infection. I discussed that she is leaving against my advice. I reviewed the return precautions including worsening abdominal pain, fevers, nausea, vomiting. She voiced understanding that she is leaving against my medical advice.   Reviewed with patient that she has scheduled DC on 01/09/20 and she is aware. She is aware she need to be NPO.    Results for orders placed or performed during the hospital encounter of 01/02/21 (from the past 24 hour(s))  Type and screen [place]     Status: None   Collection Time: 01/02/21  2:22 PM  Result Value Ref Range   ABO/RH(D) O POS    Antibody Screen NEG    Sample Expiration      01/05/2021,2359 Performed at [facility], [place] [address] [place], [place] [address]   Resp Panel by RT-PCR (Flu A&B, Covid) Nasopharyngeal Swab     Status: None   Collection Time: 01/02/21  2:29 PM   Specimen: Nasopharyngeal Swab; Nasopharyngeal(NP) swabs in vial transport medium  Result Value Ref Range   SARS Coronavirus 2 by RT PCR NEGATIVE NEGATIVE   Influenza A by PCR NEGATIVE NEGATIVE   Influenza B by PCR NEGATIVE NEGATIVE  CBC     Status: None   Collection Time: 01/02/21  2:35 PM  Result Value Ref Range   WBC 5.6 4.0 - 10.5 K/uL   RBC 4.27 3.87 - 5.11 MIL/uL    Hemoglobin 12.1 12.0 - 15.0 g/dL   HCT 40.0 36.0 - 46.0 %   MCV 93.7 80.0 - 100.0 fL   MCH 28.3 26.0 - 34.0 pg   MCHC 30.3 30.0 - 36.0 g/dL   RDW 14.1 11.5 - 15.5 %   Platelets 171 150 - 400 K/uL   nRBC 0.0 0.0 - 0.2 %  Comprehensive metabolic panel     Status: Abnormal   Collection Time: 01/02/21  2:35 PM  Result Value Ref Range   Sodium 140 135 - 145 mmol/L   Potassium 4.9 3.5 - 5.1 mmol/L   Chloride 106 98 - 111 mmol/L   CO2 26 22 - 32 mmol/L   Glucose, Bld 102 (H) 70 - 99 mg/dL   BUN 11 6 - 20 mg/dL   Creatinine, Ser 0.79 0.44 - 1.00 mg/dL   Calcium 9.1 8.9 - 10.3 mg/dL   Total Protein 6.9 6.5 - 8.1 g/dL   Albumin 3.6 3.5 - 5.0 g/dL   AST 25 15 - 41 U/L   ALT 15 0 - 44 U/L   Alkaline Phosphatase 89 38 - 126 U/L   Total Bilirubin 0.9 0.3 - 1.2 mg/dL   GFR, Estimated >60 >60 mL/min   Anion gap 8 5 - 15     Assessment and Plan   #Abdominal pain/Fever - Concerning in the setting of known retained POC - I reviewed the labs after the patient left, I am reassured by her lack of WBC which is reassuring.  - CMP WNL, no concerns for intraabdominal pathology - Her VS and labs do not qualify as SIRS/Sepsis at this point - COVID testing negative - Given return precautions prior to ability to view labs    [employer] 01/02/2021, 3:28 PM

## 2021-01-02 NOTE — MAU Note (Signed)
.   [name] is a 35 y.o. at [redacted]w[redacted]d here in MAU reporting: Sent over by office for evaluation of her fever. She has had a 101 fever at home and took tylenol around 1130 this morning. No VB. Reports 10/10 abdominal pain. Scheduled for D&E on 01/08/21  Vitals:   01/02/21 1420 01/02/21 1427  BP: (!) 136/96 134/83  Pulse: 94 97  Resp: 16   Temp: 98.9 F (37.2 C)   SpO2: 98%

## 2021-01-02 NOTE — Telephone Encounter (Signed)
Patient want to know when she can get her surgery rescheduled state she is now having dizzy spells, patient is requesting a call back today

## 2021-01-02 NOTE — Telephone Encounter (Addendum)
I called [place] and informed her I have talked with scheduler and Doctor Elgie Congo and surgery is scheduled for next available date of 01/08/21 and she will be contacted with further details. I asked what symptoms she is is having and she reports dizziness and headaches. I informed her per provider that is not likely from her retained POC.  I informed her if she has fever, chills or worsening  abdominal pain she needs to be evaluated. She states then that I interrupted her and that she has fever and had pain. I asked if she  Has fever now and she reports 101.2. I instructed her to go to the hospital for evaluation. She voices understanding.  Linda,RN

## 2021-01-02 NOTE — MAU Note (Signed)
Patient requesting to leave AMA because she has an appointment at 1650 to get to. AMA form was signed and patient agreeable to return for surgery.

## 2021-01-02 NOTE — Telephone Encounter (Signed)
Patient also called again- See telephone encounter 01/02/21 for details. Carter Kaman,RN

## 2021-01-03 LAB — TYPE AND SCREEN
ABO/RH(D): O POS
Antibody Screen: NEGATIVE

## 2021-01-03 NOTE — Progress Notes (Addendum)
Spoke with patient by phone, patient unable to go for covid test at [facility] requests rapid same day covid test, patient informed she could not be rapid same day covid test due to she has 730 am surgery on 01-08-2021. Spoke with [facility] and patient can come for covid test at [facility] on 01-07-2021 at 800 am, patient aware of scheduled covid test 800 am 01-07-2021 at [facility]. Patient hung up phone and did not take pre op instructions or medical history update for 01-08-2021 surgery. Called back and left address and arrival time 530 am npo after midnight for 01-08-2021 surgery and to call [phone] if questions on patient voice mail.

## 2021-01-03 NOTE — H&P (Signed)
[name] is an 35 y.o. female who underwent medical induction of missed AB. Follow up U/S revealed probable retained products of conception. Surgerical management recommended to pt. D & C reviewed with pt.    Menstrual History: Menarche age: 88 Patient's last menstrual period was 09/14/2020.    Past Medical History:  Diagnosis Date  . Anxiety   . Asthma    inhaler PRN-hasn't used inhaler in months  . Attention deficit hyperactivity disorder   . Bipolar disorder [facility])    hospitalized as a teen for suicidal ideations and cutting  . Depression    no complaints, doing well  . Gastritis   . GERD (gastroesophageal reflux disease)   . History of gestational diabetes in prior pregnancy, currently pregnant 10/13/2017  . History of preterm delivery after IOL for preeclampsia, currently pregnant 10/13/2017   Not a 17P candidate  . Hx of varicella   . Mild preeclampsia 02/09/2016  . Vaginal Pap smear, abnormal     Past Surgical History:  Procedure Laterality Date  . EYE MUSCLE SURGERY Bilateral    07/29/12  . MOUTH SURGERY    . PLANTAR FASCIA SURGERY    . WISDOM TOOTH EXTRACTION      Family History  Problem Relation Age of Onset  . Depression Mother   . Colon cancer Mother   . Colon polyps Mother   . Depression Father   . Lung cancer Father   . Depression Brother   . Liver disease Other   . Kidney disease Other     Social History:  reports that she has never smoked. She has never used smokeless tobacco. She reports that she does not drink alcohol and does not use drugs.  Allergies:  Allergies  Allergen Reactions  . Depakote [Divalproex Sodium] Other (See Comments)    Pt states that this medication makes her blood levels toxic.    [name] Methylphenidate Derivatives Other (See Comments)    Reaction:  Depression and anger   . Neurontin [Gabapentin] Other (See Comments)    Reaction:  Dizziness   . Prozac [Fluoxetine Hcl] Other (See Comments)    Reaction:  Anger   .  Methylphenidate Hcl     SAME AS PREVIOUS LISTED METHYLPHENIDATE    No medications prior to admission.    Review of Systems  Constitutional: Negative.   Cardiovascular: Negative.   Gastrointestinal: Negative.   Genitourinary: Negative.     Height 5\' 3"  (1.6 m), weight 108.9 kg, last menstrual period 09/14/2020, unknown if currently breastfeeding. Physical Exam Constitutional:      Appearance: Normal appearance.  Cardiovascular:     Rate and Rhythm: Normal rate and regular rhythm.  Pulmonary:     Effort: Pulmonary effort is normal.     Breath sounds: Normal breath sounds.  Abdominal:     General: Bowel sounds are normal.     Palpations: Abdomen is soft.  Genitourinary:    Comments: Nl EGBUS, uterus < 8 weeks, mobile, no masses or tenderness Neurological:     Mental Status: She is alert.     No results found for this or any previous visit (from the past 24 hour(s)).  No results found.  Assessment/Plan: Retained Products of conception  For D & C for retained products of conception. R/B/post op care reviewed with pt. Pt agrees to proceed.   Chancy Milroy 01/03/2021, 5:03 PM

## 2021-01-04 NOTE — Progress Notes (Signed)
Patient called and stated she has arrnaged [employer] transportation for covid test 800 am Monday 01-07-2021 at [place], patient also stated [place] transportation would bring her and her husband day of surgery and transport pt home with spouse, then patient hung up phone.

## 2021-01-07 ENCOUNTER — Telehealth: Payer: Self-pay | Admitting: Family Medicine

## 2021-01-07 DIAGNOSIS — Z20822 Contact with and (suspected) exposure to covid-19: Secondary | ICD-10-CM | POA: Diagnosis not present

## 2021-01-07 DIAGNOSIS — Z801 Family history of malignant neoplasm of trachea, bronchus and lung: Secondary | ICD-10-CM | POA: Diagnosis not present

## 2021-01-07 DIAGNOSIS — Z800 Family history of malignant neoplasm of digestive organs: Secondary | ICD-10-CM | POA: Diagnosis not present

## 2021-01-07 DIAGNOSIS — Z818 Family history of other mental and behavioral disorders: Secondary | ICD-10-CM | POA: Diagnosis not present

## 2021-01-07 DIAGNOSIS — O99342 Other mental disorders complicating pregnancy, second trimester: Secondary | ICD-10-CM | POA: Diagnosis not present

## 2021-01-07 DIAGNOSIS — F319 Bipolar disorder, unspecified: Secondary | ICD-10-CM | POA: Diagnosis not present

## 2021-01-07 DIAGNOSIS — Z8371 Family history of colonic polyps: Secondary | ICD-10-CM | POA: Diagnosis not present

## 2021-01-07 DIAGNOSIS — Z841 Family history of disorders of kidney and ureter: Secondary | ICD-10-CM | POA: Diagnosis not present

## 2021-01-07 DIAGNOSIS — Z3A16 16 weeks gestation of pregnancy: Secondary | ICD-10-CM | POA: Diagnosis not present

## 2021-01-07 DIAGNOSIS — Z888 Allergy status to other drugs, medicaments and biological substances status: Secondary | ICD-10-CM | POA: Diagnosis not present

## 2021-01-07 DIAGNOSIS — O021 Missed abortion: Secondary | ICD-10-CM | POA: Diagnosis present

## 2021-01-07 LAB — CULTURE, BLOOD (ROUTINE X 2)
Culture: NO GROWTH
Special Requests: ADEQUATE

## 2021-01-07 LAB — SARS CORONAVIRUS 2 BY RT PCR (HOSPITAL ORDER, PERFORMED IN ~~LOC~~ HOSPITAL LAB): SARS Coronavirus 2: NEGATIVE

## 2021-01-07 NOTE — Anesthesia Preprocedure Evaluation (Addendum)
Anesthesia Evaluation  Patient identified by MRN, date of birth, ID band Patient awake    Reviewed: Allergy & Precautions, NPO status , Patient's Chart, lab work & pertinent test results  Airway Mallampati: II  TM Distance: >3 FB Neck ROM: Full    Dental  (+) Dental Advisory Given, Edentulous Upper, Partial Lower   Pulmonary asthma , sleep apnea ,    Pulmonary exam normal breath sounds clear to auscultation       Cardiovascular hypertension, Normal cardiovascular exam Rhythm:Regular Rate:Normal     Neuro/Psych PSYCHIATRIC DISORDERS Anxiety Depression Bipolar Disorder negative neurological ROS     GI/Hepatic Neg liver ROS, GERD  Medicated and Controlled,  Endo/Other  Morbid obesity (BMI 44)  Renal/GU negative Renal ROS  negative genitourinary   Musculoskeletal negative musculoskeletal ROS (+)   Abdominal (+) + obese,   Peds  (+) ADHD Hematology negative hematology ROS (+)   Anesthesia Other Findings IOL for gHTN  Reproductive/Obstetrics                           Anesthesia Physical  Anesthesia Plan  ASA: III  Anesthesia Plan: General   Post-op Pain Management:    Induction: Intravenous  PONV Risk Score and Plan: 4 or greater and Ondansetron, Dexamethasone, Midazolam, Scopolamine patch - Pre-op and Treatment may vary due to age or medical condition  Airway Management Planned: LMA  Additional Equipment: None  Intra-op Plan:   Post-operative Plan: Extubation in OR  Informed Consent: I have reviewed the patients History and Physical, chart, labs and discussed the procedure including the risks, benefits and alternatives for the proposed anesthesia with the patient or authorized representative who has indicated his/her understanding and acceptance.     Dental advisory given  Plan Discussed with: CRNA  Anesthesia Plan Comments:      Anesthesia Quick Evaluation

## 2021-01-07 NOTE — Telephone Encounter (Signed)
Call returned to pt and discussed her concern. She stated that she had some spotting yesterday. She has no bleeding today.  Pt was advised that she will still need the surgery tomorrow as scheduled. Pt voiced understanding and also confirmed that she received the Mychart message that I sent earlier today. Pt asked if she can get IUD placed tomorrow during surgery. I advised that I will let Dr. Rip Harbour know of her question and he will talk with her about it tomorrow. She voiced understanding and stated that she will arrive for surgery tomorrow as scheduled.

## 2021-01-07 NOTE — Telephone Encounter (Signed)
Pt wants to know if an ultrasound will be done prior to surg to confirm that she still needs surg b/c she states she is on her cycle now. Pt request a call back today. Toya Smothers

## 2021-01-08 ENCOUNTER — Ambulatory Visit (HOSPITAL_BASED_OUTPATIENT_CLINIC_OR_DEPARTMENT_OTHER): Payer: 59 | Admitting: Anesthesiology

## 2021-01-08 ENCOUNTER — Encounter (HOSPITAL_BASED_OUTPATIENT_CLINIC_OR_DEPARTMENT_OTHER): Payer: Self-pay | Admitting: Obstetrics and Gynecology

## 2021-01-08 ENCOUNTER — Ambulatory Visit (HOSPITAL_BASED_OUTPATIENT_CLINIC_OR_DEPARTMENT_OTHER)
Admission: RE | Admit: 2021-01-08 | Discharge: 2021-01-08 | Disposition: A | Discharge status: Home / Self Care | Payer: 59 | Attending: Obstetrics and Gynecology | Admitting: Obstetrics and Gynecology

## 2021-01-08 ENCOUNTER — Encounter (HOSPITAL_BASED_OUTPATIENT_CLINIC_OR_DEPARTMENT_OTHER)
Admission: RE | Disposition: A | Discharge status: Home / Self Care | Payer: Self-pay | Source: Home / Self Care | Attending: Obstetrics and Gynecology

## 2021-01-08 DIAGNOSIS — O99342 Other mental disorders complicating pregnancy, second trimester: Secondary | ICD-10-CM | POA: Insufficient documentation

## 2021-01-08 DIAGNOSIS — Z8371 Family history of colonic polyps: Secondary | ICD-10-CM | POA: Insufficient documentation

## 2021-01-08 DIAGNOSIS — Z3A16 16 weeks gestation of pregnancy: Secondary | ICD-10-CM | POA: Diagnosis not present

## 2021-01-08 DIAGNOSIS — Z818 Family history of other mental and behavioral disorders: Secondary | ICD-10-CM | POA: Insufficient documentation

## 2021-01-08 DIAGNOSIS — Z800 Family history of malignant neoplasm of digestive organs: Secondary | ICD-10-CM | POA: Insufficient documentation

## 2021-01-08 DIAGNOSIS — Z888 Allergy status to other drugs, medicaments and biological substances status: Secondary | ICD-10-CM | POA: Insufficient documentation

## 2021-01-08 DIAGNOSIS — Z20822 Contact with and (suspected) exposure to covid-19: Secondary | ICD-10-CM | POA: Diagnosis not present

## 2021-01-08 DIAGNOSIS — O021 Missed abortion: Secondary | ICD-10-CM | POA: Diagnosis not present

## 2021-01-08 DIAGNOSIS — Z801 Family history of malignant neoplasm of trachea, bronchus and lung: Secondary | ICD-10-CM | POA: Insufficient documentation

## 2021-01-08 DIAGNOSIS — F319 Bipolar disorder, unspecified: Secondary | ICD-10-CM | POA: Insufficient documentation

## 2021-01-08 DIAGNOSIS — Z841 Family history of disorders of kidney and ureter: Secondary | ICD-10-CM | POA: Insufficient documentation

## 2021-01-08 HISTORY — PX: DILATION AND EVACUATION: SHX1459

## 2021-01-08 LAB — CBC
HCT: 40.3 % (ref 36.0–46.0)
Hemoglobin: 12.5 g/dL (ref 12.0–15.0)
MCH: 28.7 pg (ref 26.0–34.0)
MCHC: 31 g/dL (ref 30.0–36.0)
MCV: 92.6 fL (ref 80.0–100.0)
Platelets: 206 10*3/uL (ref 150–400)
RBC: 4.35 MIL/uL (ref 3.87–5.11)
RDW: 14.4 % (ref 11.5–15.5)
WBC: 5.4 10*3/uL (ref 4.0–10.5)
nRBC: 0 % (ref 0.0–0.2)

## 2021-01-08 SURGERY — DILATION AND EVACUATION, UTERUS
Anesthesia: General | Site: Vagina

## 2021-01-08 MED ORDER — ACETAMINOPHEN 500 MG PO TABS
ORAL_TABLET | ORAL | Status: AC
  Filled 2021-01-08: qty 2

## 2021-01-08 MED ORDER — FENTANYL CITRATE (PF) 250 MCG/5ML IJ SOLN
INTRAMUSCULAR | Status: DC | PRN
  Administered 2021-01-08: 100 ug via INTRAVENOUS

## 2021-01-08 MED ORDER — FERRIC SUBSULFATE 259 MG/GM EX SOLN
CUTANEOUS | Status: AC
  Filled 2021-01-08: qty 8

## 2021-01-08 MED ORDER — IBUPROFEN 800 MG PO TABS: 800.0000 mg | ORAL_TABLET | Freq: Three times a day (TID) | ORAL | 0 refills | Status: DC | PRN

## 2021-01-08 MED ORDER — FENTANYL CITRATE (PF) 100 MCG/2ML IJ SOLN
25.0000 ug | INTRAMUSCULAR | Status: DC | PRN
  Administered 2021-01-08: 25 ug via INTRAVENOUS

## 2021-01-08 MED ORDER — FERRIC SUBSULFATE SOLN
Status: DC | PRN
  Administered 2021-01-08: 1

## 2021-01-08 MED ORDER — SOD CITRATE-CITRIC ACID 500-334 MG/5ML PO SOLN: 30.0000 mL | ORAL | Status: DC

## 2021-01-08 MED ORDER — DEXAMETHASONE SODIUM PHOSPHATE 10 MG/ML IJ SOLN
INTRAMUSCULAR | Status: DC | PRN
  Administered 2021-01-08: 10 mg via INTRAVENOUS

## 2021-01-08 MED ORDER — ACETAMINOPHEN 500 MG PO TABS
1000.0000 mg | ORAL_TABLET | ORAL | Status: AC
  Administered 2021-01-08: 1000 mg via ORAL

## 2021-01-08 MED ORDER — ONDANSETRON HCL 4 MG/2ML IJ SOLN
INTRAMUSCULAR | Status: AC
  Filled 2021-01-08: qty 2

## 2021-01-08 MED ORDER — MIDAZOLAM HCL 2 MG/2ML IJ SOLN
INTRAMUSCULAR | Status: AC
  Filled 2021-01-08: qty 2

## 2021-01-08 MED ORDER — AMISULPRIDE (ANTIEMETIC) 5 MG/2ML IV SOLN: 10.0000 mg | Freq: Once | INTRAVENOUS | Status: DC | PRN

## 2021-01-08 MED ORDER — ONDANSETRON HCL 4 MG/2ML IJ SOLN
INTRAMUSCULAR | Status: DC | PRN
  Administered 2021-01-08: 4 mg via INTRAVENOUS

## 2021-01-08 MED ORDER — KETOROLAC TROMETHAMINE 15 MG/ML IJ SOLN: 15.0000 mg | INTRAMUSCULAR | Status: DC

## 2021-01-08 MED ORDER — FENTANYL CITRATE (PF) 100 MCG/2ML IJ SOLN
INTRAMUSCULAR | Status: AC
  Filled 2021-01-08: qty 2

## 2021-01-08 MED ORDER — KETOROLAC TROMETHAMINE 15 MG/ML IJ SOLN
INTRAMUSCULAR | Status: AC
  Filled 2021-01-08: qty 1

## 2021-01-08 MED ORDER — POVIDONE-IODINE 10 % EX SWAB: 2.0000 "application " | Freq: Once | CUTANEOUS | Status: DC

## 2021-01-08 MED ORDER — KETOROLAC TROMETHAMINE 15 MG/ML IJ SOLN
15.0000 mg | Freq: Four times a day (QID) | INTRAMUSCULAR | Status: AC | PRN
Start: 2021-01-08 — End: 2021-01-08
  Administered 2021-01-08: 15 mg via INTRAVENOUS

## 2021-01-08 MED ORDER — LACTATED RINGERS IV SOLN: INTRAVENOUS | Status: DC

## 2021-01-08 MED ORDER — PROPOFOL 10 MG/ML IV BOLUS
INTRAVENOUS | Status: AC
  Filled 2021-01-08: qty 40

## 2021-01-08 MED ORDER — LIDOCAINE 2% (20 MG/ML) 5 ML SYRINGE
INTRAMUSCULAR | Status: DC | PRN
  Administered 2021-01-08: 100 mg via INTRAVENOUS

## 2021-01-08 MED ORDER — 0.9 % SODIUM CHLORIDE (POUR BTL) OPTIME
TOPICAL | Status: DC | PRN
  Administered 2021-01-08: 1000 mL

## 2021-01-08 MED ORDER — LEVONORGESTREL 19.5 MCG/DAY IU IUD: INTRAUTERINE_SYSTEM | INTRAUTERINE | Status: DC

## 2021-01-08 MED ORDER — OXYCODONE HCL 5 MG PO TABS: 5.0000 mg | ORAL_TABLET | Freq: Four times a day (QID) | ORAL | 0 refills | Status: DC | PRN

## 2021-01-08 MED ORDER — PROPOFOL 10 MG/ML IV BOLUS
INTRAVENOUS | Status: DC | PRN
  Administered 2021-01-08: 200 mg via INTRAVENOUS

## 2021-01-08 MED ORDER — DOXYCYCLINE HYCLATE 100 MG IV SOLR
200.0000 mg | INTRAVENOUS | Status: AC
  Administered 2021-01-08: 200 mg via INTRAVENOUS
  Filled 2021-01-08: qty 200

## 2021-01-08 MED ORDER — MIDAZOLAM HCL 5 MG/5ML IJ SOLN
INTRAMUSCULAR | Status: DC | PRN
  Administered 2021-01-08: 2 mg via INTRAVENOUS

## 2021-01-08 MED ORDER — DEXAMETHASONE SODIUM PHOSPHATE 10 MG/ML IJ SOLN
INTRAMUSCULAR | Status: AC
  Filled 2021-01-08: qty 1

## 2021-01-08 SURGICAL SUPPLY — 21 items
CATH ROBINSON RED A/P 16FR (CATHETERS) ×2 IMPLANT
DECANTER SPIKE VIAL GLASS SM (MISCELLANEOUS) ×2 IMPLANT
GLOVE BIO SURGEON STRL SZ7.5 (GLOVE) ×2 IMPLANT
GLOVE SURG UNDER POLY LF SZ7 (GLOVE) ×2 IMPLANT
GOWN STRL REUS W/ TWL LRG LVL3 (GOWN DISPOSABLE) ×1 IMPLANT
GOWN STRL REUS W/ TWL XL LVL3 (GOWN DISPOSABLE) ×1 IMPLANT
GOWN STRL REUS W/TWL LRG LVL3 (GOWN DISPOSABLE) ×2
GOWN STRL REUS W/TWL XL LVL3 (GOWN DISPOSABLE) ×2
HIBICLENS CHG 4% 4OZ (MISCELLANEOUS) ×2 IMPLANT
KIT BERKELEY 1ST TRIMESTER 3/8 (MISCELLANEOUS) ×4 IMPLANT
KIT TURNOVER CYSTO (KITS) ×2 IMPLANT
NS IRRIG 1000ML POUR BTL (IV SOLUTION) ×2 IMPLANT
PACK VAGINAL MINOR WOMEN LF (CUSTOM PROCEDURE TRAY) ×2 IMPLANT
PAD OB MATERNITY 4.3X12.25 (PERSONAL CARE ITEMS) ×2 IMPLANT
PAD PREP 24X48 CUFFED NSTRL (MISCELLANEOUS) ×2 IMPLANT
SET BERKELEY SUCTION TUBING (SUCTIONS) ×2 IMPLANT
TOWEL OR 17X26 10 PK STRL BLUE (TOWEL DISPOSABLE) ×4 IMPLANT
VACURETTE 10 RIGID CVD (CANNULA) IMPLANT
VACURETTE 7MM CVD STRL WRAP (CANNULA) IMPLANT
VACURETTE 8 RIGID CVD (CANNULA) ×2 IMPLANT
VACURETTE 9 RIGID CVD (CANNULA) IMPLANT

## 2021-01-08 NOTE — Interval H&P Note (Signed)
History and Physical Interval Note:  01/08/2021 7:07 AM  [name]  has presented today for surgery, with the diagnosis of RETAINED POC.  The various methods of treatment have been discussed with the patient and family. After consideration of risks, benefits and other options for treatment, the patient has consented to  Procedure(s): DILATATION AND EVACUATION (N/A) as a surgical intervention.  The patient's history has been reviewed, patient examined, no change in status, stable for surgery.  I have reviewed the patient's chart and labs.  Questions were answered to the patient's satisfaction.     [name]

## 2021-01-08 NOTE — Transfer of Care (Signed)
Immediate Anesthesia Transfer of Care Note  Patient: [name]  Procedure(s) Performed: DILATATION AND EVACUATION (N/A Vagina )  Patient Location: PACU  Anesthesia Type:General  Level of Consciousness: awake, alert , oriented and patient cooperative  Airway & Oxygen Therapy: Patient Spontanous Breathing and Patient connected to face mask oxygen  Post-op Assessment: Report given to RN, Post -op Vital signs reviewed and stable and Patient moving all extremities  Post vital signs: Reviewed and stable  Last Vitals:  Vitals Value Taken Time  BP    Temp    Pulse    Resp    SpO2      Last Pain:  Vitals:   01/08/21 0614  TempSrc: Oral  PainSc: 0-No pain      Patients Stated Pain Goal: 5 (13/24/40 1027)  Complications: No complications documented.

## 2021-01-08 NOTE — Op Note (Signed)
[name] PROCEDURE DATE: 01/08/2021  PREOPERATIVE DIAGNOSIS: Retained Products of contception POSTOPERATIVE DIAGNOSIS: The same PROCEDURE:     Dilation and Evacuation SURGEON:  Dr. Legrand Como L. Anaiza Behrens  INDICATIONS: 35 y.o. [id number] with retained products of contception needing surgical completion.  Risks of surgery were discussed with the patient including but not limited to: bleeding which may require transfusion; infection which may require antibiotics; injury to uterus or surrounding organs; need for additional procedures including laparotomy or laparoscopy; possibility of intrauterine scarring which may impair future fertility; and other postoperative/anesthesia complications. Written informed consent was obtained.    FINDINGS:  A 8 week size uterus, moderate amounts of products of conception, specimen sent to pathology.  ANESTHESIA:    Monitored intravenous sedation, paracervical block. INTRAVENOUS FLUIDS:  As recorded ESTIMATED BLOOD LOSS:  50 ml. SPECIMENS:  Products of conception sent to pathology COMPLICATIONS:  None immediate.  PROCEDURE DETAILS:  The patient received intravenous Doxycycline while in the preoperative area.  She was then taken to the operating room where monitored intravenous sedation was administered and was found to be adequate.  After an adequate timeout was performed, she was placed in the dorsal lithotomy position and examined; then prepped and draped in the sterile manner.   Her bladder was catheterized for an unmeasured amount of clear, yellow urine. A vaginal speculum was then placed in the patient's vagina and a single tooth tenaculum was applied to the anterior lip of the cervix. The cervix was gently dilated to accommodate a 8 mm suction curette that was gently advanced to the uterine fundus.  The suction device was then activated and curette slowly rotated to clear the uterus of products of conception.  A sharp curettage was then performed to confirm  complete emptying of the uterus. There was minimal bleeding noted and the tenaculum removed with good hemostasis noted.   All instruments were removed from the patient's vagina.  Sponge and instrument counts were correct times two  The patient tolerated the procedure well and was taken to the recovery area awake, and in stable condition.  The patient will be discharged to home as per PACU criteria.  Routine postoperative instructions given.   [name], MD, [place] Attending [place], [facility]

## 2021-01-08 NOTE — Anesthesia Procedure Notes (Signed)
Procedure Name: LMA Insertion Date/Time: 01/08/2021 7:27 AM Performed by: Myna Bright, CRNA Pre-anesthesia Checklist: Patient identified, Emergency Drugs available, Suction available and Patient being monitored Patient Re-evaluated:Patient Re-evaluated prior to induction Oxygen Delivery Method: Circle system utilized Preoxygenation: Pre-oxygenation with 100% oxygen Induction Type: IV induction Ventilation: Mask ventilation without difficulty LMA: LMA flexible inserted LMA Size: 4.0 Number of attempts: 1 Placement Confirmation: positive ETCO2 and breath sounds checked- equal and bilateral Tube secured with: Tape Dental Injury: Teeth and Oropharynx as per pre-operative assessment

## 2021-01-08 NOTE — Discharge Instructions (Signed)
Post Anesthesia Home Care Instructions  Activity: Get plenty of rest for the remainder of the day. A responsible individual must stay with you for 24 hours following the procedure.  For the next 24 hours, DO NOT: -Drive a car -Paediatric nurse -Drink alcoholic beverages -Take any medication unless instructed by your physician -Make any legal decisions or sign important papers.  Meals: Start with liquid foods such as gelatin or soup. Progress to regular foods as tolerated. Avoid greasy, spicy, heavy foods. If nausea and/or vomiting occur, drink only clear liquids until the nausea and/or vomiting subsides. Call your physician if vomiting continues.  Special Instructions/Symptoms: Your throat may feel dry or sore from the anesthesia or the breathing tube placed in your throat during surgery. If this causes discomfort, gargle with warm salt water. The discomfort should disappear within 24 hours.  If you had a scopolamine patch placed behind your ear for the management of post- operative nausea and/or vomiting:  1. The medication in the patch is effective for 72 hours, after which it should be removed.  Wrap patch in a tissue and discard in the trash. Wash hands thoroughly with soap and water. 2. You may remove the patch earlier than 72 hours if you experience unpleasant side effects which may include dry mouth, dizziness or visual disturbances. 3. Avoid touching the patch. Wash your hands with soap and water after contact with the patch.    Dilation and Curettage or Vacuum Curettage, Care After This sheet gives you information about how to care for yourself after your procedure. Your health care provider may also give you more specific instructions. If you have problems or questions, contact your health care provider. What can I expect after the procedure? After the procedure, it is common to have:  Mild pain or cramping.  Some vaginal bleeding or spotting. These may last for up to 2  weeks after your procedure. Follow these instructions at home: Medicines  Take over-the-counter and prescription medicines only as told by your health care provider. This is especially important if you take blood-thinning medicine.  Ask your health care provider if the medicine prescribed to you requires you to avoid driving or using machinery. Activity  If you were given a sedative during the procedure, it can affect you for several hours. Do not drive or operate machinery until your health care provider says that it is safe.  Rest as told by your health care provider.  Avoid sitting for a long time without moving. Get up to take short walks every 1-2 hours. This is important to improve blood flow and breathing. Ask for help if you feel weak or unsteady.  Do not lift anything that is heavier than 10 lb (4.5 kg), or the limit that you are told, until your health care provider says that it is safe.  Return to your normal activities as told by your health care provider. Ask your health care provider what activities are safe for you.   Lifestyle For at least 2 weeks, or as long as told by your health care provider, do not:  Douche.  Use tampons.  Have sex. General instructions  Wear compression stockings as told by your health care provider. These stockings help to prevent blood clots and reduce swelling in your legs.  It is up to you to get the results of your procedure. Ask your health care provider, or the department that is doing the procedure, when your results will be ready.  Keep all follow-up visits  as told by your health care provider. This is important. Contact a health care provider if:  You have severe cramps that get worse or that do not get better with medicine.  You have severe pain in the abdomen.  You cannot drink fluids without vomiting.  You develop pain in a different area of your pelvis.  You have bad-smelling discharge from the vagina.  You have a  rash. Get help right away if:  You have vaginal bleeding that soaks more than one sanitary pad in 1 hour for 2 hours in a row, or you pass large clots from your vagina.  You have a fever that is above 100.26F (38.0C).  Your abdomen feels very tender or hard.  You have chest pain.  You have shortness of breath.  You feel dizzy or light-headed, or you faint.  You have pain in your neck or shoulder area. These symptoms may represent a serious problem that is an emergency. Do not wait to see if the symptoms will go away. Get medical help right away. Call your local emergency services (911 in the [place] Do not drive yourself to the hospital. Summary  After your procedure, it is common to have mild pain and bleeding or spotting that may last for up to 2 weeks.  Rest as told. Avoid sitting for a long time without moving. Get up to take short walks every 1-2 hours.  Do not lift anything that is heavier than 10 lb (4.5 kg), or the limit that you are told.  Monitor yourself for any complications that may develop after the procedure.  Keep all follow-up visits as told by your health care provider. Know the symptoms for which you should get help right away. This information is not intended to replace advice given to you by your health care provider. Make sure you discuss any questions you have with your health care provider. Document Revised: 01/03/2020 Document Reviewed: 01/03/2020 Elsevier Patient Education  2021 [employer].

## 2021-01-08 NOTE — Anesthesia Postprocedure Evaluation (Signed)
Anesthesia Post Note  Patient: [name]  Procedure(s) Performed: DILATATION AND EVACUATION (N/A Vagina )     Patient location during evaluation: PACU Anesthesia Type: General Level of consciousness: sedated and patient cooperative Pain management: pain level controlled Vital Signs Assessment: post-procedure vital signs reviewed and stable Respiratory status: spontaneous breathing Cardiovascular status: stable Anesthetic complications: no   No complications documented.  Last Vitals:  Vitals:   01/08/21 0845 01/08/21 0908  BP: (!) 148/80 136/80  Pulse:  68  Resp:    Temp:    SpO2: 95% 100%    Last Pain:  Vitals:   01/08/21 0908  TempSrc:   PainSc: 0-No pain                 Nolon Nations

## 2021-01-09 ENCOUNTER — Encounter (HOSPITAL_BASED_OUTPATIENT_CLINIC_OR_DEPARTMENT_OTHER): Payer: Self-pay | Admitting: Obstetrics and Gynecology

## 2021-01-09 LAB — SURGICAL PATHOLOGY

## 2021-01-15 ENCOUNTER — Other Ambulatory Visit: Payer: Self-pay | Admitting: Obstetrics and Gynecology

## 2021-01-16 ENCOUNTER — Telehealth: Payer: Self-pay | Admitting: Family Medicine

## 2021-01-16 DIAGNOSIS — Z3200 Encounter for pregnancy test, result unknown: Secondary | ICD-10-CM

## 2021-01-16 NOTE — Telephone Encounter (Signed)
Patient is calling requesting a HCG level check,

## 2021-01-16 NOTE — Telephone Encounter (Addendum)
I called pt and discussed her request. She stated that she had unprotected sex on 1/31. She thought this was ok since she had stopped bleeding from her surgery on 1/25. She believes she is ovulating and wants to know if she became pregnant from the sex on 1/31. She is requesting BHCG test in about 1.5 weeks because she has read that a pregnancy can be detected that soon after conception. I advised pt that we do not recommend having sex so soon after the surgery or becoming pregnant until after the first normal period following surgery. Pt was informed also that we typically do not perform pregnancy testing unless a woman has a missed period, has sx of pregnancy or is having complications such as heavy vaginal bleeding and severe cramping. Pt was persistent in that she wanted a BHCG test done. I advised that I will forward the details of her request to Dr. Rip Harbour in order to get his recommendation. She voiced understanding.   2/3  1330  Per Dr. Rip Harbour, pt may have non-stat BHCG on 2/8. She may discuss results during her video visit scheduled on 2/9. Mychart message was sent to pt and lab appt will be scheduled.

## 2021-01-17 ENCOUNTER — Encounter: Payer: Self-pay | Admitting: *Deleted

## 2021-01-17 NOTE — Addendum Note (Signed)
Addended by: Langston Reusing on: 01/17/2021 03:36 PM   Modules accepted: Orders

## 2021-01-21 ENCOUNTER — Other Ambulatory Visit: Payer: Self-pay

## 2021-01-21 ENCOUNTER — Other Ambulatory Visit: Payer: 59

## 2021-01-21 DIAGNOSIS — Z3200 Encounter for pregnancy test, result unknown: Secondary | ICD-10-CM

## 2021-01-22 ENCOUNTER — Other Ambulatory Visit: Payer: 59

## 2021-01-22 LAB — BETA HCG QUANT (REF LAB): hCG Quant: <1 m[IU]/mL

## 2021-01-23 ENCOUNTER — Telehealth: Payer: Self-pay | Admitting: Family Medicine

## 2021-01-23 ENCOUNTER — Telehealth: Payer: 59 | Admitting: Family Medicine

## 2021-01-23 DIAGNOSIS — Z5329 Procedure and treatment not carried out because of patient's decision for other reasons: Secondary | ICD-10-CM

## 2021-01-23 NOTE — Progress Notes (Signed)
No show for visit. Needs to be rescheduled for in person and discussion of contraception.

## 2021-01-23 NOTE — Progress Notes (Signed)
Morey Hummingbird, RN called pt with no answer and left message, sent video link.  No answer on phone and left message to return call to reschedule appt.  Dr Dione Plover aware. Will send pt mychart message.

## 2021-01-23 NOTE — Telephone Encounter (Signed)
I called pt back to resch missed appt, no answer, no voicemail

## 2021-03-25 ENCOUNTER — Encounter (HOSPITAL_BASED_OUTPATIENT_CLINIC_OR_DEPARTMENT_OTHER): Payer: Self-pay

## 2021-03-26 ENCOUNTER — Other Ambulatory Visit (HOSPITAL_BASED_OUTPATIENT_CLINIC_OR_DEPARTMENT_OTHER): Payer: Self-pay | Admitting: Nurse Practitioner

## 2021-03-27 ENCOUNTER — Emergency Department (HOSPITAL_BASED_OUTPATIENT_CLINIC_OR_DEPARTMENT_OTHER): Payer: 59

## 2021-03-27 ENCOUNTER — Encounter (HOSPITAL_BASED_OUTPATIENT_CLINIC_OR_DEPARTMENT_OTHER): Payer: Self-pay

## 2021-03-27 ENCOUNTER — Other Ambulatory Visit: Payer: Self-pay

## 2021-03-27 ENCOUNTER — Emergency Department (HOSPITAL_BASED_OUTPATIENT_CLINIC_OR_DEPARTMENT_OTHER)
Admission: EM | Admit: 2021-03-27 | Discharge: 2021-03-27 | Disposition: A | Discharge status: Home / Self Care | Payer: 59 | Attending: Emergency Medicine | Admitting: Emergency Medicine

## 2021-03-27 DIAGNOSIS — Z3200 Encounter for pregnancy test, result unknown: Secondary | ICD-10-CM | POA: Insufficient documentation

## 2021-03-27 DIAGNOSIS — J45909 Unspecified asthma, uncomplicated: Secondary | ICD-10-CM | POA: Insufficient documentation

## 2021-03-27 DIAGNOSIS — Y92810 Car as the place of occurrence of the external cause: Secondary | ICD-10-CM | POA: Diagnosis not present

## 2021-03-27 DIAGNOSIS — X501XXA Overexertion from prolonged static or awkward postures, initial encounter: Secondary | ICD-10-CM | POA: Insufficient documentation

## 2021-03-27 DIAGNOSIS — M25561 Pain in right knee: Secondary | ICD-10-CM | POA: Diagnosis present

## 2021-03-27 LAB — HCG, SERUM, QUALITATIVE: Preg, Serum: NEGATIVE

## 2021-03-27 MED ORDER — ACETAMINOPHEN 325 MG PO TABS
650.0000 mg | ORAL_TABLET | Freq: Once | ORAL | Status: AC
  Administered 2021-03-27: 650 mg via ORAL
  Filled 2021-03-27: qty 2

## 2021-03-27 NOTE — ED Triage Notes (Signed)
R knee pain x 1 hour PTA.  Pt states she was getting out of her car and "twisted or turned wrong and heard a pop".  Hx of knee dislocation per pt but has never needed medical attention for this.

## 2021-03-27 NOTE — ED Provider Notes (Signed)
[facility] EMERGENCY DEPARTMENT Provider Note   CSN: [id number] Arrival date & time: 03/27/21  1056     History Chief Complaint  Patient presents with  . Knee Injury    [name] is a 35 y.o. female.  HPI   35 year old female history of anxiety, asthma, ADHD, bipolar disorder, depression, gastritis, GERD, who presents to the emergency department today for evaluation of knee pain.  States that she has been having chronic issues with the right knee for some time now however today when she was getting out of her car she felt a pop and worse pain to the medial aspect of her right knee.  She denies any other injuries.  She further states that she thinks she is pregnant and is requesting a pregnancy test.  She took 1 at home and states that it was positive.  She denies any pelvic pain or vaginal bleeding.  Past Medical History:  Diagnosis Date  . Anxiety   . Asthma    inhaler PRN-hasn't used inhaler in months  . Attention deficit hyperactivity disorder   . Bipolar disorder [facility])    hospitalized as a teen for suicidal ideations and cutting  . Depression    no complaints, doing well  . Gastritis   . GERD (gastroesophageal reflux disease)   . History of gestational diabetes in prior pregnancy, currently pregnant 10/13/2017  . History of preterm delivery after IOL for preeclampsia, currently pregnant 10/13/2017   Not a 17P candidate  . Hx of varicella   . Mild preeclampsia 02/09/2016  . Vaginal Pap smear, abnormal     Patient Active Problem List   Diagnosis Date Noted  . Retained products of conception after miscarriage 12/27/2020  . Nexplanon insertion 03/17/2020  . Nausea and vomiting during pregnancy 10/04/2019  . 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 Anxiety state 03/27/2014  . OSA (obstructive sleep apnea) 09/15/2013  . Asthma 05/21/2013  . 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 Intellectual disability 09/15/2012  . Obesity (BMI 30-39.9) 08/03/2012  . Bipolar affective disorder, depressed, moderate degree ([place]) 06/02/2012    Class: Acute  . GERD (gastroesophageal reflux disease) 05/19/2012  . Borderline behavior 04/12/2012    Class: Acute    Past Surgical History:  Procedure Laterality Date  . DILATION AND EVACUATION N/A 01/08/2021   Procedure: DILATATION AND EVACUATION;  Surgeon: Chancy Milroy, MD;  Location: [facility];  Service: Gynecology;  Laterality: N/A;  . EYE MUSCLE SURGERY Bilateral    07/29/12  . MOUTH SURGERY    . PLANTAR FASCIA SURGERY    . WISDOM TOOTH EXTRACTION       OB History    Gravida  7   Para  5   Term  4   Preterm  1   AB  1   Living  5     SAB  1   IAB      Ectopic      Multiple  0   Live Births  5           Family History  Problem Relation Age of Onset  . Depression Mother   . Colon cancer Mother   . Colon polyps Mother   . Depression Father   . Lung cancer Father   . Depression Brother   . Liver disease Other   . Kidney disease Other     Social History   Tobacco Use  .  Smoking status: Never Smoker  . Smokeless tobacco: Never Used  Vaping Use  . Vaping Use: Never used  Substance Use Topics  . Alcohol use: No    Alcohol/week: 0.0 standard drinks  . Drug use: No    Home Medications Prior to Admission medications   Medication Sig Start Date End Date Taking? Authorizing Provider  acetaminophen (TYLENOL) 500 MG tablet Take 500 mg by mouth every 6 (six) hours as needed.    [provider]  ALPRAZolam Duanne Moron) 0.5 MG tablet Take 0.5 mg by mouth at bedtime as needed for anxiety.    [provider]  ARIPiprazole (ABILIFY) 2 MG tablet Take 2 mg by mouth at bedtime.    [provider]  ibuprofen (ADVIL) 800 MG tablet TAKE 1 TABLET(800 MG) BY MOUTH EVERY 8 HOURS AS NEEDED 01/15/21   Shelly Bombard, MD  oxyCODONE (OXY IR/ROXICODONE) 5 MG immediate  release tablet Take 1 tablet (5 mg total) by mouth every 6 (six) hours as needed for severe pain. 01/08/21   Chancy Milroy, MD  Sertraline HCl (ZOLOFT PO) Take 100 mg by mouth at bedtime.    [provider]    Allergies    Depakote [divalproex sodium], Methylphenidate derivatives, Neurontin [gabapentin], Prozac [fluoxetine hcl], and Methylphenidate hcl  Review of Systems   Review of Systems  Constitutional: Negative for fever.  Genitourinary: Negative for dysuria, pelvic pain and vaginal bleeding.  Musculoskeletal:       Right knee pain  Skin: Negative for wound.  Neurological: Negative for weakness and numbness.    Physical Exam Updated Vital Signs BP 129/74   Pulse 69   Temp 99.3 F (37.4 C) (Oral)   Resp 18   Ht 5\' 4"  (1.626 m)   Wt 104.3 kg   SpO2 100%   BMI 39.48 kg/m   Physical Exam Vitals and nursing note reviewed.  Constitutional:      General: She is not in acute distress.    Appearance: She is well-developed.  HENT:     Head: Normocephalic and atraumatic.  Eyes:     Conjunctiva/sclera: Conjunctivae normal.  Cardiovascular:     Rate and Rhythm: Normal rate.  Pulmonary:     Effort: Pulmonary effort is normal.  Musculoskeletal:        General: Normal range of motion.     Cervical back: Neck supple.     Comments: TTP to the right knee along the right medial joint line. No joint laxity with varus/valgus stress or with anterior/posterior drawer testing. No posterior knee TTP or TTP to the proximal tib/fib. DP 2+ and symmetric.   Skin:    General: Skin is warm and dry.  Neurological:     Mental Status: She is alert.     ED Results / Procedures / Treatments   Labs (all labs ordered are listed, but only abnormal results are displayed) Labs Reviewed  HCG, SERUM, QUALITATIVE    EKG None  Radiology DG Knee Complete 4 Views Right  Result Date: 03/27/2021 CLINICAL DATA:  Twisting injury, pain EXAM: RIGHT KNEE - COMPLETE 4+ VIEW COMPARISON:   None. FINDINGS: No evidence of fracture, dislocation, or joint effusion. No evidence of arthropathy or other focal bone abnormality. Soft tissues are unremarkable. IMPRESSION: Negative. Electronically Signed   By: Rolm Baptise M.D.   On: 03/27/2021 11:56    Procedures Procedures   Medications Ordered in ED Medications  acetaminophen (TYLENOL) tablet 650 mg (650 mg Oral Given 03/27/21 1209)  ED Course  I have reviewed the triage vital signs and the nursing notes.  Pertinent labs & imaging results that were available during my care of the patient were reviewed by me and considered in my medical decision making (see chart for details).    MDM Rules/Calculators/A&P                          35 y/o F presenting with R knee pain after twisting injury PTA. ttp along the medial joint line noted.   Xray right knee reviewed/interpreted and neg for fx.  Pt offered knee immobilizer and crutches however she declined. She is also requesting preg test. She has no pelvic pain and vaginal bleeding to suggest ectopic. On reassessment she states she feels better and wants to be discharged. She states she does not want to wait for the results of her pregnancy test and will follow up on her MyChart.  Patient discharged in stable condition all questions were answered.  Final Clinical Impression(s) / ED Diagnoses Final diagnoses:  Acute pain of right knee  Encounter for pregnancy test, result unknown    Rx / DC Orders ED Discharge Orders    None       Rodney Booze, PA-C 03/27/21 1231    Gareth Morgan, MD 03/29/21 0800

## 2021-03-27 NOTE — Discharge Instructions (Signed)
You may use tylenol and ibuprofen (if you are not pregnant) for your knee pain.  You are given information to follow-up with an orthopedic doctor about your recurrent knee pain.  Please call the office schedule an appointment for follow-up.  If your pregnancy test is positive then you should start taking prenatal vitamins.  Please return the emergency department for any new or worsening symptoms in the meantime.

## 2021-03-27 NOTE — ED Notes (Signed)
Pt refused knee immobilizer d/t lack of comfort.  States she has an ACE wrap at home she will use and wants to go home.  Provider notified and is printing paperwork

## 2021-03-28 ENCOUNTER — Encounter (HOSPITAL_BASED_OUTPATIENT_CLINIC_OR_DEPARTMENT_OTHER): Payer: Self-pay

## 2021-03-28 ENCOUNTER — Ambulatory Visit (HOSPITAL_BASED_OUTPATIENT_CLINIC_OR_DEPARTMENT_OTHER): Payer: 59 | Admitting: Nurse Practitioner

## 2021-04-20 ENCOUNTER — Other Ambulatory Visit: Payer: Self-pay

## 2021-04-20 ENCOUNTER — Inpatient Hospital Stay (HOSPITAL_COMMUNITY)
Admission: AD | Admit: 2021-04-20 | Discharge: 2021-04-20 | Disposition: A | Discharge status: Home / Self Care | Payer: 59 | Attending: Obstetrics & Gynecology | Admitting: Obstetrics & Gynecology

## 2021-04-20 DIAGNOSIS — R110 Nausea: Secondary | ICD-10-CM | POA: Diagnosis present

## 2021-04-20 DIAGNOSIS — Z3202 Encounter for pregnancy test, result negative: Secondary | ICD-10-CM | POA: Diagnosis present

## 2021-04-20 LAB — HCG, QUANTITATIVE, PREGNANCY: hCG, Beta Chain, Quant, S: <1 m[IU]/mL (ref <5)

## 2021-04-20 LAB — POCT PREGNANCY, URINE: Preg Test, Ur: NEGATIVE

## 2021-04-20 MED ORDER — PROMETHAZINE HCL 25 MG PO TABS: 25.0000 mg | ORAL_TABLET | Freq: Four times a day (QID) | ORAL | 0 refills | Status: DC | PRN

## 2021-04-20 NOTE — MAU Note (Signed)
Pt. Was medically screened by CNM before triage was done.  Pt left after labs were drawn in family room.  Unable to obtain vital signs or signature before pt left.

## 2021-04-20 NOTE — MAU Provider Note (Signed)
Event Date/Time   First Provider Initiated Contact with Patient 04/20/21 1150      [name] is a 35 y.o. [id number] patient who presents to MAU today with complaint of cramping. She reports cramping and a positive pregnancy test. She states the test turned positive an hour after she took it. She denies any bleeding. She is tearful because our test is negative.   O Patient refused vital signs.  Physical Exam Vitals and nursing note reviewed.  Constitutional:      General: She is not in acute distress.    Appearance: She is well-developed.  HENT:     Head: Normocephalic.  Eyes:     Pupils: Pupils are equal, round, and reactive to light.  Cardiovascular:     Rate and Rhythm: Normal rate and regular rhythm.     Heart sounds: Normal heart sounds.  Pulmonary:     Effort: Pulmonary effort is normal. No respiratory distress.     Breath sounds: Normal breath sounds.  Skin:    General: Skin is warm and dry.  Neurological:     Mental Status: She is alert and oriented to person, place, and time.  Psychiatric:        Behavior: Behavior normal.        Thought Content: Thought content normal.        Judgment: Judgment normal.    Results for orders placed or performed during the hospital encounter of 04/20/21 (from the past 24 hour(s))  Pregnancy, urine POC     Status: None   Collection Time: 04/20/21 11:46 AM  Result Value Ref Range   Preg Test, Ur NEGATIVE NEGATIVE  hCG, quantitative, pregnancy     Status: None   Collection Time: 04/20/21 12:05 PM  Result Value Ref Range   hCG, Beta Chain, Quant, S <1 <5 mIU/mL   A Medical screening exam complete 1. Negative pregnancy test   2. Nausea    Results reviewed with patient. Patient requested medication for nausea, sent RX.  P Discharge from MAU in stable condition Patient given the option of transfer to [facility] for further evaluation or seek care in outpatient facility of choice  List of options for follow-up given   Warning signs for worsening condition that would warrant emergency follow-up discussed Patient may return to MAU as needed   Wende Mott, CNM 04/20/2021 1:32 PM

## 2021-04-20 NOTE — MAU Note (Signed)
Pt reports to mau with c/o cramping after pos hpt.

## 2021-04-23 ENCOUNTER — Ambulatory Visit: Payer: 59 | Admitting: Family Medicine

## 2021-04-23 NOTE — Progress Notes (Deleted)
  Nichole TAVENNER - 35 y.o. female MRN [id number]  Date of birth: [birth year]  SUBJECTIVE:  Including CC & ROS.  No chief complaint on file.   [name] is a 35 y.o. female that is  ***.  ***   Review of Systems See HPI   HISTORY: Past Medical, Surgical, Social, and Family History Reviewed & Updated per EMR.   Pertinent Historical Findings include:  Past Medical History:  Diagnosis Date  . Anxiety   . Asthma    inhaler PRN-hasn't used inhaler in months  . Attention deficit hyperactivity disorder   . Bipolar disorder [facility])    hospitalized as a teen for suicidal ideations and cutting  . Depression    no complaints, doing well  . Gastritis   . GERD (gastroesophageal reflux disease)   . History of gestational diabetes in prior pregnancy, currently pregnant 10/13/2017  . History of preterm delivery after IOL for preeclampsia, currently pregnant 10/13/2017   Not a 17P candidate  . Hx of varicella   . Mild preeclampsia 02/09/2016  . Vaginal Pap smear, abnormal     Past Surgical History:  Procedure Laterality Date  . DILATION AND EVACUATION N/A 01/08/2021   Procedure: DILATATION AND EVACUATION;  Surgeon: Chancy Milroy, MD;  Location: [facility];  Service: Gynecology;  Laterality: N/A;  . EYE MUSCLE SURGERY Bilateral    07/29/12  . MOUTH SURGERY    . PLANTAR FASCIA SURGERY    . WISDOM TOOTH EXTRACTION      Family History  Problem Relation Age of Onset  . Depression Mother   . Colon cancer Mother   . Colon polyps Mother   . Depression Father   . Lung cancer Father   . Depression Brother   . Liver disease Other   . Kidney disease Other     Social History   Socioeconomic History  . Marital status: Married    Spouse name: Not on file  . Number of children: 0  . Years of education: Not on file  . Highest education level: Not on file  Occupational History  . Not on file  Tobacco Use  . Smoking status: Never Smoker  . Smokeless tobacco:  Never Used  Vaping Use  . Vaping Use: Never used  Substance and Sexual Activity  . Alcohol use: No    Alcohol/week: 0.0 standard drinks  . Drug use: No  . Sexual activity: Yes    Birth control/protection: None  Other Topics Concern  . Not on file  Social History Narrative  . Not on file   Social Determinants of Health   Financial Resource Strain: Not on file  Food Insecurity: Not on file  Transportation Needs: Not on file  Physical Activity: Not on file  Stress: Not on file  Social Connections: Not on file  Intimate Partner Violence: Not on file     PHYSICAL EXAM:  VS: There were no vitals taken for this visit. Physical Exam Gen: NAD, alert, cooperative with exam, well-appearing MSK:  ***      ASSESSMENT & PLAN:   No problem-specific Assessment & Plan notes found for this encounter.

## 2021-04-30 ENCOUNTER — Ambulatory Visit: Payer: 59 | Admitting: Sports Medicine

## 2021-05-06 ENCOUNTER — Encounter: Payer: Self-pay | Admitting: Physician Assistant

## 2021-05-06 ENCOUNTER — Telehealth: Payer: 59 | Admitting: Physician Assistant

## 2021-05-06 DIAGNOSIS — J069 Acute upper respiratory infection, unspecified: Secondary | ICD-10-CM

## 2021-05-06 MED ORDER — GUAIFENESIN 100 MG/5ML PO SYRP: 200.0000 mg | ORAL_SOLUTION | Freq: Three times a day (TID) | ORAL | 0 refills | Status: DC | PRN

## 2021-05-06 MED ORDER — CHLORASEPTIC 1.4 % MT LIQD: 1.0000 | OROMUCOSAL | 0 refills | Status: AC | PRN

## 2021-05-06 NOTE — Patient Instructions (Signed)

## 2021-05-06 NOTE — Progress Notes (Signed)
[name], [name] are scheduled for a virtual visit with your provider today.    Just as we do with appointments in the office, we must obtain your consent to participate.  Your consent will be active for this visit and any virtual visit you may have with one of our providers in the next 365 days.    If you have a MyChart account, I can also send a copy of this consent to you electronically.  All virtual visits are billed to your insurance company just like a traditional visit in the office.  As this is a virtual visit, video technology does not allow for your provider to perform a traditional examination.  This may limit your provider's ability to fully assess your condition.  If your provider identifies any concerns that need to be evaluated in person or the need to arrange testing such as labs, EKG, etc, we will make arrangements to do so.    Although advances in technology are sophisticated, we cannot ensure that it will always work on either your end or our end.  If the connection with a video visit is poor, we may have to switch to a telephone visit.  With either a video or telephone visit, we are not always able to ensure that we have a secure connection.   I need to obtain your verbal consent now.   Are you willing to proceed with your visit today?   [name] has provided verbal consent on 05/06/2021 for a virtual visit (video or telephone).   Karrie Meres, PA-C 05/06/2021  6:02 PM   [name],you are scheduled for a virtual visit with your provider today.    Just as we do with appointments in the office, we must obtain your consent to participate.  Your consent will be active for this visit and any virtual visit you may have with one of our providers in the next 365 days.    If you have a MyChart account, I can also send a copy of this consent to you electronically.  All virtual visits are billed to your insurance company just like a traditional visit in the office.  As  this is a virtual visit, video technology does not allow for your provider to perform a traditional examination.  This may limit your provider's ability to fully assess your condition.  If your provider identifies any concerns that need to be evaluated in person or the need to arrange testing such as labs, EKG, etc, we will make arrangements to do so.    Although advances in technology are sophisticated, we cannot ensure that it will always work on either your end or our end.  If the connection with a video visit is poor, we may have to switch to a telephone visit.  With either a video or telephone visit, we are not always able to ensure that we have a secure connection.   I need to obtain your verbal consent now.   Are you willing to proceed with your visit today?   [name] has provided verbal consent on 05/06/2021 for a virtual visit (video or telephone).   Karrie Meres, [name] 05/06/2021  6:02 PM   Date:  05/06/2021   ID:  [name], DOB [birth year], MRN [id number]  Patient Location: Home Provider Location: Home Office   Participants: Patient and Provider for Visit and Wrap up  Method of visit: Video  Location of Patient: Home Location of Provider: Home Office Consent  was obtain for visit over the video. Services rendered by provider: Visit was performed via video  A video enabled telemedicine application was used and I verified that I am speaking with the correct person using two identifiers.  PCP:  Patient, No Pcp Per (Inactive)   Chief Complaint:  Sore throat, cough  History of Present Illness:    [name] is a 35 y.o. female with history as stated below. Presents video telehealth for an acute care visit  Onset of symptoms was 2 days ago. symptoms have been persistent and include: sore throat, cough, rhinorrhea. Denies fevers, sneezing, itchy/watery eyes, nvd. Took home COVID test that was negative.    Pt denies any known COVID exposures. She  does not normally have allergies but she thinks her sxs are related to the weather change. She is fully vaccinated against COVID. She has tried OTC medications without relief.    States her 53 month old daughter and partner have similar sxs.  No other aggravating or relieving factors.  No other c/o.  Past Medical, Surgical, Social History, Allergies, and Medications have been Reviewed.  Past Medical History:  Diagnosis Date  . Anxiety   . Asthma    inhaler PRN-hasn't used inhaler in months  . Attention deficit hyperactivity disorder   . Bipolar disorder [facility])    hospitalized as a teen for suicidal ideations and cutting  . Depression    no complaints, doing well  . Gastritis   . GERD (gastroesophageal reflux disease)   . History of gestational diabetes in prior pregnancy, currently pregnant 10/13/2017  . History of preterm delivery after IOL for preeclampsia, currently pregnant 10/13/2017   Not a 17P candidate  . Hx of varicella   . Mild preeclampsia 02/09/2016  . Vaginal Pap smear, abnormal     No outpatient medications have been marked as taking for the 05/06/21 encounter (Appointment) with Samson Frederic, Kalise Fickett S, PA-C.     Allergies:   Depakote [divalproex sodium], Methylphenidate derivatives, Neurontin [gabapentin], Prozac [fluoxetine hcl], and Methylphenidate hcl   ROS See HPI for history of present illness.  Physical Exam Constitutional:      General: She is not in acute distress.    Appearance: She is not ill-appearing.  HENT:     Head: Normocephalic.     Nose: No congestion.     Mouth/Throat:     Comments: Tolerating secretions, normal phonation Eyes:     Conjunctiva/sclera: Conjunctivae normal.  Pulmonary:     Effort: Pulmonary effort is normal.  Neurological:     Mental Status: She is alert and oriented to person, place, and time.               There are no diagnoses linked to this encounter.   Time:   Today, I have spent 17 minutes with the patient with  telehealth technology discussing the above problems, reviewing the chart, previous notes, medications and orders.    Tests Ordered: No orders of the defined types were placed in this encounter.   Medication Changes: No orders of the defined types were placed in this encounter.  MDM: pt with URI sxs. Had neg covid test at home. Has sick contacts in the household with similar sxs. Suspect viral uri. Pt nontoxic appear and in no distress. meds for symptomatic tx give.   Disposition:  Follow up  Signed, Karrie Meres, PA-C  05/06/2021 6:02 PM

## 2021-05-07 ENCOUNTER — Encounter: Payer: Self-pay | Admitting: Nurse Practitioner

## 2021-05-07 ENCOUNTER — Telehealth: Payer: 59 | Admitting: Nurse Practitioner

## 2021-05-07 DIAGNOSIS — J0140 Acute pansinusitis, unspecified: Secondary | ICD-10-CM

## 2021-05-07 MED ORDER — FLUTICASONE PROPIONATE 50 MCG/ACT NA SUSP: 2.0000 | Freq: Every day | NASAL | 6 refills | Status: DC

## 2021-05-07 MED ORDER — AMOXICILLIN-POT CLAVULANATE 875-125 MG PO TABS: 1.0000 | ORAL_TABLET | Freq: Two times a day (BID) | ORAL | 0 refills | Status: DC

## 2021-05-07 NOTE — Progress Notes (Signed)
[name], [name] are scheduled for a virtual visit with your provider today.    Just as we do with appointments in the office, we must obtain your consent to participate.  Your consent will be active for this visit and any virtual visit you may have with one of our providers in the next 365 days.    If you have a MyChart account, I can also send a copy of this consent to you electronically.  All virtual visits are billed to your insurance company just like a traditional visit in the office.  As this is a virtual visit, video technology does not allow for your provider to perform a traditional examination.  This may limit your provider's ability to fully assess your condition.  If your provider identifies any concerns that need to be evaluated in person or the need to arrange testing such as labs, EKG, etc, we will make arrangements to do so.    Although advances in technology are sophisticated, we cannot ensure that it will always work on either your end or our end.  If the connection with a video visit is poor, we may have to switch to a telephone visit.  With either a video or telephone visit, we are not always able to ensure that we have a secure connection.   I need to obtain your verbal consent now.   Are you willing to proceed with your visit today?   [name] has provided verbal consent on 05/07/2021 for a virtual visit (video or telephone).   Nichole Schneiders, FNP 05/07/2021  8:05 AM     Virtual Visit via Video   I connected with patient on 05/07/21 at  8:00 AM EDT by a video enabled telemedicine application and verified that I am speaking with the correct person using two identifiers.  Location patient: Home Location provider: [place] participating in the virtual visit: Patient, Provider  I discussed the limitations of evaluation and management by telemedicine and the availability of in person appointments. The patient expressed understanding and  agreed to proceed.  Subjective:   HPI:   Patient presents via [facility] today with three days of worsening illness that started with sore throat that progressed to a cough. She took a home COVID test yesterday that was negative. She has not had COVID in the past and has been vaccinated with [employer].   She had asthma as a child, has not had an inhaler recently. Asthma has been well controlled   She has been using Nyquil and Dayquil for relief so far.  Congestion is mostly in her head not so much in chest.  Denies SOB.   She does suffer from seasonal allergies and stays on zyrtec for that   She has a child that is sick as well. Has not been to the doctor yet planning to take them in as well.   Patient was seen at doctor yesterday and prescribed a cough medication not covered by her insurance so she was unable to purchase that medication.   ROS:   See pertinent positives and negatives per HPI.  Patient Active Problem List   Diagnosis Date Noted  . Retained products of conception after miscarriage 12/27/2020  . Nexplanon insertion 03/17/2020  . Nausea and vomiting during pregnancy 10/04/2019  . 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 Anxiety state 03/27/2014  . OSA (obstructive sleep apnea) 09/15/2013  . Asthma 05/21/2013  .  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 Intellectual disability 09/15/2012  . Obesity (BMI 30-39.9) 08/03/2012  . Bipolar affective disorder, depressed, moderate degree ([place]) 06/02/2012    Class: Acute  . GERD (gastroesophageal reflux disease) 05/19/2012  . Borderline behavior 04/12/2012    Class: Acute    Social History   Tobacco Use  . Smoking status: Never Smoker  . Smokeless tobacco: Never Used  Substance Use Topics  . Alcohol use: No    Alcohol/week: 0.0 standard drinks    Current Outpatient Medications:  .  acetaminophen (TYLENOL) 500 MG tablet, Take 500 mg by mouth  every 6 (six) hours as needed., Disp: , Rfl:  .  ALPRAZolam (XANAX) 0.5 MG tablet, Take 0.5 mg by mouth at bedtime as needed for anxiety., Disp: , Rfl:  .  ARIPiprazole (ABILIFY) 2 MG tablet, Take 2 mg by mouth at bedtime., Disp: , Rfl:  .  guaifenesin (ROBITUSSIN) 100 MG/5ML syrup, Take 10 mLs (200 mg total) by mouth 3 (three) times daily as needed for cough., Disp: 120 mL, Rfl: 0 .  ibuprofen (ADVIL) 800 MG tablet, TAKE 1 TABLET(800 MG) BY MOUTH EVERY 8 HOURS AS NEEDED, Disp: 30 tablet, Rfl: 5 .  oxyCODONE (OXY IR/ROXICODONE) 5 MG immediate release tablet, Take 1 tablet (5 mg total) by mouth every 6 (six) hours as needed for severe pain., Disp: 15 tablet, Rfl: 0 .  phenol (CHLORASEPTIC) 1.4 % LIQD, Use as directed 1 spray in the mouth or throat as needed for up to 5 days for throat irritation / pain., Disp: 118 mL, Rfl: 0 .  promethazine (PHENERGAN) 25 MG tablet, Take 1 tablet (25 mg total) by mouth every 6 (six) hours as needed for nausea or vomiting., Disp: 30 tablet, Rfl: 0 .  Sertraline HCl (ZOLOFT PO), Take 100 mg by mouth at bedtime., Disp: , Rfl:   Allergies  Allergen Reactions  . Depakote [Divalproex Sodium] Other (See Comments)    Pt states that this medication makes her blood levels toxic.    [name] Methylphenidate Derivatives Other (See Comments)    Reaction:  Depression and anger   . Neurontin [Gabapentin] Other (See Comments)    Reaction:  Dizziness   . Prozac [Fluoxetine Hcl] Other (See Comments)    Reaction:  Anger   . Methylphenidate Hcl     SAME AS PREVIOUS LISTED METHYLPHENIDATE    Objective:   There were no vitals taken for this visit.  Patient is well-developed, well-nourished in no acute distress appears ill nasal congestion present.  Resting comfortably at home.  Head is normocephalic, atraumatic.  No labored breathing. Dry cough witnessed  Speech is clear and coherent with logical content.  Patient is alert and oriented at baseline.    Assessment and Plan:    Discussed with patient that using over the counter medications is most helpful.   If she finds out that her child tests positive for a contagious virus she will follow up with Mychart message to let the telehealth group know. We can consider a change in plan at that time if appropriate.   Will send antibiotic for sinus infection coverage to pharmacy if over the counter medications continue to be ineffective and sinus congestion progresses.   If starting antibiotic continue for 7 days and take with food.  Also prescribing flonase for added allergy and sinus congestion relief  If symptoms continue to persist schedule follow up with primary care or go to urgent care with any acute needs.   Meds ordered this  encounter  Medications  . amoxicillin-clavulanate (AUGMENTIN) 875-125 MG tablet    Sig: Take 1 tablet by mouth 2 (two) times daily for 7 days. Take with food    Dispense:  14 tablet    Refill:  0  . fluticasone (FLONASE) 50 MCG/ACT nasal spray    Sig: Place 2 sprays into both nostrils daily.    Dispense:  16 g    Refill:  [place], FNP 05/07/2021

## 2021-05-07 NOTE — Patient Instructions (Signed)

## 2021-05-09 ENCOUNTER — Ambulatory Visit
Admission: EM | Admit: 2021-05-09 | Discharge: 2021-05-09 | Disposition: A | Discharge status: Home / Self Care | Payer: 59 | Attending: Family Medicine | Admitting: Family Medicine

## 2021-05-09 ENCOUNTER — Other Ambulatory Visit: Payer: Self-pay

## 2021-05-09 DIAGNOSIS — J069 Acute upper respiratory infection, unspecified: Secondary | ICD-10-CM | POA: Diagnosis not present

## 2021-05-09 DIAGNOSIS — J4521 Mild intermittent asthma with (acute) exacerbation: Secondary | ICD-10-CM

## 2021-05-09 MED ORDER — PROMETHAZINE-DM 6.25-15 MG/5ML PO SYRP: 5.0000 mL | ORAL_SOLUTION | Freq: Four times a day (QID) | ORAL | 0 refills | Status: DC | PRN

## 2021-05-09 MED ORDER — ALBUTEROL SULFATE HFA 108 (90 BASE) MCG/ACT IN AERS: 1.0000 | INHALATION_SPRAY | Freq: Four times a day (QID) | RESPIRATORY_TRACT | 0 refills | Status: DC | PRN

## 2021-05-09 NOTE — ED Provider Notes (Addendum)
[facility]    CSN: [id number] Arrival date & time: 05/09/21  1718      History   Chief Complaint Chief Complaint  Patient presents with  . Cough    HPI [name] is a 35 y.o. female.   Presenting today with 4-day history of cough, congestion, scratchy throat, fatigue.  Denies fever, chills, body aches, chest pain, shortness of breath, abdominal pain, nausea vomiting or diarrhea.  So far trying over-the-counter cough syrups, Chloraseptic spray with minimal relief.  States the cough is the most bothersome symptom at this time.  She does have a history of seasonal allergies and asthma but has not required an albuterol inhaler for many years.    Past Medical History:  Diagnosis Date  . Anxiety   . Asthma    inhaler PRN-hasn't used inhaler in months  . Attention deficit hyperactivity disorder   . Bipolar disorder [facility])    hospitalized as a teen for suicidal ideations and cutting  . Depression    no complaints, doing well  . Gastritis   . GERD (gastroesophageal reflux disease)   . History of gestational diabetes in prior pregnancy, currently pregnant 10/13/2017  . History of preterm delivery after IOL for preeclampsia, currently pregnant 10/13/2017   Not a 17P candidate  . Hx of varicella   . Mild preeclampsia 02/09/2016  . Vaginal Pap smear, abnormal    Patient Active Problem List   Diagnosis Date Noted  . Retained products of conception after miscarriage 12/27/2020  . Nexplanon insertion 03/17/2020  . Nausea and vomiting during pregnancy 10/04/2019  . 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 Anxiety state 03/27/2014  . OSA (obstructive sleep apnea) 09/15/2013  . Asthma 05/21/2013  . 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 Intellectual disability 09/15/2012  . Obesity (BMI 30-39.9) 08/03/2012  . Bipolar affective disorder, depressed, moderate degree ([place]) 06/02/2012     Class: Acute  . GERD (gastroesophageal reflux disease) 05/19/2012  . Borderline behavior 04/12/2012    Class: Acute   Past Surgical History:  Procedure Laterality Date  . DILATION AND EVACUATION N/A 01/08/2021   Procedure: DILATATION AND EVACUATION;  Surgeon: Chancy Milroy, MD;  Location: [facility];  Service: Gynecology;  Laterality: N/A;  . EYE MUSCLE SURGERY Bilateral    07/29/12  . MOUTH SURGERY    . PLANTAR FASCIA SURGERY    . WISDOM TOOTH EXTRACTION      OB History    Gravida  7   Para  5   Term  4   Preterm  1   AB  1   Living  5     SAB  1   IAB      Ectopic      Multiple  0   Live Births  5          Home Medications    Prior to Admission medications   Medication Sig Start Date End Date Taking? Authorizing Provider  albuterol (VENTOLIN HFA) 108 (90 Base) MCG/ACT inhaler Inhale 1-2 puffs into the lungs every 6 (six) hours as needed for wheezing or shortness of breath. 05/09/21  Yes Volney American, PA-C  promethazine-dextromethorphan (PROMETHAZINE-DM) 6.25-15 MG/5ML syrup Take 5 mLs by mouth 4 (four) times daily as needed for cough. 05/09/21  Yes Volney American, PA-C  acetaminophen (TYLENOL) 500 MG tablet Take 500 mg by mouth every 6 (six) hours as needed.    [provider]  ALPRAZolam (XANAX) 0.5 MG tablet Take 0.5 mg by mouth at bedtime as needed for anxiety.    [provider]  ARIPiprazole (ABILIFY) 2 MG tablet Take 2 mg by mouth at bedtime.    [provider]  guaifenesin (ROBITUSSIN) 100 MG/5ML syrup Take 10 mLs (200 mg total) by mouth 3 (three) times daily as needed for cough. 05/06/21   Couture, Cortni S, PA-C  ibuprofen (ADVIL) 800 MG tablet TAKE 1 TABLET(800 MG) BY MOUTH EVERY 8 HOURS AS NEEDED 01/15/21   Shelly Bombard, MD  oxyCODONE (OXY IR/ROXICODONE) 5 MG immediate release tablet Take 1 tablet (5 mg total) by mouth every 6 (six) hours as needed for severe pain. 01/08/21   Chancy Milroy, MD  phenol (CHLORASEPTIC) 1.4 % LIQD Use as directed 1 spray in the mouth or throat as needed for up to 5 days for throat irritation / pain. 05/06/21 05/11/21  Couture, Cortni S, PA-C  promethazine (PHENERGAN) 25 MG tablet Take 1 tablet (25 mg total) by mouth every 6 (six) hours as needed for nausea or vomiting. 04/20/21   Wende Mott, CNM  Sertraline HCl (ZOLOFT PO) Take 100 mg by mouth at bedtime.    [provider]    Family History Family History  Problem Relation Age of Onset  . Depression Mother   . Colon cancer Mother   . Colon polyps Mother   . Depression Father   . Lung cancer Father   . Depression Brother   . Liver disease Other   . Kidney disease Other     Social History Social History   Tobacco Use  . Smoking status: Never Smoker  . Smokeless tobacco: Never Used  Vaping Use  . Vaping Use: Never used  Substance Use Topics  . Alcohol use: No    Alcohol/week: 0.0 standard drinks  . Drug use: No     Allergies   Depakote [divalproex sodium], Methylphenidate derivatives, Neurontin [gabapentin], Prozac [fluoxetine hcl], and Methylphenidate hcl   Review of Systems Review of Systems Per HPI  Physical Exam Triage Vital Signs ED Triage Vitals [05/09/21 1807]  Enc Vitals Group     BP      Pulse Rate 100     Resp 18     Temp 99.1 F (37.3 C)     Temp Source Oral     SpO2      Weight      Height      Head Circumference      Peak Flow      Pain Score 5     Pain Loc      Pain Edu?      Excl. in [place]?    No data found.  Updated Vital Signs Pulse 100   Temp 99.1 F (37.3 C) (Oral)   Resp 18   Breastfeeding No   Visual Acuity Right Eye Distance:   Left Eye Distance:   Bilateral Distance:    Right Eye Near:   Left Eye Near:    Bilateral Near:     Physical Exam Vitals and nursing note reviewed.  Constitutional:      Appearance: Normal appearance. She is not ill-appearing.  HENT:     Head: Atraumatic.     Right Ear:  Tympanic membrane normal.     Left Ear: Tympanic membrane normal.     Nose: Rhinorrhea present.     Mouth/Throat:     Mouth: Mucous membranes are moist.  Pharynx: Oropharynx is clear. Posterior oropharyngeal erythema present.  Eyes:     Extraocular Movements: Extraocular movements intact.     Conjunctiva/sclera: Conjunctivae normal.  Cardiovascular:     Rate and Rhythm: Normal rate and regular rhythm.     Heart sounds: Normal heart sounds.  Pulmonary:     Effort: Pulmonary effort is normal. No respiratory distress.     Breath sounds: Normal breath sounds. No wheezing or rales.  Abdominal:     General: Bowel sounds are normal. There is no distension.     Palpations: Abdomen is soft.     Tenderness: There is no abdominal tenderness. There is no guarding.  Musculoskeletal:        General: Normal range of motion.     Cervical back: Normal range of motion and neck supple.  Skin:    General: Skin is warm and dry.  Neurological:     Mental Status: She is alert and oriented to person, place, and time.  Psychiatric:        Mood and Affect: Mood normal.        Thought Content: Thought content normal.        Judgment: Judgment normal.    UC Treatments / Results  Labs (all labs ordered are listed, but only abnormal results are displayed) Labs Reviewed - No data to display  EKG   Radiology No results found.  Procedures Procedures (including critical care time)  Medications Ordered in UC Medications - No data to display  Initial Impression / Assessment and Plan / UC Course  I have reviewed the triage vital signs and the nursing notes.  Pertinent labs & imaging results that were available during my care of the patient were reviewed by me and considered in my medical decision making (see chart for details).     Overall exam and vital signs reassuring today, will treat with Phenergan DM, albuterol inhaler, Mucinex.  Home COVID testing negative, declines repeat testing today.   Supportive home measures reviewed.  Return precautions given.  Work note given and isolation instructions reviewed  Final Clinical Impressions(s) / UC Diagnoses   Final diagnoses:  Viral URI with cough  Mild intermittent asthma with acute exacerbation   Discharge Instructions   None    ED Prescriptions    Medication Sig Dispense Auth. Provider   promethazine-dextromethorphan (PROMETHAZINE-DM) 6.25-15 MG/5ML syrup Take 5 mLs by mouth 4 (four) times daily as needed for cough. 100 mL Volney American, PA-C   albuterol (VENTOLIN HFA) 108 (90 Base) MCG/ACT inhaler Inhale 1-2 puffs into the lungs every 6 (six) hours as needed for wheezing or shortness of breath. 18 g Volney American, [place]     PDMP not reviewed this encounter.   Volney American, [place] 05/09/21 [place], [place], [place] 05/09/21 [phone]

## 2021-05-09 NOTE — ED Triage Notes (Signed)
Pt c/o cough and running nose x3 days. States had neg covid test. Denies wanted to be tested.

## 2021-05-12 ENCOUNTER — Other Ambulatory Visit: Payer: Self-pay

## 2021-05-12 ENCOUNTER — Telehealth: Payer: Self-pay | Admitting: Women's Health

## 2021-05-12 ENCOUNTER — Inpatient Hospital Stay (HOSPITAL_COMMUNITY)
Admission: AD | Admit: 2021-05-12 | Discharge: 2021-05-12 | Disposition: A | Discharge status: Home / Self Care | Payer: 59 | Attending: Family Medicine | Admitting: Family Medicine

## 2021-05-12 DIAGNOSIS — Z3202 Encounter for pregnancy test, result negative: Secondary | ICD-10-CM

## 2021-05-12 DIAGNOSIS — R109 Unspecified abdominal pain: Secondary | ICD-10-CM | POA: Diagnosis present

## 2021-05-12 LAB — POCT PREGNANCY, URINE: Preg Test, Ur: NEGATIVE

## 2021-05-12 LAB — HCG, QUANTITATIVE, PREGNANCY: hCG, Beta Chain, Quant, S: <1 m[IU]/mL (ref <5)

## 2021-05-12 NOTE — Discharge Instructions (Signed)
Prenatal Care Providers           [facility] [place] [facility] - accepts patients without insurance  Phone: [place] for [place] [facility]   Phone: [place] @[facility]       Phone: [place] for [place] @ [facility]     Phone: [place] for [place] @ [facility]   Phone: [place] for [place] [facility] - accepts patients without insurance  Phone: [place] for [place] [facility] Phone: [place] [facility] - accepts patients without insurance Phone: [facility]  Phone: [facility] Phone: [phone]  [facility] Phone: [phone]  [facility] Phone: [phone]  [facility] Phone: [phone]  [facility]  Phone: [phone]

## 2021-05-12 NOTE — MAU Note (Signed)
[name] is a 35 y.o.  here in MAU reporting: cramping and vaginal discharge. Reports faint + UPT.   LMP: 04/21/21  Onset of complaint: ongoing  Pain score: 5/10  Vitals:   05/12/21 1222  BP: 136/75  Pulse: 81  Resp: 16  Temp: 99.5 F (37.5 C)     Lab orders placed from triage: UPT

## 2021-05-12 NOTE — MAU Provider Note (Signed)
Event Date/Time   First Provider Initiated Contact with Patient 05/12/21 1247      S [name] is a 35 y.o. [id number] patient who presents to MAU today with complaint of cramping. Patient initially reported a negative UPT at home, but then reported a positive home UPT to RN and a faint positive home UPT to provider. Will draw hCG today.  O BP 136/75 (BP Location: Right Arm)   Pulse 81   Temp 99.5 F (37.5 C) (Oral)   Resp 16   LMP 04/21/2021    Physical Exam Vitals and nursing note reviewed.  Constitutional:      General: She is not in acute distress.    Appearance: Normal appearance. She is not ill-appearing, toxic-appearing or diaphoretic.  HENT:     Head: Normocephalic and atraumatic.  Pulmonary:     Effort: Pulmonary effort is normal.  Neurological:     Mental Status: She is alert and oriented to person, place, and time.  Psychiatric:        Mood and Affect: Mood normal.        Behavior: Behavior normal.        Thought Content: Thought content normal.        Judgment: Judgment normal.    A Medical screening exam complete UPT negative HCG drawn  P Discharge from MAU in stable condition List of options for follow-up given Warning signs for worsening condition that would warrant emergency follow-up discussed Patient may return to MAU as needed   Kaden Daughdrill, Gerrie Nordmann, NP 05/12/2021 12:53 PM

## 2021-05-12 NOTE — Telephone Encounter (Signed)
Pt called with results and identified by two identifiers. Patient advised of negative hCG results. Patient tearful on results and reports she has no questions and hung up the phone.  Clarisa Fling, NP  2:01 PM 05/12/2021

## 2021-05-13 ENCOUNTER — Other Ambulatory Visit: Payer: Self-pay

## 2021-05-13 ENCOUNTER — Telehealth: Payer: Self-pay

## 2021-05-13 ENCOUNTER — Emergency Department (HOSPITAL_COMMUNITY)
Admission: EM | Admit: 2021-05-13 | Discharge: 2021-05-13 | Disposition: A | Discharge status: Home / Self Care | Payer: 59 | Attending: Emergency Medicine | Admitting: Emergency Medicine

## 2021-05-13 ENCOUNTER — Encounter (HOSPITAL_COMMUNITY): Payer: Self-pay | Admitting: Emergency Medicine

## 2021-05-13 DIAGNOSIS — J069 Acute upper respiratory infection, unspecified: Secondary | ICD-10-CM | POA: Diagnosis not present

## 2021-05-13 DIAGNOSIS — J45909 Unspecified asthma, uncomplicated: Secondary | ICD-10-CM | POA: Insufficient documentation

## 2021-05-13 DIAGNOSIS — R059 Cough, unspecified: Secondary | ICD-10-CM | POA: Diagnosis present

## 2021-05-13 LAB — GROUP A STREP BY PCR: Group A Strep by PCR: NOT DETECTED

## 2021-05-13 MED ORDER — HYDROCOD POLST-CPM POLST ER 10-8 MG/5ML PO SUER: 5.0000 mL | Freq: Two times a day (BID) | ORAL | 0 refills | Status: DC | PRN

## 2021-05-13 NOTE — Telephone Encounter (Signed)
   DONNIE GEDEON DOB: 1986-10-29 MRN: [id number]   RIDER WAIVER AND RELEASE OF LIABILITY  For purposes of improving physical access to our facilities, [facility] is pleased to partner with third parties to provide [facility] patients or other authorized individuals the option of convenient, on-demand ground transportation services (the [place]") through use of the technology service that enables users to request on-demand ground transportation from independent third-party providers.  By opting to use and accept these [employer], I, the undersigned, hereby agree on behalf of myself, and on behalf of any minor child using the [employer] for whom I am the parent or legal guardian, as follows:  1. Government social research officer provided to me are provided by independent third-party transportation providers who are not [employer] or employees and who are unaffiliated with [employer]. 2. [facility] is neither a transportation carrier nor a common or public carrier. 3. [facility] has no control over the quality or safety of the transportation that occurs as a result of the [employer]. 4. [facility] cannot guarantee that any third-party transportation provider will complete any arranged transportation service. 5. [facility] makes no representation, warranty, or guarantee regarding the reliability, timeliness, quality, safety, suitability, or availability of any of the Transport Services or that they will be error free. 6. I fully understand that traveling by vehicle involves risks and dangers of serious bodily injury, including permanent disability, paralysis, and death. I agree, on behalf of myself and on behalf of any minor child using the Transport Services for whom I am the parent or legal guardian, that the entire risk arising out of my use of the [employer] remains solely with me, to the maximum extent permitted under applicable law. 7. The [employer] are provided "as is" and "as available." [facility] disclaims all representations and warranties, express, implied or statutory, not expressly set out in these terms, including the implied warranties of merchantability and fitness for a particular purpose. 8. I hereby waive and release [facility], its agents, employees, officers, directors, representatives, insurers, attorneys, assigns, successors, subsidiaries, and affiliates from any and all past, present, or future claims, demands, liabilities, actions, causes of action, or suits of any kind directly or indirectly arising from acceptance and use of the [employer]. 9. I further waive and release [facility] and its affiliates from all present and future liability and responsibility for any injury or death to persons or damages to property caused by or related to the use of the [employer]. 10. I have read this Waiver and Release of Liability, and I understand the terms used in it and their legal significance. This Waiver is freely and voluntarily given with the understanding that my right (as well as the right of any minor child for whom I am the parent or legal guardian using the [employer]) to legal recourse against [place] in connection with the [employer] is knowingly surrendered in return for use of these services.   I attest that I read the consent document to [name], gave Ms. Granieri the opportunity to ask questions and answered the questions asked (if any). I affirm that [name] then provided consent for she's participation in this program.    Drucie Ip

## 2021-05-13 NOTE — Discharge Instructions (Addendum)
If you develop high fever, severe cough or cough with blood, trouble breathing, severe headache, neck pain/stiffness, vomiting, or any other new/concerning symptoms then return to the ER for evaluation  

## 2021-05-13 NOTE — ED Triage Notes (Signed)
C/o sore throat and cough x 1 week.  Negative home COVID test.

## 2021-05-13 NOTE — ED Provider Notes (Signed)
[facility] EMERGENCY DEPARTMENT Provider Note   CSN: [id number] Arrival date & time: 05/13/21  1015     History No chief complaint on file.   [name] is a 35 y.o. female.  HPI 35 year old female presents with cough and sore throat.  Ongoing for about 6 or 7 days.  Cough is nonproductive.  She has not had a fever.  She has been having some rhinorrhea.  Husband and child have the same symptoms.  Went to urgent care where she was prescribed antitussives and Phenergan though she is not vomiting.  Over-the-counter cough meds are not working.  Some shortness of breath though mild.  No chest pain.  No vomiting or diarrhea.  Has taken 2 negative COVID test at home.  Past Medical History:  Diagnosis Date  . Anxiety   . Asthma    inhaler PRN-hasn't used inhaler in months  . Attention deficit hyperactivity disorder   . Bipolar disorder [facility])    hospitalized as a teen for suicidal ideations and cutting  . Depression    no complaints, doing well  . Gastritis   . GERD (gastroesophageal reflux disease)   . History of gestational diabetes in prior pregnancy, currently pregnant 10/13/2017  . History of preterm delivery after IOL for preeclampsia, currently pregnant 10/13/2017   Not a 17P candidate  . Hx of varicella   . Mild preeclampsia 02/09/2016  . Vaginal Pap smear, abnormal     Patient Active Problem List   Diagnosis Date Noted  . Retained products of conception after miscarriage 12/27/2020  . Nexplanon insertion 03/17/2020  . Nausea and vomiting during pregnancy 10/04/2019  . 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 Anxiety state 03/27/2014  . OSA (obstructive sleep apnea) 09/15/2013  . Asthma 05/21/2013  . 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 Intellectual disability 09/15/2012  . Obesity (BMI 30-39.9) 08/03/2012  . Bipolar affective disorder, depressed, moderate degree ([place])  06/02/2012    Class: Acute  . GERD (gastroesophageal reflux disease) 05/19/2012  . Borderline behavior 04/12/2012    Class: Acute    Past Surgical History:  Procedure Laterality Date  . DILATION AND EVACUATION N/A 01/08/2021   Procedure: DILATATION AND EVACUATION;  Surgeon: Chancy Milroy, MD;  Location: [facility];  Service: Gynecology;  Laterality: N/A;  . EYE MUSCLE SURGERY Bilateral    07/29/12  . MOUTH SURGERY    . PLANTAR FASCIA SURGERY    . WISDOM TOOTH EXTRACTION       OB History    Gravida  7   Para  5   Term  4   Preterm  1   AB  1   Living  5     SAB  1   IAB      Ectopic      Multiple  0   Live Births  5           Family History  Problem Relation Age of Onset  . Depression Mother   . Colon cancer Mother   . Colon polyps Mother   . Depression Father   . Lung cancer Father   . Depression Brother   . Liver disease Other   . Kidney disease Other     Social History   Tobacco Use  . Smoking status: Never Smoker  . Smokeless tobacco: Never Used  Vaping Use  . Vaping Use: Never used  Substance Use Topics  .  Alcohol use: No    Alcohol/week: 0.0 standard drinks  . Drug use: No    Home Medications Prior to Admission medications   Medication Sig Start Date End Date Taking? Authorizing Provider  chlorpheniramine-HYDROcodone (TUSSIONEX PENNKINETIC ER) 10-8 MG/5ML SUER Take 5 mLs by mouth every 12 (twelve) hours as needed for cough. 05/13/21  Yes Sherwood Gambler, MD  acetaminophen (TYLENOL) 500 MG tablet Take 500 mg by mouth every 6 (six) hours as needed.    [provider]  albuterol (VENTOLIN HFA) 108 (90 Base) MCG/ACT inhaler Inhale 1-2 puffs into the lungs every 6 (six) hours as needed for wheezing or shortness of breath. 05/09/21   Volney American, PA-C  ALPRAZolam Duanne Moron) 0.5 MG tablet Take 0.5 mg by mouth at bedtime as needed for anxiety.    [provider]  ARIPiprazole (ABILIFY) 2 MG tablet  Take 2 mg by mouth at bedtime.    [provider]  guaifenesin (ROBITUSSIN) 100 MG/5ML syrup Take 10 mLs (200 mg total) by mouth 3 (three) times daily as needed for cough. 05/06/21   Couture, Cortni S, PA-C  ibuprofen (ADVIL) 800 MG tablet TAKE 1 TABLET(800 MG) BY MOUTH EVERY 8 HOURS AS NEEDED 01/15/21   Shelly Bombard, MD  oxyCODONE (OXY IR/ROXICODONE) 5 MG immediate release tablet Take 1 tablet (5 mg total) by mouth every 6 (six) hours as needed for severe pain. 01/08/21   Chancy Milroy, MD  promethazine (PHENERGAN) 25 MG tablet Take 1 tablet (25 mg total) by mouth every 6 (six) hours as needed for nausea or vomiting. 04/20/21   Wende Mott, CNM  promethazine-dextromethorphan (PROMETHAZINE-DM) 6.25-15 MG/5ML syrup Take 5 mLs by mouth 4 (four) times daily as needed for cough. 05/09/21   Volney American, PA-C  Sertraline HCl (ZOLOFT PO) Take 100 mg by mouth at bedtime.    [provider]    Allergies    Depakote [divalproex sodium], Methylphenidate derivatives, Neurontin [gabapentin], Prozac [fluoxetine hcl], and Methylphenidate hcl  Review of Systems   Review of Systems  Constitutional: Negative for fever.  HENT: Positive for rhinorrhea and sore throat.   Respiratory: Positive for cough and shortness of breath.   Cardiovascular: Negative for chest pain.  Gastrointestinal: Negative for abdominal pain, diarrhea and vomiting.  Neurological: Negative for light-headedness.  All other systems reviewed and are negative.   Physical Exam Updated Vital Signs BP 121/75   Pulse 83   Temp 97.7 F (36.5 C) (Oral)   Resp 16   LMP 04/21/2021   SpO2 97%   Physical Exam Vitals and nursing note reviewed.  Constitutional:      General: She is not in acute distress.    Appearance: She is well-developed. She is obese. She is not ill-appearing or diaphoretic.  HENT:     Head: Normocephalic and atraumatic.     Right Ear: External ear normal.     Left Ear: External ear  normal.     Nose: Nose normal.     Mouth/Throat:     Pharynx: Oropharynx is clear. No oropharyngeal exudate or posterior oropharyngeal erythema.  Eyes:     General:        Right eye: No discharge.        Left eye: No discharge.  Cardiovascular:     Rate and Rhythm: Normal rate and regular rhythm.     Heart sounds: Normal heart sounds.  Pulmonary:     Effort: Pulmonary effort is normal.     Breath  sounds: Normal breath sounds. No wheezing, rhonchi or rales.  Abdominal:     Palpations: Abdomen is soft.     Tenderness: There is no abdominal tenderness.  Skin:    General: Skin is warm and dry.  Neurological:     Mental Status: She is alert.  Psychiatric:        Mood and Affect: Mood is not anxious.     ED Results / Procedures / Treatments   Labs (all labs ordered are listed, but only abnormal results are displayed) Labs Reviewed  GROUP A STREP BY PCR    EKG None  Radiology No results found.  Procedures Procedures   Medications Ordered in ED Medications - No data to display  ED Course  I have reviewed the triage vital signs and the nursing notes.  Pertinent labs & imaging results that were available during my care of the patient were reviewed by me and considered in my medical decision making (see chart for details).    MDM Rules/Calculators/A&P                          Patient primarily seems to be here for antitussives.  She states she is already taking antibiotics and the cough medicine prescribed to her a few days ago was not working.  Given the length of time of her symptoms I suggested chest x-ray but she declines.  She also declines COVID PCR test.  Strep test from triage is negative.  She appears quite well.  Her heart rate was in the 110's in triage though currently is in the 80s.  No increased work of breathing or abnormal lung sounds.  No think further antibiotics are warranted and this likely is a viral infection.  Will discharge home with return  precautions. Final Clinical Impression(s) / ED Diagnoses Final diagnoses:  Viral upper respiratory infection    Rx / DC Orders ED Discharge Orders         Ordered    chlorpheniramine-HYDROcodone (TUSSIONEX PENNKINETIC ER) 10-8 MG/5ML SUER  Every 12 hours PRN        05/13/21 1137           Sherwood Gambler, MD 05/13/21 1204

## 2021-05-13 NOTE — ED Provider Notes (Addendum)
Emergency Medicine Provider Triage Evaluation Note  [name] , a 35 y.o. female  was evaluated in triage.  Pt complains of sore throat.  Review of Systems  Positive: Sore throat, cough, fatigue Negative: Fever, loss of taste or smell  Physical Exam  BP (!) 117/91 (BP Location: Left Arm)   Pulse (!) 117   Temp 98.7 F (37.1 C) (Oral)   Resp 18   LMP 04/21/2021   SpO2 96%  Gen:   Awake, no distress   Resp:  Normal effort  MSK:   Moves extremities without difficulty  Other:  Throat exam unremarkable  Medical Decision Making  Medically screening exam initiated at 10:27 AM.  Appropriate orders placed.  [name] was informed that the remainder of the evaluation will be completed by another provider, this initial triage assessment does not replace that evaluation, and the importance of remaining in the ED until their evaluation is complete.  Sore throat, cough x 1 week.  Tested for covid twice at home and was negative.  Husband and daughter with similar sxs. Pt refused covid test.  Report being fully vaccinated.       Domenic Moras, PA-C 05/13/21 1029    Luna Fuse, MD 05/17/21 [phone]

## 2021-06-09 ENCOUNTER — Inpatient Hospital Stay (HOSPITAL_COMMUNITY)
Admission: AD | Admit: 2021-06-09 | Discharge: 2021-06-09 | Disposition: A | Discharge status: Home / Self Care | Payer: 59 | Attending: Obstetrics & Gynecology | Admitting: Obstetrics & Gynecology

## 2021-06-09 ENCOUNTER — Other Ambulatory Visit: Payer: Self-pay

## 2021-06-09 ENCOUNTER — Encounter (HOSPITAL_COMMUNITY): Payer: Self-pay | Admitting: Obstetrics & Gynecology

## 2021-06-09 DIAGNOSIS — Z3A01 Less than 8 weeks gestation of pregnancy: Secondary | ICD-10-CM | POA: Diagnosis not present

## 2021-06-09 DIAGNOSIS — Z3201 Encounter for pregnancy test, result positive: Secondary | ICD-10-CM | POA: Diagnosis present

## 2021-06-09 LAB — URINALYSIS, ROUTINE W REFLEX MICROSCOPIC
Bilirubin Urine: NEGATIVE
Glucose, UA: NEGATIVE mg/dL
Hgb urine dipstick: NEGATIVE
Ketones, ur: NEGATIVE mg/dL
Nitrite: NEGATIVE
Protein, ur: 30 mg/dL — AB
Specific Gravity, Urine: 1.025 (ref 1.005–1.030)
pH: 5 (ref 5.0–8.0)

## 2021-06-09 LAB — WET PREP, GENITAL
Clue Cells Wet Prep HPF POC: NONE SEEN
Sperm: NONE SEEN
Trich, Wet Prep: NONE SEEN
Yeast Wet Prep HPF POC: NONE SEEN

## 2021-06-09 LAB — CBC
HCT: 40.9 % (ref 36.0–46.0)
Hemoglobin: 12.5 g/dL (ref 12.0–15.0)
MCH: 27.8 pg (ref 26.0–34.0)
MCHC: 30.6 g/dL (ref 30.0–36.0)
MCV: 91.1 fL (ref 80.0–100.0)
Platelets: 202 10*3/uL (ref 150–400)
RBC: 4.49 MIL/uL (ref 3.87–5.11)
RDW: 14.6 % (ref 11.5–15.5)
WBC: 6 10*3/uL (ref 4.0–10.5)
nRBC: 0 % (ref 0.0–0.2)

## 2021-06-09 LAB — HCG, QUANTITATIVE, PREGNANCY: hCG, Beta Chain, Quant, S: 43 m[IU]/mL — ABNORMAL HIGH (ref <5)

## 2021-06-09 LAB — POCT PREGNANCY, URINE: Preg Test, Ur: POSITIVE — AB

## 2021-06-09 NOTE — MAU Provider Note (Signed)
Event Date/Time   First Provider Initiated Contact with Patient 06/09/21 1523     [name] is a 35 y.o. [id number] patient who presents to MAU today with complaint of abdominal pain and vaginal bleeding. Upon CNM assessment, she states she "just told registration/RN that so they wouldn't look at her like she is crazy." She denies any pain or bleeding. She denies any abnormal discharge.   She is just requesting blood work at this time.    O BP (!) 117/59 (BP Location: Left Arm)   Pulse (!) 129   Temp 98.6 F (37 C) (Oral)   Resp 14   LMP 05/14/2021   SpO2 100%  Physical Exam Vitals and nursing note reviewed.  Constitutional:      General: She is not in acute distress.    Appearance: She is well-developed.  HENT:     Head: Normocephalic.  Eyes:     Pupils: Pupils are equal, round, and reactive to light.  Cardiovascular:     Rate and Rhythm: Normal rate and regular rhythm.     Heart sounds: Normal heart sounds.  Pulmonary:     Effort: Pulmonary effort is normal. No respiratory distress.     Breath sounds: Normal breath sounds.  Abdominal:     General: Bowel sounds are normal. There is no distension.     Palpations: Abdomen is soft.     Tenderness: There is no abdominal tenderness.  Skin:    General: Skin is warm and dry.  Neurological:     Mental Status: She is alert and oriented to person, place, and time.  Psychiatric:        Mood and Affect: Mood normal.        Behavior: Behavior normal.        Thought Content: Thought content normal.        Judgment: Judgment normal.   Results for orders placed or performed during the hospital encounter of 06/09/21 (from the past 24 hour(s))  Pregnancy, urine POC     Status: Abnormal   Collection Time: 06/09/21  3:03 PM  Result Value Ref Range   Preg Test, Ur POSITIVE (A) NEGATIVE    A Medical screening exam complete Ectopic work up ordered but patient changed story and reports no symptoms.  1. Positive pregnancy  test   2. [redacted] weeks gestation of pregnancy     P Discharge from MAU in stable condition Patient given the option of transfer to [facility] for further evaluation or seek care in outpatient facility of choice  List of options for follow-up given  Warning signs for worsening condition that would warrant emergency follow-up discussed Patient may return to MAU as needed   [name], [place] 06/09/2021 3:28 PM

## 2021-06-09 NOTE — MAU Note (Signed)
.  [name] is a 35 y.o. at [redacted]w[redacted]d here in MAU reporting: abdominal cramping that started yesterday. Denies VB. LMP was on 05/14/21. No abnormal discharge or recent IC.  Pain score: 1 Vitals:   06/09/21 1512  BP: (!) 117/59  Pulse: (!) 129  Resp: 14  Temp: 98.6 F (37 C)  SpO2: 100%      Lab orders placed from triage:  UA

## 2021-06-09 NOTE — MAU Note (Signed)
Patient requesting to be called with her labs and to go home, CNM notified.

## 2021-06-10 LAB — GC/CHLAMYDIA PROBE AMP (~~LOC~~) NOT AT ARMC
Chlamydia: NEGATIVE
Comment: NEGATIVE
Comment: NORMAL
Neisseria Gonorrhea: NEGATIVE

## 2021-06-23 ENCOUNTER — Inpatient Hospital Stay (HOSPITAL_COMMUNITY)
Admission: AD | Admit: 2021-06-23 | Discharge: 2021-06-23 | Disposition: A | Discharge status: Home / Self Care | Payer: 59 | Attending: Family Medicine | Admitting: Family Medicine

## 2021-06-23 ENCOUNTER — Encounter (HOSPITAL_COMMUNITY): Payer: Self-pay | Admitting: Family Medicine

## 2021-06-23 ENCOUNTER — Telehealth: Payer: Self-pay

## 2021-06-23 DIAGNOSIS — Z711 Person with feared health complaint in whom no diagnosis is made: Secondary | ICD-10-CM

## 2021-06-23 DIAGNOSIS — Z3A01 Less than 8 weeks gestation of pregnancy: Secondary | ICD-10-CM | POA: Diagnosis not present

## 2021-06-23 LAB — CBC
HCT: 40 % (ref 36.0–46.0)
Hemoglobin: 12.4 g/dL (ref 12.0–15.0)
MCH: 27.9 pg (ref 26.0–34.0)
MCHC: 31 g/dL (ref 30.0–36.0)
MCV: 90.1 fL (ref 80.0–100.0)
Platelets: 211 10*3/uL (ref 150–400)
RBC: 4.44 MIL/uL (ref 3.87–5.11)
RDW: 14.4 % (ref 11.5–15.5)
WBC: 5.8 10*3/uL (ref 4.0–10.5)
nRBC: 0 % (ref 0.0–0.2)

## 2021-06-23 LAB — URINALYSIS, ROUTINE W REFLEX MICROSCOPIC
Bilirubin Urine: NEGATIVE
Glucose, UA: NEGATIVE mg/dL
Hgb urine dipstick: NEGATIVE
Ketones, ur: NEGATIVE mg/dL
Nitrite: NEGATIVE
Protein, ur: NEGATIVE mg/dL
Specific Gravity, Urine: 1.023 (ref 1.005–1.030)
pH: 5 (ref 5.0–8.0)

## 2021-06-23 LAB — HCG, QUANTITATIVE, PREGNANCY: hCG, Beta Chain, Quant, S: 12305 m[IU]/mL — ABNORMAL HIGH (ref <5)

## 2021-06-23 NOTE — Discharge Instructions (Signed)

## 2021-06-23 NOTE — Telephone Encounter (Signed)
Attempted to call patient with HCG results. HIPPA compliant message left. Will send message through myChart.  Wende Mott, CNM 06/23/21 3:17 PM

## 2021-06-23 NOTE — MAU Note (Signed)
Pt reports to mau with c/o lower abd cramping and spotting for the past 3 days.

## 2021-06-23 NOTE — MAU Provider Note (Signed)
Event Date/Time   First Provider Initiated Contact with Patient 06/23/21 1323    [name] is a 35 y.o. [id number] patient who presents to MAU today for repeat HCG and reassurance of pregnancy status.  Upon arrival, she reported to RN that she was having lower abdominal cramping and vaginal discharge. When CNM assessed patient, she reports her discharge is normal and she has lower abdominal discomfort. She is requesting an ultrasound to be scheduled for when she can see the baby.   O BP 137/84 (BP Location: Right Arm)   Pulse 85   Temp 98.7 F (37.1 C) (Oral)   Resp 16   LMP 05/14/2021   SpO2 99%  Physical Exam Vitals and nursing note reviewed.  Constitutional:      General: She is not in acute distress.    Appearance: She is well-developed.  HENT:     Head: Normocephalic.  Eyes:     Pupils: Pupils are equal, round, and reactive to light.  Cardiovascular:     Rate and Rhythm: Normal rate and regular rhythm.     Heart sounds: Normal heart sounds.  Pulmonary:     Effort: Pulmonary effort is normal. No respiratory distress.     Breath sounds: Normal breath sounds.  Abdominal:     General: Bowel sounds are normal. There is no distension.     Palpations: Abdomen is soft.     Tenderness: There is no abdominal tenderness.  Skin:    General: Skin is warm and dry.  Neurological:     Mental Status: She is alert and oriented to person, place, and time.  Psychiatric:        Mood and Affect: Mood normal.        Behavior: Behavior normal.        Thought Content: Thought content normal.        Judgment: Judgment normal.    A Medical screening exam complete 1. [redacted] weeks gestation of pregnancy   2. Physically well but worried    Discussed with patient fetal growth at this gestation and that we may or not be able to see a fetus at this point. Patient desires ultrasound to be done outpatient. Discussed timing of ultrasound and patient agreeable with plan of care. Desires to  be called with results of bloodwork and declines vaginal swabs.  P Discharge from MAU in stable condition Order placed for outpatient ultrasound List of options for follow-up given  Warning signs for worsening condition that would warrant emergency follow-up discussed Patient may return to MAU as needed   Wende Mott, [place] 06/23/2021 1:53 PM

## 2021-06-26 ENCOUNTER — Encounter: Payer: Self-pay | Admitting: Medical

## 2021-06-26 ENCOUNTER — Other Ambulatory Visit: Payer: Self-pay

## 2021-06-26 ENCOUNTER — Encounter (HOSPITAL_COMMUNITY): Payer: Self-pay | Admitting: Obstetrics & Gynecology

## 2021-06-26 ENCOUNTER — Inpatient Hospital Stay (HOSPITAL_COMMUNITY): Payer: 59

## 2021-06-26 ENCOUNTER — Inpatient Hospital Stay (HOSPITAL_COMMUNITY)
Admission: AD | Admit: 2021-06-26 | Discharge: 2021-06-26 | Discharge status: Against Medical Advice | Payer: 59 | Attending: Obstetrics & Gynecology | Admitting: Obstetrics & Gynecology

## 2021-06-26 DIAGNOSIS — O26891 Other specified pregnancy related conditions, first trimester: Secondary | ICD-10-CM | POA: Insufficient documentation

## 2021-06-26 DIAGNOSIS — R109 Unspecified abdominal pain: Secondary | ICD-10-CM

## 2021-06-26 DIAGNOSIS — Z79899 Other long term (current) drug therapy: Secondary | ICD-10-CM | POA: Insufficient documentation

## 2021-06-26 DIAGNOSIS — O26851 Spotting complicating pregnancy, first trimester: Secondary | ICD-10-CM | POA: Insufficient documentation

## 2021-06-26 DIAGNOSIS — Z3A09 9 weeks gestation of pregnancy: Secondary | ICD-10-CM

## 2021-06-26 DIAGNOSIS — O4691 Antepartum hemorrhage, unspecified, first trimester: Secondary | ICD-10-CM | POA: Diagnosis not present

## 2021-06-26 DIAGNOSIS — R1031 Right lower quadrant pain: Secondary | ICD-10-CM | POA: Insufficient documentation

## 2021-06-26 DIAGNOSIS — O26899 Other specified pregnancy related conditions, unspecified trimester: Secondary | ICD-10-CM

## 2021-06-26 LAB — COMPREHENSIVE METABOLIC PANEL
ALT: 18 U/L (ref 0–44)
AST: 18 U/L (ref 15–41)
Albumin: 3.7 g/dL (ref 3.5–5.0)
Alkaline Phosphatase: 80 U/L (ref 38–126)
Anion gap: 6 (ref 5–15)
BUN: 6 mg/dL (ref 6–20)
CO2: 26 mmol/L (ref 22–32)
Calcium: 9.3 mg/dL (ref 8.9–10.3)
Chloride: 103 mmol/L (ref 98–111)
Creatinine, Ser: 0.77 mg/dL (ref 0.44–1.00)
GFR, Estimated: >60 mL/min (ref >60)
Glucose, Bld: 108 mg/dL — ABNORMAL HIGH (ref 70–99)
Potassium: 4.4 mmol/L (ref 3.5–5.1)
Sodium: 135 mmol/L (ref 135–145)
Total Bilirubin: 0.6 mg/dL (ref 0.3–1.2)
Total Protein: 7 g/dL (ref 6.5–8.1)

## 2021-06-26 LAB — CBC
HCT: 40.7 % (ref 36.0–46.0)
Hemoglobin: 13 g/dL (ref 12.0–15.0)
MCH: 28.6 pg (ref 26.0–34.0)
MCHC: 31.9 g/dL (ref 30.0–36.0)
MCV: 89.5 fL (ref 80.0–100.0)
Platelets: 204 10*3/uL (ref 150–400)
RBC: 4.55 MIL/uL (ref 3.87–5.11)
RDW: 14.4 % (ref 11.5–15.5)
WBC: 6.3 10*3/uL (ref 4.0–10.5)
nRBC: 0 % (ref 0.0–0.2)

## 2021-06-26 LAB — HCG, QUANTITATIVE, PREGNANCY: hCG, Beta Chain, Quant, S: 25837 m[IU]/mL — ABNORMAL HIGH (ref <5)

## 2021-06-26 LAB — URINALYSIS, ROUTINE W REFLEX MICROSCOPIC
Bilirubin Urine: NEGATIVE
Glucose, UA: NEGATIVE mg/dL
Hgb urine dipstick: NEGATIVE
Ketones, ur: NEGATIVE mg/dL
Leukocytes,Ua: NEGATIVE
Nitrite: NEGATIVE
Protein, ur: NEGATIVE mg/dL
Specific Gravity, Urine: 1.017 (ref 1.005–1.030)
pH: 7 (ref 5.0–8.0)

## 2021-06-26 MED ORDER — ONDANSETRON 4 MG PO TBDP
8.0000 mg | ORAL_TABLET | Freq: Once | ORAL | Status: AC
  Administered 2021-06-26: 8 mg via ORAL
  Filled 2021-06-26: qty 2

## 2021-06-26 NOTE — MAU Note (Signed)
Presents with c/o cramping and spotting with wiping, reports spotting is intermittent and cramping has been for 4 days.

## 2021-06-26 NOTE — MAU Provider Note (Signed)
Patient [name] 35 y.o. [id number] [redacted]w[redacted]d  by certain LMP here with complaints of cramping and spotting (1 time). She reports vomiting once a day, she denies constipation, diarrhea. She denies other abnormal vaginal discharge, dysuria, fever, SOB. She reports that she is very anxious about her pregnancy; she had a miscarriage in January and is very concerned about this pregnancy.  History     CSN: [id number]  Arrival date and time: 06/26/21 0730   Event Date/Time   First Provider Initiated Contact with Patient 06/26/21 [phone]      Chief Complaint  Patient presents with   Cramping   Spotting   Abdominal Pain This is a new problem. The current episode started in the past 7 days. The problem occurs intermittently. The problem has been gradually worsening. The pain is located in the RLQ. The pain is at a severity of 7/10. The quality of the pain is cramping. The abdominal pain does not radiate. Associated symptoms include nausea. Pertinent negatives include no constipation, diarrhea, dysuria or fever.   OB History     Gravida  8   Para  5   Term  4   Preterm  1   AB  2   Living  5      SAB  2   IAB      Ectopic      Multiple  0   Live Births  5           Past Medical History:  Diagnosis Date   Anxiety    Asthma    inhaler PRN-hasn't used inhaler in months   Attention deficit hyperactivity disorder    Bipolar disorder [facility])    hospitalized as a teen for suicidal ideations and cutting   Depression    no complaints, doing well   Gastritis    GERD (gastroesophageal reflux disease)    History of gestational diabetes in prior pregnancy, currently pregnant 10/13/2017   History of preterm delivery after IOL for preeclampsia, currently pregnant 10/13/2017   Not a 17P candidate   Hx of varicella    Mild preeclampsia 02/09/2016   Vaginal Pap smear, abnormal     Past Surgical History:  Procedure Laterality Date   DILATION AND EVACUATION N/A 01/08/2021    Procedure: DILATATION AND EVACUATION;  Surgeon: Chancy Milroy, MD;  Location: [facility];  Service: Gynecology;  Laterality: N/A;   EYE MUSCLE SURGERY Bilateral    07/29/12   MOUTH SURGERY     PLANTAR FASCIA SURGERY     WISDOM TOOTH EXTRACTION      Family History  Problem Relation Age of Onset   Depression Mother    Colon cancer Mother    Colon polyps Mother    Depression Father    Lung cancer Father    Depression Brother    Liver disease Other    Kidney disease Other     Social History   Tobacco Use   Smoking status: Never   Smokeless tobacco: Never  Vaping Use   Vaping Use: Never used  Substance Use Topics   Alcohol use: No    Alcohol/week: 0.0 standard drinks   Drug use: No    Allergies:  Allergies  Allergen Reactions   Depakote [Divalproex Sodium] Other (See Comments)    Pt states that this medication makes her blood levels toxic.     Methylphenidate Derivatives Other (See Comments)    Reaction:  Depression and anger    Neurontin [Gabapentin]  Other (See Comments)    Reaction:  Dizziness    Prozac [Fluoxetine Hcl] Other (See Comments)    Reaction:  Anger    Methylphenidate Hcl     SAME AS PREVIOUS LISTED METHYLPHENIDATE    Medications Prior to Admission  Medication Sig Dispense Refill Last Dose   acetaminophen (TYLENOL) 500 MG tablet Take 500 mg by mouth every 6 (six) hours as needed.      albuterol (VENTOLIN HFA) 108 (90 Base) MCG/ACT inhaler Inhale 1-2 puffs into the lungs every 6 (six) hours as needed for wheezing or shortness of breath. 18 g 0    ALPRAZolam (XANAX) 0.5 MG tablet Take 0.5 mg by mouth at bedtime as needed for anxiety.      ARIPiprazole (ABILIFY IM) Inject into the muscle every 30 (thirty) days.      ARIPiprazole (ABILIFY) 2 MG tablet Take 2 mg by mouth at bedtime.      chlorpheniramine-HYDROcodone (TUSSIONEX PENNKINETIC ER) 10-8 MG/5ML SUER Take 5 mLs by mouth every 12 (twelve) hours as needed for cough. 50 mL 0     guaifenesin (ROBITUSSIN) 100 MG/5ML syrup Take 10 mLs (200 mg total) by mouth 3 (three) times daily as needed for cough. 120 mL 0    Prenatal Vit-Fe Fumarate-FA (PRENATAL MULTIVITAMIN) TABS tablet Take 1 tablet by mouth daily at 12 noon.      promethazine (PHENERGAN) 25 MG tablet Take 1 tablet (25 mg total) by mouth every 6 (six) hours as needed for nausea or vomiting. 30 tablet 0    promethazine-dextromethorphan (PROMETHAZINE-DM) 6.25-15 MG/5ML syrup Take 5 mLs by mouth 4 (four) times daily as needed for cough. 100 mL 0    Sertraline HCl (ZOLOFT PO) Take 100 mg by mouth at bedtime.       Review of Systems  Constitutional: Negative.  Negative for fever.  Respiratory: Negative.    Gastrointestinal:  Positive for abdominal pain and nausea. Negative for constipation and diarrhea.  Genitourinary:  Positive for vaginal bleeding. Negative for dysuria.  Neurological: Negative.   Physical Exam   Blood pressure 124/79, pulse 97, temperature 98.7 F (37.1 C), temperature source Oral, resp. rate 20, height 5\' 3"  (1.6 m), weight 113.1 kg, last menstrual period 04/21/2021, SpO2 98 %, not currently breastfeeding.  Physical Exam Constitutional:      Appearance: Normal appearance.  Abdominal:     General: Abdomen is flat.  Musculoskeletal:        General: Normal range of motion.  Skin:    General: Skin is warm.  Neurological:     Mental Status: She is alert.  Declined pelvic exam.   MAU Course  Procedures  MDM -patient had [place] and viewed initial images with provider in MAU room; patient then reported that she needed to leave. She declined physical exam, declined to wait for blood work, requesting that she be called with results. I explained that I was not comfortable with her leaving, but that she is free to sign out AMA and look at her results on My Chart later. I explained that ectopic has not been ruled out and that she may need to come back for further work-up.  -patient had Zofran and feels  better  -declines STI/wet prep  Assessment and Plan  -patient signed out [place] with labs and [place] report pending -RX not given for Zofran -will cancel upcoming [place] appt on 7-18; message has been sent to cancel  Mervyn Skeeters Adele Milson 06/26/2021, 8:30 AM

## 2021-06-26 NOTE — MAU Note (Signed)
Pt back from u/s and would like to leave. Requesting that CNM discharge her and call her with results. Discussed with CNM and CNM states she cannot discharge her due to no results being back yet. Pt informed if she would like to leave she will need to sign out AMA. Pt requesting provider to bedside, [place] CNM informed.

## 2021-06-26 NOTE — Progress Notes (Signed)
Left AMA, states unable to wait for test results.  States ride is here and she needs to leave. Ambulated off unit in stable condition.

## 2021-06-27 ENCOUNTER — Telehealth: Payer: Self-pay

## 2021-06-27 NOTE — Telephone Encounter (Signed)
Pt calling to ask about [place] that was performed in ER on 06/26/21.  Pt asking about EDD and if [place] was normal.   Advised pt will be contacted by Maye Hides, CNM for follow up questions. Pt verbalized understanding to plan of care.   Colletta [place], RN

## 2021-06-28 ENCOUNTER — Telehealth: Payer: Self-pay | Admitting: *Deleted

## 2021-06-28 ENCOUNTER — Encounter: Payer: Self-pay | Admitting: *Deleted

## 2021-06-28 DIAGNOSIS — O219 Vomiting of pregnancy, unspecified: Secondary | ICD-10-CM

## 2021-06-28 MED ORDER — DOXYLAMINE-PYRIDOXINE 10-10 MG PO TBEC: DELAYED_RELEASE_TABLET | ORAL | 2 refills | Status: DC

## 2021-06-28 NOTE — Telephone Encounter (Signed)
[name] called office and asked for nurse to call her back. I called her and she reports nausea that is getting worse and Phenergan not working. We discussed options of Diclegis and Vitamin B6 with [employer]. She prefers I send in RX for Diclegis and if it is not covered by her [place], she will start the Vitamin B6 and and Unisom and will follow up with [place] as needed.   She also wanted to verify how far along she was when she lost her baby and how far along she is now and explain results again of her latest [place]. I reviewed with her and gave support. I encouraged her to keep [place] appointment as scheduled and if she has bleeding like a period or severe pain before then- she will need to be evaluated. She voices understanding.  Maricela Kawahara,RN

## 2021-07-01 ENCOUNTER — Ambulatory Visit: Payer: 59

## 2021-07-02 ENCOUNTER — Encounter (HOSPITAL_COMMUNITY): Payer: Self-pay | Admitting: Obstetrics & Gynecology

## 2021-07-02 ENCOUNTER — Other Ambulatory Visit: Payer: Self-pay

## 2021-07-02 ENCOUNTER — Inpatient Hospital Stay (HOSPITAL_COMMUNITY)
Admission: AD | Admit: 2021-07-02 | Discharge: 2021-07-02 | Disposition: A | Discharge status: Home / Self Care | Payer: 59 | Attending: Obstetrics & Gynecology | Admitting: Obstetrics & Gynecology

## 2021-07-02 ENCOUNTER — Inpatient Hospital Stay (HOSPITAL_COMMUNITY): Payer: 59

## 2021-07-02 DIAGNOSIS — L292 Pruritus vulvae: Secondary | ICD-10-CM | POA: Diagnosis not present

## 2021-07-02 DIAGNOSIS — O219 Vomiting of pregnancy, unspecified: Secondary | ICD-10-CM | POA: Insufficient documentation

## 2021-07-02 DIAGNOSIS — N898 Other specified noninflammatory disorders of vagina: Secondary | ICD-10-CM | POA: Insufficient documentation

## 2021-07-02 DIAGNOSIS — O4691 Antepartum hemorrhage, unspecified, first trimester: Secondary | ICD-10-CM

## 2021-07-02 DIAGNOSIS — R109 Unspecified abdominal pain: Secondary | ICD-10-CM | POA: Insufficient documentation

## 2021-07-02 DIAGNOSIS — Z888 Allergy status to other drugs, medicaments and biological substances status: Secondary | ICD-10-CM | POA: Insufficient documentation

## 2021-07-02 DIAGNOSIS — O4690 Antepartum hemorrhage, unspecified, unspecified trimester: Secondary | ICD-10-CM

## 2021-07-02 DIAGNOSIS — Z3A01 Less than 8 weeks gestation of pregnancy: Secondary | ICD-10-CM | POA: Diagnosis not present

## 2021-07-02 DIAGNOSIS — O99711 Diseases of the skin and subcutaneous tissue complicating pregnancy, first trimester: Secondary | ICD-10-CM | POA: Diagnosis not present

## 2021-07-02 DIAGNOSIS — Z3491 Encounter for supervision of normal pregnancy, unspecified, first trimester: Secondary | ICD-10-CM

## 2021-07-02 DIAGNOSIS — O26891 Other specified pregnancy related conditions, first trimester: Secondary | ICD-10-CM | POA: Insufficient documentation

## 2021-07-02 DIAGNOSIS — O209 Hemorrhage in early pregnancy, unspecified: Secondary | ICD-10-CM | POA: Insufficient documentation

## 2021-07-02 LAB — URINALYSIS, ROUTINE W REFLEX MICROSCOPIC
Bilirubin Urine: NEGATIVE
Glucose, UA: NEGATIVE mg/dL
Ketones, ur: NEGATIVE mg/dL
Leukocytes,Ua: NEGATIVE
Nitrite: NEGATIVE
Protein, ur: NEGATIVE mg/dL
Specific Gravity, Urine: 1.018 (ref 1.005–1.030)
pH: 7 (ref 5.0–8.0)

## 2021-07-02 LAB — WET PREP, GENITAL
Clue Cells Wet Prep HPF POC: NONE SEEN
Sperm: NONE SEEN
Trich, Wet Prep: NONE SEEN
Yeast Wet Prep HPF POC: NONE SEEN

## 2021-07-02 MED ORDER — PROMETHAZINE HCL 25 MG PO TABS
25.0000 mg | ORAL_TABLET | Freq: Once | ORAL | Status: AC
  Administered 2021-07-02: 25 mg via ORAL
  Filled 2021-07-02: qty 1

## 2021-07-02 MED ORDER — PROMETHAZINE HCL 25 MG PO TABS: 25.0000 mg | ORAL_TABLET | Freq: Four times a day (QID) | ORAL | 0 refills | Status: DC | PRN

## 2021-07-02 MED ORDER — TERCONAZOLE 0.4 % VA CREA: 1.0000 | TOPICAL_CREAM | Freq: Every day | VAGINAL | 0 refills | Status: DC

## 2021-07-02 NOTE — MAU Note (Addendum)
.  [name] is a 35 y.o. at [redacted]w[redacted]d here in MAU reporting: Spotting that started last night when she wipes. Also reports lower abdominal cramping that is off and on. No recent intercourse. Also reports she is constipated, has not had a BM in 3 days. Pain is 5/10 in her lower abdomen. Also reporting nausea and vomiting x1. Took phenergan last night.   Vitals:   07/02/21 1102  BP: 134/90  Pulse: 90  Resp: 19  Temp: 98.1 F (36.7 C)  SpO2: 100%      Lab orders placed from triage:  UA

## 2021-07-02 NOTE — MAU Provider Note (Signed)
Chief Complaint:  Vaginal Bleeding   Event Date/Time   First Provider Initiated Contact with Patient 07/02/21 1119     HPI: [name] is a 35 y.o. [id number] at [redacted]w[redacted]d who presents to maternity admissions reporting abdominal cramping and vaginal spotting. Patient reports ongoing, intermittent cramping that she rates 5/10. She took 1 regular strength Tylenol at 9am, which did not help. This morning, she noticed brown vaginal blood when she wipes. She did not have to wear a pad. She is also reporting increase in clear, thin vaginal discharge that itches and is irritating. Has a cut on her vagina from scratching. Denies history of HSV.    Pregnancy Course:   Past Medical History:  Diagnosis Date   Anxiety    Asthma    inhaler PRN-hasn't used inhaler in months   Attention deficit hyperactivity disorder    Bipolar disorder [facility])    hospitalized as a teen for suicidal ideations and cutting   Depression    no complaints, doing well   Gastritis    GERD (gastroesophageal reflux disease)    History of gestational diabetes in prior pregnancy, currently pregnant 10/13/2017   History of preterm delivery after IOL for preeclampsia, currently pregnant 10/13/2017   Not a 17P candidate   Hx of varicella    Mild preeclampsia 02/09/2016   Vaginal Pap smear, abnormal    OB History  Gravida Para Term Preterm AB Living  8 5 4 1 2 5   SAB IAB Ectopic Multiple Live Births  2     0 5    # Outcome Date GA Lbr Len/2nd Weight Sex Delivery Anes PTL Lv  8 Current           7 Term 03/16/20 [redacted]w[redacted]d / 00:14 3235 g F Vag-Spont EPI  LIV  6 SAB 05/2019          5 Term 12/17/18 [redacted]w[redacted]d  2722 g F Vag-Spont   LIV     Birth Comments: Hypertension  4 Term 02/19/18 [redacted]w[redacted]d 946:02 / 01:46 3790 g M Vag-Spont EPI  LIV     Birth Comments: hypertension  3 Preterm 02/03/17 [redacted]w[redacted]d  3400 g M Vag-Spont EPI  LIV     Birth Comments: hypertension  2 Term 02/11/16 [redacted]w[redacted]d / 01:14 3702 g F Vag-Spont EPI  LIV     Birth Comments:  preeclampsia  1 SAB      SAB      Past Surgical History:  Procedure Laterality Date   DILATION AND EVACUATION N/A 01/08/2021   Procedure: DILATATION AND EVACUATION;  Surgeon: Chancy Milroy, MD;  Location: [facility];  Service: Gynecology;  Laterality: N/A;   EYE MUSCLE SURGERY Bilateral    07/29/12   MOUTH SURGERY     PLANTAR FASCIA SURGERY     WISDOM TOOTH EXTRACTION     Family History  Problem Relation Age of Onset   Depression Mother    Colon cancer Mother    Colon polyps Mother    Depression Father    Lung cancer Father    Depression Brother    Liver disease Other    Kidney disease Other    Social History   Tobacco Use   Smoking status: Never   Smokeless tobacco: Never  Vaping Use   Vaping Use: Never used  Substance Use Topics   Alcohol use: No    Alcohol/week: 0.0 standard drinks   Drug use: No   Allergies  Allergen Reactions   Depakote [Divalproex Sodium]  Other (See Comments)    Pt states that this medication makes her blood levels toxic.     Methylphenidate Derivatives Other (See Comments)    Reaction:  Depression and anger    Neurontin [Gabapentin] Other (See Comments)    Reaction:  Dizziness    Prozac [Fluoxetine Hcl] Other (See Comments)    Reaction:  Anger    Methylphenidate Hcl     SAME AS PREVIOUS LISTED METHYLPHENIDATE   No medications prior to admission.   I have reviewed patient's Past Medical Hx, Surgical Hx, Family Hx, Social Hx, medications and allergies.   ROS:  Review of Systems  Constitutional: Negative.   Respiratory: Negative.    Cardiovascular: Negative.   Gastrointestinal:  Positive for abdominal pain (cramping).  Genitourinary:  Positive for vaginal bleeding (brown spotting) and vaginal discharge (clear, thin).       Vaginal itching   Musculoskeletal: Negative.   Neurological: Negative.   Psychiatric/Behavioral: Negative.     Physical Exam  Patient Vitals for the past 24 hrs:  BP Temp Temp src Pulse Resp  SpO2  07/02/21 1248 (!) 131/55 -- -- 77 14 100 %  07/02/21 1116 126/87 -- -- 75 -- --  07/02/21 1102 134/90 98.1 F (36.7 C) Oral 90 19 100 %   Constitutional: well-developed, well-nourished female, tearful Cardiovascular: normal rate Respiratory: normal effort GI: abd soft, non-tender MS: extremities nontender, no edema, normal ROM Neurologic: alert and oriented x 4.  Pelvic: external genitalia appears slightly red and irritated, no lesions, pt declines pelvic but agreeable to blind swabs. Small amount of brown discharge on cotton swabs   Labs: Results for orders placed or performed during the hospital encounter of 07/02/21 (from the past 24 hour(s))  Urinalysis, Routine w reflex microscopic Urine, Clean Catch     Status: Abnormal   Collection Time: 07/02/21 11:12 AM  Result Value Ref Range   Color, Urine YELLOW YELLOW   APPearance CLEAR CLEAR   Specific Gravity, Urine 1.018 1.005 - 1.030   pH 7.0 5.0 - 8.0   Glucose, UA NEGATIVE NEGATIVE mg/dL   Hgb urine dipstick MODERATE (A) NEGATIVE   Bilirubin Urine NEGATIVE NEGATIVE   Ketones, ur NEGATIVE NEGATIVE mg/dL   Protein, ur NEGATIVE NEGATIVE mg/dL   Nitrite NEGATIVE NEGATIVE   Leukocytes,Ua NEGATIVE NEGATIVE   RBC / HPF 0-5 0 - 5 RBC/hpf   WBC, UA 0-5 0 - 5 WBC/hpf   Bacteria, UA RARE (A) NONE SEEN   Squamous Epithelial / LPF 0-5 0 - 5   Mucus PRESENT   Wet prep, genital     Status: Abnormal   Collection Time: 07/02/21 11:32 AM   Specimen: PATH Cytology Cervicovaginal Ancillary Only  Result Value Ref Range   Yeast Wet Prep HPF POC NONE SEEN NONE SEEN   Trich, Wet Prep NONE SEEN NONE SEEN   Clue Cells Wet Prep HPF POC NONE SEEN NONE SEEN   WBC, Wet Prep HPF POC MANY (A) NONE SEEN   Sperm NONE SEEN     Imaging:  US OB Transvaginal  Result Date: 07/02/2021 CLINICAL DATA:  Positive pregnancy test with vaginal bleeding. EXAM: OBSTETRIC <14 WK [place] AND TRANSVAGINAL OB US TECHNIQUE: Both transabdominal and transvaginal  ultrasound examinations were performed for complete evaluation of the gestation as well as the maternal uterus, adnexal regions, and pelvic cul-de-sac. Transvaginal technique was performed to assess early pregnancy. COMPARISON:  06/26/2021 FINDINGS: Intrauterine gestational sac: Single. Yolk sac:  Visualized. Embryo:  Visualized. Cardiac Activity: Visualized. Heart  Rate: 123 bpm CRL: 9.2 mm   6 w   6 d                  [place] EDC: 02/19/2022 Subchorionic hemorrhage:  None visualized. Maternal uterus/adnexae: Unremarkable. IMPRESSION: Single living intrauterine gestation at estimated 6 week 6 day gestational age by crown-rump length. Electronically Signed   By: Misty Stanley M.D.   On: 07/02/2021 12:40       MAU Course: Orders Placed This Encounter  Procedures   Wet prep, genital   US OB Transvaginal   Urinalysis, Routine w reflex microscopic Urine, Clean Catch   Discharge patient   Meds ordered this encounter  Medications   promethazine (PHENERGAN) tablet 25 mg   promethazine (PHENERGAN) 25 MG tablet    Sig: Take 1 tablet (25 mg total) by mouth every 6 (six) hours as needed for nausea or vomiting.    Dispense:  30 tablet    Refill:  0    Order Specific Question:   Supervising Provider    Answer:   Donnamae Jude [[id number]]   terconazole (TERAZOL 7) 0.4 % vaginal cream    Sig: Place 1 applicator vaginally at bedtime. Use for seven days    Dispense:  45 g    Refill:  0    Order Specific Question:   Supervising Provider    Answer:   Donnamae Jude [[id number]]     MDM: UA, wet prep, GC/CT collected [place] with results as above. IUP with FHR at 123bpm During initial exam, patient is requesting that provider call her with her results as her husband has an appointment. I explained to patient that I would not call her with the results as I am not comfortable with that and that if she needed to leave she would have to sign an AMA form. At this time patient is agreeable to waiting for results Patient requested  something for nausea during stay, so phenergan PO was ordered Rx for vaginal itching and nausea sent to pharmacy  Assessment: 1. Normal IUP (intrauterine pregnancy) on prenatal ultrasound, first trimester   2. Vaginal bleeding during pregnancy   3. [redacted] weeks gestation of pregnancy   4. Vaginal itching   5. Nausea and vomiting during pregnancy     Plan: Discharge home in stable condition  Keep NOB appointment as scheduled Return to MAU as needed for emergencies   Follow-up [place] for [place] at [facility] Follow up.   Specialty: Obstetrics and Gynecology Why: as scheduled. Return to MAU as needed. Contact information: [address] [place] [address] [phone]                Allergies as of 07/02/2021       Reactions   Depakote [divalproex Sodium] Other (See Comments)   Pt states that this medication makes her blood levels toxic.     Methylphenidate Derivatives Other (See Comments)   Reaction:  Depression and anger    Neurontin [gabapentin] Other (See Comments)   Reaction:  Dizziness    Prozac [fluoxetine Hcl] Other (See Comments)   Reaction:  Anger    Methylphenidate Hcl    SAME AS PREVIOUS LISTED METHYLPHENIDATE        Medication List     TAKE these medications    acetaminophen 500 MG tablet Commonly known as: TYLENOL Take 500 mg by mouth every 6 (six) hours as needed.   albuterol 108 (  90 Base) MCG/ACT inhaler Commonly known as: VENTOLIN HFA Inhale 1-2 puffs into the lungs every 6 (six) hours as needed for wheezing or shortness of breath.   ALPRAZolam 0.5 MG tablet Commonly known as: XANAX Take 0.5 mg by mouth at bedtime as needed for anxiety.   ARIPiprazole 2 MG tablet Commonly known as: ABILIFY Take 2 mg by mouth at bedtime.   ABILIFY IM Inject into the muscle every 30 (thirty) days.   chlorpheniramine-HYDROcodone 10-8 MG/5ML Suer Commonly known as: Tussionex Pennkinetic  ER Take 5 mLs by mouth every 12 (twelve) hours as needed for cough.   Doxylamine-Pyridoxine 10-10 MG Tbec Commonly known as: Diclegis Take 2 tablets at bedtime. If this does not relieve your symptoms of nausea and vomiting after several days you may take one additional tablet at breakfast. If this does not relieve your symptoms after several days you may take one additional tablet at lunch..   guaifenesin 100 MG/5ML syrup Commonly known as: ROBITUSSIN Take 10 mLs (200 mg total) by mouth 3 (three) times daily as needed for cough.   prenatal multivitamin Tabs tablet Take 1 tablet by mouth daily at 12 noon.   promethazine 25 MG tablet Commonly known as: PHENERGAN Take 1 tablet (25 mg total) by mouth every 6 (six) hours as needed for nausea or vomiting.   promethazine-dextromethorphan 6.25-15 MG/5ML syrup Commonly known as: PROMETHAZINE-DM Take 5 mLs by mouth 4 (four) times daily as needed for cough.   terconazole 0.4 % vaginal cream Commonly known as: TERAZOL 7 Place 1 applicator vaginally at bedtime. Use for seven days   ZOLOFT PO Take 100 mg by mouth at bedtime.          Maryagnes Amos, MSN, CNM 07/02/2021 12:57 PM

## 2021-07-02 NOTE — MAU Note (Signed)
Patient states she has a cut on her vagina from "itching so bad." Patient states her symptoms began two days ago. She denies any changes to her vaginal discharge. She states she denies any foul odors and states her vaginal discharge is clear.

## 2021-07-03 ENCOUNTER — Other Ambulatory Visit: Payer: Self-pay

## 2021-07-03 ENCOUNTER — Ambulatory Visit: Payer: 59

## 2021-07-03 ENCOUNTER — Telehealth (INDEPENDENT_AMBULATORY_CARE_PROVIDER_SITE_OTHER): Payer: 59 | Admitting: *Deleted

## 2021-07-03 DIAGNOSIS — Z8759 Personal history of other complications of pregnancy, childbirth and the puerperium: Secondary | ICD-10-CM

## 2021-07-03 DIAGNOSIS — O09299 Supervision of pregnancy with other poor reproductive or obstetric history, unspecified trimester: Secondary | ICD-10-CM

## 2021-07-03 DIAGNOSIS — Z641 Problems related to multiparity: Secondary | ICD-10-CM

## 2021-07-03 DIAGNOSIS — O09899 Supervision of other high risk pregnancies, unspecified trimester: Secondary | ICD-10-CM

## 2021-07-03 DIAGNOSIS — O0940 Supervision of pregnancy with grand multiparity, unspecified trimester: Secondary | ICD-10-CM

## 2021-07-03 DIAGNOSIS — Z3A00 Weeks of gestation of pregnancy not specified: Secondary | ICD-10-CM

## 2021-07-03 DIAGNOSIS — O0990 Supervision of high risk pregnancy, unspecified, unspecified trimester: Secondary | ICD-10-CM

## 2021-07-03 DIAGNOSIS — O09219 Supervision of pregnancy with history of pre-term labor, unspecified trimester: Secondary | ICD-10-CM

## 2021-07-03 HISTORY — DX: Personal history of other complications of pregnancy, childbirth and the puerperium: Z87.59

## 2021-07-03 HISTORY — DX: Supervision of pregnancy with other poor reproductive or obstetric history, unspecified trimester: O09.299

## 2021-07-03 LAB — GC/CHLAMYDIA PROBE AMP (~~LOC~~) NOT AT ARMC
Chlamydia: NEGATIVE
Comment: NEGATIVE
Comment: NORMAL
Neisseria Gonorrhea: NEGATIVE

## 2021-07-03 NOTE — Progress Notes (Signed)
New OB Intake  I connected with  Niasha R Foulk on 07/03/21 at 3:22 by MyChart Video Visit and verified that I am speaking with the correct person using two identifiers. Nurse is located at [facility] and pt is located at home .  I discussed the limitations, risks, security and privacy concerns of performing an evaluation and management service by telephone and the availability of in person appointments. I also discussed with the patient that there may be a patient responsible charge related to this service. The patient expressed understanding and agreed to proceed.  I explained I am completing New OB Intake today. We discussed her EDD of 02/18/22 that is based on LMP of 05/14/21. Pt is G8/P4125. I reviewed her allergies, medications, Medical/Surgical/OB history, and appropriate screenings. I informed her of [facility] services. Based on history, this is a/an  pregnancy complicated by Multigravida, History of Pre-eclampsia, History GDM, History PTD,   .   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   MyChart/Babyscripts MyChart access verified. I explained pt will have some visits in office and some virtually. Babyscripts instructions given and order placed. Patient verifies receipt of registration text/e-mail.   Blood Pressure Cuff  Patient states she has a blood pressure cuff and knows how to use it.  Explained after first prenatal appt pt will check weekly and document in 47.  Weight scale: Patient states she does have weight scale.   Anatomy US Explained first scheduled [place] will be around 19 weeks. Anatomy US scheduled for 09/27/21 at 0245. Pt notified to arrive at [place].  Labs Discussed Johnsie Cancel genetic screening with patient. Would like both Panorama drawn at new OB visit. Routine prenatal labs needed.  Covid Vaccine Patient has covid vaccine.   Mother/ Baby Dyad Candidate?    If yes, offer as possibility  Informed patient of [facility] website  and placed link in her AVS.   Social Determinants of Health Food Insecurity: Patient expresses food insecurity. Food Market information given to patient; explained patient may visit at the end of first OB appointment. WIC Referral: Patient is not interested in referral to [facility].  States she has [place].  Transportation: Patient denies transportation needs. Childcare: Discussed no children allowed at ultrasound appointments. Offered childcare services; patient declines childcare services at this time.   Placed OB Box on problem list and updated  First visit review I reviewed new OB appt with pt. I explained she will have a pelvic exam, ob bloodwork with genetic screening,  Explained pt will be seen by Kerry Hough, PA at first visit; encounter routed  to appropriate provider. Explained that patient will be seen by pregnancy navigator following visit with provider. [facility] information placed in AVS.   Shere Eisenhart,RN 07/03/2021  4:04 PM

## 2021-07-03 NOTE — Progress Notes (Signed)
Chart reviewed for nurse visit. Agree with plan of care.   Jorje Guild, NP 07/03/2021 6:12 PM

## 2021-07-03 NOTE — Patient Instructions (Signed)
  At our [facility] OB/GYN Practices, we work as an integrated team, providing care to address both physical and emotional health. Your medical provider may refer you to see our [facility]) on the same day you see your medical provider, as availability permits; often scheduled virtually at your convenience.  Our [facility] is available to all patients, visits generally last between 20-30 minutes, but can be longer or shorter, depending on patient need. The [facility] offers help with stress management, coping with symptoms of depression and anxiety, major life changes , sleep issues, changing risky behavior, grief and loss, life stress, working on personal life goals, and  behavioral health issues, as these all affect your overall health and wellness.  The [facility] is NOT available for the following: FMLA paperwork, court-ordered evaluations, specialty assessments (custody or disability), letters to employers, or obtaining certification for an emotional support animal. The [facility] does not provide long-term therapy. You have the right to refuse integrated behavioral health services, or to reschedule to see the [facility] at a later date.  Confidentiality exception: If it is suspected that a child or disabled adult is being abused or neglected, we are required by law to report that to either Child Protective Services or Adult Scientist, forensic.  If you have a diagnosis of Bipolar affective disorder, Schizophrenia, or recurrent Major depressive disorder, we will recommend that you establish care with a psychiatrist, as these are lifelong, chronic conditions, and we want your overall emotional health and medications to be more closely monitored. If you anticipate needing extended maternity leave due to mental health issues postpartum, it it recommended you inform your medical provider, so we can put in a referral to a psychiatrist as soon as possible. The [facility] is unable to recommend an extended maternity leave for mental  health issues. Your medical provider or [facility] may refer you to a therapist for ongoing, traditional therapy, or to a psychiatrist, for medication management, if it would benefit your overall health. Depending on your insurance, you may have a copay or be charged a deductible, depending on your insurance, to see the [facility]. If you are uninsured, it is recommended that you apply for financial assistance. (Forms may be requested at the front desk for in-person visits, via MyChart, or request a form during a virtual visit).  If you see the [facility] more than 6 times, you will have to complete a comprehensive clinical assessment interview with the [facility] to resume integrated services.  For virtual visits with the [facility], you must be physically in the state of [place] at the time of the visit. For example, if you live in [place], you will have to do an in-person visit with the [facility], and your out-of-state insurance may not cover behavioral health services in [place]. If you are going out of the state or country for any reason, the [facility] may see you virtually when you return to [place], but not while you are physically outside of [place].    [email or web address] is a Microbiologist

## 2021-07-05 ENCOUNTER — Telehealth: Payer: Self-pay

## 2021-07-05 NOTE — Telephone Encounter (Signed)
Patient called in stating that she is experiencing dizziness and lightheadedness. Patient states this issue is ongoing and without relief. Requesting call back ASAP from nurse.

## 2021-07-05 NOTE — Telephone Encounter (Signed)
I called Alvenia back and she confirms she is feeling some dizziness and lightheadedness and weakness. She confirms she is taking the phenergan as needed but is not throwing up but once a day.  She states the Diclegis is not covered by her insurance.  We discussed she needs to be drinking extra fluids since she is pregnant and it is hot summertime. She confirms she is drinking lots of water . She states she is not eating much because of no appetite. I encouraged her to eat smaller meals and small snacks every 2-3 hours to that she is eating about 5-6 times a day and this may help.   I informed her that her hemoglobin was normal on 06/26/21 at 13.0. I advised her if she feels worse and /or passing out she will need to be evaluated. She states it is not to that point. She thanked me for calling her back. Dakarri Kessinger,RN

## 2021-07-07 ENCOUNTER — Other Ambulatory Visit: Payer: Self-pay

## 2021-07-07 ENCOUNTER — Encounter (HOSPITAL_COMMUNITY): Payer: Self-pay | Admitting: Obstetrics and Gynecology

## 2021-07-07 ENCOUNTER — Inpatient Hospital Stay (HOSPITAL_COMMUNITY)
Admission: AD | Admit: 2021-07-07 | Discharge: 2021-07-07 | Disposition: A | Discharge status: Home / Self Care | Payer: 59 | Attending: Obstetrics and Gynecology | Admitting: Obstetrics and Gynecology

## 2021-07-07 DIAGNOSIS — R102 Pelvic and perineal pain: Secondary | ICD-10-CM | POA: Diagnosis not present

## 2021-07-07 DIAGNOSIS — Z6740 Type O blood, Rh positive: Secondary | ICD-10-CM | POA: Diagnosis not present

## 2021-07-07 DIAGNOSIS — O209 Hemorrhage in early pregnancy, unspecified: Secondary | ICD-10-CM | POA: Insufficient documentation

## 2021-07-07 DIAGNOSIS — Z888 Allergy status to other drugs, medicaments and biological substances status: Secondary | ICD-10-CM | POA: Diagnosis not present

## 2021-07-07 DIAGNOSIS — Z3490 Encounter for supervision of normal pregnancy, unspecified, unspecified trimester: Secondary | ICD-10-CM

## 2021-07-07 DIAGNOSIS — Z3A01 Less than 8 weeks gestation of pregnancy: Secondary | ICD-10-CM

## 2021-07-07 DIAGNOSIS — O09291 Supervision of pregnancy with other poor reproductive or obstetric history, first trimester: Secondary | ICD-10-CM | POA: Diagnosis not present

## 2021-07-07 DIAGNOSIS — R109 Unspecified abdominal pain: Secondary | ICD-10-CM

## 2021-07-07 DIAGNOSIS — O26851 Spotting complicating pregnancy, first trimester: Secondary | ICD-10-CM

## 2021-07-07 DIAGNOSIS — O09211 Supervision of pregnancy with history of pre-term labor, first trimester: Secondary | ICD-10-CM | POA: Diagnosis not present

## 2021-07-07 DIAGNOSIS — Z79899 Other long term (current) drug therapy: Secondary | ICD-10-CM | POA: Insufficient documentation

## 2021-07-07 DIAGNOSIS — O26891 Other specified pregnancy related conditions, first trimester: Secondary | ICD-10-CM | POA: Insufficient documentation

## 2021-07-07 LAB — URINALYSIS, ROUTINE W REFLEX MICROSCOPIC
Bacteria, UA: NONE SEEN
Bilirubin Urine: NEGATIVE
Glucose, UA: NEGATIVE mg/dL
Ketones, ur: NEGATIVE mg/dL
Leukocytes,Ua: NEGATIVE
Nitrite: NEGATIVE
Protein, ur: 30 mg/dL — AB
Specific Gravity, Urine: 1.029 (ref 1.005–1.030)
pH: 5 (ref 5.0–8.0)

## 2021-07-07 LAB — CBC
HCT: 38.5 % (ref 36.0–46.0)
Hemoglobin: 12.5 g/dL (ref 12.0–15.0)
MCH: 28.9 pg (ref 26.0–34.0)
MCHC: 32.5 g/dL (ref 30.0–36.0)
MCV: 89.1 fL (ref 80.0–100.0)
Platelets: 187 10*3/uL (ref 150–400)
RBC: 4.32 MIL/uL (ref 3.87–5.11)
RDW: 14.1 % (ref 11.5–15.5)
WBC: 7.8 10*3/uL (ref 4.0–10.5)
nRBC: 0 % (ref 0.0–0.2)

## 2021-07-07 LAB — WET PREP, GENITAL
Clue Cells Wet Prep HPF POC: NONE SEEN
Sperm: NONE SEEN
Trich, Wet Prep: NONE SEEN
Yeast Wet Prep HPF POC: NONE SEEN

## 2021-07-07 LAB — OB RESULTS CONSOLE GC/CHLAMYDIA: Gonorrhea: NEGATIVE

## 2021-07-07 MED ORDER — ACETAMINOPHEN 500 MG PO TABS
1000.0000 mg | ORAL_TABLET | Freq: Once | ORAL | Status: AC
  Administered 2021-07-07: 1000 mg via ORAL
  Filled 2021-07-07: qty 2

## 2021-07-07 NOTE — MAU Note (Signed)
Pt left before signing dc forms or vs

## 2021-07-07 NOTE — MAU Note (Signed)
Bleeding noted this morning, more than just spotting.  Denies recent intercourse. Some mild cramping. Having a lot of weakness.

## 2021-07-07 NOTE — MAU Provider Note (Signed)
History     CSN: [id number]  Arrival date and time: 07/07/21 1220   Event Date/Time   First Provider Initiated Contact with Patient 07/07/21 1301      Chief Complaint  Patient presents with   Vaginal Bleeding   Abdominal Pain   HPI [name] is a 35 y.[id number] with confirmed IUP at 54w5dwho presents to MAU with chief complaints of vaginal spotting and suprapubic pain. These are new problems, onset this morning. Patient's pain is suprapubic, does not radiate. Pain score is 3/10. She has not taken medication or tried other treatments for this complaint. Bleeding is scant, dark brown. She is not seeing bright red bleeding. She is not experiencing acute symptoms like weakness, dizziness, syncope. Patient endorses orgasm without penetration just prior to onset of symptoms.    OB History     Gravida  8   Para  5   Term  4   Preterm  1   AB  2   Living  5      SAB  2   IAB      Ectopic      Multiple  0   Live Births  5           Past Medical History:  Diagnosis Date   Anxiety    Asthma    inhaler PRN-hasn't used inhaler in months   Attention deficit hyperactivity disorder    Bipolar disorder ([facility]    hospitalized as a teen for suicidal ideations and cutting   Depression    no complaints, doing well   Gastritis    GERD (gastroesophageal reflux disease)    History of gestational diabetes in prior pregnancy, currently pregnant 34/30/2018   History of preterm delivery after IOL for preeclampsia, currently pregnant 34/30/2018   Not a 17P candidate   Hx of varicella    Mild preeclampsia 02/09/2016   Vaginal Pap smear, abnormal     Past Surgical History:  Procedure Laterality Date   DILATION AND EVACUATION N/A 01/08/2021   Procedure: DILATATION AND EVACUATION;  Surgeon: EChancy Milroy MD;  Location: [facility]  Service: Gynecology;  Laterality: N/A;   EYE MUSCLE SURGERY Bilateral    07/29/12   MOUTH SURGERY     PLANTAR FASCIA  SURGERY     WISDOM TOOTH EXTRACTION      Family History  Problem Relation Age of Onset   Depression Mother    Colon cancer Mother    Colon polyps Mother    Depression Father    Lung cancer Father    Depression Brother    Liver disease Other    Kidney disease Other     Social History   Tobacco Use   Smoking status: Never   Smokeless tobacco: Never  Vaping Use   Vaping Use: Never used  Substance Use Topics   Alcohol use: No    Alcohol/week: 0.0 standard drinks   Drug use: No    Allergies:  Allergies  Allergen Reactions   Depakote [Divalproex Sodium] Other (See Comments)    Pt states that this medication makes her blood levels toxic.     Methylphenidate Derivatives Other (See Comments)    Reaction:  Depression and anger    Neurontin [Gabapentin] Other (See Comments)    Reaction:  Dizziness    Prozac [Fluoxetine Hcl] Other (See Comments)    Reaction:  Anger    Methylphenidate Hcl     SAME AS PREVIOUS LISTED METHYLPHENIDATE  Medications Prior to Admission  Medication Sig Dispense Refill Last Dose   acetaminophen (TYLENOL) 500 MG tablet Take 500 mg by mouth every 6 (six) hours as needed.      albuterol (VENTOLIN HFA) 108 (90 Base) MCG/ACT inhaler Inhale 1-2 puffs into the lungs every 6 (six) hours as needed for wheezing or shortness of breath. (Patient not taking: Reported on 07/03/2021) 18 g 0    ALPRAZolam (XANAX) 0.5 MG tablet Take 0.5 mg by mouth at bedtime as needed for anxiety.      ARIPiprazole (ABILIFY IM) Inject into the muscle every 30 (thirty) days.      ARIPiprazole (ABILIFY) 2 MG tablet Take 2 mg by mouth at bedtime.      chlorpheniramine-HYDROcodone (TUSSIONEX PENNKINETIC ER) 10-8 MG/5ML SUER Take 5 mLs by mouth every 12 (twelve) hours as needed for cough. 50 mL 0    Doxylamine-Pyridoxine (DICLEGIS) 10-10 MG TBEC Take 2 tablets at bedtime. If this does not relieve your symptoms of nausea and vomiting after several days you may take one additional tablet at  breakfast. If this does not relieve your symptoms after several days you may take one additional tablet at lunch.. (Patient not taking: Reported on 07/03/2021) 120 tablet 2    guaifenesin (ROBITUSSIN) 100 MG/5ML syrup Take 10 mLs (200 mg total) by mouth 3 (three) times daily as needed for cough. 120 mL 0    Prenatal Vit-Fe Fumarate-FA (PRENATAL MULTIVITAMIN) TABS tablet Take 1 tablet by mouth daily at 12 noon.      promethazine (PHENERGAN) 25 MG tablet Take 1 tablet (25 mg total) by mouth every 6 (six) hours as needed for nausea or vomiting. 30 tablet 0    promethazine-dextromethorphan (PROMETHAZINE-DM) 6.25-15 MG/5ML syrup Take 5 mLs by mouth 4 (four) times daily as needed for cough. 100 mL 0    Sertraline HCl (ZOLOFT PO) Take 100 mg by mouth at bedtime.      terconazole (TERAZOL 7) 0.4 % vaginal cream Place 1 applicator vaginally at bedtime. Use for seven days (Patient not taking: Reported on 07/03/2021) 45 g 0     Review of Systems  Gastrointestinal:  Positive for abdominal pain.  Genitourinary:  Positive for vaginal bleeding.  All other systems reviewed and are negative. Physical Exam   Blood pressure 120/70, pulse 79, temperature 98.7 F (37.1 C), temperature source Oral, resp. rate 17, height '5\' 3"'$  (1.6 m), weight 112.4 kg, last menstrual period 05/14/2021, SpO2 100 %, not currently breastfeeding.  Physical Exam Vitals and nursing note reviewed. Exam conducted with a chaperone present.  Constitutional:      Appearance: She is well-developed.  Cardiovascular:     Rate and Rhythm: Normal rate.  Pulmonary:     Effort: Pulmonary effort is normal.     Breath sounds: Normal breath sounds.  Abdominal:     Palpations: Abdomen is soft.     Tenderness: There is no abdominal tenderness.  Genitourinary:    Comments: Pelvic exam: External genitalia normal, vaginal walls pink and well rugated, cervix visually closed, no lesions noted. Moderate amount of dark discharge noted near cervical os. No  active bleeding observed   Skin:    Capillary Refill: Capillary refill takes less than 2 seconds.  Neurological:     Mental Status: She is alert and oriented to person, place, and time.  Psychiatric:        Mood and Affect: Mood normal.        Behavior: Behavior normal.    MAU Course  Procedures  --IUP previously confirmed 07/02/2021 --Live IUP visualized on BSUS.   Orders Placed This Encounter  Procedures   Wet prep, genital   Urinalysis, Routine w reflex microscopic Urine, Clean Catch   CBC   Discharge patient    Patient Vitals for the past 24 hrs:  BP Temp Temp src Pulse Resp SpO2 Height Weight  07/07/21 1234 120/70 98.7 F (37.1 C) Oral 79 17 100 % '5\' 3"'$  (1.6 m) 112.4 kg   Results for orders placed or performed during the hospital encounter of 07/07/21 (from the past 24 hour(s))  Urinalysis, Routine w reflex microscopic Urine, Clean Catch     Status: Abnormal   Collection Time: 07/07/21 12:42 PM  Result Value Ref Range   Color, Urine AMBER (A) YELLOW   APPearance HAZY (A) CLEAR   Specific Gravity, Urine 1.029 1.005 - 1.030   pH 5.0 5.0 - 8.0   Glucose, UA NEGATIVE NEGATIVE mg/dL   Hgb urine dipstick SMALL (A) NEGATIVE   Bilirubin Urine NEGATIVE NEGATIVE   Ketones, ur NEGATIVE NEGATIVE mg/dL   Protein, ur 30 (A) NEGATIVE mg/dL   Nitrite NEGATIVE NEGATIVE   Leukocytes,Ua NEGATIVE NEGATIVE   RBC / HPF 0-5 0 - 5 RBC/hpf   WBC, UA 0-5 0 - 5 WBC/hpf   Bacteria, UA NONE SEEN NONE SEEN   Squamous Epithelial / LPF 0-5 0 - 5   Mucus PRESENT   CBC     Status: None   Collection Time: 07/07/21  1:05 PM  Result Value Ref Range   WBC 7.8 4.0 - 10.5 K/uL   RBC 4.32 3.87 - 5.11 MIL/uL   Hemoglobin 12.5 12.0 - 15.0 g/dL   HCT 38.5 36.0 - 46.0 %   MCV 89.1 80.0 - 100.0 fL   MCH 28.9 26.0 - 34.0 pg   MCHC 32.5 30.0 - 36.0 g/dL   RDW 14.1 11.5 - 15.5 %   Platelets 187 150 - 400 K/uL   nRBC 0.0 0.0 - 0.2 %  Wet prep, genital     Status: Abnormal   Collection Time:  07/07/21  1:16 PM   Specimen: Cervix  Result Value Ref Range   Yeast Wet Prep HPF POC NONE SEEN NONE SEEN   Trich, Wet Prep NONE SEEN NONE SEEN   Clue Cells Wet Prep HPF POC NONE SEEN NONE SEEN   WBC, Wet Prep HPF POC MANY (A) NONE SEEN   Sperm NONE SEEN     Assessment and Plan  --35 y.o. [id number] at [redacted]w[redacted]d --+ Cardiac flicker on BSUS --Blood type O POS --Ad[facility] pelvic [facility] --Discharge home in stable conditition  SDarlina Rumpf CNorth [place]7/24/2022, 8:12 PM

## 2021-07-08 ENCOUNTER — Telehealth: Payer: Self-pay | Admitting: Lactation Services

## 2021-07-08 LAB — GC/CHLAMYDIA PROBE AMP (~~LOC~~) NOT AT ARMC
Chlamydia: NEGATIVE
Comment: NEGATIVE
Comment: NORMAL
Neisseria Gonorrhea: NEGATIVE

## 2021-07-08 NOTE — Telephone Encounter (Signed)
Called patient to inform her that [place] for 8/1 has been cancelled since she has already had an US showing fetal viability. Patient voiced understanding and is aware of all upcoming appointments. Patient with no questions or concerns at this time.

## 2021-07-08 NOTE — Telephone Encounter (Signed)
-----   Message from Eusebio Friendly, [place], RVT, RDMS sent at 07/08/2021 10:12 AM EDT ----- Regarding: RE: Does patient need exam 07/15/21 I'm cancelling her appointment next Monday.    Vaughan Basta, Can you call her and make sure she knows that the appointment is cancelled?    Hoyle Sauer  ----- Message ----- From: Danielle Rankin Sent: 07/05/2021   2:28 PM EDT To: Eusebio Friendly, RT, RVT, RDMS Subject: RE: Does patient need exam 07/15/21              Vaughan Basta form [facility] is cancelling. ----- Message ----- From: Eusebio Friendly, RT, RVT, RDMS Sent: 07/05/2021   2:07 PM EDT To: Luvenia Redden, PA-C Subject: RE: Does patient need exam 07/15/21              Just let me know next week if you are back.  We have time to call her.  It may take some convincing to get her not to come in!! [place]  ----- Message ----- From: Luvenia Redden, PA-C Sent: 07/04/2021   9:15 PM EDT To: Eusebio Friendly, RT, RVT, RDMS Subject: RE: Does patient need exam 07/15/21              If she is already confirmed viable then she does not need another scan. I'm out of the office and can't review the chart completely on my phone.   Almyra Free ----- Message ----- From: Eusebio Friendly, RT, RVT, RDMS Sent: 07/04/2021   6:30 PM EDT To: Luvenia Redden, PA-C Subject: Does patient need exam 07/15/21                  Irven Shelling, Looks like Ms. Sassano had an exam thru [facility] MAU on 07/02/21 and we saw a [place] at 53w6dand measured a heart rate of 123 bpm.    Does she still need to have an ultrasound on 07/15/21 since we see a viable pregnancy?  This outpatient appointment was scheduled after her 1st visit to MAU on 06/26/21.    Thank you. CHoyle Sauer

## 2021-07-15 ENCOUNTER — Ambulatory Visit: Admission: RE | Admit: 2021-07-15 | Payer: 59 | Source: Ambulatory Visit

## 2021-07-18 ENCOUNTER — Telehealth: Payer: 59

## 2021-07-18 ENCOUNTER — Encounter (HOSPITAL_BASED_OUTPATIENT_CLINIC_OR_DEPARTMENT_OTHER): Payer: Self-pay | Admitting: *Deleted

## 2021-07-18 ENCOUNTER — Other Ambulatory Visit: Payer: Self-pay

## 2021-07-18 ENCOUNTER — Emergency Department (HOSPITAL_BASED_OUTPATIENT_CLINIC_OR_DEPARTMENT_OTHER): Payer: 59

## 2021-07-18 ENCOUNTER — Emergency Department (HOSPITAL_BASED_OUTPATIENT_CLINIC_OR_DEPARTMENT_OTHER)
Admission: EM | Admit: 2021-07-18 | Discharge: 2021-07-18 | Disposition: A | Discharge status: Home / Self Care | Payer: 59 | Attending: Emergency Medicine | Admitting: Emergency Medicine

## 2021-07-18 DIAGNOSIS — M25561 Pain in right knee: Secondary | ICD-10-CM

## 2021-07-18 DIAGNOSIS — X58XXXA Exposure to other specified factors, initial encounter: Secondary | ICD-10-CM | POA: Diagnosis not present

## 2021-07-18 DIAGNOSIS — Z3A09 9 weeks gestation of pregnancy: Secondary | ICD-10-CM | POA: Diagnosis not present

## 2021-07-18 DIAGNOSIS — S83004A Unspecified dislocation of right patella, initial encounter: Secondary | ICD-10-CM

## 2021-07-18 DIAGNOSIS — S83091A Other subluxation of right patella, initial encounter: Secondary | ICD-10-CM | POA: Diagnosis not present

## 2021-07-18 DIAGNOSIS — O26891 Other specified pregnancy related conditions, first trimester: Secondary | ICD-10-CM | POA: Diagnosis present

## 2021-07-18 MED ORDER — LIDOCAINE 5 % EX PTCH: 1.0000 | MEDICATED_PATCH | CUTANEOUS | 0 refills | Status: DC

## 2021-07-18 MED ORDER — ACETAMINOPHEN 325 MG PO TABS
650.0000 mg | ORAL_TABLET | Freq: Once | ORAL | Status: AC
  Administered 2021-07-18: 650 mg via ORAL
  Filled 2021-07-18: qty 2

## 2021-07-18 NOTE — Discharge Instructions (Addendum)
Follow-up with the sports medicine doctors at your appointment next Wed  Can use the lidoderm patches to right knee  May take Tylenol. Do not take more than '4000mg'$  daily  Follow up with Ob as scheduled   Return for new or worsening symptoms

## 2021-07-18 NOTE — ED Triage Notes (Addendum)
States her right knee "popped" out of place yesterday. She has been unable to ambulate since that time. She is [redacted] weeks pregnant.

## 2021-07-18 NOTE — ED Provider Notes (Signed)
[facility] Provider Note   CSN: [id number] Arrival date & time: 07/18/21  1625     History Chief Complaint  Patient presents with   Knee Pain    [name] is a 35 y.o. female with past medical history significant for bipolar, ADHD, anxiety, currently [redacted] weeks pregnant who presents for evaluation of right knee pain.  Patient states her patella "keeps popping in and out."  She is having pain to this area.  No redness, swelling or warmth.  Pain worse when she ambulates.  She denies any tibial dislocations.  Patient was seen for something similar in April in which "x-rays were normal."  She wants to know why this keeps happening.  No known falls or injuries.  No complications with pregnancy.  No fever, chills, nausea or vomiting.  No paresthesias, weakness.  Has not take anything for symptoms.  Denies additional aggravating or relieving factors  History obtained from patient and past medical records.  No interpreter used.  HPI     Past Medical History:  Diagnosis Date   Anxiety    Asthma    inhaler PRN-hasn't used inhaler in months   Attention deficit hyperactivity disorder    Bipolar disorder [facility])    hospitalized as a teen for suicidal ideations and cutting   Depression    no complaints, doing well   Gastritis    GERD (gastroesophageal reflux disease)    History of gestational diabetes in prior pregnancy, currently pregnant 10/13/2017   History of preterm delivery after IOL for preeclampsia, currently pregnant 10/13/2017   Not a 17P candidate   Hx of varicella    Mild preeclampsia 02/09/2016   Vaginal Pap smear, abnormal     Patient Active Problem List   Diagnosis Date Noted   Supervision of high risk pregnancy, antepartum 07/03/2021   History of preterm delivery, currently pregnant 07/03/2021   History of pre-eclampsia in prior pregnancy, currently pregnant 07/03/2021   History of gestational hypertension 07/03/2021   Grand  multipara 07/03/2021   Retained products of conception after miscarriage 12/27/2020   Nausea and vomiting during pregnancy 10/04/2019   ADHD (attention deficit hyperactivity disorder), 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   Vaginal venereal warts 04/24/2015   Anxiety state 03/27/2014   OSA (obstructive sleep apnea) 09/15/2013   Asthma 05/21/2013   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   Intellectual disability 09/15/2012   Obesity (BMI 30-39.9) 08/03/2012   Bipolar affective disorder, depressed, moderate degree ([place]) 06/02/2012    Class: Acute   GERD (gastroesophageal reflux disease) 05/19/2012   Borderline behavior 04/12/2012    Class: Acute    Past Surgical History:  Procedure Laterality Date   DILATION AND EVACUATION N/A 01/08/2021   Procedure: DILATATION AND EVACUATION;  Surgeon: Chancy Milroy, MD;  Location: [facility];  Service: Gynecology;  Laterality: N/A;   EYE MUSCLE SURGERY Bilateral    07/29/12   MOUTH SURGERY     PLANTAR FASCIA SURGERY     WISDOM TOOTH EXTRACTION       OB History     Gravida  8   Para  5   Term  4   Preterm  1   AB  2   Living  5      SAB  2   IAB      Ectopic      Multiple  0   Live Births  5           Family History  Problem  Relation Age of Onset   Depression Mother    Colon cancer Mother    Colon polyps Mother    Depression Father    Lung cancer Father    Depression Brother    Liver disease Other    Kidney disease Other     Social History   Tobacco Use   Smoking status: Never   Smokeless tobacco: Never  Vaping Use   Vaping Use: Never used  Substance Use Topics   Alcohol use: No    Alcohol/week: 0.0 standard drinks   Drug use: No    Home Medications Prior to Admission medications   Medication Sig Start Date End Date Taking? Authorizing Provider  lidocaine (LIDODERM) 5 % Place 1 patch onto the skin daily. Remove & Discard patch within 12 hours or as directed by MD 07/18/21  Yes Kimberlee Shoun A,  PA-C  acetaminophen (TYLENOL) 500 MG tablet Take 500 mg by mouth every 6 (six) hours as needed.    [provider]  albuterol (VENTOLIN HFA) 108 (90 Base) MCG/ACT inhaler Inhale 1-2 puffs into the lungs every 6 (six) hours as needed for wheezing or shortness of breath. Patient not taking: Reported on 07/03/2021 05/09/21   Volney American, PA-C  ALPRAZolam Duanne Moron) 0.5 MG tablet Take 0.5 mg by mouth at bedtime as needed for anxiety.    [provider]  ARIPiprazole (ABILIFY IM) Inject into the muscle every 30 (thirty) days.    [provider]  ARIPiprazole (ABILIFY) 2 MG tablet Take 2 mg by mouth at bedtime.    [provider]  chlorpheniramine-HYDROcodone (TUSSIONEX PENNKINETIC ER) 10-8 MG/5ML SUER Take 5 mLs by mouth every 12 (twelve) hours as needed for cough. 05/13/21   Sherwood Gambler, MD  Doxylamine-Pyridoxine (DICLEGIS) 10-10 MG TBEC Take 2 tablets at bedtime. If this does not relieve your symptoms of nausea and vomiting after several days you may take one additional tablet at breakfast. If this does not relieve your symptoms after several days you may take one additional tablet at lunch.. Patient not taking: Reported on 07/03/2021 06/28/21   Clarnce Flock, MD  guaifenesin (ROBITUSSIN) 100 MG/5ML syrup Take 10 mLs (200 mg total) by mouth 3 (three) times daily as needed for cough. 05/06/21   Couture, Cortni S, PA-C  Prenatal Vit-Fe Fumarate-FA (PRENATAL MULTIVITAMIN) TABS tablet Take 1 tablet by mouth daily at 12 noon.    [provider]  promethazine (PHENERGAN) 25 MG tablet Take 1 tablet (25 mg total) by mouth every 6 (six) hours as needed for nausea or vomiting. 07/02/21   [name] Harder, CNM  promethazine-dextromethorphan (PROMETHAZINE-DM) 6.25-15 MG/5ML syrup Take 5 mLs by mouth 4 (four) times daily as needed for cough. 05/09/21   Volney American, PA-C  Sertraline HCl (ZOLOFT PO) Take 100 mg by mouth at bedtime.    [provider]  terconazole (TERAZOL 7) 0.4 % vaginal cream Place 1 applicator vaginally at bedtime. Use for seven days Patient not taking: Reported on 07/03/2021 07/02/21   [name] Harder, CNM    Allergies    Depakote [divalproex sodium], Methylphenidate derivatives, Neurontin [gabapentin], Prozac [fluoxetine hcl], and Methylphenidate hcl  Review of Systems   Review of Systems  Constitutional: Negative.   HENT: Negative.    Respiratory: Negative.    Cardiovascular: Negative.   Gastrointestinal: Negative.   Genitourinary: Negative.   Musculoskeletal:  Positive for gait problem. Negative for arthralgias, back pain, joint swelling, myalgias, neck pain and neck stiffness.  Right knee pain  Skin: Negative.   All other systems reviewed and are negative.  Physical Exam Updated Vital Signs BP 125/74 (BP Location: Left Wrist)   Pulse 99   Temp 98.6 F (37 C) (Oral)   Resp 20   Ht '5\' 3"'$  (1.6 m)   Wt 112.4 kg   LMP 05/14/2021   SpO2 99%   BMI 43.90 kg/m   Physical Exam Vitals and nursing note reviewed.  Constitutional:      General: She is not in acute distress.    Appearance: She is well-developed. She is not ill-appearing, toxic-appearing or diaphoretic.  HENT:     Head: Normocephalic and atraumatic.     Nose: Nose normal.     Mouth/Throat:     Mouth: Mucous membranes are moist.  Eyes:     Pupils: Pupils are equal, round, and reactive to light.  Cardiovascular:     Rate and Rhythm: Normal rate.     Pulses: Normal pulses.          Dorsalis pedis pulses are 2+ on the right side and 2+ on the left side.       Posterior tibial pulses are 2+ on the right side and 2+ on the left side.     Heart sounds: Normal heart sounds.  Pulmonary:     Effort: Pulmonary effort is normal. No respiratory distress.     Breath sounds: Normal breath sounds.  Abdominal:     General: Bowel sounds are normal. There is no distension.     Palpations: Abdomen is soft.  Musculoskeletal:         General: Normal range of motion.     Cervical back: Normal range of motion.       Legs:     Comments: Tenderness medial right knee.  Able to flex and extend.  Neg anterior drawer. Can straight leg raise bilaterally.  Mild tenderness at proximal tibial plateau.  Femur nontender.  Compartments soft.  Wiggles toes without difficulty.  Skin:    General: Skin is warm and dry.     Capillary Refill: Capillary refill takes less than 2 seconds.     Comments: No edema, erythema or warmth.  No fluctuance or induration  Neurological:     General: No focal deficit present.     Mental Status: She is alert.     Sensory: Sensation is intact.     Motor: Motor function is intact.     Comments: Equal strength bilaterally   Psychiatric:        Mood and Affect: Mood normal.    ED Results / Procedures / Treatments   Labs (all labs ordered are listed, but only abnormal results are displayed) Labs Reviewed - No data to display  EKG None  Radiology DG Tibia/Fibula Right  Result Date: 07/18/2021 CLINICAL DATA:  Knee pain.  Unable to straighten knee. EXAM: RIGHT KNEE - COMPLETE 4+ VIEW; RIGHT TIBIA AND FIBULA - 2 VIEW COMPARISON:  None. FINDINGS: No evidence of fracture, dislocation, or joint effusion. Partially visualized ankle grossly unremarkable. No evidence of severe arthropathy. No aggressive appearing focal bone abnormality. Soft tissues are unremarkable. Nonstandard views of the knee. IMPRESSION: No acute displaced fracture or dislocation of the right tibia and fibula and right knee. Slightly limited evaluation of the knee due to nonstandard views. Electronically Signed   By: Iven Finn M.D.   On: 07/18/2021 17:47   DG Knee Complete 4 Views Right  Result Date: 07/18/2021 CLINICAL DATA:  Knee pain.  Unable to straighten knee. EXAM: RIGHT KNEE - COMPLETE 4+ VIEW; RIGHT TIBIA AND FIBULA - 2 VIEW COMPARISON:  None. FINDINGS: No evidence of fracture, dislocation, or joint effusion. Partially  visualized ankle grossly unremarkable. No evidence of severe arthropathy. No aggressive appearing focal bone abnormality. Soft tissues are unremarkable. Nonstandard views of the knee. IMPRESSION: No acute displaced fracture or dislocation of the right tibia and fibula and right knee. Slightly limited evaluation of the knee due to nonstandard views. Electronically Signed   By: Iven Finn M.D.   On: 07/18/2021 17:47    Procedures .Splint Application  Date/Time: 07/18/2021 6:30 PM Performed by: Shelby Dubin A, PA-C Authorized by: Nettie Elm, PA-C   Consent:    Consent obtained:  Verbal   Consent given by:  Patient   Risks, benefits, and alternatives were discussed: yes     Risks discussed:  Discoloration, numbness, pain and swelling   Alternatives discussed:  No treatment, delayed treatment, alternative treatment, observation and referral Universal protocol:    Procedure explained and questions answered to patient or proxy's satisfaction: yes     Relevant documents present and verified: yes     Test results available: yes     Imaging studies available: yes     Required blood products, implants, devices, and special equipment available: yes     Site/side marked: yes     Immediately prior to procedure a time out was called: yes     Patient identity confirmed:  Verbally with patient Pre-procedure details:    Distal neurologic exam:  Normal   Distal perfusion: distal pulses strong and brisk capillary refill   Procedure details:    Location:  Knee   Knee location:  R knee   Strapping: yes     Cast type:  Short leg walking   Splint type:  Knee immobilizer   Attestation: Splint applied and adjusted personally by me   Post-procedure details:    Distal neurologic exam:  Normal   Distal perfusion: distal pulses strong and brisk capillary refill     Procedure completion:  Tolerated well, no immediate complications   Post-procedure imaging: not applicable     Medications  Ordered in ED Medications  acetaminophen (TYLENOL) tablet 650 mg (650 mg Oral Given 07/18/21 1739)   ED Course  I have reviewed the triage vital signs and the nursing notes.  Pertinent labs & imaging results that were available during my care of the patient were reviewed by me and considered in my medical decision making (see chart for details).  Here for evaluation of right knee pain.  Apparently has been having some popping on her right patella for quite some time.  Seen here in April for similar complaint.  Patient states her "kneecap popped" yesterday.  She has had persistent pain to the area.  Pain worse with ambulation.  She denies any falling injuries.  She is afebrile, nonseptic, not ill-appearing.  She is neurovascularly intact.  No obvious patellar dislocation on exam.  She can straight leg raise bilaterally.  Negative anterior drawer.  She does have some tenderness to her medial joint line, medial aspect tibial plateau.  Compartments are soft.  No tibial dislocation.   X-ray here personally reviewed and interpreted do not see any acute fracture, dislocation.  Low suspicion for occult injury.  No large effusion.  Low suspicion for septic joint, gout, hemarthrosis, occult fracture, VTE, myositis, rhabdomyolysis, ischemic limb.  Question if patient has luxating patella as cause  of her symptoms, given this is reoccurring.  She is placed in knee immobilizer, crutches. Ambulatory.  She is currently [redacted] weeks pregnant however no pregnancy complaints.  She was encouraged to follow-up with OB as scheduled.  Discussed Tylenol as needed for pain.  Can use ice, lidocaine patch to knee.  She does have follow-up appointment with sports medicine next Wednesday.  I encouraged her to keep this appointment.  She will return for new or worsening symptoms.  The patient has been appropriately medically screened and/or stabilized in the ED. I have low suspicion for any other emergent medical condition which  would require further screening, evaluation or treatment in the ED or require inpatient management.  Patient is hemodynamically stable and in no acute distress.  Patient able to ambulate in department prior to ED.  Evaluation does not show acute pathology that would require ongoing or additional emergent interventions while in the emergency department or further inpatient treatment.  I have discussed the diagnosis with the patient and answered all questions.  Pain is been managed while in the emergency department and patient has no further complaints prior to discharge.  Patient is comfortable with plan discussed in room and is stable for discharge at this time.  I have discussed strict return precautions for returning to the emergency department.  Patient was encouraged to follow-up with PCP/specialist refer to at discharge.     MDM Rules/Calculators/A&P                            Final Clinical Impression(s) / ED Diagnoses Final diagnoses:  Acute pain of right knee  Patellar luxation, right, initial encounter    Rx / DC Orders ED Discharge Orders          Ordered    lidocaine (LIDODERM) 5 %  Every 24 hours        07/18/21 1831             Marlyn Tondreau A, PA-C 07/18/21 1833    Margette Fast, MD 07/23/21 [phone]

## 2021-07-21 ENCOUNTER — Telehealth: Payer: 59 | Admitting: Physician Assistant

## 2021-07-21 DIAGNOSIS — S83001D Unspecified subluxation of right patella, subsequent encounter: Secondary | ICD-10-CM

## 2021-07-21 NOTE — Progress Notes (Signed)
Virtual Visit Consent   [name], you are scheduled for a virtual visit with a [facility] provider today.     Just as with appointments in the office, your consent must be obtained to participate.  Your consent will be active for this visit and any virtual visit you may have with one of our providers in the next 365 days.     If you have a MyChart account, a copy of this consent can be sent to you electronically.  All virtual visits are billed to your insurance company just like a traditional visit in the office.    As this is a virtual visit, video technology does not allow for your provider to perform a traditional examination.  This may limit your provider's ability to fully assess your condition.  If your provider identifies any concerns that need to be evaluated in person or the need to arrange testing (such as labs, EKG, etc.), we will make arrangements to do so.     Although advances in technology are sophisticated, we cannot ensure that it will always work on either your end or our end.  If the connection with a video visit is poor, the visit may have to be switched to a telephone visit.  With either a video or telephone visit, we are not always able to ensure that we have a secure connection.     I need to obtain your verbal consent now.   Are you willing to proceed with your visit today?    [name] has provided verbal consent on 07/21/2021 for a virtual visit (video or telephone).   [name], [place]   Date: 07/21/2021 1:46 PM   Virtual Visit via Video Note   I, [name], connected with  [name]  ([id number], 1986/05/22) on 07/21/21 at  1:30 PM EDT by a video-enabled telemedicine application and verified that I am speaking with the correct person using two identifiers.  Location: Patient: Virtual Visit Location Patient: Home Provider: Virtual Visit Location Provider: Home Office   I discussed the limitations of evaluation and  management by telemedicine and the availability of in person appointments. The patient expressed understanding and agreed to proceed.    History of Present Illness: [name] is a 35 y.o. who identifies as a female who was assigned female at birth, and is being seen today for continued subluxation of .right patella. Has had history of this off and on over the past few years but notes would always "pop" right back into place. This current episode started 3.5 days ago and was causing significant pain. As such she was evaluated at the ER where she notes a reduction was attempted but unsuccessful. AS such, she was given instructions for home care, lidocaine patches and set up with Sports medicine. She is currently [redacted] weeks pregnant and so is limited with medications. Notes that she feels she cannot wait until Wednesday to see Sports Medicine doctor due to pain and inability to get around to help her husband who is sick currently.   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     Methylphenidate Derivatives Other (See Comments)    Reaction:  Depression and anger    Neurontin [Gabapentin] Other (See Comments)    Reaction:  Dizziness    Prozac [Fluoxetine Hcl] Other (See Comments)    Reaction:  Anger    Methylphenidate Hcl     SAME AS PREVIOUS LISTED METHYLPHENIDATE   Medications:  Current Outpatient Medications:    albuterol (VENTOLIN HFA) 108 (90 Base) MCG/ACT inhaler, Inhale 1-2 puffs into the lungs every 6 (six) hours as needed for wheezing or shortness of breath. (Patient not taking: Reported on 07/03/2021), Disp: 18 g, Rfl: 0   ALPRAZolam (XANAX) 0.5 MG tablet, Take 0.5 mg by mouth at bedtime as needed for anxiety., Disp: , Rfl:    ARIPiprazole (ABILIFY IM), Inject into the muscle every 30 (thirty) days., Disp: , Rfl:    ARIPiprazole (ABILIFY) 2 MG tablet, Take 2 mg by mouth at bedtime., Disp: , Rfl:    chlorpheniramine-HYDROcodone (TUSSIONEX PENNKINETIC ER) 10-8 MG/5ML SUER, Take 5 mLs by mouth every 12 (twelve) hours as needed for cough., Disp: 50 mL, Rfl: 0   Doxylamine-Pyridoxine (DICLEGIS) 10-10 MG TBEC, Take 2 tablets at bedtime. If this does not relieve your symptoms of nausea and vomiting after several days you may take one additional tablet at breakfast. If this does not relieve your symptoms after several days you may take one additional tablet at lunch.. (Patient not taking: Reported on 07/03/2021), Disp: 120 tablet, Rfl: 2   guaifenesin (ROBITUSSIN) 100 MG/5ML syrup, Take 10 mLs (200 mg total) by mouth 3 (three) times daily as needed for cough., Disp: 120 mL, Rfl: 0   lidocaine (LIDODERM) 5 %, Place 1 patch onto the skin daily. Remove & Discard patch within 12 hours or as directed by MD, Disp: 30 patch, Rfl: 0   Prenatal Vit-Fe Fumarate-FA (PRENATAL MULTIVITAMIN) TABS tablet, Take 1 tablet by  mouth daily at 12 noon., Disp: , Rfl:    promethazine (PHENERGAN) 25 MG tablet, Take 1 tablet (25 mg total) by mouth every 6 (six) hours as needed for nausea or vomiting., Disp: 30 tablet, Rfl: 0   promethazine-dextromethorphan (PROMETHAZINE-DM) 6.25-15 MG/5ML syrup, Take 5 mLs by mouth 4 (four) times daily as needed for cough., Disp: 100 mL, Rfl: 0   Sertraline HCl (ZOLOFT PO), Take 100 mg by mouth at bedtime., Disp: , Rfl:   Observations/Objective: Patient is well-developed, well-nourished in no acute distress.  Resting comfortably at home.  Head is normocephalic, atraumatic.  No labored breathing. Speech is clear and coherent with logical content.  Patient is alert and oriented at baseline.   Assessment and Plan: 1. Patellar subluxation, right, subsequent encounter Discussed with her that she will need in-person evaluation. Recommended [facility] after hours but she notes she will not be able to go there before they close today. Recommend  she go to local Urgent Care/ER facility for evaluation giving severity of pain.  Follow Up Instructions: I discussed the assessment and treatment plan with the patient. The patient was provided an opportunity to ask questions and all were answered. The patient agreed with the plan and demonstrated an understanding of the instructions.  A copy of instructions were sent to the patient via MyChart.  The patient was advised to call back or seek an in-person evaluation if the symptoms worsen or if the condition fails to improve as anticipated.  Time:  I spent 13 minutes with the patient via telehealth technology discussing the above problems/concerns.    Nichole Rio, PA-C

## 2021-07-21 NOTE — Patient Instructions (Signed)
[name], thank you for joining [name], PA-C for today's virtual visit.  While this provider is not your primary care provider (PCP), if your PCP is located in our provider database this encounter information will be shared with them immediately following your visit.  Consent: (Patient) [name] provided verbal consent for this virtual visit at the beginning of the encounter.  Current Medications:  Current Outpatient Medications:    albuterol (VENTOLIN HFA) 108 (90 Base) MCG/ACT inhaler, Inhale 1-2 puffs into the lungs every 6 (six) hours as needed for wheezing or shortness of breath. (Patient not taking: Reported on 07/03/2021), Disp: 18 g, Rfl: 0   ALPRAZolam (XANAX) 0.5 MG tablet, Take 0.5 mg by mouth at bedtime as needed for anxiety., Disp: , Rfl:    ARIPiprazole (ABILIFY IM), Inject into the muscle every 30 (thirty) days., Disp: , Rfl:    ARIPiprazole (ABILIFY) 2 MG tablet, Take 2 mg by mouth at bedtime., Disp: , Rfl:    chlorpheniramine-HYDROcodone (TUSSIONEX PENNKINETIC ER) 10-8 MG/5ML SUER, Take 5 mLs by mouth every 12 (twelve) hours as needed for cough., Disp: 50 mL, Rfl: 0   Doxylamine-Pyridoxine (DICLEGIS) 10-10 MG TBEC, Take 2 tablets at bedtime. If this does not relieve your symptoms of nausea and vomiting after several days you may take one additional tablet at breakfast. If this does not relieve your symptoms after several days you may take one additional tablet at lunch.. (Patient not taking: Reported on 07/03/2021), Disp: 120 tablet, Rfl: 2   guaifenesin (ROBITUSSIN) 100 MG/5ML syrup, Take 10 mLs (200 mg total) by mouth 3 (three) times daily as needed for cough., Disp: 120 mL, Rfl: 0   lidocaine (LIDODERM) 5 %, Place 1 patch onto the skin daily. Remove & Discard patch within 12 hours or as directed by MD, Disp: 30 patch, Rfl: 0   Prenatal Vit-Fe Fumarate-FA (PRENATAL MULTIVITAMIN) TABS tablet, Take 1 tablet by mouth daily at 12 noon., Disp: , Rfl:     promethazine (PHENERGAN) 25 MG tablet, Take 1 tablet (25 mg total) by mouth every 6 (six) hours as needed for nausea or vomiting., Disp: 30 tablet, Rfl: 0   promethazine-dextromethorphan (PROMETHAZINE-DM) 6.25-15 MG/5ML syrup, Take 5 mLs by mouth 4 (four) times daily as needed for cough., Disp: 100 mL, Rfl: 0   Sertraline HCl (ZOLOFT PO), Take 100 mg by mouth at bedtime., Disp: , Rfl:    Medications ordered in this encounter:  No orders of the defined types were placed in this encounter.   *If you need refills on other medications prior to your next appointment, please contact your pharmacy*  Follow-Up: Call back or seek an in-person evaluation if the symptoms worsen or if the condition fails to improve as anticipated.  Other Instructions Please look into [facility] at [phone]. Otherwise use the link below for our other Urgent Care facilities. Again if anything worsens, please be seen at closest ER.    If you have been instructed to have an in-person evaluation today at a local Urgent Care facility, please use the link below. It will take you to a list of all of our available [facility], including address, phone number and hours of operation. Please do not delay care.  [facility]  If you or a family member do not have a primary care provider, use the link below to schedule a visit and establish care. When you choose a  primary care physician or advanced  practice provider, you gain a long-term partner in health. Find a Primary Care Provider  Learn more about [facility]'s in-office and virtual care options: [facility]

## 2021-07-22 ENCOUNTER — Other Ambulatory Visit: Payer: Self-pay

## 2021-07-22 ENCOUNTER — Ambulatory Visit (INDEPENDENT_AMBULATORY_CARE_PROVIDER_SITE_OTHER): Payer: 59 | Admitting: Family Medicine

## 2021-07-22 ENCOUNTER — Encounter: Payer: Self-pay | Admitting: Family Medicine

## 2021-07-22 VITALS — BP 120/64 | Ht 63.0 in | Wt 247.8 lb

## 2021-07-22 DIAGNOSIS — M25561 Pain in right knee: Secondary | ICD-10-CM

## 2021-07-22 MED ORDER — HYDROCODONE-ACETAMINOPHEN 5-325 MG PO TABS: 1.0000 | ORAL_TABLET | Freq: Four times a day (QID) | ORAL | 0 refills | Status: DC | PRN

## 2021-07-22 NOTE — Progress Notes (Signed)
PCP: Patient, No Pcp Per (Inactive)  Subjective:   HPI: Patient is a 35 y.o. female here for right knee pain.  Patient reports she's had issues for the past 2 years of her right patella dislocating. Current issue started about 1 week ago and still doesn't feel like the patella is in place. Difficulty walking as a result of pain. Cannot straighten leg out completely. Tried norco with some relief. Went to ED - radiographs were negative for fracture and not dislocated.  Past Medical History:  Diagnosis Date   Anxiety    Asthma    inhaler PRN-hasn't used inhaler in months   Attention deficit hyperactivity disorder    Bipolar disorder [facility])    hospitalized as a teen for suicidal ideations and cutting   Depression    no complaints, doing well   Gastritis    GERD (gastroesophageal reflux disease)    History of gestational diabetes in prior pregnancy, currently pregnant 10/13/2017   History of preterm delivery after IOL for preeclampsia, currently pregnant 10/13/2017   Not a 17P candidate   Hx of varicella    Mild preeclampsia 02/09/2016   Vaginal Pap smear, abnormal     Current Outpatient Medications on File Prior to Visit  Medication Sig Dispense Refill   albuterol (VENTOLIN HFA) 108 (90 Base) MCG/ACT inhaler Inhale 1-2 puffs into the lungs every 6 (six) hours as needed for wheezing or shortness of breath. (Patient not taking: Reported on 07/03/2021) 18 g 0   ALPRAZolam (XANAX) 0.5 MG tablet Take 0.5 mg by mouth at bedtime as needed for anxiety.     ARIPiprazole (ABILIFY IM) Inject into the muscle every 30 (thirty) days.     ARIPiprazole (ABILIFY) 2 MG tablet Take 2 mg by mouth at bedtime.     chlorpheniramine-HYDROcodone (TUSSIONEX PENNKINETIC ER) 10-8 MG/5ML SUER Take 5 mLs by mouth every 12 (twelve) hours as needed for cough. 50 mL 0   Doxylamine-Pyridoxine (DICLEGIS) 10-10 MG TBEC Take 2 tablets at bedtime. If this does not relieve your symptoms of nausea and vomiting after  several days you may take one additional tablet at breakfast. If this does not relieve your symptoms after several days you may take one additional tablet at lunch.. (Patient not taking: Reported on 07/03/2021) 120 tablet 2   guaifenesin (ROBITUSSIN) 100 MG/5ML syrup Take 10 mLs (200 mg total) by mouth 3 (three) times daily as needed for cough. 120 mL 0   lidocaine (LIDODERM) 5 % Place 1 patch onto the skin daily. Remove & Discard patch within 12 hours or as directed by MD 30 patch 0   Prenatal Vit-Fe Fumarate-FA (PRENATAL MULTIVITAMIN) TABS tablet Take 1 tablet by mouth daily at 12 noon.     promethazine (PHENERGAN) 25 MG tablet Take 1 tablet (25 mg total) by mouth every 6 (six) hours as needed for nausea or vomiting. 30 tablet 0   Sertraline HCl (ZOLOFT PO) Take 100 mg by mouth at bedtime.     No current facility-administered medications on file prior to visit.    Past Surgical History:  Procedure Laterality Date   DILATION AND EVACUATION N/A 01/08/2021   Procedure: DILATATION AND EVACUATION;  Surgeon: Chancy Milroy, MD;  Location: [facility];  Service: Gynecology;  Laterality: N/A;   EYE MUSCLE SURGERY Bilateral    07/29/12   MOUTH SURGERY     PLANTAR FASCIA SURGERY     WISDOM TOOTH EXTRACTION      Allergies  Allergen Reactions  Depakote [Divalproex Sodium] Other (See Comments)    Pt states that this medication makes her blood levels toxic.     Methylphenidate Derivatives Other (See Comments)    Reaction:  Depression and anger    Neurontin [Gabapentin] Other (See Comments)    Reaction:  Dizziness    Prozac [Fluoxetine Hcl] Other (See Comments)    Reaction:  Anger    Methylphenidate Hcl     SAME AS PREVIOUS LISTED METHYLPHENIDATE    Social History   Socioeconomic History   Marital status: Married    Spouse name: Not on file   Number of children: 0   Years of education: Not on file   Highest education level: Not on file  Occupational History   Not on file   Tobacco Use   Smoking status: Never   Smokeless tobacco: Never  Vaping Use   Vaping Use: Never used  Substance and Sexual Activity   Alcohol use: No    Alcohol/week: 0.0 standard drinks   Drug use: No   Sexual activity: Yes    Birth control/protection: None  Other Topics Concern   Not on file  Social History Narrative   Not on file   Social Determinants of Health   Financial Resource Strain: Not on file  Food Insecurity: No Food Insecurity   Worried About Running Out of Food in the Last Year: Never true   Ran Out of Food in the Last Year: Never true  Transportation Needs: No Transportation Needs   Lack of Transportation (Medical): No   Lack of Transportation (Non-Medical): No  Physical Activity: Not on file  Stress: Not on file  Social Connections: Not on file  Intimate Partner Violence: Not on file    Family History  Problem Relation Age of Onset   Depression Mother    Colon cancer Mother    Colon polyps Mother    Depression Father    Lung cancer Father    Depression Brother    Liver disease Other    Kidney disease Other     BP 120/64   Ht '5\' 3"'$  (1.6 m)   Wt 247 lb 12.8 oz (112.4 kg)   LMP 05/14/2021   BMI 43.90 kg/m   No flowsheet data found.  No flowsheet data found.  Review of Systems: See HPI above.     Objective:  Physical Exam:  Gen: NAD, comfortable in exam room, exam partially limited by guarding. Ambulates with limp in the hallway with knee slightly flexed.  Right knee: No gross deformity, ecchymoses, effusion. TTP Patellar facets, medial joint line. Able to fully passively extend.  Active motion limited from 20-90 degrees. Negative ant/post drawers. Negative valgus/varus testing. Negative lachman. Negative mcmurrays, apleys. Pain with apprehension. NV intact distally.   Limited MSK u/s:  Patella located in femoral groove.  Mild effusion.  Patellar and quad tendons intact.  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  Current episode not a true dislocation.  However, given recurrent persistent instability recommended moving forward with MRI to assess MPFL, retinaculum.  Brace or sleeve for support, crutches if needed.  Norco as needed for severe pain.  Initially recommended aleve or ibuprofen as well but patient is pregnant - advised against taking these.

## 2021-07-22 NOTE — Patient Instructions (Addendum)
You are describing recurrent subluxations/dislocations of your patella with instability. Norco as needed for severe pain - we do not refill this medication though. Knee brace can help with support (or a sleeve). Crutches to help with getting around. We will go ahead with an MRI of this knee to assess the ligaments that hold the kneecap in place - we will call you with results and next steps after this.

## 2021-07-24 ENCOUNTER — Ambulatory Visit: Payer: 59 | Admitting: Family Medicine

## 2021-07-24 ENCOUNTER — Telehealth: Payer: 59 | Admitting: Physician Assistant

## 2021-07-24 DIAGNOSIS — O219 Vomiting of pregnancy, unspecified: Secondary | ICD-10-CM

## 2021-07-24 NOTE — Progress Notes (Signed)
Virtual Visit Consent   [name], you are scheduled for a virtual visit with a [facility] provider today.     Just as with appointments in the office, your consent must be obtained to participate.  Your consent will be active for this visit and any virtual visit you may have with one of our providers in the next 365 days.     If you have a MyChart account, a copy of this consent can be sent to you electronically.  All virtual visits are billed to your insurance company just like a traditional visit in the office.    As this is a virtual visit, video technology does not allow for your provider to perform a traditional examination.  This may limit your provider's ability to fully assess your condition.  If your provider identifies any concerns that need to be evaluated in person or the need to arrange testing (such as labs, EKG, etc.), we will make arrangements to do so.     Although advances in technology are sophisticated, we cannot ensure that it will always work on either your end or our end.  If the connection with a video visit is poor, the visit may have to be switched to a telephone visit.  With either a video or telephone visit, we are not always able to ensure that we have a secure connection.     I need to obtain your verbal consent now.   Are you willing to proceed with your visit today?    [name] has provided verbal consent on 07/24/2021 for a virtual visit (video or telephone).   Mar Daring, PA-C   Date: 07/24/2021 4:48 PM   Virtual Visit via Video Note   IMar Daring, connected with  [name]  ([id number], [birth year]) on 07/24/21 at  3:15 PM EDT by a video-enabled telemedicine application and verified that I am speaking with the correct person using two identifiers.  Location: Patient: Virtual Visit Location Patient: Home Provider: Virtual Visit Location Provider: Home Office   I discussed the limitations of evaluation and  management by telemedicine and the availability of in person appointments. The patient expressed understanding and agreed to proceed.    History of Present Illness: [name] is a 35 y.o. who identifies as a female who was assigned female at birth, and is being seen today for nausea. She is [redacted] weeks pregnant.   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  Methylphenidate Derivatives Other (See Comments)    Reaction:  Depression and anger    Neurontin [Gabapentin] Other (See Comments)    Reaction:  Dizziness    Prozac [Fluoxetine Hcl] Other (See Comments)    Reaction:  Anger    Methylphenidate Hcl     SAME AS PREVIOUS LISTED METHYLPHENIDATE   Medications:  Current Outpatient Medications:    albuterol (VENTOLIN HFA) 108 (90 Base) MCG/ACT inhaler, Inhale 1-2 puffs into the lungs every 6 (six) hours as needed for  wheezing or shortness of breath. (Patient not taking: Reported on 07/03/2021), Disp: 18 g, Rfl: 0   ALPRAZolam (XANAX) 0.5 MG tablet, Take 0.5 mg by mouth at bedtime as needed for anxiety., Disp: , Rfl:    ARIPiprazole (ABILIFY IM), Inject into the muscle every 30 (thirty) days., Disp: , Rfl:    ARIPiprazole (ABILIFY) 2 MG tablet, Take 2 mg by mouth at bedtime., Disp: , Rfl:    chlorpheniramine-HYDROcodone (TUSSIONEX PENNKINETIC ER) 10-8 MG/5ML SUER, Take 5 mLs by mouth every 12 (twelve) hours as needed for cough., Disp: 50 mL, Rfl: 0   Doxylamine-Pyridoxine (DICLEGIS) 10-10 MG TBEC, Take 2 tablets at bedtime. If this does not relieve your symptoms of nausea and vomiting after several days you may take one additional tablet at breakfast. If this does not relieve your symptoms after several days you may take one additional tablet at lunch.. (Patient not taking: Reported on 07/03/2021), Disp: 120 tablet, Rfl: 2   guaifenesin (ROBITUSSIN) 100 MG/5ML syrup, Take 10 mLs (200 mg total) by mouth 3 (three) times daily as needed for cough., Disp: 120 mL, Rfl: 0   lidocaine (LIDODERM) 5 %, Place 1 patch onto the skin daily. Remove & Discard patch within 12 hours or as directed by MD, Disp: 30 patch, Rfl: 0   Prenatal Vit-Fe Fumarate-FA (PRENATAL MULTIVITAMIN) TABS tablet, Take 1 tablet by mouth daily at 12 noon., Disp: , Rfl:    promethazine (PHENERGAN) 25 MG tablet, Take 1 tablet (25 mg total) by mouth every 6 (six) hours as needed for nausea or vomiting., Disp: 30 tablet, Rfl: 0   Sertraline HCl (ZOLOFT PO), Take 100 mg by mouth at bedtime., Disp: , Rfl:   Observations/Objective: Patient is well-developed, well-nourished in no acute distress.  Resting comfortably at home.  Head is normocephalic, atraumatic.  No labored breathing. Speech is clear and coherent with logical content.  Patient is alert and oriented at baseline.    Assessment and Plan: 1. Nausea and vomiting during pregnancy  - Discussed  that we, unfortunately, do not treat women that are pregnant and recommend they be seen in person for safety of them and the unborn child. Advised to call OB/GYN or be seen at local UC. She voiced understanding  Follow Up Instructions: I discussed the assessment and treatment plan with the patient. The patient was provided an opportunity to ask questions and all were answered. The patient agreed with the plan and demonstrated an understanding of the instructions.  A copy of instructions were sent to the patient via MyChart.  The patient was advised to call back or seek an in-person evaluation if the symptoms worsen or if the condition fails to improve as anticipated.  Time:  I spent 11 minutes with the patient via telehealth technology discussing the above problems/concerns.    Mar Daring, PA-C

## 2021-07-24 NOTE — Patient Instructions (Addendum)
[name], thank you for joining Mar Daring, PA-C for today's virtual visit.  While this provider is not your primary care provider (PCP), if your PCP is located in our provider database this encounter information will be shared with them immediately following your visit.  Consent: (Patient) [name] provided verbal consent for this virtual visit at the beginning of the encounter.  Current Medications:  Current Outpatient Medications:    albuterol (VENTOLIN HFA) 108 (90 Base) MCG/ACT inhaler, Inhale 1-2 puffs into the lungs every 6 (six) hours as needed for wheezing or shortness of breath. (Patient not taking: Reported on 07/03/2021), Disp: 18 g, Rfl: 0   ALPRAZolam (XANAX) 0.5 MG tablet, Take 0.5 mg by mouth at bedtime as needed for anxiety., Disp: , Rfl:    ARIPiprazole (ABILIFY IM), Inject into the muscle every 30 (thirty) days., Disp: , Rfl:    ARIPiprazole (ABILIFY) 2 MG tablet, Take 2 mg by mouth at bedtime., Disp: , Rfl:    chlorpheniramine-HYDROcodone (TUSSIONEX PENNKINETIC ER) 10-8 MG/5ML SUER, Take 5 mLs by mouth every 12 (twelve) hours as needed for cough., Disp: 50 mL, Rfl: 0   Doxylamine-Pyridoxine (DICLEGIS) 10-10 MG TBEC, Take 2 tablets at bedtime. If this does not relieve your symptoms of nausea and vomiting after several days you may take one additional tablet at breakfast. If this does not relieve your symptoms after several days you may take one additional tablet at lunch.. (Patient not taking: Reported on 07/03/2021), Disp: 120 tablet, Rfl: 2   guaifenesin (ROBITUSSIN) 100 MG/5ML syrup, Take 10 mLs (200 mg total) by mouth 3 (three) times daily as needed for cough., Disp: 120 mL, Rfl: 0   lidocaine (LIDODERM) 5 %, Place 1 patch onto the skin daily. Remove & Discard patch within 12 hours or as directed by MD, Disp: 30 patch, Rfl: 0   Prenatal Vit-Fe Fumarate-FA (PRENATAL MULTIVITAMIN) TABS tablet, Take 1 tablet by mouth daily at 12 noon., Disp: , Rfl:     promethazine (PHENERGAN) 25 MG tablet, Take 1 tablet (25 mg total) by mouth every 6 (six) hours as needed for nausea or vomiting., Disp: 30 tablet, Rfl: 0   Sertraline HCl (ZOLOFT PO), Take 100 mg by mouth at bedtime., Disp: , Rfl:    Medications ordered in this encounter:  No orders of the defined types were placed in this encounter.    *If you need refills on other medications prior to your next appointment, please contact your pharmacy*  Follow-Up: Call back or seek an in-person evaluation if the symptoms worsen or if the condition fails to improve as anticipated.  Other Instructions Please call OB/GYN or be seen in person at Urgent Care   If you have been instructed to have an in-person evaluation today at a local Urgent Care facility, please use the link below. It will take you to a list of all of our available [facility], including address, phone number and hours of operation. Please do not delay care.  [facility]  If you or a family member do not have a primary care provider, use the link below to schedule a visit and establish care. When you choose a [facility] primary care physician or advanced practice provider, you gain a long-term partner in health. Find a Primary Care Provider  Learn more about [facility]'s in-office and virtual care options: [facility]  Morning Sickness  Morning sickness is when you feel like you may  vomit (feel nauseous) during pregnancy. Sometimes, you may vomit. Morning sickness most often happens in the morning, but it can also happen at any time of the day. Some women may have morning sickness that makes them vomit all the time. This is amore serious problem that needs treatment. What are the causes? The cause of this condition is not known. What increases the risk? You had vomiting or a feeling like you may vomit before your pregnancy. You had morning sickness in another pregnancy. You are pregnant  with more than one baby, such as twins. What are the signs or symptoms? Feeling like you may vomit. Vomiting. How is this treated? Treatment is usually not needed for this condition. You may only need to change what you eat. In some cases, your doctor may give you some things to take for your condition. These include: Vitamin B6 supplements. Medicines to treat the feeling that you may vomit. Ginger. Follow these instructions at home: Medicines Take over-the-counter and prescription medicines only as told by your doctor. Do not take any medicines until you talk with your doctor about them first. Take multivitamins before you get pregnant. These can stop or lessen the symptoms of morning sickness. Eating and drinking Eat dry toast or crackers before getting out of bed. Eat 5 or 6 small meals a day. Eat dry and bland foods like rice and baked potatoes. Do not eat greasy, fatty, or spicy foods. Have someone cook for you if the smell of food causes you to vomit or to feel like you may vomit. If you feel like you may vomit after taking prenatal vitamins, take them at night or with a snack. Eat protein foods when you need a snack. Nuts, yogurt, and cheese are good choices. Drink fluids throughout the day. Try ginger ale made with real ginger, ginger tea made from fresh grated ginger, or ginger candies. General instructions Do not smoke or use any products that contain nicotine or tobacco. If you need help quitting, ask your doctor. Use an air purifier to keep the air in your house free of smells. Get lots of fresh air. Try to avoid smells that make you feel sick. Try wearing an acupressure wristband. This is a wristband that is used to treat seasickness. Try a treatment called acupuncture. In this treatment, a doctor puts needles into certain areas of your body to make you feel better. Contact a doctor if: You need medicine to feel better. You feel dizzy or light-headed. You are losing  weight. Get help right away if: The feeling that you may vomit will not go away, or you cannot stop vomiting. You faint. You have very bad pain in your belly. Summary Morning sickness is when you feel like you may vomit (feel nauseous) during pregnancy. You may feel sick in the morning, but you can feel this way at any time of the day. Making some changes to what you eat may help your symptoms go away. This information is not intended to replace advice given to you by your health care provider. Make sure you discuss any questions you have with your healthcare provider. Document Revised: 07/16/2020 Document Reviewed: 06/25/2020 Elsevier Patient Education  2022 [employer].

## 2021-07-25 ENCOUNTER — Encounter: Payer: 59 | Admitting: Medical

## 2021-07-28 ENCOUNTER — Other Ambulatory Visit: Payer: 59

## 2021-08-03 ENCOUNTER — Other Ambulatory Visit: Payer: Self-pay

## 2021-08-03 ENCOUNTER — Ambulatory Visit
Admission: RE | Admit: 2021-08-03 | Discharge: 2021-08-03 | Disposition: A | Discharge status: Home / Self Care | Payer: 59 | Source: Ambulatory Visit | Attending: Family Medicine | Admitting: Family Medicine

## 2021-08-03 DIAGNOSIS — M25561 Pain in right knee: Secondary | ICD-10-CM

## 2021-08-05 ENCOUNTER — Ambulatory Visit (INDEPENDENT_AMBULATORY_CARE_PROVIDER_SITE_OTHER): Payer: 59 | Admitting: Obstetrics and Gynecology

## 2021-08-05 ENCOUNTER — Other Ambulatory Visit: Payer: Self-pay

## 2021-08-05 VITALS — BP 128/92 | HR 96 | Wt 240.7 lb

## 2021-08-05 DIAGNOSIS — O219 Vomiting of pregnancy, unspecified: Secondary | ICD-10-CM | POA: Diagnosis not present

## 2021-08-05 DIAGNOSIS — O09899 Supervision of other high risk pregnancies, unspecified trimester: Secondary | ICD-10-CM

## 2021-08-05 DIAGNOSIS — Z3A11 11 weeks gestation of pregnancy: Secondary | ICD-10-CM | POA: Diagnosis not present

## 2021-08-05 DIAGNOSIS — Z641 Problems related to multiparity: Secondary | ICD-10-CM

## 2021-08-05 DIAGNOSIS — O0990 Supervision of high risk pregnancy, unspecified, unspecified trimester: Secondary | ICD-10-CM

## 2021-08-05 DIAGNOSIS — Z8759 Personal history of other complications of pregnancy, childbirth and the puerperium: Secondary | ICD-10-CM

## 2021-08-05 DIAGNOSIS — O09299 Supervision of pregnancy with other poor reproductive or obstetric history, unspecified trimester: Secondary | ICD-10-CM

## 2021-08-05 MED ORDER — ONDANSETRON 4 MG PO TBDP: 4.0000 mg | ORAL_TABLET | Freq: Four times a day (QID) | ORAL | 1 refills | Status: DC | PRN

## 2021-08-05 NOTE — Progress Notes (Signed)
INITIAL PRENATAL VISIT NOTE  Subjective:  [name] is a 35 y.o. [id number] at 54w6dby LMP being seen today for her initial prenatal visit. She has an obstetric history significant for preeclampsia and gestational hypertension. She has a medical history significant for sleep apnea and several behavorial disorders.  Patient reports nausea.   . Vag. Bleeding: None.  Movement: Absent. Denies leaking of fluid.    Past Medical History:  Diagnosis Date   Anxiety    Asthma    inhaler PRN-hasn't used inhaler in months   Attention deficit hyperactivity disorder    Bipolar disorder ([facility]    hospitalized as a teen for suicidal ideations and cutting   Depression    no complaints, doing well   Gastritis    GERD (gastroesophageal reflux disease)    History of gestational diabetes in prior pregnancy, currently pregnant 10/13/2017   History of preterm delivery after IOL for preeclampsia, currently pregnant 10/13/2017   Not a 17P candidate   Hx of varicella    Mild preeclampsia 02/09/2016   Vaginal Pap smear, abnormal     Past Surgical History:  Procedure Laterality Date   DILATION AND EVACUATION N/A 01/08/2021   Procedure: DILATATION AND EVACUATION;  Surgeon: EChancy Milroy MD;  [facility]  Service: Gynecology;  Laterality: N/A;   EYE MUSCLE SURGERY Bilateral    07/29/12   MOUTH SURGERY     PLANTAR FASCIA SURGERY     WISDOM TOOTH EXTRACTION      OB History  Gravida Para Term Preterm AB Living  '8 5 4 1 2 5  '$ SAB IAB Ectopic Multiple Live Births  2     0 5    # Outcome Date GA Lbr Len/2nd Weight Sex Delivery Anes PTL Lv  8 Current           7 SAB 12/2020     SAB     6 Term 03/16/20 334w0d 00:14 7 lb 2.1 oz (3.235 kg) F Vag-Spont EPI  LIV  5 SAB 05/2019          4 Term 12/17/18 3745w0d lb (2.722 kg) F Vag-Spont   LIV     Birth Comments: Hypertension  3 Term 02/19/18 37w58w2d:02 / 01:46 8 lb 5.7 oz (3.79 kg) M Vag-Spont EPI  LIV     Birth  Comments: hypertension  2 Preterm 02/03/17 35w623w6db 7.9 oz (3.4 kg) M Vag-Spont EPI  LIV     Birth Comments: hypertension  1 Term 02/11/16 69w1d74w1d14 8 lb 2.6 oz (3.702 kg) F Vag-Spont EPI  LIV     Birth Comments: preeclampsia    Social History   Socioeconomic History   Marital status: Married    Spouse name: Not on file   Number of children: 0   Years of education: Not on file   Highest education level: Not on file  Occupational History   Not on file  Tobacco Use   Smoking status: Never   Smokeless tobacco: Never  Vaping Use   Vaping Use: Never used  Substance and Sexual Activity   Alcohol use: No    Alcohol/week: 0.0 standard drinks   Drug use: No   Sexual activity: Yes    Birth control/protection: None  Other Topics Concern   Not on file  Social History Narrative   Not on file   Social Determinants of Health   Financial Resource Strain: Not on file  Food Insecurity: No Food Insecurity   Worried About Charity fundraiser in the Last Year: Never true   Ran Out of Food in the Last Year: Never true  Transportation Needs: No Transportation Needs   Lack of Transportation (Medical): No   Lack of Transportation (Non-Medical): No  Physical Activity: Not on file  Stress: Not on file  Social Connections: Not on file    Family History  Problem Relation Age of Onset   Depression Mother    Colon cancer Mother    Colon polyps Mother    Depression Father    Lung cancer Father    Depression Brother    Liver disease Other    Kidney disease Other      Current Outpatient Medications:    ARIPiprazole (ABILIFY IM), Inject into the muscle every 30 (thirty) days., Disp: , Rfl:    chlorpheniramine-HYDROcodone (TUSSIONEX PENNKINETIC ER) 10-8 MG/5ML SUER, Take 5 mLs by mouth every 12 (twelve) hours as needed for cough., Disp: 50 mL, Rfl: 0   Prenatal Vit-Fe Fumarate-FA (PRENATAL MULTIVITAMIN) TABS tablet, Take 1 tablet by mouth daily at 12 noon., Disp: , Rfl:     albuterol (VENTOLIN HFA) 108 (90 Base) MCG/ACT inhaler, Inhale 1-2 puffs into the lungs every 6 (six) hours as needed for wheezing or shortness of breath. (Patient not taking: No sig reported), Disp: 18 g, Rfl: 0   ALPRAZolam (XANAX) 0.5 MG tablet, Take 0.5 mg by mouth at bedtime as needed for anxiety. (Patient not taking: Reported on 08/05/2021), Disp: , Rfl:    ARIPiprazole (ABILIFY) 2 MG tablet, Take 2 mg by mouth at bedtime. (Patient not taking: Reported on 08/05/2021), Disp: , Rfl:    Doxylamine-Pyridoxine (DICLEGIS) 10-10 MG TBEC, Take 2 tablets at bedtime. If this does not relieve your symptoms of nausea and vomiting after several days you may take one additional tablet at breakfast. If this does not relieve your symptoms after several days you may take one additional tablet at lunch.. (Patient not taking: No sig reported), Disp: 120 tablet, Rfl: 2   guaifenesin (ROBITUSSIN) 100 MG/5ML syrup, Take 10 mLs (200 mg total) by mouth 3 (three) times daily as needed for cough., Disp: 120 mL, Rfl: 0   lidocaine (LIDODERM) 5 %, Place 1 patch onto the skin daily. Remove & Discard patch within 12 hours or as directed by MD (Patient not taking: Reported on 08/05/2021), Disp: 30 patch, Rfl: 0   promethazine (PHENERGAN) 25 MG tablet, Take 1 tablet (25 mg total) by mouth every 6 (six) hours as needed for nausea or vomiting. (Patient not taking: Reported on 08/05/2021), Disp: 30 tablet, Rfl: 0   Sertraline HCl (ZOLOFT PO), Take 100 mg by mouth at bedtime. (Patient not taking: Reported on 08/05/2021), Disp: , Rfl:   Allergies  Allergen Reactions   Depakote [Divalproex Sodium] Other (See Comments)    Pt states that this medication makes her blood levels toxic.     Methylphenidate Derivatives Other (See Comments)    Reaction:  Depression and anger    Neurontin [Gabapentin] Other (See Comments)    Reaction:  Dizziness    Prozac [Fluoxetine Hcl] Other (See Comments)    Reaction:  Anger    Methylphenidate Hcl      SAME AS PREVIOUS LISTED METHYLPHENIDATE    Review of Systems: Negative except for what is mentioned in HPI.  Objective:   Vitals:   08/05/21 1321  BP: (!) 128/92  Pulse: 96  Weight: 240 lb 11.2 oz (109.2  kg)    Fetal Status: Fetal Heart Rate (bpm): 164   Movement: Absent     Physical Exam: BP (!) 128/92   Pulse 96   Wt 240 lb 11.2 oz (109.2 kg)   LMP 05/14/2021   BMI 42.64 kg/m  CONSTITUTIONAL: Well-developed, obese, well-nourished female in no acute distress.  NEUROLOGIC: Alert and oriented to person, place, and time. Normal reflexes, muscle tone coordination. No cranial nerve deficit noted. PSYCHIATRIC: Normal mood and affect. Normal behavior. Normal judgment and thought content. SKIN: Skin is warm and dry. No rash noted. Not diaphoretic. No erythema. No pallor. HENT:  Normocephalic, atraumatic, External right and left ear normal. Oropharynx is clear and moist EYES: Conjunctivae and EOM are normal.  NECK: Normal range of motion, supple, no masses CARDIOVASCULAR: Normal heart rate noted, regular rhythm RESPIRATORY: Effort and breath sounds normal, no problems with respiration noted BREASTS: deferred ABDOMEN: Soft, nontender, nondistended, gravid. GU: deferred MUSCULOSKELETAL: Normal range of motion. EXT:  No edema and no tenderness. 2+ distal pulses.   Assessment and Plan:  Pregnancy: [id number] at 26w6dby LMP  1. [redacted] weeks gestation of pregnancy   2. Nausea and vomiting during pregnancy Rx for zofran sent, pt declined phenergan  3. Supervision of high risk pregnancy, antepartum Continue routine care.  AFP next visit  4. History of preterm delivery, currently pregnant All deliveries are after 34 weeks, not a candidate for makena  5. History of pre-eclampsia in prior pregnancy, currently pregnant Baseline labs and P/C ratio ordered, pt states she cannot take aspirin  6. History of gestational hypertension   7. Grand multipara Desires tubal  ligation   Preterm labor symptoms and general obstetric precautions including but not limited to vaginal bleeding, contractions, leaking of fluid and fetal movement were reviewed in detail with the patient.  Please refer to After Visit Summary for other counseling recommendations.   Return in about 4 weeks (around 09/02/2021) [facility].  LGriffin Basil8/22/2022 1:49 PM

## 2021-08-06 ENCOUNTER — Other Ambulatory Visit: Payer: Self-pay

## 2021-08-06 ENCOUNTER — Telehealth: Payer: 59 | Admitting: Physician Assistant

## 2021-08-06 ENCOUNTER — Encounter: Payer: Self-pay | Admitting: Family Medicine

## 2021-08-06 DIAGNOSIS — M25561 Pain in right knee: Secondary | ICD-10-CM | POA: Diagnosis not present

## 2021-08-06 DIAGNOSIS — G8929 Other chronic pain: Secondary | ICD-10-CM

## 2021-08-06 LAB — HEMOGLOBIN A1C
Est. average glucose Bld gHb Est-mCnc: 111 mg/dL
Hgb A1c MFr Bld: 5.5 % (ref 4.8–5.6)

## 2021-08-06 LAB — CBC/D/PLT+RPR+RH+ABO+RUBIGG...
Antibody Screen: NEGATIVE
Basophils Absolute: 0 10*3/uL (ref 0.0–0.2)
Basos: 0 %
EOS (ABSOLUTE): 0 10*3/uL (ref 0.0–0.4)
Eos: 0 %
HCV Ab: <0.1 s/co ratio (ref 0.0–0.9)
HIV Screen 4th Generation wRfx: NONREACTIVE
Hematocrit: 39.9 % (ref 34.0–46.6)
Hemoglobin: 13.6 g/dL (ref 11.1–15.9)
Hepatitis B Surface Ag: NEGATIVE
Immature Grans (Abs): 0 10*3/uL (ref 0.0–0.1)
Immature Granulocytes: 0 %
Lymphocytes Absolute: 1.5 10*3/uL (ref 0.7–3.1)
Lymphs: 24 %
MCH: 29.7 pg (ref 26.6–33.0)
MCHC: 34.1 g/dL (ref 31.5–35.7)
MCV: 87 fL (ref 79–97)
Monocytes Absolute: 0.3 10*3/uL (ref 0.1–0.9)
Monocytes: 4 %
Neutrophils Absolute: 4.4 10*3/uL (ref 1.4–7.0)
Neutrophils: 72 %
Platelets: 189 10*3/uL (ref 150–450)
RBC: 4.58 x10E6/uL (ref 3.77–5.28)
RDW: 14.5 % (ref 11.7–15.4)
RPR Ser Ql: NONREACTIVE
Rh Factor: POSITIVE
Rubella Antibodies, IGG: 2.45 index (ref >0.99)
WBC: 6.2 10*3/uL (ref 3.4–10.8)

## 2021-08-06 LAB — COMPREHENSIVE METABOLIC PANEL
ALT: 12 IU/L (ref 0–32)
AST: 13 IU/L (ref 0–40)
Albumin/Globulin Ratio: 1.5 (ref 1.2–2.2)
Albumin: 4 g/dL (ref 3.8–4.8)
Alkaline Phosphatase: 75 IU/L (ref 44–121)
BUN/Creatinine Ratio: 12 (ref 9–23)
BUN: 8 mg/dL (ref 6–20)
Bilirubin Total: 0.2 mg/dL (ref 0.0–1.2)
CO2: 20 mmol/L (ref 20–29)
Calcium: 9.6 mg/dL (ref 8.7–10.2)
Chloride: 102 mmol/L (ref 96–106)
Creatinine, Ser: 0.69 mg/dL (ref 0.57–1.00)
Globulin, Total: 2.7 g/dL (ref 1.5–4.5)
Glucose: 88 mg/dL (ref 65–99)
Potassium: 4.6 mmol/L (ref 3.5–5.2)
Sodium: 138 mmol/L (ref 134–144)
Total Protein: 6.7 g/dL (ref 6.0–8.5)
eGFR: 117 mL/min/{1.73_m2} (ref >59)

## 2021-08-06 LAB — PROTEIN / CREATININE RATIO, URINE
Creatinine, Urine: 423.9 mg/dL
Protein, Ur: 35.8 mg/dL
Protein/Creat Ratio: 84 mg/g creat (ref 0–200)

## 2021-08-06 LAB — HCV INTERPRETATION

## 2021-08-06 NOTE — Progress Notes (Signed)
[name], [name] are scheduled for a virtual visit with your provider today.    Just as we do with appointments in the office, we must obtain your consent to participate.  Your consent will be active for this visit and any virtual visit you may have with one of our providers in the next 365 days.    If you have a MyChart account, I can also send a copy of this consent to you electronically.  All virtual visits are billed to your insurance company just like a traditional visit in the office.  As this is a virtual visit, video technology does not allow for your provider to perform a traditional examination.  This may limit your provider's ability to fully assess your condition.  If your provider identifies any concerns that need to be evaluated in person or the need to arrange testing such as labs, EKG, etc, we will make arrangements to do so.    Although advances in technology are sophisticated, we cannot ensure that it will always work on either your end or our end.  If the connection with a video visit is poor, we may have to switch to a telephone visit.  With either a video or telephone visit, we are not always able to ensure that we have a secure connection.   I need to obtain your verbal consent now.   Are you willing to proceed with your visit today?   [name] has provided verbal consent on 08/06/2021 for a virtual visit (video or telephone).   Rodney Booze, PA-C 08/06/2021  1:18 PM   Date:  08/06/2021   ID:  [name], DOB 1986-03-25, MRN [id number]  Patient Location: Home Provider Location: Home Office   Participants: Patient and Provider for Visit and Wrap up  Method of visit: Video  Location of Patient: Home Location of Provider: Home Office Consent was obtain for visit over the video. Services rendered by provider: Visit was performed via video  A video enabled telemedicine application was used and I verified that I am speaking with the correct person  using two identifiers.  PCP:  Patient, No Pcp Per (Inactive)   Chief Complaint:  right knee pain  History of Present Illness:    [name] is a 35 y.o. female with history as stated below. Presents video telehealth for an acute care visit  Pt is c/o right knee pain that is an ongoing. She states she currently follows for ortho for this and her pain is getting worse. She had an MRI yesterday and it showed an ACL tear and meniscal tear. She has several questions about her scan and about if she will need surgery. She is currently taking tylenol for pain. She is currently pregnant.  Past Medical, Surgical, Social History, Allergies, and Medications have been Reviewed.  Past Medical History:  Diagnosis Date   Anxiety    Asthma    inhaler PRN-hasn't used inhaler in months   Attention deficit hyperactivity disorder    Bipolar disorder [facility])    hospitalized as a teen for suicidal ideations and cutting   Depression    no complaints, doing well   Gastritis    GERD (gastroesophageal reflux disease)    History of gestational diabetes in prior pregnancy, currently pregnant 10/13/2017   History of preterm delivery after IOL for preeclampsia, currently pregnant 10/13/2017   Not a 17P candidate   Hx of varicella    Mild preeclampsia 02/09/2016   Vaginal Pap smear,  abnormal     No outpatient medications have been marked as taking for the 08/06/21 encounter (Video Visit) with [place].     Allergies:   Depakote [divalproex sodium], Methylphenidate derivatives, Neurontin [gabapentin], Prozac [fluoxetine hcl], and Methylphenidate hcl   ROS See HPI for history of present illness.  Physical Exam Vitals and nursing note reviewed.  Constitutional:      General: She is not in acute distress.    Appearance: She is well-developed.  HENT:     Head: Atraumatic.  Eyes:     Conjunctiva/sclera: Conjunctivae normal.  Pulmonary:     Effort: Pulmonary effort is normal.   Musculoskeletal:     Cervical back: Neck supple.  Neurological:     Mental Status: She is alert.             MDM: Pt c/o right knee pain. Has chronic acl tear and meniscal tear. She has several questions about whether she will need surgery. I reiterated several times that she needs to follow up with her orthopedic provider for further questions. Given she is pregnant there are limited options for pain control for the patient. I advised that she continue tylenol and get a knee brace OTC to help with sxs.   There are no diagnoses linked to this encounter.   Time:   Today, I have spent 10 minutes with the patient with telehealth technology discussing the above problems, reviewing the chart, previous notes, medications and orders.    Tests Ordered: No orders of the defined types were placed in this encounter.   Medication Changes: No orders of the defined types were placed in this encounter.    Disposition:  Follow up  Signed, Rodney Booze, PA-C  08/06/2021 1:18 PM

## 2021-08-06 NOTE — Telephone Encounter (Signed)
Spoke with patient about results and referring her to ortho - see note attached to her MRI.

## 2021-08-06 NOTE — Telephone Encounter (Signed)
I called and spoke to patient.  See note attached to her MRI.

## 2021-08-06 NOTE — Patient Instructions (Signed)
[facility] [employer], thank you for joining [facility], PA-C for today's virtual visit.  While this provider is not your primary care provider (PCP), if your PCP is located in our provider database this encounter information will be shared with them immediately following your visit.  Consent: (Patient) [name] provided verbal consent for this virtual visit at the beginning of the encounter.  Current Medications:  Current Outpatient Medications:    albuterol (VENTOLIN HFA) 108 (90 Base) MCG/ACT inhaler, Inhale 1-2 puffs into the lungs every 6 (six) hours as needed for wheezing or shortness of breath. (Patient not taking: No sig reported), Disp: 18 g, Rfl: 0   ALPRAZolam (XANAX) 0.5 MG tablet, Take 0.5 mg by mouth at bedtime as needed for anxiety. (Patient not taking: Reported on 08/05/2021), Disp: , Rfl:    ARIPiprazole (ABILIFY IM), Inject into the muscle every 30 (thirty) days., Disp: , Rfl:    ARIPiprazole (ABILIFY) 2 MG tablet, Take 2 mg by mouth at bedtime. (Patient not taking: Reported on 08/05/2021), Disp: , Rfl:    chlorpheniramine-HYDROcodone (TUSSIONEX PENNKINETIC ER) 10-8 MG/5ML SUER, Take 5 mLs by mouth every 12 (twelve) hours as needed for cough., Disp: 50 mL, Rfl: 0   Doxylamine-Pyridoxine (DICLEGIS) 10-10 MG TBEC, Take 2 tablets at bedtime. If this does not relieve your symptoms of nausea and vomiting after several days you may take one additional tablet at breakfast. If this does not relieve your symptoms after several days you may take one additional tablet at lunch.. (Patient not taking: No sig reported), Disp: 120 tablet, Rfl: 2   guaifenesin (ROBITUSSIN) 100 MG/5ML syrup, Take 10 mLs (200 mg total) by mouth 3 (three) times daily as needed for cough., Disp: 120 mL, Rfl: 0   lidocaine (LIDODERM) 5 %, Place 1 patch onto the skin daily. Remove & Discard patch within 12 hours or as directed by MD (Patient not taking: Reported on 08/05/2021), Disp: 30 patch, Rfl: 0    ondansetron (ZOFRAN ODT) 4 MG disintegrating tablet, Take 1 tablet (4 mg total) by mouth every 6 (six) hours as needed for nausea., Disp: 20 tablet, Rfl: 1   Prenatal Vit-Fe Fumarate-FA (PRENATAL MULTIVITAMIN) TABS tablet, Take 1 tablet by mouth daily at 12 noon., Disp: , Rfl:    promethazine (PHENERGAN) 25 MG tablet, Take 1 tablet (25 mg total) by mouth every 6 (six) hours as needed for nausea or vomiting. (Patient not taking: Reported on 08/05/2021), Disp: 30 tablet, Rfl: 0   Sertraline HCl (ZOLOFT PO), Take 100 mg by mouth at bedtime. (Patient not taking: Reported on 08/05/2021), Disp: , Rfl:    Medications ordered in this encounter:  No orders of the defined types were placed in this encounter.    *If you need refills on other medications prior to your next appointment, please contact your pharmacy*  Follow-Up: Call back or seek an in-person evaluation if the symptoms worsen or if the condition fails to improve as anticipated.  Other Instructions Continue taking tylenol. Use the knee brace as directed. Please call your orthopedic provider for further recommendations for you knee pain and MRI results.   If you have been instructed to have an in-person evaluation today at a local Urgent Care facility, please use the link below. It will take you to a list of all of our available [facility], including address, phone number and hours of operation. Please do not delay care.  [facility]  If you or a family member  do not have a primary care provider, use the link below to schedule a visit and establish care. When you choose a [facility] primary care physician or advanced practice provider, you gain a long-term partner in health. Find a Primary Care Provider  Learn more about [facility]'s in-office and virtual care options: [facility]

## 2021-08-07 ENCOUNTER — Ambulatory Visit: Payer: 59 | Admitting: Family Medicine

## 2021-08-07 LAB — URINE CULTURE, OB REFLEX

## 2021-08-07 LAB — CULTURE, OB URINE

## 2021-08-07 MED ORDER — HYDROCODONE-ACETAMINOPHEN 5-325 MG PO TABS: 1.0000 | ORAL_TABLET | Freq: Four times a day (QID) | ORAL | 0 refills | Status: DC | PRN

## 2021-08-07 NOTE — Progress Notes (Signed)
Sending in refill of norco - only 15 tabs - to bridge to appointment with ortho.

## 2021-08-07 NOTE — Addendum Note (Signed)
Addended by: [name] on: 08/07/2021 10:51 AM   Modules accepted: Orders

## 2021-08-11 ENCOUNTER — Telehealth: Payer: Self-pay | Admitting: Obstetrics & Gynecology

## 2021-08-11 MED ORDER — METOCLOPRAMIDE HCL 10 MG PO TABS: 10.0000 mg | ORAL_TABLET | Freq: Three times a day (TID) | ORAL | 1 refills | Status: DC | PRN

## 2021-08-11 NOTE — Telephone Encounter (Signed)
Nurse on the line with the patient.  She reports nausea/vomiting- has had about 4 episodes.  Able to keep some liquids down.  Started Lexapro 3 days ago and wanted to know if she should continue with this medication.  Ok to continue with this medication.  Phenergan sent in to take as needed.  If no improvement or worsening of symptoms let us know.  RN given the above info.  Called pt back to confirm pharmacy.  She actually has zofran at home and is concerned that phenergan will not work since she has taken this in the past.  Will send in [place] instead.   Also had questions regarding Lexapro.  Reports that Thorazine has worked for her in the past and wishes to try this instead.  Advised pt to continue on Lexapro for now and then review with Dr. Ernestina Patches at her next visit.  Janyth Pupa, DO Attending [place], [facility] for [employer], [place]

## 2021-08-12 ENCOUNTER — Encounter: Payer: Self-pay | Admitting: *Deleted

## 2021-08-16 ENCOUNTER — Telehealth: Payer: Self-pay

## 2021-08-16 NOTE — Telephone Encounter (Signed)
Returned patients call. Was not able to find Panorama results in the office this morning, should be back anytime.   Patient asked for Reglan, she reports she is taking Zofran 4 mg every 4-6 hours and it is not enough. She reports Diclegis is not covered by her insurance and Phenergan does not work.   An order for Reglan is in the computer from 8/28. Asked her to call the pharmacy to get it filled to pick up.

## 2021-08-16 NOTE — Telephone Encounter (Signed)
Patient on the phone wanting to know if we can give her, her test results are. her panorama results said she called the place and they told her they sent the results to our office     Please call and advise  Patient is asking for a call back today states we never call her back in a timley fashion and she knows we close at 12 today

## 2021-08-28 ENCOUNTER — Ambulatory Visit: Payer: 59 | Admitting: Orthopedic Surgery

## 2021-08-28 ENCOUNTER — Telehealth: Payer: Self-pay

## 2021-08-28 DIAGNOSIS — O219 Vomiting of pregnancy, unspecified: Secondary | ICD-10-CM

## 2021-08-28 MED ORDER — DOXYLAMINE-PYRIDOXINE 10-10 MG PO TBEC
DELAYED_RELEASE_TABLET | ORAL | 2 refills | Status: DC
Start: 2021-08-28 — End: 2021-09-01

## 2021-08-28 MED ORDER — DOXYLAMINE-PYRIDOXINE 10-10 MG PO TBEC
DELAYED_RELEASE_TABLET | ORAL | 2 refills | Status: DC
Start: 2021-08-28 — End: 2021-08-28

## 2021-08-28 NOTE — Telephone Encounter (Addendum)
Called pt to follow up on MyChart message. Pt notified our office via [place] message that she was seen by orthopedic surgeon who recommends surgery, but cannot pursue this without clearance from OB/GYN. Pt states she was seen by Dorna Leitz, MD at [place]. Called office for further information, was transferred to surgery scheduler, left VM requesting returned phone call. Notified patient that I will call her back once I have heard from [facility]. Pt verbalizes understanding.   Call received from [facility] staff who states that per [place], MD's note, surgery was not recommended. Pt was offered cortisone injections and was told that surgery would not be offered until after delivery. Staff member states they will ask Dr. Berenice Primas to send his recommendation to our office. Called pt, explained that she may call their office to schedule cortisone injections, but surgery has not been offered. Pt is concerned due to limitations from knee injury, reports difficulty walking. I encouraged pt to follow up with orthopedic surgeon regarding these concerns.  Pt states that she is still experiencing nausea. Currently taking phenergan and zofran, still endorses nausea. States insurance did not [employer]. Explained I will call pharmacy to follow up. [facility], staff member states rx will need to be resent. Rx reordered, will call pharmacy a second time. Pharmacy called, states this will not be covered by [employer]. Pharmacy unable to submit to [facility] insurance due to this being secondary. Prior authorization submitted via faxed form to Medicaid to request coverage.

## 2021-09-01 ENCOUNTER — Telehealth: Payer: 59 | Admitting: Emergency Medicine

## 2021-09-01 ENCOUNTER — Other Ambulatory Visit: Payer: Self-pay

## 2021-09-01 DIAGNOSIS — J069 Acute upper respiratory infection, unspecified: Secondary | ICD-10-CM | POA: Diagnosis not present

## 2021-09-01 DIAGNOSIS — O219 Vomiting of pregnancy, unspecified: Secondary | ICD-10-CM | POA: Diagnosis not present

## 2021-09-01 MED ORDER — ONDANSETRON 4 MG PO TBDP: 4.0000 mg | ORAL_TABLET | Freq: Four times a day (QID) | ORAL | 1 refills | Status: DC | PRN

## 2021-09-01 NOTE — Progress Notes (Signed)
Virtual Visit Consent   [name], you are scheduled for a virtual visit with a [facility] provider today.     Just as with appointments in the office, your consent must be obtained to participate.  Your consent will be active for this visit and any virtual visit you may have with one of our providers in the next 365 days.     If you have a MyChart account, a copy of this consent can be sent to you electronically.  All virtual visits are billed to your insurance company just like a traditional visit in the office.    As this is a virtual visit, video technology does not allow for your provider to perform a traditional examination.  This may limit your provider's ability to fully assess your condition.  If your provider identifies any concerns that need to be evaluated in person or the need to arrange testing (such as labs, EKG, etc.), we will make arrangements to do so.     Although advances in technology are sophisticated, we cannot ensure that it will always work on either your end or our end.  If the connection with a video visit is poor, the visit may have to be switched to a telephone visit.  With either a video or telephone visit, we are not always able to ensure that we have a secure connection.     I need to obtain your verbal consent now.   Are you willing to proceed with your visit today?    Marixa Hindes Hainsworth has provided verbal consent on 09/01/2021 for a virtual visit (video or telephone).   Carvel Getting, NP   Date: 09/01/2021 5:56 PM   Virtual Visit via Video Note   I, Carvel Getting, connected with  PRAKRITI WALENTA  (RN:[id number], [birth year]) on 09/01/21 at  5:45 PM EDT by a video-enabled telemedicine application and verified that I am speaking with the correct person using two identifiers.  Location: Patient: Virtual Visit Location Patient: Home Provider: Virtual Visit Location Provider: Home Office   I discussed the limitations of evaluation and management by  telemedicine and the availability of in person appointments. The patient expressed understanding and agreed to proceed.    History of Present Illness: [name] is a 35 y.o. who identifies as a female who was assigned female at birth, and is being seen today for vomiting once a day since she was [redacted] weeks pregnant (patient is now [redacted] weeks pregnant).  She is out of her Zofran and would like a refill.  Patient also reports feeling sick with cold symptoms for a week.  Reports runny nose/nasal congestion.  She has been using Flonase nasal spray.  Has tested negative for COVID 4 times this week.  Denies fever, headache, muscle aches, shortness of breath.  Her husband is sick with a cold as well.  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) 06/02/2012    Class: Acute   GERD (gastroesophageal reflux disease) 05/19/2012   Borderline behavior 04/12/2012    Class: Acute    Allergies:  Allergies  Allergen Reactions   Depakote [Divalproex Sodium] Other (See Comments)    Pt states that this medication makes her blood levels toxic.     Methylphenidate Derivatives  Other (See Comments)    Reaction:  Depression and anger    Neurontin [Gabapentin] Other (See Comments)    Reaction:  Dizziness    Prozac [Fluoxetine Hcl] Other (See Comments)    Reaction:  Anger    Methylphenidate Hcl     SAME AS PREVIOUS LISTED METHYLPHENIDATE   Medications:  Current Outpatient Medications:    albuterol (VENTOLIN HFA) 108 (90 Base) MCG/ACT inhaler, Inhale 1-2 puffs into the lungs every 6 (six) hours as needed for wheezing or shortness of breath. (Patient not taking: No sig reported), Disp: 18 g, Rfl: 0   ALPRAZolam (XANAX) 0.5 MG tablet, Take 0.5 mg by mouth at bedtime as needed for anxiety. (Patient not taking: Reported on 08/05/2021), Disp: , Rfl:    ARIPiprazole (ABILIFY) 2 MG tablet, Take 2 mg by mouth at bedtime. (Patient not taking: Reported on 08/05/2021), Disp: , Rfl:    HYDROcodone-acetaminophen (NORCO) 5-325 MG tablet, Take 1 tablet by mouth every 6 (six) hours as needed for moderate pain., Disp: 15 tablet, Rfl: 0   lidocaine (LIDODERM) 5 %, Place 1 patch onto the skin daily. Remove & Discard patch within 12 hours or as directed by MD (Patient not taking: Reported on 08/05/2021), Disp: 30 patch, Rfl: 0   ondansetron (ZOFRAN ODT) 4 MG disintegrating tablet, Take 1 tablet (4 mg total) by mouth every 6 (six) hours as needed for nausea., Disp: 20 tablet, Rfl: 1   Prenatal Vit-Fe Fumarate-FA (PRENATAL MULTIVITAMIN) TABS tablet, Take 1 tablet by mouth daily at 12 noon., Disp: , Rfl:    promethazine (PHENERGAN) 25 MG tablet, Take 1 tablet (25 mg total) by mouth every 6 (six) hours as needed for nausea or vomiting. (Patient not taking: Reported on 08/05/2021), Disp: 30 tablet, Rfl: 0   Sertraline HCl (ZOLOFT PO), Take 100 mg by mouth at bedtime. (Patient not taking: Reported on 08/05/2021), Disp: , Rfl:   Observations/Objective: Patient is well-developed, well-nourished in no acute distress.  Resting comfortably  at home.  Head is normocephalic, atraumatic.  No labored  breathing.  Speech is clear and coherent with logical content.  Patient is alert and oriented at baseline.    Assessment and Plan: 1. Nausea and vomiting during pregnancy - ondansetron (ZOFRAN ODT) 4 MG disintegrating tablet; Take 1 tablet (4 mg total) by mouth every 6 (six) hours as needed for nausea.  Dispense: 20 tablet; Refill: 1  2. Viral URI  Instructed to continue using Flonase as directed on the package, and suggested adding nasal saline spray or irrigation to help relieve runny nose/nasal congestion.  Refill prescription of Zofran.  Patient needs to follow-up with obstetrician if she feels her pregnancy-related vomiting is changing from its usual pattern.  Follow Up Instructions: I discussed the assessment and treatment plan with the patient. The patient was provided an opportunity to ask questions and all were answered. The patient agreed with the plan and demonstrated an understanding of the instructions.  A copy of  instructions were sent to the patient via [facility].  The patient was advised to call back or seek an in-person evaluation if the symptoms worsen or if the condition fails to improve as anticipated.  Time:  I spent 12 minutes with the patient via telehealth technology discussing the above problems/concerns.    Carvel Getting, NP

## 2021-09-01 NOTE — Patient Instructions (Signed)
[facility], thank you for joining Carvel Getting, NP for today's virtual visit.  While this provider is not your primary care provider (PCP), if your PCP is located in our provider database this encounter information will be shared with them immediately following your visit.  Consent: (Patient) [name] provided verbal consent for this virtual visit at the beginning of the encounter.  Current Medications:  Current Outpatient Medications:    albuterol (VENTOLIN HFA) 108 (90 Base) MCG/ACT inhaler, Inhale 1-2 puffs into the lungs every 6 (six) hours as needed for wheezing or shortness of breath. (Patient not taking: No sig reported), Disp: 18 g, Rfl: 0   ALPRAZolam (XANAX) 0.5 MG tablet, Take 0.5 mg by mouth at bedtime as needed for anxiety. (Patient not taking: Reported on 08/05/2021), Disp: , Rfl:    ARIPiprazole (ABILIFY) 2 MG tablet, Take 2 mg by mouth at bedtime. (Patient not taking: Reported on 08/05/2021), Disp: , Rfl:    HYDROcodone-acetaminophen (NORCO) 5-325 MG tablet, Take 1 tablet by mouth every 6 (six) hours as needed for moderate pain., Disp: 15 tablet, Rfl: 0   lidocaine (LIDODERM) 5 %, Place 1 patch onto the skin daily. Remove & Discard patch within 12 hours or as directed by MD (Patient not taking: Reported on 08/05/2021), Disp: 30 patch, Rfl: 0   ondansetron (ZOFRAN ODT) 4 MG disintegrating tablet, Take 1 tablet (4 mg total) by mouth every 6 (six) hours as needed for nausea., Disp: 20 tablet, Rfl: 1   Prenatal Vit-Fe Fumarate-FA (PRENATAL MULTIVITAMIN) TABS tablet, Take 1 tablet by mouth daily at 12 noon., Disp: , Rfl:    promethazine (PHENERGAN) 25 MG tablet, Take 1 tablet (25 mg total) by mouth every 6 (six) hours as needed for nausea or vomiting. (Patient not taking: Reported on 08/05/2021), Disp: 30 tablet, Rfl: 0   Sertraline HCl (ZOLOFT PO), Take 100 mg by mouth at bedtime. (Patient not taking: Reported on 08/05/2021), Disp: , Rfl:    Medications ordered in this  encounter:  Meds ordered this encounter  Medications   ondansetron (ZOFRAN ODT) 4 MG disintegrating tablet    Sig: Take 1 tablet (4 mg total) by mouth every 6 (six) hours as needed for nausea.    Dispense:  20 tablet    Refill:  1     *If you need refills on other medications prior to your next appointment, please contact your pharmacy*  Follow-Up: Call back or seek an in-person evaluation if the symptoms worsen or if the condition fails to improve as anticipated.  Other Instructions Follow-up with your obstetrician as scheduled for prenatal care.  If you feel like you are morning sickness/vomiting is worsening or the Zofran is no longer helping, please talk to your obstetrician as well.  Continue to use Flonase 2 sprays in each nostril every day to help with runny nose.  You can also use nasal saline spray to help relieve your runny nose or congestion.  If you have pain or fever, you can take Tylenol as directed on the package.   If you have been instructed to have an in-person evaluation today at a local Urgent Care facility, please use the link below. It will take you to a list of all of our available [facility], including address, phone number and hours of operation. Please do not delay care.  [facility]  If you or a family member do not have a primary care provider, use the link below to  schedule a visit and establish care. When you choose a [facility] primary care physician or advanced practice provider, you gain a long-term partner in health. Find a Primary Care Provider  Learn more about [facility]'s in-office and virtual care options: [facility]

## 2021-09-02 ENCOUNTER — Encounter: Payer: Self-pay | Admitting: General Practice

## 2021-09-02 ENCOUNTER — Encounter: Payer: 59 | Admitting: Family Medicine

## 2021-09-03 ENCOUNTER — Telehealth: Payer: Self-pay | Admitting: *Deleted

## 2021-09-03 NOTE — Telephone Encounter (Signed)
Pt called office and spoke with front office personnel. She reported having Braxton-Hicks contractions and requested medical advice. I returned call to pt and discussed her concern. She stated that last night she had 4 pains in her abdomen which felt like Braxton - Hicks contractions (tight, crampy). Pt was informed that "growing pains" are common @ this point in pregnancy and can feel similar to false labor contractions. She also had not had BM for 2 weeks and thought she was constipated. Today she had normal BM. Pt was advised that constipation can contribute to abdominal aches and pains during pregnancy. Dietary recommendations were provided. She should also begin taking stool softener daily. Rx for Colace offered however pt stated that she has this @ home. Pt has next appt on 10/6 - had to cancel Ob f/u yesterday due to having flu-like sx. She voiced understanding of all information and instructions given.

## 2021-09-14 ENCOUNTER — Encounter: Payer: Self-pay | Admitting: Radiology

## 2021-09-18 ENCOUNTER — Telehealth: Payer: Self-pay | Admitting: Obstetrics & Gynecology

## 2021-09-18 ENCOUNTER — Ambulatory Visit: Payer: 59

## 2021-09-18 NOTE — Telephone Encounter (Signed)
Call from [facility]- BP 170/115. Denies headache or blurry vision.  Pt states she has a h/o HTN in prior pregnancies, but no other time.  Not currently taking medication and cannot take ASA (allergic).  Pt has appointment tomorrow.  Advised that it will be very important for her to go to her appointment tomorrow.  Discussed that if she has high BP in office it may be likely for them to start a medication for BP management.  Pt seemed agreeable to this plan.  Janyth Pupa, DO Attending [place], [facility] for [employer], [place]

## 2021-09-19 ENCOUNTER — Other Ambulatory Visit: Payer: Self-pay

## 2021-09-19 ENCOUNTER — Ambulatory Visit: Payer: 59 | Attending: Medical

## 2021-09-19 ENCOUNTER — Ambulatory Visit: Payer: 59 | Admitting: *Deleted

## 2021-09-19 ENCOUNTER — Encounter: Payer: 59 | Admitting: Obstetrics & Gynecology

## 2021-09-19 VITALS — BP 114/76 | HR 83

## 2021-09-19 DIAGNOSIS — O0990 Supervision of high risk pregnancy, unspecified, unspecified trimester: Secondary | ICD-10-CM

## 2021-09-19 DIAGNOSIS — O09899 Supervision of other high risk pregnancies, unspecified trimester: Secondary | ICD-10-CM | POA: Diagnosis present

## 2021-09-19 DIAGNOSIS — Z641 Problems related to multiparity: Secondary | ICD-10-CM | POA: Insufficient documentation

## 2021-09-19 DIAGNOSIS — Z8759 Personal history of other complications of pregnancy, childbirth and the puerperium: Secondary | ICD-10-CM

## 2021-09-19 DIAGNOSIS — O09299 Supervision of pregnancy with other poor reproductive or obstetric history, unspecified trimester: Secondary | ICD-10-CM | POA: Diagnosis present

## 2021-09-20 ENCOUNTER — Telehealth: Payer: Self-pay | Admitting: Family Medicine

## 2021-09-20 ENCOUNTER — Encounter: Payer: Self-pay | Admitting: Radiology

## 2021-09-20 ENCOUNTER — Telehealth: Payer: Self-pay

## 2021-09-20 DIAGNOSIS — O0990 Supervision of high risk pregnancy, unspecified, unspecified trimester: Secondary | ICD-10-CM

## 2021-09-20 NOTE — Telephone Encounter (Signed)
Elevated BP alert called from [facility]. BP 124/90. Patient did not report symptoms. [facility]

## 2021-09-20 NOTE — Telephone Encounter (Signed)
Pt called back into office about [place] results from MFM. Pt advised will have provider look at results and return call to pt. Pt agreeable to plan of care. Pt also asked about elevated BP. Pt advises only upset about [place] results right now and denies all s/s of HTN.  Dr Dione Plover given message about [place] results.   Colletta Maryland, RN

## 2021-09-20 NOTE — Telephone Encounter (Signed)
Called patient at her request through the after hours nurse line  Verified patient identity  Patient with many questions about her ultrasound. She was reading her results on MyChart and she was "unable to stay" yesterday.   She was worried about the the findings and we had a long discussion about the findings and the recommendations for [facility] labs, Amniocentesis and repeat US.   The patient was repetitive in her questioning and wanted me to "tell her that her baby was normal" and I continued to reiterate findings and provided the guidance that she need to speak to MFM about their recommendations. I recommended she call the office on Monday to be scheduled for a lab only visit for [facility] labs.   Spent 20  minutes on the phone with the patient mostly repeating the above.   Of note the patient did have CF screening in 2019 that was negative for the most common mutations.   Caren Macadam, MD, MPH, ABFM, [facility] Attending [facility]ician [facility] for [facility]

## 2021-09-21 ENCOUNTER — Encounter: Payer: Self-pay | Admitting: Nurse Practitioner

## 2021-09-21 ENCOUNTER — Telehealth: Payer: 59 | Admitting: Nurse Practitioner

## 2021-09-21 DIAGNOSIS — R21 Rash and other nonspecific skin eruption: Secondary | ICD-10-CM | POA: Diagnosis not present

## 2021-09-21 DIAGNOSIS — W57XXXA Bitten or stung by nonvenomous insect and other nonvenomous arthropods, initial encounter: Secondary | ICD-10-CM | POA: Diagnosis not present

## 2021-09-21 MED ORDER — MUPIROCIN CALCIUM 2 % EX CREA: 1.0000 "application " | TOPICAL_CREAM | Freq: Two times a day (BID) | CUTANEOUS | 0 refills | Status: DC

## 2021-09-21 NOTE — Progress Notes (Signed)
Virtual Visit Consent   [name], you are scheduled for a virtual visit with a [facility] provider today.     Just as with appointments in the office, your consent must be obtained to participate.  Your consent will be active for this visit and any virtual visit you may have with one of our providers in the next 365 days.     If you have a MyChart account, a copy of this consent can be sent to you electronically.  All virtual visits are billed to your insurance company just like a traditional visit in the office.    As this is a virtual visit, video technology does not allow for your provider to perform a traditional examination.  This may limit your provider's ability to fully assess your condition.  If your provider identifies any concerns that need to be evaluated in person or the need to arrange testing (such as labs, EKG, etc.), we will make arrangements to do so.     Although advances in technology are sophisticated, we cannot ensure that it will always work on either your end or our end.  If the connection with a video visit is poor, the visit may have to be switched to a telephone visit.  With either a video or telephone visit, we are not always able to ensure that we have a secure connection.     I need to obtain your verbal consent now.   Are you willing to proceed with your visit today?    [name] has provided verbal consent on 09/21/2021 for a virtual visit (video or telephone).   Gildardo Pounds, NP   Date: 09/21/2021 2:07 PM   Virtual Visit via Video Note   I, Gildardo Pounds, connected with  [name]  ([id number], 1986-12-10) on 09/21/21 at  2:00 PM EDT by a video-enabled telemedicine application and verified that I am speaking with the correct person using two identifiers.  Location: Patient: Virtual Visit Location Patient: Home Provider: Virtual Visit Location Provider: Home   I discussed the limitations of evaluation and management by  telemedicine and the availability of in person appointments. The patient expressed understanding and agreed to proceed.    History of Present Illness: [name] is a 35 y.o. who identifies as a female who was assigned female at birth, and is being seen today for evaluation of suspected "flea bites".  HPI:  She is [redacted] weeks pregnant. Onset of skin lesions within the past 24 hours. Her dog was recently treated for flea infestation and she believes she has been bitten by fleas. Denies possible bed bug bites. She has numerous red macular lesions on both arms. Associated symptoms: pruritis. She denies any purulent drainage or open sores.       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     Methylphenidate Derivatives Other (See Comments)    Reaction:  Depression and anger    Neurontin [Gabapentin] Other (See Comments)     Reaction:  Dizziness    Prozac [Fluoxetine Hcl] Other (See Comments)    Reaction:  Anger    Methylphenidate Hcl     SAME AS PREVIOUS LISTED METHYLPHENIDATE   Medications:  Current Outpatient Medications:    mupirocin cream (BACTROBAN) 2 %, Apply 1 application topically 2 (two) times daily., Disp: 30 g, Rfl: 0   albuterol (VENTOLIN HFA) 108 (90 Base) MCG/ACT inhaler, Inhale 1-2 puffs into the lungs every 6 (six) hours as needed for wheezing or shortness of breath. (Patient not taking: No sig reported), Disp: 18 g, Rfl: 0   ALPRAZolam (XANAX) 0.5 MG tablet, Take 0.5 mg by mouth at bedtime as needed for anxiety. (Patient not taking: Reported on 08/05/2021), Disp: , Rfl:    ARIPiprazole (ABILIFY) 2 MG tablet, Take 2 mg by mouth at bedtime. (Patient not taking: Reported on 08/05/2021), Disp: , Rfl:    HYDROcodone-acetaminophen (NORCO) 5-325 MG tablet, Take 1 tablet by mouth every 6 (six) hours as needed for moderate pain., Disp: 15 tablet, Rfl: 0   lidocaine (LIDODERM) 5 %, Place 1 patch onto the skin daily. Remove & Discard patch within 12 hours or as directed by MD (Patient not taking: Reported on 08/05/2021), Disp: 30 patch, Rfl: 0   ondansetron (ZOFRAN ODT) 4 MG disintegrating tablet, Take 1 tablet (4 mg total) by mouth every 6 (six) hours as needed for nausea., Disp: 20 tablet, Rfl: 1   Prenatal Vit-Fe Fumarate-FA (PRENATAL MULTIVITAMIN) TABS tablet, Take 1 tablet by mouth daily at 12 noon., Disp: , Rfl:    promethazine (PHENERGAN) 25 MG tablet, Take 1 tablet (25 mg total) by mouth every 6 (six) hours as needed for nausea or vomiting. (Patient not taking: Reported on 08/05/2021), Disp: 30 tablet, Rfl: 0   Sertraline HCl (ZOLOFT PO), Take 100 mg by mouth at bedtime. (Patient not taking: Reported on 08/05/2021), Disp: , Rfl:   Observations/Objective: Patient is well-developed, well-nourished in no acute distress.  Resting comfortably  at home.  Head is normocephalic, atraumatic.  No labored  breathing.  Speech is clear and coherent with logical content.  Patient is alert and oriented at baseline.  She has numerous red macular circular lesions on both arms  Assessment and Plan: 1. Multiple insect bites - mupirocin cream (BACTROBAN) 2 %; Apply 1 application topically 2 (two) times daily.  Dispense: 30 g; Refill: 0   Follow Up Instructions: I discussed the assessment and treatment plan with the patient. The patient was provided an opportunity to ask questions and all were answered. The patient agreed with the plan and demonstrated an understanding of the instructions.  A copy of instructions were sent to the patient via MyChart unless otherwise noted below.    The patient was advised to call back or seek an in-person evaluation if the symptoms worsen or if the condition fails to improve as anticipated.  Time:  I spent 10 minutes with the patient via telehealth technology discussing the above problems/concerns.  Gildardo Pounds, NP

## 2021-09-23 ENCOUNTER — Ambulatory Visit: Payer: 59 | Admitting: *Deleted

## 2021-09-23 ENCOUNTER — Encounter: Payer: Self-pay | Admitting: *Deleted

## 2021-09-23 ENCOUNTER — Other Ambulatory Visit: Payer: Self-pay

## 2021-09-23 ENCOUNTER — Other Ambulatory Visit: Payer: Self-pay | Admitting: *Deleted

## 2021-09-23 ENCOUNTER — Ambulatory Visit: Payer: 59 | Attending: Obstetrics and Gynecology

## 2021-09-23 ENCOUNTER — Ambulatory Visit (HOSPITAL_BASED_OUTPATIENT_CLINIC_OR_DEPARTMENT_OTHER): Payer: 59 | Admitting: Obstetrics

## 2021-09-23 ENCOUNTER — Ambulatory Visit (HOSPITAL_BASED_OUTPATIENT_CLINIC_OR_DEPARTMENT_OTHER): Payer: 59

## 2021-09-23 ENCOUNTER — Other Ambulatory Visit: Payer: Self-pay | Admitting: Obstetrics

## 2021-09-23 ENCOUNTER — Ambulatory Visit: Payer: 59

## 2021-09-23 VITALS — BP 125/84 | HR 107

## 2021-09-23 DIAGNOSIS — O99212 Obesity complicating pregnancy, second trimester: Secondary | ICD-10-CM

## 2021-09-23 DIAGNOSIS — E669 Obesity, unspecified: Secondary | ICD-10-CM | POA: Diagnosis not present

## 2021-09-23 DIAGNOSIS — O24112 Pre-existing diabetes mellitus, type 2, in pregnancy, second trimester: Secondary | ICD-10-CM | POA: Diagnosis not present

## 2021-09-23 DIAGNOSIS — O283 Abnormal ultrasonic finding on antenatal screening of mother: Secondary | ICD-10-CM

## 2021-09-23 DIAGNOSIS — O99343 Other mental disorders complicating pregnancy, third trimester: Secondary | ICD-10-CM | POA: Diagnosis not present

## 2021-09-23 DIAGNOSIS — O35DXX0 Maternal care for other (suspected) fetal abnormality and damage, fetal gastrointestinal anomalies, not applicable or unspecified: Secondary | ICD-10-CM

## 2021-09-23 DIAGNOSIS — O99342 Other mental disorders complicating pregnancy, second trimester: Secondary | ICD-10-CM

## 2021-09-23 DIAGNOSIS — O358XX0 Maternal care for other (suspected) fetal abnormality and damage, not applicable or unspecified: Secondary | ICD-10-CM | POA: Diagnosis not present

## 2021-09-23 DIAGNOSIS — O09292 Supervision of pregnancy with other poor reproductive or obstetric history, second trimester: Secondary | ICD-10-CM | POA: Diagnosis not present

## 2021-09-23 DIAGNOSIS — O0990 Supervision of high risk pregnancy, unspecified, unspecified trimester: Secondary | ICD-10-CM

## 2021-09-23 DIAGNOSIS — Z3A18 18 weeks gestation of pregnancy: Secondary | ICD-10-CM | POA: Diagnosis not present

## 2021-09-23 DIAGNOSIS — Z8759 Personal history of other complications of pregnancy, childbirth and the puerperium: Secondary | ICD-10-CM

## 2021-09-23 DIAGNOSIS — E118 Type 2 diabetes mellitus with unspecified complications: Secondary | ICD-10-CM | POA: Diagnosis not present

## 2021-09-23 DIAGNOSIS — O09212 Supervision of pregnancy with history of pre-term labor, second trimester: Secondary | ICD-10-CM

## 2021-09-23 DIAGNOSIS — Z641 Problems related to multiparity: Secondary | ICD-10-CM

## 2021-09-23 DIAGNOSIS — J45909 Unspecified asthma, uncomplicated: Secondary | ICD-10-CM | POA: Diagnosis not present

## 2021-09-23 DIAGNOSIS — O99512 Diseases of the respiratory system complicating pregnancy, second trimester: Secondary | ICD-10-CM | POA: Insufficient documentation

## 2021-09-23 DIAGNOSIS — O09299 Supervision of pregnancy with other poor reproductive or obstetric history, unspecified trimester: Secondary | ICD-10-CM

## 2021-09-23 DIAGNOSIS — O09899 Supervision of other high risk pregnancies, unspecified trimester: Secondary | ICD-10-CM

## 2021-09-23 NOTE — Progress Notes (Signed)
MFM Note  [name] was seen for a limited ultrasound and consultation as echogenic bowel was noted during her fetal anatomy scan last week.  She could not stay for a consultation and returned today to discuss the significance of the echogenic bowel noted in her fetus.  She denies any problems in her current pregnancy.    She had a cell free DNA test earlier in her pregnancy which indicated a low risk for trisomy 65, 72, and 13. A female fetus is predicted.   On today's exam, echogenic bowel was noted.   The causes of echogenic bowel including a normal variant, fetal aneuploidy, swallowed  blood, cystic fibrosis, and viral infections were discussed. The association of bowel malrotation/atresia associated with echogenic bowel was also discussed. She denies any recent vaginal bleeding.   She has screened negative for cystic fibrosis.  Due to the echogenic bowel noted today, the patient was offered and declined an amniocentesis for definitive diagnosis of fetal aneuploidy.  She is comfortable with her negative cell free DNA test.   The limitations of ultrasound in the detection of all anomalies was discussed today.   Due to the echogenic bowel noted today, she was sent to the lab to be screened for CMV and toxoplasmosis.  Due to the potential association of fetal growth restriction with echogenic bowel, we will continue to follow her with growth ultrasounds throughout her pregnancy.  We will also assess for signs of bowel dilatation which may be a sign of gut malrotation or atresia later in her pregnancy.  A follow up exam was scheduled in 4 weeks.   The patient stated that all of her questions had been answered.  A total of 20 minutes was spent counseling and coordinating the care for this patient.  Greater than 50% of the time was spent in direct face-to-face contact.

## 2021-09-24 ENCOUNTER — Telehealth: Payer: Self-pay | Admitting: Family Medicine

## 2021-09-24 LAB — INFECT DISEASE AB IGM REFLEX 1

## 2021-09-24 LAB — CMV IGM: CMV IgM Ser EIA-aCnc: <30 AU/mL (ref 0.0–29.9)

## 2021-09-24 LAB — TOXOPLASMA GONDII ANTIBODY, IGM: Toxoplasma Antibody- IgM: <3 AU/mL (ref 0.0–7.9)

## 2021-09-24 LAB — CMV ANTIBODY, IGG (EIA): CMV Ab - IgG: 3.8 U/mL — ABNORMAL HIGH (ref 0.00–0.59)

## 2021-09-24 LAB — TOXOPLASMA GONDII ANTIBODY, IGG: Toxoplasma IgG Ratio: <3 IU/mL (ref 0.0–7.1)

## 2021-09-24 NOTE — Telephone Encounter (Signed)
Calling to get her test results [phone].

## 2021-09-27 ENCOUNTER — Ambulatory Visit: Payer: 59

## 2021-09-27 ENCOUNTER — Telehealth: Payer: Self-pay

## 2021-09-27 NOTE — Telephone Encounter (Signed)
Alert received from [facility] of elevated BP 117/96. No symptoms reported.  Called pt. Pt states she has not rechecked blood pressure. Reports headache. Has not taken any medication for this. States Tylenol does not improve headache. Patient rechecked BP: 115/90. Agreeable to BP check appt on Monday. Pt to bring BP cuff to appt.   Pt inquires about results from lab work drawn by MFM. I explained that I do not work with this department and cannot give these results. Encouraged pt to follow up with MFM.

## 2021-09-30 ENCOUNTER — Telehealth: Payer: Self-pay | Admitting: Family Medicine

## 2021-09-30 ENCOUNTER — Ambulatory Visit (INDEPENDENT_AMBULATORY_CARE_PROVIDER_SITE_OTHER): Payer: 59

## 2021-09-30 ENCOUNTER — Other Ambulatory Visit: Payer: Self-pay

## 2021-09-30 VITALS — BP 119/75 | HR 82 | Wt 240.9 lb

## 2021-09-30 DIAGNOSIS — O09899 Supervision of other high risk pregnancies, unspecified trimester: Secondary | ICD-10-CM

## 2021-09-30 DIAGNOSIS — O09299 Supervision of pregnancy with other poor reproductive or obstetric history, unspecified trimester: Secondary | ICD-10-CM

## 2021-09-30 DIAGNOSIS — Z641 Problems related to multiparity: Secondary | ICD-10-CM

## 2021-09-30 DIAGNOSIS — O0990 Supervision of high risk pregnancy, unspecified, unspecified trimester: Secondary | ICD-10-CM

## 2021-09-30 DIAGNOSIS — Z8759 Personal history of other complications of pregnancy, childbirth and the puerperium: Secondary | ICD-10-CM

## 2021-09-30 DIAGNOSIS — O219 Vomiting of pregnancy, unspecified: Secondary | ICD-10-CM

## 2021-09-30 MED ORDER — BLOOD PRESSURE KIT DEVI: 1.0000 | 0 refills | Status: AC

## 2021-09-30 MED ORDER — ONDANSETRON 4 MG PO TBDP: 4.0000 mg | ORAL_TABLET | Freq: Four times a day (QID) | ORAL | 0 refills | Status: DC | PRN

## 2021-09-30 MED ORDER — PROMETHAZINE HCL 25 MG PO TABS
25.0000 mg | ORAL_TABLET | Freq: Four times a day (QID) | ORAL | 0 refills | Status: DC | PRN
Start: 2021-09-30 — End: 2021-12-24

## 2021-09-30 NOTE — Progress Notes (Signed)
Pt returned mychart message saying cuff at [place] not covered by Medicare and cannot afford. Advised pt to buy larger cuff from where she got her BP machine at [facility]. Pt agreeable to plan of care.   Shelda Pal

## 2021-09-30 NOTE — Telephone Encounter (Signed)
Patient called in regards to her vomiting green stuff, what to know if that's normal.

## 2021-09-30 NOTE — Telephone Encounter (Signed)
Call placed back to pt. Pt states has been vomiting green mucous in the mornings. Pt denies seeing blood or having other symptoms. Pt states has ran out of anti-emetics. Pt advised normal with morning sickness and sounds like GI bile. Pt advised will refill Zofran and Phenergan to help with nausea and vomiting. Pt agreeable to plan of care. Pt also coming today for BP check. Pt verbalized understanding.  Colletta Maryland, RN

## 2021-09-30 NOTE — Progress Notes (Signed)
Pt here today for BP check. Pt forgot to bring BP cuff and machine from home. Pt denies any headaches, swelling, dizziness or visual changes at this time. Pt states has been eating well and staying hydrated. Pt describes cuff at home as large size. Pt advised to bring to next OB appt next week. Pt verbalized understanding. Pt also wanting to do to [facility] pant[facility] today.  Pt advised will send in new BP cuff and machine with correct size cuff to [facility]. Pt agreeable. Will return call if cannot afford or insurance doesn't cover.  BP: 119/75   FHR: 145.  Colletta Maryland, RN

## 2021-10-03 ENCOUNTER — Encounter: Payer: Self-pay | Admitting: *Deleted

## 2021-10-03 ENCOUNTER — Telehealth: Payer: Self-pay

## 2021-10-03 NOTE — Telephone Encounter (Signed)
Received call from [facility] regarding patient having a medication question. Called patient back. She says she took vagistat over the counter for vaginal itching and wanted to make sure this was safe to take during pregnancy. Advised that this was safe but that she should be seen by a provider for her symptoms to make sure she has no other infection or source of irritation that needs to be treated. She states understanding and will keep her 10/24 appt to discuss this further.

## 2021-10-07 ENCOUNTER — Encounter: Payer: 59 | Admitting: Obstetrics and Gynecology

## 2021-10-07 ENCOUNTER — Other Ambulatory Visit: Payer: Self-pay

## 2021-10-08 ENCOUNTER — Other Ambulatory Visit (HOSPITAL_COMMUNITY)
Admission: RE | Admit: 2021-10-08 | Discharge: 2021-10-08 | Disposition: A | Discharge status: Home / Self Care | Payer: 59 | Source: Ambulatory Visit | Attending: Family Medicine | Admitting: Family Medicine

## 2021-10-08 ENCOUNTER — Ambulatory Visit (INDEPENDENT_AMBULATORY_CARE_PROVIDER_SITE_OTHER): Payer: 59 | Admitting: Family Medicine

## 2021-10-08 VITALS — BP 100/73 | HR 90 | Wt 239.0 lb

## 2021-10-08 DIAGNOSIS — O0990 Supervision of high risk pregnancy, unspecified, unspecified trimester: Secondary | ICD-10-CM

## 2021-10-08 DIAGNOSIS — O09299 Supervision of pregnancy with other poor reproductive or obstetric history, unspecified trimester: Secondary | ICD-10-CM

## 2021-10-08 DIAGNOSIS — O26892 Other specified pregnancy related conditions, second trimester: Secondary | ICD-10-CM | POA: Insufficient documentation

## 2021-10-08 DIAGNOSIS — O283 Abnormal ultrasonic finding on antenatal screening of mother: Secondary | ICD-10-CM

## 2021-10-08 DIAGNOSIS — N898 Other specified noninflammatory disorders of vagina: Secondary | ICD-10-CM

## 2021-10-08 DIAGNOSIS — F3132 Bipolar disorder, current episode depressed, moderate: Secondary | ICD-10-CM

## 2021-10-08 DIAGNOSIS — Z3A21 21 weeks gestation of pregnancy: Secondary | ICD-10-CM

## 2021-10-08 NOTE — Patient Instructions (Addendum)
Keep checking your blood pressure once daily. Goal is to have your blood pressure <130/80, not over 140/90.

## 2021-10-09 ENCOUNTER — Encounter: Payer: Self-pay | Admitting: Family Medicine

## 2021-10-09 ENCOUNTER — Encounter: Payer: Self-pay | Admitting: *Deleted

## 2021-10-09 DIAGNOSIS — O283 Abnormal ultrasonic finding on antenatal screening of mother: Secondary | ICD-10-CM

## 2021-10-09 HISTORY — DX: Abnormal ultrasonic finding on antenatal screening of mother: O28.3

## 2021-10-09 NOTE — Progress Notes (Signed)
    Subjective:  [name] is a 35 y.o. [id number] at [redacted]w[redacted]d being seen today for ongoing prenatal care.  She is currently monitored for the following issues for this high-risk pregnancy and has Borderline behavior; GERD (gastroesophageal reflux disease); Bipolar affective disorder, depressed, moderate degree ([place]); Obesity (BMI 30-39.9); OSA (obstructive sleep apnea); Vaginal venereal warts; Anxiety state; Asthma; Consecutive esotropia; Intellectual disability; ADHD (attention deficit hyperactivity disorder), inattentive type; Nausea and vomiting during pregnancy; Retained products of conception after miscarriage; Supervision of high risk pregnancy, antepartum; History of preterm delivery, currently pregnant; History of pre-eclampsia in prior pregnancy, currently pregnant; History of gestational hypertension; and Grand multipara on their problem list.  Patient reports fatigue.  States she has been checking her BP at home, usually around 321 systolic. However had a 170/140 one night with recheck around 117/80, and another at SBP 140 without recheck. She is very worried about developing pre-eclampsia as she has in previous pregnancies. No HA, blurred vision, RUQ abdominal pain, worsening swelling. Contractions: Not present. Vag. Bleeding: None.  Movement: Absent. Denies leaking of fluid.   The following portions of the patient's history were reviewed and updated as appropriate: allergies, current medications, past family history, past medical history, past social history, past surgical history and problem list.   Objective:   Vitals:   10/08/21 1036  BP: 100/73  Pulse: 90  Weight: 239 lb (108.4 kg)    Fetal Status: Fetal Heart Rate (bpm): 145 Fundal Height: 21 cm Movement: Absent     General:  Alert, oriented and cooperative. Patient is in no acute distress.  Skin: Skin is warm and dry. No rash noted.   Cardiovascular: Normal heart rate noted  Respiratory: Normal respiratory effort, no  problems with respiration noted  Abdomen: Soft, gravid, appropriate for gestational age right above umbilicus. Pain/Pressure: Present     Pelvic:  Cervical exam deferred        Extremities: Normal range of motion.  Edema: None  Mental Status: Normal mood and affect.     Assessment and Plan:  Pregnancy: [id number] at [redacted]w[redacted]d  1. Supervision of high risk pregnancy, antepartum Overall doing well, encouraged adequate rest, hydration, and well balanced diet to help with fatigue.   2. [redacted] weeks gestation of pregnancy  3. Vaginal discharge during pregnancy in second trimester - Cervicovaginal ancillary only( [place])  4. Abnormal antenatal ultrasound Echogenic bowel seen on anatomy U/S and follow-up on 10/10.  CMV/toxo labs reassuring.  Following with MFM every 4 weeks for growth.  She is aware of her upcoming U/S appointment.  5. Bipolar affective disorder, depressed, moderate degree (HCC) On Lexapro, Abilify, and Thorazine per psychiatry.  Feels her symptoms are overall well managed currently.  6. History of pre-eclampsia in prior pregnancy, currently pregnant Provided reassurance, BP very WNL during office visit today.  Quite high BP at home incidentally was likely a false reading.  Encourage continue monitoring BP at home with goal under 140/90.  Preterm labor symptoms and general obstetric precautions including but not limited to vaginal bleeding, contractions, leaking of fluid and fetal movement were reviewed in detail with the patient. Please refer to After Visit Summary for other counseling recommendations.   Return in about 3 weeks (around 10/29/2021) for HROB.   Patriciaann Clan, DO

## 2021-10-14 ENCOUNTER — Ambulatory Visit: Payer: 59 | Attending: Obstetrics

## 2021-10-14 ENCOUNTER — Encounter: Payer: Self-pay | Admitting: *Deleted

## 2021-10-14 ENCOUNTER — Other Ambulatory Visit: Payer: Self-pay

## 2021-10-14 ENCOUNTER — Ambulatory Visit: Payer: 59 | Admitting: *Deleted

## 2021-10-14 ENCOUNTER — Other Ambulatory Visit: Payer: Self-pay | Admitting: Obstetrics

## 2021-10-14 ENCOUNTER — Other Ambulatory Visit: Payer: Self-pay | Admitting: *Deleted

## 2021-10-14 VITALS — BP 110/68 | HR 84

## 2021-10-14 DIAGNOSIS — Z641 Problems related to multiparity: Secondary | ICD-10-CM | POA: Insufficient documentation

## 2021-10-14 DIAGNOSIS — E669 Obesity, unspecified: Secondary | ICD-10-CM

## 2021-10-14 DIAGNOSIS — O09899 Supervision of other high risk pregnancies, unspecified trimester: Secondary | ICD-10-CM

## 2021-10-14 DIAGNOSIS — O0990 Supervision of high risk pregnancy, unspecified, unspecified trimester: Secondary | ICD-10-CM

## 2021-10-14 DIAGNOSIS — Z8759 Personal history of other complications of pregnancy, childbirth and the puerperium: Secondary | ICD-10-CM

## 2021-10-14 DIAGNOSIS — O283 Abnormal ultrasonic finding on antenatal screening of mother: Secondary | ICD-10-CM | POA: Diagnosis present

## 2021-10-14 DIAGNOSIS — O99343 Other mental disorders complicating pregnancy, third trimester: Secondary | ICD-10-CM | POA: Diagnosis not present

## 2021-10-14 DIAGNOSIS — O358XX0 Maternal care for other (suspected) fetal abnormality and damage, not applicable or unspecified: Secondary | ICD-10-CM

## 2021-10-14 DIAGNOSIS — O09293 Supervision of pregnancy with other poor reproductive or obstetric history, third trimester: Secondary | ICD-10-CM

## 2021-10-14 DIAGNOSIS — O35DXX0 Maternal care for other (suspected) fetal abnormality and damage, fetal gastrointestinal anomalies, not applicable or unspecified: Secondary | ICD-10-CM

## 2021-10-14 DIAGNOSIS — O99213 Obesity complicating pregnancy, third trimester: Secondary | ICD-10-CM

## 2021-10-14 DIAGNOSIS — O09299 Supervision of pregnancy with other poor reproductive or obstetric history, unspecified trimester: Secondary | ICD-10-CM | POA: Insufficient documentation

## 2021-10-14 DIAGNOSIS — F419 Anxiety disorder, unspecified: Secondary | ICD-10-CM

## 2021-10-14 DIAGNOSIS — Z3A21 21 weeks gestation of pregnancy: Secondary | ICD-10-CM

## 2021-10-16 ENCOUNTER — Ambulatory Visit: Payer: 59

## 2021-10-16 ENCOUNTER — Ambulatory Visit: Payer: Self-pay

## 2021-10-18 ENCOUNTER — Ambulatory Visit: Payer: 59

## 2021-10-18 ENCOUNTER — Ambulatory Visit: Payer: Self-pay

## 2021-10-22 ENCOUNTER — Telehealth: Payer: Self-pay | Admitting: Family Medicine

## 2021-10-22 LAB — CERVICOVAGINAL ANCILLARY ONLY

## 2021-10-22 NOTE — Telephone Encounter (Signed)
Addressed pt through mychart.  [name]

## 2021-10-22 NOTE — Telephone Encounter (Signed)
Patient requesting to speak to a nurse ASAP, state she lost her mucus plug.

## 2021-10-22 NOTE — Progress Notes (Signed)
Cervicovaginal swab collected 10/08/21 had insufficient quantity. Lab unable to run specimen. Will need recollect at next appt.

## 2021-10-26 ENCOUNTER — Telehealth: Payer: Self-pay | Admitting: Obstetrics and Gynecology

## 2021-10-26 NOTE — Telephone Encounter (Signed)
Babys[facility]s called for blood pressure. Patient is [id number] at [redacted]w[redacted]d.  The blood pressure was 117/96. Pt is asymptomatic. No intervention recommended. Last BP in the office was 100/73.

## 2021-10-30 ENCOUNTER — Telehealth (INDEPENDENT_AMBULATORY_CARE_PROVIDER_SITE_OTHER): Payer: 59 | Admitting: Family Medicine

## 2021-10-30 VITALS — BP 110/91

## 2021-10-30 DIAGNOSIS — Z641 Problems related to multiparity: Secondary | ICD-10-CM

## 2021-10-30 DIAGNOSIS — O0942 Supervision of pregnancy with grand multiparity, second trimester: Secondary | ICD-10-CM

## 2021-10-30 DIAGNOSIS — O0990 Supervision of high risk pregnancy, unspecified, unspecified trimester: Secondary | ICD-10-CM

## 2021-10-30 DIAGNOSIS — O09212 Supervision of pregnancy with history of pre-term labor, second trimester: Secondary | ICD-10-CM

## 2021-10-30 DIAGNOSIS — O09299 Supervision of pregnancy with other poor reproductive or obstetric history, unspecified trimester: Secondary | ICD-10-CM

## 2021-10-30 DIAGNOSIS — O09292 Supervision of pregnancy with other poor reproductive or obstetric history, second trimester: Secondary | ICD-10-CM

## 2021-10-30 DIAGNOSIS — Z8759 Personal history of other complications of pregnancy, childbirth and the puerperium: Secondary | ICD-10-CM

## 2021-10-30 DIAGNOSIS — O09899 Supervision of other high risk pregnancies, unspecified trimester: Secondary | ICD-10-CM

## 2021-10-30 DIAGNOSIS — Z3A24 24 weeks gestation of pregnancy: Secondary | ICD-10-CM

## 2021-10-30 NOTE — Progress Notes (Signed)
OBSTETRICS PRENATAL VIRTUAL VISIT ENCOUNTER NOTE  Provider location: [facility] for [facility]   Patient location: Home  I connected with [name] on 10/30/21 at  2:15 PM EST by MyChart Video Encounter and verified that I am speaking with the correct person using two identifiers. I discussed the limitations, risks, security and privacy concerns of performing an evaluation and management service virtually and the availability of in person appointments. I also discussed with the patient that there may be a patient responsible charge related to this service. The patient expressed understanding and agreed to proceed. Subjective:  [name] is a 35 y.o. [id number] at [redacted]w[redacted]d being seen today for ongoing prenatal care.  She is currently monitored for the following issues for this high-risk pregnancy and has Borderline behavior; GERD (gastroesophageal reflux disease); Bipolar affective disorder, depressed, moderate degree (HCC); Obesity (BMI 30-39.9); OSA (obstructive sleep apnea); Vaginal venereal warts; Anxiety state; Asthma; Consecutive esotropia; Intellectual disability; ADHD (attention deficit hyperactivity disorder), inattentive type; Retained products of conception after miscarriage; Supervision of high risk pregnancy, antepartum; History of preterm delivery, currently pregnant; History of pre-eclampsia in prior pregnancy, currently pregnant; History of gestational hypertension; Grand multipara; and Abnormal antenatal ultrasound on their problem list.  Patient reports  complains of BP being elevated. She is concerned that her BP is high and is anxious that she will have to delivery early .  Contractions: Not present. Vag. Bleeding: None.  Movement: Present. Denies any leaking of fluid.   The following portions of the patient's history were reviewed and updated as appropriate: allergies, current medications, past family history, past medical history, past  social history, past surgical history and problem list.   Objective:   Vitals:   10/30/21 1438  BP: (!) 110/91    Fetal Status:     Movement: Present     General:  Alert, oriented and cooperative. Patient is in no acute distress.  Respiratory: Normal respiratory effort, no problems with respiration noted  Mental Status: Normal mood and affect. Normal behavior. Normal judgment and thought content.  Rest of physical exam deferred due to type of encounter  Imaging: [place] MFM OB FOLLOW UP  Result Date: 10/14/2021 ----------------------------------------------------------------------  OBSTETRICS REPORT                       (Signed Final 10/14/2021 01:15 pm) ---------------------------------------------------------------------- Patient Info  ID #:       [id number]                          D.O.B.:  13-Jan-1986 (35 yrs)  Name:       [name]              Visit Date: 10/14/2021 09:48 am ---------------------------------------------------------------------- Performed By  Attending:        Ma Rings MD         Secondary Phy.:   Marny Lowenstein                                                             PA  Performed By:     Tommie Raymond BS,       Address:          [phone]  [address]                                                             [address] [place],                                                             [place] [address]  Referred By:      [facility]          Location:         [facility]                                [facility] at                                                             [facility]                                                             Women  Ref. Address:     [address]                    [place], [place]                    [address] ---------------------------------------------------------------------- Orders  #  Description                           Code        Ordered By  1  [place] MFM OB FOLLOW UP                    [id number]    Rosana Hoes ----------------------------------------------------------------------  #  Order #                     Accession #                Episode #  1  [id number]                   [id number]                 [id number] ---------------------------------------------------------------------- Indications  Echogenic bowel  O35.8XX0 028.3  [redacted] weeks gestation of pregnancy                Z3A.21  Poor obstetric history: Previous               O09.299  preeclampsia / eclampsia/gestational HTN  Poor obstetric history: Previous preterm       O09.219  delivery, antepartum  Poor obstetric history: Previous gestational   O09.299  diabetes  Short interval between pregancies, 2nd         O09.892  trimester  Other mental disorder complicating             O99.340  pregnancy, second trimester  Asthma                                         O99.89 j45.909  Obesity complicating pregnancy, second         O99.212  trimester (BMI 39)  LR NIPS/ Negative Horizon ---------------------------------------------------------------------- Fetal Evaluation  Num Of Fetuses:         1  Fetal Heart Rate(bpm):  141  Cardiac Activity:       Observed  Presentation:           Variable  Placenta:               Anterior  P. Cord Insertion:      Previously Visualized  Amniotic Fluid  AFI FV:      Within normal limits                              Largest Pocket(cm)                              6 ---------------------------------------------------------------------- Biometry  BPD:        53  mm     G. Age:  22w 1d         57  %    CI:        74.43   %    70 - 86                                                          FL/HC:      17.8   %    18.4 - 20.2  HC:       195   mm     G. Age:  21w 5d         32  %    HC/AC:      1.08        1.06 - 1.25  AC:      179.8  mm     G. Age:  22w 6d         74  %    FL/BPD:     65.5   %    71 - 87  FL:       34.7  mm     G. Age:  21w 0d         14  %    FL/AC:      19.3   %  20 - 24  CER:      23.3  mm     G. Age:  21w 4d         60  %  LV:        6.4  mm  CM:        4.9  mm  Est. FW:     465  gm           1 lb     50  % ---------------------------------------------------------------------- OB History  Blood Type:   O+  Gravidity:    8         Term:   4        Prem:   1        SAB:   2  TOP:          0       Ectopic:  0        Living: 5 ---------------------------------------------------------------------- Gestational Age  LMP:           21w 6d        Date:  05/14/21                 EDD:   02/18/22  U/S Today:     22w 0d                                        EDD:   02/17/22  Best:          21w 6d     Det. By:  LMP  (05/14/21)          EDD:   02/18/22 ---------------------------------------------------------------------- Anatomy  Cranium:               Appears normal         LVOT:                   Appears normal  Cavum:                 Appears normal         Aortic Arch:            Appears normal  Ventricles:            Appears normal         Ductal Arch:            Appears normal  Choroid Plexus:        Previously seen        Diaphragm:              Appears normal  Cerebellum:            Appears normal         Stomach:                Appears normal, left                                                                        sided  Posterior Fossa:       Appears normal         Abdomen:  Echogenic Bowel  Nuchal Fold:           Previously seen        Abdominal Wall:         Previously seen  Face:                  Profile nl; orbits     Cord Vessels:           Previously seen                         previously visualized  Lips:                  Previously seen        Kidneys:                Appear normal  Palate:                Not well visualized    Bladder:                Appears normal  Thoracic:              Appears normal         Spine:                  Appears normal  Heart:                 Appears normal         Upper Extremities:      Previously seen                          (4CH, axis, and                         situs)  RVOT:                  Appears normal         Lower Extremities:      Previously seen  Other:  Fetus appears to be female. Nasal Bone, Heels/feet, open hands/5th          digits, VC, 3VV and 3VTV visualized previously. Technically difficult          due to fetal position. ---------------------------------------------------------------------- Cervix Uterus Adnexa  Cervix  Length:           4.23  cm.  Normal appearance by transabdominal scan.  Uterus  No abnormality visualized.  Right Ovary  Within normal limits.  Left Ovary  Within normal limits.  Cul De Sac  No free fluid seen.  Adnexa  No abnormality visualized. ---------------------------------------------------------------------- Comments  This patient was seen for a follow up growth scan due to  echogenic bowel noted during her prior ultrasound exam.  Her CMV and toxoplasmosis testing did not indicate an acute  infection.  She denies any problems since her last exam.  She was informed that the fetal growth and amniotic fluid  level appears appropriate for her gestational age.  The fetal bowel appeared less echogenic today.  A follow up exam was scheduled in 4 weeks. ----------------------------------------------------------------------                   Ma Rings, MD Electronically Signed Final Report   10/14/2021 01:15 pm ----------------------------------------------------------------------   Assessment and Plan:  Pregnancy: [id number] at [redacted]w[redacted]d 1. Supervision of high risk pregnancy, antepartum  Good fetal movement  2. History of preterm delivery, currently pregnant  3. History of pre-eclampsia in prior pregnancy, currently pregnant WIll continue to monitor BP  4. History of gestational hypertension  5. Grand multipara   Preterm labor symptoms and general obstetric precautions including but not limited to vaginal bleeding, contractions, leaking of fluid and fetal movement were reviewed in  detail with the patient. I discussed the assessment and treatment plan with the patient. The patient was provided an opportunity to ask questions and all were answered. The patient agreed with the plan and demonstrated an understanding of the instructions. The patient was advised to call back or seek an in-person office evaluation/go to [facility] at [facility] for any urgent or concerning symptoms. Please refer to After Visit Summary for other counseling recommendations.   I provided 15 minutes of face-to-face time during this encounter.  No follow-ups on file.  Future Appointments  Date Time Provider Department Center  11/11/2021  3:30 PM [facility] NURSE [facility]  11/11/2021  3:45 PM [facility] US4 [facility]    Levie Heritage, DO Center for [employer], [facility]

## 2021-11-11 ENCOUNTER — Other Ambulatory Visit: Payer: Self-pay

## 2021-11-11 ENCOUNTER — Ambulatory Visit: Payer: 59 | Attending: Obstetrics | Admitting: *Deleted

## 2021-11-11 ENCOUNTER — Other Ambulatory Visit: Payer: Self-pay | Admitting: *Deleted

## 2021-11-11 ENCOUNTER — Ambulatory Visit: Payer: 59

## 2021-11-11 ENCOUNTER — Telehealth: Payer: Self-pay

## 2021-11-11 ENCOUNTER — Ambulatory Visit (HOSPITAL_BASED_OUTPATIENT_CLINIC_OR_DEPARTMENT_OTHER): Payer: 59

## 2021-11-11 VITALS — BP 122/70 | HR 90

## 2021-11-11 DIAGNOSIS — O35DXX0 Maternal care for other (suspected) fetal abnormality and damage, fetal gastrointestinal anomalies, not applicable or unspecified: Secondary | ICD-10-CM | POA: Diagnosis not present

## 2021-11-11 DIAGNOSIS — Z3A25 25 weeks gestation of pregnancy: Secondary | ICD-10-CM | POA: Insufficient documentation

## 2021-11-11 DIAGNOSIS — E669 Obesity, unspecified: Secondary | ICD-10-CM | POA: Diagnosis not present

## 2021-11-11 DIAGNOSIS — O09299 Supervision of pregnancy with other poor reproductive or obstetric history, unspecified trimester: Secondary | ICD-10-CM

## 2021-11-11 DIAGNOSIS — O09899 Supervision of other high risk pregnancies, unspecified trimester: Secondary | ICD-10-CM

## 2021-11-11 DIAGNOSIS — O09892 Supervision of other high risk pregnancies, second trimester: Secondary | ICD-10-CM

## 2021-11-11 DIAGNOSIS — O99212 Obesity complicating pregnancy, second trimester: Secondary | ICD-10-CM | POA: Insufficient documentation

## 2021-11-11 DIAGNOSIS — Z8759 Personal history of other complications of pregnancy, childbirth and the puerperium: Secondary | ICD-10-CM

## 2021-11-11 DIAGNOSIS — Z6839 Body mass index (BMI) 39.0-39.9, adult: Secondary | ICD-10-CM

## 2021-11-11 DIAGNOSIS — J45909 Unspecified asthma, uncomplicated: Secondary | ICD-10-CM | POA: Insufficient documentation

## 2021-11-11 DIAGNOSIS — O09292 Supervision of pregnancy with other poor reproductive or obstetric history, second trimester: Secondary | ICD-10-CM | POA: Diagnosis not present

## 2021-11-11 DIAGNOSIS — Z641 Problems related to multiparity: Secondary | ICD-10-CM

## 2021-11-11 DIAGNOSIS — Z362 Encounter for other antenatal screening follow-up: Secondary | ICD-10-CM | POA: Diagnosis present

## 2021-11-11 DIAGNOSIS — O99512 Diseases of the respiratory system complicating pregnancy, second trimester: Secondary | ICD-10-CM | POA: Insufficient documentation

## 2021-11-11 DIAGNOSIS — O0990 Supervision of high risk pregnancy, unspecified, unspecified trimester: Secondary | ICD-10-CM

## 2021-11-11 NOTE — Telephone Encounter (Signed)
Received call from [facility] on 11/08/21 @ 10:59a alerting that Pt's BP was elevated, read: 151/119 with Shortness of breath.Pt has Nurse visit today at 12:30p. Called Pt to advise, had her to take BP today, it was 138/92.the patient is very concerned of being Induced. Advised that will be determined by Physician. Pt verbalized understanding.

## 2021-11-18 ENCOUNTER — Encounter: Payer: Self-pay | Admitting: Obstetrics and Gynecology

## 2021-11-20 ENCOUNTER — Encounter: Payer: Self-pay | Admitting: Obstetrics and Gynecology

## 2021-11-20 ENCOUNTER — Other Ambulatory Visit: Payer: Self-pay

## 2021-11-20 DIAGNOSIS — N898 Other specified noninflammatory disorders of vagina: Secondary | ICD-10-CM

## 2021-11-20 MED ORDER — TERCONAZOLE 0.4 % VA CREA: 1.0000 | TOPICAL_CREAM | Freq: Every day | VAGINAL | 0 refills | Status: DC

## 2021-11-22 ENCOUNTER — Telehealth: Payer: Self-pay

## 2021-11-22 NOTE — Telephone Encounter (Signed)
VM left on nurse line by [facility] representative stating OB Panel with HIV and A1C. Called representative. New diagnosis code given for A1C: Obesity Dx: E66.9. Explained OB Panel is associated with correct diagnosis of O09.90. Urine Culture is also outstanding, charge is $7. Representative states they will resubmit for payment through insurance, both Medicare and Medicaid, this should take 4-6 weeks to process.

## 2021-11-25 ENCOUNTER — Other Ambulatory Visit: Payer: Self-pay | Admitting: *Deleted

## 2021-11-25 DIAGNOSIS — O0990 Supervision of high risk pregnancy, unspecified, unspecified trimester: Secondary | ICD-10-CM

## 2021-11-26 ENCOUNTER — Encounter: Payer: Self-pay | Admitting: Obstetrics and Gynecology

## 2021-11-26 ENCOUNTER — Other Ambulatory Visit: Payer: Self-pay

## 2021-11-26 ENCOUNTER — Other Ambulatory Visit: Payer: 59

## 2021-11-26 ENCOUNTER — Ambulatory Visit (INDEPENDENT_AMBULATORY_CARE_PROVIDER_SITE_OTHER): Payer: 59 | Admitting: Family Medicine

## 2021-11-26 ENCOUNTER — Telehealth: Payer: Self-pay | Admitting: Lactation Services

## 2021-11-26 ENCOUNTER — Encounter: Payer: Self-pay | Admitting: Family Medicine

## 2021-11-26 VITALS — BP 114/84 | HR 115 | Wt 250.7 lb

## 2021-11-26 DIAGNOSIS — O09299 Supervision of pregnancy with other poor reproductive or obstetric history, unspecified trimester: Secondary | ICD-10-CM

## 2021-11-26 DIAGNOSIS — O0990 Supervision of high risk pregnancy, unspecified, unspecified trimester: Secondary | ICD-10-CM

## 2021-11-26 DIAGNOSIS — O09899 Supervision of other high risk pregnancies, unspecified trimester: Secondary | ICD-10-CM

## 2021-11-26 DIAGNOSIS — Z641 Problems related to multiparity: Secondary | ICD-10-CM

## 2021-11-26 DIAGNOSIS — O283 Abnormal ultrasonic finding on antenatal screening of mother: Secondary | ICD-10-CM

## 2021-11-26 DIAGNOSIS — F3132 Bipolar disorder, current episode depressed, moderate: Secondary | ICD-10-CM

## 2021-11-26 MED ORDER — FAMOTIDINE 20 MG PO TABS: 20.0000 mg | ORAL_TABLET | Freq: Two times a day (BID) | ORAL | 5 refills | Status: DC | PRN

## 2021-11-26 MED ORDER — CYCLOBENZAPRINE HCL 5 MG PO TABS: 5.0000 mg | ORAL_TABLET | Freq: Three times a day (TID) | ORAL | 1 refills | Status: DC | PRN

## 2021-11-26 NOTE — Telephone Encounter (Signed)
Patient called and reports she was in the office today. She is asking if the BP she checked earlier was 103/100 (she coughed during checking) and repeat was 117/82. Reviewed that initial BP was not accurate and 2nd one is normal.   Reviewed sitting for 15 minutes with feet on floor before checking and not talking while taking. She reports she has brought her BP cuff to the office and it was noted to be similar to in office BP check.   Patient voiced understanding to above.

## 2021-11-26 NOTE — Progress Notes (Signed)
   Subjective:  [name] is a 35 y.o. [id number] at [redacted]w[redacted]d being seen today for ongoing prenatal care.  She is currently monitored for the following issues for this high-risk pregnancy and has Borderline behavior; GERD (gastroesophageal reflux disease); Bipolar affective disorder, depressed, moderate degree ([place]); Obesity (BMI 30-39.9); OSA (obstructive sleep apnea); Vaginal venereal warts; Anxiety state; Asthma; Consecutive esotropia; Intellectual disability; ADHD (attention deficit hyperactivity disorder), inattentive type; Retained products of conception after miscarriage; Supervision of high risk pregnancy, antepartum; History of preterm delivery, currently pregnant; History of pre-eclampsia in prior pregnancy, currently pregnant; History of gestational hypertension; Grand multipara; and Abnormal antenatal ultrasound on their problem list.  Patient reports no complaints.  Contractions: Not present. Vag. Bleeding: None.  Movement: Present. Denies leaking of fluid.   The following portions of the patient's history were reviewed and updated as appropriate: allergies, current medications, past family history, past medical history, past social history, past surgical history and problem list. Problem list updated.  Objective:   Vitals:   11/26/21 0926  BP: 114/84  Pulse: (!) 115  Weight: 250 lb 11.2 oz (113.7 kg)    Fetal Status: Fetal Heart Rate (bpm): 139   Movement: Present     General:  Alert, oriented and cooperative. Patient is in no acute distress.  Skin: Skin is warm and dry. No rash noted.   Cardiovascular: Normal heart rate noted  Respiratory: Normal respiratory effort, no problems with respiration noted  Abdomen: Soft, gravid, appropriate for gestational age. Pain/Pressure: Present     Pelvic: Vag. Bleeding: None     Cervical exam deferred        Extremities: Normal range of motion.  Edema: Trace  Mental Status: Normal mood and affect. Normal behavior. Normal judgment and  thought content.   Urinalysis:      Assessment and Plan:  Pregnancy: [id number] at [redacted]w[redacted]d  1. Supervision of high risk pregnancy, antepartum BP and FHR normal 28 wk labs today Tdap accepted Would like BTL, papers signed today  2. Bipolar affective disorder, depressed, moderate degree (HCC) On Abilify, thorazine  3. History of preterm delivery, currently pregnant [redacted] weeks, iatrogenic  4. History of pre-eclampsia in prior pregnancy, currently pregnant Not on ASA On review of BP's had 128/92 at [redacted]w[redacted]d Had a 110/91 from HOME cuff on [redacted]w[redacted]d Very focused on being induced at 37 weeks Discussed that at present she does not meet criteria for IOL but based on prior pregnancies its highly likely she will be induced at that time  5. [place] multipara NSVD x5  6. Abnormal antenatal ultrasound Echogenic bowel seen on initial [place], resolved on most recent scan from 11/11/21  Preterm labor symptoms and general obstetric precautions including but not limited to vaginal bleeding, contractions, leaking of fluid and fetal movement were reviewed in detail with the patient. Please refer to After Visit Summary for other counseling recommendations.  Return in about 2 weeks (around 12/10/2021) for [facility], ob visit, needs MD.   Clarnce Flock, MD

## 2021-11-27 LAB — CBC
Hematocrit: 35.6 % (ref 34.0–46.6)
Hemoglobin: 12 g/dL (ref 11.1–15.9)
MCH: 30.1 pg (ref 26.6–33.0)
MCHC: 33.7 g/dL (ref 31.5–35.7)
MCV: 89 fL (ref 79–97)
Platelets: 166 10*3/uL (ref 150–450)
RBC: 3.99 x10E6/uL (ref 3.77–5.28)
RDW: 13.5 % (ref 11.7–15.4)
WBC: 6.2 10*3/uL (ref 3.4–10.8)

## 2021-11-27 LAB — RPR: RPR Ser Ql: NONREACTIVE

## 2021-11-27 LAB — HIV ANTIBODY (ROUTINE TESTING W REFLEX): HIV Screen 4th Generation wRfx: NONREACTIVE

## 2021-11-27 LAB — GLUCOSE TOLERANCE, 2 HOURS W/ 1HR
Glucose, 1 hour: 116 mg/dL (ref 70–179)
Glucose, 2 hour: 84 mg/dL (ref 70–152)
Glucose, Fasting: 75 mg/dL (ref 70–91)

## 2021-11-28 ENCOUNTER — Telehealth: Payer: 59 | Admitting: Physician Assistant

## 2021-11-28 NOTE — Progress Notes (Signed)
Spoke with patient who is not having flu-like symptoms. She is having ongoing wrist pain and numbness that occurs at night time. Is currently pregnant. Seems carpal tunnel-related but not something we can adequately evaluate via this avenue. She was instructed to follow-up with her OB who she is seeing soon. UC/ER precautions reviewed.

## 2021-11-28 NOTE — Progress Notes (Signed)
Message sent to patient requesting further input regarding current symptoms. Awaiting patient response.  

## 2021-12-04 ENCOUNTER — Telehealth: Payer: Self-pay | Admitting: Family Medicine

## 2021-12-04 NOTE — Telephone Encounter (Signed)
Call placed back to pt.  Spoke with pt. Pt states having concerns about elevated BP. Pt had BP and sent to babyscripts for 130/97 and states having "higher ones" like 140/90s. Pt denies headaches, swelling or visual changes at this time.   Advised pt to retake BP while on the phone.  Pt took BP and results of 133/97. pt just moved to kitchen area away from children in the background before taking BP. Pt advised this is ok for now and to continue to monitor symptoms and take BP once weekly or with symptoms. Pt also worried about when will see be induced. Pt advised can talk with provider at next visit about these concerns. Next OB appt 1/5 at 415 pm with Dr Rip Harbour. Pt agreeable to plan of care.  Colletta Maryland, RN

## 2021-12-04 NOTE — Telephone Encounter (Signed)
Patient state her BP has been high today, 140/ mid 90's requesting a nurse call her her back ASAP.

## 2021-12-05 ENCOUNTER — Encounter: Payer: Self-pay | Admitting: Obstetrics and Gynecology

## 2021-12-05 NOTE — Telephone Encounter (Signed)
Notified by baby scripts of elevated home BP reading of 124/97.   Per review of chart issue has already been discussed and management plan reviewed with patient earlier today.  No further action.

## 2021-12-06 ENCOUNTER — Telehealth: Payer: Self-pay | Admitting: General Practice

## 2021-12-06 NOTE — Telephone Encounter (Signed)
Received phone call from [employer]s regarding an elevated BP of 115/95 with a headache.   Called patient and she states she has had a headache this morning and that's why she checked her BP. She states she was sitting down for a while before checking her blood pressure. Reports taking 1000mg  of tylenol around 1030 which has helped minimally. She is drinking about 3-4 bottles of water a day. Patient reports headaches quite frequently in this pregnancy and her 2nd to last pregnancy. Discussed she may want to try a little caffeine as well to see if that helps and she can have more tylenol if she needs it around 430. Discussed reported blood pressure doesn't seem accurate as numbers were too close together. Reviewed process of checking blood pressures with patient and she is aware. Patient is concerned about developing pre-eclampsia again. Discussed with patient she could certainly develop it again and around this time but usually we see it closer to the end of pregnancy around 35-36 weeks. Patient asked about induction and when that would be and when could we schedule it. Told patient we don't have that information right now and likely wouldn't until February when she is closer to her due date. Offered BP check appt in office next week and patient denies due to transportation concerns. Patient had no other questions at this time and is aware how to reach [place] with any concerns.

## 2021-12-12 ENCOUNTER — Telehealth: Payer: Self-pay

## 2021-12-12 NOTE — Telephone Encounter (Signed)
Patient calls in stating she is having trouble sleeping and would like to speak to clinical staff regarding this issue

## 2021-12-12 NOTE — Telephone Encounter (Signed)
I reviewed [name]'s request for sleep medicine and her current medications and medical history with Dr. Kennon Rounds. She may take otc meds per protocol as needed: Tylenol PM, Unisom( no gelcaps), Benadryl 25 mg alcohol free.  I called Areatha to inform her and reached her voicemail. I left a message I am returning her call and will send a detailed MyChart message, please message or call back if questions. Emberli Ballester,RN

## 2021-12-15 NOTE — L&D Delivery Note (Signed)
OB/GYN Faculty Practice Delivery Note  [name] is a 36 y.o. [id number] s/p SVD at [redacted]w[redacted]d. She was admitted for IOL due to gHTN and DFM.   ROM: 3h 61m with clear/brown tinged fluid GBS Status: Negative   Delivery Date/Time: 01/28/22 at 1156  Delivery: Called to room and patient was complete and pushing. Head delivered LOA. No nuchal cord present. Shoulder and body delivered in usual fashion. Infant with spontaneous cry, placed on mother's abdomen, dried and stimulated. Cord clamped x 2 after 1-minute delay and cut. Cord blood drawn. Placenta delivered spontaneously with gentle cord traction. Fundus firm with massage and Pitocin. Labia, perineum, vagina, and cervix were inspected, and patient was found to have a 1st degree perineal laceration that was repaired with 3-0 Vicryl and found to be hemostatic.   Placenta: Intact, 3VC - sent to L&D Complications: None Lacerations: 1st degree perineal  EBL: 450 cc Analgesia: Epidural   Infant: Viable female   APGARs 9 and 66   Vilma Meckel, MD OB/GYN Fellow, Faculty Practice

## 2021-12-17 ENCOUNTER — Encounter: Payer: Self-pay | Admitting: Obstetrics and Gynecology

## 2021-12-18 ENCOUNTER — Telehealth: Payer: 59 | Admitting: Obstetrics and Gynecology

## 2021-12-19 ENCOUNTER — Encounter: Payer: Self-pay | Admitting: Obstetrics and Gynecology

## 2021-12-19 ENCOUNTER — Telehealth (INDEPENDENT_AMBULATORY_CARE_PROVIDER_SITE_OTHER): Payer: 59 | Admitting: Obstetrics and Gynecology

## 2021-12-19 ENCOUNTER — Other Ambulatory Visit: Payer: Self-pay

## 2021-12-19 ENCOUNTER — Inpatient Hospital Stay (HOSPITAL_COMMUNITY)
Admission: AD | Admit: 2021-12-19 | Discharge: 2021-12-19 | Discharge status: Against Medical Advice | Payer: 59 | Attending: Obstetrics and Gynecology | Admitting: Obstetrics and Gynecology

## 2021-12-19 VITALS — BP 110/97 | HR 88

## 2021-12-19 DIAGNOSIS — Z5329 Procedure and treatment not carried out because of patient's decision for other reasons: Secondary | ICD-10-CM | POA: Insufficient documentation

## 2021-12-19 DIAGNOSIS — Z3009 Encounter for other general counseling and advice on contraception: Secondary | ICD-10-CM | POA: Insufficient documentation

## 2021-12-19 DIAGNOSIS — O09893 Supervision of other high risk pregnancies, third trimester: Secondary | ICD-10-CM

## 2021-12-19 DIAGNOSIS — O09293 Supervision of pregnancy with other poor reproductive or obstetric history, third trimester: Secondary | ICD-10-CM

## 2021-12-19 DIAGNOSIS — Z641 Problems related to multiparity: Secondary | ICD-10-CM

## 2021-12-19 DIAGNOSIS — Z3A31 31 weeks gestation of pregnancy: Secondary | ICD-10-CM

## 2021-12-19 DIAGNOSIS — O0990 Supervision of high risk pregnancy, unspecified, unspecified trimester: Secondary | ICD-10-CM

## 2021-12-19 DIAGNOSIS — O9853 Other viral diseases complicating the puerperium: Secondary | ICD-10-CM

## 2021-12-19 DIAGNOSIS — O09899 Supervision of other high risk pregnancies, unspecified trimester: Secondary | ICD-10-CM

## 2021-12-19 DIAGNOSIS — U071 COVID-19: Secondary | ICD-10-CM

## 2021-12-19 DIAGNOSIS — O98513 Other viral diseases complicating pregnancy, third trimester: Secondary | ICD-10-CM

## 2021-12-19 DIAGNOSIS — O09299 Supervision of pregnancy with other poor reproductive or obstetric history, unspecified trimester: Secondary | ICD-10-CM

## 2021-12-19 DIAGNOSIS — Z8759 Personal history of other complications of pregnancy, childbirth and the puerperium: Secondary | ICD-10-CM

## 2021-12-19 HISTORY — DX: COVID-19: U07.1

## 2021-12-19 HISTORY — DX: Other viral diseases complicating the puerperium: O98.53

## 2021-12-19 MED ORDER — ALBUTEROL SULFATE HFA 108 (90 BASE) MCG/ACT IN AERS: 2.0000 | INHALATION_SPRAY | Freq: Four times a day (QID) | RESPIRATORY_TRACT | 2 refills | Status: DC | PRN

## 2021-12-19 MED ORDER — PAXLOVID (300/100) 20 X 150 MG & 10 X 100MG PO TBPK: ORAL_TABLET | ORAL | 0 refills | Status: DC

## 2021-12-19 NOTE — Progress Notes (Signed)
TELEHEALTH OBSTETRICS VISIT ENCOUNTER NOTE  Provider location: [facility] [place] [facility]   Patient location: Home  I connected with [name] on 12/19/21 at  4:15 PM EST by telephone at home and verified that I am speaking with the correct person using two identifiers. Of note, unable to do video encounter [name] to technical difficulties.    I discussed the limitations, risks, security and privacy concerns of performing an evaluation and management service by telephone and the availability of in person appointments. I also discussed with the patient that there may be a patient responsible charge related to this service. The patient expressed understanding and agreed to proceed.  Subjective:  [name] is a 36 y.o. [id number] at [redacted]w[redacted]d being followed for ongoing prenatal care.  She is currently monitored for the following issues for this high-risk pregnancy and has Borderline behavior; GERD (gastroesophageal reflux disease); Bipolar affective disorder, depressed, moderate degree ([place]); Obesity (BMI 30-39.9); OSA (obstructive sleep apnea); Vaginal venereal warts; Anxiety state; Asthma; Consecutive esotropia; Intellectual disability; ADHD (attention deficit hyperactivity disorder), inattentive type; Retained products of conception after miscarriage; Supervision of high risk pregnancy, antepartum; History of preterm delivery, currently pregnant; History of pre-eclampsia in prior pregnancy, currently pregnant; History of gestational hypertension; [place] multipara; Abnormal antenatal ultrasound; and COVID-19 affecting puerperium on their problem list.  Patient reports Dx with Covid 1/3. Reports fetal movement. Denies any contractions, bleeding or leaking of fluid.   The following portions of the patient's history were reviewed and updated as appropriate: allergies, current medications, past family history, past medical history, past social history, past surgical  history and problem list.   Objective:  Blood pressure (!) 110/97, pulse 88, last menstrual period 05/14/2021, not currently breastfeeding. General:  Alert, oriented and cooperative.   Mental Status: Normal mood and affect perceived. Normal judgment and thought content.  Rest of physical exam deferred [name] to type of encounter  Assessment and Plan:  Pregnancy: [id number] at [redacted]w[redacted]d 1. Supervision of high risk pregnancy, antepartum Stable Growth scan next week  2. History of preterm delivery, currently pregnant Stable  3. History of pre-eclampsia in prior pregnancy, currently pregnant Stable  4. History of gestational hypertension Stable  5. [place] multipara Stable  6. COVID-19 affecting puerperium Sx treatment reviewed with pt - nirmatrelvir & ritonavir (PAXLOVID, 300/100,) 20 x 150 MG & 10 x 100MG  TBPK; Take 3 tablets bid x 5 days as directed  Dispense: 30 tablet; Refill: 0 - albuterol (VENTOLIN HFA) 108 (90 Base) MCG/ACT inhaler; Inhale 2 puffs into the lungs every 6 (six) hours as needed for wheezing or shortness of breath.  Dispense: 8 g; Refill: 2  Unwanted fertility  BTL papers signed 11/26/21 Preterm labor symptoms and general obstetric precautions including but not limited to vaginal bleeding, contractions, leaking of fluid and fetal movement were reviewed in detail with the patient.  I discussed the assessment and treatment plan with the patient. The patient was provided an opportunity to ask questions and all were answered. The patient agreed with the plan and demonstrated an understanding of the instructions. The patient was advised to call back or seek an in-person office evaluation/go to MAU at [facility] for any urgent or concerning symptoms. Please refer to After Visit Summary for other counseling recommendations.   I provided 8 minutes of non-face-to-face time during this encounter.  No follow-ups on file.  Future Appointments  Date Time Provider  [place]  12/25/2021 11:00 AM [facility]  NURSE [facility]  12/25/2021 11:15 AM [facility] US2 [facility] [place]    Chancy Milroy, MD [facility] [place] [place]

## 2021-12-19 NOTE — Progress Notes (Signed)
Pt ambulated off unit in stable condition prior to being seen by a MAU provider.

## 2021-12-19 NOTE — Progress Notes (Addendum)
Pt states "I can't breathe" and refuses to answer any further questions.  02 Sats 97% on room air.  States she has an appointment later today and is leaving.  Asked if she wants to be seen by provider @ hospital, states no, "I'll be seen later, I have a virtual visit.".  Pt requesting EFM and BP cuff be removed.  Pt made phone call, informed caller she needed to be picked up, she then proceeded to get OOB and get dressed.

## 2021-12-19 NOTE — MAU Note (Addendum)
Presents with c/o SOB and Covid symptoms that began last night.  States has cough, congestion, sore throat and loss of taste & smell.  Reports +Covid home test.

## 2021-12-21 ENCOUNTER — Telehealth: Payer: Self-pay | Admitting: Obstetrics and Gynecology

## 2021-12-21 NOTE — Progress Notes (Signed)
Chart reviewed for nurse visit. Agree with plan of care.   Starr Lake, CNM 12/21/2021 3:24 AM

## 2021-12-21 NOTE — Telephone Encounter (Signed)
OB Telephone note [facility] for a 117/90 BP. I called Ms. Wieseler at  [phone] and had her re-check it and it was 135/92, HR 98. She has no s/s of pre-eclampsia and she states her covid test today was negative.   I told her to rest, take a nap, eat dinner and then check her BP and if still 140 or higher for the top or 90 or higher for the bottom to come to the MAU for pre-eclampsia eval. If normal, I told her to keep checking it daily. Pt amenable to plan  Durene Romans MD Attending [facility] [place] (Faculty Practice) 12/21/2021 Time: [phone]

## 2021-12-22 ENCOUNTER — Telehealth: Payer: 59 | Admitting: Emergency Medicine

## 2021-12-22 DIAGNOSIS — Z3A32 32 weeks gestation of pregnancy: Secondary | ICD-10-CM

## 2021-12-22 DIAGNOSIS — N898 Other specified noninflammatory disorders of vagina: Secondary | ICD-10-CM | POA: Diagnosis not present

## 2021-12-22 MED ORDER — CLOTRIMAZOLE 1 % VA CREA: 1.0000 | TOPICAL_CREAM | Freq: Every day | VAGINAL | 1 refills | Status: AC

## 2021-12-22 NOTE — Patient Instructions (Signed)
[employer], thank you for joining [place], PA-C for today's virtual visit.  While this provider is not your primary care provider (PCP), if your PCP is located in our provider database this encounter information will be shared with them immediately following your visit.  Consent: (Patient) [name] provided verbal consent for this virtual visit at the beginning of the encounter.  Current Medications:  Current Outpatient Medications:    clotrimazole (GYNE-LOTRIMIN) 1 % vaginal cream, Place 1 Applicatorful vaginally at bedtime for 7 days., Disp: 45 g, Rfl: 1   albuterol (VENTOLIN HFA) 108 (90 Base) MCG/ACT inhaler, Inhale 2 puffs into the lungs every 6 (six) hours as needed for wheezing or shortness of breath., Disp: 8 g, Rfl: 2   ARIPiprazole (ABILIFY) 30 MG tablet, Take 30 mg by mouth daily., Disp: , Rfl:    Blood Pressure Monitoring (BLOOD PRESSURE KIT) DEVI, 1 Device by Does not apply route once a week., Disp: 1 each, Rfl: 0   chlorproMAZINE (THORAZINE) 25 MG tablet, Take 25 mg by mouth at bedtime. (Patient not taking: Reported on 12/19/2021), Disp: , Rfl:    cyclobenzaprine (FLEXERIL) 5 MG tablet, Take 1 tablet (5 mg total) by mouth every 8 (eight) hours as needed for muscle spasms. (Patient not taking: Reported on 12/19/2021), Disp: 30 tablet, Rfl: 1   escitalopram (LEXAPRO) 5 MG tablet, Take 5 mg by mouth daily. (Patient not taking: Reported on 10/14/2021), Disp: , Rfl:    famotidine (PEPCID) 20 MG tablet, Take 1 tablet (20 mg total) by mouth 2 (two) times daily as needed for heartburn or indigestion., Disp: 60 tablet, Rfl: 5   nirmatrelvir & ritonavir (PAXLOVID, 300/100,) 20 x 150 MG & 10 x 100MG TBPK, Take 3 tablets bid x 5 days as directed, Disp: 30 tablet, Rfl: 0   ondansetron (ZOFRAN ODT) 4 MG disintegrating tablet, Take 1 tablet (4 mg total) by mouth every 6 (six) hours as needed for nausea. (Patient not taking: Reported on 12/19/2021), Disp: 30 tablet, Rfl: 0    Prenatal Vit-Fe Fumarate-FA (PRENATAL MULTIVITAMIN) TABS tablet, Take 1 tablet by mouth daily at 12 noon., Disp: , Rfl:    promethazine (PHENERGAN) 25 MG tablet, Take 1 tablet (25 mg total) by mouth every 6 (six) hours as needed for nausea or vomiting., Disp: 30 tablet, Rfl: 0   Medications ordered in this encounter:  Meds ordered this encounter  Medications   clotrimazole (GYNE-LOTRIMIN) 1 % vaginal cream    Sig: Place 1 Applicatorful vaginally at bedtime for 7 days.    Dispense:  45 g    Refill:  1    Order Specific Question:   Supervising Provider    Answer:   Sabra Heck, [place]     *If you need refills on other medications prior to your next appointment, please contact your pharmacy*  Follow-Up: Call back or seek an in-person evaluation if the symptoms worsen or if the condition fails to improve as anticipated.  Other Instructions Clotrimazole cream prescribed.  Use as directed and to completion for possible yeast infection Follow up with OB for recheck and to ensure symptoms are improving Call 911 or go to the ER if you have fever, watery discharge, vaginal bleeding, abdominal or back cramps, etc...   If you have been instructed to have an in-person evaluation today at a local Urgent Care facility, please use the link below. It will take you to a list of all of our available [facility], including address,  phone number and hours of operation. Please do not delay care.  [facility]  If you or a family member do not have a primary care provider, use the link below to schedule a visit and establish care. When you choose a [facility] primary care physician or advanced practice provider, you gain a long-term partner in health. Find a Primary Care Provider  Learn more about [facility]'s in-office and virtual care options: [facility]

## 2021-12-22 NOTE — Progress Notes (Signed)
Virtual Visit Consent   [name], you are scheduled for a virtual visit with a [facility] provider today.     Just as with appointments in the office, your consent must be obtained to participate.  Your consent will be active for this visit and any virtual visit you may have with one of our providers in the next 365 days.     If you have a MyChart account, a copy of this consent can be sent to you electronically.  All virtual visits are billed to your insurance company just like a traditional visit in the office.    As this is a virtual visit, video technology does not allow for your provider to perform a traditional examination.  This may limit your provider's ability to fully assess your condition.  If your provider identifies any concerns that need to be evaluated in person or the need to arrange testing (such as labs, EKG, etc.), we will make arrangements to do so.     Although advances in technology are sophisticated, we cannot ensure that it will always work on either your end or our end.  If the connection with a video visit is poor, the visit may have to be switched to a telephone visit.  With either a video or telephone visit, we are not always able to ensure that we have a secure connection.     I need to obtain your verbal consent now.   Are you willing to proceed with your visit today? Yes   LIVIANNA PETRAGLIA has provided verbal consent on 12/22/2021 for a virtual visit (video or telephone).   Lestine Box, [place]   Date: 12/22/2021 2:26 PM   Virtual Visit via Video Note   I, Lestine Box, connected with  JASARA CORRIGAN  ([id number], [birth year]) on 12/22/21 at  2:15 PM EST by a video-enabled telemedicine application and verified that I am speaking with the correct person using two identifiers.  Location: Patient: Virtual Visit Location Patient: Home Provider: Virtual Visit Location Provider: Home Office   I discussed the limitations of evaluation and management  by telemedicine and the availability of in person appointments. The patient expressed understanding and agreed to proceed.    History of Present Illness: [name] is a 36 y.o. who identifies as a female who was assigned female at birth, and is being seen today for vaginal itching and discharge (clear to white discharge) x 3 days.  Patient is pregnant.  She has NOT tried OTC medications.  She reports worsening symptoms with scratching.  She reports similar symptoms in the past and was diagnosed with yeast infection.  She denies fever, chills, nausea, vomiting, vaginal odor, vaginal bleeding, abdominal pain, cramping, brown/ green discharge, dyspareunia, vaginal lesions/ sores.   HPI: HPI  Problems:  Patient Active Problem List   Diagnosis Date Noted   COVID-19 affecting puerperium 12/19/2021   Unwanted fertility 12/19/2021   Abnormal antenatal ultrasound 10/09/2021   Supervision of high risk pregnancy, antepartum 07/03/2021   History of preterm delivery, currently pregnant 07/03/2021   History of pre-eclampsia in prior pregnancy, currently pregnant 07/03/2021   History of gestational hypertension 07/03/2021   [place] multipara 07/03/2021   ADHD (attention deficit hyperactivity disorder), 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   Vaginal venereal warts 04/24/2015   Anxiety state 03/27/2014   OSA (obstructive sleep apnea) 09/15/2013   Asthma 05/21/2013   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   Intellectual disability 09/15/2012   Obesity (BMI 30-39.9) 08/03/2012   Bipolar affective  disorder, depressed, moderate degree ([place]) 06/02/2012    Class: Acute   GERD (gastroesophageal reflux disease) 05/19/2012   Borderline behavior 04/12/2012    Class: Acute    Allergies:  Allergies  Allergen Reactions   Depakote [Divalproex Sodium] Other (See Comments)    Pt states that this medication makes her blood levels toxic.     Methylphenidate Derivatives Other (See Comments)    Reaction:  Depression and  anger    Neurontin [Gabapentin] Other (See Comments)    Reaction:  Dizziness    Prozac [Fluoxetine Hcl] Other (See Comments)    Reaction:  Anger    Methylphenidate Hcl     SAME AS PREVIOUS LISTED METHYLPHENIDATE   Medications:  Current Outpatient Medications:    clotrimazole (GYNE-LOTRIMIN) 1 % vaginal cream, Place 1 Applicatorful vaginally at bedtime for 7 days., Disp: 45 g, Rfl: 1   albuterol (VENTOLIN HFA) 108 (90 Base) MCG/ACT inhaler, Inhale 2 puffs into the lungs every 6 (six) hours as needed for wheezing or shortness of breath., Disp: 8 g, Rfl: 2   ARIPiprazole (ABILIFY) 30 MG tablet, Take 30 mg by mouth daily., Disp: , Rfl:    Blood Pressure Monitoring (BLOOD PRESSURE KIT) DEVI, 1 Device by Does not apply route once a week., Disp: 1 each, Rfl: 0   chlorproMAZINE (THORAZINE) 25 MG tablet, Take 25 mg by mouth at bedtime. (Patient not taking: Reported on 12/19/2021), Disp: , Rfl:    cyclobenzaprine (FLEXERIL) 5 MG tablet, Take 1 tablet (5 mg total) by mouth every 8 (eight) hours as needed for muscle spasms. (Patient not taking: Reported on 12/19/2021), Disp: 30 tablet, Rfl: 1   escitalopram (LEXAPRO) 5 MG tablet, Take 5 mg by mouth daily. (Patient not taking: Reported on 10/14/2021), Disp: , Rfl:    famotidine (PEPCID) 20 MG tablet, Take 1 tablet (20 mg total) by mouth 2 (two) times daily as needed for heartburn or indigestion., Disp: 60 tablet, Rfl: 5   nirmatrelvir & ritonavir (PAXLOVID, 300/100,) 20 x 150 MG & 10 x 100MG TBPK, Take 3 tablets bid x 5 days as directed, Disp: 30 tablet, Rfl: 0   ondansetron (ZOFRAN ODT) 4 MG disintegrating tablet, Take 1 tablet (4 mg total) by mouth every 6 (six) hours as needed for nausea. (Patient not taking: Reported on 12/19/2021), Disp: 30 tablet, Rfl: 0   Prenatal Vit-Fe Fumarate-FA (PRENATAL MULTIVITAMIN) TABS tablet, Take 1 tablet by mouth daily at 12 noon., Disp: , Rfl:    promethazine (PHENERGAN) 25 MG tablet, Take 1 tablet (25 mg total) by mouth every  6 (six) hours as needed for nausea or vomiting., Disp: 30 tablet, Rfl: 0  Observations/Objective: Patient is well-developed, well-nourished in no acute distress.  Resting comfortably at home.  Head is normocephalic, atraumatic.  No labored breathing. Speaking in full sentences and tolerating own secretions Speech is clear and coherent with logical content.  Patient is alert and oriented at baseline.    Assessment and Plan: 1. [redacted] weeks gestation of pregnancy  2. Vaginal discharge  3. Vaginal itching  Clotrimazole cream prescribed.  Use as directed and to completion for possible yeast infection Follow up with OB for recheck and to ensure symptoms are improving Call 911 or go to the ER if you have fever, watery discharge, vaginal bleeding, abdominal or back cramps, etc...  Follow Up Instructions: I discussed the assessment and treatment plan with the patient. The patient was provided an opportunity to ask questions and all were answered. The patient agreed with  the plan and demonstrated an understanding of the instructions.  A copy of instructions were sent to the patient via MyChart unless otherwise noted below.    The patient was advised to call back or seek an in-person evaluation if the symptoms worsen or if the condition fails to improve as anticipated.  Time:  I spent 5-10 minutes with the patient via telehealth technology discussing the above problems/concerns.    Lestine Box, PA-C

## 2021-12-24 ENCOUNTER — Other Ambulatory Visit: Payer: Self-pay

## 2021-12-24 ENCOUNTER — Ambulatory Visit: Payer: 59 | Admitting: *Deleted

## 2021-12-24 ENCOUNTER — Ambulatory Visit: Payer: 59 | Attending: Obstetrics and Gynecology

## 2021-12-24 VITALS — BP 113/80 | HR 111

## 2021-12-24 DIAGNOSIS — O09299 Supervision of pregnancy with other poor reproductive or obstetric history, unspecified trimester: Secondary | ICD-10-CM | POA: Insufficient documentation

## 2021-12-24 DIAGNOSIS — O09892 Supervision of other high risk pregnancies, second trimester: Secondary | ICD-10-CM | POA: Diagnosis present

## 2021-12-24 DIAGNOSIS — O0990 Supervision of high risk pregnancy, unspecified, unspecified trimester: Secondary | ICD-10-CM | POA: Insufficient documentation

## 2021-12-24 DIAGNOSIS — Z3A32 32 weeks gestation of pregnancy: Secondary | ICD-10-CM | POA: Diagnosis not present

## 2021-12-24 DIAGNOSIS — Z6839 Body mass index (BMI) 39.0-39.9, adult: Secondary | ICD-10-CM

## 2021-12-24 DIAGNOSIS — O09899 Supervision of other high risk pregnancies, unspecified trimester: Secondary | ICD-10-CM | POA: Diagnosis present

## 2021-12-24 DIAGNOSIS — Z8759 Personal history of other complications of pregnancy, childbirth and the puerperium: Secondary | ICD-10-CM | POA: Diagnosis present

## 2021-12-24 DIAGNOSIS — Z641 Problems related to multiparity: Secondary | ICD-10-CM

## 2021-12-24 DIAGNOSIS — O35DXX0 Maternal care for other (suspected) fetal abnormality and damage, fetal gastrointestinal anomalies, not applicable or unspecified: Secondary | ICD-10-CM

## 2021-12-25 ENCOUNTER — Ambulatory Visit: Payer: 59

## 2021-12-25 ENCOUNTER — Encounter: Payer: Self-pay | Admitting: Obstetrics and Gynecology

## 2021-12-25 ENCOUNTER — Other Ambulatory Visit: Payer: Self-pay | Admitting: *Deleted

## 2021-12-25 DIAGNOSIS — O409XX0 Polyhydramnios, unspecified trimester, not applicable or unspecified: Secondary | ICD-10-CM

## 2021-12-28 ENCOUNTER — Telehealth: Payer: Self-pay | Admitting: Obstetrics and Gynecology

## 2021-12-28 NOTE — Telephone Encounter (Signed)
Called patient after [employer]s called with BP 140/60.   She has a h/o PreE. She is allergic to ASA.   Phone call went straight to voicemail. Left voicemail to recheck the blood pressure. If elevated would recommend evaluation in MAU or if she developed persistent symptoms of HA/BV/RUQ pain that did not go away with usual measures.   Radene Gunning, MD 4:40 PM 12/28/21

## 2021-12-29 ENCOUNTER — Telehealth: Payer: Self-pay | Admitting: Radiology

## 2021-12-29 ENCOUNTER — Telehealth: Payer: Self-pay | Admitting: Obstetrics & Gynecology

## 2021-12-29 DIAGNOSIS — O163 Unspecified maternal hypertension, third trimester: Secondary | ICD-10-CM

## 2021-12-29 DIAGNOSIS — O09299 Supervision of pregnancy with other poor reproductive or obstetric history, unspecified trimester: Secondary | ICD-10-CM

## 2021-12-29 NOTE — Telephone Encounter (Signed)
° ° ° °  Faculty Practice OB/GYN Physician Phone Call Documentation  I had a phone conversation with [facility] about [facility] with elevated BP 148/85.    I called patient at home. She reported having a repeat BP of 143/70 and had a mild headache that resolved with Tylenol. Patient denies visual symptoms, RUQ/epigastric pain or other concerning symptoms. Reports +good FM, no VB, no contractions or LOF.  Of note, patient had elevated BP yesterday too, was called but was unable to be reached and a voicemail message was left for her.  She has a history of preeclampsia.  Given her persistent BP and her history, she was told to come to MAU now for preeclampsia evaluation. Recommended labs, NST and further BP monitoring.  Patient declined to come in today, she was then told she will need to be seen in office tomorrow for repeat BP check, labs, possible NST.  PEC and FM precautions reviewed; was strongly urged to come to MAU for worsening BP, recurrence of headache or other symptoms or any fetal concerns.   MAU staff notified that this patient may come in for evaluation. Message also sent to [facility] staff to add patient on for BP check, labs, possible NST as a RN or provider visit tomorrow 12/30/21.    Verita Schneiders, MD, [place] for [employer], [place]

## 2021-12-29 NOTE — Telephone Encounter (Signed)
Spoke with patient, informed of nurse visit scheduled 12/30/21. Patient stated that appointment time and date worked for her.

## 2021-12-30 ENCOUNTER — Encounter: Payer: Self-pay | Admitting: Obstetrics and Gynecology

## 2021-12-30 ENCOUNTER — Ambulatory Visit (INDEPENDENT_AMBULATORY_CARE_PROVIDER_SITE_OTHER): Payer: 59 | Admitting: *Deleted

## 2021-12-30 ENCOUNTER — Other Ambulatory Visit: Payer: Self-pay | Admitting: Family Medicine

## 2021-12-30 ENCOUNTER — Other Ambulatory Visit: Payer: Self-pay

## 2021-12-30 VITALS — BP 104/63 | HR 130 | Ht 63.0 in | Wt 249.3 lb

## 2021-12-30 DIAGNOSIS — Z8759 Personal history of other complications of pregnancy, childbirth and the puerperium: Secondary | ICD-10-CM

## 2021-12-30 DIAGNOSIS — O0990 Supervision of high risk pregnancy, unspecified, unspecified trimester: Secondary | ICD-10-CM

## 2021-12-30 DIAGNOSIS — Z641 Problems related to multiparity: Secondary | ICD-10-CM

## 2021-12-30 DIAGNOSIS — O368130 Decreased fetal movements, third trimester, not applicable or unspecified: Secondary | ICD-10-CM

## 2021-12-30 DIAGNOSIS — O09899 Supervision of other high risk pregnancies, unspecified trimester: Secondary | ICD-10-CM

## 2021-12-30 DIAGNOSIS — O09299 Supervision of pregnancy with other poor reproductive or obstetric history, unspecified trimester: Secondary | ICD-10-CM

## 2021-12-30 LAB — COMPREHENSIVE METABOLIC PANEL
ALT: 5 IU/L (ref 0–32)
AST: 9 IU/L (ref 0–40)
Albumin/Globulin Ratio: 1.5 (ref 1.2–2.2)
Albumin: 3.7 g/dL — ABNORMAL LOW (ref 3.8–4.8)
Alkaline Phosphatase: 144 IU/L — ABNORMAL HIGH (ref 44–121)
BUN/Creatinine Ratio: 11 (ref 9–23)
BUN: 7 mg/dL (ref 6–20)
Bilirubin Total: 0.3 mg/dL (ref 0.0–1.2)
CO2: 20 mmol/L (ref 20–29)
Calcium: 8.8 mg/dL (ref 8.7–10.2)
Chloride: 104 mmol/L (ref 96–106)
Creatinine, Ser: 0.64 mg/dL (ref 0.57–1.00)
Globulin, Total: 2.4 g/dL (ref 1.5–4.5)
Glucose: 97 mg/dL (ref 70–99)
Potassium: 4 mmol/L (ref 3.5–5.2)
Sodium: 136 mmol/L (ref 134–144)
Total Protein: 6.1 g/dL (ref 6.0–8.5)
eGFR: 119 mL/min/{1.73_m2} (ref >59)

## 2021-12-30 LAB — CBC
Hematocrit: 35 % (ref 34.0–46.6)
Hemoglobin: 12 g/dL (ref 11.1–15.9)
MCH: 28.3 pg (ref 26.6–33.0)
MCHC: 34.3 g/dL (ref 31.5–35.7)
MCV: 83 fL (ref 79–97)
Platelets: 193 10*3/uL (ref 150–450)
RBC: 4.24 x10E6/uL (ref 3.77–5.28)
RDW: 15.9 % — ABNORMAL HIGH (ref 11.7–15.4)
WBC: 8.1 10*3/uL (ref 3.4–10.8)

## 2021-12-30 NOTE — Progress Notes (Signed)
Stat CBC and CMET results received and reviewed with Dr. Damita Dunnings. Assessment reveiwed with Dr. Damita Dunnings. Instructed to keep next ob scheduled appointment as scheduled , no new orders. I called [name] and informed her labs looked good and did not indicate pre-eclampsia. I advised her to keep her next scheduled ob visit on 01/07/22 as scheduled. I advised her if she gets a severe headache unrelieved by tylenol or high bp at home to go to [facility] MAU for evaluation. I encouraged her to let us know if she has questions before her  next visit. She wanted to know if she will be delivered at 37 weeks. I informed her it was in her chart as a definite plan but as a possibility. I explained her providers will change plan as needed and do what is best for her and baby. She would like [name] to call her and I informed her he is not in today but I would send him a message. She voices understanding of plan. Jahmir Salo,RN

## 2021-12-30 NOTE — Progress Notes (Signed)
Patient came to office and states she is supposed to get check for preeclampsia. Per Dr. Harolyn Rutherford note preferred patient go to MAU but patient refuses, she also told me today she does not want to go to the hospital. Will get BP, labs. BP elevated at 126/91, repeat 137/88. Patient reports headache =4. States baby moving less since last [place]. Discussed with Diane Day , RN and she will complete NST. Informed Dr. Ernestina Patches patient here and NST being done.  Nichole Vanantwerp,RN

## 2021-12-31 ENCOUNTER — Encounter: Payer: Self-pay | Admitting: Family Medicine

## 2021-12-31 LAB — PROTEIN / CREATININE RATIO, URINE
Creatinine, Urine: 195.9 mg/dL
Protein, Ur: 45.1 mg/dL
Protein/Creat Ratio: 230 mg/g creat — ABNORMAL HIGH (ref 0–200)

## 2022-01-03 NOTE — Progress Notes (Signed)
Attestation of Attending Supervision of clinical support staff: I agree with the care provided to this patient and was available for any consultation.  I have reviewed the RN's note and chart. I was available for consult and to see the patient if needed.   Rozetta Stumpp MD MPH Attending Physician Faculty Practice- [facility]  

## 2022-01-06 ENCOUNTER — Encounter: Payer: 59 | Admitting: Obstetrics & Gynecology

## 2022-01-06 ENCOUNTER — Encounter: Payer: Self-pay | Admitting: *Deleted

## 2022-01-07 ENCOUNTER — Encounter (HOSPITAL_COMMUNITY): Payer: Self-pay | Admitting: Obstetrics and Gynecology

## 2022-01-07 ENCOUNTER — Ambulatory Visit (INDEPENDENT_AMBULATORY_CARE_PROVIDER_SITE_OTHER): Payer: 59 | Admitting: Family Medicine

## 2022-01-07 ENCOUNTER — Telehealth: Payer: Self-pay

## 2022-01-07 ENCOUNTER — Inpatient Hospital Stay (HOSPITAL_COMMUNITY)
Admission: AD | Admit: 2022-01-07 | Discharge: 2022-01-07 | Discharge status: Against Medical Advice | Payer: 59 | Attending: Obstetrics and Gynecology | Admitting: Obstetrics and Gynecology

## 2022-01-07 ENCOUNTER — Encounter: Payer: Self-pay | Admitting: Family Medicine

## 2022-01-07 ENCOUNTER — Other Ambulatory Visit: Payer: Self-pay

## 2022-01-07 ENCOUNTER — Inpatient Hospital Stay (HOSPITAL_BASED_OUTPATIENT_CLINIC_OR_DEPARTMENT_OTHER): Payer: 59

## 2022-01-07 VITALS — BP 138/90 | HR 144 | Wt 251.2 lb

## 2022-01-07 DIAGNOSIS — Z3A34 34 weeks gestation of pregnancy: Secondary | ICD-10-CM | POA: Diagnosis not present

## 2022-01-07 DIAGNOSIS — O133 Gestational [pregnancy-induced] hypertension without significant proteinuria, third trimester: Secondary | ICD-10-CM

## 2022-01-07 DIAGNOSIS — Z5329 Procedure and treatment not carried out because of patient's decision for other reasons: Secondary | ICD-10-CM | POA: Insufficient documentation

## 2022-01-07 DIAGNOSIS — O139 Gestational [pregnancy-induced] hypertension without significant proteinuria, unspecified trimester: Secondary | ICD-10-CM | POA: Diagnosis not present

## 2022-01-07 DIAGNOSIS — O09899 Supervision of other high risk pregnancies, unspecified trimester: Secondary | ICD-10-CM

## 2022-01-07 DIAGNOSIS — O403XX0 Polyhydramnios, third trimester, not applicable or unspecified: Secondary | ICD-10-CM

## 2022-01-07 DIAGNOSIS — Z3009 Encounter for other general counseling and advice on contraception: Secondary | ICD-10-CM

## 2022-01-07 DIAGNOSIS — Z641 Problems related to multiparity: Secondary | ICD-10-CM

## 2022-01-07 DIAGNOSIS — O0990 Supervision of high risk pregnancy, unspecified, unspecified trimester: Secondary | ICD-10-CM

## 2022-01-07 DIAGNOSIS — F3132 Bipolar disorder, current episode depressed, moderate: Secondary | ICD-10-CM

## 2022-01-07 DIAGNOSIS — O26893 Other specified pregnancy related conditions, third trimester: Secondary | ICD-10-CM | POA: Diagnosis not present

## 2022-01-07 DIAGNOSIS — O09299 Supervision of pregnancy with other poor reproductive or obstetric history, unspecified trimester: Secondary | ICD-10-CM

## 2022-01-07 DIAGNOSIS — R519 Headache, unspecified: Secondary | ICD-10-CM

## 2022-01-07 DIAGNOSIS — F79 Unspecified intellectual disabilities: Secondary | ICD-10-CM

## 2022-01-07 DIAGNOSIS — R030 Elevated blood-pressure reading, without diagnosis of hypertension: Secondary | ICD-10-CM | POA: Diagnosis present

## 2022-01-07 DIAGNOSIS — Z3689 Encounter for other specified antenatal screening: Secondary | ICD-10-CM

## 2022-01-07 LAB — COMPREHENSIVE METABOLIC PANEL
ALT: 9 U/L (ref 0–44)
AST: 17 U/L (ref 15–41)
Albumin: 2.8 g/dL — ABNORMAL LOW (ref 3.5–5.0)
Alkaline Phosphatase: 106 U/L (ref 38–126)
Anion gap: 8 (ref 5–15)
BUN: <5 mg/dL — ABNORMAL LOW (ref 6–20)
CO2: 25 mmol/L (ref 22–32)
Calcium: 8.8 mg/dL — ABNORMAL LOW (ref 8.9–10.3)
Chloride: 103 mmol/L (ref 98–111)
Creatinine, Ser: 0.76 mg/dL (ref 0.44–1.00)
GFR, Estimated: >60 mL/min (ref >60)
Glucose, Bld: 108 mg/dL — ABNORMAL HIGH (ref 70–99)
Potassium: 4.8 mmol/L (ref 3.5–5.1)
Sodium: 136 mmol/L (ref 135–145)
Total Bilirubin: 0.8 mg/dL (ref 0.3–1.2)
Total Protein: 6.1 g/dL — ABNORMAL LOW (ref 6.5–8.1)

## 2022-01-07 LAB — PROTEIN / CREATININE RATIO, URINE
Creatinine, Urine: 477.52 mg/dL
Protein Creatinine Ratio: 0.21 mg/mg{Cre} — ABNORMAL HIGH (ref 0.00–0.15)
Total Protein, Urine: 100 mg/dL

## 2022-01-07 LAB — CBC
HCT: 35.4 % — ABNORMAL LOW (ref 36.0–46.0)
Hemoglobin: 11.7 g/dL — ABNORMAL LOW (ref 12.0–15.0)
MCH: 29.3 pg (ref 26.0–34.0)
MCHC: 33.1 g/dL (ref 30.0–36.0)
MCV: 88.5 fL (ref 80.0–100.0)
Platelets: 195 10*3/uL (ref 150–400)
RBC: 4 MIL/uL (ref 3.87–5.11)
RDW: 13.9 % (ref 11.5–15.5)
WBC: 9.2 10*3/uL (ref 4.0–10.5)
nRBC: 0 % (ref 0.0–0.2)

## 2022-01-07 MED ORDER — BUTALBITAL-APAP-CAFFEINE 50-325-40 MG PO TABS: 2.0000 | ORAL_TABLET | Freq: Once | ORAL | Status: DC

## 2022-01-07 NOTE — MAU Provider Note (Signed)
History     CSN: [id number]  Arrival date and time: 01/07/22 1528   Event Date/Time   First Provider Initiated Contact with Patient 01/07/22 1626      Chief Complaint  Patient presents with   Hypertension   36 y.o. [id number] @34 .0 wks sent from office for elevated BP. She was dx with gHTN 1 week ago. Reports generalized HA since this am. She took Tylenol but it didn't help. Rates 7/10. Denies visual disturbances, RUQ pain, SOB, and CP. Reports good FM. States she's been having ctx over the last few days. Needs BPP for ante testing d/t unavailable at office.      OB History     Gravida  8   Para  5   Term  4   Preterm  1   AB  2   Living  5      SAB  2   IAB      Ectopic      Multiple  0   Live Births  5           Past Medical History:  Diagnosis Date   Anxiety    Asthma    inhaler PRN-hasn't used inhaler in months   Attention deficit hyperactivity disorder    Bipolar disorder [facility])    hospitalized as a teen for suicidal ideations and cutting   Depression    no complaints, doing well   Gastritis    GERD (gastroesophageal reflux disease)    History of gestational diabetes in prior pregnancy, currently pregnant 10/13/2017   History of preterm delivery after IOL for preeclampsia, currently pregnant 10/13/2017   Not a 17P candidate   Hx of varicella    Mild preeclampsia 02/09/2016   Nausea and vomiting during pregnancy 10/04/2019   Vaginal Pap smear, abnormal     Past Surgical History:  Procedure Laterality Date   DILATION AND EVACUATION N/A 01/08/2021   Procedure: DILATATION AND EVACUATION;  Surgeon: Chancy Milroy, MD;  Location: [place];  Service: Gynecology;  Laterality: N/A;   EYE MUSCLE SURGERY Bilateral    07/29/12   MOUTH SURGERY     PLANTAR FASCIA SURGERY     WISDOM TOOTH EXTRACTION      Family History  Problem Relation Age of Onset   Depression Mother    Colon cancer Mother    Colon polyps Mother     Depression Father    Lung cancer Father    Depression Brother    Liver disease Other    Kidney disease Other     Social History   Tobacco Use   Smoking status: Never   Smokeless tobacco: Never  Vaping Use   Vaping Use: Never used  Substance Use Topics   Alcohol use: No    Alcohol/week: 0.0 standard drinks   Drug use: No    Allergies:  Allergies  Allergen Reactions   Depakote [Divalproex Sodium] Other (See Comments)    Pt states that this medication makes her blood levels toxic.     Methylphenidate Derivatives Other (See Comments)    Reaction:  Depression and anger    Neurontin [Gabapentin] Other (See Comments)    Reaction:  Dizziness    Prozac [Fluoxetine Hcl] Other (See Comments)    Reaction:  Anger    Methylphenidate Hcl     SAME AS PREVIOUS LISTED METHYLPHENIDATE    No medications prior to admission.    Review of Systems  Eyes:  Negative for visual disturbance.  Gastrointestinal:  Negative for abdominal pain.  Genitourinary:  Negative for vaginal bleeding.  Physical Exam   Blood pressure (!) 135/92, pulse (!) 105, last menstrual period 05/14/2021, not currently breastfeeding. Patient Vitals for the past 24 hrs:  BP Pulse  01/07/22 1630 (!) 135/92 (!) 105  01/07/22 1613 (!) 143/115 (!) 105  01/07/22 1604 132/84 (!) 121   Physical Exam Vitals and nursing note reviewed. Exam conducted with a chaperone present.  Constitutional:      General: She is not in acute distress.    Appearance: Normal appearance.  HENT:     Head: Normocephalic and atraumatic.  Pulmonary:     Effort: Pulmonary effort is normal. No respiratory distress.  Genitourinary:    Comments: VE: FT/thick Musculoskeletal:        General: Normal range of motion.     Cervical back: Normal range of motion.  Skin:    General: Skin is warm and dry.  Neurological:     General: No focal deficit present.     Mental Status: She is alert and oriented to person, place, and time.  Psychiatric:         Mood and Affect: Mood normal.        Behavior: Behavior normal.  EFM: 135 bpm, mod variability, + accels, no decels Toco: none  Results for orders placed or performed during the hospital encounter of 01/07/22 (from the past 24 hour(s))  CBC     Status: Abnormal   Collection Time: 01/07/22  4:08 PM  Result Value Ref Range   WBC 9.2 4.0 - 10.5 K/uL   RBC 4.00 3.87 - 5.11 MIL/uL   Hemoglobin 11.7 (L) 12.0 - 15.0 g/dL   HCT 35.4 (L) 36.0 - 46.0 %   MCV 88.5 80.0 - 100.0 fL   MCH 29.3 26.0 - 34.0 pg   MCHC 33.1 30.0 - 36.0 g/dL   RDW 13.9 11.5 - 15.5 %   Platelets 195 150 - 400 K/uL   nRBC 0.0 0.0 - 0.2 %   *Note: Due to a large number of results and/or encounters for the requested time period, some results have not been displayed. A complete set of results can be found in Results Review.   MAU Course  Procedures  MDM Labs and BPP ordered. BPP 8/8 per [place] tech.  1720: pt left AMA d/t childcare issues.  Assessment and Plan  [redacted] weeks gestation Reactive NST Gestational HTN Headache in pregnancy Left AMA    Julianne Handler 01/07/2022, 5:43 PM

## 2022-01-07 NOTE — Patient Instructions (Signed)
Contraception Choices Contraception, also called birth control, refers to methods or devices that prevent pregnancy. Hormonal methods Contraceptive implant A contraceptive implant is a thin, plastic tube that contains a hormone that prevents pregnancy. It is different from an intrauterine device (IUD). It is inserted into the upper part of the arm by a health care provider. Implants can be effective for up to 3 years. Progestin-only injections Progestin-only injections are injections of progestin, a synthetic form of the hormone progesterone. They are given every 3 months by a health care provider. Birth control pills Birth control pills are pills that contain hormones that prevent pregnancy. They must be taken once a day, preferably at the same time each day. A prescription is needed to use this method of contraception. Birth control patch The birth control patch contains hormones that prevent pregnancy. It is placed on the skin and must be changed once a week for three weeks and removed on the fourth week. A prescription is needed to use this method of contraception. Vaginal ring A vaginal ring contains hormones that prevent pregnancy. It is placed in the vagina for three weeks and removed on the fourth week. After that, the process is repeated with a new ring. A prescription is needed to use this method of contraception. Emergency contraceptive Emergency contraceptives prevent pregnancy after unprotected sex. They come in pill form and can be taken up to 5 days after sex. They work best the sooner they are taken after having sex. Most emergency contraceptives are available without a prescription. This method should not be used as your only form of birth control. Barrier methods Female condom A female condom is a thin sheath that is worn over the penis during sex. Condoms keep sperm from going inside a woman's body. They can be used with a sperm-killing substance (spermicide) to increase their  effectiveness. They should be thrown away after one use. Female condom A female condom is a soft, loose-fitting sheath that is put into the vagina before sex. The condom keeps sperm from going inside a woman's body. They should be thrown away after one use. Diaphragm A diaphragm is a soft, dome-shaped barrier. It is inserted into the vagina before sex, along with a spermicide. The diaphragm blocks sperm from entering the uterus, and the spermicide kills sperm. A diaphragm should be left in the vagina for 6-8 hours after sex and removed within 24 hours. A diaphragm is prescribed and fitted by a health care provider. A diaphragm should be replaced every 1-2 years, after giving birth, after gaining more than 15 lb (6.8 kg), and after pelvic surgery. Cervical cap A cervical cap is a round, soft latex or plastic cup that fits over the cervix. It is inserted into the vagina before sex, along with spermicide. It blocks sperm from entering the uterus. The cap should be left in place for 6-8 hours after sex and removed within 48 hours. A cervical cap must be prescribed and fitted by a health care provider. It should be replaced every 2 years. Sponge A sponge is a soft, circular piece of polyurethane foam with spermicide in it. The sponge helps block sperm from entering the uterus, and the spermicide kills sperm. To use it, you make it wet and then insert it into the vagina. It should be inserted before sex, left in for at least 6 hours after sex, and removed and thrown away within 30 hours. Spermicides Spermicides are chemicals that kill or block sperm from entering the cervix and uterus.   They can come as a cream, jelly, suppository, foam, or tablet. A spermicide should be inserted into the vagina with an applicator at least 10-15 minutes before sex to allow time for it to work. The process must be repeated every time you have sex. Spermicides do not require a prescription. Intrauterine  contraception Intrauterine device (IUD) An IUD is a T-shaped device that is put in a woman's uterus. There are two types: Hormone IUD.This type contains progestin, a synthetic form of the hormone progesterone. This type can stay in place for 3-5 years. Copper IUD.This type is wrapped in copper wire. It can stay in place for 10 years. Permanent methods of contraception Female tubal ligation In this method, a woman's fallopian tubes are sealed, tied, or blocked during surgery to prevent eggs from traveling to the uterus. Hysteroscopic sterilization In this method, a small, flexible insert is placed into each fallopian tube. The inserts cause scar tissue to form in the fallopian tubes and block them, so sperm cannot reach an egg. The procedure takes about 3 months to be effective. Another form of birth control must be used during those 3 months. Female sterilization This is a procedure to tie off the tubes that carry sperm (vasectomy). After the procedure, the man can still ejaculate fluid (semen). Another form of birth control must be used for 3 months after the procedure. Natural planning methods Natural family planning In this method, a couple does not have sex on days when the woman could become pregnant. Calendar method In this method, the woman keeps track of the length of each menstrual cycle, identifies the days when pregnancy can happen, and does not have sex on those days. Ovulation method In this method, a couple avoids sex during ovulation. Symptothermal method This method involves not having sex during ovulation. The woman typically checks for ovulation by watching changes in her temperature and in the consistency of cervical mucus. Post-ovulation method In this method, a couple waits to have sex until after ovulation. Where to find more information Centers for Disease Control and Prevention: www.cdc.gov Summary Contraception, also called birth control, refers to methods or devices  that prevent pregnancy. Hormonal methods of contraception include implants, injections, pills, patches, vaginal rings, and emergency contraceptives. Barrier methods of contraception can include female condoms, female condoms, diaphragms, cervical caps, sponges, and spermicides. There are two types of IUDs (intrauterine devices). An IUD can be put in a woman's uterus to prevent pregnancy for 3-5 years. Permanent sterilization can be done through a procedure for males and females. Natural family planning methods involve nothaving sex on days when the woman could become pregnant. This information is not intended to replace advice given to you by your health care provider. Make sure you discuss any questions you have with your health care provider. Document Revised: 05/07/2020 Document Reviewed: 05/07/2020 Elsevier Patient Education  2022 Elsevier Inc.  

## 2022-01-07 NOTE — Telephone Encounter (Signed)
Patient called in and wanted to reschedule her follow-up ultrasound to this week. Spoke with Dr. Donalee Citrin and he said she is okay to do it in 3 weeks. Rescheduled to 01/14/22.

## 2022-01-07 NOTE — MAU Note (Signed)
Sent from office for elevated b/p. C/o headache and some lightheadedness. No visual changes. Good fetal movement felt.

## 2022-01-07 NOTE — Telephone Encounter (Signed)
Patient requested a phone call via MyChart message. Called pt. Pt asks if I am able to see 2 elevated blood pressures from prior visits in the office. I explained there is a single elevated BP on 12/30/21 but repeat was normal. Pt asks about BP recorded in early January. I explained this was documented as elevated but was taken at home during a virtual visit. Pt asks if home blood pressures can be used to diagnose hypertension. Explained patient should review with provider at appt today. Pt asks if elevated blood pressure from 12/30/21 will count for diagnosis. I reiterated patient should speak with provider about this at appt. Explained the repeat on that day was normal and shows that patient's BP was well controlled on that day. Pt asks if she will be induced at 39 weeks if not at 37 weeks, states MFM wrote this in their note. Reviewed MFM recommendation of IOL at 39 weeks due to mild polyhydramnios. Explained provider today will review recommendation with patient and they will collaborate to make a plan.

## 2022-01-07 NOTE — Progress Notes (Signed)
° °  Subjective:  [name] is a 36 y.o. [id number] at [redacted]w[redacted]d being seen today for ongoing prenatal care.  She is currently monitored for the following issues for this high-risk pregnancy and has Borderline behavior; GERD (gastroesophageal reflux disease); Bipolar affective disorder, depressed, moderate degree ([place]); Obesity (BMI 30-39.9); OSA (obstructive sleep apnea); Vaginal venereal warts; Anxiety state; Asthma; Consecutive esotropia; Intellectual disability; ADHD (attention deficit hyperactivity disorder), inattentive type; Gestational hypertension; Supervision of high risk pregnancy, antepartum; History of preterm delivery, currently pregnant; History of pre-eclampsia in prior pregnancy, currently pregnant; History of gestational hypertension; Ovando multipara; Abnormal antenatal ultrasound; COVID-19 affecting puerperium; and Unwanted fertility on their problem list.  Patient reports no complaints.  Contractions: Not present. Vag. Bleeding: None.  Movement: Present. Denies leaking of fluid.   The following portions of the patient's history were reviewed and updated as appropriate: allergies, current medications, past family history, past medical history, past social history, past surgical history and problem list. Problem list updated.  Objective:   Vitals:   01/07/22 1324 01/07/22 1335  BP: 123/84 138/90  Pulse: (!) 144   Weight: 251 lb 3.2 oz (113.9 kg)     Fetal Status: Fetal Heart Rate (bpm): 123   Movement: Present     General:  Alert, oriented and cooperative. Patient is in no acute distress.  Skin: Skin is warm and dry. No rash noted.   Cardiovascular: Normal heart rate noted  Respiratory: Normal respiratory effort, no problems with respiration noted  Abdomen: Soft, gravid, appropriate for gestational age. Pain/Pressure: Present     Pelvic: Vag. Bleeding: None     Cervical exam deferred        Extremities: Normal range of motion.  Edema: None  Mental Status: Normal mood and  affect. Normal behavior. Normal judgment and thought content.   Urinalysis:      Assessment and Plan:  Pregnancy: [id number] at [redacted]w[redacted]d  1. Supervision of high risk pregnancy, antepartum BP mild range, see gHTN below FHR normal  2. History of preterm delivery, currently pregnant [redacted] wks, iatrogenic  3. History of pre-eclampsia in prior pregnancy, currently pregnant Not on ASA  4. Grand multipara NSVD x5  5. Unwanted fertility BTL papers signed 11/26/2021  6. Intellectual disability   7. Bipolar affective disorder, depressed, moderate degree ([place]) Switched to lamictal  8. Gestational hypertension, third trimester Finally meets criteria with second elevated pressure today past 20 weeks Multiple prior abnormals in chart are on home cuff and had previously been consistently normotensive on in person visits Reports headache at present and with hx of PreE in prior pregnancies I am sending patient to MAU for PreE evaluation Patient insists on going home first to let husband know what is happening and for him to take her to MAU (she was brought by [employer]) I discussed this was not my medical advice, she understands this is not recommended Signout given to MAU provider Will also need BPP while there for new diagnosis IOL scheduled for 37 weeks at patient's insistence Form faxed, orders placed  Preterm labor symptoms and general obstetric precautions including but not limited to vaginal bleeding, contractions, leaking of fluid and fetal movement were reviewed in detail with the patient. Please refer to After Visit Summary for other counseling recommendations.  Return in 2 weeks (on 01/21/2022) for [facility], ob visit, needs MD.   Clarnce Flock, MD

## 2022-01-07 NOTE — MAU Note (Signed)
Pt needing to go pick up her kids. [name] AMA signed. Notified Provider.

## 2022-01-08 ENCOUNTER — Encounter: Payer: Self-pay | Admitting: Family Medicine

## 2022-01-08 ENCOUNTER — Encounter: Payer: Self-pay | Admitting: *Deleted

## 2022-01-10 ENCOUNTER — Ambulatory Visit: Payer: 59 | Admitting: *Deleted

## 2022-01-10 ENCOUNTER — Ambulatory Visit: Payer: 59 | Attending: Maternal & Fetal Medicine

## 2022-01-10 ENCOUNTER — Other Ambulatory Visit: Payer: Self-pay | Admitting: Maternal & Fetal Medicine

## 2022-01-10 ENCOUNTER — Other Ambulatory Visit: Payer: Self-pay | Admitting: *Deleted

## 2022-01-10 ENCOUNTER — Encounter: Payer: Self-pay | Admitting: *Deleted

## 2022-01-10 ENCOUNTER — Other Ambulatory Visit: Payer: Self-pay

## 2022-01-10 ENCOUNTER — Telehealth: Payer: 59 | Admitting: Nurse Practitioner

## 2022-01-10 VITALS — BP 111/68 | HR 96

## 2022-01-10 DIAGNOSIS — O409XX0 Polyhydramnios, unspecified trimester, not applicable or unspecified: Secondary | ICD-10-CM

## 2022-01-10 DIAGNOSIS — Z8759 Personal history of other complications of pregnancy, childbirth and the puerperium: Secondary | ICD-10-CM

## 2022-01-10 DIAGNOSIS — O09299 Supervision of pregnancy with other poor reproductive or obstetric history, unspecified trimester: Secondary | ICD-10-CM | POA: Diagnosis present

## 2022-01-10 DIAGNOSIS — O0990 Supervision of high risk pregnancy, unspecified, unspecified trimester: Secondary | ICD-10-CM | POA: Insufficient documentation

## 2022-01-10 DIAGNOSIS — O09899 Supervision of other high risk pregnancies, unspecified trimester: Secondary | ICD-10-CM | POA: Insufficient documentation

## 2022-01-10 DIAGNOSIS — Z641 Problems related to multiparity: Secondary | ICD-10-CM

## 2022-01-10 DIAGNOSIS — O283 Abnormal ultrasonic finding on antenatal screening of mother: Secondary | ICD-10-CM

## 2022-01-10 DIAGNOSIS — O99213 Obesity complicating pregnancy, third trimester: Secondary | ICD-10-CM

## 2022-01-10 DIAGNOSIS — O133 Gestational [pregnancy-induced] hypertension without significant proteinuria, third trimester: Secondary | ICD-10-CM

## 2022-01-10 DIAGNOSIS — Z3A34 34 weeks gestation of pregnancy: Secondary | ICD-10-CM

## 2022-01-10 DIAGNOSIS — O403XX0 Polyhydramnios, third trimester, not applicable or unspecified: Secondary | ICD-10-CM

## 2022-01-10 DIAGNOSIS — M25531 Pain in right wrist: Secondary | ICD-10-CM

## 2022-01-10 NOTE — Progress Notes (Signed)
Virtual Visit Consent   [name], you are scheduled for a virtual visit with [name], FNP, a [facility] provider, today.     Just as with appointments in the office, your consent must be obtained to participate.  Your consent will be active for this visit and any virtual visit you may have with one of our providers in the next 365 days.     If you have a MyChart account, a copy of this consent can be sent to you electronically.  All virtual visits are billed to your insurance company just like a traditional visit in the office.    As this is a virtual visit, video technology does not allow for your provider to perform a traditional examination.  This may limit your provider's ability to fully assess your condition.  If your provider identifies any concerns that need to be evaluated in person or the need to arrange testing (such as labs, EKG, etc.), we will make arrangements to do so.     Although advances in technology are sophisticated, we cannot ensure that it will always work on either your end or our end.  If the connection with a video visit is poor, the visit may have to be switched to a telephone visit.  With either a video or telephone visit, we are not always able to ensure that we have a secure connection.     I need to obtain your verbal consent now.   Are you willing to proceed with your visit today? YES   Isobelle Tuckett Gratz has provided verbal consent on 01/10/2022 for a virtual visit (video or telephone).   [name], FNP   Date: 01/10/2022 6:15 PM   Virtual Visit via Video Note   I, [name], connected with [name] ([id number], 1986-07-21) on 01/10/22 at  6:30 PM EST by a video-enabled telemedicine application and verified that I am speaking with the correct person using two identifiers.  Location: Patient: Virtual Visit Location Patient: Home Provider: Virtual Visit Location Provider: Mobile   I discussed the  limitations of evaluation and management by telemedicine and the availability of in person appointments. The patient expressed understanding and agreed to proceed.    History of Present Illness: [name] is a 36 y.o. who identifies as a female who was assigned female at birth, and is being seen today for WRIST PAIN.  HPI: Patient just got out of the hospital where she was in early labor. She has had wrist pain since before she got pregnant. Pain is usually worse when it is raining. She says wen she lays down at night her wrist starts aching and tingling. Sometime sit gets numb and other times it just hurts. Rates pain 7/10. She has taken tylenol and that does not help. She says her fingers go numb a lot .   Wrist Pain    Review of Systems  All other systems reviewed and are negative.  Problems:  Patient Active Problem List   Diagnosis Date Noted   [id number] affecting puerperium 12/19/2021   Unwanted fertility 12/19/2021   Abnormal antenatal ultrasound 10/09/2021   Supervision of high risk pregnancy, antepartum 07/03/2021   History of preterm delivery, currently pregnant 07/03/2021   History of pre-eclampsia in prior pregnancy, currently pregnant 07/03/2021   History of gestational hypertension 07/03/2021   Grand multipara 07/03/2021   Gestational hypertension 03/16/2020   ADHD (attention deficit hyperactivity disorder), 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   Vaginal venereal warts 04/24/2015  Anxiety state 03/27/2014   OSA (obstructive sleep apnea) 09/15/2013   Asthma 05/21/2013   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   Intellectual disability 09/15/2012   Obesity (BMI 30-39.9) 08/03/2012   Bipolar affective disorder, depressed, moderate degree ([place]) 06/02/2012    Class: Acute   GERD (gastroesophageal reflux disease) 05/19/2012   Borderline behavior 04/12/2012    Class: Acute    Allergies:  Allergies  Allergen Reactions   Depakote [Divalproex Sodium] Other (See Comments)    Pt  states that this medication makes her blood levels toxic.     Methylphenidate Derivatives Other (See Comments)    Reaction:  Depression and anger    Neurontin [Gabapentin] Other (See Comments)    Reaction:  Dizziness    Prozac [Fluoxetine Hcl] Other (See Comments)    Reaction:  Anger    Methylphenidate Hcl     SAME AS PREVIOUS LISTED METHYLPHENIDATE   Medications:  Current Outpatient Medications:    albuterol (VENTOLIN HFA) 108 (90 Base) MCG/ACT inhaler, Inhale 2 puffs into the lungs every 6 (six) hours as needed for wheezing or shortness of breath. (Patient not taking: Reported on 01/10/2022), Disp: 8 g, Rfl: 2   Blood Pressure Monitoring (BLOOD PRESSURE KIT) DEVI, 1 Device by Does not apply route once a week., Disp: 1 each, Rfl: 0   famotidine (PEPCID) 20 MG tablet, Take 1 tablet (20 mg total) by mouth 2 (two) times daily as needed for heartburn or indigestion., Disp: 60 tablet, Rfl: 5   lamoTRIgine (LAMICTAL) 25 MG tablet, Take 25 mg by mouth daily., Disp: , Rfl:    Prenatal Vit-Fe Fumarate-FA (PRENATAL MULTIVITAMIN) TABS tablet, Take 1 tablet by mouth daily at 12 noon., Disp: , Rfl:   Observations/Objective: Patient is well-developed, well-nourished in no acute distress.  Resting comfortably  at home.  Head is normocephalic, atraumatic.  No labored breathing.  Speech is clear and coherent with logical content.  Patient is alert and oriented at baseline.  FROM of wrist with pain on any movement  Assessment and Plan:  Magdelena R Engelson in today with chief complaint of Wrist Pain   1. Right wrist pain ( probable Carpal Tunnel ) Need wrist brace that will keep wrist from moving- wear all the time. Continue to take tylenol See ortho or PCP after having baby    Follow Up Instructions: I discussed the assessment and treatment plan with the patient. The patient was provided an opportunity to ask questions and all were answered. The patient agreed with the plan and demonstrated an  understanding of the instructions.  A copy of instructions were sent to the patient via MyChart.  The patient was advised to call back or seek an in-person evaluation if the symptoms worsen or if the condition fails to improve as anticipated.  Time:  I spent 13 minutes with the patient via telehealth technology discussing the above problems/concerns.    [name], FNP

## 2022-01-10 NOTE — Patient Instructions (Signed)
Carpal Tunnel Syndrome Carpal tunnel syndrome is a condition that causes pain, weakness, and numbness in your hand and arm. Numbness is when you cannot feel an area in your body. The carpal tunnel is a narrow area that is on the palm side of your wrist. Repeated wrist motion or certain diseases may cause swelling in the tunnel. This swelling can pinch the main nerve in the wrist. This nerve is called the median nerve. What are the causes? This condition may be caused by: Moving your hand and wrist over and over again while doing a task. Injury to the wrist. Arthritis. A sac of fluid (cyst) or abnormal growth (tumor) in the carpal tunnel. Fluid buildup during pregnancy. Use of tools that vibrate. Sometimes the cause is not known. What increases the risk? The following factors may make you more likely to have this condition: Having a job that makes you do these things: Move your hand over and over again. Work with tools that vibrate, such as drills or sanders. Being a woman. Having diabetes, obesity, thyroid problems, or kidney failure. What are the signs or symptoms? Symptoms of this condition include: A tingling feeling in your fingers. Tingling or loss of feeling in your hand. Pain in your entire arm. This pain may get worse when you bend your wrist and elbow for a long time. Pain in your wrist that goes up your arm to your shoulder. Pain that goes down into your palm or fingers. Weakness in your hands. You may find it hard to grab and hold items. You may feel worse at night. How is this treated? This condition may be treated with: Lifestyle changes. You will be asked to stop or change the activity that caused your problem. Doing exercises and activities that make bones, muscles, and tendons stronger (physical therapy). Learning how to use your hand again (occupational therapy). Medicines for pain and swelling. You may have injections in your wrist. A wrist splint or  brace. Surgery. Follow these instructions at home: If you have a splint or brace: Wear the splint or brace as told by your doctor. Take it off only as told by your doctor. Loosen the splint if your fingers: Tingle. Become numb. Turn cold and blue. Keep the splint or brace clean. If the splint or brace is not waterproof: Do not let it get wet. Cover it with a watertight covering when you take a bath or a shower. Managing pain, stiffness, and swelling If told, put ice on the painful area: If you have a removable splint or brace, remove it as told by your doctor. Put ice in a plastic bag. Place a towel between your skin and the bag. Leave the ice on for 20 minutes, 2-3 times per day. Do not fall asleep with the cold pack on your skin. Take off the ice if your skin turns bright red. This is very important. If you cannot feel pain, heat, or cold, you have a greater risk of damage to the area. Move your fingers often to reduce stiffness and swelling. General instructions Take over-the-counter and prescription medicines only as told by your doctor. Rest your wrist from any activity that may cause pain. If needed, talk with your boss at work about changes that can help your wrist heal. Do exercises as told by your doctor, physical therapist, or occupational therapist. Keep all follow-up visits. Contact a doctor if: You have new symptoms. Medicine does not help your pain. Your symptoms get worse. Get help right away  if: You have very bad numbness or tingling in your wrist or hand. Summary Carpal tunnel syndrome is a condition that causes pain in your hand and arm. It is often caused by repeated wrist motions. Lifestyle changes and medicines are used to treat this problem. Surgery may help in very bad cases. Follow your doctor's instructions about wearing a splint, resting your wrist, keeping follow-up visits, and calling for help. This information is not intended to replace advice given  to you by your health care provider. Make sure you discuss any questions you have with your health care provider. Document Revised: 04/12/2020 Document Reviewed: 04/12/2020 Elsevier Patient Education  [place].

## 2022-01-14 ENCOUNTER — Ambulatory Visit: Payer: 59

## 2022-01-14 ENCOUNTER — Encounter (HOSPITAL_COMMUNITY): Payer: Self-pay | Admitting: *Deleted

## 2022-01-14 ENCOUNTER — Telehealth: Payer: Self-pay | Admitting: Family Medicine

## 2022-01-14 ENCOUNTER — Telehealth (HOSPITAL_COMMUNITY): Payer: Self-pay | Admitting: *Deleted

## 2022-01-14 NOTE — Telephone Encounter (Signed)
Late entry.  I was attending on L&D yesterday afternoon when I was notified by clinical staff that patient wished to discuss early IOL due to childcare issues. They transferred the call to me and she reported that she could only obtain childcare in her 36th week of pregnancy, she is currently scheduled for a [redacted]w[redacted]d IOL. I reviewed with Aeon that it would be medically, ethically, and legally wrong to have an iatrogenic preterm birth for this reason. I strongly advised that she find another solution for her already scheduled date. She understood and plan to contact more family members to find a solution.   Clarnce Flock, MD/MPH Attending Family Medicine Physician, [facility] for [facility], [place]

## 2022-01-14 NOTE — Telephone Encounter (Signed)
Preadmission screen  

## 2022-01-15 ENCOUNTER — Inpatient Hospital Stay (HOSPITAL_COMMUNITY)
Admission: AD | Admit: 2022-01-15 | Discharge: 2022-01-15 | Disposition: A | Discharge status: Home / Self Care | Payer: 59 | Attending: Obstetrics and Gynecology | Admitting: Obstetrics and Gynecology

## 2022-01-15 ENCOUNTER — Ambulatory Visit: Payer: 59

## 2022-01-15 ENCOUNTER — Other Ambulatory Visit: Payer: Self-pay | Admitting: Obstetrics and Gynecology

## 2022-01-15 ENCOUNTER — Other Ambulatory Visit: Payer: Self-pay

## 2022-01-15 ENCOUNTER — Encounter: Payer: Self-pay | Admitting: *Deleted

## 2022-01-15 ENCOUNTER — Inpatient Hospital Stay (HOSPITAL_BASED_OUTPATIENT_CLINIC_OR_DEPARTMENT_OTHER): Payer: 59

## 2022-01-15 ENCOUNTER — Ambulatory Visit: Payer: 59 | Admitting: *Deleted

## 2022-01-15 ENCOUNTER — Ambulatory Visit (HOSPITAL_BASED_OUTPATIENT_CLINIC_OR_DEPARTMENT_OTHER): Payer: 59 | Admitting: *Deleted

## 2022-01-15 ENCOUNTER — Ambulatory Visit (HOSPITAL_BASED_OUTPATIENT_CLINIC_OR_DEPARTMENT_OTHER): Payer: 59

## 2022-01-15 VITALS — BP 117/69 | HR 101

## 2022-01-15 DIAGNOSIS — O09899 Supervision of other high risk pregnancies, unspecified trimester: Secondary | ICD-10-CM | POA: Insufficient documentation

## 2022-01-15 DIAGNOSIS — O133 Gestational [pregnancy-induced] hypertension without significant proteinuria, third trimester: Secondary | ICD-10-CM | POA: Insufficient documentation

## 2022-01-15 DIAGNOSIS — O283 Abnormal ultrasonic finding on antenatal screening of mother: Secondary | ICD-10-CM | POA: Insufficient documentation

## 2022-01-15 DIAGNOSIS — O403XX0 Polyhydramnios, third trimester, not applicable or unspecified: Secondary | ICD-10-CM | POA: Diagnosis not present

## 2022-01-15 DIAGNOSIS — O09299 Supervision of pregnancy with other poor reproductive or obstetric history, unspecified trimester: Secondary | ICD-10-CM

## 2022-01-15 DIAGNOSIS — O409XX0 Polyhydramnios, unspecified trimester, not applicable or unspecified: Secondary | ICD-10-CM

## 2022-01-15 DIAGNOSIS — Z3A35 35 weeks gestation of pregnancy: Secondary | ICD-10-CM | POA: Insufficient documentation

## 2022-01-15 DIAGNOSIS — Z8759 Personal history of other complications of pregnancy, childbirth and the puerperium: Secondary | ICD-10-CM

## 2022-01-15 DIAGNOSIS — O0993 Supervision of high risk pregnancy, unspecified, third trimester: Secondary | ICD-10-CM | POA: Insufficient documentation

## 2022-01-15 DIAGNOSIS — O0990 Supervision of high risk pregnancy, unspecified, unspecified trimester: Secondary | ICD-10-CM

## 2022-01-15 DIAGNOSIS — Z3689 Encounter for other specified antenatal screening: Secondary | ICD-10-CM | POA: Diagnosis not present

## 2022-01-15 DIAGNOSIS — Z641 Problems related to multiparity: Secondary | ICD-10-CM | POA: Insufficient documentation

## 2022-01-15 DIAGNOSIS — O289 Unspecified abnormal findings on antenatal screening of mother: Secondary | ICD-10-CM | POA: Diagnosis not present

## 2022-01-15 DIAGNOSIS — O99213 Obesity complicating pregnancy, third trimester: Secondary | ICD-10-CM

## 2022-01-15 DIAGNOSIS — O368130 Decreased fetal movements, third trimester, not applicable or unspecified: Secondary | ICD-10-CM | POA: Insufficient documentation

## 2022-01-15 NOTE — MAU Provider Note (Signed)
Chief Complaint:  Decreased Fetal Movement (MD sent for BPP and monitoring)   Event Date/Time   First Provider Initiated Contact with Patient 01/15/22 1406      HPI: [name] is a 36 y.o. [id number] at 11w1dby LMP who presents to maternity admissions sent from MFM for 6/10 BPP.  She reports some contractions and back pain but denies any leaking fluid, h/a, epigastric pain, or visual disturbances.  She reports good fetal movement.     HPI  Past Medical History: Past Medical History:  Diagnosis Date   Anxiety    Asthma    inhaler PRN-hasn't used inhaler in months   Attention deficit hyperactivity disorder    Bipolar disorder ([facility]    hospitalized as a teen for suicidal ideations and cutting   Depression    no complaints, doing well   Gastritis    GERD (gastroesophageal reflux disease)    History of gestational diabetes in prior pregnancy, currently pregnant 10/13/2017   History of preterm delivery after IOL for preeclampsia, currently pregnant 10/13/2017   Not a 17P candidate   Hx of varicella    Mild preeclampsia 02/09/2016   Nausea and vomiting during pregnancy 10/04/2019   Vaginal Pap smear, abnormal     Past obstetric history: OB History  Gravida Para Term Preterm AB Living  8 5 4 1 2 5   SAB IAB Ectopic Multiple Live Births  2     0 5    # Outcome Date GA Lbr Len/2nd Weight Sex Delivery Anes PTL Lv  8 Current           7 SAB 12/2020     SAB     6 Term 03/16/20 359w0d 00:14 3235 g F Vag-Spont EPI  LIV  5 SAB 05/2019          4 Term 12/17/18 [id number] g F Vag-Spont   LIV     Birth Comments: Hypertension  3 Term 02/19/18 37w63w2d:02 / 01:46 3790 g M Vag-Spont EPI  LIV     Birth Comments: hypertension  2 Preterm 02/03/17 35w645w6d0 g M Vag-Spont EPI  LIV     Birth Comments: hypertension  1 Term 02/11/16 78w1d59w1d14 3702 g F Vag-Spont EPI  LIV     Birth Comments: preeclampsia    Past Surgical History: Past Surgical History:  Procedure Laterality  Date   DILATION AND EVACUATION N/A 01/08/2021   Procedure: DILATATION AND EVACUATION;  Surgeon: Ervin,Chancy Milroy [facility];  Laterality: N/A;   EYE MUSCLE SURGERY Bilateral    07/29/12   MOUTH SURGERY     PLANTAR FASCIA SURGERY     WISDOM TOOTH EXTRACTION      Family History: Family History  Problem Relation Age of Onset   Depression Mother    Colon cancer Mother    Colon polyps Mother    Depression Father    Lung cancer Father    Depression Brother    Liver disease Other    Kidney disease Other     Social History: Social History   Tobacco Use   Smoking status: Never   Smokeless tobacco: Never  Vaping Use   Vaping Use: Never used  Substance Use Topics   Alcohol use: No    Alcohol/week: 0.0 standard drinks   Drug use: No    Allergies:  Allergies  Allergen Reactions   Depakote [Divalproex Sodium] Other (See Comments)  Pt states that this medication makes her blood levels toxic.     Methylphenidate Derivatives Other (See Comments)    Reaction:  Depression and anger    Neurontin [Gabapentin] Other (See Comments)    Reaction:  Dizziness    Prozac [Fluoxetine Hcl] Other (See Comments)    Reaction:  Anger    Methylphenidate Hcl     SAME AS PREVIOUS LISTED METHYLPHENIDATE    Meds:  No medications prior to admission.    ROS:  Review of Systems  Constitutional:  Negative for chills, fatigue and fever.  Eyes:  Negative for visual disturbance.  Respiratory:  Negative for shortness of breath.   Cardiovascular:  Negative for chest pain.  Gastrointestinal:  Positive for abdominal pain. Negative for nausea and vomiting.  Genitourinary:  Negative for difficulty urinating, dysuria, flank pain, pelvic pain, vaginal bleeding, vaginal discharge and vaginal pain.  Musculoskeletal:  Positive for back pain.  Neurological:  Negative for dizziness and headaches.  Psychiatric/Behavioral: Negative.      I have reviewed  patient's Past Medical Hx, Surgical Hx, Family Hx, Social Hx, medications and allergies.   Physical Exam  Patient Vitals for the past 24 hrs:  BP Temp Temp src Pulse Resp SpO2 Height Weight  01/15/22 1343 118/88 98.6 F (37 C) Oral (!) 118 16 100 % 5' 3"  (1.6 m) 104.3 kg   Constitutional: Well-developed, well-nourished female in no acute distress.  Cardiovascular: normal rate Respiratory: normal effort GI: Abd soft, non-tender, gravid appropriate for gestational age.  MS: Extremities nontender, no edema, normal ROM Neurologic: Alert and oriented x 4.  GU: Neg CVAT.  PELVIC EXAM: Cervix pink, visually closed, without lesion, scant white creamy discharge, vaginal walls and external genitalia normal Bimanual exam: Cervix 0/long/high, firm, anterior, neg CMT, uterus nontender, nonenlarged, adnexa without tenderness, enlargement, or mass     FHT:  Baseline 130, moderate variability, accelerations present, no decelerations Contractions: rare, irregular, mild to palpation   Labs: No results found. However, due to the size of the patient record, not all encounters were searched. Please check Results Review for a complete set of results. O/Positive/-- (08/22 1358)  Imaging:  [place] MFM FETAL BPP W/NONSTRESS  Result Date: 01/15/2022 ----------------------------------------------------------------------  OBSTETRICS REPORT                       (Signed Final 01/15/2022 12:45 pm) ---------------------------------------------------------------------- Patient Info  ID #:       [id number]                          D.O.B.:  09-30-1986 (35 yrs)  Name:       [name]              Visit Date: 01/15/2022 11:46 am ---------------------------------------------------------------------- Performed By  Attending:        Sander Nephew      Secondary Phy.:   Luvenia Redden                    MD                                                             PA  Performed By:     Rodrigo Ran BS  Address:           [address]                    [facility]                                                             [place],                                                             [place] [address]  Referred By:      [place]          [facility]:         [facility]                                [facility] at                                                             [facility] for                                                             Women  Ref. Address:     [address]                    [place], [place] ---------------------------------------------------------------------- Orders  #  Description                           Code        Ordered By  1  [place] MFM FETAL BPP                      24462.8     [place]     W/NONSTRESS ----------------------------------------------------------------------  #  Order #                     Accession #                Episode #  1  [id number]                   [id number]                 [id number] ---------------------------------------------------------------------- Indications  [redacted] weeks gestation of pregnancy  Z3A.35  Hypertension - Gestational                     O13.9  Echogenic bowel                                O35.8XX0 028.3  Poor obstetric history: Previous               O09.299  preeclampsia / eclampsia/gestational HTN  Poor obstetric history: Previous preterm       O09.219  delivery, antepartum  Poor obstetric history: Previous gestational   O09.299  diabetes  Short interval between pregancies, 3rd         O09.893  trimester  Other mental disorder complicating             O99.340  pregnancy, third trimester  Asthma                                         O99.89 R32.023  Obesity complicating pregnancy, third          O99.213  trimester (pregravid BMI 39)  LR NIPS/ Negative Horizon  Encounter for other antenatal screening        Z36.2  follow-up  Decreased fetal movements, third  trimester,    O36.8130  unspecified  Polyhydramnios, third trimester, antepartum    O40.3XX0  condition or complication, unspecified fetus ---------------------------------------------------------------------- Fetal Evaluation  Num Of Fetuses:         1  Fetal Heart Rate(bpm):  144  Cardiac Activity:       Observed  Presentation:           Cephalic  Amniotic Fluid  AFI FV:      Within normal limits  AFI Sum(cm)     %Tile       Largest Pocket(cm)  22              83          7.5  RUQ(cm)       RLQ(cm)       LUQ(cm)        LLQ(cm)  7.5           3.4           4.4            6.7 ---------------------------------------------------------------------- Biophysical Evaluation  Amniotic F.V:   Within normal limits       F. Tone:        Not Observed  F. Movement:    Not Observed               N.S.T:          Reactive  F. Breathing:   Observed                   Score:          6/10 ---------------------------------------------------------------------- OB History  Blood Type:   O+  Gravidity:    8         Term:   4        Prem:   1        SAB:   2  TOP:          0       Ectopic:  0  Living: 5 ---------------------------------------------------------------------- Gestational Age  LMP:           35w 1d        Date:  05/14/21                 EDD:   02/18/22  Best:          35w 1d     Det. By:  LMP  (05/14/21)          EDD:   02/18/22 ---------------------------------------------------------------------- Impression  Antenatal testing performed given maternal elevated BMI,  with GHTN and history of polyhydramnios.  The biophysical profile was 6/10 with good fetal movement  and amniotic fluid volume.  Ms. Grauberger reports good fetal movement.  I discussed todays finding of a BPP 6/10 which is an  equivocal result. The NST was reactive with moderate  variability.  She needed to go home for child care reason, I explained  that we needed to have the BPP repeated within 3-4 hours.  (time of BPP 11:46 am).  She will return to the  MAU by 3:30 for a BPP.  I called the MAU but the providers were in a delivery and  unable to speak with me. I was asked to leave a message  (LM with [place]). ---------------------------------------------------------------------- Recommendations  Go to MAU for repeat BPP at first opportunity or within 3-4  hours.  Repeat testing in 1 week. ----------------------------------------------------------------------               Sander Nephew, MD Electronically Signed Final Report   01/15/2022 12:45 pm ----------------------------------------------------------------------         MAU Course/MDM: Orders Placed This Encounter  Procedures   [place] MFM Fetal BPP Wo Non Stress   Discharge patient    No orders of the defined types were placed in this encounter.    BPP repeated today in MAU and results 8/8, final report pending  NST reviewed and reactive so 10/10 BPP in MAU today Consult Dr Gertie Exon with presentation, exam findings and test results.  D/C home with return precautions Keep scheduled prenatal visits and [place] with MFM Return to MAU as needed for emergencies   Assessment: 1. NST (non-stress test) reactive   2. Gestational hypertension, third trimester   3. [redacted] weeks gestation of pregnancy     Plan: Discharge home Labor precautions and fetal kick counts  Allergies as of 01/15/2022       Reactions   Depakote [divalproex Sodium] Other (See Comments)   Pt states that this medication makes her blood levels toxic.     Methylphenidate Derivatives Other (See Comments)   Reaction:  Depression and anger    Neurontin [gabapentin] Other (See Comments)   Reaction:  Dizziness    Prozac [fluoxetine Hcl] Other (See Comments)   Reaction:  Anger    Methylphenidate Hcl    SAME AS PREVIOUS LISTED METHYLPHENIDATE        Medication List     TAKE these medications    albuterol 108 (90 Base) MCG/ACT inhaler Commonly known as: VENTOLIN HFA Inhale 2 puffs into the lungs every 6 (six)  hours as needed for wheezing or shortness of breath.   Blood Pressure Kit Devi 1 Device by Does not apply route once a week.   famotidine 20 MG tablet Commonly known as: Pepcid Take 1 tablet (20 mg total) by mouth 2 (two) times daily as needed for heartburn or indigestion.   lamoTRIgine 25 MG tablet Commonly known as: LAMICTAL Take 25 mg  by mouth daily.   prenatal multivitamin Tabs tablet Take 1 tablet by mouth daily at 12 noon.        Fatima Blank Certified Nurse-Midwife 01/15/2022 4:47 PM

## 2022-01-15 NOTE — Procedures (Signed)
[name] 01/18/1986 [redacted]w[redacted]d  Fetus A Non-Stress Test Interpretation for 01/15/22  Indication: failed BPP  Fetal Heart Rate A Mode: External Baseline Rate (A): 130 bpm Variability: Moderate Accelerations: 15 x 15 Decelerations: None Multiple birth?: No  Uterine Activity Mode: Palpation Contraction Frequency (min): none Resting Tone Palpated: Relaxed  Interpretation (Fetal Testing) Nonstress Test Interpretation: Reactive Overall Impression: Reassuring for gestational age Comments: tracing reviewed by Dr. Gertie Exon

## 2022-01-15 NOTE — MAU Note (Signed)
Here with complaints of being sent by MD for ultrasound and monitoring.

## 2022-01-16 ENCOUNTER — Encounter: Payer: 59 | Admitting: Family Medicine

## 2022-01-17 ENCOUNTER — Ambulatory Visit: Payer: 59

## 2022-01-20 ENCOUNTER — Telehealth: Payer: Self-pay

## 2022-01-20 ENCOUNTER — Encounter: Payer: Self-pay | Admitting: Family Medicine

## 2022-01-20 NOTE — Telephone Encounter (Signed)
Patient called in to cancel her BPP scheduled for 2/10 due to her being induced on 2/14. Spoke with Dr. Donalee Citrin and he said it was okay if the patient wanted to cancel.   Patient wanted to continue with cancellation and I have cancelled that appointment on 2/10.

## 2022-01-21 ENCOUNTER — Ambulatory Visit: Payer: 59

## 2022-01-21 ENCOUNTER — Other Ambulatory Visit: Payer: Self-pay | Admitting: Family Medicine

## 2022-01-22 ENCOUNTER — Telehealth: Payer: 59 | Admitting: Family Medicine

## 2022-01-22 ENCOUNTER — Encounter (HOSPITAL_COMMUNITY): Payer: Self-pay | Admitting: Family Medicine

## 2022-01-22 ENCOUNTER — Encounter: Payer: Self-pay | Admitting: Family Medicine

## 2022-01-22 ENCOUNTER — Other Ambulatory Visit: Payer: Self-pay

## 2022-01-22 ENCOUNTER — Telehealth (INDEPENDENT_AMBULATORY_CARE_PROVIDER_SITE_OTHER): Payer: 59 | Admitting: Family Medicine

## 2022-01-22 VITALS — BP 142/96 | HR 120

## 2022-01-22 DIAGNOSIS — O09899 Supervision of other high risk pregnancies, unspecified trimester: Secondary | ICD-10-CM

## 2022-01-22 DIAGNOSIS — O0990 Supervision of high risk pregnancy, unspecified, unspecified trimester: Secondary | ICD-10-CM

## 2022-01-22 DIAGNOSIS — F3132 Bipolar disorder, current episode depressed, moderate: Secondary | ICD-10-CM

## 2022-01-22 DIAGNOSIS — Z3009 Encounter for other general counseling and advice on contraception: Secondary | ICD-10-CM

## 2022-01-22 DIAGNOSIS — O09293 Supervision of pregnancy with other poor reproductive or obstetric history, third trimester: Secondary | ICD-10-CM

## 2022-01-22 DIAGNOSIS — O99343 Other mental disorders complicating pregnancy, third trimester: Secondary | ICD-10-CM

## 2022-01-22 DIAGNOSIS — O09893 Supervision of other high risk pregnancies, third trimester: Secondary | ICD-10-CM

## 2022-01-22 DIAGNOSIS — Z641 Problems related to multiparity: Secondary | ICD-10-CM

## 2022-01-22 DIAGNOSIS — F79 Unspecified intellectual disabilities: Secondary | ICD-10-CM

## 2022-01-22 DIAGNOSIS — O09299 Supervision of pregnancy with other poor reproductive or obstetric history, unspecified trimester: Secondary | ICD-10-CM

## 2022-01-22 DIAGNOSIS — Z3A36 36 weeks gestation of pregnancy: Secondary | ICD-10-CM

## 2022-01-22 DIAGNOSIS — O133 Gestational [pregnancy-induced] hypertension without significant proteinuria, third trimester: Secondary | ICD-10-CM

## 2022-01-22 DIAGNOSIS — Z8759 Personal history of other complications of pregnancy, childbirth and the puerperium: Secondary | ICD-10-CM

## 2022-01-22 DIAGNOSIS — O0993 Supervision of high risk pregnancy, unspecified, third trimester: Secondary | ICD-10-CM

## 2022-01-22 NOTE — Patient Instructions (Signed)
Contraception Choices Contraception, also called birth control, refers to methods or devices that prevent pregnancy. Hormonal methods Contraceptive implant A contraceptive implant is a thin, plastic tube that contains a hormone that prevents pregnancy. It is different from an intrauterine device (IUD). It is inserted into the upper part of the arm by a health care provider. Implants can be effective for up to 3 years. Progestin-only injections Progestin-only injections are injections of progestin, a synthetic form of the hormone progesterone. They are given every 3 months by a health care provider. Birth control pills Birth control pills are pills that contain hormones that prevent pregnancy. They must be taken once a day, preferably at the same time each day. A prescription is needed to use this method of contraception. Birth control patch The birth control patch contains hormones that prevent pregnancy. It is placed on the skin and must be changed once a week for three weeks and removed on the fourth week. A prescription is needed to use this method of contraception. Vaginal ring A vaginal ring contains hormones that prevent pregnancy. It is placed in the vagina for three weeks and removed on the fourth week. After that, the process is repeated with a new ring. A prescription is needed to use this method of contraception. Emergency contraceptive Emergency contraceptives prevent pregnancy after unprotected sex. They come in pill form and can be taken up to 5 days after sex. They work best the sooner they are taken after having sex. Most emergency contraceptives are available without a prescription. This method should not be used as your only form of birth control. Barrier methods Female condom A female condom is a thin sheath that is worn over the penis during sex. Condoms keep sperm from going inside a woman's body. They can be used with a sperm-killing substance (spermicide) to increase their  effectiveness. They should be thrown away after one use. Female condom A female condom is a soft, loose-fitting sheath that is put into the vagina before sex. The condom keeps sperm from going inside a woman's body. They should be thrown away after one use. Diaphragm A diaphragm is a soft, dome-shaped barrier. It is inserted into the vagina before sex, along with a spermicide. The diaphragm blocks sperm from entering the uterus, and the spermicide kills sperm. A diaphragm should be left in the vagina for 6-8 hours after sex and removed within 24 hours. A diaphragm is prescribed and fitted by a health care provider. A diaphragm should be replaced every 1-2 years, after giving birth, after gaining more than 15 lb (6.8 kg), and after pelvic surgery. Cervical cap A cervical cap is a round, soft latex or plastic cup that fits over the cervix. It is inserted into the vagina before sex, along with spermicide. It blocks sperm from entering the uterus. The cap should be left in place for 6-8 hours after sex and removed within 48 hours. A cervical cap must be prescribed and fitted by a health care provider. It should be replaced every 2 years. Sponge A sponge is a soft, circular piece of polyurethane foam with spermicide in it. The sponge helps block sperm from entering the uterus, and the spermicide kills sperm. To use it, you make it wet and then insert it into the vagina. It should be inserted before sex, left in for at least 6 hours after sex, and removed and thrown away within 30 hours. Spermicides Spermicides are chemicals that kill or block sperm from entering the cervix and uterus.   They can come as a cream, jelly, suppository, foam, or tablet. A spermicide should be inserted into the vagina with an applicator at least 10-15 minutes before sex to allow time for it to work. The process must be repeated every time you have sex. Spermicides do not require a prescription. Intrauterine  contraception Intrauterine device (IUD) An IUD is a T-shaped device that is put in a woman's uterus. There are two types: Hormone IUD.This type contains progestin, a synthetic form of the hormone progesterone. This type can stay in place for 3-5 years. Copper IUD.This type is wrapped in copper wire. It can stay in place for 10 years. Permanent methods of contraception Female tubal ligation In this method, a woman's fallopian tubes are sealed, tied, or blocked during surgery to prevent eggs from traveling to the uterus. Hysteroscopic sterilization In this method, a small, flexible insert is placed into each fallopian tube. The inserts cause scar tissue to form in the fallopian tubes and block them, so sperm cannot reach an egg. The procedure takes about 3 months to be effective. Another form of birth control must be used during those 3 months. Female sterilization This is a procedure to tie off the tubes that carry sperm (vasectomy). After the procedure, the man can still ejaculate fluid (semen). Another form of birth control must be used for 3 months after the procedure. Natural planning methods Natural family planning In this method, a couple does not have sex on days when the woman could become pregnant. Calendar method In this method, the woman keeps track of the length of each menstrual cycle, identifies the days when pregnancy can happen, and does not have sex on those days. Ovulation method In this method, a couple avoids sex during ovulation. Symptothermal method This method involves not having sex during ovulation. The woman typically checks for ovulation by watching changes in her temperature and in the consistency of cervical mucus. Post-ovulation method In this method, a couple waits to have sex until after ovulation. Where to find more information Centers for Disease Control and Prevention: www.cdc.gov Summary Contraception, also called birth control, refers to methods or devices  that prevent pregnancy. Hormonal methods of contraception include implants, injections, pills, patches, vaginal rings, and emergency contraceptives. Barrier methods of contraception can include female condoms, female condoms, diaphragms, cervical caps, sponges, and spermicides. There are two types of IUDs (intrauterine devices). An IUD can be put in a woman's uterus to prevent pregnancy for 3-5 years. Permanent sterilization can be done through a procedure for males and females. Natural family planning methods involve nothaving sex on days when the woman could become pregnant. This information is not intended to replace advice given to you by your health care provider. Make sure you discuss any questions you have with your health care provider. Document Revised: 05/07/2020 Document Reviewed: 05/07/2020 Elsevier Patient Education  2022 Elsevier Inc.  

## 2022-01-22 NOTE — Progress Notes (Deleted)
I connected with [name] 01/22/22 at 10:35 AM EST by: MyChart video and verified that I am speaking with the correct person using two identifiers.  Patient is located at home and provider is located at [facility].     The purpose of this virtual visit is to provide medical care while limiting exposure to the novel coronavirus. I discussed the limitations, risks, security and privacy concerns of performing an evaluation and management service by MyChart video and the availability of in person appointments. I also discussed with the patient that there may be a patient responsible charge related to this service. By engaging in this virtual visit, you consent to the provision of healthcare.  Additionally, you authorize for your insurance to be billed for the services provided during this visit.  The patient expressed understanding and agreed to proceed.  The following staff members participated in the virtual visit:  Clarnce Flock, MD/MPH Attending Family Medicine Physician, [place] for [facility]     PRENATAL VISIT NOTE  Subjective:  [name] is a 36 y.o. [id number] at [redacted]w[redacted]d  for MyChart video visit for ongoing prenatal care.  She is currently monitored for the following issues for this high-risk pregnancy and has Borderline behavior; GERD (gastroesophageal reflux disease); Bipolar affective disorder, depressed, moderate degree ([place]); Obesity (BMI 30-39.9); OSA (obstructive sleep apnea); Vaginal venereal warts; Anxiety state; Asthma; Consecutive esotropia; Intellectual disability; ADHD (attention deficit hyperactivity disorder), inattentive type; Gestational hypertension; Supervision of high risk pregnancy, antepartum; History of preterm delivery, currently pregnant; History of pre-eclampsia in prior pregnancy, currently pregnant; History of gestational hypertension; [place] multipara; Abnormal antenatal ultrasound; COVID-19  affecting puerperium; and Unwanted fertility on their problem list.  Patient reports {[id number]}.   .  .   . Denies leaking of fluid.   The following portions of the patient's history were reviewed and updated as appropriate: allergies, current medications, past family history, past medical history, past social history, past surgical history and problem list.   Objective:  There were no vitals filed for this visit. Self-Obtained  Fetal Status:           Assessment and Plan:  Pregnancy: [id number] at 106w1d 1. Supervision of high risk pregnancy, antepartum BP *** Normal fetal movement***   2. Gestational hypertension, third trimester Scheduled for IOL 01/28/2022  3. [place] multipara NSVD x5  4. Unwanted fertility Signed BTL papers 11/26/2021  5. Intellectual disability   6. Bipolar affective disorder, depressed, moderate degree (HCC) Stable on lamictal  Preterm labor symptoms and general obstetric precautions including but not limited to vaginal bleeding, contractions, leaking of fluid and fetal movement were reviewed in detail with the patient.  Return in 7 weeks (on 03/12/2022) for PP check.  Future Appointments  Date Time Provider [place]  01/28/2022 12:00 AM [facility] SCHED ROOM [facility] None     Time spent on virtual visit: *** minutes  Clarnce Flock, MD

## 2022-01-22 NOTE — Progress Notes (Signed)
I connected with [name] 01/22/22 at  8:35 AM EST by: MyChart video and verified that I am speaking with the correct person using two identifiers.  Patient is located at home and provider is located at [facility].     The purpose of this virtual visit is to provide medical care while limiting exposure to the novel coronavirus. I discussed the limitations, risks, security and privacy concerns of performing an evaluation and management service by MyChart video and the availability of in person appointments. I also discussed with the patient that there may be a patient responsible charge related to this service. By engaging in this virtual visit, you consent to the provision of healthcare.  Additionally, you authorize for your insurance to be billed for the services provided during this visit.  The patient expressed understanding and agreed to proceed.  The following staff members participated in the virtual visit:  Clarnce Flock, MD/MPH Attending Family Medicine Physician, [place] [facility]     PRENATAL VISIT NOTE  Subjective:  [name] is a 36 y.o. [id number] at [redacted]w[redacted]d  for MyChart video visit for ongoing prenatal care.  She is currently monitored for the following issues for this high-risk pregnancy and has Borderline behavior; GERD (gastroesophageal reflux disease); Bipolar affective disorder, depressed, moderate degree ([place]); Obesity (BMI 30-39.9); OSA (obstructive sleep apnea); Vaginal venereal warts; Anxiety state; Asthma; Consecutive esotropia; Intellectual disability; ADHD (attention deficit hyperactivity disorder), inattentive type; Gestational hypertension; Supervision of high risk pregnancy, antepartum; History of preterm delivery, currently pregnant; History of pre-eclampsia in prior pregnancy, currently pregnant; History of gestational hypertension; [place] multipara; Abnormal antenatal ultrasound; COVID-19  affecting puerperium; and Unwanted fertility on their problem list.  Patient reports no complaints.  Contractions: Irritability. Vag. Bleeding: None.  Movement: Present. Denies leaking of fluid.   The following portions of the patient's history were reviewed and updated as appropriate: allergies, current medications, past family history, past medical history, past social history, past surgical history and problem list.   Objective:   Vitals:   01/22/22 0835  BP: (!) 142/96  Pulse: (!) 120   Self-Obtained  Fetal Status:     Movement: Present     Assessment and Plan:  Pregnancy: [id number] at [redacted]w[redacted]d 1. Supervision of high risk pregnancy, antepartum BP mild range, normal fetal movement Was supposed to have inperson visit today but changed to virtual because of car trouble, discussed she will need to stop by the clinic sometime this week once her car is fixed to get GBS/GC swabs  2. History of preterm delivery, currently pregnant Iatrogenic, 35 weeks  3. History of pre-eclampsia in prior pregnancy, currently pregnant   4. History of gestational hypertension   5. [place] multipara NSVD x5  6. Unwanted fertility Signed papers 11/26/2021, confirmed today she does want to have pp tubl  7. Bipolar affective disorder, depressed, moderate degree (HCC) Stable on lamictal  8. Gestational hypertension, third trimester Scheduled for IOL on 01/28/2022  9. Intellectual disability   Preterm labor symptoms and general obstetric precautions including but not limited to vaginal bleeding, contractions, leaking of fluid and fetal movement were reviewed in detail with the patient.  Return in about 7 weeks (around 03/12/2022) for in person, PP check.  Future Appointments  Date Time Provider [place]  01/28/2022 12:00 AM [facility] SCHED ROOM [facility] None     Time spent on virtual visit: 15 minutes  Clarnce Flock, MD

## 2022-01-23 ENCOUNTER — Encounter: Payer: Self-pay | Admitting: Obstetrics and Gynecology

## 2022-01-23 ENCOUNTER — Encounter: Payer: Self-pay | Admitting: Family Medicine

## 2022-01-23 ENCOUNTER — Ambulatory Visit (INDEPENDENT_AMBULATORY_CARE_PROVIDER_SITE_OTHER): Payer: 59 | Admitting: Obstetrics and Gynecology

## 2022-01-23 VITALS — BP 119/83 | HR 101

## 2022-01-23 DIAGNOSIS — O09299 Supervision of pregnancy with other poor reproductive or obstetric history, unspecified trimester: Secondary | ICD-10-CM

## 2022-01-23 DIAGNOSIS — Z3009 Encounter for other general counseling and advice on contraception: Secondary | ICD-10-CM

## 2022-01-23 DIAGNOSIS — O09899 Supervision of other high risk pregnancies, unspecified trimester: Secondary | ICD-10-CM

## 2022-01-23 DIAGNOSIS — O133 Gestational [pregnancy-induced] hypertension without significant proteinuria, third trimester: Secondary | ICD-10-CM

## 2022-01-23 DIAGNOSIS — O0990 Supervision of high risk pregnancy, unspecified, unspecified trimester: Secondary | ICD-10-CM

## 2022-01-23 NOTE — Progress Notes (Signed)
Subjective:  [name] is a 36 y.o. [id number] at [redacted]w[redacted]d being seen today for ongoing prenatal care.  She is currently monitored for the following issues for this high-risk pregnancy and has Borderline behavior; GERD (gastroesophageal reflux disease); Bipolar affective disorder, depressed, moderate degree ([place]); Obesity (BMI 30-39.9); OSA (obstructive sleep apnea); Vaginal venereal warts; Anxiety state; Asthma; Consecutive esotropia; Intellectual disability; ADHD (attention deficit hyperactivity disorder), inattentive type; Gestational hypertension; Supervision of high risk pregnancy, antepartum; History of preterm delivery, currently pregnant; History of pre-eclampsia in prior pregnancy, currently pregnant; History of gestational hypertension; [place] multipara; Abnormal antenatal ultrasound; COVID-19 affecting puerperium; and Unwanted fertility on their problem list.  Patient reports general discomforts of pregnancy.  Contractions: Irritability. Vag. Bleeding: None.  Movement: Present. Denies leaking of fluid.   The following portions of the patient's history were reviewed and updated as appropriate: allergies, current medications, past family history, past medical history, past social history, past surgical history and problem list. Problem list updated.  Objective:   Vitals:   01/23/22 1624  BP: 119/83  Pulse: (!) 101    Fetal Status: Fetal Heart Rate (bpm): 131   Movement: Present     General:  Alert, oriented and cooperative. Patient is in no acute distress.  Skin: Skin is warm and dry. No rash noted.   Cardiovascular: Normal heart rate noted  Respiratory: Normal respiratory effort, no problems with respiration noted  Abdomen: Soft, gravid, appropriate for gestational age. Pain/Pressure: Present     Pelvic:  Cervical exam performed        Extremities: Normal range of motion.  Edema: None  Mental Status: Normal mood and affect. Normal behavior. Normal judgment and thought content.    Urinalysis:      Assessment and Plan:  Pregnancy: [id number] at [redacted]w[redacted]d  1. Supervision of high risk pregnancy, antepartum Labor preacutions - Culture, beta strep (group b only)  2. Gestational hypertension, third trimester BP stable IOL next week  3. History of preterm delivery, currently pregnant Stable  4. History of pre-eclampsia in prior pregnancy, currently pregnant Stable  5. Unwanted fertility BTL papers signed  Term labor symptoms and general obstetric precautions including but not limited to vaginal bleeding, contractions, leaking of fluid and fetal movement were reviewed in detail with the patient. Please refer to After Visit Summary for other counseling recommendations.  No follow-ups on file.   Chancy Milroy, MD

## 2022-01-23 NOTE — Progress Notes (Signed)
Error, see separate same day encounter

## 2022-01-23 NOTE — Patient Instructions (Signed)
Vaginal Delivery Vaginal delivery means that you give birth by pushing your baby out of your birth canal (vagina). Your health care team will help you before, during, and after vaginal delivery. Birth experiences are unique for every woman and every pregnancy, and birth experiences vary depending on where you choose to give birth. What are the risks and benefits? Generally, this is safe. However, problems may occur, including: Bleeding. Infection. Damage to other structures such as vaginal tearing. Allergic reactions to medicines. Despite the risks, benefits of vaginal delivery include less risk of bleeding and infection and a shorter recovery time compared to a Cesarean delivery. Cesarean delivery, or C-section, is the surgical delivery of a baby. What happens when I arrive at the birth center or hospital? Once you are in labor and have been admitted into the hospital or birth center, your health care team may: Review your pregnancy history and any concerns that you have. Talk with you about your birth plan and discuss pain control options. Check your blood pressure, breathing, and heartbeat. Assess your baby's heartbeat. Monitor your uterus for contractions. Check whether your bag of water (amniotic sac) has broken (ruptured). Insert an IV into one of your veins. This may be used to give you fluids and medicines. Monitoring Your health care team may assess your contractions (uterine monitoring) and your baby's heart rate (fetal monitoring). You may need to be monitored: Often, but not continuously (intermittently). All the time or for long periods at a time (continuously). Continuous monitoring may be needed if: You are taking certain medicines, such as medicine to relieve pain or make your contractions stronger. You have pregnancy or labor complications. Monitoring may be done by: Placing a special stethoscope or a handheld monitoring device on your abdomen to check your baby's heartbeat  and to check for contractions. Placing monitors on your abdomen (external monitors) to record your baby's heartbeat and the frequency and length of contractions. Placing monitors inside your uterus through your vagina (internal monitors) to record your baby's heartbeat and the frequency, length, and strength of your contractions. Depending on the type of monitor, it may remain in your uterus or on your baby's head until birth. Telemetry. This is a type of continuous monitoring that can be done with external or internal monitors. Instead of having to stay in bed, you are able to move around. Physical exam Your health care team may perform frequent physical exams. This may include: Checking how and where your baby is positioned in your uterus. Checking your cervix to determine: Whether it is thinning out (effacing). Whether it is opening up (dilating). What happens during labor and delivery? Normal labor and delivery is divided into the following three stages: Stage 1 This is the longest stage of labor. Throughout this stage, you will feel contractions. Contractions generally feel mild, infrequent, and irregular at first. They get stronger, more frequent, and more regular as you move through this stage. You may have contractions about every 2-3 minutes. This stage ends when your cervix is completely dilated to 4 inches (10 cm) and completely effaced. Stage 2 This stage starts once your cervix is completely effaced and dilated and lasts until the delivery of your baby. This is the stage where you will feel an urge to push your baby out of your vagina. You may feel stretching and burning pain, especially when the widest part of your baby's head passes through the vaginal opening (crowning). Once your baby is delivered, the umbilical cord will be clamped and  cut. Timing of cutting the cord will depend on your wishes, your baby's health, and your health care provider's practices. Your baby will be  placed on your bare chest (skin-to-skin contact) in an upright position and covered with a warm blanket. If you are choosing to breastfeed, watch your baby for feeding cues, like rooting or sucking, and help the baby to your breast for his or her first feeding. Stage 3 This stage starts immediately after the birth of your baby and ends after you deliver the placenta. This stage may take anywhere from 5 to 30 minutes. After your baby has been delivered, you will feel contractions as your body expels the placenta. These contractions also help your uterus get smaller and reduce bleeding. What can I expect after labor and delivery? After labor is over, you and your baby will be assessed closely until you are ready to go home. Your health care team will teach you how to care for yourself and your baby. You and your baby may be encouraged to stay in the same room (rooming in) during your hospital stay. This will help promote early bonding and successful breastfeeding. Your uterus will be checked and massaged regularly (fundal massage). You may continue to receive fluids and medicines through an IV. You will have some soreness and pain in your abdomen, vagina, and the area of skin between your vaginal opening and your anus (perineum). If an incision was made near your vagina (episiotomy) or if you had some vaginal tearing during delivery, cold compresses may be placed on your episiotomy or your tear. This helps to reduce pain and swelling. It is normal to have vaginal bleeding after delivery. Wear a sanitary pad for vaginal bleeding and discharge. Summary Vaginal delivery means that you will give birth by pushing your baby out of your birth canal (vagina). Your health care team will monitor you and your baby throughout the stages of labor. After you deliver your baby, your health care team will continue to assess you and your baby to ensure you are both recovering as expected after delivery. This  information is not intended to replace advice given to you by your health care provider. Make sure you discuss any questions you have with your health care provider. Document Revised: 10/29/2020 Document Reviewed: 10/29/2020 Elsevier Patient Education  2022 [employer].

## 2022-01-24 ENCOUNTER — Other Ambulatory Visit: Payer: Self-pay

## 2022-01-24 ENCOUNTER — Encounter (HOSPITAL_COMMUNITY): Payer: Self-pay | Admitting: Family Medicine

## 2022-01-24 ENCOUNTER — Inpatient Hospital Stay (EMERGENCY_DEPARTMENT_HOSPITAL)
Admission: AD | Admit: 2022-01-24 | Discharge: 2022-01-24 | Disposition: A | Discharge status: Home / Self Care | Payer: 59 | Source: Home / Self Care | Attending: Family Medicine | Admitting: Family Medicine

## 2022-01-24 ENCOUNTER — Ambulatory Visit: Payer: 59

## 2022-01-24 DIAGNOSIS — Z641 Problems related to multiparity: Secondary | ICD-10-CM

## 2022-01-24 DIAGNOSIS — O479 False labor, unspecified: Secondary | ICD-10-CM | POA: Diagnosis not present

## 2022-01-24 DIAGNOSIS — Z8759 Personal history of other complications of pregnancy, childbirth and the puerperium: Secondary | ICD-10-CM

## 2022-01-24 DIAGNOSIS — O09299 Supervision of pregnancy with other poor reproductive or obstetric history, unspecified trimester: Secondary | ICD-10-CM

## 2022-01-24 DIAGNOSIS — O09219 Supervision of pregnancy with history of pre-term labor, unspecified trimester: Secondary | ICD-10-CM | POA: Insufficient documentation

## 2022-01-24 DIAGNOSIS — O0990 Supervision of high risk pregnancy, unspecified, unspecified trimester: Secondary | ICD-10-CM

## 2022-01-24 DIAGNOSIS — O4703 False labor before 37 completed weeks of gestation, third trimester: Secondary | ICD-10-CM | POA: Insufficient documentation

## 2022-01-24 DIAGNOSIS — O09899 Supervision of other high risk pregnancies, unspecified trimester: Secondary | ICD-10-CM

## 2022-01-24 DIAGNOSIS — Z3A36 36 weeks gestation of pregnancy: Secondary | ICD-10-CM | POA: Insufficient documentation

## 2022-01-24 DIAGNOSIS — Z3689 Encounter for other specified antenatal screening: Secondary | ICD-10-CM

## 2022-01-24 NOTE — MAU Note (Signed)
Contractions started early this morning.  Was 3 cm yesterday when checked, membranes were  swept.  Ctxs have gotten closer and stronger. No bleeding or leaking. Loose stools last night.

## 2022-01-24 NOTE — MAU Provider Note (Signed)
S: Patient is here for RN labor evaluation. CNM at bedside per patient request to perform cervical exam. Fetal tracing, vital signs, & chart reviewed. After initial exam patient asked to speak to the CNM in private. She requests admission for IOL.  O:  Vitals:   01/24/22 1629 01/24/22 1649  BP: 132/83 (!) 118/96  Pulse: (!) 111   Resp: 18   Temp: 98.9 F (37.2 C)   TempSrc: Oral   SpO2: 98%   Weight: 114.4 kg   Height: 5\' 3"  (1.6 m)    No results found. However, due to the size of the patient record, not all encounters were searched. Please check Results Review for a complete set of results.  Dilation: 3 Effacement (%): Thick Cervical Position: Posterior Presentation: Vertex Exam by:: S. Aivan Fillingim,CNM   FHR: 135 bpm, Mod Var, No Decels, 15 x 15 Accels UC: Rare, UI noted   A: 1. Supervision of high risk pregnancy, antepartum   2. History of preterm delivery, currently pregnant   3. History of pre-eclampsia in prior pregnancy, currently pregnant   4. History of gestational hypertension   5. [place] multipara   6. False labor   7. Non-stress test reactive on fetal surveillance      P:  Patient requested recheck of cervix and discharge home ahead of planned 90 min evaluation Cervix unchanged at that time Cat I tracing Explained patient does not have indication for admission at this time Discharge home in stable condition per patient request  RN to discharge home in stable condition with return precautions & fetal kick counts  Darlina Rumpf, CNM 01/24/22 7:18 PM

## 2022-01-26 ENCOUNTER — Inpatient Hospital Stay (EMERGENCY_DEPARTMENT_HOSPITAL)
Admission: AD | Admit: 2022-01-26 | Discharge: 2022-01-26 | Disposition: A | Discharge status: Home / Self Care | Payer: 59 | Source: Home / Self Care | Attending: Obstetrics and Gynecology | Admitting: Obstetrics and Gynecology

## 2022-01-26 ENCOUNTER — Other Ambulatory Visit: Payer: Self-pay

## 2022-01-26 ENCOUNTER — Encounter (HOSPITAL_COMMUNITY): Payer: Self-pay | Admitting: Obstetrics and Gynecology

## 2022-01-26 ENCOUNTER — Telehealth: Payer: 59 | Admitting: Nurse Practitioner

## 2022-01-26 DIAGNOSIS — O4703 False labor before 37 completed weeks of gestation, third trimester: Secondary | ICD-10-CM | POA: Insufficient documentation

## 2022-01-26 DIAGNOSIS — Z3A36 36 weeks gestation of pregnancy: Secondary | ICD-10-CM | POA: Insufficient documentation

## 2022-01-26 DIAGNOSIS — O219 Vomiting of pregnancy, unspecified: Secondary | ICD-10-CM

## 2022-01-26 DIAGNOSIS — O479 False labor, unspecified: Secondary | ICD-10-CM

## 2022-01-26 MED ORDER — ONDANSETRON 4 MG PO TBDP: 4.0000 mg | ORAL_TABLET | Freq: Three times a day (TID) | ORAL | 0 refills | Status: DC | PRN

## 2022-01-26 MED ORDER — ONDANSETRON 4 MG PO TBDP: 4.0000 mg | ORAL_TABLET | Freq: Three times a day (TID) | ORAL | 3 refills | Status: AC | PRN

## 2022-01-26 NOTE — Patient Instructions (Signed)
[name], thank you for joining  J -Warren, NP for today's virtual visit.  While this provider is not your primary care provider (PCP), if your PCP is located in our provider database this encounter information will be shared with them immediately following your visit. ° °Consent: °(Patient) [name] provided verbal consent for this virtual visit at the beginning of the encounter. ° °Current Medications: ° °Current Outpatient Medications:  °  ondansetron (ZOFRAN-ODT) 4 MG disintegrating tablet, Take 1 tablet (4 mg total) by mouth every 8 (eight) hours as needed for up to 3 days for nausea or vomiting., Disp: 10 tablet, Rfl: 0 °  albuterol (VENTOLIN HFA) 108 (90 Base) MCG/ACT inhaler, Inhale 2 puffs into the lungs every 6 (six) hours as needed for wheezing or shortness of breath. (Patient not taking: Reported on 01/10/2022), Disp: 8 g, Rfl: 2 °  ARIPiprazole (ABILIFY IM), Inject 300 mg into the muscle every 30 (thirty) days., Disp: , Rfl:  °  Blood Pressure Monitoring (BLOOD PRESSURE KIT) DEVI, 1 Device by Does not apply route once a week., Disp: 1 each, Rfl: 0 °  doxylamine, Sleep, (UNISOM) 25 MG tablet, Take 25 mg by mouth at bedtime as needed., Disp: , Rfl:  °  famotidine (PEPCID) 20 MG tablet, Take 1 tablet (20 mg total) by mouth 2 (two) times daily as needed for heartburn or indigestion., Disp: 60 tablet, Rfl: 5 °  lamoTRIgine (LAMICTAL) 25 MG tablet, Take 25 mg by mouth daily., Disp: , Rfl:  °  Prenatal Vit-Fe Fumarate-FA (PRENATAL MULTIVITAMIN) TABS tablet, Take 1 tablet by mouth daily at 12 noon., Disp: , Rfl:   ° °Medications ordered in this encounter:  °Meds ordered this encounter  °Medications  ° ondansetron (ZOFRAN-ODT) 4 MG disintegrating tablet  °  Sig: Take 1 tablet (4 mg total) by mouth every 8 (eight) hours as needed for up to 3 days for nausea or vomiting.  °  Dispense:  10 tablet  °  Refill:  0  °  ° °*If you need refills on other medications prior to your next  appointment, please contact your pharmacy* ° °Follow-Up: °Call back or seek an in-person evaluation if the symptoms worsen or if the condition fails to improve as anticipated. ° °Other Instructions °Encouraged to follow-up with OB. Go to the ER for intractable nausea, vomiting or other concerns.  ° °If you have been instructed to have an in-person evaluation today at a local Urgent Care facility, please use the link below. It will take you to a list of all of our available [facility], including address, phone number and hours of operation. Please do not delay care.  °[facility] ° °If you or a family member do not have a primary care provider, use the link below to schedule a visit and establish care. When you choose a [facility] primary care physician or advanced practice provider, you gain a long-term partner in health. °Find a Primary Care Provider ° °Learn more about [facility]'s in-office and virtual care options: °[facility] Get Care Now' °

## 2022-01-26 NOTE — MAU Note (Signed)
Pt removed fetal heart monitors and states she would like to leave.

## 2022-01-26 NOTE — Progress Notes (Signed)
Virtual Visit Consent   [name], you are scheduled for a virtual visit with a [facility] provider today.     Just as with appointments in the office, your consent must be obtained to participate.  Your consent will be active for this visit and any virtual visit you may have with one of our providers in the next 365 days.     If you have a MyChart account, a copy of this consent can be sent to you electronically.  All virtual visits are billed to your insurance company just like a traditional visit in the office.    As this is a virtual visit, video technology does not allow for your provider to perform a traditional examination.  This may limit your provider's ability to fully assess your condition.  If your provider identifies any concerns that need to be evaluated in person or the need to arrange testing (such as labs, EKG, etc.), we will make arrangements to do so.     Although advances in technology are sophisticated, we cannot ensure that it will always work on either your end or our end.  If the connection with a video visit is poor, the visit may have to be switched to a telephone visit.  With either a video or telephone visit, we are not always able to ensure that we have a secure connection.     I need to obtain your verbal consent now.   Are you willing to proceed with your visit today? Yes   [name] has provided verbal consent on 01/26/2022 for a virtual visit (video or telephone).   [name], NP   Date: 01/26/2022 9:32 AM   Virtual Visit via Video Note   I, [name], connected with  [name]  ([id number], 1986-08-26) on 01/26/22 at  9:30 AM EST by a video-enabled telemedicine application and verified that I am speaking with the correct person using two identifiers.  Location: Patient: Virtual Visit Location Patient: Home Provider: Virtual Visit Location Provider: Home   I discussed the limitations of evaluation and  management by telemedicine and the availability of in person appointments. The patient expressed understanding and agreed to proceed.    History of Present Illness: [name] is a 36 y.o. who identifies as a female who was assigned female at birth, and is being seen today for pregnancy induced nausea and vomiting. Patient informs she is going to be induced on 01/27/22 36-37 weeks.  She has had hyperemesis intermittently throughout the pregnancy. She was previously prescribed Zofran per the patient, but she ran out. Denies any other symptoms such as abdominal pain, vaginal bleeding, spotting. She has contacted her OB, but has not received a return call at this time. [name]  HPI: HPI  Problems:  Patient Active Problem List   Diagnosis Date Noted   COVID-19 affecting puerperium 12/19/2021   Unwanted fertility 12/19/2021   Abnormal antenatal ultrasound 10/09/2021   Supervision of high risk pregnancy, antepartum 07/03/2021   History of preterm delivery, currently pregnant 07/03/2021   History of pre-eclampsia in prior pregnancy, currently pregnant 07/03/2021   History of gestational hypertension 07/03/2021   Grand multipara 07/03/2021   Gestational hypertension 03/16/2020   ADHD (attention deficit hyperactivity disorder), 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   Vaginal venereal warts 04/24/2015   Anxiety state 03/27/2014   OSA (obstructive sleep apnea) 09/15/2013   Asthma 05/21/2013   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   Intellectual disability 09/15/2012   Obesity (BMI  30-39.9) 08/03/2012   Bipolar affective disorder, depressed, moderate degree ([place]) 06/02/2012    Class: Acute   GERD (gastroesophageal reflux disease) 05/19/2012   Borderline behavior 04/12/2012    Class: Acute    Allergies:  Allergies  Allergen Reactions   Depakote [Divalproex Sodium] Other (See Comments)    Pt states that this medication makes her blood levels toxic.     Methylphenidate Derivatives Other (See Comments)     Reaction:  Depression and anger    Neurontin [Gabapentin] Other (See Comments)    Reaction:  Dizziness    Prozac [Fluoxetine Hcl] Other (See Comments)    Reaction:  Anger    Methylphenidate Hcl     SAME AS PREVIOUS LISTED METHYLPHENIDATE   Medications:  Current Outpatient Medications:    albuterol (VENTOLIN HFA) 108 (90 Base) MCG/ACT inhaler, Inhale 2 puffs into the lungs every 6 (six) hours as needed for wheezing or shortness of breath. (Patient not taking: Reported on 01/10/2022), Disp: 8 g, Rfl: 2   ARIPiprazole (ABILIFY IM), Inject 300 mg into the muscle every 30 (thirty) days., Disp: , Rfl:    Blood Pressure Monitoring (BLOOD PRESSURE KIT) DEVI, 1 Device by Does not apply route once a week., Disp: 1 each, Rfl: 0   doxylamine, Sleep, (UNISOM) 25 MG tablet, Take 25 mg by mouth at bedtime as needed., Disp: , Rfl:    famotidine (PEPCID) 20 MG tablet, Take 1 tablet (20 mg total) by mouth 2 (two) times daily as needed for heartburn or indigestion., Disp: 60 tablet, Rfl: 5   lamoTRIgine (LAMICTAL) 25 MG tablet, Take 25 mg by mouth daily., Disp: , Rfl:    Prenatal Vit-Fe Fumarate-FA (PRENATAL MULTIVITAMIN) TABS tablet, Take 1 tablet by mouth daily at 12 noon., Disp: , Rfl:   Observations/Objective: Patient is well-developed, well-nourished in no acute distress.  Resting comfortably at home.  Head is normocephalic, atraumatic.  No labored breathing.  Speech is clear and coherent with logical content.  Patient is alert and oriented at baseline.    Assessment and Plan: 1. Nausea and vomiting during pregnancy - ondansetron (ZOFRAN-ODT) 4 MG disintegrating tablet; Take 1 tablet (4 mg total) by mouth every 8 (eight) hours as needed for up to 3 days for nausea or vomiting.  Dispense: 10 tablet; Refill: 0  Symptoms consistent with nausea/vomiting related to pregnancy. Denies any abdominal pain, vaginal bleeding or spotting at this time. Encouraged to follow-up with OB. Go to the ER for intractable  nausea, vomiting or other concerns.   Follow Up Instructions: I discussed the assessment and treatment plan with the patient. The patient was provided an opportunity to ask questions and all were answered. The patient agreed with the plan and demonstrated an understanding of the instructions.  A copy of instructions were sent to the patient via MyChart unless otherwise noted below.    The patient was advised to call back or seek an in-person evaluation if the symptoms worsen or if the condition fails to improve as anticipated.  Time:  I spent 12 minutes with the patient via telehealth technology discussing the above problems/concerns.    [name], NP

## 2022-01-26 NOTE — MAU Note (Signed)
Lost her plug at 0200, contractions started around 0500, have been consistent ever since then. No leaking. Reports +fm. Spotting.

## 2022-01-27 ENCOUNTER — Inpatient Hospital Stay (HOSPITAL_COMMUNITY)
Admission: AD | Admit: 2022-01-27 | Discharge: 2022-01-29 | DRG: 807 | Disposition: A | Discharge status: Home / Self Care | Payer: 59 | Attending: Family Medicine | Admitting: Family Medicine

## 2022-01-27 ENCOUNTER — Encounter (HOSPITAL_COMMUNITY): Payer: Self-pay | Admitting: Obstetrics & Gynecology

## 2022-01-27 ENCOUNTER — Inpatient Hospital Stay (HOSPITAL_COMMUNITY): Payer: 59 | Admitting: Anesthesiology

## 2022-01-27 ENCOUNTER — Other Ambulatory Visit: Payer: Self-pay

## 2022-01-27 DIAGNOSIS — O99214 Obesity complicating childbirth: Secondary | ICD-10-CM | POA: Diagnosis present

## 2022-01-27 DIAGNOSIS — O368130 Decreased fetal movements, third trimester, not applicable or unspecified: Secondary | ICD-10-CM | POA: Diagnosis present

## 2022-01-27 DIAGNOSIS — K219 Gastro-esophageal reflux disease without esophagitis: Secondary | ICD-10-CM | POA: Diagnosis present

## 2022-01-27 DIAGNOSIS — O139 Gestational [pregnancy-induced] hypertension without significant proteinuria, unspecified trimester: Secondary | ICD-10-CM | POA: Diagnosis present

## 2022-01-27 DIAGNOSIS — O99344 Other mental disorders complicating childbirth: Secondary | ICD-10-CM | POA: Diagnosis present

## 2022-01-27 DIAGNOSIS — O403XX0 Polyhydramnios, third trimester, not applicable or unspecified: Secondary | ICD-10-CM | POA: Diagnosis present

## 2022-01-27 DIAGNOSIS — O283 Abnormal ultrasonic finding on antenatal screening of mother: Secondary | ICD-10-CM | POA: Diagnosis present

## 2022-01-27 DIAGNOSIS — F319 Bipolar disorder, unspecified: Secondary | ICD-10-CM | POA: Diagnosis present

## 2022-01-27 DIAGNOSIS — Z8759 Personal history of other complications of pregnancy, childbirth and the puerperium: Secondary | ICD-10-CM

## 2022-01-27 DIAGNOSIS — F3132 Bipolar disorder, current episode depressed, moderate: Secondary | ICD-10-CM | POA: Diagnosis present

## 2022-01-27 DIAGNOSIS — O09899 Supervision of other high risk pregnancies, unspecified trimester: Secondary | ICD-10-CM

## 2022-01-27 DIAGNOSIS — J45909 Unspecified asthma, uncomplicated: Secondary | ICD-10-CM | POA: Diagnosis present

## 2022-01-27 DIAGNOSIS — O0990 Supervision of high risk pregnancy, unspecified, unspecified trimester: Secondary | ICD-10-CM

## 2022-01-27 DIAGNOSIS — O09299 Supervision of pregnancy with other poor reproductive or obstetric history, unspecified trimester: Secondary | ICD-10-CM

## 2022-01-27 DIAGNOSIS — O133 Gestational [pregnancy-induced] hypertension without significant proteinuria, third trimester: Secondary | ICD-10-CM

## 2022-01-27 DIAGNOSIS — O9952 Diseases of the respiratory system complicating childbirth: Secondary | ICD-10-CM | POA: Diagnosis present

## 2022-01-27 DIAGNOSIS — F79 Unspecified intellectual disabilities: Secondary | ICD-10-CM | POA: Diagnosis present

## 2022-01-27 DIAGNOSIS — E669 Obesity, unspecified: Secondary | ICD-10-CM | POA: Diagnosis present

## 2022-01-27 DIAGNOSIS — Z3A36 36 weeks gestation of pregnancy: Secondary | ICD-10-CM

## 2022-01-27 DIAGNOSIS — Z8616 Personal history of COVID-19: Secondary | ICD-10-CM

## 2022-01-27 DIAGNOSIS — Z3009 Encounter for other general counseling and advice on contraception: Secondary | ICD-10-CM | POA: Diagnosis present

## 2022-01-27 DIAGNOSIS — Z20822 Contact with and (suspected) exposure to covid-19: Secondary | ICD-10-CM | POA: Diagnosis present

## 2022-01-27 DIAGNOSIS — O134 Gestational [pregnancy-induced] hypertension without significant proteinuria, complicating childbirth: Principal | ICD-10-CM | POA: Diagnosis present

## 2022-01-27 DIAGNOSIS — O9962 Diseases of the digestive system complicating childbirth: Secondary | ICD-10-CM | POA: Diagnosis present

## 2022-01-27 DIAGNOSIS — Z641 Problems related to multiparity: Secondary | ICD-10-CM

## 2022-01-27 LAB — RESP PANEL BY RT-PCR (FLU A&B, COVID) ARPGX2
Influenza A by PCR: NEGATIVE
Influenza B by PCR: NEGATIVE
SARS Coronavirus 2 by RT PCR: NEGATIVE

## 2022-01-27 LAB — TYPE AND SCREEN
ABO/RH(D): O POS
Antibody Screen: NEGATIVE

## 2022-01-27 LAB — URINALYSIS, ROUTINE W REFLEX MICROSCOPIC
Glucose, UA: NEGATIVE mg/dL
Hgb urine dipstick: NEGATIVE
Ketones, ur: 5 mg/dL — AB
Nitrite: NEGATIVE
Protein, ur: 100 mg/dL — AB
Specific Gravity, Urine: 1.028 (ref 1.005–1.030)
pH: 5 (ref 5.0–8.0)

## 2022-01-27 LAB — CBC
HCT: 35.1 % — ABNORMAL LOW (ref 36.0–46.0)
Hemoglobin: 11.2 g/dL — ABNORMAL LOW (ref 12.0–15.0)
MCH: 28.2 pg (ref 26.0–34.0)
MCHC: 31.9 g/dL (ref 30.0–36.0)
MCV: 88.4 fL (ref 80.0–100.0)
Platelets: 157 10*3/uL (ref 150–400)
RBC: 3.97 MIL/uL (ref 3.87–5.11)
RDW: 14.6 % (ref 11.5–15.5)
WBC: 7.4 10*3/uL (ref 4.0–10.5)
nRBC: 0.3 % — ABNORMAL HIGH (ref 0.0–0.2)

## 2022-01-27 LAB — COMPREHENSIVE METABOLIC PANEL
ALT: 8 U/L (ref 0–44)
AST: 20 U/L (ref 15–41)
Albumin: 2.7 g/dL — ABNORMAL LOW (ref 3.5–5.0)
Alkaline Phosphatase: 135 U/L — ABNORMAL HIGH (ref 38–126)
Anion gap: 11 (ref 5–15)
BUN: 7 mg/dL (ref 6–20)
CO2: 20 mmol/L — ABNORMAL LOW (ref 22–32)
Calcium: 8.4 mg/dL — ABNORMAL LOW (ref 8.9–10.3)
Chloride: 103 mmol/L (ref 98–111)
Creatinine, Ser: 0.79 mg/dL (ref 0.44–1.00)
GFR, Estimated: >60 mL/min (ref >60)
Glucose, Bld: 111 mg/dL — ABNORMAL HIGH (ref 70–99)
Potassium: 3.7 mmol/L (ref 3.5–5.1)
Sodium: 134 mmol/L — ABNORMAL LOW (ref 135–145)
Total Bilirubin: 0.9 mg/dL (ref 0.3–1.2)
Total Protein: 5.6 g/dL — ABNORMAL LOW (ref 6.5–8.1)

## 2022-01-27 LAB — CULTURE, BETA STREP (GROUP B ONLY): Strep Gp B Culture: NEGATIVE

## 2022-01-27 MED ORDER — LACTATED RINGERS IV SOLN: 500.0000 mL | INTRAVENOUS | Status: DC | PRN

## 2022-01-27 MED ORDER — EPHEDRINE 5 MG/ML INJ: 10.0000 mg | INTRAVENOUS | Status: DC | PRN

## 2022-01-27 MED ORDER — SOD CITRATE-CITRIC ACID 500-334 MG/5ML PO SOLN
30.0000 mL | ORAL | Status: DC | PRN
  Filled 2022-01-27: qty 30

## 2022-01-27 MED ORDER — OXYTOCIN BOLUS FROM INFUSION: 333.0000 mL | Freq: Once | INTRAVENOUS | Status: DC

## 2022-01-27 MED ORDER — PHENYLEPHRINE 40 MCG/ML (10ML) SYRINGE FOR IV PUSH (FOR BLOOD PRESSURE SUPPORT): 80.0000 ug | PREFILLED_SYRINGE | INTRAVENOUS | Status: DC | PRN

## 2022-01-27 MED ORDER — LACTATED RINGERS IV SOLN
500.0000 mL | Freq: Once | INTRAVENOUS | Status: AC
  Administered 2022-01-27: 500 mL via INTRAVENOUS

## 2022-01-27 MED ORDER — LIDOCAINE HCL (PF) 1 % IJ SOLN: 30.0000 mL | INTRAMUSCULAR | Status: DC | PRN

## 2022-01-27 MED ORDER — FAMOTIDINE 20 MG PO TABS
20.0000 mg | ORAL_TABLET | Freq: Every day | ORAL | Status: DC
  Administered 2022-01-27 – 2022-01-28 (×2): 20 mg via ORAL
  Filled 2022-01-27 (×2): qty 1

## 2022-01-27 MED ORDER — OXYTOCIN-SODIUM CHLORIDE 30-0.9 UT/500ML-% IV SOLN
2.5000 [IU]/h | INTRAVENOUS | Status: DC
  Filled 2022-01-27: qty 500

## 2022-01-27 MED ORDER — LAMOTRIGINE 25 MG PO TABS
25.0000 mg | ORAL_TABLET | Freq: Every day | ORAL | Status: DC
  Administered 2022-01-27 – 2022-01-29 (×2): 25 mg via ORAL
  Filled 2022-01-27 (×4): qty 1

## 2022-01-27 MED ORDER — FENTANYL CITRATE (PF) 100 MCG/2ML IJ SOLN
50.0000 ug | INTRAMUSCULAR | Status: DC | PRN
  Administered 2022-01-27: 50 ug via INTRAVENOUS
  Filled 2022-01-27 (×2): qty 2

## 2022-01-27 MED ORDER — LACTATED RINGERS IV SOLN: INTRAVENOUS | Status: DC

## 2022-01-27 MED ORDER — ACETAMINOPHEN 325 MG PO TABS: 650.0000 mg | ORAL_TABLET | ORAL | Status: DC | PRN

## 2022-01-27 MED ORDER — ONDANSETRON HCL 4 MG/2ML IJ SOLN
4.0000 mg | Freq: Four times a day (QID) | INTRAMUSCULAR | Status: DC | PRN
  Administered 2022-01-28: 4 mg via INTRAVENOUS
  Filled 2022-01-27: qty 2

## 2022-01-27 MED ORDER — LIDOCAINE HCL (PF) 1 % IJ SOLN
INTRAMUSCULAR | Status: DC | PRN
  Administered 2022-01-27: 8 mL via EPIDURAL

## 2022-01-27 MED ORDER — DIPHENHYDRAMINE HCL 50 MG/ML IJ SOLN: 12.5000 mg | INTRAMUSCULAR | Status: DC | PRN

## 2022-01-27 MED ORDER — TERBUTALINE SULFATE 1 MG/ML IJ SOLN: 0.2500 mg | Freq: Once | INTRAMUSCULAR | Status: DC | PRN

## 2022-01-27 MED ORDER — FENTANYL-BUPIVACAINE-NACL 0.5-0.125-0.9 MG/250ML-% EP SOLN
12.0000 mL/h | EPIDURAL | Status: DC | PRN
  Administered 2022-01-27: 12 mL/h via EPIDURAL
  Filled 2022-01-27: qty 250

## 2022-01-27 MED ORDER — OXYTOCIN-SODIUM CHLORIDE 30-0.9 UT/500ML-% IV SOLN
1.0000 m[IU]/min | INTRAVENOUS | Status: DC
  Administered 2022-01-27: 2 m[IU]/min via INTRAVENOUS
  Administered 2022-01-28: 20 m[IU]/min via INTRAVENOUS

## 2022-01-27 MED ORDER — ARIPIPRAZOLE 10 MG PO TABS
30.0000 mg | ORAL_TABLET | Freq: Every day | ORAL | Status: DC
  Administered 2022-01-27 – 2022-01-29 (×2): 30 mg via ORAL
  Filled 2022-01-27 (×4): qty 3

## 2022-01-27 NOTE — Anesthesia Preprocedure Evaluation (Addendum)
Anesthesia Evaluation  Patient identified by MRN, date of birth, ID band Patient awake    Reviewed: Allergy & Precautions, NPO status , Patient's Chart, lab work & pertinent test results  Airway Mallampati: III  TM Distance: >3 FB Neck ROM: Full    Dental no notable dental hx.    Pulmonary asthma , sleep apnea ,    Pulmonary exam normal breath sounds clear to auscultation       Cardiovascular hypertension, Normal cardiovascular exam Rhythm:Regular Rate:Normal     Neuro/Psych PSYCHIATRIC DISORDERS Anxiety Depression Bipolar Disorder Cognitive delay    GI/Hepatic Neg liver ROS, GERD  Medicated and Controlled,  Endo/Other  Morbid obesity (BMI 45)  Renal/GU negative Renal ROS  negative genitourinary   Musculoskeletal negative musculoskeletal ROS (+)   Abdominal   Peds  (+) ADHD Hematology negative hematology ROS (+)   Anesthesia Other Findings   Reproductive/Obstetrics (+) Pregnancy                            Anesthesia Physical Anesthesia Plan  ASA: 4  Anesthesia Plan: Epidural   Post-op Pain Management:    Induction:   PONV Risk Score and Plan: 2  Airway Management Planned: Natural Airway  Additional Equipment: None  Intra-op Plan:   Post-operative Plan:   Informed Consent: I have reviewed the patients History and Physical, chart, labs and discussed the procedure including the risks, benefits and alternatives for the proposed anesthesia with the patient or authorized representative who has indicated his/her understanding and acceptance.       Plan Discussed with: Anesthesiologist  Anesthesia Plan Comments:         Anesthesia Quick Evaluation

## 2022-01-27 NOTE — H&P (Signed)
LABOR AND DELIVERY ADMISSION HISTORY AND PHYSICAL NOTE  [name] is a 36 y.o. female [phone] with IUP at 47w6dby LMP c/w first trimester ultrasound presenting for IOL 2/2 GHTN. She reports positive fetal movement. She denies leakage of fluid, vaginal bleeding. She reports irregular contractions, declines pain medication.   She plans on bottle feeding. Her contraception plan is: BTL.  P[facility] History/Complications[facility] , C[facility], normal anatomy, cephalic presentation, anterior placenta, 92%ile, EFW [id number] Pregnancy complications:  Patient Active Problem List   Diagnosis Date Noted   Polyhydramnios affecting pregnancy in third trimester 01/27/2022   COVID-19 affecting puerperium 12/19/2021   Unwanted fertility 12/19/2021   Abnormal antenatal ultrasound 10/09/2021   Supervision of high risk pregnancy, antepartum 07/03/2021   History of preterm delivery, currently pregnant 07/03/2021   History of pre-eclampsia in prior pregnancy, currently pregnant 07/03/2021   History of gestational hypertension 07/03/2021   Grand multipara 07/03/2021   Gestational hypertension 03/16/2020   ADHD (attention deficit hyperactivity disorder), 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   Vaginal venereal warts 04/24/2015   Anxiety state 03/27/2014   OSA (obstructive sleep apnea) 09/15/2013   Asthma 05/21/2013   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   Intellectual disability 09/15/2012   Obesity (BMI 30-39.9) 08/03/2012   Bipolar affective disorder, depressed, moderate degree ([place] 06/02/2012    Class: Acute   GERD (gastroesophageal reflux disease) 05/19/2012   Borderline behavior 04/12/2012    Class: Acute   Nursing Staff Provider  Office Location  [facility] Dating   LMP consistent with 1st trimester [place] Language   English Anatomy [place] Echogenic Bowel   Flu Vaccine   Genetic/Carrier Screen  NIPS: WNL   AFP:   Not done Horizon:normal  TDaP Vaccine   Declined Hgb A1C or  GTT Early -  normal Third trimester - normal  COVID Vaccine  Yes   LAB RESULTS   Rhogam  n/a Blood Type O/Positive/-- (08/22 1358)   Baby Feeding Plan  Bottle Antibody Negative (08/22 1358)  Contraception BTL Rubella 2.45 (08/22 1358)  Circumcision If boy, Yes RPR Non Reactive (08/22 1358)   PH[place]ef[place] Children  HBsAg Negative (08/22 1358)   Support Person  [name] HCVAb neg  Prenatal Classes  HIV Non Reactive (08/22 1358)     BTL Consent  GBS    negative  VBAC Consent  Pap 07/26/20 NILM, neg HPV     CF NEG (2019)  DME Rx _1  BP cuff- has a cuff _2  Weight Scale- has a scale Waterbirth  _3  Class _4  Consent _5  CNM visit  PHQ9 & GAD7 [  ] new OB [  ] 28 weeks  [  ] 36 weeks Induction  _6  Orders Entered _7 Foley Y/N    Past Medical History: Past Medical History:  Diagnosis Date   Anxiety    Asthma    inhaler PRN-hasn't used inhaler in months   Attention deficit hyperactivity disorder    Bipolar disorder ([place]    hospitalized as a teen for suicidal ideations and cutting   Depression    no complaints, doing well   Gastritis    GERD (gastroesophageal reflux disease)    History of gestational diabetes in prior pregnancy, currently pregnant 10/13/2017   History of preterm delivery after IOL for preeclampsia, currently pregnant 10/13/2017   Not a 17P candidate   Hx of varicella    Mild preeclampsia 02/09/2016   Nausea and vomiting during  pregnancy 10/04/2019   Vaginal Pap smear, abnormal     Past Surgical History: Past Surgical History:  Procedure Laterality Date   DILATION AND EVACUATION N/A 01/08/2021   Procedure: DILATATION AND EVACUATION;  Surgeon: Chancy Milroy, MD;  Location: [facility];  Service: Gynecology;  Laterality: N/A;   EYE MUSCLE SURGERY Bilateral    07/29/12   MOUTH SURGERY     PLANTAR FASCIA SURGERY     WISDOM TOOTH EXTRACTION      Obstetrical History: OB History     Gravida  8   Para  5   Term  4   Preterm  1    AB  2   Living  5      SAB  2   IAB      Ectopic      Multiple  0   Live Births  5           Social History: Social History   Socioeconomic History   Marital status: Married    Spouse name: Not on file   Number of children: 0   Years of education: Not on file   Highest education level: Not on file  Occupational History   Not on file  Tobacco Use   Smoking status: Never   Smokeless tobacco: Never  Vaping Use   Vaping Use: Never used  Substance and Sexual Activity   Alcohol use: No    Alcohol/week: 0.0 standard drinks   Drug use: No   Sexual activity: Yes    Birth control/protection: None  Other Topics Concern   Not on file  Social History Narrative   Not on file   Social Determinants of Health   Financial Resource Strain: Not on file  Food Insecurity: No Food Insecurity   Worried About Running Out of Food in the Last Year: Never true   Ran Out of Food in the Last Year: Never true  Transportation Needs: No Transportation Needs   Lack of Transportation (Medical): No   Lack of Transportation (Non-Medical): No  Physical Activity: Not on file  Stress: Not on file  Social Connections: Not on file    Family History: Family History  Problem Relation Age of Onset   Depression Mother    Colon cancer Mother    Colon polyps Mother    Depression Father    Lung cancer Father    Depression Brother    Liver disease Other    Kidney disease Other     Allergies: Allergies  Allergen Reactions   Depakote [Divalproex Sodium] Other (See Comments)    Pt states that this medication makes her blood levels toxic.     Methylphenidate Derivatives Other (See Comments)    Reaction:  Depression and anger    Neurontin [Gabapentin] Other (See Comments)    Reaction:  Dizziness    Prozac [Fluoxetine Hcl] Other (See Comments)    Reaction:  Anger    Methylphenidate Hcl     SAME AS PREVIOUS LISTED METHYLPHENIDATE    Medications Prior to Admission  Medication Sig  Dispense Refill Last Dose   doxylamine, Sleep, (UNISOM) 25 MG tablet Take 25 mg by mouth at bedtime as needed.   Past Week   famotidine (PEPCID) 20 MG tablet Take 1 tablet (20 mg total) by mouth 2 (two) times daily as needed for heartburn or indigestion. 60 tablet 5 01/26/2022   lamoTRIgine (LAMICTAL) 25 MG tablet Take 25 mg by mouth daily.   01/26/2022  ondansetron (ZOFRAN-ODT) 4 MG disintegrating tablet Take 1 tablet (4 mg total) by mouth every 8 (eight) hours as needed for up to 27 days for nausea or vomiting. 20 tablet 3 01/26/2022   Prenatal Vit-Fe Fumarate-FA (PRENATAL MULTIVITAMIN) TABS tablet Take 1 tablet by mouth daily at 12 noon.   01/26/2022   albuterol (VENTOLIN HFA) 108 (90 Base) MCG/ACT inhaler Inhale 2 puffs into the lungs every 6 (six) hours as needed for wheezing or shortness of breath. (Patient not taking: Reported on 01/10/2022) 8 g 2    ARIPiprazole (ABILIFY IM) Inject 300 mg into the muscle every 30 (thirty) days.      Blood Pressure Monitoring (BLOOD PRESSURE KIT) DEVI 1 Device by Does not apply route once a week. 1 each 0      Review of Systems  All systems reviewed and negative except as stated in HPI  Physical Exam Blood pressure 113/68, pulse (!) 101, temperature 98.7 F (37.1 C), temperature source Oral, resp. rate 18, height _0  (1.6 m), weight 115.3 kg, last menstrual period 05/14/2021, SpO2 99 %, not currently breastfeeding. General appearance: alert, oriented, NAD Lungs: normal respiratory effort Heart: regular rate Abdomen: soft, non-tender; gravid, leopolds  Extremities: No calf swelling or tenderness Presentation: cephalic by RN exam in MAU  Fetal monitoringBaseline: 125 bpm, Variability: Good {> 6 bpm), Accelerations: Reactive, and Decelerations: Absent Uterine activity: uterine irritability  Dilation: 3 Effacement (%): 50 Station: -2 Exam by:: weston,rn  Prenatal labs: ABO, Rh: O/Positive/-- (08/22 1358) Antibody: Negative (08/22 1358) Rubella:  2.45 (08/22 1358) RPR: Non Reactive (12/13 0927)  HBsAg: Negative (08/22 1358)  HIV: Non Reactive (12/13 0927)  GC/Chlamydia: Neg  GBS: Negative/-- (02/09 1640)  2-hr GTT: WNL Genetic screening:  Low risk NIPS Anatomy US: Echogenic bowel, mild polyhydramnios (28 cm)  Prenatal Transfer Tool  Maternal Diabetes: No Genetic Screening: Normal Maternal Ultrasounds/Referrals: Echogenic bowel Fetal Ultrasounds or other Referrals:  Referred to Materal Fetal Medicine  Maternal Substance Abuse:  No Significant Maternal Medications:  Meds include: Other:  Abilify 30 mg and Lamictal 25 mg daily at 2100 Significant Maternal Lab Results: Group B Strep negative  Results for orders placed or performed during the hospital encounter of 01/27/22 (from the past 24 hour(s))  Urinalysis, Routine w reflex microscopic Urine, Clean Catch   Collection Time: 01/27/22  6:16 PM  Result Value Ref Range   Color, Urine AMBER (A) YELLOW   APPearance HAZY (A) CLEAR   Specific Gravity, Urine 1.028 1.005 - 1.030   pH 5.0 5.0 - 8.0   Glucose, UA NEGATIVE NEGATIVE mg/dL   Hgb urine dipstick NEGATIVE NEGATIVE   Bilirubin Urine SMALL (A) NEGATIVE   Ketones, ur 5 (A) NEGATIVE mg/dL   Protein, ur 100 (A) NEGATIVE mg/dL   Nitrite NEGATIVE NEGATIVE   Leukocytes,Ua SMALL (A) NEGATIVE   RBC / HPF 0-5 0 - 5 RBC/hpf   WBC, UA 0-5 0 - 5 WBC/hpf   Bacteria, UA RARE (A) NONE SEEN   Squamous Epithelial / LPF 6-10 0 - 5   Mucus PRESENT   CBC   Collection Time: 01/27/22  7:20 PM  Result Value Ref Range   WBC 7.4 4.0 - 10.5 K/uL   RBC 3.97 3.87 - 5.11 MIL/uL   Hemoglobin 11.2 (L) 12.0 - 15.0 g/dL   HCT 35.1 (L) 36.0 - 46.0 %   MCV 88.4 80.0 - 100.0 fL   MCH 28.2 26.0 - 34.0 pg   MCHC 31.9 30.0 - 36.0 g/dL  RDW 14.6 11.5 - 15.5 %   Platelets 157 150 - 400 K/uL   nRBC 0.3 (H) 0.0 - 0.2 %   *Note: Due to a large number of results and/or encounters for the requested time period, some results have not been displayed. A  complete set of results can be found in Results Review.    Patient Active Problem List   Diagnosis Date Noted   COVID-19 affecting puerperium 12/19/2021   Unwanted fertility 12/19/2021   Abnormal antenatal ultrasound 10/09/2021   Supervision of high risk pregnancy, antepartum 07/03/2021   History of preterm delivery, currently pregnant 07/03/2021   History of pre-eclampsia in prior pregnancy, currently pregnant 07/03/2021   History of gestational hypertension 07/03/2021   Grand multipara 07/03/2021   Gestational hypertension 03/16/2020   ADHD (attention deficit hyperactivity disorder), 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   Vaginal venereal warts 04/24/2015   Anxiety state 03/27/2014   OSA (obstructive sleep apnea) 09/15/2013   Asthma 05/21/2013   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 [id number] at 18w6dhere for IOL  #Labor: Will initiate Pitocin 2 x 2 once patient eats light labor dinner  #Pain: IV pain meds PRN, epidural upon request in active labor #FWB: Cat I tracing #GBS/ID: Negative #COVID: swab pending #MOF: Bottle #MOC: BTL (consent signed 11/26/2021) #Circ: N/A #Home Meds: Lamictal and Abilify ordered for 2100 administration per patient request. Discussed with patient she should not self administer from her outpatient containers  On admission patient reports that she continues to desire BTL but will make alternate plans if the procedure requires prolonged separation from her newborn  SDarlina Rumpf MOsgood MSN, CNM 01/27/2022, 8:55 PM

## 2022-01-27 NOTE — Progress Notes (Signed)
[name] [id number] at [redacted]w[redacted]d   Cat I tracing Vertex presentation confirmed with BSUS Initiate Pitocin 2 x 2  Mallie Snooks, MSA, MSN, CNM Certified Nurse Midwife, [employer] for [employer], [place]

## 2022-01-27 NOTE — MAU Note (Signed)
[name] is a 36 y.o. at [redacted]w[redacted]d here in MAU reporting: constant upper abdominal pain for the past 3 hours. No bleeding. No LOF. States no FM today but knows the baby is okay.  Onset of complaint: today  Pain score: 9/10  Vitals:   01/27/22 1810  BP: 118/84  Pulse: 95  Resp: 18  Temp: 98.7 F (37.1 C)  SpO2: 99%     FHT:160  Lab orders placed from triage: UA

## 2022-01-27 NOTE — Progress Notes (Signed)
Patient ID: [name], female   DOB: 1986-11-26, 36 y.o.   MRN: [id number]  Report give to Dr. Darrelyn Hillock who assumes care of patient at this time  Mallie Snooks, [place], MSN, CNM Certified Nurse Midwife, [facility] for [employer], [place] 01/27/22 11:53 PM

## 2022-01-27 NOTE — MAU Provider Note (Signed)
History     CSN: [id number]  Arrival date and time: 01/27/22 1744     Chief Complaint  Patient presents with   Abdominal Pain   Decreased Fetal Movement   HPI This is a 36 year old G8 P4-1-2-5 at 36 weeks and 6 days with a pregnancy complicated by cognitive delay, bipolar disorder, GERD, mild preeclampsia.  She is scheduled for induction tonight, however she comes in with right upper quadrant and middle abdominal pain with decreased fetal movement that happened earlier today.  OB History     Gravida  8   Para  5   Term  4   Preterm  1   AB  2   Living  5      SAB  2   IAB      Ectopic      Multiple  0   Live Births  5           Past Medical History:  Diagnosis Date   Anxiety    Asthma    inhaler PRN-hasn't used inhaler in months   Attention deficit hyperactivity disorder    Bipolar disorder [facility])    hospitalized as a teen for suicidal ideations and cutting   Depression    no complaints, doing well   Gastritis    GERD (gastroesophageal reflux disease)    History of gestational diabetes in prior pregnancy, currently pregnant 10/13/2017   History of preterm delivery after IOL for preeclampsia, currently pregnant 10/13/2017   Not a 17P candidate   Hx of varicella    Mild preeclampsia 02/09/2016   Nausea and vomiting during pregnancy 10/04/2019   Vaginal Pap smear, abnormal     Past Surgical History:  Procedure Laterality Date   DILATION AND EVACUATION N/A 01/08/2021   Procedure: DILATATION AND EVACUATION;  Surgeon: Chancy Milroy, MD;  Location: [facility];  Service: Gynecology;  Laterality: N/A;   EYE MUSCLE SURGERY Bilateral    07/29/12   MOUTH SURGERY     PLANTAR FASCIA SURGERY     WISDOM TOOTH EXTRACTION      Family History  Problem Relation Age of Onset   Depression Mother    Colon cancer Mother    Colon polyps Mother    Depression Father    Lung cancer Father    Depression Brother    Liver disease Other     Kidney disease Other     Social History   Tobacco Use   Smoking status: Never   Smokeless tobacco: Never  Vaping Use   Vaping Use: Never used  Substance Use Topics   Alcohol use: No    Alcohol/week: 0.0 standard drinks   Drug use: No    Allergies:  Allergies  Allergen Reactions   Depakote [Divalproex Sodium] Other (See Comments)    Pt states that this medication makes her blood levels toxic.     Methylphenidate Derivatives Other (See Comments)    Reaction:  Depression and anger    Neurontin [Gabapentin] Other (See Comments)    Reaction:  Dizziness    Prozac [Fluoxetine Hcl] Other (See Comments)    Reaction:  Anger    Methylphenidate Hcl     SAME AS PREVIOUS LISTED METHYLPHENIDATE    Medications Prior to Admission  Medication Sig Dispense Refill Last Dose   famotidine (PEPCID) 20 MG tablet Take 1 tablet (20 mg total) by mouth 2 (two) times daily as needed for heartburn or indigestion. 60 tablet 5 01/26/2022   lamoTRIgine (  LAMICTAL) 25 MG tablet Take 25 mg by mouth daily.   01/26/2022   ondansetron (ZOFRAN-ODT) 4 MG disintegrating tablet Take 1 tablet (4 mg total) by mouth every 8 (eight) hours as needed for up to 27 days for nausea or vomiting. 20 tablet 3 01/26/2022   Prenatal Vit-Fe Fumarate-FA (PRENATAL MULTIVITAMIN) TABS tablet Take 1 tablet by mouth daily at 12 noon.   01/26/2022   albuterol (VENTOLIN HFA) 108 (90 Base) MCG/ACT inhaler Inhale 2 puffs into the lungs every 6 (six) hours as needed for wheezing or shortness of breath. (Patient not taking: Reported on 01/10/2022) 8 g 2    ARIPiprazole (ABILIFY IM) Inject 300 mg into the muscle every 30 (thirty) days.      Blood Pressure Monitoring (BLOOD PRESSURE KIT) DEVI 1 Device by Does not apply route once a week. 1 each 0    doxylamine, Sleep, (UNISOM) 25 MG tablet Take 25 mg by mouth at bedtime as needed.       Review of Systems Physical Exam   Blood pressure 118/84, pulse 95, temperature 98.7 F (37.1 C), temperature  source Oral, resp. rate 18, height 5' 3"  (1.6 m), weight 115.3 kg, last menstrual period 05/14/2021, SpO2 99 %, not currently breastfeeding.  Physical Exam Vitals and nursing note reviewed.  Constitutional:      Appearance: She is well-developed.  HENT:     Head: Normocephalic and atraumatic.  Cardiovascular:     Rate and Rhythm: Normal rate and regular rhythm.  Pulmonary:     Effort: Pulmonary effort is normal.     Breath sounds: Normal breath sounds.  Skin:    General: Skin is warm and dry.     Capillary Refill: Capillary refill takes less than 2 seconds.  Neurological:     General: No focal deficit present.     Mental Status: She is alert and oriented to person, place, and time.    MAU Course  Procedures NST reactive  MDM   Assessment and Plan  Decreased fetal movement [redacted] weeks gestational age Mild preeclampsia Bipolar disorder  Due to decreased fetal movement, will admit patient to the labor floor and start induction.  GBS negative Desires BTL   Truett Mainland 01/27/2022, 7:07 PM

## 2022-01-27 NOTE — Anesthesia Procedure Notes (Signed)
Epidural Patient location during procedure: OB Start time: 01/27/2022 11:15 PM End time: 01/27/2022 11:25 PM  Staffing Anesthesiologist: Merlinda Frederick, MD Performed: anesthesiologist   Preanesthetic Checklist Completed: patient identified, IV checked, site marked, risks and benefits discussed, monitors and equipment checked, pre-op evaluation and timeout performed  Epidural Patient position: sitting Prep: DuraPrep Patient monitoring: heart rate, cardiac monitor, continuous pulse ox and blood pressure Approach: midline Location: L2-L3 Injection technique: LOR saline  Needle:  Needle type: Tuohy  Needle gauge: 17 G Needle length: 9 cm Needle insertion depth: 6 cm Catheter type: closed end flexible Catheter size: 20 Guage Catheter at skin depth: 11 cm Test dose: negative and Other  Assessment Events: blood not aspirated, injection not painful, no injection resistance and negative IV test  Additional Notes Informed consent obtained prior to proceeding including risk of failure, 1% risk of PDPH, risk of minor discomfort and bruising.  Discussed rare but serious complications including epidural abscess, permanent nerve injury, epidural hematoma.  Discussed alternatives to epidural analgesia and patient desires to proceed.  Timeout performed pre-procedure verifying patient name, procedure, and platelet count.  Patient tolerated procedure well.

## 2022-01-28 ENCOUNTER — Encounter (HOSPITAL_COMMUNITY): Payer: Self-pay | Admitting: Family Medicine

## 2022-01-28 ENCOUNTER — Inpatient Hospital Stay (HOSPITAL_COMMUNITY): Admission: AD | Admit: 2022-01-28 | Payer: 59 | Source: Home / Self Care | Admitting: Family Medicine

## 2022-01-28 ENCOUNTER — Inpatient Hospital Stay (HOSPITAL_COMMUNITY): Payer: 59

## 2022-01-28 DIAGNOSIS — O09299 Supervision of pregnancy with other poor reproductive or obstetric history, unspecified trimester: Secondary | ICD-10-CM

## 2022-01-28 DIAGNOSIS — Z8759 Personal history of other complications of pregnancy, childbirth and the puerperium: Secondary | ICD-10-CM

## 2022-01-28 DIAGNOSIS — O403XX0 Polyhydramnios, third trimester, not applicable or unspecified: Secondary | ICD-10-CM

## 2022-01-28 DIAGNOSIS — Z3A36 36 weeks gestation of pregnancy: Secondary | ICD-10-CM

## 2022-01-28 DIAGNOSIS — O09899 Supervision of other high risk pregnancies, unspecified trimester: Secondary | ICD-10-CM

## 2022-01-28 DIAGNOSIS — Z641 Problems related to multiparity: Secondary | ICD-10-CM

## 2022-01-28 DIAGNOSIS — O368130 Decreased fetal movements, third trimester, not applicable or unspecified: Secondary | ICD-10-CM

## 2022-01-28 DIAGNOSIS — O0990 Supervision of high risk pregnancy, unspecified, unspecified trimester: Secondary | ICD-10-CM

## 2022-01-28 DIAGNOSIS — O134 Gestational [pregnancy-induced] hypertension without significant proteinuria, complicating childbirth: Secondary | ICD-10-CM

## 2022-01-28 LAB — RPR: RPR Ser Ql: NONREACTIVE

## 2022-01-28 MED ORDER — SIMETHICONE 80 MG PO CHEW: 80.0000 mg | CHEWABLE_TABLET | ORAL | Status: DC | PRN

## 2022-01-28 MED ORDER — SENNOSIDES-DOCUSATE SODIUM 8.6-50 MG PO TABS
2.0000 | ORAL_TABLET | Freq: Every day | ORAL | Status: DC
  Administered 2022-01-29: 2 via ORAL
  Filled 2022-01-28: qty 2

## 2022-01-28 MED ORDER — IBUPROFEN 600 MG PO TABS
600.0000 mg | ORAL_TABLET | Freq: Four times a day (QID) | ORAL | Status: DC
  Administered 2022-01-28 (×2): 600 mg via ORAL
  Filled 2022-01-28 (×4): qty 1

## 2022-01-28 MED ORDER — FAMOTIDINE 20 MG PO TABS: 40.0000 mg | ORAL_TABLET | Freq: Once | ORAL | Status: DC

## 2022-01-28 MED ORDER — DIBUCAINE (PERIANAL) 1 % EX OINT: 1.0000 "application " | TOPICAL_OINTMENT | CUTANEOUS | Status: DC | PRN

## 2022-01-28 MED ORDER — ONDANSETRON HCL 4 MG/2ML IJ SOLN: 4.0000 mg | INTRAMUSCULAR | Status: DC | PRN

## 2022-01-28 MED ORDER — WITCH HAZEL-GLYCERIN EX PADS: 1.0000 "application " | MEDICATED_PAD | CUTANEOUS | Status: DC | PRN

## 2022-01-28 MED ORDER — SODIUM CHLORIDE 0.9 % IV SOLN
12.5000 mg | Freq: Once | INTRAVENOUS | Status: AC
  Administered 2022-01-28: 12.5 mg via INTRAVENOUS
  Filled 2022-01-28: qty 0.5

## 2022-01-28 MED ORDER — BENZOCAINE-MENTHOL 20-0.5 % EX AERO
1.0000 "application " | INHALATION_SPRAY | CUTANEOUS | Status: DC | PRN
  Administered 2022-01-28: 1 via TOPICAL
  Filled 2022-01-28: qty 56

## 2022-01-28 MED ORDER — DIPHENHYDRAMINE HCL 25 MG PO CAPS: 25.0000 mg | ORAL_CAPSULE | Freq: Four times a day (QID) | ORAL | Status: DC | PRN

## 2022-01-28 MED ORDER — FAMOTIDINE IN NACL 20-0.9 MG/50ML-% IV SOLN
20.0000 mg | Freq: Once | INTRAVENOUS | Status: AC
  Administered 2022-01-28: 20 mg via INTRAVENOUS
  Filled 2022-01-28: qty 50

## 2022-01-28 MED ORDER — LACTATED RINGERS IV SOLN: INTRAVENOUS | Status: DC

## 2022-01-28 MED ORDER — COCONUT OIL OIL: 1.0000 "application " | TOPICAL_OIL | Status: DC | PRN

## 2022-01-28 MED ORDER — PRENATAL MULTIVITAMIN CH: 1.0000 | ORAL_TABLET | Freq: Every day | ORAL | Status: DC

## 2022-01-28 MED ORDER — TRANEXAMIC ACID-NACL 1000-0.7 MG/100ML-% IV SOLN
INTRAVENOUS | Status: AC
  Filled 2022-01-28: qty 100

## 2022-01-28 MED ORDER — ONDANSETRON HCL 4 MG PO TABS: 4.0000 mg | ORAL_TABLET | ORAL | Status: DC | PRN

## 2022-01-28 MED ORDER — METOCLOPRAMIDE HCL 10 MG PO TABS: 10.0000 mg | ORAL_TABLET | Freq: Once | ORAL | Status: DC

## 2022-01-28 MED ORDER — TETANUS-DIPHTH-ACELL PERTUSSIS 5-2.5-18.5 LF-MCG/0.5 IM SUSY: 0.5000 mL | PREFILLED_SYRINGE | Freq: Once | INTRAMUSCULAR | Status: DC

## 2022-01-28 MED ORDER — MEDROXYPROGESTERONE ACETATE 150 MG/ML IM SUSP
150.0000 mg | INTRAMUSCULAR | Status: AC | PRN
  Administered 2022-01-29: 150 mg via INTRAMUSCULAR
  Filled 2022-01-28: qty 1

## 2022-01-28 MED ORDER — TRANEXAMIC ACID-NACL 1000-0.7 MG/100ML-% IV SOLN: 1000.0000 mg | Freq: Once | INTRAVENOUS | Status: DC

## 2022-01-28 MED ORDER — ACETAMINOPHEN 325 MG PO TABS: 650.0000 mg | ORAL_TABLET | ORAL | Status: DC | PRN

## 2022-01-28 NOTE — Discharge Summary (Signed)
° °  Postpartum Discharge Summary ° °Date of Service updated 01/29/22 ° °   °Patient Name: [name] °DOB: 11/02/1986 °MRN: [id number] ° °Date of admission: 01/27/2022 °Delivery date:01/28/2022  °Delivering provider: ALBERT, CHRISTINA M  °Date of discharge: 01/29/2022 ° °Admitting diagnosis: Gestational hypertension [O13.9] °Intrauterine pregnancy: [redacted]w[redacted]d     °Secondary diagnosis:  Principal Problem: °  Vaginal delivery °Active Problems: °  GERD (gastroesophageal reflux disease) °  Bipolar affective disorder, depressed, moderate degree (HCC) °  Obesity (BMI 30-39.9) °  Intellectual disability °  Gestational hypertension °  Supervision of high risk pregnancy, antepartum °  History of preterm delivery, currently pregnant °  History of pre-eclampsia in prior pregnancy, currently pregnant °  History of gestational hypertension °  Grand multipara °  Abnormal antenatal ultrasound °  Unwanted fertility °  Polyhydramnios affecting pregnancy in third trimester ° °Additional problems: None    °Discharge diagnosis: Term Pregnancy Delivered                                              °Post partum procedures: Depo injection °Augmentation: AROM and Pitocin °Complications: None ° °Hospital course: Induction of Labor With Vaginal Delivery   °35 y.o. yo G8P4125 at [redacted]w[redacted]d was admitted to the hospital 01/27/2022 for induction of labor.  Indication for induction: Gestational hypertension.  Patient had an uncomplicated labor course and progressed to complete after Pitocin and AROM. She had an uncomplicated vaginal delivery.  °Membrane Rupture Time/Date: 8:34 AM ,01/28/2022   °Delivery Method:Vaginal, Spontaneous  °Episiotomy: None  °Lacerations:  1st degree  °Details of delivery can be found in separate delivery note.  Patient had a routine postpartum course. Patient is discharged home 01/29/22. ° °Newborn Data: °Birth date:01/28/2022  °Birth time:11:56 AM  °Gender:Female  °Living status:Living  °Apgars:9 ,9  °Weight:3600 g   ° °Magnesium Sulfate received: No °BMZ received: No °Rhophylac: N/A °MMR: N/A °T-DaP: Offered postpartum  °Flu: No °Transfusion: No ° °Physical exam  °Vitals:  ° 01/28/22 1420 01/28/22 1535 01/28/22 1930 01/29/22 1459  °BP: 121/80 131/75 115/78 130/78  °Pulse: (!) 52 79 73 65  °Resp: 18 20  20  °Temp: 98.9 °F (37.2 °C) 98.6 °F (37 °C)  98.7 °F (37.1 °C)  °TempSrc: Oral Oral  Oral  °SpO2: 100%   99%  °Weight:      °Height:      ° °General: alert, cooperative, and no distress °Lochia: appropriate °Uterine Fundus: firm °Incision: N/A °DVT Evaluation: No evidence of DVT seen on physical exam. ° °Labs: °Lab Results  °Component Value Date  ° WBC 7.4 01/27/2022  ° HGB 11.2 (L) 01/27/2022  ° HCT 35.1 (L) 01/27/2022  ° MCV 88.4 01/27/2022  ° PLT 157 01/27/2022  ° °CMP Latest Ref Rng & Units 01/27/2022  °Glucose 70 - 99 mg/dL 111(H)  °BUN 6 - 20 mg/dL 7  °Creatinine 0.44 - 1.00 mg/dL 0.79  °Sodium 135 - 145 mmol/L 134(L)  °Potassium 3.5 - 5.1 mmol/L 3.7  °Chloride 98 - 111 mmol/L 103  °CO2 22 - 32 mmol/L 20(L)  °Calcium 8.9 - 10.3 mg/dL 8.4(L)  °Total Protein 6.5 - 8.1 g/dL 5.6(L)  °Total Bilirubin 0.3 - 1.2 mg/dL 0.9  °Alkaline Phos 38 - 126 U/L 135(H)  °AST 15 - 41 U/L 20  °ALT 0 - 44 U/L 8  ° °Edinburgh Score: °Edinburgh Postnatal Depression Scale Screening   Screening Tool 01/28/2022  I have been able to laugh and see the funny side of things. 0  I have looked forward with enjoyment to things. 0  I have blamed myself unnecessarily when things went wrong. 0  I have been anxious or worried for no good reason. 0  I have felt scared or panicky for no good reason. 0  Things have been getting on top of me. 0  I have been so unhappy that I have had difficulty sleeping. 0  I have felt sad or miserable. 0  I have been so unhappy that I have been crying. 0  The thought of harming myself has occurred to me. 0  Edinburgh Postnatal Depression Scale Total 0     After visit meds:  Allergies as of 01/29/2022       Reactions    Depakote [divalproex Sodium] Other (See Comments)   Pt states that this medication makes her blood levels toxic.     Methylphenidate Derivatives Other (See Comments)   Reaction:  Depression and anger    Neurontin [gabapentin] Other (See Comments)   Reaction:  Dizziness    Prozac [fluoxetine Hcl] Other (See Comments)   Reaction:  Anger    Methylphenidate Hcl    SAME AS PREVIOUS LISTED METHYLPHENIDATE        Medication List     TAKE these medications    ABILIFY IM Inject 300 mg into the muscle every 30 (thirty) days.   Acetaminophen Extra Strength 500 MG tablet Generic drug: acetaminophen Take 2 tablets (1,000 mg total) by mouth every 8 (eight) hours as needed (pain).   albuterol 108 (90 Base) MCG/ACT inhaler Commonly known as: VENTOLIN HFA Inhale 2 puffs into the lungs every 6 (six) hours as needed for wheezing or shortness of breath.   Blood Pressure Kit Devi 1 Device by Does not apply route once a week.   doxylamine (Sleep) 25 MG tablet Commonly known as: UNISOM Take 25 mg by mouth at bedtime as needed.   famotidine 20 MG tablet Commonly known as: Pepcid Take 1 tablet (20 mg total) by mouth 2 (two) times daily as needed for heartburn or indigestion.   furosemide 20 MG tablet Commonly known as: Lasix Take 1 tablet (20 mg total) by mouth daily for 5 days.   ibuprofen 600 MG tablet Commonly known as: ADVIL Take 1 tablet (600 mg total) by mouth every 6 (six) hours as needed (pain).   lamoTRIgine 25 MG tablet Commonly known as: LAMICTAL Take 25 mg by mouth daily.   ondansetron 4 MG disintegrating tablet Commonly known as: ZOFRAN-ODT Take 1 tablet (4 mg total) by mouth every 8 (eight) hours as needed for up to 27 days for nausea or vomiting.   oxyCODONE 5 MG immediate release tablet Commonly known as: Roxicodone Take 1-2 tablets (5-10 mg total) by mouth every 6 (six) hours as needed for severe pain.   prenatal multivitamin Tabs tablet Take 1 tablet by mouth  daily at 12 noon.         Discharge home in stable condition Infant Feeding: Bottle Infant Disposition:home with mother Discharge instruction: per After Visit Summary and Postpartum booklet. Activity: Advance as tolerated. Pelvic rest for 6 weeks.  Diet: routine diet Future Appointments: Future Appointments  Date Time Provider [place]  02/04/2022  8:30 AM [place] NURSE [facility]  03/11/2022 10:15 AM Clarnce Flock, MD [facility]   Follow up Visit: Message sent to [facility] by Dr. Gwenlyn Perking on 01/28/22.  Please schedule this patient for a In person postpartum visit in 6 weeks with the following provider: MD. Additional Postpartum F/U: BP check 1 week  High risk pregnancy complicated by: HTN and polyhydramnios Delivery mode:  Vaginal, Spontaneous  Anticipated Birth Control:   BTL --interval  01/29/2022 Fatima Blank, CNM

## 2022-01-28 NOTE — Progress Notes (Signed)
Labor Progress Note [name] is a 36 y.o. [id number] at [redacted]w[redacted]d presented for IOL due to gestational hypertension/DFM.   S: Feeling nauseous but doing okay.   O:  BP 113/68    Pulse 62    Temp 98.6 F (37 C)    Resp 16    Ht 5\' 3"  (1.6 m)    Wt 115.3 kg    LMP 05/14/2021    SpO2 98%    BMI 45.05 kg/m  EFM: 120/mod/15x15/none  CVE: Dilation: 4.5 Effacement (%): 60 Cervical Position: Middle Station: -3 Presentation: Vertex Exam by:: Dr. Higinio Plan   A&P: 36 y.o. [id number] [redacted]w[redacted]d  #Labor: Largely unchanged compared to previous check by RN. Head not well applied to cervix. Will increase pit as tolerated. Hopeful with improvement in persistent contractions that head will be better applied and proceed with AROM on next check in the next 2 or so hours.  #Pain: Epidural in place   #FWB: Cat I  #GBS negative  #gHTN: BP WNL since epidural placement. Pre-e labs WNL. No symptoms. Cont to monitor.   #Polyhydramnios: AFI 28 on most recent US. Will be mindful with AROM as above.   Patriciaann Clan, DO 5:43 AM

## 2022-01-28 NOTE — Clinical Social Work Maternal (Signed)
CLINICAL SOCIAL WORK MATERNAL/CHILD NOTE  Patient Details  Name: [name] MRN: [id number] Date of Birth: 01/28/2021  Date:  01/28/2022  Clinical Social Worker Initiating Note:  Darra Lis, [place] Date/Time: Initiated:  01/28/22/0330     Child's Name:  [name]   Biological Parents:  Mother, Father [name] 06/12/1979)   Need for Interpreter:  None   Reason for Referral:  Behavioral Health Concerns, Other (Comment) (Previous CPS history)   Address:  [place] [address]    Phone number:  [phone] (home)     Additional phone number:   Household Members/Support Persons (HM/SP):   Household Member/Support Person 1, Household Member/Support Person 2, Household Member/Support Person 3   HM/SP Name Relationship DOB or Age  HM/SP -[place] Significant Other 06/12/1979  HM/SP -[place]. Son 02/03/2017  HM/SP -[place] Daughter 03/16/2020  HM/SP -4  Macy Mis (lives with cousin Verna Czech) Daughter 02/11/2016  HM/SP -[place] (live with Abran Richard and Thelma Barge) Son 02/19/2018  HM/SP -[place] (live with Abran Richard and Benjie Karvonen)  Son 13/2020  HM/SP -7        HM/SP -8          Natural Supports (not living in the home):  Friends, Immediate Family   Professional Supports: Case Metallurgist, Organized support group (Comment)   Employment: Disabled   Type of Work:     Education:  High school graduate   Homebound arranged:    Museum/gallery curator Resources:  Medicaid   Other Resources:  Physicist, medical  , [place] Considerations Which May Impact Care:    Strengths:  Ability to meet basic needs  , Engineer, materials, Home prepared for child  , Psychotropic Medications   Psychotropic Medications:  Lamictal, Abilify      Pediatrician:    Solicitor area  Pediatrician List:   [facility]  for [place]      Pediatrician Fax Number:    Risk Factors/Current Problems:  DHHS Involvement     Cognitive State:  Alert  , Insightful  , Linear Thinking     Mood/Affect:  Anxious  , Interested  , Happy     CSW Assessment: CSW consulted for "Bipolar, dep, anx  History of CPS involvement with prior children." [name] also requested food vouchers for [name]. CSW met with [name] to assess and provide support. CSW introduced self and role. CSW observed infant sleeping in bassinet and [name] bedside. [name] provided permission for [name] to remain in room for assessment. CSW provided [name] with 3 food vouchers. CSW informed [name] of reason for consult and assessed current feelings. [name] reported she is currently doing well, however the pregnancy was rough due to her blood pressure. [name] stated she lives with [name], daughter Hassan Rowan and that she just obtained custody of son Roselyn Reef from [place] in July 2022. [name] acknowledged that her other children live with family due to previous CPS history. [name] reported each time she had a child, CPS was contacted by hospital staff due to previous involvement. [name] did not provide specific reason of why children were removed from her care. [name] denies any additional CPS allegations and has not had a CPS case since the birth of Hassan Rowan. [name] aware  CSW will have to make a new CPS report due to some children not being in her custody. [name] expressed concern with CPS involvement and asked if infant will be going home. CSW informed [name] that [place] will let CSW know if there are any barriers to discharge. [name] reported Hassan Rowan and Roselyn Reef are currently being cared for by a family friend.   CSW inquired on [name] mental health history. [name] acknowledged being diagnosed with bipolar, anxiety and depression as a child. [name] denies having an intellectual disability. [name] is currently prescribed Lamictal 36m and Ability  332m which she states is helpful. [name] reported her psychiatrist is 'Dr. IzAltamese [place] [name] shared she is also in counseling 2x a week with [place], [name] is in the Ba[employer] Groupnd has a CCComptrollerRoPhysiological scientist [name] denies any mental health concerns during the pregnancy. [name] identified walking, music and reading as coping skills. [name] was actively engaged throughout the assessment and disclosed he is also diagnosed with anxiety, depression and bipolar. [name] is prescribed Prozac and Vistaril. [name] has an ACT team with [facility] and is enrolled with Fathers are Parents Too program. [name] denies any current SI, HI or DV. [name] identified friends and siblings as additional supports.   CSW provided education regarding the baby blues period versus PPD and provided resources. CSW provided the New Mom Checklist and encouraged [name] to self evaluate. CSW provided review of Sudden Infant Death Syndrome (SIDS) precautions.  [name] stated she has everything needed for infant including a packn'play and car seat. [name] receptive to referral for [facility]Asc[facility] InstMOB was appropriate during assessment and did not display any acute mental health symptoms.   CSW made CPS report with P.Malachy Chamberf GuKenaiBarriers to discharge at this time.  CSW Plan/Description:  CSW Will Continue to Monitor Umbilical Cord Tissue Drug Screen Results and Make Report if Warranted, Child Protective Service Report  , Hospital Drug Screen Policy Information, Perinatal Mood and Anxiety Disorder (PMADs) Education, Sudden Infant Death Syndrome (SIDS) Education, Other Information/Referral to CoIntel CorporationCSW Awaiting CPS Disposition Plan, Other Patient/Family Education    ChWaylan BogaLC[place]rykers[place]/14/2023, 4:17 PM

## 2022-01-28 NOTE — Progress Notes (Signed)
Labor Progress Note TIYONNA SARDINHA is a 36 y.o. [id number] at [redacted]w[redacted]d who presented for IOL due to gHTN and DFM.   S: Doing well. Comfortable with epidural. Interested in breaking her water.   O:  BP 110/65    Pulse (!) 58    Temp 98.6 F (37 C)    Resp 17    Ht 5\' 3"  (1.6 m)    Wt 115.3 kg    LMP 05/14/2021    SpO2 98%    BMI 45.05 kg/m   EFM: Baseline 125 bpm, moderate variability, + accels, no decels   CVE: Dilation: 4.5 Effacement (%): 60 Cervical Position: Middle Station: -3 Presentation: Vertex Exam by:: Dr. Higinio Plan   A&P: 36 y.o. [id number] [redacted]w[redacted]d   #Labor: SVE unchanged since last check. Fetal head well applied. Verbally consented for AROM. Performed with bloody/clear fluid. Will continue Pitocin and reassess in 3-4 hours, sooner if patient feels pressure or urge to push.  #Pain: Epidural  #FWB: Cat 1  #GBS negative  #gHTN: BP within normal range. No symptoms. Will continue to monitor.   Genia Del, MD 9:38 AM

## 2022-01-28 NOTE — Lactation Note (Signed)
This note was copied from a baby's chart. Lactation Consultation Note  Patient Name: Girl Alegandra Sommers [id number] Date: 01/28/2022  LC went in to assist with breastfeeding. Mother stated feeding choice will be formula only going forward. LC completed mother off of [place] services.  Age:35 hours  Maternal Data    Feeding    LATCH Score                    Lactation Tools Discussed/Used    Interventions    Discharge    Consult Status      Murphy Bundick  Nicholson-Springer 01/28/2022, 5:39 PM

## 2022-01-29 ENCOUNTER — Encounter (HOSPITAL_COMMUNITY)
Admission: AD | Disposition: A | Discharge status: Home / Self Care | Payer: Self-pay | Source: Home / Self Care | Attending: Family Medicine

## 2022-01-29 ENCOUNTER — Other Ambulatory Visit (HOSPITAL_COMMUNITY): Payer: Self-pay

## 2022-01-29 SURGERY — LIGATION, FALLOPIAN TUBE, POSTPARTUM
Anesthesia: Choice | Laterality: Bilateral

## 2022-01-29 MED ORDER — OXYCODONE HCL 5 MG PO TABS
5.0000 mg | ORAL_TABLET | Freq: Four times a day (QID) | ORAL | 0 refills | Status: DC | PRN
Start: 2022-01-29 — End: 2022-04-01

## 2022-01-29 MED ORDER — IBUPROFEN 600 MG PO TABS
600.0000 mg | ORAL_TABLET | Freq: Four times a day (QID) | ORAL | 0 refills | Status: DC | PRN
  Filled 2022-01-29: qty 40, 10d supply, fill #0

## 2022-01-29 MED ORDER — ACETAMINOPHEN 500 MG PO TABS
1000.0000 mg | ORAL_TABLET | Freq: Three times a day (TID) | ORAL | 0 refills | Status: AC | PRN
Start: 2022-01-29 — End: ?
  Filled 2022-01-29: qty 60, 10d supply, fill #0

## 2022-01-29 MED ORDER — OXYCODONE HCL 5 MG PO TABS
5.0000 mg | ORAL_TABLET | Freq: Four times a day (QID) | ORAL | Status: DC | PRN
  Administered 2022-01-29 (×2): 5 mg via ORAL
  Filled 2022-01-29 (×3): qty 1

## 2022-01-29 MED ORDER — FUROSEMIDE 20 MG PO TABS
20.0000 mg | ORAL_TABLET | Freq: Every day | ORAL | 0 refills | Status: DC
Start: 2022-01-29 — End: 2022-03-24
  Filled 2022-01-29: qty 5, 5d supply, fill #0

## 2022-01-29 MED ORDER — ETONOGESTREL 68 MG ~~LOC~~ IMPL
68.0000 mg | DRUG_IMPLANT | Freq: Once | SUBCUTANEOUS | Status: DC
  Filled 2022-01-29: qty 1

## 2022-01-29 MED ORDER — LIDOCAINE HCL 1 % IJ SOLN
0.0000 mL | Freq: Once | INTRAMUSCULAR | Status: DC | PRN
  Filled 2022-01-29: qty 20

## 2022-01-29 NOTE — Progress Notes (Signed)
Progress Note:   Late documentation, saw patient around 77.   Patient reports she no longer wants her BTL. She feels like she never wanted it in the first place, felt pressured by her sister to have this done. She is not sure if she may want more children in the future and does not want to proceed with a permanent procedure.   Discussed alternative birth control options including IUD, nexplanon, etc. After discussion, she would like to proceed with nexplanon placement prior to hospital discharge. She has had this before and feels she tolerated it well.   Epidural may be pulled.   Patriciaann Clan, DO

## 2022-01-29 NOTE — Progress Notes (Signed)
Post Partum Day 1 Subjective: Doing well. No acute events overnight. Pain is controlled and bleeding is appropriate. She is eating, drinking, voiding, and ambulating without issue. She is formula feeding which is going well. She has no other concerns at this time.  Objective: Blood pressure 115/78, pulse 73, temperature 98.6 F (37 C), temperature source Oral, resp. rate 20, height 5\' 3"  (1.6 m), weight 115.3 kg, last menstrual period 05/14/2021, SpO2 100 %, unknown if currently breastfeeding.  Physical Exam:  General: alert, cooperative, and no distress Lochia: appropriate Uterine Fundus: firm and below umbilicus  DVT Evaluation: no LE edema or calf tenderness to palpation   Recent Labs    01/27/22 1920  HGB 11.2*  HCT 35.1*    Assessment/Plan: [name] is a 36 y.o. [id number] on PPD# 1 s/p SVD.  Progressing well. Meeting postpartum milestones. VSS. Continue routine postpartum care.  #gHTN: - BP stable postpartum and within normal limits - No signs/symptoms of pre-eclampsia  - Will continue to monitor   Feeding: Formula   Contraception: Had long discussion with patient this morning regarding her birth control plan. Initially planned for BTL, then decided overnight that she would like to have a Nexplanon placed. Upon discussion with patient this morning, patient stated she did not want the Nexplanon because she was afraid that it would hurt. Discussed that the lidocaine injection is the only brief painful part of the procedure and that she would be numb for insertion. Patient voiced understanding but still declined Nexplanon placement. She then inquired about potentially doing her BTL today under general anesthesia. Discussed this could not be done today given that she ate lunch at 12:30. Offered to schedule BTL for tomorrow but patient declined stating that she would need to get home tomorrow to her other children. Patient ultimately opted to proceed with Depo injection in  the interim with plan for BTL outpatient. Will send message to office to help coordinate this for her.   Dispo: Plan for discharge on PPD#2.    LOS: 2 days   Genia Del, MD  01/29/2022, 9:51 AM

## 2022-01-29 NOTE — Progress Notes (Signed)
RN went to do patients first 4 hour check and pt stated RN could do this one assessment and after this assessment she refuses to be assessed again. Pt refused any additional vitals, assessments or lab work for the rest of her stay. Pt changed mind about tubal procedure and requested other forms of birth control. RN notified doctor who came to beside to discuss birth control options.

## 2022-01-29 NOTE — Anesthesia Postprocedure Evaluation (Signed)
Anesthesia Post Note  Patient: [name]  Procedure(s) Performed: AN AD HOC LABOR EPIDURAL     Patient location during evaluation: Mother Baby Anesthesia Type: Epidural Level of consciousness: awake and alert Pain management: pain level controlled Vital Signs Assessment: post-procedure vital signs reviewed and stable Respiratory status: spontaneous breathing, nonlabored ventilation and respiratory function stable Cardiovascular status: stable Postop Assessment: no headache, no backache and epidural receding Anesthetic complications: no   No notable events documented.  Last Vitals:  Vitals:   01/28/22 1535 01/28/22 1930  BP: 131/75 115/78  Pulse: 79 73  Resp: 20   Temp: 37 C   SpO2:      Last Pain:  Vitals:   01/28/22 1930  TempSrc:   PainSc: 0-No pain   Pain Goal: Patients Stated Pain Goal: 0 (01/27/22 2300)                 Jeanine Caven

## 2022-01-29 NOTE — Social Work (Addendum)
CSW met with [name] multiple times to answer questions. CSW has reached out to CPS SW S. Bookman and is awaiting follow-up. ° °Update: Per [place] CPS, no barriers to discharge.  ° ° , LCSWA °Clinical Social Work °[facility] °[phone] °

## 2022-01-30 NOTE — Progress Notes (Signed)
Patient is 36 3/[redacted] weeks gestation for preterm labor eval.

## 2022-02-04 ENCOUNTER — Telehealth (INDEPENDENT_AMBULATORY_CARE_PROVIDER_SITE_OTHER): Payer: 59

## 2022-02-04 VITALS — BP 114/76

## 2022-02-04 DIAGNOSIS — Z0130 Encounter for examination of blood pressure without abnormal findings: Secondary | ICD-10-CM

## 2022-02-04 NOTE — Progress Notes (Signed)
Blood Pressure Check Visit  [name] had a telephone visit today for a blood pressure check following induced vaginal delivery on 01/28/22. Telephone visit lasted approximately 10 minutes. Patient checked her BP at home and stated it is 114/76. Patient denies any dizziness, blurred vision, headache, shortness of breath, peripheral edema. I reviewed signs and symptoms of pre-eclampsia with patient. I encouraged patient to continue checking her blood pressure at home and logging it in her babyscripts app. Patient denies any other concerns or questions. I reviewed patient's postpartum visit date and time with her.   Seth Bake, RN 02/04/2022

## 2022-02-06 ENCOUNTER — Telehealth (HOSPITAL_COMMUNITY): Payer: Self-pay

## 2022-02-06 NOTE — Telephone Encounter (Addendum)
"  Doing ok. How long does this bleeding last?" RN reviewed normal lochia amounts, color, and duration of bleeding. Patient declines any other questions or concerns about there healing.  "She's great. Eating and gaining weight. She sleeps in a crib." RN reviewed ABC's of safe sleep with patient. Patient declines any questions or concerns about baby.  Explained EPDS to patient. Patient states that she does not have time. RN attempted to ask if we could call her at another time and phone line disconnected.   Sharyn Lull [facility] 02/06/2022,1459

## 2022-02-25 ENCOUNTER — Ambulatory Visit: Payer: 59 | Admitting: Family Medicine

## 2022-02-25 ENCOUNTER — Telehealth (INDEPENDENT_AMBULATORY_CARE_PROVIDER_SITE_OTHER): Payer: 59 | Admitting: Obstetrics and Gynecology

## 2022-02-25 DIAGNOSIS — Z3009 Encounter for other general counseling and advice on contraception: Secondary | ICD-10-CM

## 2022-02-25 NOTE — Progress Notes (Signed)
? ?TELEHEALTH GYNECOLOGY VISIT ENCOUNTER NOTE ? ?Provider location: [facility]  ? ?Patient location: Home ? ?I connected with [name] on 02/25/22 at  1:35 PM EDT by telephone and verified that I am speaking with the correct person using two identifiers. Patient was unable to do MyChart audiovisual encounter due to technical difficulties, she tried several times.  ?  ?I discussed the limitations, risks, security and privacy concerns of performing an evaluation and management service by telephone and the availability of in person appointments. I also discussed with the patient that there may be a patient responsible charge related to this service. The patient expressed understanding and agreed to proceed. ?  ?History:  ?[name] is a 36 y.o. [id number] female being evaluated today for laparoscopic BTL. ? ?Patient had in person visit schedule for today but ha to reschedule to virtual. ? ?She is s/p 2/14 SVD and was for ppBTL but changed her mind due to perceived pressure from her family.  Today, she is sure to want a BTL. Paper signed 11/2021, last weight 254lbs and no prior abdominal surgeries. She did receive depo provera prior to discharge.  ?  ?  ?Past Medical History:  ?Diagnosis Date  ? Anxiety   ? Asthma   ? inhaler PRN-hasn't used inhaler in months  ? Attention deficit hyperactivity disorder   ? Bipolar disorder ([place])   ? hospitalized as a teen for suicidal ideations and cutting  ? Depression   ? no complaints, doing well  ? Gastritis   ? GERD (gastroesophageal reflux disease)   ? History of gestational diabetes in prior pregnancy, currently pregnant 10/13/2017  ? History of preterm delivery after IOL for preeclampsia, currently pregnant 10/13/2017  ? Not a 17P candidate  ? Hx of varicella   ? Mild preeclampsia 02/09/2016  ? Nausea and vomiting during pregnancy 10/04/2019  ? Vaginal Pap smear, abnormal   ? ?Past Surgical History:  ?Procedure  Laterality Date  ? DILATION AND EVACUATION N/A 01/08/2021  ? Procedure: DILATATION AND EVACUATION;  Surgeon: Chancy Milroy, MD;  Location: [facility];  Service: Gynecology;  Laterality: N/A;  ? EYE MUSCLE SURGERY Bilateral   ? 07/29/12  ? MOUTH SURGERY    ? PLANTAR FASCIA SURGERY    ? WISDOM TOOTH EXTRACTION    ? ?The following portions of the patient's history were reviewed and updated as appropriate: allergies, current medications, past family history, past medical history, past social history, past surgical history and problem list.  ? ? ?Review of Systems:  ?Pertinent items noted in HPI and remainder of comprehensive ROS otherwise negative. ? ?Physical Exam:  ? ?General:  Alert, oriented and cooperative.   ?Mental Status: Normal mood and affect perceived. Normal judgment and thought content.  ?Physical exam deferred due to nature of the encounter ? ?Labs and Imaging ?No results found. However, due to the size of the patient record, not all encounters were searched. Please check Results Review for a complete set of results. ?No results found.    ?Assessment and Plan:  ?   ?R/b/a d/w her namely risks with surgery and risks of failure and to consider procedure permanent. Patient amenable to procedure and would like to do laparoscopic bilateral salpingectomy. Request sent to scheduler ? ?Given patient difficulty with appointments, okay to schedule w/o in person pre op visit.  ?   ?  ?I discussed the assessment and treatment plan with the patient. The patient  was provided an opportunity to ask questions and all were answered. The patient agreed with the plan and demonstrated an understanding of the instructions. ?  ?The patient was advised to call back or seek an in-person evaluation/go to the ED if the symptoms worsen or if the condition fails to improve as anticipated. ? ?I provided 15 minutes of non-face-to-face time during this encounter. ? ? ?Aletha Halim, MD ?[facility] ? ?

## 2022-02-25 NOTE — Progress Notes (Signed)
I connected with  [name] on 02/25/22 at  1:35 PM EDT by MyChart Video Visit and verified that I am speaking with the correct person using two identifiers. ?  ?I discussed the limitations, risks, security and privacy concerns of performing an evaluation and management service by telephone and the availability of in person appointments. I also discussed with the patient that there may be a patient responsible charge related to this service. The patient expressed understanding and agreed to proceed. ? ?Mena Goes, CMA ?02/25/2022  1:37 PM  ?

## 2022-03-11 ENCOUNTER — Ambulatory Visit: Payer: Self-pay | Admitting: Family Medicine

## 2022-03-16 ENCOUNTER — Encounter: Payer: 59 | Admitting: Family

## 2022-03-16 NOTE — Progress Notes (Signed)
Discussed with patient that the visit would have to be under patients name. Since she is 3, I recommend doing [place] visit.  ? ?Nichole Dun, FNP ? ?

## 2022-03-17 ENCOUNTER — Encounter: Payer: Self-pay | Admitting: Obstetrics and Gynecology

## 2022-03-17 ENCOUNTER — Other Ambulatory Visit: Payer: Self-pay | Admitting: Obstetrics and Gynecology

## 2022-03-17 DIAGNOSIS — Z01818 Encounter for other preprocedural examination: Secondary | ICD-10-CM

## 2022-03-24 ENCOUNTER — Telehealth: Payer: Self-pay | Admitting: Family Medicine

## 2022-03-24 ENCOUNTER — Encounter (HOSPITAL_BASED_OUTPATIENT_CLINIC_OR_DEPARTMENT_OTHER): Payer: Self-pay | Admitting: Obstetrics and Gynecology

## 2022-03-24 ENCOUNTER — Other Ambulatory Visit: Payer: Self-pay

## 2022-03-24 NOTE — Telephone Encounter (Signed)
Patient requesting to have her Tubal scheduled.

## 2022-03-26 NOTE — Telephone Encounter (Signed)
Called pt and informed her that her tubal surgery is scheduled on 4/18 @ 1100.  Pt then stated that she was wanting to have it sooner. I advised that it is not possible for a sooner surgery appt.  Pt voiced understanding and confirmed that she "will be there" for the surgery.  ?

## 2022-03-29 ENCOUNTER — Telehealth: Payer: 59 | Admitting: Physician Assistant

## 2022-03-29 ENCOUNTER — Telehealth: Payer: 59 | Admitting: Family Medicine

## 2022-03-29 DIAGNOSIS — B3789 Other sites of candidiasis: Secondary | ICD-10-CM

## 2022-03-29 MED ORDER — CLOTRIMAZOLE-BETAMETHASONE 1-0.05 % EX CREA: 1.0000 "application " | TOPICAL_CREAM | Freq: Two times a day (BID) | CUTANEOUS | 0 refills | Status: AC

## 2022-03-29 MED ORDER — CLOTRIMAZOLE-BETAMETHASONE 1-0.05 % EX CREA: 1.0000 "application " | TOPICAL_CREAM | Freq: Two times a day (BID) | CUTANEOUS | 0 refills | Status: DC

## 2022-03-29 NOTE — Progress Notes (Signed)
Patient created appointment in error ?

## 2022-03-29 NOTE — Progress Notes (Signed)
?Virtual Visit Consent  ? ?[name], you are scheduled for a virtual visit with a [facility] provider today.   ?  ?Just as with appointments in the office, your consent must be obtained to participate.  Your consent will be active for this visit and any virtual visit you may have with one of our providers in the next 365 days.   ?  ?If you have a MyChart account, a copy of this consent can be sent to you electronically.  All virtual visits are billed to your insurance company just like a traditional visit in the office.   ? ?As this is a virtual visit, video technology does not allow for your provider to perform a traditional examination.  This may limit your provider's ability to fully assess your condition.  If your provider identifies any concerns that need to be evaluated in person or the need to arrange testing (such as labs, EKG, etc.), we will make arrangements to do so.   ?  ?Although advances in technology are sophisticated, we cannot ensure that it will always work on either your end or our end.  If the connection with a video visit is poor, the visit may have to be switched to a telephone visit.  With either a video or telephone visit, we are not always able to ensure that we have a secure connection.    ? ?I need to obtain your verbal consent now.   Are you willing to proceed with your visit today?  ?  ?[name] has provided verbal consent on 03/29/2022 for a virtual visit (video or telephone). ?  ?Dellia Nims, FNP  ? ?Date: 03/29/2022 3:49 PM ? ? ?Virtual Visit via Video Note  ? ?[name], connected with  [name]  ([id number], [birth year]) on 03/29/22 at  3:45 PM EDT by a video-enabled telemedicine application and verified that I am speaking with the correct person using two identifiers. ? ?Location: ?Patient: Virtual Visit Location Patient: Home ?Provider: Virtual Visit Location Provider: Home Office ?  ?I discussed the limitations of evaluation and management by  telemedicine and the availability of in person appointments. The patient expressed understanding and agreed to proceed.   ? ?History of Present Illness: ?[name] is a 35 y.o. who identifies as a female who was assigned female at birth, and is being seen today for a rash under breast that is visualized and appears red, raised and fungal. ? ?HPI: HPI  ?Problems:  ?Patient Active Problem List  ? Diagnosis Date Noted  ? Unwanted fertility 12/19/2021  ? Gestational hypertension 03/16/2020  ? 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 Anxiety state 03/27/2014  ? OSA (obstructive sleep apnea) 09/15/2013  ? Asthma 05/21/2013  ? 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 Intellectual disability 09/15/2012  ? Obesity (BMI 30-39.9) 08/03/2012  ? Bipolar affective disorder, depressed, moderate degree ([place]) 06/02/2012  ?  Class: Acute  ? GERD (gastroesophageal reflux disease) 05/19/2012  ? Borderline behavior 04/12/2012  ?  Class: Acute  ?  ?Allergies:  ?Allergies  ?Allergen Reactions  ? Depakote [Divalproex Sodium] Other (See Comments)  ?  Pt states that this medication makes her blood levels toxic.    ? Methylphenidate Derivatives Other (See Comments)  ?  Reaction:  Depression and anger   ? Neurontin [Gabapentin] Other (See Comments)  ?  Reaction:  Dizziness   ? Prozac [Fluoxetine Hcl] Other (See Comments)  ?  Reaction:  Anger   ? Methylphenidate Hcl   ?  SAME AS PREVIOUS LISTED METHYLPHENIDATE  ? ?Medications:  ?Current Outpatient Medications:  ?  acetaminophen (TYLENOL) 500 MG tablet, Take 2 tablets (1,000 mg total) by mouth every 8 (eight) hours as needed (pain). (Patient not taking: Reported on 02/04/2022), Disp: 60 tablet, Rfl: 0 ?  albuterol (VENTOLIN HFA) 108 (90 Base) MCG/ACT inhaler, Inhale 2 puffs into the lungs every 6 (six) hours as needed for wheezing or shortness of breath. (Patient not taking: Reported on 01/10/2022), Disp: 8 g, Rfl: 2 ?   ARIPiprazole (ABILIFY IM), Inject 300 mg into the muscle every 30 (thirty) days., Disp: , Rfl:  ?  Blood Pressure Monitoring (BLOOD PRESSURE KIT) DEVI, 1 Device by Does not apply route once a week. (Patient not taking: Reported on 02/25/2022), Disp: 1 each, Rfl: 0 ?  doxylamine, Sleep, (UNISOM) 25 MG tablet, Take 25 mg by mouth at bedtime as needed. (Patient not taking: Reported on 02/25/2022), Disp: , Rfl:  ?  famotidine (PEPCID) 20 MG tablet, Take 1 tablet (20 mg total) by mouth 2 (two) times daily as needed for heartburn or indigestion. (Patient not taking: Reported on 02/04/2022), Disp: 60 tablet, Rfl: 5 ?  ibuprofen (ADVIL) 600 MG tablet, Take 1 tablet (600 mg total) by mouth every 6 (six) hours as needed (pain). (Patient not taking: Reported on 02/04/2022), Disp: 40 tablet, Rfl: 0 ?  lamoTRIgine (LAMICTAL) 25 MG tablet, Take 25 mg by mouth daily., Disp: , Rfl:  ?  oxyCODONE (ROXICODONE) 5 MG immediate release tablet, Take 1-2 tablets (5-10 mg total) by mouth every 6 (six) hours as needed for severe pain. (Patient not taking: Reported on 02/25/2022), Disp: 10 tablet, Rfl: 0 ?  Prenatal Vit-Fe Fumarate-FA (PRENATAL MULTIVITAMIN) TABS tablet, Take 1 tablet by mouth daily at 12 noon. (Patient not taking: Reported on 02/04/2022), Disp: , Rfl:  ? ?Observations/Objective: ?Patient is well-developed, well-nourished in no acute distress.  ?Resting comfortably  at home.  ?Head is normocephalic, atraumatic.  ?No labored breathing.  ?Speech is clear and coherent with logical content.  ?Patient is alert and oriented at baseline.  ? ? ?Assessment and Plan: ?1. Candidiasis of breast ? ?Keep area clean and dry. Cream as directed. Follow up with pcp if sx persist . ? ?Follow Up Instructions: ?I discussed the assessment and treatment plan with the patient. The patient was provided an opportunity to ask questions and all were answered. The patient agreed with the plan and demonstrated an understanding of the instructions.  A copy of  instructions were sent to the patient via MyChart unless otherwise noted below.  ? ? ? ?The patient was advised to call back or seek an in-person evaluation if the symptoms worsen or if the condition fails to improve as anticipated. ? ?Time:  ?I spent 10 minutes with the patient via telehealth technology discussing the above problems/concerns.   ? ?Dellia Nims, FNP  ?

## 2022-03-29 NOTE — Addendum Note (Signed)
Addended by: Kennieth Rad on: 03/29/2022 07:10 PM ? ? Modules accepted: Orders ? ?

## 2022-03-29 NOTE — Patient Instructions (Signed)
Rash, Adult A rash is a change in the color of your skin. A rash can also change the way your skin feels. There are many different conditions and factors that can cause a rash. Some rashes may disappear after a few days, but some may last for a few weeks. Common causes of rashes include: Viral infections, such as: Colds. Measles. Hand, foot, and mouth disease. Bacterial infections, such as: Scarlet fever. Impetigo. Fungal infections, such as Candida. Allergic reactions to food, medicines, or skin care products. Follow these instructions at home: The goal of treatment is to stop the itching and keep the rash from spreading. Pay attention to any changes in your symptoms. Follow these instructions to help with your condition: Medicine Take or apply over-the-counter and prescription medicines only as told by your health care provider. These may include: Corticosteroid creams to treat red or swollen skin. Anti-itch lotions. Oral allergy medicines (antihistamines). Oral corticosteroids for severe symptoms.  Skin care Apply cool compresses to the affected areas. Do not scratch or rub your skin. Avoid covering the rash. Make sure the rash is exposed to air as much as possible. Managing itching and discomfort Avoid hot showers or baths, which can make itching worse. A cold shower may help. Try taking a bath with: Epsom salts. Follow manufacturer instructions on the packaging. You can get these at your local pharmacy or grocery store. Baking soda. Pour a small amount into the bath as told by your health care provider. Colloidal oatmeal. Follow manufacturer instructions on the packaging. You can get this at your local pharmacy or grocery store. Try applying baking soda paste to your skin. Stir water into baking soda until it reaches a paste-like consistency. Try applying calamine lotion. This is an over-the-counter lotion that helps to relieve itchiness. Keep cool and out of the sun. Sweating  and being hot can make itching worse. General instructions  Rest as needed. Drink enough fluid to keep your urine pale yellow. Wear loose-fitting clothing. Avoid scented soaps, detergents, and perfumes. Use gentle soaps, detergents, perfumes, and other cosmetic products. Avoid any substance that causes your rash. Keep a journal to help track what causes your rash. Write down: What you eat. What cosmetic products you use. What you drink. What you wear. This includes jewelry. Keep all follow-up visits as told by your health care provider. This is important. Contact a health care provider if: You sweat at night. You lose weight. You urinate more than normal. You urinate less than normal, or you notice that your urine is a darker color than usual. You feel weak. You vomit. Your skin or the whites of your eyes look yellow (jaundice). Your skin: Tingles. Is numb. Your rash: Does not go away after several days. Gets worse. You are: Unusually thirsty. More tired than normal. You have: New symptoms. Pain in your abdomen. A fever. Diarrhea. Get help right away if you: Have a fever and your symptoms suddenly get worse. Develop confusion. Have a severe headache or a stiff neck. Have severe joint pains or stiffness. Have a seizure. Develop a rash that covers all or most of your body. The rash may or may not be painful. Develop blisters that: Are on top of the rash. Grow larger or grow together. Are painful. Are inside your nose or mouth. Develop a rash that: Looks like purple pinprick-sized spots all over your body. Has a "bull's eye" or looks like a target. Is not related to sun exposure, is red and painful, and causes   your skin to peel. Summary A rash is a change in the color of your skin. Some rashes disappear after a few days, but some may last for a few weeks. The goal of treatment is to stop the itching and keep the rash from spreading. Take or apply over-the-counter  and prescription medicines only as told by your health care provider. Contact a health care provider if you have new or worsening symptoms. Keep all follow-up visits as told by your health care provider. This is important. This information is not intended to replace advice given to you by your health care provider. Make sure you discuss any questions you have with your health care provider. Document Revised: 09/12/2021 Document Reviewed: 09/12/2021 Elsevier Patient Education  2023 [employer] Inc[employer]  

## 2022-03-31 ENCOUNTER — Encounter: Payer: Self-pay | Admitting: Obstetrics and Gynecology

## 2022-04-01 ENCOUNTER — Ambulatory Visit (HOSPITAL_BASED_OUTPATIENT_CLINIC_OR_DEPARTMENT_OTHER): Payer: 59 | Admitting: Certified Registered"

## 2022-04-01 ENCOUNTER — Other Ambulatory Visit: Payer: Self-pay

## 2022-04-01 ENCOUNTER — Encounter (HOSPITAL_BASED_OUTPATIENT_CLINIC_OR_DEPARTMENT_OTHER)
Admission: RE | Disposition: A | Discharge status: Home / Self Care | Payer: Self-pay | Source: Home / Self Care | Attending: Obstetrics and Gynecology

## 2022-04-01 ENCOUNTER — Ambulatory Visit (HOSPITAL_BASED_OUTPATIENT_CLINIC_OR_DEPARTMENT_OTHER)
Admission: RE | Admit: 2022-04-01 | Discharge: 2022-04-01 | Disposition: A | Discharge status: Home / Self Care | Payer: 59 | Attending: Obstetrics and Gynecology | Admitting: Obstetrics and Gynecology

## 2022-04-01 ENCOUNTER — Encounter (HOSPITAL_BASED_OUTPATIENT_CLINIC_OR_DEPARTMENT_OTHER): Payer: Self-pay | Admitting: Obstetrics and Gynecology

## 2022-04-01 DIAGNOSIS — K219 Gastro-esophageal reflux disease without esophagitis: Secondary | ICD-10-CM | POA: Insufficient documentation

## 2022-04-01 DIAGNOSIS — Z8759 Personal history of other complications of pregnancy, childbirth and the puerperium: Secondary | ICD-10-CM | POA: Diagnosis not present

## 2022-04-01 DIAGNOSIS — Z302 Encounter for sterilization: Secondary | ICD-10-CM | POA: Diagnosis present

## 2022-04-01 DIAGNOSIS — G4730 Sleep apnea, unspecified: Secondary | ICD-10-CM | POA: Insufficient documentation

## 2022-04-01 DIAGNOSIS — Z9889 Other specified postprocedural states: Secondary | ICD-10-CM

## 2022-04-01 DIAGNOSIS — Z01818 Encounter for other preprocedural examination: Secondary | ICD-10-CM

## 2022-04-01 DIAGNOSIS — J45909 Unspecified asthma, uncomplicated: Secondary | ICD-10-CM | POA: Insufficient documentation

## 2022-04-01 DIAGNOSIS — I10 Essential (primary) hypertension: Secondary | ICD-10-CM | POA: Diagnosis not present

## 2022-04-01 HISTORY — PX: LAPAROSCOPIC TUBAL LIGATION: SHX1937

## 2022-04-01 SURGERY — LIGATION, FALLOPIAN TUBE, LAPAROSCOPIC
Anesthesia: General | Site: Abdomen

## 2022-04-01 MED ORDER — ROCURONIUM BROMIDE 100 MG/10ML IV SOLN
INTRAVENOUS | Status: DC | PRN
Start: 2022-04-01 — End: 2022-04-01
  Administered 2022-04-01: 50 mg via INTRAVENOUS

## 2022-04-01 MED ORDER — SODIUM CHLORIDE 0.9 % IV SOLN: INTRAVENOUS | Status: DC

## 2022-04-01 MED ORDER — FENTANYL CITRATE (PF) 100 MCG/2ML IJ SOLN
INTRAMUSCULAR | Status: AC
Start: 2022-04-01 — End: ?
  Filled 2022-04-01: qty 2

## 2022-04-01 MED ORDER — SILVER NITRATE-POT NITRATE 75-25 % EX MISC
CUTANEOUS | Status: AC
  Filled 2022-04-01: qty 10

## 2022-04-01 MED ORDER — IBUPROFEN 600 MG PO TABS
600.0000 mg | ORAL_TABLET | Freq: Four times a day (QID) | ORAL | 0 refills | Status: DC | PRN
Start: 2022-04-01 — End: 2022-04-09

## 2022-04-01 MED ORDER — FENTANYL CITRATE (PF) 100 MCG/2ML IJ SOLN
25.0000 ug | INTRAMUSCULAR | Status: DC | PRN
  Administered 2022-04-01: 50 ug via INTRAVENOUS

## 2022-04-01 MED ORDER — FENTANYL CITRATE (PF) 100 MCG/2ML IJ SOLN
INTRAMUSCULAR | Status: AC
  Filled 2022-04-01: qty 2

## 2022-04-01 MED ORDER — DEXAMETHASONE SODIUM PHOSPHATE 4 MG/ML IJ SOLN
INTRAMUSCULAR | Status: DC | PRN
Start: 2022-04-01 — End: 2022-04-01
  Administered 2022-04-01: 10 mg via INTRAVENOUS

## 2022-04-01 MED ORDER — BUPIVACAINE HCL (PF) 0.5 % IJ SOLN
INTRAMUSCULAR | Status: AC
  Filled 2022-04-01: qty 30

## 2022-04-01 MED ORDER — SUGAMMADEX SODIUM 500 MG/5ML IV SOLN
INTRAVENOUS | Status: DC | PRN
  Administered 2022-04-01: 250 mg via INTRAVENOUS

## 2022-04-01 MED ORDER — MIDAZOLAM HCL 5 MG/5ML IJ SOLN
INTRAMUSCULAR | Status: DC | PRN
  Administered 2022-04-01: 2 mg via INTRAVENOUS

## 2022-04-01 MED ORDER — ONDANSETRON HCL 4 MG/2ML IJ SOLN
INTRAMUSCULAR | Status: AC
  Filled 2022-04-01: qty 2

## 2022-04-01 MED ORDER — LACTATED RINGERS IV SOLN: INTRAVENOUS | Status: DC

## 2022-04-01 MED ORDER — PROPOFOL 10 MG/ML IV BOLUS
INTRAVENOUS | Status: AC
  Filled 2022-04-01: qty 20

## 2022-04-01 MED ORDER — ONDANSETRON HCL 4 MG/2ML IJ SOLN
INTRAMUSCULAR | Status: DC | PRN
  Administered 2022-04-01: 4 mg via INTRAVENOUS

## 2022-04-01 MED ORDER — DEXAMETHASONE SODIUM PHOSPHATE 10 MG/ML IJ SOLN
INTRAMUSCULAR | Status: AC
  Filled 2022-04-01: qty 1

## 2022-04-01 MED ORDER — MIDAZOLAM HCL 2 MG/2ML IJ SOLN
INTRAMUSCULAR | Status: AC
  Filled 2022-04-01: qty 2

## 2022-04-01 MED ORDER — OXYCODONE HCL 5 MG PO TABS: 5.0000 mg | ORAL_TABLET | Freq: Four times a day (QID) | ORAL | 0 refills | Status: DC | PRN

## 2022-04-01 MED ORDER — DOCUSATE SODIUM 100 MG PO CAPS: 100.0000 mg | ORAL_CAPSULE | Freq: Two times a day (BID) | ORAL | 0 refills | Status: DC

## 2022-04-01 MED ORDER — LIDOCAINE 2% (20 MG/ML) 5 ML SYRINGE
INTRAMUSCULAR | Status: AC
  Filled 2022-04-01: qty 5

## 2022-04-01 MED ORDER — AMISULPRIDE (ANTIEMETIC) 5 MG/2ML IV SOLN: 10.0000 mg | Freq: Once | INTRAVENOUS | Status: DC | PRN

## 2022-04-01 MED ORDER — LIDOCAINE HCL (CARDIAC) PF 100 MG/5ML IV SOSY
PREFILLED_SYRINGE | INTRAVENOUS | Status: DC | PRN
  Administered 2022-04-01: 60 mg via INTRAVENOUS

## 2022-04-01 MED ORDER — BUPIVACAINE HCL 0.5 % IJ SOLN
INTRAMUSCULAR | Status: DC | PRN
  Administered 2022-04-01: 20 mL

## 2022-04-01 MED ORDER — PROPOFOL 10 MG/ML IV BOLUS
INTRAVENOUS | Status: DC | PRN
Start: 2022-04-01 — End: 2022-04-01
  Administered 2022-04-01: 200 mg via INTRAVENOUS

## 2022-04-01 MED ORDER — KETOROLAC TROMETHAMINE 30 MG/ML IJ SOLN
INTRAMUSCULAR | Status: DC | PRN
Start: 2022-04-01 — End: 2022-04-01
  Administered 2022-04-01: 30 mg via INTRAVENOUS

## 2022-04-01 MED ORDER — ACETAMINOPHEN 500 MG PO TABS
1000.0000 mg | ORAL_TABLET | Freq: Once | ORAL | Status: AC
  Administered 2022-04-01: 1000 mg via ORAL

## 2022-04-01 MED ORDER — ACETAMINOPHEN 500 MG PO TABS
ORAL_TABLET | ORAL | Status: AC
Start: 2022-04-01 — End: ?
  Filled 2022-04-01: qty 2

## 2022-04-01 MED ORDER — FENTANYL CITRATE (PF) 100 MCG/2ML IJ SOLN
INTRAMUSCULAR | Status: DC | PRN
  Administered 2022-04-01 (×3): 50 ug via INTRAVENOUS

## 2022-04-01 SURGICAL SUPPLY — 34 items
ADH SKN CLS APL DERMABOND .7 (GAUZE/BANDAGES/DRESSINGS) ×1
BLADE SURG 15 STRL LF DISP TIS (BLADE) ×1 IMPLANT
BLADE SURG 15 STRL SS (BLADE) ×2
CATH ROBINSON RED A/P 14FR (CATHETERS) IMPLANT
DERMABOND ADVANCED (GAUZE/BANDAGES/DRESSINGS) ×1
DERMABOND ADVANCED .7 DNX12 (GAUZE/BANDAGES/DRESSINGS) ×1 IMPLANT
DURAPREP 26ML APPLICATOR (WOUND CARE) ×2 IMPLANT
GAUZE 4X4 16PLY ~~LOC~~+RFID DBL (SPONGE) ×2 IMPLANT
GLOVE BIOGEL PI IND STRL 7.0 (GLOVE) ×2 IMPLANT
GLOVE BIOGEL PI IND STRL 7.5 (GLOVE) ×2 IMPLANT
GLOVE BIOGEL PI INDICATOR 7.0 (GLOVE) ×2
GLOVE BIOGEL PI INDICATOR 7.5 (GLOVE) ×2
GLOVE SURG SS PI 7.0 STRL IVOR (GLOVE) ×2 IMPLANT
GOWN STRL REUS W/ TWL LRG LVL3 (GOWN DISPOSABLE) ×1 IMPLANT
GOWN STRL REUS W/TWL LRG LVL3 (GOWN DISPOSABLE) ×2
GOWN STRL REUS W/TWL XL LVL3 (GOWN DISPOSABLE) ×2 IMPLANT
LIGASURE VESSEL 5MM BLUNT TIP (ELECTROSURGICAL) ×1 IMPLANT
PACK LAPAROSCOPY BASIN (CUSTOM PROCEDURE TRAY) ×2 IMPLANT
PACK TRENDGUARD 450 HYBRID PRO (MISCELLANEOUS) IMPLANT
PAD OB MATERNITY 4.3X12.25 (PERSONAL CARE ITEMS) ×2 IMPLANT
PAD PREP 24X48 CUFFED NSTRL (MISCELLANEOUS) ×2 IMPLANT
SET TUBE SMOKE EVAC HIGH FLOW (TUBING) ×2 IMPLANT
SLEEVE ENDOPATH XCEL 5M (ENDOMECHANICALS) ×1 IMPLANT
SLEEVE SCD COMPRESS KNEE MED (STOCKING) ×2 IMPLANT
SUT MNCRL AB 4-0 PS2 18 (SUTURE) ×2 IMPLANT
SUT VICRYL 0 UR6 27IN ABS (SUTURE) ×2 IMPLANT
TOWEL GREEN STERILE FF (TOWEL DISPOSABLE) ×2 IMPLANT
TRENDGUARD 450 HYBRID PRO PACK (MISCELLANEOUS)
TROCAR 5M 150ML BLDLS (TROCAR) IMPLANT
TROCAR BALLN 12MMX100 BLUNT (TROCAR) ×2 IMPLANT
TROCAR BALLN GELPORT 12X130M (ENDOMECHANICALS) IMPLANT
TROCAR XCEL NON-BLD 11X100MML (ENDOMECHANICALS) IMPLANT
TROCAR XCEL NON-BLD 5MMX100MML (ENDOMECHANICALS) ×1 IMPLANT
WARMER LAPAROSCOPE (MISCELLANEOUS) ×2 IMPLANT

## 2022-04-01 NOTE — Anesthesia Procedure Notes (Signed)
Procedure Name: Intubation ?Date/Time: 04/01/2022 10:56 AM ?Performed by: Maryella Shivers, CRNA ?Pre-anesthesia Checklist: Patient identified, Emergency Drugs available, Suction available and Patient being monitored ?Patient Re-evaluated:Patient Re-evaluated prior to induction ?Oxygen Delivery Method: Circle system utilized ?Preoxygenation: Pre-oxygenation with 100% oxygen ?Induction Type: IV induction ?Ventilation: Mask ventilation without difficulty ?Laryngoscope Size: Mac and 3 ?Grade View: Grade I ?Tube type: Oral ?Tube size: 7.0 mm ?Number of attempts: 1 ?Airway Equipment and Method: Stylet and Oral airway ?Placement Confirmation: ETT inserted through vocal cords under direct vision, positive ETCO2 and breath sounds checked- equal and bilateral ?Secured at: 21 cm ?Tube secured with: Tape ?Dental Injury: Teeth and Oropharynx as per pre-operative assessment  ? ? ? ? ?

## 2022-04-01 NOTE — H&P (Signed)
Obstetrics & Gynecology Surgical H&P   Date of Surgery: 04/01/2022    Primary OBGYN: [facility]  Reason for Admission: scheduled BTL  History of Present Illness: Ms. Wickes is a 36 y.o. P5P0051 (No LMP recorded.), with the above CC. PMHx is significant for bipolar, h/o gHTN, BMI 43  No sex since delivery in February. No period yet but spotting started so maybe about to start her cycle; she is currently formula feeding only.    ROS: A 12-point review of systems was performed and negative, except as stated in the above HPI.  OBGYN History: As per HPI. OB History  Gravida Para Term Preterm AB Living  8 6 5 1 2 6   SAB IAB Ectopic Multiple Live Births  2     0 6    # Outcome Date GA Lbr Len/2nd Weight Sex Delivery Anes PTL Lv  8 Term 01/28/22 15w0d03:17 / 00:05 3600 g F Vag-Spont EPI  LIV     Birth Comments: wnl  7 SAB 12/2020     SAB     6 Term 03/16/20 341w0d 00:14 3235 g F Vag-Spont EPI  LIV  5 SAB 05/2019          4 Term 12/17/18 3720w0d722 g F Vag-Spont   LIV     Birth Comments: Hypertension  3 Term 02/19/18 37w46w2d:02 / 01:46 3790 g M Vag-Spont EPI  LIV     Birth Comments: hypertension  2 Preterm 02/03/17 35w630w6d0 g M Vag-Spont EPI  LIV     Birth Comments: hypertension  1 Term 02/11/16 16w1d58w1d14 3702 g F Vag-Spont EPI  LIV     Birth Comments: preeclampsia    History of pap smears: Yes. Last pap smear 2021, negative cytology and hpv    Past Medical History: Past Medical History:  Diagnosis Date   Abnormal antenatal ultrasound 10/09/2021   Echogenic bowel of infant.    Anxiety    Asthma    inhaler PRN-hasn't used inhaler in months   Attention deficit hyperactivity disorder    [facility]   COVID-19 affecting puerperium 12/19/2021   Depression    no complaints, doing well   Gastritis    GERD (gastroesophageal reflux disease)    History of  gestational diabetes in prior pregnancy, currently pregnant 10/13/2017   History of gestational hypertension 07/03/2021   History of pre-eclampsia in prior pregnancy, currently pregnant 07/03/2021   History of preterm delivery after IOL for preeclampsia, currently pregnant 10/13/2017   Not a 17P candidate   Hx of varicella    Mild preeclampsia 02/09/2016   Nausea and vomiting during pregnancy 10/04/2019   Vaginal Pap smear, abnormal     Past Surgical History: Past Surgical History:  Procedure Laterality Date   DILATION AND EVACUATION N/A 01/08/2021   Procedure: DILATATION AND EVACUATION;  Surgeon: Ervin,Chancy [place];  Laterality: N/A;   EYE MUSCLE SURGERY Bilateral    07/29/12   MOUTH SURGERY     PLANTAR FASCIA SURGERY     WISDOM TOOTH EXTRACTION      Family History:  Family History  Problem Relation Age of Onset   Depression Mother    Colon cancer Mother    Colon polyps Mother    Depression Father    Lung cancer Father    Depression  Brother    Liver disease Other    Kidney disease Other     Social History:  Social History   Socioeconomic History   Marital status: Married    Spouse name: Not on file   Number of children: 0   Years of education: Not on file   Highest education level: Not on file  Occupational History   Not on file  Tobacco Use   Smoking status: Never   Smokeless tobacco: Never  Vaping Use   Vaping Use: Never used  Substance and Sexual Activity   Alcohol use: No    Alcohol/week: 0.0 standard drinks   Drug use: No   Sexual activity: Yes    Birth control/protection: None  Other Topics Concern   Not on file  Social History Narrative   Not on file   Social Determinants of Health   Financial Resource Strain: Not on file  Food Insecurity: No Food Insecurity   Worried About Running Out of Food in the Last Year: Never true   [place] in the Last Year: Never true   Transportation Needs: No Transportation Needs   Lack of Transportation (Medical): No   Lack of Transportation (Non-Medical): No  Physical Activity: Not on file  Stress: Not on file  Social Connections: Not on file  Intimate Partner Violence: Not on file     Allergy: Allergies  Allergen Reactions   Depakote [Divalproex Sodium] Other (See Comments)    Pt states that this medication makes her blood levels toxic.     Methylphenidate Derivatives Other (See Comments)    Reaction:  Depression and anger    Neurontin [Gabapentin] Other (See Comments)    Reaction:  Dizziness    Prozac [Fluoxetine Hcl] Other (See Comments)    Reaction:  Anger    Methylphenidate Hcl     SAME AS PREVIOUS LISTED METHYLPHENIDATE    Current Outpatient Medications: Medications Prior to Admission  Medication Sig Dispense Refill Last Dose   ARIPiprazole (ABILIFY IM) Inject 300 mg into the muscle every 30 (thirty) days.   Past Month   doxylamine, Sleep, (UNISOM) 25 MG tablet Take 25 mg by mouth at bedtime as needed.   Past Month   lamoTRIgine (LAMICTAL) 25 MG tablet Take 25 mg by mouth daily.   Past Month   acetaminophen (TYLENOL) 500 MG tablet Take 2 tablets (1,000 mg total) by mouth every 8 (eight) hours as needed (pain). (Patient not taking: Reported on 02/04/2022) 60 tablet 0 More than a month   albuterol (VENTOLIN HFA) 108 (90 Base) MCG/ACT inhaler Inhale 2 puffs into the lungs every 6 (six) hours as needed for wheezing or shortness of breath. (Patient not taking: Reported on 01/10/2022) 8 g 2 More than a month   Blood Pressure Monitoring (BLOOD PRESSURE KIT) DEVI 1 Device by Does not apply route once a week. (Patient not taking: Reported on 02/25/2022) 1 each 0    clotrimazole-betamethasone (LOTRISONE) cream Apply 1 application. topically 2 (two) times daily for 15 days. 30 g 0    famotidine (PEPCID) 20 MG tablet Take 1 tablet (20 mg total) by mouth 2 (two) times daily as needed for heartburn or indigestion.  (Patient not taking: Reported on 02/04/2022) 60 tablet 5 More than a month   ibuprofen (ADVIL) 600 MG tablet Take 1 tablet (600 mg total) by mouth every 6 (six) hours as needed (pain). (Patient not taking: Reported on 02/04/2022) 40 tablet 0 More than a month   oxyCODONE (ROXICODONE)  5 MG immediate release tablet Take 1-2 tablets (5-10 mg total) by mouth every 6 (six) hours as needed for severe pain. (Patient not taking: Reported on 02/25/2022) 10 tablet 0 More than a month   Prenatal Vit-Fe Fumarate-FA (PRENATAL MULTIVITAMIN) TABS tablet Take 1 tablet by mouth daily at 12 noon. (Patient not taking: Reported on 02/04/2022)   More than a month     Hospital Medications: Current Facility-Administered Medications  Medication Dose Route Frequency Provider Last Rate Last Admin   0.9 %  sodium chloride infusion   Intravenous Continuous Aletha Halim, MD       lactated ringers infusion   Intravenous Continuous Pervis Hocking, DO 10 mL/hr at 04/01/22 0946 New Bag at 04/01/22 0946     Physical Exam:  Current Vital Signs 24h Vital Sign Ranges  T 98.6 F (37 C) Temp  Avg: 98.6 F (37 C)  Min: 98.6 F (37 C)  Max: 98.6 F (37 C)  BP 132/78 BP  Min: 132/78  Max: 132/78  HR 76 Pulse  Avg: 76  Min: 76  Max: 76  RR 20 Resp  Avg: 20  Min: 20  Max: 20  SaO2 99 % Room Air SpO2  Avg: 99 %  Min: 99 %  Max: 99 %       24 Hour I/O Current Shift I/O  Time Ins Outs No intake/output data recorded. No intake/output data recorded.    Body mass index is 43.31 kg/m. General appearance: Well nourished, well developed female in no acute distress.  Cardiovascular: S1, S2 normal, no murmur, rub or gallop, regular rate and rhythm Respiratory:  Clear to auscultation bilateral. Normal respiratory effort Abdomen: positive bowel sounds and no masses, hernias; diffusely non tender to palpation, non distended Neuro/Psych:  Normal mood and affect.  Skin:  Warm and dry.  Extremities: no clubbing, cyanosis, or  edema.    Laboratory: UPT: pending  Imaging:  none  Assessment: Ms. Dwight is a 36 y.o. [id number] (No LMP recorded.) here for scheduled BTL. Patient doing well Plan: D/w her re: procedure again and she would like to proceed and salpingectomies. Post op instructions d/w her as well   Durene Romans MD Attending Center for [facility])

## 2022-04-01 NOTE — Anesthesia Preprocedure Evaluation (Signed)
Anesthesia Evaluation  ?Patient identified by MRN, date of birth, ID band ?Patient awake ? ? ? ?Reviewed: ?Allergy & Precautions, NPO status , Patient's Chart, lab work & pertinent test results ? ?Airway ?Mallampati: II ? ?TM Distance: >3 FB ?Neck ROM: Full ? ? ? Dental ? ?(+) Dental Advisory Given ?  ?Pulmonary ?asthma , sleep apnea ,  ?  ?breath sounds clear to auscultation ? ? ? ? ? ? Cardiovascular ?hypertension, Pt. on medications ? ?Rhythm:Regular Rate:Normal ? ? ?  ?Neuro/Psych ?negative neurological ROS ?   ? GI/Hepatic ?Neg liver ROS, GERD  ,  ?Endo/Other  ?negative endocrine ROS ? Renal/GU ?negative Renal ROS  ? ?  ?Musculoskeletal ? ? Abdominal ?  ?Peds ? Hematology ?negative hematology ROS ?(+)   ?Anesthesia Other Findings ? ? Reproductive/Obstetrics ? ?  ? ? ? ? ? ? ? ? ? ? ? ? ? ?  ?  ? ? ? ? ? ? ? ? ?Anesthesia Physical ?Anesthesia Plan ? ?ASA: 2 ? ?Anesthesia Plan: General  ? ?Post-op Pain Management: Tylenol PO (pre-op)* and Toradol IV (intra-op)*  ? ?Induction:  ? ?PONV Risk Score and Plan: 4 or greater and Midazolam, Dexamethasone, Ondansetron, Treatment may vary due to age or medical condition and Scopolamine patch - Pre-op ? ?Airway Management Planned: Oral ETT ? ?Additional Equipment: None ? ?Intra-op Plan:  ? ?Post-operative Plan: Extubation in OR ? ?Informed Consent: I have reviewed the patients History and Physical, chart, labs and discussed the procedure including the risks, benefits and alternatives for the proposed anesthesia with the patient or authorized representative who has indicated his/her understanding and acceptance.  ? ? ? ?Dental advisory given ? ?Plan Discussed with: CRNA ? ?Anesthesia Plan Comments:   ? ? ? ? ? ? ?Anesthesia Quick Evaluation ? ?

## 2022-04-01 NOTE — Anesthesia Postprocedure Evaluation (Signed)
Anesthesia Post Note ? ?Patient: [name] ? ?Procedure(s) Performed: LAPAROSCOPIC TUBAL LIGATION (Abdomen) ? ?  ? ?Patient location during evaluation: PACU ?Anesthesia Type: General ?Level of consciousness: awake and alert ?Pain management: pain level controlled ?Vital Signs Assessment: post-procedure vital signs reviewed and stable ?Respiratory status: spontaneous breathing, nonlabored ventilation, respiratory function stable and patient connected to nasal cannula oxygen ?Cardiovascular status: blood pressure returned to baseline and stable ?Postop Assessment: no apparent nausea or vomiting ?Anesthetic complications: no ? ? ?No notable events documented. ? ?Last Vitals:  ?Vitals:  ? 04/01/22 1230 04/01/22 1306  ?BP: 100/63 (!) 107/48  ?Pulse: 64 67  ?Resp: 14 18  ?Temp:  36.6 ?C  ?SpO2: 94% 93%  ?  ?Last Pain:  ?Vitals:  ? 04/01/22 1306  ?TempSrc: Oral  ?PainSc: 0-No pain  ? ? ?  ?  ?  ?  ?  ?  ? ?Suzette Battiest E ? ? ? ? ?

## 2022-04-01 NOTE — Op Note (Signed)
Operative Note  ? ?04/01/2022 ? ?PRE-OP DIAGNOSIS: Desire for permanent sterilization ?  ?POST-OP DIAGNOSIS: Same  ? ?SURGEON: Surgeon(s) and Role: ?   * Aletha Halim, MD - Primary ? ?ASSISTANT: None ? ?ANESTHESIA: General and local  ? ?PROCEDURE: laparoscopic bilateral tubal ligation ? ?ESTIMATED BLOOD LOSS: 32m ? ?DRAINS: 285mUOP via indwelling foley   ? ?TOTAL IV FLUIDS: per anesthesia note ? ?SPECIMENS: bilateral fallopian tubes  ? ?VTE PROPHYLAXIS: SCDs to the bilateral lower extremities ? ?ANTIBIOTICS: not indicated ? ?COMPLICATIONS: None ? ?DISPOSITION: PACU - hemodynamically stable. ? ?CONDITION: stable ? ?FINDINGS: Exam under anesthesia revealed an anteverted uterus approximately 6 week size, normal shape, and no adnexal masses. Laparoscopic survey of the abdomen revealed a grossly normal uterus, tubes, ovaries, liver, and stomach edge; no intra-abdominal adhesions were noted ? ?PROCEDURE IN DETAIL: The patient was taken to the OR where anesthesia was administed. The patient was positioned in dorsal lithotomy in the [place] was then examined under anesthesia with the above noted findings. The patient was prepped and draped in the normal sterile fashion and foley catheter was placed. A Graves speculum was placed in the vagina and the anterior lip of the cervix was grasped with a single toothed tenaculum.  A Hulka uterine manipulator was then inserted in the uterus and uterine mobility was found to be satisfactory; the speculum and tenaculum were then removed. ? ?After changing gloves, attention was turned to the patient?s abdomen where a 1271mkin incision was made in the umbilical fold, after injection of local anesthesia. Using the open technique, the abdomen was entered and the balloon trocar placed; pneumoperitoneum was then obtained. The operative laparoscope was introduced into the abdomen with the above noted findings, after inspection of the entry site and then placing the  patient in Trendelenburg. ?Next, the bilateral tubes were traced out to their respective fimbriae, with the above noted findings.  After injection of local anesthesia, a 5mm46mrt was placed in the LLQ and the suprapubic area under direct visualization. Using the Ligasure, the serial mesosalpinx on each side were serially cauterized and cut and transected at the cornua, with the tubes removed via the ports. Excellent hemostasis was noted. The pressure was then lowered to 6mmH74mnd excellent hemostasis noted from the operative sites.  ?  ?The 5mm p29ms were removed under direct visualization and the air released via the umbilical port. The fascia at the umbilical incision was reapproximated with 0 vicryl. The skin was closed with 4-0 monocryl. The Hulka was removed with no bleeding noted from the cervix and all other instrumentation was removed from the vagina.  The foley catheter was removed. The patient tolerated the procedure well. All counts were correct x 2. The patient was transferred to the recovery room awake, alert and breathing independently. ? ? ?CharliDurene Romansttending ?[facility] manager?

## 2022-04-01 NOTE — Discharge Instructions (Addendum)
Laparoscopic Surgery Discharge Instructions ? ?Instructions Following Major Laparoscopic Surgery ?You have just undergone a major laparoscopic surgery.  The following list should answer your most common questions.  Although we will discuss your surgery and post-operative instructions with you prior to your discharge, this list will serve as a reminder if you fail to recall the details of what we discussed. ? ?We will discuss your surgery once again in detail at your post-op visit in two to four weeks. If you haven?t already done so, please call to make your appointment as soon as possible. ? ?How you will feel: Although you have just undergone a major surgery, your recovery will be significantly shorter since the surgery was performed through much smaller incisions than the traditional approach.  You should feel slightly better each day.  If you suddenly feel much worse than the prior day, please call the clinic.  It?s important during the early part of your recovery that you maintain some activity.  Walking is encouraged.  You will quicken your recovery by continued activity. ? ?Incision:  Your incisions will be closed with dissolvable stitches or surgical adhesive (glue).  There may be Band-aids and/or Steri-strips covering your incisions.  If there is no drainage from the incisions you may remove the Band-aids in one to two days.  You may notice some minor bruising at the incision sites.  This is common and will resolve within several days.  Please inform us if the redness at the edges of your incision appears to be spreading.  If the skin around your incision becomes warm to the touch, or if you notice a pus-like drainage, please call the office. ? ?Stairs/Driving/Activities: You may climb stairs if necessary.  If you?ve had general anesthesia, do not drive a car the rest of the day today.  You may begin light housework when you feel up to it, but avoid heavy lifting (more than 15-20lbs) or pushing until cleared  for these activities by your physician. ? ?Hygiene:  Do not soak your incisions.  Showers are acceptable but you may not take a bath or swim in a pool.  Cleanse your incisions daily with soap and water. ? ?Medications:  Please resume taking any medications that you were taking prior to the surgery.  If we have prescribed any new medications for you, please take them as directed. ? ?Constipation:  It is fairly common to experience some difficulty in moving your bowels following major surgery.  Being active will help to reduce this likelihood. A diet rich in fiber and plenty of liquids is desirable.  If you do become constipated, a mild laxative such as Miralax, Milk of Magnesia, or Metamucil, or a stool softener such as Colace, is recommended. ? ?General Instructions: If you develop a fever of 100.5 degrees or higher, please call the office number(s) below for physician on call. ? ?**You may have Tylenol again today after 3:45pm, if needed.  You may have Ibuprofen/Motrin after 5:30pm today, if needed. ? ? ?Post Anesthesia Home Care Instructions ? ?Activity: ?Get plenty of rest for the remainder of the day. A responsible individual must stay with you for 24 hours following the procedure.  ?For the next 24 hours, DO NOT: ?-Drive a car ?-Paediatric nurse ?-Drink alcoholic beverages ?-Take any medication unless instructed by your physician ?-Make any legal decisions or sign important papers. ? ?Meals: ?Start with liquid foods such as gelatin or soup. Progress to regular foods as tolerated. Avoid greasy, spicy, heavy foods. If nausea  and/or vomiting occur, drink only clear liquids until the nausea and/or vomiting subsides. Call your physician if vomiting continues. ? ?Special Instructions/Symptoms: ?Your throat may feel dry or sore from the anesthesia or the breathing tube placed in your throat during surgery. If this causes discomfort, gargle with warm salt water. The discomfort should disappear within 24 hours. ? ?If  you had a scopolamine patch placed behind your ear for the management of post- operative nausea and/or vomiting: ? ?1. The medication in the patch is effective for 72 hours, after which it should be removed.  Wrap patch in a tissue and discard in the trash. Wash hands thoroughly with soap and water. ?2. You may remove the patch earlier than 72 hours if you experience unpleasant side effects which may include dry mouth, dizziness or visual disturbances. ?3. Avoid touching the patch. Wash your hands with soap and water after contact with the patch. ?    ? ? ?

## 2022-04-01 NOTE — Brief Op Note (Signed)
04/01/2022 ? ?11:50 AM ? ?PATIENT:  [name]  36 y.o. female ? ?PRE-OPERATIVE DIAGNOSIS:  Undesired Fertility ? ?POST-OPERATIVE DIAGNOSIS:  Undesired Fertility ? ?PROCEDURE:  laparoscopic bilateral salpingectomy ? ?SURGEON:  Surgeon(s) and Role: ?   * Aletha Halim, MD - Primary ? ?PHYSICIAN ASSISTANT: none ? ?ASSISTANTS: none  ? ?ANESTHESIA:   local and general ? ?EBL:  10 mL  ? ?BLOOD ADMINISTERED:none ? ?DRAINS:  indweling foley 75m UOP   ? ?LOCAL MEDICATIONS USED:  MARCAINE    ? ?SPECIMEN:  bilateral fallopian tubes ? ?DISPOSITION OF SPECIMEN:  PATHOLOGY ? ?COUNTS:  YES ? ?TOURNIQUET:  * No tourniquets in log * ? ?DICTATION: .Note written in EPIC ? ?PLAN OF CARE: Discharge to home after PACU ? ?PATIENT DISPOSITION:  PACU - hemodynamically stable. ?  ?Delay start of Pharmacological VTE agent (>24hrs) due to surgical blood loss or risk of bleeding: not applicable ? ?CDurene RomansMD ?Attending ?[facility] farm manager ?04/01/2022 ?Time: 1150am ? ?

## 2022-04-01 NOTE — Transfer of Care (Signed)
Immediate Anesthesia Transfer of Care Note ? ?Patient: [name] ? ?Procedure(s) Performed: LAPAROSCOPIC TUBAL LIGATION (Abdomen) ? ?Patient Location: PACU ? ?Anesthesia Type:General ? ?Level of Consciousness: awake, alert  and oriented ? ?Airway & Oxygen Therapy: Patient Spontanous Breathing and Patient connected to face mask oxygen ? ?Post-op Assessment: Report given to RN and Post -op Vital signs reviewed and stable ? ?Post vital signs: Reviewed and stable ? ?Last Vitals:  ?Vitals Value Taken Time  ?BP 118/66 04/01/22 1157  ?Temp    ?Pulse 72 04/01/22 1158  ?Resp    ?SpO2 99 % 04/01/22 1158  ? ? ?Last Pain:  ?Vitals:  ? 04/01/22 0933  ?TempSrc: Oral  ?PainSc: 0-No pain  ?   ? ?  ? ?Complications: No notable events documented. ?

## 2022-04-02 ENCOUNTER — Telehealth: Payer: Self-pay

## 2022-04-02 ENCOUNTER — Encounter: Payer: Self-pay | Admitting: Obstetrics and Gynecology

## 2022-04-02 ENCOUNTER — Encounter (HOSPITAL_BASED_OUTPATIENT_CLINIC_OR_DEPARTMENT_OTHER): Payer: Self-pay | Admitting: Obstetrics and Gynecology

## 2022-04-02 LAB — SURGICAL PATHOLOGY

## 2022-04-02 NOTE — Telephone Encounter (Addendum)
Pt call transferred from the front office.  Pt reports that she is having severe pain and the Oxycodone is not effective.  Pt informs me that her last dose of Oxy was 4 hrs ago and ibuprofen 2 hrs ago.  Reviewed with Dr. Kennon Rounds who permitted pt to take another Oxycodone now then ibuprofen 600 mg table one hour later and then every 6 hours after that.  Pt asks what if that does not work.  I explained to the pt that it should work as it should get through the  breakthrough pain then the ibuprofen should keep it to tolerable levels.  I informed pt that I have reached out to Dr. Ilda Basset to see his recommendation.  Per Dr. Ilda Basset pt needs to be seen if the medication provided does not help with her pain.  Pt verbalized understanding to provider recommendation.  ? ?Doran Nestle,RN  ?

## 2022-04-04 ENCOUNTER — Ambulatory Visit (INDEPENDENT_AMBULATORY_CARE_PROVIDER_SITE_OTHER): Payer: 59 | Admitting: Orthopedic Surgery

## 2022-04-04 DIAGNOSIS — R202 Paresthesia of skin: Secondary | ICD-10-CM

## 2022-04-04 DIAGNOSIS — R200 Anesthesia of skin: Secondary | ICD-10-CM | POA: Insufficient documentation

## 2022-04-04 NOTE — Progress Notes (Signed)
? ?Office Visit Note ?  ?Patient: [name]           ?Date of Birth: 12-30-1985           ?MRN: [id number] ?Visit Date: 04/04/2022 ?             ?Requested by: No referring provider defined for this encounter. ?PCP: Patient, No Pcp Per (Inactive) ? ? ?Assessment & Plan: ?Visit Diagnoses:  ?1. Numbness and tingling in right hand   ? ? ?Plan: Discussed with patient that her symptoms seem consistent with carpal tunnel syndrome.  We discussed the nature of carpal tunnel syndrome as well as its diagnosis, prognosis and both conservative and surgical treatment options.  I would like to get an EMG/nerve conduction study to further evaluate her symptoms.  I will see her back in the office once the study is completed. ? ?Follow-Up Instructions: No follow-ups on file.  ? ?Orders:  ?No orders of the defined types were placed in this encounter. ? ?No orders of the defined types were placed in this encounter. ? ? ? ? Procedures: ?No procedures performed ? ? ?Clinical Data: ?No additional findings. ? ? ?Subjective: ?Chief Complaint  ?Patient presents with  ? Left Hand - Numbness, Weakness  ?  RIGHT handed, pain: 8/10, goes numb when she is sleeping  ? ? ?This is a 36 year old right-hand-dominant female presents with numbness and paresthesias involving multiple fingers of the right hand.  This been going on for several months now.  Her symptoms mostly involve her thumb, index, middle, and ring fingers.  Small finger is seldom involved.  She describes numbness and tingling that occurs during certain points during the day.  She has mild tingling in the office currently.  She describes nocturnal symptoms 4 times per week in which she wakes up with burning pain and paresthesias in the previously mentioned fingers.  She says she holds her hand under the level of the bed for symptom relief.  She denies any weakness in the hand.  She has worn a wrist brace without symptom relief.  She never had an EMG or nerve conduction study.   She has no history of diabetes, hypothyroidism, wrist trauma, cervical spine issue, or inflammatory arthropathy. ? ?Weakness ?Associated symptoms include weakness.  ? ?Review of Systems  ?Neurological:  Positive for weakness.  ? ? ?Objective: ?Vital Signs: There were no vitals taken for this visit. ? ?Physical Exam ?Constitutional:   ?   Appearance: Normal appearance.  ?Cardiovascular:  ?   Rate and Rhythm: Normal rate.  ?   Pulses: Normal pulses.  ?Pulmonary:  ?   Effort: Pulmonary effort is normal.  ?Skin: ?   General: Skin is warm and dry.  ?   Capillary Refill: Capillary refill takes less than 2 seconds.  ?Neurological:  ?   Mental Status: She is alert.  ? ? ?Right Hand Exam  ? ?Tenderness  ?The patient is experiencing no tenderness.  ? ?Range of Motion  ?The patient has normal right wrist ROM.  ? ?Other  ?Erythema: absent ?Sensation: normal ?Pulse: present ? ?Comments:  Positive Tinel, Phalen, and Durkan signs.  5/5 thenar motor strength without atrophy.  Able to make a complete fist and fully extend all fingers.  5/5 FDI strength.  ? ? ? ? ?Specialty Comments:  ?No specialty comments available. ? ?Imaging: ?No results found. ? ? ?PMFS History: ?Patient Active Problem List  ? Diagnosis Date Noted  ? Numbness and tingling in right hand  04/04/2022  ? Unwanted fertility 12/19/2021  ? Gestational hypertension 03/16/2020  ? 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 Anxiety state 03/27/2014  ? OSA (obstructive sleep apnea) 09/15/2013  ? Asthma 05/21/2013  ? 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 Intellectual disability 09/15/2012  ? Obesity (BMI 30-39.9) 08/03/2012  ? Bipolar affective disorder, depressed, moderate degree ([place]) 06/02/2012  ?  Class: Acute  ? GERD (gastroesophageal reflux disease) 05/19/2012  ? Borderline behavior 04/12/2012  ?  Class: Acute  ? ?Past Medical History:  ?Diagnosis Date  ? Abnormal antenatal ultrasound 10/09/2021  ?  Echogenic bowel of infant.   ? Anxiety   ? Asthma   ? inhaler PRN-hasn't used inhaler in months  ? Attention deficit hyperactivity disorder   ? Bipolar disorder ([place])   ? hospitalized as a teen for suicidal ideations and cutting  ? COVID-19 affecting puerperium 12/19/2021  ? Depression   ? no complaints, doing well  ? Gastritis   ? GERD (gastroesophageal reflux disease)   ? History of gestational diabetes in prior pregnancy, currently pregnant 10/13/2017  ? History of gestational hypertension 07/03/2021  ? History of pre-eclampsia in prior pregnancy, currently pregnant 07/03/2021  ? History of preterm delivery after IOL for preeclampsia, currently pregnant 10/13/2017  ? Not a 17P candidate  ? Hx of varicella   ? Mild preeclampsia 02/09/2016  ? Nausea and vomiting during pregnancy 10/04/2019  ? Vaginal Pap smear, abnormal   ?  ?Family History  ?Problem Relation Age of Onset  ? Depression Mother   ? Colon cancer Mother   ? Colon polyps Mother   ? Depression Father   ? Lung cancer Father   ? Depression Brother   ? Liver disease Other   ? Kidney disease Other   ?  ?Past Surgical History:  ?Procedure Laterality Date  ? DILATION AND EVACUATION N/A 01/08/2021  ? Procedure: DILATATION AND EVACUATION;  Surgeon: Chancy Milroy, MD;  Location: [facility];  Service: Gynecology;  Laterality: N/A;  ? EYE MUSCLE SURGERY Bilateral   ? 07/29/12  ? LAPAROSCOPIC TUBAL LIGATION N/A 04/01/2022  ? Procedure: LAPAROSCOPIC TUBAL LIGATION;  Surgeon: Aletha Halim, MD;  Location: [facility];  Service: Gynecology;  Laterality: N/A;  ? MOUTH SURGERY    ? PLANTAR FASCIA SURGERY    ? WISDOM TOOTH EXTRACTION    ? ?Social History  ? ?Occupational History  ? Not on file  ?Tobacco Use  ? Smoking status: Never  ? Smokeless tobacco: Never  ?Vaping Use  ? Vaping Use: Never used  ?Substance and Sexual Activity  ? Alcohol use: No  ?  Alcohol/week: 0.0 standard drinks  ? Drug use: No  ? Sexual activity: Yes  ?  Birth  control/protection: None  ? ? ? ? ? ? ?

## 2022-04-07 ENCOUNTER — Telehealth: Payer: 59 | Admitting: Physician Assistant

## 2022-04-07 DIAGNOSIS — J069 Acute upper respiratory infection, unspecified: Secondary | ICD-10-CM

## 2022-04-07 MED ORDER — PROMETHAZINE-DM 6.25-15 MG/5ML PO SYRP: 5.0000 mL | ORAL_SOLUTION | Freq: Four times a day (QID) | ORAL | 0 refills | Status: DC | PRN

## 2022-04-07 MED ORDER — IPRATROPIUM BROMIDE 0.03 % NA SOLN: 2.0000 | Freq: Two times a day (BID) | NASAL | 0 refills | Status: DC

## 2022-04-07 NOTE — Progress Notes (Signed)
?Virtual Visit Consent  ? ?[name], you are scheduled for a virtual visit with a [facility] provider today.   ?  ?Just as with appointments in the office, your consent must be obtained to participate.  Your consent will be active for this visit and any virtual visit you may have with one of our providers in the next 365 days.   ?  ?If you have a MyChart account, a copy of this consent can be sent to you electronically.  All virtual visits are billed to your insurance company just like a traditional visit in the office.   ? ?As this is a virtual visit, video technology does not allow for your provider to perform a traditional examination.  This may limit your provider's ability to fully assess your condition.  If your provider identifies any concerns that need to be evaluated in person or the need to arrange testing (such as labs, EKG, etc.), we will make arrangements to do so.   ?  ?Although advances in technology are sophisticated, we cannot ensure that it will always work on either your end or our end.  If the connection with a video visit is poor, the visit may have to be switched to a telephone visit.  With either a video or telephone visit, we are not always able to ensure that we have a secure connection.    ? ?Also, by engaging in this virtual visit, you consent to the provision of healthcare. Additionally, you authorize for your insurance to be billed (if applicable) for the services provided during this visit.  ? ?I need to obtain your verbal consent now.   Are you willing to proceed with your visit today?  ?  ?[name] has provided verbal consent on 04/07/2022 for a virtual visit (video or telephone). ?  ?Nichole Rio, PA-C  ? ?Date: 04/07/2022 6:34 PM ? ? ?Virtual Visit via Video Note  ? ?[name], connected with  [name]  ([id number], [birth year]) on 04/07/22 at  6:30 PM EDT by a video-enabled telemedicine application and verified that I am speaking with  the correct person using two identifiers. ? ?Location: ?Patient: Virtual Visit Location Patient: Home ?Provider: Virtual Visit Location Provider: Home Office ?  ?I discussed the limitations of evaluation and management by telemedicine and the availability of in person appointments. The patient expressed understanding and agreed to proceed.   ? ?History of Present Illness: ?[name] is a 36 y.o. who identifies as a female who was assigned female at birth, and is being seen today for nasal and head congestion starting this morning along with sore throat. Denies fever, chills, aches. Denies ear pain or tooth pain. Notes ear pressure bilaterally. Denies chest congestion but does not cough that is productive of her PND. Denies recent travel or sick contact. Has not taken anything for symptoms.  ? ?HPI: HPI  ?Problems:  ?Patient Active Problem List  ? Diagnosis Date Noted  ? Numbness and tingling in right hand 04/04/2022  ? Unwanted fertility 12/19/2021  ? Gestational hypertension 03/16/2020  ? 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 Anxiety state 03/27/2014  ? OSA (obstructive sleep apnea) 09/15/2013  ? Asthma 05/21/2013  ? 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 Intellectual disability 09/15/2012  ? Obesity (BMI 30-39.9) 08/03/2012  ? Bipolar affective disorder, depressed, moderate degree ([place]) 06/02/2012  ?  Class: Acute  ? GERD (gastroesophageal reflux disease)  05/19/2012  ? Borderline behavior 04/12/2012  ?  Class: Acute  ?  ?Allergies:  ?Allergies  ?Allergen Reactions  ? Depakote [Divalproex Sodium] Other (See Comments)  ?  Pt states that this medication makes her blood levels toxic.    ? Methylphenidate Derivatives Other (See Comments)  ?  Reaction:  Depression and anger   ? Neurontin [Gabapentin] Other (See Comments)  ?  Reaction:  Dizziness   ? Prozac [Fluoxetine Hcl] Other (See Comments)  ?  Reaction:  Anger   ? Methylphenidate  Hcl   ?  SAME AS PREVIOUS LISTED METHYLPHENIDATE  ? ?Medications:  ?Current Outpatient Medications:  ?  ipratropium (ATROVENT) 0.03 % nasal spray, Place 2 sprays into both nostrils every 12 (twelve) hours., Disp: 30 mL, Rfl: 0 ?  promethazine-dextromethorphan (PROMETHAZINE-DM) 6.25-15 MG/5ML syrup, Take 5 mLs by mouth 4 (four) times daily as needed for cough., Disp: 118 mL, Rfl: 0 ?  acetaminophen (TYLENOL) 500 MG tablet, Take 2 tablets (1,000 mg total) by mouth every 8 (eight) hours as needed (pain). (Patient not taking: Reported on 02/04/2022), Disp: 60 tablet, Rfl: 0 ?  albuterol (VENTOLIN HFA) 108 (90 Base) MCG/ACT inhaler, Inhale 2 puffs into the lungs every 6 (six) hours as needed for wheezing or shortness of breath. (Patient not taking: Reported on 01/10/2022), Disp: 8 g, Rfl: 2 ?  ALPRAZolam (XANAX) 0.5 MG tablet, Take 0.5 mg by mouth daily as needed., Disp: , Rfl:  ?  ARIPiprazole (ABILIFY IM), Inject 300 mg into the muscle every 30 (thirty) days., Disp: , Rfl:  ?  Blood Pressure Monitoring (BLOOD PRESSURE KIT) DEVI, 1 Device by Does not apply route once a week. (Patient not taking: Reported on 02/25/2022), Disp: 1 each, Rfl: 0 ?  clotrimazole-betamethasone (LOTRISONE) cream, Apply 1 application. topically 2 (two) times daily for 15 days., Disp: 30 g, Rfl: 0 ?  docusate sodium (COLACE) 100 MG capsule, Take 1 capsule (100 mg total) by mouth 2 (two) times daily., Disp: 30 capsule, Rfl: 0 ?  doxylamine, Sleep, (UNISOM) 25 MG tablet, Take 25 mg by mouth at bedtime as needed., Disp: , Rfl:  ?  famotidine (PEPCID) 20 MG tablet, Take 1 tablet (20 mg total) by mouth 2 (two) times daily as needed for heartburn or indigestion. (Patient not taking: Reported on 02/04/2022), Disp: 60 tablet, Rfl: 5 ?  ibuprofen (ADVIL) 600 MG tablet, Take 1 tablet (600 mg total) by mouth every 6 (six) hours as needed (pain)., Disp: 30 tablet, Rfl: 0 ?  lamoTRIgine (LAMICTAL) 25 MG tablet, Take 25 mg by mouth daily., Disp: , Rfl:  ?   oxyCODONE (ROXICODONE) 5 MG immediate release tablet, Take 1 tablet (5 mg total) by mouth every 6 (six) hours as needed for severe pain., Disp: 5 tablet, Rfl: 0 ?  VRAYLAR 1.5 MG capsule, Take 1.5 mg by mouth daily., Disp: , Rfl:  ? ?Observations/Objective: ?Patient is well-developed, well-nourished in no acute distress.  ?Resting comfortably at home.  ?Head is normocephalic, atraumatic.  ?No labored breathing. ?Speech is clear and coherent with logical content.  ?Patient is alert and oriented at baseline.  ? ?Assessment and Plan: ?1. Viral URI with cough ?- ipratropium (ATROVENT) 0.03 % nasal spray; Place 2 sprays into both nostrils every 12 (twelve) hours.  Dispense: 30 mL; Refill: 0 ?- promethazine-dextromethorphan (PROMETHAZINE-DM) 6.25-15 MG/5ML syrup; Take 5 mLs by mouth 4 (four) times daily as needed for cough.  Dispense: 118 mL; Refill: 0 ? ?Supportive measures and OTC medications reviewed. Start Atrovent  nasal spray, OTC Mucinex. Promethazine-DM per orders. Follow-up if symptoms are not easing up over next 4-5 days or for any new or worsening symptoms.  ? ?Follow Up Instructions: ?I discussed the assessment and treatment plan with the patient. The patient was provided an opportunity to ask questions and all were answered. The patient agreed with the plan and demonstrated an understanding of the instructions.  A copy of instructions were sent to the patient via MyChart unless otherwise noted below.  ? ?The patient was advised to call back or seek an in-person evaluation if the symptoms worsen or if the condition fails to improve as anticipated. ? ?Time:  ?I spent 10 minutes with the patient via telehealth technology discussing the above problems/concerns.   ? ?Nichole Rio, PA-C ?

## 2022-04-07 NOTE — Patient Instructions (Addendum)
?[name], thank you for joining [name], PA-C for today's virtual visit.  While this provider is not your primary care provider (PCP), if your PCP is located in our provider database this encounter information will be shared with them immediately following your visit. ? ?Consent: ?(Patient) [name] provided verbal consent for this virtual visit at the beginning of the encounter. ? ?Current Medications: ? ?Current Outpatient Medications:  ?  acetaminophen (TYLENOL) 500 MG tablet, Take 2 tablets (1,000 mg total) by mouth every 8 (eight) hours as needed (pain). (Patient not taking: Reported on 02/04/2022), Disp: 60 tablet, Rfl: 0 ?  albuterol (VENTOLIN HFA) 108 (90 Base) MCG/ACT inhaler, Inhale 2 puffs into the lungs every 6 (six) hours as needed for wheezing or shortness of breath. (Patient not taking: Reported on 01/10/2022), Disp: 8 g, Rfl: 2 ?  ALPRAZolam (XANAX) 0.5 MG tablet, Take 0.5 mg by mouth daily as needed., Disp: , Rfl:  ?  ARIPiprazole (ABILIFY IM), Inject 300 mg into the muscle every 30 (thirty) days., Disp: , Rfl:  ?  Blood Pressure Monitoring (BLOOD PRESSURE KIT) DEVI, 1 Device by Does not apply route once a week. (Patient not taking: Reported on 02/25/2022), Disp: 1 each, Rfl: 0 ?  clotrimazole-betamethasone (LOTRISONE) cream, Apply 1 application. topically 2 (two) times daily for 15 days., Disp: 30 g, Rfl: 0 ?  docusate sodium (COLACE) 100 MG capsule, Take 1 capsule (100 mg total) by mouth 2 (two) times daily., Disp: 30 capsule, Rfl: 0 ?  doxylamine, Sleep, (UNISOM) 25 MG tablet, Take 25 mg by mouth at bedtime as needed., Disp: , Rfl:  ?  famotidine (PEPCID) 20 MG tablet, Take 1 tablet (20 mg total) by mouth 2 (two) times daily as needed for heartburn or indigestion. (Patient not taking: Reported on 02/04/2022), Disp: 60 tablet, Rfl: 5 ?  ibuprofen (ADVIL) 600 MG tablet, Take 1 tablet (600 mg total) by mouth every 6 (six) hours as needed (pain)., Disp: 30 tablet, Rfl:  0 ?  lamoTRIgine (LAMICTAL) 25 MG tablet, Take 25 mg by mouth daily., Disp: , Rfl:  ?  oxyCODONE (ROXICODONE) 5 MG immediate release tablet, Take 1 tablet (5 mg total) by mouth every 6 (six) hours as needed for severe pain., Disp: 5 tablet, Rfl: 0 ?  VRAYLAR 1.5 MG capsule, Take 1.5 mg by mouth daily., Disp: , Rfl:   ? ?Medications ordered in this encounter:  ?No orders of the defined types were placed in this encounter. ?  ? ?*If you need refills on other medications prior to your next appointment, please contact your pharmacy* ? ?Follow-Up: ?Call back or seek an in-person evaluation if the symptoms worsen or if the condition fails to improve as anticipated. ? ?Other Instructions ?E-Visit for Sinus Problems ? ?We are sorry that you are not feeling well.  Here is how we plan to help! ? ?Based on what you have shared with me it looks like you have sinusitis.  Sinusitis is inflammation and infection in the sinus cavities of the head.  Based on your presentation I believe you most likely have Acute Viral Sinusitis.This is an infection most likely caused by a virus. There is not specific treatment for viral sinusitis other than to help you with the symptoms until the infection runs its course.  You may use an oral decongestant such as plain Mucinex. Saline nasal spray help and can safely be used as often as needed for congestion. ? ?Use the Ipratropium spray as directed.  Use cough syrup as directed when needed.  ? ?Some authorities believe that zinc sprays or the use of Echinacea may shorten the course of your symptoms. ? ?Sinus infections are not as easily transmitted as other respiratory infection, however we still recommend that you avoid close contact with loved ones, especially the very young and elderly.  Remember to wash your hands thoroughly throughout the day as this is the number one way to prevent the spread of infection! ? ?Home Care: ?Only take medications as instructed by your medical team. ?Do not take  these medications with alcohol. ?A steam or ultrasonic humidifier can help congestion.  You can place a towel over your head and breathe in the steam from hot water coming from a faucet. ?Avoid close contacts especially the very young and the elderly. ?Cover your mouth when you cough or sneeze. ?Always remember to wash your hands. ? ?Get Help Right Away If: ?You develop worsening fever or sinus pain. ?You develop a severe head ache or visual changes. ?Your symptoms persist after you have completed your treatment plan. ? ?Make sure you ?Understand these instructions. ?Will watch your condition. ?Will get help right away if you are not doing well or get worse. ? ? ?Thank you for choosing an e-visit. ? ?Your e-visit answers were reviewed by a board certified advanced clinical practitioner to complete your personal care plan. Depending upon the condition, your plan could have included both over the counter or prescription medications. ? ?Please review your pharmacy choice. Make sure the pharmacy is open so you can pick up prescription now. If there is a problem, you may contact your provider through [employer] and have the prescription routed to another pharmacy.  Your safety is important to [place]. If you have drug allergies check your prescription carefully.  ? ?For the next 24 hours you can use MyChart to ask questions about today's visit, request a non-urgent call back, or ask for a work or school excuse. ?You will get an email in the next two days asking about your experience. I hope that your e-visit has been valuable and will speed your recovery. ? ? ? ? ?If you have been instructed to have an in-person evaluation today at a local Urgent Care facility, please use the link below. It will take you to a list of all of our available [facility], including address, phone number and hours of operation. Please do not delay care.  ?[facility] ? ?If you or a family member do not have a  primary care provider, use the link below to schedule a visit and establish care. When you choose a [facility] primary care physician or advanced practice provider, you gain a long-term partner in health. ?Find a Primary Care Provider ? ?Learn more about 's in-office and virtual care options: ?[facility]  ?

## 2022-04-08 NOTE — Progress Notes (Signed)
Called patient to follow up on MyChart messages. Patient reports rotating Tylenol and ibuprofen every 2.5-3 hours. Also has trialed taking oxycodone at same time as Tylenol with no improvement. Oxcodone completed yesterday or the day before. Reports 3-4 bowel movements since surgery. Reports bowel movement today; describes as "regular." Patient is s/p BTL on 04/01/22. Reviewed with Ilda Basset MD who recommends patient come for visit tomorrow with surgeon. Permission for [place] given by Elgie Congo, MD.  ?

## 2022-04-09 ENCOUNTER — Encounter: Payer: Self-pay | Admitting: Obstetrics and Gynecology

## 2022-04-09 ENCOUNTER — Ambulatory Visit (INDEPENDENT_AMBULATORY_CARE_PROVIDER_SITE_OTHER): Payer: 59 | Admitting: Obstetrics and Gynecology

## 2022-04-09 VITALS — BP 124/82 | HR 77 | Temp 98.9°F | Wt 240.4 lb

## 2022-04-09 DIAGNOSIS — R1084 Generalized abdominal pain: Secondary | ICD-10-CM

## 2022-04-09 DIAGNOSIS — R109 Unspecified abdominal pain: Secondary | ICD-10-CM | POA: Diagnosis not present

## 2022-04-09 DIAGNOSIS — G8918 Other acute postprocedural pain: Secondary | ICD-10-CM

## 2022-04-09 MED ORDER — IBUPROFEN 800 MG PO TABS: 800.0000 mg | ORAL_TABLET | Freq: Three times a day (TID) | ORAL | 1 refills | Status: DC | PRN

## 2022-04-09 MED ORDER — ACETAMINOPHEN-CODEINE #3 300-30 MG PO TABS: 1.0000 | ORAL_TABLET | Freq: Four times a day (QID) | ORAL | 0 refills | Status: AC | PRN

## 2022-04-09 NOTE — Progress Notes (Signed)
?  CC:  post operative pain ?Subjective:  ? ? Patient ID: [name], female    DOB: June 27, 1986, 36 y.o.   MRN: [id number] ? ?HPI ?Pt seen for report of postoperative pain more than what she expected.  Pt had laparoscopic salpingectomy on 04/01/22.  Op note reviewed. She states the pain is intermittent and crampy.  There is no nausea and vomiting.  She is tolerating regular diet.  She is passing gas and has had bowel movements.  Pt has been taking ibuprofen 600 mg and has used up her oxycodone.  She states neither medicine really helped.  Subjectively the patient looks well. ? ? ?Review of Systems ? ?   ?Objective:  ? Physical Exam ?Vitals reviewed.  ?Constitutional:   ?   General: She is not in acute distress. ?   Appearance: Normal appearance. She is obese. She is not ill-appearing.  ?HENT:  ?   Head: Normocephalic and atraumatic.  ?Cardiovascular:  ?   Rate and Rhythm: Normal rate and regular rhythm.  ?Pulmonary:  ?   Effort: Pulmonary effort is normal.  ?   Breath sounds: Normal breath sounds.  ?Abdominal:  ?   General: Abdomen is flat. There is no distension.  ?   Palpations: Abdomen is soft.  ?   Tenderness: There is no guarding.  ?   Comments: Mild tenderness in the left lower quadrant  ?Musculoskeletal:     ?   General: Normal range of motion.  ?Skin: ?   Comments: All surgical/trochar sites look clean, dry and intact  ?Neurological:  ?   Mental Status: She is alert.  ? ?Vitals:  ? 04/09/22 1032  ?BP: 124/82  ?Pulse: 77  ?Temp: 98.9 ?F (37.2 ?C)  ?SpO2: 100%  ? ? ? ? ?   ?Assessment & Plan:  ? ?1. Postoperative abdominal pain ?I believe this is routine level of pain, but patient has never really had abdominal surgery so this is new to her. ?Somewhat wary of prescribing more narcotics.  Will give 3 days worth of tylenol # 3 with increased dose of ibuprofen.  Pt advised to follow up at ER or urgent care if pain worsens.  Follow up with Dr. Ilda Basset as scheduled. ? ?- ibuprofen (ADVIL) 800 MG tablet; Take 1  tablet (800 mg total) by mouth 3 (three) times daily with meals as needed for headache, moderate pain or cramping.  Dispense: 30 tablet; Refill: 1 ?- acetaminophen-codeine (TYLENOL #3) 300-30 MG tablet; Take 1-2 tablets by mouth every 6 (six) hours as needed for up to 3 days for moderate pain.  Dispense: 24 tablet; Refill: 0 ?- CBC ? ?2. Generalized abdominal pain ?Will check CT scan to evaluate any intraabdominal issues. ? ?- CT ABDOMEN PELVIS W WO CONTRAST; Future ? ? ? ?Griffin Basil, MD ?Faculty Attending, [facility]  ?

## 2022-04-10 LAB — CBC
Hematocrit: 39.3 % (ref 34.0–46.6)
Hemoglobin: 12.5 g/dL (ref 11.1–15.9)
MCH: 27.4 pg (ref 26.6–33.0)
MCHC: 31.8 g/dL (ref 31.5–35.7)
MCV: 86 fL (ref 79–97)
Platelets: 233 10*3/uL (ref 150–450)
RBC: 4.56 x10E6/uL (ref 3.77–5.28)
RDW: 15.1 % (ref 11.7–15.4)
WBC: 5.4 10*3/uL (ref 3.4–10.8)

## 2022-04-17 ENCOUNTER — Ambulatory Visit (HOSPITAL_COMMUNITY): Admission: RE | Admit: 2022-04-17 | Payer: 59 | Source: Ambulatory Visit

## 2022-05-01 ENCOUNTER — Ambulatory Visit: Payer: 59 | Admitting: Obstetrics and Gynecology

## 2022-05-01 ENCOUNTER — Encounter: Payer: Self-pay | Admitting: Obstetrics and Gynecology

## 2022-05-04 ENCOUNTER — Telehealth: Payer: 59 | Admitting: Physician Assistant

## 2022-05-04 DIAGNOSIS — L309 Dermatitis, unspecified: Secondary | ICD-10-CM

## 2022-05-04 MED ORDER — NYSTATIN-TRIAMCINOLONE 100000-0.1 UNIT/GM-% EX OINT: 1.0000 "application " | TOPICAL_OINTMENT | Freq: Two times a day (BID) | CUTANEOUS | 0 refills | Status: DC

## 2022-05-04 NOTE — Patient Instructions (Signed)
[facility], thank you for joining [employer], PA-C for today's virtual visit.  While this provider is not your primary care provider (PCP), if your PCP is located in our provider database this encounter information will be shared with them immediately following your visit.  Consent: (Patient) [name] provided verbal consent for this virtual visit at the beginning of the encounter.  Current Medications:  Current Outpatient Medications:    nystatin-triamcinolone ointment (MYCOLOG), Apply 1 application. topically 2 (two) times daily., Disp: 30 g, Rfl: 0   acetaminophen (TYLENOL) 500 MG tablet, Take 2 tablets (1,000 mg total) by mouth every 8 (eight) hours as needed (pain)., Disp: 60 tablet, Rfl: 0   albuterol (VENTOLIN HFA) 108 (90 Base) MCG/ACT inhaler, Inhale 2 puffs into the lungs every 6 (six) hours as needed for wheezing or shortness of breath., Disp: 8 g, Rfl: 2   ALPRAZolam (XANAX) 0.5 MG tablet, Take 0.5 mg by mouth daily as needed., Disp: , Rfl:    ARIPiprazole (ABILIFY IM), Inject 300 mg into the muscle every 30 (thirty) days. (Patient not taking: Reported on 04/09/2022), Disp: , Rfl:    Blood Pressure Monitoring (BLOOD PRESSURE KIT) DEVI, 1 Device by Does not apply route once a week. (Patient not taking: Reported on 02/25/2022), Disp: 1 each, Rfl: 0   divalproex (DEPAKOTE ER) 500 MG 24 hr tablet, Take 500 mg by mouth at bedtime., Disp: , Rfl:    docusate sodium (COLACE) 100 MG capsule, Take 1 capsule (100 mg total) by mouth 2 (two) times daily., Disp: 30 capsule, Rfl: 0   ibuprofen (ADVIL) 800 MG tablet, Take 1 tablet (800 mg total) by mouth 3 (three) times daily with meals as needed for headache, moderate pain or cramping., Disp: 30 tablet, Rfl: 1   ipratropium (ATROVENT) 0.03 % nasal spray, Place 2 sprays into both nostrils every 12 (twelve) hours. (Patient not taking: Reported on 04/09/2022), Disp: 30 mL, Rfl: 0   oxyCODONE (ROXICODONE) 5 MG immediate release tablet,  Take 1 tablet (5 mg total) by mouth every 6 (six) hours as needed for severe pain. (Patient not taking: Reported on 04/09/2022), Disp: 5 tablet, Rfl: 0   promethazine-dextromethorphan (PROMETHAZINE-DM) 6.25-15 MG/5ML syrup, Take 5 mLs by mouth 4 (four) times daily as needed for cough. (Patient not taking: Reported on 04/09/2022), Disp: 118 mL, Rfl: 0   VRAYLAR 1.5 MG capsule, Take 1.5 mg by mouth daily., Disp: , Rfl:    Medications ordered in this encounter:  Meds ordered this encounter  Medications   nystatin-triamcinolone ointment (MYCOLOG)    Sig: Apply 1 application. topically 2 (two) times daily.    Dispense:  30 g    Refill:  0    Order Specific Question:   Supervising Provider    Answer:   Sabra Heck, [place]     *If you need refills on other medications prior to your next appointment, please contact your pharmacy*  Follow-Up: Call back or seek an in-person evaluation if the symptoms worsen or if the condition fails to improve as anticipated.  Other Instructions Rash, Adult A rash is a change in the color of your skin. A rash can also change the way your skin feels. There are many different conditions and factors that can cause a rash. Some rashes may disappear after a few days, but some may last for a few weeks. Common causes of rashes include: Viral infections, such as: Colds. Measles. Hand, foot, and mouth disease. Bacterial infections, such as: Scarlet  fever. Impetigo. Fungal infections, such as Candida. Allergic reactions to food, medicines, or skin care products. Follow these instructions at home: The goal of treatment is to stop the itching and keep the rash from spreading. Pay attention to any changes in your symptoms. Follow these instructions to help with your condition: Medicine Take or apply over-the-counter and prescription medicines only as told by your health care provider. These may include: Corticosteroid creams to treat red or swollen skin. Anti-itch  lotions. Oral allergy medicines (antihistamines). Oral corticosteroids for severe symptoms.  Skin care Apply cool compresses to the affected areas. Do not scratch or rub your skin. Avoid covering the rash. Make sure the rash is exposed to air as much as possible. Managing itching and discomfort Avoid hot showers or baths, which can make itching worse. A cold shower may help. Try taking a bath with: Epsom salts. Follow manufacturer instructions on the packaging. You can get these at your local pharmacy or grocery store. Baking soda. Pour a small amount into the bath as told by your health care provider. Colloidal oatmeal. Follow manufacturer instructions on the packaging. You can get this at your local pharmacy or grocery store. Try applying baking soda paste to your skin. Stir water into baking soda until it reaches a paste-like consistency. Try applying calamine lotion. This is an over-the-counter lotion that helps to relieve itchiness. Keep cool and out of the sun. Sweating and being hot can make itching worse. General instructions  Rest as needed. Drink enough fluid to keep your urine pale yellow. Wear loose-fitting clothing. Avoid scented soaps, detergents, and perfumes. Use gentle soaps, detergents, perfumes, and other cosmetic products. Avoid any substance that causes your rash. Keep a journal to help track what causes your rash. Write down: What you eat. What cosmetic products you use. What you drink. What you wear. This includes jewelry. Keep all follow-up visits as told by your health care provider. This is important. Contact a health care provider if: You sweat at night. You lose weight. You urinate more than normal. You urinate less than normal, or you notice that your urine is a darker color than usual. You feel weak. You vomit. Your skin or the whites of your eyes look yellow (jaundice). Your skin: Tingles. Is numb. Your rash: Does not go away after several  days. Gets worse. You are: Unusually thirsty. More tired than normal. You have: New symptoms. Pain in your abdomen. A fever. Diarrhea. Get help right away if you: Have a fever and your symptoms suddenly get worse. Develop confusion. Have a severe headache or a stiff neck. Have severe joint pains or stiffness. Have a seizure. Develop a rash that covers all or most of your body. The rash may or may not be painful. Develop blisters that: Are on top of the rash. Grow larger or grow together. Are painful. Are inside your nose or mouth. Develop a rash that: Looks like purple pinprick-sized spots all over your body. Has a "bull's eye" or looks like a target. Is not related to sun exposure, is red and painful, and causes your skin to peel. Summary A rash is a change in the color of your skin. Some rashes disappear after a few days, but some may last for a few weeks. The goal of treatment is to stop the itching and keep the rash from spreading. Take or apply over-the-counter and prescription medicines only as told by your health care provider. Contact a health care provider if you have new or  worsening symptoms. Keep all follow-up visits as told by your health care provider. This is important. This information is not intended to replace advice given to you by your health care provider. Make sure you discuss any questions you have with your health care provider. Document Revised: 09/12/2021 Document Reviewed: 09/12/2021 Elsevier Patient Education  [place].    If you have been instructed to have an in-person evaluation today at a local Urgent Care facility, please use the link below. It will take you to a list of all of our available [facility], including address, phone number and hours of operation. Please do not delay care.  [facility]  If you or a family member do not have a primary care provider, use the link below to schedule a visit and  establish care. When you choose a [facility] primary care physician or advanced practice provider, you gain a long-term partner in health. Find a Primary Care Provider  Learn more about 's in-office and virtual care options: [facility]

## 2022-05-04 NOTE — Progress Notes (Signed)
Virtual Visit Consent   [name], you are scheduled for a virtual visit with a [facility] provider today. Just as with appointments in the office, your consent must be obtained to participate. Your consent will be active for this visit and any virtual visit you may have with one of our providers in the next 365 days. If you have a MyChart account, a copy of this consent can be sent to you electronically.  As this is a virtual visit, video technology does not allow for your provider to perform a traditional examination. This may limit your provider's ability to fully assess your condition. If your provider identifies any concerns that need to be evaluated in person or the need to arrange testing (such as labs, EKG, etc.), we will make arrangements to do so. Although advances in technology are sophisticated, we cannot ensure that it will always work on either your end or our end. If the connection with a video visit is poor, the visit may have to be switched to a telephone visit. With either a video or telephone visit, we are not always able to ensure that we have a secure connection.  By engaging in this virtual visit, you consent to the provision of healthcare and authorize for your insurance to be billed (if applicable) for the services provided during this visit. Depending on your insurance coverage, you may receive a charge related to this service.  I need to obtain your verbal consent now. Are you willing to proceed with your visit today? Raynee Mccasland [employer] has provided verbal consent on 05/04/2022 for a virtual visit (video or telephone). Kennieth Rad, PA-C  Date: 05/04/2022 7:17 PM  Virtual Visit via Video Note   I, [place], connected with  [name]  ([id number], Aug 21, 1986) on 05/04/22 at  6:30 PM EDT by a video-enabled telemedicine application and verified that I am speaking with the correct person using two identifiers.  Location: Patient: Virtual Visit Location  Patient: Home Provider: Virtual Visit Location Provider: Home Office   I discussed the limitations of evaluation and management by telemedicine and the availability of in person appointments. The patient expressed understanding and agreed to proceed.    History of Present Illness: [name] is a 36 y.o. who identifies as a female who was assigned female at birth, and is being seen today for a burning itchy rash under both breasts.  States that this started 2 days ago.  Denies any new detergents, lotions, body washes, medications, fragrances.  No one at home with same rash.  States that she has had yeast infection rashes in the past in this area.  Has been using cornstarch without relief.    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     Methylphenidate Derivatives Other (See Comments)    Reaction:  Depression and anger    Neurontin [Gabapentin] Other (See Comments)    Reaction:  Dizziness    Prozac [Fluoxetine Hcl] Other (See Comments)    Reaction:  Anger    Methylphenidate Hcl     SAME AS PREVIOUS LISTED METHYLPHENIDATE    Medications:  Current Outpatient Medications:    nystatin-triamcinolone ointment (MYCOLOG), Apply 1 application. topically 2 (two) times daily., Disp: 30 g, Rfl: 0   acetaminophen (TYLENOL) 500 MG tablet, Take 2 tablets (1,000 mg total) by mouth every 8 (eight) hours as needed (pain)., Disp: 60 tablet, Rfl: 0   albuterol (VENTOLIN HFA) 108 (90 Base) MCG/ACT inhaler, Inhale 2 puffs into the lungs every 6 (six) hours as needed for wheezing or shortness of breath., Disp: 8 g, Rfl: 2   ALPRAZolam (XANAX) 0.5 MG tablet, Take 0.5 mg by mouth daily as needed., Disp: , Rfl:    ARIPiprazole (ABILIFY IM), Inject 300 mg into the muscle every 30 (thirty) days. (Patient not taking: Reported on 04/09/2022), Disp: , Rfl:    Blood Pressure Monitoring (BLOOD PRESSURE KIT) DEVI, 1 Device by Does not apply route once a week. (Patient not taking: Reported on 02/25/2022), Disp: 1 each, Rfl: 0   divalproex (DEPAKOTE ER) 500 MG 24 hr tablet, Take 500 mg by mouth at bedtime., Disp: , Rfl:    docusate sodium (COLACE) 100 MG capsule, Take 1 capsule (100 mg total) by mouth 2 (two) times daily., Disp: 30 capsule, Rfl: 0   ibuprofen (ADVIL) 800 MG tablet, Take 1 tablet (800 mg total) by mouth 3 (three) times daily with meals as needed for headache, moderate pain or cramping., Disp: 30 tablet, Rfl: 1   ipratropium (ATROVENT) 0.03 % nasal spray, Place 2 sprays into both nostrils every 12 (twelve) hours. (Patient not taking: Reported on 04/09/2022), Disp: 30 mL, Rfl: 0   oxyCODONE (ROXICODONE) 5 MG immediate release tablet, Take 1 tablet (5 mg total) by mouth every 6 (six) hours as needed for severe pain. (Patient not taking: Reported on 04/09/2022), Disp: 5 tablet, Rfl: 0   promethazine-dextromethorphan (PROMETHAZINE-DM) 6.25-15 MG/5ML syrup, Take 5 mLs by mouth 4 (four) times daily as needed for cough. (Patient not taking: Reported on 04/09/2022), Disp: 118 mL, Rfl: 0   VRAYLAR 1.5 MG capsule, Take 1.5 mg by mouth daily., Disp: ,  Rfl:   Observations/Objective: Patient is well-developed, well-nourished in no acute distress.  Resting comfortably  at home.  Head is normocephalic, atraumatic.  No labored breathing.  Speech is clear and coherent with logical content.  Patient is alert and oriented at baseline.    Assessment and Plan: 1. Dermatitis - nystatin-triamcinolone ointment (MYCOLOG); Apply 1 application. topically 2 (two) times daily.  Dispense: 30 g; Refill: 0  Patient education given on supportive care, red flags for prompt reevaluation  Follow Up Instructions: I discussed the assessment and treatment plan with the patient. The patient was provided an opportunity to ask questions and all were answered. The patient agreed with the plan and demonstrated an understanding of the instructions.  A copy of instructions were sent to the patient via MyChart unless otherwise noted below.     The patient was advised to call back or seek an in-person evaluation if the symptoms worsen or if the condition fails to improve as anticipated.  Time:  I spent  12 minutes with the patient via telehealth technology discussing the above problems/concerns.    Loraine Grip Mayers, PA-C

## 2022-05-04 NOTE — Progress Notes (Signed)
Patient did not keep her post op appointment for 05/01/2022.  Durene Romans MD Attending Center for [employer] [facility])

## 2022-05-07 ENCOUNTER — Telehealth: Payer: Self-pay | Admitting: Physical Medicine and Rehabilitation

## 2022-05-07 NOTE — Telephone Encounter (Signed)
Pt called requesting a call back to reschedule appt. Please call pt at [phone]

## 2022-05-08 ENCOUNTER — Telehealth (INDEPENDENT_AMBULATORY_CARE_PROVIDER_SITE_OTHER): Payer: 59 | Admitting: Obstetrics and Gynecology

## 2022-05-08 ENCOUNTER — Encounter: Payer: Self-pay | Admitting: Obstetrics and Gynecology

## 2022-05-08 VITALS — Ht 63.0 in

## 2022-05-08 DIAGNOSIS — R109 Unspecified abdominal pain: Secondary | ICD-10-CM

## 2022-05-08 DIAGNOSIS — G8918 Other acute postprocedural pain: Secondary | ICD-10-CM

## 2022-05-08 NOTE — Progress Notes (Signed)
GYNECOLOGY VIRTUAL VISIT ENCOUNTER NOTE  Provider location: [facility]   Patient location: Home  I connected with [name] on 05/08/22 at  2:35 PM EDT by MyChart Video Encounter and verified that I am speaking with the correct person using two identifiers.   I discussed the limitations, risks, security and privacy concerns of performing an evaluation and management service virtually and the availability of in person appointments. I also discussed with the patient that there may be a patient responsible charge related to this service. The patient expressed understanding and agreed to proceed.   History:  CHAE [name] is a 36 y.o. [id number] female being evaluated today for follow up of abdominal pain after laparoscopic salpingectomy. Pt notes she no longer has any abdominal pain and admits she missed her CT scan as well as her postoperative visit.  She notes no issues with her bowels or ability to eat.      Past Medical History:  Diagnosis Date   Abnormal antenatal ultrasound 10/09/2021   Echogenic bowel of infant.    Anxiety    Asthma    inhaler PRN-hasn't used inhaler in months   Attention deficit hyperactivity disorder    Bipolar disorder [facility])    hospitalized as a teen for suicidal ideations and cutting   COVID-19 affecting puerperium 12/19/2021   Depression    no complaints, doing well   Gastritis    GERD (gastroesophageal reflux disease)    History of gestational diabetes in prior pregnancy, currently pregnant 10/13/2017   History of gestational hypertension 07/03/2021   History of pre-eclampsia in prior pregnancy, currently pregnant 07/03/2021   History of preterm delivery after IOL for preeclampsia, currently pregnant 10/13/2017   Not a 17P candidate   Hx of varicella    Mild preeclampsia 02/09/2016   Nausea and vomiting during pregnancy 10/04/2019   Vaginal Pap smear, abnormal    Past Surgical History:  Procedure  Laterality Date   DILATION AND EVACUATION N/A 01/08/2021   Procedure: DILATATION AND EVACUATION;  Surgeon: Chancy Milroy, MD;  Location: [facility];  Service: Gynecology;  Laterality: N/A;   EYE MUSCLE SURGERY Bilateral    07/29/12   LAPAROSCOPIC TUBAL LIGATION N/A 04/01/2022   Procedure: LAPAROSCOPIC TUBAL LIGATION;  Surgeon: Aletha Halim, MD;  Location: [facility];  Service: Gynecology;  Laterality: N/A;   MOUTH SURGERY     PLANTAR FASCIA SURGERY     WISDOM TOOTH EXTRACTION     The following portions of the patient's history were reviewed and updated as appropriate: allergies, current medications, past family history, past medical history, past social history, past surgical history and problem list.   Health Maintenance:  Normal pap and negative HRHPV on 8/21.    Review of Systems:  Pertinent items noted in HPI and remainder of comprehensive ROS otherwise negative.  Physical Exam:   General:  Alert, oriented and cooperative. Patient appears to be in no acute distress.  Mental Status: Normal mood and affect. Normal behavior. Normal judgment and thought content.   Respiratory: Normal respiratory effort, no problems with respiration noted  Rest of physical exam deferred due to type of encounter  Labs and Imaging No results found. However, due to the size of the patient record, not all encounters were searched. Please check Results Review for a complete set of results. No results found.     Assessment and Plan:     Postoperative abdominal pain, resolved  I discussed the assessment and treatment plan with the patient. The patient was provided an opportunity to ask questions and all were answered. The patient agreed with the plan and demonstrated an understanding of the instructions.   The patient was advised to call back or seek an in-person evaluation/go to the ED if the symptoms worsen or if the condition fails to improve as anticipated.  I  provided 10 minutes of face-to-face time during this encounter.   Griffin Basil, MD Center for [facility], [place]

## 2022-05-09 ENCOUNTER — Encounter: Payer: 59 | Admitting: Physical Medicine and Rehabilitation

## 2022-06-02 ENCOUNTER — Telehealth: Payer: 59 | Admitting: Physician Assistant

## 2022-06-02 DIAGNOSIS — L304 Erythema intertrigo: Secondary | ICD-10-CM | POA: Diagnosis not present

## 2022-06-02 DIAGNOSIS — R110 Nausea: Secondary | ICD-10-CM

## 2022-06-02 DIAGNOSIS — K130 Diseases of lips: Secondary | ICD-10-CM

## 2022-06-02 DIAGNOSIS — K219 Gastro-esophageal reflux disease without esophagitis: Secondary | ICD-10-CM | POA: Diagnosis not present

## 2022-06-02 MED ORDER — ONDANSETRON HCL 4 MG PO TABS: 4.0000 mg | ORAL_TABLET | Freq: Three times a day (TID) | ORAL | 0 refills | Status: DC | PRN

## 2022-06-02 MED ORDER — MUPIROCIN 2 % EX OINT: 1.0000 | TOPICAL_OINTMENT | Freq: Two times a day (BID) | CUTANEOUS | 0 refills | Status: DC

## 2022-06-02 MED ORDER — NYSTATIN 100000 UNIT/GM EX POWD: 1.0000 | Freq: Three times a day (TID) | CUTANEOUS | 0 refills | Status: DC

## 2022-06-02 MED ORDER — PANTOPRAZOLE SODIUM 40 MG PO TBEC: 40.0000 mg | DELAYED_RELEASE_TABLET | Freq: Every day | ORAL | 0 refills | Status: DC

## 2022-06-02 NOTE — Patient Instructions (Signed)
[name], thank you for joining Mar Daring, PA-C for today's virtual visit.  While this provider is not your primary care provider (PCP), if your PCP is located in our provider database this encounter information will be shared with them immediately following your visit.  Consent: (Patient) [name] provided verbal consent for this virtual visit at the beginning of the encounter.  Current Medications:  Current Outpatient Medications:    mupirocin ointment (BACTROBAN) 2 %, Apply 1 Application topically 2 (two) times daily., Disp: 22 g, Rfl: 0   nystatin (MYCOSTATIN/NYSTOP) powder, Apply 1 Application topically 3 (three) times daily., Disp: 60 g, Rfl: 0   ondansetron (ZOFRAN) 4 MG tablet, Take 1 tablet (4 mg total) by mouth every 8 (eight) hours as needed for nausea or vomiting., Disp: 20 tablet, Rfl: 0   pantoprazole (PROTONIX) 40 MG tablet, Take 1 tablet (40 mg total) by mouth daily., Disp: 90 tablet, Rfl: 0   acetaminophen (TYLENOL) 500 MG tablet, Take 2 tablets (1,000 mg total) by mouth every 8 (eight) hours as needed (pain)., Disp: 60 tablet, Rfl: 0   albuterol (VENTOLIN HFA) 108 (90 Base) MCG/ACT inhaler, Inhale 2 puffs into the lungs every 6 (six) hours as needed for wheezing or shortness of breath., Disp: 8 g, Rfl: 2   ALPRAZolam (XANAX) 0.5 MG tablet, Take 0.5 mg by mouth daily as needed., Disp: , Rfl:    ARIPiprazole (ABILIFY IM), Inject 300 mg into the muscle every 30 (thirty) days. (Patient not taking: Reported on 04/09/2022), Disp: , Rfl:    Blood Pressure Monitoring (BLOOD PRESSURE KIT) DEVI, 1 Device by Does not apply route once a week. (Patient not taking: Reported on 02/25/2022), Disp: 1 each, Rfl: 0   divalproex (DEPAKOTE ER) 500 MG 24 hr tablet, Take 500 mg by mouth at bedtime., Disp: , Rfl:    docusate sodium (COLACE) 100 MG capsule, Take 1 capsule (100 mg total) by mouth 2 (two) times daily., Disp: 30 capsule, Rfl: 0   ibuprofen (ADVIL) 800 MG  tablet, Take 1 tablet (800 mg total) by mouth 3 (three) times daily with meals as needed for headache, moderate pain or cramping., Disp: 30 tablet, Rfl: 1   nystatin-triamcinolone ointment (MYCOLOG), Apply 1 application. topically 2 (two) times daily., Disp: 30 g, Rfl: 0   VRAYLAR 1.5 MG capsule, Take 1.5 mg by mouth daily., Disp: , Rfl:    Medications ordered in this encounter:  Meds ordered this encounter  Medications   nystatin (MYCOSTATIN/NYSTOP) powder    Sig: Apply 1 Application topically 3 (three) times daily.    Dispense:  60 g    Refill:  0    Order Specific Question:   Supervising Provider    Answer:   MILLER, BRIAN [[id number]]   pantoprazole (PROTONIX) 40 MG tablet    Sig: Take 1 tablet (40 mg total) by mouth daily.    Dispense:  90 tablet    Refill:  0    Order Specific Question:   Supervising Provider    Answer:   MILLER, BRIAN [[id number]]   ondansetron (ZOFRAN) 4 MG tablet    Sig: Take 1 tablet (4 mg total) by mouth every 8 (eight) hours as needed for nausea or vomiting.    Dispense:  20 tablet    Refill:  0    Order Specific Question:   Supervising Provider    Answer:   Sabra Heck, BRIAN [[id number]]   mupirocin ointment (BACTROBAN) 2 %    Sig: Apply  1 Application topically 2 (two) times daily.    Dispense:  22 g    Refill:  0    Order Specific Question:   Supervising Provider    Answer:   Sabra Heck, [place]     *If you need refills on other medications prior to your next appointment, please contact your pharmacy*  Follow-Up: Call back or seek an in-person evaluation if the symptoms worsen or if the condition fails to improve as anticipated.  Other Instructions Food Choices for Gastroesophageal Reflux Disease, Adult When you have gastroesophageal reflux disease (GERD), the foods you eat and your eating habits are very important. Choosing the right foods can help ease your discomfort. Think about working with a food expert (dietitian) to help you make good choices. What are tips  for following this plan? Reading food labels Look for foods that are low in saturated fat. Foods that may help with your symptoms include: Foods that have less than 5% of daily value (DV) of fat. Foods that have 0 grams of trans fat. Cooking Do not fry your food. Cook your food by baking, steaming, grilling, or broiling. These are all methods that do not need a lot of fat for cooking. To add flavor, try to use herbs that are low in spice and acidity. Meal planning  Choose healthy foods that are low in fat, such as: Fruits and vegetables. Whole grains. Low-fat dairy products. Lean meats, fish, and poultry. Eat small meals often instead of eating 3 large meals each day. Eat your meals slowly in a place where you are relaxed. Avoid bending over or lying down until 2-3 hours after eating. Limit high-fat foods such as fatty meats or fried foods. Limit your intake of fatty foods, such as oils, butter, and shortening. Avoid the following as told by your doctor: Foods that cause symptoms. These may be different for different people. Keep a food diary to keep track of foods that cause symptoms. Alcohol. Drinking a lot of liquid with meals. Eating meals during the 2-3 hours before bed. Lifestyle Stay at a healthy weight. Ask your doctor what weight is healthy for you. If you need to lose weight, work with your doctor to do so safely. Exercise for at least 30 minutes on 5 or more days each week, or as told by your doctor. Wear loose-fitting clothes. Do not smoke or use any products that contain nicotine or tobacco. If you need help quitting, ask your doctor. Sleep with the head of your bed higher than your feet. Use a wedge under the mattress or blocks under the bed frame to raise the head of the bed. Chew sugar-free gum after meals. What foods should eat?  Eat a healthy, well-balanced diet of fruits, vegetables, whole grains, low-fat dairy products, lean meats, fish, and poultry. Each person  is different. Foods that may cause symptoms in one person may not cause any symptoms in another person. Work with your doctor to find foods that are safe for you. The items listed above may not be a complete list of what you can eat and drink. Contact a food expert for more options. What foods should I avoid? Limiting some of these foods may help in managing the symptoms of GERD. Everyone is different. Talk with a food expert or your doctor to help you find the exact foods to avoid, if any. Fruits Any fruits prepared with added fat. Any fruits that cause symptoms. For some people, this may include citrus fruits, such as  oranges, grapefruit, pineapple, and lemons. Vegetables Deep-fried vegetables. [place] fries. Any vegetables prepared with added fat. Any vegetables that cause symptoms. For some people, this may include tomatoes and tomato products, chili peppers, onions and garlic, and horseradish. Grains Pastries or quick breads with added fat. Meats and other proteins High-fat meats, such as fatty beef or pork, hot dogs, ribs, ham, sausage, salami, and bacon. Fried meat or protein, including fried fish and fried chicken. Nuts and nut butters, in large amounts. Dairy Whole milk and chocolate milk. Sour cream. Cream. Ice cream. Cream cheese. Milkshakes. Fats and oils Butter. Margarine. Shortening. Ghee. Beverages Coffee and tea, with or without caffeine. Carbonated beverages. Sodas. Energy drinks. Fruit juice made with acidic fruits, such as orange or grapefruit. Tomato juice. Alcoholic drinks. Sweets and desserts Chocolate and cocoa. Donuts. Seasonings and condiments Pepper. Peppermint and spearmint. Added salt. Any condiments, herbs, or seasonings that cause symptoms. For some people, this may include curry, hot sauce, or vinegar-based salad dressings. The items listed above may not be a complete list of what you should not eat and drink. Contact a food expert for more options. Questions to  ask your doctor Diet and lifestyle changes are often the first steps that are taken to manage symptoms of GERD. If diet and lifestyle changes do not help, talk with your doctor about taking medicines. Where to find more information International Foundation for Gastrointestinal Disorders: aboutgerd.org Summary When you have GERD, food and lifestyle choices are very important in easing your symptoms. Eat small meals often instead of 3 large meals a day. Eat your meals slowly and in a place where you are relaxed. Avoid bending over or lying down until 2-3 hours after eating. Limit high-fat foods such as fatty meats or fried foods. This information is not intended to replace advice given to you by your health care provider. Make sure you discuss any questions you have with your health care provider. Document Revised: 06/11/2020 Document Reviewed: 06/11/2020 Elsevier Patient Education  [place] is skin irritation (inflammation) that happens in warm, moist areas of the body. The irritation can cause a rash and make skin raw and itchy. The rash is usually pink or red. It happens mostly between folds of skin or where skin rubs together, such as: Between the toes. In the armpits. In the groin area. Under the belly. Under the breasts. Around the butt area. This condition is not passed from person to person (is not contagious). What are the causes? Heat, moisture, rubbing, and not enough air movement. The condition can be made worse by: Sweat. Bacteria. A fungus, such as yeast. What increases the risk? Moisture in your skin folds. You are more likely to develop this condition if you: Have diabetes. Are overweight. Are not able to move around. Live in a warm and moist climate. Wear splints, braces, or other medical devices. Are not able to control your pee (urine) or poop (stool). What are the signs or symptoms? A pink or red skin rash in the skin fold or  near the skin fold. Raw or scaly skin. Itching. A burning feeling. Bleeding. Leaking fluid. A bad smell. How is this treated? Cleaning and drying your skin. Taking an antibiotic medicine or using an antibiotic skin cream for a bacterial infection. Using an antifungal cream on your skin or taking pills for an infection that was caused by a fungus, such as yeast. Using a steroid ointment to stop the itching and irritation. Separating the  skin fold with a clean cotton cloth to absorb moisture and allow air to flow into the area. Follow these instructions at home: Keep the affected area clean and dry. Do not scratch your skin. Stay cool as much as you can. Use an air conditioner or a fan, if you have one. Apply over-the-counter and prescription medicines only as told by your doctor. If you were prescribed an antibiotic medicine, use it as told by your doctor. Do not stop using the antibiotic even if your condition starts to get better. Keep all follow-up visits as told by your doctor. This is important. How is this prevented?  Stay at a healthy weight. Take care of your feet. This is very important if you have diabetes. You should: Wear shoes that fit well. Keep your feet dry. Wear clean cotton or wool socks. Protect the skin in your groin and butt area as told by your doctor. To do this: Follow a regular cleaning routine. Use creams, powders, or ointments that protect your skin. Change protection pads often. Do not wear tight clothes. Wear clothes that: Are loose. Take moisture away from your body. Are made of cotton. Wear a bra that gives good support, if needed. Shower and dry yourself well after being active. Use a hair dryer on a cool setting to dry between skin folds. Keep your blood sugar under control if you have diabetes. Contact a doctor if: Your symptoms do not get better with treatment. Your symptoms get worse or they spread. You notice more redness and warmth. You  have a fever. Summary Intertrigo is skin irritation that occurs when folds of skin rub together. This condition is caused by heat, moisture, and rubbing. This condition may be treated by cleaning and drying your skin and with medicines. Apply over-the-counter and prescription medicines only as told by your doctor. Keep all follow-up visits as told by your doctor. This is important. This information is not intended to replace advice given to you by your health care provider. Make sure you discuss any questions you have with your health care provider. Document Revised: 09/16/2021 Document Reviewed: 09/16/2021 Elsevier Patient Education  [place].    If you have been instructed to have an in-person evaluation today at a local Urgent Care facility, please use the link below. It will take you to a list of all of our available [facility], including address, phone number and hours of operation. Please do not delay care.  [facility]  If you or a family member do not have a primary care provider, use the link below to schedule a visit and establish care. When you choose a [facility] primary care physician or advanced practice provider, you gain a long-term partner in health. Find a Primary Care Provider  Learn more about [facility]'s in-office and virtual care options: [facility]

## 2022-06-02 NOTE — Progress Notes (Signed)
Virtual Visit Consent   [name], you are scheduled for a virtual visit with a [facility] provider today. Just as with appointments in the office, your consent must be obtained to participate. Your consent will be active for this visit and any virtual visit you may have with one of our providers in the next 365 days. If you have a MyChart account, a copy of this consent can be sent to you electronically.  As this is a virtual visit, video technology does not allow for your provider to perform a traditional examination. This may limit your provider's ability to fully assess your condition. If your provider identifies any concerns that need to be evaluated in person or the need to arrange testing (such as labs, EKG, etc.), we will make arrangements to do so. Although advances in technology are sophisticated, we cannot ensure that it will always work on either your end or our end. If the connection with a video visit is poor, the visit may have to be switched to a telephone visit. With either a video or telephone visit, we are not always able to ensure that we have a secure connection.  By engaging in this virtual visit, you consent to the provision of healthcare and authorize for your insurance to be billed (if applicable) for the services provided during this visit. Depending on your insurance coverage, you may receive a charge related to this service.  I need to obtain your verbal consent now. Are you willing to proceed with your visit today? [name] has provided verbal consent on 06/02/2022 for a virtual visit (video or telephone). Mar Daring, PA-C  Date: 06/02/2022 6:13 PM  Virtual Visit via Video Note   I, Mar Daring, connected with  [name]  ([id number], [birth year]) on 06/02/22 at  6:00 PM EDT by a video-enabled telemedicine application and verified that I am speaking with the correct person using two identifiers.  Location: Patient: Virtual Visit  Location Patient: Home Provider: Virtual Visit Location Provider: Home Office   I discussed the limitations of evaluation and management by telemedicine and the availability of in person appointments. The patient expressed understanding and agreed to proceed.    History of Present Illness: [name] is a 36 y.o. who identifies as a female who was assigned female at birth, and is being seen today for nausea. Having nausea in the morning. Has been ongoing for last three days. Does have a history of GERD and feels that has been more active as well.   Has a rash under her breast tissue that is red. It burns and itches as well. Was treated with mycolog cream at the end of May. Reports it improves while using cream but returns when she stops using.   Also, has a crack at the corner of her mouth on the right side. Reports it scabs over, then cracks open again.    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     Methylphenidate Derivatives Other (See Comments)    Reaction:  Depression and anger    Neurontin [Gabapentin] Other (See Comments)     Reaction:  Dizziness    Prozac [Fluoxetine Hcl] Other (See Comments)    Reaction:  Anger    Methylphenidate Hcl     SAME AS PREVIOUS LISTED METHYLPHENIDATE   Medications:  Current Outpatient Medications:    mupirocin ointment (BACTROBAN) 2 %, Apply 1 Application topically 2 (two) times daily., Disp: 22 g, Rfl: 0   nystatin (MYCOSTATIN/NYSTOP) powder, Apply 1 Application topically 3 (three) times daily., Disp: 60 g, Rfl: 0   ondansetron (ZOFRAN) 4 MG tablet, Take 1 tablet (4 mg total) by mouth every 8 (eight) hours as needed for nausea or vomiting., Disp: 20 tablet, Rfl: 0   pantoprazole (PROTONIX) 40 MG tablet, Take 1 tablet (40 mg total) by mouth daily., Disp: 90 tablet, Rfl: 0   acetaminophen (TYLENOL) 500 MG tablet, Take 2 tablets (1,000 mg total) by mouth every 8 (eight) hours as needed (pain)., Disp: 60 tablet, Rfl: 0   albuterol (VENTOLIN HFA) 108 (90 Base) MCG/ACT inhaler, Inhale 2 puffs into the lungs every 6 (six) hours as needed for wheezing or shortness of breath., Disp: 8 g, Rfl: 2   ALPRAZolam (XANAX) 0.5 MG tablet, Take 0.5 mg by mouth daily as needed., Disp: , Rfl:    ARIPiprazole (ABILIFY IM), Inject 300 mg into the muscle every 30 (thirty) days. (Patient not taking: Reported on 04/09/2022), Disp: , Rfl:    Blood Pressure Monitoring (BLOOD PRESSURE KIT) DEVI, 1 Device by Does not apply route once a week. (Patient not taking: Reported on 02/25/2022), Disp: 1 each, Rfl: 0   divalproex (DEPAKOTE ER) 500 MG 24 hr tablet, Take 500 mg by mouth at bedtime., Disp: , Rfl:    docusate sodium (COLACE) 100 MG capsule, Take 1 capsule (100 mg total) by mouth 2 (two) times daily., Disp: 30 capsule, Rfl: 0   ibuprofen (ADVIL) 800 MG tablet, Take 1 tablet (800 mg total) by mouth 3 (three) times daily with meals as needed for headache, moderate pain or cramping., Disp: 30 tablet, Rfl: 1   nystatin-triamcinolone ointment (MYCOLOG), Apply 1 application. topically 2 (two) times daily., Disp: 30 g, Rfl:  0   VRAYLAR 1.5 MG capsule, Take 1.5 mg by mouth daily., Disp: , Rfl:   Observations/Objective: Patient is well-developed, well-nourished in no acute distress.  Resting comfortably at home.  Head is normocephalic, atraumatic.  No labored breathing.  Speech is clear and coherent with logical content.  Patient is alert and oriented at baseline.  Well demarcated, erythematous rash noted under breast tissue Dry, cracked skin in right corner of mouth  Assessment and Plan: 1. Nausea - pantoprazole (PROTONIX) 40 MG tablet; Take 1 tablet (40 mg total) by mouth daily.  Dispense: 90 tablet; Refill: 0 - ondansetron (ZOFRAN) 4 MG tablet; Take 1 tablet (4 mg total) by mouth every 8 (eight) hours as needed for nausea or vomiting.  Dispense: 20 tablet; Refill: 0  2. Intertrigo - nystatin (MYCOSTATIN/NYSTOP) powder; Apply 1 Application topically 3 (three) times daily.  Dispense: 60 g; Refill: 0  3. Gastroesophageal reflux disease without esophagitis -  pantoprazole (PROTONIX) 40 MG tablet; Take 1 tablet (40 mg total) by mouth daily.  Dispense: 90 tablet; Refill: 0  4. Angular cheilitis - mupirocin ointment (BACTROBAN) 2 %; Apply 1 Application topically 2 (two) times daily.  Dispense: 22 g; Refill: 0  - Restart pantoprazole for GERD and nausea; food choices sent via AVS - Zofran for breakthrough nausea - Nystatin powder for intertrigo - Mupirocin ointment for angular cheilitis - Seek in person evaluation if worsening  Follow Up Instructions: I discussed the assessment and treatment plan with the patient. The patient was provided an opportunity to ask questions and all were answered. The patient agreed with the plan and demonstrated an understanding of the instructions.  A copy of instructions were sent to the patient via MyChart unless otherwise noted below.   The patient was advised to call back or seek an in-person evaluation if the symptoms worsen or if the condition fails to improve as  anticipated.  Time:  I spent 20 minutes with the patient via telehealth technology discussing the above problems/concerns.    Mar Daring, PA-C

## 2022-06-04 ENCOUNTER — Encounter: Payer: 59 | Admitting: Physical Medicine and Rehabilitation

## 2022-06-04 ENCOUNTER — Telehealth: Payer: Self-pay | Admitting: Physical Medicine and Rehabilitation

## 2022-06-04 NOTE — Telephone Encounter (Signed)
Pt called requesting to reschedule appt. Please call pt at [phone].

## 2022-06-10 ENCOUNTER — Encounter: Payer: Self-pay | Admitting: Obstetrics and Gynecology

## 2022-06-10 DIAGNOSIS — F1120 Opioid dependence, uncomplicated: Secondary | ICD-10-CM | POA: Insufficient documentation

## 2022-06-11 ENCOUNTER — Telehealth: Payer: 59 | Admitting: Physician Assistant

## 2022-06-11 DIAGNOSIS — G43809 Other migraine, not intractable, without status migrainosus: Secondary | ICD-10-CM

## 2022-06-11 DIAGNOSIS — L304 Erythema intertrigo: Secondary | ICD-10-CM

## 2022-06-11 MED ORDER — FLUCONAZOLE 150 MG PO TABS: 150.0000 mg | ORAL_TABLET | ORAL | 0 refills | Status: DC

## 2022-06-11 MED ORDER — NYSTATIN 100000 UNIT/GM EX CREA: 1.0000 | TOPICAL_CREAM | Freq: Two times a day (BID) | CUTANEOUS | 0 refills | Status: DC

## 2022-06-11 MED ORDER — IBUPROFEN 800 MG PO TABS: 800.0000 mg | ORAL_TABLET | Freq: Three times a day (TID) | ORAL | 0 refills | Status: DC | PRN

## 2022-06-11 NOTE — Progress Notes (Signed)
Virtual Visit Consent   [name], you are scheduled for a virtual visit with a [facility] provider today. Just as with appointments in the office, your consent must be obtained to participate. Your consent will be active for this visit and any virtual visit you may have with one of our providers in the next 365 days. If you have a MyChart account, a copy of this consent can be sent to you electronically.  As this is a virtual visit, video technology does not allow for your provider to perform a traditional examination. This may limit your provider's ability to fully assess your condition. If your provider identifies any concerns that need to be evaluated in person or the need to arrange testing (such as labs, EKG, etc.), we will make arrangements to do so. Although advances in technology are sophisticated, we cannot ensure that it will always work on either your end or our end. If the connection with a video visit is poor, the visit may have to be switched to a telephone visit. With either a video or telephone visit, we are not always able to ensure that we have a secure connection.  By engaging in this virtual visit, you consent to the provision of healthcare and authorize for your insurance to be billed (if applicable) for the services provided during this visit. Depending on your insurance coverage, you may receive a charge related to this service.  I need to obtain your verbal consent now. Are you willing to proceed with your visit today? Lanai Conlee Rennaker has provided verbal consent on 06/11/2022 for a virtual visit (video or telephone). Mar Daring, PA-C  Date: 06/11/2022 7:03 PM  Virtual Visit via Video Note   I, Mar Daring, connected with  [name]  ([id number], 1986-10-18) on 06/11/22 at  7:00 PM EDT by a video-enabled telemedicine application and verified that I am speaking with the correct person using two identifiers.  Location: Patient: Virtual Visit  Location Patient: Home Provider: Virtual Visit Location Provider: Home Office   I discussed the limitations of evaluation and management by telemedicine and the availability of in person appointments. The patient expressed understanding and agreed to proceed.    History of Present Illness: [name] is a 36 y.o. who identifies as a female who was assigned female at birth, and is being seen today for rash.  HPI: Rash This is a new problem. The affected locations include the left axilla and right axilla.      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     Methylphenidate Derivatives Other (See Comments)    Reaction:  Depression  and anger    Neurontin [Gabapentin] Other (See Comments)    Reaction:  Dizziness    Prozac [Fluoxetine Hcl] Other (See Comments)    Reaction:  Anger    Methylphenidate Hcl     SAME AS PREVIOUS LISTED METHYLPHENIDATE   Medications:  Current Outpatient Medications:    fluconazole (DIFLUCAN) 150 MG tablet, Take 1 tablet (150 mg total) by mouth every 3 (three) days.,  Disp: 3 tablet, Rfl: 0   ibuprofen (ADVIL) 800 MG tablet, Take 1 tablet (800 mg total) by mouth every 8 (eight) hours as needed., Disp: 30 tablet, Rfl: 0   nystatin cream (MYCOSTATIN), Apply 1 Application topically 2 (two) times daily., Disp: 60 g, Rfl: 0   acetaminophen (TYLENOL) 500 MG tablet, Take 2 tablets (1,000 mg total) by mouth every 8 (eight) hours as needed (pain)., Disp: 60 tablet, Rfl: 0   albuterol (VENTOLIN HFA) 108 (90 Base) MCG/ACT inhaler, Inhale 2 puffs into the lungs every 6 (six) hours as needed for wheezing or shortness of breath., Disp: 8 g, Rfl: 2   ALPRAZolam (XANAX) 0.5 MG tablet, Take 0.5 mg by mouth daily as needed., Disp: , Rfl:    ARIPiprazole (ABILIFY IM), Inject 300 mg into the muscle every 30 (thirty) days. (Patient not taking: Reported on 04/09/2022), Disp: , Rfl:    Blood Pressure Monitoring (BLOOD PRESSURE KIT) DEVI, 1 Device by Does not apply route once a week. (Patient not taking: Reported on 02/25/2022), Disp: 1 each, Rfl: 0   divalproex (DEPAKOTE ER) 500 MG 24 hr tablet, Take 500 mg by mouth at bedtime., Disp: , Rfl:    docusate sodium (COLACE) 100 MG capsule, Take 1 capsule (100 mg total) by mouth 2 (two) times daily., Disp: 30 capsule, Rfl: 0   mupirocin ointment (BACTROBAN) 2 %, Apply 1 Application topically 2 (two) times daily., Disp: 22 g, Rfl: 0   nystatin (MYCOSTATIN/NYSTOP) powder, Apply 1 Application topically 3 (three) times daily., Disp: 60 g, Rfl: 0   nystatin-triamcinolone ointment (MYCOLOG), Apply 1 application. topically 2 (two) times daily., Disp: 30 g, Rfl: 0   ondansetron (ZOFRAN) 4 MG tablet, Take 1 tablet (4 mg total) by mouth every 8 (eight) hours as needed for nausea or vomiting., Disp: 20 tablet, Rfl: 0   pantoprazole (PROTONIX) 40 MG tablet, Take 1 tablet (40 mg total) by mouth daily., Disp: 90 tablet, Rfl: 0   VRAYLAR 1.5 MG capsule, Take 1.5 mg by mouth daily., Disp: , Rfl:   Observations/Objective: Patient is well-developed,  well-nourished in no acute distress.  Resting comfortably at home.  Head is normocephalic, atraumatic.  No labored breathing.  Speech is clear and coherent with logical content.  Patient is alert and oriented at baseline.  Well demarcated rash in axilla bilaterally with irregular borders  Assessment and Plan: 1. Intertrigo - fluconazole (DIFLUCAN) 150 MG tablet; Take 1 tablet (150 mg total) by mouth every 3 (three) days.  Dispense: 3 tablet; Refill: 0 - nystatin cream (MYCOSTATIN); Apply 1 Application topically 2 (two) times daily.  Dispense: 60 g; Refill: 0  2. Other migraine without status migrainosus, not intractable - ibuprofen (ADVIL) 800 MG tablet; Take 1 tablet (800 mg total) by mouth every 8 (eight) hours as needed.  Dispense: 30 tablet; Refill: 0  - Recently had intertrigo under breast tissue, has been improving but now under axilla - Will give diflucan for overgrowth body yeast - Nystatin for topical use - Change out deodorant and razor in 3 days - Ibuprofen refilled  for migraines at patient request - Seek in person evaluation if rash continues to worsen  Follow Up Instructions: I discussed the assessment and treatment plan with the patient. The patient was provided an opportunity to ask questions and all were answered. The patient agreed with the plan and demonstrated an understanding of the instructions.  A copy of instructions were sent to the patient via MyChart unless otherwise noted below.    The patient was advised to call back or seek an in-person evaluation if the symptoms worsen or if the condition fails to improve as anticipated.  Time:  I spent 11 minutes with the patient via telehealth technology discussing the above problems/concerns.    Mar Daring, PA-C

## 2022-06-11 NOTE — Patient Instructions (Signed)
[name], thank you for joining Mar Daring, PA-C for today's virtual visit.  While this provider is not your primary care provider (PCP), if your PCP is located in our provider database this encounter information will be shared with them immediately following your visit.  Consent: (Patient) [name] provided verbal consent for this virtual visit at the beginning of the encounter.  Current Medications:  Current Outpatient Medications:    fluconazole (DIFLUCAN) 150 MG tablet, Take 1 tablet (150 mg total) by mouth every 3 (three) days., Disp: 3 tablet, Rfl: 0   ibuprofen (ADVIL) 800 MG tablet, Take 1 tablet (800 mg total) by mouth every 8 (eight) hours as needed., Disp: 30 tablet, Rfl: 0   nystatin cream (MYCOSTATIN), Apply 1 Application topically 2 (two) times daily., Disp: 60 g, Rfl: 0   acetaminophen (TYLENOL) 500 MG tablet, Take 2 tablets (1,000 mg total) by mouth every 8 (eight) hours as needed (pain)., Disp: 60 tablet, Rfl: 0   albuterol (VENTOLIN HFA) 108 (90 Base) MCG/ACT inhaler, Inhale 2 puffs into the lungs every 6 (six) hours as needed for wheezing or shortness of breath., Disp: 8 g, Rfl: 2   ALPRAZolam (XANAX) 0.5 MG tablet, Take 0.5 mg by mouth daily as needed., Disp: , Rfl:    ARIPiprazole (ABILIFY IM), Inject 300 mg into the muscle every 30 (thirty) days. (Patient not taking: Reported on 04/09/2022), Disp: , Rfl:    Blood Pressure Monitoring (BLOOD PRESSURE KIT) DEVI, 1 Device by Does not apply route once a week. (Patient not taking: Reported on 02/25/2022), Disp: 1 each, Rfl: 0   divalproex (DEPAKOTE ER) 500 MG 24 hr tablet, Take 500 mg by mouth at bedtime., Disp: , Rfl:    docusate sodium (COLACE) 100 MG capsule, Take 1 capsule (100 mg total) by mouth 2 (two) times daily., Disp: 30 capsule, Rfl: 0   mupirocin ointment (BACTROBAN) 2 %, Apply 1 Application topically 2 (two) times daily., Disp: 22 g, Rfl: 0   nystatin (MYCOSTATIN/NYSTOP) powder, Apply 1  Application topically 3 (three) times daily., Disp: 60 g, Rfl: 0   nystatin-triamcinolone ointment (MYCOLOG), Apply 1 application. topically 2 (two) times daily., Disp: 30 g, Rfl: 0   ondansetron (ZOFRAN) 4 MG tablet, Take 1 tablet (4 mg total) by mouth every 8 (eight) hours as needed for nausea or vomiting., Disp: 20 tablet, Rfl: 0   pantoprazole (PROTONIX) 40 MG tablet, Take 1 tablet (40 mg total) by mouth daily., Disp: 90 tablet, Rfl: 0   VRAYLAR 1.5 MG capsule, Take 1.5 mg by mouth daily., Disp: , Rfl:    Medications ordered in this encounter:  Meds ordered this encounter  Medications   fluconazole (DIFLUCAN) 150 MG tablet    Sig: Take 1 tablet (150 mg total) by mouth every 3 (three) days.    Dispense:  3 tablet    Refill:  0    Order Specific Question:   Supervising Provider    Answer:   MILLER, BRIAN [[id number]]   nystatin cream (MYCOSTATIN)    Sig: Apply 1 Application topically 2 (two) times daily.    Dispense:  60 g    Refill:  0    Order Specific Question:   Supervising Provider    Answer:   MILLER, BRIAN [[id number]]   ibuprofen (ADVIL) 800 MG tablet    Sig: Take 1 tablet (800 mg total) by mouth every 8 (eight) hours as needed.    Dispense:  30 tablet    Refill:  0    Order Specific Question:   Supervising Provider    Answer:   Noemi Chapel [[id number]]     *If you need refills on other medications prior to your next appointment, please contact your pharmacy*  Follow-Up: Call back or seek an in-person evaluation if the symptoms worsen or if the condition fails to improve as anticipated.  Other Instructions Intertrigo Intertrigo is skin irritation or inflammation (dermatitis) that occurs when folds of skin rub together. The irritation can cause a rash and make skin raw and itchy. This condition most commonly occurs in the skin folds of these areas: Toes. Armpits. Groin. Under the belly. Under the breasts. Buttocks. Intertrigo is not passed from person to person (is not  contagious). What are the causes? This condition is caused by heat, moisture, rubbing (friction), and not enough air circulation. The condition can be made worse by: Sweat. Bacteria. A fungus, such as yeast. What increases the risk? This condition is more likely to occur if you have moisture in your skin folds. You are more likely to develop this condition if you: Have diabetes. Are overweight. Are not able to move around or are not active. Live in a warm and moist climate. Wear splints, braces, or other medical devices. Are not able to control your bowels or bladder (have incontinence). What are the signs or symptoms? Symptoms of this condition include: A pink or red skin rash in the skin fold or near the skin fold. Raw or scaly skin. Itchiness. A burning feeling. Bleeding. Leaking fluid. A bad smell. How is this diagnosed? This condition is diagnosed with a medical history and physical exam. You may also have a skin swab to test for bacteria or a fungus. How is this treated? This condition may be treated by: Cleaning and drying your skin. Taking an antibiotic medicine or using an antibiotic skin cream for a bacterial infection. Using an antifungal cream on your skin or taking pills for an infection that was caused by a fungus, such as yeast. Using a steroid ointment to relieve itchiness and irritation. Separating the skin fold with a clean cotton cloth to absorb moisture and allow air to flow into the area. Follow these instructions at home: Keep the affected area clean and dry. Do not scratch your skin. Stay in a cool environment as much as possible. Use an air conditioner or fan, if available. Apply over-the-counter and prescription medicines only as told by your health care provider. If you were prescribed an antibiotic medicine, use it as told by your health care provider. Do not stop using the antibiotic even if your condition improves. Keep all follow-up visits as told  by your health care provider. This is important. How is this prevented?  Maintain a healthy weight. Take care of your feet, especially if you have diabetes. Foot care includes: Wearing shoes that fit well. Keeping your feet dry. Wearing clean, breathable socks. Protect the skin around your groin and buttocks, especially if you have incontinence. Skin protection includes: Following a regular cleaning routine. Using skin protectant creams, powders, or ointments. Changing protection pads frequently. Do not wear tight clothes. Wear clothes that are loose, absorbent, and made of cotton. Wear a bra that gives good support, if needed. Shower and dry yourself well after activity or exercise. Use a hair dryer on a cool setting to dry between skin folds, especially after you bathe. If you have diabetes, keep your blood sugar under control. Contact a health care provider if: Your symptoms  do not improve with treatment. Your symptoms get worse or they spread. You notice increased redness and warmth. You have a fever. Summary Intertrigo is skin irritation or inflammation (dermatitis) that occurs when folds of skin rub together. This condition is caused by heat, moisture, rubbing (friction), and not enough air circulation. This condition may be treated by cleaning and drying your skin and with medicines. Apply over-the-counter and prescription medicines only as told by your health care provider. Keep all follow-up visits as told by your health care provider. This is important. This information is not intended to replace advice given to you by your health care provider. Make sure you discuss any questions you have with your health care provider. Document Revised: 09/16/2021 Document Reviewed: 09/16/2021 Elsevier Patient Education  [place].    If you have been instructed to have an in-person evaluation today at a local Urgent Care facility, please use the link below. It will take you to a  list of all of our available [facility], including address, phone number and hours of operation. Please do not delay care.  [facility]  If you or a family member do not have a primary care provider, use the link below to schedule a visit and establish care. When you choose a [facility] primary care physician or advanced practice provider, you gain a long-term partner in health. Find a Primary Care Provider  Learn more about 's in-office and virtual care options: [facility]

## 2022-06-15 ENCOUNTER — Telehealth: Payer: 59 | Admitting: Nurse Practitioner

## 2022-06-15 DIAGNOSIS — F5101 Primary insomnia: Secondary | ICD-10-CM

## 2022-06-15 MED ORDER — HYDROXYZINE PAMOATE 25 MG PO CAPS: 25.0000 mg | ORAL_CAPSULE | Freq: Every evening | ORAL | 0 refills | Status: DC

## 2022-06-15 NOTE — Progress Notes (Signed)
Virtual Visit Consent   [name], you are scheduled for a virtual visit with Nichole Hassell Done, FNP, a [facility] provider, today.     Just as with appointments in the office, your consent must be obtained to participate.  Your consent will be active for this visit and any virtual visit you may have with one of our providers in the next 365 days.     If you have a MyChart account, a copy of this consent can be sent to you electronically.  All virtual visits are billed to your insurance company just like a traditional visit in the office.    As this is a virtual visit, video technology does not allow for your provider to perform a traditional examination.  This may limit your provider's ability to fully assess your condition.  If your provider identifies any concerns that need to be evaluated in person or the need to arrange testing (such as labs, EKG, etc.), we will make arrangements to do so.     Although advances in technology are sophisticated, we cannot ensure that it will always work on either your end or our end.  If the connection with a video visit is poor, the visit may have to be switched to a telephone visit.  With either a video or telephone visit, we are not always able to ensure that we have a secure connection.     I need to obtain your verbal consent now.   Are you willing to proceed with your visit today? YES   [name] has provided verbal consent on 06/15/2022 for a virtual visit (video or telephone).   Nichole Hassell Done, FNP   Date: 06/15/2022 4:37 PM   Virtual Visit via Video Note   I, [name], connected with [name] ([id number], Oct 16, 1986) on 06/15/22 at  4:45 PM EDT by a video-enabled telemedicine application and verified that I am speaking with the correct person using two identifiers.  Location: Patient: Virtual Visit Location Patient: Home Provider: Virtual Visit Location Provider: Mobile   I discussed the  limitations of evaluation and management by telemedicine and the availability of in person appointments. The patient expressed understanding and agreed to proceed.    History of Present Illness: [name] is a 36 y.o. who identifies as a female who was assigned female at birth, and is being seen today for insomnia.  HPI: Insomnia Primary symptoms: fragmented sleep, sleep disturbance, difficulty falling asleep.   The current episode started in the past 7 days. The onset quality is gradual. The problem occurs nightly. The problem has been waxing and waning since onset. The symptoms are aggravated by anxiety. How many beverages per day that contain caffeine: 2-3.  Types of beverages you drink: soda. Treatments tried: melatonin and trazadone. Typical bedtime:  10-11 P.M..  How long after going to bed to you fall asleep: over an hour.      Review of Systems  Psychiatric/Behavioral:  Positive for sleep disturbance. The patient has insomnia.     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      Methylphenidate Derivatives Other (See Comments)    Reaction:  Depression and anger    Neurontin [Gabapentin] Other (See Comments)    Reaction:  Dizziness    Prozac [Fluoxetine Hcl] Other (See Comments)    Reaction:  Anger    Methylphenidate Hcl     SAME AS PREVIOUS LISTED METHYLPHENIDATE   Medications:  Current Outpatient Medications:    acetaminophen (TYLENOL) 500 MG tablet, Take 2 tablets (1,000 mg total) by mouth every 8 (eight) hours as needed (pain)., Disp: 60 tablet, Rfl: 0   albuterol (VENTOLIN HFA) 108 (90 Base) MCG/ACT inhaler, Inhale 2 puffs into the lungs every 6 (six) hours as needed for wheezing or shortness of breath., Disp: 8 g, Rfl: 2   ALPRAZolam (XANAX) 0.5 MG tablet, Take 0.5 mg by mouth daily as needed., Disp: , Rfl:    ARIPiprazole (ABILIFY IM), Inject 300 mg into the muscle every 30 (thirty) days. (Patient not taking: Reported on 04/09/2022), Disp: , Rfl:    Blood Pressure Monitoring (BLOOD PRESSURE KIT) DEVI, 1 Device by Does not apply route once a week. (Patient not taking: Reported on 02/25/2022), Disp: 1 each, Rfl: 0   divalproex (DEPAKOTE ER) 500 MG 24 hr tablet, Take 500 mg by mouth at bedtime., Disp: , Rfl:    docusate sodium (COLACE) 100 MG capsule, Take 1 capsule (100 mg total) by mouth 2 (two) times daily., Disp: 30 capsule, Rfl: 0   fluconazole (DIFLUCAN) 150 MG tablet, Take 1 tablet (150 mg total) by mouth every 3 (three) days., Disp: 3 tablet, Rfl: 0   ibuprofen (ADVIL) 800 MG tablet, Take 1 tablet (800 mg total) by mouth every 8 (eight) hours as needed., Disp: 30 tablet, Rfl: 0   mupirocin ointment (BACTROBAN) 2 %, Apply 1 Application topically 2 (two) times daily., Disp: 22 g, Rfl: 0   nystatin (MYCOSTATIN/NYSTOP) powder, Apply 1 Application topically 3 (three) times daily., Disp: 60 g, Rfl: 0   nystatin cream (MYCOSTATIN), Apply 1 Application topically 2 (two) times daily., Disp: 60 g, Rfl: 0   nystatin-triamcinolone ointment (MYCOLOG), Apply 1 application.  topically 2 (two) times daily., Disp: 30 g, Rfl: 0   ondansetron (ZOFRAN) 4 MG tablet, Take 1 tablet (4 mg total) by mouth every 8 (eight) hours as needed for nausea or vomiting., Disp: 20 tablet, Rfl: 0   pantoprazole (PROTONIX) 40 MG tablet, Take 1 tablet (40 mg total) by mouth daily., Disp: 90 tablet, Rfl: 0   VRAYLAR 1.5 MG capsule, Take 1.5 mg by mouth daily., Disp: , Rfl:   Observations/Objective: Patient is well-developed, well-nourished in no acute distress.  Resting comfortably  at home.  Head is normocephalic, atraumatic.  No labored breathing.  Speech is clear and coherent with logical content.  Patient is alert and oriented at baseline.    Assessment and Plan:  Emy R Vanorder in today with chief complaint of Insomnia   1. Primary insomnia Bedtime routine Do not eat 2 hours prior to bedtime No caffeine Has used unisom in the past which helped-  was told to by OTC  Meds ordered this encounter  Medications   hydrOXYzine (VISTARIL) 25 MG capsule    Sig: Take 1 capsule (25 mg total) by mouth at bedtime.    Dispense:  3 capsule    Refill:  0    Order Specific Question:   Supervising Provider    Answer:   Noemi Chapel [[id number]]      Follow Up Instructions: I discussed the assessment and treatment plan with the patient. The patient was provided an opportunity to ask questions and all were answered. The patient agreed with the plan and demonstrated an understanding of the instructions.  A copy of instructions were sent to the patient via MyChart.  The patient was advised to call back or seek an in-person evaluation if the symptoms worsen or if the condition fails to improve as anticipated.  Time:  I spent 20 minutes with the patient via telehealth technology discussing the above problems/concerns.    Nichole Hassell Done, FNP

## 2022-06-15 NOTE — Patient Instructions (Signed)
Insomnia Insomnia is a sleep disorder that makes it difficult to fall asleep or stay asleep. Insomnia can cause fatigue, low energy, difficulty concentrating, mood swings, and poor performance at work or school. There are three different ways to classify insomnia: Difficulty falling asleep. Difficulty staying asleep. Waking up too early in the morning. Any type of insomnia can be long-term (chronic) or short-term (acute). Both are common. Short-term insomnia usually lasts for 3 months or less. Chronic insomnia occurs at least three times a week for longer than 3 months. What are the causes? Insomnia may be caused by another condition, situation, or substance, such as: Having certain mental health conditions, such as anxiety and depression. Using caffeine, alcohol, tobacco, or drugs. Having gastrointestinal conditions, such as gastroesophageal reflux disease (GERD). Having certain medical conditions. These include: Asthma. Alzheimer's disease. Stroke. Chronic pain. An overactive thyroid gland (hyperthyroidism). Other sleep disorders, such as restless legs syndrome and sleep apnea. Menopause. Sometimes, the cause of insomnia may not be known. What increases the risk? Risk factors for insomnia include: Gender. Females are affected more often than males. Age. Insomnia is more common as people get older. Stress and certain medical and mental health conditions. Lack of exercise. Having an irregular work schedule. This may include working night shifts and traveling between different time zones. What are the signs or symptoms? If you have insomnia, the main symptom is having trouble falling asleep or having trouble staying asleep. This may lead to other symptoms, such as: Feeling tired or having low energy. Feeling nervous about going to sleep. Not feeling rested in the morning. Having trouble concentrating. Feeling irritable, anxious, or depressed. How is this diagnosed? This condition  may be diagnosed based on: Your symptoms and medical history. Your health care provider may ask about: Your sleep habits. Any medical conditions you have. Your mental health. A physical exam. How is this treated? Treatment for insomnia depends on the cause. Treatment may focus on treating an underlying condition that is causing the insomnia. Treatment may also include: Medicines to help you sleep. Counseling or therapy. Lifestyle adjustments to help you sleep better. Follow these instructions at home: Eating and drinking  Limit or avoid alcohol, caffeinated beverages, and products that contain nicotine and tobacco, especially close to bedtime. These can disrupt your sleep. Do not eat a large meal or eat spicy foods right before bedtime. This can lead to digestive discomfort that can make it hard for you to sleep. Sleep habits  Keep a sleep diary to help you and your health care provider figure out what could be causing your insomnia. Write down: When you sleep. When you wake up during the night. How well you sleep and how rested you feel the next day. Any side effects of medicines you are taking. What you eat and drink. Make your bedroom a dark, comfortable place where it is easy to fall asleep. Put up shades or blackout curtains to block light from outside. Use a white noise machine to block noise. Keep the temperature cool. Limit screen use before bedtime. This includes: Not watching TV. Not using your smartphone, tablet, or computer. Stick to a routine that includes going to bed and waking up at the same times every day and night. This can help you fall asleep faster. Consider making a quiet activity, such as reading, part of your nighttime routine. Try to avoid taking naps during the day so that you sleep better at night. Get out of bed if you are still awake after   15 minutes of trying to sleep. Keep the lights down, but try reading or doing a quiet activity. When you feel  sleepy, go back to bed. General instructions Take over-the-counter and prescription medicines only as told by your health care provider. Exercise regularly as told by your health care provider. However, avoid exercising in the hours right before bedtime. Use relaxation techniques to manage stress. Ask your health care provider to suggest some techniques that may work well for you. These may include: Breathing exercises. Routines to release muscle tension. Visualizing peaceful scenes. Make sure that you drive carefully. Do not drive if you feel very sleepy. Keep all follow-up visits. This is important. Contact a health care provider if: You are tired throughout the day. You have trouble in your daily routine due to sleepiness. You continue to have sleep problems, or your sleep problems get worse. Get help right away if: You have thoughts about hurting yourself or someone else. Get help right away if you feel like you may hurt yourself or others, or have thoughts about taking your own life. Go to your nearest emergency room or: Call 911. Call the National Suicide Prevention Lifeline at 1-800-273-8255 or 988. This is open 24 hours a day. Text the Crisis Text Line at 741741. Summary Insomnia is a sleep disorder that makes it difficult to fall asleep or stay asleep. Insomnia can be long-term (chronic) or short-term (acute). Treatment for insomnia depends on the cause. Treatment may focus on treating an underlying condition that is causing the insomnia. Keep a sleep diary to help you and your health care provider figure out what could be causing your insomnia. This information is not intended to replace advice given to you by your health care provider. Make sure you discuss any questions you have with your health care provider. Document Revised: 11/11/2021 Document Reviewed: 11/11/2021 Elsevier Patient Education  2023 [employer]  

## 2022-07-01 ENCOUNTER — Ambulatory Visit: Payer: 59 | Admitting: Family

## 2022-07-11 ENCOUNTER — Telehealth: Payer: 59 | Admitting: Physician Assistant

## 2022-07-11 NOTE — Progress Notes (Signed)
Patient no showed appt despite sending 2 notifications, but patient was advised prior to appt that she should be seen in person for black urine, she just did not cancel the appt indicating she would be seen in person. Will mark no charge.

## 2022-07-19 ENCOUNTER — Telehealth: Payer: 59 | Admitting: Nurse Practitioner

## 2022-07-19 DIAGNOSIS — B3731 Acute candidiasis of vulva and vagina: Secondary | ICD-10-CM

## 2022-07-19 MED ORDER — FLUCONAZOLE 150 MG PO TABS: 150.0000 mg | ORAL_TABLET | Freq: Once | ORAL | 0 refills | Status: AC

## 2022-07-19 NOTE — Progress Notes (Signed)
Virtual Visit Consent   [name], you are scheduled for a virtual visit with [name], FNP, a [facility] [facility] provider, today.     Just as with appointments in the office, your consent must be obtained to participate.  Your consent will be active for this visit and any virtual visit you may have with one of our providers in the next 365 days.     If you have a MyChart account, a copy of this consent can be sent to you electronically.  All virtual visits are billed to your insurance company just like a traditional visit in the office.    As this is a virtual visit, video technology does not allow for your provider to perform a traditional examination.  This may limit your provider's ability to fully assess your condition.  If your provider identifies any concerns that need to be evaluated in person or the need to arrange testing (such as labs, EKG, etc.), we will make arrangements to do so.     Although advances in technology are sophisticated, we cannot ensure that it will always work on either your end or our end.  If the connection with a video visit is poor, the visit may have to be switched to a telephone visit.  With either a video or telephone visit, we are not always able to ensure that we have a secure connection.     I need to obtain your verbal consent now.   Are you willing to proceed with your visit today? YES   [name] has provided verbal consent on 07/19/2022 for a virtual visit (video or telephone).   [name], FNP   Date: 07/19/2022 4:02 PM   Virtual Visit via Video Note   I, [name], connected with [name] ([id number], Jan 11, 1986) on 07/19/22 at  4:00 PM EDT by a video-enabled telemedicine application and verified that I am speaking with the correct person using two identifiers.  Location: Patient: Virtual Visit Location Patient: Home Provider: Virtual Visit Location Provider: Mobile   I discussed the  limitations of evaluation and management by telemedicine and the availability of in person appointments. The patient expressed understanding and agreed to proceed.    History of Present Illness: [name] is a 36 y.o. who identifies as a female who was assigned female at birth, and is being seen today for vaginal discharge.  HPI: Vaginal Discharge The patient's primary symptoms include vaginal discharge. This is a new problem. Episode onset: 3 days ago. The problem occurs constantly. The problem has been gradually worsening. The patient is experiencing no pain. She is not pregnant. Pertinent negatives include no back pain, constipation, diarrhea, flank pain or frequency. The vaginal discharge was milky and copious. There has been no bleeding. She has not been passing clots. She has not been passing tissue. Nothing aggravates the symptoms. The treatment provided no relief. No (has not had sex since february), her partner does not have an STD. Her menstrual history has been regular.    Difficult for her describe discharge. She says thick at times, slight itching, but no fishy smell.  Child was crying in background so she was difficult to hear.  Review of Systems  Gastrointestinal:  Negative for constipation and diarrhea.  Genitourinary:  Positive for vaginal discharge. Negative for flank pain and frequency.  Musculoskeletal:  Negative for back pain.    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     Methylphenidate Derivatives Other (See Comments)    Reaction:  Depression and anger    Neurontin [Gabapentin] Other (See Comments)    Reaction:  Dizziness    Prozac [Fluoxetine Hcl] Other (See Comments)    Reaction:  Anger    Methylphenidate Hcl     SAME AS PREVIOUS LISTED METHYLPHENIDATE   Medications:  Current Outpatient Medications:    acetaminophen (TYLENOL) 500 MG tablet, Take 2 tablets (1,000 mg total) by mouth every 8 (eight) hours as needed (pain)., Disp: 60 tablet, Rfl: 0   albuterol (VENTOLIN HFA) 108 (90 Base) MCG/ACT inhaler, Inhale 2 puffs into the lungs every 6 (six) hours as needed for wheezing or shortness of breath., Disp: 8 g, Rfl: 2   ALPRAZolam (XANAX) 0.5 MG tablet, Take 0.5 mg by mouth daily as needed., Disp: , Rfl:    ARIPiprazole (ABILIFY IM), Inject 300 mg into the muscle every 30 (thirty) days. (Patient not taking: Reported on 04/09/2022), Disp: , Rfl:    Blood Pressure Monitoring (BLOOD PRESSURE KIT) DEVI, 1 Device by Does not apply route once a week. (Patient not taking: Reported on 02/25/2022), Disp: 1 each, Rfl: 0   divalproex (DEPAKOTE ER) 500 MG 24 hr tablet, Take 500 mg by mouth at bedtime., Disp: , Rfl:    docusate sodium (COLACE) 100 MG capsule, Take 1 capsule (100 mg total) by mouth 2 (two) times daily., Disp: 30 capsule, Rfl: 0   fluconazole (DIFLUCAN) 150 MG tablet, Take 1 tablet (150 mg total) by mouth every 3 (three) days., Disp: 3 tablet, Rfl: 0   hydrOXYzine (VISTARIL) 25 MG capsule, Take 1 capsule (25 mg total) by mouth at bedtime., Disp: 3 capsule,  Rfl: 0   ibuprofen (ADVIL) 800 MG tablet, Take 1 tablet (800 mg total) by mouth every 8 (eight) hours as needed., Disp: 30 tablet, Rfl: 0   mupirocin ointment (BACTROBAN) 2 %, Apply 1 Application topically 2 (two) times daily., Disp: 22 g, Rfl: 0   nystatin (MYCOSTATIN/NYSTOP) powder, Apply 1 Application topically 3 (three) times daily., Disp: 60 g, Rfl: 0   nystatin cream (MYCOSTATIN), Apply 1 Application topically 2 (two) times daily., Disp: 60 g, Rfl: 0   nystatin-triamcinolone ointment (MYCOLOG), Apply 1 application. topically 2 (two) times daily., Disp: 30 g, Rfl: 0   ondansetron (ZOFRAN) 4 MG tablet, Take 1 tablet (4 mg total) by mouth every 8 (eight) hours as needed for nausea or vomiting., Disp: 20 tablet, Rfl: 0   pantoprazole (PROTONIX) 40 MG tablet, Take 1 tablet (40 mg total) by mouth daily., Disp: 90 tablet, Rfl: 0   VRAYLAR 1.5 MG capsule, Take 1.5 mg by mouth daily., Disp: , Rfl:  Observations/Objective: Patient is well-developed, well-nourished in no acute distress.  Resting comfortably  at home.  Head is normocephalic, atraumatic.  No labored breathing.  Speech is clear and coherent with logical content.  Patient is alert and oriented at baseline.    Assessment and Plan:  [name] in today with chief complaint of Vaginal Discharge (.e)   1. Vaginal candidiasis Meds ordered this encounter  Medications   fluconazole (DIFLUCAN) 150 MG tablet    Sig: Take 1 tablet (150 mg total) by mouth once for 1 dose.    Dispense:  1 tablet    Refill:  0    Order Specific Question:   Supervising Provider    Answer:   Noemi Chapel [[id number]]   If this does not help, will need face to face visit to have discharge looked at for proper treatment.   Follow Up Instructions: I discussed the assessment and treatment plan with the patient. The patient was provided an opportunity to ask questions and all were answered. The patient agreed with the plan and demonstrated an  understanding of the instructions.  A copy of instructions were sent to the patient via MyChart.  The patient was advised to call back or seek an in-person evaluation if the symptoms worsen or if the condition fails to improve as anticipated.  Time:  I spent 7 minutes with the patient via telehealth technology discussing the above problems/concerns.    [name], FNP

## 2022-07-24 ENCOUNTER — Encounter (HOSPITAL_BASED_OUTPATIENT_CLINIC_OR_DEPARTMENT_OTHER): Payer: Self-pay | Admitting: Nurse Practitioner

## 2022-07-24 ENCOUNTER — Encounter (HOSPITAL_BASED_OUTPATIENT_CLINIC_OR_DEPARTMENT_OTHER): Payer: Self-pay

## 2022-07-24 ENCOUNTER — Ambulatory Visit (INDEPENDENT_AMBULATORY_CARE_PROVIDER_SITE_OTHER): Payer: 59 | Admitting: Nurse Practitioner

## 2022-08-08 ENCOUNTER — Telehealth: Payer: 59 | Admitting: Physician Assistant

## 2022-08-08 DIAGNOSIS — L304 Erythema intertrigo: Secondary | ICD-10-CM

## 2022-08-08 MED ORDER — KETOCONAZOLE 200 MG PO TABS: 200.0000 mg | ORAL_TABLET | Freq: Every day | ORAL | 0 refills | Status: DC

## 2022-08-08 NOTE — Patient Instructions (Addendum)
[name], thank you for joining Mar Daring, PA-C for today's virtual visit.  While this provider is not your primary care provider (PCP), if your PCP is located in our provider database this encounter information will be shared with them immediately following your visit.  Consent: (Patient) [name] provided verbal consent for this virtual visit at the beginning of the encounter.  Current Medications:  Current Outpatient Medications:    ketoconazole (NIZORAL) 200 MG tablet, Take 1 tablet (200 mg total) by mouth daily., Disp: 14 tablet, Rfl: 0   acetaminophen (TYLENOL) 500 MG tablet, Take 2 tablets (1,000 mg total) by mouth every 8 (eight) hours as needed (pain)., Disp: 60 tablet, Rfl: 0   albuterol (VENTOLIN HFA) 108 (90 Base) MCG/ACT inhaler, Inhale 2 puffs into the lungs every 6 (six) hours as needed for wheezing or shortness of breath., Disp: 8 g, Rfl: 2   ALPRAZolam (XANAX) 0.5 MG tablet, Take 0.5 mg by mouth daily as needed., Disp: , Rfl:    ARIPiprazole (ABILIFY IM), Inject 300 mg into the muscle every 30 (thirty) days., Disp: , Rfl:    Blood Pressure Monitoring (BLOOD PRESSURE KIT) DEVI, 1 Device by Does not apply route once a week., Disp: 1 each, Rfl: 0   divalproex (DEPAKOTE ER) 500 MG 24 hr tablet, Take 500 mg by mouth at bedtime., Disp: , Rfl:    docusate sodium (COLACE) 100 MG capsule, Take 1 capsule (100 mg total) by mouth 2 (two) times daily., Disp: 30 capsule, Rfl: 0   hydrOXYzine (VISTARIL) 25 MG capsule, Take 1 capsule (25 mg total) by mouth at bedtime., Disp: 3 capsule, Rfl: 0   ibuprofen (ADVIL) 800 MG tablet, Take 1 tablet (800 mg total) by mouth every 8 (eight) hours as needed., Disp: 30 tablet, Rfl: 0   mupirocin ointment (BACTROBAN) 2 %, Apply 1 Application topically 2 (two) times daily., Disp: 22 g, Rfl: 0   ondansetron (ZOFRAN) 4 MG tablet, Take 1 tablet (4 mg total) by mouth every 8 (eight) hours as needed for nausea or vomiting., Disp: 20  tablet, Rfl: 0   pantoprazole (PROTONIX) 40 MG tablet, Take 1 tablet (40 mg total) by mouth daily., Disp: 90 tablet, Rfl: 0   VRAYLAR 1.5 MG capsule, Take 1.5 mg by mouth daily., Disp: , Rfl:    Medications ordered in this encounter:  Meds ordered this encounter  Medications   ketoconazole (NIZORAL) 200 MG tablet    Sig: Take 1 tablet (200 mg total) by mouth daily.    Dispense:  14 tablet    Refill:  0    Order Specific Question:   Supervising Provider    Answer:   Sabra Heck, BRIAN [[id number]]     *If you need refills on other medications prior to your next appointment, please contact your pharmacy*  Follow-Up: Call back or seek an in-person evaluation if the symptoms worsen or if the condition fails to improve as anticipated.  Other Instructions  Ketoconazole Tablets What is this medication? KETOCONAZOLE (kee toe KON na zole) treats fungal or yeast infections. It belongs to a group of medications called antifungals. It will not treat colds, the flu, or infections caused by bacteria or viruses. This medicine may be used for other purposes; ask your health care provider or pharmacist if you have questions. COMMON BRAND NAME(S): Nizoral What should I tell my care team before I take this medication? They need to know if you have any of these conditions: Adrenal problems An  alcohol abuse problem History of irregular heartbeat Low stomach acid production Liver disease An unusual or allergic reaction to ketoconazole, itraconazole, miconazole, other medications, foods, dyes or preservatives Pregnant or trying to get pregnant Breast-feeding How should I use this medication? Take this medication by mouth with a full glass of water. Follow the directions on the prescription label. This medication works best if you take it with food. Take your medication at regular intervals. Do not take your medication more often than directed. Do not stop taking except on your care team's advice. A special  MedGuide will be given to you by the pharmacist with each prescription and refill. Be sure to read this information carefully each time. Talk to your care team about the use of this medication in children. Special care may be needed. Overdosage: If you think you have taken too much of this medicine contact a poison control center or emergency room at once. NOTE: This medicine is only for you. Do not share this medicine with others. What if I miss a dose? If you miss a dose, take it as soon as you can. If it is almost time for your next dose, take only that dose. Do not take double or extra doses. What may interact with this medication? Do not take this medication with any of the following: Adagrasib Alfuzosin Certain medications for anxiety or sleep, such as alprazolam, midazolam, triazolam Certain medications for blood pressure, such as felodipine, nisoldipine, eplerenone Certain medications for cancer, such as irinotecan, ibrutinib Certain medications for cholesterol, such as cerivastatin, lovastatin, simvastatin, lomitapide Certain medications for irregular heart rate, such as disopyramide, dofetilide, dronedarone, quinidine Cisapride Colchicine Conivaptan Ergot alkaloids, such as dihydroergotamine, ergonovine, ergotamine, methylergonovine Lurasidone Methadone Naloxegol Nevirapine Other medications that can cause heart rhythm changes Pimozide Ranolazine Red yeast rice Sirolimus Thioridazine Tolvaptan This medication may also interact with the following: Alcohol or any product that contains alcohol Aliskiren Amlodipine Antacids Antiviral medications for HIV or AIDS Aprepitant Atorvastatin Bosentan Buprenorphine Certain medications for bladder problems, such as fesoterodine, solifenacin, tolterodine Certain medications for cancer, such as bortezomib, busulfan, dasatinib, docetaxel, erlotinib, imatinib, ixabepilone, lapatinib, nilotinib, paclitaxel, trimetrexate, vinca  alkaloids Certain medications for depression, anxiety, or mental health conditions, such as aripiprazole, buspirone, haloperidol, quetiapine, risperidone Certain medications for erectile dysfunction, such as vardenafil, sildenafil, tadalafil Certain medications for pain, such as alfentanil, fentanyl, oxycodone, sufentanil Certain medications for seizures, such as carbamazepine or phenytoin Certain medications for stomach problems, such as cimetidine, famotidine, omeprazole, lansoprazole Certain medications for tuberculosis, such as isoniazid, INH, rifabutin, rifampin, rifapentine Certain medications that treat or prevent blood clots, such as dabigatran, rivaroxaban, warfarin Cilostazol Cinacalcet Cyclosporine Digoxin Eletriptan Isradipine Nadolol Nifedipine Other medications for fungal infections Praziquantel Ramelteon Repaglinide Salmeterol Saxagliptin Steroid medications, such as budesonide, ciclesonide, dexamethasone, methylprednisolone Tacrolimus Tamsulosin Telithromycin Verapamil Ziprasidone This list may not describe all possible interactions. Give your health care provider a list of all the medicines, herbs, non-prescription drugs, or dietary supplements you use. Also tell them if you smoke, drink alcohol, or use illegal drugs. Some items may interact with your medicine. What should I watch for while using this medication? Visit your care team for check-ups. Tell your care team if your symptoms do not improve. Some fungal infections can take many weeks or months of treatment to cure. Avoid medications for your stomach like antacids and acid blockers for at least two hours after taking this medication. You may get drowsy or dizzy. Do not drive, use machinery, or do anything that needs  mental alertness until you know how this medication affects you. Do not stand or sit up quickly, especially if you are an older patient. This reduces the risk of dizzy or fainting spells. Avoid  alcohol while you are taking this medication. Alcohol can increase the risk of liver damage. If you are going to have surgery, let your care team know that you have been taking this medication. This medication may cause a decrease in vitamin D. You should make sure that you get enough vitamin D while you are taking this medication. Discuss the foods you eat and the vitamins you take with your care team. What side effects may I notice from receiving this medication? Side effects that you should report to your care team as soon as possible: Allergic reactions--skin rash, itching, hives, swelling of the face, lips, tongue, or throat Heart rhythm changes--fast or irregular heartbeat, dizziness, feeling faint or lightheaded, chest pain, trouble breathing Liver injury--right upper belly pain, loss of appetite, nausea, light-colored stool, dark yellow or brown urine, yellowing skin or eyes, unusual weakness or fatigue Low adrenal gland function--nausea, vomiting, loss of appetite, unusual weakness or fatigue, dizziness Side effects that usually do not require medical attention (report to your care team if they continue or are bothersome): Change in sex drive or performance Diarrhea Headache Nausea Stomach pain Unexpected breast tissue growth This list may not describe all possible side effects. Call your doctor for medical advice about side effects. You may report side effects to FDA at 1-800-FDA-1088. Where should I keep my medication? Keep out of the reach of children. Store at room temperature between 15 and 25 degrees C (59 and 77 degrees F). Keep container tightly closed. Throw away any unused medication after the expiration date. NOTE: This sheet is a summary. It may not cover all possible information. If you have questions about this medicine, talk to your doctor, pharmacist, or health care provider.  2023 Elsevier/Gold Standard (2005-02-03 00:00:00)    If you have been instructed to have  an in-person evaluation today at a local Urgent Care facility, please use the link below. It will take you to a list of all of our available [facility], including address, phone number and hours of operation. Please do not delay care.  [facility]  If you or a family member do not have a primary care provider, use the link below to schedule a visit and establish care. When you choose a [facility] primary care physician or advanced practice provider, you gain a long-term partner in health. Find a Primary Care Provider  Learn more about [facility]'s in-office and virtual care options: [facility]

## 2022-08-08 NOTE — Progress Notes (Signed)
Virtual Visit Consent   [name], you are scheduled for a virtual visit with a [facility] provider today. Just as with appointments in the office, your consent must be obtained to participate. Your consent will be active for this visit and any virtual visit you may have with one of our providers in the next 365 days. If you have a MyChart account, a copy of this consent can be sent to you electronically.  As this is a virtual visit, video technology does not allow for your provider to perform a traditional examination. This may limit your provider's ability to fully assess your condition. If your provider identifies any concerns that need to be evaluated in person or the need to arrange testing (such as labs, EKG, etc.), we will make arrangements to do so. Although advances in technology are sophisticated, we cannot ensure that it will always work on either your end or our end. If the connection with a video visit is poor, the visit may have to be switched to a telephone visit. With either a video or telephone visit, we are not always able to ensure that we have a secure connection.  By engaging in this virtual visit, you consent to the provision of healthcare and authorize for your insurance to be billed (if applicable) for the services provided during this visit. Depending on your insurance coverage, you may receive a charge related to this service.  I need to obtain your verbal consent now. Are you willing to proceed with your visit today? [name] Grade has provided verbal consent on 08/08/2022 for a virtual visit (video or telephone). Mar Daring, PA-C  Date: 08/08/2022 7:19 PM  Virtual Visit via Video Note   I, Mar Daring, connected with  [name]  ([id number], [birth year]) on 08/08/22 at  7:15 PM EDT by a video-enabled telemedicine application and verified that I am speaking with the correct person using two identifiers.  Location: Patient: Virtual Visit  Location Patient: Home Provider: Virtual Visit Location Provider: Home Office   I discussed the limitations of evaluation and management by telemedicine and the availability of in person appointments. The patient expressed understanding and agreed to proceed.    History of Present Illness: [name] is a 36 y.o. who identifies as a female who was assigned female at birth, and is being seen today for rash.  HPI: Rash This is a recurrent problem. The current episode started more than 1 month ago. The problem has been waxing and waning since onset. The affected locations include the groin, abdomen, torso and chest (skin folds). The rash is characterized by burning, redness and itchiness. She was exposed to nothing. Pertinent negatives include no congestion, cough, diarrhea, fatigue, fever or shortness of breath. Past treatments include anti-itch cream (antifungals: fluconazole, nystatin cream and powder). The treatment provided no relief.     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      Methylphenidate Derivatives Other (See Comments)    Reaction:  Depression and anger    Neurontin [Gabapentin] Other (See Comments)    Reaction:  Dizziness    Prozac [Fluoxetine Hcl] Other (See Comments)    Reaction:  Anger    Methylphenidate Hcl     SAME AS PREVIOUS LISTED METHYLPHENIDATE   Medications:  Current Outpatient Medications:    ketoconazole (NIZORAL) 200 MG tablet, Take 1 tablet (200 mg total) by mouth daily., Disp: 14 tablet, Rfl: 0   acetaminophen (TYLENOL) 500 MG tablet, Take 2 tablets (1,000 mg total) by mouth every 8 (eight) hours as needed (pain)., Disp: 60 tablet, Rfl: 0   albuterol (VENTOLIN HFA) 108 (90 Base) MCG/ACT inhaler, Inhale 2 puffs into the lungs every 6 (six) hours as needed for wheezing or shortness of breath., Disp: 8 g, Rfl: 2   ALPRAZolam (XANAX) 0.5 MG tablet, Take 0.5 mg by mouth daily as needed., Disp: , Rfl:    ARIPiprazole (ABILIFY IM), Inject 300 mg into the muscle every 30 (thirty) days., Disp: , Rfl:    Blood Pressure Monitoring (BLOOD PRESSURE KIT) DEVI, 1 Device by Does not apply route once a week., Disp: 1 each, Rfl: 0   divalproex (DEPAKOTE ER) 500 MG 24 hr tablet, Take 500 mg by mouth at bedtime., Disp: , Rfl:    docusate sodium (COLACE) 100 MG capsule, Take 1 capsule (100 mg total) by mouth 2 (two) times daily., Disp: 30 capsule, Rfl: 0   hydrOXYzine (VISTARIL) 25 MG capsule, Take 1 capsule (25 mg total) by mouth at bedtime., Disp: 3 capsule, Rfl: 0   ibuprofen (ADVIL) 800 MG tablet, Take 1 tablet (800 mg total) by mouth every 8 (eight) hours as needed., Disp: 30 tablet, Rfl: 0   mupirocin ointment (BACTROBAN) 2 %, Apply 1 Application topically 2 (two) times daily., Disp: 22 g, Rfl: 0   ondansetron (ZOFRAN) 4 MG tablet, Take 1 tablet (4 mg total) by mouth every 8 (eight) hours as needed for nausea or vomiting., Disp: 20 tablet, Rfl: 0   pantoprazole (PROTONIX) 40 MG tablet, Take 1 tablet (40 mg total) by mouth daily., Disp: 90 tablet, Rfl: 0    VRAYLAR 1.5 MG capsule, Take 1.5 mg by mouth daily., Disp: , Rfl:   Observations/Objective: Patient is well-developed, well-nourished in no acute distress.  Resting comfortably at home.  Head is normocephalic, atraumatic.  No labored breathing.  Speech is clear and coherent with logical content.  Patient is alert and oriented at baseline.    Assessment and Plan: 1. Intertrigo - ketoconazole (NIZORAL) 200 MG tablet; Take 1 tablet (200 mg total) by mouth daily.  Dispense: 14 tablet; Refill: 0  - Failed Fluconazole, Nystatin cream and Nystatin powder - It did improve some, but has never went completely away; Nystatin cream has been keeping it in check - Will trial ketoconazole 14 day, LFT normal at last check within last year - Keep skin clean and dry - Interdry pads can help with moisture absorption - Wear loose fitting clothing - May require in person evaluation with culture or further examinationif not improving  Follow Up Instructions: I discussed the assessment and treatment plan with the patient. The patient was  provided an opportunity to ask questions and all were answered. The patient agreed with the plan and demonstrated an understanding of the instructions.  A copy of instructions were sent to the patient via MyChart unless otherwise noted below.    The patient was advised to call back or seek an in-person evaluation if the symptoms worsen or if the condition fails to improve as anticipated.  Time:  I spent 10 minutes with the patient via telehealth technology discussing the above problems/concerns.    Mar Daring, PA-C

## 2022-08-11 ENCOUNTER — Telehealth (HOSPITAL_BASED_OUTPATIENT_CLINIC_OR_DEPARTMENT_OTHER): Payer: 59 | Admitting: Nurse Practitioner

## 2022-08-11 ENCOUNTER — Encounter (HOSPITAL_BASED_OUTPATIENT_CLINIC_OR_DEPARTMENT_OTHER): Payer: Self-pay

## 2022-08-24 ENCOUNTER — Telehealth: Payer: Self-pay | Admitting: Nurse Practitioner

## 2022-08-24 ENCOUNTER — Telehealth: Payer: 59

## 2022-08-24 ENCOUNTER — Telehealth: Payer: 59 | Admitting: Physician Assistant

## 2022-08-24 DIAGNOSIS — L304 Erythema intertrigo: Secondary | ICD-10-CM

## 2022-08-24 MED ORDER — MUPIROCIN 2 % EX OINT: 1.0000 | TOPICAL_OINTMENT | Freq: Two times a day (BID) | CUTANEOUS | 0 refills | Status: DC

## 2022-08-24 MED ORDER — NYSTATIN 100000 UNIT/GM EX CREA: 1.0000 | TOPICAL_CREAM | Freq: Two times a day (BID) | CUTANEOUS | 0 refills | Status: DC

## 2022-08-24 MED ORDER — NYSTATIN-TRIAMCINOLONE 100000-0.1 UNIT/GM-% EX OINT: 1.0000 | TOPICAL_OINTMENT | Freq: Two times a day (BID) | CUTANEOUS | 0 refills | Status: DC

## 2022-08-24 MED ORDER — TRIAMCINOLONE ACETONIDE 0.1 % EX CREA: 1.0000 | TOPICAL_CREAM | Freq: Two times a day (BID) | CUTANEOUS | 0 refills | Status: DC

## 2022-08-24 MED ORDER — FLUCONAZOLE 150 MG PO TABS: 150.0000 mg | ORAL_TABLET | ORAL | 0 refills | Status: DC

## 2022-08-24 NOTE — Telephone Encounter (Signed)
Patient completed VV today and dxed with intertrigo. Patient was prescribed Mycolog cream but insurance requires prior authorization. VV does not do prior auths. Patient was prescribed Nystatin and triamcinolone cream in 2 prescriptions. Patient notified via [place].

## 2022-08-24 NOTE — Progress Notes (Signed)
Virtual Visit Consent   [name], you are scheduled for a virtual visit with a [facility] provider today. Just as with appointments in the office, your consent must be obtained to participate. Your consent will be active for this visit and any virtual visit you may have with one of our providers in the next 365 days. If you have a MyChart account, a copy of this consent can be sent to you electronically.  As this is a virtual visit, video technology does not allow for your provider to perform a traditional examination. This may limit your provider's ability to fully assess your condition. If your provider identifies any concerns that need to be evaluated in person or the need to arrange testing (such as labs, EKG, etc.), we will make arrangements to do so. Although advances in technology are sophisticated, we cannot ensure that it will always work on either your end or our end. If the connection with a video visit is poor, the visit may have to be switched to a telephone visit. With either a video or telephone visit, we are not always able to ensure that we have a secure connection.  By engaging in this virtual visit, you consent to the provision of healthcare and authorize for your insurance to be billed (if applicable) for the services provided during this visit. Depending on your insurance coverage, you may receive a charge related to this service.  I need to obtain your verbal consent now. Are you willing to proceed with your visit today? [name] has provided verbal consent on 08/24/2022 for a virtual visit (video or telephone). Mar Daring, PA-C  Date: 08/24/2022 8:49 AM  Virtual Visit via Video Note   I, Mar Daring, connected with  [name]  ([id number], [birth year]) on 08/24/22 at  8:45 AM EDT by a video-enabled telemedicine application and verified that I am speaking with the correct person using two identifiers.  Location: Patient: Virtual Visit  Location Patient: Home Provider: Virtual Visit Location Provider: Home Office   I discussed the limitations of evaluation and management by telemedicine and the availability of in person appointments. The patient expressed understanding and agreed to proceed.    History of Present Illness: [name] is a 36 y.o. who identifies as a female who was assigned female at birth, and is being seen today for continued intertrigo rash. She has had recurrence of symptoms all summer. She has tried Fluconazole (helped the most, but symptoms returned quickly), Nystatin cream and powder (make it itch more), and Ketoconazole (no improvement, but did not worsen). Areas affected are in skin folds that hold moisture (under breast is worst area).    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     Methylphenidate Derivatives Other (See Comments)    Reaction:  Depression and anger    Neurontin [Gabapentin] Other (See Comments)    Reaction:  Dizziness    Prozac [Fluoxetine Hcl] Other (See Comments)    Reaction:  Anger     Methylphenidate Hcl     SAME AS PREVIOUS LISTED METHYLPHENIDATE   Medications:  Current Outpatient Medications:    fluconazole (DIFLUCAN) 150 MG tablet, Take 1 tablet (150 mg total) by mouth once a week., Disp: 4 tablet, Rfl: 0   mupirocin ointment (BACTROBAN) 2 %, Apply 1 Application topically 2 (two) times daily., Disp: 22 g, Rfl: 0   nystatin-triamcinolone ointment (MYCOLOG), Apply 1 Application topically 2 (two) times daily., Disp: 30 g, Rfl: 0   acetaminophen (TYLENOL) 500 MG tablet, Take 2 tablets (1,000 mg total) by mouth every 8 (eight) hours as needed (pain)., Disp: 60 tablet, Rfl: 0   albuterol (VENTOLIN HFA) 108 (90 Base) MCG/ACT inhaler, Inhale 2 puffs into the lungs every 6 (six) hours as needed for wheezing or shortness of breath., Disp: 8 g, Rfl: 2   ALPRAZolam (XANAX) 0.5 MG tablet, Take 0.5 mg by mouth daily as needed., Disp: , Rfl:    ARIPiprazole (ABILIFY IM), Inject 300 mg into the muscle every 30 (thirty) days., Disp: , Rfl:    Blood Pressure Monitoring (BLOOD PRESSURE KIT) DEVI, 1 Device by Does not apply route once a week., Disp: 1 each, Rfl: 0   divalproex (DEPAKOTE ER) 500 MG 24 hr tablet, Take 500 mg by mouth at bedtime., Disp: , Rfl:    docusate sodium (COLACE) 100 MG capsule, Take 1 capsule (100 mg total) by mouth 2 (two) times daily., Disp: 30 capsule, Rfl: 0   hydrOXYzine (VISTARIL) 25 MG capsule, Take 1 capsule (25 mg total) by mouth at bedtime., Disp: 3 capsule, Rfl: 0   ibuprofen (ADVIL) 800 MG tablet, Take 1 tablet (800 mg total) by mouth every 8 (eight) hours as needed., Disp: 30 tablet, Rfl: 0   ketoconazole (NIZORAL) 200 MG tablet, Take 1 tablet (200 mg total) by mouth daily., Disp: 14 tablet, Rfl: 0   ondansetron (ZOFRAN) 4 MG tablet, Take 1 tablet (4 mg total) by mouth every 8 (eight) hours as needed for nausea or vomiting., Disp: 20 tablet, Rfl: 0   pantoprazole (PROTONIX) 40 MG tablet, Take 1 tablet (40 mg total) by mouth daily., Disp: 90 tablet, Rfl:  0   VRAYLAR 1.5 MG capsule, Take 1.5 mg by mouth daily., Disp: , Rfl:   Observations/Objective: Patient is well-developed, well-nourished in no acute distress.  Resting comfortably at home.  Head is normocephalic, atraumatic.  No labored breathing.  Speech is clear and coherent with logical content.  Patient is alert and oriented at baseline.    Assessment and Plan: 1. Intertrigo - nystatin-triamcinolone ointment (MYCOLOG); Apply 1 Application topically 2 (two) times daily.  Dispense: 30 g; Refill: 0 - mupirocin ointment (BACTROBAN) 2 %; Apply 1 Application topically 2 (two) times daily.  Dispense: 22 g; Refill: 0 - fluconazole (DIFLUCAN) 150 MG tablet; Take 1 tablet (150 mg total) by mouth once a week.  Dispense: 4 tablet; Refill: 0  - Will restart Diflucan, but use on a weekly schedule to hopefully clear out infection - Mycolog prescribed for topical use to add in the triamcinolone (steroid) for itching and inflammation of the skin from a chronic infection -  Mupirocin added for possible secondary bacterial infection from scratching - Keep skin clean and dry - Interdry pads may be beneficial - Add skin barrier like Desitin in commonly irritated areas - Seek in person evaluation if not improving or if worsening  Follow Up Instructions: I discussed the assessment and treatment plan with the patient. The patient was provided an opportunity to ask questions and all were answered. The patient agreed with the plan and demonstrated an understanding of the instructions.  A copy of instructions were sent to the patient via MyChart unless otherwise noted below.    The patient was advised to call back or seek an in-person evaluation if the symptoms worsen or if the condition fails to improve as anticipated.  Time:  I spent 10 minutes with the patient via telehealth technology discussing the above problems/concerns.    Mar Daring, PA-C

## 2022-08-24 NOTE — Patient Instructions (Addendum)
[name], thank you for joining  M , PA-C for today's virtual visit.  While this provider is not your primary care provider (PCP), if your PCP is located in our provider database this encounter information will be shared with them immediately following your visit.  Consent: (Patient) [name] provided verbal consent for this virtual visit at the beginning of the encounter.  Current Medications:  Current Outpatient Medications:    fluconazole (DIFLUCAN) 150 MG tablet, Take 1 tablet (150 mg total) by mouth once a week., Disp: 4 tablet, Rfl: 0   mupirocin ointment (BACTROBAN) 2 %, Apply 1 Application topically 2 (two) times daily., Disp: 22 g, Rfl: 0   nystatin-triamcinolone ointment (MYCOLOG), Apply 1 Application topically 2 (two) times daily., Disp: 30 g, Rfl: 0   acetaminophen (TYLENOL) 500 MG tablet, Take 2 tablets (1,000 mg total) by mouth every 8 (eight) hours as needed (pain)., Disp: 60 tablet, Rfl: 0   albuterol (VENTOLIN HFA) 108 (90 Base) MCG/ACT inhaler, Inhale 2 puffs into the lungs every 6 (six) hours as needed for wheezing or shortness of breath., Disp: 8 g, Rfl: 2   ALPRAZolam (XANAX) 0.5 MG tablet, Take 0.5 mg by mouth daily as needed., Disp: , Rfl:    ARIPiprazole (ABILIFY IM), Inject 300 mg into the muscle every 30 (thirty) days., Disp: , Rfl:    Blood Pressure Monitoring (BLOOD PRESSURE KIT) DEVI, 1 Device by Does not apply route once a week., Disp: 1 each, Rfl: 0   divalproex (DEPAKOTE ER) 500 MG 24 hr tablet, Take 500 mg by mouth at bedtime., Disp: , Rfl:    docusate sodium (COLACE) 100 MG capsule, Take 1 capsule (100 mg total) by mouth 2 (two) times daily., Disp: 30 capsule, Rfl: 0   hydrOXYzine (VISTARIL) 25 MG capsule, Take 1 capsule (25 mg total) by mouth at bedtime., Disp: 3 capsule, Rfl: 0   ibuprofen (ADVIL) 800 MG tablet, Take 1 tablet (800 mg total) by mouth every 8 (eight) hours as needed., Disp: 30 tablet, Rfl: 0   ketoconazole  (NIZORAL) 200 MG tablet, Take 1 tablet (200 mg total) by mouth daily., Disp: 14 tablet, Rfl: 0   ondansetron (ZOFRAN) 4 MG tablet, Take 1 tablet (4 mg total) by mouth every 8 (eight) hours as needed for nausea or vomiting., Disp: 20 tablet, Rfl: 0   pantoprazole (PROTONIX) 40 MG tablet, Take 1 tablet (40 mg total) by mouth daily., Disp: 90 tablet, Rfl: 0   VRAYLAR 1.5 MG capsule, Take 1.5 mg by mouth daily., Disp: , Rfl:    Medications ordered in this encounter:  Meds ordered this encounter  Medications   nystatin-triamcinolone ointment (MYCOLOG)    Sig: Apply 1 Application topically 2 (two) times daily.    Dispense:  30 g    Refill:  0    Order Specific Question:   Supervising Provider    Answer:   LAMPTEY, PHILIP O [[id number]]   mupirocin ointment (BACTROBAN) 2 %    Sig: Apply 1 Application topically 2 (two) times daily.    Dispense:  22 g    Refill:  0    Order Specific Question:   Supervising Provider    Answer:   LAMPTEY, PHILIP O [[id number]]   fluconazole (DIFLUCAN) 150 MG tablet    Sig: Take 1 tablet (150 mg total) by mouth once a week.    Dispense:  4 tablet    Refill:  0    Order Specific Question:   Supervising   Provider    Answer:   LAMPTEY, PHILIP O [[id number]]     *If you need refills on other medications prior to your next appointment, please contact your pharmacy*  Follow-Up: Call back or seek an in-person evaluation if the symptoms worsen or if the condition fails to improve as anticipated.  Other Instructions  - Will restart Diflucan, but use on a weekly schedule to hopefully clear out infection - Mycolog prescribed for topical use to add in the triamcinolone (steroid) for itching and inflammation of the skin from a chronic infection - Mupirocin added for possible secondary bacterial infection from scratching - Keep skin clean and dry - Interdry pads may be beneficial - Add skin barrier like Desitin in commonly irritated areas - Seek in person evaluation if not  improving or if worsening   If you have been instructed to have an in-person evaluation today at a local Urgent Care facility, please use the link below. It will take you to a list of all of our available [facility], including address, phone number and hours of operation. Please do not delay care.  [facility]  If you or a family member do not have a primary care provider, use the link below to schedule a visit and establish care. When you choose a [facility] primary care physician or advanced practice provider, you gain a long-term partner in health. Find a Primary Care Provider  Learn more about [facility]'s in-office and virtual care options: [facility] - Get Care Now 

## 2022-08-25 ENCOUNTER — Encounter (HOSPITAL_BASED_OUTPATIENT_CLINIC_OR_DEPARTMENT_OTHER): Payer: Self-pay

## 2022-08-25 ENCOUNTER — Ambulatory Visit (HOSPITAL_BASED_OUTPATIENT_CLINIC_OR_DEPARTMENT_OTHER): Payer: 59 | Admitting: Family Medicine

## 2022-08-30 ENCOUNTER — Telehealth: Payer: 59 | Admitting: Nurse Practitioner

## 2022-08-31 NOTE — Progress Notes (Signed)
Visit not completed.

## 2022-09-12 ENCOUNTER — Telehealth: Payer: 59 | Admitting: Physician Assistant

## 2022-09-12 DIAGNOSIS — J029 Acute pharyngitis, unspecified: Secondary | ICD-10-CM

## 2022-09-12 MED ORDER — PSEUDOEPH-BROMPHEN-DM 30-2-10 MG/5ML PO SYRP: 5.0000 mL | ORAL_SOLUTION | Freq: Four times a day (QID) | ORAL | 0 refills | Status: DC | PRN

## 2022-09-12 NOTE — Progress Notes (Signed)
Virtual Visit Consent   [name], you are scheduled for a virtual visit with a [facility] provider today. Just as with appointments in the office, your consent must be obtained to participate. Your consent will be active for this visit and any virtual visit you may have with one of our providers in the next 365 days. If you have a MyChart account, a copy of this consent can be sent to you electronically.  As this is a virtual visit, video technology does not allow for your provider to perform a traditional examination. This may limit your provider's ability to fully assess your condition. If your provider identifies any concerns that need to be evaluated in person or the need to arrange testing (such as labs, EKG, etc.), we will make arrangements to do so. Although advances in technology are sophisticated, we cannot ensure that it will always work on either your end or our end. If the connection with a video visit is poor, the visit may have to be switched to a telephone visit. With either a video or telephone visit, we are not always able to ensure that we have a secure connection.  By engaging in this virtual visit, you consent to the provision of healthcare and authorize for your insurance to be billed (if applicable) for the services provided during this visit. Depending on your insurance coverage, you may receive a charge related to this service.  I need to obtain your verbal consent now. Are you willing to proceed with your visit today? [name] has provided verbal consent on 09/12/2022 for a virtual visit (video or telephone). Mar Daring, PA-C  Date: 09/12/2022 7:22 PM  Virtual Visit via Video Note   I, Mar Daring, connected with  [name]  ([id number], [birth year]) on 09/12/22 at  7:15 PM EDT by a video-enabled telemedicine application and verified that I am speaking with the correct person using two identifiers.  Location: Patient: Virtual Visit  Location Patient: Home Provider: Virtual Visit Location Provider: Home Office   I discussed the limitations of evaluation and management by telemedicine and the availability of in person appointments. The patient expressed understanding and agreed to proceed.    History of Present Illness: [name] is a 36 y.o. who identifies as a female who was assigned female at birth, and is being seen today for URI symptoms.  HPI: URI  This is a new problem. The current episode started yesterday. The problem has been gradually worsening. There has been no fever. Associated symptoms include congestion, coughing and a sore throat. Pertinent negatives include no diarrhea, ear pain, headaches, nausea, plugged ear sensation, rhinorrhea, sinus pain or vomiting. She has tried nothing for the symptoms. The treatment provided no relief.      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     Methylphenidate Derivatives Other (See Comments)    Reaction:  Depression and anger    Neurontin [Gabapentin] Other (See  Comments)    Reaction:  Dizziness    Prozac [Fluoxetine Hcl] Other (See Comments)    Reaction:  Anger    Methylphenidate Hcl     SAME AS PREVIOUS LISTED METHYLPHENIDATE   Medications:  Current Outpatient Medications:    brompheniramine-pseudoephedrine-DM 30-2-10 MG/5ML syrup, Take 5 mLs by mouth 4 (four) times daily as needed., Disp: 120 mL, Rfl: 0   acetaminophen (TYLENOL) 500 MG tablet, Take 2 tablets (1,000 mg total) by mouth every 8 (eight) hours as needed (pain)., Disp: 60 tablet, Rfl: 0   albuterol (VENTOLIN HFA) 108 (90 Base) MCG/ACT inhaler, Inhale 2 puffs into the lungs every 6 (six) hours as needed for wheezing or shortness of breath., Disp: 8 g, Rfl: 2   ALPRAZolam (XANAX) 0.5 MG tablet, Take 0.5 mg by mouth daily as needed., Disp: , Rfl:    ARIPiprazole (ABILIFY IM), Inject 300 mg into the muscle every 30 (thirty) days., Disp: , Rfl:    Blood Pressure Monitoring (BLOOD PRESSURE KIT) DEVI, 1 Device by Does not apply route once a week., Disp: 1 each, Rfl: 0   divalproex (DEPAKOTE ER) 500 MG 24 hr tablet, Take 500 mg by mouth at bedtime., Disp: , Rfl:    docusate sodium (COLACE) 100 MG capsule, Take 1 capsule (100 mg total) by mouth 2 (two) times daily., Disp: 30 capsule, Rfl: 0   fluconazole (DIFLUCAN) 150 MG tablet, Take 1 tablet (150 mg total) by mouth once a week., Disp: 4 tablet, Rfl: 0   hydrOXYzine (VISTARIL) 25 MG capsule, Take 1 capsule (25 mg total) by mouth at bedtime., Disp: 3 capsule, Rfl: 0   ibuprofen (ADVIL) 800 MG tablet, Take 1 tablet (800 mg total) by mouth every 8 (eight) hours as needed., Disp: 30 tablet, Rfl: 0   ketoconazole (NIZORAL) 200 MG tablet, Take 1 tablet (200 mg total) by mouth daily., Disp: 14 tablet, Rfl: 0   mupirocin ointment (BACTROBAN) 2 %, Apply 1 Application topically 2 (two) times daily., Disp: 22 g, Rfl: 0   nystatin cream (MYCOSTATIN), Apply 1 Application topically 2 (two) times daily., Disp: 30 g, Rfl: 0   nystatin-triamcinolone ointment  (MYCOLOG), Apply 1 Application topically 2 (two) times daily., Disp: 30 g, Rfl: 0   ondansetron (ZOFRAN) 4 MG tablet, Take 1 tablet (4 mg total) by mouth every 8 (eight) hours as needed for nausea or vomiting., Disp: 20 tablet, Rfl: 0   pantoprazole (PROTONIX) 40 MG tablet, Take 1 tablet (40 mg total) by mouth daily., Disp: 90 tablet, Rfl: 0   triamcinolone cream (KENALOG) 0.1 %, Apply 1 Application topically 2 (two) times daily., Disp: 30 g, Rfl: 0   VRAYLAR 1.5 MG capsule, Take 1.5 mg by mouth daily., Disp: , Rfl:   Observations/Objective: Patient is well-developed, well-nourished in no acute distress.  Resting comfortably at home.  Head is normocephalic, atraumatic.  No labored breathing.  Speech is clear and coherent with logical content.  Patient is alert and oriented at baseline.    Assessment and Plan: 1. Viral pharyngitis - brompheniramine-pseudoephedrine-DM 30-2-10 MG/5ML syrup; Take 5 mLs by mouth 4 (four) times daily as needed.  Dispense: 120 mL; Refill: 0  -  Suspect viral pharyngitis - Will give Bromfed DM for cough  - Chloraseptic spray as needed - Salt water gargles - Steam and humidifier can help - Stay well hydrated and get plenty of rest.  - Seek in person evaluation if no symptom improvement or if symptoms worsen   Follow Up Instructions: I discussed the assessment and treatment plan with the patient. The patient was provided an opportunity to ask questions and all were answered. The patient agreed with the plan and demonstrated an understanding of the instructions.  A copy of instructions were sent to the patient via MyChart unless otherwise noted below.    The patient was advised to call back or seek an in-person evaluation if the symptoms worsen or if the condition fails to improve as anticipated.  Time:  I spent 10 minutes with the patient via telehealth technology discussing the above problems/concerns.    Mar Daring, PA-C

## 2022-09-12 NOTE — Patient Instructions (Signed)
[name], thank you for joining Mar Daring, PA-C for today's virtual visit.  While this provider is not your primary care provider (PCP), if your PCP is located in our provider database this encounter information will be shared with them immediately following your visit.  Consent: (Patient) [name] provided verbal consent for this virtual visit at the beginning of the encounter.  Current Medications:  Current Outpatient Medications:    brompheniramine-pseudoephedrine-DM 30-2-10 MG/5ML syrup, Take 5 mLs by mouth 4 (four) times daily as needed., Disp: 120 mL, Rfl: 0   acetaminophen (TYLENOL) 500 MG tablet, Take 2 tablets (1,000 mg total) by mouth every 8 (eight) hours as needed (pain)., Disp: 60 tablet, Rfl: 0   albuterol (VENTOLIN HFA) 108 (90 Base) MCG/ACT inhaler, Inhale 2 puffs into the lungs every 6 (six) hours as needed for wheezing or shortness of breath., Disp: 8 g, Rfl: 2   ALPRAZolam (XANAX) 0.5 MG tablet, Take 0.5 mg by mouth daily as needed., Disp: , Rfl:    ARIPiprazole (ABILIFY IM), Inject 300 mg into the muscle every 30 (thirty) days., Disp: , Rfl:    Blood Pressure Monitoring (BLOOD PRESSURE KIT) DEVI, 1 Device by Does not apply route once a week., Disp: 1 each, Rfl: 0   divalproex (DEPAKOTE ER) 500 MG 24 hr tablet, Take 500 mg by mouth at bedtime., Disp: , Rfl:    docusate sodium (COLACE) 100 MG capsule, Take 1 capsule (100 mg total) by mouth 2 (two) times daily., Disp: 30 capsule, Rfl: 0   fluconazole (DIFLUCAN) 150 MG tablet, Take 1 tablet (150 mg total) by mouth once a week., Disp: 4 tablet, Rfl: 0   hydrOXYzine (VISTARIL) 25 MG capsule, Take 1 capsule (25 mg total) by mouth at bedtime., Disp: 3 capsule, Rfl: 0   ibuprofen (ADVIL) 800 MG tablet, Take 1 tablet (800 mg total) by mouth every 8 (eight) hours as needed., Disp: 30 tablet, Rfl: 0   ketoconazole (NIZORAL) 200 MG tablet, Take 1 tablet (200 mg total) by mouth daily., Disp: 14 tablet, Rfl:  0   mupirocin ointment (BACTROBAN) 2 %, Apply 1 Application topically 2 (two) times daily., Disp: 22 g, Rfl: 0   nystatin cream (MYCOSTATIN), Apply 1 Application topically 2 (two) times daily., Disp: 30 g, Rfl: 0   nystatin-triamcinolone ointment (MYCOLOG), Apply 1 Application topically 2 (two) times daily., Disp: 30 g, Rfl: 0   ondansetron (ZOFRAN) 4 MG tablet, Take 1 tablet (4 mg total) by mouth every 8 (eight) hours as needed for nausea or vomiting., Disp: 20 tablet, Rfl: 0   pantoprazole (PROTONIX) 40 MG tablet, Take 1 tablet (40 mg total) by mouth daily., Disp: 90 tablet, Rfl: 0   triamcinolone cream (KENALOG) 0.1 %, Apply 1 Application topically 2 (two) times daily., Disp: 30 g, Rfl: 0   VRAYLAR 1.5 MG capsule, Take 1.5 mg by mouth daily., Disp: , Rfl:    Medications ordered in this encounter:  Meds ordered this encounter  Medications   brompheniramine-pseudoephedrine-DM 30-2-10 MG/5ML syrup    Sig: Take 5 mLs by mouth 4 (four) times daily as needed.    Dispense:  120 mL    Refill:  0    Order Specific Question:   Supervising Provider    Answer:   Chase Picket [[id number]]     *If you need refills on other medications prior to your next appointment, please contact your pharmacy*  Follow-Up: Call back or seek an in-person evaluation if the symptoms  worsen or if the condition fails to improve as anticipated.  [facility] [phone]  Other Instructions Pharyngitis  Pharyngitis is a sore throat (pharynx). This is when there is redness, pain, and swelling in your throat. Most of the time, this condition gets better on its own. In some cases, you may need medicine. What are the causes? An infection from a virus. An infection from bacteria. Allergies. What increases the risk? Being 37-61 years old. Being in crowded environments. These include: Daycares. Schools. Dormitories. Living in a place with cold temperatures outside. Having a weakened disease-fighting  (immune) system. What are the signs or symptoms? Symptoms may vary depending on the cause. Common symptoms include: Sore throat. Tiredness (fatigue). Low-grade fever. Stuffy nose. Cough. Headache. Other symptoms may include: Glands in the neck (lymph nodes) that are swollen. Skin rashes. Film on the throat or tonsils. This can be caused by an infection from bacteria. Vomiting. Red, itchy eyes. Loss of appetite. Joint pain and muscle aches. Tonsils that are temporarily bigger than usual (enlarged). How is this treated? Many times, treatment is not needed. This condition usually gets better in 3-4 days without treatment. If the infection is caused by a bacteria, you may be need to take antibiotics. Follow these instructions at home: Medicines Take over-the-counter and prescription medicines only as told by your doctor. If you were prescribed an antibiotic medicine, take it as told by your doctor. Do not stop taking the antibiotic even if you start to feel better. Use throat lozenges or sprays to soothe your throat as told by your doctor. Children can get pharyngitis. Do not give your child aspirin. Managing pain To help with pain, try: Sipping warm liquids, such as: Broth. Herbal tea. Warm water. Eating or drinking cold or frozen liquids, such as frozen ice pops. Rinsing your mouth (gargle) with a salt water mixture 3-4 times a day or as needed. To make salt water, dissolve -1 tsp (3-6 g) of salt in 1 cup (237 mL) of warm water. Do not swallow this mixture. Sucking on hard candy or throat lozenges. Putting a cool-mist humidifier in your bedroom at night to moisten the air. Sitting in the bathroom with the door closed for 5-10 minutes while you run hot water in the shower.  General instructions  Do not smoke or use any products that contain nicotine or tobacco. If you need help quitting, ask your doctor. Rest as told by your doctor. Drink enough fluid to keep your pee  (urine) pale yellow. How is this prevented? Wash your hands often for at least 20 seconds with soap and water. If soap and water are not available, use hand sanitizer. Do not touch your eyes, nose, or mouth with unwashed hands. Wash hands after touching these areas. Do not share cups or eating utensils. Avoid close contact with people who are sick. Contact a doctor if: You have large, tender lumps in your neck. You have a rash. You cough up green, yellow-brown, or bloody spit. Get help right away if: You have a stiff neck. You drool or cannot swallow liquids. You cannot drink or take medicines without vomiting. You have very bad pain that does not go away with medicine. You have problems breathing, and it is not from a stuffy nose. You have new pain and swelling in your knees, ankles, wrists, or elbows. These symptoms may be an emergency. Get help right away. Call your local emergency services (911 in the [place] Do not wait to see  if the symptoms will go away. Do not drive yourself to the hospital. Summary Pharyngitis is a sore throat (pharynx). This is when there is redness, pain, and swelling in your throat. Most of the time, pharyngitis gets better on its own. Sometimes, you may need medicine. If you were prescribed an antibiotic medicine, take it as told by your doctor. Do not stop taking the antibiotic even if you start to feel better. This information is not intended to replace advice given to you by your health care provider. Make sure you discuss any questions you have with your health care provider. Document Revised: 02/27/2021 Document Reviewed: 02/27/2021 Elsevier Patient Education  [place].    If you have been instructed to have an in-person evaluation today at a local Urgent Care facility, please use the link below. It will take you to a list of all of our available [facility], including address, phone number and hours of operation. Please do not  delay care.  [facility]  If you or a family member do not have a primary care provider, use the link below to schedule a visit and establish care. When you choose a [facility] primary care physician or advanced practice provider, you gain a long-term partner in health. Find a Primary Care Provider  Learn more about [facility] in-office and virtual care options: [facility]

## 2022-09-17 ENCOUNTER — Telehealth: Payer: 59 | Admitting: Family

## 2022-09-17 DIAGNOSIS — J029 Acute pharyngitis, unspecified: Secondary | ICD-10-CM

## 2022-09-17 MED ORDER — AMOXICILLIN 500 MG PO CAPS: 500.0000 mg | ORAL_CAPSULE | Freq: Two times a day (BID) | ORAL | 0 refills | Status: AC

## 2022-09-17 NOTE — Progress Notes (Signed)
Virtual Visit Consent   [name], you are scheduled for a virtual visit with a [facility] provider today. Just as with appointments in the office, your consent must be obtained to participate. Your consent will be active for this visit and any virtual visit you may have with one of our providers in the next 365 days. If you have a MyChart account, a copy of this consent can be sent to you electronically.  As this is a virtual visit, video technology does not allow for your provider to perform a traditional examination. This may limit your provider's ability to fully assess your condition. If your provider identifies any concerns that need to be evaluated in person or the need to arrange testing (such as labs, EKG, etc.), we will make arrangements to do so. Although advances in technology are sophisticated, we cannot ensure that it will always work on either your end or our end. If the connection with a video visit is poor, the visit may have to be switched to a telephone visit. With either a video or telephone visit, we are not always able to ensure that we have a secure connection.  By engaging in this virtual visit, you consent to the provision of healthcare and authorize for your insurance to be billed (if applicable) for the services provided during this visit. Depending on your insurance coverage, you may receive a charge related to this service.  I need to obtain your verbal consent now. Are you willing to proceed with your visit today? Renesmay Nesbitt [name] has provided verbal consent on 09/17/2022 for a virtual visit (video or telephone). [name], FNP  Date: 09/17/2022 5:37 PM  Virtual Visit via Video Note   I, [name], connected with  [name]  ([id number], May 09, 1986) on 09/17/22 at  5:30 PM EDT by a video-enabled telemedicine application and verified that I am speaking with the correct person using two identifiers.  Location: Patient: Virtual Visit Location  Patient: Home Provider: Virtual Visit Location Provider: Home Office   I discussed the limitations of evaluation and management by telemedicine and the availability of in person appointments. The patient expressed understanding and agreed to proceed.    History of Present Illness: [name] is a 36 y.o. who identifies as a female who was assigned female at birth, and is being seen today for sore throat.  HPI: Sore Throat  This is a new problem. The current episode started in the past 7 days. The problem has been gradually worsening. The pain is worse on the left side. There has been no fever. The pain is at a severity of 8/10. The pain is mild. Associated symptoms include congestion, coughing, swollen glands and trouble swallowing. Pertinent negatives include no ear discharge, ear pain, headaches or shortness of breath. She has had no exposure to strep. She has tried acetaminophen for the symptoms. The treatment provided mild relief.    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      Methylphenidate Derivatives Other (See Comments)    Reaction:  Depression and anger    Neurontin [Gabapentin] Other (See Comments)    Reaction:  Dizziness    Prozac [Fluoxetine Hcl] Other (See Comments)    Reaction:  Anger    Methylphenidate Hcl     SAME AS PREVIOUS LISTED METHYLPHENIDATE   Medications:  Current Outpatient Medications:    amoxicillin (AMOXIL) 500 MG capsule, Take 1 capsule (500 mg total) by mouth 2 (two) times daily for 10 days., Disp: 20 capsule, Rfl: 0   acetaminophen (TYLENOL) 500 MG tablet, Take 2 tablets (1,000 mg total) by mouth every 8 (eight) hours as needed (pain)., Disp: 60 tablet, Rfl: 0   albuterol (VENTOLIN HFA) 108 (90 Base) MCG/ACT inhaler, Inhale 2 puffs into the lungs every 6 (six) hours as needed for wheezing or shortness of breath., Disp: 8 g, Rfl: 2   ARIPiprazole (ABILIFY IM), Inject 300 mg into the muscle every 30 (thirty) days., Disp: , Rfl:    Blood Pressure Monitoring (BLOOD PRESSURE KIT) DEVI, 1 Device by Does not apply route once a week., Disp: 1 each, Rfl: 0   docusate sodium (COLACE) 100 MG capsule, Take 1 capsule (100 mg total) by mouth 2 (two) times daily., Disp: 30 capsule, Rfl: 0   ibuprofen (ADVIL) 800 MG tablet, Take 1 tablet (800 mg total) by mouth every 8 (eight) hours as needed., Disp: 30 tablet, Rfl: 0   mupirocin ointment (BACTROBAN) 2 %, Apply 1 Application topically 2 (two) times daily., Disp: 22 g, Rfl: 0   nystatin cream (MYCOSTATIN), Apply 1 Application topically 2 (two) times daily., Disp: 30 g, Rfl: 0   nystatin-triamcinolone ointment (MYCOLOG), Apply 1 Application topically 2 (two) times daily., Disp: 30 g, Rfl: 0   ondansetron (ZOFRAN) 4 MG tablet, Take 1 tablet (4 mg total) by mouth every 8 (eight) hours as needed for nausea or vomiting., Disp: 20 tablet, Rfl: 0   VRAYLAR 1.5 MG capsule, Take 1.5 mg by mouth daily., Disp: , Rfl:   Observations/Objective: Patient is well-developed, well-nourished in no acute distress.   Resting comfortably  at home.  Head is normocephalic, atraumatic.  No labored breathing.  Speech is clear and coherent with logical content.  Patient is alert and oriented at baseline.  Tonsils erythemas and swollen   Assessment and Plan: 1. Acute pharyngitis, unspecified etiology - amoxicillin (AMOXIL) 500 MG capsule; Take 1 capsule (500 mg total) by mouth 2 (two) times daily for 10 days.  Dispense: 20 capsule; Refill: 0  - Take meds as prescribed - Use a cool mist humidifier  -Use saline nose sprays frequently -Force fluids -For any cough or congestion  Use plain Mucinex- regular strength or max strength is fine -For fever or aces or pains- take tylenol or ibuprofen. -Throat lozenges if help -New toothbrush in 3 days -Follow up if symptoms worsen or do not improve    Follow Up Instructions: I discussed the assessment and treatment plan with the patient. The patient was provided an opportunity to ask questions and all were answered. The patient agreed with the plan and demonstrated an understanding of the instructions.  A copy of instructions were sent to the patient via MyChart unless otherwise noted below.     The patient was advised to call back or seek an in-person evaluation if the symptoms worsen or if the condition fails to improve as anticipated.  Time:  I spent 7 minutes with the patient via telehealth technology discussing the above problems/concerns.    [name], FNP

## 2022-09-18 ENCOUNTER — Other Ambulatory Visit: Payer: Self-pay

## 2022-09-18 ENCOUNTER — Emergency Department (HOSPITAL_BASED_OUTPATIENT_CLINIC_OR_DEPARTMENT_OTHER)
Admission: EM | Admit: 2022-09-18 | Discharge: 2022-09-18 | Disposition: A | Discharge status: Home / Self Care | Payer: 59 | Attending: Emergency Medicine | Admitting: Emergency Medicine

## 2022-09-18 ENCOUNTER — Encounter (HOSPITAL_BASED_OUTPATIENT_CLINIC_OR_DEPARTMENT_OTHER): Payer: Self-pay | Admitting: Emergency Medicine

## 2022-09-18 DIAGNOSIS — J45909 Unspecified asthma, uncomplicated: Secondary | ICD-10-CM | POA: Diagnosis not present

## 2022-09-18 DIAGNOSIS — Z7951 Long term (current) use of inhaled steroids: Secondary | ICD-10-CM | POA: Insufficient documentation

## 2022-09-18 DIAGNOSIS — J069 Acute upper respiratory infection, unspecified: Secondary | ICD-10-CM

## 2022-09-18 DIAGNOSIS — Z20822 Contact with and (suspected) exposure to covid-19: Secondary | ICD-10-CM | POA: Insufficient documentation

## 2022-09-18 DIAGNOSIS — R059 Cough, unspecified: Secondary | ICD-10-CM | POA: Diagnosis present

## 2022-09-18 LAB — RESP PANEL BY RT-PCR (FLU A&B, COVID) ARPGX2
Influenza A by PCR: NEGATIVE
Influenza B by PCR: NEGATIVE
SARS Coronavirus 2 by RT PCR: NEGATIVE

## 2022-09-18 LAB — GROUP A STREP BY PCR: Group A Strep by PCR: NOT DETECTED

## 2022-09-18 MED ORDER — LIDOCAINE VISCOUS HCL 2 % MT SOLN: 15.0000 mL | Freq: Four times a day (QID) | OROMUCOSAL | 0 refills | Status: DC | PRN

## 2022-09-18 NOTE — ED Triage Notes (Signed)
Pt arrives to ED with c/o cough x1 week and sore throat x2 days. Has tried OTC medication w/o relief.

## 2022-09-18 NOTE — Discharge Instructions (Signed)
If you develop high fever, severe cough or cough with blood, trouble breathing, severe headache, neck pain/stiffness, vomiting, or any other new/concerning symptoms then return to the ER for evaluation  

## 2022-09-18 NOTE — ED Provider Notes (Signed)
[facility] EMERGENCY DEPT Provider Note   CSN: [id number] Arrival date & time: 09/18/22  0840     History  Chief Complaint  Patient presents with   Cough    sor   Sore Throat    [name] is a 36 y.o. female.  HPI 36 year old female presents with cough and sore throat.  Started with a cough about a week ago and now is turned into a sore throat over the last few days.  No fevers or dyspnea.  Her son recently had similar symptoms that she thinks he got from school and now is over.  Patient states that nothing seems to help when she gets symptoms like this except cough medicine with codeine.  She is having pain with swallowing.  She has a history of asthma. Feels mildly short of breath.  Home Medications Prior to Admission medications   Medication Sig Start Date End Date Taking? Authorizing Provider  lidocaine (XYLOCAINE) 2 % solution Use as directed 15 mLs in the mouth or throat every 6 (six) hours as needed for mouth pain. 09/18/22  Yes Sherwood Gambler, MD  acetaminophen (TYLENOL) 500 MG tablet Take 2 tablets (1,000 mg total) by mouth every 8 (eight) hours as needed (pain). 01/29/22   Genia Del, MD  albuterol (VENTOLIN HFA) 108 (90 Base) MCG/ACT inhaler Inhale 2 puffs into the lungs every 6 (six) hours as needed for wheezing or shortness of breath. 12/19/21   Chancy Milroy, MD  ALPRAZolam Duanne Moron) 1 MG tablet Take 1 mg by mouth daily as needed. 09/10/22   [provider]  amoxicillin (AMOXIL) 500 MG capsule Take 1 capsule (500 mg total) by mouth 2 (two) times daily for 10 days. 09/17/22 09/27/22  [name] A, FNP  ARIPiprazole (ABILIFY IM) Inject 300 mg into the muscle every 30 (thirty) days.    [provider]  Blood Pressure Monitoring (BLOOD PRESSURE KIT) DEVI 1 Device by Does not apply route once a week. 09/30/21   Starr Lake, CNM  docusate sodium (COLACE) 100 MG capsule Take 1 capsule (100 mg total) by mouth 2  (two) times daily. 04/01/22   Aletha Halim, MD  ibuprofen (ADVIL) 800 MG tablet Take 1 tablet (800 mg total) by mouth every 8 (eight) hours as needed. 06/11/22   Mar Daring, PA-C  mupirocin ointment (BACTROBAN) 2 % Apply 1 Application topically 2 (two) times daily. 08/24/22   Mar Daring, PA-C  nystatin cream (MYCOSTATIN) Apply 1 Application topically 2 (two) times daily. 08/24/22   Leath-Warren, Alda Lea, NP  nystatin-triamcinolone ointment (MYCOLOG) Apply 1 Application topically 2 (two) times daily. 08/24/22   Mar Daring, PA-C  ondansetron (ZOFRAN) 4 MG tablet Take 1 tablet (4 mg total) by mouth every 8 (eight) hours as needed for nausea or vomiting. 06/02/22   Mar Daring, PA-C  VRAYLAR 1.5 MG capsule Take 1.5 mg by mouth daily. 04/03/22   [provider]      Allergies    Depakote [divalproex sodium], Methylphenidate derivatives, Neurontin [gabapentin], Prozac [fluoxetine hcl], and Methylphenidate hcl    Review of Systems   Review of Systems  Constitutional:  Negative for fever.  HENT:  Positive for sore throat.   Respiratory:  Positive for cough.     Physical Exam Updated Vital Signs BP (!) 134/94 (BP Location: Right Arm)   Pulse (!) 102   Temp 98.4 F (36.9 C) (Oral)   Resp 18   Ht 5' 3"  (1.6  m)   Wt 103.4 kg   SpO2 99%   BMI 40.39 kg/m  Physical Exam Vitals and nursing note reviewed.  Constitutional:      Appearance: She is well-developed.  HENT:     Head: Normocephalic and atraumatic.     Mouth/Throat:     Mouth: No oral lesions.     Pharynx: Uvula midline. No pharyngeal swelling, oropharyngeal exudate, posterior oropharyngeal erythema or uvula swelling.     Tonsils: No tonsillar abscesses.  Cardiovascular:     Rate and Rhythm: Normal rate and regular rhythm.     Heart sounds: Normal heart sounds.  Pulmonary:     Effort: Pulmonary effort is normal.     Breath sounds: Normal breath sounds. No wheezing.  Abdominal:      General: There is no distension.  Skin:    General: Skin is warm and dry.  Neurological:     Mental Status: She is alert.     ED Results / Procedures / Treatments   Labs (all labs ordered are listed, but only abnormal results are displayed) Labs Reviewed  GROUP A STREP BY PCR  RESP PANEL BY RT-PCR (FLU A&B, COVID) [id number]    EKG None  Radiology No results found.  Procedures Procedures    Medications Ordered in ED Medications - No data to display  ED Course/ Medical Decision Making/ A&P                           Medical Decision Making Risk Prescription drug management.   COVID, flu, strep test were all obtained in triage and are all negative.  Highly doubt deep space neck infection.  Given multiple family members checking in all at the same time with similar symptoms as well as son having had URI symptoms last week, this seems most consistent with a URI.  Highly doubt pneumonia.  I do not think x-ray is warranted.  Clear lungs on exam.  Discussed supportive care.  I do not think narcotic cough syrup is warranted though I can prescribe some viscous lidocaine to see if it helps with her sore throat.  Otherwise use over-the-counter medicines.  Discharged home with return precautions.        Final Clinical Impression(s) / ED Diagnoses Final diagnoses:  Upper respiratory tract infection, unspecified type    Rx / DC Orders ED Discharge Orders          Ordered    lidocaine (XYLOCAINE) 2 % solution  Every 6 hours PRN        09/18/22 1002              Sherwood Gambler, MD 09/18/22 1006

## 2022-09-22 ENCOUNTER — Telehealth: Payer: 59 | Admitting: Family

## 2022-09-22 DIAGNOSIS — J069 Acute upper respiratory infection, unspecified: Secondary | ICD-10-CM | POA: Diagnosis not present

## 2022-09-22 MED ORDER — PREDNISONE 10 MG (21) PO TBPK: ORAL_TABLET | ORAL | 0 refills | Status: DC

## 2022-09-22 MED ORDER — BENZONATATE 100 MG PO CAPS: 100.0000 mg | ORAL_CAPSULE | Freq: Three times a day (TID) | ORAL | 0 refills | Status: DC | PRN

## 2022-09-22 NOTE — Patient Instructions (Signed)

## 2022-09-22 NOTE — Progress Notes (Signed)
Virtual Visit Consent   [name], you are scheduled for a virtual visit with a [facility] provider today. Just as with appointments in the office, your consent must be obtained to participate. Your consent will be active for this visit and any virtual visit you may have with one of our providers in the next 365 days. If you have a MyChart account, a copy of this consent can be sent to you electronically.  As this is a virtual visit, video technology does not allow for your provider to perform a traditional examination. This may limit your provider's ability to fully assess your condition. If your provider identifies any concerns that need to be evaluated in person or the need to arrange testing (such as labs, EKG, etc.), we will make arrangements to do so. Although advances in technology are sophisticated, we cannot ensure that it will always work on either your end or our end. If the connection with a video visit is poor, the visit may have to be switched to a telephone visit. With either a video or telephone visit, we are not always able to ensure that we have a secure connection.  By engaging in this virtual visit, you consent to the provision of healthcare and authorize for your insurance to be billed (if applicable) for the services provided during this visit. Depending on your insurance coverage, you may receive a charge related to this service.  I need to obtain your verbal consent now. Are you willing to proceed with your visit today? [name] has provided verbal consent on 09/22/2022 for a virtual visit (video or telephone). [name], FNP  Date: 09/22/2022 6:02 PM  Virtual Visit via Video Note   I, [name], connected with  [name]  ([id number], 07/27/1986) on 09/22/22 at  6:00 PM EDT by a video-enabled telemedicine application and verified that I am speaking with the correct person using two identifiers.  Location: Patient: Virtual Visit Location  Patient: Home Provider: Virtual Visit Location Provider: Home Office   I discussed the limitations of evaluation and management by telemedicine and the availability of in person appointments. The patient expressed understanding and agreed to proceed.    History of Present Illness: [name] is a 36 y.o. who identifies as a female who was assigned female at birth, and is being seen today for cold. She reports she has been coughing until she throws up.   She has a video visit on 09/17/22 and was given Amoxicillin 500 mg. She was then seen in the ED on 09/18/22 and had a negative strep test and told to stop this.   HPI: Cough This is a new problem. The current episode started 1 to 4 weeks ago. The problem has been gradually worsening. The problem occurs every few minutes. The cough is Non-productive. Associated symptoms include nasal congestion and a sore throat. Pertinent negatives include no chills, ear congestion, ear pain, fever, headaches, myalgias, shortness of breath or wheezing.    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     Methylphenidate Derivatives Other (See Comments)    Reaction:  Depression and anger    Neurontin [Gabapentin] Other (See Comments)    Reaction:  Dizziness    Prozac [Fluoxetine Hcl] Other (See Comments)    Reaction:  Anger    Methylphenidate Hcl     SAME AS PREVIOUS LISTED METHYLPHENIDATE   Medications:  Current Outpatient Medications:    benzonatate (TESSALON PERLES) 100 MG capsule, Take 1 capsule (100 mg total) by mouth 3 (three) times daily as needed., Disp: 20 capsule, Rfl: 0   predniSONE (STERAPRED UNI-PAK 21 TAB) 10 MG (21) TBPK tablet, Use as directed, Disp: 21 tablet, Rfl: 0   acetaminophen (TYLENOL) 500 MG tablet, Take 2 tablets (1,000 mg total) by mouth every 8 (eight) hours as needed (pain)., Disp: 60 tablet, Rfl: 0   albuterol (VENTOLIN HFA) 108 (90 Base) MCG/ACT inhaler, Inhale 2 puffs into the lungs every 6 (six) hours as needed for wheezing or shortness of breath., Disp: 8 g, Rfl: 2   ALPRAZolam (XANAX) 1 MG tablet, Take 1 mg by mouth daily as needed., Disp: , Rfl:    amoxicillin (AMOXIL) 500 MG capsule, Take 1 capsule (500 mg total) by mouth 2 (two) times daily for 10 days., Disp: 20 capsule, Rfl: 0   ARIPiprazole (ABILIFY IM), Inject 300 mg into the muscle every 30 (thirty) days., Disp: , Rfl:    Blood Pressure Monitoring (BLOOD PRESSURE KIT) DEVI, 1 Device by Does not apply route once a week., Disp: 1 each, Rfl: 0   docusate sodium (COLACE) 100 MG capsule, Take 1 capsule (100 mg total) by mouth 2 (two) times daily., Disp: 30 capsule, Rfl: 0   ibuprofen (ADVIL) 800 MG tablet, Take 1 tablet (800 mg total) by mouth every 8 (eight) hours as needed., Disp: 30 tablet, Rfl: 0   lidocaine (XYLOCAINE) 2 % solution, Use as directed 15 mLs in the mouth or throat every 6 (six) hours as needed for mouth pain., Disp: 100 mL, Rfl: 0   mupirocin ointment (BACTROBAN) 2 %, Apply 1 Application topically 2 (two) times daily., Disp: 22 g, Rfl: 0    nystatin cream (MYCOSTATIN), Apply 1 Application topically 2 (two) times daily., Disp: 30 g, Rfl: 0   nystatin-triamcinolone ointment (MYCOLOG), Apply 1 Application topically 2 (two) times daily., Disp: 30 g, Rfl: 0   ondansetron (ZOFRAN) 4 MG tablet, Take 1 tablet (4 mg total) by mouth every 8 (eight) hours as needed for nausea or vomiting., Disp: 20 tablet, Rfl: 0   VRAYLAR 1.5 MG capsule, Take 1.5 mg by mouth daily., Disp: , Rfl:   Observations/Objective: Patient is well-developed, well-nourished in no acute distress.  Resting comfortably  at home.  Head is normocephalic, atraumatic.  No labored breathing.  Speech is clear and coherent with logical content.  Patient is alert and oriented at baseline.  Nasal congestion   Assessment and Plan: 1. Viral URI with cough - benzonatate (TESSALON PERLES) 100 MG capsule; Take 1 capsule (100 mg total) by mouth 3 (three) times daily as needed.  Dispense: 20 capsule; Refill: 0 - predniSONE (  STERAPRED UNI-PAK 21 TAB) 10 MG (21) TBPK tablet; Use as directed  Dispense: 21 tablet; Refill: 0  - Take meds as prescribed - Use a cool mist humidifier  -Use saline nose sprays frequently -Force fluids -For any cough or congestion  Use plain Mucinex- regular strength or max strength is fine -For fever or aces or pains- take tylenol or ibuprofen. -Throat lozenges if help -Follow up if symptoms worsen or do not improve   Follow Up Instructions: I discussed the assessment and treatment plan with the patient. The patient was provided an opportunity to ask questions and all were answered. The patient agreed with the plan and demonstrated an understanding of the instructions.  A copy of instructions were sent to the patient via MyChart unless otherwise noted below.    The patient was advised to call back or seek an in-person evaluation if the symptoms worsen or if the condition fails to improve as anticipated.  Time:  I spent 8 minutes with the patient via  telehealth technology discussing the above problems/concerns.    [name], FNP

## 2022-11-03 ENCOUNTER — Telehealth: Payer: 59 | Admitting: Family

## 2022-11-03 DIAGNOSIS — J4520 Mild intermittent asthma, uncomplicated: Secondary | ICD-10-CM | POA: Diagnosis not present

## 2022-11-03 MED ORDER — FLUTICASONE PROPIONATE 50 MCG/ACT NA SUSP: 2.0000 | Freq: Every day | NASAL | 6 refills | Status: DC

## 2022-11-03 MED ORDER — UMECLIDINIUM-VILANTEROL 62.5-25 MCG/ACT IN AEPB: 1.0000 | INHALATION_SPRAY | Freq: Every day | RESPIRATORY_TRACT | 0 refills | Status: DC

## 2022-11-03 MED ORDER — PREDNISONE 20 MG PO TABS: 40.0000 mg | ORAL_TABLET | Freq: Every day | ORAL | 0 refills | Status: DC

## 2022-11-03 MED ORDER — MONTELUKAST SODIUM 10 MG PO TABS: 10.0000 mg | ORAL_TABLET | Freq: Every day | ORAL | 3 refills | Status: DC

## 2022-11-03 NOTE — Progress Notes (Signed)
Virtual Visit Consent   [name], you are scheduled for a virtual visit with a [facility] provider today. Just as with appointments in the office, your consent must be obtained to participate. Your consent will be active for this visit and any virtual visit you may have with one of our providers in the next 365 days. If you have a MyChart account, a copy of this consent can be sent to you electronically.  As this is a virtual visit, video technology does not allow for your provider to perform a traditional examination. This may limit your provider's ability to fully assess your condition. If your provider identifies any concerns that need to be evaluated in person or the need to arrange testing (such as labs, EKG, etc.), we will make arrangements to do so. Although advances in technology are sophisticated, we cannot ensure that it will always work on either your end or our end. If the connection with a video visit is poor, the visit may have to be switched to a telephone visit. With either a video or telephone visit, we are not always able to ensure that we have a secure connection.  By engaging in this virtual visit, you consent to the provision of healthcare and authorize for your insurance to be billed (if applicable) for the services provided during this visit. Depending on your insurance coverage, you may receive a charge related to this service.  I need to obtain your verbal consent now. Are you willing to proceed with your visit today? [name] has provided verbal consent on 11/03/2022 for a virtual visit (video or telephone). [name], FNP  Date: 11/03/2022 7:07 PM  Virtual Visit via Video Note   I, [name], connected with  [name]  ([id number], 09/21/1986) on 11/03/22 at  7:15 PM EST by a video-enabled telemedicine application and verified that I am speaking with the correct person using two identifiers.  Location: Patient: Virtual Visit Location  Patient: Home Provider: Virtual Visit Location Provider: Home Office   I discussed the limitations of evaluation and management by telemedicine and the availability of in person appointments. The patient expressed understanding and agreed to proceed.    History of Present Illness: [name] is a 36 y.o. who identifies as a female who was assigned female at birth, and is being seen today for asthmas flare up.  HPI: Asthma She complains of cough, shortness of breath and wheezing. There is no chest tightness or frequent throat clearing. This is a new problem. The current episode started today. The cough is productive of sputum. Associated symptoms include dyspnea on exertion. Pertinent negatives include no chest pain, ear congestion, ear pain, fever, nasal congestion, rhinorrhea, sneezing, sore throat, sweats or trouble swallowing. Her symptoms are aggravated by exercise. Her symptoms are alleviated by rest (albuterol). Her symptoms are not alleviated by rest. Her past medical history is significant for asthma.    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     Methylphenidate Derivatives Other (See Comments)    Reaction:  Depression and anger    Neurontin [Gabapentin] Other (See Comments)    Reaction:  Dizziness    Prozac [Fluoxetine Hcl] Other (See Comments)    Reaction:  Anger    Methylphenidate Hcl     SAME AS PREVIOUS LISTED METHYLPHENIDATE   Medications:  Current Outpatient Medications:    fluticasone (FLONASE) 50 MCG/ACT nasal spray, Place 2 sprays into both nostrils daily., Disp: 16 g, Rfl: 6   montelukast (SINGULAIR) 10 MG tablet, Take 1 tablet (10 mg total) by mouth at bedtime., Disp: 30 tablet, Rfl: 3   predniSONE (DELTASONE) 20 MG tablet, Take 2 tablets (40 mg total) by mouth daily with breakfast for 5 days., Disp: 10 tablet, Rfl: 0   umeclidinium-vilanterol (ANORO ELLIPTA) 62.5-25 MCG/ACT AEPB, Inhale 1 puff into the lungs daily at 6 (six) AM., Disp: 1 each, Rfl: 0   acetaminophen (TYLENOL) 500 MG tablet, Take 2 tablets (1,000 mg total) by mouth every 8 (eight) hours as needed (pain)., Disp: 60 tablet, Rfl: 0   albuterol (VENTOLIN HFA) 108 (90 Base) MCG/ACT inhaler, Inhale 2 puffs into the lungs every 6 (six) hours as needed for wheezing or shortness of breath., Disp: 8 g, Rfl: 2   ALPRAZolam (XANAX) 1 MG tablet, Take 1 mg by mouth daily as needed., Disp: , Rfl:    ARIPiprazole (ABILIFY IM), Inject 300 mg into the muscle every 30 (thirty) days., Disp: , Rfl:    Blood Pressure Monitoring (BLOOD PRESSURE KIT) DEVI, 1 Device by Does not apply route once a week., Disp: 1 each, Rfl: 0   docusate sodium (COLACE) 100 MG capsule, Take 1 capsule (100 mg total) by mouth 2 (two) times daily., Disp: 30 capsule, Rfl: 0   ibuprofen (ADVIL) 800 MG tablet, Take 1 tablet (800 mg total) by mouth every 8 (eight) hours as needed., Disp: 30 tablet, Rfl: 0   VRAYLAR 1.5 MG capsule, Take 1.5 mg by mouth daily., Disp: , Rfl:   Observations/Objective: Patient is well-developed, well-nourished in no  acute distress.  Resting comfortably  at home.  Head is normocephalic, atraumatic.  No labored breathing.  Speech is clear and coherent with logical content.  Patient is alert and oriented at baseline.    Assessment and Plan: 1. Mild intermittent asthma, unspecified whether complicated - montelukast (SINGULAIR) 10 MG tablet; Take 1 tablet (10 mg total) by mouth at bedtime.  Dispense: 30 tablet; Refill: 3 - umeclidinium-vilanterol (ANORO ELLIPTA) 62.5-25 MCG/ACT AEPB; Inhale 1 puff into the lungs daily at 6 (six) AM.  Dispense: 1 each; Refill: 0 - fluticasone (FLONASE) 50 MCG/ACT nasal spray; Place 2 sprays into both nostrils daily.  Dispense: 16 g; Refill: 6 - predniSONE (DELTASONE) 20 MG tablet; Take 2 tablets (40 mg total) by mouth daily with breakfast for 5 days.  Dispense: 10 tablet; Refill: 0  Continue albuterol  Start singulair, Anoro, and flonase I will give prednisone today, but needs to follow up with PCP for management as this is not controlled    Follow Up Instructions: I discussed the assessment and treatment plan with the patient. The  patient was provided an opportunity to ask questions and all were answered. The patient agreed with the plan and demonstrated an understanding of the instructions.  A copy of instructions were sent to the patient via MyChart unless otherwise noted below.     The patient was advised to call back or seek an in-person evaluation if the symptoms worsen or if the condition fails to improve as anticipated.  Time:  I spent 12 minutes with the patient via telehealth technology discussing the above problems/concerns.    [name], FNP

## 2022-11-04 ENCOUNTER — Telehealth: Payer: 59 | Admitting: Physician Assistant

## 2022-11-04 DIAGNOSIS — B9689 Other specified bacterial agents as the cause of diseases classified elsewhere: Secondary | ICD-10-CM | POA: Diagnosis not present

## 2022-11-04 DIAGNOSIS — J208 Acute bronchitis due to other specified organisms: Secondary | ICD-10-CM

## 2022-11-04 MED ORDER — PREDNISONE 20 MG PO TABS: 40.0000 mg | ORAL_TABLET | Freq: Every day | ORAL | 0 refills | Status: DC

## 2022-11-04 MED ORDER — DOXYCYCLINE HYCLATE 100 MG PO TABS: 100.0000 mg | ORAL_TABLET | Freq: Two times a day (BID) | ORAL | 0 refills | Status: DC

## 2022-11-04 NOTE — Patient Instructions (Signed)
[name], thank you for joining  [facility] , PA-C for today's virtual visit.  While this provider is not your primary care provider (PCP), if your PCP is located in our provider database this encounter information will be shared with them immediately following your visit.   A [facility] MyChart account gives you access to today's visit and all your visits, tests, and labs performed at [facility] " click here if you don't have a [facility] MyChart account or go to [email or web address]  Consent: (Patient) [name] provided verbal consent for this virtual visit at the beginning of the encounter.  Current Medications:  Current Outpatient Medications:    doxycycline (VIBRA-TABS) 100 MG tablet, Take 1 tablet (100 mg total) by mouth 2 (two) times daily., Disp: 14 tablet, Rfl: 0   predniSONE (DELTASONE) 20 MG tablet, Take 2 tablets (40 mg total) by mouth daily with breakfast., Disp: 10 tablet, Rfl: 0   acetaminophen (TYLENOL) 500 MG tablet, Take 2 tablets (1,000 mg total) by mouth every 8 (eight) hours as needed (pain)., Disp: 60 tablet, Rfl: 0   albuterol (VENTOLIN HFA) 108 (90 Base) MCG/ACT inhaler, Inhale 2 puffs into the lungs every 6 (six) hours as needed for wheezing or shortness of breath., Disp: 8 g, Rfl: 2   ALPRAZolam (XANAX) 1 MG tablet, Take 1 mg by mouth daily as needed., Disp: , Rfl:    ARIPiprazole (ABILIFY IM), Inject 300 mg into the muscle every 30 (thirty) days., Disp: , Rfl:    Blood Pressure Monitoring (BLOOD PRESSURE KIT) DEVI, 1 Device by Does not apply route once a week., Disp: 1 each, Rfl: 0   docusate sodium (COLACE) 100 MG capsule, Take 1 capsule (100 mg total) by mouth 2 (two) times daily., Disp: 30 capsule, Rfl: 0   fluticasone (FLONASE) 50 MCG/ACT nasal spray, Place 2 sprays into both nostrils daily., Disp: 16 g, Rfl: 6   ibuprofen (ADVIL) 800 MG tablet, Take 1 tablet (800 mg total) by mouth every 8 (eight) hours as needed.,  Disp: 30 tablet, Rfl: 0   montelukast (SINGULAIR) 10 MG tablet, Take 1 tablet (10 mg total) by mouth at bedtime., Disp: 30 tablet, Rfl: 3   umeclidinium-vilanterol (ANORO ELLIPTA) 62.5-25 MCG/ACT AEPB, Inhale 1 puff into the lungs daily at 6 (six) AM., Disp: 1 each, Rfl: 0   VRAYLAR 1.5 MG capsule, Take 1.5 mg by mouth daily., Disp: , Rfl:    Medications ordered in this encounter:  Meds ordered this encounter  Medications   predniSONE (DELTASONE) 20 MG tablet    Sig: Take 2 tablets (40 mg total) by mouth daily with breakfast.    Dispense:  10 tablet    Refill:  0    Lost rx filled yesterday    Order Specific Question:   Supervising Provider    Answer:   LAMPTEY, PHILIP O [[id number]]   doxycycline (VIBRA-TABS) 100 MG tablet    Sig: Take 1 tablet (100 mg total) by mouth 2 (two) times daily.    Dispense:  14 tablet    Refill:  0    Order Specific Question:   Supervising Provider    Answer:   LAMPTEY, PHILIP O [[id number]]     *If you need refills on other medications prior to your next appointment, please contact your pharmacy*  Follow-Up: Call back or seek an in-person evaluation if the symptoms worsen or if the condition fails to improve as anticipated.  [facility] [phone]  Other   Instructions Take antibiotic (Doxycycline) as directed.  Increase fluids.  Get plenty of rest. Use Mucinex for congestion. Prednisone as directed. Take a daily probiotic (I recommend Align or Culturelle, but even Activia Yogurt may be beneficial).  A humidifier placed in the bedroom may offer some relief for a dry, scratchy throat of nasal irritation.  Read information below on acute bronchitis. Please call or return to clinic if symptoms are not improving.  Acute Bronchitis Bronchitis is when the airways that extend from the windpipe into the lungs get red, puffy, and painful (inflamed). Bronchitis often causes thick spit (mucus) to develop. This leads to a cough. A cough is the most common  symptom of bronchitis. In acute bronchitis, the condition usually begins suddenly and goes away over time (usually in 2 weeks). Smoking, allergies, and asthma can make bronchitis worse. Repeated episodes of bronchitis may cause more lung problems.  HOME CARE Rest. Drink enough fluids to keep your pee (urine) clear or pale yellow (unless you need to limit fluids as told by your doctor). Only take over-the-counter or prescription medicines as told by your doctor. Avoid smoking and secondhand smoke. These can make bronchitis worse. If you are a smoker, think about using nicotine gum or skin patches. Quitting smoking will help your lungs heal faster. Reduce the chance of getting bronchitis again by: Washing your hands often. Avoiding people with cold symptoms. Trying not to touch your hands to your mouth, nose, or eyes. Follow up with your doctor as told.  GET HELP IF: Your symptoms do not improve after 1 week of treatment. Symptoms include: Cough. Fever. Coughing up thick spit. Body aches. Chest congestion. Chills. Shortness of breath. Sore throat.  GET HELP RIGHT AWAY IF:  You have an increased fever. You have chills. You have severe shortness of breath. You have bloody thick spit (sputum). You throw up (vomit) often. You lose too much body fluid (dehydration). You have a severe headache. You faint.  MAKE SURE YOU:  Understand these instructions. Will watch your condition. Will get help right away if you are not doing well or get worse. Document Released: 05/19/2008 Document Revised: 08/03/2013 Document Reviewed: 05/24/2013 ExitCare Patient Information 2015 [employer]. This information is not intended to replace advice given to you by your health care provider. Make sure you discuss any questions you have with your health care provider.    If you have been instructed to have an in-person evaluation today at a local Urgent Care facility, please use the link below. It  will take you to a list of all of our available [facility], including address, phone number and hours of operation. Please do not delay care.  [facility]  If you or a family member do not have a primary care provider, use the link below to schedule a visit and establish care. When you choose a [facility] primary care physician or advanced practice provider, you gain a long-term partner in health. Find a Primary Care Provider  Learn more about [facility] in-office and virtual care options: [facility] - Get Care Now  

## 2022-11-04 NOTE — Progress Notes (Deleted)
Virtual Visit Consent   [name], you are scheduled for a virtual visit with a [facility] provider today. Just as with appointments in the office, your consent must be obtained to participate. Your consent will be active for this visit and any virtual visit you may have with one of our providers in the next 365 days. If you have a MyChart account, a copy of this consent can be sent to you electronically.  As this is a virtual visit, video technology does not allow for your provider to perform a traditional examination. This may limit your provider's ability to fully assess your condition. If your provider identifies any concerns that need to be evaluated in person or the need to arrange testing (such as labs, EKG, etc.), we will make arrangements to do so. Although advances in technology are sophisticated, we cannot ensure that it will always work on either your end or our end. If the connection with a video visit is poor, the visit may have to be switched to a telephone visit. With either a video or telephone visit, we are not always able to ensure that we have a secure connection.  By engaging in this virtual visit, you consent to the provision of healthcare and authorize for your insurance to be billed (if applicable) for the services provided during this visit. Depending on your insurance coverage, you may receive a charge related to this service.  I need to obtain your verbal consent now. Are you willing to proceed with your visit today? [name] has provided verbal consent on 11/04/2022 for a virtual visit (video or telephone). [name], [place]  Date: 11/04/2022 4:15 PM  Virtual Visit via Video Note   I, [name], connected with  [name]  ([id number], 30-Dec-1985) on 11/04/22 at  4:15 PM EST by a video-enabled telemedicine application and verified that I am speaking with the correct person using two identifiers.  Location: Patient: Virtual  Visit Location Patient: Home Provider: Virtual Visit Location Provider: Home Office   I discussed the limitations of evaluation and management by telemedicine and the availability of in person appointments. The patient expressed understanding and agreed to proceed.    History of Present Illness: [name] is a 36 y.o. who identifies as a female who was assigned female at birth, and is being seen today for ***.  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     Methylphenidate Derivatives Other (See Comments)    Reaction:  Depression and anger    Neurontin [Gabapentin] Other (See Comments)    Reaction:  Dizziness  Prozac [Fluoxetine Hcl] Other (See Comments)    Reaction:  Anger    Methylphenidate Hcl     SAME AS PREVIOUS LISTED METHYLPHENIDATE   Medications:  Current Outpatient Medications:    acetaminophen (TYLENOL) 500 MG tablet, Take 2 tablets (1,000 mg total) by mouth every 8 (eight) hours as needed (pain)., Disp: 60 tablet, Rfl: 0   albuterol (VENTOLIN HFA) 108 (90 Base)  MCG/ACT inhaler, Inhale 2 puffs into the lungs every 6 (six) hours as needed for wheezing or shortness of breath., Disp: 8 g, Rfl: 2   ALPRAZolam (XANAX) 1 MG tablet, Take 1 mg by mouth daily as needed., Disp: , Rfl:    ARIPiprazole (ABILIFY IM), Inject 300 mg into the muscle every 30 (thirty) days., Disp: , Rfl:    Blood Pressure Monitoring (BLOOD PRESSURE KIT) DEVI, 1 Device by Does not apply route once a week., Disp: 1 each, Rfl: 0   docusate sodium (COLACE) 100 MG capsule, Take 1 capsule (100 mg total) by mouth 2 (two) times daily., Disp: 30 capsule, Rfl: 0   fluticasone (FLONASE) 50 MCG/ACT nasal spray, Place 2 sprays into both nostrils daily., Disp: 16 g, Rfl: 6   ibuprofen (ADVIL) 800 MG tablet, Take 1 tablet (800 mg total) by mouth every 8 (eight) hours as needed., Disp: 30 tablet, Rfl: 0   montelukast (SINGULAIR) 10 MG tablet, Take 1 tablet (10 mg total) by mouth at bedtime., Disp: 30 tablet, Rfl: 3   predniSONE (DELTASONE) 20 MG tablet, Take 2 tablets (40 mg total) by mouth daily with breakfast for 5 days., Disp: 10 tablet, Rfl: 0   umeclidinium-vilanterol (ANORO ELLIPTA) 62.5-25 MCG/ACT AEPB, Inhale 1 puff into the lungs daily at 6 (six) AM., Disp: 1 each, Rfl: 0   VRAYLAR 1.5 MG capsule, Take 1.5 mg by mouth daily., Disp: , Rfl:   Observations/Objective: Patient is well-developed, well-nourished in no acute distress.  Resting comfortably *** at home.  Head is normocephalic, atraumatic.  No labored breathing. *** Speech is clear and coherent with logical content.  Patient is alert and oriented at baseline.  ***  Assessment and Plan: There are no diagnoses linked to this encounter. ***  Follow Up Instructions: I discussed the assessment and treatment plan with the patient. The patient was provided an opportunity to ask questions and all were answered. The patient agreed with the plan and demonstrated an understanding of the instructions.  A copy of instructions were sent to the  patient via MyChart unless otherwise noted below.   {EMAIL [id number]::"[email or web address] has requested to receive PHI (AVS, Work Notes, etc) pertaining to this video visit through e-mail as they are currently without active MyChart. They have voiced understand that email is not considered secure and their health information could be viewed by someone other than the patient. "}  The patient was advised to call back or seek an in-person evaluation if the symptoms worsen or if the condition fails to improve as anticipated.  Time:  I spent *** minutes with the patient via telehealth technology discussing the above problems/concerns.    Nichole Rio, PA-C

## 2022-11-04 NOTE — Progress Notes (Signed)
Virtual Visit Consent   [name], you are scheduled for a virtual visit with a [facility] provider today. Just as with appointments in the office, your consent must be obtained to participate. Your consent will be active for this visit and any virtual visit you may have with one of our providers in the next 365 days. If you have a MyChart account, a copy of this consent can be sent to you electronically.  As this is a virtual visit, video technology does not allow for your provider to perform a traditional examination. This may limit your provider's ability to fully assess your condition. If your provider identifies any concerns that need to be evaluated in person or the need to arrange testing (such as labs, EKG, etc.), we will make arrangements to do so. Although advances in technology are sophisticated, we cannot ensure that it will always work on either your end or our end. If the connection with a video visit is poor, the visit may have to be switched to a telephone visit. With either a video or telephone visit, we are not always able to ensure that we have a secure connection.  By engaging in this virtual visit, you consent to the provision of healthcare and authorize for your insurance to be billed (if applicable) for the services provided during this visit. Depending on your insurance coverage, you may receive a charge related to this service.  I need to obtain your verbal consent now. Are you willing to proceed with your visit today? [name] has provided verbal consent on 11/04/2022 for a virtual visit (video or telephone). [name], [place]  Date: 11/04/2022 4:35 PM  Virtual Visit via Video Note   I, [name], connected with  [name]  ([id number], June 21, 1986) on 11/04/22 at  4:15 PM EST by a video-enabled telemedicine application and verified that I am speaking with the correct person using two identifiers.  Location: Patient: Virtual  Visit Location Patient: Home Provider: Virtual Visit Location Provider: Home Office   I discussed the limitations of evaluation and management by telemedicine and the availability of in person appointments. The patient expressed understanding and agreed to proceed.    History of Present Illness: [name] is a 36 y.o. who identifies as a female who was assigned female at birth, and is being seen today for several days of progressively worsening URI symptoms including congestion, cough and shortness of breath. Now over past 24 hours, sputum changing to dark and thick with low-grade fever. Denies chest pain. Was seen via video yesterday and given steroids which she picked up but has misplaced and cannot find them anywhere. Still has her singulair that was also sent in. She is worried about bacterial cause of symptoms as she is prone to bronchitis and pneumonia.   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     Methylphenidate Derivatives Other (See Comments)    Reaction:  Depression and  anger    Neurontin [Gabapentin] Other (See Comments)    Reaction:  Dizziness    Prozac [Fluoxetine Hcl] Other (See Comments)    Reaction:  Anger    Methylphenidate Hcl     SAME AS PREVIOUS LISTED METHYLPHENIDATE   Medications:  Current Outpatient Medications:    doxycycline (VIBRA-TABS) 100 MG tablet, Take 1 tablet (100 mg total) by mouth 2 (two) times daily., Disp: 14 tablet, Rfl: 0   predniSONE (DELTASONE) 20 MG tablet, Take 2 tablets (40 mg total) by mouth daily with breakfast., Disp: 10 tablet, Rfl: 0   acetaminophen (TYLENOL) 500 MG tablet, Take 2 tablets (1,000 mg total) by mouth every 8 (eight) hours as needed (pain)., Disp: 60 tablet, Rfl: 0   albuterol (VENTOLIN HFA) 108 (90 Base) MCG/ACT inhaler, Inhale 2 puffs into the lungs every 6 (six) hours as needed for wheezing or shortness of breath., Disp: 8 g, Rfl: 2   ALPRAZolam (XANAX) 1 MG tablet, Take 1 mg by mouth daily as needed., Disp: , Rfl:    ARIPiprazole (ABILIFY IM), Inject 300 mg into the muscle every 30 (thirty) days., Disp: , Rfl:    Blood Pressure Monitoring (BLOOD PRESSURE KIT) DEVI, 1 Device by Does not apply route once a week., Disp: 1 each, Rfl: 0   docusate sodium (COLACE) 100 MG capsule, Take 1 capsule (100 mg total) by mouth 2 (two) times daily., Disp: 30 capsule, Rfl: 0   fluticasone (FLONASE) 50 MCG/ACT nasal spray, Place 2 sprays into both nostrils daily., Disp: 16 g, Rfl: 6   ibuprofen (ADVIL) 800 MG tablet, Take 1 tablet (800 mg total) by mouth every 8 (eight) hours as needed., Disp: 30 tablet, Rfl: 0   montelukast (SINGULAIR) 10 MG tablet, Take 1 tablet (10 mg total) by mouth at bedtime., Disp: 30 tablet, Rfl: 3   umeclidinium-vilanterol (ANORO ELLIPTA) 62.5-25 MCG/ACT AEPB, Inhale 1 puff into the lungs daily at 6 (six) AM., Disp: 1 each, Rfl: 0   VRAYLAR 1.5 MG capsule, Take 1.5 mg by mouth daily., Disp: , Rfl:   Observations/Objective: Patient is well-developed, well-nourished in no acute distress.  Resting  comfortably at home.  Head is normocephalic, atraumatic.  No labored breathing. Speech is clear and coherent with logical content.  Patient is alert and oriented at baseline.   Assessment and Plan: 1. Acute bacterial bronchitis - predniSONE (DELTASONE) 20 MG tablet; Take 2 tablets (40 mg total) by mouth daily with breakfast.  Dispense: 10 tablet; Refill: 0 - doxycycline (VIBRA-TABS) 100 MG tablet; Take 1 tablet (100 mg total) by mouth 2 (two) times daily.  Dispense: 14 tablet; Refill: 0  Not breastfeeding. Rx doxycycline as she has tolerated well in past. Will give one-time refill of prednisone burst giving she has lost her script.  Increase fluids.  Rest.  Saline nasal spray.  Probiotic.  Mucinex as directed.  Humidifier in bedroom.  Call or return to clinic if symptoms are not improving.   Follow Up Instructions: I discussed the assessment and treatment plan with the patient. The patient was provided an opportunity to ask questions and all were answered. The patient agreed with the plan and  demonstrated an understanding of the instructions.  A copy of instructions were sent to the patient via MyChart unless otherwise noted below.    The patient was advised to call back or seek an in-person evaluation if the symptoms worsen or if the condition fails to improve as anticipated.  Time:  I spent 10 minutes with the patient via telehealth technology discussing the above problems/concerns.    Nichole Rio, PA-C

## 2022-11-12 ENCOUNTER — Telehealth: Payer: 59 | Admitting: Family Medicine

## 2022-11-12 DIAGNOSIS — B9689 Other specified bacterial agents as the cause of diseases classified elsewhere: Secondary | ICD-10-CM

## 2022-11-12 DIAGNOSIS — J019 Acute sinusitis, unspecified: Secondary | ICD-10-CM

## 2022-11-12 MED ORDER — DOXYCYCLINE HYCLATE 100 MG PO TABS: 100.0000 mg | ORAL_TABLET | Freq: Two times a day (BID) | ORAL | 0 refills | Status: AC

## 2022-11-12 MED ORDER — PSEUDOEPH-BROMPHEN-DM 30-2-10 MG/5ML PO SYRP: 5.0000 mL | ORAL_SOLUTION | Freq: Three times a day (TID) | ORAL | 0 refills | Status: DC | PRN

## 2022-11-12 MED ORDER — ONDANSETRON HCL 4 MG PO TABS: 4.0000 mg | ORAL_TABLET | Freq: Three times a day (TID) | ORAL | 0 refills | Status: DC | PRN

## 2022-11-12 NOTE — Patient Instructions (Addendum)
[name], thank you for joining Perlie Mayo, NP for today's virtual visit.  While this provider is not your primary care provider (PCP), if your PCP is located in our provider database this encounter information will be shared with them immediately following your visit.   [facility] account gives you access to today's visit and all your visits, tests, and labs performed at [facility] " click here if you don't have a [facility] account or go to [email or web address]  Consent: (Patient) [name] provided verbal consent for this virtual visit at the beginning of the encounter.  Current Medications:  Current Outpatient Medications:    brompheniramine-pseudoephedrine-DM 30-2-10 MG/5ML syrup, Take 5 mLs by mouth 3 (three) times daily as needed., Disp: 120 mL, Rfl: 0   doxycycline (VIBRA-TABS) 100 MG tablet, Take 1 tablet (100 mg total) by mouth 2 (two) times daily for 10 days., Disp: 20 tablet, Rfl: 0   ondansetron (ZOFRAN) 4 MG tablet, Take 1 tablet (4 mg total) by mouth every 8 (eight) hours as needed for nausea or vomiting., Disp: 15 tablet, Rfl: 0   acetaminophen (TYLENOL) 500 MG tablet, Take 2 tablets (1,000 mg total) by mouth every 8 (eight) hours as needed (pain)., Disp: 60 tablet, Rfl: 0   albuterol (VENTOLIN HFA) 108 (90 Base) MCG/ACT inhaler, Inhale 2 puffs into the lungs every 6 (six) hours as needed for wheezing or shortness of breath., Disp: 8 g, Rfl: 2   ALPRAZolam (XANAX) 1 MG tablet, Take 1 mg by mouth daily as needed., Disp: , Rfl:    ARIPiprazole (ABILIFY IM), Inject 300 mg into the muscle every 30 (thirty) days., Disp: , Rfl:    Blood Pressure Monitoring (BLOOD PRESSURE KIT) DEVI, 1 Device by Does not apply route once a week., Disp: 1 each, Rfl: 0   docusate sodium (COLACE) 100 MG capsule, Take 1 capsule (100 mg total) by mouth 2 (two) times daily., Disp: 30 capsule, Rfl: 0   fluticasone (FLONASE) 50 MCG/ACT nasal spray,  Place 2 sprays into both nostrils daily., Disp: 16 g, Rfl: 6   ibuprofen (ADVIL) 800 MG tablet, Take 1 tablet (800 mg total) by mouth every 8 (eight) hours as needed., Disp: 30 tablet, Rfl: 0   montelukast (SINGULAIR) 10 MG tablet, Take 1 tablet (10 mg total) by mouth at bedtime., Disp: 30 tablet, Rfl: 3   predniSONE (DELTASONE) 20 MG tablet, Take 2 tablets (40 mg total) by mouth daily with breakfast., Disp: 10 tablet, Rfl: 0   umeclidinium-vilanterol (ANORO ELLIPTA) 62.5-25 MCG/ACT AEPB, Inhale 1 puff into the lungs daily at 6 (six) AM., Disp: 1 each, Rfl: 0   VRAYLAR 1.5 MG capsule, Take 1.5 mg by mouth daily., Disp: , Rfl:    Medications ordered in this encounter:  Meds ordered this encounter  Medications   doxycycline (VIBRA-TABS) 100 MG tablet    Sig: Take 1 tablet (100 mg total) by mouth 2 (two) times daily for 10 days.    Dispense:  20 tablet    Refill:  0    Order Specific Question:   Supervising Provider    Answer:   LAMPTEY, PHILIP O [[id number]]   ondansetron (ZOFRAN) 4 MG tablet    Sig: Take 1 tablet (4 mg total) by mouth every 8 (eight) hours as needed for nausea or vomiting.    Dispense:  15 tablet    Refill:  0    Order Specific Question:   Supervising Provider  Answer:   Chase Picket [[id number]]   brompheniramine-pseudoephedrine-DM 30-2-10 MG/5ML syrup    Sig: Take 5 mLs by mouth 3 (three) times daily as needed.    Dispense:  120 mL    Refill:  0    Order Specific Question:   Supervising Provider    Answer:   Chase Picket [[id number]]     *If you need refills on other medications prior to your next appointment, please contact your pharmacy*  Follow-Up: Call back or seek an in-person evaluation if the symptoms worsen or if the condition fails to improve as anticipated.  [facility] [phone]  Other Instructions  -Take meds as prescribed -Flonase -Rest -Use a cool mist humidifier especially during the winter months when heat dries out the  air. - Use saline nose sprays frequently to help soothe nasal passages and promote drainage. -Saline irrigations of the nose can be very helpful if done frequently.             * 4X daily for 1 week*             * Use of a nettie pot can be helpful with this.  *Follow directions with this* *Boiled or distilled water only -stay hydrated by drinking plenty of fluids - Keep thermostat turn down low to prevent drying out sinuses - For any cough or congestion- robitussin DM or Delsym as needed - For fever or aches or pains- take tylenol or ibuprofen as directed on bottle             * for fevers greater than 101 orally you may alternate ibuprofen and tylenol every 3 hours.  If you do not improve you will need a follow up visit in person.  Please make sure to keeps up with medication and take fully- as we can not provide another refill on this again.            If you have been instructed to have an in-person evaluation today at a local Urgent Care facility, please use the link below. It will take you to a list of all of our available [facility], including address, phone number and hours of operation. Please do not delay care.  [facility]  If you or a family member do not have a primary care provider, use the link below to schedule a visit and establish care. When you choose a [facility] primary care physician or advanced practice provider, you gain a long-term partner in health. Find a Primary Care Provider  Learn more about [facility] in-office and virtual care options: [facility]

## 2022-11-12 NOTE — Progress Notes (Signed)
Virtual Visit Consent   [name], you are scheduled for a virtual visit with a [facility] provider today. Just as with appointments in the office, your consent must be obtained to participate. Your consent will be active for this visit and any virtual visit you may have with one of our providers in the next 365 days. If you have a MyChart account, a copy of this consent can be sent to you electronically.  As this is a virtual visit, video technology does not allow for your provider to perform a traditional examination. This may limit your provider's ability to fully assess your condition. If your provider identifies any concerns that need to be evaluated in person or the need to arrange testing (such as labs, EKG, etc.), we will make arrangements to do so. Although advances in technology are sophisticated, we cannot ensure that it will always work on either your end or our end. If the connection with a video visit is poor, the visit may have to be switched to a telephone visit. With either a video or telephone visit, we are not always able to ensure that we have a secure connection.  By engaging in this virtual visit, you consent to the provision of healthcare and authorize for your insurance to be billed (if applicable) for the services provided during this visit. Depending on your insurance coverage, you may receive a charge related to this service.  I need to obtain your verbal consent now. Are you willing to proceed with your visit today? [name] has provided verbal consent on 11/12/2022 for a virtual visit (video or telephone). Nichole Mayo, NP  Date: 11/12/2022 9:25 AM  Virtual Visit via Video Note   I, [name], connected with  [name]  ([id number], 09-14-1986) on 11/12/22 at  9:30 AM EST by a video-enabled telemedicine application and verified that I am speaking with the correct person using two identifiers.  Location: Patient: Virtual Visit Location  Patient: Home Provider: Virtual Visit Location Provider: Home Office   I discussed the limitations of evaluation and management by telemedicine and the availability of in person appointments. The patient expressed understanding and agreed to proceed.    History of Present Illness: [name] is a 36 y.o. who identifies as a female who was assigned female at birth, and is being seen today for Worsening URI- congestion, cough. Sputum has lighten in color. Was seen by video visit on 11/04/2022- given doxycycline, started it but left in car when care broke down and unable to get the medication to complete- thought that she was going to get better with half dose she took- but did not. Improved shortness breath. Denies fevers, chills, chest pain. Still has her singulair that was also sent in. She is worried about bacterial cause of symptoms as she is prone to bronchitis and pneumonia.    Of note- had misplaced steroids the visit prior to last video.   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      Methylphenidate Derivatives Other (See Comments)    Reaction:  Depression and anger    Neurontin [Gabapentin] Other (See Comments)    Reaction:  Dizziness    Prozac [Fluoxetine Hcl] Other (See Comments)    Reaction:  Anger    Methylphenidate Hcl     SAME AS PREVIOUS LISTED METHYLPHENIDATE   Medications:  Current Outpatient Medications:    acetaminophen (TYLENOL) 500 MG tablet, Take 2 tablets (1,000 mg total) by mouth every 8 (eight) hours as needed (pain)., Disp: 60 tablet, Rfl: 0   albuterol (VENTOLIN HFA) 108 (90 Base) MCG/ACT inhaler, Inhale 2 puffs into the lungs every 6 (six) hours as needed for wheezing or shortness of breath., Disp: 8 g, Rfl: 2   ALPRAZolam (XANAX) 1 MG tablet, Take 1 mg by mouth daily as needed., Disp: , Rfl:    ARIPiprazole (ABILIFY IM), Inject 300 mg into the muscle every 30 (thirty) days., Disp: , Rfl:    Blood Pressure Monitoring (BLOOD PRESSURE KIT) DEVI, 1 Device by Does not apply route once a week., Disp: 1 each, Rfl: 0   docusate sodium (COLACE) 100 MG capsule, Take 1 capsule (100 mg total) by mouth 2 (two) times daily., Disp: 30 capsule, Rfl: 0   fluticasone (FLONASE) 50 MCG/ACT nasal spray, Place 2 sprays into both nostrils daily., Disp: 16 g, Rfl: 6   ibuprofen (ADVIL) 800 MG tablet, Take 1 tablet (800 mg total) by mouth every 8 (eight) hours as needed., Disp: 30 tablet, Rfl: 0   montelukast (SINGULAIR) 10 MG tablet, Take 1 tablet (10 mg total) by mouth at bedtime., Disp: 30 tablet, Rfl: 3   predniSONE (DELTASONE) 20 MG tablet, Take 2 tablets (40 mg total) by mouth daily with breakfast., Disp: 10 tablet, Rfl: 0   umeclidinium-vilanterol (ANORO ELLIPTA) 62.5-25 MCG/ACT AEPB, Inhale 1 puff into the lungs daily at 6 (six) AM., Disp: 1 each, Rfl: 0   VRAYLAR 1.5 MG capsule, Take 1.5 mg by mouth daily., Disp: , Rfl:   Observations/Objective: Patient is well-developed, well-nourished in no acute distress.  Resting comfortably  at home.  Head is normocephalic,  atraumatic.  No labored breathing.  Speech is clear and coherent with logical content.  Patient is alert and oriented at baseline.    Assessment and Plan:   1. Acute bacterial sinusitis  - doxycycline (VIBRA-TABS) 100 MG tablet; Take 1 tablet (100 mg total) by mouth 2 (two) times daily for 10 days.  Dispense: 20 tablet; Refill: 0 - ondansetron (ZOFRAN) 4 MG tablet; Take 1 tablet (4 mg total) by mouth every 8 (eight) hours as needed for nausea or vomiting.  Dispense: 15 tablet; Refill: 0 - brompheniramine-pseudoephedrine-DM 30-2-10 MG/5ML syrup; Take 5 mLs by mouth 3 (three) times daily as needed.  Dispense: 120 mL; Refill: 0  -Take meds as prescribed -Flonase -Rest -Use a cool mist humidifier especially during the winter months when heat dries out the air. - Use saline nose sprays frequently to help soothe nasal passages and promote drainage. -Saline irrigations of the nose can be very helpful if done frequently.             *  4X daily for 1 week*             * Use of a nettie pot can be helpful with this.  *Follow directions with this* *Boiled or distilled water only -stay hydrated by drinking plenty of fluids - Keep thermostat turn down low to prevent drying out sinuses - For any cough or congestion- robitussin DM or Delsym as needed - For fever or aches or pains- take tylenol or ibuprofen as directed on bottle             * for fevers greater than 101 orally you may alternate ibuprofen and tylenol every 3 hours.  If you do not improve you will need a follow up visit in person.  She is advised to make sure she keeps up with medication and takes fully- as we can not provide another refill on this again.                Reviewed side effects, risks and benefits of medication.    Patient acknowledged agreement and understanding of the plan.   Past Medical, Surgical, Social History, Allergies, and Medications have been Reviewed.    Follow Up Instructions: I discussed the  assessment and treatment plan with the patient. The patient was provided an opportunity to ask questions and all were answered. The patient agreed with the plan and demonstrated an understanding of the instructions.  A copy of instructions were sent to the patient via MyChart unless otherwise noted below.     The patient was advised to call back or seek an in-person evaluation if the symptoms worsen or if the condition fails to improve as anticipated.  Time:  I spent 10 minutes with the patient via telehealth technology discussing the above problems/concerns.    Nichole Mayo, NP

## 2022-11-24 ENCOUNTER — Ambulatory Visit (INDEPENDENT_AMBULATORY_CARE_PROVIDER_SITE_OTHER): Payer: 59 | Admitting: Primary Care

## 2022-12-11 ENCOUNTER — Ambulatory Visit: Payer: 59 | Admitting: Obstetrics & Gynecology

## 2022-12-14 ENCOUNTER — Telehealth: Payer: 59 | Admitting: Family

## 2022-12-14 ENCOUNTER — Encounter: Payer: Self-pay | Admitting: Family

## 2022-12-14 DIAGNOSIS — Z20828 Contact with and (suspected) exposure to other viral communicable diseases: Secondary | ICD-10-CM | POA: Diagnosis not present

## 2022-12-14 DIAGNOSIS — R6889 Other general symptoms and signs: Secondary | ICD-10-CM

## 2022-12-14 MED ORDER — OSELTAMIVIR PHOSPHATE 75 MG PO CAPS: 75.0000 mg | ORAL_CAPSULE | Freq: Two times a day (BID) | ORAL | 0 refills | Status: DC

## 2022-12-14 NOTE — Progress Notes (Signed)
Virtual Visit Consent   [name], you are scheduled for a virtual visit with a [facility] provider today. Just as with appointments in the office, your consent must be obtained to participate. Your consent will be active for this visit and any virtual visit you may have with one of our providers in the next 365 days. If you have a MyChart account, a copy of this consent can be sent to you electronically.  As this is a virtual visit, video technology does not allow for your provider to perform a traditional examination. This may limit your provider's ability to fully assess your condition. If your provider identifies any concerns that need to be evaluated in person or the need to arrange testing (such as labs, EKG, etc.), we will make arrangements to do so. Although advances in technology are sophisticated, we cannot ensure that it will always work on either your end or our end. If the connection with a video visit is poor, the visit may have to be switched to a telephone visit. With either a video or telephone visit, we are not always able to ensure that we have a secure connection.  By engaging in this virtual visit, you consent to the provision of healthcare and authorize for your insurance to be billed (if applicable) for the services provided during this visit. Depending on your insurance coverage, you may receive a charge related to this service.  I need to obtain your verbal consent now. Are you willing to proceed with your visit today? [name] has provided verbal consent on 12/14/2022 for a virtual visit (video or telephone). [name], FNP  Date: 12/14/2022 3:39 PM  Virtual Visit via Video Note   I, [name], connected with  [name]  ([id number], 04-Sep-1986) on 12/14/22 at  6:15 PM EST by a video-enabled telemedicine application and verified that I am speaking with the correct person using two identifiers.  Location: Patient: Virtual Visit Location  Patient: Home Provider: Virtual Visit Location Provider: Home Office   I discussed the limitations of evaluation and management by telemedicine and the availability of in person appointments. The patient expressed understanding and agreed to proceed.    History of Present Illness: [name] is a 36 y.o. who identifies as a female who was assigned female at birth, and is being seen today for flu like symptoms that started yesterday. States her son tested positive last week.   HPI: Influenza This is a new problem. The current episode started yesterday. The problem occurs constantly. The problem has been unchanged. Associated symptoms include chills, congestion, coughing, fatigue, a fever, joint swelling, myalgias, nausea, a sore throat and vomiting. Pertinent negatives include no headaches. She has tried acetaminophen and drinking for the symptoms.    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     Methylphenidate Derivatives Other (See Comments)    Reaction:  Depression and anger    Neurontin  [Gabapentin] Other (See Comments)    Reaction:  Dizziness    Prozac [Fluoxetine Hcl] Other (See Comments)    Reaction:  Anger    Methylphenidate Hcl     SAME AS PREVIOUS LISTED METHYLPHENIDATE   Medications:  Current Outpatient Medications:    oseltamivir (TAMIFLU) 75 MG capsule, Take 1 capsule (75 mg total) by mouth 2 (two) times daily., Disp: 10 capsule, Rfl: 0   acetaminophen (TYLENOL) 500 MG tablet, Take 2 tablets (1,000 mg total) by mouth every 8 (eight) hours as needed (pain)., Disp: 60 tablet, Rfl: 0   albuterol (VENTOLIN HFA) 108 (90 Base) MCG/ACT inhaler, Inhale 2 puffs into the lungs every 6 (six) hours as needed for wheezing or shortness of breath., Disp: 8 g, Rfl: 2   ALPRAZolam (XANAX) 1 MG tablet, Take 1 mg by mouth daily as needed., Disp: , Rfl:    ARIPiprazole (ABILIFY IM), Inject 300 mg into the muscle every 30 (thirty) days., Disp: , Rfl:    Blood Pressure Monitoring (BLOOD PRESSURE KIT) DEVI, 1 Device by Does not apply route once a week., Disp: 1 each, Rfl: 0   brompheniramine-pseudoephedrine-DM 30-2-10 MG/5ML syrup, Take 5 mLs by mouth 3 (three) times daily as needed., Disp: 120 mL, Rfl: 0   docusate sodium (COLACE) 100 MG capsule, Take 1 capsule (100 mg total) by mouth 2 (two) times daily., Disp: 30 capsule, Rfl: 0   fluticasone (FLONASE) 50 MCG/ACT nasal spray, Place 2 sprays into both nostrils daily., Disp: 16 g, Rfl: 6   ibuprofen (ADVIL) 800 MG tablet, Take 1 tablet (800 mg total) by mouth every 8 (eight) hours as needed., Disp: 30 tablet, Rfl: 0   montelukast (SINGULAIR) 10 MG tablet, Take 1 tablet (10 mg total) by mouth at bedtime., Disp: 30 tablet, Rfl: 3   ondansetron (ZOFRAN) 4 MG tablet, Take 1 tablet (4 mg total) by mouth every 8 (eight) hours as needed for nausea or vomiting., Disp: 15 tablet, Rfl: 0   predniSONE (DELTASONE) 20 MG tablet, Take 2 tablets (40 mg total) by mouth daily with breakfast., Disp: 10 tablet, Rfl: 0   umeclidinium-vilanterol (ANORO  ELLIPTA) 62.5-25 MCG/ACT AEPB, Inhale 1 puff into the lungs daily at 6 (six) AM., Disp: 1 each, Rfl: 0   VRAYLAR 1.5 MG capsule, Take 1.5 mg by mouth daily., Disp: , Rfl:   Observations/Objective: Patient is well-developed, well-nourished in no acute distress.  Resting comfortably  at home.  Head is normocephalic, atraumatic.  No labored breathing.  Speech is clear and coherent with logical content.  Patient is alert and oriented at baseline.  Nasal congestion  Assessment and Plan: 1. Flu-like symptoms - oseltamivir (TAMIFLU) 75 MG capsule; Take 1 capsule (75 mg total) by mouth 2 (two) times daily.  Dispense: 10 capsule; Refill: 0  2. Exposure to influenza - oseltamivir (TAMIFLU) 75 MG capsule; Take 1 capsule (75 mg total) by mouth 2 (two) times daily.  Dispense: 10 capsule; Refill: 0  Rest Force fluids Tylenol as needed Tamiflu BID Follow up if symptoms worsen or do not improve   Follow Up Instructions: I discussed the assessment and treatment plan with the patient. The patient was provided an opportunity to ask questions and all  were answered. The patient agreed with the plan and demonstrated an understanding of the instructions.  A copy of instructions were sent to the patient via MyChart unless otherwise noted below.    The patient was advised to call back or seek an in-person evaluation if the symptoms worsen or if the condition fails to improve as anticipated.  Time:  I spent 9 minutes with the patient via telehealth technology discussing the above problems/concerns.    [name], FNP

## 2022-12-15 ENCOUNTER — Telehealth: Payer: 59 | Admitting: Family Medicine

## 2022-12-15 DIAGNOSIS — R6889 Other general symptoms and signs: Secondary | ICD-10-CM

## 2022-12-15 NOTE — Progress Notes (Signed)
Arp   Advised to be seen in person given the on going symptoms. Reports has not started Tamiflu at this time- waiting on it to be delivered. Was seen yesterday on VV for Flu like illness.  Patient acknowledged agreement and understanding of the plan.

## 2022-12-16 ENCOUNTER — Other Ambulatory Visit: Payer: Self-pay | Admitting: Family

## 2022-12-16 DIAGNOSIS — J4520 Mild intermittent asthma, uncomplicated: Secondary | ICD-10-CM

## 2023-01-21 ENCOUNTER — Telehealth: Payer: Medicare HMO | Admitting: Physician Assistant

## 2023-01-21 DIAGNOSIS — K219 Gastro-esophageal reflux disease without esophagitis: Secondary | ICD-10-CM | POA: Diagnosis not present

## 2023-01-21 DIAGNOSIS — J019 Acute sinusitis, unspecified: Secondary | ICD-10-CM | POA: Diagnosis not present

## 2023-01-21 DIAGNOSIS — B9689 Other specified bacterial agents as the cause of diseases classified elsewhere: Secondary | ICD-10-CM

## 2023-01-21 MED ORDER — PANTOPRAZOLE SODIUM 40 MG PO TBEC: 40.0000 mg | DELAYED_RELEASE_TABLET | Freq: Every day | ORAL | 0 refills | Status: DC

## 2023-01-21 MED ORDER — PSEUDOEPH-BROMPHEN-DM 30-2-10 MG/5ML PO SYRP: 5.0000 mL | ORAL_SOLUTION | Freq: Four times a day (QID) | ORAL | 0 refills | Status: DC | PRN

## 2023-01-21 MED ORDER — ONDANSETRON 4 MG PO TBDP: 4.0000 mg | ORAL_TABLET | Freq: Three times a day (TID) | ORAL | 0 refills | Status: DC | PRN

## 2023-01-21 MED ORDER — AMOXICILLIN-POT CLAVULANATE 875-125 MG PO TABS: 1.0000 | ORAL_TABLET | Freq: Two times a day (BID) | ORAL | 0 refills | Status: DC

## 2023-01-21 NOTE — Progress Notes (Signed)
Virtual Visit Consent   [name], you are scheduled for a virtual visit with a [facility] provider today. Just as with appointments in the office, your consent must be obtained to participate. Your consent will be active for this visit and any virtual visit you may have with one of our providers in the next 365 days. If you have a MyChart account, a copy of this consent can be sent to you electronically.  As this is a virtual visit, video technology does not allow for your provider to perform a traditional examination. This may limit your provider's ability to fully assess your condition. If your provider identifies any concerns that need to be evaluated in person or the need to arrange testing (such as labs, EKG, etc.), we will make arrangements to do so. Although advances in technology are sophisticated, we cannot ensure that it will always work on either your end or our end. If the connection with a video visit is poor, the visit may have to be switched to a telephone visit. With either a video or telephone visit, we are not always able to ensure that we have a secure connection.  By engaging in this virtual visit, you consent to the provision of healthcare and authorize for your insurance to be billed (if applicable) for the services provided during this visit. Depending on your insurance coverage, you may receive a charge related to this service.  I need to obtain your verbal consent now. Are you willing to proceed with your visit today? [name] has provided verbal consent on 01/21/2023 for a virtual visit (video or telephone). [name], [place]  Date: 01/21/2023 9:52 AM  Virtual Visit via Video Note   I, [name], connected with  [name]  ([id number], 1986/09/18) on 01/21/23 at  9:45 AM EST by a video-enabled telemedicine application and verified that I am speaking with the correct person using two identifiers.  Location: Patient: Virtual Visit  Location Patient: Home Provider: Virtual Visit Location Provider: Home Office   I discussed the limitations of evaluation and management by telemedicine and the availability of in person appointments. The patient expressed understanding and agreed to proceed.    History of Present Illness: [name] is a 37 y.o. who identifies as a female who was assigned female at birth, and is being seen today for URI symptoms starting last week with nasal drainage, sinus pressure and cough. Worsening over past 24 hours with sinus pain and the drainage is causing her increased reflux, resulting in an episode of non-bloody emesis. No fever at present. Son and husband have been sick recently. Has history of GERD but not taken anything for this is some time. Denies recent change to diet.    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     Methylphenidate Derivatives Other (See Comments)    Reaction:  Depression and anger    Neurontin [Gabapentin] Other (See Comments)    Reaction:  Dizziness    Prozac  [Fluoxetine Hcl] Other (See Comments)    Reaction:  Anger    Methylphenidate Hcl     SAME AS PREVIOUS LISTED METHYLPHENIDATE   Medications:  Current Outpatient Medications:    amoxicillin-clavulanate (AUGMENTIN) 875-125 MG tablet, Take 1 tablet by mouth 2 (two) times daily., Disp: 14 tablet, Rfl: 0   brompheniramine-pseudoephedrine-DM 30-2-10 MG/5ML syrup, Take 5 mLs by mouth 4 (four) times daily as needed., Disp: 120 mL, Rfl: 0   ondansetron (ZOFRAN-ODT) 4 MG disintegrating tablet, Take 1 tablet (4 mg total) by mouth every 8 (eight) hours as needed for nausea or vomiting., Disp: 20 tablet, Rfl: 0   pantoprazole (PROTONIX) 40 MG tablet, Take 1 tablet (40 mg total) by mouth daily., Disp: 30 tablet, Rfl: 0   acetaminophen (TYLENOL) 500 MG tablet, Take 2 tablets (1,000 mg total) by mouth every 8 (eight) hours as needed (pain)., Disp: 60 tablet, Rfl: 0   albuterol (VENTOLIN HFA) 108 (90 Base) MCG/ACT inhaler, Inhale 2 puffs into the lungs every 6 (six) hours as needed for wheezing or shortness of breath., Disp: 8 g, Rfl: 2   ALPRAZolam (XANAX) 1 MG tablet, Take 1 mg by mouth daily as needed., Disp: , Rfl:    ARIPiprazole (ABILIFY IM), Inject 300 mg into the muscle every 30 (thirty) days., Disp: , Rfl:    Blood Pressure Monitoring (BLOOD PRESSURE KIT) DEVI, 1 Device by Does not apply route once a week., Disp: 1 each, Rfl: 0   docusate sodium (COLACE) 100 MG capsule, Take 1 capsule (100 mg total) by mouth 2 (two) times daily., Disp: 30 capsule, Rfl: 0   fluticasone (FLONASE) 50 MCG/ACT nasal spray, Place 2 sprays into both nostrils daily., Disp: 16 g, Rfl: 6   ibuprofen (ADVIL) 800 MG tablet, Take 1 tablet (800 mg total) by mouth every 8 (eight) hours as needed., Disp: 30 tablet, Rfl: 0   montelukast (SINGULAIR) 10 MG tablet, Take 1 tablet (10 mg total) by mouth at bedtime., Disp: 30 tablet, Rfl: 3   umeclidinium-vilanterol (ANORO ELLIPTA) 62.5-25 MCG/ACT AEPB, Inhale 1 puff into the lungs daily at 6  (six) AM., Disp: 1 each, Rfl: 0   VRAYLAR 1.5 MG capsule, Take 1.5 mg by mouth daily., Disp: , Rfl:   Observations/Objective: Patient is well-developed, well-nourished in no acute distress.  Resting comfortably at home.  Head is normocephalic, atraumatic.  No labored breathing. Speech is clear and coherent with logical content.  Patient is alert and oriented at baseline.   Assessment and Plan: 1. Acute bacterial sinusitis - amoxicillin-clavulanate (AUGMENTIN) 875-125 MG tablet; Take 1 tablet by mouth 2 (two) times daily.  Dispense: 14 tablet; Refill: 0 - brompheniramine-pseudoephedrine-DM 30-2-10 MG/5ML syrup; Take 5 mLs by mouth 4 (four) times daily as needed.  Dispense: 120 mL; Refill: 0  Rx Augmentin.  Increase fluids.  Rest.  Saline nasal spray.  Probiotic.  Mucinex as directed.  Humidifier in bedroom. Bromfed-Dm per orders.  Call or return to clinic if symptoms are not improving.   2. Gastroesophageal reflux disease without esophagitis - pantoprazole (PROTONIX) 40 MG tablet; Take 1 tablet (40 mg total) by  mouth daily.  Dispense: 30 tablet; Refill: 0 - ondansetron (ZOFRAN-ODT) 4 MG disintegrating tablet; Take 1 tablet (4 mg total) by mouth every 8 (eight) hours as needed for nausea or vomiting.  Dispense: 20 tablet; Refill: 0  Suspect exacerbated due to the amount of post-nasal drip. Start Pantoprazole daily. Zofran as needed until getting under better control.  Follow Up Instructions: I discussed the assessment and treatment plan with the patient. The patient was provided an opportunity to ask questions and all were answered. The patient agreed with the plan and demonstrated an understanding of the instructions.  A copy of instructions were sent to the patient via MyChart unless otherwise noted below.   The patient was advised to call back or seek an in-person evaluation if the symptoms worsen or if the condition fails to improve as anticipated.  Time:  I spent 10 minutes with the  patient via telehealth technology discussing the above problems/concerns.    [name], PA-C

## 2023-01-21 NOTE — Patient Instructions (Signed)
[name], thank you for joining [name], PA-C for today's virtual visit.  While this provider is not your primary care provider (PCP), if your PCP is located in our provider database this encounter information will be shared with them immediately following your visit.   [facility] account gives you access to today's visit and all your visits, tests, and labs performed at [facility] " click here if you don't have a [facility] account or go to [email or web address]  Consent: (Patient) [name] provided verbal consent for this virtual visit at the beginning of the encounter.  Current Medications:  Current Outpatient Medications:    amoxicillin-clavulanate (AUGMENTIN) 875-125 MG tablet, Take 1 tablet by mouth 2 (two) times daily., Disp: 14 tablet, Rfl: 0   brompheniramine-pseudoephedrine-DM 30-2-10 MG/5ML syrup, Take 5 mLs by mouth 4 (four) times daily as needed., Disp: 120 mL, Rfl: 0   ondansetron (ZOFRAN-ODT) 4 MG disintegrating tablet, Take 1 tablet (4 mg total) by mouth every 8 (eight) hours as needed for nausea or vomiting., Disp: 20 tablet, Rfl: 0   pantoprazole (PROTONIX) 40 MG tablet, Take 1 tablet (40 mg total) by mouth daily., Disp: 30 tablet, Rfl: 0   acetaminophen (TYLENOL) 500 MG tablet, Take 2 tablets (1,000 mg total) by mouth every 8 (eight) hours as needed (pain)., Disp: 60 tablet, Rfl: 0   albuterol (VENTOLIN HFA) 108 (90 Base) MCG/ACT inhaler, Inhale 2 puffs into the lungs every 6 (six) hours as needed for wheezing or shortness of breath., Disp: 8 g, Rfl: 2   ALPRAZolam (XANAX) 1 MG tablet, Take 1 mg by mouth daily as needed., Disp: , Rfl:    ARIPiprazole (ABILIFY IM), Inject 300 mg into the muscle every 30 (thirty) days., Disp: , Rfl:    Blood Pressure Monitoring (BLOOD PRESSURE KIT) DEVI, 1 Device by Does not apply route once a week., Disp: 1 each, Rfl: 0   docusate sodium (COLACE) 100 MG capsule, Take 1 capsule  (100 mg total) by mouth 2 (two) times daily., Disp: 30 capsule, Rfl: 0   fluticasone (FLONASE) 50 MCG/ACT nasal spray, Place 2 sprays into both nostrils daily., Disp: 16 g, Rfl: 6   ibuprofen (ADVIL) 800 MG tablet, Take 1 tablet (800 mg total) by mouth every 8 (eight) hours as needed., Disp: 30 tablet, Rfl: 0   montelukast (SINGULAIR) 10 MG tablet, Take 1 tablet (10 mg total) by mouth at bedtime., Disp: 30 tablet, Rfl: 3   umeclidinium-vilanterol (ANORO ELLIPTA) 62.5-25 MCG/ACT AEPB, Inhale 1 puff into the lungs daily at 6 (six) AM., Disp: 1 each, Rfl: 0   VRAYLAR 1.5 MG capsule, Take 1.5 mg by mouth daily., Disp: , Rfl:    Medications ordered in this encounter:  Meds ordered this encounter  Medications   amoxicillin-clavulanate (AUGMENTIN) 875-125 MG tablet    Sig: Take 1 tablet by mouth 2 (two) times daily.    Dispense:  14 tablet    Refill:  0    Order Specific Question:   Supervising Provider    Answer:   Chase Picket [[id number]]   pantoprazole (PROTONIX) 40 MG tablet    Sig: Take 1 tablet (40 mg total) by mouth daily.    Dispense:  30 tablet    Refill:  0    Order Specific Question:   Supervising Provider    Answer:   Chase Picket [[id number]]   ondansetron (ZOFRAN-ODT) 4 MG disintegrating tablet    Sig: Take 1 tablet (  4 mg total) by mouth every 8 (eight) hours as needed for nausea or vomiting.    Dispense:  20 tablet    Refill:  0    Order Specific Question:   Supervising Provider    Answer:   Chase Picket [id number]   brompheniramine-pseudoephedrine-DM 30-2-10 MG/5ML syrup    Sig: Take 5 mLs by mouth 4 (four) times daily as needed.    Dispense:  120 mL    Refill:  0    Order Specific Question:   Supervising Provider    Answer:   Chase Picket [[id number]]     *If you need refills on other medications prior to your next appointment, please contact your pharmacy*  Follow-Up: Call back or seek an in-person evaluation if the symptoms worsen or if the condition  fails to improve as anticipated.  [place] [phone]  Other Instructions Please take antibiotic as directed.  Increase fluid intake.  Use Saline nasal spray.  Take a daily multivitamin. Take the Pantoprazole (Protonix) as directed. Use Zofran as directed, if needed, for nausea.  Place a humidifier in the bedroom.  Please call or return clinic if symptoms are not improving.  Sinusitis Sinusitis is redness, soreness, and swelling (inflammation) of the paranasal sinuses. Paranasal sinuses are air pockets within the bones of your face (beneath the eyes, the middle of the forehead, or above the eyes). In healthy paranasal sinuses, mucus is able to drain out, and air is able to circulate through them by way of your nose. However, when your paranasal sinuses are inflamed, mucus and air can become trapped. This can allow bacteria and other germs to grow and cause infection. Sinusitis can develop quickly and last only a short time (acute) or continue over a long period (chronic). Sinusitis that lasts for more than 12 weeks is considered chronic.  CAUSES  Causes of sinusitis include: Allergies. Structural abnormalities, such as displacement of the cartilage that separates your nostrils (deviated septum), which can decrease the air flow through your nose and sinuses and affect sinus drainage. Functional abnormalities, such as when the small hairs (cilia) that line your sinuses and help remove mucus do not work properly or are not present. SYMPTOMS  Symptoms of acute and chronic sinusitis are the same. The primary symptoms are pain and pressure around the affected sinuses. Other symptoms include: Upper toothache. Earache. Headache. Bad breath. Decreased sense of smell and taste. A cough, which worsens when you are lying flat. Fatigue. Fever. Thick drainage from your nose, which often is green and may contain pus (purulent). Swelling and warmth over the affected sinuses. DIAGNOSIS   Your caregiver will perform a physical exam. During the exam, your caregiver may: Look in your nose for signs of abnormal growths in your nostrils (nasal polyps). Tap over the affected sinus to check for signs of infection. View the inside of your sinuses (endoscopy) with a special imaging device with a light attached (endoscope), which is inserted into your sinuses. If your caregiver suspects that you have chronic sinusitis, one or more of the following tests may be recommended: Allergy tests. Nasal culture A sample of mucus is taken from your nose and sent to a lab and screened for bacteria. Nasal cytology A sample of mucus is taken from your nose and examined by your caregiver to determine if your sinusitis is related to an allergy. TREATMENT  Most cases of acute sinusitis are related to a viral infection and will resolve on their own  within 10 days. Sometimes medicines are prescribed to help relieve symptoms (pain medicine, decongestants, nasal steroid sprays, or saline sprays).  However, for sinusitis related to a bacterial infection, your caregiver will prescribe antibiotic medicines. These are medicines that will help kill the bacteria causing the infection.  Rarely, sinusitis is caused by a fungal infection. In theses cases, your caregiver will prescribe antifungal medicine. For some cases of chronic sinusitis, surgery is needed. Generally, these are cases in which sinusitis recurs more than 3 times per year, despite other treatments. HOME CARE INSTRUCTIONS  Drink plenty of water. Water helps thin the mucus so your sinuses can drain more easily. Use a humidifier. Inhale steam 3 to 4 times a day (for example, sit in the bathroom with the shower running). Apply a warm, moist washcloth to your face 3 to 4 times a day, or as directed by your caregiver. Use saline nasal sprays to help moisten and clean your sinuses. Take over-the-counter or prescription medicines for pain, discomfort, or  fever only as directed by your caregiver. SEEK IMMEDIATE MEDICAL CARE IF: You have increasing pain or severe headaches. You have nausea, vomiting, or drowsiness. You have swelling around your face. You have vision problems. You have a stiff neck. You have difficulty breathing. MAKE SURE YOU:  Understand these instructions. Will watch your condition. Will get help right away if you are not doing well or get worse. Document Released: 12/01/2005 Document Revised: 02/23/2012 Document Reviewed: 12/16/2011 [facility] Patient Information 2014 [place], [place].    If you have been instructed to have an in-person evaluation today at a local Urgent Care facility, please use the link below. It will take you to a list of all of our available [facility], including address, phone number and hours of operation. Please do not delay care.  [facility]  If you or a family member do not have a primary care provider, use the link below to schedule a visit and establish care. When you choose a [facility] primary care physician or advanced practice provider, you gain a long-term partner in health. Find a Primary Care Provider  Learn more about 's in-office and virtual care options: [facility]

## 2023-03-02 DIAGNOSIS — F418 Other specified anxiety disorders: Secondary | ICD-10-CM | POA: Diagnosis not present

## 2023-03-02 DIAGNOSIS — F3162 Bipolar disorder, current episode mixed, moderate: Secondary | ICD-10-CM | POA: Diagnosis not present

## 2023-03-06 DIAGNOSIS — F3162 Bipolar disorder, current episode mixed, moderate: Secondary | ICD-10-CM | POA: Diagnosis not present

## 2023-03-06 DIAGNOSIS — F418 Other specified anxiety disorders: Secondary | ICD-10-CM | POA: Diagnosis not present

## 2023-03-07 DIAGNOSIS — F3162 Bipolar disorder, current episode mixed, moderate: Secondary | ICD-10-CM | POA: Diagnosis not present

## 2023-03-07 DIAGNOSIS — F418 Other specified anxiety disorders: Secondary | ICD-10-CM | POA: Diagnosis not present

## 2023-03-13 ENCOUNTER — Telehealth: Payer: Medicare HMO | Admitting: Family Medicine

## 2023-03-13 NOTE — Progress Notes (Signed)
No show for visit. DWB

## 2023-03-26 ENCOUNTER — Telehealth: Payer: Self-pay | Admitting: *Deleted

## 2023-03-26 NOTE — Telephone Encounter (Signed)
[name] called front desk and asked what her blood type is. I called [name] back and informed her per chart review her blood type is O+. She asked if we know information about weight/ etc for kidney donation. I explained that I do not have that information. She voices understanding. [name]

## 2023-03-27 DIAGNOSIS — F3162 Bipolar disorder, current episode mixed, moderate: Secondary | ICD-10-CM | POA: Diagnosis not present

## 2023-03-27 DIAGNOSIS — F418 Other specified anxiety disorders: Secondary | ICD-10-CM | POA: Diagnosis not present

## 2023-03-30 ENCOUNTER — Encounter: Payer: Self-pay | Admitting: *Deleted

## 2023-04-09 ENCOUNTER — Telehealth: Payer: Medicare HMO | Admitting: Physician Assistant

## 2023-04-09 DIAGNOSIS — J4531 Mild persistent asthma with (acute) exacerbation: Secondary | ICD-10-CM | POA: Diagnosis not present

## 2023-04-09 MED ORDER — ALBUTEROL SULFATE HFA 108 (90 BASE) MCG/ACT IN AERS: 1.0000 | INHALATION_SPRAY | Freq: Four times a day (QID) | RESPIRATORY_TRACT | 0 refills | Status: DC | PRN

## 2023-04-09 MED ORDER — PREDNISONE 20 MG PO TABS: 40.0000 mg | ORAL_TABLET | Freq: Every day | ORAL | 0 refills | Status: DC

## 2023-04-09 MED ORDER — FLUTICASONE-SALMETEROL 115-21 MCG/ACT IN AERO: 2.0000 | INHALATION_SPRAY | Freq: Two times a day (BID) | RESPIRATORY_TRACT | 1 refills | Status: DC

## 2023-04-09 NOTE — Progress Notes (Signed)
Virtual Visit Consent   [name], you are scheduled for a virtual visit with a [facility] provider today. Just as with appointments in the office, your consent must be obtained to participate. Your consent will be active for this visit and any virtual visit you may have with one of our providers in the next 365 days. If you have a MyChart account, a copy of this consent can be sent to you electronically.  As this is a virtual visit, video technology does not allow for your provider to perform a traditional examination. This may limit your provider's ability to fully assess your condition. If your provider identifies any concerns that need to be evaluated in person or the need to arrange testing (such as labs, EKG, etc.), we will make arrangements to do so. Although advances in technology are sophisticated, we cannot ensure that it will always work on either your end or our end. If the connection with a video visit is poor, the visit may have to be switched to a telephone visit. With either a video or telephone visit, we are not always able to ensure that we have a secure connection.  By engaging in this virtual visit, you consent to the provision of healthcare and authorize for your insurance to be billed (if applicable) for the services provided during this visit. Depending on your insurance coverage, you may receive a charge related to this service.  I need to obtain your verbal consent now. Are you willing to proceed with your visit today? [name] has provided verbal consent on 04/09/2023 for a virtual visit (video or telephone). [name], [name]  Date: 04/09/2023 6:17 PM  Virtual Visit via Video Note   I, [name], connected with  [name]  ([id number], 29-May-1986) on 04/09/23 at  6:00 PM EDT by a video-enabled telemedicine application and verified that I am speaking with the correct person using two identifiers.  Location: Patient: Virtual Visit  Location Patient: Home Provider: Virtual Visit Location Provider: Home Office   I discussed the limitations of evaluation and management by telemedicine and the availability of in person appointments. The patient expressed understanding and agreed to proceed.    History of Present Illness: [name] is a 37 y.o. who identifies as a female who was assigned female at birth, and is being seen today for increase of asthmatic symptoms today after sitting outside. Notes nasal congestion, chest tightness and wheezing. Denies fever, chills, aches. Does not have any more of her albuterol and has been without a maintenance inhaler for some time as she said the last one sent in was not covered and she no longer has a PCP.   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     Methylphenidate Derivatives Other (See Comments)    Reaction:  Depression and anger    Neurontin [Gabapentin] Other (See Comments)    Reaction:  Dizziness    Prozac [Fluoxetine Hcl] Other (See Comments)    Reaction:  Anger     Methylphenidate Hcl     SAME AS PREVIOUS LISTED METHYLPHENIDATE   Medications:  Current Outpatient Medications:    albuterol (VENTOLIN HFA) 108 (90 Base) MCG/ACT inhaler, Inhale 1-2 puffs into the lungs every 6 (six) hours as needed for wheezing or shortness of breath., Disp: 8 g, Rfl: 0   chlorproMAZINE (THORAZINE) 25 MG tablet, Take 25 mg by mouth at bedtime., Disp: , Rfl:    fluticasone-salmeterol (ADVAIR HFA) 115-21 MCG/ACT inhaler, Inhale 2 puffs into the lungs 2 (two) times daily., Disp: 1 each, Rfl: 1   predniSONE (DELTASONE) 20 MG tablet, Take 2 tablets (40 mg total) by mouth daily with breakfast. To take only if asthmatic symptoms continue despite restarting your inhalers., Disp: 10 tablet, Rfl: 0   propranolol (INDERAL) 20 MG tablet, Take by mouth., Disp: , Rfl:    acetaminophen (TYLENOL) 500 MG tablet, Take 2 tablets (1,000 mg total) by mouth every 8 (eight) hours as needed (pain)., Disp: 60 tablet, Rfl: 0   ALPRAZolam (XANAX) 1 MG tablet, Take 1 mg by mouth daily as needed., Disp: , Rfl:    ARIPiprazole (ABILIFY IM), Inject 300 mg into the muscle every 30 (thirty) days., Disp: , Rfl:    Blood Pressure Monitoring (BLOOD PRESSURE KIT) DEVI, 1 Device by Does not apply route once a week., Disp: 1 each, Rfl: 0   docusate sodium (COLACE) 100 MG capsule, Take 1 capsule (100 mg total) by mouth 2 (two) times daily., Disp: 30 capsule, Rfl: 0   fluticasone (FLONASE) 50 MCG/ACT nasal spray, Place 2 sprays into both nostrils daily., Disp: 16 g, Rfl: 6   ibuprofen (ADVIL) 800 MG tablet, Take 1 tablet (800 mg total) by mouth every 8 (eight) hours as needed., Disp: 30 tablet, Rfl: 0   montelukast (SINGULAIR) 10 MG tablet, Take 1 tablet (10 mg total) by mouth at bedtime., Disp: 30 tablet, Rfl: 3   ondansetron (ZOFRAN-ODT) 4 MG disintegrating tablet, Take 1 tablet (4 mg total) by mouth every 8 (eight) hours as needed for nausea or vomiting., Disp: 20 tablet, Rfl: 0   pantoprazole (PROTONIX) 40 MG  tablet, Take 1 tablet (40 mg total) by mouth daily., Disp: 30 tablet, Rfl: 0   VRAYLAR 1.5 MG capsule, Take 1.5 mg by mouth daily., Disp: , Rfl:   Observations/Objective: Patient is well-developed, well-nourished in no acute distress.  Resting comfortably at home.  Head is normocephalic, atraumatic.  No labored breathing. Audible wheezing and nasal stuffiness. Speech is clear and coherent with logical content.  Patient is alert and oriented at baseline.   Assessment and Plan: 1. Mild persistent asthma with acute exacerbation - fluticasone-salmeterol (ADVAIR HFA) 115-21 MCG/ACT inhaler; Inhale 2 puffs into the lungs 2 (two) times daily.  Dispense: 1 each; Refill: 1 - albuterol (VENTOLIN HFA) 108 (90 Base) MCG/ACT inhaler; Inhale 1-2 puffs into the lungs every 6 (six) hours as needed for wheezing or shortness of breath.  Dispense: 8 g; Refill: 0 - predniSONE (DELTASONE) 20 MG tablet; Take 2 tablets (40 mg total) by mouth daily with breakfast. To take only if asthmatic symptoms continue despite restarting  your inhalers.  Dispense: 10 tablet; Refill: 0  Supportive measures and OTC medications reviewed. Refilled rescue inhaler. For maintenance inhaler during allergy season, reviewed formulary. Preferred medication is Advair. Script sent in to start as directed. Script for prednisone placed on file to start if symptoms not getting under control with her inhaled medications. ER precautions reviewed.   Follow Up Instructions: I discussed the assessment and treatment plan with the patient. The patient was provided an opportunity to ask questions and all were answered. The patient agreed with the plan and demonstrated an understanding of the instructions.  A copy of instructions were sent to the patient via MyChart unless otherwise noted below.   The patient was advised to call back or seek an in-person evaluation if the symptoms worsen or if the condition fails to improve as anticipated.  Time:  I  spent 10 minutes with the patient via telehealth technology discussing the above problems/concerns.    Nichole Climes, PA-C

## 2023-04-09 NOTE — Patient Instructions (Signed)
[name], thank you for joining [name], PA-C for today's virtual visit.  While this provider is not your primary care provider (PCP), if your PCP is located in our provider database this encounter information will be shared with them immediately following your visit.   A [facility] MyChart account gives you access to today's visit and all your visits, tests, and labs performed at [facility] " click here if you don't have a [facility] MyChart account or go to [email or web address]  Consent: (Patient) [name] provided verbal consent for this virtual visit at the beginning of the encounter.  Current Medications:  Current Outpatient Medications:    albuterol (VENTOLIN HFA) 108 (90 Base) MCG/ACT inhaler, Inhale 1-2 puffs into the lungs every 6 (six) hours as needed for wheezing or shortness of breath., Disp: 8 g, Rfl: 0   chlorproMAZINE (THORAZINE) 25 MG tablet, Take 25 mg by mouth at bedtime., Disp: , Rfl:    fluticasone-salmeterol (ADVAIR HFA) 115-21 MCG/ACT inhaler, Inhale 2 puffs into the lungs 2 (two) times daily., Disp: 1 each, Rfl: 1   predniSONE (DELTASONE) 20 MG tablet, Take 2 tablets (40 mg total) by mouth daily with breakfast. To take only if asthmatic symptoms continue despite restarting your inhalers., Disp: 10 tablet, Rfl: 0   propranolol (INDERAL) 20 MG tablet, Take by mouth., Disp: , Rfl:    acetaminophen (TYLENOL) 500 MG tablet, Take 2 tablets (1,000 mg total) by mouth every 8 (eight) hours as needed (pain)., Disp: 60 tablet, Rfl: 0   ALPRAZolam (XANAX) 1 MG tablet, Take 1 mg by mouth daily as needed., Disp: , Rfl:    ARIPiprazole (ABILIFY IM), Inject 300 mg into the muscle every 30 (thirty) days., Disp: , Rfl:    Blood Pressure Monitoring (BLOOD PRESSURE KIT) DEVI, 1 Device by Does not apply route once a week., Disp: 1 each, Rfl: 0   docusate sodium (COLACE) 100 MG capsule, Take 1 capsule (100 mg total) by mouth 2 (two) times  daily., Disp: 30 capsule, Rfl: 0   fluticasone (FLONASE) 50 MCG/ACT nasal spray, Place 2 sprays into both nostrils daily., Disp: 16 g, Rfl: 6   ibuprofen (ADVIL) 800 MG tablet, Take 1 tablet (800 mg total) by mouth every 8 (eight) hours as needed., Disp: 30 tablet, Rfl: 0   montelukast (SINGULAIR) 10 MG tablet, Take 1 tablet (10 mg total) by mouth at bedtime., Disp: 30 tablet, Rfl: 3   ondansetron (ZOFRAN-ODT) 4 MG disintegrating tablet, Take 1 tablet (4 mg total) by mouth every 8 (eight) hours as needed for nausea or vomiting., Disp: 20 tablet, Rfl: 0   pantoprazole (PROTONIX) 40 MG tablet, Take 1 tablet (40 mg total) by mouth daily., Disp: 30 tablet, Rfl: 0   VRAYLAR 1.5 MG capsule, Take 1.5 mg by mouth daily., Disp: , Rfl:    Medications ordered in this encounter:  Meds ordered this encounter  Medications   fluticasone-salmeterol (ADVAIR HFA) 115-21 MCG/ACT inhaler    Sig: Inhale 2 puffs into the lungs 2 (two) times daily.    Dispense:  1 each    Refill:  1    Order Specific Question:   Supervising Provider    Answer:   Loreli Dollar   albuterol (VENTOLIN HFA) 108 (90 Base) MCG/ACT inhaler    Sig: Inhale 1-2 puffs into the lungs every 6 (six) hours as needed for wheezing or shortness of breath.    Dispense:  8 g    Refill:  0    Order Specific Question:   Supervising Provider    Answer:   Merrilee Jansky [[id number]]   predniSONE (DELTASONE) 20 MG tablet    Sig: Take 2 tablets (40 mg total) by mouth daily with breakfast. To take only if asthmatic symptoms continue despite restarting your inhalers.    Dispense:  10 tablet    Refill:  0    Order Specific Question:   Supervising Provider    Answer:   Merrilee Jansky [id number]     *If you need refills on other medications prior to your next appointment, please contact your pharmacy*  Follow-Up: Call back or seek an in-person evaluation if the symptoms worsen or if the condition fails to improve as anticipated.  [facility] [phone]  Other Instructions Asthma, Adult  Asthma is a condition that causes swelling and narrowing of the airways. These are the passages that lead from the nose and mouth down into the lungs. When asthma symptoms get worse it is called an asthma attack or flare. This can make it hard to breathe. Asthma flares can range from minor to life-threatening. There is no cure for asthma, but medicines and lifestyle changes can help to control it. What are the causes? It is not known exactly what causes asthma, but certain things can cause asthma symptoms to get worse (triggers). What can trigger an asthma attack? Cigarette smoke. Mold. Dust. Your pet's skin flakes (dander). Cockroaches. Pollen. Air pollution (like household cleaners, wood smoke, smog, or Therapist, occupational). What are the signs or symptoms? Trouble breathing (shortness of breath). Coughing. Making high-pitched whistling sounds when you breathe, most often when you breathe out (wheezing). Chest tightness. Tiredness with little activity. Poor exercise tolerance. How is this treated? Controller medicines that help prevent asthma symptoms. Fast-acting reliever or rescue medicines. These give short-term relief of asthma symptoms. Allergy medicines if your attacks are brought on by allergens. Medicines to help control the body's defense (immune) system. Staying away from the things that cause asthma attacks. Follow these instructions at home: Avoiding triggers in your home Do not allow anyone to smoke in your home. Limit use of fireplaces and wood stoves. Get rid of pests (such as roaches and mice) and their droppings. Keep your home clean. Clean your floors. Dust regularly. Use cleaning products that do not smell. Wash bed sheets and blankets every week in hot water. Dry them in a dryer. Have someone vacuum when you are not home. Change your heating and air conditioning filters often. Use blankets  that are made of polyester or cotton. General instructions Take over-the-counter and prescription medicines only as told by your doctor. Do not smoke or use any products that contain nicotine or tobacco. If you need help quitting, ask your doctor. Stay away from secondhand smoke. Avoid doing things outdoors when allergen counts are high and when air quality is low. Warm up before you exercise. Take time to cool down after exercise. Use a peak flow meter as told by your doctor. A peak flow meter is a tool that measures how well your lungs are working. Keep track of the peak flow meter's readings. Write them down. Follow your asthma action plan. This is a written plan for taking care of your asthma and treating your attacks. Make sure you get all the shots (vaccines) that your doctor recommends. Ask your doctor about a flu shot and a pneumonia shot. Keep all follow-up visits. Contact a doctor if: You have  wheezing, shortness of breath, or a cough even while taking medicine to prevent attacks. The mucus you cough up (sputum) is thicker than usual. The mucus you cough up changes from clear or white to yellow, green, gray, or is bloody. You have problems from the medicine you are taking, such as: A rash. Itching. Swelling. Trouble breathing. You need reliever medicines more than 2-3 times a week. Your peak flow reading is still at 50-79% of your personal best after following the action plan for 1 hour. You have a fever. Get help right away if: You seem to be worse and are not responding to medicine during an asthma attack. You are short of breath even at rest. You get short of breath when doing very little activity. You have trouble eating, drinking, or talking. You have chest pain or tightness. You have a fast heartbeat. Your lips or fingernails start to turn blue. You are light-headed or dizzy, or you faint. Your peak flow is less than 50% of your personal best. You feel too tired to  breathe normally. These symptoms may be an emergency. Get help right away. Call 911. Do not wait to see if the symptoms will go away. Do not drive yourself to the hospital. Summary Asthma is a long-term (chronic) condition in which the airways get tight and narrow. An asthma attack can make it hard to breathe. Asthma cannot be cured, but medicines and lifestyle changes can help control it. Make sure you understand how to avoid triggers and how and when to use your medicines. Avoid things that can cause allergy symptoms (allergens). These include animal skin flakes (dander) and pollen from trees or grass. Avoid things that pollute the air. These may include household cleaners, wood smoke, smog, or chemical odors. This information is not intended to replace advice given to you by your health care provider. Make sure you discuss any questions you have with your health care provider. Document Revised: 09/09/2021 Document Reviewed: 09/09/2021 Elsevier Patient Education  2023 Elsevier Inc.    If you have been instructed to have an in-person evaluation today at a local Urgent Care facility, please use the link below. It will take you to a list of all of our available [facility], including address, phone number and hours of operation. Please do not delay care.  [facility]  If you or a family member do not have a primary care provider, use the link below to schedule a visit and establish care. When you choose a [facility] primary care physician or advanced practice provider, you gain a long-term partner in health. Find a Primary Care Provider  Learn more about [facility] in-office and virtual care options: [facility] - Get Care Now

## 2023-04-15 ENCOUNTER — Ambulatory Visit: Payer: Medicare HMO | Admitting: Family Medicine

## 2023-04-21 DIAGNOSIS — F418 Other specified anxiety disorders: Secondary | ICD-10-CM | POA: Diagnosis not present

## 2023-04-21 DIAGNOSIS — F3162 Bipolar disorder, current episode mixed, moderate: Secondary | ICD-10-CM | POA: Diagnosis not present

## 2023-04-27 ENCOUNTER — Ambulatory Visit: Payer: Medicare HMO | Admitting: Student

## 2023-05-15 ENCOUNTER — Telehealth: Payer: Medicare HMO | Admitting: Family Medicine

## 2023-05-15 DIAGNOSIS — R110 Nausea: Secondary | ICD-10-CM | POA: Diagnosis not present

## 2023-05-15 DIAGNOSIS — J069 Acute upper respiratory infection, unspecified: Secondary | ICD-10-CM | POA: Diagnosis not present

## 2023-05-15 MED ORDER — ONDANSETRON 4 MG PO TBDP: 4.0000 mg | ORAL_TABLET | Freq: Three times a day (TID) | ORAL | 0 refills | Status: DC | PRN

## 2023-05-15 MED ORDER — PSEUDOEPH-BROMPHEN-DM 30-2-10 MG/5ML PO SYRP: 5.0000 mL | ORAL_SOLUTION | Freq: Four times a day (QID) | ORAL | 0 refills | Status: DC | PRN

## 2023-05-15 MED ORDER — FLUTICASONE PROPIONATE 50 MCG/ACT NA SUSP
2.0000 | Freq: Every day | NASAL | 1 refills | Status: DC
Start: 2023-05-15 — End: 2023-07-16

## 2023-05-15 MED ORDER — LEVOCETIRIZINE DIHYDROCHLORIDE 5 MG PO TABS
5.0000 mg | ORAL_TABLET | Freq: Every evening | ORAL | 0 refills | Status: DC
Start: 2023-05-15 — End: 2024-03-01

## 2023-05-15 NOTE — Patient Instructions (Signed)
[name], thank you for joining Freddy Finner, NP for today's virtual visit.  While this provider is not your primary care provider (PCP), if your PCP is located in our provider database this encounter information will be shared with them immediately following your visit.   A [facility] MyChart account gives you access to today's visit and all your visits, tests, and labs performed at [facility] " click here if you don't have a [facility] MyChart account or go to [email or web address]  Consent: (Patient) [name] provided verbal consent for this virtual visit at the beginning of the encounter.  Current Medications:  Current Outpatient Medications:    brompheniramine-pseudoephedrine-DM 30-2-10 MG/5ML syrup, Take 5 mLs by mouth 4 (four) times daily as needed., Disp: 120 mL, Rfl: 0   fluticasone (FLONASE) 50 MCG/ACT nasal spray, Place 2 sprays into both nostrils daily., Disp: 16 g, Rfl: 1   levocetirizine (XYZAL) 5 MG tablet, Take 1 tablet (5 mg total) by mouth every evening., Disp: 30 tablet, Rfl: 0   ondansetron (ZOFRAN-ODT) 4 MG disintegrating tablet, Take 1 tablet (4 mg total) by mouth every 8 (eight) hours as needed for nausea or vomiting., Disp: 20 tablet, Rfl: 0   acetaminophen (TYLENOL) 500 MG tablet, Take 2 tablets (1,000 mg total) by mouth every 8 (eight) hours as needed (pain)., Disp: 60 tablet, Rfl: 0   albuterol (VENTOLIN HFA) 108 (90 Base) MCG/ACT inhaler, Inhale 1-2 puffs into the lungs every 6 (six) hours as needed for wheezing or shortness of breath., Disp: 8 g, Rfl: 0   ALPRAZolam (XANAX) 1 MG tablet, Take 1 mg by mouth daily as needed., Disp: , Rfl:    ARIPiprazole (ABILIFY IM), Inject 300 mg into the muscle every 30 (thirty) days., Disp: , Rfl:    Blood Pressure Monitoring (BLOOD PRESSURE KIT) DEVI, 1 Device by Does not apply route once a week., Disp: 1 each, Rfl: 0   chlorproMAZINE (THORAZINE) 25 MG tablet, Take 25 mg by mouth at  bedtime., Disp: , Rfl:    docusate sodium (COLACE) 100 MG capsule, Take 1 capsule (100 mg total) by mouth 2 (two) times daily., Disp: 30 capsule, Rfl: 0   fluticasone-salmeterol (ADVAIR HFA) 115-21 MCG/ACT inhaler, Inhale 2 puffs into the lungs 2 (two) times daily., Disp: 1 each, Rfl: 1   ibuprofen (ADVIL) 800 MG tablet, Take 1 tablet (800 mg total) by mouth every 8 (eight) hours as needed., Disp: 30 tablet, Rfl: 0   montelukast (SINGULAIR) 10 MG tablet, Take 1 tablet (10 mg total) by mouth at bedtime., Disp: 30 tablet, Rfl: 3   pantoprazole (PROTONIX) 40 MG tablet, Take 1 tablet (40 mg total) by mouth daily., Disp: 30 tablet, Rfl: 0   predniSONE (DELTASONE) 20 MG tablet, Take 2 tablets (40 mg total) by mouth daily with breakfast. To take only if asthmatic symptoms continue despite restarting your inhalers., Disp: 10 tablet, Rfl: 0   propranolol (INDERAL) 20 MG tablet, Take by mouth., Disp: , Rfl:    VRAYLAR 1.5 MG capsule, Take 1.5 mg by mouth daily., Disp: , Rfl:    Medications ordered in this encounter:  Meds ordered this encounter  Medications   ondansetron (ZOFRAN-ODT) 4 MG disintegrating tablet    Sig: Take 1 tablet (4 mg total) by mouth every 8 (eight) hours as needed for nausea or vomiting.    Dispense:  20 tablet    Refill:  0    Order Specific Question:   Supervising Provider  Answer:   Merrilee Jansky [[id number]]   brompheniramine-pseudoephedrine-DM 30-2-10 MG/5ML syrup    Sig: Take 5 mLs by mouth 4 (four) times daily as needed.    Dispense:  120 mL    Refill:  0    Order Specific Question:   Supervising Provider    Answer:   Merrilee Jansky [[id number]]   fluticasone (FLONASE) 50 MCG/ACT nasal spray    Sig: Place 2 sprays into both nostrils daily.    Dispense:  16 g    Refill:  1    Order Specific Question:   Supervising Provider    Answer:   Merrilee Jansky [[id number]]   levocetirizine (XYZAL) 5 MG tablet    Sig: Take 1 tablet (5 mg total) by mouth every evening.     Dispense:  30 tablet    Refill:  0    Order Specific Question:   Supervising Provider    Answer:   Merrilee Jansky [id number]     *If you need refills on other medications prior to your next appointment, please contact your pharmacy*  Follow-Up: Call back or seek an in-person evaluation if the symptoms worsen or if the condition fails to improve as anticipated.  [facility] [phone]  Other Instructions  URI recommendations: - Increased rest - Increasing Fluids - Acetaminophen / ibuprofen as needed for fever/pain.  - Salt water gargling, chloraseptic spray and throat lozenges - Mucinex if mucus is present and increasing.  - Saline nasal spray if congestion or if nasal passages feel dry. - Humidifying the air.    If you have been instructed to have an in-person evaluation today at a local Urgent Care facility, please use the link below. It will take you to a list of all of our available [facility], including address, phone number and hours of operation. Please do not delay care.  [facility]  If you or a family member do not have a primary care provider, use the link below to schedule a visit and establish care. When you choose a [facility] primary care physician or advanced practice provider, you gain a long-term partner in health. Find a Primary Care Provider  Learn more about [facility] in-office and virtual care options:  - Get Care Now

## 2023-05-15 NOTE — Progress Notes (Signed)
Virtual Visit Consent   [name], you are scheduled for a virtual visit with a [facility] provider today. Just as with appointments in the office, your consent must be obtained to participate. Your consent will be active for this visit and any virtual visit you may have with one of our providers in the next 365 days. If you have a MyChart account, a copy of this consent can be sent to you electronically.  As this is a virtual visit, video technology does not allow for your provider to perform a traditional examination. This may limit your provider's ability to fully assess your condition. If your provider identifies any concerns that need to be evaluated in person or the need to arrange testing (such as labs, EKG, etc.), we will make arrangements to do so. Although advances in technology are sophisticated, we cannot ensure that it will always work on either your end or our end. If the connection with a video visit is poor, the visit may have to be switched to a telephone visit. With either a video or telephone visit, we are not always able to ensure that we have a secure connection.  By engaging in this virtual visit, you consent to the provision of healthcare and authorize for your insurance to be billed (if applicable) for the services provided during this visit. Depending on your insurance coverage, you may receive a charge related to this service.  I need to obtain your verbal consent now. Are you willing to proceed with your visit today? Amalie Asay Tindall has provided verbal consent on 05/15/2023 for a virtual visit (video or telephone). Freddy Finner, NP  Date: 05/15/2023 3:15 PM  Virtual Visit via Video Note   I, Freddy Finner, connected with  KINGA FLATHERS  ([id number], [birth year]) on 05/15/23 at  3:15 PM EDT by a video-enabled telemedicine application and verified that I am speaking with the correct person using two identifiers.  Location: Patient: Virtual Visit Location  Patient: Home Provider: Virtual Visit Location Provider: Home Office   I discussed the limitations of evaluation and management by telemedicine and the availability of in person appointments. The patient expressed understanding and agreed to proceed.    History of Present Illness: [name] is a 37 y.o. who identifies as a female who was assigned female at birth, and is being seen today for sinus congestion  Onset was 4 days ago- started with sore throat Associated symptoms are runny nose, congestion, coughing- making her nauseous Modifying factors are nothing  Denies chest pain, shortness of breath, fevers, chills  Exposure to sick contacts- she was sick first, but now husband is sick as well. 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     Methylphenidate Derivatives Other (See Comments)    Reaction:  Depression and anger    Neurontin [Gabapentin] Other (See Comments)    Reaction:  Dizziness    Prozac [Fluoxetine Hcl] Other (See Comments)    Reaction:  Anger     Methylphenidate Hcl     SAME AS PREVIOUS LISTED METHYLPHENIDATE   Medications:  Current Outpatient Medications:    acetaminophen (TYLENOL) 500 MG tablet, Take 2 tablets (1,000 mg total) by mouth every 8 (eight) hours as needed (pain)., Disp: 60 tablet, Rfl: 0   albuterol (VENTOLIN HFA) 108 (90 Base) MCG/ACT inhaler, Inhale 1-2 puffs into the lungs every 6 (six) hours as needed for wheezing or shortness of breath., Disp: 8 g, Rfl: 0   ALPRAZolam (XANAX) 1 MG tablet, Take 1 mg by mouth daily as needed., Disp: , Rfl:    ARIPiprazole (ABILIFY IM), Inject 300 mg into the muscle every 30 (thirty) days., Disp: , Rfl:    Blood Pressure Monitoring (BLOOD PRESSURE KIT) DEVI, 1 Device by Does not apply route once a week., Disp: 1 each, Rfl: 0   chlorproMAZINE (THORAZINE) 25 MG tablet, Take 25 mg by mouth at bedtime., Disp: , Rfl:    docusate sodium (COLACE) 100 MG capsule, Take 1 capsule (100 mg total) by mouth 2 (two) times daily., Disp: 30 capsule, Rfl: 0   fluticasone (FLONASE) 50 MCG/ACT nasal spray, Place 2 sprays into both nostrils daily., Disp: 16 g, Rfl: 6   fluticasone-salmeterol (ADVAIR HFA) 115-21 MCG/ACT inhaler, Inhale 2 puffs into the lungs 2 (two) times daily., Disp: 1 each, Rfl: 1   ibuprofen (ADVIL) 800 MG tablet, Take 1 tablet (800 mg total) by mouth every 8 (eight) hours as needed., Disp: 30 tablet, Rfl: 0   montelukast (SINGULAIR) 10 MG tablet, Take 1 tablet (10 mg total) by mouth at bedtime., Disp: 30 tablet, Rfl: 3   ondansetron (ZOFRAN-ODT) 4 MG disintegrating tablet, Take 1 tablet (4 mg total) by mouth every 8 (eight) hours as needed for nausea or vomiting., Disp: 20 tablet, Rfl: 0   pantoprazole (PROTONIX) 40 MG tablet, Take 1 tablet (40 mg total) by mouth daily., Disp: 30 tablet, Rfl: 0   predniSONE (DELTASONE) 20 MG tablet, Take 2 tablets (40 mg total) by mouth daily with breakfast. To take only if asthmatic symptoms continue despite restarting your inhalers., Disp: 10 tablet, Rfl:  0   propranolol (INDERAL) 20 MG tablet, Take by mouth., Disp: , Rfl:    VRAYLAR 1.5 MG capsule, Take 1.5 mg by mouth daily., Disp: , Rfl:   Observations/Objective: Patient is well-developed, well-nourished in no acute distress.  Resting comfortably  at home.  Head is normocephalic, atraumatic.  No labored breathing.  Speech is clear and coherent with logical content.  Patient is alert and oriented at baseline.  Congestion tone noted  Assessment and Plan:  1. Viral URI with cough  - brompheniramine-pseudoephedrine-DM 30-2-10 MG/5ML syrup; Take 5 mLs by mouth 4 (four) times daily as needed.  Dispense: 120 mL; Refill: 0 - fluticasone (FLONASE) 50 MCG/ACT nasal spray; Place 2 sprays into both nostrils daily.  Dispense: 16 g; Refill: 1 - levocetirizine (XYZAL) 5 MG tablet; Take 1 tablet (5 mg total) by mouth every evening.  Dispense: 30 tablet; Refill: 0  2. Nausea  - ondansetron (ZOFRAN-ODT) 4 MG disintegrating tablet; Take 1 tablet (4 mg total) by mouth every  8 (eight) hours as needed for nausea or vomiting.  Dispense: 20 tablet; Refill: 0   URI recommendations: - Increased rest - Increasing Fluids - Acetaminophen / ibuprofen as needed for fever/pain.  - Salt water gargling, chloraseptic spray and throat lozenges - Mucinex if mucus is present and increasing.  - Saline nasal spray if congestion or if nasal passages feel dry. - Humidifying the air.   Reviewed side effects, risks and benefits of medication.    Patient acknowledged agreement and understanding of the plan.   Past Medical, Surgical, Social History, Allergies, and Medications have been Reviewed.   Follow Up Instructions: I discussed the assessment and treatment plan with the patient. The patient was provided an opportunity to ask questions and all were answered. The patient agreed with the plan and demonstrated an understanding of the instructions.  A copy of instructions were sent to the patient via MyChart unless  otherwise noted below.    The patient was advised to call back or seek an in-person evaluation if the symptoms worsen or if the condition fails to improve as anticipated.  Time:  I spent 8 minutes with the patient via telehealth technology discussing the above problems/concerns.    Freddy Finner, NP

## 2023-06-02 DIAGNOSIS — F3162 Bipolar disorder, current episode mixed, moderate: Secondary | ICD-10-CM | POA: Diagnosis not present

## 2023-06-02 DIAGNOSIS — F418 Other specified anxiety disorders: Secondary | ICD-10-CM | POA: Diagnosis not present

## 2023-06-03 ENCOUNTER — Telehealth: Payer: Medicare HMO | Admitting: Physician Assistant

## 2023-06-03 DIAGNOSIS — B372 Candidiasis of skin and nail: Secondary | ICD-10-CM | POA: Diagnosis not present

## 2023-06-03 MED ORDER — NYSTATIN 100000 UNIT/GM EX POWD
1.0000 | Freq: Three times a day (TID) | CUTANEOUS | 0 refills | Status: DC
Start: 2023-06-03 — End: 2023-08-21

## 2023-06-03 MED ORDER — NYSTATIN-TRIAMCINOLONE 100000-0.1 UNIT/GM-% EX OINT
1.0000 | TOPICAL_OINTMENT | Freq: Two times a day (BID) | CUTANEOUS | 0 refills | Status: DC
Start: 2023-06-03 — End: 2024-03-01

## 2023-06-03 NOTE — Progress Notes (Signed)
Virtual Visit Consent   [name], you are scheduled for a virtual visit with a [facility] provider today. Just as with appointments in the office, your consent must be obtained to participate. Your consent will be active for this visit and any virtual visit you may have with one of our providers in the next 365 days. If you have a MyChart account, a copy of this consent can be sent to you electronically.  As this is a virtual visit, video technology does not allow for your provider to perform a traditional examination. This may limit your provider's ability to fully assess your condition. If your provider identifies any concerns that need to be evaluated in person or the need to arrange testing (such as labs, EKG, etc.), we will make arrangements to do so. Although advances in technology are sophisticated, we cannot ensure that it will always work on either your end or our end. If the connection with a video visit is poor, the visit may have to be switched to a telephone visit. With either a video or telephone visit, we are not always able to ensure that we have a secure connection.  By engaging in this virtual visit, you consent to the provision of healthcare and authorize for your insurance to be billed (if applicable) for the services provided during this visit. Depending on your insurance coverage, you may receive a charge related to this service.  I need to obtain your verbal consent now. Are you willing to proceed with your visit today? [name] has provided verbal consent on 06/03/2023 for a virtual visit (video or telephone). [name], [name]  Date: 06/03/2023 12:39 PM  Virtual Visit via Video Note   I, [name], connected with  [name]  ([id number], November 22, 1986) on 06/03/23 at 12:30 PM EDT by a video-enabled telemedicine application and verified that I am speaking with the correct person using two identifiers.  Location: Patient: Virtual  Visit Location Patient: Home Provider: Virtual Visit Location Provider: Home Office   I discussed the limitations of evaluation and management by telemedicine and the availability of in person appointments. The patient expressed understanding and agreed to proceed.    History of Present Illness: [name] is a 37 y.o. who identifies as a female who was assigned female at birth, and is being seen today for rash of her inguinal region first noted today. Notes area is very irritated and red without swelling. Also with similar rash underneath her breasts bilaterally that she has been told before was yeast. Has not applied anything to the area so far.   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     Methylphenidate Derivatives Other (See Comments)    Reaction:  Depression and anger    Neurontin [Gabapentin] Other (See Comments)    Reaction:  Dizziness    Prozac [Fluoxetine Hcl] Other (See Comments)    Reaction:  Anger    Methylphenidate Hcl     SAME AS PREVIOUS LISTED METHYLPHENIDATE    Medications:  Current Outpatient Medications:    nystatin (MYCOSTATIN/NYSTOP) powder, Apply 1 Application topically 3 (three) times daily., Disp: 15 g, Rfl: 0   nystatin-triamcinolone ointment (MYCOLOG), Apply 1 Application topically 2 (two) times daily., Disp: 30 g, Rfl: 0   acetaminophen (TYLENOL) 500 MG tablet, Take 2 tablets (1,000 mg total) by mouth every 8 (eight) hours as needed (pain)., Disp: 60 tablet, Rfl: 0   albuterol (VENTOLIN HFA) 108 (90 Base) MCG/ACT inhaler, Inhale 1-2 puffs into the lungs every 6 (six) hours as needed for wheezing or shortness of breath., Disp: 8 g, Rfl: 0   ALPRAZolam (XANAX) 1 MG tablet, Take 1 mg by mouth daily as needed., Disp: , Rfl:    ARIPiprazole (ABILIFY IM), Inject 300 mg into the muscle every 30 (thirty) days., Disp: , Rfl:    Blood Pressure Monitoring (BLOOD PRESSURE KIT) DEVI, 1 Device by Does not apply route once a week., Disp: 1 each, Rfl: 0   brompheniramine-pseudoephedrine-DM 30-2-10 MG/5ML syrup, Take 5 mLs by mouth 4 (four) times daily as needed., Disp: 120 mL, Rfl: 0   chlorproMAZINE (THORAZINE) 25 MG tablet, Take 25 mg by mouth at bedtime., Disp: , Rfl:    docusate sodium (COLACE) 100 MG capsule, Take 1 capsule (100 mg total) by mouth 2 (two) times daily., Disp: 30 capsule, Rfl: 0   fluticasone (FLONASE) 50 MCG/ACT nasal spray, Place 2 sprays into both nostrils daily., Disp: 16 g, Rfl: 1   fluticasone-salmeterol (ADVAIR HFA) 115-21 MCG/ACT inhaler, Inhale 2 puffs into the lungs 2 (two) times daily., Disp: 1 each, Rfl: 1   ibuprofen (ADVIL) 800 MG tablet, Take 1 tablet (800 mg total) by mouth every 8 (eight) hours as needed., Disp: 30 tablet, Rfl: 0   levocetirizine (XYZAL) 5 MG tablet, Take 1 tablet (5 mg total) by mouth every evening., Disp: 30 tablet, Rfl: 0   montelukast (SINGULAIR) 10 MG tablet, Take 1 tablet (10 mg total) by mouth at bedtime., Disp: 30 tablet, Rfl: 3   ondansetron (ZOFRAN-ODT) 4 MG disintegrating tablet, Take 1 tablet  (4 mg total) by mouth every 8 (eight) hours as needed for nausea or vomiting., Disp: 20 tablet, Rfl: 0   pantoprazole (PROTONIX) 40 MG tablet, Take 1 tablet (40 mg total) by mouth daily., Disp: 30 tablet, Rfl: 0   predniSONE (DELTASONE) 20 MG tablet, Take 2 tablets (40 mg total) by mouth daily with breakfast. To take only if asthmatic symptoms continue despite restarting your inhalers., Disp: 10 tablet, Rfl: 0   propranolol (INDERAL) 20 MG tablet, Take by mouth., Disp: , Rfl:    VRAYLAR 1.5 MG capsule, Take 1.5 mg by mouth daily., Disp: , Rfl:   Observations/Objective: Patient is well-developed, well-nourished in no acute distress.  Resting comfortably  at home.  Head is normocephalic, atraumatic.  No labored breathing.  Speech is clear and coherent with logical content.  Patient is alert and oriented at baseline.    Assessment and Plan: 1. Yeast infection of the skin - nystatin (MYCOSTATIN/NYSTOP) powder; Apply 1 Application topically 3 (three) times daily.  Dispense: 15 g; Refill:  0 - nystatin-triamcinolone ointment (MYCOLOG); Apply 1 Application topically 2 (two) times daily.  Dispense: 30 g; Refill: 0  Keep skin clean and dry. Wear moisture-wicking fabrics. Apply Mycolog as directed for up to 10 days. Switch to powder to use as needed. Follow-up in person if not resolving.    Follow Up Instructions: I discussed the assessment and treatment plan with the patient. The patient was provided an opportunity to ask questions and all were answered. The patient agreed with the plan and demonstrated an understanding of the instructions.  A copy of instructions were sent to the patient via MyChart unless otherwise noted below.    The patient was advised to call back or seek an in-person evaluation if the symptoms worsen or if the condition fails to improve as anticipated.  Time:  I spent 10 minutes with the patient via telehealth technology discussing the above problems/concerns.    [name], PA-C

## 2023-06-03 NOTE — Patient Instructions (Signed)
[name], thank you for joining [name], PA-C for today's virtual visit.  While this provider is not your primary care provider (PCP), if your PCP is located in our provider database this encounter information will be shared with them immediately following your visit.   A [facility] MyChart account gives you access to today's visit and all your visits, tests, and labs performed at [facility] " click here if you don't have a [facility] MyChart account or go to [email or web address]  Consent: (Patient) [name] provided verbal consent for this virtual visit at the beginning of the encounter.  Current Medications:  Current Outpatient Medications:    acetaminophen (TYLENOL) 500 MG tablet, Take 2 tablets (1,000 mg total) by mouth every 8 (eight) hours as needed (pain)., Disp: 60 tablet, Rfl: 0   albuterol (VENTOLIN HFA) 108 (90 Base) MCG/ACT inhaler, Inhale 1-2 puffs into the lungs every 6 (six) hours as needed for wheezing or shortness of breath., Disp: 8 g, Rfl: 0   ALPRAZolam (XANAX) 1 MG tablet, Take 1 mg by mouth daily as needed., Disp: , Rfl:    ARIPiprazole (ABILIFY IM), Inject 300 mg into the muscle every 30 (thirty) days., Disp: , Rfl:    Blood Pressure Monitoring (BLOOD PRESSURE KIT) DEVI, 1 Device by Does not apply route once a week., Disp: 1 each, Rfl: 0   brompheniramine-pseudoephedrine-DM 30-2-10 MG/5ML syrup, Take 5 mLs by mouth 4 (four) times daily as needed., Disp: 120 mL, Rfl: 0   chlorproMAZINE (THORAZINE) 25 MG tablet, Take 25 mg by mouth at bedtime., Disp: , Rfl:    docusate sodium (COLACE) 100 MG capsule, Take 1 capsule (100 mg total) by mouth 2 (two) times daily., Disp: 30 capsule, Rfl: 0   fluticasone (FLONASE) 50 MCG/ACT nasal spray, Place 2 sprays into both nostrils daily., Disp: 16 g, Rfl: 1   fluticasone-salmeterol (ADVAIR HFA) 115-21 MCG/ACT inhaler, Inhale 2 puffs into the lungs 2 (two) times daily., Disp: 1 each, Rfl:  1   ibuprofen (ADVIL) 800 MG tablet, Take 1 tablet (800 mg total) by mouth every 8 (eight) hours as needed., Disp: 30 tablet, Rfl: 0   levocetirizine (XYZAL) 5 MG tablet, Take 1 tablet (5 mg total) by mouth every evening., Disp: 30 tablet, Rfl: 0   montelukast (SINGULAIR) 10 MG tablet, Take 1 tablet (10 mg total) by mouth at bedtime., Disp: 30 tablet, Rfl: 3   ondansetron (ZOFRAN-ODT) 4 MG disintegrating tablet, Take 1 tablet (4 mg total) by mouth every 8 (eight) hours as needed for nausea or vomiting., Disp: 20 tablet, Rfl: 0   pantoprazole (PROTONIX) 40 MG tablet, Take 1 tablet (40 mg total) by mouth daily., Disp: 30 tablet, Rfl: 0   predniSONE (DELTASONE) 20 MG tablet, Take 2 tablets (40 mg total) by mouth daily with breakfast. To take only if asthmatic symptoms continue despite restarting your inhalers., Disp: 10 tablet, Rfl: 0   propranolol (INDERAL) 20 MG tablet, Take by mouth., Disp: , Rfl:    VRAYLAR 1.5 MG capsule, Take 1.5 mg by mouth daily., Disp: , Rfl:    Medications ordered in this encounter:  No orders of the defined types were placed in this encounter.    *If you need refills on other medications prior to your next appointment, please contact your pharmacy*  Follow-Up: Call back or seek an in-person evaluation if the symptoms worsen or if the condition fails to improve as anticipated.  [facility] [phone]  Other  Instructions   If you have been instructed to have an in-person evaluation today at a local Urgent Care facility, please use the link below. It will take you to a list of all of our available [facility], including address, phone number and hours of operation. Please do not delay care.  [facility]  If you or a family member do not have a primary care provider, use the link below to schedule a visit and establish care. When you choose a [facility] primary care physician or advanced practice provider, you gain a  long-term partner in health. Find a Primary Care Provider  Learn more about [facility]'s in-office and virtual care options: [facility] - Get Care Now

## 2023-06-10 ENCOUNTER — Telehealth: Payer: Medicare HMO | Admitting: Physician Assistant

## 2023-06-10 DIAGNOSIS — J069 Acute upper respiratory infection, unspecified: Secondary | ICD-10-CM

## 2023-06-10 DIAGNOSIS — J4531 Mild persistent asthma with (acute) exacerbation: Secondary | ICD-10-CM

## 2023-06-10 DIAGNOSIS — L304 Erythema intertrigo: Secondary | ICD-10-CM

## 2023-06-10 MED ORDER — NYSTATIN 100000 UNIT/GM EX POWD
1.0000 | Freq: Three times a day (TID) | CUTANEOUS | 0 refills | Status: DC
Start: 2023-06-10 — End: 2023-08-21

## 2023-06-10 MED ORDER — ALBUTEROL SULFATE HFA 108 (90 BASE) MCG/ACT IN AERS
1.0000 | INHALATION_SPRAY | Freq: Four times a day (QID) | RESPIRATORY_TRACT | 0 refills | Status: DC | PRN
Start: 2023-06-10 — End: 2023-11-14

## 2023-06-10 MED ORDER — NYSTATIN 100000 UNIT/GM EX CREA
1.0000 | TOPICAL_CREAM | Freq: Two times a day (BID) | CUTANEOUS | 0 refills | Status: DC
Start: 2023-06-10 — End: 2023-08-21

## 2023-06-10 MED ORDER — ONDANSETRON 4 MG PO TBDP
4.0000 mg | ORAL_TABLET | Freq: Three times a day (TID) | ORAL | 0 refills | Status: AC | PRN
Start: 2023-06-10 — End: ?

## 2023-06-10 MED ORDER — FLUCONAZOLE 150 MG PO TABS
150.0000 mg | ORAL_TABLET | ORAL | 0 refills | Status: DC
Start: 2023-06-10 — End: 2024-01-11

## 2023-06-10 MED ORDER — PREDNISONE 20 MG PO TABS
40.0000 mg | ORAL_TABLET | Freq: Every day | ORAL | 0 refills | Status: DC
Start: 2023-06-10 — End: 2023-07-16

## 2023-06-10 MED ORDER — PSEUDOEPH-BROMPHEN-DM 30-2-10 MG/5ML PO SYRP
5.0000 mL | ORAL_SOLUTION | Freq: Four times a day (QID) | ORAL | 0 refills | Status: DC | PRN
Start: 2023-06-10 — End: 2024-01-11

## 2023-06-10 NOTE — Progress Notes (Signed)
Virtual Visit Consent   [name], you are scheduled for a virtual visit with a [facility] provider today. Just as with appointments in the office, your consent must be obtained to participate. Your consent will be active for this visit and any virtual visit you may have with one of our providers in the next 365 days. If you have a MyChart account, a copy of this consent can be sent to you electronically.  As this is a virtual visit, video technology does not allow for your provider to perform a traditional examination. This may limit your provider's ability to fully assess your condition. If your provider identifies any concerns that need to be evaluated in person or the need to arrange testing (such as labs, EKG, etc.), we will make arrangements to do so. Although advances in technology are sophisticated, we cannot ensure that it will always work on either your end or our end. If the connection with a video visit is poor, the visit may have to be switched to a telephone visit. With either a video or telephone visit, we are not always able to ensure that we have a secure connection.  By engaging in this virtual visit, you consent to the provision of healthcare and authorize for your insurance to be billed (if applicable) for the services provided during this visit. Depending on your insurance coverage, you may receive a charge related to this service.  I need to obtain your verbal consent now. Are you willing to proceed with your visit today? Maddyn Lieurance [place] has provided verbal consent on 06/10/2023 for a virtual visit (video or telephone). [name], PA-C  Date: 06/10/2023 4:56 PM  Virtual Visit via Video Note   IMargaretann Loveless, connected with  GLENNYS SCHORSCH  ([id number], 1986/05/20) on 06/10/23 at  5:00 PM EDT by a video-enabled telemedicine application and verified that I am speaking with the correct person using two identifiers.  Location: Patient: Virtual Visit  Location Patient: Home Provider: Virtual Visit Location Provider: Home Office   I discussed the limitations of evaluation and management by telemedicine and the availability of in person appointments. The patient expressed understanding and agreed to proceed.    History of Present Illness: [name] is a 37 y.o. who identifies as a female who was assigned female at birth, and is being seen today for cough and nausea.  HPI: Cough This is a new problem. The current episode started in the past 7 days. The problem has been gradually worsening. The problem occurs constantly. The cough is Productive of sputum and productive of purulent sputum. Associated symptoms include ear congestion (right), postnasal drip, rhinorrhea and a sore throat. Pertinent negatives include no chills, ear pain, fever, headaches, myalgias, nasal congestion, shortness of breath or wheezing. The symptoms are aggravated by lying down. She has tried nothing for the symptoms. The treatment provided no relief. Her past medical history is significant for asthma.    Rash: Red, itchy, burns. Located in arm pits, under breast, and in groin.   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     Methylphenidate Derivatives Other (See Comments)    Reaction:  Depression and anger    Neurontin [Gabapentin] Other (See Comments)    Reaction:  Dizziness    Prozac [Fluoxetine Hcl] Other (See Comments)    Reaction:  Anger    Methylphenidate Hcl     SAME AS PREVIOUS LISTED METHYLPHENIDATE   Medications:  Current Outpatient Medications:    albuterol (VENTOLIN HFA) 108 (90 Base) MCG/ACT inhaler, Inhale 1-2 puffs into the lungs every 6 (six) hours as needed., Disp: 8 g, Rfl: 0   brompheniramine-pseudoephedrine-DM 30-2-10 MG/5ML syrup, Take 5 mLs by mouth 4 (four) times daily as needed., Disp: 120 mL, Rfl: 0   fluconazole (DIFLUCAN) 150 MG tablet, Take 1 tablet (150 mg total) by mouth once a week., Disp: 4 tablet, Rfl: 0   nystatin (MYCOSTATIN/NYSTOP) powder, Apply 1 Application topically 3 (three) times daily., Disp: 60 g, Rfl: 0   nystatin cream (MYCOSTATIN), Apply 1 Application topically 2 (two) times daily., Disp: 30 g, Rfl: 0   ondansetron (ZOFRAN-ODT) 4 MG disintegrating tablet, Take 1 tablet (4 mg total) by mouth every 8 (eight) hours as needed., Disp: 20 tablet, Rfl: 0   predniSONE (DELTASONE) 20 MG tablet, Take 2 tablets (40 mg total) by mouth daily with breakfast., Disp: 10 tablet, Rfl: 0   acetaminophen (TYLENOL) 500 MG tablet, Take 2 tablets (1,000 mg total) by mouth every 8 (eight) hours as needed (pain)., Disp: 60 tablet, Rfl: 0   ALPRAZolam (XANAX) 1 MG tablet, Take 1 mg by mouth daily as needed., Disp: , Rfl:    ARIPiprazole (ABILIFY IM), Inject 300 mg into the muscle every 30 (thirty) days., Disp: , Rfl:    Blood Pressure Monitoring (BLOOD PRESSURE KIT) DEVI, 1 Device by Does not apply route once a week., Disp: 1 each, Rfl: 0   chlorproMAZINE (THORAZINE) 25 MG tablet, Take 25 mg by mouth at bedtime., Disp: , Rfl:    docusate  sodium (COLACE) 100 MG capsule, Take 1 capsule (100 mg total) by mouth 2 (two) times daily., Disp: 30 capsule, Rfl: 0   fluticasone (FLONASE) 50 MCG/ACT nasal spray, Place 2 sprays into both nostrils daily., Disp: 16 g, Rfl: 1   fluticasone-salmeterol (ADVAIR HFA) 115-21 MCG/ACT inhaler, Inhale 2 puffs into the lungs 2 (two) times daily., Disp: 1 each, Rfl: 1   ibuprofen (ADVIL) 800 MG tablet, Take 1 tablet (800 mg total) by mouth every 8 (eight) hours as needed., Disp: 30 tablet, Rfl: 0   levocetirizine (XYZAL) 5 MG tablet, Take 1 tablet (5 mg total) by mouth every evening., Disp: 30 tablet, Rfl: 0   montelukast (SINGULAIR) 10 MG tablet, Take 1 tablet (10 mg total) by mouth at bedtime., Disp: 30 tablet, Rfl: 3   nystatin (MYCOSTATIN/NYSTOP) powder, Apply 1 Application topically 3 (three) times daily., Disp: 15 g, Rfl: 0   nystatin-triamcinolone ointment (MYCOLOG), Apply 1 Application topically 2 (two) times daily., Disp: 30 g, Rfl: 0   pantoprazole (PROTONIX) 40 MG tablet, Take 1 tablet (40 mg total) by mouth  daily., Disp: 30 tablet, Rfl: 0   propranolol (INDERAL) 20 MG tablet, Take by mouth., Disp: , Rfl:    VRAYLAR 1.5 MG capsule, Take 1.5 mg by mouth daily., Disp: , Rfl:   Observations/Objective: Patient is well-developed, well-nourished in no acute distress.  Resting comfortably at home.  Head is normocephalic, atraumatic.  No labored breathing.  Speech is clear and coherent with logical content.  Patient is alert and oriented at baseline.    Assessment and Plan: 1. Viral URI with cough - ondansetron (ZOFRAN-ODT) 4 MG disintegrating tablet; Take 1 tablet (4 mg total) by mouth every 8 (eight) hours as needed.  Dispense: 20 tablet; Refill: 0 - brompheniramine-pseudoephedrine-DM 30-2-10 MG/5ML syrup; Take 5 mLs by mouth 4 (four) times daily as needed.  Dispense: 120 mL; Refill: 0 - albuterol (VENTOLIN HFA) 108 (90 Base) MCG/ACT inhaler; Inhale 1-2 puffs into the lungs every 6 (six) hours  as needed.  Dispense: 8 g; Refill: 0 - predniSONE (DELTASONE) 20 MG tablet; Take 2 tablets (40 mg total) by mouth daily with breakfast.  Dispense: 10 tablet; Refill: 0  2. Mild persistent asthma with acute exacerbation  3. Intertrigo - fluconazole (DIFLUCAN) 150 MG tablet; Take 1 tablet (150 mg total) by mouth once a week.  Dispense: 4 tablet; Refill: 0 - nystatin (MYCOSTATIN/NYSTOP) powder; Apply 1 Application topically 3 (three) times daily.  Dispense: 60 g; Refill: 0 - nystatin cream (MYCOSTATIN); Apply 1 Application topically 2 (two) times daily.  Dispense: 30 g; Refill: 0  - Suspect viral URI with asthma exacerbation - Symptomatic medications of choice over the counter as needed - Bromfed DM cough syrup - Prednisone for asthma.  - Albuterol refilled - Push fluids - Rest - Nystatin cream and powder and fluconazole for intertrigo - Seek further evaluation if symptoms change or worsen   Follow Up Instructions: I discussed the assessment and treatment plan with the patient. The patient was provided an opportunity to ask questions and all were answered. The patient agreed with the plan and demonstrated an understanding of the instructions.  A copy of instructions were sent to the patient via MyChart unless otherwise noted below.    The patient was advised to call back or seek an in-person evaluation if the symptoms worsen or if the condition fails to improve as anticipated.  Time:  I spent 15 minutes with the patient via telehealth technology discussing the above problems/concerns.    [name], PA-C

## 2023-06-10 NOTE — Patient Instructions (Signed)
[name], thank you for joining [name], PA-C for today's virtual visit.  While this provider is not your primary care provider (PCP), if your PCP is located in our provider database this encounter information will be shared with them immediately following your visit.   A [facility] MyChart account gives you access to today's visit and all your visits, tests, and labs performed at [facility] " click here if you don't have a [facility] MyChart account or go to [email or web address]  Consent: (Patient) Lanecia Sliva Daughety provided verbal consent for this virtual visit at the beginning of the encounter.  Current Medications:  Current Outpatient Medications:    albuterol (VENTOLIN HFA) 108 (90 Base) MCG/ACT inhaler, Inhale 1-2 puffs into the lungs every 6 (six) hours as needed., Disp: 8 g, Rfl: 0   brompheniramine-pseudoephedrine-DM 30-2-10 MG/5ML syrup, Take 5 mLs by mouth 4 (four) times daily as needed., Disp: 120 mL, Rfl: 0   fluconazole (DIFLUCAN) 150 MG tablet, Take 1 tablet (150 mg total) by mouth once a week., Disp: 4 tablet, Rfl: 0   nystatin (MYCOSTATIN/NYSTOP) powder, Apply 1 Application topically 3 (three) times daily., Disp: 60 g, Rfl: 0   nystatin cream (MYCOSTATIN), Apply 1 Application topically 2 (two) times daily., Disp: 30 g, Rfl: 0   ondansetron (ZOFRAN-ODT) 4 MG disintegrating tablet, Take 1 tablet (4 mg total) by mouth every 8 (eight) hours as needed., Disp: 20 tablet, Rfl: 0   predniSONE (DELTASONE) 20 MG tablet, Take 2 tablets (40 mg total) by mouth daily with breakfast., Disp: 10 tablet, Rfl: 0   acetaminophen (TYLENOL) 500 MG tablet, Take 2 tablets (1,000 mg total) by mouth every 8 (eight) hours as needed (pain)., Disp: 60 tablet, Rfl: 0   ALPRAZolam (XANAX) 1 MG tablet, Take 1 mg by mouth daily as needed., Disp: , Rfl:    ARIPiprazole (ABILIFY IM), Inject 300 mg into the muscle every 30 (thirty) days., Disp: , Rfl:    Blood Pressure  Monitoring (BLOOD PRESSURE KIT) DEVI, 1 Device by Does not apply route once a week., Disp: 1 each, Rfl: 0   chlorproMAZINE (THORAZINE) 25 MG tablet, Take 25 mg by mouth at bedtime., Disp: , Rfl:    docusate sodium (COLACE) 100 MG capsule, Take 1 capsule (100 mg total) by mouth 2 (two) times daily., Disp: 30 capsule, Rfl: 0   fluticasone (FLONASE) 50 MCG/ACT nasal spray, Place 2 sprays into both nostrils daily., Disp: 16 g, Rfl: 1   fluticasone-salmeterol (ADVAIR HFA) 115-21 MCG/ACT inhaler, Inhale 2 puffs into the lungs 2 (two) times daily., Disp: 1 each, Rfl: 1   ibuprofen (ADVIL) 800 MG tablet, Take 1 tablet (800 mg total) by mouth every 8 (eight) hours as needed., Disp: 30 tablet, Rfl: 0   levocetirizine (XYZAL) 5 MG tablet, Take 1 tablet (5 mg total) by mouth every evening., Disp: 30 tablet, Rfl: 0   montelukast (SINGULAIR) 10 MG tablet, Take 1 tablet (10 mg total) by mouth at bedtime., Disp: 30 tablet, Rfl: 3   nystatin (MYCOSTATIN/NYSTOP) powder, Apply 1 Application topically 3 (three) times daily., Disp: 15 g, Rfl: 0   nystatin-triamcinolone ointment (MYCOLOG), Apply 1 Application topically 2 (two) times daily., Disp: 30 g, Rfl: 0   pantoprazole (PROTONIX) 40 MG tablet, Take 1 tablet (40 mg total) by mouth daily., Disp: 30 tablet, Rfl: 0   propranolol (INDERAL) 20 MG tablet, Take by mouth., Disp: , Rfl:    VRAYLAR 1.5 MG capsule, Take 1.5 mg by mouth  daily., Disp: , Rfl:    Medications ordered in this encounter:  Meds ordered this encounter  Medications   ondansetron (ZOFRAN-ODT) 4 MG disintegrating tablet    Sig: Take 1 tablet (4 mg total) by mouth every 8 (eight) hours as needed.    Dispense:  20 tablet    Refill:  0    Order Specific Question:   Supervising Provider    Answer:   Merrilee Jansky [id number]   brompheniramine-pseudoephedrine-DM 30-2-10 MG/5ML syrup    Sig: Take 5 mLs by mouth 4 (four) times daily as needed.    Dispense:  120 mL    Refill:  0    Order Specific  Question:   Supervising Provider    Answer:   Merrilee Jansky [[id number]]   albuterol (VENTOLIN HFA) 108 (90 Base) MCG/ACT inhaler    Sig: Inhale 1-2 puffs into the lungs every 6 (six) hours as needed.    Dispense:  8 g    Refill:  0    Order Specific Question:   Supervising Provider    Answer:   Merrilee Jansky [[id number]]   predniSONE (DELTASONE) 20 MG tablet    Sig: Take 2 tablets (40 mg total) by mouth daily with breakfast.    Dispense:  10 tablet    Refill:  0    Order Specific Question:   Supervising Provider    Answer:   Merrilee Jansky [[id number]]   fluconazole (DIFLUCAN) 150 MG tablet    Sig: Take 1 tablet (150 mg total) by mouth once a week.    Dispense:  4 tablet    Refill:  0    Order Specific Question:   Supervising Provider    Answer:   Merrilee Jansky [id number]   nystatin (MYCOSTATIN/NYSTOP) powder    Sig: Apply 1 Application topically 3 (three) times daily.    Dispense:  60 g    Refill:  0    Order Specific Question:   Supervising Provider    Answer:   Merrilee Jansky [[id number]]   nystatin cream (MYCOSTATIN)    Sig: Apply 1 Application topically 2 (two) times daily.    Dispense:  30 g    Refill:  0    Order Specific Question:   Supervising Provider    Answer:   Merrilee Jansky [id number]     *If you need refills on other medications prior to your next appointment, please contact your pharmacy*  Follow-Up: Call back or seek an in-person evaluation if the symptoms worsen or if the condition fails to improve as anticipated.  [facility] [phone]  Other Instructions  Intertrigo Celesta Aver is skin irritation (inflammation) that happens in warm, moist areas of the body. The irritation can cause a rash and make skin raw and itchy. The rash is usually pink or red. It happens mostly between folds of skin or where skin rubs together, such as: In the armpits. Under the breasts. Under the belly. In the groin area. Around the butt  area. Between the toes. This condition is not passed from person to person. What are the causes? Heat, moisture, rubbing, and not enough air movement. The condition can be made worse by: Sweat. Bacteria. A fungus, such as yeast. What increases the risk? Moisture in your skin folds. You are more likely to develop this condition if you: Are not able to move around. Live in a warm and moist climate. Are not able to control your pee (urine) or  poop (stool). Wear splints, braces, or other medical devices. Are overweight. Have diabetes. What are the signs or symptoms? A pink or red skin rash in a skin fold or near a skin fold. Raw or scaly skin. Itching. A burning feeling. Bleeding. Leaking fluid. A bad smell. How is this treated? Cleaning and drying your skin. Taking an antibiotic medicine or using an antibiotic skin cream for a bacterial infection. Using an antifungal cream on your skin or taking pills for an infection that was caused by a fungus, such as yeast. Using a steroid ointment to stop the itching and irritation. Separating the skin fold with a clean cotton cloth to absorb moisture and allow air to flow into the area. Follow these instructions at home: Keep the affected area clean and dry. Do not scratch your skin. Stay cool as much as you can. Use an air conditioner or a fan, if you have one. Apply over-the-counter and prescription medicines only as told by your doctor. If you were prescribed antibiotics, use them as told by your doctor. Do not stop using the antibiotic even if you start to feel better. Keep all follow-up visits. Your doctor may need to check your skin to make sure that the treatment is working. How is this prevented? Shower and dry yourself well after being active. Use a hair dryer on a cool setting to dry between skin folds. Do not wear tight clothes. Wear clothes that: Are loose. Take moisture away from your body. Are made of cotton. Wear a bra  that gives good support, if needed. Protect the skin in your groin and butt area as told by your doctor. To do this: Follow a regular cleaning routine. Use creams, powders, or ointments that protect your skin. Change protection pads often. Stay at a healthy weight. Take care of your feet. This is very important if you have diabetes. You should: Wear shoes that fit well. Keep your feet dry. Wear clean cotton or wool socks. Keep your blood sugar under control if you have diabetes. Contact a doctor if: Your symptoms do not get better with treatment. Your symptoms get worse or they spread. You notice more redness and warmth. You have a fever. This information is not intended to replace advice given to you by your health care provider. Make sure you discuss any questions you have with your health care provider. Document Revised: 04/24/2022 Document Reviewed: 04/24/2022 Elsevier Patient Education  2024 Elsevier Inc.   Upper Respiratory Infection, Adult An upper respiratory infection (URI) affects the nose, throat, and upper airways that lead to the lungs. The most common type of URI is often called the common cold. URIs usually get better on their own, without medical treatment. What are the causes? A URI is caused by a germ (virus). You may catch these germs by: Breathing in droplets from an infected person's cough or sneeze. Touching something that has the germ on it (is contaminated) and then touching your mouth, nose, or eyes. What increases the risk? You are more likely to get a URI if: You are very young or very old. You have close contact with others, such as at work, school, or a health care facility. You smoke. You have long-term (chronic) heart or lung disease. You have a weakened disease-fighting system (immune system). You have nasal allergies or asthma. You have a lot of stress. You have poor nutrition. What are the signs or symptoms? Runny or stuffy (congested)  nose. Cough. Sneezing. Sore throat. Headache. Feeling tired (  fatigue). Fever. Not wanting to eat as much as usual. Pain in your forehead, behind your eyes, and over your cheekbones (sinus pain). Muscle aches. Redness or irritation of the eyes. Pressure in the ears or face. How is this treated? URIs usually get better on their own within 7-10 days. Medicines cannot cure URIs, but your doctor may recommend certain medicines to help relieve symptoms, such as: Over-the-counter cold medicines. Medicines to reduce coughing (cough suppressants). Coughing is a type of defense against infection that helps to clear the nose, throat, windpipe, and lungs (respiratory system). Take these medicines only as told by your doctor. Medicines to lower your fever. Follow these instructions at home: Activity Rest as needed. If you have a fever, stay home from work or school until your fever is gone, or until your doctor says you may return to work or school. You should stay home until you cannot spread the infection anymore (you are not contagious). Your doctor may have you wear a face mask so you have less risk of spreading the infection. Relieving symptoms Rinse your mouth often with salt water. To make salt water, dissolve -1 tsp (3-6 g) of salt in 1 cup (237 mL) of warm water. Use a cool-mist humidifier to add moisture to the air. This can help you breathe more easily. Eating and drinking  Drink enough fluid to keep your pee (urine) pale yellow. Eat soups and other clear broths. General instructions  Take over-the-counter and prescription medicines only as told by your doctor. Do not smoke or use any products that contain nicotine or tobacco. If you need help quitting, ask your doctor. Avoid being where people are smoking (avoid secondhand smoke). Stay up to date on all your shots (immunizations), and get the flu shot every year. Keep all follow-up visits. How to prevent the spread of infection  to others  Wash your hands with soap and water for at least 20 seconds. If you cannot use soap and water, use hand sanitizer. Avoid touching your mouth, face, eyes, or nose. Cough or sneeze into a tissue or your sleeve or elbow. Do not cough or sneeze into your hand or into the air. Contact a doctor if: You are getting worse, not better. You have any of these: A fever or chills. Brown or red mucus in your nose. Yellow or brown fluid (discharge)coming from your nose. Pain in your face, especially when you bend forward. Swollen neck glands. Pain when you swallow. White areas in the back of your throat. Get help right away if: You have shortness of breath that gets worse. You have very bad or constant: Headache. Ear pain. Pain in your forehead, behind your eyes, and over your cheekbones (sinus pain). Chest pain. You have long-lasting (chronic) lung disease along with any of these: Making high-pitched whistling sounds when you breathe, most often when you breathe out (wheezing). Long-lasting cough (more than 14 days). Coughing up blood. A change in your usual mucus. You have a stiff neck. You have changes in your: Vision. Hearing. Thinking. Mood. These symptoms may be an emergency. Get help right away. Call 911. Do not wait to see if the symptoms will go away. Do not drive yourself to the hospital. Summary An upper respiratory infection (URI) is caused by a germ (virus). The most common type of URI is often called the common cold. URIs usually get better within 7-10 days. Take over-the-counter and prescription medicines only as told by your doctor. This information is not intended to  replace advice given to you by your health care provider. Make sure you discuss any questions you have with your health care provider. Document Revised: 07/03/2021 Document Reviewed: 07/03/2021 Elsevier Patient Education  2024 Elsevier Inc.    If you have been instructed to have an in-person  evaluation today at a local Urgent Care facility, please use the link below. It will take you to a list of all of our available [facility], including address, phone number and hours of operation. Please do not delay care.  [facility]  If you or a family member do not have a primary care provider, use the link below to schedule a visit and establish care. When you choose a [facility] primary care physician or advanced practice provider, you gain a long-term partner in health. Find a Primary Care Provider  Learn more about [facility]'s in-office and virtual care options: [facility] - Get Care Now

## 2023-06-15 ENCOUNTER — Telehealth: Payer: Medicare HMO | Admitting: Family Medicine

## 2023-06-15 DIAGNOSIS — R053 Chronic cough: Secondary | ICD-10-CM

## 2023-06-15 NOTE — Progress Notes (Signed)
Virtual Visit Consent   [name], you are scheduled for a virtual visit with a [facility] provider today. Just as with appointments in the office, your consent must be obtained to participate. Your consent will be active for this visit and any virtual visit you may have with one of our providers in the next 365 days. If you have a MyChart account, a copy of this consent can be sent to you electronically.  As this is a virtual visit, video technology does not allow for your provider to perform a traditional examination. This may limit your provider's ability to fully assess your condition. If your provider identifies any concerns that need to be evaluated in person or the need to arrange testing (such as labs, EKG, etc.), we will make arrangements to do so. Although advances in technology are sophisticated, we cannot ensure that it will always work on either your end or our end. If the connection with a video visit is poor, the visit may have to be switched to a telephone visit. With either a video or telephone visit, we are not always able to ensure that we have a secure connection.  By engaging in this virtual visit, you consent to the provision of healthcare and authorize for your insurance to be billed (if applicable) for the services provided during this visit. Depending on your insurance coverage, you may receive a charge related to this service.  I need to obtain your verbal consent now. Are you willing to proceed with your visit today? [name] has provided verbal consent on 06/15/2023 for a virtual visit (video or telephone). Nichole Finner, NP  Date: 06/15/2023 11:38 AM  Virtual Visit via Video Note   I, [name], connected with  [name]  ([id number], May 05, 1986) on 06/15/23 at 11:30 AM EDT by a video-enabled telemedicine application and verified that I am speaking with the correct person using two identifiers.  Location: Patient: Virtual Visit Location  Patient: Home Provider: Virtual Visit Location Provider: Home Office   I discussed the limitations of evaluation and management by telemedicine and the availability of in person appointments. The patient expressed understanding and agreed to proceed.    History of Present Illness: [name] is a 37 y.o. who identifies as a female who was assigned female at birth, and is being seen today for cough  Recently Dx with Viral URI and cough, asthma, 06/10/23 see note Reports today on going cough with a crackle Needs to be seen in person for eval and follow up for limited improvement of symptoms.   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  Methylphenidate Derivatives Other (See Comments)    Reaction:  Depression and anger    Neurontin [Gabapentin] Other (See Comments)    Reaction:  Dizziness    Prozac [Fluoxetine Hcl] Other (See Comments)    Reaction:  Anger    Methylphenidate Hcl     SAME AS PREVIOUS LISTED METHYLPHENIDATE   Medications:  Current Outpatient Medications:    acetaminophen  (TYLENOL) 500 MG tablet, Take 2 tablets (1,000 mg total) by mouth every 8 (eight) hours as needed (pain)., Disp: 60 tablet, Rfl: 0   albuterol (VENTOLIN HFA) 108 (90 Base) MCG/ACT inhaler, Inhale 1-2 puffs into the lungs every 6 (six) hours as needed., Disp: 8 g, Rfl: 0   ALPRAZolam (XANAX) 1 MG tablet, Take 1 mg by mouth daily as needed., Disp: , Rfl:    ARIPiprazole (ABILIFY IM), Inject 300 mg into the muscle every 30 (thirty) days., Disp: , Rfl:    Blood Pressure Monitoring (BLOOD PRESSURE KIT) DEVI, 1 Device by Does not apply route once a week., Disp: 1 each, Rfl: 0   brompheniramine-pseudoephedrine-DM 30-2-10 MG/5ML syrup, Take 5 mLs by mouth 4 (four) times daily as needed., Disp: 120 mL, Rfl: 0   chlorproMAZINE (THORAZINE) 25 MG tablet, Take 25 mg by mouth at bedtime., Disp: , Rfl:    docusate sodium (COLACE) 100 MG capsule, Take 1 capsule (100 mg total) by mouth 2 (two) times daily., Disp: 30 capsule, Rfl: 0   fluconazole (DIFLUCAN) 150 MG tablet, Take 1 tablet (150 mg total) by mouth once a week., Disp: 4 tablet, Rfl: 0   fluticasone (FLONASE) 50 MCG/ACT nasal spray, Place 2 sprays into both nostrils daily., Disp: 16 g, Rfl: 1   fluticasone-salmeterol (ADVAIR HFA) 115-21 MCG/ACT inhaler, Inhale 2 puffs into the lungs 2 (two) times daily., Disp: 1 each, Rfl: 1   ibuprofen (ADVIL) 800 MG tablet, Take 1 tablet (800 mg total) by mouth every 8 (eight) hours as needed., Disp: 30 tablet, Rfl: 0   levocetirizine (XYZAL) 5 MG tablet, Take 1 tablet (5 mg total) by mouth every evening., Disp: 30 tablet, Rfl: 0   montelukast (SINGULAIR) 10 MG tablet, Take 1 tablet (10 mg total) by mouth at bedtime., Disp: 30 tablet, Rfl: 3   nystatin (MYCOSTATIN/NYSTOP) powder, Apply 1 Application topically 3 (three) times daily., Disp: 15 g, Rfl: 0   nystatin (MYCOSTATIN/NYSTOP) powder, Apply 1 Application topically 3 (three) times daily., Disp: 60 g, Rfl: 0   nystatin cream (MYCOSTATIN), Apply 1 Application topically 2  (two) times daily., Disp: 30 g, Rfl: 0   nystatin-triamcinolone ointment (MYCOLOG), Apply 1 Application topically 2 (two) times daily., Disp: 30 g, Rfl: 0   ondansetron (ZOFRAN-ODT) 4 MG disintegrating tablet, Take 1 tablet (4 mg total) by mouth every 8 (eight) hours as needed., Disp: 20 tablet, Rfl: 0   pantoprazole (PROTONIX) 40 MG tablet, Take 1 tablet (40 mg total) by mouth daily., Disp: 30 tablet, Rfl: 0   predniSONE (DELTASONE) 20 MG tablet, Take 2 tablets (40 mg total) by mouth daily with breakfast., Disp: 10 tablet, Rfl: 0   propranolol (INDERAL) 20 MG tablet, Take by mouth., Disp: , Rfl:    VRAYLAR 1.5 MG capsule, Take 1.5 mg by mouth daily., Disp: , Rfl:   Observations/Objective: Patient is well-developed, well-nourished in no acute distress.  Resting comfortably  at home.  Head is normocephalic, atraumatic.  No labored breathing.  Speech is clear and coherent with logical content.  Patient is alert and oriented at baseline.  Assessment and Plan:  1. Chronic cough  -needs in person eval and treatment- has been seen seeral times for asthma and or a cough over the last few months. Several courses of pred have been provided as well.   Patient acknowledged agreement and understanding of the plan.    Follow Up Instructions: I discussed the assessment and treatment plan with the patient. The patient was provided an opportunity to ask questions and all were answered. The patient agreed with the plan and demonstrated an understanding of the instructions.  A copy of instructions were sent to the patient via MyChart unless otherwise noted below.     The patient was advised to call back or seek an in-person evaluation if the symptoms worsen or if the condition fails to improve as anticipated.  Time:  I spent 3 minutes with the patient via telehealth technology discussing the above problems/concerns.    Nichole Finner, NP

## 2023-06-25 DIAGNOSIS — F3162 Bipolar disorder, current episode mixed, moderate: Secondary | ICD-10-CM | POA: Diagnosis not present

## 2023-06-25 DIAGNOSIS — F418 Other specified anxiety disorders: Secondary | ICD-10-CM | POA: Diagnosis not present

## 2023-07-16 ENCOUNTER — Telehealth: Payer: Medicare HMO | Admitting: Physician Assistant

## 2023-07-16 DIAGNOSIS — J019 Acute sinusitis, unspecified: Secondary | ICD-10-CM

## 2023-07-16 DIAGNOSIS — B9689 Other specified bacterial agents as the cause of diseases classified elsewhere: Secondary | ICD-10-CM

## 2023-07-16 MED ORDER — FLUTICASONE PROPIONATE 50 MCG/ACT NA SUSP: 2.0000 | Freq: Every day | NASAL | 0 refills | Status: DC

## 2023-07-16 MED ORDER — DOXYCYCLINE HYCLATE 100 MG PO TABS
100.0000 mg | ORAL_TABLET | Freq: Two times a day (BID) | ORAL | 0 refills | Status: DC
Start: 2023-07-16 — End: 2023-11-16

## 2023-07-16 NOTE — Patient Instructions (Signed)
[name], thank you for joining [name], PA-C for today's virtual visit.  While this provider is not your primary care provider (PCP), if your PCP is located in our provider database this encounter information will be shared with them immediately following your visit.   A [facility] MyChart account gives you access to today's visit and all your visits, tests, and labs performed at [facility] " click here if you don't have a [facility] MyChart account or go to [email or web address]  Consent: (Patient) [name] provided verbal consent for this virtual visit at the beginning of the encounter.  Current Medications:  Current Outpatient Medications:    acetaminophen (TYLENOL) 500 MG tablet, Take 2 tablets (1,000 mg total) by mouth every 8 (eight) hours as needed (pain)., Disp: 60 tablet, Rfl: 0   albuterol (VENTOLIN HFA) 108 (90 Base) MCG/ACT inhaler, Inhale 1-2 puffs into the lungs every 6 (six) hours as needed., Disp: 8 g, Rfl: 0   ALPRAZolam (XANAX) 1 MG tablet, Take 1 mg by mouth daily as needed., Disp: , Rfl:    ARIPiprazole (ABILIFY IM), Inject 300 mg into the muscle every 30 (thirty) days., Disp: , Rfl:    Blood Pressure Monitoring (BLOOD PRESSURE KIT) DEVI, 1 Device by Does not apply route once a week., Disp: 1 each, Rfl: 0   brompheniramine-pseudoephedrine-DM 30-2-10 MG/5ML syrup, Take 5 mLs by mouth 4 (four) times daily as needed., Disp: 120 mL, Rfl: 0   chlorproMAZINE (THORAZINE) 25 MG tablet, Take 25 mg by mouth at bedtime., Disp: , Rfl:    docusate sodium (COLACE) 100 MG capsule, Take 1 capsule (100 mg total) by mouth 2 (two) times daily., Disp: 30 capsule, Rfl: 0   fluconazole (DIFLUCAN) 150 MG tablet, Take 1 tablet (150 mg total) by mouth once a week., Disp: 4 tablet, Rfl: 0   fluticasone (FLONASE) 50 MCG/ACT nasal spray, Place 2 sprays into both nostrils daily., Disp: 16 g, Rfl: 1   fluticasone-salmeterol (ADVAIR HFA) 115-21 MCG/ACT  inhaler, Inhale 2 puffs into the lungs 2 (two) times daily., Disp: 1 each, Rfl: 1   ibuprofen (ADVIL) 800 MG tablet, Take 1 tablet (800 mg total) by mouth every 8 (eight) hours as needed., Disp: 30 tablet, Rfl: 0   levocetirizine (XYZAL) 5 MG tablet, Take 1 tablet (5 mg total) by mouth every evening., Disp: 30 tablet, Rfl: 0   montelukast (SINGULAIR) 10 MG tablet, Take 1 tablet (10 mg total) by mouth at bedtime., Disp: 30 tablet, Rfl: 3   nystatin (MYCOSTATIN/NYSTOP) powder, Apply 1 Application topically 3 (three) times daily., Disp: 15 g, Rfl: 0   nystatin (MYCOSTATIN/NYSTOP) powder, Apply 1 Application topically 3 (three) times daily., Disp: 60 g, Rfl: 0   nystatin cream (MYCOSTATIN), Apply 1 Application topically 2 (two) times daily., Disp: 30 g, Rfl: 0   nystatin-triamcinolone ointment (MYCOLOG), Apply 1 Application topically 2 (two) times daily., Disp: 30 g, Rfl: 0   ondansetron (ZOFRAN-ODT) 4 MG disintegrating tablet, Take 1 tablet (4 mg total) by mouth every 8 (eight) hours as needed., Disp: 20 tablet, Rfl: 0   pantoprazole (PROTONIX) 40 MG tablet, Take 1 tablet (40 mg total) by mouth daily., Disp: 30 tablet, Rfl: 0   predniSONE (DELTASONE) 20 MG tablet, Take 2 tablets (40 mg total) by mouth daily with breakfast., Disp: 10 tablet, Rfl: 0   propranolol (INDERAL) 20 MG tablet, Take by mouth., Disp: , Rfl:    VRAYLAR 1.5 MG capsule, Take 1.5 mg by mouth  daily., Disp: , Rfl:    Medications ordered in this encounter:  No orders of the defined types were placed in this encounter.    *If you need refills on other medications prior to your next appointment, please contact your pharmacy*  Follow-Up: Call back or seek an in-person evaluation if the symptoms worsen or if the condition fails to improve as anticipated.  [facility] [phone]  Other Instructions Please take antibiotic as directed.  Increase fluid intake.  Use Saline nasal spray.  Take a daily multivitamin.  Continue your asthma medications. I have refilled your Flonase to be used as directed.  Place a humidifier in the bedroom.  Please call or return clinic if symptoms are not improving.  Sinusitis Sinusitis is redness, soreness, and swelling (inflammation) of the paranasal sinuses. Paranasal sinuses are air pockets within the bones of your face (beneath the eyes, the middle of the forehead, or above the eyes). In healthy paranasal sinuses, mucus is able to drain out, and air is able to circulate through them by way of your nose. However, when your paranasal sinuses are inflamed, mucus and air can become trapped. This can allow bacteria and other germs to grow and cause infection. Sinusitis can develop quickly and last only a short time (acute) or continue over a long period (chronic). Sinusitis that lasts for more than 12 weeks is considered chronic.  CAUSES  Causes of sinusitis include: Allergies. Structural abnormalities, such as displacement of the cartilage that separates your nostrils (deviated septum), which can decrease the air flow through your nose and sinuses and affect sinus drainage. Functional abnormalities, such as when the small hairs (cilia) that line your sinuses and help remove mucus do not work properly or are not present. SYMPTOMS  Symptoms of acute and chronic sinusitis are the same. The primary symptoms are pain and pressure around the affected sinuses. Other symptoms include: Upper toothache. Earache. Headache. Bad breath. Decreased sense of smell and taste. A cough, which worsens when you are lying flat. Fatigue. Fever. Thick drainage from your nose, which often is green and may contain pus (purulent). Swelling and warmth over the affected sinuses. DIAGNOSIS  Your caregiver will perform a physical exam. During the exam, your caregiver may: Look in your nose for signs of abnormal growths in your nostrils (nasal polyps). Tap over the affected sinus to check for signs of  infection. View the inside of your sinuses (endoscopy) with a special imaging device with a light attached (endoscope), which is inserted into your sinuses. If your caregiver suspects that you have chronic sinusitis, one or more of the following tests may be recommended: Allergy tests. Nasal culture A sample of mucus is taken from your nose and sent to a lab and screened for bacteria. Nasal cytology A sample of mucus is taken from your nose and examined by your caregiver to determine if your sinusitis is related to an allergy. TREATMENT  Most cases of acute sinusitis are related to a viral infection and will resolve on their own within 10 days. Sometimes medicines are prescribed to help relieve symptoms (pain medicine, decongestants, nasal steroid sprays, or saline sprays).  However, for sinusitis related to a bacterial infection, your caregiver will prescribe antibiotic medicines. These are medicines that will help kill the bacteria causing the infection.  Rarely, sinusitis is caused by a fungal infection. In theses cases, your caregiver will prescribe antifungal medicine. For some cases of chronic sinusitis, surgery is needed. Generally, these are cases in  which sinusitis recurs more than 3 times per year, despite other treatments. HOME CARE INSTRUCTIONS  Drink plenty of water. Water helps thin the mucus so your sinuses can drain more easily. Use a humidifier. Inhale steam 3 to 4 times a day (for example, sit in the bathroom with the shower running). Apply a warm, moist washcloth to your face 3 to 4 times a day, or as directed by your caregiver. Use saline nasal sprays to help moisten and clean your sinuses. Take over-the-counter or prescription medicines for pain, discomfort, or fever only as directed by your caregiver. SEEK IMMEDIATE MEDICAL CARE IF: You have increasing pain or severe headaches. You have nausea, vomiting, or drowsiness. You have swelling around your face. You have vision  problems. You have a stiff neck. You have difficulty breathing. MAKE SURE YOU:  Understand these instructions. Will watch your condition. Will get help right away if you are not doing well or get worse. Document Released: 12/01/2005 Document Revised: 02/23/2012 Document Reviewed: 12/16/2011 [facility] Patient Information 2014 [place], [place].    If you have been instructed to have an in-person evaluation today at a local Urgent Care facility, please use the link below. It will take you to a list of all of our available [facility], including address, phone number and hours of operation. Please do not delay care.  [facility]  If you or a family member do not have a primary care provider, use the link below to schedule a visit and establish care. When you choose a [facility] primary care physician or advanced practice provider, you gain a long-term partner in health. Find a Primary Care Provider  Learn more about [facility]'s in-office and virtual care options: [facility] - Get Care Now

## 2023-07-16 NOTE — Progress Notes (Signed)
Virtual Visit Consent   [name], you are scheduled for a virtual visit with a [facility] provider today. Just as with appointments in the office, your consent must be obtained to participate. Your consent will be active for this visit and any virtual visit you may have with one of our providers in the next 365 days. If you have a MyChart account, a copy of this consent can be sent to you electronically.  As this is a virtual visit, video technology does not allow for your provider to perform a traditional examination. This may limit your provider's ability to fully assess your condition. If your provider identifies any concerns that need to be evaluated in person or the need to arrange testing (such as labs, EKG, etc.), we will make arrangements to do so. Although advances in technology are sophisticated, we cannot ensure that it will always work on either your end or our end. If the connection with a video visit is poor, the visit may have to be switched to a telephone visit. With either a video or telephone visit, we are not always able to ensure that we have a secure connection.  By engaging in this virtual visit, you consent to the provision of healthcare and authorize for your insurance to be billed (if applicable) for the services provided during this visit. Depending on your insurance coverage, you may receive a charge related to this service.  I need to obtain your verbal consent now. Are you willing to proceed with your visit today? [name] has provided verbal consent on 07/16/2023 for a virtual visit (video or telephone). [name], [name]  Date: 07/16/2023 3:39 PM  Virtual Visit via Video Note   I, [name], connected with  [name]  ([id number], 11-Jul-1986) on 07/16/23 at  3:30 PM EDT by a video-enabled telemedicine application and verified that I am speaking with the correct person using two identifiers.  Location: Patient: Virtual Visit  Location Patient: Home Provider: Virtual Visit Location Provider: Home Office   I discussed the limitations of evaluation and management by telemedicine and the availability of in person appointments. The patient expressed understanding and agreed to proceed.    History of Present Illness: [name] is a 37 y.o. who identifies as a female who was assigned female at birth, and is being seen today for URI symptoms starting this past week with nasal and head congestion, sinus pressure, headache with some associated chest tightness. Now with low-grade fever, sore throat and sinus pain.  Kids sick recently with similar illnesses.  Has taken a home COVID test which was negative.   OTC -- Tylenol, ASA.   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     Methylphenidate Derivatives Other (See Comments)    Reaction:  Depression and anger    Neurontin [Gabapentin] Other (See Comments)    Reaction:  Dizziness    Prozac [Fluoxetine Hcl] Other (See Comments)    Reaction:  Anger    Methylphenidate  Hcl     SAME AS PREVIOUS LISTED METHYLPHENIDATE   Medications:  Current Outpatient Medications:    doxycycline (VIBRA-TABS) 100 MG tablet, Take 1 tablet (100 mg total) by mouth 2 (two) times daily., Disp: 14 tablet, Rfl: 0   fluticasone (FLONASE) 50 MCG/ACT nasal spray, Place 2 sprays into both nostrils daily., Disp: 16 g, Rfl: 0   acetaminophen (TYLENOL) 500 MG tablet, Take 2 tablets (1,000 mg total) by mouth every 8 (eight) hours as needed (pain)., Disp: 60 tablet, Rfl: 0   albuterol (VENTOLIN HFA) 108 (90 Base) MCG/ACT inhaler, Inhale 1-2 puffs into the lungs every 6 (six) hours as needed., Disp: 8 g, Rfl: 0   ALPRAZolam (XANAX) 1 MG tablet, Take 1 mg by mouth daily as needed., Disp: , Rfl:    ARIPiprazole (ABILIFY IM), Inject 300 mg into the muscle every 30 (thirty) days., Disp: , Rfl:    Blood Pressure Monitoring (BLOOD PRESSURE KIT) DEVI, 1 Device by Does not apply route once a week., Disp: 1 each, Rfl: 0   brompheniramine-pseudoephedrine-DM 30-2-10 MG/5ML syrup, Take 5 mLs by mouth 4 (four) times daily as needed., Disp: 120 mL, Rfl: 0   chlorproMAZINE (THORAZINE) 25 MG tablet, Take 25 mg by mouth at bedtime., Disp: , Rfl:    docusate sodium (COLACE) 100 MG capsule, Take 1 capsule (100 mg total) by mouth 2 (two) times daily., Disp: 30 capsule, Rfl: 0   fluconazole (DIFLUCAN) 150 MG tablet, Take 1 tablet (150 mg total) by mouth once a week., Disp: 4 tablet, Rfl: 0   fluticasone-salmeterol (ADVAIR HFA) 115-21 MCG/ACT inhaler, Inhale 2 puffs into the lungs 2 (two) times daily., Disp: 1 each, Rfl: 1   ibuprofen (ADVIL) 800 MG tablet, Take 1 tablet (800 mg total) by mouth every 8 (eight) hours as needed., Disp: 30 tablet, Rfl: 0   levocetirizine (XYZAL) 5 MG tablet, Take 1 tablet (5 mg total) by mouth every evening., Disp: 30 tablet, Rfl: 0   montelukast (SINGULAIR) 10 MG tablet, Take 1 tablet (10 mg total) by mouth at bedtime., Disp: 30 tablet, Rfl: 3   nystatin (MYCOSTATIN/NYSTOP) powder,  Apply 1 Application topically 3 (three) times daily., Disp: 15 g, Rfl: 0   nystatin (MYCOSTATIN/NYSTOP) powder, Apply 1 Application topically 3 (three) times daily., Disp: 60 g, Rfl: 0   nystatin cream (MYCOSTATIN), Apply 1 Application topically 2 (two) times daily., Disp: 30 g, Rfl: 0   nystatin-triamcinolone ointment (MYCOLOG), Apply 1 Application topically 2 (two) times daily., Disp: 30 g, Rfl: 0   ondansetron (ZOFRAN-ODT) 4 MG disintegrating tablet, Take 1 tablet (4 mg total) by mouth every 8 (eight) hours as needed., Disp: 20 tablet, Rfl: 0   pantoprazole (PROTONIX) 40 MG tablet, Take 1 tablet (40 mg total) by mouth daily., Disp: 30 tablet, Rfl: 0   propranolol (INDERAL) 20 MG tablet, Take by mouth., Disp: , Rfl:    VRAYLAR 1.5 MG capsule, Take 1.5 mg by mouth daily., Disp: , Rfl:   Observations/Objective: Patient is well-developed, well-nourished in no acute distress.  Resting comfortably at home.  Head is normocephalic, atraumatic.  No  labored breathing. Speech is clear and coherent with logical content.  Patient is alert and oriented at baseline.   Assessment and Plan: 1. Acute bacterial sinusitis - doxycycline (VIBRA-TABS) 100 MG tablet; Take 1 tablet (100 mg total) by mouth 2 (two) times daily.  Dispense: 14 tablet; Refill: 0 - fluticasone (FLONASE) 50 MCG/ACT nasal spray; Place 2 sprays into both nostrils daily.  Dispense: 16 g; Refill: 0  Rx Doxycycline.  Increase fluids.  Rest.  Saline nasal spray.  Probiotic.  Mucinex as directed.  Humidifier in bedroom. Flonase refilled. Continue asthma medications.  Call or return to clinic if symptoms are not improving.    Follow Up Instructions: I discussed the assessment and treatment plan with the patient. The patient was provided an opportunity to ask questions and all were answered. The patient agreed with the plan and demonstrated an understanding of the instructions.  A copy of instructions were sent to the patient via MyChart  unless otherwise noted below.   The patient was advised to call back or seek an in-person evaluation if the symptoms worsen or if the condition fails to improve as anticipated.  Time:  I spent 10 minutes with the patient via telehealth technology discussing the above problems/concerns.    Nichole Climes, PA-C

## 2023-07-20 DIAGNOSIS — F3162 Bipolar disorder, current episode mixed, moderate: Secondary | ICD-10-CM | POA: Diagnosis not present

## 2023-07-20 DIAGNOSIS — F418 Other specified anxiety disorders: Secondary | ICD-10-CM | POA: Diagnosis not present

## 2023-08-21 ENCOUNTER — Telehealth: Payer: Medicare HMO | Admitting: Physician Assistant

## 2023-08-21 DIAGNOSIS — L304 Erythema intertrigo: Secondary | ICD-10-CM

## 2023-08-21 DIAGNOSIS — B372 Candidiasis of skin and nail: Secondary | ICD-10-CM | POA: Diagnosis not present

## 2023-08-21 MED ORDER — NYSTATIN 100000 UNIT/GM EX CREA
1.0000 | TOPICAL_CREAM | Freq: Two times a day (BID) | CUTANEOUS | 1 refills | Status: DC
Start: 2023-08-21 — End: 2024-03-01

## 2023-08-21 MED ORDER — NYSTATIN 100000 UNIT/GM EX POWD: 1.0000 | Freq: Three times a day (TID) | CUTANEOUS | 0 refills | Status: DC

## 2023-08-21 NOTE — Progress Notes (Signed)
Virtual Visit Consent   [name], you are scheduled for a virtual visit with a [facility] provider today. Just as with appointments in the office, your consent must be obtained to participate. Your consent will be active for this visit and any virtual visit you may have with one of our providers in the next 365 days. If you have a MyChart account, a copy of this consent can be sent to you electronically.  As this is a virtual visit, video technology does not allow for your provider to perform a traditional examination. This may limit your provider's ability to fully assess your condition. If your provider identifies any concerns that need to be evaluated in person or the need to arrange testing (such as labs, EKG, etc.), we will make arrangements to do so. Although advances in technology are sophisticated, we cannot ensure that it will always work on either your end or our end. If the connection with a video visit is poor, the visit may have to be switched to a telephone visit. With either a video or telephone visit, we are not always able to ensure that we have a secure connection.  By engaging in this virtual visit, you consent to the provision of healthcare and authorize for your insurance to be billed (if applicable) for the services provided during this visit. Depending on your insurance coverage, you may receive a charge related to this service.  I need to obtain your verbal consent now. Are you willing to proceed with your visit today? [name] has provided verbal consent on 08/21/2023 for a virtual visit (video or telephone). [name], PA-C  Date: 08/21/2023 10:21 AM  Virtual Visit via Video Note   I, [name], connected with  [name]  ([id number], 1986-02-25) on 08/21/23 at 10:30 AM EDT by a video-enabled telemedicine application and verified that I am speaking with the correct person using two identifiers.  Location: Patient: Virtual Visit  Location Patient: Home Provider: Virtual Visit Location Provider: Home Office   I discussed the limitations of evaluation and management by telemedicine and the availability of in person appointments. The patient expressed understanding and agreed to proceed.    History of Present Illness: [name] is a 37 y.o. who identifies as a female who was assigned female at birth, and is being seen today for rash.  HPI: Rash This is a recurrent problem. The current episode started 1 to 4 weeks ago. The problem has been gradually worsening since onset. The affected locations include the groin and chest (under breast and in groin folds and inner thighs). The rash is characterized by redness, itchiness and burning. Associated with: Has used Fluconazole, Nystatin and Mycolog in the past.      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     Methylphenidate Derivatives Other (See Comments)    Reaction:  Depression and anger    Neurontin [Gabapentin] Other (See Comments)    Reaction:  Dizziness    Prozac  [Fluoxetine Hcl] Other (See Comments)    Reaction:  Anger    Methylphenidate Hcl     SAME AS PREVIOUS LISTED METHYLPHENIDATE   Medications:  Current Outpatient Medications:    acetaminophen (TYLENOL) 500 MG tablet, Take 2 tablets (1,000 mg total) by mouth every 8 (eight) hours as needed (pain)., Disp: 60 tablet, Rfl: 0   albuterol (VENTOLIN HFA) 108 (90 Base) MCG/ACT inhaler, Inhale 1-2 puffs into the lungs every 6 (six) hours as needed., Disp: 8 g, Rfl: 0   ALPRAZolam (XANAX) 1 MG tablet, Take 1 mg by mouth daily as needed., Disp: , Rfl:    ARIPiprazole (ABILIFY IM), Inject 300 mg into the muscle every 30 (thirty) days., Disp: , Rfl:    Blood Pressure Monitoring (BLOOD PRESSURE KIT) DEVI, 1 Device by Does not apply route once a week., Disp: 1 each, Rfl: 0   brompheniramine-pseudoephedrine-DM 30-2-10 MG/5ML syrup, Take 5 mLs by mouth 4 (four) times daily as needed., Disp: 120 mL, Rfl: 0   chlorproMAZINE (THORAZINE) 25 MG tablet, Take 25 mg by mouth at bedtime., Disp: , Rfl:    docusate sodium (COLACE) 100 MG capsule, Take 1 capsule (100 mg total) by mouth 2 (two) times daily., Disp: 30 capsule, Rfl: 0   doxycycline (VIBRA-TABS) 100 MG tablet, Take 1 tablet (100 mg total) by mouth 2 (two) times daily., Disp: 14 tablet, Rfl: 0   fluconazole (DIFLUCAN) 150 MG tablet, Take 1 tablet (150 mg total) by mouth once a week., Disp: 4 tablet, Rfl: 0   fluticasone (FLONASE) 50 MCG/ACT nasal spray, Place 2 sprays into both nostrils daily., Disp: 16 g, Rfl: 0   fluticasone-salmeterol (ADVAIR HFA) 115-21 MCG/ACT inhaler, Inhale 2 puffs into the lungs 2 (two) times daily., Disp: 1 each, Rfl: 1   ibuprofen (ADVIL) 800 MG tablet, Take 1 tablet (800 mg total) by mouth every 8 (eight) hours as needed., Disp: 30 tablet, Rfl: 0   levocetirizine (XYZAL) 5 MG tablet, Take 1 tablet (5 mg total) by mouth every evening., Disp: 30 tablet, Rfl: 0   montelukast (SINGULAIR) 10 MG tablet, Take 1 tablet (10 mg total) by mouth at  bedtime., Disp: 30 tablet, Rfl: 3   nystatin (MYCOSTATIN/NYSTOP) powder, Apply 1 Application topically 3 (three) times daily., Disp: 15 g, Rfl: 0   nystatin (MYCOSTATIN/NYSTOP) powder, Apply 1 Application topically 3 (three) times daily., Disp: 60 g, Rfl: 0   nystatin cream (MYCOSTATIN), Apply 1 Application topically 2 (two) times daily., Disp: 30 g, Rfl: 0   nystatin-triamcinolone ointment (MYCOLOG), Apply 1 Application topically 2 (two) times daily., Disp: 30 g, Rfl: 0   ondansetron (ZOFRAN-ODT) 4 MG disintegrating tablet, Take 1 tablet (4 mg total) by mouth every 8 (eight) hours as needed., Disp: 20 tablet, Rfl: 0   pantoprazole (PROTONIX) 40 MG tablet, Take 1 tablet (40 mg total) by mouth daily., Disp: 30 tablet, Rfl: 0   propranolol (INDERAL) 20 MG tablet, Take by mouth., Disp: , Rfl:    VRAYLAR 1.5 MG capsule, Take 1.5 mg by mouth daily., Disp: , Rfl:   Observations/Objective: Patient is well-developed, well-nourished in no acute distress.  Resting  comfortably at home.  Head is normocephalic, atraumatic.  No labored breathing.  Speech is clear and coherent with logical content.  Patient is alert and oriented at baseline.    Assessment and Plan: There are no diagnoses linked to this encounter. - Recurrent yeast infections of the skin in groin area and under breast. - Nystatin cream and powder prescribed - Keep skin clean and dry - Follow up in person if not improving or if symptoms worsen despite treatment  Follow Up Instructions: I discussed the assessment and treatment plan with the patient. The patient was provided an opportunity to ask questions and all were answered. The patient agreed with the plan and demonstrated an understanding of the instructions.  A copy of instructions were sent to the patient via MyChart unless otherwise noted below.    The patient was advised to call back or seek an in-person evaluation if the symptoms worsen or if the condition fails to improve as  anticipated.  Time:  I spent 8 minutes with the patient via telehealth technology discussing the above problems/concerns.    [name], PA-C

## 2023-08-21 NOTE — Patient Instructions (Signed)
[name], thank you for joining Nichole Loveless, PA-C for today's virtual visit.  While this provider is not your primary care provider (PCP), if your PCP is located in our provider database this encounter information will be shared with them immediately following your visit.   A [facility] MyChart account gives you access to today's visit and all your visits, tests, and labs performed at [facility] " click here if you don't have a [facility] MyChart account or go to [email or web address]  Consent: (Patient) [name] provided verbal consent for this virtual visit at the beginning of the encounter.  Current Medications:  Current Outpatient Medications:    acetaminophen (TYLENOL) 500 MG tablet, Take 2 tablets (1,000 mg total) by mouth every 8 (eight) hours as needed (pain)., Disp: 60 tablet, Rfl: 0   albuterol (VENTOLIN HFA) 108 (90 Base) MCG/ACT inhaler, Inhale 1-2 puffs into the lungs every 6 (six) hours as needed., Disp: 8 g, Rfl: 0   ALPRAZolam (XANAX) 1 MG tablet, Take 1 mg by mouth daily as needed., Disp: , Rfl:    ARIPiprazole (ABILIFY IM), Inject 300 mg into the muscle every 30 (thirty) days., Disp: , Rfl:    Blood Pressure Monitoring (BLOOD PRESSURE KIT) DEVI, 1 Device by Does not apply route once a week., Disp: 1 each, Rfl: 0   brompheniramine-pseudoephedrine-DM 30-2-10 MG/5ML syrup, Take 5 mLs by mouth 4 (four) times daily as needed., Disp: 120 mL, Rfl: 0   chlorproMAZINE (THORAZINE) 25 MG tablet, Take 25 mg by mouth at bedtime., Disp: , Rfl:    docusate sodium (COLACE) 100 MG capsule, Take 1 capsule (100 mg total) by mouth 2 (two) times daily., Disp: 30 capsule, Rfl: 0   doxycycline (VIBRA-TABS) 100 MG tablet, Take 1 tablet (100 mg total) by mouth 2 (two) times daily., Disp: 14 tablet, Rfl: 0   fluconazole (DIFLUCAN) 150 MG tablet, Take 1 tablet (150 mg total) by mouth once a week., Disp: 4 tablet, Rfl: 0   fluticasone (FLONASE) 50 MCG/ACT  nasal spray, Place 2 sprays into both nostrils daily., Disp: 16 g, Rfl: 0   fluticasone-salmeterol (ADVAIR HFA) 115-21 MCG/ACT inhaler, Inhale 2 puffs into the lungs 2 (two) times daily., Disp: 1 each, Rfl: 1   ibuprofen (ADVIL) 800 MG tablet, Take 1 tablet (800 mg total) by mouth every 8 (eight) hours as needed., Disp: 30 tablet, Rfl: 0   levocetirizine (XYZAL) 5 MG tablet, Take 1 tablet (5 mg total) by mouth every evening., Disp: 30 tablet, Rfl: 0   montelukast (SINGULAIR) 10 MG tablet, Take 1 tablet (10 mg total) by mouth at bedtime., Disp: 30 tablet, Rfl: 3   nystatin (MYCOSTATIN/NYSTOP) powder, Apply 1 Application topically 3 (three) times daily., Disp: 60 g, Rfl: 0   nystatin cream (MYCOSTATIN), Apply 1 Application topically 2 (two) times daily., Disp: 30 g, Rfl: 1   nystatin-triamcinolone ointment (MYCOLOG), Apply 1 Application topically 2 (two) times daily., Disp: 30 g, Rfl: 0   ondansetron (ZOFRAN-ODT) 4 MG disintegrating tablet, Take 1 tablet (4 mg total) by mouth every 8 (eight) hours as needed., Disp: 20 tablet, Rfl: 0   pantoprazole (PROTONIX) 40 MG tablet, Take 1 tablet (40 mg total) by mouth daily., Disp: 30 tablet, Rfl: 0   propranolol (INDERAL) 20 MG tablet, Take by mouth., Disp: , Rfl:    VRAYLAR 1.5 MG capsule, Take 1.5 mg by mouth daily., Disp: , Rfl:    Medications ordered in this encounter:  Meds ordered this encounter  Medications   nystatin (MYCOSTATIN/NYSTOP) powder    Sig: Apply 1 Application topically 3 (three) times daily.    Dispense:  60 g    Refill:  0    Order Specific Question:   Supervising Provider    Answer:   Merrilee Jansky [[id number]]   nystatin cream (MYCOSTATIN)    Sig: Apply 1 Application topically 2 (two) times daily.    Dispense:  30 g    Refill:  1    Order Specific Question:   Supervising Provider    Answer:   Merrilee Jansky [id number]     *If you need refills on other medications prior to your next appointment, please contact your  pharmacy*  Follow-Up: Call back or seek an in-person evaluation if the symptoms worsen or if the condition fails to improve as anticipated.  [facility] [phone]  Other Instructions Skin Yeast Infection  A skin yeast infection is a condition in which there is an overgrowth of yeast (Candida) that normally lives on the skin. This condition usually occurs in areas of the skin that are constantly warm and moist, such as the skin under the breasts or armpits, or in the groin and other body folds. What are the causes? This condition is caused by a change in the normal balance of the yeast that live on the skin. What increases the risk? You are more likely to develop this condition if you: Are obese. Are pregnant. Are 71 years of age or older. Wear tight clothing. Have any of the following conditions: Diabetes. Malnutrition. A weak body defense system (immune system). Take medicines such as: Birth control pills. Antibiotics. Steroid medicines. What are the signs or symptoms? The most common symptom of this condition is itchiness in the affected area. Other symptoms include: A red, swollen area of the skin. Bumps on the skin. How is this diagnosed? This condition is diagnosed with a medical history and physical exam. Your health care provider may check for yeast by taking scrapings of the skin to be viewed under a microscope. How is this treated? This condition is treated with medicine. Medicines may be prescribed or available over the counter. The medicines may be: Taken by mouth (orally). Applied as a cream or powder to your skin. Follow these instructions at home:  Take or apply over-the-counter and prescription medicines only as told by your health care provider. Maintain a healthy weight. If you need help losing weight, talk with your health care provider. Keep your skin clean and dry. Wear loose-fitting clothing. If you have diabetes, keep your blood sugar  under control. Keep all follow-up visits. This is important. Contact a health care provider if: Your symptoms go away and then come back. Your symptoms do not get better with treatment. Your symptoms get worse. Your rash spreads. You have a fever or chills. You have new symptoms. You have new warmth or redness of your skin. Your rash is painful or bleeding. Summary A skin yeast infection is a condition in which there is an overgrowth of yeast (Candida) that normally lives on the skin. Take or apply over-the-counter and prescription medicines only as told by your health care provider. Keep your skin clean and dry. Contact a health care provider if your symptoms do not get better with treatment. This information is not intended to replace advice given to you by your health care provider. Make sure you discuss any questions you have with your health care provider. Document Revised: 02/19/2021  Document Reviewed: 02/19/2021 Elsevier Patient Education  2024 Elsevier Inc.    If you have been instructed to have an in-person evaluation today at a local Urgent Care facility, please use the link below. It will take you to a list of all of our available [place] [facility], including address, phone number and hours of operation. Please do not delay care.  [facility]  If you or a family member do not have a primary care provider, use the link below to schedule a visit and establish care. When you choose a [facility] primary care physician or advanced practice provider, you gain a long-term partner in health. Find a Primary Care Provider  Learn more about [facility]'s in-office and virtual care options: [place] - Get Care Now

## 2023-09-03 DIAGNOSIS — F3162 Bipolar disorder, current episode mixed, moderate: Secondary | ICD-10-CM | POA: Diagnosis not present

## 2023-09-03 DIAGNOSIS — F418 Other specified anxiety disorders: Secondary | ICD-10-CM | POA: Diagnosis not present

## 2023-09-16 ENCOUNTER — Telehealth: Payer: Medicare HMO | Admitting: Urgent Care

## 2023-09-16 DIAGNOSIS — J069 Acute upper respiratory infection, unspecified: Secondary | ICD-10-CM | POA: Diagnosis not present

## 2023-09-16 DIAGNOSIS — R110 Nausea: Secondary | ICD-10-CM

## 2023-09-16 DIAGNOSIS — N946 Dysmenorrhea, unspecified: Secondary | ICD-10-CM | POA: Diagnosis not present

## 2023-09-16 MED ORDER — IBUPROFEN 800 MG PO TABS: 800.0000 mg | ORAL_TABLET | Freq: Three times a day (TID) | ORAL | 0 refills | Status: DC | PRN

## 2023-09-16 MED ORDER — GUAIFENESIN 100 MG/5ML PO LIQD: 5.0000 mL | Freq: Four times a day (QID) | ORAL | 0 refills | Status: DC | PRN

## 2023-09-16 MED ORDER — MONTELUKAST SODIUM 10 MG PO TABS: 10.0000 mg | ORAL_TABLET | Freq: Every day | ORAL | 0 refills | Status: DC

## 2023-09-16 MED ORDER — ONDANSETRON 4 MG PO TBDP: 4.0000 mg | ORAL_TABLET | Freq: Three times a day (TID) | ORAL | 0 refills | Status: DC | PRN

## 2023-09-16 NOTE — Patient Instructions (Signed)
[employer], thank you for joining Maretta Bees, PA for today's virtual visit.  While this provider is not your primary care provider (PCP), if your PCP is located in our provider database this encounter information will be shared with them immediately following your visit.   A [facility] MyChart account gives you access to today's visit and all your visits, tests, and labs performed at [facility] " click here if you don't have a [facility] MyChart account or go to [email or web address]  Consent: (Patient) [name] provided verbal consent for this virtual visit at the beginning of the encounter.  Current Medications:  Current Outpatient Medications:    guaiFENesin (TUSSIN MUCUS & CHEST CONGEST) 100 MG/5ML liquid, Take 5 mLs by mouth every 6 (six) hours as needed for cough or to loosen phlegm., Disp: 120 mL, Rfl: 0   ibuprofen (ADVIL) 800 MG tablet, Take 1 tablet (800 mg total) by mouth every 8 (eight) hours as needed for cramping. Take with food., Disp: 30 tablet, Rfl: 0   montelukast (SINGULAIR) 10 MG tablet, Take 1 tablet (10 mg total) by mouth at bedtime., Disp: 14 tablet, Rfl: 0   ondansetron (ZOFRAN-ODT) 4 MG disintegrating tablet, Take 1 tablet (4 mg total) by mouth every 8 (eight) hours as needed for nausea or vomiting. Place under tongue to dissolve, Disp: 20 tablet, Rfl: 0   acetaminophen (TYLENOL) 500 MG tablet, Take 2 tablets (1,000 mg total) by mouth every 8 (eight) hours as needed (pain)., Disp: 60 tablet, Rfl: 0   albuterol (VENTOLIN HFA) 108 (90 Base) MCG/ACT inhaler, Inhale 1-2 puffs into the lungs every 6 (six) hours as needed., Disp: 8 g, Rfl: 0   ALPRAZolam (XANAX) 1 MG tablet, Take 1 mg by mouth daily as needed., Disp: , Rfl:    ARIPiprazole (ABILIFY IM), Inject 300 mg into the muscle every 30 (thirty) days., Disp: , Rfl:    Blood Pressure Monitoring (BLOOD PRESSURE KIT) DEVI, 1 Device by Does not apply route once a week., Disp: 1 each,  Rfl: 0   brompheniramine-pseudoephedrine-DM 30-2-10 MG/5ML syrup, Take 5 mLs by mouth 4 (four) times daily as needed., Disp: 120 mL, Rfl: 0   chlorproMAZINE (THORAZINE) 25 MG tablet, Take 25 mg by mouth at bedtime., Disp: , Rfl:    docusate sodium (COLACE) 100 MG capsule, Take 1 capsule (100 mg total) by mouth 2 (two) times daily., Disp: 30 capsule, Rfl: 0   doxycycline (VIBRA-TABS) 100 MG tablet, Take 1 tablet (100 mg total) by mouth 2 (two) times daily., Disp: 14 tablet, Rfl: 0   fluconazole (DIFLUCAN) 150 MG tablet, Take 1 tablet (150 mg total) by mouth once a week., Disp: 4 tablet, Rfl: 0   fluticasone (FLONASE) 50 MCG/ACT nasal spray, Place 2 sprays into both nostrils daily., Disp: 16 g, Rfl: 0   fluticasone-salmeterol (ADVAIR HFA) 115-21 MCG/ACT inhaler, Inhale 2 puffs into the lungs 2 (two) times daily., Disp: 1 each, Rfl: 1   levocetirizine (XYZAL) 5 MG tablet, Take 1 tablet (5 mg total) by mouth every evening., Disp: 30 tablet, Rfl: 0   nystatin (MYCOSTATIN/NYSTOP) powder, Apply 1 Application topically 3 (three) times daily., Disp: 60 g, Rfl: 0   nystatin cream (MYCOSTATIN), Apply 1 Application topically 2 (two) times daily., Disp: 30 g, Rfl: 1   nystatin-triamcinolone ointment (MYCOLOG), Apply 1 Application topically 2 (two) times daily., Disp: 30 g, Rfl: 0   pantoprazole (PROTONIX) 40 MG tablet, Take 1 tablet (40 mg total) by mouth daily.,  Disp: 30 tablet, Rfl: 0   propranolol (INDERAL) 20 MG tablet, Take by mouth., Disp: , Rfl:    VRAYLAR 1.5 MG capsule, Take 1.5 mg by mouth daily., Disp: , Rfl:    Medications ordered in this encounter:  Meds ordered this encounter  Medications   ondansetron (ZOFRAN-ODT) 4 MG disintegrating tablet    Sig: Take 1 tablet (4 mg total) by mouth every 8 (eight) hours as needed for nausea or vomiting. Place under tongue to dissolve    Dispense:  20 tablet    Refill:  0   ibuprofen (ADVIL) 800 MG tablet    Sig: Take 1 tablet (800 mg total) by mouth every  8 (eight) hours as needed for cramping. Take with food.    Dispense:  30 tablet    Refill:  0   montelukast (SINGULAIR) 10 MG tablet    Sig: Take 1 tablet (10 mg total) by mouth at bedtime.    Dispense:  14 tablet    Refill:  0   guaiFENesin (TUSSIN MUCUS & CHEST CONGEST) 100 MG/5ML liquid    Sig: Take 5 mLs by mouth every 6 (six) hours as needed for cough or to loosen phlegm.    Dispense:  120 mL    Refill:  0     *If you need refills on other medications prior to your next appointment, please contact your pharmacy*  Follow-Up: Call back or seek an in-person evaluation if the symptoms worsen or if the condition fails to improve as anticipated.  [facility] [phone]  Other Instructions Start taking montelukast nightly. Place one tablet of zofran under your tongue and let dissolve every 8 hours as needed for nausea. Use the ibuprofen, one tablet every 8 hours with food, as needed for menstrual cramping. Use the cough medication (Tussin) every 6 hours as needed to help with cough and loosen phlegm. Rest and drink plenty of water. Monitor for any new symptoms which may warrant an in person evaluation - fever, shortness of breath, chest discomfort.   If you have been instructed to have an in-person evaluation today at a local Urgent Care facility, please use the link below. It will take you to a list of all of our available [place] [facility], including address, phone number and hours of operation. Please do not delay care.  [facility]  If you or a family member do not have a primary care provider, use the link below to schedule a visit and establish care. When you choose a [facility] primary care physician or advanced practice provider, you gain a long-term partner in health. Find a Primary Care Provider  Learn more about [place]'s in-office and virtual care options: [place] - Get Care Now

## 2023-09-16 NOTE — Progress Notes (Signed)
Virtual Visit Consent   [name], you are scheduled for a virtual visit with a [facility] provider today. Just as with appointments in the office, your consent must be obtained to participate. Your consent will be active for this visit and any virtual visit you may have with one of our providers in the next 365 days. If you have a MyChart account, a copy of this consent can be sent to you electronically.  As this is a virtual visit, video technology does not allow for your provider to perform a traditional examination. This may limit your provider's ability to fully assess your condition. If your provider identifies any concerns that need to be evaluated in person or the need to arrange testing (such as labs, EKG, etc.), we will make arrangements to do so. Although advances in technology are sophisticated, we cannot ensure that it will always work on either your end or our end. If the connection with a video visit is poor, the visit may have to be switched to a telephone visit. With either a video or telephone visit, we are not always able to ensure that we have a secure connection.  By engaging in this virtual visit, you consent to the provision of healthcare and authorize for your insurance to be billed (if applicable) for the services provided during this visit. Depending on your insurance coverage, you may receive a charge related to this service.  I need to obtain your verbal consent now. Are you willing to proceed with your visit today? [name] has provided verbal consent on 09/16/2023 for a virtual visit (video or telephone). Nichole Bees, PA  Date: 09/16/2023 9:46 AM  Virtual Visit via Video Note   I, [name], connected with  [name]  ([id number], Sep 18, 1986) on 09/16/23 at  8:15 AM EDT by a video-enabled telemedicine application and verified that I am speaking with the correct person using two identifiers.  Location: Patient: Virtual Visit Location  Patient: Home Provider: Virtual Visit Location Provider: Home Office   I discussed the limitations of evaluation and management by telemedicine and the availability of in person appointments. The patient expressed understanding and agreed to proceed.    History of Present Illness: [name] is a 37 y.o. female who is being seen today for nausea, cough and menstrual cramping.  HPI: 37yo female presents today with complaints of nausea x 2 days and abdominal cramping associated with her menstrual period. States this occurs monthly and is not new. She denies decreased appetite, vomiting, diarrhea or constipation. She is requesting an Rx for ibuprofen. States this works well for her and she tolerates it without ADRs. Additionally, pt is concerned about a cough she has had for the past 24 hours. Wants to "nip it in the bud" before it gets worse. Denies SOB, DOE, CP, palps or wheezing. Has hx of asthma but states no issues with this and does not need inhalers. Was previously on xyzal and singulair, but ran out. Denies fever, HA, sinus pain, ear pain. Minimal throat discomfort and congestion. Denies need for refill of flonase.    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     Methylphenidate Derivatives Other (See Comments)    Reaction:  Depression and anger    Neurontin [Gabapentin] Other (See Comments)    Reaction:  Dizziness    Prozac [Fluoxetine Hcl] Other (See Comments)    Reaction:  Anger    Methylphenidate Hcl     SAME AS PREVIOUS LISTED METHYLPHENIDATE   Medications:  Current Outpatient Medications:    guaiFENesin (TUSSIN MUCUS & CHEST CONGEST) 100 MG/5ML liquid, Take 5 mLs by mouth every 6 (six) hours as needed for cough or to loosen phlegm., Disp: 120 mL, Rfl: 0   ibuprofen (ADVIL) 800 MG tablet, Take 1 tablet (800 mg total) by mouth every 8 (eight) hours as needed for cramping. Take with food., Disp: 30 tablet, Rfl: 0   montelukast (SINGULAIR) 10 MG tablet, Take 1 tablet (10 mg total) by mouth at bedtime., Disp: 14 tablet, Rfl: 0   ondansetron (ZOFRAN-ODT) 4 MG disintegrating tablet, Take 1 tablet (4 mg total) by mouth every 8 (eight) hours as needed for nausea or vomiting. Place under tongue to dissolve, Disp: 20 tablet, Rfl: 0   acetaminophen (TYLENOL) 500 MG tablet, Take 2 tablets (1,000 mg total) by mouth every 8 (eight) hours as needed (pain)., Disp: 60 tablet, Rfl: 0   albuterol (VENTOLIN HFA) 108 (90 Base) MCG/ACT inhaler, Inhale 1-2 puffs into the lungs every 6 (six) hours as needed., Disp: 8 g, Rfl: 0   ALPRAZolam (XANAX) 1 MG tablet, Take 1 mg by mouth daily as needed., Disp: , Rfl:    ARIPiprazole (ABILIFY IM), Inject 300 mg into the muscle every 30 (thirty) days., Disp: , Rfl:    Blood Pressure Monitoring (BLOOD PRESSURE KIT) DEVI, 1 Device by Does not apply route once a week., Disp: 1 each, Rfl: 0   brompheniramine-pseudoephedrine-DM 30-2-10 MG/5ML syrup, Take 5 mLs by mouth 4 (four) times daily as needed., Disp: 120 mL, Rfl: 0   chlorproMAZINE (THORAZINE) 25 MG  tablet, Take 25 mg by mouth at bedtime., Disp: , Rfl:    docusate sodium (COLACE) 100 MG capsule, Take 1 capsule (100 mg total) by mouth 2 (two) times daily., Disp: 30 capsule, Rfl: 0   doxycycline (VIBRA-TABS) 100 MG tablet, Take 1 tablet (100 mg total) by mouth 2 (two) times daily., Disp: 14 tablet, Rfl: 0   fluconazole (DIFLUCAN) 150 MG tablet, Take 1 tablet (150 mg total) by mouth once a week., Disp: 4 tablet, Rfl: 0   fluticasone (FLONASE) 50 MCG/ACT nasal spray, Place 2 sprays into both nostrils daily., Disp: 16 g, Rfl: 0   fluticasone-salmeterol (ADVAIR HFA) 115-21 MCG/ACT inhaler, Inhale 2 puffs into the lungs 2 (two) times daily., Disp: 1 each, Rfl: 1   levocetirizine (XYZAL) 5 MG tablet, Take 1 tablet (5 mg total) by mouth every evening., Disp: 30 tablet, Rfl: 0   nystatin (MYCOSTATIN/NYSTOP) powder, Apply 1 Application topically 3 (three) times daily., Disp: 60 g, Rfl: 0   nystatin cream (MYCOSTATIN), Apply 1  Application topically 2 (two) times daily., Disp: 30 g, Rfl: 1   nystatin-triamcinolone ointment (MYCOLOG), Apply 1 Application topically 2 (two) times daily., Disp: 30 g, Rfl: 0   pantoprazole (PROTONIX) 40 MG tablet, Take 1 tablet (40 mg total) by mouth daily., Disp: 30 tablet, Rfl: 0   propranolol (INDERAL) 20 MG tablet, Take by mouth., Disp: , Rfl:    VRAYLAR 1.5 MG capsule, Take 1.5 mg by mouth daily., Disp: , Rfl:   Observations/Objective: Patient is well-developed, well-nourished in no acute distress.  Resting comfortably at home. Non-toxic in appearance Head is normocephalic, atraumatic.  No labored breathing. No cough noted during encounter. Speech is clear and coherent with logical content.  Patient is alert and oriented at baseline.  No rash. No diaphoresis. No visible rhinorrhea, scleral icterus or injection  Assessment and Plan: 1. Menstrual cramps  2. Nausea  3. Viral URI with cough  Pt with menstrual cramping and nausea, which is a chronic recurring  condition. Pt specifically requesting ibuprofen and zofran, both of which have worked for her in the past. Cough is new onset, no red flag s/sx. Has hx of allergies and asthma, not currently on medications for this. Will start to mucinex and montelukast. Pt deferred need for flonase Rx.  Follow Up Instructions: I discussed the assessment and treatment plan with the patient. The patient was provided an opportunity to ask questions and all were answered. The patient agreed with the plan and demonstrated an understanding of the instructions.  A copy of instructions were sent to the patient via MyChart unless otherwise noted below.    The patient was advised to call back or seek an in-person evaluation if the symptoms worsen or if the condition fails to improve as anticipated.  Time:  I spent 10 minutes with the patient via telehealth technology discussing the above problems/concerns.    Kassondra Geil L Jahnessa Vanduyn, PA

## 2023-10-05 DIAGNOSIS — F418 Other specified anxiety disorders: Secondary | ICD-10-CM | POA: Diagnosis not present

## 2023-10-05 DIAGNOSIS — F3162 Bipolar disorder, current episode mixed, moderate: Secondary | ICD-10-CM | POA: Diagnosis not present

## 2023-10-30 ENCOUNTER — Telehealth: Payer: Medicare HMO | Admitting: Physician Assistant

## 2023-10-30 DIAGNOSIS — B9689 Other specified bacterial agents as the cause of diseases classified elsewhere: Secondary | ICD-10-CM | POA: Diagnosis not present

## 2023-10-30 DIAGNOSIS — J069 Acute upper respiratory infection, unspecified: Secondary | ICD-10-CM

## 2023-10-30 DIAGNOSIS — K219 Gastro-esophageal reflux disease without esophagitis: Secondary | ICD-10-CM

## 2023-10-30 MED ORDER — AMOXICILLIN-POT CLAVULANATE 875-125 MG PO TABS
1.0000 | ORAL_TABLET | Freq: Two times a day (BID) | ORAL | 0 refills | Status: DC
Start: 2023-10-30 — End: 2023-11-16

## 2023-10-30 MED ORDER — IBUPROFEN 800 MG PO TABS
800.0000 mg | ORAL_TABLET | Freq: Three times a day (TID) | ORAL | 0 refills | Status: DC | PRN
Start: 2023-10-30 — End: 2024-04-28

## 2023-10-30 MED ORDER — PANTOPRAZOLE SODIUM 40 MG PO TBEC: 40.0000 mg | DELAYED_RELEASE_TABLET | Freq: Every day | ORAL | 0 refills | Status: AC

## 2023-10-30 MED ORDER — ONDANSETRON 4 MG PO TBDP
4.0000 mg | ORAL_TABLET | Freq: Three times a day (TID) | ORAL | 0 refills | Status: DC | PRN
Start: 2023-10-30 — End: 2024-01-17

## 2023-10-30 NOTE — Patient Instructions (Signed)
[name], thank you for joining Nichole Loveless, PA-C for today's virtual visit.  While this provider is not your primary care provider (PCP), if your PCP is located in our provider database this encounter information will be shared with them immediately following your visit.   A [facility] MyChart account gives you access to today's visit and all your visits, tests, and labs performed at [facility] " click here if you don't have a [facility] MyChart account or go to [email or web address]  Consent: (Patient) [name] provided verbal consent for this virtual visit at the beginning of the encounter.  Current Medications:  Current Outpatient Medications:    amoxicillin-clavulanate (AUGMENTIN) 875-125 MG tablet, Take 1 tablet by mouth 2 (two) times daily., Disp: 14 tablet, Rfl: 0   ibuprofen (ADVIL) 800 MG tablet, Take 1 tablet (800 mg total) by mouth every 8 (eight) hours as needed., Disp: 30 tablet, Rfl: 0   ondansetron (ZOFRAN-ODT) 4 MG disintegrating tablet, Take 1 tablet (4 mg total) by mouth every 8 (eight) hours as needed., Disp: 20 tablet, Rfl: 0   acetaminophen (TYLENOL) 500 MG tablet, Take 2 tablets (1,000 mg total) by mouth every 8 (eight) hours as needed (pain)., Disp: 60 tablet, Rfl: 0   albuterol (VENTOLIN HFA) 108 (90 Base) MCG/ACT inhaler, Inhale 1-2 puffs into the lungs every 6 (six) hours as needed., Disp: 8 g, Rfl: 0   ALPRAZolam (XANAX) 1 MG tablet, Take 1 mg by mouth daily as needed., Disp: , Rfl:    ARIPiprazole (ABILIFY IM), Inject 300 mg into the muscle every 30 (thirty) days., Disp: , Rfl:    Blood Pressure Monitoring (BLOOD PRESSURE KIT) DEVI, 1 Device by Does not apply route once a week., Disp: 1 each, Rfl: 0   brompheniramine-pseudoephedrine-DM 30-2-10 MG/5ML syrup, Take 5 mLs by mouth 4 (four) times daily as needed., Disp: 120 mL, Rfl: 0   chlorproMAZINE (THORAZINE) 25 MG tablet, Take 25 mg by mouth at bedtime., Disp: , Rfl:     docusate sodium (COLACE) 100 MG capsule, Take 1 capsule (100 mg total) by mouth 2 (two) times daily., Disp: 30 capsule, Rfl: 0   doxycycline (VIBRA-TABS) 100 MG tablet, Take 1 tablet (100 mg total) by mouth 2 (two) times daily., Disp: 14 tablet, Rfl: 0   fluconazole (DIFLUCAN) 150 MG tablet, Take 1 tablet (150 mg total) by mouth once a week., Disp: 4 tablet, Rfl: 0   fluticasone (FLONASE) 50 MCG/ACT nasal spray, Place 2 sprays into both nostrils daily., Disp: 16 g, Rfl: 0   fluticasone-salmeterol (ADVAIR HFA) 115-21 MCG/ACT inhaler, Inhale 2 puffs into the lungs 2 (two) times daily., Disp: 1 each, Rfl: 1   guaiFENesin (TUSSIN MUCUS & CHEST CONGEST) 100 MG/5ML liquid, Take 5 mLs by mouth every 6 (six) hours as needed for cough or to loosen phlegm., Disp: 120 mL, Rfl: 0   levocetirizine (XYZAL) 5 MG tablet, Take 1 tablet (5 mg total) by mouth every evening., Disp: 30 tablet, Rfl: 0   montelukast (SINGULAIR) 10 MG tablet, Take 1 tablet (10 mg total) by mouth at bedtime., Disp: 14 tablet, Rfl: 0   nystatin (MYCOSTATIN/NYSTOP) powder, Apply 1 Application topically 3 (three) times daily., Disp: 60 g, Rfl: 0   nystatin cream (MYCOSTATIN), Apply 1 Application topically 2 (two) times daily., Disp: 30 g, Rfl: 1   nystatin-triamcinolone ointment (MYCOLOG), Apply 1 Application topically 2 (two) times daily., Disp: 30 g, Rfl: 0   pantoprazole (PROTONIX) 40 MG tablet, Take 1  tablet (40 mg total) by mouth daily., Disp: 90 tablet, Rfl: 0   propranolol (INDERAL) 20 MG tablet, Take by mouth., Disp: , Rfl:    VRAYLAR 1.5 MG capsule, Take 1.5 mg by mouth daily., Disp: , Rfl:    Medications ordered in this encounter:  Meds ordered this encounter  Medications   ondansetron (ZOFRAN-ODT) 4 MG disintegrating tablet    Sig: Take 1 tablet (4 mg total) by mouth every 8 (eight) hours as needed.    Dispense:  20 tablet    Refill:  0    Order Specific Question:   Supervising Provider    Answer:   Merrilee Jansky [id number]    amoxicillin-clavulanate (AUGMENTIN) 875-125 MG tablet    Sig: Take 1 tablet by mouth 2 (two) times daily.    Dispense:  14 tablet    Refill:  0    Order Specific Question:   Supervising Provider    Answer:   Merrilee Jansky [id number]   ibuprofen (ADVIL) 800 MG tablet    Sig: Take 1 tablet (800 mg total) by mouth every 8 (eight) hours as needed.    Dispense:  30 tablet    Refill:  0    Order Specific Question:   Supervising Provider    Answer:   Merrilee Jansky [[id number]]   pantoprazole (PROTONIX) 40 MG tablet    Sig: Take 1 tablet (40 mg total) by mouth daily.    Dispense:  90 tablet    Refill:  0    Order Specific Question:   Supervising Provider    Answer:   Merrilee Jansky [id number]     *If you need refills on other medications prior to your next appointment, please contact your pharmacy*  Follow-Up: Call back or seek an in-person evaluation if the symptoms worsen or if the condition fails to improve as anticipated.  [facility] [phone]  Other Instructions Upper Respiratory Infection, Adult An upper respiratory infection (URI) is a common viral infection of the nose, throat, and upper air passages that lead to the lungs. The most common type of URI is the common cold. URIs usually get better on their own, without medical treatment. What are the causes? A URI is caused by a virus. You may catch a virus by: Breathing in droplets from an infected person's cough or sneeze. Touching something that has been exposed to the virus (is contaminated) and then touching your mouth, nose, or eyes. What increases the risk? You are more likely to get a URI if: You are very young or very old. You have close contact with others, such as at work, school, or a health care facility. You smoke. You have long-term (chronic) heart or lung disease. You have a weakened disease-fighting system (immune system). You have nasal allergies or asthma. You are experiencing a  lot of stress. You have poor nutrition. What are the signs or symptoms? A URI usually involves some of the following symptoms: Runny or stuffy (congested) nose. Cough. Sneezing. Sore throat. Headache. Fatigue. Fever. Loss of appetite. Pain in your forehead, behind your eyes, and over your cheekbones (sinus pain). Muscle aches. Redness or irritation of the eyes. Pressure in the ears or face. How is this diagnosed? This condition may be diagnosed based on your medical history and symptoms, and a physical exam. Your health care provider may use a swab to take a mucus sample from your nose (nasal swab). This sample can be tested to determine what  virus is causing the illness. How is this treated? URIs usually get better on their own within 7-10 days. Medicines cannot cure URIs, but your health care provider may recommend certain medicines to help relieve symptoms, such as: Over-the-counter cold medicines. Cough suppressants. Coughing is a type of defense against infection that helps to clear the respiratory system, so take these medicines only as recommended by your health care provider. Fever-reducing medicines. Follow these instructions at home: Activity Rest as needed. If you have a fever, stay home from work or school until your fever is gone or until your health care provider says your URI cannot spread to other people (is no longer contagious). Your health care provider may have you wear a face mask to prevent your infection from spreading. Relieving symptoms Gargle with a mixture of salt and water 3-4 times a day or as needed. To make salt water, completely dissolve -1 tsp (3-6 g) of salt in 1 cup (237 mL) of warm water. Use a cool-mist humidifier to add moisture to the air. This can help you breathe more easily. Eating and drinking  Drink enough fluid to keep your urine pale yellow. Eat soups and other clear broths. General instructions  Take over-the-counter and prescription  medicines only as told by your health care provider. These include cold medicines, fever reducers, and cough suppressants. Do not use any products that contain nicotine or tobacco. These products include cigarettes, chewing tobacco, and vaping devices, such as e-cigarettes. If you need help quitting, ask your health care provider. Stay away from secondhand smoke. Stay up to date on all immunizations, including the yearly (annual) flu vaccine. Keep all follow-up visits. This is important. How to prevent the spread of infection to others URIs can be contagious. To prevent the infection from spreading: Wash your hands with soap and water for at least 20 seconds. If soap and water are not available, use hand sanitizer. Avoid touching your mouth, face, eyes, or nose. Cough or sneeze into a tissue or your sleeve or elbow instead of into your hand or into the air.  Contact a health care provider if: You are getting worse instead of better. You have a fever or chills. Your mucus is brown or red. You have yellow or brown discharge coming from your nose. You have pain in your face, especially when you bend forward. You have swollen neck glands. You have pain while swallowing. You have white areas in the back of your throat. Get help right away if: You have shortness of breath that gets worse. You have severe or persistent: Headache. Ear pain. Sinus pain. Chest pain. You have chronic lung disease along with any of the following: Making high-pitched whistling sounds when you breathe, most often when you breathe out (wheezing). Prolonged cough (more than 14 days). Coughing up blood. A change in your usual mucus. You have a stiff neck. You have changes in your: Vision. Hearing. Thinking. Mood. These symptoms may be an emergency. Get help right away. Call 911. Do not wait to see if the symptoms will go away. Do not drive yourself to the hospital. Summary An upper respiratory infection  (URI) is a common infection of the nose, throat, and upper air passages that lead to the lungs. A URI is caused by a virus. URIs usually get better on their own within 7-10 days. Medicines cannot cure URIs, but your health care provider may recommend certain medicines to help relieve symptoms. This information is not intended to replace advice given  to you by your health care provider. Make sure you discuss any questions you have with your health care provider. Document Revised: 07/03/2021 Document Reviewed: 07/03/2021 Elsevier Patient Education  2024 [employer]    If you have been instructed to have an in-person evaluation today at a local Urgent Care facility, please use the link below. It will take you to a list of all of our available [facility], including address, phone number and hours of operation. Please do not delay care.  [facility]  If you or a family member do not have a primary care provider, use the link below to schedule a visit and establish care. When you choose a [facility] primary care physician or advanced practice provider, you gain a long-term partner in health. Find a Primary Care Provider  Learn more about [facility]'s in-office and virtual care options: [facility] - Get Care Now

## 2023-10-30 NOTE — Progress Notes (Signed)
Virtual Visit Consent   [name], you are scheduled for a virtual visit with a [facility] provider today. Just as with appointments in the office, your consent must be obtained to participate. Your consent will be active for this visit and any virtual visit you may have with one of our providers in the next 365 days. If you have a MyChart account, a copy of this consent can be sent to you electronically.  As this is a virtual visit, video technology does not allow for your provider to perform a traditional examination. This may limit your provider's ability to fully assess your condition. If your provider identifies any concerns that need to be evaluated in person or the need to arrange testing (such as labs, EKG, etc.), we will make arrangements to do so. Although advances in technology are sophisticated, we cannot ensure that it will always work on either your end or our end. If the connection with a video visit is poor, the visit may have to be switched to a telephone visit. With either a video or telephone visit, we are not always able to ensure that we have a secure connection.  By engaging in this virtual visit, you consent to the provision of healthcare and authorize for your insurance to be billed (if applicable) for the services provided during this visit. Depending on your insurance coverage, you may receive a charge related to this service.  I need to obtain your verbal consent now. Are you willing to proceed with your visit today? [name] has provided verbal consent on 10/30/2023 for a virtual visit (video or telephone). Nichole Loveless, PA-C  Date: 10/30/2023 8:36 AM  Virtual Visit via Video Note   I, [name], connected with  [name]  ([id number], [birth year]) on 10/30/23 at  8:15 AM EST by a video-enabled telemedicine application and verified that I am speaking with the correct person using two identifiers.  Location: Patient: Virtual  Visit Location Patient: Home Provider: Virtual Visit Location Provider: Home Office   I discussed the limitations of evaluation and management by telemedicine and the availability of in person appointments. The patient expressed understanding and agreed to proceed.    History of Present Illness: [name] is a 37 y.o. who identifies as a female who was assigned female at birth, and is being seen today for flu-like symptoms.  HPI: Influenza This is a new problem. The current episode started 1 to 4 weeks ago. The problem occurs intermittently. The problem has been gradually worsening. Associated symptoms include abdominal pain, arthralgias, congestion, coughing, fatigue, headaches, myalgias, nausea, a sore throat and vomiting. Pertinent negatives include no chest pain, chills, diaphoresis or fever. Associated symptoms comments: Diarrhea. Treatments tried: dayquil. The treatment provided no relief.     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     Methylphenidate Derivatives Other (See Comments)     Reaction:  Depression and anger    Neurontin [Gabapentin] Other (See Comments)    Reaction:  Dizziness    Prozac [Fluoxetine Hcl] Other (See Comments)    Reaction:  Anger    Methylphenidate Hcl     SAME AS PREVIOUS LISTED METHYLPHENIDATE   Medications:  Current Outpatient Medications:    amoxicillin-clavulanate (AUGMENTIN) 875-125 MG tablet, Take 1 tablet by mouth 2 (two) times daily., Disp: 14 tablet, Rfl: 0   ibuprofen (ADVIL) 800 MG tablet, Take 1 tablet (800 mg total) by mouth every 8 (eight) hours as needed., Disp: 30 tablet, Rfl: 0   ondansetron (ZOFRAN-ODT) 4 MG disintegrating tablet, Take 1 tablet (4 mg total) by mouth every 8 (eight) hours as needed., Disp: 20 tablet, Rfl: 0   acetaminophen (TYLENOL) 500 MG tablet, Take 2 tablets (1,000 mg total) by mouth every 8 (eight) hours as needed (pain)., Disp: 60 tablet, Rfl: 0   albuterol (VENTOLIN HFA) 108 (90 Base) MCG/ACT inhaler, Inhale 1-2 puffs into the lungs every 6 (six) hours as needed., Disp: 8 g, Rfl: 0   ALPRAZolam (XANAX) 1 MG tablet, Take 1 mg by mouth daily as needed., Disp: , Rfl:    ARIPiprazole (ABILIFY IM), Inject 300 mg into the muscle every 30 (thirty) days., Disp: , Rfl:    Blood Pressure Monitoring (BLOOD PRESSURE KIT) DEVI, 1 Device by Does not apply route once a week., Disp: 1 each, Rfl: 0   brompheniramine-pseudoephedrine-DM 30-2-10 MG/5ML syrup, Take 5 mLs by mouth 4 (four) times daily as needed., Disp: 120 mL, Rfl: 0   chlorproMAZINE (THORAZINE) 25 MG tablet, Take 25 mg by mouth at bedtime., Disp: , Rfl:    docusate sodium (COLACE) 100 MG capsule, Take 1 capsule (100 mg total) by mouth 2 (two) times daily., Disp: 30 capsule, Rfl: 0   doxycycline (VIBRA-TABS) 100 MG tablet, Take 1 tablet (100 mg total) by mouth 2 (two) times daily., Disp: 14 tablet, Rfl: 0   fluconazole (DIFLUCAN) 150 MG tablet, Take 1 tablet (150 mg total) by mouth once a week., Disp: 4 tablet, Rfl: 0   fluticasone (FLONASE) 50 MCG/ACT nasal  spray, Place 2 sprays into both nostrils daily., Disp: 16 g, Rfl: 0   fluticasone-salmeterol (ADVAIR HFA) 115-21 MCG/ACT inhaler, Inhale 2 puffs into the lungs 2 (two) times daily., Disp: 1 each, Rfl: 1   guaiFENesin (TUSSIN MUCUS & CHEST CONGEST) 100 MG/5ML liquid, Take 5 mLs by mouth every 6 (six) hours as needed for cough or to loosen phlegm., Disp: 120 mL, Rfl: 0   levocetirizine (XYZAL) 5 MG tablet, Take 1 tablet (5 mg total) by mouth every evening., Disp: 30 tablet, Rfl: 0   montelukast (SINGULAIR) 10 MG tablet, Take 1 tablet (10 mg total) by mouth at bedtime., Disp: 14 tablet, Rfl: 0   nystatin (MYCOSTATIN/NYSTOP) powder, Apply 1 Application topically 3 (three) times daily., Disp: 60 g, Rfl: 0   nystatin cream (MYCOSTATIN), Apply 1 Application topically 2 (two) times daily., Disp: 30 g, Rfl: 1   nystatin-triamcinolone ointment (MYCOLOG), Apply 1 Application topically 2 (two) times daily., Disp: 30 g, Rfl: 0   pantoprazole (PROTONIX) 40 MG tablet, Take 1 tablet (40 mg total) by mouth daily., Disp: 90 tablet, Rfl: 0  propranolol (INDERAL) 20 MG tablet, Take by mouth., Disp: , Rfl:    VRAYLAR 1.5 MG capsule, Take 1.5 mg by mouth daily., Disp: , Rfl:    Observations/Objective: Patient is well-developed, well-nourished in no acute distress.  Resting comfortably at home.  Head is normocephalic, atraumatic.  No labored breathing.  Speech is clear and coherent with logical content.  Patient is alert and oriented at baseline.    Assessment and Plan: 1. Bacterial upper respiratory infection - ondansetron (ZOFRAN-ODT) 4 MG disintegrating tablet; Take 1 tablet (4 mg total) by mouth every 8 (eight) hours as needed.  Dispense: 20 tablet; Refill: 0 - amoxicillin-clavulanate (AUGMENTIN) 875-125 MG tablet; Take 1 tablet by mouth 2 (two) times daily.  Dispense: 14 tablet; Refill: 0 - ibuprofen (ADVIL) 800 MG tablet; Take 1 tablet (800 mg total) by mouth every 8 (eight) hours as needed.  Dispense: 30  tablet; Refill: 0  2. Gastroesophageal reflux disease without esophagitis - pantoprazole (PROTONIX) 40 MG tablet; Take 1 tablet (40 mg total) by mouth daily.  Dispense: 90 tablet; Refill: 0  - Worsening symptoms that have not responded to OTC medications.  - Will give Augmentin - Ibuprofen and Zofran added for body aches and nausea - Continue allergy medications.  - Steam and humidifier can help - Stay well hydrated and get plenty of rest.  - Pantoprazole refilled at patient request for a one time 90 day refill - Seek in person evaluation if no symptom improvement or if symptoms worsen   Follow Up Instructions: I discussed the assessment and treatment plan with the patient. The patient was provided an opportunity to ask questions and all were answered. The patient agreed with the plan and demonstrated an understanding of the instructions.  A copy of instructions were sent to the patient via MyChart unless otherwise noted below.    The patient was advised to call back or seek an in-person evaluation if the symptoms worsen or if the condition fails to improve as anticipated.    Nichole Loveless, PA-C

## 2023-11-01 ENCOUNTER — Telehealth: Payer: Medicare HMO | Admitting: Family Medicine

## 2023-11-01 DIAGNOSIS — H1033 Unspecified acute conjunctivitis, bilateral: Secondary | ICD-10-CM | POA: Diagnosis not present

## 2023-11-01 MED ORDER — ERYTHROMYCIN 5 MG/GM OP OINT: 1.0000 | TOPICAL_OINTMENT | Freq: Four times a day (QID) | OPHTHALMIC | 0 refills | Status: AC

## 2023-11-01 NOTE — Progress Notes (Signed)
Virtual Visit Consent   [name], you are scheduled for a virtual visit with a [facility] provider today. Just as with appointments in the office, your consent must be obtained to participate. Your consent will be active for this visit and any virtual visit you may have with one of our providers in the next 365 days. If you have a MyChart account, a copy of this consent can be sent to you electronically.  As this is a virtual visit, video technology does not allow for your provider to perform a traditional examination. This may limit your provider's ability to fully assess your condition. If your provider identifies any concerns that need to be evaluated in person or the need to arrange testing (such as labs, EKG, etc.), we will make arrangements to do so. Although advances in technology are sophisticated, we cannot ensure that it will always work on either your end or our end. If the connection with a video visit is poor, the visit may have to be switched to a telephone visit. With either a video or telephone visit, we are not always able to ensure that we have a secure connection.  By engaging in this virtual visit, you consent to the provision of healthcare and authorize for your insurance to be billed (if applicable) for the services provided during this visit. Depending on your insurance coverage, you may receive a charge related to this service.  I need to obtain your verbal consent now. Are you willing to proceed with your visit today? [name] has provided verbal consent on 11/01/2023 for a virtual visit (video or telephone). [name]  Date: 11/01/2023 2:20 PM  Virtual Visit via Video Note   I, [name], connected with  [name]  ([id number], [birth year]) on 11/01/23 at  2:15 PM EST by a video-enabled telemedicine application and verified that I am speaking with the correct person using two identifiers.  Location: Patient: Virtual Visit Location  Patient: Home Provider: Virtual Visit Location Provider: Home Office   I discussed the limitations of evaluation and management by telemedicine and the availability of in person appointments. The patient expressed understanding and agreed to proceed.    History of Present Illness: [name] is a 37 y.o. who identifies as a female who was assigned female at birth, and is being seen today for redness, irritation, yellow drainage itching and matting to both eyes for 2 days worsening. 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     Methylphenidate Derivatives Other (See Comments)    Reaction:  Depression and anger    Neurontin [Gabapentin]  Other (See Comments)    Reaction:  Dizziness    Prozac [Fluoxetine Hcl] Other (See Comments)    Reaction:  Anger    Methylphenidate Hcl     SAME AS PREVIOUS LISTED METHYLPHENIDATE   Medications:  Current Outpatient Medications:    erythromycin ophthalmic ointment, Place 1 Application into both eyes 4 (four) times daily for 7 days., Disp: 28 g,  Rfl: 0   acetaminophen (TYLENOL) 500 MG tablet, Take 2 tablets (1,000 mg total) by mouth every 8 (eight) hours as needed (pain)., Disp: 60 tablet, Rfl: 0   albuterol (VENTOLIN HFA) 108 (90 Base) MCG/ACT inhaler, Inhale 1-2 puffs into the lungs every 6 (six) hours as needed., Disp: 8 g, Rfl: 0   ALPRAZolam (XANAX) 1 MG tablet, Take 1 mg by mouth daily as needed., Disp: , Rfl:    amoxicillin-clavulanate (AUGMENTIN) 875-125 MG tablet, Take 1 tablet by mouth 2 (two) times daily., Disp: 14 tablet, Rfl: 0   ARIPiprazole (ABILIFY IM), Inject 300 mg into the muscle every 30 (thirty) days., Disp: , Rfl:    Blood Pressure Monitoring (BLOOD PRESSURE KIT) DEVI, 1 Device by Does not apply route once a week., Disp: 1 each, Rfl: 0   brompheniramine-pseudoephedrine-DM 30-2-10 MG/5ML syrup, Take 5 mLs by mouth 4 (four) times daily as needed., Disp: 120 mL, Rfl: 0   chlorproMAZINE (THORAZINE) 25 MG tablet, Take 25 mg by mouth at bedtime., Disp: , Rfl:    docusate sodium (COLACE) 100 MG capsule, Take 1 capsule (100 mg total) by mouth 2 (two) times daily., Disp: 30 capsule, Rfl: 0   doxycycline (VIBRA-TABS) 100 MG tablet, Take 1 tablet (100 mg total) by mouth 2 (two) times daily., Disp: 14 tablet, Rfl: 0   fluconazole (DIFLUCAN) 150 MG tablet, Take 1 tablet (150 mg total) by mouth once a week., Disp: 4 tablet, Rfl: 0   fluticasone (FLONASE) 50 MCG/ACT nasal spray, Place 2 sprays into both nostrils daily., Disp: 16 g, Rfl: 0   fluticasone-salmeterol (ADVAIR HFA) 115-21 MCG/ACT inhaler, Inhale 2 puffs into the lungs 2 (two) times daily., Disp: 1 each, Rfl: 1   guaiFENesin (TUSSIN MUCUS & CHEST CONGEST) 100 MG/5ML liquid, Take 5 mLs by mouth every 6 (six) hours as needed for cough or to loosen phlegm., Disp: 120 mL, Rfl: 0   ibuprofen (ADVIL) 800 MG tablet, Take 1 tablet (800 mg total) by mouth every 8 (eight) hours as needed., Disp: 30 tablet, Rfl: 0   levocetirizine (XYZAL) 5 MG tablet, Take 1 tablet (5 mg total) by  mouth every evening., Disp: 30 tablet, Rfl: 0   montelukast (SINGULAIR) 10 MG tablet, Take 1 tablet (10 mg total) by mouth at bedtime., Disp: 14 tablet, Rfl: 0   nystatin (MYCOSTATIN/NYSTOP) powder, Apply 1 Application topically 3 (three) times daily., Disp: 60 g, Rfl: 0   nystatin cream (MYCOSTATIN), Apply 1 Application topically 2 (two) times daily., Disp: 30 g, Rfl: 1   nystatin-triamcinolone ointment (MYCOLOG), Apply 1 Application topically 2 (two) times daily., Disp: 30 g, Rfl: 0   ondansetron (ZOFRAN-ODT) 4 MG disintegrating tablet, Take 1 tablet (4 mg total) by mouth every 8 (eight) hours as needed., Disp: 20 tablet, Rfl: 0   pantoprazole (PROTONIX) 40 MG tablet, Take 1 tablet (40 mg total) by mouth daily., Disp: 90 tablet, Rfl: 0   propranolol (INDERAL) 20 MG tablet, Take by mouth., Disp: , Rfl:    VRAYLAR 1.5 MG capsule, Take 1.5 mg by mouth daily., Disp: , Rfl:   Observations/Objective:  Patient is well-developed, well-nourished in no acute distress.  Resting comfortably  at home.  Head is normocephalic, atraumatic.  No labored breathing.  Speech is clear and coherent with logical content.  Patient is alert and oriented at baseline.    Assessment and Plan: 1. Acute conjunctivitis of both eyes, unspecified acute conjunctivitis type  Requests emycin ointment.   Follow Up Instructions: I discussed the assessment and treatment plan with the patient. The patient was provided an opportunity to ask questions and all were answered. The patient agreed with the plan and demonstrated an understanding of the instructions.  A copy of instructions were sent to the patient via MyChart unless otherwise noted below.     The patient was advised to call back or seek an in-person evaluation if the symptoms worsen or if the condition fails to improve as anticipated.    [name]

## 2023-11-14 ENCOUNTER — Telehealth: Payer: Medicare HMO

## 2023-11-14 DIAGNOSIS — J069 Acute upper respiratory infection, unspecified: Secondary | ICD-10-CM

## 2023-11-14 MED ORDER — PREDNISONE 20 MG PO TABS: 20.0000 mg | ORAL_TABLET | Freq: Two times a day (BID) | ORAL | 0 refills | Status: AC

## 2023-11-14 MED ORDER — ALBUTEROL SULFATE HFA 108 (90 BASE) MCG/ACT IN AERS: 1.0000 | INHALATION_SPRAY | Freq: Four times a day (QID) | RESPIRATORY_TRACT | 0 refills | Status: DC | PRN

## 2023-11-14 MED ORDER — PROMETHAZINE HCL 12.5 MG PO TABS
12.5000 mg | ORAL_TABLET | Freq: Three times a day (TID) | ORAL | 0 refills | Status: DC | PRN
Start: 2023-11-14 — End: 2024-01-11

## 2023-11-14 NOTE — Patient Instructions (Signed)
Asthma, Adult  Asthma is a long-term (chronic) condition that causes recurrent episodes in which the lower airways in the lungs become tight and narrow. The narrowing is caused by inflammation and tightening of the smooth muscle around the lower airways. Asthma episodes, also called asthma attacks or asthma flares, may cause coughing, making high-pitched whistling sounds when you breathe, most often when you breathe out (wheezing), shortness of breath, and chest pain. The airways may produce extra mucus caused by the inflammation and irritation. During an attack, it can be difficult to breathe. Asthma attacks can range from minor to life-threatening. Asthma cannot be cured, but medicines and lifestyle changes can help control it and treat acute attacks. It is important to keep your asthma well controlled so the condition does not interfere with your daily life. What are the causes? This condition is believed to be caused by inherited (genetic) and environmental factors, but its exact cause is not known. What can trigger an asthma attack? Many things can bring on an asthma attack or make symptoms worse. These triggers are different for every person. Common triggers include: Allergens and irritants like mold, dust, pet dander, cockroaches, pollen, air pollution, and chemical odors. Cigarette smoke. Weather changes and cold air. Stress and strong emotional responses such as crying or laughing hard. Certain medications such as aspirin or beta blockers. Infections and inflammatory conditions, such as the flu, a cold, pneumonia, or inflammation of the nasal membranes (rhinitis). Gastroesophageal reflux disease (GERD). What are the signs or symptoms? Symptoms may occur right after exposure to an asthma trigger or hours later and can vary by person. Common signs and symptoms include: Wheezing. Trouble breathing (shortness of breath). Excessive nighttime or early morning coughing. Chest  tightness. Tiredness (fatigue) with minimal activity. Difficulty talking in complete sentences. Poor exercise tolerance. How is this diagnosed? This condition is diagnosed based on: A physical exam and your medical history. Tests, which may include: Lung function studies to evaluate the flow of air in your lungs. Allergy tests. Imaging tests, such as X-rays. How is this treated? There is no cure, but symptoms can be controlled with proper treatment. Treatment usually involves: Identifying and avoiding your asthma triggers. Inhaled medicines. Two types are commonly used to treat asthma, depending on severity: Controller medicines. These help prevent asthma symptoms from occurring. They are taken every day. Fast-acting reliever or rescue medicines. These quickly relieve asthma symptoms. They are used as needed and provide short-term relief. Using other medicines, such as: Allergy medicines, such as antihistamines, if your asthma attacks are triggered by allergens. Immune medicines (immunomodulators). These are medicines that help control the immune system. Using supplemental oxygen. This is only needed during a severe episode. Creating an asthma action plan. An asthma action plan is a written plan for managing and treating your asthma attacks. This plan includes: A list of your asthma triggers and how to avoid them. Information about when medicines should be taken and when their dosage should be changed. Instructions about using a device called a peak flow meter. A peak flow meter measures how well the lungs are working and the severity of your asthma. It helps you monitor your condition. Follow these instructions at home: Take over-the-counter and prescription medicines only as told by your health care provider. Stay up to date on all vaccinations as recommended by your healthcare provider, including vaccines for the flu and pneumonia. Use a peak flow meter and keep track of your peak flow  readings. Understand and use your asthma   action plan to address any asthma flares. Do not smoke or allow anyone to smoke in your home. Contact a health care provider if: You have wheezing, shortness of breath, or a cough that is not responding to medicines. Your medicines are causing side effects, such as a rash, itching, swelling, or trouble breathing. You need to use a reliever medicine more than 2-3 times a week. Your peak flow reading is still at 50-79% of your personal best after following your action plan for 1 hour. You have a fever and shortness of breath. Get help right away if: You are getting worse and do not respond to treatment during an asthma attack. You are short of breath when at rest or when doing very little physical activity. You have difficulty eating, drinking, or talking. You have chest pain or tightness. You develop a fast heartbeat or palpitations. You have a bluish color to your lips or fingernails. You are light-headed or dizzy, or you faint. Your peak flow reading is less than 50% of your personal best. You feel too tired to breathe normally. These symptoms may be an emergency. Get help right away. Call 911. Do not wait to see if the symptoms will go away. Do not drive yourself to the hospital. Summary Asthma is a long-term (chronic) condition that causes recurrent episodes in which the airways become tight and narrow. Asthma episodes, also called asthma attacks or asthma flares, can cause coughing, wheezing, shortness of breath, and chest pain. Asthma cannot be cured, but medicines and lifestyle changes can help keep it well controlled and prevent asthma flares. Make sure you understand how to avoid triggers and how and when to use your medicines. Asthma attacks can range from minor to life-threatening. Get help right away if you have an asthma attack and do not respond to treatment with your usual rescue medicines. This information is not intended to replace  advice given to you by your health care provider. Make sure you discuss any questions you have with your health care provider. Document Revised: 09/18/2021 Document Reviewed: 09/09/2021 [employer] Patient Education  2024 [employer]  

## 2023-11-14 NOTE — Progress Notes (Signed)
Virtual Visit Consent   [name], you are scheduled for a virtual visit with a [facility] provider today. Just as with appointments in the office, your consent must be obtained to participate. Your consent will be active for this visit and any virtual visit you may have with one of our providers in the next 365 days. If you have a MyChart account, a copy of this consent can be sent to you electronically.  As this is a virtual visit, video technology does not allow for your provider to perform a traditional examination. This may limit your provider's ability to fully assess your condition. If your provider identifies any concerns that need to be evaluated in person or the need to arrange testing (such as labs, EKG, etc.), we will make arrangements to do so. Although advances in technology are sophisticated, we cannot ensure that it will always work on either your end or our end. If the connection with a video visit is poor, the visit may have to be switched to a telephone visit. With either a video or telephone visit, we are not always able to ensure that we have a secure connection.  By engaging in this virtual visit, you consent to the provision of healthcare and authorize for your insurance to be billed (if applicable) for the services provided during this visit. Depending on your insurance coverage, you may receive a charge related to this service.  I need to obtain your verbal consent now. Are you willing to proceed with your visit today? [name] has provided verbal consent on 11/14/2023 for a virtual visit (video or telephone). Georgana Curio, FNP  Date: 11/14/2023 12:05 PM  Virtual Visit via Video Note   I, Georgana Curio, connected with  [name]  ([id number], 10/09/1986) on 11/14/23 at 12:00 PM EST by a video-enabled telemedicine application and verified that I am speaking with the correct person using two identifiers.  Location: Patient: Virtual Visit Location  Patient: Home Provider: Virtual Visit Location Provider: Home Office   I discussed the limitations of evaluation and management by telemedicine and the availability of in person appointments. The patient expressed understanding and agreed to proceed.    History of Present Illness: [name] is a 37 y.o. who identifies as a female who was assigned female at birth, and is being seen today for wheezing, sob, cough, no fever or colored mucus. Sx for a week worsening. Requests refill on albuterol and phenergan. 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     Methylphenidate Derivatives Other (See Comments)    Reaction:  Depression and anger  Neurontin [Gabapentin] Other (See Comments)    Reaction:  Dizziness    Prozac [Fluoxetine Hcl] Other (See Comments)    Reaction:  Anger    Methylphenidate Hcl     SAME AS PREVIOUS LISTED METHYLPHENIDATE   Medications:  Current Outpatient Medications:    acetaminophen (TYLENOL) 500 MG tablet, Take 2 tablets (1,000 mg total) by mouth every 8  (eight) hours as needed (pain)., Disp: 60 tablet, Rfl: 0   albuterol (VENTOLIN HFA) 108 (90 Base) MCG/ACT inhaler, Inhale 1-2 puffs into the lungs every 6 (six) hours as needed., Disp: 8 g, Rfl: 0   ALPRAZolam (XANAX) 1 MG tablet, Take 1 mg by mouth daily as needed., Disp: , Rfl:    amoxicillin-clavulanate (AUGMENTIN) 875-125 MG tablet, Take 1 tablet by mouth 2 (two) times daily., Disp: 14 tablet, Rfl: 0   ARIPiprazole (ABILIFY IM), Inject 300 mg into the muscle every 30 (thirty) days., Disp: , Rfl:    Blood Pressure Monitoring (BLOOD PRESSURE KIT) DEVI, 1 Device by Does not apply route once a week., Disp: 1 each, Rfl: 0   brompheniramine-pseudoephedrine-DM 30-2-10 MG/5ML syrup, Take 5 mLs by mouth 4 (four) times daily as needed., Disp: 120 mL, Rfl: 0   chlorproMAZINE (THORAZINE) 25 MG tablet, Take 25 mg by mouth at bedtime., Disp: , Rfl:    docusate sodium (COLACE) 100 MG capsule, Take 1 capsule (100 mg total) by mouth 2 (two) times daily., Disp: 30 capsule, Rfl: 0   doxycycline (VIBRA-TABS) 100 MG tablet, Take 1 tablet (100 mg total) by mouth 2 (two) times daily., Disp: 14 tablet, Rfl: 0   fluconazole (DIFLUCAN) 150 MG tablet, Take 1 tablet (150 mg total) by mouth once a week., Disp: 4 tablet, Rfl: 0   fluticasone (FLONASE) 50 MCG/ACT nasal spray, Place 2 sprays into both nostrils daily., Disp: 16 g, Rfl: 0   fluticasone-salmeterol (ADVAIR HFA) 115-21 MCG/ACT inhaler, Inhale 2 puffs into the lungs 2 (two) times daily., Disp: 1 each, Rfl: 1   guaiFENesin (TUSSIN MUCUS & CHEST CONGEST) 100 MG/5ML liquid, Take 5 mLs by mouth every 6 (six) hours as needed for cough or to loosen phlegm., Disp: 120 mL, Rfl: 0   ibuprofen (ADVIL) 800 MG tablet, Take 1 tablet (800 mg total) by mouth every 8 (eight) hours as needed., Disp: 30 tablet, Rfl: 0   levocetirizine (XYZAL) 5 MG tablet, Take 1 tablet (5 mg total) by mouth every evening., Disp: 30 tablet, Rfl: 0   montelukast (SINGULAIR) 10 MG tablet, Take 1 tablet  (10 mg total) by mouth at bedtime., Disp: 14 tablet, Rfl: 0   nystatin (MYCOSTATIN/NYSTOP) powder, Apply 1 Application topically 3 (three) times daily., Disp: 60 g, Rfl: 0   nystatin cream (MYCOSTATIN), Apply 1 Application topically 2 (two) times daily., Disp: 30 g, Rfl: 1   nystatin-triamcinolone ointment (MYCOLOG), Apply 1 Application topically 2 (two) times daily., Disp: 30 g, Rfl: 0   ondansetron (ZOFRAN-ODT) 4 MG disintegrating tablet, Take 1 tablet (4 mg total) by mouth every 8 (eight) hours as needed., Disp: 20 tablet, Rfl: 0   pantoprazole (PROTONIX) 40 MG tablet, Take 1 tablet (40 mg total) by mouth daily., Disp: 90 tablet, Rfl: 0   predniSONE (DELTASONE) 20 MG tablet, Take 1 tablet (20 mg total) by mouth 2 (two) times daily with a meal for 5 days., Disp: 10 tablet, Rfl: 0   promethazine (PHENERGAN) 12.5 MG tablet, Take 1 tablet (12.5 mg total) by mouth every 8 (eight) hours as needed for up  to 5 days for nausea or vomiting., Disp: 15 tablet, Rfl: 0   propranolol (INDERAL) 20 MG tablet, Take by mouth., Disp: , Rfl:    VRAYLAR 1.5 MG capsule, Take 1.5 mg by mouth daily., Disp: , Rfl:   Observations/Objective: Patient is well-developed, well-nourished in no acute distress.  Resting comfortably  at home.  Head is normocephalic, atraumatic.  No labored breathing.  Speech is clear and coherent with logical content.  Patient is alert and oriented at baseline.    Assessment and Plan: 1. Viral URI with cough - albuterol (VENTOLIN HFA) 108 (90 Base) MCG/ACT inhaler; Inhale 1-2 puffs into the lungs every 6 (six) hours as needed.  Dispense: 8 g; Refill: 0  Increase fluids, meds refilled, follow up with UC if sx worsen.   Follow Up Instructions: I discussed the assessment and treatment plan with the patient. The patient was provided an opportunity to ask questions and all were answered. The patient agreed with the plan and demonstrated an understanding of the instructions.  A copy of  instructions were sent to the patient via MyChart unless otherwise noted below.     The patient was advised to call back or seek an in-person evaluation if the symptoms worsen or if the condition fails to improve as anticipated.    Georgana Curio, FNP

## 2023-11-16 ENCOUNTER — Telehealth: Payer: Medicare HMO | Admitting: Physician Assistant

## 2023-11-16 ENCOUNTER — Ambulatory Visit (HOSPITAL_COMMUNITY): Payer: Medicare HMO

## 2023-11-16 DIAGNOSIS — J020 Streptococcal pharyngitis: Secondary | ICD-10-CM

## 2023-11-16 MED ORDER — LIDOCAINE VISCOUS HCL 2 % MT SOLN: OROMUCOSAL | 0 refills | Status: DC

## 2023-11-16 MED ORDER — AZITHROMYCIN 250 MG PO TABS: ORAL_TABLET | ORAL | 0 refills | Status: AC

## 2023-11-16 NOTE — Progress Notes (Signed)
Virtual Visit Consent   [name], you are scheduled for a virtual visit with a [facility] provider today. Just as with appointments in the office, your consent must be obtained to participate. Your consent will be active for this visit and any virtual visit you may have with one of our providers in the next 365 days. If you have a MyChart account, a copy of this consent can be sent to you electronically.  As this is a virtual visit, video technology does not allow for your provider to perform a traditional examination. This may limit your provider's ability to fully assess your condition. If your provider identifies any concerns that need to be evaluated in person or the need to arrange testing (such as labs, EKG, etc.), we will make arrangements to do so. Although advances in technology are sophisticated, we cannot ensure that it will always work on either your end or our end. If the connection with a video visit is poor, the visit may have to be switched to a telephone visit. With either a video or telephone visit, we are not always able to ensure that we have a secure connection.  By engaging in this virtual visit, you consent to the provision of healthcare and authorize for your insurance to be billed (if applicable) for the services provided during this visit. Depending on your insurance coverage, you may receive a charge related to this service.  I need to obtain your verbal consent now. Are you willing to proceed with your visit today? [name] has provided verbal consent on 11/16/2023 for a virtual visit (video or telephone). [name], PA-C  Date: 11/16/2023 9:54 AM  Virtual Visit via Video Note   I, [name], connected with  [name]  ([id number], 1986-07-07) on 11/16/23 at  9:45 AM EST by a video-enabled telemedicine application and verified that I am speaking with the correct person using two identifiers.  Location: Patient: Virtual Visit  Location Patient: Home Provider: Virtual Visit Location Provider: Home Office   I discussed the limitations of evaluation and management by telemedicine and the availability of in person appointments. The patient expressed understanding and agreed to proceed.    History of Present Illness: [name] is a 37 y.o. who identifies as a female who was assigned female at birth, and is being seen today for cough and congestion. Initially seen on 10/30/23, virtually, for similar symptoms and was given Augmentin x 7 days, Zofran and Ibuprofen. Seen again, virtually, on 11/01/23 and was prescribed Erythromycin ointment for possible bacterial eye infection. Seen again, virtually, on 11/14/23 for continued URI symptoms and prescribed Prednisone and Promethazine for nausea.   Patient returns again today virtually for continued coughing and vomiting. She is having increased sore throat.  Her son recently had strep throat.   She does have swelling, redness and white exudate on the left tonsil.  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     Methylphenidate Derivatives Other (See Comments)    Reaction:  Depression and anger    Neurontin [Gabapentin] Other (See Comments)    Reaction:  Dizziness    Prozac [Fluoxetine Hcl] Other (See Comments)    Reaction:  Anger    Methylphenidate Hcl     SAME AS PREVIOUS LISTED METHYLPHENIDATE   Medications:  Current Outpatient Medications:    acetaminophen (TYLENOL) 500 MG tablet, Take 2 tablets (1,000 mg total) by mouth every 8 (eight) hours as needed (pain)., Disp: 60 tablet, Rfl: 0   albuterol (VENTOLIN HFA) 108 (90 Base) MCG/ACT inhaler, Inhale 1-2 puffs into the lungs every 6 (six) hours as needed., Disp: 8 g, Rfl: 0   ALPRAZolam (XANAX) 1 MG tablet, Take 1 mg by mouth daily as needed., Disp: , Rfl:    ARIPiprazole (ABILIFY IM), Inject 300 mg into the muscle every 30 (thirty) days., Disp: , Rfl:    azithromycin (ZITHROMAX) 250 MG tablet, Take 2 tablets on day 1, then 1 tablet daily on days 2 through 5, Disp: 6 tablet, Rfl: 0   Blood Pressure Monitoring (BLOOD PRESSURE KIT) DEVI, 1 Device by Does not apply route once a week., Disp: 1 each, Rfl: 0   brompheniramine-pseudoephedrine-DM 30-2-10 MG/5ML syrup, Take 5 mLs by mouth 4 (four) times daily as needed., Disp: 120 mL, Rfl: 0   chlorproMAZINE (THORAZINE) 25 MG tablet, Take 25 mg by mouth at bedtime., Disp: , Rfl:    docusate sodium (COLACE) 100 MG capsule, Take 1 capsule (100 mg total) by mouth 2 (two) times daily., Disp: 30 capsule, Rfl: 0   fluconazole (DIFLUCAN) 150 MG tablet, Take 1 tablet (150 mg total) by mouth once a week., Disp: 4 tablet, Rfl: 0   fluticasone (FLONASE) 50 MCG/ACT nasal spray, Place 2 sprays into both nostrils daily., Disp: 16 g, Rfl: 0   fluticasone-salmeterol (ADVAIR HFA) 115-21 MCG/ACT inhaler, Inhale 2 puffs into the lungs 2 (two) times daily., Disp: 1 each, Rfl: 1   guaiFENesin (TUSSIN MUCUS & CHEST CONGEST) 100 MG/5ML liquid, Take 5 mLs by mouth every 6 (six) hours as needed for  cough or to loosen phlegm., Disp: 120 mL, Rfl: 0   ibuprofen (ADVIL) 800 MG tablet, Take 1 tablet (800 mg total) by mouth every 8 (eight) hours as needed., Disp: 30 tablet, Rfl: 0   levocetirizine (XYZAL) 5 MG tablet, Take 1 tablet (5 mg total) by mouth every evening., Disp: 30 tablet, Rfl: 0   lidocaine (XYLOCAINE) 2 % solution, Swallow 5-10 mL every 6 hours as needed for sore throat, Disp: 100 mL, Rfl: 0   montelukast (SINGULAIR) 10 MG tablet, Take 1 tablet (10 mg total) by mouth at bedtime., Disp: 14 tablet, Rfl: 0   nystatin (MYCOSTATIN/NYSTOP) powder, Apply 1 Application topically 3 (three) times daily., Disp: 60 g, Rfl: 0   nystatin cream (MYCOSTATIN), Apply 1 Application topically 2 (two) times daily., Disp: 30 g, Rfl: 1   nystatin-triamcinolone ointment (MYCOLOG), Apply 1 Application topically 2 (two) times daily., Disp: 30 g, Rfl: 0   ondansetron (ZOFRAN-ODT) 4 MG disintegrating tablet, Take 1 tablet (4 mg total) by mouth every 8 (eight) hours as needed., Disp:  20 tablet, Rfl: 0   pantoprazole (PROTONIX) 40 MG tablet, Take 1 tablet (40 mg total) by mouth daily., Disp: 90 tablet, Rfl: 0   predniSONE (DELTASONE) 20 MG tablet, Take 1 tablet (20 mg total) by mouth 2 (two) times daily with a meal for 5 days., Disp: 10 tablet, Rfl: 0   promethazine (PHENERGAN) 12.5 MG tablet, Take 1 tablet (12.5 mg total) by mouth every 8 (eight) hours as needed for up to 5 days for nausea or vomiting., Disp: 15 tablet, Rfl: 0   propranolol (INDERAL) 20 MG tablet, Take by mouth., Disp: , Rfl:    VRAYLAR 1.5 MG capsule, Take 1.5 mg by mouth daily., Disp: , Rfl:   Observations/Objective: Patient is well-developed, well-nourished in no acute distress.  Resting comfortably at home.  Head is normocephalic, atraumatic.  No labored breathing.  Speech is clear and coherent with logical content.  Patient is alert and oriented at baseline.    Assessment and Plan: 1. Strep throat - azithromycin (ZITHROMAX) 250 MG  tablet; Take 2 tablets on day 1, then 1 tablet daily on days 2 through 5  Dispense: 6 tablet; Refill: 0 - lidocaine (XYLOCAINE) 2 % solution; Swallow 5-10 mL every 6 hours as needed for sore throat  Dispense: 100 mL; Refill: 0  - Suspect strep throat - Azithromycin prescribed - Viscous lidocaine for sore throat - Tylenol and Ibuprofen alternating every 4 hours - Salt water gargles - Chloraseptic spray - Liquid and soft food diet - She can use her Promethazine for nausea and vomiting - Push fluids - New toothbrush in 3 days - Seek in person evaluation if not improving or if symptoms worsen   Follow Up Instructions: I discussed the assessment and treatment plan with the patient. The patient was provided an opportunity to ask questions and all were answered. The patient agreed with the plan and demonstrated an understanding of the instructions.  A copy of instructions were sent to the patient via MyChart unless otherwise noted below.    The patient was advised to call back or seek an in-person evaluation if the symptoms worsen or if the condition fails to improve as anticipated.    [name], PA-C

## 2023-11-16 NOTE — Patient Instructions (Signed)
[name], thank you for joining [name], PA-C for today's virtual visit.  While this provider is not your primary care provider (PCP), if your PCP is located in our provider database this encounter information will be shared with them immediately following your visit.   A [facility] MyChart account gives you access to today's visit and all your visits, tests, and labs performed at [facility] " click here if you don't have a [facility] MyChart account or go to [email or web address]  Consent: (Patient) [name] provided verbal consent for this virtual visit at the beginning of the encounter.  Current Medications:  Current Outpatient Medications:    azithromycin (ZITHROMAX) 250 MG tablet, Take 2 tablets on day 1, then 1 tablet daily on days 2 through 5, Disp: 6 tablet, Rfl: 0   lidocaine (XYLOCAINE) 2 % solution, Swallow 5-10 mL every 6 hours as needed for sore throat, Disp: 100 mL, Rfl: 0   acetaminophen (TYLENOL) 500 MG tablet, Take 2 tablets (1,000 mg total) by mouth every 8 (eight) hours as needed (pain)., Disp: 60 tablet, Rfl: 0   albuterol (VENTOLIN HFA) 108 (90 Base) MCG/ACT inhaler, Inhale 1-2 puffs into the lungs every 6 (six) hours as needed., Disp: 8 g, Rfl: 0   ALPRAZolam (XANAX) 1 MG tablet, Take 1 mg by mouth daily as needed., Disp: , Rfl:    ARIPiprazole (ABILIFY IM), Inject 300 mg into the muscle every 30 (thirty) days., Disp: , Rfl:    Blood Pressure Monitoring (BLOOD PRESSURE KIT) DEVI, 1 Device by Does not apply route once a week., Disp: 1 each, Rfl: 0   brompheniramine-pseudoephedrine-DM 30-2-10 MG/5ML syrup, Take 5 mLs by mouth 4 (four) times daily as needed., Disp: 120 mL, Rfl: 0   chlorproMAZINE (THORAZINE) 25 MG tablet, Take 25 mg by mouth at bedtime., Disp: , Rfl:    docusate sodium (COLACE) 100 MG capsule, Take 1 capsule (100 mg total) by mouth 2 (two) times daily., Disp: 30 capsule, Rfl: 0   fluconazole (DIFLUCAN) 150 MG  tablet, Take 1 tablet (150 mg total) by mouth once a week., Disp: 4 tablet, Rfl: 0   fluticasone (FLONASE) 50 MCG/ACT nasal spray, Place 2 sprays into both nostrils daily., Disp: 16 g, Rfl: 0   fluticasone-salmeterol (ADVAIR HFA) 115-21 MCG/ACT inhaler, Inhale 2 puffs into the lungs 2 (two) times daily., Disp: 1 each, Rfl: 1   guaiFENesin (TUSSIN MUCUS & CHEST CONGEST) 100 MG/5ML liquid, Take 5 mLs by mouth every 6 (six) hours as needed for cough or to loosen phlegm., Disp: 120 mL, Rfl: 0   ibuprofen (ADVIL) 800 MG tablet, Take 1 tablet (800 mg total) by mouth every 8 (eight) hours as needed., Disp: 30 tablet, Rfl: 0   levocetirizine (XYZAL) 5 MG tablet, Take 1 tablet (5 mg total) by mouth every evening., Disp: 30 tablet, Rfl: 0   montelukast (SINGULAIR) 10 MG tablet, Take 1 tablet (10 mg total) by mouth at bedtime., Disp: 14 tablet, Rfl: 0   nystatin (MYCOSTATIN/NYSTOP) powder, Apply 1 Application topically 3 (three) times daily., Disp: 60 g, Rfl: 0   nystatin cream (MYCOSTATIN), Apply 1 Application topically 2 (two) times daily., Disp: 30 g, Rfl: 1   nystatin-triamcinolone ointment (MYCOLOG), Apply 1 Application topically 2 (two) times daily., Disp: 30 g, Rfl: 0   ondansetron (ZOFRAN-ODT) 4 MG disintegrating tablet, Take 1 tablet (4 mg total) by mouth every 8 (eight) hours as needed., Disp: 20 tablet, Rfl: 0   pantoprazole (PROTONIX)  40 MG tablet, Take 1 tablet (40 mg total) by mouth daily., Disp: 90 tablet, Rfl: 0   predniSONE (DELTASONE) 20 MG tablet, Take 1 tablet (20 mg total) by mouth 2 (two) times daily with a meal for 5 days., Disp: 10 tablet, Rfl: 0   promethazine (PHENERGAN) 12.5 MG tablet, Take 1 tablet (12.5 mg total) by mouth every 8 (eight) hours as needed for up to 5 days for nausea or vomiting., Disp: 15 tablet, Rfl: 0   propranolol (INDERAL) 20 MG tablet, Take by mouth., Disp: , Rfl:    VRAYLAR 1.5 MG capsule, Take 1.5 mg by mouth daily., Disp: , Rfl:    Medications ordered in this  encounter:  Meds ordered this encounter  Medications   azithromycin (ZITHROMAX) 250 MG tablet    Sig: Take 2 tablets on day 1, then 1 tablet daily on days 2 through 5    Dispense:  6 tablet    Refill:  0    Order Specific Question:   Supervising Provider    Answer:   Merrilee Jansky [[id number]]   lidocaine (XYLOCAINE) 2 % solution    Sig: Swallow 5-10 mL every 6 hours as needed for sore throat    Dispense:  100 mL    Refill:  0    Order Specific Question:   Supervising Provider    Answer:   Merrilee Jansky [id number]     *If you need refills on other medications prior to your next appointment, please contact your pharmacy*  Follow-Up: Call back or seek an in-person evaluation if the symptoms worsen or if the condition fails to improve as anticipated.  [facility] [phone]  Other Instructions Strep Throat, Adult Strep throat is an infection in the throat that is caused by bacteria. It is common during the cold months of the year. It mostly affects children who are 50-74 years old. However, people of all ages can get it at any time of the year. This infection spreads from person to person (is contagious) through coughing, sneezing, or having close contact. Your health care provider may use other names to describe the infection. When strep throat affects the tonsils, it is called tonsillitis. When it affects the back of the throat, it is called pharyngitis. What are the causes? This condition is caused by the Streptococcus pyogenes bacteria. What increases the risk? You are more likely to develop this condition if: You care for school-age children, or are around school-age children. Children are more likely to get strep throat and may spread it to others. You spend time in crowded places where the infection can spread easily. You have close contact with someone who has strep throat. What are the signs or symptoms? Symptoms of this condition include: Fever or  chills. Redness, swelling, or pain in the tonsils or throat. Pain or difficulty when swallowing. White or yellow spots on the tonsils or throat. Tender glands in the neck and under the jaw. Bad smelling breath. Red rash all over the body. This is rare. How is this diagnosed? This condition is diagnosed by tests that check for the presence and the amount of bacteria that cause strep throat. They are: Rapid strep test. Your throat is swabbed and checked for the presence of bacteria. Results are usually ready in minutes. Throat culture test. Your throat is swabbed. The sample is placed in a cup that allows infections to grow. Results are usually ready in 1 or 2 days. How is this  treated? This condition may be treated with: Medicines that kill germs (antibiotics). Medicines that relieve pain or fever. These include: Ibuprofen or acetaminophen. Aspirin, only for people who are over the age of 78. Throat lozenges. Throat sprays. Follow these instructions at home: Medicines  Take over-the-counter and prescription medicines only as told by your health care provider. Take your antibiotic medicine as told by your health care provider. Do not stop taking the antibiotic even if you start to feel better. Eating and drinking  If you have trouble swallowing, try eating soft foods until your sore throat feels better. Drink enough fluid to keep your urine pale yellow. To help relieve pain, you may have: Warm fluids, such as soup and tea. Cold fluids, such as frozen desserts or popsicles. General instructions Gargle with a salt-water mixture 3-4 times a day or as needed. To make a salt-water mixture, completely dissolve -1 tsp (3-6 g) of salt in 1 cup (237 mL) of warm water. Get plenty of rest. Stay home from work or school until you have been taking antibiotics for 24 hours. Do not use any products that contain nicotine or tobacco. These products include cigarettes, chewing tobacco, and vaping  devices, such as e-cigarettes. If you need help quitting, ask your health care provider. It is up to you to get your test results. Ask your health care provider, or the department that is doing the test, when your results will be ready. Keep all follow-up visits. This is important. How is this prevented?  Do not share food, drinking cups, or personal items that could cause the infection to spread to other people. Wash your hands often with soap and water for at least 20 seconds. If soap and water are not available, use hand sanitizer. Make sure that all people in your house wash their hands well. Have family members tested if they have a sore throat or fever. They may need an antibiotic if they have strep throat. Contact a health care provider if: You have swelling in your neck that keeps getting bigger. You develop a rash, cough, or earache. You cough up a thick mucus that is green, yellow-brown, or bloody. You have pain or discomfort that does not get better with medicine. Your symptoms seem to be getting worse. You have a fever. Get help right away if: You have new symptoms, such as vomiting, severe headache, stiff or painful neck, chest pain, or shortness of breath. You have severe throat pain, drooling, or changes in your voice. You have swelling of the neck, or the skin on the neck becomes red and tender. You have signs of dehydration, such as tiredness (fatigue), dry mouth, and decreased urination. You become increasingly sleepy, or you cannot wake up completely. Your joints become red or painful. These symptoms may represent a serious problem that is an emergency. Do not wait to see if the symptoms will go away. Get medical help right away. Call your local emergency services (911 in the [place] Do not drive yourself to the hospital. Summary Strep throat is an infection in the throat that is caused by the Streptococcus pyogenes bacteria. This infection is spread from person to person  (is contagious) through coughing, sneezing, or having close contact. Take your medicines, including antibiotics, as told by your health care provider. Do not stop taking the antibiotic even if you start to feel better. To prevent the spread of germs, wash your hands well with soap and water. Have others do the same. Do not share  food, drinking cups, or personal items. Get help right away if you have new symptoms, such as vomiting, severe headache, stiff or painful neck, chest pain, or shortness of breath. This information is not intended to replace advice given to you by your health care provider. Make sure you discuss any questions you have with your health care provider. Document Revised: 03/26/2021 Document Reviewed: 03/26/2021 Elsevier Patient Education  2024 Elsevier Inc.    If you have been instructed to have an in-person evaluation today at a local Urgent Care facility, please use the link below. It will take you to a list of all of our available [facility], including address, phone number and hours of operation. Please do not delay care.  [facility]  If you or a family member do not have a primary care provider, use the link below to schedule a visit and establish care. When you choose a [facility] primary care physician or advanced practice provider, you gain a long-term partner in health. Find a Primary Care Provider  Learn more about [facility]'s in-office and virtual care options: [facility] Get Care Now

## 2023-11-17 ENCOUNTER — Ambulatory Visit: Payer: Medicare HMO

## 2023-11-18 DIAGNOSIS — F3162 Bipolar disorder, current episode mixed, moderate: Secondary | ICD-10-CM | POA: Diagnosis not present

## 2023-11-18 DIAGNOSIS — F418 Other specified anxiety disorders: Secondary | ICD-10-CM | POA: Diagnosis not present

## 2023-12-03 ENCOUNTER — Ambulatory Visit: Payer: Medicare HMO | Admitting: Internal Medicine

## 2023-12-03 DIAGNOSIS — F3162 Bipolar disorder, current episode mixed, moderate: Secondary | ICD-10-CM | POA: Diagnosis not present

## 2023-12-03 DIAGNOSIS — F418 Other specified anxiety disorders: Secondary | ICD-10-CM | POA: Diagnosis not present

## 2023-12-10 NOTE — Progress Notes (Deleted)
[facility]IMARY [facility]ARE [facility] [address] [place] [address] Dept: [phone] Dept Fax: [phone]  New Patient Office Visit  Subjective:   [name] 11-28-1986 12/11/2023  No chief complaint on file.   HPI: [name] presents today to establish care at [facility]. Introduced to Publishing rights manager role and practice setting.  All questions answered.  Concerns: See below   Discussed the use of AI scribe software for clinical note transcription with the patient, who gave verbal consent to proceed.  History of Present Illness             The following portions of the patient's history were reviewed and updated as appropriate: past medical history, past surgical history, family history, social history, allergies, medications, and problem list.   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    Anxiety    Asthma    inhaler PRN-hasn't used inhaler in months   Attention deficit hyperactivity disorder    Bipolar disorder [facility])    hospitalized as a teen for  suicidal ideations and cutting   COVID-19 affecting puerperium 12/19/2021   Depression    no complaints, doing well   Gastritis    GERD (gastroesophageal reflux disease)    History of gestational diabetes in prior pregnancy, currently pregnant 10/13/2017   History of gestational hypertension 07/03/2021   History of pre-eclampsia in prior pregnancy, currently pregnant 07/03/2021   History of preterm delivery after IOL for preeclampsia, currently pregnant 10/13/2017   Not a 17P candidate   Hx of varicella    Mild preeclampsia 02/09/2016   Nausea and vomiting during pregnancy 10/04/2019   Vaginal Pap smear, abnormal    Past Surgical History:  Procedure Laterality Date   DILATION AND EVACUATION N/A 01/08/2021   Procedure: DILATATION AND EVACUATION;  Surgeon: Hermina Staggers, MD;  Location: [facility];  Service: Gynecology;  Laterality: N/A;   EYE MUSCLE SURGERY Bilateral    07/29/12   LAPAROSCOPIC TUBAL LIGATION N/A 04/01/2022   Procedure: LAPAROSCOPIC TUBAL LIGATION;  Surgeon: Schurz Bing, MD;  Location: [facility];  Service: Gynecology;  Laterality: N/A;   MOUTH SURGERY     PLANTAR FASCIA SURGERY     WISDOM TOOTH EXTRACTION     Family History  Problem Relation Age of Onset   Depression Mother    Colon cancer Mother    Colon polyps Mother    Depression Father    Lung cancer Father    Depression Brother    Liver disease Other    Kidney disease Other     Current Outpatient Medications:    acetaminophen (TYLENOL) 500 MG tablet, Take 2 tablets (1,000 mg  total) by mouth every 8 (eight) hours as needed (pain)., Disp: 60 tablet, Rfl: 0   albuterol (VENTOLIN HFA) 108 (90 Base) MCG/ACT inhaler, Inhale 1-2 puffs into the lungs every 6 (six) hours as needed., Disp: 8 g, Rfl: 0   ALPRAZolam (XANAX) 1 MG tablet, Take 1 mg by mouth daily as needed., Disp: , Rfl:    ARIPiprazole (ABILIFY IM), Inject 300 mg into the muscle every 30 (thirty) days., Disp: , Rfl:     Blood Pressure Monitoring (BLOOD PRESSURE KIT) DEVI, 1 Device by Does not apply route once a week., Disp: 1 each, Rfl: 0   brompheniramine-pseudoephedrine-DM 30-2-10 MG/5ML syrup, Take 5 mLs by mouth 4 (four) times daily as needed., Disp: 120 mL, Rfl: 0   chlorproMAZINE (THORAZINE) 25 MG tablet, Take 25 mg by mouth at bedtime., Disp: , Rfl:    docusate sodium (COLACE) 100 MG capsule, Take 1 capsule (100 mg total) by mouth 2 (two) times daily., Disp: 30 capsule, Rfl: 0   fluconazole (DIFLUCAN) 150 MG tablet, Take 1 tablet (150 mg total) by mouth once a week., Disp: 4 tablet, Rfl: 0   fluticasone (FLONASE) 50 MCG/ACT nasal spray, Place 2 sprays into both nostrils daily., Disp: 16 g, Rfl: 0   fluticasone-salmeterol (ADVAIR HFA) 115-21 MCG/ACT inhaler, Inhale 2 puffs into the lungs 2 (two) times daily., Disp: 1 each, Rfl: 1   guaiFENesin (TUSSIN MUCUS & CHEST CONGEST) 100 MG/5ML liquid, Take 5 mLs by mouth every 6 (six) hours as needed for cough or to loosen phlegm., Disp: 120 mL, Rfl: 0   ibuprofen (ADVIL) 800 MG tablet, Take 1 tablet (800 mg total) by mouth every 8 (eight) hours as needed., Disp: 30 tablet, Rfl: 0   levocetirizine (XYZAL) 5 MG tablet, Take 1 tablet (5 mg total) by mouth every evening., Disp: 30 tablet, Rfl: 0   lidocaine (XYLOCAINE) 2 % solution, Swallow 5-10 mL every 6 hours as needed for sore throat, Disp: 100 mL, Rfl: 0   montelukast (SINGULAIR) 10 MG tablet, Take 1 tablet (10 mg total) by mouth at bedtime., Disp: 14 tablet, Rfl: 0   nystatin (MYCOSTATIN/NYSTOP) powder, Apply 1 Application topically 3 (three) times daily., Disp: 60 g, Rfl: 0   nystatin cream (MYCOSTATIN), Apply 1 Application topically 2 (two) times daily., Disp: 30 g, Rfl: 1   nystatin-triamcinolone ointment (MYCOLOG), Apply 1 Application topically 2 (two) times daily., Disp: 30 g, Rfl: 0   ondansetron (ZOFRAN-ODT) 4 MG disintegrating tablet, Take 1 tablet (4 mg total) by mouth every 8 (eight) hours as needed.,  Disp: 20 tablet, Rfl: 0   pantoprazole (PROTONIX) 40 MG tablet, Take 1 tablet (40 mg total) by mouth daily., Disp: 90 tablet, Rfl: 0   promethazine (PHENERGAN) 12.5 MG tablet, Take 1 tablet (12.5 mg total) by mouth every 8 (eight) hours as needed for up to 5 days for nausea or vomiting., Disp: 15 tablet, Rfl: 0   propranolol (INDERAL) 20 MG tablet, Take by mouth., Disp: , Rfl:    VRAYLAR 1.5 MG capsule, Take 1.5 mg by mouth daily., Disp: , Rfl:  Allergies  Allergen Reactions   Depakote [Divalproex Sodium] Other (See Comments)    Pt states that this medication makes her blood levels toxic.     Methylphenidate Derivatives Other (See Comments)    Reaction:  Depression and anger    Neurontin [Gabapentin] Other (See Comments)    Reaction:  Dizziness    Prozac [Fluoxetine Hcl] Other (See Comments)    Reaction:  Anger    Methylphenidate Hcl     SAME AS PREVIOUS LISTED METHYLPHENIDATE    ROS: A complete ROS was performed with pertinent positives/negatives noted in the HPI. The remainder of the ROS are negative.   Objective:   There were no vitals filed for this visit.  GENERAL: Well-appearing, in NAD. Well nourished.  SKIN: Pink, warm and dry. No rash, lesion, ulceration, or ecchymoses.  NECK: Trachea midline. Full ROM w/o pain or tenderness. No lymphadenopathy.  RESPIRATORY: Chest wall symmetrical. Respirations even and non-labored. Breath sounds clear to auscultation bilaterally.  CARDIAC: S1, S2 present, regular rate and rhythm. Peripheral pulses 2+ bilaterally.  MSK: Muscle tone and strength appropriate for age. Joints w/o tenderness, redness, or swelling.  EXTREMITIES: Without clubbing, cyanosis, or edema.  NEUROLOGIC: No motor or sensory deficits. Steady, even gait.  PSYCH/MENTAL STATUS: Alert, oriented x 3. Cooperative, appropriate mood and affect.   Health Maintenance Due  Topic Date Due   Medicare Annual Wellness (AWV)  Never done   DTaP/Tdap/Td (2 - Td or Tdap) 01/10/2022    INFLUENZA VACCINE  07/16/2023   COVID-19 Vaccine (1 - 2024-25 season) Never done    No results found for any visits on 12/11/23.  Assessment & Plan:  Assessment and Plan               There are no diagnoses linked to this encounter.  No orders of the defined types were placed in this encounter.  No orders of the defined types were placed in this encounter.   No follow-ups on file.   Salvatore Decent, FNP

## 2023-12-11 ENCOUNTER — Ambulatory Visit: Payer: Medicare HMO | Admitting: Internal Medicine

## 2023-12-17 ENCOUNTER — Ambulatory Visit: Payer: Medicare HMO | Admitting: Internal Medicine

## 2024-01-10 ENCOUNTER — Telehealth: Payer: Medicare Other

## 2024-01-11 ENCOUNTER — Telehealth: Payer: Medicare Other | Admitting: Physician Assistant

## 2024-01-11 DIAGNOSIS — J4531 Mild persistent asthma with (acute) exacerbation: Secondary | ICD-10-CM

## 2024-01-11 MED ORDER — PREDNISONE 20 MG PO TABS: 40.0000 mg | ORAL_TABLET | Freq: Every day | ORAL | 0 refills | Status: DC

## 2024-01-11 MED ORDER — FLUTICASONE-SALMETEROL 115-21 MCG/ACT IN AERO: 2.0000 | INHALATION_SPRAY | Freq: Two times a day (BID) | RESPIRATORY_TRACT | 1 refills | Status: AC

## 2024-01-11 MED ORDER — ALBUTEROL SULFATE HFA 108 (90 BASE) MCG/ACT IN AERS: 1.0000 | INHALATION_SPRAY | Freq: Four times a day (QID) | RESPIRATORY_TRACT | 0 refills | Status: DC | PRN

## 2024-01-11 NOTE — Progress Notes (Signed)
Virtual Visit Consent   [name], you are scheduled for a virtual visit with a [facility] provider today. Just as with appointments in the office, your consent must be obtained to participate. Your consent will be active for this visit and any virtual visit you may have with one of our providers in the next 365 days. If you have a MyChart account, a copy of this consent can be sent to you electronically.  As this is a virtual visit, video technology does not allow for your provider to perform a traditional examination. This may limit your provider's ability to fully assess your condition. If your provider identifies any concerns that need to be evaluated in person or the need to arrange testing (such as labs, EKG, etc.), we will make arrangements to do so. Although advances in technology are sophisticated, we cannot ensure that it will always work on either your end or our end. If the connection with a video visit is poor, the visit may have to be switched to a telephone visit. With either a video or telephone visit, we are not always able to ensure that we have a secure connection.  By engaging in this virtual visit, you consent to the provision of healthcare and authorize for your insurance to be billed (if applicable) for the services provided during this visit. Depending on your insurance coverage, you may receive a charge related to this service.  I need to obtain your verbal consent now. Are you willing to proceed with your visit today? [name] has provided verbal consent on 01/11/2024 for a virtual visit (video or telephone). Nichole Loveless, PA-C  Date: 01/11/2024 2:14 PM  Virtual Visit via Video Note   I, [name], connected with  [name]  ([id number], 04-03-1986) on 01/11/24 at  2:00 PM EST by a video-enabled telemedicine application and verified that I am speaking with the correct person using two identifiers.  Location: Patient: Virtual Visit  Location Patient: Home Provider: Virtual Visit Location Provider: Home Office   I discussed the limitations of evaluation and management by telemedicine and the availability of in person appointments. The patient expressed understanding and agreed to proceed.    History of Present Illness: [name] is a 38 y.o. who identifies as a female who was assigned female at birth, and is being seen today for asthma exacerbation. Started to flare yesterday. Denies fevers, chills. Mild coughing. Having chest tightness, mild SOB, and wheezing. Denies difficulty breathing.    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     Methylphenidate Derivatives Other (See Comments)    Reaction:  Depression  and anger    Neurontin [Gabapentin] Other (See Comments)    Reaction:  Dizziness    Prozac [Fluoxetine Hcl] Other (See Comments)    Reaction:  Anger    Methylphenidate Hcl     SAME AS PREVIOUS LISTED METHYLPHENIDATE   Medications:  Current Outpatient Medications:    predniSONE (DELTASONE) 20 MG tablet, Take 2  tablets (40 mg total) by mouth daily with breakfast., Disp: 14 tablet, Rfl: 0   acetaminophen (TYLENOL) 500 MG tablet, Take 2 tablets (1,000 mg total) by mouth every 8 (eight) hours as needed (pain)., Disp: 60 tablet, Rfl: 0   albuterol (VENTOLIN HFA) 108 (90 Base) MCG/ACT inhaler, Inhale 1-2 puffs into the lungs every 6 (six) hours as needed., Disp: 8 g, Rfl: 0   ALPRAZolam (XANAX) 1 MG tablet, Take 1 mg by mouth daily as needed., Disp: , Rfl:    ARIPiprazole (ABILIFY IM), Inject 300 mg into the muscle every 30 (thirty) days., Disp: , Rfl:    Blood Pressure Monitoring (BLOOD PRESSURE KIT) DEVI, 1 Device by Does not apply route once a week., Disp: 1 each, Rfl: 0   chlorproMAZINE (THORAZINE) 25 MG tablet, Take 25 mg by mouth at bedtime., Disp: , Rfl:    docusate sodium (COLACE) 100 MG capsule, Take 1 capsule (100 mg total) by mouth 2 (two) times daily., Disp: 30 capsule, Rfl: 0   fluticasone (FLONASE) 50 MCG/ACT nasal spray, Place 2 sprays into both nostrils daily., Disp: 16 g, Rfl: 0   fluticasone-salmeterol (ADVAIR HFA) 115-21 MCG/ACT inhaler, Inhale 2 puffs into the lungs 2 (two) times daily., Disp: 1 each, Rfl: 1   ibuprofen (ADVIL) 800 MG tablet, Take 1 tablet (800 mg total) by mouth every 8 (eight) hours as needed., Disp: 30 tablet, Rfl: 0   levocetirizine (XYZAL) 5 MG tablet, Take 1 tablet (5 mg total) by mouth every evening., Disp: 30 tablet, Rfl: 0   lidocaine (XYLOCAINE) 2 % solution, Swallow 5-10 mL every 6 hours as needed for sore throat, Disp: 100 mL, Rfl: 0   montelukast (SINGULAIR) 10 MG tablet, Take 1 tablet (10 mg total) by mouth at bedtime., Disp: 14 tablet, Rfl: 0   nystatin (MYCOSTATIN/NYSTOP) powder, Apply 1 Application topically 3 (three) times daily., Disp: 60 g, Rfl: 0   nystatin cream (MYCOSTATIN), Apply 1 Application topically 2 (two) times daily., Disp: 30 g, Rfl: 1   nystatin-triamcinolone ointment (MYCOLOG), Apply 1 Application topically 2 (two) times daily., Disp: 30 g,  Rfl: 0   ondansetron (ZOFRAN-ODT) 4 MG disintegrating tablet, Take 1 tablet (4 mg total) by mouth every 8 (eight) hours as needed., Disp: 20 tablet, Rfl: 0   pantoprazole (PROTONIX) 40 MG tablet, Take 1 tablet (40 mg total) by mouth daily., Disp: 90 tablet, Rfl: 0   propranolol (INDERAL) 20 MG tablet, Take by mouth., Disp: , Rfl:    VRAYLAR 1.5 MG capsule, Take 1.5 mg by mouth daily., Disp: , Rfl:   Observations/Objective: Patient is well-developed, well-nourished in no acute distress.  Resting comfortably at home.  Head is normocephalic, atraumatic.  No labored breathing.  Speech is clear and coherent with logical content.  Patient is alert and oriented at baseline.    Assessment and Plan: 1. Mild persistent asthma with acute exacerbation (Primary) - predniSONE (DELTASONE) 20 MG tablet; Take 2 tablets (40 mg total) by mouth daily with breakfast.  Dispense: 14 tablet; Refill: 0 - albuterol (VENTOLIN HFA) 108 (90 Base) MCG/ACT inhaler; Inhale 1-2 puffs into the lungs every 6 (  six) hours as needed.  Dispense: 8 g; Refill: 0 - fluticasone-salmeterol (ADVAIR HFA) 115-21 MCG/ACT inhaler; Inhale 2 puffs into the lungs 2 (two) times daily.  Dispense: 1 each; Refill: 1  - Worsening despite using Albuterol - Will treat with Prednisone - Albuterol and Advair refilled - Can continue Mucinex  - Push fluids.  - Rest.  - Steam and humidifier can help - Seek in person evaluation if worsening or symptoms fail to improve    Follow Up Instructions: I discussed the assessment and treatment plan with the patient. The patient was provided an opportunity to ask questions and all were answered. The patient agreed with the plan and demonstrated an understanding of the instructions.  A copy of instructions were sent to the patient via MyChart unless otherwise noted below.    The patient was advised to call back or seek an in-person evaluation if the symptoms worsen or if the condition fails to improve as  anticipated.    Nichole Loveless, PA-C

## 2024-01-11 NOTE — Patient Instructions (Signed)
[name], thank you for joining Nichole Loveless, PA-C for today's virtual visit.  While this provider is not your primary care provider (PCP), if your PCP is located in our provider database this encounter information will be shared with them immediately following your visit.   A [facility] MyChart account gives you access to today's visit and all your visits, tests, and labs performed at [facility] " click here if you don't have a [facility] MyChart account or go to [email or web address]  Consent: (Patient) [name] provided verbal consent for this virtual visit at the beginning of the encounter.  Current Medications:  Current Outpatient Medications:    predniSONE (DELTASONE) 20 MG tablet, Take 2 tablets (40 mg total) by mouth daily with breakfast., Disp: 14 tablet, Rfl: 0   acetaminophen (TYLENOL) 500 MG tablet, Take 2 tablets (1,000 mg total) by mouth every 8 (eight) hours as needed (pain)., Disp: 60 tablet, Rfl: 0   albuterol (VENTOLIN HFA) 108 (90 Base) MCG/ACT inhaler, Inhale 1-2 puffs into the lungs every 6 (six) hours as needed., Disp: 8 g, Rfl: 0   ALPRAZolam (XANAX) 1 MG tablet, Take 1 mg by mouth daily as needed., Disp: , Rfl:    ARIPiprazole (ABILIFY IM), Inject 300 mg into the muscle every 30 (thirty) days., Disp: , Rfl:    Blood Pressure Monitoring (BLOOD PRESSURE KIT) DEVI, 1 Device by Does not apply route once a week., Disp: 1 each, Rfl: 0   chlorproMAZINE (THORAZINE) 25 MG tablet, Take 25 mg by mouth at bedtime., Disp: , Rfl:    docusate sodium (COLACE) 100 MG capsule, Take 1 capsule (100 mg total) by mouth 2 (two) times daily., Disp: 30 capsule, Rfl: 0   fluticasone (FLONASE) 50 MCG/ACT nasal spray, Place 2 sprays into both nostrils daily., Disp: 16 g, Rfl: 0   fluticasone-salmeterol (ADVAIR HFA) 115-21 MCG/ACT inhaler, Inhale 2 puffs into the lungs 2 (two) times daily., Disp: 1 each, Rfl: 1   ibuprofen (ADVIL) 800 MG tablet, Take 1  tablet (800 mg total) by mouth every 8 (eight) hours as needed., Disp: 30 tablet, Rfl: 0   levocetirizine (XYZAL) 5 MG tablet, Take 1 tablet (5 mg total) by mouth every evening., Disp: 30 tablet, Rfl: 0   lidocaine (XYLOCAINE) 2 % solution, Swallow 5-10 mL every 6 hours as needed for sore throat, Disp: 100 mL, Rfl: 0   montelukast (SINGULAIR) 10 MG tablet, Take 1 tablet (10 mg total) by mouth at bedtime., Disp: 14 tablet, Rfl: 0   nystatin (MYCOSTATIN/NYSTOP) powder, Apply 1 Application topically 3 (three) times daily., Disp: 60 g, Rfl: 0   nystatin cream (MYCOSTATIN), Apply 1 Application topically 2 (two) times daily., Disp: 30 g, Rfl: 1   nystatin-triamcinolone ointment (MYCOLOG), Apply 1 Application topically 2 (two) times daily., Disp: 30 g, Rfl: 0   ondansetron (ZOFRAN-ODT) 4 MG disintegrating tablet, Take 1 tablet (4 mg total) by mouth every 8 (eight) hours as needed., Disp: 20 tablet, Rfl: 0   pantoprazole (PROTONIX) 40 MG tablet, Take 1 tablet (40 mg total) by mouth daily., Disp: 90 tablet, Rfl: 0   propranolol (INDERAL) 20 MG tablet, Take by mouth., Disp: , Rfl:    VRAYLAR 1.5 MG capsule, Take 1.5 mg by mouth daily., Disp: , Rfl:    Medications ordered in this encounter:  Meds ordered this encounter  Medications   predniSONE (DELTASONE) 20 MG tablet    Sig: Take 2 tablets (40 mg total) by mouth daily with  breakfast.    Dispense:  14 tablet    Refill:  0    Supervising Provider:   Merrilee [name] [[id number]]   albuterol (VENTOLIN HFA) 108 (90 Base) MCG/ACT inhaler    Sig: Inhale 1-2 puffs into the lungs every 6 (six) hours as needed.    Dispense:  8 g    Refill:  0    Supervising Provider:   Merrilee [name] [[id number]]   fluticasone-salmeterol (ADVAIR HFA) 115-21 MCG/ACT inhaler    Sig: Inhale 2 puffs into the lungs 2 (two) times daily.    Dispense:  1 each    Refill:  1    Supervising Provider:   Merrilee [name] [id number]     *If you need refills on other medications prior  to your next appointment, please contact your pharmacy*  Follow-Up: Call back or seek an in-person evaluation if the symptoms worsen or if the condition fails to improve as anticipated.  [facility] [phone]  Other Instructions Asthma, Adult  Asthma is a long-term (chronic) condition that causes recurrent episodes in which the lower airways in the lungs become tight and narrow. The narrowing is caused by inflammation and tightening of the smooth muscle around the lower airways. Asthma episodes, also called asthma attacks or asthma flares, may cause coughing, making high-pitched whistling sounds when you breathe, most often when you breathe out (wheezing), shortness of breath, and chest pain. The airways may produce extra mucus caused by the inflammation and irritation. During an attack, it can be difficult to breathe. Asthma attacks can range from minor to life-threatening. Asthma cannot be cured, but medicines and lifestyle changes can help control it and treat acute attacks. It is important to keep your asthma well controlled so the condition does not interfere with your daily life. What are the causes? This condition is believed to be caused by inherited (genetic) and environmental factors, but its exact cause is not known. What can trigger an asthma attack? Many things can bring on an asthma attack or make symptoms worse. These triggers are different for every person. Common triggers include: Allergens and irritants like mold, dust, pet dander, cockroaches, pollen, air pollution, and chemical odors. Cigarette smoke. Weather changes and cold air. Stress and strong emotional responses such as crying or laughing hard. Certain medications such as aspirin or beta blockers. Infections and inflammatory conditions, such as the flu, a cold, pneumonia, or inflammation of the nasal membranes (rhinitis). Gastroesophageal reflux disease (GERD). What are the signs or  symptoms? Symptoms may occur right after exposure to an asthma trigger or hours later and can vary by person. Common signs and symptoms include: Wheezing. Trouble breathing (shortness of breath). Excessive nighttime or early morning coughing. Chest tightness. Tiredness (fatigue) with minimal activity. Difficulty talking in complete sentences. Poor exercise tolerance. How is this diagnosed? This condition is diagnosed based on: A physical exam and your medical history. Tests, which may include: Lung function studies to evaluate the flow of air in your lungs. Allergy tests. Imaging tests, such as X-rays. How is this treated? There is no cure, but symptoms can be controlled with proper treatment. Treatment usually involves: Identifying and avoiding your asthma triggers. Inhaled medicines. Two types are commonly used to treat asthma, depending on severity: Controller medicines. These help prevent asthma symptoms from occurring. They are taken every day. Fast-acting reliever or rescue medicines. These quickly relieve asthma symptoms. They are used as needed and provide short-term relief. Using other medicines, such as:  Allergy medicines, such as antihistamines, if your asthma attacks are triggered by allergens. Immune medicines (immunomodulators). These are medicines that help control the immune system. Using supplemental oxygen. This is only needed during a severe episode. Creating an asthma action plan. An asthma action plan is a written plan for managing and treating your asthma attacks. This plan includes: A list of your asthma triggers and how to avoid them. Information about when medicines should be taken and when their dosage should be changed. Instructions about using a device called a peak flow meter. A peak flow meter measures how well the lungs are working and the severity of your asthma. It helps you monitor your condition. Follow these instructions at home: Take  over-the-counter and prescription medicines only as told by your health care provider. Stay up to date on all vaccinations as recommended by your healthcare provider, including vaccines for the flu and pneumonia. Use a peak flow meter and keep track of your peak flow readings. Understand and use your asthma action plan to address any asthma flares. Do not smoke or allow anyone to smoke in your home. Contact a health care provider if: You have wheezing, shortness of breath, or a cough that is not responding to medicines. Your medicines are causing side effects, such as a rash, itching, swelling, or trouble breathing. You need to use a reliever medicine more than 2-3 times a week. Your peak flow reading is still at 50-79% of your personal best after following your action plan for 1 hour. You have a fever and shortness of breath. Get help right away if: You are getting worse and do not respond to treatment during an asthma attack. You are short of breath when at rest or when doing very little physical activity. You have difficulty eating, drinking, or talking. You have chest pain or tightness. You develop a fast heartbeat or palpitations. You have a bluish color to your lips or fingernails. You are light-headed or dizzy, or you faint. Your peak flow reading is less than 50% of your personal best. You feel too tired to breathe normally. These symptoms may be an emergency. Get help right away. Call 911. Do not wait to see if the symptoms will go away. Do not drive yourself to the hospital. Summary Asthma is a long-term (chronic) condition that causes recurrent episodes in which the airways become tight and narrow. Asthma episodes, also called asthma attacks or asthma flares, can cause coughing, wheezing, shortness of breath, and chest pain. Asthma cannot be cured, but medicines and lifestyle changes can help keep it well controlled and prevent asthma flares. Make sure you understand how to  avoid triggers and how and when to use your medicines. Asthma attacks can range from minor to life-threatening. Get help right away if you have an asthma attack and do not respond to treatment with your usual rescue medicines. This information is not intended to replace advice given to you by your health care provider. Make sure you discuss any questions you have with your health care provider. Document Revised: 09/18/2021 Document Reviewed: 09/09/2021 Elsevier Patient Education  2024 [employer]   If you have been instructed to have an in-person evaluation today at a local Urgent Care facility, please use the link below. It will take you to a list of all of our available [facility], including address, phone number and hours of operation. Please do not delay care.  [facility]  If you or a family member  do not have a primary care provider, use the link below to schedule a visit and establish care. When you choose a [facility] primary care physician or advanced practice provider, you gain a long-term partner in health. Find a Primary Care Provider  Learn more about [facility] in-office and virtual care options: [facility] - Get Care Now

## 2024-01-17 ENCOUNTER — Telehealth: Payer: Medicare Other | Admitting: Nurse Practitioner

## 2024-01-17 DIAGNOSIS — R112 Nausea with vomiting, unspecified: Secondary | ICD-10-CM

## 2024-01-17 MED ORDER — ONDANSETRON 4 MG PO TBDP: 4.0000 mg | ORAL_TABLET | Freq: Three times a day (TID) | ORAL | 0 refills | Status: DC | PRN

## 2024-01-17 NOTE — Progress Notes (Signed)
 I have spent 5 minutes in review of e-visit questionnaire, review and updating patient chart, medical decision making and response to patient.   Claiborne Rigg, NP

## 2024-01-17 NOTE — Progress Notes (Signed)
E-Visit for Nausea and Vomiting   We are sorry that you are not feeling well. Here is how we plan to help!  Based on what you have shared with me it looks like you have a Virus that is irritating your GI tract.  Vomiting is the forceful emptying of a portion of the stomach's content through the mouth.  Although nausea and vomiting can make you feel miserable, it's important to remember that these are not diseases, but rather symptoms of an underlying illness.  When we treat short term symptoms, we always caution that any symptoms that persist should be fully evaluated in a medical office.  I have prescribed a medication that will help alleviate your symptoms and allow you to stay hydrated:  Zofran 4 mg 1 tablet every 8 hours as needed for nausea and vomiting  HOME CARE: Drink clear liquids.  This is very important! Dehydration (the lack of fluid) can lead to a serious complication.  Start off with 1 tablespoon every 5 minutes for 8 hours. You may begin eating bland foods after 8 hours without vomiting.  Start with saltine crackers, white bread, rice, mashed potatoes, applesauce. After 48 hours on a bland diet, you may resume a normal diet. Try to go to sleep.  Sleep often empties the stomach and relieves the need to vomit.  GET HELP RIGHT AWAY IF:  Your symptoms do not improve or worsen within 2 days after treatment. You have a fever for over 3 days. You cannot keep down fluids after trying the medication.  MAKE SURE YOU:  Understand these instructions. Will watch your condition. Will get help right away if you are not doing well or get worse.    Thank you for choosing an e-visit.  Your e-visit answers were reviewed by a board certified advanced clinical practitioner to complete your personal care plan. Depending upon the condition, your plan could have included both over the counter or prescription medications.  Please review your pharmacy choice. Make sure the pharmacy is open so  you can pick up prescription now. If there is a problem, you may contact your provider through MyChart messaging and have the prescription routed to another pharmacy.  Your safety is important to us. If you have drug allergies check your prescription carefully.   For the next 24 hours you can use MyChart to ask questions about today's visit, request a non-urgent call back, or ask for a work or school excuse. You will get an email in the next two days asking about your experience. I hope that your e-visit has been valuable and will speed your recovery.  

## 2024-01-25 ENCOUNTER — Telehealth: Payer: Medicare Other | Admitting: Physician Assistant

## 2024-01-25 DIAGNOSIS — J069 Acute upper respiratory infection, unspecified: Secondary | ICD-10-CM

## 2024-01-25 MED ORDER — FLUTICASONE PROPIONATE 50 MCG/ACT NA SUSP: 2.0000 | Freq: Every day | NASAL | 0 refills | Status: DC

## 2024-01-25 MED ORDER — BENZONATATE 100 MG PO CAPS: 100.0000 mg | ORAL_CAPSULE | Freq: Three times a day (TID) | ORAL | 0 refills | Status: AC

## 2024-01-25 NOTE — Patient Instructions (Signed)
 [employer], thank you for joining [employer], PA-C for today's virtual visit.  While this provider is not your primary care provider (PCP), if your PCP is located in our provider database this encounter information will be shared with them immediately following your visit.   A [place] MyChart account gives you access to today's visit and all your visits, tests, and labs performed at [facility] " click here if you don't have a [place] MyChart account or go to [email or web address]  Consent: (Patient) [name] provided verbal consent for this virtual visit at the beginning of the encounter.  Current Medications:  Current Outpatient Medications:    benzonatate  (TESSALON ) 100 MG capsule, Take 1 capsule (100 mg total) by mouth every 8 (eight) hours for 5 days., Disp: 15 capsule, Rfl: 0   fluticasone  (FLONASE ) 50 MCG/ACT nasal spray, Place 2 sprays into both nostrils daily., Disp: 16 g, Rfl: 0   acetaminophen  (TYLENOL ) 500 MG tablet, Take 2 tablets (1,000 mg total) by mouth every 8 (eight) hours as needed (pain)., Disp: 60 tablet, Rfl: 0   albuterol  (VENTOLIN  HFA) 108 (90 Base) MCG/ACT inhaler, Inhale 1-2 puffs into the lungs every 6 (six) hours as needed., Disp: 8 g, Rfl: 0   ALPRAZolam  (XANAX ) 1 MG tablet, Take 1 mg by mouth daily as needed., Disp: , Rfl:    ARIPiprazole  (ABILIFY  IM), Inject 300 mg into the muscle every 30 (thirty) days., Disp: , Rfl:    Blood Pressure Monitoring (BLOOD PRESSURE KIT) DEVI, 1 Device by Does not apply route once a week., Disp: 1 each, Rfl: 0   chlorproMAZINE (THORAZINE) 25 MG tablet, Take 25 mg by mouth at bedtime., Disp: , Rfl:    docusate sodium  (COLACE) 100 MG capsule, Take 1 capsule (100 mg total) by mouth 2 (two) times daily., Disp: 30 capsule, Rfl: 0   fluticasone  (FLONASE ) 50 MCG/ACT nasal spray, Place 2 sprays into both nostrils daily., Disp: 16 g, Rfl: 0   fluticasone -salmeterol (ADVAIR HFA) 115-21 MCG/ACT  inhaler, Inhale 2 puffs into the lungs 2 (two) times daily., Disp: 1 each, Rfl: 1   ibuprofen  (ADVIL ) 800 MG tablet, Take 1 tablet (800 mg total) by mouth every 8 (eight) hours as needed., Disp: 30 tablet, Rfl: 0   levocetirizine (XYZAL ) 5 MG tablet, Take 1 tablet (5 mg total) by mouth every evening., Disp: 30 tablet, Rfl: 0   lidocaine  (XYLOCAINE ) 2 % solution, Swallow 5-10 mL every 6 hours as needed for sore throat, Disp: 100 mL, Rfl: 0   montelukast  (SINGULAIR ) 10 MG tablet, Take 1 tablet (10 mg total) by mouth at bedtime., Disp: 14 tablet, Rfl: 0   nystatin  (MYCOSTATIN /NYSTOP ) powder, Apply 1 Application topically 3 (three) times daily., Disp: 60 g, Rfl: 0   nystatin  cream (MYCOSTATIN ), Apply 1 Application topically 2 (two) times daily., Disp: 30 g, Rfl: 1   nystatin -triamcinolone  ointment (MYCOLOG), Apply 1 Application topically 2 (two) times daily., Disp: 30 g, Rfl: 0   ondansetron  (ZOFRAN -ODT) 4 MG disintegrating tablet, Take 1 tablet (4 mg total) by mouth every 8 (eight) hours as needed., Disp: 21 tablet, Rfl: 0   pantoprazole  (PROTONIX ) 40 MG tablet, Take 1 tablet (40 mg total) by mouth daily., Disp: 90 tablet, Rfl: 0   predniSONE  (DELTASONE ) 20 MG tablet, Take 2 tablets (40 mg total) by mouth daily with breakfast., Disp: 14 tablet, Rfl: 0   propranolol (INDERAL) 20 MG tablet, Take by mouth., Disp: , Rfl:    VRAYLAR 1.5  MG capsule, Take 1.5 mg by mouth daily., Disp: , Rfl:    Medications ordered in this encounter:  Meds ordered this encounter  Medications   benzonatate  (TESSALON ) 100 MG capsule    Sig: Take 1 capsule (100 mg total) by mouth every 8 (eight) hours for 5 days.    Dispense:  15 capsule    Refill:  0   fluticasone  (FLONASE ) 50 MCG/ACT nasal spray    Sig: Place 2 sprays into both nostrils daily.    Dispense:  16 g    Refill:  0     *If you need refills on other medications prior to your next appointment, please contact your pharmacy*  Follow-Up: Call back or seek an  in-person evaluation if the symptoms worsen or if the condition fails to improve as anticipated.  [facility] [phone]  Other Instructions Take the tessalon  as directed for cough and use the fluticasone  nasal spray for congestion   If you were given a prescription, please take the prescription as you were instructed and follow the directions given on the discharge paperwork.   Over the next several days you should rest as much as possible, and drink more fluids than usual. Liquids will help thin and loosen mucus so you can cough it up. Liquids will also help prevent dehydration. Using a cool mist humidifier or a vaporizer to increase air moisture in your home can also make it easier for you to breathe and help decrease your cough.  To help soothe a sore throat gargle with warm salt water.  Make salt water by dissolving  teaspoon salt in 1 cup warm water. You may also use throat lozenges and over the counter sore throat spray.  Please follow up with your primary care provider within 5-7 days for re-evaluation of your symptom  Please seek an in person evaluation if your symptoms worsen or fail to improve.  If you have been instructed to have an in-person evaluation today at a local Urgent Care facility, please use the link below. It will take you to a list of all of our available [facility], including address, phone number and hours of operation. Please do not delay care.  [facility]  If you or a family member do not have a primary care provider, use the link below to schedule a visit and establish care. When you choose a [facility] primary care physician or advanced practice provider, you gain a long-term partner in health. Find a Primary Care Provider  Learn more about [facility]'s in-office and virtual care options: [place] - Get Care Now

## 2024-01-25 NOTE — Progress Notes (Signed)
 [name], morr are scheduled for a virtual visit with your provider today.    Just as we do with appointments in the office, we must obtain your consent to participate.  Your consent will be active for this visit and any virtual visit you may have with one of our providers in the next 365 days.    If you have a MyChart account, I can also send a copy of this consent to you electronically.  All virtual visits are billed to your insurance company just like a traditional visit in the office.  As this is a virtual visit, video technology does not allow for your provider to perform a traditional examination.  This may limit your provider's ability to fully assess your condition.  If your provider identifies any concerns that need to be evaluated in person or the need to arrange testing such as labs, EKG, etc, we will make arrangements to do so.    Although advances in technology are sophisticated, we cannot ensure that it will always work on either your end or our end.  If the connection with a video visit is poor, we may have to switch to a telephone visit.  With either a video or telephone visit, we are not always able to ensure that we have a secure connection.   I need to obtain your verbal consent now.   Are you willing to proceed with your visit today?   Lakendria Karlberg Orsino has provided verbal consent on 01/25/2024 for a virtual visit (video or telephone).   Aminta Kales, [name] 01/25/2024  2:31 PM   Date:  01/25/2024   ID:  [name], DOB 1986/07/10, MRN [id number]  Patient Location: Home Provider Location: Home Office   Participants: Patient and Provider for Visit and Wrap up  Method of visit: Video  Location of Patient: Home Location of Provider: Home Office Consent was obtain for visit over the video. Services rendered by provider: Visit was performed via video  A video enabled telemedicine application was used and I verified that I am speaking with the correct person  using two identifiers.  PCP:  Patient, No Pcp Per   Chief Complaint:  flu like illness  History of Present Illness:    [name] is a 38 y.o. female with history as stated below. Presents video telehealth for an acute care visit  Pt reports uri sxs for 4 days. She reports rhinorrhea, cough, sneezing, diarrhea, fever, headache, sore throat with cough. Denies sob or chest main.   Reports multiple sick contacts in the home. States child was diagnosed with viral infection.   Past Medical, Surgical, Social History, Allergies, and Medications have been Reviewed.  Past Medical History:  Diagnosis Date   Abnormal antenatal ultrasound 10/09/2021   Echogenic bowel of infant.    Anxiety    Asthma    inhaler PRN-hasn't used inhaler in months   Attention deficit hyperactivity disorder    Bipolar disorder [facility])    hospitalized as a teen for suicidal ideations and cutting   COVID-19 affecting puerperium 12/19/2021   Depression    no complaints, doing well   Gastritis    GERD (gastroesophageal reflux disease)    History of gestational diabetes in prior pregnancy, currently pregnant 10/13/2017   History of gestational hypertension 07/03/2021   History of pre-eclampsia in prior pregnancy, currently pregnant 07/03/2021   History of preterm delivery after IOL for preeclampsia, currently pregnant 10/13/2017   Not a 17P candidate   Hx  of varicella    Mild preeclampsia 02/09/2016   Nausea and vomiting during pregnancy 10/04/2019   Vaginal Pap smear, abnormal     No outpatient medications have been marked as taking for the 01/25/24 encounter (Appointment) with [facility] PROVIDER.     Allergies:   Depakote [divalproex sodium], Methylphenidate  derivatives, Neurontin [gabapentin], Prozac  [fluoxetine  hcl], and Methylphenidate  hcl   ROS See HPI for history of present illness.  Physical Exam Constitutional:      General: She is not in acute distress.    Appearance: Normal appearance.  She is not ill-appearing.  Pulmonary:     Comments: Speaking in complete sentences             MDM: Pt with uri sxs x4 days. Child with recent viral infection. Likely viral uri. Will treat symptomatically. Rx for cough medication and fluticasone  sent.      Tests Ordered: No orders of the defined types were placed in this encounter.   Medication Changes: No orders of the defined types were placed in this encounter.    Disposition:  Follow up  Signed, Aminta Kales, PA-C  01/25/2024 2:31 PM

## 2024-02-26 ENCOUNTER — Telehealth: Admitting: Family Medicine

## 2024-02-26 NOTE — Progress Notes (Signed)
 Pt did not show up for visit-DWB

## 2024-03-01 ENCOUNTER — Telehealth: Admitting: Physician Assistant

## 2024-03-01 DIAGNOSIS — B372 Candidiasis of skin and nail: Secondary | ICD-10-CM | POA: Diagnosis not present

## 2024-03-01 MED ORDER — NYSTATIN 100000 UNIT/GM EX POWD: 1.0000 | Freq: Three times a day (TID) | CUTANEOUS | 0 refills | Status: DC

## 2024-03-01 MED ORDER — NYSTATIN 100000 UNIT/GM EX CREA: 1.0000 | TOPICAL_CREAM | Freq: Two times a day (BID) | CUTANEOUS | 1 refills | Status: DC

## 2024-03-01 NOTE — Patient Instructions (Signed)
 [name], thank you for joining Nichole Loveless, PA-C for today's virtual visit.  While this provider is not your primary care provider (PCP), if your PCP is located in our provider database this encounter information will be shared with them immediately following your visit.   A [facility] MyChart account gives you access to today's visit and all your visits, tests, and labs performed at [facility] " click here if you don't have a [facility] MyChart account or go to [email or web address]  Consent: (Patient) [name] provided verbal consent for this virtual visit at the beginning of the encounter.  Current Medications:  Current Outpatient Medications:    acetaminophen (TYLENOL) 500 MG tablet, Take 2 tablets (1,000 mg total) by mouth every 8 (eight) hours as needed (pain)., Disp: 60 tablet, Rfl: 0   albuterol (VENTOLIN HFA) 108 (90 Base) MCG/ACT inhaler, Inhale 1-2 puffs into the lungs every 6 (six) hours as needed., Disp: 8 g, Rfl: 0   ALPRAZolam (XANAX) 1 MG tablet, Take 1 mg by mouth daily as needed., Disp: , Rfl:    ARIPiprazole (ABILIFY IM), Inject 300 mg into the muscle every 30 (thirty) days., Disp: , Rfl:    Blood Pressure Monitoring (BLOOD PRESSURE KIT) DEVI, 1 Device by Does not apply route once a week., Disp: 1 each, Rfl: 0   chlorproMAZINE (THORAZINE) 25 MG tablet, Take 25 mg by mouth at bedtime., Disp: , Rfl:    docusate sodium (COLACE) 100 MG capsule, Take 1 capsule (100 mg total) by mouth 2 (two) times daily., Disp: 30 capsule, Rfl: 0   fluticasone (FLONASE) 50 MCG/ACT nasal spray, Place 2 sprays into both nostrils daily., Disp: 16 g, Rfl: 0   fluticasone-salmeterol (ADVAIR HFA) 115-21 MCG/ACT inhaler, Inhale 2 puffs into the lungs 2 (two) times daily., Disp: 1 each, Rfl: 1   ibuprofen (ADVIL) 800 MG tablet, Take 1 tablet (800 mg total) by mouth every 8 (eight) hours as needed., Disp: 30 tablet, Rfl: 0   nystatin (MYCOSTATIN/NYSTOP)  powder, Apply 1 Application topically 3 (three) times daily., Disp: 60 g, Rfl: 0   nystatin cream (MYCOSTATIN), Apply 1 Application topically 2 (two) times daily., Disp: 30 g, Rfl: 1   ondansetron (ZOFRAN-ODT) 4 MG disintegrating tablet, Take 1 tablet (4 mg total) by mouth every 8 (eight) hours as needed., Disp: 21 tablet, Rfl: 0   pantoprazole (PROTONIX) 40 MG tablet, Take 1 tablet (40 mg total) by mouth daily., Disp: 90 tablet, Rfl: 0   propranolol (INDERAL) 20 MG tablet, Take by mouth., Disp: , Rfl:    VRAYLAR 1.5 MG capsule, Take 1.5 mg by mouth daily., Disp: , Rfl:    Medications ordered in this encounter:  Meds ordered this encounter  Medications   nystatin (MYCOSTATIN/NYSTOP) powder    Sig: Apply 1 Application topically 3 (three) times daily.    Dispense:  60 g    Refill:  0    Supervising Provider:   Merrilee Jansky [[id number]]   nystatin cream (MYCOSTATIN)    Sig: Apply 1 Application topically 2 (two) times daily.    Dispense:  30 g    Refill:  1    Supervising Provider:   Merrilee Jansky [id number]     *If you need refills on other medications prior to your next appointment, please contact your pharmacy*  Follow-Up: Call back or seek an in-person evaluation if the symptoms worsen or if the condition fails to improve as anticipated.  [facility] [phone]  Other Instructions Intertrigo Intertrigo is skin irritation or inflammation (dermatitis) that occurs when folds of skin rub together. The irritation can cause a rash and make skin raw and itchy. This condition mostly occurs in the skin folds of these areas: Armpits. Under the breasts. Under the abdomen. Groin. Buttocks. Toes. Intertrigo is not passed from person to person (is not contagious). What are the causes? This condition is caused by heat, moisture, rubbing (friction), and not enough air circulation. The condition can be made worse by: Sweat. Bacteria. A fungus, such as yeast. What  increases the risk? This condition is more likely to occur if you have moisture in your skin folds. You are more likely to develop this condition if you: Are not able to move around or are not active. Live in a warm and moist climate. Are not able to control your bowels or bladder (have incontinence). Wear splints, braces, or other medical devices. Are overweight. Have diabetes. What are the signs or symptoms? Symptoms of this condition include: A pink or red skin rash in a skin fold or near a skin fold. Raw or scaly skin. Itchiness or burning. Bleeding. Leaking fluid. A bad smell. How is this diagnosed? This condition is diagnosed with a medical history and physical exam. You may also have a skin swab to test for bacteria or fungus. How is this treated? This condition may be treated by: Cleaning and drying your skin. Taking an antibiotic medicine or using an antibiotic skin cream for a bacterial infection. Using an antifungal cream on your skin or taking pills for an infection that was caused by a fungus, such as yeast. Using a steroid ointment to relieve itchiness and irritation. Separating the skin fold with a clean cotton cloth to absorb moisture and allow air to flow into the area. Follow these instructions at home: Keep the affected area clean and dry. Do not scratch your skin. Stay in a cool environment as much as possible. Use an air conditioner or fan, if available. Apply over-the-counter and prescription medicines only as told by your health care provider. If you were prescribed antibiotics, use them as told by your health care provider. Do not stop using the antibiotic even if you start to feel better. Keep all follow-up visits. Your health care provider may need to check how well your skin is responding to the treatment. How is this prevented? Shower and dry yourself well after activity or exercise. Use a hair dryer on a cool setting to dry between skin folds, especially  after you bathe. Do not wear tight clothes. Wear clothes that are loose, absorbent, and made of cotton. Wear a bra that gives good support, if needed. Protect the skin around your groin and buttocks, especially if you have incontinence. Skin protection includes: Following a regular cleaning routine. Using skin protectant creams, powders, or ointments. Changing protection pads frequently. Maintain a healthy weight. Take care of your feet, especially if you have diabetes. Foot care includes: Wearing shoes that fit well. Keeping your feet dry. Wearing clean, breathable socks. If you have diabetes, keep your blood sugar under control. Contact a health care provider if: Your symptoms do not improve with treatment. Your symptoms get worse or they spread. You notice increased redness and warmth. You have a fever. This information is not intended to replace advice given to you by your health care provider. Make sure you discuss any questions you have with your health care provider. Document Revised: 04/24/2022 Document Reviewed: 04/24/2022  Elsevier Patient Education  2024 [employer]   If you have been instructed to have an in-person evaluation today at a local Urgent Care facility, please use the link below. It will take you to a list of all of our available [place] [facility], including address, phone number and hours of operation. Please do not delay care.  [facility]  If you or a family member do not have a primary care provider, use the link below to schedule a visit and establish care. When you choose a [facility] primary care physician or advanced practice provider, you gain a long-term partner in health. Find a Primary Care Provider  Learn more about [facility]'s in-office and virtual care options: [place] - Get Care Now

## 2024-03-01 NOTE — Progress Notes (Signed)
 Virtual Visit Consent   [name], you are scheduled for a virtual visit with a [facility] provider today. Just as with appointments in the office, your consent must be obtained to participate. Your consent will be active for this visit and any virtual visit you may have with one of our providers in the next 365 days. If you have a MyChart account, a copy of this consent can be sent to you electronically.  As this is a virtual visit, video technology does not allow for your provider to perform a traditional examination. This may limit your provider's ability to fully assess your condition. If your provider identifies any concerns that need to be evaluated in person or the need to arrange testing (such as labs, EKG, etc.), we will make arrangements to do so. Although advances in technology are sophisticated, we cannot ensure that it will always work on either your end or our end. If the connection with a video visit is poor, the visit may have to be switched to a telephone visit. With either a video or telephone visit, we are not always able to ensure that we have a secure connection.  By engaging in this virtual visit, you consent to the provision of healthcare and authorize for your insurance to be billed (if applicable) for the services provided during this visit. Depending on your insurance coverage, you may receive a charge related to this service.  I need to obtain your verbal consent now. Are you willing to proceed with your visit today? [name] has provided verbal consent on 03/01/2024 for a virtual visit (video or telephone). Nichole Loveless, PA-C  Date: 03/01/2024 6:10 PM   Virtual Visit via Video Note   IMargaretann [name], connected with  [name]  ([id number], 1986-11-19) on 03/01/24 at  6:15 PM EDT by a video-enabled telemedicine application and verified that I am speaking with the correct person using two identifiers.  Location: Patient: Virtual  Visit Location Patient: Home Provider: Virtual Visit Location Provider: Home Office   I discussed the limitations of evaluation and management by telemedicine and the availability of in person appointments. The patient expressed understanding and agreed to proceed.    History of Present Illness: [name] is a 38 y.o. who identifies as a female who was assigned female at birth, and is being seen today for recurrent intertrigo rash.  HPI: Rash This is a recurrent problem. The current episode started in the past 7 days. The problem has been gradually worsening since onset. Location: skin folds, under breast most often. The rash is characterized by burning, redness and itchiness. She was exposed to nothing. Pertinent negatives include no congestion, cough, fatigue, fever, joint pain, nail changes, shortness of breath or sore throat. Treatments tried: In the past Nystatin always works.     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     Methylphenidate Derivatives Other (See Comments)    Reaction:   Depression and anger    Neurontin [Gabapentin] Other (See Comments)    Reaction:  Dizziness    Prozac [Fluoxetine Hcl] Other (See Comments)    Reaction:  Anger    Methylphenidate Hcl     SAME AS PREVIOUS LISTED METHYLPHENIDATE   Medications:  Current Outpatient Medications:    acetaminophen (TYLENOL) 500 MG tablet, Take 2 tablets (1,000 mg total) by mouth every 8 (eight) hours as needed (pain)., Disp: 60 tablet, Rfl: 0   albuterol (VENTOLIN HFA) 108 (90 Base) MCG/ACT inhaler, Inhale 1-2 puffs into the lungs every 6 (six) hours as needed., Disp: 8 g, Rfl: 0   ALPRAZolam (XANAX) 1 MG tablet, Take 1 mg by mouth daily as needed., Disp: , Rfl:    ARIPiprazole (ABILIFY IM), Inject 300 mg into the muscle every 30 (thirty) days., Disp: , Rfl:    Blood Pressure Monitoring (BLOOD PRESSURE KIT) DEVI, 1 Device by Does not apply route once a week., Disp: 1 each, Rfl: 0   chlorproMAZINE (THORAZINE) 25 MG tablet, Take 25 mg by mouth at bedtime., Disp: , Rfl:    docusate sodium (COLACE) 100 MG capsule, Take 1 capsule (100 mg total) by mouth 2 (two) times daily., Disp: 30 capsule, Rfl: 0   fluticasone (FLONASE) 50 MCG/ACT nasal spray, Place 2 sprays into both nostrils daily., Disp: 16 g, Rfl: 0   fluticasone (FLONASE) 50 MCG/ACT nasal spray, Place 2 sprays into both nostrils daily., Disp: 16 g, Rfl: 0   fluticasone-salmeterol (ADVAIR HFA) 115-21 MCG/ACT inhaler, Inhale 2 puffs into the lungs 2 (two) times daily., Disp: 1 each, Rfl: 1   ibuprofen (ADVIL) 800 MG tablet, Take 1 tablet (800 mg total) by mouth every 8 (eight) hours as needed., Disp: 30 tablet, Rfl: 0   levocetirizine (XYZAL) 5 MG tablet, Take 1 tablet (5 mg total) by mouth every evening., Disp: 30 tablet, Rfl: 0   lidocaine (XYLOCAINE) 2 % solution, Swallow 5-10 mL every 6 hours as needed for sore throat, Disp: 100 mL, Rfl: 0   montelukast (SINGULAIR) 10 MG tablet, Take 1 tablet (10 mg total) by mouth at bedtime., Disp: 14 tablet, Rfl: 0    nystatin (MYCOSTATIN/NYSTOP) powder, Apply 1 Application topically 3 (three) times daily., Disp: 60 g, Rfl: 0   nystatin cream (MYCOSTATIN), Apply 1 Application topically 2 (two) times daily., Disp: 30 g, Rfl: 1   nystatin-triamcinolone ointment (MYCOLOG), Apply 1 Application topically 2 (two) times daily., Disp: 30 g, Rfl: 0   ondansetron (ZOFRAN-ODT) 4 MG disintegrating tablet, Take 1 tablet (4 mg total) by mouth every 8 (eight) hours as needed., Disp: 21 tablet, Rfl: 0   pantoprazole (PROTONIX) 40 MG tablet, Take 1 tablet (40 mg total) by mouth daily., Disp: 90 tablet, Rfl: 0   predniSONE (DELTASONE) 20 MG tablet, Take 2 tablets (40 mg total) by mouth daily with breakfast., Disp: 14 tablet, Rfl: 0   propranolol (INDERAL) 20 MG tablet, Take by mouth., Disp: , Rfl:    VRAYLAR 1.5 MG capsule, Take 1.5 mg by mouth daily., Disp: , Rfl:   Observations/Objective: Patient is well-developed, well-nourished in no acute distress.  Resting comfortably at home.  Head is normocephalic,  atraumatic.  No labored breathing.  Speech is clear and coherent with logical content.  Patient is alert and oriented at baseline.    Assessment and Plan: There are no diagnoses linked to this encounter. - Recurrent Intertrigo - Nystatin refilled - Keep skin clean and dry - Follow up in person if symptoms worsen or fail to resolve  Follow Up Instructions: I discussed the assessment and treatment plan with the patient. The patient was provided an opportunity to ask questions and all were answered. The patient agreed with the plan and demonstrated an understanding of the instructions.  A copy of instructions were sent to the patient via MyChart unless otherwise noted below.    The patient was advised to call back or seek an in-person evaluation if the symptoms worsen or if the condition fails to improve as anticipated.    Nichole Loveless, PA-C

## 2024-03-04 ENCOUNTER — Telehealth

## 2024-03-04 ENCOUNTER — Telehealth: Admitting: Family Medicine

## 2024-03-04 DIAGNOSIS — J40 Bronchitis, not specified as acute or chronic: Secondary | ICD-10-CM | POA: Diagnosis not present

## 2024-03-04 DIAGNOSIS — J301 Allergic rhinitis due to pollen: Secondary | ICD-10-CM

## 2024-03-04 MED ORDER — FLUTICASONE PROPIONATE 50 MCG/ACT NA SUSP: 2.0000 | Freq: Every day | NASAL | 0 refills | Status: AC

## 2024-03-04 MED ORDER — PSEUDOEPH-BROMPHEN-DM 30-2-10 MG/5ML PO SYRP
5.0000 mL | ORAL_SOLUTION | Freq: Four times a day (QID) | ORAL | 0 refills | Status: DC | PRN
Start: 2024-03-04 — End: 2024-03-07

## 2024-03-04 MED ORDER — FEXOFENADINE HCL 180 MG PO TABS: 180.0000 mg | ORAL_TABLET | Freq: Every day | ORAL | 0 refills | Status: DC

## 2024-03-04 NOTE — Patient Instructions (Signed)
 Allergic Rhinitis, Adult  Allergic rhinitis is an allergic reaction that affects the mucous membrane inside the nose. The mucous membrane is the tissue that produces mucus. There are two types of allergic rhinitis: Seasonal. This type is also called hay fever and happens only during certain seasons. Perennial. This type can happen at any time of the year. Allergic rhinitis cannot be spread from person to person. This condition can be mild, bad, or very bad. It can develop at any age and may be outgrown. What are the causes? This condition is caused by allergens. These are things that can cause an allergic reaction. Allergens may differ for seasonal allergic rhinitis and perennial allergic rhinitis. Seasonal allergic rhinitis is caused by pollen. Pollen can come from grasses, trees, and weeds. Perennial allergic rhinitis may be caused by: Dust mites. Proteins in a pet's pee (urine), saliva, or dander. Dander is dead skin cells from a pet. Smoke, mold, or car fumes. Remains of or waste from insects such as cockroaches. What increases the risk? You are more likely to develop this condition if you have a family history of allergies or other conditions related to allergies, including: Allergic conjunctivitis. This is irritation and swelling of parts of the eyes and eyelids. Asthma. This condition affects the lungs and makes it hard to breathe. Atopic dermatitis or eczema. This is long term (chronic) irritation and swelling of the skin. Food allergies. What are the signs or symptoms? Symptoms of this condition include: Sneezing or coughing. A stuffy nose (nasal congestion), itchy nose, or nasal discharge. Itchy eyes and tearing of the eyes. A feeling of mucus dripping down the back of your throat (postnasal drip). This may cause a sore throat. Trouble sleeping. Tiredness. Headache. How is this diagnosed? This condition may be diagnosed with your symptoms, your medical history, and a physical  exam. Your health care provider may check for related conditions, such as: Asthma. Pink eye. This is eye swelling and irritation caused by infection (conjunctivitis). Ear infection. Upper respiratory infection. This is an infection in the nose, throat, or upper airways. You may also have tests to find out which allergens cause your symptoms. These may include skin tests or blood tests. How is this treated? There is no cure for this condition, but treatment can help control symptoms. Treatment may include: Taking medicines that block allergy symptoms, such as corticosteroids (anti-inflammatories) and antihistamines. Medicine may be given as a shot, nasal spray, or pill. Avoiding any allergens. Being exposed again and again to tiny amounts of allergens to help you build a defense against allergens (allergenimmunotherapy). This is done if other treatments have not helped. It may include: Allergy shots. These are injected medicines that have small amounts of an allergen in them. Sublingual immunotherapy. This involves taking small doses of a medicine with an allergen in it under your tongue. If these treatments do not work, your provider may prescribe newer, stronger medicines. Follow these instructions at home: Avoiding allergens Find out what you are allergic to and avoid those allergens. These are some things you can do to help avoid allergens: If you have perennial allergies: Replace carpet with wood, tile, or vinyl flooring. Carpet can trap dander and dust. Do not smoke. Do not allow smoking in your home Change your heating and air conditioning filters at least once a month. If you have seasonal allergies, take these steps during allergy season: Keep windows closed as much as possible. Plan outdoor activities when pollen counts are lowest. Check pollen counts before  you plan outdoor activities When coming indoors, change clothing and shower before sitting on furniture or bedding. If you  have a pet in the house that produces allergens: Keep the pet out of the bedroom. Vacuum, sweep, and dust regularly. General instructions Take over-the-counter and prescription medicines only as told by your provider. Drink enough fluid to keep your pee pale yellow. Where to find more information American Academy of Allergy, Asthma & Immunology: aaaai.org Contact a health care provider if: You have a fever. You develop a cough that does not go away. You make high-pitched whistling sounds when you breathe, most often when you breathe out (wheeze). Your symptoms slow you down or stop you from doing your normal activities each day. Get help right away if: You have shortness of breath. This symptom may be an emergency. Get help right away. Call 911. Do not wait to see if the symptoms will go away. Do not drive yourself to the hospital. This information is not intended to replace advice given to you by your health care provider. Make sure you discuss any questions you have with your health care provider. Document Revised: 08/11/2022 Document Reviewed: 08/11/2022 Elsevier Patient Education  2024 [employer].

## 2024-03-04 NOTE — Progress Notes (Signed)
 Virtual Visit Consent   [name], you are scheduled for a virtual visit with a [facility] provider today. Just as with appointments in the office, your consent must be obtained to participate. Your consent will be active for this visit and any virtual visit you may have with one of our providers in the next 365 days. If you have a MyChart account, a copy of this consent can be sent to you electronically.  As this is a virtual visit, video technology does not allow for your provider to perform a traditional examination. This may limit your provider's ability to fully assess your condition. If your provider identifies any concerns that need to be evaluated in person or the need to arrange testing (such as labs, EKG, etc.), we will make arrangements to do so. Although advances in technology are sophisticated, we cannot ensure that it will always work on either your end or our end. If the connection with a video visit is poor, the visit may have to be switched to a telephone visit. With either a video or telephone visit, we are not always able to ensure that we have a secure connection.  By engaging in this virtual visit, you consent to the provision of healthcare and authorize for your insurance to be billed (if applicable) for the services provided during this visit. Depending on your insurance coverage, you may receive a charge related to this service.  I need to obtain your verbal consent now. Are you willing to proceed with your visit today? [name] has provided verbal consent on 03/04/2024 for a virtual visit (video or telephone). Georgana Curio, FNP  Date: 03/04/2024 3:51 PM   Virtual Visit via Video Note   I, Georgana Curio, connected with  [name]  ([id number], [birth year]) on 03/04/24 at  3:45 PM EDT by a video-enabled telemedicine application and verified that I am speaking with the correct person using two identifiers.  Location: Patient: Virtual Visit Location  Patient: Home Provider: Virtual Visit Location Provider: Home Office   I discussed the limitations of evaluation and management by telemedicine and the availability of in person appointments. The patient expressed understanding and agreed to proceed.    History of Present Illness: [name] is a 38 y.o. who identifies as a female who was assigned female at birth, and is being seen today for nasal congestion, sneezing, cough, dry no fever. No wheezing or sob. In no distress. 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     Methylphenidate Derivatives Other (See Comments)    Reaction:  Depression and anger    Neurontin [  Gabapentin] Other (See Comments)    Reaction:  Dizziness    Prozac [Fluoxetine Hcl] Other (See Comments)    Reaction:  Anger    Methylphenidate Hcl     SAME AS PREVIOUS LISTED METHYLPHENIDATE   Medications:  Current Outpatient Medications:    acetaminophen (TYLENOL) 500 MG tablet, Take 2 tablets (1,000 mg total) by mouth every 8 (eight) hours as needed (pain).,  Disp: 60 tablet, Rfl: 0   albuterol (VENTOLIN HFA) 108 (90 Base) MCG/ACT inhaler, Inhale 1-2 puffs into the lungs every 6 (six) hours as needed., Disp: 8 g, Rfl: 0   ALPRAZolam (XANAX) 1 MG tablet, Take 1 mg by mouth daily as needed., Disp: , Rfl:    ARIPiprazole (ABILIFY IM), Inject 300 mg into the muscle every 30 (thirty) days., Disp: , Rfl:    Blood Pressure Monitoring (BLOOD PRESSURE KIT) DEVI, 1 Device by Does not apply route once a week., Disp: 1 each, Rfl: 0   chlorproMAZINE (THORAZINE) 25 MG tablet, Take 25 mg by mouth at bedtime., Disp: , Rfl:    docusate sodium (COLACE) 100 MG capsule, Take 1 capsule (100 mg total) by mouth 2 (two) times daily., Disp: 30 capsule, Rfl: 0   fluticasone (FLONASE) 50 MCG/ACT nasal spray, Place 2 sprays into both nostrils daily., Disp: 16 g, Rfl: 0   fluticasone-salmeterol (ADVAIR HFA) 115-21 MCG/ACT inhaler, Inhale 2 puffs into the lungs 2 (two) times daily., Disp: 1 each, Rfl: 1   ibuprofen (ADVIL) 800 MG tablet, Take 1 tablet (800 mg total) by mouth every 8 (eight) hours as needed., Disp: 30 tablet, Rfl: 0   nystatin (MYCOSTATIN/NYSTOP) powder, Apply 1 Application topically 3 (three) times daily., Disp: 60 g, Rfl: 0   nystatin cream (MYCOSTATIN), Apply 1 Application topically 2 (two) times daily., Disp: 30 g, Rfl: 1   ondansetron (ZOFRAN-ODT) 4 MG disintegrating tablet, Take 1 tablet (4 mg total) by mouth every 8 (eight) hours as needed., Disp: 21 tablet, Rfl: 0   pantoprazole (PROTONIX) 40 MG tablet, Take 1 tablet (40 mg total) by mouth daily., Disp: 90 tablet, Rfl: 0   propranolol (INDERAL) 20 MG tablet, Take by mouth., Disp: , Rfl:    VRAYLAR 1.5 MG capsule, Take 1.5 mg by mouth daily., Disp: , Rfl:   Observations/Objective: Patient is well-developed, well-nourished in no acute distress.  Resting comfortably  at home.  Head is normocephalic, atraumatic.  No labored breathing.  Speech is clear and coherent with logical content.  Patient is alert and  oriented at baseline.  Assessment and Plan: 1. Allergic rhinitis due to pollen, unspecified seasonality (Primary)  2. Bronchitis  Increase fluids, humidifier at night, tylenol or ibuprofen as directed, UC if sx worsen.   Follow Up Instructions: I discussed the assessment and treatment plan with the patient. The patient was provided an opportunity to ask questions and all were answered. The patient agreed with the plan and demonstrated an understanding of the instructions.  A copy of instructions were sent to the patient via MyChart unless otherwise noted below.     The patient was advised to call back or seek an in-person evaluation if the symptoms worsen or if the condition fails to improve as anticipated.    Georgana Curio, FNP

## 2024-03-07 ENCOUNTER — Telehealth: Admitting: Physician Assistant

## 2024-03-07 DIAGNOSIS — R110 Nausea: Secondary | ICD-10-CM

## 2024-03-07 DIAGNOSIS — J069 Acute upper respiratory infection, unspecified: Secondary | ICD-10-CM

## 2024-03-07 MED ORDER — ONDANSETRON 4 MG PO TBDP: 4.0000 mg | ORAL_TABLET | Freq: Three times a day (TID) | ORAL | 0 refills | Status: DC | PRN

## 2024-03-07 MED ORDER — PROMETHAZINE-DM 6.25-15 MG/5ML PO SYRP: 5.0000 mL | ORAL_SOLUTION | Freq: Four times a day (QID) | ORAL | 0 refills | Status: DC | PRN

## 2024-03-07 MED ORDER — PREDNISONE 20 MG PO TABS: 40.0000 mg | ORAL_TABLET | Freq: Every day | ORAL | 0 refills | Status: DC

## 2024-03-07 NOTE — Patient Instructions (Signed)
 [name], thank you for joining [name], PA-C for today's virtual visit.  While this provider is not your primary care provider (PCP), if your PCP is located in our provider database this encounter information will be shared with them immediately following your visit.   A [facility] MyChart account gives you access to today's visit and all your visits, tests, and labs performed at [facility] " click here if you don't have a [facility] MyChart account or go to [email or web address]  Consent: (Patient) [name] provided verbal consent for this virtual visit at the beginning of the encounter.  Current Medications:  Current Outpatient Medications:    ondansetron (ZOFRAN-ODT) 4 MG disintegrating tablet, Take 1 tablet (4 mg total) by mouth every 8 (eight) hours as needed., Disp: 20 tablet, Rfl: 0   predniSONE (DELTASONE) 20 MG tablet, Take 2 tablets (40 mg total) by mouth daily with breakfast., Disp: 10 tablet, Rfl: 0   promethazine-dextromethorphan (PROMETHAZINE-DM) 6.25-15 MG/5ML syrup, Take 5 mLs by mouth 4 (four) times daily as needed., Disp: 118 mL, Rfl: 0   acetaminophen (TYLENOL) 500 MG tablet, Take 2 tablets (1,000 mg total) by mouth every 8 (eight) hours as needed (pain)., Disp: 60 tablet, Rfl: 0   albuterol (VENTOLIN HFA) 108 (90 Base) MCG/ACT inhaler, Inhale 1-2 puffs into the lungs every 6 (six) hours as needed., Disp: 8 g, Rfl: 0   ALPRAZolam (XANAX) 1 MG tablet, Take 1 mg by mouth daily as needed., Disp: , Rfl:    ARIPiprazole (ABILIFY IM), Inject 300 mg into the muscle every 30 (thirty) days., Disp: , Rfl:    Blood Pressure Monitoring (BLOOD PRESSURE KIT) DEVI, 1 Device by Does not apply route once a week., Disp: 1 each, Rfl: 0   chlorproMAZINE (THORAZINE) 25 MG tablet, Take 25 mg by mouth at bedtime., Disp: , Rfl:    docusate sodium (COLACE) 100 MG capsule, Take 1 capsule (100 mg total) by mouth 2 (two) times daily., Disp: 30 capsule,  Rfl: 0   fexofenadine (ALLEGRA ALLERGY) 180 MG tablet, Take 1 tablet (180 mg total) by mouth daily., Disp: 30 tablet, Rfl: 0   fluticasone (FLONASE) 50 MCG/ACT nasal spray, Place 2 sprays into both nostrils daily., Disp: 16 g, Rfl: 0   fluticasone-salmeterol (ADVAIR HFA) 115-21 MCG/ACT inhaler, Inhale 2 puffs into the lungs 2 (two) times daily., Disp: 1 each, Rfl: 1   ibuprofen (ADVIL) 800 MG tablet, Take 1 tablet (800 mg total) by mouth every 8 (eight) hours as needed., Disp: 30 tablet, Rfl: 0   nystatin (MYCOSTATIN/NYSTOP) powder, Apply 1 Application topically 3 (three) times daily., Disp: 60 g, Rfl: 0   nystatin cream (MYCOSTATIN), Apply 1 Application topically 2 (two) times daily., Disp: 30 g, Rfl: 1   pantoprazole (PROTONIX) 40 MG tablet, Take 1 tablet (40 mg total) by mouth daily., Disp: 90 tablet, Rfl: 0   propranolol (INDERAL) 20 MG tablet, Take by mouth., Disp: , Rfl:    VRAYLAR 1.5 MG capsule, Take 1.5 mg by mouth daily., Disp: , Rfl:    Medications ordered in this encounter:  Meds ordered this encounter  Medications   promethazine-dextromethorphan (PROMETHAZINE-DM) 6.25-15 MG/5ML syrup    Sig: Take 5 mLs by mouth 4 (four) times daily as needed.    Dispense:  118 mL    Refill:  0    Supervising Provider:   Merrilee Jansky [[id number]]   predniSONE (DELTASONE) 20 MG tablet    Sig: Take 2 tablets (40  mg total) by mouth daily with breakfast.    Dispense:  10 tablet    Refill:  0    Supervising Provider:   LAMPTEY, PHILIP O [[id number]]   ondansetron (ZOFRAN-ODT) 4 MG disintegrating tablet    Sig: Take 1 tablet (4 mg total) by mouth every 8 (eight) hours as needed.    Dispense:  20 tablet    Refill:  0    Supervising Provider:   Merrilee Jansky [[id number]]     *If you need refills on other medications prior to your next appointment, please contact your pharmacy*  Follow-Up: Call back or seek an in-person evaluation if the symptoms worsen or if the condition fails to improve as  anticipated.  [facility] [phone]  Other Instructions Upper Respiratory Infection, Adult An upper respiratory infection (URI) is a common viral infection of the nose, throat, and upper air passages that lead to the lungs. The most common type of URI is the common cold. URIs usually get better on their own, without medical treatment. What are the causes? A URI is caused by a virus. You may catch a virus by: Breathing in droplets from an infected person's cough or sneeze. Touching something that has been exposed to the virus (is contaminated) and then touching your mouth, nose, or eyes. What increases the risk? You are more likely to get a URI if: You are very young or very old. You have close contact with others, such as at work, school, or a health care facility. You smoke. You have long-term (chronic) heart or lung disease. You have a weakened disease-fighting system (immune system). You have nasal allergies or asthma. You are experiencing a lot of stress. You have poor nutrition. What are the signs or symptoms? A URI usually involves some of the following symptoms: Runny or stuffy (congested) nose. Cough. Sneezing. Sore throat. Headache. Fatigue. Fever. Loss of appetite. Pain in your forehead, behind your eyes, and over your cheekbones (sinus pain). Muscle aches. Redness or irritation of the eyes. Pressure in the ears or face. How is this diagnosed? This condition may be diagnosed based on your medical history and symptoms, and a physical exam. Your health care provider may use a swab to take a mucus sample from your nose (nasal swab). This sample can be tested to determine what virus is causing the illness. How is this treated? URIs usually get better on their own within 7-10 days. Medicines cannot cure URIs, but your health care provider may recommend certain medicines to help relieve symptoms, such as: Over-the-counter cold medicines. Cough  suppressants. Coughing is a type of defense against infection that helps to clear the respiratory system, so take these medicines only as recommended by your health care provider. Fever-reducing medicines. Follow these instructions at home: Activity Rest as needed. If you have a fever, stay home from work or school until your fever is gone or until your health care provider says your URI cannot spread to other people (is no longer contagious). Your health care provider may have you wear a face mask to prevent your infection from spreading. Relieving symptoms Gargle with a mixture of salt and water 3-4 times a day or as needed. To make salt water, completely dissolve -1 tsp (3-6 g) of salt in 1 cup (237 mL) of warm water. Use a cool-mist humidifier to add moisture to the air. This can help you breathe more easily. Eating and drinking  Drink enough fluid to keep your urine pale  yellow. Eat soups and other clear broths. General instructions  Take over-the-counter and prescription medicines only as told by your health care provider. These include cold medicines, fever reducers, and cough suppressants. Do not use any products that contain nicotine or tobacco. These products include cigarettes, chewing tobacco, and vaping devices, such as e-cigarettes. If you need help quitting, ask your health care provider. Stay away from secondhand smoke. Stay up to date on all immunizations, including the yearly (annual) flu vaccine. Keep all follow-up visits. This is important. How to prevent the spread of infection to others URIs can be contagious. To prevent the infection from spreading: Wash your hands with soap and water for at least 20 seconds. If soap and water are not available, use hand sanitizer. Avoid touching your mouth, face, eyes, or nose. Cough or sneeze into a tissue or your sleeve or elbow instead of into your hand or into the air.  Contact a health care provider if: You are getting worse  instead of better. You have a fever or chills. Your mucus is brown or red. You have yellow or brown discharge coming from your nose. You have pain in your face, especially when you bend forward. You have swollen neck glands. You have pain while swallowing. You have white areas in the back of your throat. Get help right away if: You have shortness of breath that gets worse. You have severe or persistent: Headache. Ear pain. Sinus pain. Chest pain. You have chronic lung disease along with any of the following: Making high-pitched whistling sounds when you breathe, most often when you breathe out (wheezing). Prolonged cough (more than 14 days). Coughing up blood. A change in your usual mucus. You have a stiff neck. You have changes in your: Vision. Hearing. Thinking. Mood. These symptoms may be an emergency. Get help right away. Call 911. Do not wait to see if the symptoms will go away. Do not drive yourself to the hospital. Summary An upper respiratory infection (URI) is a common infection of the nose, throat, and upper air passages that lead to the lungs. A URI is caused by a virus. URIs usually get better on their own within 7-10 days. Medicines cannot cure URIs, but your health care provider may recommend certain medicines to help relieve symptoms. This information is not intended to replace advice given to you by your health care provider. Make sure you discuss any questions you have with your health care provider. Document Revised: 07/03/2021 Document Reviewed: 07/03/2021 Elsevier Patient Education  2024 Elsevier [employer].   If you have been instructed to have an in-person evaluation today at a local Urgent Care facility, please use the link below. It will take you to a list of all of our available [facility], including address, phone number and hours of operation. Please do not delay care.  [facility]  If you or a family member do not have a  primary care provider, use the link below to schedule a visit and establish care. When you choose a [facility] primary care physician or advanced practice provider, you gain a long-term partner in health. Find a Primary Care Provider  Learn more about [facility]'s in-office and virtual care options:  - Get Care Now

## 2024-03-07 NOTE — Progress Notes (Signed)
 Virtual Visit Consent   [name], you are scheduled for a virtual visit with a [facility] provider today. Just as with appointments in the office, your consent must be obtained to participate. Your consent will be active for this visit and any virtual visit you may have with one of our providers in the next 365 days. If you have a MyChart account, a copy of this consent can be sent to you electronically.  As this is a virtual visit, video technology does not allow for your provider to perform a traditional examination. This may limit your provider's ability to fully assess your condition. If your provider identifies any concerns that need to be evaluated in person or the need to arrange testing (such as labs, EKG, etc.), we will make arrangements to do so. Although advances in technology are sophisticated, we cannot ensure that it will always work on either your end or our end. If the connection with a video visit is poor, the visit may have to be switched to a telephone visit. With either a video or telephone visit, we are not always able to ensure that we have a secure connection.  By engaging in this virtual visit, you consent to the provision of healthcare and authorize for your insurance to be billed (if applicable) for the services provided during this visit. Depending on your insurance coverage, you may receive a charge related to this service.  I need to obtain your verbal consent now. Are you willing to proceed with your visit today? [name] has provided verbal consent on 03/07/2024 for a virtual visit (video or telephone). [name], PA-C  Date: 03/07/2024 10:28 AM   Virtual Visit via Video Note   I, [name], connected with  [name]  ([id number], 1986) on 03/07/24 at 10:15 AM EDT by a video-enabled telemedicine application and verified that I am speaking with the correct person using two identifiers.  Location: Patient: Virtual  Visit Location Patient: Home Provider: Virtual Visit Location Provider: Home Office   I discussed the limitations of evaluation and management by telemedicine and the availability of in person appointments. The patient expressed understanding and agreed to proceed.    History of Present Illness: [name] is a 38 y.o. who identifies as a female who was assigned female at birth, and is being seen today for cough.  HPI: Cough This is a new problem. The current episode started in the past 7 days (Seen Friday, 03/04/24, and given Bromfed DM). The problem has been gradually worsening (progressing and now cough inducing vomiting). The problem occurs every few minutes. The cough is Productive of sputum and productive of purulent sputum. Associated symptoms include a fever (unsure), nasal congestion, postnasal drip, rhinorrhea and a sore throat (sore and congested). Pertinent negatives include no chills, ear congestion, ear pain, headaches, myalgias or wheezing. The symptoms are aggravated by lying down and cold air. Treatments tried: mucinex; did not pick up bromfed due to not being covered. The treatment provided no relief. Her past medical history is significant for asthma, bronchitis and environmental allergies.     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     Methylphenidate Derivatives Other (See Comments)    Reaction:  Depression and anger    Neurontin [Gabapentin] Other (See Comments)    Reaction:  Dizziness    Prozac [Fluoxetine Hcl] Other (See Comments)    Reaction:  Anger    Methylphenidate Hcl     SAME AS PREVIOUS LISTED METHYLPHENIDATE   Medications:  Current Outpatient Medications:    ondansetron (ZOFRAN-ODT) 4 MG disintegrating tablet, Take 1 tablet (4 mg total) by mouth every 8 (eight) hours as needed., Disp: 20 tablet, Rfl: 0   predniSONE (DELTASONE) 20 MG tablet, Take 2 tablets (40 mg total) by mouth daily with breakfast., Disp: 10 tablet, Rfl: 0   promethazine-dextromethorphan (PROMETHAZINE-DM) 6.25-15 MG/5ML syrup, Take 5 mLs by mouth 4 (four) times daily as needed., Disp: 118 mL, Rfl: 0   acetaminophen (TYLENOL) 500 MG tablet, Take 2 tablets (1,000 mg total) by mouth every 8 (eight) hours as needed (pain)., Disp: 60 tablet, Rfl: 0   albuterol (VENTOLIN HFA) 108 (90 Base) MCG/ACT inhaler, Inhale 1-2 puffs into the lungs every 6 (six) hours as needed., Disp: 8 g, Rfl: 0   ALPRAZolam (XANAX) 1 MG tablet, Take 1 mg by mouth daily as needed., Disp: , Rfl:    ARIPiprazole (ABILIFY IM), Inject 300 mg into the muscle every 30 (thirty) days., Disp: , Rfl:    Blood Pressure Monitoring (BLOOD PRESSURE KIT) DEVI, 1 Device by Does not apply route once a week., Disp: 1 each, Rfl: 0   chlorproMAZINE (THORAZINE) 25 MG tablet, Take 25 mg by mouth at bedtime., Disp: , Rfl:    docusate sodium (COLACE) 100 MG capsule, Take 1 capsule (100 mg total) by mouth 2 (two) times daily., Disp: 30 capsule, Rfl: 0   fexofenadine (ALLEGRA ALLERGY) 180 MG tablet, Take 1 tablet (180 mg total) by mouth  daily., Disp: 30 tablet, Rfl: 0   fluticasone (FLONASE) 50 MCG/ACT nasal spray, Place 2 sprays into both nostrils daily., Disp: 16 g, Rfl: 0   fluticasone-salmeterol (ADVAIR HFA) 115-21 MCG/ACT inhaler, Inhale 2 puffs into the lungs 2 (two) times daily., Disp: 1 each, Rfl: 1   ibuprofen (ADVIL) 800 MG tablet, Take 1 tablet (800 mg total) by mouth every 8 (eight) hours as needed., Disp: 30 tablet, Rfl: 0   nystatin (MYCOSTATIN/NYSTOP) powder, Apply 1 Application topically 3 (three) times daily., Disp: 60 g, Rfl: 0   nystatin cream (MYCOSTATIN), Apply 1 Application topically 2 (two) times daily., Disp: 30 g, Rfl: 1   pantoprazole (PROTONIX) 40 MG tablet, Take 1 tablet (40 mg total) by mouth daily., Disp: 90 tablet, Rfl: 0   propranolol (INDERAL) 20 MG tablet, Take by mouth., Disp: , Rfl:    VRAYLAR 1.5 MG capsule, Take 1.5 mg by mouth daily., Disp: , Rfl:   Observations/Objective: Patient is well-developed, well-nourished in no acute distress.  Resting comfortably at home.  Head is normocephalic, atraumatic.  No labored breathing.  Speech is clear and coherent  with logical content.  Patient is alert and oriented at baseline.    Assessment and Plan: 1. Viral URI with cough (Primary) - promethazine-dextromethorphan (PROMETHAZINE-DM) 6.25-15 MG/5ML syrup; Take 5 mLs by mouth 4 (four) times daily as needed.  Dispense: 118 mL; Refill: 0 - predniSONE (DELTASONE) 20 MG tablet; Take 2 tablets (40 mg total) by mouth daily with breakfast.  Dispense: 10 tablet; Refill: 0  2. Nausea - ondansetron (ZOFRAN-ODT) 4 MG disintegrating tablet; Take 1 tablet (4 mg total) by mouth every 8 (eight) hours as needed.  Dispense: 20 tablet; Refill: 0  - Suspect viral URI - Symptomatic medications of choice over the counter as needed - Promethazine DM for cough, covered better - Prednisone for bronchitis - Continue Albuterol as needed - Zofran for nausea and vomiting - Push fluids - Rest - Seek further  evaluation if symptoms change or worsen   Follow Up Instructions: I discussed the assessment and treatment plan with the patient. The patient was provided an opportunity to ask questions and all were answered. The patient agreed with the plan and demonstrated an understanding of the instructions.  A copy of instructions were sent to the patient via MyChart unless otherwise noted below.    The patient was advised to call back or seek an in-person evaluation if the symptoms worsen or if the condition fails to improve as anticipated.    [name], PA-C

## 2024-04-27 ENCOUNTER — Telehealth

## 2024-04-27 ENCOUNTER — Telehealth: Admitting: Physician Assistant

## 2024-04-27 DIAGNOSIS — J069 Acute upper respiratory infection, unspecified: Secondary | ICD-10-CM

## 2024-04-27 MED ORDER — CLARITIN-D 24 HOUR 10-240 MG PO TB24: 1.0000 | ORAL_TABLET | Freq: Every day | ORAL | 0 refills | Status: AC

## 2024-04-27 MED ORDER — ONDANSETRON 4 MG PO TBDP: 4.0000 mg | ORAL_TABLET | Freq: Three times a day (TID) | ORAL | 0 refills | Status: DC | PRN

## 2024-04-27 NOTE — Progress Notes (Signed)
 Virtual Visit Consent   [name], you are scheduled for a virtual visit with a [facility] provider today. Just as with appointments in the office, your consent must be obtained to participate. Your consent will be active for this visit and any virtual visit you may have with one of our providers in the next 365 days. If you have a MyChart account, a copy of this consent can be sent to you electronically.  As this is a virtual visit, video technology does not allow for your provider to perform a traditional examination. This may limit your provider's ability to fully assess your condition. If your provider identifies any concerns that need to be evaluated in person or the need to arrange testing (such as labs, EKG, etc.), we will make arrangements to do so. Although advances in technology are sophisticated, we cannot ensure that it will always work on either your end or our end. If the connection with a video visit is poor, the visit may have to be switched to a telephone visit. With either a video or telephone visit, we are not always able to ensure that we have a secure connection.  By engaging in this virtual visit, you consent to the provision of healthcare and authorize for your insurance to be billed (if applicable) for the services provided during this visit. Depending on your insurance coverage, you may receive a charge related to this service.  I need to obtain your verbal consent now. Are you willing to proceed with your visit today? [name] Code has provided verbal consent on 04/27/2024 for a virtual visit (video or telephone). [name], [name]  Date: 04/27/2024 10:38 AM   Virtual Visit via Video Note   I, [name], connected with  [name]  ([id number], [birth year]) on 04/27/24 at 10:30 AM EDT by a video-enabled telemedicine application and verified that I am speaking with the correct person using two identifiers.  Location: Patient: Virtual  Visit Location Patient: Home Provider: Virtual Visit Location Provider: Home Office   I discussed the limitations of evaluation and management by telemedicine and the availability of in person appointments. The patient expressed understanding and agreed to proceed.    History of Present Illness: [name] is a 38 y.o. who identifies as a female who was assigned female at birth, and is being seen today for URI symptoms starting in the past 48 hours. Endorses initially with nasal congestion, sinus congestion, sore throat. Denies fever, chills, aches. Denies substantial chest congestion but notes cough. Denies sinus pain. Due to drainage she was noting nausea with episode of emesis this morning. Non-bloody.   OTC -- Ibuprofen /Tylenol , Chloraseptic spray.    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     Methylphenidate  Derivatives Other (See Comments)    Reaction:  Depression and anger    Neurontin [Gabapentin] Other (See Comments)    Reaction:  Dizziness    Prozac  [Fluoxetine  Hcl]  Other (See Comments)    Reaction:  Anger    Methylphenidate  Hcl     SAME AS PREVIOUS LISTED METHYLPHENIDATE    Medications:  Current Outpatient Medications:    loratadine-pseudoephedrine (CLARITIN-D 24 HOUR) 10-240 MG 24 hr tablet, Take 1 tablet by mouth daily., Disp: 30 tablet, Rfl: 0   ondansetron  (ZOFRAN -ODT) 4 MG disintegrating tablet, Take 1 tablet (4 mg total) by mouth every 8 (eight) hours as needed for nausea or vomiting., Disp: 20 tablet, Rfl: 0   acetaminophen  (TYLENOL ) 500 MG tablet, Take 2 tablets (1,000 mg total) by mouth every 8 (eight) hours as needed (pain)., Disp: 60 tablet, Rfl: 0   albuterol  (VENTOLIN  HFA) 108 (90 Base) MCG/ACT inhaler, Inhale 1-2 puffs into the lungs every 6 (six) hours as needed., Disp: 8 g, Rfl: 0   ALPRAZolam  (XANAX ) 1 MG tablet, Take 1 mg by mouth daily as needed., Disp: , Rfl:    ARIPiprazole  (ABILIFY  IM), Inject 300 mg into the muscle every 30 (thirty) days., Disp: , Rfl:    Blood Pressure Monitoring (BLOOD PRESSURE KIT) DEVI, 1 Device by Does not apply route once a week., Disp: 1 each, Rfl: 0   chlorproMAZINE (THORAZINE) 25 MG tablet, Take 25 mg by mouth at bedtime., Disp: , Rfl:    docusate sodium  (COLACE) 100 MG capsule, Take 1 capsule (100 mg total) by mouth 2 (two) times daily., Disp: 30 capsule, Rfl: 0   fluticasone  (FLONASE ) 50 MCG/ACT nasal spray, Place 2 sprays into both nostrils daily., Disp: 16 g, Rfl: 0   fluticasone -salmeterol (ADVAIR HFA) 115-21 MCG/ACT inhaler, Inhale 2 puffs into the lungs 2 (two) times daily., Disp: 1 each, Rfl: 1   ibuprofen  (ADVIL ) 800 MG tablet, Take 1 tablet (800 mg total) by mouth every 8 (eight) hours as needed., Disp: 30 tablet, Rfl: 0   nystatin  (MYCOSTATIN /NYSTOP ) powder, Apply 1 Application topically 3 (three) times daily., Disp: 60 g, Rfl: 0   nystatin  cream (MYCOSTATIN ), Apply 1 Application topically 2 (two) times daily., Disp: 30 g, Rfl: 1   pantoprazole  (PROTONIX ) 40 MG tablet, Take 1 tablet (40 mg total)  by mouth daily., Disp: 90 tablet, Rfl: 0   propranolol (INDERAL) 20 MG tablet, Take by mouth., Disp: , Rfl:    VRAYLAR 1.5 MG capsule, Take 1.5 mg by mouth daily., Disp: , Rfl:   Observations/Objective: Patient is well-developed, well-nourished in no acute distress.  Resting comfortably at home.  Head is normocephalic, atraumatic.  No labored breathing.  Speech is clear and coherent with logical content.  Patient is alert and oriented at baseline.   Assessment and Plan: 1. Viral URI with cough (Primary) - ondansetron  (ZOFRAN -ODT) 4 MG disintegrating tablet; Take 1 tablet (4 mg total) by mouth every 8 (eight) hours as needed for nausea or vomiting.  Dispense: 20 tablet; Refill: 0 - loratadine-pseudoephedrine (CLARITIN-D 24 HOUR) 10-240 MG 24 hr tablet; Take 1 tablet by mouth daily.  Dispense: 30 tablet; Refill: 0  2 days of symptoms. Afebrile. Supportive measures and OTC medications reviewed. Will give refill of Zofran  in case on more nausea from PND. Claritin-D per orders. Follow-up in person for any non-resolving, new or worsening symptoms.  Follow Up Instructions: I discussed the assessment and treatment plan with the patient. The patient was provided an opportunity to ask questions and all were answered. The patient agreed with the plan and demonstrated an understanding of the instructions.  A copy of instructions were sent to the patient via MyChart unless otherwise noted below.   The patient was advised to call back or seek an in-person evaluation if the symptoms worsen or if the condition fails to improve as anticipated.    Nichole Maillard, PA-C

## 2024-04-27 NOTE — Patient Instructions (Signed)
 [name], thank you for joining [name], PA-C for today's virtual visit.  While this provider is not your primary care provider (PCP), if your PCP is located in our provider database this encounter information will be shared with them immediately following your visit.   A [facility] MyChart account gives you access to today's visit and all your visits, tests, and labs performed at [facility] " click here if you don't have a [facility] MyChart account or go to [email or web address]  Consent: (Patient) [name] provided verbal consent for this virtual visit at the beginning of the encounter.  Current Medications:  Current Outpatient Medications:    acetaminophen  (TYLENOL ) 500 MG tablet, Take 2 tablets (1,000 mg total) by mouth every 8 (eight) hours as needed (pain)., Disp: 60 tablet, Rfl: 0   albuterol  (VENTOLIN  HFA) 108 (90 Base) MCG/ACT inhaler, Inhale 1-2 puffs into the lungs every 6 (six) hours as needed., Disp: 8 g, Rfl: 0   ALPRAZolam  (XANAX ) 1 MG tablet, Take 1 mg by mouth daily as needed., Disp: , Rfl:    ARIPiprazole  (ABILIFY  IM), Inject 300 mg into the muscle every 30 (thirty) days., Disp: , Rfl:    Blood Pressure Monitoring (BLOOD PRESSURE KIT) DEVI, 1 Device by Does not apply route once a week., Disp: 1 each, Rfl: 0   chlorproMAZINE (THORAZINE) 25 MG tablet, Take 25 mg by mouth at bedtime., Disp: , Rfl:    docusate sodium  (COLACE) 100 MG capsule, Take 1 capsule (100 mg total) by mouth 2 (two) times daily., Disp: 30 capsule, Rfl: 0   fexofenadine  (ALLEGRA  ALLERGY) 180 MG tablet, Take 1 tablet (180 mg total) by mouth daily., Disp: 30 tablet, Rfl: 0   fluticasone  (FLONASE ) 50 MCG/ACT nasal spray, Place 2 sprays into both nostrils daily., Disp: 16 g, Rfl: 0   fluticasone -salmeterol (ADVAIR HFA) 115-21 MCG/ACT inhaler, Inhale 2 puffs into the lungs 2 (two) times daily., Disp: 1 each, Rfl: 1   ibuprofen  (ADVIL ) 800 MG tablet, Take 1 tablet  (800 mg total) by mouth every 8 (eight) hours as needed., Disp: 30 tablet, Rfl: 0   nystatin  (MYCOSTATIN /NYSTOP ) powder, Apply 1 Application topically 3 (three) times daily., Disp: 60 g, Rfl: 0   nystatin  cream (MYCOSTATIN ), Apply 1 Application topically 2 (two) times daily., Disp: 30 g, Rfl: 1   ondansetron  (ZOFRAN -ODT) 4 MG disintegrating tablet, Take 1 tablet (4 mg total) by mouth every 8 (eight) hours as needed., Disp: 20 tablet, Rfl: 0   pantoprazole  (PROTONIX ) 40 MG tablet, Take 1 tablet (40 mg total) by mouth daily., Disp: 90 tablet, Rfl: 0   predniSONE  (DELTASONE ) 20 MG tablet, Take 2 tablets (40 mg total) by mouth daily with breakfast., Disp: 10 tablet, Rfl: 0   promethazine -dextromethorphan  (PROMETHAZINE -DM) 6.25-15 MG/5ML syrup, Take 5 mLs by mouth 4 (four) times daily as needed., Disp: 118 mL, Rfl: 0   propranolol (INDERAL) 20 MG tablet, Take by mouth., Disp: , Rfl:    VRAYLAR 1.5 MG capsule, Take 1.5 mg by mouth daily., Disp: , Rfl:    Medications ordered in this encounter:  No orders of the defined types were placed in this encounter.    *If you need refills on other medications prior to your next appointment, please contact your pharmacy*  Follow-Up: Call back or seek an in-person evaluation if the symptoms worsen or if the condition fails to improve as anticipated.  [facility] [phone]  Other Instructions Upper Respiratory Infection, Adult An  upper respiratory infection (URI) affects the nose, throat, and upper airways that lead to the lungs. The most common type of URI is often called the common cold. URIs usually get better on their own, without medical treatment. What are the causes? A URI is caused by a germ (virus). You may catch these germs by: Breathing in droplets from an infected person's cough or sneeze. Touching something that has the germ on it (is contaminated) and then touching your mouth, nose, or eyes. What increases the risk? You are  more likely to get a URI if: You are very young or very old. You have close contact with others, such as at work, school, or a health care facility. You smoke. You have long-term (chronic) heart or lung disease. You have a weakened disease-fighting system (immune system). You have nasal allergies or asthma. You have a lot of stress. You have poor nutrition. What are the signs or symptoms? Runny or stuffy (congested) nose. Cough. Sneezing. Sore throat. Headache. Feeling tired (fatigue). Fever. Not wanting to eat as much as usual. Pain in your forehead, behind your eyes, and over your cheekbones (sinus pain). Muscle aches. Redness or irritation of the eyes. Pressure in the ears or face. How is this treated? URIs usually get better on their own within 7-10 days. Medicines cannot cure URIs, but your doctor may recommend certain medicines to help relieve symptoms, such as: Over-the-counter cold medicines. Medicines to reduce coughing (cough suppressants). Coughing is a type of defense against infection that helps to clear the nose, throat, windpipe, and lungs (respiratory system). Take these medicines only as told by your doctor. Medicines to lower your fever. Follow these instructions at home: Activity Rest as needed. If you have a fever, stay home from work or school until your fever is gone, or until your doctor says you may return to work or school. You should stay home until you cannot spread the infection anymore (you are not contagious). Your doctor may have you wear a face mask so you have less risk of spreading the infection. Relieving symptoms Rinse your mouth often with salt water. To make salt water, dissolve -1 tsp (3-6 g) of salt in 1 cup (237 mL) of warm water. Use a cool-mist humidifier to add moisture to the air. This can help you breathe more easily. Eating and drinking  Drink enough fluid to keep your pee (urine) pale yellow. Eat soups and other clear  broths. General instructions  Take over-the-counter and prescription medicines only as told by your doctor. Do not smoke or use any products that contain nicotine  or tobacco. If you need help quitting, ask your doctor. Avoid being where people are smoking (avoid secondhand smoke). Stay up to date on all your shots (immunizations), and get the flu shot every year. Keep all follow-up visits. How to prevent the spread of infection to others  Wash your hands with soap and water for at least 20 seconds. If you cannot use soap and water, use hand sanitizer. Avoid touching your mouth, face, eyes, or nose. Cough or sneeze into a tissue or your sleeve or elbow. Do not cough or sneeze into your hand or into the air. Contact a doctor if: You are getting worse, not better. You have any of these: A fever or chills. Brown or red mucus in your nose. Yellow or brown fluid (discharge)coming from your nose. Pain in your face, especially when you bend forward. Swollen neck glands. Pain when you swallow. White areas in the  back of your throat. Get help right away if: You have shortness of breath that gets worse. You have very bad or constant: Headache. Ear pain. Pain in your forehead, behind your eyes, and over your cheekbones (sinus pain). Chest pain. You have long-lasting (chronic) lung disease along with any of these: Making high-pitched whistling sounds when you breathe, most often when you breathe out (wheezing). Long-lasting cough (more than 14 days). Coughing up blood. A change in your usual mucus. You have a stiff neck. You have changes in your: Vision. Hearing. Thinking. Mood. These symptoms may be an emergency. Get help right away. Call 911. Do not wait to see if the symptoms will go away. Do not drive yourself to the hospital. Summary An upper respiratory infection (URI) is caused by a germ (virus). The most common type of URI is often called the common cold. URIs usually get  better within 7-10 days. Take over-the-counter and prescription medicines only as told by your doctor. This information is not intended to replace advice given to you by your health care provider. Make sure you discuss any questions you have with your health care provider. Document Revised: 07/03/2021 Document Reviewed: 07/03/2021 Elsevier Patient Education  2024 Elsevier Inc.   If you have been instructed to have an in-person evaluation today at a local Urgent Care facility, please use the link below. It will take you to a list of all of our available [facility], including address, phone number and hours of operation. Please do not delay care.  [facility]  If you or a family member do not have a primary care provider, use the link below to schedule a visit and establish care. When you choose a [facility] primary care physician or advanced practice provider, you gain a long-term partner in health. Find a Primary Care Provider  Learn more about [facility]'s in-office and virtual care options: [facility] - Get Care Now

## 2024-04-28 ENCOUNTER — Telehealth: Admitting: Physician Assistant

## 2024-04-28 DIAGNOSIS — J4521 Mild intermittent asthma with (acute) exacerbation: Secondary | ICD-10-CM

## 2024-04-28 DIAGNOSIS — N946 Dysmenorrhea, unspecified: Secondary | ICD-10-CM | POA: Diagnosis not present

## 2024-04-28 MED ORDER — PREDNISONE 10 MG (21) PO TBPK: ORAL_TABLET | ORAL | 0 refills | Status: DC

## 2024-04-28 MED ORDER — IBUPROFEN 800 MG PO TABS
800.0000 mg | ORAL_TABLET | Freq: Three times a day (TID) | ORAL | 0 refills | Status: AC | PRN
Start: 2024-04-28 — End: ?

## 2024-04-28 NOTE — Progress Notes (Signed)
 Virtual Visit Consent   [name], you are scheduled for a virtual visit with a [facility] provider today. Just as with appointments in the office, your consent must be obtained to participate. Your consent will be active for this visit and any virtual visit you may have with one of our providers in the next 365 days. If you have a MyChart account, a copy of this consent can be sent to you electronically.  As this is a virtual visit, video technology does not allow for your provider to perform a traditional examination. This may limit your provider's ability to fully assess your condition. If your provider identifies any concerns that need to be evaluated in person or the need to arrange testing (such as labs, EKG, etc.), we will make arrangements to do so. Although advances in technology are sophisticated, we cannot ensure that it will always work on either your end or our end. If the connection with a video visit is poor, the visit may have to be switched to a telephone visit. With either a video or telephone visit, we are not always able to ensure that we have a secure connection.  By engaging in this virtual visit, you consent to the provision of healthcare and authorize for your insurance to be billed (if applicable) for the services provided during this visit. Depending on your insurance coverage, you may receive a charge related to this service.  I need to obtain your verbal consent now. Are you willing to proceed with your visit today? [name] has provided verbal consent on 04/28/2024 for a virtual visit (video or telephone). [name], [name]  Date: 04/28/2024 5:00 PM   Virtual Visit via Video Note   I, [name], connected with  [name]  ([id number], 09/10/1986) on 04/28/24 at  4:30 PM EDT by a video-enabled telemedicine application and verified that I am speaking with the correct person using two identifiers.  Location: Patient: Virtual  Visit Location Patient: Home Provider: Virtual Visit Location Provider: Home Office   I discussed the limitations of evaluation and management by telemedicine and the availability of in person appointments. The patient expressed understanding and agreed to proceed.    History of Present Illness: [name] is a 38 y.o. who identifies as a female who was assigned female at birth, and is being seen today for some new symptoms since evaluation yesterday for viral illness. Notes some increased fatigue with chest tightness and wheezing. Mild SOB with exertion. Denies chest pain, lightheadedness or dizziness. Remains afebrile. Still with nasal congestion but again denies sinus pain or facial pain.   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     Methylphenidate  Derivatives Other (See Comments)    Reaction:  Depression and anger    Neurontin [Gabapentin] Other (See Comments)    Reaction:  Dizziness    Prozac  [Fluoxetine  Hcl] Other (See Comments)    Reaction:  Anger    Methylphenidate  Hcl      SAME AS PREVIOUS LISTED METHYLPHENIDATE    Medications:  Current Outpatient Medications:    predniSONE  (STERAPRED UNI-PAK 21 TAB) 10 MG (21) TBPK tablet, Take following package directions, Disp: 21 tablet, Rfl: 0   acetaminophen  (TYLENOL ) 500 MG tablet, Take 2 tablets (1,000 mg total) by mouth every 8 (eight) hours as needed (pain)., Disp: 60 tablet, Rfl: 0   albuterol  (VENTOLIN  HFA) 108 (90 Base) MCG/ACT inhaler, Inhale 1-2 puffs into the lungs every 6 (six) hours as needed., Disp: 8 g, Rfl: 0   ALPRAZolam  (XANAX ) 1 MG tablet, Take 1 mg by mouth daily as needed., Disp: , Rfl:    ARIPiprazole  (ABILIFY  IM), Inject 300 mg into the muscle every 30 (thirty) days., Disp: , Rfl:    Blood Pressure Monitoring (BLOOD PRESSURE KIT) DEVI, 1 Device by Does not apply route once a week., Disp: 1 each, Rfl: 0   chlorproMAZINE (THORAZINE) 25 MG tablet, Take 25 mg by mouth at bedtime., Disp: , Rfl:    docusate sodium  (COLACE) 100 MG capsule, Take 1 capsule (100 mg total) by mouth 2 (two) times daily., Disp: 30 capsule, Rfl: 0   fluticasone  (FLONASE ) 50 MCG/ACT nasal spray, Place 2 sprays into both nostrils daily., Disp: 16 g, Rfl: 0   fluticasone -salmeterol (ADVAIR HFA) 115-21 MCG/ACT inhaler, Inhale 2 puffs into the lungs 2 (two) times daily., Disp: 1 each, Rfl: 1   ibuprofen  (ADVIL ) 800 MG tablet, Take 1 tablet (800 mg total) by mouth every 8 (eight) hours as needed., Disp: 30 tablet, Rfl: 0   loratadine-pseudoephedrine (CLARITIN-D 24 HOUR) 10-240 MG 24 hr tablet, Take 1 tablet by mouth daily., Disp: 30 tablet, Rfl: 0   nystatin  (MYCOSTATIN /NYSTOP ) powder, Apply 1 Application topically 3 (three) times daily., Disp: 60 g, Rfl: 0   nystatin  cream (MYCOSTATIN ), Apply 1 Application topically 2 (two) times daily., Disp: 30 g, Rfl: 1   ondansetron  (ZOFRAN -ODT) 4 MG disintegrating tablet, Take 1 tablet (4 mg total) by mouth every 8 (eight) hours as needed for nausea or vomiting., Disp: 20 tablet, Rfl: 0   pantoprazole   (PROTONIX ) 40 MG tablet, Take 1 tablet (40 mg total) by mouth daily., Disp: 90 tablet, Rfl: 0   propranolol (INDERAL) 20 MG tablet, Take by mouth., Disp: , Rfl:    VRAYLAR 1.5 MG capsule, Take 1.5 mg by mouth daily., Disp: , Rfl:   Observations/Objective: Patient is well-developed, well-nourished in no acute distress.  Resting comfortably at home.  Head is normocephalic, atraumatic.  No labored breathing.  Speech is clear and coherent with logical content.  Patient is alert and oriented at baseline.    Assessment and Plan: 1. Mild intermittent asthma with exacerbation (Primary) - predniSONE  (STERAPRED UNI-PAK 21 TAB) 10 MG (21) TBPK tablet; Take following package directions  Dispense: 21 tablet; Refill: 0  Secondary from viral URI. Continue supportive measures and Rx given at time of yesterday's visit. Restart use of albuterol  as directed, when needed. Strapred pack per orders. Will need in-person evaluation for any non-resolving, new or worsening symptoms despite treatment.   2. Dysmenorrhea  Requesting a refill of her Ibuprofen  800 mg to use as  needed with her menstrual periods. Refill sent. She knows not to take this while on the steroid medication for her asthma exacerbation.  Follow Up Instructions: I discussed the assessment and treatment plan with the patient. The patient was provided an opportunity to ask questions and all were answered. The patient agreed with the plan and demonstrated an understanding of the instructions.  A copy of instructions were sent to the patient via MyChart unless otherwise noted below.    The patient was advised to call back or seek an in-person evaluation if the symptoms worsen or if the condition fails to improve as anticipated.    Nichole Maillard, PA-C

## 2024-04-28 NOTE — Patient Instructions (Signed)
 Leron Rankins, thank you for joining [name], PA-C for today's virtual visit.  While this provider is not your primary care provider (PCP), if your PCP is located in our provider database this encounter information will be shared with them immediately following your visit.   A [facility] MyChart account gives you access to today's visit and all your visits, tests, and labs performed at [facility] " click here if you don't have a [facility] MyChart account or go to [email or web address]  Consent: (Patient) Felicie Boudwin Foushee provided verbal consent for this virtual visit at the beginning of the encounter.  Current Medications:  Current Outpatient Medications:    acetaminophen  (TYLENOL ) 500 MG tablet, Take 2 tablets (1,000 mg total) by mouth every 8 (eight) hours as needed (pain)., Disp: 60 tablet, Rfl: 0   albuterol  (VENTOLIN  HFA) 108 (90 Base) MCG/ACT inhaler, Inhale 1-2 puffs into the lungs every 6 (six) hours as needed., Disp: 8 g, Rfl: 0   ALPRAZolam  (XANAX ) 1 MG tablet, Take 1 mg by mouth daily as needed., Disp: , Rfl:    ARIPiprazole  (ABILIFY  IM), Inject 300 mg into the muscle every 30 (thirty) days., Disp: , Rfl:    Blood Pressure Monitoring (BLOOD PRESSURE KIT) DEVI, 1 Device by Does not apply route once a week., Disp: 1 each, Rfl: 0   chlorproMAZINE (THORAZINE) 25 MG tablet, Take 25 mg by mouth at bedtime., Disp: , Rfl:    docusate sodium  (COLACE) 100 MG capsule, Take 1 capsule (100 mg total) by mouth 2 (two) times daily., Disp: 30 capsule, Rfl: 0   fluticasone  (FLONASE ) 50 MCG/ACT nasal spray, Place 2 sprays into both nostrils daily., Disp: 16 g, Rfl: 0   fluticasone -salmeterol (ADVAIR HFA) 115-21 MCG/ACT inhaler, Inhale 2 puffs into the lungs 2 (two) times daily., Disp: 1 each, Rfl: 1   ibuprofen  (ADVIL ) 800 MG tablet, Take 1 tablet (800 mg total) by mouth every 8 (eight) hours as needed., Disp: 30 tablet, Rfl: 0   loratadine-pseudoephedrine  (CLARITIN-D 24 HOUR) 10-240 MG 24 hr tablet, Take 1 tablet by mouth daily., Disp: 30 tablet, Rfl: 0   nystatin  (MYCOSTATIN /NYSTOP ) powder, Apply 1 Application topically 3 (three) times daily., Disp: 60 g, Rfl: 0   nystatin  cream (MYCOSTATIN ), Apply 1 Application topically 2 (two) times daily., Disp: 30 g, Rfl: 1   ondansetron  (ZOFRAN -ODT) 4 MG disintegrating tablet, Take 1 tablet (4 mg total) by mouth every 8 (eight) hours as needed for nausea or vomiting., Disp: 20 tablet, Rfl: 0   pantoprazole  (PROTONIX ) 40 MG tablet, Take 1 tablet (40 mg total) by mouth daily., Disp: 90 tablet, Rfl: 0   propranolol (INDERAL) 20 MG tablet, Take by mouth., Disp: , Rfl:    VRAYLAR 1.5 MG capsule, Take 1.5 mg by mouth daily., Disp: , Rfl:    Medications ordered in this encounter:  No orders of the defined types were placed in this encounter.    *If you need refills on other medications prior to your next appointment, please contact your pharmacy*  Follow-Up: Call back or seek an in-person evaluation if the symptoms worsen or if the condition fails to improve as anticipated.  [facility] [phone]  Other Instructions Continue care as directed at time of yesterday's visit. Make sure to add on the prednisone  and restart use of your albuterol . If symptoms are not turning the corner over the next 48 hours or any progressive symptoms despite treatment, you must be seen in person.  If you have been instructed to have an in-person evaluation today at a local Urgent Care facility, please use the link below. It will take you to a list of all of our available [facility], including address, phone number and hours of operation. Please do not delay care.  [facility]  If you or a family member do not have a primary care provider, use the link below to schedule a visit and establish care. When you choose a [facility] primary care physician or advanced practice provider, you  gain a long-term partner in health. Find a Primary Care Provider  Learn more about [facility]'s in-office and virtual care options: [facility] - Get Care Now

## 2024-04-30 ENCOUNTER — Telehealth: Admitting: Emergency Medicine

## 2024-04-30 DIAGNOSIS — M791 Myalgia, unspecified site: Secondary | ICD-10-CM | POA: Diagnosis not present

## 2024-04-30 MED ORDER — CYCLOBENZAPRINE HCL 5 MG PO TABS: 5.0000 mg | ORAL_TABLET | Freq: Three times a day (TID) | ORAL | 0 refills | Status: AC | PRN

## 2024-04-30 NOTE — Progress Notes (Signed)
 Virtual Visit Consent   [name], you are scheduled for a virtual visit with a [facility] provider today. Just as with appointments in the office, your consent must be obtained to participate. Your consent will be active for this visit and any virtual visit you may have with one of our providers in the next 365 days. If you have a MyChart account, a copy of this consent can be sent to you electronically.  As this is a virtual visit, video technology does not allow for your provider to perform a traditional examination. This may limit your provider's ability to fully assess your condition. If your provider identifies any concerns that need to be evaluated in person or the need to arrange testing (such as labs, EKG, etc.), we will make arrangements to do so. Although advances in technology are sophisticated, we cannot ensure that it will always work on either your end or our end. If the connection with a video visit is poor, the visit may have to be switched to a telephone visit. With either a video or telephone visit, we are not always able to ensure that we have a secure connection.  By engaging in this virtual visit, you consent to the provision of healthcare and authorize for your insurance to be billed (if applicable) for the services provided during this visit. Depending on your insurance coverage, you may receive a charge related to this service.  I need to obtain your verbal consent now. Are you willing to proceed with your visit today? [name] has provided verbal consent on 04/30/2024 for a virtual visit (video or telephone). Blinda Burger, NP  Date: 04/30/2024 2:36 PM   Virtual Visit via Video Note   I, Blinda Burger, connected with  [name]  ([id number], [birth year]) on 04/30/24 at  2:30 PM EDT by a video-enabled telemedicine application and verified that I am speaking with the correct person using two identifiers.  Location: Patient: Virtual Visit Location  Patient: Other: grocery store in [place], [place] Provider: Virtual Visit Location Provider: Home Office   I discussed the limitations of evaluation and management by telemedicine and the availability of in person appointments. The patient expressed understanding and agreed to proceed.    History of Present Illness: [name] is a 38 y.o. who identifies as a female who was assigned female at birth, and is being seen today for RLE pain.  Yesterday she fell, tripped over a long weed in the grass. Fell and hurt RLE. Entire back of leg hurts, especially back of knee. Sharp pain when walks a few steps. Feels like pulled a muscle. No numbness or weakness. Worse today after resting overnight.  She denies hitting her head or any other injury  She is taking ibuprofen  800mg  po every 6 hours for some relief of pain.  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     Methylphenidate  Derivatives Other (See Comments)    Reaction:  Depression and anger    Neurontin [Gabapentin] Other (See Comments)     Reaction:  Dizziness    Prozac  [Fluoxetine  Hcl] Other (See Comments)    Reaction:  Anger    Methylphenidate  Hcl     SAME AS PREVIOUS LISTED METHYLPHENIDATE    Medications:  Current Outpatient Medications:    cyclobenzaprine  (FLEXERIL ) 5 MG tablet, Take 1 tablet (5 mg total) by mouth 3 (three) times daily as needed for muscle spasms., Disp: 21 tablet, Rfl: 0   acetaminophen  (TYLENOL ) 500 MG tablet, Take 2 tablets (1,000 mg total) by mouth every 8 (eight) hours as needed (pain)., Disp: 60 tablet, Rfl: 0   albuterol  (VENTOLIN  HFA) 108 (90 Base) MCG/ACT inhaler, Inhale 1-2 puffs into the lungs every 6 (six) hours as needed., Disp: 8 g, Rfl: 0   ALPRAZolam  (XANAX ) 1 MG tablet, Take 1 mg by mouth daily as needed., Disp: , Rfl:    ARIPiprazole  (ABILIFY  IM), Inject 300 mg into the muscle every 30 (thirty) days., Disp: , Rfl:    Blood Pressure Monitoring (BLOOD PRESSURE KIT) DEVI, 1 Device by Does not apply route once a week., Disp: 1 each, Rfl: 0   chlorproMAZINE (THORAZINE) 25 MG tablet, Take 25 mg by mouth at bedtime., Disp: , Rfl:    docusate sodium  (COLACE) 100 MG capsule, Take 1 capsule (100 mg total) by mouth 2 (two) times daily., Disp: 30 capsule, Rfl: 0   fluticasone  (FLONASE ) 50 MCG/ACT nasal spray, Place 2 sprays into both nostrils daily., Disp: 16 g, Rfl: 0   fluticasone -salmeterol (ADVAIR HFA) 115-21 MCG/ACT inhaler, Inhale 2 puffs into the lungs 2 (two) times daily., Disp: 1 each, Rfl: 1   ibuprofen  (ADVIL ) 800 MG tablet, Take 1 tablet (800 mg total) by mouth every 8 (eight) hours as needed., Disp: 30 tablet, Rfl: 0   loratadine -pseudoephedrine (CLARITIN -D 24 HOUR) 10-240 MG 24 hr tablet, Take 1 tablet by mouth daily., Disp: 30 tablet, Rfl: 0   nystatin  (MYCOSTATIN /NYSTOP ) powder, Apply 1 Application topically 3 (three) times daily., Disp: 60 g, Rfl: 0   nystatin  cream (MYCOSTATIN ), Apply 1 Application topically 2 (two) times daily., Disp: 30 g, Rfl: 1   ondansetron  (ZOFRAN -ODT) 4 MG  disintegrating tablet, Take 1 tablet (4 mg total) by mouth every 8 (eight) hours as needed for nausea or vomiting., Disp: 20 tablet, Rfl: 0   pantoprazole  (PROTONIX ) 40 MG tablet, Take 1 tablet (40 mg total) by mouth daily., Disp: 90 tablet, Rfl: 0   predniSONE  (STERAPRED UNI-PAK 21 TAB) 10 MG (21) TBPK tablet, Take following package directions, Disp: 21 tablet, Rfl: 0   propranolol (INDERAL) 20 MG tablet, Take by mouth., Disp: , Rfl:    VRAYLAR 1.5 MG capsule, Take 1.5 mg by mouth daily., Disp: , Rfl:   Observations/Objective: Patient is well-developed, well-nourished in no acute distress.  Walking while pushing a grocery cart through store during visit.  Head is normocephalic, atraumatic.  No labored breathing.  Speech is clear and coherent with logical content.  Patient is alert and oriented at baseline.    Assessment and Plan: 1. Muscle pain (Primary)  Advised her to reduce ibuprofen  intake to no more than  803 times a day.  I will prescribe a muscle relaxer.  I will share contact information for sports medicine with her  Follow Up Instructions: I discussed the assessment and treatment plan with the patient. The patient was provided an opportunity to ask questions and all were answered. The patient agreed with the plan and demonstrated an understanding of the instructions.  A copy of instructions were sent to the patient via MyChart unless otherwise noted below.   The patient was advised to call back or seek an in-person evaluation if the symptoms worsen or if the condition fails to improve as anticipated.    Blinda Burger, NP

## 2024-04-30 NOTE — Patient Instructions (Addendum)
 [employer], thank you for joining Blinda Burger, NP for today's virtual visit.  While this provider is not your primary care provider (PCP), if your PCP is located in our provider database this encounter information will be shared with them immediately following your visit.   A [facility] MyChart account gives you access to today's visit and all your visits, tests, and labs performed at [facility] " click here if you don't have a [facility] MyChart account or go to [email or web address]  Consent: (Patient) [name] provided verbal consent for this virtual visit at the beginning of the encounter.  Current Medications:  Current Outpatient Medications:    cyclobenzaprine  (FLEXERIL ) 5 MG tablet, Take 1 tablet (5 mg total) by mouth 3 (three) times daily as needed for muscle spasms., Disp: 21 tablet, Rfl: 0   acetaminophen  (TYLENOL ) 500 MG tablet, Take 2 tablets (1,000 mg total) by mouth every 8 (eight) hours as needed (pain)., Disp: 60 tablet, Rfl: 0   albuterol  (VENTOLIN  HFA) 108 (90 Base) MCG/ACT inhaler, Inhale 1-2 puffs into the lungs every 6 (six) hours as needed., Disp: 8 g, Rfl: 0   ALPRAZolam  (XANAX ) 1 MG tablet, Take 1 mg by mouth daily as needed., Disp: , Rfl:    ARIPiprazole  (ABILIFY  IM), Inject 300 mg into the muscle every 30 (thirty) days., Disp: , Rfl:    Blood Pressure Monitoring (BLOOD PRESSURE KIT) DEVI, 1 Device by Does not apply route once a week., Disp: 1 each, Rfl: 0   chlorproMAZINE (THORAZINE) 25 MG tablet, Take 25 mg by mouth at bedtime., Disp: , Rfl:    docusate sodium  (COLACE) 100 MG capsule, Take 1 capsule (100 mg total) by mouth 2 (two) times daily., Disp: 30 capsule, Rfl: 0   fluticasone  (FLONASE ) 50 MCG/ACT nasal spray, Place 2 sprays into both nostrils daily., Disp: 16 g, Rfl: 0   fluticasone -salmeterol (ADVAIR HFA) 115-21 MCG/ACT inhaler, Inhale 2 puffs into the lungs 2 (two) times daily., Disp: 1 each, Rfl: 1   ibuprofen  (ADVIL )  800 MG tablet, Take 1 tablet (800 mg total) by mouth every 8 (eight) hours as needed., Disp: 30 tablet, Rfl: 0   loratadine -pseudoephedrine (CLARITIN -D 24 HOUR) 10-240 MG 24 hr tablet, Take 1 tablet by mouth daily., Disp: 30 tablet, Rfl: 0   nystatin  (MYCOSTATIN /NYSTOP ) powder, Apply 1 Application topically 3 (three) times daily., Disp: 60 g, Rfl: 0   nystatin  cream (MYCOSTATIN ), Apply 1 Application topically 2 (two) times daily., Disp: 30 g, Rfl: 1   ondansetron  (ZOFRAN -ODT) 4 MG disintegrating tablet, Take 1 tablet (4 mg total) by mouth every 8 (eight) hours as needed for nausea or vomiting., Disp: 20 tablet, Rfl: 0   pantoprazole  (PROTONIX ) 40 MG tablet, Take 1 tablet (40 mg total) by mouth daily., Disp: 90 tablet, Rfl: 0   predniSONE  (STERAPRED UNI-PAK 21 TAB) 10 MG (21) TBPK tablet, Take following package directions, Disp: 21 tablet, Rfl: 0   propranolol (INDERAL) 20 MG tablet, Take by mouth., Disp: , Rfl:    VRAYLAR 1.5 MG capsule, Take 1.5 mg by mouth daily., Disp: , Rfl:    Medications ordered in this encounter:  Meds ordered this encounter  Medications   cyclobenzaprine  (FLEXERIL ) 5 MG tablet    Sig: Take 1 tablet (5 mg total) by mouth 3 (three) times daily as needed for muscle spasms.    Dispense:  21 tablet    Refill:  0     *If you need refills on other medications prior  to your next appointment, please contact your pharmacy*  Follow-Up: Call back or seek an in-person evaluation if the symptoms worsen or if the condition fails to improve as anticipated.  [facility] [phone]  Other Instructions Do not take ibuprofen  800 mg more often than 3 times a day I have prescribed a muscle relaxer.  If you are not feeling like you are getting better, please be seen in person either at an urgent care or at a sports medicine facility  [facility] [address] [place], [place] [address] [phone]    If you have been instructed to have an  in-person evaluation today at a local Urgent Care facility, please use the link below. It will take you to a list of all of our available [facility], including address, phone number and hours of operation. Please do not delay care.  [facility]  If you or a family member do not have a primary care provider, use the link below to schedule a visit and establish care. When you choose a [facility] primary care physician or advanced practice provider, you gain a long-term partner in health. Find a Primary Care Provider  Learn more about [facility]'s in-office and virtual care options: [facility] - Get Care Now

## 2024-05-08 ENCOUNTER — Telehealth

## 2024-05-26 ENCOUNTER — Telehealth: Admitting: Physician Assistant

## 2024-05-26 ENCOUNTER — Telehealth

## 2024-05-26 DIAGNOSIS — R630 Anorexia: Secondary | ICD-10-CM

## 2024-05-26 DIAGNOSIS — R5383 Other fatigue: Secondary | ICD-10-CM

## 2024-05-26 NOTE — Patient Instructions (Signed)
 [name], thank you for joining [name], PA-C for today's virtual visit.  While this provider is not your primary care provider (PCP), if your PCP is located in our provider database this encounter information will be shared with them immediately following your visit.   A [facility] MyChart account gives you access to today's visit and all your visits, tests, and labs performed at [facility]  click here if you don't have a [facility] MyChart account or go to [email or web address]  Consent: (Patient) [name] provided verbal consent for this virtual visit at the beginning of the encounter.  Current Medications:  Current Outpatient Medications:    acetaminophen  (TYLENOL ) 500 MG tablet, Take 2 tablets (1,000 mg total) by mouth every 8 (eight) hours as needed (pain)., Disp: 60 tablet, Rfl: 0   albuterol  (VENTOLIN  HFA) 108 (90 Base) MCG/ACT inhaler, Inhale 1-2 puffs into the lungs every 6 (six) hours as needed., Disp: 8 g, Rfl: 0   ALPRAZolam  (XANAX ) 1 MG tablet, Take 1 mg by mouth daily as needed., Disp: , Rfl:    ARIPiprazole  (ABILIFY  IM), Inject 300 mg into the muscle every 30 (thirty) days., Disp: , Rfl:    Blood Pressure Monitoring (BLOOD PRESSURE KIT) DEVI, 1 Device by Does not apply route once a week., Disp: 1 each, Rfl: 0   chlorproMAZINE (THORAZINE) 25 MG tablet, Take 25 mg by mouth at bedtime., Disp: , Rfl:    cyclobenzaprine  (FLEXERIL ) 5 MG tablet, Take 1 tablet (5 mg total) by mouth 3 (three) times daily as needed for muscle spasms., Disp: 21 tablet, Rfl: 0   docusate sodium  (COLACE) 100 MG capsule, Take 1 capsule (100 mg total) by mouth 2 (two) times daily., Disp: 30 capsule, Rfl: 0   fluticasone  (FLONASE ) 50 MCG/ACT nasal spray, Place 2 sprays into both nostrils daily., Disp: 16 g, Rfl: 0   fluticasone -salmeterol (ADVAIR HFA) 115-21 MCG/ACT inhaler, Inhale 2 puffs into the lungs 2 (two) times daily., Disp: 1 each, Rfl: 1   ibuprofen   (ADVIL ) 800 MG tablet, Take 1 tablet (800 mg total) by mouth every 8 (eight) hours as needed., Disp: 30 tablet, Rfl: 0   loratadine -pseudoephedrine (CLARITIN -D 24 HOUR) 10-240 MG 24 hr tablet, Take 1 tablet by mouth daily., Disp: 30 tablet, Rfl: 0   nystatin  (MYCOSTATIN /NYSTOP ) powder, Apply 1 Application topically 3 (three) times daily., Disp: 60 g, Rfl: 0   nystatin  cream (MYCOSTATIN ), Apply 1 Application topically 2 (two) times daily., Disp: 30 g, Rfl: 1   ondansetron  (ZOFRAN -ODT) 4 MG disintegrating tablet, Take 1 tablet (4 mg total) by mouth every 8 (eight) hours as needed for nausea or vomiting., Disp: 20 tablet, Rfl: 0   pantoprazole  (PROTONIX ) 40 MG tablet, Take 1 tablet (40 mg total) by mouth daily., Disp: 90 tablet, Rfl: 0   predniSONE  (STERAPRED UNI-PAK 21 TAB) 10 MG (21) TBPK tablet, Take following package directions, Disp: 21 tablet, Rfl: 0   propranolol (INDERAL) 20 MG tablet, Take by mouth., Disp: , Rfl:    VRAYLAR 1.5 MG capsule, Take 1.5 mg by mouth daily., Disp: , Rfl:    Medications ordered in this encounter:  No orders of the defined types were placed in this encounter.    *If you need refills on other medications prior to your next appointment, please contact your pharmacy*  Follow-Up: Call back or seek an in-person evaluation if the symptoms worsen or if the condition fails to improve as anticipated.  [facility] [phone]  Other Instructions Please stay well-hydrated and rest. Try to start a well-balanced diet to make sure you are getting nutrients, making sure not to skip meals even if you do not feel hungry. Start an OTC multivitamin. You need an in-person follow-up for further assessment,  Please see the link below to help get scheduled.    If you have been instructed to have an in-person evaluation today at a local Urgent Care facility, please use the link below. It will take you to a list of all of our available [facility],  including address, phone number and hours of operation. Please do not delay care.  [facility]  If you or a family member do not have a primary care provider, use the link below to schedule a visit and establish care. When you choose a [facility] primary care physician or advanced practice provider, you gain a long-term partner in health. Find a Primary Care Provider  Learn more about [facility]'s in-office and virtual care options: [facility] - Get Care Now

## 2024-05-26 NOTE — Progress Notes (Signed)
 Virtual Visit Consent   [name], you are scheduled for a virtual visit with a [facility] provider today. Just as with appointments in the office, your consent must be obtained to participate. Your consent will be active for this visit and any virtual visit you may have with one of our providers in the next 365 days. If you have a MyChart account, a copy of this consent can be sent to you electronically.  As this is a virtual visit, video technology does not allow for your provider to perform a traditional examination. This may limit your provider's ability to fully assess your condition. If your provider identifies any concerns that need to be evaluated in person or the need to arrange testing (such as labs, EKG, etc.), we will make arrangements to do so. Although advances in technology are sophisticated, we cannot ensure that it will always work on either your end or our end. If the connection with a video visit is poor, the visit may have to be switched to a telephone visit. With either a video or telephone visit, we are not always able to ensure that we have a secure connection.  By engaging in this virtual visit, you consent to the provision of healthcare and authorize for your insurance to be billed (if applicable) for the services provided during this visit. Depending on your insurance coverage, you may receive a charge related to this service.  I need to obtain your verbal consent now. Are you willing to proceed with your visit today? [name] has provided verbal consent on 05/26/2024 for a virtual visit (video or telephone). [name], [name]  Date: 05/26/2024 3:10 PM   Virtual Visit via Video Note   I, [name], connected with  [name]  ([id number], 12/28/1985) on 05/26/24 at  2:45 PM EDT by a video-enabled telemedicine application and verified that I am speaking with the correct person using two identifiers.  Location: Patient: Virtual  Visit Location Patient: Home Provider: Virtual Visit Location Provider: Home Office   I discussed the limitations of evaluation and management by telemedicine and the availability of in person appointments. The patient expressed understanding and agreed to proceed.    History of Present Illness: [name] is a 38 y.o. who identifies as a female who was assigned female at birth, and is being seen today for decreased appetite over the past few days. This is out of the ordinary for her.  Denies nausea or vomiting. Some chills without fever. Notes fatigue and weakness that she describes as a heaviness and mental fogginess. Denies nasal congestion, sinus pain or tooth pain. Denies actual headache. Denies change to bowel habits, melena or hematochezia. Denies SOB. Kids have been sick this past week but improving.   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     Methylphenidate  Derivatives Other (See Comments)    Reaction:  Depression and anger    Neurontin [Gabapentin] Other (See Comments)     Reaction:  Dizziness    Prozac  [Fluoxetine  Hcl] Other (See Comments)    Reaction:  Anger    Methylphenidate  Hcl     SAME AS PREVIOUS LISTED METHYLPHENIDATE    Medications:  Current Outpatient Medications:    acetaminophen  (TYLENOL ) 500 MG tablet, Take 2 tablets (1,000 mg total) by mouth every 8 (eight) hours as needed (pain)., Disp: 60 tablet, Rfl: 0   albuterol  (VENTOLIN  HFA) 108 (90 Base) MCG/ACT inhaler, Inhale 1-2 puffs into the lungs every 6 (six) hours as needed., Disp: 8 g, Rfl: 0   ALPRAZolam  (XANAX ) 1 MG tablet, Take 1 mg by mouth daily as needed., Disp: , Rfl:    ARIPiprazole  (ABILIFY  IM), Inject 300 mg into the muscle every 30 (thirty) days., Disp: , Rfl:    Blood Pressure Monitoring (BLOOD PRESSURE KIT) DEVI, 1 Device by Does not apply route once a week., Disp: 1 each, Rfl: 0   chlorproMAZINE (THORAZINE) 25 MG tablet, Take 25 mg by mouth at bedtime., Disp: , Rfl:    cyclobenzaprine  (FLEXERIL ) 5 MG tablet, Take 1 tablet (5 mg total) by mouth 3 (three) times daily as needed for muscle spasms., Disp: 21 tablet, Rfl: 0   docusate sodium  (COLACE) 100 MG capsule, Take 1 capsule (100 mg total) by mouth 2 (two) times daily., Disp: 30 capsule, Rfl: 0   fluticasone  (FLONASE ) 50 MCG/ACT nasal spray, Place 2 sprays into both nostrils daily., Disp: 16 g, Rfl: 0   fluticasone -salmeterol (ADVAIR HFA) 115-21 MCG/ACT inhaler, Inhale 2 puffs into the lungs 2 (two) times daily., Disp: 1 each, Rfl: 1   ibuprofen  (ADVIL ) 800 MG tablet, Take 1 tablet (800 mg total) by mouth every 8 (eight) hours as needed., Disp: 30 tablet, Rfl: 0   loratadine -pseudoephedrine (CLARITIN -D 24 HOUR) 10-240 MG 24 hr tablet, Take 1 tablet by mouth daily., Disp: 30 tablet, Rfl: 0   nystatin  (MYCOSTATIN /NYSTOP ) powder, Apply 1 Application topically 3 (three) times daily., Disp: 60 g, Rfl: 0   nystatin  cream (MYCOSTATIN ), Apply 1 Application topically 2 (two) times daily., Disp: 30 g, Rfl: 1   ondansetron  (ZOFRAN -ODT) 4 MG  disintegrating tablet, Take 1 tablet (4 mg total) by mouth every 8 (eight) hours as needed for nausea or vomiting., Disp: 20 tablet, Rfl: 0   pantoprazole  (PROTONIX ) 40 MG tablet, Take 1 tablet (40 mg total) by mouth daily., Disp: 90 tablet, Rfl: 0   predniSONE  (STERAPRED UNI-PAK 21 TAB) 10 MG (21) TBPK tablet, Take following package directions, Disp: 21 tablet, Rfl: 0   propranolol (INDERAL) 20 MG tablet, Take by mouth., Disp: , Rfl:    VRAYLAR 1.5 MG capsule, Take 1.5 mg by mouth daily., Disp: , Rfl:   Observations/Objective: Patient is well-developed, well-nourished in no acute distress.  Resting comfortably at home.  Head is normocephalic, atraumatic.  No labored breathing.  Speech is clear and coherent with logical content.  Patient is alert and oriented at baseline.   Assessment and Plan: 1. Anorexia (Primary)  2. Other fatigue  Unclear etiology. Family members sick recently so could be prodrome of a viral illness. Could also be vitamin/nutrient deficiency, anemia, etc. Discussed need for further evaluation  in person. Recommend increased fluids, bland diet, and multivitamin until being evaluated in person. ER precautions reviewed.  Follow Up Instructions: I discussed the assessment and treatment plan with the patient. The patient was provided an opportunity to ask questions and all were answered. The patient agreed with the plan and demonstrated an understanding of the instructions.  A copy of instructions were sent to the patient via MyChart unless otherwise noted below.   The patient was advised to call back or seek an in-person evaluation if the symptoms worsen or if the condition fails to improve as anticipated.    Nichole Maillard, PA-C

## 2024-05-27 ENCOUNTER — Telehealth: Admitting: Nurse Practitioner

## 2024-05-27 DIAGNOSIS — R110 Nausea: Secondary | ICD-10-CM

## 2024-05-27 DIAGNOSIS — N946 Dysmenorrhea, unspecified: Secondary | ICD-10-CM | POA: Diagnosis not present

## 2024-05-27 MED ORDER — ONDANSETRON 8 MG PO TBDP: 8.0000 mg | ORAL_TABLET | Freq: Three times a day (TID) | ORAL | 0 refills | Status: DC | PRN

## 2024-05-27 MED ORDER — IBUPROFEN 800 MG PO TABS: 800.0000 mg | ORAL_TABLET | Freq: Three times a day (TID) | ORAL | 1 refills | Status: DC | PRN

## 2024-05-27 NOTE — Progress Notes (Signed)
 Virtual Visit Consent   [name], you are scheduled for a virtual visit with a [facility] provider today. Just as with appointments in the office, your consent must be obtained to participate. Your consent will be active for this visit and any virtual visit you may have with one of our providers in the next 365 days. If you have a MyChart account, a copy of this consent can be sent to you electronically.  As this is a virtual visit, video technology does not allow for your provider to perform a traditional examination. This may limit your provider's ability to fully assess your condition. If your provider identifies any concerns that need to be evaluated in person or the need to arrange testing (such as labs, EKG, etc.), we will make arrangements to do so. Although advances in technology are sophisticated, we cannot ensure that it will always work on either your end or our end. If the connection with a video visit is poor, the visit may have to be switched to a telephone visit. With either a video or telephone visit, we are not always able to ensure that we have a secure connection.  By engaging in this virtual visit, you consent to the provision of healthcare and authorize for your insurance to be billed (if applicable) for the services provided during this visit. Depending on your insurance coverage, you may receive a charge related to this service.  I need to obtain your verbal consent now. Are you willing to proceed with your visit today? [name] has provided verbal consent on 05/27/2024 for a virtual visit (video or telephone). Mardene Shake, FNP  Date: 05/27/2024 12:13 PM   Virtual Visit via Video Note   I, Mardene Shake, connected with  [name]  ([id number], 1986-06-05) on 05/27/24 at 12:15 PM EDT by a video-enabled telemedicine application and verified that I am speaking with the correct person using two identifiers.  Location: Patient: Virtual Visit Location  Patient: Home Provider: Virtual Visit Location Provider: Home Office   I discussed the limitations of evaluation and management by telemedicine and the availability of in person appointments. The patient expressed understanding and agreed to proceed.    History of Present Illness: [name] is a 38 y.o. who identifies as a female who was assigned female at birth, and is being seen today for nausea.  She was seen yesterday with vague complaints of decreased appetite without nausea.  Today she has started to feel nauseated, and she also started her menstraul cycle.  She believes now that her pre menstraul status was the cause of symptoms from yesterday   Patient has had success with prescription ibuprofen  for cramp relief in the past  She has also used zofran  in the past for nausea but has run out currently   Denies any other associated symptoms today She has had exposure to viral illnesses from her children as well   Denies chance of pregnancy   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HCC) 06/10/2022   Postoperative abdominal pain 04/09/2022   Numbness and tingling in right hand 04/04/2022   Unwanted fertility 12/19/2021   Gestational hypertension 03/16/2020   ADHD (attention deficit hyperactivity disorder), 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      Methylphenidate  Derivatives Other (See Comments)    Reaction:  Depression and anger    Neurontin [Gabapentin] Other (See Comments)    Reaction:  Dizziness    Prozac  [Fluoxetine  Hcl] Other (See Comments)    Reaction:  Anger    Methylphenidate  Hcl     SAME AS PREVIOUS LISTED METHYLPHENIDATE    Medications:  Current Outpatient Medications:    acetaminophen  (TYLENOL ) 500 MG tablet, Take 2 tablets (1,000 mg total) by mouth every 8 (eight) hours as needed (pain)., Disp: 60 tablet, Rfl: 0   albuterol  (VENTOLIN  HFA) 108 (90 Base) MCG/ACT inhaler, Inhale 1-2 puffs into the lungs every 6 (six) hours as needed., Disp: 8 g, Rfl: 0   ALPRAZolam  (XANAX ) 1 MG tablet, Take 1 mg by mouth daily as needed., Disp: , Rfl:    ARIPiprazole  (ABILIFY  IM), Inject 300 mg into the muscle every 30 (thirty) days., Disp: , Rfl:    Blood Pressure Monitoring (BLOOD PRESSURE KIT) DEVI, 1 Device by Does not apply route once a week., Disp: 1 each, Rfl: 0   chlorproMAZINE (THORAZINE) 25 MG tablet, Take 25 mg by mouth at bedtime., Disp: , Rfl:    cyclobenzaprine  (FLEXERIL ) 5 MG tablet, Take 1 tablet (5 mg total) by mouth 3 (three) times daily as needed for muscle spasms., Disp: 21 tablet, Rfl: 0   docusate sodium  (COLACE) 100 MG capsule, Take 1 capsule (100 mg total) by mouth 2 (two) times daily., Disp: 30 capsule, Rfl: 0   fluticasone  (FLONASE ) 50 MCG/ACT nasal spray, Place 2 sprays into both nostrils daily., Disp: 16 g, Rfl: 0   fluticasone -salmeterol (ADVAIR HFA) 115-21 MCG/ACT inhaler, Inhale 2 puffs into the lungs 2 (two) times daily., Disp: 1 each, Rfl: 1   ibuprofen  (ADVIL ) 800 MG tablet, Take 1 tablet (800 mg total) by mouth every 8 (eight) hours as needed., Disp: 30 tablet, Rfl: 0   loratadine -pseudoephedrine (CLARITIN -D 24 HOUR) 10-240 MG 24 hr tablet, Take 1 tablet by mouth daily., Disp: 30 tablet, Rfl: 0   nystatin  (MYCOSTATIN /NYSTOP ) powder, Apply 1 Application topically 3 (three) times daily., Disp: 60 g, Rfl: 0    nystatin  cream (MYCOSTATIN ), Apply 1 Application topically 2 (two) times daily., Disp: 30 g, Rfl: 1   ondansetron  (ZOFRAN -ODT) 4 MG disintegrating tablet, Take 1 tablet (4 mg total) by mouth every 8 (eight) hours as needed for nausea or vomiting., Disp: 20 tablet, Rfl: 0   pantoprazole  (PROTONIX ) 40 MG tablet, Take 1 tablet (40 mg total) by mouth daily., Disp: 90 tablet, Rfl: 0   predniSONE  (STERAPRED UNI-PAK 21 TAB) 10 MG (21) TBPK tablet, Take following package directions, Disp: 21 tablet, Rfl: 0   propranolol (INDERAL) 20 MG tablet, Take by mouth., Disp: , Rfl:    VRAYLAR 1.5 MG capsule, Take 1.5 mg by mouth daily., Disp: , Rfl:   Observations/Objective: Patient is well-developed, well-nourished in no acute distress.  Resting comfortably  at home.  Head is normocephalic, atraumatic.  No labored breathing.  Speech is clear and coherent with logical content.  Patient is alert and oriented at baseline.    Assessment and Plan:  1. Menstrual cramps  2. Nausea  Meds ordered this encounter  Medications   ibuprofen  (ADVIL ) 800 MG tablet    Sig: Take 1 tablet (800 mg total) by mouth every 8 (eight) hours as needed for moderate pain (pain score 4-6) (take with food).    Dispense:  30 tablet    Refill:  1   ondansetron  (ZOFRAN -ODT) 8 MG disintegrating tablet    Sig: Take 1 tablet (8 mg total) by mouth every 8 (eight) hours as needed for nausea or vomiting.    Dispense:  20 tablet    Refill:  0     Follow Up Instructions: I discussed the assessment and treatment plan with the patient. The patient was provided an opportunity to ask questions and all were answered. The patient agreed with the plan and demonstrated an understanding of the instructions.  A copy of instructions were sent to the patient via MyChart unless otherwise noted below.     The patient was advised to call back or seek an in-person evaluation if the symptoms worsen or if the condition fails to improve as anticipated.     Mardene Shake, FNP

## 2024-06-08 ENCOUNTER — Telehealth: Admitting: Physician Assistant

## 2024-06-08 DIAGNOSIS — Z91199 Patient's noncompliance with other medical treatment and regimen due to unspecified reason: Secondary | ICD-10-CM

## 2024-06-08 NOTE — Progress Notes (Signed)
The patient no-showed for appointment despite this provider waiting for at least 10 minutes from appointment time for patient to join. They will be marked as a NS for this appointment/time.   [name], PA-C

## 2024-06-08 NOTE — Progress Notes (Signed)
 Patient did not show up at time of visit. Provider waited additional 10 minutes without patient showing up for visit. Marked as no-show.

## 2024-06-09 ENCOUNTER — Telehealth: Admitting: Physician Assistant

## 2024-06-09 DIAGNOSIS — R110 Nausea: Secondary | ICD-10-CM | POA: Diagnosis not present

## 2024-06-09 MED ORDER — ONDANSETRON 8 MG PO TBDP
8.0000 mg | ORAL_TABLET | Freq: Three times a day (TID) | ORAL | 0 refills | Status: DC | PRN
Start: 2024-06-09 — End: 2024-06-24

## 2024-06-09 NOTE — Progress Notes (Signed)

## 2024-06-15 ENCOUNTER — Telehealth: Admitting: Physician Assistant

## 2024-06-15 DIAGNOSIS — J4521 Mild intermittent asthma with (acute) exacerbation: Secondary | ICD-10-CM

## 2024-06-16 ENCOUNTER — Telehealth

## 2024-06-16 MED ORDER — ALBUTEROL SULFATE HFA 108 (90 BASE) MCG/ACT IN AERS: 2.0000 | INHALATION_SPRAY | Freq: Four times a day (QID) | RESPIRATORY_TRACT | 0 refills | Status: DC | PRN

## 2024-06-16 MED ORDER — PREDNISONE 20 MG PO TABS: 40.0000 mg | ORAL_TABLET | Freq: Every day | ORAL | 0 refills | Status: DC

## 2024-06-16 NOTE — Progress Notes (Signed)
 E-Visit for Asthma  Based on what you have shared with me, it looks like you may have a flare up of your asthma.  Asthma is a chronic (ongoing) lung disease which results in airway obstruction, inflammation and hyper-responsiveness.   Asthma symptoms vary from person to person, with common symptoms including nighttime awakening and decreased ability to participate in normal activities as a result of shortness of breath. It is often triggered by changes in weather, changes in the season, changes in air temperature, or inside (home, school, daycare or work) allergens such as animal dander, mold, mildew, woodstoves or cockroaches.   It can also be triggered by hormonal changes, extreme emotion, physical exertion or an upper respiratory tract illness.     It is important to identify the trigger, and then eliminate or avoid the trigger if possible.   If you have been prescribed medications to be taken on a regular basis, it is important to follow the asthma action plan and to follow guidelines to adjust medication in response to increasing symptoms of decreased peak expiratory flow rate  Treatment: I have prescribed: Albuterol  (Proventil  HFA; Ventolin  HFA) 108 (90 Base) MCG/ACT Inhaler 2 puffs into the lungs every six hours as needed for wheezing or shortness of breath and Prednisone  40mg  by mouth per day for 5  days  HOME CARE Only take medications as instructed by your medical team. Consider wearing a mask or scarf to improve breathing air temperature have been shown to decrease irritation and decrease exacerbations Get rest. Taking a steamy shower or using a humidifier may help nasal congestion sand ease sore throat pain. You can place a towel over your head and breathe in the steam from hot water coming from a faucet. Using a saline nasal spray works much the same way.  Cough drops, hare  candies and sore throat lozenges may ease your cough.  Avoid close contacts especially the very you and the elderly Cover your mouth if you cough or sneeze Always remember to wash your hands.    GET HELP RIGHT AWAY IF: You develop worsening symptoms; breathlessness at rest, drowsy, confused or agitated, unable to speak in full sentences You have coughing fits You develop a severe headache or visual changes You develop shortness of breath, difficulty breathing or start having chest pain Your symptoms persist after you have completed your treatment plan If your symptoms do not improve within 10 days  MAKE SURE YOU Understand these instructions. Will watch your condition. Will get help right away if you are not doing well or get worse.   Your e-visit answers were reviewed by a board certified advanced clinical practitioner to complete your personal care plan, Depending upon the condition, your plan could have included both over the counter or prescription medications.   Please review your pharmacy choice. Your safety is important to us . If you have drug allergies check your prescription carefully.  You can use MyChart to ask questions about today's visit, request a non-urgent  call back, or ask for a work or school excuse for 24 hours related to this e-Visit. If it has been greater than 24 hours you will need to follow up with your provider, or enter a new e-Visit to address those concerns.   You will get an e-mail in the next two days asking about your experience. I hope that your e-visit has been valuable and will speed your recovery. Thank you for using e-visits.

## 2024-06-16 NOTE — Progress Notes (Signed)
 I have spent 5 minutes in review of e-visit questionnaire, review and updating patient chart, medical decision making and response to patient.   [name], PA-C

## 2024-06-17 ENCOUNTER — Telehealth: Admitting: Physician Assistant

## 2024-06-17 DIAGNOSIS — K5900 Constipation, unspecified: Secondary | ICD-10-CM | POA: Diagnosis not present

## 2024-06-17 DIAGNOSIS — Z5321 Procedure and treatment not carried out due to patient leaving prior to being seen by health care provider: Secondary | ICD-10-CM

## 2024-06-17 MED ORDER — DOCUSATE SODIUM 100 MG PO CAPS: 100.0000 mg | ORAL_CAPSULE | Freq: Two times a day (BID) | ORAL | 0 refills | Status: AC

## 2024-06-17 MED ORDER — MAGNESIUM CITRATE PO SOLN: 1.0000 | Freq: Once | ORAL | 0 refills | Status: AC

## 2024-06-17 NOTE — Progress Notes (Signed)
 Patient left without being seen.  Marked No charge.

## 2024-06-17 NOTE — Progress Notes (Signed)
 Virtual Visit Consent   [name], you are scheduled for a virtual visit with a [facility] provider today. Just as with appointments in the office, your consent must be obtained to participate. Your consent will be active for this visit and any virtual visit you may have with one of our providers in the next 365 days. If you have a MyChart account, a copy of this consent can be sent to you electronically.  As this is a virtual visit, video technology does not allow for your provider to perform a traditional examination. This may limit your provider's ability to fully assess your condition. If your provider identifies any concerns that need to be evaluated in person or the need to arrange testing (such as labs, EKG, etc.), we will make arrangements to do so. Although advances in technology are sophisticated, we cannot ensure that it will always work on either your end or our end. If the connection with a video visit is poor, the visit may have to be switched to a telephone visit. With either a video or telephone visit, we are not always able to ensure that we have a secure connection.  By engaging in this virtual visit, you consent to the provision of healthcare and authorize for your insurance to be billed (if applicable) for the services provided during this visit. Depending on your insurance coverage, you may receive a charge related to this service.  I need to obtain your verbal consent now. Are you willing to proceed with your visit today? [name] has provided verbal consent on 06/17/2024 for a virtual visit (video or telephone). [name], [name]  Date: 06/17/2024 3:44 PM   Virtual Visit via Video Note   I, [name], connected with  [name]  ([id number], [birth year]) on 06/17/24 at  3:30 PM EDT by a video-enabled telemedicine application and verified that I am speaking with the correct person using two identifiers.  Location: Patient: Virtual Visit Location  Patient: Home Provider: Virtual Visit Location Provider: Home Office   I discussed the limitations of evaluation and management by telemedicine and the availability of in person appointments. The patient expressed understanding and agreed to proceed.    History of Present Illness: [name] is a 38 y.o. who identifies as a female who was assigned female at birth, and is being seen today for constipation.  HPI: Constipation This is a new problem. The current episode started yesterday. The problem is unchanged. The patient is not on a high fiber diet. She Does not exercise regularly. There has Not been adequate water intake. Pertinent negatives include no abdominal pain, flatus or rectal pain. She has tried laxatives for the symptoms. The treatment provided mild relief.    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     Methylphenidate  Derivatives Other (See Comments)    Reaction:  Depression and anger    Neurontin [Gabapentin] Other (See Comments)    Reaction:  Dizziness    Prozac  [Fluoxetine   Hcl] Other (See Comments)    Reaction:  Anger    Methylphenidate  Hcl     SAME AS PREVIOUS LISTED METHYLPHENIDATE    Medications:  Current Outpatient Medications:    acetaminophen  (TYLENOL ) 500 MG tablet, Take 2 tablets (1,000 mg total) by mouth every 8 (eight) hours as needed (pain)., Disp: 60 tablet, Rfl: 0   albuterol  (VENTOLIN  HFA) 108 (90 Base) MCG/ACT inhaler, Inhale 2 puffs into the lungs every 6 (six) hours as needed for wheezing or shortness of breath., Disp: 8 g, Rfl: 0   ALPRAZolam  (XANAX ) 1 MG tablet, Take 1 mg by mouth daily as needed., Disp: , Rfl:    ARIPiprazole  (ABILIFY  IM), Inject 300 mg into the muscle every 30 (thirty) days., Disp: , Rfl:    Blood Pressure Monitoring (BLOOD PRESSURE KIT) DEVI, 1 Device by Does not apply route once a week., Disp: 1 each, Rfl: 0   chlorproMAZINE (THORAZINE) 25 MG tablet, Take 25 mg by mouth at bedtime., Disp: , Rfl:    cyclobenzaprine  (FLEXERIL ) 5 MG tablet, Take 1 tablet (5 mg total) by mouth 3 (three) times daily as needed for muscle spasms., Disp: 21 tablet, Rfl: 0   docusate sodium  (COLACE) 100 MG capsule, Take 1 capsule (100 mg total) by mouth 2 (two) times daily., Disp: 30 capsule, Rfl: 0   fluticasone  (FLONASE ) 50 MCG/ACT nasal spray, Place 2 sprays into both nostrils daily., Disp: 16 g, Rfl: 0   fluticasone -salmeterol (ADVAIR HFA) 115-21 MCG/ACT inhaler, Inhale 2 puffs into the lungs 2 (two) times daily., Disp: 1 each, Rfl: 1   ibuprofen  (ADVIL ) 800 MG tablet, Take 1 tablet (800 mg total) by mouth every 8 (eight) hours as needed for moderate pain (pain score 4-6) (take with food)., Disp: 30 tablet, Rfl: 1   loratadine -pseudoephedrine (CLARITIN -D 24 HOUR) 10-240 MG 24 hr tablet, Take 1 tablet by mouth daily., Disp: 30 tablet, Rfl: 0   nystatin  (MYCOSTATIN /NYSTOP ) powder, Apply 1 Application topically 3 (three) times daily., Disp: 60 g, Rfl: 0   nystatin  cream (MYCOSTATIN ), Apply 1 Application topically 2 (two) times daily., Disp: 30 g, Rfl:  1   ondansetron  (ZOFRAN -ODT) 8 MG disintegrating tablet, Take 1 tablet (8 mg total) by mouth every 8 (eight) hours as needed for nausea or vomiting., Disp: 20 tablet, Rfl: 0   pantoprazole  (PROTONIX ) 40 MG tablet, Take 1 tablet (40 mg total) by mouth daily., Disp: 90 tablet, Rfl: 0   predniSONE  (DELTASONE ) 20 MG tablet, Take 2 tablets (40 mg total) by mouth daily with breakfast., Disp: 10 tablet, Rfl: 0   propranolol (INDERAL) 20 MG tablet, Take by mouth., Disp: , Rfl:    VRAYLAR 1.5 MG capsule, Take 1.5 mg by mouth daily., Disp: , Rfl:   Observations/Objective: Patient is well-developed, well-nourished in no acute distress.  Resting comfortably  at home.  Head is normocephalic, atraumatic.  No labored breathing.  Speech is clear and coherent with logical content.  Patient is alert and oriented at baseline.    Assessment and Plan: 1. Constipation, unspecified constipation type (Primary)  Patient presenting with 2-day history of constipation.  States she has been unable to have a complete  bowel movement.  She states she has tried Ex-Lax without issue.  I suggested with some magnesium  citrate as well as possible stool softeners to assist with bowel movement.  She does not have any acute abdominal pain or rectal pain at this time.  No emergent or urgent symptoms noted which would need evaluation in the emergency room.  Advised her to attempt a stool softener and a laxative and if her symptoms not improved she will need to report in person for evaluation.  Patient verbalized understanding all questions answered.  Follow Up Instructions: I discussed the assessment and treatment plan with the patient. The patient was provided an opportunity to ask questions and all were answered. The patient agreed with the plan and demonstrated an understanding of the instructions.  A copy of instructions were sent to the patient via MyChart unless otherwise noted below.    The patient was advised to call back or  seek an in-person evaluation if the symptoms worsen or if the condition fails to improve as anticipated.    Nichole Shuck, PA-C

## 2024-06-17 NOTE — Patient Instructions (Signed)
 [employer], thank you for joining [name], PA-C for today's virtual visit.  While this provider is not your primary care provider (PCP), if your PCP is located in our provider database this encounter information will be shared with them immediately following your visit.   A [facility] MyChart account gives you access to today's visit and all your visits, tests, and labs performed at [facility]  click here if you don't have a [facility] MyChart account or go to [email or web address]  Consent: (Patient) [name] provided verbal consent for this virtual visit at the beginning of the encounter.  Current Medications:  Current Outpatient Medications:    acetaminophen  (TYLENOL ) 500 MG tablet, Take 2 tablets (1,000 mg total) by mouth every 8 (eight) hours as needed (pain)., Disp: 60 tablet, Rfl: 0   albuterol  (VENTOLIN  HFA) 108 (90 Base) MCG/ACT inhaler, Inhale 2 puffs into the lungs every 6 (six) hours as needed for wheezing or shortness of breath., Disp: 8 g, Rfl: 0   ALPRAZolam  (XANAX ) 1 MG tablet, Take 1 mg by mouth daily as needed., Disp: , Rfl:    ARIPiprazole  (ABILIFY  IM), Inject 300 mg into the muscle every 30 (thirty) days., Disp: , Rfl:    Blood Pressure Monitoring (BLOOD PRESSURE KIT) DEVI, 1 Device by Does not apply route once a week., Disp: 1 each, Rfl: 0   chlorproMAZINE (THORAZINE) 25 MG tablet, Take 25 mg by mouth at bedtime., Disp: , Rfl:    cyclobenzaprine  (FLEXERIL ) 5 MG tablet, Take 1 tablet (5 mg total) by mouth 3 (three) times daily as needed for muscle spasms., Disp: 21 tablet, Rfl: 0   docusate sodium  (COLACE) 100 MG capsule, Take 1 capsule (100 mg total) by mouth 2 (two) times daily., Disp: 30 capsule, Rfl: 0   fluticasone  (FLONASE ) 50 MCG/ACT nasal spray, Place 2 sprays into both nostrils daily., Disp: 16 g, Rfl: 0   fluticasone -salmeterol (ADVAIR HFA) 115-21 MCG/ACT inhaler, Inhale 2 puffs into the lungs 2 (two) times daily., Disp: 1  each, Rfl: 1   ibuprofen  (ADVIL ) 800 MG tablet, Take 1 tablet (800 mg total) by mouth every 8 (eight) hours as needed for moderate pain (pain score 4-6) (take with food)., Disp: 30 tablet, Rfl: 1   loratadine -pseudoephedrine (CLARITIN -D 24 HOUR) 10-240 MG 24 hr tablet, Take 1 tablet by mouth daily., Disp: 30 tablet, Rfl: 0   nystatin  (MYCOSTATIN /NYSTOP ) powder, Apply 1 Application topically 3 (three) times daily., Disp: 60 g, Rfl: 0   nystatin  cream (MYCOSTATIN ), Apply 1 Application topically 2 (two) times daily., Disp: 30 g, Rfl: 1   ondansetron  (ZOFRAN -ODT) 8 MG disintegrating tablet, Take 1 tablet (8 mg total) by mouth every 8 (eight) hours as needed for nausea or vomiting., Disp: 20 tablet, Rfl: 0   pantoprazole  (PROTONIX ) 40 MG tablet, Take 1 tablet (40 mg total) by mouth daily., Disp: 90 tablet, Rfl: 0   predniSONE  (DELTASONE ) 20 MG tablet, Take 2 tablets (40 mg total) by mouth daily with breakfast., Disp: 10 tablet, Rfl: 0   propranolol (INDERAL) 20 MG tablet, Take by mouth., Disp: , Rfl:    VRAYLAR 1.5 MG capsule, Take 1.5 mg by mouth daily., Disp: , Rfl:    Medications ordered in this encounter:  No orders of the defined types were placed in this encounter.    *If you need refills on other medications prior to your next appointment, please contact your pharmacy*  Follow-Up: Call back or seek an in-person evaluation if the symptoms worsen  or if the condition fails to improve as anticipated.  [facility] [phone]  Other Instructions Please report to the nearest Emergency room with any worsening symptoms. Follow up with primary care provider (PCP) in 2 -3 days.    If you have been instructed to have an in-person evaluation today at a local Urgent Care facility, please use the link below. It will take you to a list of all of our available [facility], including address, phone number and hours of operation. Please do not delay care.  [facility]  If you or a family member do not have a primary care provider, use the link below to schedule a visit and establish care. When you choose a [facility] primary care physician or advanced practice provider, you gain a long-term partner in health. Find a Primary Care Provider  Learn more about [facility]'s in-office and virtual care options:  - Get Care Now

## 2024-06-24 ENCOUNTER — Telehealth

## 2024-06-24 ENCOUNTER — Telehealth: Admitting: Physician Assistant

## 2024-06-24 ENCOUNTER — Encounter

## 2024-06-24 DIAGNOSIS — R112 Nausea with vomiting, unspecified: Secondary | ICD-10-CM

## 2024-06-24 DIAGNOSIS — K219 Gastro-esophageal reflux disease without esophagitis: Secondary | ICD-10-CM

## 2024-06-24 DIAGNOSIS — R110 Nausea: Secondary | ICD-10-CM

## 2024-06-24 MED ORDER — ONDANSETRON 8 MG PO TBDP: 8.0000 mg | ORAL_TABLET | Freq: Three times a day (TID) | ORAL | 0 refills | Status: AC | PRN

## 2024-06-24 MED ORDER — OMEPRAZOLE 20 MG PO CPDR: 20.0000 mg | DELAYED_RELEASE_CAPSULE | Freq: Every day | ORAL | 3 refills | Status: AC

## 2024-06-24 MED ORDER — ONDANSETRON HCL 4 MG PO TABS: 4.0000 mg | ORAL_TABLET | Freq: Three times a day (TID) | ORAL | 0 refills | Status: DC | PRN

## 2024-06-24 NOTE — Patient Instructions (Signed)
 Graylin JONELLE Stallion, thank you for joining [employer], PA-C for today's virtual visit.  While this provider is not your primary care provider (PCP), if your PCP is located in our provider database this encounter information will be shared with them immediately following your visit.   A [facility] MyChart account gives you access to today's visit and all your visits, tests, and labs performed at [facility]  click here if you don't have a [facility] MyChart account or go to [email or web address]  Consent: (Patient) Lilyannah Zuelke Kurihara provided verbal consent for this virtual visit at the beginning of the encounter.  Current Medications:  Current Outpatient Medications:    omeprazole  (PRILOSEC) 20 MG capsule, Take 1 capsule (20 mg total) by mouth daily., Disp: 30 capsule, Rfl: 3   ondansetron  (ZOFRAN ) 4 MG tablet, Take 1 tablet (4 mg total) by mouth every 8 (eight) hours as needed for nausea or vomiting., Disp: 20 tablet, Rfl: 0   acetaminophen  (TYLENOL ) 500 MG tablet, Take 2 tablets (1,000 mg total) by mouth every 8 (eight) hours as needed (pain)., Disp: 60 tablet, Rfl: 0   albuterol  (VENTOLIN  HFA) 108 (90 Base) MCG/ACT inhaler, Inhale 2 puffs into the lungs every 6 (six) hours as needed for wheezing or shortness of breath., Disp: 8 g, Rfl: 0   ALPRAZolam  (XANAX ) 1 MG tablet, Take 1 mg by mouth daily as needed., Disp: , Rfl:    ARIPiprazole  (ABILIFY  IM), Inject 300 mg into the muscle every 30 (thirty) days., Disp: , Rfl:    Blood Pressure Monitoring (BLOOD PRESSURE KIT) DEVI, 1 Device by Does not apply route once a week., Disp: 1 each, Rfl: 0   chlorproMAZINE (THORAZINE) 25 MG tablet, Take 25 mg by mouth at bedtime., Disp: , Rfl:    cyclobenzaprine  (FLEXERIL ) 5 MG tablet, Take 1 tablet (5 mg total) by mouth 3 (three) times daily as needed for muscle spasms., Disp: 21 tablet, Rfl: 0   docusate sodium  (COLACE) 100 MG capsule, Take 1 capsule (100 mg total) by mouth 2 (two) times  daily., Disp: 30 capsule, Rfl: 0   fluticasone  (FLONASE ) 50 MCG/ACT nasal spray, Place 2 sprays into both nostrils daily., Disp: 16 g, Rfl: 0   fluticasone -salmeterol (ADVAIR HFA) 115-21 MCG/ACT inhaler, Inhale 2 puffs into the lungs 2 (two) times daily., Disp: 1 each, Rfl: 1   ibuprofen  (ADVIL ) 800 MG tablet, Take 1 tablet (800 mg total) by mouth every 8 (eight) hours as needed for moderate pain (pain score 4-6) (take with food)., Disp: 30 tablet, Rfl: 1   loratadine -pseudoephedrine (CLARITIN -D 24 HOUR) 10-240 MG 24 hr tablet, Take 1 tablet by mouth daily., Disp: 30 tablet, Rfl: 0   nystatin  (MYCOSTATIN /NYSTOP ) powder, Apply 1 Application topically 3 (three) times daily., Disp: 60 g, Rfl: 0   nystatin  cream (MYCOSTATIN ), Apply 1 Application topically 2 (two) times daily., Disp: 30 g, Rfl: 1   ondansetron  (ZOFRAN -ODT) 8 MG disintegrating tablet, Take 1 tablet (8 mg total) by mouth every 8 (eight) hours as needed for nausea or vomiting., Disp: 20 tablet, Rfl: 0   pantoprazole  (PROTONIX ) 40 MG tablet, Take 1 tablet (40 mg total) by mouth daily., Disp: 90 tablet, Rfl: 0   predniSONE  (DELTASONE ) 20 MG tablet, Take 2 tablets (40 mg total) by mouth daily with breakfast., Disp: 10 tablet, Rfl: 0   propranolol (INDERAL) 20 MG tablet, Take by mouth., Disp: , Rfl:    VRAYLAR 1.5 MG capsule, Take 1.5 mg by mouth daily., Disp: , Rfl:  Medications ordered in this encounter:  Meds ordered this encounter  Medications   ondansetron  (ZOFRAN ) 4 MG tablet    Sig: Take 1 tablet (4 mg total) by mouth every 8 (eight) hours as needed for nausea or vomiting.    Dispense:  20 tablet    Refill:  0    Supervising Provider:   LAMPTEY, PHILIP O [[id number]]   omeprazole  (PRILOSEC) 20 MG capsule    Sig: Take 1 capsule (20 mg total) by mouth daily.    Dispense:  30 capsule    Refill:  3    Supervising Provider:   LAMPTEY, PHILIP O [phone]     *If you need refills on other medications prior to your next appointment,  please contact your pharmacy*  Follow-Up: Call back or seek an in-person evaluation if the symptoms worsen or if the condition fails to improve as anticipated.  [facility] [phone]  Other Instructions Take omeprazole  for GERD at bedtime.  Can take zofran  as needed.  Please follow up for in person visit with primary care physician or Urgent Care if symptoms persists.    If you have been instructed to have an in-person evaluation today at a local Urgent Care facility, please use the link below. It will take you to a list of all of our available [place] [facility], including address, phone number and hours of operation. Please do not delay care.  [facility]  If you or a family member do not have a primary care provider, use the link below to schedule a visit and establish care. When you choose a [place] primary care physician or advanced practice provider, you gain a long-term partner in health. Find a Primary Care Provider  Learn more about [facility]'s in-office and virtual care options: [place] - Get Care Now

## 2024-06-24 NOTE — Progress Notes (Signed)
 Virtual Visit Consent   [name], you are scheduled for a virtual visit with a [facility] provider today. Just as with appointments in the office, your consent must be obtained to participate. Your consent will be active for this visit and any virtual visit you may have with one of our providers in the next 365 days. If you have a MyChart account, a copy of this consent can be sent to you electronically.  As this is a virtual visit, video technology does not allow for your provider to perform a traditional examination. This may limit your provider's ability to fully assess your condition. If your provider identifies any concerns that need to be evaluated in person or the need to arrange testing (such as labs, EKG, etc.), we will make arrangements to do so. Although advances in technology are sophisticated, we cannot ensure that it will always work on either your end or our end. If the connection with a video visit is poor, the visit may have to be switched to a telephone visit. With either a video or telephone visit, we are not always able to ensure that we have a secure connection.  By engaging in this virtual visit, you consent to the provision of healthcare and authorize for your insurance to be billed (if applicable) for the services provided during this visit. Depending on your insurance coverage, you may receive a charge related to this service.  I need to obtain your verbal consent now. Are you willing to proceed with your visit today? [name] has provided verbal consent on 06/24/2024 for a virtual visit (video or telephone). Nichole PEDLAR Ward, PA-C  Date: 06/24/2024 6:20 PM   Virtual Visit via Video Note   I, [name], connected with  [name]  ([id number], 06-02-1986) on 06/24/24 at  6:15 PM EDT by a video-enabled telemedicine application and verified that I am speaking with the correct person using two identifiers.  Location: Patient: Virtual Visit  Location Patient: Home Provider: Virtual Visit Location Provider: Home Office   I discussed the limitations of evaluation and management by telemedicine and the availability of in person appointments. The patient expressed understanding and agreed to proceed.    History of Present Illness: [name] is a 38 y.o. who identifies as a female who was assigned female at birth, and is being seen today for nausea.  Pt reports she has been experiencing nausea first thing in the morning, sometimes also vomiting.  Reports she has a h/o GERD, but is not currently on anything for this.  She denies abdominal pain, constipation, fever, chills.     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     Methylphenidate  Derivatives Other (See Comments)    Reaction:  Depression and anger    Neurontin [Gabapentin] Other (See Comments)    Reaction:  Dizziness    Prozac  [Fluoxetine  Hcl] Other (See Comments)    Reaction:  Anger    Methylphenidate  Hcl     SAME AS PREVIOUS LISTED METHYLPHENIDATE    Medications:   Current Outpatient Medications:    omeprazole  (PRILOSEC) 20 MG capsule, Take 1 capsule (20 mg total) by mouth daily., Disp: 30 capsule, Rfl: 3   ondansetron  (ZOFRAN ) 4 MG tablet, Take 1 tablet (4 mg total) by mouth every 8 (eight) hours as needed for nausea or vomiting., Disp: 20 tablet, Rfl: 0   acetaminophen  (TYLENOL ) 500 MG tablet, Take 2 tablets (1,000 mg total) by mouth every 8 (eight) hours as needed (pain)., Disp: 60 tablet, Rfl: 0   albuterol  (VENTOLIN  HFA) 108 (90 Base) MCG/ACT inhaler, Inhale 2 puffs into the lungs every 6 (six) hours as needed for wheezing or shortness of breath., Disp: 8 g, Rfl: 0   ALPRAZolam  (XANAX ) 1 MG tablet, Take 1 mg by mouth daily as needed., Disp: , Rfl:    ARIPiprazole  (ABILIFY  IM), Inject 300 mg into the muscle every 30 (thirty) days., Disp: , Rfl:    Blood Pressure Monitoring (BLOOD PRESSURE KIT) DEVI, 1 Device by Does not apply route once a week., Disp: 1 each, Rfl: 0   chlorproMAZINE (THORAZINE) 25 MG tablet, Take 25 mg by mouth at bedtime., Disp: , Rfl:    cyclobenzaprine  (FLEXERIL ) 5 MG tablet, Take 1 tablet (5 mg total) by mouth 3 (three) times daily as needed for muscle spasms., Disp: 21 tablet, Rfl: 0   docusate sodium  (COLACE) 100 MG capsule, Take 1 capsule (100 mg total) by mouth 2 (two) times daily., Disp: 30 capsule, Rfl: 0   fluticasone  (FLONASE ) 50 MCG/ACT nasal spray, Place 2 sprays into both nostrils daily., Disp: 16 g, Rfl: 0   fluticasone -salmeterol (ADVAIR HFA) 115-21 MCG/ACT inhaler, Inhale 2 puffs into the lungs 2 (two) times daily., Disp: 1 each, Rfl: 1   ibuprofen  (ADVIL ) 800 MG tablet, Take 1 tablet (800 mg total) by mouth every 8 (eight) hours as needed for moderate pain (pain score 4-6) (take with food)., Disp: 30 tablet, Rfl: 1   loratadine -pseudoephedrine (CLARITIN -D 24 HOUR) 10-240 MG 24 hr tablet, Take 1 tablet by mouth daily., Disp: 30 tablet, Rfl: 0   nystatin  (MYCOSTATIN /NYSTOP ) powder, Apply 1 Application topically 3 (three)  times daily., Disp: 60 g, Rfl: 0   nystatin  cream (MYCOSTATIN ), Apply 1 Application topically 2 (two) times daily., Disp: 30 g, Rfl: 1   ondansetron  (ZOFRAN -ODT) 8 MG disintegrating tablet, Take 1 tablet (8 mg total) by mouth every 8 (eight) hours as needed for nausea or vomiting., Disp: 20 tablet, Rfl: 0   pantoprazole  (PROTONIX ) 40 MG tablet, Take 1 tablet (40 mg total) by mouth daily., Disp: 90 tablet, Rfl: 0   predniSONE  (DELTASONE ) 20 MG tablet, Take 2 tablets (40 mg total) by mouth daily with breakfast., Disp: 10 tablet, Rfl: 0   propranolol (INDERAL) 20 MG tablet, Take by mouth., Disp: , Rfl:    VRAYLAR 1.5 MG capsule, Take 1.5 mg by mouth daily., Disp: , Rfl:   Observations/Objective: Patient is well-developed, well-nourished in no acute distress.  Resting comfortably at home.  Head is normocephalic, atraumatic.  No labored breathing.  Speech is clear and coherent with logical content.  Patient  is alert and oriented at baseline.    Assessment and Plan: 1. Nausea and vomiting, unspecified vomiting type (Primary)  2. Gastroesophageal reflux disease, unspecified whether esophagitis present  N/v in the morning may be associated with GERD.  Prescribed omeprazole  and zofran .   Follow Up Instructions: I discussed the assessment and treatment plan with the patient. The patient was provided an opportunity to ask questions and all were answered. The patient agreed with the plan and demonstrated an understanding of the instructions.  A copy of instructions were sent to the patient via MyChart unless otherwise noted below.     The patient was advised to call back or seek an in-person evaluation if the symptoms worsen or if the condition fails to improve as anticipated.    Nichole PEDLAR Ward, PA-C

## 2024-06-24 NOTE — Progress Notes (Signed)
 Completed a VV earlier. Just needs medication changed.

## 2024-07-20 ENCOUNTER — Telehealth: Admitting: Physician Assistant

## 2024-07-20 DIAGNOSIS — M62838 Other muscle spasm: Secondary | ICD-10-CM

## 2024-07-20 MED ORDER — PREDNISONE 10 MG (21) PO TBPK
ORAL_TABLET | ORAL | 0 refills | Status: AC
Start: 2024-07-20 — End: ?

## 2024-07-20 MED ORDER — BACLOFEN 10 MG PO TABS
10.0000 mg | ORAL_TABLET | Freq: Three times a day (TID) | ORAL | 0 refills | Status: AC
Start: 2024-07-20 — End: ?

## 2024-07-20 NOTE — Patient Instructions (Signed)
 [name], thank you for joining Delon Nichole Dickinson, PA-C for today's virtual visit.  While this provider is not your primary care provider (PCP), if your PCP is located in our provider database this encounter information will be shared with them immediately following your visit.   A [facility] MyChart account gives you access to today's visit and all your visits, tests, and labs performed at [facility]  click here if you don't have a [facility] MyChart account or go to [email or web address]  Consent: (Patient) [name] provided verbal consent for this virtual visit at the beginning of the encounter.  Current Medications:  Current Outpatient Medications:    baclofen  (LIORESAL ) 10 MG tablet, Take 1 tablet (10 mg total) by mouth 3 (three) times daily., Disp: 30 each, Rfl: 0   predniSONE  (STERAPRED UNI-PAK 21 TAB) 10 MG (21) TBPK tablet, 6 day taper, take as directed on package instructions, Disp: 21 tablet, Rfl: 0   acetaminophen  (TYLENOL ) 500 MG tablet, Take 2 tablets (1,000 mg total) by mouth every 8 (eight) hours as needed (pain)., Disp: 60 tablet, Rfl: 0   albuterol  (VENTOLIN  HFA) 108 (90 Base) MCG/ACT inhaler, Inhale 2 puffs into the lungs every 6 (six) hours as needed for wheezing or shortness of breath., Disp: 8 g, Rfl: 0   ALPRAZolam  (XANAX ) 1 MG tablet, Take 1 mg by mouth daily as needed., Disp: , Rfl:    ARIPiprazole  (ABILIFY  IM), Inject 300 mg into the muscle every 30 (thirty) days., Disp: , Rfl:    Blood Pressure Monitoring (BLOOD PRESSURE KIT) DEVI, 1 Device by Does not apply route once a week., Disp: 1 each, Rfl: 0   chlorproMAZINE (THORAZINE) 25 MG tablet, Take 25 mg by mouth at bedtime., Disp: , Rfl:    cyclobenzaprine  (FLEXERIL ) 5 MG tablet, Take 1 tablet (5 mg total) by mouth 3 (three) times daily as needed for muscle spasms., Disp: 21 tablet, Rfl: 0   docusate sodium  (COLACE) 100 MG capsule, Take 1 capsule (100 mg total) by mouth 2 (two)  times daily., Disp: 30 capsule, Rfl: 0   fluticasone  (FLONASE ) 50 MCG/ACT nasal spray, Place 2 sprays into both nostrils daily., Disp: 16 g, Rfl: 0   fluticasone -salmeterol (ADVAIR HFA) 115-21 MCG/ACT inhaler, Inhale 2 puffs into the lungs 2 (two) times daily., Disp: 1 each, Rfl: 1   ibuprofen  (ADVIL ) 800 MG tablet, Take 1 tablet (800 mg total) by mouth every 8 (eight) hours as needed for moderate pain (pain score 4-6) (take with food)., Disp: 30 tablet, Rfl: 1   loratadine -pseudoephedrine (CLARITIN -D 24 HOUR) 10-240 MG 24 hr tablet, Take 1 tablet by mouth daily., Disp: 30 tablet, Rfl: 0   nystatin  (MYCOSTATIN /NYSTOP ) powder, Apply 1 Application topically 3 (three) times daily., Disp: 60 g, Rfl: 0   nystatin  cream (MYCOSTATIN ), Apply 1 Application topically 2 (two) times daily., Disp: 30 g, Rfl: 1   omeprazole  (PRILOSEC) 20 MG capsule, Take 1 capsule (20 mg total) by mouth daily., Disp: 30 capsule, Rfl: 3   ondansetron  (ZOFRAN -ODT) 8 MG disintegrating tablet, Take 1 tablet (8 mg total) by mouth every 8 (eight) hours as needed for nausea or vomiting., Disp: 20 tablet, Rfl: 0   pantoprazole  (PROTONIX ) 40 MG tablet, Take 1 tablet (40 mg total) by mouth daily., Disp: 90 tablet, Rfl: 0   propranolol (INDERAL) 20 MG tablet, Take by mouth., Disp: , Rfl:    VRAYLAR 1.5 MG capsule, Take 1.5 mg by mouth daily., Disp: , Rfl:  Medications ordered in this encounter:  Meds ordered this encounter  Medications   baclofen  (LIORESAL ) 10 MG tablet    Sig: Take 1 tablet (10 mg total) by mouth 3 (three) times daily.    Dispense:  30 each    Refill:  0    Supervising Provider:   BLAISE ALEENE KIDD [[id number]]   predniSONE  (STERAPRED UNI-PAK 21 TAB) 10 MG (21) TBPK tablet    Sig: 6 day taper, take as directed on package instructions    Dispense:  21 tablet    Refill:  0    Supervising Provider:   BLAISE ALEENE KIDD [[id number]]     *If you need refills on other medications prior to your next appointment, please  contact your pharmacy*  Follow-Up: Call back or seek an in-person evaluation if the symptoms worsen or if the condition fails to improve as anticipated.  [facility] [phone]  Other Instructions  Neck Exercises Ask your health care provider which exercises are safe for you. Do exercises exactly as told by your health care provider and adjust them as directed. It is normal to feel mild stretching, pulling, tightness, or discomfort as you do these exercises. Stop right away if you feel sudden pain or your pain gets worse. Do not begin these exercises until told by your health care provider. Neck exercises can be important for many reasons. They can improve strength and maintain flexibility in your neck, which will help your upper back and prevent neck pain. Stretching exercises Rotation neck stretching  Sit in a chair or stand up. Place your feet flat on the floor, shoulder-width apart. Slowly turn your head (rotate) to the right until a slight stretch is felt. Turn it all the way to the right so you can look over your right shoulder. Do not tilt or tip your head. Hold this position for 10-30 seconds. Slowly turn your head (rotate) to the left until a slight stretch is felt. Turn it all the way to the left so you can look over your left shoulder. Do not tilt or tip your head. Hold this position for 10-30 seconds. Repeat __________ times. Complete this exercise __________ times a day. Neck retraction  Sit in a sturdy chair or stand up. Look straight ahead. Do not bend your neck. Use your fingers to push your chin backward (retraction). Do not bend your neck for this movement. Continue to face straight ahead. If you are doing the exercise properly, you will feel a slight sensation in your throat and a stretch at the back of your neck. Hold the stretch for 1-2 seconds. Repeat __________ times. Complete this exercise __________ times a day. Strengthening exercises Neck  press  Lie on your back on a firm bed or on the floor with a pillow under your head. Use your neck muscles to push your head down on the pillow and straighten your spine. Hold the position as well as you can. Keep your head facing up (in a neutral position) and your chin tucked. Slowly count to 5 while holding this position. Repeat __________ times. Complete this exercise __________ times a day. Isometrics These are exercises in which you strengthen the muscles in your neck while keeping your neck still (isometrics). Sit in a supportive chair and place your hand on your forehead. Keep your head and face facing straight ahead. Do not flex or extend your neck while doing isometrics. Push forward with your head and neck while pushing back with your hand.  Hold for 10 seconds. Do the sequence again, this time putting your hand against the back of your head. Use your head and neck to push backward against the hand pressure. Finally, do the same exercise on either side of your head, pushing sideways against the pressure of your hand. Repeat __________ times. Complete this exercise __________ times a day. Prone head lifts  Lie face-down (prone position), resting on your elbows so that your chest and upper back are raised. Start with your head facing downward, near your chest. Position your chin either on or near your chest. Slowly lift your head upward. Lift until you are looking straight ahead. Then continue lifting your head as far back as you can comfortably stretch. Hold your head up for 5 seconds. Then slowly lower it to your starting position. Repeat __________ times. Complete this exercise __________ times a day. Supine head lifts  Lie on your back (supine position), bending your knees to point to the ceiling and keeping your feet flat on the floor. Lift your head slowly off the floor, raising your chin toward your chest. Hold for 5 seconds. Repeat __________ times. Complete this exercise  __________ times a day. Scapular retraction  Stand with your arms at your sides. Look straight ahead. Slowly pull both shoulders (scapulae) backward and downward (retraction) until you feel a stretch between your shoulder blades in your upper back. Hold for 10-30 seconds. Relax and repeat. Repeat __________ times. Complete this exercise __________ times a day. Contact a health care provider if: Your neck pain or discomfort gets worse when you do an exercise. Your neck pain or discomfort does not improve within 2 hours after you exercise. If you have any of these problems, stop exercising right away. Do not do the exercises again unless your health care provider says that you can. Get help right away if: You develop sudden, severe neck pain. If this happens, stop exercising right away. Do not do the exercises again unless your health care provider says that you can. This information is not intended to replace advice given to you by your health care provider. Make sure you discuss any questions you have with your health care provider. Document Revised: 05/28/2021 Document Reviewed: 05/28/2021 Elsevier Patient Education  2024 [employer]   If you have been instructed to have an in-person evaluation today at a local Urgent Care facility, please use the link below. It will take you to a list of all of our available [facility], including address, phone number and hours of operation. Please do not delay care.  [facility]  If you or a family member do not have a primary care provider, use the link below to schedule a visit and establish care. When you choose a [facility] primary care physician or advanced practice provider, you gain a long-term partner in health. Find a Primary Care Provider  Learn more about [facility]'s in-office and virtual care options: [facility] - Get Care Now

## 2024-07-20 NOTE — Progress Notes (Signed)
 Virtual Visit Consent   CEIRRA BELLI, you are scheduled for a virtual visit with a [facility] provider today. Just as with appointments in the office, your consent must be obtained to participate. Your consent will be active for this visit and any virtual visit you may have with one of our providers in the next 365 days. If you have a MyChart account, a copy of this consent can be sent to you electronically.  As this is a virtual visit, video technology does not allow for your provider to perform a traditional examination. This may limit your provider's ability to fully assess your condition. If your provider identifies any concerns that need to be evaluated in person or the need to arrange testing (such as labs, EKG, etc.), we will make arrangements to do so. Although advances in technology are sophisticated, we cannot ensure that it will always work on either your end or our end. If the connection with a video visit is poor, the visit may have to be switched to a telephone visit. With either a video or telephone visit, we are not always able to ensure that we have a secure connection.  By engaging in this virtual visit, you consent to the provision of healthcare and authorize for your insurance to be billed (if applicable) for the services provided during this visit. Depending on your insurance coverage, you may receive a charge related to this service.  I need to obtain your verbal consent now. Are you willing to proceed with your visit today? [name] has provided verbal consent on 07/20/2024 for a virtual visit (video or telephone). Delon CHRISTELLA Dickinson, PA-C  Date: 07/20/2024 2:39 PM   Virtual Visit via Video Note   I, Delon CHRISTELLA Dickinson, connected with  [name]  ([id number], August 25, 1986) on 07/20/24 at  2:30 PM EDT by a video-enabled telemedicine application and verified that I am speaking with the correct person using two identifiers.  Location: Patient: Virtual Visit  Location Patient: Home Provider: Virtual Visit Location Provider: Home Office   I discussed the limitations of evaluation and management by telemedicine and the availability of in person appointments. The patient expressed understanding and agreed to proceed.    History of Present Illness: [name] is a 38 y.o. who identifies as a female who was assigned female at birth, and is being seen today for neck pain.  HPI: Neck Pain  This is a new problem. The current episode started in the past 7 days (2 days). The problem occurs constantly. The problem has been gradually worsening. The pain is associated with nothing. The pain is present in the midline. The quality of the pain is described as cramping and aching. The pain is mild. Exacerbated by: looking up. The pain is Same all the time. Stiffness is present All day. Associated symptoms include headaches (from concert; now improved). Pertinent negatives include no fever, numbness, pain with swallowing, photophobia, tingling, trouble swallowing, visual change, weakness or weight loss. She has tried bed rest and NSAIDs for the symptoms. The treatment provided no relief.     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     Methylphenidate  Derivatives Other (See Comments)    Reaction:  Depression and anger    Neurontin [Gabapentin] Other (See Comments)    Reaction:  Dizziness    Prozac  [Fluoxetine  Hcl] Other (See Comments)    Reaction:  Anger    Methylphenidate  Hcl     SAME AS PREVIOUS LISTED METHYLPHENIDATE    Medications:  Current Outpatient Medications:    baclofen  (LIORESAL ) 10 MG tablet, Take 1 tablet (10 mg total) by mouth 3 (three) times daily., Disp: 30 each, Rfl: 0   predniSONE  (STERAPRED UNI-PAK 21 TAB) 10 MG (21) TBPK tablet, 6 day taper, take as directed on package instructions, Disp: 21 tablet, Rfl: 0   acetaminophen  (TYLENOL ) 500 MG tablet, Take 2 tablets (1,000 mg total) by mouth every 8 (eight) hours as needed (pain)., Disp: 60 tablet, Rfl: 0   albuterol  (VENTOLIN  HFA) 108 (90 Base) MCG/ACT inhaler, Inhale 2 puffs into the lungs every 6 (six) hours as needed for wheezing or shortness of breath., Disp: 8 g, Rfl: 0   ALPRAZolam  (XANAX ) 1 MG tablet, Take 1 mg by mouth daily as needed., Disp: , Rfl:    ARIPiprazole  (ABILIFY  IM), Inject 300 mg into the muscle every 30 (thirty) days., Disp: , Rfl:    Blood Pressure Monitoring (BLOOD PRESSURE KIT) DEVI, 1 Device by Does not apply route once a week., Disp: 1 each, Rfl: 0   chlorproMAZINE (THORAZINE) 25 MG tablet, Take 25 mg by mouth at bedtime., Disp: , Rfl:    cyclobenzaprine  (FLEXERIL ) 5 MG tablet, Take 1 tablet (5 mg total) by mouth 3 (three) times daily as needed for muscle spasms., Disp: 21 tablet, Rfl: 0   docusate sodium  (COLACE) 100 MG capsule, Take 1 capsule (100 mg total) by mouth 2 (two) times daily., Disp: 30 capsule, Rfl: 0   fluticasone  (FLONASE ) 50 MCG/ACT nasal spray, Place 2 sprays into both nostrils daily., Disp: 16 g, Rfl: 0   fluticasone -salmeterol (ADVAIR HFA) 115-21  MCG/ACT inhaler, Inhale 2 puffs into the lungs 2 (two) times daily., Disp: 1 each, Rfl: 1   ibuprofen  (ADVIL ) 800 MG tablet, Take 1 tablet (800 mg total) by mouth every 8 (eight) hours as needed for moderate pain (pain score 4-6) (take with food)., Disp: 30 tablet, Rfl: 1   loratadine -pseudoephedrine (CLARITIN -D 24 HOUR) 10-240 MG 24 hr tablet, Take 1 tablet by mouth daily., Disp: 30 tablet, Rfl: 0   nystatin  (MYCOSTATIN /NYSTOP ) powder, Apply 1 Application topically 3 (three) times daily., Disp: 60 g, Rfl: 0   nystatin  cream (MYCOSTATIN ), Apply 1 Application topically 2 (two) times daily., Disp: 30 g, Rfl: 1   omeprazole  (PRILOSEC) 20 MG capsule, Take 1 capsule (20 mg total) by mouth daily., Disp: 30 capsule, Rfl: 3   ondansetron  (ZOFRAN -ODT) 8 MG disintegrating tablet, Take 1 tablet (8 mg total) by mouth every 8 (eight) hours as needed for nausea or vomiting., Disp: 20 tablet, Rfl: 0   pantoprazole  (PROTONIX ) 40 MG tablet, Take 1 tablet (40 mg total)  by mouth daily., Disp: 90 tablet, Rfl: 0   propranolol (INDERAL) 20 MG tablet, Take by mouth., Disp: , Rfl:    VRAYLAR 1.5 MG capsule, Take 1.5 mg by mouth daily., Disp: , Rfl:   Observations/Objective: Patient is well-developed, well-nourished in no acute distress.  Resting comfortably at home.  Head is normocephalic, atraumatic.  No labored breathing.  Speech is clear and coherent with logical content.  Patient is alert and oriented at baseline.    Assessment and Plan: 1. Neck muscle spasm (Primary) - baclofen  (LIORESAL ) 10 MG tablet; Take 1 tablet (10 mg total) by mouth 3 (three) times daily.  Dispense: 30 each; Refill: 0 - predniSONE  (STERAPRED UNI-PAK 21 TAB) 10 MG (21) TBPK tablet; 6 day taper, take as directed on package instructions  Dispense: 21 tablet; Refill: 0  - Suspect neck spasm - Failed NSAIDs - Add Medrol  dose pack and flexeril  - Avoid NSAIDs (Ibuprofen /Advil /Motrin  or Naproxen /Aleve ) while on steroid - Tylenol  if okay for  breakthrough pain - Heat to area - Neck exercises and stretches provided via AVS - Seek in person evaluation if worsening or fails to improve with treatment'  Follow Up Instructions: I discussed the assessment and treatment plan with the patient. The patient was provided an opportunity to ask questions and all were answered. The patient agreed with the plan and demonstrated an understanding of the instructions.  A copy of instructions were sent to the patient via MyChart unless otherwise noted below.    The patient was advised to call back or seek an in-person evaluation if the symptoms worsen or if the condition fails to improve as anticipated.    Delon CHRISTELLA Dickinson, PA-C

## 2024-08-19 ENCOUNTER — Telehealth: Admitting: Family

## 2024-08-19 DIAGNOSIS — B372 Candidiasis of skin and nail: Secondary | ICD-10-CM | POA: Diagnosis not present

## 2024-08-20 MED ORDER — NYSTATIN 100000 UNIT/GM EX POWD: 1.0000 | Freq: Three times a day (TID) | CUTANEOUS | 1 refills | Status: DC

## 2024-08-20 MED ORDER — NYSTATIN 100000 UNIT/GM EX CREA: 1.0000 | TOPICAL_CREAM | Freq: Two times a day (BID) | CUTANEOUS | 1 refills | Status: DC

## 2024-08-20 NOTE — Progress Notes (Signed)
 E Visit for Rash  We are sorry that you are not feeling well. Here is how we plan to help!  Based upon your presentation it appears you have a fungal infection.  I have prescribed: and Nystatin  cream apply to the affected area twice daily and I have also sen in nystatin  powder.    HOME CARE:  Take cool showers and avoid direct sunlight. Apply cool compress or wet dressings. Take a bath in an oatmeal bath.  Sprinkle content of one Aveeno packet under running faucet with comfortably warm water.  Bathe for 15-20 minutes, 1-2 times daily.  Pat dry with a towel. Do not rub the rash. Use hydrocortisone cream. Take an antihistamine like Benadryl  for widespread rashes that itch.  The adult dose of Benadryl  is 25-50 mg by mouth 4 times daily. Caution:  This type of medication may cause sleepiness.  Do not drink alcohol, drive, or operate dangerous machinery while taking antihistamines.  Do not take these medications if you have prostate enlargement.  Read package instructions thoroughly on all medications that you take.  GET HELP RIGHT AWAY IF:  Symptoms don't go away after treatment. Severe itching that persists. If you rash spreads or swells. If you rash begins to smell. If it blisters and opens or develops a yellow-brown crust. You develop a fever. You have a sore throat. You become short of breath.  MAKE SURE YOU:  Understand these instructions. Will watch your condition. Will get help right away if you are not doing well or get worse.  Thank you for choosing an e-visit.  Your e-visit answers were reviewed by a board certified advanced clinical practitioner to complete your personal care plan. Depending upon the condition, your plan could have included both over the counter or prescription medications.  Please review your pharmacy choice. Make sure the pharmacy is open so you can pick up prescription now. If there is a problem, you may contact your provider through [employer]  and have the prescription routed to another pharmacy.  Your safety is important to us . If you have drug allergies check your prescription carefully.   For the next 24 hours you can use MyChart to ask questions about today's visit, request a non-urgent call back, or ask for a work or school excuse. You will get an email in the next two days asking about your experience. I hope that your e-visit has been valuable and will speed your recovery.

## 2024-08-20 NOTE — Progress Notes (Signed)
Approximately 5 minutes was spent documenting and reviewing patient's chart.

## 2024-08-25 ENCOUNTER — Telehealth: Admitting: Physician Assistant

## 2024-08-25 DIAGNOSIS — Z20828 Contact with and (suspected) exposure to other viral communicable diseases: Secondary | ICD-10-CM

## 2024-08-25 DIAGNOSIS — J069 Acute upper respiratory infection, unspecified: Secondary | ICD-10-CM

## 2024-08-25 MED ORDER — PROMETHAZINE-DM 6.25-15 MG/5ML PO SYRP: 5.0000 mL | ORAL_SOLUTION | Freq: Three times a day (TID) | ORAL | 0 refills | Status: AC | PRN

## 2024-08-25 MED ORDER — ALBUTEROL SULFATE HFA 108 (90 BASE) MCG/ACT IN AERS: 2.0000 | INHALATION_SPRAY | Freq: Four times a day (QID) | RESPIRATORY_TRACT | 0 refills | Status: AC | PRN

## 2024-08-25 MED ORDER — OSELTAMIVIR PHOSPHATE 75 MG PO CAPS: 75.0000 mg | ORAL_CAPSULE | Freq: Two times a day (BID) | ORAL | 0 refills | Status: AC

## 2024-08-25 NOTE — Progress Notes (Signed)
 Virtual Visit Consent   SHEVY YANEY, you are scheduled for a virtual visit with a [facility] provider today. Just as with appointments in the office, your consent must be obtained to participate. Your consent will be active for this visit and any virtual visit you may have with one of our providers in the next 365 days. If you have a MyChart account, a copy of this consent can be sent to you electronically.  As this is a virtual visit, video technology does not allow for your provider to perform a traditional examination. This may limit your provider's ability to fully assess your condition. If your provider identifies any concerns that need to be evaluated in person or the need to arrange testing (such as labs, EKG, etc.), we will make arrangements to do so. Although advances in technology are sophisticated, we cannot ensure that it will always work on either your end or our end. If the connection with a video visit is poor, the visit may have to be switched to a telephone visit. With either a video or telephone visit, we are not always able to ensure that we have a secure connection.  By engaging in this virtual visit, you consent to the provision of healthcare and authorize for your insurance to be billed (if applicable) for the services provided during this visit. Depending on your insurance coverage, you may receive a charge related to this service.  I need to obtain your verbal consent now. Are you willing to proceed with your visit today? Marlette Curvin Berrian has provided verbal consent on 08/25/2024 for a virtual visit (video or telephone). Lovette Borg, PA-C  Date: 08/25/2024 5:37 PM   Virtual Visit via Video Note   I, Lovette Borg, connected with  KENNY STERN  ([id number], [birth year]) on 08/25/24 at  5:15 PM EDT by a video-enabled telemedicine application and verified that I am speaking with the correct person using two identifiers.  Location: Patient: Virtual Visit Location  Patient: Home Provider: Virtual Visit Location Provider: Home Office   I discussed the limitations of evaluation and management by telemedicine and the availability of in person appointments. The patient expressed understanding and agreed to proceed.    History of Present Illness: ALEIRA DEITER is a 38 y.o. who identifies as a female who was assigned female at birth, and is being seen today for uri symptoms.  HPI: 38y/o F presents for a telehealth video visit for c/o  post nasal drainage, cough, congestion, sore throat x 2 days. +known exposure to Flu to son who was just diagnosed with flu. Pt complains of similar symptoms. +h/o asthma and also complains of wheezing which started earlier today. Hasn't checked her temperature yet.   Cough  URI  Associated symptoms include coughing.    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     Methylphenidate  Derivatives Other (See Comments)    Reaction:  Depression and anger    Neurontin [Gabapentin] Other (See Comments)    Reaction:  Dizziness    Prozac   [Fluoxetine  Hcl] Other (See Comments)    Reaction:  Anger    Methylphenidate  Hcl     SAME AS PREVIOUS LISTED METHYLPHENIDATE    Medications:  Current Outpatient Medications:    albuterol  (VENTOLIN  HFA) 108 (90 Base) MCG/ACT inhaler, Inhale 2 puffs into the lungs every 6 (six) hours as needed for wheezing or shortness of breath., Disp: 8 g, Rfl: 0   oseltamivir  (TAMIFLU ) 75 MG capsule, Take 1 capsule (75 mg total) by mouth 2 (two) times daily for 5 days., Disp: 10 capsule, Rfl: 0   promethazine -dextromethorphan  (PROMETHAZINE -DM) 6.25-15 MG/5ML syrup, Take 5 mLs by mouth every 8 (eight) hours as needed for cough., Disp: 118 mL, Rfl: 0   acetaminophen  (TYLENOL ) 500 MG tablet, Take 2 tablets (1,000 mg total) by mouth every 8 (eight) hours as needed (pain)., Disp: 60 tablet, Rfl: 0   ALPRAZolam  (XANAX ) 1 MG tablet, Take 1 mg by mouth daily as needed., Disp: , Rfl:    ARIPiprazole  (ABILIFY  IM), Inject 300 mg into the muscle every 30 (thirty) days., Disp: , Rfl:    baclofen  (LIORESAL ) 10 MG tablet, Take 1 tablet (10 mg total) by mouth 3 (three) times daily., Disp: 30 each, Rfl: 0   Blood Pressure Monitoring (BLOOD PRESSURE KIT) DEVI, 1 Device by Does not apply route once a week., Disp: 1 each, Rfl: 0   chlorproMAZINE (THORAZINE) 25 MG tablet, Take 25 mg by mouth at bedtime., Disp: , Rfl:    cyclobenzaprine  (FLEXERIL ) 5 MG tablet, Take 1 tablet (5 mg total) by mouth 3 (three) times daily as needed for muscle spasms., Disp: 21 tablet, Rfl: 0   docusate sodium  (COLACE) 100 MG capsule, Take 1 capsule (100 mg total) by mouth 2 (two) times daily., Disp: 30 capsule, Rfl: 0   fluticasone  (FLONASE ) 50 MCG/ACT nasal spray, Place 2 sprays into both nostrils daily., Disp: 16 g, Rfl: 0   fluticasone -salmeterol (ADVAIR HFA) 115-21 MCG/ACT inhaler, Inhale 2 puffs into the lungs 2 (two) times daily., Disp: 1 each, Rfl: 1   ibuprofen  (ADVIL ) 800 MG tablet, Take 1 tablet (800 mg total) by mouth every 8 (eight) hours as  needed for moderate pain (pain score 4-6) (take with food)., Disp: 30 tablet, Rfl: 1   loratadine -pseudoephedrine (CLARITIN -D 24 HOUR) 10-240 MG 24 hr tablet, Take 1 tablet by mouth daily., Disp: 30 tablet, Rfl: 0   nystatin  (MYCOSTATIN /NYSTOP ) powder, Apply 1 Application topically 3 (three) times daily., Disp: 60 g, Rfl: 1   nystatin  cream (MYCOSTATIN ), Apply 1 Application topically 2 (two) times daily., Disp: 60 g, Rfl: 1   omeprazole  (PRILOSEC) 20 MG capsule, Take 1 capsule (20 mg total) by mouth daily., Disp: 30 capsule, Rfl: 3   ondansetron  (ZOFRAN -ODT) 8 MG disintegrating tablet, Take 1 tablet (8 mg total) by mouth every 8 (eight) hours as needed for nausea or vomiting., Disp: 20 tablet, Rfl: 0   pantoprazole  (PROTONIX ) 40 MG tablet, Take 1 tablet (40 mg total) by mouth daily., Disp: 90 tablet, Rfl: 0   predniSONE  (STERAPRED UNI-PAK 21 TAB) 10 MG (21) TBPK tablet, 6 day taper, take as directed on package  instructions, Disp: 21 tablet, Rfl: 0   propranolol (INDERAL) 20 MG tablet, Take by mouth., Disp: , Rfl:    VRAYLAR 1.5 MG capsule, Take 1.5 mg by mouth daily., Disp: , Rfl:   Observations/Objective: Patient is well-developed, well-nourished in no acute distress.  Resting comfortably  at home.  Head is normocephalic, atraumatic.  No labored breathing.  Speech is clear and coherent with logical content.  Patient is alert and oriented at baseline.    Assessment and Plan: 1. Exposure to the flu (Primary) - oseltamivir  (TAMIFLU ) 75 MG capsule; Take 1 capsule (75 mg total) by mouth 2 (two) times daily for 5 days.  Dispense: 10 capsule; Refill: 0  2. URI with cough and congestion - promethazine -dextromethorphan  (PROMETHAZINE -DM) 6.25-15 MG/5ML syrup; Take 5 mLs by mouth every 8 (eight) hours as needed for cough.  Dispense: 118 mL; Refill: 0 - albuterol  (VENTOLIN  HFA) 108 (90 Base) MCG/ACT inhaler; Inhale 2 puffs into the lungs every 6 (six) hours as needed for wheezing or shortness of  breath.  Dispense: 8 g; Refill: 0  Stay well hydrated. Start medicines as prescribed. Discussed with patient to start Tamiflu  withing 72 hours of start of symptoms which is today.  Continue to watch for worsening symptoms. Discussed Tessalon  for cough, but she tells me that her insurance doesn't cover this medicine. Will send a Rx for Promethazine -DM to help her cough.  Schedule a virtual appointment or follow up at an urgent care clinic if symptoms don't improve.  Pt verbalized understanding and in agreement.    Follow Up Instructions: I discussed the assessment and treatment plan with the patient. The patient was provided an opportunity to ask questions and all were answered. The patient agreed with the plan and demonstrated an understanding of the instructions.  A copy of instructions were sent to the patient via MyChart unless otherwise noted below.   Patient has requested to receive PHI (AVS, Work Notes, etc) pertaining to this video visit through e-mail as they are currently without active MyChart. They have voiced understand that email is not considered secure and their health information could be viewed by someone other than the patient.   The patient was advised to call back or seek an in-person evaluation if the symptoms worsen or if the condition fails to improve as anticipated.    Zeppelin Commisso, PA-C

## 2024-08-25 NOTE — Patient Instructions (Signed)
 [employer], thank you for joining [employer], PA-C for today's virtual visit.  While this provider is not your primary care provider (PCP), if your PCP is located in our provider database this encounter information will be shared with them immediately following your visit.   A [facility] MyChart account gives you access to today's visit and all your visits, tests, and labs performed at [facility]  click here if you don't have a [facility] MyChart account or go to [email or web address]  Consent: (Patient) [name] provided verbal consent for this virtual visit at the beginning of the encounter.  Current Medications:  Current Outpatient Medications:    albuterol  (VENTOLIN  HFA) 108 (90 Base) MCG/ACT inhaler, Inhale 2 puffs into the lungs every 6 (six) hours as needed for wheezing or shortness of breath., Disp: 8 g, Rfl: 0   oseltamivir  (TAMIFLU ) 75 MG capsule, Take 1 capsule (75 mg total) by mouth 2 (two) times daily for 5 days., Disp: 10 capsule, Rfl: 0   promethazine -dextromethorphan  (PROMETHAZINE -DM) 6.25-15 MG/5ML syrup, Take 5 mLs by mouth every 8 (eight) hours as needed for cough., Disp: 118 mL, Rfl: 0   acetaminophen  (TYLENOL ) 500 MG tablet, Take 2 tablets (1,000 mg total) by mouth every 8 (eight) hours as needed (pain)., Disp: 60 tablet, Rfl: 0   ALPRAZolam  (XANAX ) 1 MG tablet, Take 1 mg by mouth daily as needed., Disp: , Rfl:    ARIPiprazole  (ABILIFY  IM), Inject 300 mg into the muscle every 30 (thirty) days., Disp: , Rfl:    baclofen  (LIORESAL ) 10 MG tablet, Take 1 tablet (10 mg total) by mouth 3 (three) times daily., Disp: 30 each, Rfl: 0   Blood Pressure Monitoring (BLOOD PRESSURE KIT) DEVI, 1 Device by Does not apply route once a week., Disp: 1 each, Rfl: 0   chlorproMAZINE (THORAZINE) 25 MG tablet, Take 25 mg by mouth at bedtime., Disp: , Rfl:    cyclobenzaprine  (FLEXERIL ) 5 MG tablet, Take 1 tablet (5 mg total) by mouth 3 (three) times daily as  needed for muscle spasms., Disp: 21 tablet, Rfl: 0   docusate sodium  (COLACE) 100 MG capsule, Take 1 capsule (100 mg total) by mouth 2 (two) times daily., Disp: 30 capsule, Rfl: 0   fluticasone  (FLONASE ) 50 MCG/ACT nasal spray, Place 2 sprays into both nostrils daily., Disp: 16 g, Rfl: 0   fluticasone -salmeterol (ADVAIR HFA) 115-21 MCG/ACT inhaler, Inhale 2 puffs into the lungs 2 (two) times daily., Disp: 1 each, Rfl: 1   ibuprofen  (ADVIL ) 800 MG tablet, Take 1 tablet (800 mg total) by mouth every 8 (eight) hours as needed for moderate pain (pain score 4-6) (take with food)., Disp: 30 tablet, Rfl: 1   loratadine -pseudoephedrine (CLARITIN -D 24 HOUR) 10-240 MG 24 hr tablet, Take 1 tablet by mouth daily., Disp: 30 tablet, Rfl: 0   nystatin  (MYCOSTATIN /NYSTOP ) powder, Apply 1 Application topically 3 (three) times daily., Disp: 60 g, Rfl: 1   nystatin  cream (MYCOSTATIN ), Apply 1 Application topically 2 (two) times daily., Disp: 60 g, Rfl: 1   omeprazole  (PRILOSEC) 20 MG capsule, Take 1 capsule (20 mg total) by mouth daily., Disp: 30 capsule, Rfl: 3   ondansetron  (ZOFRAN -ODT) 8 MG disintegrating tablet, Take 1 tablet (8 mg total) by mouth every 8 (eight) hours as needed for nausea or vomiting., Disp: 20 tablet, Rfl: 0   pantoprazole  (PROTONIX ) 40 MG tablet, Take 1 tablet (40 mg total) by mouth daily., Disp: 90 tablet, Rfl: 0   predniSONE  (STERAPRED UNI-PAK 21 TAB)  10 MG (21) TBPK tablet, 6 day taper, take as directed on package instructions, Disp: 21 tablet, Rfl: 0   propranolol (INDERAL) 20 MG tablet, Take by mouth., Disp: , Rfl:    VRAYLAR 1.5 MG capsule, Take 1.5 mg by mouth daily., Disp: , Rfl:    Medications ordered in this encounter:  Meds ordered this encounter  Medications   promethazine -dextromethorphan  (PROMETHAZINE -DM) 6.25-15 MG/5ML syrup    Sig: Take 5 mLs by mouth every 8 (eight) hours as needed for cough.    Dispense:  118 mL    Refill:  0    Supervising Provider:   BLAISE ALEENE KIDD  [[id number]]   oseltamivir  (TAMIFLU ) 75 MG capsule    Sig: Take 1 capsule (75 mg total) by mouth 2 (two) times daily for 5 days.    Dispense:  10 capsule    Refill:  0    Supervising Provider:   LAMPTEY, PHILIP O [[id number]]   albuterol  (VENTOLIN  HFA) 108 (90 Base) MCG/ACT inhaler    Sig: Inhale 2 puffs into the lungs every 6 (six) hours as needed for wheezing or shortness of breath.    Dispense:  8 g    Refill:  0    Supervising Provider:   BLAISE ALEENE KIDD [[id number]]     *If you need refills on other medications prior to your next appointment, please contact your pharmacy*  Follow-Up: Call back or seek an in-person evaluation if the symptoms worsen or if the condition fails to improve as anticipated.  [facility] [phone]  Other Instructions Stay well hydrated. Start medicines as prescribed. Discussed with patient to start Tamiflu  withing 72 hours of start of symptoms which is today.  Continue to watch for worsening symptoms. Discussed Tessalon  for cough, but she tells me that her insurance doesn't cover this medicine. Will send a Rx for Promethazine -DM to help her cough.  Schedule a virtual appointment or follow up at an urgent care clinic if symptoms don't improve.    If you have been instructed to have an in-person evaluation today at a local Urgent Care facility, please use the link below. It will take you to a list of all of our available [facility], including address, phone number and hours of operation. Please do not delay care.  [facility]  If you or a family member do not have a primary care provider, use the link below to schedule a visit and establish care. When you choose a [facility] primary care physician or advanced practice provider, you gain a long-term partner in health. Find a Primary Care Provider  Learn more about [facility]'s in-office and virtual care options: [facility] - Get Care Now

## 2024-09-14 ENCOUNTER — Telehealth: Admitting: Physician Assistant

## 2024-09-14 DIAGNOSIS — Z5321 Procedure and treatment not carried out due to patient leaving prior to being seen by health care provider: Secondary | ICD-10-CM

## 2024-09-14 NOTE — Progress Notes (Signed)
 Patient arrived to video ahead of appt time but left video. Did not return despite provider sending an additional link and waiting for at least 10 minutes from start of appt time.

## 2024-09-23 ENCOUNTER — Telehealth: Admitting: Family Medicine

## 2024-09-23 DIAGNOSIS — L304 Erythema intertrigo: Secondary | ICD-10-CM | POA: Diagnosis not present

## 2024-09-23 DIAGNOSIS — B372 Candidiasis of skin and nail: Secondary | ICD-10-CM

## 2024-09-23 MED ORDER — NYSTATIN 100000 UNIT/GM EX POWD: 1.0000 | Freq: Three times a day (TID) | CUTANEOUS | 1 refills | Status: AC

## 2024-09-23 MED ORDER — NYSTATIN 100000 UNIT/GM EX CREA: 1.0000 | TOPICAL_CREAM | Freq: Two times a day (BID) | CUTANEOUS | 1 refills | Status: AC

## 2024-09-23 NOTE — Progress Notes (Signed)
 Virtual Visit Consent   [name], you are scheduled for a virtual visit with a [facility] provider today. Just as with appointments in the office, your consent must be obtained to participate. Your consent will be active for this visit and any virtual visit you may have with one of our providers in the next 365 days. If you have a MyChart account, a copy of this consent can be sent to you electronically.  As this is a virtual visit, video technology does not allow for your provider to perform a traditional examination. This may limit your provider's ability to fully assess your condition. If your provider identifies any concerns that need to be evaluated in person or the need to arrange testing (such as labs, EKG, etc.), we will make arrangements to do so. Although advances in technology are sophisticated, we cannot ensure that it will always work on either your end or our end. If the connection with a video visit is poor, the visit may have to be switched to a telephone visit. With either a video or telephone visit, we are not always able to ensure that we have a secure connection.  By engaging in this virtual visit, you consent to the provision of healthcare and authorize for your insurance to be billed (if applicable) for the services provided during this visit. Depending on your insurance coverage, you may receive a charge related to this service.  I need to obtain your verbal consent now. Are you willing to proceed with your visit today? [name] has provided verbal consent on 09/23/2024 for a virtual visit (video or telephone). Loa Lamp, FNP  Date: 09/23/2024 7:52 PM   Virtual Visit via Video Note   I, Loa Lamp, connected with  [name]  ([id number], May 02, 1986) on 09/23/24 at  7:45 PM EDT by a video-enabled telemedicine application and verified that I am speaking with the correct person using two identifiers.  Location: Patient: Virtual Visit Location  Patient: Home Provider: Virtual Visit Location Provider: Home Office   I discussed the limitations of evaluation and management by telemedicine and the availability of in person appointments. The patient expressed understanding and agreed to proceed.    History of Present Illness: [name] is a 38 y.o. who identifies as a female who was assigned female at birth, and is being seen today for yeast in skin folds of legs in groin. Red and irritated. She has had a few accidents. 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     Methylphenidate  Derivatives Other (See Comments)    Reaction:  Depression and anger  Neurontin [Gabapentin] Other (See Comments)    Reaction:  Dizziness    Prozac  [Fluoxetine  Hcl] Other (See Comments)    Reaction:  Anger    Methylphenidate  Hcl     SAME AS PREVIOUS LISTED METHYLPHENIDATE    Medications:  Current Outpatient Medications:    acetaminophen  (TYLENOL ) 500 MG tablet, Take 2 tablets (1,000 mg total) by mouth every 8 (eight) hours as needed (pain).,  Disp: 60 tablet, Rfl: 0   albuterol  (VENTOLIN  HFA) 108 (90 Base) MCG/ACT inhaler, Inhale 2 puffs into the lungs every 6 (six) hours as needed for wheezing or shortness of breath., Disp: 8 g, Rfl: 0   ALPRAZolam  (XANAX ) 1 MG tablet, Take 1 mg by mouth daily as needed., Disp: , Rfl:    ARIPiprazole  (ABILIFY  IM), Inject 300 mg into the muscle every 30 (thirty) days., Disp: , Rfl:    baclofen  (LIORESAL ) 10 MG tablet, Take 1 tablet (10 mg total) by mouth 3 (three) times daily., Disp: 30 each, Rfl: 0   Blood Pressure Monitoring (BLOOD PRESSURE KIT) DEVI, 1 Device by Does not apply route once a week., Disp: 1 each, Rfl: 0   chlorproMAZINE (THORAZINE) 25 MG tablet, Take 25 mg by mouth at bedtime., Disp: , Rfl:    cyclobenzaprine  (FLEXERIL ) 5 MG tablet, Take 1 tablet (5 mg total) by mouth 3 (three) times daily as needed for muscle spasms., Disp: 21 tablet, Rfl: 0   docusate sodium  (COLACE) 100 MG capsule, Take 1 capsule (100 mg total) by mouth 2 (two) times daily., Disp: 30 capsule, Rfl: 0   fluticasone  (FLONASE ) 50 MCG/ACT nasal spray, Place 2 sprays into both nostrils daily., Disp: 16 g, Rfl: 0   fluticasone -salmeterol (ADVAIR HFA) 115-21 MCG/ACT inhaler, Inhale 2 puffs into the lungs 2 (two) times daily., Disp: 1 each, Rfl: 1   ibuprofen  (ADVIL ) 800 MG tablet, Take 1 tablet (800 mg total) by mouth every 8 (eight) hours as needed for moderate pain (pain score 4-6) (take with food)., Disp: 30 tablet, Rfl: 1   loratadine -pseudoephedrine (CLARITIN -D 24 HOUR) 10-240 MG 24 hr tablet, Take 1 tablet by mouth daily., Disp: 30 tablet, Rfl: 0   nystatin  (MYCOSTATIN /NYSTOP ) powder, Apply 1 Application topically 3 (three) times daily., Disp: 60 g, Rfl: 1   nystatin  cream (MYCOSTATIN ), Apply 1 Application topically 2 (two) times daily., Disp: 60 g, Rfl: 1   omeprazole  (PRILOSEC) 20 MG capsule, Take 1 capsule (20 mg total) by mouth daily., Disp: 30 capsule, Rfl: 3   ondansetron  (ZOFRAN -ODT) 8 MG disintegrating tablet,  Take 1 tablet (8 mg total) by mouth every 8 (eight) hours as needed for nausea or vomiting., Disp: 20 tablet, Rfl: 0   pantoprazole  (PROTONIX ) 40 MG tablet, Take 1 tablet (40 mg total) by mouth daily., Disp: 90 tablet, Rfl: 0   predniSONE  (STERAPRED UNI-PAK 21 TAB) 10 MG (21) TBPK tablet, 6 day taper, take as directed on package instructions, Disp: 21 tablet, Rfl: 0   promethazine -dextromethorphan  (PROMETHAZINE -DM) 6.25-15 MG/5ML syrup, Take 5 mLs by mouth every 8 (eight) hours as needed for cough., Disp: 118 mL, Rfl: 0   propranolol (INDERAL) 20 MG tablet, Take by mouth., Disp: , Rfl:    VRAYLAR 1.5 MG capsule, Take 1.5 mg by mouth daily., Disp: , Rfl:   Observations/Objective: Patient is well-developed, well-nourished in no acute distress.  Resting comfortably  at home.  Head is normocephalic, atraumatic.  No labored breathing.  Speech is clear and coherent with logical content.  Patient is alert and oriented at baseline.  Assessment and Plan: 1. Intertrigo (Primary)  2. Candidal skin infection - nystatin  (MYCOSTATIN /NYSTOP ) powder; Apply 1 Application topically 3 (three) times daily.  Dispense: 60 g; Refill: 1 - nystatin  cream (MYCOSTATIN ); Apply 1 Application topically 2 (two) times daily.  Dispense: 60 g; Refill: 1  She reports she needs both nystatin  cream and powder. Keep as clean and dry as possible.   Follow Up Instructions: I discussed the assessment and treatment plan with the patient. The patient was provided an opportunity to ask questions and all were answered. The patient agreed with the plan and demonstrated an understanding of the instructions.  A copy of instructions were sent to the patient via MyChart unless otherwise noted below.     The patient was advised to call back or seek an in-person evaluation if the symptoms worsen or if the condition fails to improve as anticipated.    Ketsia Linebaugh, FNP

## 2024-09-23 NOTE — Patient Instructions (Signed)
 Intertrigo Intertrigo is skin irritation or inflammation (dermatitis) that occurs when folds of skin rub together. The irritation can cause a rash and make skin raw and itchy. This condition mostly occurs in the skin folds of these areas: Armpits. Under the breasts. Under the abdomen. Groin. Buttocks. Toes. Intertrigo is not passed from person to person (is not contagious). What are the causes? This condition is caused by heat, moisture, rubbing (friction), and not enough air circulation. The condition can be made worse by: Sweat. Bacteria. A fungus, such as yeast. What increases the risk? This condition is more likely to occur if you have moisture in your skin folds. You are more likely to develop this condition if you: Are not able to move around or are not active. Live in a warm and moist climate. Are not able to control your bowels or bladder (have incontinence). Wear splints, braces, or other medical devices. Are overweight. Have diabetes. What are the signs or symptoms? Symptoms of this condition include: A pink or red skin rash in a skin fold or near a skin fold. Raw or scaly skin. Itchiness or burning. Bleeding. Leaking fluid. A bad smell. How is this diagnosed? This condition is diagnosed with a medical history and physical exam. You may also have a skin swab to test for bacteria or fungus. How is this treated? This condition may be treated by: Cleaning and drying your skin. Taking an antibiotic medicine or using an antibiotic skin cream for a bacterial infection. Using an antifungal cream on your skin or taking pills for an infection that was caused by a fungus, such as yeast. Using a steroid ointment to relieve itchiness and irritation. Separating the skin fold with a clean cotton cloth to absorb moisture and allow air to flow into the area. Follow these instructions at home: Keep the affected area clean and dry. Do not scratch your skin. Stay in a cool  environment as much as possible. Use an air conditioner or fan, if available. Apply over-the-counter and prescription medicines only as told by your health care provider. If you were prescribed antibiotics, use them as told by your health care provider. Do not stop using the antibiotic even if you start to feel better. Keep all follow-up visits. Your health care provider may need to check how well your skin is responding to the treatment. How is this prevented? Shower and dry yourself well after activity or exercise. Use a hair dryer on a cool setting to dry between skin folds, especially after you bathe. Do not wear tight clothes. Wear clothes that are loose, absorbent, and made of cotton. Wear a bra that gives good support, if needed. Protect the skin around your groin and buttocks, especially if you have incontinence. Skin protection includes: Following a regular cleaning routine. Using skin protectant creams, powders, or ointments. Changing protection pads frequently. Maintain a healthy weight. Take care of your feet, especially if you have diabetes. Foot care includes: Wearing shoes that fit well. Keeping your feet dry. Wearing clean, breathable socks. If you have diabetes, keep your blood sugar under control. Contact a health care provider if: Your symptoms do not improve with treatment. Your symptoms get worse or they spread. You notice increased redness and warmth. You have a fever. This information is not intended to replace advice given to you by your health care provider. Make sure you discuss any questions you have with your health care provider. Document Revised: 04/24/2022 Document Reviewed: 04/24/2022 Elsevier Patient Education  2024 [employer]

## 2024-10-08 ENCOUNTER — Telehealth: Admitting: Physician Assistant

## 2024-10-08 DIAGNOSIS — N946 Dysmenorrhea, unspecified: Secondary | ICD-10-CM

## 2024-10-08 MED ORDER — IBUPROFEN 800 MG PO TABS
800.0000 mg | ORAL_TABLET | Freq: Three times a day (TID) | ORAL | 1 refills | Status: AC | PRN
Start: 2024-10-08 — End: ?

## 2024-10-08 NOTE — Progress Notes (Signed)
 Virtual Visit Consent   [name], you are scheduled for a virtual visit with a [facility] provider today. Just as with appointments in the office, your consent must be obtained to participate. Your consent will be active for this visit and any virtual visit you may have with one of our providers in the next 365 days. If you have a MyChart account, a copy of this consent can be sent to you electronically.  As this is a virtual visit, video technology does not allow for your provider to perform a traditional examination. This may limit your provider's ability to fully assess your condition. If your provider identifies any concerns that need to be evaluated in person or the need to arrange testing (such as labs, EKG, etc.), we will make arrangements to do so. Although advances in technology are sophisticated, we cannot ensure that it will always work on either your end or our end. If the connection with a video visit is poor, the visit may have to be switched to a telephone visit. With either a video or telephone visit, we are not always able to ensure that we have a secure connection.  By engaging in this virtual visit, you consent to the provision of healthcare and authorize for your insurance to be billed (if applicable) for the services provided during this visit. Depending on your insurance coverage, you may receive a charge related to this [name].  I need to obtain your verbal consent now. Are you willing to proceed with your visit today? [name] has provided verbal consent on 10/08/2024 for a virtual visit (video or telephone). [name], [name]  Date: 10/08/2024 12:02 PM   Virtual Visit via Video Note   I, [name], connected with  [name]  ([id number], 02-05-1986) on 10/08/24 at 12:00 PM EDT by a video-enabled telemedicine application and verified that I am speaking with the correct person using two identifiers.  Location: Patient: Virtual Visit  Location Patient: Home Provider: Virtual Visit Location Provider: Home Office   I discussed the limitations of evaluation and management by telemedicine and the availability of in person appointments. The patient expressed understanding and agreed to proceed.    History of Present Illness: [name] is a 38 y.o. who identifies as a female who was assigned female at birth, and is being seen today for abdominal cramps.  HPI: Abdominal Pain This is a recurrent problem. The quality of the pain is cramping. The abdominal pain radiates to the suprapubic region. Associated symptoms comments: menstruation. The pain is relieved by Being still. She has tried nothing for the symptoms.    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     Methylphenidate  Derivatives Other (See Comments)    Reaction:  Depression and anger    Neurontin [Gabapentin] Other (See Comments)    Reaction:  Dizziness    Prozac  [Fluoxetine  Hcl] Other (See Comments)    Reaction:  Anger    Methylphenidate  Hcl     SAME AS PREVIOUS  LISTED METHYLPHENIDATE    Medications:  Current Outpatient Medications:    acetaminophen  (TYLENOL ) 500 MG tablet, Take 2 tablets (1,000 mg total) by mouth every 8 (eight) hours as needed (pain)., Disp: 60 tablet, Rfl: 0   albuterol  (VENTOLIN  HFA) 108 (90 Base) MCG/ACT inhaler, Inhale 2 puffs into the lungs every 6 (six) hours as needed for wheezing or shortness of breath., Disp: 8 g, Rfl: 0   ALPRAZolam  (XANAX ) 1 MG tablet, Take 1 mg by mouth daily as needed., Disp: , Rfl:    ARIPiprazole  (ABILIFY  IM), Inject 300 mg into the muscle every 30 (thirty) days., Disp: , Rfl:    baclofen  (LIORESAL ) 10 MG tablet, Take 1 tablet (10 mg total) by mouth 3 (three) times daily., Disp: 30 each, Rfl: 0   Blood Pressure Monitoring (BLOOD PRESSURE KIT) DEVI, 1 Device by Does not apply route once a week., Disp: 1 each, Rfl: 0   chlorproMAZINE (THORAZINE) 25 MG tablet, Take 25 mg by mouth at bedtime., Disp: , Rfl:    cyclobenzaprine  (FLEXERIL ) 5 MG tablet, Take 1 tablet (5 mg total) by mouth 3 (three) times daily as needed for muscle spasms., Disp: 21 tablet, Rfl: 0   docusate sodium  (COLACE) 100 MG capsule, Take 1 capsule (100 mg total) by mouth 2 (two) times daily., Disp: 30 capsule, Rfl: 0   fluticasone  (FLONASE ) 50 MCG/ACT nasal spray, Place 2 sprays into both nostrils daily., Disp: 16 g, Rfl: 0   fluticasone -salmeterol (ADVAIR HFA) 115-21 MCG/ACT inhaler, Inhale 2 puffs into the lungs 2 (two) times daily., Disp: 1 each, Rfl: 1   ibuprofen  (ADVIL ) 800 MG tablet, Take 1 tablet (800 mg total) by mouth every 8 (eight) hours as needed for moderate pain (pain score 4-6) (take with food)., Disp: 30 tablet, Rfl: 1   loratadine -pseudoephedrine (CLARITIN -D 24 HOUR) 10-240 MG 24 hr tablet, Take 1 tablet by mouth daily., Disp: 30 tablet, Rfl: 0   nystatin  (MYCOSTATIN /NYSTOP ) powder, Apply 1 Application topically 3 (three) times daily., Disp: 60 g, Rfl: 1   nystatin  cream (MYCOSTATIN ), Apply 1 Application topically 2 (two)  times daily., Disp: 60 g, Rfl: 1   omeprazole  (PRILOSEC) 20 MG capsule, Take 1 capsule (20 mg total) by mouth daily., Disp: 30 capsule, Rfl: 3   ondansetron  (ZOFRAN -ODT) 8 MG disintegrating tablet, Take 1 tablet (8 mg total) by mouth every 8 (eight) hours as needed for nausea or vomiting., Disp: 20 tablet, Rfl: 0   pantoprazole  (PROTONIX ) 40 MG tablet, Take 1 tablet (40 mg total) by mouth daily., Disp: 90 tablet, Rfl: 0   predniSONE  (STERAPRED UNI-PAK 21 TAB) 10 MG (21) TBPK tablet, 6 day taper, take as directed on package instructions, Disp: 21 tablet, Rfl: 0   promethazine -dextromethorphan  (PROMETHAZINE -DM) 6.25-15 MG/5ML syrup, Take 5 mLs by mouth every 8 (eight) hours as needed for cough., Disp: 118 mL, Rfl: 0   propranolol (INDERAL) 20 MG tablet, Take by mouth., Disp: , Rfl:    VRAYLAR 1.5 MG capsule, Take 1.5 mg by mouth daily., Disp: , Rfl:   Observations/Objective: Patient is well-developed, well-nourished in no acute distress.  Resting comfortably  at home.  Head is normocephalic, atraumatic.  No labored breathing. Speech is clear and coherent with logical content.  Patient is alert and oriented at baseline.    Assessment and Plan: 1. Menstrual cramps  Differential diagnosis considered including diarrhea, nausea, vomiting, gastroenteritis, food poisoning, infectious diarrhea, antibiotic associated diarrhea, abdominal  gas cramping, dehydration, appendicitis, bowel obstruction, volvulus, inflammatory bowel disease.  These were thought to be less likely given the history.  Based on history and exam recommended, Rest, fluids, and over-the-counter symptomatic treatments were recommended. Monitor for new or worsening symptoms.  Follow Up Instructions: I discussed the assessment and treatment plan with the patient. The patient was provided an opportunity to ask questions and all were answered. The patient agreed with the plan and demonstrated an understanding of the instructions.  A copy  of instructions were sent to the patient via MyChart unless otherwise noted below.     The patient was advised to call back or seek an in-person evaluation if the symptoms worsen or if the condition fails to improve as anticipated.    [name], PA-C

## 2024-10-08 NOTE — Patient Instructions (Signed)
 [employer], thank you for joining [name], PA-C for today's virtual visit.  While this provider is not your primary care provider (PCP), if your PCP is located in our provider database this encounter information will be shared with them immediately following your visit.   A [facility] MyChart account gives you access to today's visit and all your visits, tests, and labs performed at [facility]  click here if you don't have a  MyChart account or go to [email or web address]  Consent: (Patient) [name] provided verbal consent for this virtual visit at the beginning of the encounter.  Current Medications:  Current Outpatient Medications:    acetaminophen  (TYLENOL ) 500 MG tablet, Take 2 tablets (1,000 mg total) by mouth every 8 (eight) hours as needed (pain)., Disp: 60 tablet, Rfl: 0   albuterol  (VENTOLIN  HFA) 108 (90 Base) MCG/ACT inhaler, Inhale 2 puffs into the lungs every 6 (six) hours as needed for wheezing or shortness of breath., Disp: 8 g, Rfl: 0   ALPRAZolam  (XANAX ) 1 MG tablet, Take 1 mg by mouth daily as needed., Disp: , Rfl:    ARIPiprazole  (ABILIFY  IM), Inject 300 mg into the muscle every 30 (thirty) days., Disp: , Rfl:    baclofen  (LIORESAL ) 10 MG tablet, Take 1 tablet (10 mg total) by mouth 3 (three) times daily., Disp: 30 each, Rfl: 0   Blood Pressure Monitoring (BLOOD PRESSURE KIT) DEVI, 1 Device by Does not apply route once a week., Disp: 1 each, Rfl: 0   chlorproMAZINE (THORAZINE) 25 MG tablet, Take 25 mg by mouth at bedtime., Disp: , Rfl:    cyclobenzaprine  (FLEXERIL ) 5 MG tablet, Take 1 tablet (5 mg total) by mouth 3 (three) times daily as needed for muscle spasms., Disp: 21 tablet, Rfl: 0   docusate sodium  (COLACE) 100 MG capsule, Take 1 capsule (100 mg total) by mouth 2 (two) times daily., Disp: 30 capsule, Rfl: 0   fluticasone  (FLONASE ) 50 MCG/ACT nasal spray, Place 2 sprays into both nostrils daily., Disp: 16 g, Rfl: 0    fluticasone -salmeterol (ADVAIR HFA) 115-21 MCG/ACT inhaler, Inhale 2 puffs into the lungs 2 (two) times daily., Disp: 1 each, Rfl: 1   ibuprofen  (ADVIL ) 800 MG tablet, Take 1 tablet (800 mg total) by mouth every 8 (eight) hours as needed for moderate pain (pain score 4-6) (take with food)., Disp: 30 tablet, Rfl: 1   loratadine -pseudoephedrine (CLARITIN -D 24 HOUR) 10-240 MG 24 hr tablet, Take 1 tablet by mouth daily., Disp: 30 tablet, Rfl: 0   nystatin  (MYCOSTATIN /NYSTOP ) powder, Apply 1 Application topically 3 (three) times daily., Disp: 60 g, Rfl: 1   nystatin  cream (MYCOSTATIN ), Apply 1 Application topically 2 (two) times daily., Disp: 60 g, Rfl: 1   omeprazole  (PRILOSEC) 20 MG capsule, Take 1 capsule (20 mg total) by mouth daily., Disp: 30 capsule, Rfl: 3   ondansetron  (ZOFRAN -ODT) 8 MG disintegrating tablet, Take 1 tablet (8 mg total) by mouth every 8 (eight) hours as needed for nausea or vomiting., Disp: 20 tablet, Rfl: 0   pantoprazole  (PROTONIX ) 40 MG tablet, Take 1 tablet (40 mg total) by mouth daily., Disp: 90 tablet, Rfl: 0   predniSONE  (STERAPRED UNI-PAK 21 TAB) 10 MG (21) TBPK tablet, 6 day taper, take as directed on package instructions, Disp: 21 tablet, Rfl: 0   promethazine -dextromethorphan  (PROMETHAZINE -DM) 6.25-15 MG/5ML syrup, Take 5 mLs by mouth every 8 (eight) hours as needed for cough., Disp: 118 mL, Rfl: 0   propranolol (INDERAL) 20 MG tablet, Take  by mouth., Disp: , Rfl:    VRAYLAR 1.5 MG capsule, Take 1.5 mg by mouth daily., Disp: , Rfl:    Medications ordered in this encounter:  No orders of the defined types were placed in this encounter.    *If you need refills on other medications prior to your next appointment, please contact your pharmacy*  Follow-Up: Call back or seek an in-person evaluation if the symptoms worsen or if the condition fails to improve as anticipated.  [facility] [phone]  Other Instructions Please report to the nearest  Emergency room with any worsening symptoms. Follow up with primary care provider (PCP) in 2 -3 days.    If you have been instructed to have an in-person evaluation today at a local Urgent Care facility, please use the link below. It will take you to a list of all of our available [facility], including address, phone number and hours of operation. Please do not delay care.  [facility]  If you or a family member do not have a primary care provider, use the link below to schedule a visit and establish care. When you choose a [facility] primary care physician or advanced practice provider, you gain a long-term partner in health. Find a Primary Care Provider  Learn more about [facility]'s in-office and virtual care options: [facility] - Get Care Now

## 2024-10-13 ENCOUNTER — Encounter: Admitting: Obstetrics

## 2025-01-11 ENCOUNTER — Ambulatory Visit

## 2025-01-18 ENCOUNTER — Telehealth: Admitting: Physician Assistant

## 2025-01-18 DIAGNOSIS — N750 Cyst of Bartholin's gland: Secondary | ICD-10-CM

## 2025-01-18 MED ORDER — DOXYCYCLINE HYCLATE 100 MG PO TABS: 100.0000 mg | ORAL_TABLET | Freq: Two times a day (BID) | ORAL | 0 refills | Status: AC

## 2025-01-18 MED ORDER — DOXYCYCLINE HYCLATE 100 MG PO TABS: 100.0000 mg | ORAL_TABLET | Freq: Two times a day (BID) | ORAL | 0 refills | Status: DC

## 2025-01-18 NOTE — Progress Notes (Signed)
 " Virtual Visit Consent   [name], you are scheduled for a virtual visit with a [facility] provider today. Just as with appointments in the office, your consent must be obtained to participate. Your consent will be active for this visit and any virtual visit you may have with one of our providers in the next 365 days. If you have a MyChart account, a copy of this consent can be sent to you electronically.  As this is a virtual visit, video technology does not allow for your provider to perform a traditional examination. This may limit your provider's ability to fully assess your condition. If your provider identifies any concerns that need to be evaluated in person or the need to arrange testing (such as labs, EKG, etc.), we will make arrangements to do so. Although advances in technology are sophisticated, we cannot ensure that it will always work on either your end or our end. If the connection with a video visit is poor, the visit may have to be switched to a telephone visit. With either a video or telephone visit, we are not always able to ensure that we have a secure connection.  By engaging in this virtual visit, you consent to the provision of healthcare and authorize for your insurance to be billed (if applicable) for the services provided during this visit. Depending on your insurance coverage, you may receive a charge related to this service.  I need to obtain your verbal consent now. Are you willing to proceed with your visit today? [name] has provided verbal consent on 01/18/2025 for a virtual visit (video or telephone). Nichole CHRISTELLA Dickinson, PA-C  Date: 01/18/2025 5:18 PM   Virtual Visit via Video Note   I, [name], connected with  [name]  ([id number], 02/13/1986) on 01/18/25 at  4:30 PM EST by a video-enabled telemedicine application and verified that I am speaking with the correct person using two identifiers.  Location: Patient: Virtual Visit  Location Patient: Home Provider: Virtual Visit Location Provider: Home Office   I discussed the limitations of evaluation and management by telemedicine and the availability of in person appointments. The patient expressed understanding and agreed to proceed.    History of Present Illness: [name] is a 39 y.o. who identifies as a female who was assigned female at birth, and is being seen today for boil in vaginal area. Noticed a couple of days ago and has been worsening. It is swollen, more tender to touch. Denies drainage, fevers, chills, nausea, and vomiting.   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  Resting comfortably at home.  Head is normocephalic, atraumatic.  No labored breathing.  Speech is clear and  coherent with logical content.  Patient is alert and oriented at baseline.    Assessment and Plan: 1. Bartholin cyst (Primary) - doxycycline  (VIBRA -TABS) 100 MG tablet; Take 1 tablet (100 mg total) by mouth 2 (two) times daily.  Dispense: 14 tablet; Refill: 0  - Worsening - Add  Doxycycline  - Warm compresses - Epsom salt soaks - Clean with warm soap and water daily - Keep area clean and dry - Seek in person evaluation if not improving or if worsening  Follow Up Instructions: I discussed the assessment and treatment plan with the patient. The patient was provided an opportunity to ask questions and all were answered. The patient agreed with the plan and demonstrated an understanding of the instructions.  A copy of instructions were sent to the patient via MyChart unless otherwise noted below.    The patient was advised to call back or seek an in-person evaluation if the symptoms worsen or if the condition fails to improve as anticipated.    Nichole CHRISTELLA Dickinson, PA-C     [1]  Allergies Allergen Reactions   Depakote [Divalproex Sodium] Other (See Comments)    Pt states that this medication makes her blood levels toxic.     Methylphenidate  Derivatives Other (See Comments)    Reaction:  Depression and anger    Neurontin [Gabapentin] Other (See Comments)    Reaction:  Dizziness    Prozac  [Fluoxetine  Hcl] Other (See Comments)    Reaction:  Anger    Methylphenidate  Hcl     SAME AS PREVIOUS LISTED METHYLPHENIDATE   [2]  Current Outpatient Medications:    acetaminophen  (TYLENOL ) 500 MG tablet, Take 2 tablets (1,000 mg total) by mouth every 8 (eight) hours as needed (pain)., Disp: 60 tablet, Rfl: 0   albuterol  (VENTOLIN  HFA) 108 (90 Base) MCG/ACT inhaler, Inhale 2 puffs into the lungs every 6 (six) hours as needed for wheezing or shortness of breath., Disp: 8 g, Rfl: 0   ALPRAZolam  (XANAX ) 1 MG tablet, Take 1 mg by mouth daily as needed., Disp: , Rfl:    ARIPiprazole  (ABILIFY  IM), Inject 300 mg into the muscle every 30 (thirty) days., Disp: , Rfl:    baclofen  (LIORESAL ) 10 MG tablet, Take 1 tablet (10 mg total) by mouth 3 (three) times daily., Disp: 30 each, Rfl: 0   Blood Pressure Monitoring (BLOOD PRESSURE KIT) DEVI, 1 Device by Does not apply route once a week.,  Disp: 1 each, Rfl: 0   chlorproMAZINE (THORAZINE) 25 MG tablet, Take 25 mg by mouth at bedtime., Disp: , Rfl:    cyclobenzaprine  (FLEXERIL ) 5 MG tablet, Take 1 tablet (5 mg total) by mouth 3 (three) times daily as needed for muscle spasms., Disp: 21 tablet, Rfl: 0   docusate sodium  (COLACE) 100 MG capsule, Take 1 capsule (100 mg total) by mouth 2 (two) times daily., Disp: 30 capsule, Rfl: 0   doxycycline  (VIBRA -TABS) 100 MG tablet, Take 1 tablet (100 mg total) by mouth 2 (two) times daily., Disp: 14 tablet, Rfl: 0   fluticasone  (FLONASE ) 50 MCG/ACT nasal spray, Place 2 sprays into both nostrils daily., Disp: 16 g, Rfl: 0   fluticasone -salmeterol (ADVAIR HFA) 115-21 MCG/ACT inhaler, Inhale 2 puffs into the lungs 2 (two) times daily., Disp: 1 each, Rfl: 1   ibuprofen  (ADVIL ) 800 MG tablet, Take 1 tablet (800 mg total) by mouth every 8 (eight) hours as needed for moderate pain (pain score 4-6) (take with food)., Disp: 30 tablet, Rfl: 1   loratadine -pseudoephedrine (CLARITIN -D 24 HOUR)  10-240 MG 24 hr tablet, Take 1 tablet by mouth daily., Disp: 30 tablet, Rfl: 0   nystatin  (MYCOSTATIN /NYSTOP ) powder, Apply 1 Application topically 3 (three) times daily., Disp: 60 g, Rfl: 1   nystatin  cream (MYCOSTATIN ), Apply 1 Application topically 2 (two) times daily., Disp: 60 g, Rfl: 1   omeprazole  (PRILOSEC) 20 MG capsule, Take 1 capsule (20 mg total) by mouth daily., Disp: 30 capsule, Rfl: 3   ondansetron  (ZOFRAN -ODT) 8 MG disintegrating tablet, Take 1 tablet (8 mg total) by mouth every 8 (eight) hours as needed for nausea or vomiting., Disp: 20 tablet, Rfl: 0   pantoprazole  (PROTONIX ) 40 MG tablet, Take 1 tablet (40 mg total) by mouth daily., Disp: 90 tablet, Rfl: 0   predniSONE  (STERAPRED UNI-PAK 21 TAB) 10 MG (21) TBPK tablet, 6 day taper, take as directed on package instructions, Disp: 21 tablet, Rfl: 0   promethazine -dextromethorphan  (PROMETHAZINE -DM) 6.25-15 MG/5ML syrup, Take 5 mLs by mouth every 8 (eight)  hours as needed for cough., Disp: 118 mL, Rfl: 0   propranolol (INDERAL) 20 MG tablet, Take by mouth., Disp: , Rfl:    VRAYLAR 1.5 MG capsule, Take 1.5 mg by mouth daily., Disp: , Rfl:   "

## 2025-01-18 NOTE — Patient Instructions (Signed)
 " [name], thank you for joining Delon CHRISTELLA Dickinson, PA-C for today's virtual visit.  While this provider is not your primary care provider (PCP), if your PCP is located in our provider database this encounter information will be shared with them immediately following your visit.   A [facility] MyChart account gives you access to today's visit and all your visits, tests, and labs performed at [facility]  click here if you don't have a [facility] MyChart account or go to [email or web address]  Consent: (Patient) [name] provided verbal consent for this virtual visit at the beginning of the encounter.  Current Medications:  Current Outpatient Medications:    acetaminophen  (TYLENOL ) 500 MG tablet, Take 2 tablets (1,000 mg total) by mouth every 8 (eight) hours as needed (pain)., Disp: 60 tablet, Rfl: 0   albuterol  (VENTOLIN  HFA) 108 (90 Base) MCG/ACT inhaler, Inhale 2 puffs into the lungs every 6 (six) hours as needed for wheezing or shortness of breath., Disp: 8 g, Rfl: 0   ALPRAZolam  (XANAX ) 1 MG tablet, Take 1 mg by mouth daily as needed., Disp: , Rfl:    ARIPiprazole  (ABILIFY  IM), Inject 300 mg into the muscle every 30 (thirty) days., Disp: , Rfl:    baclofen  (LIORESAL ) 10 MG tablet, Take 1 tablet (10 mg total) by mouth 3 (three) times daily., Disp: 30 each, Rfl: 0   Blood Pressure Monitoring (BLOOD PRESSURE KIT) DEVI, 1 Device by Does not apply route once a week., Disp: 1 each, Rfl: 0   chlorproMAZINE (THORAZINE) 25 MG tablet, Take 25 mg by mouth at bedtime., Disp: , Rfl:    cyclobenzaprine  (FLEXERIL ) 5 MG tablet, Take 1 tablet (5 mg total) by mouth 3 (three) times daily as needed for muscle spasms., Disp: 21 tablet, Rfl: 0   docusate sodium  (COLACE) 100 MG capsule, Take 1 capsule (100 mg total) by mouth 2 (two) times daily., Disp: 30 capsule, Rfl: 0   doxycycline  (VIBRA -TABS) 100 MG tablet, Take 1 tablet (100 mg total) by mouth 2 (two) times daily.,  Disp: 14 tablet, Rfl: 0   fluticasone  (FLONASE ) 50 MCG/ACT nasal spray, Place 2 sprays into both nostrils daily., Disp: 16 g, Rfl: 0   fluticasone -salmeterol (ADVAIR HFA) 115-21 MCG/ACT inhaler, Inhale 2 puffs into the lungs 2 (two) times daily., Disp: 1 each, Rfl: 1   ibuprofen  (ADVIL ) 800 MG tablet, Take 1 tablet (800 mg total) by mouth every 8 (eight) hours as needed for moderate pain (pain score 4-6) (take with food)., Disp: 30 tablet, Rfl: 1   loratadine -pseudoephedrine (CLARITIN -D 24 HOUR) 10-240 MG 24 hr tablet, Take 1 tablet by mouth daily., Disp: 30 tablet, Rfl: 0   nystatin  (MYCOSTATIN /NYSTOP ) powder, Apply 1 Application topically 3 (three) times daily., Disp: 60 g, Rfl: 1   nystatin  cream (MYCOSTATIN ), Apply 1 Application topically 2 (two) times daily., Disp: 60 g, Rfl: 1   omeprazole  (PRILOSEC) 20 MG capsule, Take 1 capsule (20 mg total) by mouth daily., Disp: 30 capsule, Rfl: 3   ondansetron  (ZOFRAN -ODT) 8 MG disintegrating tablet, Take 1 tablet (8 mg total) by mouth every 8 (eight) hours as needed for nausea or vomiting., Disp: 20 tablet, Rfl: 0   pantoprazole  (PROTONIX ) 40 MG tablet, Take 1 tablet (40 mg total) by mouth daily., Disp: 90 tablet, Rfl: 0   predniSONE  (STERAPRED UNI-PAK 21 TAB) 10 MG (21) TBPK tablet, 6 day taper, take as directed on package instructions, Disp: 21 tablet, Rfl: 0   promethazine -dextromethorphan  (PROMETHAZINE -DM) 6.25-15 MG/5ML syrup,  Take 5 mLs by mouth every 8 (eight) hours as needed for cough., Disp: 118 mL, Rfl: 0   propranolol (INDERAL) 20 MG tablet, Take by mouth., Disp: , Rfl:    VRAYLAR 1.5 MG capsule, Take 1.5 mg by mouth daily., Disp: , Rfl:    Medications ordered in this encounter:  Meds ordered this encounter  Medications   DISCONTD: doxycycline  (VIBRA -TABS) 100 MG tablet    Sig: Take 1 tablet (100 mg total) by mouth 2 (two) times daily.    Dispense:  14 tablet    Refill:  0    Supervising Provider:   BLAISE ALEENE KIDD [[id number]]    doxycycline  (VIBRA -TABS) 100 MG tablet    Sig: Take 1 tablet (100 mg total) by mouth 2 (two) times daily.    Dispense:  14 tablet    Refill:  0    Supervising Provider:   BLAISE ALEENE KIDD [[id number]]     *If you need refills on other medications prior to your next appointment, please contact your pharmacy*  Follow-Up: Call back or seek an in-person evaluation if the symptoms worsen or if the condition fails to improve as anticipated.     Other Instructions Pocket of Fluid at the Base of the Labia (Bartholin's Cyst): What to Know  A Bartholin's cyst is a pocket of fluid that forms on a Bartholin's gland. Bartholin's glands are small glands in the labia. The labia are folds of skin on the sides of the vaginal opening. This type of cyst causes a bulge or lump near the opening of the vagina. If you have a cyst that's small and not infected, you may be able to take care of it at home. If your cyst gets infected, it may cause pain. Your doctor may need to drain it. If you're older than 38 years old, you should talk with your doctor about whether some of the cyst or area around it should be taken out and checked for problems. What are the causes? The cyst may be caused by a blocked Bartholin's gland. Germs inside of the cyst can cause an infection. In many cases, the cause isn't known. What are the signs or symptoms? Symptoms may include: A bulge or lump near the opening of the vagina. Discomfort or pain that gets worse when you: Have sex. Walk. Sit. Redness, swelling, or fluid draining from the area. These are signs of an abscess. Other worse symptoms may develop based on: The size of your cyst. Whether you have an abscess from infection. How is this treated? You may not need treatment if your cyst isn't causing symptoms. The cyst can go away on its own with hot baths or warm packs. Large cysts or cysts that are infected may be treated with: Antibiotics. A procedure to drain the  fluid. Cysts that keep coming back will need to be drained many times. Your doctor may talk with you about surgery to take out the cyst. Follow these instructions at home: Medicines Take your medicines only as told. If you were given antibiotics, take them as told. Do not stop taking them even if you start to feel better. Managing pain and swelling Use heat as told. Use the heat source that your doctor recommends, such as a moist heat pack or a heating pad. Do this as often as told. Place a towel between your skin and the heat source. Leave the heat on for 20-30 minutes. If your skin turns red, take off the heat right away to  prevent burns. The risk of burns is higher if you can't feel pain, heat, or cold. General instructions Try sitz baths to help with pain and swelling. A sitz bath is a warm water bath. The water should only come up to your hips and should cover your butt. You may take sitz baths several times a day. Do not push on or squeeze your cyst. Do not have sex until the cyst has gone away or your wound from cyst drainage has healed. Take these steps to help prevent a cyst from coming back and to prevent other cysts from forming: Take a bath or shower once a day. Clean your vaginal area with mild soap and water. Practice safe sex to prevent STIs. Talk to your doctor about how to protect yourself from STIs. Ask your doctor which forms of birth control are best for you. Contact a doctor if: You have a fever. You have a cyst that gets larger or a cyst that comes back. You have new symptoms. Your symptoms get worse. This information is not intended to replace advice given to you by your health care provider. Make sure you discuss any questions you have with your health care provider. Document Revised: 09/14/2023 Document Reviewed: 09/14/2023 Elsevier Patient Education  [employer].   If you have been instructed to have an in-person evaluation today at a local Urgent Care  facility, please use the link below. It will take you to a list of all of our available [facility], including address, phone number and hours of operation. Please do not delay care.  [facility]  If you or a family member do not have a primary care provider, use the link below to schedule a visit and establish care. When you choose a [facility] primary care physician or advanced practice provider, you gain a long-term partner in health. Find a Primary Care Provider  Learn more about [facility] in-office and virtual care options: [facility] - Get Care Now "

## 2025-02-16 ENCOUNTER — Ambulatory Visit
